# Patient Record
Sex: Male | Born: 1935 | Race: Black or African American | Hispanic: No | Smoking: Former smoker
Health system: Southern US, Community
[De-identification: ages and names within clinical notes are randomized; demographics above are authoritative.]

## PROBLEM LIST (undated history)

## (undated) DIAGNOSIS — D649 Anemia, unspecified: Secondary | ICD-10-CM

## (undated) DIAGNOSIS — M4800 Spinal stenosis, site unspecified: Secondary | ICD-10-CM

## (undated) DIAGNOSIS — M21372 Foot drop, left foot: Secondary | ICD-10-CM

## (undated) DIAGNOSIS — N184 Chronic kidney disease, stage 4 (severe): Secondary | ICD-10-CM

## (undated) DIAGNOSIS — R7989 Other specified abnormal findings of blood chemistry: Secondary | ICD-10-CM

## (undated) DIAGNOSIS — R339 Retention of urine, unspecified: Secondary | ICD-10-CM

## (undated) DIAGNOSIS — M1990 Unspecified osteoarthritis, unspecified site: Secondary | ICD-10-CM

## (undated) DIAGNOSIS — E119 Type 2 diabetes mellitus without complications: Secondary | ICD-10-CM

## (undated) DIAGNOSIS — I10 Essential (primary) hypertension: Secondary | ICD-10-CM

## (undated) DIAGNOSIS — E871 Hypo-osmolality and hyponatremia: Secondary | ICD-10-CM

## (undated) DIAGNOSIS — I495 Sick sinus syndrome: Secondary | ICD-10-CM

## (undated) DIAGNOSIS — Z7901 Long term (current) use of anticoagulants: Secondary | ICD-10-CM

## (undated) DIAGNOSIS — I779 Disorder of arteries and arterioles, unspecified: Secondary | ICD-10-CM

## (undated) DIAGNOSIS — E1142 Type 2 diabetes mellitus with diabetic polyneuropathy: Secondary | ICD-10-CM

## (undated) DIAGNOSIS — J189 Pneumonia, unspecified organism: Secondary | ICD-10-CM

## (undated) DIAGNOSIS — I480 Paroxysmal atrial fibrillation: Secondary | ICD-10-CM

## (undated) DIAGNOSIS — I5032 Chronic diastolic (congestive) heart failure: Secondary | ICD-10-CM

## (undated) DIAGNOSIS — I739 Peripheral vascular disease, unspecified: Secondary | ICD-10-CM

## (undated) DIAGNOSIS — M722 Plantar fascial fibromatosis: Secondary | ICD-10-CM

## (undated) DIAGNOSIS — R269 Unspecified abnormalities of gait and mobility: Secondary | ICD-10-CM

## (undated) DIAGNOSIS — I639 Cerebral infarction, unspecified: Secondary | ICD-10-CM

## (undated) DIAGNOSIS — I119 Hypertensive heart disease without heart failure: Secondary | ICD-10-CM

## (undated) DIAGNOSIS — I272 Pulmonary hypertension, unspecified: Secondary | ICD-10-CM

## (undated) DIAGNOSIS — E785 Hyperlipidemia, unspecified: Secondary | ICD-10-CM

## (undated) DIAGNOSIS — R278 Other lack of coordination: Secondary | ICD-10-CM

## (undated) DIAGNOSIS — Z950 Presence of cardiac pacemaker: Secondary | ICD-10-CM

## (undated) HISTORY — PX: CAROTID ENDARTERECTOMY: SUR193

## (undated) HISTORY — DX: Chronic diastolic (congestive) heart failure: I50.32

## (undated) HISTORY — DX: Hypertensive heart disease without heart failure: I11.9

## (undated) HISTORY — PX: PACEMAKER INSERTION: SHX728

## (undated) HISTORY — DX: Long term (current) use of anticoagulants: Z79.01

## (undated) HISTORY — PX: TOTAL KNEE ARTHROPLASTY: SHX125

## (undated) HISTORY — PX: TRANSURETHRAL RESECTION OF PROSTATE: SHX73

## (undated) HISTORY — DX: Paroxysmal atrial fibrillation: I48.0

## (undated) HISTORY — DX: Unspecified abnormalities of gait and mobility: R26.9

## (undated) HISTORY — DX: Sick sinus syndrome: I49.5

## (undated) HISTORY — DX: Disorder of arteries and arterioles, unspecified: I77.9

## (undated) HISTORY — PX: INSERT / REPLACE / REMOVE PACEMAKER: SUR710

## (undated) HISTORY — DX: Foot drop, left foot: M21.372

## (undated) HISTORY — PX: BACK SURGERY: SHX140

## (undated) HISTORY — DX: Other lack of coordination: R27.8

## (undated) HISTORY — DX: Cerebral infarction, unspecified: I63.9

## (undated) HISTORY — PX: CATARACT EXTRACTION: SUR2

## (undated) HISTORY — DX: Hypo-osmolality and hyponatremia: E87.1

## (undated) HISTORY — DX: Type 2 diabetes mellitus with diabetic polyneuropathy: E11.42

## (undated) HISTORY — PX: TONSILLECTOMY: SUR1361

## (undated) HISTORY — DX: Peripheral vascular disease, unspecified: I73.9

## (undated) HISTORY — PX: QUADRICEPS TENDON REPAIR: SHX756

## (undated) SURGERY — MINOR CAPSULOTOMY
Anesthesia: Choice | Laterality: Right

---

## 1993-05-14 HISTORY — PX: LUMBAR LAMINECTOMY: SHX95

## 1997-11-17 ENCOUNTER — Ambulatory Visit (HOSPITAL_COMMUNITY): Admission: RE | Admit: 1997-11-17 | Discharge: 1997-11-17 | Payer: Self-pay | Admitting: Urology

## 1999-11-02 ENCOUNTER — Ambulatory Visit (HOSPITAL_COMMUNITY): Admission: RE | Admit: 1999-11-02 | Discharge: 1999-11-02 | Payer: Self-pay | Admitting: *Deleted

## 1999-11-02 ENCOUNTER — Encounter: Payer: Self-pay | Admitting: *Deleted

## 1999-11-17 ENCOUNTER — Ambulatory Visit (HOSPITAL_COMMUNITY): Admission: RE | Admit: 1999-11-17 | Discharge: 1999-11-17 | Payer: Self-pay | Admitting: *Deleted

## 1999-11-17 ENCOUNTER — Encounter: Payer: Self-pay | Admitting: *Deleted

## 1999-12-04 ENCOUNTER — Ambulatory Visit (HOSPITAL_COMMUNITY): Admission: RE | Admit: 1999-12-04 | Discharge: 1999-12-04 | Payer: Self-pay | Admitting: *Deleted

## 1999-12-04 ENCOUNTER — Encounter: Payer: Self-pay | Admitting: *Deleted

## 2000-04-15 ENCOUNTER — Encounter: Payer: Self-pay | Admitting: Cardiology

## 2000-04-15 ENCOUNTER — Encounter: Admission: RE | Admit: 2000-04-15 | Discharge: 2000-04-15 | Payer: Self-pay | Admitting: Cardiology

## 2000-11-04 ENCOUNTER — Encounter: Admission: RE | Admit: 2000-11-04 | Discharge: 2001-01-11 | Payer: Self-pay | Admitting: Anesthesiology

## 2001-06-04 ENCOUNTER — Encounter: Payer: Self-pay | Admitting: Urology

## 2001-06-04 ENCOUNTER — Encounter: Admission: RE | Admit: 2001-06-04 | Discharge: 2001-06-04 | Payer: Self-pay | Admitting: Urology

## 2001-12-03 ENCOUNTER — Encounter: Admission: RE | Admit: 2001-12-03 | Discharge: 2002-01-13 | Payer: Self-pay | Admitting: Orthopedic Surgery

## 2002-09-07 ENCOUNTER — Encounter: Payer: Self-pay | Admitting: Neurology

## 2002-09-07 ENCOUNTER — Encounter: Payer: Self-pay | Admitting: Radiology

## 2002-09-07 ENCOUNTER — Encounter: Admission: RE | Admit: 2002-09-07 | Discharge: 2002-09-07 | Payer: Self-pay | Admitting: Neurology

## 2002-09-29 ENCOUNTER — Encounter: Admission: RE | Admit: 2002-09-29 | Discharge: 2002-09-29 | Payer: Self-pay | Admitting: Neurology

## 2002-09-29 ENCOUNTER — Encounter: Payer: Self-pay | Admitting: Neurology

## 2002-10-16 ENCOUNTER — Encounter: Admission: RE | Admit: 2002-10-16 | Discharge: 2002-10-16 | Payer: Self-pay | Admitting: Neurology

## 2002-10-16 ENCOUNTER — Encounter: Payer: Self-pay | Admitting: Neurology

## 2002-10-29 ENCOUNTER — Ambulatory Visit (HOSPITAL_COMMUNITY): Admission: RE | Admit: 2002-10-29 | Discharge: 2002-10-30 | Payer: Self-pay | Admitting: Cardiology

## 2002-10-29 ENCOUNTER — Encounter: Payer: Self-pay | Admitting: Cardiovascular Disease

## 2002-11-01 ENCOUNTER — Emergency Department (HOSPITAL_COMMUNITY): Admission: EM | Admit: 2002-11-01 | Discharge: 2002-11-01 | Payer: Self-pay | Admitting: Emergency Medicine

## 2003-06-11 ENCOUNTER — Encounter: Admission: RE | Admit: 2003-06-11 | Discharge: 2003-06-11 | Payer: Self-pay | Admitting: Neurology

## 2003-06-25 ENCOUNTER — Encounter: Admission: RE | Admit: 2003-06-25 | Discharge: 2003-06-25 | Payer: Self-pay | Admitting: Neurology

## 2003-07-09 ENCOUNTER — Encounter: Admission: RE | Admit: 2003-07-09 | Discharge: 2003-07-09 | Payer: Self-pay | Admitting: Neurology

## 2003-10-28 ENCOUNTER — Encounter: Admission: RE | Admit: 2003-10-28 | Discharge: 2003-10-28 | Payer: Self-pay | Admitting: Neurology

## 2003-10-30 ENCOUNTER — Emergency Department (HOSPITAL_COMMUNITY): Admission: EM | Admit: 2003-10-30 | Discharge: 2003-10-30 | Payer: Self-pay | Admitting: Emergency Medicine

## 2003-11-19 ENCOUNTER — Encounter: Admission: RE | Admit: 2003-11-19 | Discharge: 2003-11-19 | Payer: Self-pay | Admitting: Neurology

## 2003-12-13 ENCOUNTER — Inpatient Hospital Stay (HOSPITAL_COMMUNITY): Admission: RE | Admit: 2003-12-13 | Discharge: 2003-12-20 | Payer: Self-pay | Admitting: Orthopedic Surgery

## 2003-12-20 ENCOUNTER — Inpatient Hospital Stay (HOSPITAL_COMMUNITY)
Admission: RE | Admit: 2003-12-20 | Discharge: 2003-12-28 | Payer: Self-pay | Admitting: Physical Medicine & Rehabilitation

## 2004-01-18 ENCOUNTER — Inpatient Hospital Stay (HOSPITAL_COMMUNITY): Admission: RE | Admit: 2004-01-18 | Discharge: 2004-01-20 | Payer: Self-pay | Admitting: Urology

## 2004-01-18 ENCOUNTER — Encounter (INDEPENDENT_AMBULATORY_CARE_PROVIDER_SITE_OTHER): Payer: Self-pay | Admitting: *Deleted

## 2004-02-11 ENCOUNTER — Encounter: Admission: RE | Admit: 2004-02-11 | Discharge: 2004-02-11 | Payer: Self-pay | Admitting: Neurology

## 2004-04-21 ENCOUNTER — Encounter: Admission: RE | Admit: 2004-04-21 | Discharge: 2004-04-21 | Payer: Self-pay | Admitting: Neurology

## 2004-05-14 HISTORY — PX: CAROTID ENDARTERECTOMY: SUR193

## 2004-05-31 ENCOUNTER — Inpatient Hospital Stay (HOSPITAL_COMMUNITY): Admission: EM | Admit: 2004-05-31 | Discharge: 2004-06-03 | Payer: Self-pay | Admitting: Emergency Medicine

## 2004-09-26 ENCOUNTER — Encounter: Admission: RE | Admit: 2004-09-26 | Discharge: 2004-09-26 | Payer: Self-pay | Admitting: Neurology

## 2004-09-27 ENCOUNTER — Encounter: Admission: RE | Admit: 2004-09-27 | Discharge: 2004-12-26 | Payer: Self-pay | Admitting: Neurology

## 2004-10-13 ENCOUNTER — Encounter: Admission: RE | Admit: 2004-10-13 | Discharge: 2004-10-13 | Payer: Self-pay | Admitting: Neurology

## 2004-12-01 ENCOUNTER — Encounter: Admission: RE | Admit: 2004-12-01 | Discharge: 2004-12-01 | Payer: Self-pay | Admitting: Neurology

## 2005-01-09 ENCOUNTER — Encounter: Admission: RE | Admit: 2005-01-09 | Discharge: 2005-02-06 | Payer: Self-pay | Admitting: Neurology

## 2005-02-13 ENCOUNTER — Ambulatory Visit: Payer: Self-pay | Admitting: Oncology

## 2005-02-13 ENCOUNTER — Encounter: Admission: RE | Admit: 2005-02-13 | Discharge: 2005-05-14 | Payer: Self-pay | Admitting: Neurology

## 2005-03-16 ENCOUNTER — Encounter: Admission: RE | Admit: 2005-03-16 | Discharge: 2005-03-16 | Payer: Self-pay | Admitting: Neurology

## 2005-03-30 ENCOUNTER — Encounter: Admission: RE | Admit: 2005-03-30 | Discharge: 2005-03-30 | Payer: Self-pay | Admitting: Neurology

## 2005-04-16 ENCOUNTER — Ambulatory Visit: Payer: Self-pay | Admitting: Oncology

## 2005-05-22 ENCOUNTER — Ambulatory Visit (HOSPITAL_COMMUNITY): Admission: RE | Admit: 2005-05-22 | Discharge: 2005-05-22 | Payer: Self-pay | Admitting: Ophthalmology

## 2005-06-06 ENCOUNTER — Ambulatory Visit: Payer: Self-pay | Admitting: Oncology

## 2005-07-19 ENCOUNTER — Encounter: Admission: RE | Admit: 2005-07-19 | Discharge: 2005-07-19 | Payer: Self-pay | Admitting: Cardiology

## 2005-08-09 ENCOUNTER — Ambulatory Visit: Payer: Self-pay | Admitting: Oncology

## 2005-09-06 LAB — CBC WITH DIFFERENTIAL/PLATELET
BASO%: 0.5 % (ref 0.0–2.0)
EOS%: 3 % (ref 0.0–7.0)
LYMPH%: 28.9 % (ref 14.0–48.0)
MCH: 29.9 pg (ref 28.0–33.4)
MCHC: 33.1 g/dL (ref 32.0–35.9)
MCV: 90.5 fL (ref 81.6–98.0)
MONO%: 12.3 % (ref 0.0–13.0)
NEUT#: 2.8 10*3/uL (ref 1.5–6.5)
RBC: 4.38 10*6/uL (ref 4.20–5.71)
RDW: 15.7 % — ABNORMAL HIGH (ref 11.2–14.6)

## 2005-09-14 ENCOUNTER — Encounter (INDEPENDENT_AMBULATORY_CARE_PROVIDER_SITE_OTHER): Payer: Self-pay | Admitting: Specialist

## 2005-09-14 ENCOUNTER — Inpatient Hospital Stay (HOSPITAL_COMMUNITY): Admission: RE | Admit: 2005-09-14 | Discharge: 2005-09-17 | Payer: Self-pay | Admitting: *Deleted

## 2005-10-02 ENCOUNTER — Ambulatory Visit: Payer: Self-pay | Admitting: Oncology

## 2005-10-04 LAB — CBC WITH DIFFERENTIAL/PLATELET
BASO%: 0.6 % (ref 0.0–2.0)
HCT: 32.5 % — ABNORMAL LOW (ref 38.7–49.9)
HGB: 10.9 g/dL — ABNORMAL LOW (ref 13.0–17.1)
MCHC: 33.7 g/dL (ref 32.0–35.9)
MONO#: 0.5 10*3/uL (ref 0.1–0.9)
NEUT%: 60.6 % (ref 40.0–75.0)
RDW: 13.8 % (ref 11.2–14.6)
WBC: 5 10*3/uL (ref 4.0–10.0)
lymph#: 1.3 10*3/uL (ref 0.9–3.3)

## 2005-10-04 LAB — IRON AND TIBC
Iron: 70 ug/dL (ref 42–165)
TIBC: 256 ug/dL (ref 215–435)
UIBC: 186 ug/dL

## 2005-10-18 ENCOUNTER — Encounter: Admission: RE | Admit: 2005-10-18 | Discharge: 2005-10-18 | Payer: Self-pay | Admitting: *Deleted

## 2005-10-26 ENCOUNTER — Ambulatory Visit: Admission: RE | Admit: 2005-10-26 | Discharge: 2005-10-26 | Payer: Self-pay | Admitting: *Deleted

## 2005-10-29 ENCOUNTER — Encounter: Admission: RE | Admit: 2005-10-29 | Discharge: 2005-10-29 | Payer: Self-pay | Admitting: Neurology

## 2005-11-20 ENCOUNTER — Emergency Department (HOSPITAL_COMMUNITY): Admission: EM | Admit: 2005-11-20 | Discharge: 2005-11-20 | Payer: Self-pay | Admitting: Emergency Medicine

## 2005-11-26 ENCOUNTER — Ambulatory Visit: Payer: Self-pay | Admitting: Oncology

## 2005-11-27 ENCOUNTER — Emergency Department (HOSPITAL_COMMUNITY): Admission: EM | Admit: 2005-11-27 | Discharge: 2005-11-27 | Payer: Self-pay | Admitting: Emergency Medicine

## 2005-12-14 ENCOUNTER — Encounter: Admission: RE | Admit: 2005-12-14 | Discharge: 2005-12-14 | Payer: Self-pay | Admitting: Neurology

## 2006-01-02 ENCOUNTER — Ambulatory Visit (HOSPITAL_COMMUNITY): Admission: RE | Admit: 2006-01-02 | Discharge: 2006-01-02 | Payer: Self-pay | Admitting: Neurosurgery

## 2006-01-18 ENCOUNTER — Ambulatory Visit (HOSPITAL_COMMUNITY): Admission: RE | Admit: 2006-01-18 | Discharge: 2006-01-18 | Payer: Self-pay | Admitting: Neurosurgery

## 2006-01-22 ENCOUNTER — Ambulatory Visit: Payer: Self-pay | Admitting: Oncology

## 2006-02-08 ENCOUNTER — Emergency Department (HOSPITAL_COMMUNITY): Admission: EM | Admit: 2006-02-08 | Discharge: 2006-02-08 | Payer: Self-pay | Admitting: Emergency Medicine

## 2006-02-25 ENCOUNTER — Encounter: Admission: RE | Admit: 2006-02-25 | Discharge: 2006-02-25 | Payer: Self-pay | Admitting: Neurosurgery

## 2007-04-17 ENCOUNTER — Ambulatory Visit: Payer: Self-pay | Admitting: *Deleted

## 2007-04-18 ENCOUNTER — Emergency Department (HOSPITAL_COMMUNITY): Admission: EM | Admit: 2007-04-18 | Discharge: 2007-04-18 | Payer: Self-pay | Admitting: Emergency Medicine

## 2008-02-02 ENCOUNTER — Encounter: Admission: RE | Admit: 2008-02-02 | Discharge: 2008-02-02 | Payer: Self-pay | Admitting: Neurology

## 2008-05-20 ENCOUNTER — Ambulatory Visit: Payer: Self-pay | Admitting: *Deleted

## 2009-06-10 ENCOUNTER — Ambulatory Visit: Payer: Self-pay | Admitting: Vascular Surgery

## 2009-07-22 ENCOUNTER — Encounter: Admission: RE | Admit: 2009-07-22 | Discharge: 2009-07-22 | Payer: Self-pay | Admitting: Neurology

## 2009-07-29 ENCOUNTER — Ambulatory Visit (HOSPITAL_COMMUNITY)
Admission: RE | Admit: 2009-07-29 | Discharge: 2009-07-29 | Payer: Self-pay | Source: Home / Self Care | Admitting: Internal Medicine

## 2009-12-09 ENCOUNTER — Observation Stay (HOSPITAL_COMMUNITY): Admission: RE | Admit: 2009-12-09 | Discharge: 2009-12-10 | Payer: Self-pay | Admitting: Cardiology

## 2009-12-09 HISTORY — PX: PACEMAKER GENERATOR CHANGE: SHX5998

## 2010-01-05 ENCOUNTER — Encounter: Admission: RE | Admit: 2010-01-05 | Discharge: 2010-01-05 | Payer: Self-pay | Admitting: Neurology

## 2010-03-11 ENCOUNTER — Emergency Department (HOSPITAL_COMMUNITY): Admission: EM | Admit: 2010-03-11 | Discharge: 2010-03-11 | Payer: Self-pay | Admitting: Emergency Medicine

## 2010-03-17 ENCOUNTER — Emergency Department (HOSPITAL_COMMUNITY): Admission: EM | Admit: 2010-03-17 | Discharge: 2010-03-17 | Payer: Self-pay | Admitting: Emergency Medicine

## 2010-03-17 ENCOUNTER — Encounter (INDEPENDENT_AMBULATORY_CARE_PROVIDER_SITE_OTHER): Payer: Self-pay | Admitting: Emergency Medicine

## 2010-03-17 ENCOUNTER — Ambulatory Visit: Payer: Self-pay | Admitting: Vascular Surgery

## 2010-03-22 ENCOUNTER — Emergency Department (HOSPITAL_COMMUNITY): Admission: EM | Admit: 2010-03-22 | Discharge: 2010-03-22 | Payer: Self-pay | Admitting: Emergency Medicine

## 2010-04-24 ENCOUNTER — Inpatient Hospital Stay (HOSPITAL_COMMUNITY)
Admission: EM | Admit: 2010-04-24 | Discharge: 2010-04-27 | Payer: Self-pay | Source: Home / Self Care | Attending: Interventional Cardiology | Admitting: Interventional Cardiology

## 2010-04-25 ENCOUNTER — Encounter (INDEPENDENT_AMBULATORY_CARE_PROVIDER_SITE_OTHER): Payer: Self-pay | Admitting: Interventional Cardiology

## 2010-05-24 ENCOUNTER — Inpatient Hospital Stay: Admission: RE | Admit: 2010-05-24 | Payer: Self-pay | Source: Home / Self Care | Admitting: Neurosurgery

## 2010-06-04 ENCOUNTER — Encounter: Payer: Self-pay | Admitting: Neurosurgery

## 2010-06-04 ENCOUNTER — Encounter: Payer: Self-pay | Admitting: Neurology

## 2010-07-01 ENCOUNTER — Inpatient Hospital Stay (HOSPITAL_COMMUNITY)
Admission: EM | Admit: 2010-07-01 | Discharge: 2010-07-02 | DRG: 641 | Disposition: A | Discharge status: Home / Self Care | Payer: Medicare Other | Attending: Internal Medicine | Admitting: Internal Medicine

## 2010-07-01 ENCOUNTER — Inpatient Hospital Stay (HOSPITAL_COMMUNITY): Payer: Medicare Other

## 2010-07-01 DIAGNOSIS — N401 Enlarged prostate with lower urinary tract symptoms: Secondary | ICD-10-CM | POA: Diagnosis present

## 2010-07-01 DIAGNOSIS — Z7982 Long term (current) use of aspirin: Secondary | ICD-10-CM

## 2010-07-01 DIAGNOSIS — Z794 Long term (current) use of insulin: Secondary | ICD-10-CM

## 2010-07-01 DIAGNOSIS — T502X5A Adverse effect of carbonic-anhydrase inhibitors, benzothiadiazides and other diuretics, initial encounter: Secondary | ICD-10-CM | POA: Diagnosis present

## 2010-07-01 DIAGNOSIS — Z79899 Other long term (current) drug therapy: Secondary | ICD-10-CM

## 2010-07-01 DIAGNOSIS — I5032 Chronic diastolic (congestive) heart failure: Secondary | ICD-10-CM | POA: Diagnosis present

## 2010-07-01 DIAGNOSIS — I10 Essential (primary) hypertension: Secondary | ICD-10-CM | POA: Diagnosis present

## 2010-07-01 DIAGNOSIS — I509 Heart failure, unspecified: Secondary | ICD-10-CM | POA: Diagnosis present

## 2010-07-01 DIAGNOSIS — N138 Other obstructive and reflux uropathy: Secondary | ICD-10-CM | POA: Diagnosis present

## 2010-07-01 DIAGNOSIS — E785 Hyperlipidemia, unspecified: Secondary | ICD-10-CM | POA: Diagnosis present

## 2010-07-01 DIAGNOSIS — I6529 Occlusion and stenosis of unspecified carotid artery: Secondary | ICD-10-CM | POA: Diagnosis present

## 2010-07-01 DIAGNOSIS — E871 Hypo-osmolality and hyponatremia: Principal | ICD-10-CM | POA: Diagnosis present

## 2010-07-01 DIAGNOSIS — Z950 Presence of cardiac pacemaker: Secondary | ICD-10-CM

## 2010-07-01 DIAGNOSIS — N320 Bladder-neck obstruction: Secondary | ICD-10-CM | POA: Diagnosis present

## 2010-07-01 DIAGNOSIS — E269 Hyperaldosteronism, unspecified: Secondary | ICD-10-CM | POA: Diagnosis present

## 2010-07-01 DIAGNOSIS — E119 Type 2 diabetes mellitus without complications: Secondary | ICD-10-CM | POA: Diagnosis present

## 2010-07-01 LAB — POCT CARDIAC MARKERS
Myoglobin, poc: 128 ng/mL (ref 12–200)
Troponin i, poc: <0.05 ng/mL (ref 0.00–0.09)

## 2010-07-01 LAB — DIFFERENTIAL
Eosinophils Relative: 4 % (ref 0–5)
Lymphocytes Relative: 38 % (ref 12–46)
Lymphs Abs: 1.8 10*3/uL (ref 0.7–4.0)
Monocytes Absolute: 0.7 10*3/uL (ref 0.1–1.0)
Monocytes Relative: 14 % — ABNORMAL HIGH (ref 3–12)
Neutro Abs: 2.1 10*3/uL (ref 1.7–7.7)

## 2010-07-01 LAB — BASIC METABOLIC PANEL
BUN: 13 mg/dL (ref 6–23)
Chloride: 91 mEq/L — ABNORMAL LOW (ref 96–112)
Creatinine, Ser: 1.41 mg/dL (ref 0.4–1.5)
GFR calc Af Amer: 57 mL/min — ABNORMAL LOW (ref >60)
GFR calc non Af Amer: 47 mL/min — ABNORMAL LOW (ref >60)

## 2010-07-01 LAB — CBC
HCT: 29.6 % — ABNORMAL LOW (ref 39.0–52.0)
Hemoglobin: 10.4 g/dL — ABNORMAL LOW (ref 13.0–17.0)
MCHC: 35.1 g/dL (ref 30.0–36.0)
MCV: 87.1 fL (ref 78.0–100.0)
RDW: 12.9 % (ref 11.5–15.5)

## 2010-07-01 LAB — URINALYSIS, ROUTINE W REFLEX MICROSCOPIC
Bilirubin Urine: NEGATIVE
Hgb urine dipstick: NEGATIVE
Protein, ur: NEGATIVE mg/dL
Urine Glucose, Fasting: 100 mg/dL — AB
Urobilinogen, UA: 0.2 mg/dL (ref 0.0–1.0)

## 2010-07-01 LAB — POCT I-STAT, CHEM 8
Calcium, Ion: 1.12 mmol/L (ref 1.12–1.32)
Chloride: 92 mEq/L — ABNORMAL LOW (ref 96–112)
Glucose, Bld: 225 mg/dL — ABNORMAL HIGH (ref 70–99)
HCT: 32 % — ABNORMAL LOW (ref 39.0–52.0)
Potassium: 4.8 mEq/L (ref 3.5–5.1)

## 2010-07-01 LAB — OSMOLALITY, URINE: Osmolality, Ur: 297 mOsm/kg — ABNORMAL LOW (ref 390–1090)

## 2010-07-01 LAB — OSMOLALITY: Osmolality: 259 mOsm/kg — ABNORMAL LOW (ref 275–300)

## 2010-07-01 LAB — GLUCOSE, CAPILLARY
Glucose-Capillary: 154 mg/dL — ABNORMAL HIGH (ref 70–99)
Glucose-Capillary: 271 mg/dL — ABNORMAL HIGH (ref 70–99)
Glucose-Capillary: 281 mg/dL — ABNORMAL HIGH (ref 70–99)

## 2010-07-02 LAB — GLUCOSE, CAPILLARY: Glucose-Capillary: 123 mg/dL — ABNORMAL HIGH (ref 70–99)

## 2010-07-02 LAB — COMPREHENSIVE METABOLIC PANEL
ALT: 17 U/L (ref 0–53)
BUN: 16 mg/dL (ref 6–23)
CO2: 28 mEq/L (ref 19–32)
Calcium: 9.4 mg/dL (ref 8.4–10.5)
Creatinine, Ser: 1.48 mg/dL (ref 0.4–1.5)
GFR calc non Af Amer: 45 mL/min — ABNORMAL LOW (ref >60)
Glucose, Bld: 132 mg/dL — ABNORMAL HIGH (ref 70–99)
Sodium: 129 mEq/L — ABNORMAL LOW (ref 135–145)
Total Protein: 6.3 g/dL (ref 6.0–8.3)

## 2010-07-02 LAB — CBC
HCT: 29.5 % — ABNORMAL LOW (ref 39.0–52.0)
Hemoglobin: 10.1 g/dL — ABNORMAL LOW (ref 13.0–17.0)
MCV: 86.5 fL (ref 78.0–100.0)
RBC: 3.41 MIL/uL — ABNORMAL LOW (ref 4.22–5.81)
RDW: 13.1 % (ref 11.5–15.5)
WBC: 5.3 10*3/uL (ref 4.0–10.5)

## 2010-07-03 LAB — GLUCOSE, CAPILLARY: Glucose-Capillary: 227 mg/dL — ABNORMAL HIGH (ref 70–99)

## 2010-07-03 NOTE — Discharge Summary (Signed)
[name], SANGALANG NO.:  [id number]  MEDICAL RECORD NO.:  [id number]           PATIENT TYPE:  I  LOCATION:  4734                         FACILITY:  [facility]  PHYSICIAN:  Isidor Holts, M.D.  DATE OF BIRTH:  01-03-1919  DATE OF ADMISSION:  07/01/2010 DATE OF DISCHARGE:  07/02/2010                              DISCHARGE SUMMARY   PRIMARY CARE PHYSICIAN:  Margaretmary Bayley, MD  PRIMARY CARDIOLOGIST:  Lyn Records, MD  PRIMARY UROLOGIST:  Lindaann Slough, MD  DISCHARGE DIAGNOSES: 1. Symptomatic hyponatremia, likely due to secondary hyperaldosteronism and     diuretic therapy. 2. History of diastolic congestive heart failure. 3. Hypertension. 4. History of complete heart block status post permanent pacer in     1993. 5. History of carotid stenosis status post left carotid     endarterectomy. 6. History of benign prostatic hypertrophy status post transurethral     resection of prostate. 7. Type 2 diabetes mellitus, insulin-requiring. 8. Dyslipidemia. 9. History of spinal stenosis status post laminectomy.  DISCHARGE MEDICATIONS: 1. Furosemide 40 mg p.o. q.a.m. (was on 80 mg alternating with 40 mg     p.o. q.a.m.) 2. Amlodipine 10 mg p.o. q.a.m. 3. Aspirin enteric-coated 81 mg p.o. b.i.d. 4. Avodart 0.5 mg p.o. daily. 5. Calcium citrate 500 mg p.o. q.a.m. 6. Clonidine 0.1 mg p.o. b.i.d. 7. Diovan 160 mg p.o. b.i.d. 8. Fish oil 1 g p.o. daily. 9. Humalog insulin 4 units subcutaneously after breakfast and lunch     and 6 units subcutaneously after supper. 10.Isosorbide mononitrate XR 30 mg p.o. daily. 11.Lantus 18 units subcutaneously at bedtime. 12.Lorazepam 0.25 mg p.o. b.i.d. 13.MiraLax 17 g p.o. daily. 14.Multivitamin for diabetics OTC 1 tablet p.o. q.a.m. 15.Potassium chloride 10 mEq p.o. daily. 16.Systane eye drops OTC 1 drop each eye daily. 17.Timolol ophthalmic solution 0.5% one drop each eye daily. 18.Vicodin ES (7.5/750) 1 tablet p.o. p.r.n.  q.6 h. for pain. 19.Vitamin D3 1000 units p.o. daily. 20.Rapaflo 8 mg p.o. daily.  PROCEDURES: 1. Chest x-ray, July 01, 2010.  This showed mild pulmonary     vascular congestion without frank edema, minimal bibasilar     atelectases.  CONSULTATIONS:  None.  ADMISSION HISTORY:  As in H and P notes of July 01, 2010 dictated by Dr. Fran Lowes.  However, in brief, this is an 77 year old male with known history of insulin-requiring type 2 diabetes mellitus, hypertension, dyslipidemia, diastolic congestive heart failure, history of complete heart block status post permanent pacer in 1993, carotid stenosis status post left carotid endarterectomy, BPH status post TURP history of spinal stenosis status post laminectomy, known history of chronic mild hyponatremia, presenting with progressive nausea and weakness as well as leg cramping.  On initial evaluation, he was found to have a sodium level of 124, glucose was 225, BUN 14, creatinine 1.3. He was admitted for further evaluation, investigation, and management for symptomatic hyponatremia.  CLINICAL COURSE: 1. Hyponatremia.  Although the patient was initially commenced on     gentle intravenous fluid hydration with normal saline, on suspicion     of overdiuresis secondary to Lasix which had recently  been 80 mg     per day, then subsequently modified to 80 mg and 40 mg on     alternating days, further review of the patient's renal indices     indicated a BUN of 14, creatinine 1.3 which did not appear     consistent with dehydration and the patient also clinically     appeared euvolemic.  It is felt that the patient's hyponatremia is     likely secondary to secondary hyperaldosteronism due to his     congestive heart failure and that the acute diminution may have     been secondary to his diuretic therapy.  Review of the patient's     electronic medical record showed that he has chronically been plagued with     hyponatremia.  Blood  sodium levels havehistorically been of the order of between 129     to 133.  Intravenous saline was discontinued in a.m. of July 01, 2010.  Chest x-ray at that time showed mild pulmonary vascular     congestion but no overt congestive heart failure.  The patient did     receive a single intravenous dose of Lasix at that time and Lasix     was therefore reduced to 40 mg p.o. daily.  He was placed on fluid     restriction of 1200 mL per day.  Clinical response was     satisfactory.  By July 02, 2010, his sodium level was 129.  The     patient was asymptomatic. Of note serum osmolality was 259, with     urine osmolality of 297, ruling out SIADH.  2. Chronic diastolic congestive heart failure.  This is a known     condition in this patient.  His 2-D echocardiogram of April 25, 2010 showed an ejection fraction of 50% to 55% with also severe     concentric hypertrophy.  Apart from above-mentioned mild pulmonary     congestion that is on chest x-ray of February, 18, 2012, the     patient showed no overt clinical signs of congestive heart failure     during the course of this hospitalization.  His BNP which was only minimally elevated at 145 on July 01, 2010 and was entirely normal     at 31 on July 02, 2010. Furthermore, the patient had no symptoms of     shortness of breath or chest pain.  3. Hypertension.  The patient remained normotensive on preadmission     antihypertensive medications.  4. Benign prostatic hypertrophy.  The patient was continued on     preadmission urologic medications and he had no symptoms of     prostatism during this hospitalization.  5. Insulin-requiring type 2 diabetes mellitus.  This was managed with     a combination of scheduled Lantus insulin, sliding scale insulin     coverage, and appropriate diet.  As of a.m. of July 02, 2010,     the patient's CBG was 123.  6. Dyslipidemia.  The patient continued on fish oil on  preadmission     doses.  DISPOSITION:  The patient on July 02, 2009 was asymptomatic, felt back to his usual self, was very keen to be discharged.  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  He was therefore discharged accordingly on  July 02, 2010.  ACTIVITY:  As tolerated.  Recommended to increase activity slowly.  DIET:  Heart-healthy/carbohydrate modified.  FOLLOWUP INSTRUCTIONS:  The patient is to follow up with his primary MD, Dr. Margaretmary Bayley within 1-2 weeks.  He has been instructed to call for an appointment.  In addition, he is to follow up with his primary cardiologist Dr. Verdis Prime this week.  The patient has assured me that he will request an appointment.  SPECIAL INSTRUCTIONS:  The patient has been recommended fluid restriction of 1200 mL per day.  He is also recommended to have his serum electrolytes checked in 2-3 days by either his primary MD or his primary cardiologist.  The patient is agreeable to this plan and has verbalized understanding.     Isidor Holts, M.D.     CO/MEDQ  D:  07/02/2010  T:  07/02/2010  Job:  [id number]  cc:   Margaretmary Bayley, M.D. Lyn Records, M.D. Lindaann Slough, M.D.  Electronically Signed by Isidor Holts M.D. on 07/03/2010 08:09:30 AM

## 2010-07-05 ENCOUNTER — Emergency Department (HOSPITAL_COMMUNITY): Payer: Medicare Other

## 2010-07-05 ENCOUNTER — Inpatient Hospital Stay (HOSPITAL_COMMUNITY)
Admission: EM | Admit: 2010-07-05 | Discharge: 2010-07-08 | DRG: 194 | Disposition: A | Discharge status: Home / Self Care | Payer: Medicare Other | Attending: Internal Medicine | Admitting: Internal Medicine

## 2010-07-05 DIAGNOSIS — Z950 Presence of cardiac pacemaker: Secondary | ICD-10-CM

## 2010-07-05 DIAGNOSIS — E236 Other disorders of pituitary gland: Secondary | ICD-10-CM | POA: Diagnosis present

## 2010-07-05 DIAGNOSIS — J189 Pneumonia, unspecified organism: Principal | ICD-10-CM | POA: Diagnosis present

## 2010-07-05 DIAGNOSIS — I10 Essential (primary) hypertension: Secondary | ICD-10-CM | POA: Diagnosis present

## 2010-07-05 DIAGNOSIS — D509 Iron deficiency anemia, unspecified: Secondary | ICD-10-CM | POA: Diagnosis present

## 2010-07-05 DIAGNOSIS — E119 Type 2 diabetes mellitus without complications: Secondary | ICD-10-CM | POA: Diagnosis present

## 2010-07-05 LAB — CBC
HCT: 31.2 % — ABNORMAL LOW (ref 39.0–52.0)
HCT: 29.2 % — ABNORMAL LOW (ref 39.0–52.0)
Hemoglobin: 11 g/dL — ABNORMAL LOW (ref 13.0–17.0)
Hemoglobin: 10 g/dL — ABNORMAL LOW (ref 13.0–17.0)
MCH: 31.2 pg (ref 26.0–34.0)
MCH: 30.2 pg (ref 26.0–34.0)
MCHC: 35.3 g/dL (ref 30.0–36.0)
MCHC: 34.2 g/dL (ref 30.0–36.0)
MCV: 88.4 fL (ref 78.0–100.0)
MCV: 88.2 fL (ref 78.0–100.0)
Platelets: 241 K/uL (ref 150–400)
Platelets: 234 K/uL (ref 150–400)
RBC: 3.53 MIL/uL — ABNORMAL LOW (ref 4.22–5.81)
RBC: 3.31 MIL/uL — ABNORMAL LOW (ref 4.22–5.81)
RDW: 12.8 % (ref 11.5–15.5)
RDW: 12.9 % (ref 11.5–15.5)
WBC: 8.9 K/uL (ref 4.0–10.5)
WBC: 6.8 K/uL (ref 4.0–10.5)

## 2010-07-05 LAB — SODIUM, URINE, RANDOM: Sodium, Ur: 29 mEq/L

## 2010-07-05 LAB — BASIC METABOLIC PANEL WITH GFR
BUN: 18 mg/dL (ref 6–23)
BUN: 16 mg/dL (ref 6–23)
CO2: 25 meq/L (ref 19–32)
CO2: 26 meq/L (ref 19–32)
Calcium: 9.2 mg/dL (ref 8.4–10.5)
Calcium: 8.9 mg/dL (ref 8.4–10.5)
Chloride: 86 meq/L — ABNORMAL LOW (ref 96–112)
Chloride: 88 meq/L — ABNORMAL LOW (ref 96–112)
Creatinine, Ser: 1.22 mg/dL (ref 0.4–1.5)
Creatinine, Ser: 1.1 mg/dL (ref 0.4–1.5)
GFR calc non Af Amer: 56 mL/min — ABNORMAL LOW
GFR calc non Af Amer: >60 mL/min
Glucose, Bld: 259 mg/dL — ABNORMAL HIGH (ref 70–99)
Glucose, Bld: 217 mg/dL — ABNORMAL HIGH (ref 70–99)
Potassium: 4.9 meq/L (ref 3.5–5.1)
Potassium: 4.5 meq/L (ref 3.5–5.1)
Sodium: 119 meq/L — CL (ref 135–145)
Sodium: 120 meq/L — ABNORMAL LOW (ref 135–145)

## 2010-07-05 LAB — URINALYSIS, ROUTINE W REFLEX MICROSCOPIC
Hgb urine dipstick: NEGATIVE
Leukocytes, UA: NEGATIVE
Protein, ur: 30 mg/dL — AB
Specific Gravity, Urine: 1.018 (ref 1.005–1.030)
Urine Glucose, Fasting: NEGATIVE mg/dL
pH: 6.5 (ref 5.0–8.0)

## 2010-07-05 LAB — GLUCOSE, CAPILLARY
Glucose-Capillary: 212 mg/dL — ABNORMAL HIGH (ref 70–99)
Glucose-Capillary: 158 mg/dL — ABNORMAL HIGH (ref 70–99)
Glucose-Capillary: 182 mg/dL — ABNORMAL HIGH (ref 70–99)

## 2010-07-05 LAB — DIFFERENTIAL
Basophils Absolute: 0 K/uL (ref 0.0–0.1)
Basophils Relative: 0 % (ref 0–1)
Eosinophils Absolute: 0.4 K/uL (ref 0.0–0.7)
Eosinophils Relative: 4 % (ref 0–5)
Lymphocytes Relative: 12 % (ref 12–46)
Lymphs Abs: 1.1 K/uL (ref 0.7–4.0)
Monocytes Absolute: 1.2 K/uL — ABNORMAL HIGH (ref 0.1–1.0)
Monocytes Relative: 14 % — ABNORMAL HIGH (ref 3–12)
Neutro Abs: 6.2 K/uL (ref 1.7–7.7)
Neutrophils Relative %: 70 % (ref 43–77)

## 2010-07-05 LAB — IRON AND TIBC
Iron: 17 ug/dL — ABNORMAL LOW (ref 42–135)
Saturation Ratios: 9 % — ABNORMAL LOW (ref 20–55)
TIBC: 199 ug/dL — ABNORMAL LOW (ref 215–435)
UIBC: 182 ug/dL

## 2010-07-05 LAB — CHLORIDE, URINE, RANDOM: Chloride Urine: 47 mEq/L

## 2010-07-05 LAB — OSMOLALITY, URINE: Osmolality, Ur: 453 mOsm/kg (ref 390–1090)

## 2010-07-05 LAB — MAGNESIUM: Magnesium: 1.9 mg/dL (ref 1.5–2.5)

## 2010-07-05 LAB — BRAIN NATRIURETIC PEPTIDE: Pro B Natriuretic peptide (BNP): 364 pg/mL — ABNORMAL HIGH (ref 0.0–100.0)

## 2010-07-05 LAB — URINE MICROSCOPIC-ADD ON

## 2010-07-05 LAB — LACTIC ACID, PLASMA: Lactic Acid, Venous: 1.6 mmol/L (ref 0.5–2.2)

## 2010-07-06 LAB — BASIC METABOLIC PANEL
CO2: 28 mEq/L (ref 19–32)
Calcium: 8.8 mg/dL (ref 8.4–10.5)
Chloride: 91 mEq/L — ABNORMAL LOW (ref 96–112)
Creatinine, Ser: 1.11 mg/dL (ref 0.4–1.5)
Glucose, Bld: 175 mg/dL — ABNORMAL HIGH (ref 70–99)

## 2010-07-06 LAB — URINE CULTURE: Special Requests: NEGATIVE

## 2010-07-06 LAB — GLUCOSE, CAPILLARY: Glucose-Capillary: 264 mg/dL — ABNORMAL HIGH (ref 70–99)

## 2010-07-07 LAB — BASIC METABOLIC PANEL
BUN: 12 mg/dL (ref 6–23)
Chloride: 92 mEq/L — ABNORMAL LOW (ref 96–112)
Glucose, Bld: 218 mg/dL — ABNORMAL HIGH (ref 70–99)
Potassium: 3.9 mEq/L (ref 3.5–5.1)

## 2010-07-07 LAB — GLUCOSE, CAPILLARY
Glucose-Capillary: 135 mg/dL — ABNORMAL HIGH (ref 70–99)
Glucose-Capillary: 166 mg/dL — ABNORMAL HIGH (ref 70–99)

## 2010-07-08 LAB — RENAL FUNCTION PANEL
BUN: 11 mg/dL (ref 6–23)
Calcium: 8.9 mg/dL (ref 8.4–10.5)
Creatinine, Ser: 0.97 mg/dL (ref 0.4–1.5)
Glucose, Bld: 174 mg/dL — ABNORMAL HIGH (ref 70–99)
Phosphorus: 3.2 mg/dL (ref 2.3–4.6)

## 2010-07-10 NOTE — Consult Note (Signed)
[name], Darrell NO.:  [name]  MEDICAL RECORD NO.:  [name]           PATIENT TYPE:  I  LOCATION:  1430                         FACILITY:  [facility]  PHYSICIAN:  Darrell Bathe, MD      DATE OF BIRTH:  Sep 15, 1918  DATE OF CONSULTATION:  07/05/2010 DATE OF DISCHARGE:                                CONSULTATION   CARDIOLOGIST:  Darrell Records, MD  NEPHROLOGIST:  Darrell Slicker. Lowell Guitar, MD.  UROLOGISTLindaann [name], M.D.  PRIMARY PHYSICIAN:  Darrell Bayley, MD  REASON FOR CONSULTATION:  Chronic diastolic heart failure, pacemaker, severe hyponatremia in the setting currently of fever/pneumonia.  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  Fever was noted to be 103 at home, and he himself stated that "I have pneumonia."  This history was given by his daughter in the room.  He currently is resting in bed, but arousable and able to answer a few questions then falls back asleep.  He currently denies any significant shortness of breath, chest pain.  Family reports no recent syncopal episodes, dizziness, nausea, vomiting, diarrhea, rashes, or neurologic symptoms.  Recently, he was in the hospital overnight from July 01, 2010 to July 02, 2010, on the hospitalist service and discharged with symptomatic hyponatremia.  Initially, sodium level was 124 and has historically been between 129 and 133.  His serum osmolarity at that time was 259 with a serum osmolarity of 297.  Clinically, Dr. Katrinka [name] had him on a rotating basis of Lasix 40 mg 1 day and 80 mg the next.  Dr. Casimiro [name], his nephrologist per report from daughter had tried to increase the Lasix to 120 mg, but his  daughter states that he did not tolerate this secondary to feeling lethargic or tired.  As of note, he had a recent hospitalization due to acute diastolic heart failure requiring diuresis in mid December.  PAST MEDICAL HISTORY: 1. Complete heart block, pacemaker atrial pacing currently. 2. Diastolic heart failure, chronic; ejection fraction of 55%. 3. Diabetes. 4. History of spinal stenosis. 5. History of carotid endarterectomy. 6. Benign prostatic hypertrophy with bladder outlet obstruction. 7. Hyperlipidemia.  ALLERGIES:  He has had STATIN intolerances in the past.  SOCIAL HISTORY:  Never smoked.  No alcohol consumption.  Retired Development worker, [employer].  FAMILY HISTORY:  Children, grandchildren.  Wife recently died.  REVIEW OF SYSTEMS:  Unless specified above all other 12 review of systems negative.  MEDICATIONS:  Most recent change was: 1. Lasix to 40 mg once a day. 2. Amlodipine 10 mg a day. 3. Aspirin 81 mg a day. 4. Avodart 0.5 mg a day. 5. Clonidine 0.1 mg twice a day. 6. Diovan 160 mg twice a day. 7. Humulin insulin. 8. Imdur 30 mg a day.9. Lantus 18 units at bedtime. 10.Lorazepam 0.25 mg twice a day. 11.MiraLax. 12.Multivitamin. 13.Potassium 10 mEq a day. 14.Timolol eyedrops. 15.Systane eyedrops.  16.Vicodin p.r.n. pain. 17.A rapid flow 18.Rapaflo.  PHYSICAL EXAMINATION:  VITAL SIGNS:  Previous temperature was 100.4 on arrival to hospital, at home temperature was 103 by nursing aide report, blood pressure currently 134/67, respirations 22, satting 99% on room air with a pulse of 69 atrially paced. GENERAL:  Somewhat lethargic, sleepy, but easily arousable then falls back to sleep, resting comfortably at a 30-degree angle in bed in no acute distress. EYES:  Well-perfused conjunctivae.  EOMI.  No scleral icterus. CARDIOVASCULAR:  Regular rate and rhythm with soft systolic murmur, left lower sternal border.  Difficult to palpate PMI.  LUNGS:  Clear without any  rhonchi heard, no wheezes heard, appears to have normal respiratory effort currently.  No increased work of breathing. ABDOMEN:  Soft, nontender, normoactive bowel sounds.  Mildly protuberant. CNS:  Sleepy, but follows commands as is noted in prior history and physical. EXTREMITIES:  No edema.  SCDs are in place.  Palpable distal pulses. GU:  Deferred. RECTAL:  Deferred. NEUROLOGIC:  As described above.  DATA:  Sodium currently 120 up from 119, potassium 4.5, BUN 16, creatinine 1.1, glucose 217.  BNP is 364, which is up from 145 from July 01, 2010.  White count 6.8 (even despite febrile illness), hemoglobin 10, hematocrit 29.2, platelets 234.  Venous lactic acid level 1.6.  Chest x-ray shows increase in asymmetric infiltrates or edema, left greater than right.  This image was personally reviewed.  Pacemaker in place.  Prior V/Q scan in mid December was low probability. Telemetry as noted above shows atrial pacing.  ASSESSMENT AND PLAN:  An 77 year old male admitted with febrile illness, increased lethargy, severe hyponatremia, chronic diastolic heart failure, pneumonia. 1. Chronic diastolic heart failure - Dr. Katrinka [name] had him on Lasix 80 mg     1 a day alternating with 40 mg the next.  Due to his recent illness     over the weekend that Lasix was decreased to 40 mg once a day due     to his hyponatremia with a thought that diuretics will be leading     to his hyponatremia.  Despite this decrease, his sodium was still     lower than discharge over the weekend as noted above.  His BNP is     slightly elevated when compared to prior over the weekend, and his     chest x-ray does show some mild increase in edema.  I will go ahead     and resume his Lasix at 40 mg once a day p.o.  He does not appear     to be in any clinical heart failure at this time.  However, his     situation can rapidly change.  He currently has no IV fluids, and I     do agree with this given the possibility of  fluid overload.     Continue with fluid restriction given his hyponatremia. 2. Severe hyponatremia - I have discussed this case with Dr. Donnalee [name] of [facility], and she will go ahead and consult Dr.     Casimiro [name] his nephrologist for further suggestions and     management and evaluation.  According to the daughter, Dr. Lowell [name]     at one point had placed him on 120 mg of Lasix, but "he did not     tolerate this."  Surely, his pulmonary process of pneumonia can be     contributing to his hyponatremia in the form of syndrome  of     inappropriate antidiuretic hormone; however, I would defer this to     the evaluation of Nephrology.  Continue with fluid restriction. 3. Mental status changes - his lethargy may be secondary to both     pneumonia as well as hyponatremia as he has been symptomatic with     this in the past. 4. Pneumonia - currently on Avelox for community-acquired pneumonia     coverage. 5. Hypertension - currently fairly well controlled on current     medications, which include amlodipine, clonidine, isosorbide.  I do     not currently see his previously prescribed ACE inhibitor of     lisinopril 10 mg on his medical record.  In review of his prior     discharge summary over the weekend, he actually had Diovan 160 mgtwice a day on this summary.  In Dr. Michaelle Copas last note, he had both     lisinopril 10 mg as well as Diovan 160 mg twice a day on his     medical record.  Currently, Benicar is being used as a substitution     at 20 mg a day for Diovan.  We will follow along with you.  I     appreciate you letting participate in his care.  I will relay to     Dr. Katrinka [name] the findings.     Darrell Bathe, MD     MCS/MEDQ  D:  07/05/2010  T:  07/06/2010  Job:  [id number]  Electronically Signed by Donato Schultz MD on 07/10/2010 08:02:39 AM

## 2010-07-10 NOTE — H&P (Signed)
[name], [name] NO.:  [id number]  MEDICAL RECORD NO.:  [id number]           PATIENT TYPE:  E  LOCATION:  WLED                         FACILITY:  [facility]  PHYSICIAN:  Eduard Clos, MDDATE OF BIRTH:  Sep 03, 1918  DATE OF ADMISSION:  07/05/2010 DATE OF DISCHARGE:                             HISTORY & PHYSICAL   PRIMARY CARE PHYSICIAN:  Margaretmary Bayley, M.D.  PRIMARY CARDIOLOGIST:  Lyn Records, M.D.  PRIMARY UROLOGIST:  Lindaann Slough, M.D.  CHIEF COMPLAINT:  Cough, shortness of breath, and fever.  HISTORY OF PRESENT ILLNESS:  An 77 year old male with known history of CHF; last 2-D echo was done in December 2011 which showed EF of 50-55%, history of complete heart block, status post permanent pacemaker; hypertension; recently admitted for hyponatremia; history of BPH; diabetes mellitus type 2; dyslipidemia; history of spinal stenosis, status post laminectomy presents with complains of having shortness of breath with cough and productive sputum and fever and chills.  As per the patient's health aide at home, she states that the patient had a fever of at least 103.  In the ER, the patient had a recorded temperature of 100, and chest x-ray was compatible with possible pneumonia.  The patient has been admitted for pneumonia.  The patient denies any chest pain.  Denies any dizziness, loss of conscious, any abdominal pain, dysuria, discharge, diarrhea.  No focal deficit, headache, or visual symptoms.  PAST MEDICAL HISTORY: 1. History of complete heart block, status post pacemaker placement;     history of CHF, last 2-D echo done December 2011 with EF of 50-55%. 2. History of spinal stenosis. 3. History of diabetes mellitus type 2. 4. History of carotid atherectomy. 5. History of BPH with bladder outlet obstruction. 6. Hyperlipidemia. 7. Carotid disease.  PAST SURGICAL HISTORY: 1. TURP. 2. Complete heart block with pacemaker placement. 3. Lumbar  spinal surgery.  MEDICATIONS:  The patient was just discharged with 3 days ago include: 1. Furosemide 40 mg p.o. daily. 2. Amlodipine 10 mg daily. 3. Aspirin 18 mg p.o. daily. 4. Avodart 0.5 mg p.o. daily. 5. Clonidine 0.1 mg p.o. b.i.d. 6. Diovan 160 mg p.o. b.i.d. 7. Humulin insulin. 8. Imdur 30 mg daily. 9. Lantus 18 units subcutaneous at bedtime. 10.Lorazepam 0.25 mg p.o. b.i.d. 11.MiraLax. 12.Multivitamin. 13.Potassium 10 mEq p.o. daily. 14.Systane eyedrops. 15.Timolol eye drops. 16.Vicodin. 17.Vitamin. 18.Rapaflo.  ALLERGIES:  STATIN.  SOCIAL HISTORY:  Denies smoking cigarette, drinking alcohol, or use of illegal drugs.  FAMILY HISTORY:  Noncontributory.  REVIEW OF SYSTEMS:  As per history of present illness, nothing else significant.  PHYSICAL EXAMINATION:  GENERAL:  The patient examined at bedside, not in acute distress. VITAL SIGNS:  Blood pressure 164/68, pulse 80 per minute, temperature 100.4, respiration 18 per minute, O2 sat 98%. HEENT:  Anicteric.  No pallor.  No discharge from ears, eyes normal. CHEST:  Bilateral air entry.  No rhonchi.  No crepitation. ABDOMEN:  Soft, nontender.  Bowel sounds heard. CNS:  The patient is sleeping at this time, easily arousable, he knows where he is and  follows commands.  Able to move the upper  and lower extremities. EXTREMITIES:  Peripheral pulses felt.  No edema.  LABORATORY DATA:  Chest x-ray shows some increase in asymmetric infiltrates or edema, left greater than right.  CBC; WBC 8.9, hemoglobin is 11, hematocrit is 31.2, and platelets 241,000.  Basic metabolic panel; sodium 119, potassium 4.9, chloride 86, carbon dioxide 25, glucose 259, BUN 18, creatinine 1.2, calcium 9.2, and lactic acid 1.6.  ASSESSMENT: 1. Pneumonia. 2. Severe hyponatremia. 3. History of diabetes mellitus type 2. 4. History of hyperlipidemia. 5. History of complete heart block, status post pacemaker placement. 6. Anemia. 7. History of  hypertension. 8. History of carotid endarterectomy. 9. Chronic low-back pain.  PLAN: 1. At this time, we will admit the patient to telemetry. 2. For his pneumonia, we will treat it as healthcare associated.  The     patient has been started on Avelox which we will continue. 3. Fever, possibly from pneumonia or multiple, going to check UA and     urine cultures. 4. Severe hyponatremia.  The patient was just recently admitted for     the same.  Sodium was improved 129 at that time.  I am going to     check urine sodium stat along with urine osmolality and plasma     osmolality.  We will also check TSH and cortisol level.  At this     time, I am holding his Lasix until we get urine studies.  Based on     the urine studies, we will further plan if he needs to be on IV     fluids or restart Lasix.  We will check BMET q.4h. at least for     next 4-5 times. 5. Hypertension, diabetes, and hyperlipidemia.  We will continue     present medications. 6. The patient is a full code.     Eduard Clos, MD     ANK/MEDQ  D:  07/05/2010  T:  07/05/2010  Job:  [id number]  cc:   Lindaann Slough, M.D. Fax: [phone]  Lyn Records, M.D. Fax: [phone]  Margaretmary Bayley, M.D. Fax: [phone]  Electronically Signed by Midge Minium MD on 07/10/2010 05:07:40 PM

## 2010-07-11 LAB — CULTURE, BLOOD (ROUTINE X 2): Culture: NO GROWTH

## 2010-07-18 ENCOUNTER — Other Ambulatory Visit: Payer: Self-pay | Admitting: Internal Medicine

## 2010-07-18 ENCOUNTER — Ambulatory Visit
Admission: RE | Admit: 2010-07-18 | Discharge: 2010-07-18 | Disposition: A | Discharge status: Home / Self Care | Payer: Medicare Other | Source: Ambulatory Visit | Attending: Internal Medicine | Admitting: Internal Medicine

## 2010-07-18 DIAGNOSIS — J189 Pneumonia, unspecified organism: Secondary | ICD-10-CM

## 2010-07-20 NOTE — Discharge Summary (Signed)
  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  The patient is to follow up for a CT scan of chest, abdomen and pelvis as well as CT scan of the head.  CT outpatient (the patient had a CT scan of his head done.  DICTATION ENDED AT THIS POINT.     Kela Millin, M.D.     ACV/MEDQ  D:  07/08/2010  T:  07/08/2010  Job:  [id number]  Electronically Signed by Donnalee Curry M.D. on 07/18/2010 07:36:58 PM

## 2010-07-20 NOTE — Discharge Summary (Signed)
[name], [name]                ACCOUNT NO.:  [id number]  MEDICAL RECORD NO.:  [id number]           PATIENT TYPE:  I  LOCATION:  1430                         FACILITY:  [facility]  PHYSICIAN:  Kela Millin, M.D.DATE OF BIRTH:  02-01-1919  DATE OF ADMISSION:  07/05/2010 DATE OF DISCHARGE:  07/08/2010                        DISCHARGE SUMMARY - REFERRING   DISCHARGE DIAGNOSES: 1. 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 2. Iron-deficiency anemia - The patient to continue iron     supplementation, follow up with  GI outpatient for further     evaluation. 3. Pneumonia, left greater than right. 4. Chronic diastolic heart failure. 5. Hypertension. 6. Diabetes mellitus. 7. History of complete heart block and status post pacemaker placement     in the past. 8. History of spinal stenosis. 9. History of carotid endarterectomy. 10.History of benign prostatic hypertrophy with bladder outlet     obstruction of the past. 11.History of hyperlipidemia.  PROCEDURES AND STUDIES:  Chest x-ray on February 22 - some increase in asymmetric infiltrates or edema, left greater than right.  CONSULTATIONS: 1. Cardiology - Jake Bathe, MD/Henry Malissa Hippo, M.D. 2. Nephrology - Duke Salvia. Eliott Nine, M.D.  BRIEF HISTORY:  The patient is an 77 year old black male with the above- listed medical problems who presented with complaints of cough, shortness of breath and fever.  It was reported that he had fevers up to 103 and in the ED his temperature was 100, chest x-ray was consistent with pneumonia, he was noted to be hyponatremic with sodium of 119 as well.  He was admitted for further evaluation and management.  HOSPITAL  COURSE: 1. Bilateral pneumonia - Upon admission blood cultures were obtained,     and the patient was placed on empiric antibiotics with Avelox.  His     symptoms improved, he defervesced and his blood cultures have     remained with no growth.  PT/OT was consulted and evaluated the     patient while in the hospital and indicated that he was at his     baseline.  He will be discharged on oral antibiotics at this time     and is to follow up outpatient. 2. Acute on chronic hyponatremia - As discussed above, the patient's     sodium on admission was 119, and it was noted that he does have a     history of hyponatremia.  Nephrology was consulted to see the     patient as he had been followed by Dr. Renae Fickle outpatient.  Dr. Eliott Nine     saw him and noted that he had hyponatremia present to some degree     of and on since 2007 and had at least a set of urine studies in the     month prior to this  admission that was consistent with SIADH - His     urine osmolality was inappropriately high for plasma on February 3.     Workup included TSH and a serum cortisol level which came back     negative.  His chest x-rays have demonstrated no evidence of lung     cancer.  Dr. Elza Rafter impression was that the patient's acute     hyponatremia was most likely precipitated by the pulmonary     process/pneumonia above, but the concern was why the patient seemed     to have more of a propensity in the past several months for the     development of fairly substantial hyponatremia and the underlying     cause had not yet been identified.  The patient had been started on     Lasix 40 mg following his admission, and she increased it to 80 mg     daily with fluid restriction of 1500 cc per day.  With this, his     sodium has gradually but steadily been increasing, up to 127 today.     Dr. Eliott Nine also recommended workup for tumors - CT scan of chest     particularly as well as abdomen, pelvis and brain and recommended      that they be done outpatient.  I discussed this with the patient     and his family and they will follow up with this outpatient MDs.     His sodium today prior to discharge is 127, he is to follow up for     a sodium/BMET to be done on Monday, February 27, and further     management per Renal outpatient. 3. Anemia - The patient reported that he does have a history of anemia     and had been on iron supplements outpatient.  An anemia panel was     done and his iron level was 17.  His hemoglobins have remained     stable, and he has had no evidence of gross bleeding during this     hospital stay.  The patient states that his last colonoscopy was     done by Dr. Arlyce Dice, and he thinks that was about 5 years ago.  I     have recommended that he follow up with Dr. Elwin Sleight GI     outpatient for further evaluation and management as appropriate. 4. Hypertension - He was maintained on his outpatient medications     during this hospital stay and is to continue them upon discharge. 5. Diabetes mellitus - He was maintained on his insulin during this     hospital stay and is to continue it upon discharge. 6. Chronic diastolic heart failure - Cardiology was consulted and saw     the patient during this hospital stay.  He was maintained on Lasix     and, as discussed above, his dose was changed to 80 mg daily, and     he is to continue it upon discharge.  He is asymptomatic at this     time with no shortness of breath or peripheral edema today. 7. His other chronic medical conditions remained stable during this     hospital stay , and he is to continue his outpatient medications     except as indicated above.  DISCHARGE MEDICATIONS: 1. Iron sulfate 325 mg p.o. b.i.d. 2. Avelox 400 mg p.o. daily for 6 more days. 3. Lasix 80 mg p.o. daily. 4. KCl 20  mEq p.o. daily. 5. Norvasc 10 mg p.o. q.a.m. 6. Aspirin 81 mg as previously. 7. Avodart 0.5 mg p.o. daily. 8. Calcium citrate 500 mg 1 p.o.  daily. 9. Clonidine 0.1 mg p.o. b.i.d. 10.Diovan 160 mg p.o. b.i.d. 11.Fish oil 1 g p.o. daily. 12.Humalog 4-6 units subcu t.i.d. 13.Isosorbide mononitrate XR 30 mg p.o. daily. 14.Lantus 18 units subcu q.h.s. 15.Lorazepam 0.5 mg half tablet p.o. b.i.d. 16.MiraLax 17 g p.o. daily. 17.Rapaflo 8 mg 1 p.o. daily. 18.Systane eye drops 1 to both eyes q.3 h. p.r.n. 19.Timolol ophthalmic 0.5% b.i.d. 20.Vicodin ES 1 tablet q.6 h. p.r.n. 21.Vitamin D 3000 units 1 capsule daily.  FOLLOWUP CARE: 1. The patient is to have a BMET done on July 10, 2010, and Dr.     Eliott Nine has set the patient up for this outpatient. 2. Dr. Margaretmary Bayley in 1 week, the patient to call for appointment. 3. Dr. Lowell Guitar on July 24, 2010 at 9:15. 4. Dr. Arlyce Dice, the patient to call for appointment upon discharge.  DISCHARGE CONDITION:  Improved/stable.     Kela Millin, M.D.     ACV/MEDQ  D:  07/08/2010  T:  07/08/2010  Job:  [id number]  cc:   Margaretmary Bayley, M.D. Fax: [phone]  Mindi Slicker. Lowell Guitar, M.D. Fax: [phone]  Barbette Hair. Arlyce Dice, MD,FACG [address] [place] [address]  Lyn Records, M.D. Fax: [phone]  Electronically Signed by Donnalee Curry M.D. on 07/18/2010 07:36:58 PM

## 2010-07-24 LAB — CBC
HCT: 31.4 % — ABNORMAL LOW (ref 39.0–52.0)
HCT: 33.3 % — ABNORMAL LOW (ref 39.0–52.0)
Hemoglobin: 11.1 g/dL — ABNORMAL LOW (ref 13.0–17.0)
MCH: 31.4 pg (ref 26.0–34.0)
MCHC: 33.3 g/dL (ref 30.0–36.0)
MCV: 93.5 fL (ref 78.0–100.0)
MCV: 94.1 fL (ref 78.0–100.0)
Platelets: ADEQUATE 10*3/uL (ref 150–400)
RDW: 12.6 % (ref 11.5–15.5)
RDW: 12.6 % (ref 11.5–15.5)
RDW: 12.7 % (ref 11.5–15.5)
WBC: 7.3 10*3/uL (ref 4.0–10.5)
WBC: 9.8 10*3/uL (ref 4.0–10.5)

## 2010-07-24 LAB — COMPREHENSIVE METABOLIC PANEL
BUN: 18 mg/dL (ref 6–23)
CO2: 32 mEq/L (ref 19–32)
Calcium: 9.2 mg/dL (ref 8.4–10.5)
Creatinine, Ser: 1.18 mg/dL (ref 0.4–1.5)
GFR calc non Af Amer: 58 mL/min — ABNORMAL LOW (ref >60)
Glucose, Bld: 200 mg/dL — ABNORMAL HIGH (ref 70–99)
Total Bilirubin: 0.7 mg/dL (ref 0.3–1.2)

## 2010-07-24 LAB — D-DIMER, QUANTITATIVE: D-Dimer, Quant: 0.59 ug/mL-FEU — ABNORMAL HIGH (ref 0.00–0.48)

## 2010-07-24 LAB — GLUCOSE, CAPILLARY
Glucose-Capillary: 170 mg/dL — ABNORMAL HIGH (ref 70–99)
Glucose-Capillary: 175 mg/dL — ABNORMAL HIGH (ref 70–99)
Glucose-Capillary: 217 mg/dL — ABNORMAL HIGH (ref 70–99)
Glucose-Capillary: 217 mg/dL — ABNORMAL HIGH (ref 70–99)
Glucose-Capillary: 254 mg/dL — ABNORMAL HIGH (ref 70–99)
Glucose-Capillary: 256 mg/dL — ABNORMAL HIGH (ref 70–99)
Glucose-Capillary: 273 mg/dL — ABNORMAL HIGH (ref 70–99)

## 2010-07-24 LAB — BASIC METABOLIC PANEL
BUN: 15 mg/dL (ref 6–23)
BUN: 16 mg/dL (ref 6–23)
BUN: 19 mg/dL (ref 6–23)
CO2: 32 mEq/L (ref 19–32)
Calcium: 9.3 mg/dL (ref 8.4–10.5)
Creatinine, Ser: 1.21 mg/dL (ref 0.4–1.5)
Creatinine, Ser: 1.27 mg/dL (ref 0.4–1.5)
GFR calc Af Amer: >60 mL/min (ref >60)
GFR calc non Af Amer: 47 mL/min — ABNORMAL LOW (ref >60)
GFR calc non Af Amer: 53 mL/min — ABNORMAL LOW (ref >60)
GFR calc non Af Amer: 55 mL/min — ABNORMAL LOW (ref >60)
GFR calc non Af Amer: 56 mL/min — ABNORMAL LOW (ref >60)
Glucose, Bld: 290 mg/dL — ABNORMAL HIGH (ref 70–99)
Glucose, Bld: 231 mg/dL — ABNORMAL HIGH (ref 70–99)
Potassium: 3.7 mEq/L (ref 3.5–5.1)
Potassium: 3.7 mEq/L (ref 3.5–5.1)
Potassium: 3.9 mEq/L (ref 3.5–5.1)
Sodium: 134 mEq/L — ABNORMAL LOW (ref 135–145)
Sodium: 132 mEq/L — ABNORMAL LOW (ref 135–145)

## 2010-07-24 LAB — DIFFERENTIAL
Basophils Absolute: 0 10*3/uL (ref 0.0–0.1)
Lymphocytes Relative: 13 % (ref 12–46)
Monocytes Absolute: 1.4 10*3/uL — ABNORMAL HIGH (ref 0.1–1.0)
Neutro Abs: 7.2 10*3/uL (ref 1.7–7.7)
Neutrophils Relative %: 73 % (ref 43–77)

## 2010-07-24 LAB — CARDIAC PANEL(CRET KIN+CKTOT+MB+TROPI)
CK, MB: 2.9 ng/mL (ref 0.3–4.0)
Relative Index: 0.6 (ref 0.0–2.5)
Total CK: 254 U/L — ABNORMAL HIGH (ref 7–232)
Total CK: 309 U/L — ABNORMAL HIGH (ref 7–232)
Troponin I: 0.16 ng/mL — ABNORMAL HIGH (ref 0.00–0.06)
Troponin I: 0.16 ng/mL — ABNORMAL HIGH (ref 0.00–0.06)
Troponin I: 0.2 ng/mL — ABNORMAL HIGH (ref 0.00–0.06)

## 2010-07-24 LAB — BRAIN NATRIURETIC PEPTIDE
Pro B Natriuretic peptide (BNP): 445 pg/mL — ABNORMAL HIGH (ref 0.0–100.0)
Pro B Natriuretic peptide (BNP): 446 pg/mL — ABNORMAL HIGH (ref 0.0–100.0)

## 2010-07-24 LAB — HEMOGLOBIN A1C: Mean Plasma Glucose: 212 mg/dL — ABNORMAL HIGH (ref <117)

## 2010-07-24 LAB — PROTIME-INR: Prothrombin Time: 14.5 seconds (ref 11.6–15.2)

## 2010-07-24 LAB — HEPARIN LEVEL (UNFRACTIONATED): Heparin Unfractionated: 0.33 IU/mL (ref 0.30–0.70)

## 2010-07-24 LAB — TROPONIN I: Troponin I: 0.15 ng/mL — ABNORMAL HIGH (ref 0.00–0.06)

## 2010-07-25 LAB — URINALYSIS, ROUTINE W REFLEX MICROSCOPIC
Glucose, UA: NEGATIVE mg/dL
Ketones, ur: NEGATIVE mg/dL
pH: 6.5 (ref 5.0–8.0)

## 2010-07-25 LAB — DIFFERENTIAL
Eosinophils Absolute: 0.3 10*3/uL (ref 0.0–0.7)
Lymphocytes Relative: 26 % (ref 12–46)
Lymphs Abs: 2 10*3/uL (ref 0.7–4.0)
Neutro Abs: 4.7 10*3/uL (ref 1.7–7.7)
Neutrophils Relative %: 61 % (ref 43–77)

## 2010-07-25 LAB — POCT I-STAT, CHEM 8
BUN: 23 mg/dL (ref 6–23)
Chloride: 93 mEq/L — ABNORMAL LOW (ref 96–112)
Creatinine, Ser: 1.2 mg/dL (ref 0.4–1.5)
Creatinine, Ser: 1.4 mg/dL (ref 0.4–1.5)
HCT: 38 % — ABNORMAL LOW (ref 39.0–52.0)
Hemoglobin: 12.9 g/dL — ABNORMAL LOW (ref 13.0–17.0)
Hemoglobin: 12.9 g/dL — ABNORMAL LOW (ref 13.0–17.0)
Potassium: 4.1 mEq/L (ref 3.5–5.1)
Potassium: 4.4 mEq/L (ref 3.5–5.1)
Sodium: 129 mEq/L — ABNORMAL LOW (ref 135–145)
Sodium: 129 mEq/L — ABNORMAL LOW (ref 135–145)
TCO2: 33 mmol/L (ref 0–100)

## 2010-07-25 LAB — CBC
Hemoglobin: 11.8 g/dL — ABNORMAL LOW (ref 13.0–17.0)
Platelets: 271 10*3/uL (ref 150–400)
RBC: 3.8 MIL/uL — ABNORMAL LOW (ref 4.22–5.81)
WBC: 7.7 10*3/uL (ref 4.0–10.5)

## 2010-07-25 LAB — POCT CARDIAC MARKERS
CKMB, poc: 3 ng/mL (ref 1.0–8.0)
Myoglobin, poc: 179 ng/mL (ref 12–200)
Troponin i, poc: <0.05 ng/mL (ref 0.00–0.09)

## 2010-07-25 LAB — CK: Total CK: 236 U/L — ABNORMAL HIGH (ref 7–232)

## 2010-07-26 NOTE — Consult Note (Signed)
[name], [name]                ACCOUNT NO.:  [id number]  MEDICAL RECORD NO.:  [id number]           PATIENT TYPE:  I  LOCATION:  1430                         FACILITY[facility]  [facility]  PHYSICIAN:  Annisten Manchester B. Eliott [name], Darrell AllenDATE OF BIRTH:  11/21/1918  DATE OF CONSULTATION: DATE OF DISCHARGE:                                CONSULTATION   HISTORY OF PRESENT ILLNESS:  We are asked to see [name] because of hyponatremia.  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  We are asked to see him because of hyponatremia.  On review of records from E chart as far back as 2007, he has had borderline or low sodium levels (specifically July 2007 sodium 129; September 2007, sodium 135; November 2011, sodium 129).  He was hospitalized from April 25, 2010 through April 27, 2010 with acute diastolic heart failure that required diuresis.  His serum sodium ranged between 130 to 134 during that admission.  He had a post hospital visit with [name] and was found to have a serum sodium of 124 to 125.  He was referred to [name] for evaluation.  Of note, on June 16, 2010, at the time when his serum sodium was 128 with an estimated plasma osmolality of 272, his urine osmolality was 359 inappropriately high for the serum and his TSH and cortisol were normal on that same date.  [name] saw him on June 20, 2010.  At that time, his assessment was that [name] had hypervolemic hyponatremia and advised increasing his Lasix to 120 mg per day - 80 in the morning and 40 in the afternoon and felt that it might need to be titrated upwards.  Sodium was 127 on that particular occasion  The patient followed up with [name] couple of days later because he could not tolerate these higher dose  of Lasix and the dose was reduced, although I am not sure what the specific reduction was at that time.  He was hospitalized again on July 01, 2010  with nausea and leg cramps which were attributed to a serum sodium of 121 at the time of admission.  Although he was said to be "over diuresed", he was only taking 80 mg of Lasix one day and 40 the next, so this represented a reduced dose relative to his discharge dose from December when he had heart failure,  and his chest x-ray showed vascular congestion and his BNP was slightly elevated.  He did, however, receive isotonic sodium chloride.  His urine osmolality was 297 with a serum of 259, so inappropriately high relative to the serum.  He was discharged on Lasix 40 mg once a day.  On the evening of discharge, he developed a cough and over the next couple of days, the cough worsened.  He had fevers as high as 103 and because he felt he might have pneumonia, he was brought back to the hospital where a chest x-ray showed  asymmetric perihilar and basilar interstitial process read out as edema versus infiltrate which was increased since his x-ray of July 01, 2010 and his sodium was down to 119 with a recheck of 120.  His cortisol and TSH were again normal. Urine sodium was 29.  Urine was not dilute at least based on specific gravity of 1.018 and urine osmolality is still pending.  He reports that in addition to developing these URI-type symptoms, he had a period of disorientation prior to being brought to the emergency room.  He was trying to limit his fluids to 1500 cc per day and the caregiver who has been looking after him for over 30 years was giving him a diet that was almost devoid of sodium by her history.  The only other thing he has noticed in his very active life (which still includes working and attending meetings and reading a literature on the weekend) is that he has had some gait instability over the past few  months.  PAST MEDICAL HISTORY: 1. Diabetes. 2. Hypertension. 3. Dyslipidemia. 4. History of spinal stenosis. 5. History of TURP. 6. Right carotid endarterectomy. 7. History of pacemaker. 8. History of diastolic congestive heart failure in December of 2011.  CURRENT MEDICATIONS:  His current medicines are: 1. Furosemide 40 mg a day. 2. Amlodipine 10 mg a day. 3. Aspirin 81 mg a day. 4. Avodart 0.5 mg a day. 5. Clonidine 0.1 mg b.i.d. ([name] had instructed him to decrease     this to once a day at bedtime because it made him sleepy). 6. Diovan 160 mg b.i.d. 7. Lantus insulin 30 units a day. 8. Ativan 0.25 mg b.i.d. 9. Potassium 10 mEq a day. 10.MiraLax. 11.Systane eyedrops. 12.Vicodin. 13.Rapaflo.  ALLERGIES:  He is intolerant of STATINS.  FAMILY HISTORY:  Positive for hypertension, cardiac disease and father died of amyloidosis.  SOCIAL HISTORY:  He is a widower, has 6 children who live outside the [place] area.  Has a remote history of cigarettes, occasionally smokes a pipe and does not drink.  He has a caregiver who originally worked with him in his office and now takes care of him at least 5 days a week and more recently 7 days a week 24 hours a day for the past 30 years.  REVIEW OF SYSTEMS:  Positive for some gait instability for 2 to 3 months, cough for 2 to 3 days, green sputum, decreased energy.  He has had no abdominal pain or chest pain.  Denies shortness of breath but says he has not really been doing anything for the past 2 or 3 days.  He has had no nausea, vomiting or diarrhea.  He had a previous period of disorientation prior to admission to the hospital and has had some swelling of his legs.  PHYSICAL EXAMINATION:  GENERAL:  On physical exam, he is pleasant, articulate, oriented and certainly looks and behaves younger than his stated age of 77.  O2 sat is 92% on room air. VITAL SIGNS:  Temperature 98.6 with a reported T-max of 103 prior  to admission.  He is wearing a hearing aid. NECK:  He has approximately 6 cm JVP.  There are crackles on the left third the way up and at the right base. CARDIAC:  Rhythm regular.  S1, S2.  No S3.  Pacer is in the left chest. ABDOMEN:  Soft, nontender. EXTREMITIES:  Showed 1+ left lower extremity and trace right lower extremity edema.  LABORATORY DATA:  Most recent sodium  120, potassium 4.5, chloride 88, CO2 of 26, BUN 16, creatinine 1.1, glucose 217, calcium 8.9, WBC 6800, hemoglobin 10 with a transferrin saturation of 9%, platelets 234,000. Uric acid 4.1.  Urinalysis, specific gravity 1.018, pH of 6.5, 30 mg% protein, 0 to 2 white cells.  Cortisol 6.9.  TSH 0.572.  Urine sodium 29.  Urine and plasma osmolalities are pending.  Chest x-ray shows some cardiomegaly, pacer in the left anterior chest with basilar and perihilar interstitial infiltrates, left greater than right, read as edema versus infiltrate.  IMPRESSION:  An 77 year old with past history of diabetes, hypertension and some degree of chronic hyponatremia who presents with an acute element of hyponatremia. 1. Chronic hyponatremia - this has been present to some degree off and     on since 2007 with at least one set of urine studies within the     past month that suggest inappropriate secretion of ADH with urine     osmolality that was inappropriately high for plasma on February 3,     at the time when he was not felt to be volume depleted.  The     etiology for the chronic hyponatremia is not clear.  He has had     normal TSH and cortisol.  His chest x-rays have demonstrated no     evidence for lung tumor.  He has not had a CT of the brain or other     workup for malignancy that might be a possible cause, although this     has been very chronic 2. Acute on chronic component to the hyponatremia - this is most     likely secondary to a pulmonary process with his upper respiratory     infection (probable/possible pneumonia)  which can certainly     stimulate ADH release and worsen hyponatremia in this patient.  The     curious part to me here is why he seems to have more of a     propensity within the past several months for the development of a     fairly substantial hyponatremia and is there an underlying cause     that has not yet been identified. 3. Possible pneumonia, left greater than right. 4. History of diastolic congestive heart failure with slight elevation     in BNP at this time. 5. Diabetes. 6. Hypertension. 7. History of TUR.  RECOMMENDATIONS:  At this point I would increase his Lasix to 80 mg per day, limit his fluids to 1500 cc per day, treat his pneumonia as you are doing, and we will follow up on the results of his urine and serum osmolalities.  It may be that he deserves a more extensive workup for underlying causes of inappropriate ADH other than recent osmolality state, age or idiopathic and these would include tumors (particularly in the chest), brain (would require a CT of the brain).  As best I can tell, offending drugs have been discontinued (he was on lisinopril in the past which can cause inappropriate ADH but this was discontinued back in early February).  In addition, he has an anemia with iron deficiency that I believe merits workup since iron deficiency would be extremely uncommon in a male with a normal diet and his anemia is disproportionate to his renal disease and probably more related to iron deficiency.  Minimally, he should have stools checked with further workup as appropriate.  Thanks for asking [place] to see him and we will follow closely with you.  [name], M.D.     CBD/MEDQ  D:  07/06/2010  T:  07/06/2010  Job:  [id number]  Electronically Signed by Camille Bal M.D. on 07/26/2010 01:55:16 PM

## 2010-07-29 LAB — GLUCOSE, CAPILLARY
Glucose-Capillary: 172 mg/dL — ABNORMAL HIGH (ref 70–99)
Glucose-Capillary: 175 mg/dL — ABNORMAL HIGH (ref 70–99)
Glucose-Capillary: 214 mg/dL — ABNORMAL HIGH (ref 70–99)

## 2010-09-26 NOTE — Procedures (Signed)
CAROTID DUPLEX EXAM   INDICATION:  Follow up carotid artery disease.   HISTORY:  Diabetes:  Yes.  Cardiac:  Pacemaker, yes.  Hypertension:  Yes.  Smoking:  No.  Previous Surgery:  Left CEA in 09/2005.  CV History:  Asymptomatic.  Amaurosis Fugax No, Paresthesias No, Hemiparesis No.                                       RIGHT             LEFT  Brachial systolic pressure:         165               170  Brachial Doppler waveforms:         WNL               WNL  Vertebral direction of flow:        No flow visualized                  Antegrade  DUPLEX VELOCITIES (cm/sec)  CCA peak systolic                   68                85  ECA peak systolic                   46                55  ICA peak systolic                   85                75  ICA end diastolic                   26                27  PLAQUE MORPHOLOGY:  PLAQUE AMOUNT:                      None              None  PLAQUE LOCATION:   IMPRESSION:  1. Normal carotid duplex bilaterally, status post left carotid      endarterectomy.  2. Left vertebral artery flow appears antegrade, right vertebral      artery flow not detected.  3. No significant changes from previous study.   ___________________________________________  Larina Earthly, M.D.   AS/MEDQ  D:  06/10/2009  T:  06/10/2009  Job:  [id number]

## 2010-09-26 NOTE — Procedures (Signed)
CAROTID DUPLEX EXAM   INDICATION:  Followup left carotid endarterectomy.   HISTORY:  Diabetes:  Yes.  Cardiac:  Pacemaker.  Hypertension:  Yes.  Smoking:  No.  Previous Surgery:  Left carotid endarterectomy in 09/2005.  CV History:  No.  Amaurosis Fugax No, Paresthesias No, Hemiparesis No                                       RIGHT             LEFT  Brachial systolic pressure:         140               140  Brachial Doppler waveforms:         Biphasic          Biphasic  Vertebral direction of flow:        Not well visualized                 Antegrade  DUPLEX VELOCITIES (cm/sec)  CCA peak systolic                   59                96  ECA peak systolic                   87                57  ICA peak systolic                   37                45  ICA end diastolic                   10                19  PLAQUE MORPHOLOGY:                  None              None  PLAQUE AMOUNT:                      None              None  PLAQUE LOCATION:                    None              None   IMPRESSION:  1. Normal carotid duplex noted bilaterally.  2. Status post left carotid endarterectomy.  3. Antegrade left vertebral arteries.   ___________________________________________  P. Liliane Bade, M.D.   MG/MEDQ  D:  05/20/2008  T:  05/20/2008  Job:  [id number]

## 2010-09-26 NOTE — Procedures (Signed)
CAROTID DUPLEX EXAM   INDICATION:  Follow up carotid artery disease.   HISTORY:  Diabetes:  Yes  Cardiac:  Pacemaker  Hypertension:  Yes  Smoking:  No  Previous Surgery:  Left carotid endarterectomy with DPA in 09/2005 by  Dr. [place] S[place] Bing.  CV History:  Amaurosis Fugax No, Paresthesias No, Hemiparesis No                                       RIGHT             LEFT  Brachial systolic pressure:         160               160  Brachial Doppler waveforms:         Triphasic         Triphasic  Vertebral direction of flow:        Not detected      Antegrade  DUPLEX VELOCITIES (cm/sec)  CCA peak systolic                   56                82  ECA peak systolic                   44                87  ICA peak systolic                   24                26  ICA end diastolic                   8                 10  PLAQUE MORPHOLOGY:                  None              None  PLAQUE AMOUNT:                      None              None  PLAQUE LOCATION:                    None              None   IMPRESSION:  No ICA stenosis bilaterally.  Status post left carotid  endarterectomy.  Right vertebral artery not detected.  Occlusion versus technical  difficulty.   ___________________________________________  P. Liliane Bade, M.D.   DP/MEDQ  D:  04/17/2007  T:  04/17/2007  Job:  [id number]

## 2010-09-29 NOTE — Op Note (Signed)
NAME:  [name], FAKHOURI                          ACCOUNT NO.:  [id number]   MEDICAL RECORD NO.:  [id number]                   PATIENT TYPE:  OIB   LOCATION:  2855                                 FACILITY:  [facility]   PHYSICIAN:  Richard A. [name], M.D.          DATE OF BIRTH:  November 30, 1918   DATE OF PROCEDURE:  10/29/2002  DATE OF DISCHARGE:                                 OPERATIVE REPORT   PROCEDURE:  Explantation of end of life DDD non rate responsive Paragon-2  pacesetter #[id number]; SN:  [id number] pulse generator. Electro testing and  retrograde conduction studies, implantation of new A-V universal pacesetter  Identity ADX XLDR model [id number]; SN:  [id number] pulse generator.   IMPLANTING PHYSICIAN:  Richard A. [name], M.D.   COMPLICATIONS:  None.   ESTIMATED BLOOD LOSS:  Approximately 20 cc.   ANESTHESIA:  5 mg Valium p.o. premedication, 1% local Xylocaine, Nubain 4 mg  IV, Versed 2 mg IV in divided doses.   PREOPERATIVE DIAGNOSIS:  1. Symptomatic bradycardia with presyncope. Documented sinus arrest with     bradycardia status post PQVP 01/29/92. DDD non rate responsive pulse     generator.  2. End of life generator with recurrent symptomatic bradycardia, presyncope,     and weakness.  3. Systemic hypertension on medical therapy.  4. Insulin-dependent diabetes mellitus on split dose insulin.  5. Venous insufficiency.  6. Peripheral vascular disease, noninvasive testing on chronic Pletal     therapy with improvement.  7. Exogenous obesity.  8. Negative Cardiolite for ischemia 12/01 with no angina.  9. Normal systolic function on remote 2-D echo.   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 He is a  nonsmoker and has diagnoses as outlined above. He was found to have end of  life pacemaker on office follow-up and was referred by Dr. Elsie Lincoln and his  assistant for generator change and  troubleshooting because of lack of output  to assure chronic lead integrity. In addition to the above history the  patient has a history of a chronic thyroid nodule that is followed by Dr.  Margaretmary Bayley. He has had normal creatinine. BUN and creatinine was 22/1.3,  H&H 11.7/34.8 preoperative. Potassium 3.7 on supplement.   DESCRIPTION OF PROCEDURE:  Informed consent was obtained from the patient  and his family to proceed with pacemaker surgery as outlined above.  Coagulation studies were normal.   The patient was premedicated with 5 mg of valium p.o. He was given 1 g of  Ancef on call to the laboratory and he was brought to the second floor CP  lab in a post absorptive state. Then 1% Xylocaine was used for local  anesthesia. He was given intermittent Nubain, valium, and Versed for  sedation.   The fluoroscopy showed good position of the previously placed atrial and  ventricular electrodes and generator in  the left chest. A left transverse  incision was performed below the previous incision. This was brought down to  the fibrous capsule using blunt dissection and electrocautery to control  hemostasis. The fibrous capsule was incised and opened carefully and the  generator was freed up using blunt dissection and delivered from the pocket.  Note the fibrous capsule was relatively small and quite adherent. It was  debrided and removed from the pocket with good subcutaneous tissue present.  The electrodes were removed from the generator and identified. The atrial  electrode was an Oscor #[id number]; [id number].   The ventricular passive fixation.   The ventricular electrode was a Pacesetter [id number]; SN: [id number]. This was from  the records and we are able to get the numbers off of the ventricular  electrode, however, the atrial electrode serial numbers were no long  visible. The generator was a 2016L Pacesetter Paragon 2 SN: [id number].   The atrial and ventricular electrodes were cleaned and  unipolar and bipolar  thresholds with excellent results. Retrograde V-A conduction study was done,  with ventricular pacing and atrial recording through the implanted  electrodes showing retrograde V-A conduction of 230-240 milliseconds  increasing up to 250-260 at a rate of 125 and retrograde Wenckebach at a  rate of 130 per minute.   The pocket was irrigated with 500 mg of kanamycin solution. Hemostasis was  secured using electrocautery. The new generator, which was a large batter A-  V universal rate responsive Pacesetter multi programmable generator was  inserted and it was loosely secured to the posterior fibrous capsule with a  1-0 silk suture to prevent immigration. The atrial and ventricular  electrodes were identified. VS1 connectors were inserted into the proper  atrial and ventricular headers and a single hex nut tightened for each  electrode. The generator fit quite well in the pocket. The subcutaneous  tissue was closed with two separate running layers of 2-0 Dexon suture and  the skin was closed with 5-0 subcuticular Dexon suture and Steri-Strips were  applied. Fluoroscopy showed good position of the atrial and ventricular  electrodes. Adequate AV pacing was visualized with magnet testing in the  lab. The patient was not pacer dependent. Had underlying sinus bradycardia  at approximately 40-45 per minute with APCs. He was transferred to the  holding area for postoperative programming in stable condition. Magnet rate  at BOL is 100 and at EOL magnet rate will drop to 86.3.   Thresholds:  Bipolar atrial:  P=7.8 mv, resistance 480 Ohms, minimal  threshold 1.0 v at 0.5 milliseconds.   Unipolar atrial:  P=4.1 mv, resistance 395 Ohms, minimal threshold 1.8 v at  0.5 milliseconds.   Ventricular bipolar:  R=7.7 mv, resistance 632 Ohms, minimal threshold 1.2 v  at 0.5 milliseconds.  Unipolar ventricular:  R=7.8 mv, resistance 440 Ohms, minimal threshold 0.8  v at 0.5  milliseconds.                                               Richard A. [name], M.D.    RAW/MEDQ  D:  10/29/2002  T:  10/29/2002  Job:  [id number]   cc:   Margaretmary Bayley, M.D.  [address], [address]  [place]  [place] [address]  Fax: [phone]   Madaline Savage, M.D.  [phone] [address], [address]  [place]  [place] [address]  Fax: [phone]   Theresia Majors. Tanda Rockers, M.D.  [address]  [place]  [place] [address]  Fax: [phone]   Eino Farber., M.D.  [address] [place]  [place] [address]  Fax: [phone]

## 2010-09-29 NOTE — Discharge Summary (Signed)
NAMEJOYCE, LECKEY                ACCOUNT NO.:  [id number]   MEDICAL RECORD NO.:  [id number]          PATIENT TYPE:  INP   LOCATION:  2023                         FACILITY:  [facility]   PHYSICIAN:  Balinda Quails, M.D.    DATE OF BIRTH:  June 22, 1918   DATE OF ADMISSION:  09/14/2005  DATE OF DISCHARGE:  09/17/2005                                 DISCHARGE SUMMARY   PRIMARY DIAGNOSIS:  Left internal carotid artery stenosis, asymptomatic.   SECONDARY DIAGNOSES:  1.  Hypertension.  2.  Hyperlipidemia.  3.  Diabetes mellitus.  4.  Peripheral vascular disease.  5.  History of sick sinus syndrome status post permanent pacemaker      placement.  6.  Status post left knee replacement.  7.  Status post transurethral resection of the prostate.  8.  Status post cataract extractions.  9.  Status post laminectomy.   ALLERGIES:  The patient has intolerance to CRESTOR and VYTORIN secondary to  myalgias.   IN-HOSPITAL OPERATIONS AND PROCEDURES:  Left carotid endarterectomy with  Dacron patch angioplasty.   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  The patient recently underwent  carotid duplex ultrasound showing left ICA stenosis.  Dr. Jacinto Halim then  performed a CT angiogram of the neck which showed high-grade stenosis of the  proximal left ICA along with occluded left vertebral artery.  The patient  was asymptomatic from this.  The patient was seen and evaluated by Dr.  Madilyn Fireman.  Dr. Madilyn Fireman discussed with the patient undergoing left carotid  endarterectomy.  He discussed risks and benefits of this decision with the  patient.  The patient acknowledged understanding and agreed to proceed.   The patient was taken to the operating room on Sep 14, 2005, where he  underwent left carotid endarterectomy with Dacron patch angioplasty.  The  patient tolerated this  procedure well and was transferred up to the  intensive care unit in stable condition.  Following the patient's surgery he  was seen to be hemodynamically stable.  Postoperatively, urology had to be  consulted due to retention post TURP.  A 16-French Foley was placed and 800  mL of urine was returned.  Plan was to discontinue the Foley when  ambulating.  Postoperative day #1 the patient was without complaints.  He  did get out of bed and ambulate.  He did complain of some dizziness at that  time.  Following ambulation Foley was discontinued.  The patient was able to  void without difficulty following.  The patient continued to have dizziness  with ambulation.  It was felt to hold discharge at that time.  His vital  signs were monitored and seen to be stable.  He was able to be weaned off  oxygen, saturating greater than 90%.  The patient was seen to be  hemodynamically stable postoperatively.  Hemoglobin and hematocrit were 10.6  and 31.4.  The patient was transferred up to 2000 postoperative day #1.  The  next morning  the patient still with complaints of dizziness with ambulation.  This did improve with rest.  It was felt that the patient will require  another hospital day.  The patient remained in normal sinus rhythm  postoperatively.  Neuro intact.   The patient is tentatively ready for discharge home postoperative day #3,  Sep 17, 2005.  Follow-up appointment was scheduled with Dr. Madilyn Fireman for  __________, 2007 at 9:40 a.m.  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  He was told to ambulate three to  four times per day, progress as tolerated, and to continue his breathing  exercises.  He was told he is allowed to shower, wash his incisions using  soap and water.  He is to contact the office if he develops any drainage or  opening from any of his incision sites.  The patient was educated on diet to  be low-fat, low-salt.  He acknowledged  understanding.   DISCHARGE MEDICATIONS:  1.  Oxycodone 5 mg one to two tablets q.4-6h. p.r.n. pain.  2.  Lisinopril 40 mg daily.  3.  Nexium 40 mg daily.  4.  Norvasc 10 mg daily.  5.  Lasix 80 mg b.i.d.  6.  Plavix 75 mg daily.  7.  Aspirin 81 mg daily.  8.  Iron 325 mg b.i.d.  9.  Potassium chloride 20 mEq daily.  10. Cosopt one drop b.i.d.  11. Fentanyl patch 50 mcg q.72h.  12. Humalog 75/25 20 units b.i.d.  13. Aranesp daily.      Stephanie Acre Dominick, PA      P. Liliane Bade, M.D.  Electronically Signed    KMD/MEDQ  D:  09/16/2005  T:  09/17/2005  Job:  [id number]

## 2010-09-29 NOTE — Discharge Summary (Signed)
NAME:  [name], [name] NO.:  [id number]   MEDICAL RECORD NO.:  [id number]                   PATIENT TYPE:  INP   LOCATION:  5024                                 FACILITY:  [facility]   PHYSICIAN:  Loreta Ave, M.D.              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   DISCHARGE DIAGNOSES:  1.  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  2.  Urinary retention.  3.  Hyposmolarity.  4.  Cardiac pacemaker.  5.  Hypertension.  6.  Diabetes, non-insulin-dependent.  7.  Anemia.  8.  Hypokalemia.   PROCEDURE:  Left total knee replacement.   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  He is  now having to use a rolling walker for ambulation.  He has pain with every  step and is unable to do activities of daily living secondary to his pain,  radiographic end-stage DJD, left knee, for left total knee replacement.   HOSPITAL COURSE:  [age]-year-old black male admitted December 13, 2003.  After appropriate laboratory studies were obtained, as well as 1 g Ancef IV  on call to the operating room, he was taken to the operating room where he  underwent a left total knee replacement.  He tolerated the procedure well.  He was continued postoperatively on Ancef 1 g IV q.8 h. x3 doses.  A low-  dose Dilaudid PCA pump was used for pain management, heparin 5000 units  subcu q.12 h. until his Coumadin became therapeutic.  A Foley was placed  intraoperatively.  Consultations with PT, OT and rehab were made.  Ambulate  weightbearing as tolerated on the left.  CPM 0-30 degrees for 8-12 hours per  day was used also.  He was allowed out of bed to a chair the following day.  He had  his drains pulled on postop day 2.  His medical care was provided by  Dr. Chestine Spore.  He did have hyponatremia and normal saline was started at 125  mL/hr for 1 L and then 75 mL/hr thereafter.  Lantus insulin 30 units subcu  nightly was also ordered.  Urine for UA and culture was ordered.  He was  switched to Demerol 50 mg and Phenergan 12.5 mg IV every 4-6 hours as needed  for pain.  Percocet was discontinued.  Two blood cultures were ordered also  because of temperature elevation.  Hypokalemia was treated with 20 mEq of  Kay Ciel p.o. b.i.d.  His IV was discontinued on the 5th of August.  His  Lantus was also increased to 35 units subcu.  On the 5th of August, he was  also begun on Reglan 5 mg p.o. a.c.  Urine osmolality and sodium and  creatinine with serum osmolality, CMP, and vasopressor level with TSH and  free T4 were also obtained.  Fluids were restricted to less than 1000 mL per  24 hours.  Lasix was held on the 6th.  The Humalog insulin was discontinued  and Lantus 25 units subcu q.12 h. was begun.  New sliding-scale orders were  written on the 6th.  Darvocet-N 100 one to two q.4 h. p.r.n. pain was then  begun for oral medications.  Foley was discontinued on the 7th.  He was then  transferred to rehab on the 8th and in improved condition.  He will continue  with his physical and occupational therapy at that time.  Radiographic  studies reveal findings suggesting satisfactory total knee replacement on  the left.   LABORATORY STUDIES:  Admitted with a hemoglobin of 11.8, hematocrit 34.9%,  white count 7000, platelets 263,000.  Discharge hemoglobin 8.6, hematocrit  25.3%, white count 8200, platelets 348,000.  Occult blood per stools was  negative.  Reticulocyte count on December 18, 2003 was 2.4 with an absolute  reticulocyte of 64.3.  Discharge pro time was 21.4 with an INR of 2.4.  Preop electrolytes:  Sodium 134, potassium 3.8, chloride 99, CO2 30, glucose  254; BUN 19, creatinine 1.5,  calcium 8.8, total protein 6.7, albumin 3.7,  AST 34, ALT 26, ALP 67, total bilirubin 0.6.  Discharge sodium 130,  potassium 4.0, chloride 98, CO2 28, glucose 69, BUN 10, creatinine 1.1,  calcium 8.4; total protein 5.7, albumin 2.3, AST 51, ALT 37, ALP 54, total  bilirubin 0.5.  Serum osmolality was 267.  Urine osmolality of December 19, 2003 was 443.  Glycosylated hemoglobin was 9.2 on the 5th of August.  TSH on  the 7th of August was 1.144 with T4 of 1.45.  Iron of December 18, 2003 was 20,  TIBC 163, saturation of 12, B12 611, folate 712 and ferritin was 248.  Arginine vasopressin hormone on December 19, 2003 was less than 0.5.  Urine  creatinine of December 19, 2003 was 77.0 with a urine sodium of 111.  Preop  urinalysis was benign for a voided urine.  Urine of December 16, 2003 revealed  40 ketones, 30 protein with 0-2 whites and 0-2 reds.  Blood type was A-  positive, antibody screen negative.  Blood cultures x2 showed no growth.  Urine culture of  December 16, 2003 revealed no growth.  Culture of December 19, 2003, cathed specimen, revealed Enterococcus species sensitive to  amoxicillin, nitrofurantoin and vancomycin.   DISCHARGE INSTRUCTIONS:  He was transferred to rehab where he will continue  with his physical and occupational therapy.  He was discharged in improved  condition.      Oris Drone Petrarca, P.A.-C.                Loreta Ave, M.D.    BDP/MEDQ  D:  01/14/2004  T:  01/15/2004  Job:  [id number]

## 2010-09-29 NOTE — H&P (Signed)
NAME:  [name], [name] NO.:  [id number]   MEDICAL RECORD NO.:  [id number]                   PATIENT TYPE:  INP   LOCATION:  NA                                   [facility]ACILITY[facility]  [facility]   PHYSICIAN:  Lindaann Slough, M.D.               DATE OF BIRTH:  1918/09/09   DATE OF ADMISSION:  01/18/2004  DATE OF DISCHARGE:                                HISTORY & PHYSICAL   REASON FOR ADMISSION:  Urinary retention.   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  Since his surgery he has been unable to urinate on his  own.  He had been on Flomax, and he failed the voiding trials since then.  Cystoscopy showed trilobar prostatic hypertrophy.  He is admitted today for  TURP.   PAST MEDICAL HISTORY:  He has a history of diabetes, hypertension, glaucoma,  and heart disease.   PAST SURGICAL HISTORY:  He had a lumbar laminectomy in 2002.  He had a  pacemaker implanted in 1993, and that was upgraded in 2004.  He had a left  total knee replacement in August 2005.   MEDICATIONS:  1.  Protonix 40 mg daily.  2.  K-Dur 20 mEq daily.  3.  Lisinopril 40 mg daily.  4.  Lasix 40 mg daily.  5.  Lantus insulin 25 units in the morning, 18 units in the evening.  6.  Flomax 0.8 mg h.s.  7.  Norvasc 10 mg daily.  8.  Trinsicon one capsule twice a day.   ALLERGIES:  He has no known drug allergies.   SOCIAL HISTORY:  He is married.  Has smoked for several years and does not  drink.   FAMILY HISTORY:  His father and mother are deceased.   REVIEW OF SYSTEMS:  He has no cough, no shortness of breath, no hemoptysis.  CARDIOVASCULAR:  He has no palpitations of chest pain.  He has no nausea and  no vomiting, no diarrhea or constipation.  GENITOURINARY:  He had frequency  and urgency prior to surgery on his knee in August 2005, and since then he  has been unable to urinate on his own.   PHYSICAL EXAMINATION:  GENERAL:  This is a  well-built 77 year old male in no  acute distress.  VITAL SIGNS:  Blood pressure is 130/70, pulse 70, respirations 18,  temperature 97.  HEENT:  His head is normal.  He has a lens implant in the left eye.  He has  dentures.  NECK:  He has no cervical adenopathy.  He has no thyromegaly.  CHEST:  Lungs are clear to percussion and auscultation.  CARDIAC:  Regular rhythm.  ABDOMEN:  Protuberant.  He has no hepatomegaly, no splenomegaly.  The  bladder is not distended.  He has no inguinal hernia.  Bowel sounds are  normal.  GENITOURINARY:  Penis is uncircumcised.  He has a small ulceration at the  meatus secondary to Foley catheter.  Scrotum is normal in appearance.  He  has no hydrocele.  Both testicles and epididymis are within normal limits.  RECTAL:  Sphincter tone is normal.  Prostate is enlarged to 40-50% g.  No  nodules.  Seminal vesicles not palpable.  EXTREMITIES:  Within normal limits.  He has a healed scar of the left knee.   IMPRESSION:  1.  Urinary retention.  2.  Benign prostatic hypertrophy.  3.  Diabetes.  4.  Hypertension.                                               Lindaann Slough, M.D.    MN/MEDQ  D:  01/18/2004  T:  01/18/2004  Job:  [id number]

## 2010-09-29 NOTE — Consult Note (Signed)
NAME:  [name], [name] NO.:  [id number]   MEDICAL RECORD NO.:  [id number]                   PATIENT TYPE:  IPS   LOCATION:  4034                                 FACILITY:  [facility]   PHYSICIAN:  Lindaann Slough, M.D.               DATE OF BIRTH:  [birth year]   DATE OF CONSULTATION:  12/27/2003  DATE OF DISCHARGE:                                   CONSULTATION   REASON FOR CONSULTATION:  Difficulty voiding.   HISTORY OF PRESENT ILLNESS:  The patient is an 77 year old male well known  to me who had a left total knee replacement.  He has been having difficulty  voiding postoperatively.  He has been in and out catheterization for about  300 cc of urine each time.  He lasted voided this morning and after  urination he was catheterized for 300 cc.  He states he has not voided  since.  He has been on Flomax and he has been on 0.8 mg q.d. h.s.   PAST MEDICAL HISTORY:  He has a past medical history of diabetes and  hypertension.   PAST SURGICAL HISTORY:  He had a lumbar laminectomy and he is status post  left total knee replacement.   MEDICATIONS:  1. Warfarin.  2. Protonix 40 mg.  3. Potassium chloride 20 mEq.  4. Trinsicon 1 capsule b.i.d.  5. Lisinopril 40 mg q.d.  6. Reglan 5 mg t.i.d.  7. Lasix 20 mg q.d.  8. Novolog insulin.   ALLERGIES:  He has no known drug allergies.   PHYSICAL EXAMINATION:  ABDOMEN:  His abdomen is soft, nontender, and  nondistended.  No CVA tenderness.  Kidneys are not palpable.  His bladder is  not distended.  GENITOURINARY:  Penis and meatus are normal.  Scrotum is unremarkable.  He  has no hydrocele.  Testicles, cords, and epididymis are within normal  limits.  RECTAL:  Deferred.   LABORATORY DATA:  His BUN is 9, creatinine 1.1.  His blood pressure today is  152/75, pulse 69, respirations 20, temperature 98.1.  A catheterized for a  125 cc of urine.   DISCHARGE INSTRUCTIONS:  Because he has been catheterized several  times, he  wanted to have the Foley catheter left indwelling and we may be able to  remove the Foley in the morning and let him go home without a Foley catheter  and I will follow him as an outpatient.  He will continue to take Flomax two  capsules at night.  If he ever he has difficulty voiding, we will leave the  Foley catheter indwelling.                                              Lindaann Slough, M.D.   MN/MEDQ  D:  12/27/2003  T:  12/27/2003  Job:  [id number]

## 2010-09-29 NOTE — Procedures (Signed)
[facility]  Patient:    [name], [name]                       MRN: [id number] Proc. Date: 11/19/00 Adm. Date:  [id number] Attending:  Thyra Breed CC:         Genene Churn. Love, M.D.   Procedure Report  PROCEDURE:  Repeat lumbar epidural steroid injection.  DIAGNOSIS:  Lumbar spondylosis with history of spinal stenosis.  INTERVAL HISTORY:  The patient has noted overall improvement, and he rates his pain at 1/10 today.  He has out been playing golf in [place] since his last visit.  PHYSICAL EXAMINATION:  VITAL SIGNS:  Blood pressure 140/64, heart rate 70, respiratory rate 16, and O2 saturation 98%.  Pain level 1/10.  Temperature 97 degrees.  BACK:  He shows good healing from his previous injection site.  DESCRIPTION OF PROCEDURE:  After informed consent was obtained, the patient was placed in the sitting position and monitored.  His back was prepped with Betadine x 3.  A skin wheal was raised at the L3/L4 interspace with 1% lidocaine.  A 20-gauge Tuohy needle was introduced through the lumbar epidural space to loss of resistance to preservative-free normal saline.  There was no CSF or blood.  The depth was 7 cm.  40 mg of Medrol and 8 mL of preservative-free normal saline was gently injected.  The needle was flushed with preservative-free normal saline and removed intact.  POST-PROCEDURE CONDITION:  Stable.  DISCHARGE INSTRUCTIONS: 1. Resume previous diet. 2. Limitations in activities per instruction sheet. 3. Continue on current medications. 4. Followup with me in one to two weeks for repeat injection. DD:  11/19/00 TD:  11/19/00 Job: [id number]

## 2010-09-29 NOTE — Discharge Summary (Signed)
[name], [name]                          ACCOUNT NO.:  [id number]   MEDICAL RECORD NO.:  [id number]                   PATIENT TYPE:  INP   LOCATION:  0380                                 FACILITY:  [facility]   PHYSICIAN:  Lindaann Slough, M.D.               DATE OF BIRTH:  03/03/1919   DATE OF ADMISSION:  01/18/2004  DATE OF DISCHARGE:  01/20/2004                                 DISCHARGE SUMMARY   DISCHARGE DIAGNOSES:  1.  Urinary retention.  2.  Hypertension.  3.  Diabetes.  4.  Benign prostatic hypertrophy.   PROCEDURE DONE:  Cystoscopy and TURP on January 18, 2004.   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 He failed voiding trials. Cystoscopy showed  trilobar prostatic hypertrophy. He had a TURP on January 18, 2004.   On physical examination blood pressure is 130/70, pulse 70, respirations 18,  temperature 97. Lungs are clear to auscultation and percussion. Heart had a  regular rhythm. Abdomen was soft, nondistended, and nontender. No  hepatomegaly, no hepatosplenomegaly. Bowel sounds normal. Genitalia revealed  penis uncircumcised. He has a small ulceration at the meatus secondary to  Foley catheter. Scrotum is normal in appearance. Testicles and epididymis  are within normal limits.   His hemoglobin on admission was 9.8, hematocrit 29.9, and WBC 7.4. Sodium  132, potassium 4.6, glucose 160, BUN 18, creatinine 1.3. Urinalysis showed  few bacteria and urine culture showed 15,000 colonies of staph.   Chest x-ray showed no evidence of active disease and he has a pacemaker.   The patient had a TURP on January 18, 2004. The postoperative course was  uneventful. He was started on a liquid diet on the evening of the surgery  and he tolerated it well. The diet was gradually advanced.  He was treated  with vancomycin for staph UTI. His hemoglobin postoperatively was 8.2,  hematocrit 24.7. The patient remained  stable and therefore he was not given  any blood transfusion. He was started on iron tablets. On January 20, 2004,  the Foley catheter was removed. After removing the Foley he was voiding on  his own. He had urgency, but he was emptying his bladder. He was then  discharged home on Protonix 40 mg daily, K-Dur 20 mEq daily, lisinopril 40  mg daily, Lasix 40 mg daily, Norvasc 10 mg daily, Trinsicon one capsule  b.i.d., Lantus insulin 25 units in the morning and 18 units in the evening,  Macrobid 100 mg b.i.d., Pyridium Plus one tablet t.i.d.   The patient is instructed not to do any lifting, straining, or driving. He  will be followed in the office in about three weeks.   CONDITION ON DISCHARGE:  Improved.   DISCHARGE DIET:  Diabetic diet.  Lindaann Slough, M.D.    MN/MEDQ  D:  01/20/2004  T:  01/20/2004  Job:  [id number]   cc:   Madaline Savage, M.D.  [address], [address]  [place]  [place] [address]  Fax: [phone]   Margaretmary Bayley, M.D.  [address], [address]  [place]  [place] [address]  Fax: [phone]   Loreta Ave, M.D.  [address]  [place] [address]  Fax: [phone]

## 2010-09-29 NOTE — Discharge Summary (Signed)
NAME:  [name], HOLEMAN NO.:  [id number]   MEDICAL RECORD NO.:  [id number]                   PATIENT TYPE:  IPS   LOCATION:  4034                                 FACILITY:  [facility]   PHYSICIAN:  Erick Colace, M.D.           DATE OF BIRTH:  13-Jun-1918   DATE OF ADMISSION:  12/20/2003  DATE OF DISCHARGE:  12/28/2003                                 DISCHARGE SUMMARY   DISCHARGE DIAGNOSES:  1. Left total knee replacement secondary to degenerative joint disease     December 13, 2003.  2. Pain management.  3. Coumadin for deep venous thrombosis prophylaxis.  4. Anemia, resolving.  5. Hypertension.  6. Insulin-dependent diabetes mellitus.  7. Glaucoma.  8. Benign prostatic hypertrophy with urinary retention.  9. Hyponatremia, resolved.   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  Underwent a left total knee replacement August 1 per Dr.  Eulah Pont.  Placed on Coumadin for deep venous thrombosis prophylaxis and  weightbearing as tolerated.  Postoperative pain management, anemia,  hyponatremia of 130 with work-up of urine osmolality pending, history of  insulin-dependent diabetes mellitus.  His Lantus insulin was adjusted.  He  was still having some difficulty voiding after surgery.  He remained on  Flomax as prior to hospital admission.  He is admitted for a comprehensive  rehabilitation program.   PAST MEDICAL HISTORY:  See discharge diagnoses.   SOCIAL HISTORY:  Lives with his wife and son in [place].  One-level home,  two steps to entry.  Wife can assist on discharge.  Son works day shift.  He  was independent prior to admission with a walker and a cane.   MEDICATIONS PRIOR TO ADMISSION:  (Per old records)  1. Prevacid.  2. Lisinopril.  3. Hydrochlorothiazide.  4. Lasix.  5. Aspirin.  6. Flomax.  7. K-Dur.  8. Plendil.   Doses were not available.   HOSPITAL COURSE:  The patient with progressive gains while in rehabilitation  services with therapies initiated on a b.i.d. basis.  The following issues  were followed during patient's rehabilitation course.  Pertaining to Mr.  [name]'s left total knee replacement, surgical site healing nicely.  Staples  had been removed.  He was weightbearing as tolerated.  Supervision for his  ambulation.  Home health therapies had been arranged.  He would follow up  with Dr. Eulah Pont on discharge.  He remained on Coumadin for deep venous  thrombosis prophylaxis with no bleeding episodes.  Latest INR of 2.9.  He  would complete Coumadin protocol followed by [facility].  Postoperative anemia stable on iron supplement.  Latest hemoglobin 9.4,  hematocrit 27.5.  This showed a generous improvement from 8.7.  Blood  pressure still had some variables.  He had received follow-up per cardiology  services.  His Lasix had been increased to 40 mg daily.  He remained on his  lisinopril 40 mg daily.  Norvasc had been admitted to his regimen and  titrated to 10 mg.  Blood sugars had some range as his mobility and appetite  improved he would remain on his Lantus insulin with follow-up per Dr.  Margaretmary Bayley.  Dr. Brunilda Payor of urology services followed up for his urinary  retention with small voids of 200, I&O catheters of 400 mL.  His Flomax had  been increased to 0.8 mg.  It was advised a Foley catheter tube be placed on  December 27, 2003 with follow-up at the outpatient with Dr. Brunilda Payor and ongoing  voiding trials.  Overall, for his functional status as he was ambulating  supervision with a rolling walker he was still needing minimal assistance  for lower body dressing.  His wife would be able to provide the necessary  assistance at home.  Discharge was for December 28, 2003 with home health  therapies arranged.   Latest laboratories showed a sodium 132, potassium 5.4.  This was a   hemolyzed specimen.  BUN 9, creatinine 1.1.  Hemoglobin 9.4, hematocrit  27.5.   DISCHARGE MEDICATIONS:  1. Coumadin, latest dose of 5 mg to be completed January 13, 2004.  2. Trinsicon one capsule b.i.d.  3. Protonix 40 mg daily.  4. K-Dur 20 mEq daily.  5. Lisinopril 40 mg daily.  6. Lasix 40 mg daily.  7. Cosopt eye drops as prior to hospital admission.  8. Lantus insulin 25 units in the a.m., 18 units in the p.m.  9. Flomax 0.8 mg at bedtime.  10.      Norvasc 10 mg daily.  11.      Vicodin as needed pain.   ACTIVITY:  As tolerated.   DIET:  Diabetic diet.   WOUND CARE:  Cleanse incision daily, warm soap and water.   FOLLOWUP:  Home health therapy to follow per [facility] to complete Coumadin  protocol.  He would follow up with Dr. Richardson Landry, orthopedic service, call  for appointment.  Dr. Elsie Lincoln, cardiology services, for ongoing maintenance  of his blood pressure.  Dr. Margaretmary Bayley, medical management.  Dr. Brunilda Payor  concerning voiding trials.      Mariam Dollar, P.A.                     Erick Colace, M.D.    DA/MEDQ  D:  12/28/2003  T:  12/28/2003  Job:  [id number]   cc:   Loreta Ave, M.D.  [address]  [place] [address]  Fax: [phone]   Margaretmary Bayley, M.D.  [address]  [place]  [place] [address]  Fax: [phone]   Madaline Savage, M.D.  [phone] [address], [address]  [place]  [place] [address]  Fax: [phone]   Lindaann Slough, M.D.  [address]  [place]  [place] [address]  Fax: [phone]

## 2010-09-29 NOTE — Op Note (Signed)
NAME:  ISAMI, MEHRA NO.:  [id number]   MEDICAL RECORD NO.:  [id number]                   PATIENT TYPE:  INP   LOCATION:  0005                                 FACILITY:  [facility]   PHYSICIAN:  Lindaann Slough, M.D.               DATE OF BIRTH:  [birth year]   DATE OF PROCEDURE:  01/18/2004  DATE OF DISCHARGE:                                 OPERATIVE REPORT   PREOPERATIVE DIAGNOSES:  Urinary retention, BPH.   POSTOPERATIVE DIAGNOSES:  Urinary retention, benign prostatic hypertrophy.   PROCEDURE:  Cystoscopy, transurethral resection of prostate.   SURGEON:  Lindaann Slough, M.D.   ANESTHESIA:  General.   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  He had been on Flomax  prior to surgery, however, he had been unable to void and he failed 2  voiding trials.  Cystoscopy showed trilobar prostatic hypertrophy.  The  patient was admitted now for TURP.   DESCRIPTION OF PROCEDURE:  Under general anesthesia, the patient was prepped  and draped, placed in the dorsal lithotomy position.  A #22 Wappler  cystoscope was inserted into the bladder.  He has trilobar prostatic  hypertrophy, and the bladder is heavily trabeculated. There is no stone or  tumor in the bladder. The ureteral orifices are in the normal position and  shape with clear efflux.  The cystoscope was removed.   The urethra was dilated with #30 [place] Anuran sounds. Then a #28 continuous  flow resectoscope was inserted in the bladder.  Resection of the median lobe  was done first, and then resection of the prostate was done between the 2  and the 5 o'clock positions and between the 7 and 10 o'clock positions using  the bladder neck and the verumontanum as landmarks. Then the resection was  done between the 10 and the 2 o'clock positions, and the resection of the  median lobe was then completed between the 5 and  the 7 o'clock positions  using the same landmarks  Hemostasis was secured with electrocautery.  There  was minimal bleeding at the end of the procedure. The prostatic chips were  then irrigated out of the bladder, then the resectoscope was removed.  A #24  Foley catheter was then inserted in the bladder.  The patient tolerated the  procedure well, left the OR in satisfactory condition to the postanesthesia  care unit.                                               Lindaann Slough, M.D.    MN/MEDQ  D:  01/18/2004  T:  01/18/2004  Job:  [id number]   cc:   Margaretmary Bayley, M.D.  [address]  [place]  [place] [address]  Fax: [phone]   Loreta Ave, M.D.  [address]  [place] [address]  Fax: [phone]   Madaline Savage, M.D.  [phone] [address]  [place]  [place] [address]  Fax: [phone]

## 2010-09-29 NOTE — Op Note (Signed)
[name], CWIKLA NO.:  [id number]   MEDICAL RECORD NO.:  [id number]          PATIENT TYPE:  INP   LOCATION:  2023                         FACILITY:  [facility]   PHYSICIAN:  Balinda Quails, M.D.    DATE OF BIRTH:  Jun 20, 1918   DATE OF PROCEDURE:  09/14/2005  DATE OF DISCHARGE:                                 OPERATIVE REPORT   SURGEON:  Denman George, MD   ASSISTANT:  Charlesetta Garibaldi, PA   ANESTHETIC:  General endotracheal.   ANESTHESIOLOGISTS:  1.  Laverle Hobby, MD  2.  Jairo Ben, MD   DIAGNOSIS:  Severe left internal carotid artery stenosis.   POSTOPERATIVE DIAGNOSIS:  Severe left internal carotid artery stenosis.   PROCEDURE:  Left carotid endarterectomy and Dacron patch angioplasty.   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  This was verified by CT angiography.  He was seen in consultation  and felt to be a candidate for left carotid endarterectomy.  He is brought  to the operating room at this time for elective surgery.  He understands the  risks that are potential with this procedure with major morbidity and  mortality of 1% to 2%.   PROCEDURE NOTE:  The patient was brought to the operating room in stable  hemodynamic condition.  Arterial line in place.  Foley catheter could not be  easily passed.   He was placed under general endotracheal anesthesia in the supine position.  The left neck was prepped and draped in a sterile fashion.   Curvilinear skin incision was made along the anterior border of the left  sternomastoid muscle.  Dissection was carried down through subcutaneous  tissue with electrocautery.  Deep dissection was carried through the  platysma.  Bridging veins were ligated with 3-0 silk.  A large facial vein  was ligated with 2-0 silk and divided.  The common carotid artery was  mobilized to the level  of the omohyoid muscle and encircled with a  Vesseloop.  The common carotid artery was then followed distally up to the  carotid bifurcation.  The bifurcation of the common carotid artery was quite  high in the neck.  The posterior belly of the digastric muscle had to be  retracted superiorly.  The ansa hypoglossi was ligated and divided.  The  hypoglossal nerve was mobilized.  The origin of the superior thyroid and  external carotid were encircled with Vesseloops.  The carotid vessels were  noted be noted be large in caliber.  The internal carotid artery was then  followed distally.  The plaque could be easily felt and the internal carotid  artery mobilized beyond the plaque and encircled with a Vesseloop.   The patient was administered 7000 units of heparin intravenously.  Adequate  circulation time permitted.  The carotid vessels were controlled with  clamps.  A longitudinal arteriotomy was made in the distal common carotid  artery.  The arteriotomy extended across carotid bulb and up into the  internal  carotid artery.  It was a large space-occupying plaque in the  internal carotid artery with a high-grade stenosis.  The arteriotomy  extended beyond the plaque.  The shunt was then inserted.  An endarterectomy  elevator was used to remove the plaque.  The endarterectomy was carried down  into the common carotid artery, where the plaque was divided transversely  with Potts scissors.  The plaque raised up into the bulb, where the superior  thyroid and external carotid were endarterectomized using an eversion  technique.  The plaque then raised up into the internal carotid artery,  where it feathered out distally.  A good endpoint was created.  Fragments of  plaque were removed with fine forceps.  The site was irrigated with heparin  and saline solution.   The endarterectomy site was then patched with a finesse Dacron patch using  running 6-0 Prolene suture.  At completion of the patch  angioplasty, the  shunt was removed.  All vessels were well-flushed.  Clamps were removed,  directing the initial antegrade flow up the external carotid artery;  following this, the internal carotid was released.  Excellent pulse and  Doppler signal in the distal internal carotid artery.  The patient was  administered 50 mg of protamine intravenously.   Adequate hemostasis was obtained.  Sponge and instrument counts were  correct.  The sternomastoid fascia was closed with a running 2-0 Vicryl  suture.  The platysma was closed with a running 3-0 Vicryl suture.  The skin  was closed with 4-0 Monocryl.  Steri-Strips were applied.   The patient tolerated the procedure well.  Transferred to recovery room in  stable condition.      Balinda Quails, M.D.  Electronically Signed     PGH/MEDQ  D:  09/14/2005  T:  09/17/2005  Job:  [id number]   cc:   Madaline Savage, M.D.  Fax: [phone]   Margaretmary Bayley, M.D.  Fax: [phone]   Mina Marble, M.D.  Fax: [phone]

## 2010-09-29 NOTE — Discharge Summary (Signed)
NAME:  [name], DYAR NO.:  [id number]   MEDICAL RECORD NO.:  [id number]                   PATIENT TYPE:  OIB   LOCATION:  2027                                 FACILITY:  [facility]   PHYSICIAN:  Richard A. [name], M.D.          DATE OF BIRTH:  [birth year]   DATE OF ADMISSION:  10/29/2002  DATE OF DISCHARGE:  10/30/2002                                 DISCHARGE SUMMARY   DISCHARGE DIAGNOSES:  1. History of permanent pacemaker implanted in 1993, by Dr. Tanda Rockers.     Upgrade this admission for end-of-life.  2. Insulin-dependent diabetes.  3. Treated hypertension.  4. Peripheral vascular disease, on Pletal.   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  Apparently, the pacemaker was implanted after  some episodes of dizziness and bradycardia.  He was recently found to be at  end-of-life.  He was seen by Dr. [name] on October 28, 2002, and set up for  a generator change on October 29, 2002.  Please see Dr. Kandis Cocking office note  for complete details.  The patient had a St. Jude Identity generator  implanted without complications.  We feel he can be discharged on October 30, 2002.   DISCHARGE MEDICATIONS:  1. Zestoretic 20/12.5 mg one daily.  2. Hytrin 10 mg daily.  3. Lasix 40 mg daily.  4. Aspirin 81 mg daily.  5. Humalog 75/25 as taken at home.  6. Pletal 100 mg b.i.d.   LABORATORY DATA:  Sodium 138, potassium 3.7, BUN 22, creatinine 1.3, INR  0.9.  Hematology shows a white blood cell count of 5.9, hemoglobin 11.7,  platelets 197,000.  TSH 0.72.  T4 6.4, T3 83.6.  EKG shows atrial pacing,  ventricular tracking.   DISPOSITION:  The patient is discharged in stable condition, and will come  to the office on November 05, 2002, for a wound check.  A chest x-ray done on  October 29, 2002 shows no acute cardiopulmonary findings  and stable cardiac  enlargement.     Abelino Derrick, P.A.                      Richard A. [name], M.D.    Lenard Lance  D:  10/30/2002  T:  11/01/2002  Job:  [id number]   cc:   Margaretmary Bayley, M.D.  [address]  [place]  [place] [address]  Fax: [phone]   Eino Farber., M.D.  [address] [place]  [place] [address]  Fax: [phone]   Madaline Savage, M.D.  [phone] [address]  [place]  [place] [address]  Fax: [phone]    cc:   Margaretmary Bayley, M.D.  [address]  [place]  [place] [address]  Fax: [phone]   Eino Farber., M.D.  [address] [place]  [place] [address]  Fax: [phone]   Madaline Savage, M.D.  [phone] [address]  [place]  [place] [address]  Fax: [phone]

## 2010-09-29 NOTE — H&P (Signed)
NAMEFRANKEY, [name] NO.:  [id number]   MEDICAL RECORD NO.:  [id number]          PATIENT TYPE:  INP   LOCATION:  NA                           FACILITY:  [facility]   PHYSICIAN:  Theda Belfast, PA DATE OF BIRTH:  Mar 04, 1919   DATE OF ADMISSION:  DATE OF DISCHARGE:                                HISTORY & PHYSICAL   CHIEF COMPLAINT:  Nonsymptomatic left ICA stenosis.   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  The patient was recently hospitalized with rhabdomyolysis  secondary to statins.  During this hospitalization, carotid duplex  ultrasound was done which showed left ICA stenosis.  The patient was taken  by Dr. Jacinto Halim who performed a CT angiogram of the neck which showed high  grade stenosis of the proximal left ICA along with occluded left vertebral  artery.  The patient presents today for history and physical.  He currently  denies any muscle weakness, amaurosis fugax, slurred speech, difficulty  walking, headache, numbness, tingling, memory loss or confusion.  The  patient was seen and evaluated by Dr. Jacinto Halim on August 23, 2005.  He agreed  with the patient undergoing left carotid endarterectomy and cleared the  patient for surgery.  He did start the patient on Coreg at 12.5 mg twice a  day due to his uncontrolled hypertension, diabetes and hyperdynamic and a  very minimal abnormal Cardiolite stress test.   PAST MEDICAL HISTORY:  1.  Hypertension.  2.  Hyperlipidemia.  3.  Diabetes mellitus.  4.  Peripheral vascular disease.  5.  History of sick sinus syndrome status post permanent pacemaker      placement.   PAST SURGICAL HISTORY:  1.  Status post left knee replacement.  2.  Status post TURP.  3.  Status post cataract extractions.  4.  Status post permanent pacemaker.  5.  Status post laminectomy.   ALLERGIES:  The patient has  intolerance to CRESTOR and VYTORIN secondary to  myalgias.   MEDICATIONS:  1.  Lisinopril 40 mg daily.  2.  Nexium 40 mg daily.  3.  Norvasc 10 mg daily.  4.  Lasix 80 mg b.i.d.  5.  Plavix 75 mg daily.  6.  Aspirin 81 mg daily.  7.  Iron 325 mg twice a day.  8.  Potassium chloride 20 mEq daily.  9.  Cosopt one drop b.i.d.  10. Fentanyl patch 50 mcg q.72h.  11. Vicodin 7.5/500 one tablet 1 to 3 times per day.  12. Humalog 75/25 20 units b.i.d.  13. Aranesp.   SOCIAL HISTORY:  The patient is currently married with seven children and  lives at home with his wife.  The patient denies any tobacco or alcohol use.  He is a retired Development worker, international aid.   FAMILY HISTORY:  Mother positive for myocardial infarction, deceased at age  74.  Father positive for multiple myeloma, deceased at 82.   REVIEW OF SYSTEMS:  See HPI for pertinent positives and negatives.  The  patient  denies any recent fevers, night sweats, chills.  He denies any  recent changes in vision or hearing or difficulty swallowing.  Denies any  shortness of breath, cough, hemoptysis or wheezing.  Denies any chest pain,  palpitations, lightheadedness, dizziness, orthopnea or paroxysmal nocturnal  dyspnea.  Denies any nausea, vomiting, abdominal pain, changes in bowel  movements, diarrhea, constipation, melena, hemoptysis or hematochezia. He  denies any urgency, frequency, dysuria, hematuria.  The patient does have  history of peripheral vascular disease.  The patient states bilateral iliacs  do have blockages in them and has discussed with Dr. Madilyn Fireman undergoing stent  placement possibly in the future.   PHYSICAL EXAMINATION:  VITAL SIGNS:  Blood pressure 132/70, pulse is 75,  respirations 18.  GENERAL:  The patient is an 77 year old African-American male in no acute  distress.  HEENT:  Normocephalic, atraumatic.  Pupils equal, round, reactive to light  and accommodation.  Extraocular movements intact.  Oral mucosa is pink  and  moist.  NECK:  Is supple.  No carotid bruits noted.  CARDIAC:  Is regular rate and rhythm.  No murmurs, gallops or rubs noted.  There is a permanent pacemaker in the patient's left chest.  ABDOMEN:  Bowel sounds x4.  Soft, nontender on palpitation.  No  hepatosplenomegaly noted.  GENITALIA:  Deferred.  RECTAL:  Deferred.  EXTREMITIES:  The patient has 2 to 3+ pitting edema noted bilateral lower  extremities.  Extremities are warm to touch.  He has 2+ bilateral radial  pulses.  He has 1+ bilateral femoral pulses.  No palpable pulses are noted  in bilateral DP/PT pulses.  Noted that bilateral feet are warm to touch.  No  cyanosis or mottling noted.  NEUROLOGIC:  Cranial nerves II-XII intact.  Patient is alert and oriented  x4.  Gait is steady.  He uses a cane.  Muscle strength 5/5 noted in upper  and lower extremities bilaterally.   IMPRESSION AND PLAN:  The patient was seen with high-grade left ICA  stenosis.  The patient was seen and evaluated by Dr. Madilyn Fireman.  Dr. Madilyn Fireman  discussed with the patient undergoing left carotid endarterectomy versus  stent placement.  After further discussion and consideration by [name],  the patient felt to undergo left carotid endarterectomy.  Dr. Madilyn Fireman  discussed risks and benefits with the patient.  The patient acknowledged  understanding and agreed to proceed.  Surgery scheduled for Sep 14, 2005 by  Dr. Madilyn Fireman.  The patient is told if he develops any TIA or CVA symptoms to  contact the office or to present to the emergency room immediately.  The  patient acknowledges understanding.      Theda Belfast, PA     KMD/MEDQ  D:  09/12/2005  T:  09/12/2005  Job:  [id number]

## 2010-09-29 NOTE — Consult Note (Signed)
[name], [name]                ACCOUNT NO.:  [id number]   MEDICAL RECORD NO.:  [id number]          PATIENT TYPE:  INP   LOCATION:  1823                         FACILITY:  [facility]   PHYSICIAN:  Pramod P. Pearlean Brownie, MD    DATE OF BIRTH:  Feb 15, 1919   DATE OF CONSULTATION:  05/31/2004  DATE OF DISCHARGE:                                   CONSULTATION   REFERRING PHYSICIAN:  Margaretmary Bayley, M.D.   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  The patient described this as a sensation of room spinning and off balance  which occurs when he tries to sit up or get up.  This morning, he almost  fell, and when he tried to get back to the bed he slipped and fell on the  floor.  He was unable to pull himself up.  He complained of headache, but  denies significant nausea, vomiting, blurred vision, double vision, or focal  extremity weakness or numbness.  His imbalance has persisted since then, and  it worsens mainly when he tries to sit up or walk, and feels better when he  lies down.  He denies any prior history of stroke, TIA, but he does have  neurological problems.  He has chronic low back pain and lower extremity  paresthesias, significant degenerative lumbar spine disease, as well as  diabetic sensory polyneuropathy, for which he sees Dr. Sandria Manly.  He has  undergone back surgery in 1997 as well as has persistent pain and has  undergone multiple epidural steroid injections over the years, the last one  being in June 2005.  He does walk with a walker at baseline over the last  two to three months, with worsening of his gait, as well as back pain and  leg paresthesias.  Dr. Sandria Manly started him on Neurontin 100 mg b.i.d. a few  weeks ago.   PAST MEDICAL HISTORY:  1.  Diabetes.  2.  Hypertension.  3.  Glaucoma.  4.  Pacemaker.   PAST SURGICAL HISTORY:  1.  Lumbar laminectomy.  2.  Pacemaker  insertion.   MEDICATION ALLERGIES:  None.   MEDICATIONS:  1.  Hydrochlorothiazide.  2.  Lisinopril.  3.  Norvasc.  4.  Lasix.  5.  Neurontin.  6.  Insulin.  7.  Ambien.  8.  Protonix.  9.  Eye drops for the glaucoma.   REVIEW OF SYSTEMS:  Positive for increasing gait difficulties, back pain,  neck pain, tingling, numbness, and dizziness.   SOCIAL HISTORY:  The patient is retired.  Does not smoke or drink.  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   PHYSICAL EXAMINATION:  GENERAL:  Reveals a pleasant elderly African-American  gentleman, not in distress.  TEMPERATURE:  Afebrile.  PULSE RATE:  78 per minute, regular.  BLOOD PRESSURE:  In sitting position is 148/72; and when he stands, 178/94.  The patient complains of subjective dizziness and imbalance when he does so.  CARDIAC:  No murmur or gallop.  LUNGS:  Clear to auscultation.  ABDOMEN:  Soft, nontender.  NECK:  Reveals soft carotid bruits bilaterally, left greater than right.  NEUROLOGIC:  The patient is pleasant, awake, alert, cooperative.  There is  no aphasia, apraxia, or dysarthria.  Pupils are irregular but reactive.  His  __________.  Face is symmetric.  Palatal movements are normal.  Tongue is  midline.  Motor system exam reveals no upper extremity drift.  Symmetric  strength, tone.  Reflexes at both ankles and knee jerks are absent.  Plantars are downgoing.  He has some subjective diminished __________  pinprick sensation in both feet, ankle down.  Position sense __________.  He  has mild truncal ataxia when he tries to sit up, more pronounced when he  stands up and begins to walk.  He stands on a wide base and is unsteady.   DATA REVIEWED:  Noncontrast CT scan of the head done today reveals no acute  abnormalities.  Mild residual age-appropriate microangiopathic changes are  seen.   LABORATORIES:  CMET and CBC are normal.  Blood glucose is 159.   IMPRESSION:  An 77 year old male with sudden onset of dizziness,  gait and  balance difficulties, possibly from a small posterior circulation stroke not  visualized on CT scan.  Contributing factors to his imbalance would also  include his chronic back problems and diabetic polyneuropathy.   PLAN:  The patient will be admitted to Dr. [name] service for  further evaluation.  I would recommend repeating a CT scan of the head in  the morning, as we cannot do an MRI due to his pacemaker.  Check carotid and  transcranial Doppler studies.  Consult physical therapy for gait training.  We will follow the patient and make further recommendations as necessary.       PPS/MEDQ  D:  05/31/2004  T:  05/31/2004  Job:  [id number]

## 2010-09-29 NOTE — Op Note (Signed)
NAME:  [name], [name] NO.:  [id number]   MEDICAL RECORD NO.:  [id number]                   PATIENT TYPE:  INP   LOCATION:  5024                                 FACILITY:  [facility]   PHYSICIAN:  Loreta Ave, M.D.              DATE OF BIRTH:  11/07/1918   DATE OF PROCEDURE:  12/13/2003  DATE OF DISCHARGE:                                 OPERATIVE REPORT   PREOPERATIVE DIAGNOSIS:  End stage degenerative arthritis, left knee with  varus alignment and flexion contracture.   POSTOPERATIVE DIAGNOSIS:  End stage degenerative arthritis, left knee with  varus alignment and flexion contracture.   OPERATION PERFORMED:  Left total knee replacement with soft tissue balancing  including medial capsular release.  Cemented #9 posterior stabilized  Osteonics femoral component.  Cemented #9 tibial tray.  12 mm posterior  stabilized Flex polyethylene insert.  Cemented recessed 30 mm patellar  component.  Also grafting to cystic changes in femur and tibia medial  compartment with autologous cancellous graft from cuttings.   SURGEON:  Loreta Ave, M.D.   ASSISTANT:  Arlys John D. Petrarca, P.A.-C.   ANESTHESIA:  General.   ESTIMATED BLOOD LOSS:  Minimal.   TOURNIQUET TIME:  One hour and 30 minutes.   SPECIMENS:  Excised bone and soft tissue.   CULTURES:  None.   COMPLICATIONS:  None.   DRESSING:  Soft compressive with knee immobilizer.   DESCRIPTION OF PROCEDURE:  The patient was brought to the operating room and  placed on the operating table in supine position.  After adequate anesthesia  had been obtained, the left knee examined.  10 degrees varus only  correctable a couple of degrees.  More than a 7 degree flexion contracture,  further flexion 90 degrees.  Stable ligaments.  Tourniquet applied.  Leg  prepped and draped in the usual sterile fashion.  Exsanguinated with  elevation, Esmarch.  Tourniquet inflated to 350 mmHg.  Straight incision  above the  patella down to the tibial tubercle.  Medial parapatellar  arthrotomy.  Grade 4 changes throughout.  Periarticular spurs, loose bodies,  remnants of menisci, cruciate ligaments excised.  Cystic changes,  weightbearing distal end of the femur and tibia medial aspect, not too  extensive but definitely present.  Distal femur exposed.  Intramedullary  guide placed. Distal cut resecting 10 mm set at 5 degrees of valgus.  Curettage of the cystic areas.  Remaining jigs put in place after being  sized for #9 component.  Cuts made.  Cancellous bone was taken from cuttings  and packed into the curetted out defect and the weightbearing area of the  condyle.  Attention was turned to the tibia.  Tibial spine was removed with  a saw.  Intramedullary guide placed.  Proximal cut perpendicular to shaft,  resecting 5 to 6 mm off the medial side.  5 degree posterior slope cut.  Patella sized reamed  and drilled for a 30 mm component.  Knee copiously  irrigated.  The two areas on the femur and one large area on the tibia with  subchondral cystic change, all of these about 1 cm in diameter had been  thoroughly curetted and packed with cancellous graft.  Trials put in place.  There had been a generous medial capsular release to  be able to get a  balanced knee.  A #9 component on the tibia, #9 on the femur.  With a 12 mm  Flex insert, I had full extension, full flexion, good stability and no lift  off in flexion.  Good patellofemoral tracking with a 30 mm patellar  component.  Tibial was marked for appropriate rotation.  All trials removed.  The tibia was then hand reamed after appropriate rotation mark.  Once again  copious irrigation protecting the grafting on the tibia and femur.  Cement  prepared and placed on all components which were firmly seated after all  recesses had been examined to be sure all loose bodies removed.  Polyethylene attached to the tibia and the knee reduced.  Once the cement  hardened,  the knee was re-examined.  Again full extension, full flexion set  at 5 degrees of valgus with good patellofemoral tracking and good stability.  Wound irrigated.  Hemovacs placed and brought out through separate stab  wound.  Arthrotomy closed with #1 Vicryl.  Skin and subcutaneous tissue with  Vicryl and staples.  Margins of the wound and knee injected with Marcaine  and Hemovac clamped.  Sterile compressive dressing applied.  Tourniquet  deflated and removed.  Knee immobilizer applied.  Anesthesia reversed.  Brought to recovery room.  Tolerated surgery well.  No complications.                                               Loreta Ave, M.D.    DFM/MEDQ  D:  12/13/2003  T:  12/13/2003  Job:  [id number]

## 2010-09-29 NOTE — Op Note (Signed)
NAMERAYMONT, [name]                ACCOUNT NO.:  [id number]   MEDICAL RECORD NO.:  [id number]          PATIENT TYPE:  AMB   LOCATION:  DFTL                         FACILITY:  [facility]   PHYSICIAN:  Balinda Quails, M.D.    DATE OF BIRTH:  21-Apr-1919   DATE OF PROCEDURE:  10/26/2005  DATE OF DISCHARGE:  10/26/2005                                 OPERATIVE REPORT   PHYSICIAN:  Balinda Quails, M.D.   DIAGNOSIS:  Bilateral claudication.   PROCEDURE:  1. Abdominal aortogram with bilateral lower extremity runoff      arteriography.  2. Selective left lower extremity arteriogram.   ACCESS:  1. Right common femoral artery 5-French sheath.  2. Left common femoral vein 5-French sheath.   CONTRAST:  160 mL Visipaque.   COMPLICATIONS:  None apparent.   CLINICAL NOTE:  [name] is an 77 year old male with history of  bilateral claudication.  He does have also a history of spinal stenosis.  Doppler evaluation revealed evidence of possible iliac occlusive disease  bilaterally.  He agreed to undergo diagnostic arteriography and possible  intervention if an appropriate lesion was identified.   PROCEDURE NOTE:  The patient was brought to the cath lab in stable  condition.  He was placed in the supine position.  Due to a lack of  intravenous access, a left common femoral sheath was placed.  The skin and  subcutaneous tissues were instilled with 1% Xylocaine.  A needle was easily  introduced into the left common femoral vein.  A 0.035 J-wire was passed  through the needle.  The needle was removed and a 5-French venous sheath  advanced over the guidewire.  The sheath was flushed with heparin saline  solution and connected to intravenous for sedation.   The right common femoral artery was then accessed.  The skin and  subcutaneous tissues were instilled with 1% Xylocaine.  A needle easily  introduced in the right common femoral artery.  A 0.035 J-wire was passed  through the needle into the mid  abdominal aorta.  The needle was removed and  a 5-French sheath advanced over the guidewire.  The dilator was removed and  the sheath flushed with heparin saline solution.   A standard pigtail catheter was advanced over the guidewire to the mid  abdominal aorta.  A standard AP mid abdominal aortogram was obtained.  This  revealed widely patent single bilateral renal arteries.  The infrarenal  aorta was widely patent with mild atherosclerotic disease.   Pelvic arteriography was obtained.  This revealed mild areas of plaquing in  the common and external iliac arteries bilaterally.  There were no areas of  dominant stenosis identified in the iliac vessels bilaterally.   With the pigtail catheter at the bifurcation, lower extremity runoff  arteriography was obtained.  The right lower extremity revealed an intact  common femoral artery.  There was a stenosis at the origin of the profunda  femoris artery.  The superficial femoral artery revealed diffuse moderate  disease in its proximal and mid portions.  There was a subtotal occlusion of  the superficial femoral artery at the adductor canal.  There was moderate  disease of the popliteal artery which was patent.  The tibioperoneal trunk  and anterior tibial artery were moderately diseased.  Dominant runoff to the  lower extremity was via the right peroneal artery.   The left lower extremity revealed a patent common femoral artery.  The  profunda femoris artery was intact with extensive left thigh collaterals.  The left superficial femoral artery revealed a subtotal occlusion at the mid  thigh level.  This extended down into the adductor canal.  There was  reconstitution of the popliteal artery in the above-knee position.  The  patient had undergone a previous left total knee replacement.  The runoff in  the left popliteal artery appeared intact.  Runoff in the left lower  extremity was via the posterior tibial and peroneal vessels.   This  completed the arteriogram procedure.  There were no apparent  complications.  The right femoral sheath was removed.  The left femoral  venous sheath was left intact until the patient received adequate hydration.   FINAL IMPRESSION:  1. Single widely patent bilateral renal arteries.  2. Mild aortoiliac occlusive disease with no evidence of dominant      stenosis.  3. Bilateral superficial femoral occlusive disease with areas of subtotal      occlusion of the superficial femoral artery bilaterally.  4. Moderate bilateral tibial vessel occlusive disease.   DISPOSITION:  These results have been discussed with the patient.  At  present, it was recommended he not undergo any interventions at this time.      Balinda Quails, M.D.  Electronically Signed     PGH/MEDQ  D:  12/26/2005  T:  12/26/2005  Job:  [id number]   cc:   Madaline Savage, M.D.  Margaretmary Bayley, M.D.  Mina Marble, M.D.

## 2010-09-29 NOTE — Procedures (Signed)
[facility]  Patient:    [name], [name]                       MRN: [id number] Proc. Date: 11/05/00 Adm. Date:  [id number] Attending:  Thyra Breed CC:         Genene Churn. Love, M.D.  Lindell Spar. Chestine Spore, M.D.   Procedure Report  PROCEDURE:  Lumbar epidural steroid injection.  DIAGNOSIS:  Lumbar spondylosis with underlying history of spinal stenosis.  ANESTHESIOLOGIST:  Thyra Breed, M.D.  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 Rosalie Doctor and since then has required periodic epidural steroid injections.  He was last injected by the radiology group approximately a year ago via the caudal route.  He notes that this results in about three to four months worth of improvement.  He complains of a back discomfort which will radiate to the posterior aspects of his upper thighs.  It is brought on by walking approximately a block and improved by sitting down.  His ambulation is limited more by pain than by weakness.  He denied any bowel or bladder incontinence. He is actively followed by Dr. Sandria Manly.  His other medical problems include diabetes x 40 years for which he is followed by Dr. Margaretmary Bayley and is currently on insulin therapy, hypertension for about 20 years followed by Dr. Chestine Spore, osteoarthritis, history of pacemaker followed by Dr. Elsie Lincoln, and BPH followed by Dr. Brunilda Payor.  CURRENT MEDICATIONS:  Aspirin; insulin 70/30, 20 units in the morning and evening; Zestril; Hytrin; Lasix; Cardura; and Centrum Silver.  ALLERGIES:  No known drug allergies.  FAMILY HISTORY:  Positive for hypertension, osteoarthritis.  PAST SURGICAL HISTORY:  Significant for pacemaker placement in 1993 and decompressive surgery for lumbar spinal stenosis back in 1997.  ACTIVE MEDICAL PROBLEMS:  See HPI.  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, part time.  REVIEW OF SYSTEMS:  General: Negative.  Head: Negative.  Eyes: Significant for glaucoma.  Nose, Mouth, Throat: Negative.  Ears: Negative.  Lungs: Negative. Cardiovascular: Significant for high blood pressure x 20 years.   GI: Essentially negative except for some mild dyspepsia.  GU: Significant for BPH. Musculoskeletal and Neurologic: See HPI.  No history of seizure or stroke. Hematologic: The patient has an anemia with hemoglobin of 11 which he says is normocytic.  Cutaneous: Negative.  Endocrine: Positive for longstanding diabetes and a thyroid adenoma.   Psychiatric, Allergy and Immunologic: Negative.  PHYSICAL EXAMINATION:  VITAL SIGNS:  Blood pressure 142/52, heart rate 70, respiratory rate 16, O2 saturation 98%, pain level 4/10.  HEENT:  Head was normocephalic, atraumatic.  Eyes: Extraocular movements intact with conjunctivae and sclerae clear.  Nose: Patent nares.  Oropharynx demonstrated upper dental plate with mucosa intact.  NECK:  Supple without lymphadenopathy with a thyroid nodule measuring about 1 to 2 cm.  HEART:  Regular rate and rhythm with no carotid bruits.  LUNGS:  Clear.  ABDOMEN/GENITALIA/RECTAL:  Exams not performed.  BACK:  Exam revealed a well-healed surgical scar.  EXTREMITIES:  The patient demonstrated radial pulses and dorsalis pedis pulses 1 to 2+ and symmetric.  He had mild edema of the lower extremities with dry skin.  NEUROLOGIC:  The patient was oriented to person, place, time, and reason for visit.  Cranial nerves II-XII were significant for decreased hearing acuity but otherwise intact.   Deep tendon reflexes were symmetric in the upper and lower extremities with downgoing toes.  Initial extremity exam revealed that he may have evidence of a ruptured biceps tendon of the left upper extremity, and he has flexion contractures of the PIPs and DIPs of his  fingers suggestive of diabetic cheiroarthropathy.  IMPRESSION: 1. Low back pain on the basis of lumbar spondylosis with history of    decompressive laminectomy. 2. Other medical problems per Dr. Fraser Din which include insulin-dependent    diabetes, hypertension, benign prostatic hypertrophy, status post    pacemaker placement, and glaucoma.  DISPOSITION:  I discussed with the patient the potential risks, benefits, and limitations of an epidural steroid injection with the patient as well as reviewed the side effects of corticosteroid use.  He is interested in proceeding.  PROCEDURE:  After informed consent was obtained, the patient was placed in the sitting position and monitored.  His back was prepped with Betadine x 3.  A skin wheal was raised at the L3-4 interspace with 1% lidocaine.  A 20-gauge Tuohy needle was introduced in the lumbar epidural space to loss of resistance to preservative free normal saline.  The depth was 7 cm.  There was no CSF nor blood.  Medrol 40 mg and 8 ml preservative free normal saline was gently injected.  The needle was flushed with preservative free normal saline and removed intact.  POSTPROCEDURE CONDITION:  Stable.  DISCHARGE INSTRUCTIONS: 1. Resume previous diet. 2. Limitation of activities per instruction sheet. 3. Continue on current medications. 4. Follow up with me in one to two weeks for repeat injection. DD:  11/05/00 TD:  11/05/00 Job: [id number]

## 2010-09-29 NOTE — Discharge Summary (Signed)
NAMESILVIO, SAUSEDO NO.:  [id number]   MEDICAL RECORD NO.:  [id number]          PATIENT TYPE:  INP   LOCATION:  3019                         FACILITY:  [facility]   PHYSICIAN:  Margaretmary Bayley, M.D.    DATE OF BIRTH:  Apr 20, 1919   DATE OF ADMISSION:  05/31/2004  DATE OF DISCHARGE:  06/03/2004                                 DISCHARGE SUMMARY   DISCHARGE DIAGNOSES:  1.  Lumbar spinal stenosis.  2.  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  3.  Atherosclerotic heart disease, status post pacemaker.  4.  Degenerative joint disease.   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  The patient's history is relative in that he does have well documented  lumbar spinal stenosis and has been seen in the past by Dr. Sandria Manly.  Recently,  he has been found to be hypercholesterolemic and was started on a statin  agent, presumably by Dr. Chanda Busing who is his cardiologist but  according to the patient he has had no other complaints or problems.  His  walking has been compromised over the past couple of years because of his  lumbar spinal stenosis and related claudication along with a diagnosis of a  diabetic peripheral neuropathy.  He has had multiple epidural steroid  injections under the direction of Dr. Sandria Manly.  The patient states that with  these injections he has had some improvement in his pain but no complete  resolution and apparently has been using the fentanyl patch prescribed by  Dr. Sandria Manly.   PHYSICAL EXAMINATION:  GENERAL:  On physical exam, his gait is quite  difficult but he is in no obvious distress.  VITAL SIGNS:  His blood pressure is 138/74 with a pulse rate of 84,  respiratory rate 20 and unlabored, his temperature is 98.8.  HEENT:  There is no scleral icterus,  no conjunctival pallor, EOMs are full.  He has minimal background diabetic retinal changes and definitely no  proliferative changes.  His pharynx is without any exudates, no coating of  the tongue.  CHEST:  Blunting tenderness, no focal muscle tenderness.  CARDIAC:  He has a pacemaker in place, a regular rhythm, no gallops or rubs.  ABDOMEN:  Obese, soft, nontender.  GENITALIA/RECTAL:  Normal male, slightly atrophic testes, diminished  sphincter tone, stool is negative for occult blood.  EXTREMITIES:  No clubbing, cyanosis or edema.  NEUROLOGIC:  He is alert, oriented and cooperative.  He has decreased  strength in both lower extremities.  Decrease in vibration and pinprick in  the distal feet and ankles in a gloved stocking distribution.   PERTINENT LABS:  Admission white count is 6500, hematocrit 33.  Sed rate 34.  Glucose 159, BUN 25, sodium, potassium and chloride are normal as well as  his transaminases, magnesium.  He has a glycosylated hemoglobin of 9.8.  Admission CK is 468 with an MB of 6.1, relative index is  1.3, normal and  troponin 0.02.  His thyroid function studies are normal x2.  He also had a  normal B12 and folate level, cortisol AZTH and his PSA is 0.45.   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  It  is not felt that the patient had a syncopal episode and his neurologic  workup for a CVA is completely negative.  The patient was seen in  consultation by Dr. Sandria Manly and Dr. Pearlean Brownie of the neuro service.  Apparently the  diagnosis of a TIA was entertained and the patient subsequently underwent a  carotid artery Doppler which showed no significant ICA on the right.  He had  a 60-80% ICA stenosis on the left but this was not felt to be clinically  significant.  His EKG revealed a pacemaker-induced  rhythm without any acute  repolarization.  His CT of the lumbar spine was performed in place of the ER  MRI because of the patient's pacemaker.  He had moderate to severe central  and lateral recess stenosis on a multifactorial basis at L4-L5 and a  moderate degree at the level of L4.  His alignment, however, appeared to be  quite good.  With discontinuation of his Lipitor, the patient did quite  well, his pain was managed using his fentanyl patch and p.r.n. Dilaudid.  He  was scheduled to be seen by physical therapy as an outpatient and then  subsequently discharged on a condition that was much improved.  The patient  is to see Dr. Elsie Lincoln regarding the use of a different agent for control of  his cholesterol and he is to see Dr. Sandria Manly in followup in 2 weeks and he will  see me in followup in 3 weeks.   CONDITION ON DISCHARGE:  Improved.   PROGNOSIS:  Fair.   DIET:  1800 calorie restricted modified carbohydrate diet.      PC/MEDQ  D:  08/03/2004  T:  08/03/2004  Job:  [id number]

## 2010-11-14 ENCOUNTER — Other Ambulatory Visit (HOSPITAL_COMMUNITY): Payer: Self-pay | Admitting: Neurosurgery

## 2010-11-14 DIAGNOSIS — M48061 Spinal stenosis, lumbar region without neurogenic claudication: Secondary | ICD-10-CM

## 2010-11-30 ENCOUNTER — Ambulatory Visit (HOSPITAL_COMMUNITY)
Admission: RE | Admit: 2010-11-30 | Discharge: 2010-11-30 | Disposition: A | Discharge status: Home / Self Care | Payer: Medicare Other | Source: Ambulatory Visit | Attending: Neurosurgery | Admitting: Neurosurgery

## 2010-11-30 DIAGNOSIS — Z794 Long term (current) use of insulin: Secondary | ICD-10-CM | POA: Insufficient documentation

## 2010-11-30 DIAGNOSIS — M47817 Spondylosis without myelopathy or radiculopathy, lumbosacral region: Secondary | ICD-10-CM | POA: Insufficient documentation

## 2010-11-30 DIAGNOSIS — Z79899 Other long term (current) drug therapy: Secondary | ICD-10-CM | POA: Insufficient documentation

## 2010-11-30 DIAGNOSIS — M48061 Spinal stenosis, lumbar region without neurogenic claudication: Secondary | ICD-10-CM

## 2010-11-30 DIAGNOSIS — Z7982 Long term (current) use of aspirin: Secondary | ICD-10-CM | POA: Insufficient documentation

## 2010-11-30 MED ORDER — IOHEXOL 180 MG/ML  SOLN
20.0000 mL | Freq: Once | INTRAMUSCULAR | Status: AC | PRN
  Administered 2010-11-30: 20 mL via INTRATHECAL

## 2010-12-18 ENCOUNTER — Encounter (HOSPITAL_COMMUNITY)
Admission: RE | Admit: 2010-12-18 | Discharge: 2010-12-18 | Disposition: A | Discharge status: Home / Self Care | Payer: Medicare Other | Source: Ambulatory Visit | Attending: Neurosurgery | Admitting: Neurosurgery

## 2010-12-18 LAB — BASIC METABOLIC PANEL
BUN: 20 mg/dL (ref 6–23)
CO2: 30 mEq/L (ref 19–32)
Calcium: 9.7 mg/dL (ref 8.4–10.5)
Chloride: 97 mEq/L (ref 96–112)
Creatinine, Ser: 1.06 mg/dL (ref 0.50–1.35)
GFR calc Af Amer: >60 mL/min (ref >60)

## 2010-12-18 LAB — CBC
HCT: 35.8 % — ABNORMAL LOW (ref 39.0–52.0)
MCH: 30 pg (ref 26.0–34.0)
MCV: 91.1 fL (ref 78.0–100.0)
Platelets: 243 10*3/uL (ref 150–400)
RDW: 12.8 % (ref 11.5–15.5)
WBC: 7 10*3/uL (ref 4.0–10.5)

## 2010-12-18 LAB — SURGICAL PCR SCREEN: MRSA, PCR: NEGATIVE

## 2010-12-20 ENCOUNTER — Ambulatory Visit (HOSPITAL_COMMUNITY): Payer: Medicare Other

## 2010-12-20 ENCOUNTER — Ambulatory Visit (HOSPITAL_COMMUNITY)
Admission: RE | Admit: 2010-12-20 | Discharge: 2010-12-22 | Disposition: A | Discharge status: Home / Self Care | Payer: Medicare Other | Source: Ambulatory Visit | Attending: Neurosurgery | Admitting: Neurosurgery

## 2010-12-20 DIAGNOSIS — I2510 Atherosclerotic heart disease of native coronary artery without angina pectoris: Secondary | ICD-10-CM | POA: Insufficient documentation

## 2010-12-20 DIAGNOSIS — E119 Type 2 diabetes mellitus without complications: Secondary | ICD-10-CM | POA: Insufficient documentation

## 2010-12-20 DIAGNOSIS — M48062 Spinal stenosis, lumbar region with neurogenic claudication: Secondary | ICD-10-CM | POA: Insufficient documentation

## 2010-12-20 DIAGNOSIS — Z01812 Encounter for preprocedural laboratory examination: Secondary | ICD-10-CM | POA: Insufficient documentation

## 2010-12-20 DIAGNOSIS — M47817 Spondylosis without myelopathy or radiculopathy, lumbosacral region: Secondary | ICD-10-CM | POA: Insufficient documentation

## 2010-12-20 DIAGNOSIS — I10 Essential (primary) hypertension: Secondary | ICD-10-CM | POA: Insufficient documentation

## 2010-12-20 LAB — GLUCOSE, CAPILLARY
Glucose-Capillary: 176 mg/dL — ABNORMAL HIGH (ref 70–99)
Glucose-Capillary: 238 mg/dL — ABNORMAL HIGH (ref 70–99)

## 2010-12-21 LAB — GLUCOSE, CAPILLARY: Glucose-Capillary: 221 mg/dL — ABNORMAL HIGH (ref 70–99)

## 2010-12-22 LAB — GLUCOSE, CAPILLARY: Glucose-Capillary: 91 mg/dL (ref 70–99)

## 2011-01-09 ENCOUNTER — Emergency Department (HOSPITAL_COMMUNITY)
Admission: EM | Admit: 2011-01-09 | Discharge: 2011-01-10 | Disposition: A | Discharge status: Home / Self Care | Payer: Medicare Other | Attending: Emergency Medicine | Admitting: Emergency Medicine

## 2011-01-09 DIAGNOSIS — E119 Type 2 diabetes mellitus without complications: Secondary | ICD-10-CM | POA: Insufficient documentation

## 2011-01-09 DIAGNOSIS — Z794 Long term (current) use of insulin: Secondary | ICD-10-CM | POA: Insufficient documentation

## 2011-01-09 DIAGNOSIS — I10 Essential (primary) hypertension: Secondary | ICD-10-CM | POA: Insufficient documentation

## 2011-01-09 DIAGNOSIS — T383X1A Poisoning by insulin and oral hypoglycemic [antidiabetic] drugs, accidental (unintentional), initial encounter: Secondary | ICD-10-CM | POA: Insufficient documentation

## 2011-01-09 DIAGNOSIS — T38801A Poisoning by unspecified hormones and synthetic substitutes, accidental (unintentional), initial encounter: Secondary | ICD-10-CM | POA: Insufficient documentation

## 2011-01-09 LAB — GLUCOSE, CAPILLARY: Glucose-Capillary: 278 mg/dL — ABNORMAL HIGH (ref 70–99)

## 2011-01-10 NOTE — Discharge Summary (Signed)
  [name], COBBINS NO.:  [id number]  MEDICAL RECORD NO.:  [id number]  LOCATION:  3535                         FACILITY:  [facility]  PHYSICIAN:  Coletta Memos, M.D.     DATE OF BIRTH:  [birth year]  DATE OF ADMISSION:  12/20/2010 DATE OF DISCHARGE:  12/22/2010                              DISCHARGE SUMMARY   ADMITTING DIAGNOSES: 1. Lumbar stenosis at L2-3. 2. Neurogenic claudication. 3. Spondylosis of lumbar spine without myelopathy.  DISCHARGE DIAGNOSES: 1. Lumbar stenosis at L2-3. 2. Neurogenic claudication. 3. Spondylosis of lumbar spine without myelopathy.  PROCEDURE:  Lumbar laminectomy for decompression at L2-L3.  COMPLICATIONS:  None.  DISCHARGE STATUS:  Alive and well.  MEDICATIONS:  Pain medications will be hydrocodone 7.5/500 and Flexeril 10 mg p.o. t.i.d. p.r.n. spasms.  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  Recent myelogram revealed near cutoff of the dye at the L2-3 level.  He had a previous toe fusion at this level.  I therefore took him to the operating room and did an uncomplicated procedure.  Hemilaminectomy of L2 and of L3.  He had good decompression for this.  He had a previous left footdrop secondary to postoperative problems from a knee replacement on the left side.  He otherwise is doing well.  He is voiding, ambulating, tolerating a regular diet.  I will see him in 3-4 weeks.  Wound is clean, dry without signs of infection at discharge.  He will be discharged to home.          ______________________________ Coletta Memos, M.D.     KC/MEDQ  D:  12/22/2010  T:  12/22/2010  Job:  [id number]  Electronically Signed by Coletta Memos M.D. on 01/10/2011 04:17:45 PM

## 2011-01-10 NOTE — Op Note (Signed)
  [name], [name] NO.:  [id number]  MEDICAL RECORD NO.:  [id number]  LOCATION:  3535                         FACILITY:  [facility]  PHYSICIAN:  Coletta Memos, M.D.     DATE OF BIRTH:  Mar 13, 1919  DATE OF PROCEDURE:  12/20/2010 DATE OF DISCHARGE:  12/20/2010                              OPERATIVE REPORT   PREOPERATIVE DIAGNOSIS:  Lumbar stenosis L2-3.  POSTOPERATIVE DIAGNOSIS:  Lumbar stenosis L2-3.  PROCEDURE:  L2-L3 hemilaminectomies for lumbar decompression.  COMPLICATIONS:  None.  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  He is markedly stenotic at the L2-3 level.  As a result of an autofusion anteriorly, I felt that at this time it would be safe to proceed with a lumbar decompression there.  He is admitted for said operation.  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  His back was prepped and he was draped in a sterile fashion.  I infiltrated 8 mL of 0.5% lidocaine with 1:20,000 epinephrine.  I made a skin incision, opened up and exposed lamina of L2.  After confirming that with x-ray, I then proceeded with a hemilaminectomy of L2, removed some more bone from L3 which had regrown. I was able to fully decompress the thecal sac and L2 and L3 nerve roots bilaterally with Dr. Fredrich Birks assistance.  We irrigated the wound.  I then closed the wound in layered fashion using Vicryl sutures.  I also infiltrated long-acting Marcaine into the paraspinous musculature. Tolerated the procedure well.          ______________________________ Coletta Memos, M.D.     KC/MEDQ  D:  12/20/2010  T:  12/21/2010  Job:  [id number]  Electronically Signed by Coletta Memos M.D. on 01/10/2011 04:17:49 PM

## 2011-01-28 ENCOUNTER — Emergency Department (HOSPITAL_COMMUNITY): Payer: Medicare Other

## 2011-01-28 ENCOUNTER — Emergency Department (HOSPITAL_COMMUNITY)
Admission: EM | Admit: 2011-01-28 | Discharge: 2011-01-29 | Disposition: A | Discharge status: Home / Self Care | Payer: Medicare Other | Source: Home / Self Care | Attending: Emergency Medicine | Admitting: Emergency Medicine

## 2011-01-28 DIAGNOSIS — M25569 Pain in unspecified knee: Secondary | ICD-10-CM | POA: Insufficient documentation

## 2011-01-28 DIAGNOSIS — Y92009 Unspecified place in unspecified non-institutional (private) residence as the place of occurrence of the external cause: Secondary | ICD-10-CM | POA: Insufficient documentation

## 2011-01-28 DIAGNOSIS — W19XXXA Unspecified fall, initial encounter: Secondary | ICD-10-CM | POA: Insufficient documentation

## 2011-01-28 DIAGNOSIS — I10 Essential (primary) hypertension: Secondary | ICD-10-CM | POA: Insufficient documentation

## 2011-01-28 DIAGNOSIS — Z794 Long term (current) use of insulin: Secondary | ICD-10-CM | POA: Insufficient documentation

## 2011-01-28 DIAGNOSIS — M949 Disorder of cartilage, unspecified: Secondary | ICD-10-CM | POA: Insufficient documentation

## 2011-01-28 DIAGNOSIS — E119 Type 2 diabetes mellitus without complications: Secondary | ICD-10-CM | POA: Insufficient documentation

## 2011-01-28 DIAGNOSIS — M899 Disorder of bone, unspecified: Secondary | ICD-10-CM | POA: Insufficient documentation

## 2011-01-28 DIAGNOSIS — IMO0002 Reserved for concepts with insufficient information to code with codable children: Secondary | ICD-10-CM | POA: Insufficient documentation

## 2011-01-30 ENCOUNTER — Inpatient Hospital Stay (HOSPITAL_COMMUNITY)
Admission: RE | Admit: 2011-01-30 | Discharge: 2011-02-05 | DRG: 500 | Disposition: A | Discharge status: Rehabilitation Facility | Payer: Medicare Other | Source: Ambulatory Visit | Attending: Orthopaedic Surgery | Admitting: Orthopaedic Surgery

## 2011-01-30 DIAGNOSIS — K5900 Constipation, unspecified: Secondary | ICD-10-CM | POA: Diagnosis present

## 2011-01-30 DIAGNOSIS — D509 Iron deficiency anemia, unspecified: Secondary | ICD-10-CM | POA: Diagnosis present

## 2011-01-30 DIAGNOSIS — I509 Heart failure, unspecified: Secondary | ICD-10-CM | POA: Diagnosis present

## 2011-01-30 DIAGNOSIS — S838X9A Sprain of other specified parts of unspecified knee, initial encounter: Principal | ICD-10-CM | POA: Diagnosis present

## 2011-01-30 DIAGNOSIS — Z7982 Long term (current) use of aspirin: Secondary | ICD-10-CM

## 2011-01-30 DIAGNOSIS — E785 Hyperlipidemia, unspecified: Secondary | ICD-10-CM | POA: Diagnosis present

## 2011-01-30 DIAGNOSIS — Z7902 Long term (current) use of antithrombotics/antiplatelets: Secondary | ICD-10-CM

## 2011-01-30 DIAGNOSIS — X500XXA Overexertion from strenuous movement or load, initial encounter: Secondary | ICD-10-CM | POA: Diagnosis present

## 2011-01-30 DIAGNOSIS — Z950 Presence of cardiac pacemaker: Secondary | ICD-10-CM

## 2011-01-30 DIAGNOSIS — IMO0001 Reserved for inherently not codable concepts without codable children: Secondary | ICD-10-CM | POA: Diagnosis present

## 2011-01-30 DIAGNOSIS — Z794 Long term (current) use of insulin: Secondary | ICD-10-CM

## 2011-01-30 DIAGNOSIS — N400 Enlarged prostate without lower urinary tract symptoms: Secondary | ICD-10-CM | POA: Diagnosis present

## 2011-01-30 DIAGNOSIS — D72829 Elevated white blood cell count, unspecified: Secondary | ICD-10-CM | POA: Diagnosis present

## 2011-01-30 DIAGNOSIS — I10 Essential (primary) hypertension: Secondary | ICD-10-CM | POA: Diagnosis present

## 2011-01-30 DIAGNOSIS — E236 Other disorders of pituitary gland: Secondary | ICD-10-CM | POA: Diagnosis present

## 2011-01-30 DIAGNOSIS — I5033 Acute on chronic diastolic (congestive) heart failure: Secondary | ICD-10-CM | POA: Diagnosis not present

## 2011-01-30 LAB — PROTIME-INR
INR: 1.05 (ref 0.00–1.49)
Prothrombin Time: 13.9 seconds (ref 11.6–15.2)

## 2011-01-30 LAB — CBC
Hemoglobin: 10.8 g/dL — ABNORMAL LOW (ref 13.0–17.0)
MCH: 31.2 pg (ref 26.0–34.0)
MCV: 88.7 fL (ref 78.0–100.0)
RBC: 3.46 MIL/uL — ABNORMAL LOW (ref 4.22–5.81)
WBC: 7.1 10*3/uL (ref 4.0–10.5)

## 2011-01-30 LAB — COMPREHENSIVE METABOLIC PANEL
AST: 34 U/L (ref 0–37)
Albumin: 3.2 g/dL — ABNORMAL LOW (ref 3.5–5.2)
Alkaline Phosphatase: 58 U/L (ref 39–117)
BUN: 20 mg/dL (ref 6–23)
CO2: 25 mEq/L (ref 19–32)
Chloride: 91 mEq/L — ABNORMAL LOW (ref 96–112)
Creatinine, Ser: 0.97 mg/dL (ref 0.50–1.35)
GFR calc non Af Amer: >60 mL/min (ref >60)
Potassium: 5.1 mEq/L (ref 3.5–5.1)
Total Bilirubin: 0.7 mg/dL (ref 0.3–1.2)

## 2011-01-30 LAB — GLUCOSE, CAPILLARY
Glucose-Capillary: 195 mg/dL — ABNORMAL HIGH (ref 70–99)
Glucose-Capillary: 192 mg/dL — ABNORMAL HIGH (ref 70–99)

## 2011-01-31 DIAGNOSIS — M66259 Spontaneous rupture of extensor tendons, unspecified thigh: Secondary | ICD-10-CM

## 2011-01-31 LAB — GLUCOSE, CAPILLARY
Glucose-Capillary: 201 mg/dL — ABNORMAL HIGH (ref 70–99)
Glucose-Capillary: 206 mg/dL — ABNORMAL HIGH (ref 70–99)
Glucose-Capillary: 198 mg/dL — ABNORMAL HIGH (ref 70–99)

## 2011-02-01 ENCOUNTER — Inpatient Hospital Stay (HOSPITAL_COMMUNITY): Payer: Medicare Other

## 2011-02-01 DIAGNOSIS — J9600 Acute respiratory failure, unspecified whether with hypoxia or hypercapnia: Secondary | ICD-10-CM

## 2011-02-01 DIAGNOSIS — J810 Acute pulmonary edema: Secondary | ICD-10-CM

## 2011-02-01 DIAGNOSIS — I10 Essential (primary) hypertension: Secondary | ICD-10-CM

## 2011-02-01 LAB — BLOOD GAS, ARTERIAL
Bicarbonate: 27.7 mEq/L — ABNORMAL HIGH (ref 20.0–24.0)
Delivery systems: POSITIVE
Patient temperature: 98.6
TCO2: 29.1 mmol/L (ref 0–100)
pH, Arterial: 7.378 (ref 7.350–7.450)

## 2011-02-01 LAB — BASIC METABOLIC PANEL
Calcium: 8.8 mg/dL (ref 8.4–10.5)
GFR calc Af Amer: >60 mL/min (ref >60)
GFR calc non Af Amer: 60 mL/min — ABNORMAL LOW (ref >60)
Glucose, Bld: 196 mg/dL — ABNORMAL HIGH (ref 70–99)
Sodium: 129 mEq/L — ABNORMAL LOW (ref 135–145)

## 2011-02-01 LAB — DIFFERENTIAL
Basophils Absolute: 0 10*3/uL (ref 0.0–0.1)
Eosinophils Absolute: 0 10*3/uL (ref 0.0–0.7)
Lymphs Abs: 1.9 10*3/uL (ref 0.7–4.0)
Monocytes Absolute: 2 10*3/uL — ABNORMAL HIGH (ref 0.1–1.0)
Neutro Abs: 8.8 10*3/uL — ABNORMAL HIGH (ref 1.7–7.7)

## 2011-02-01 LAB — CBC
Hemoglobin: 10.1 g/dL — ABNORMAL LOW (ref 13.0–17.0)
MCH: 30.8 pg (ref 26.0–34.0)
MCHC: 34.5 g/dL (ref 30.0–36.0)
Platelets: 223 10*3/uL (ref 150–400)
RDW: 12.9 % (ref 11.5–15.5)

## 2011-02-01 LAB — CARDIAC PANEL(CRET KIN+CKTOT+MB+TROPI)
CK, MB: 3.9 ng/mL (ref 0.3–4.0)
Relative Index: 1.8 (ref 0.0–2.5)
Troponin I: <0.3 ng/mL (ref <0.30)
Troponin I: 0.35 ng/mL (ref <0.30)

## 2011-02-01 LAB — GLUCOSE, CAPILLARY: Glucose-Capillary: 186 mg/dL — ABNORMAL HIGH (ref 70–99)

## 2011-02-01 LAB — HEMOGLOBIN A1C: Hgb A1c MFr Bld: 8.5 % — ABNORMAL HIGH (ref <5.7)

## 2011-02-01 LAB — LACTIC ACID, PLASMA: Lactic Acid, Venous: 1.9 mmol/L (ref 0.5–2.2)

## 2011-02-02 ENCOUNTER — Inpatient Hospital Stay (HOSPITAL_COMMUNITY): Payer: Medicare Other

## 2011-02-02 DIAGNOSIS — M79609 Pain in unspecified limb: Secondary | ICD-10-CM

## 2011-02-02 DIAGNOSIS — R0602 Shortness of breath: Secondary | ICD-10-CM

## 2011-02-02 LAB — COMPREHENSIVE METABOLIC PANEL
ALT: 10 U/L (ref 0–53)
Calcium: 9.4 mg/dL (ref 8.4–10.5)
GFR calc Af Amer: >60 mL/min (ref >60)
Glucose, Bld: 126 mg/dL — ABNORMAL HIGH (ref 70–99)
Sodium: 132 mEq/L — ABNORMAL LOW (ref 135–145)
Total Protein: 6.8 g/dL (ref 6.0–8.3)

## 2011-02-02 LAB — CARDIAC PANEL(CRET KIN+CKTOT+MB+TROPI)
CK, MB: 3.1 ng/mL (ref 0.3–4.0)
Relative Index: 2.2 (ref 0.0–2.5)
Total CK: 142 U/L (ref 7–232)

## 2011-02-02 LAB — CBC
HCT: 31.5 % — ABNORMAL LOW (ref 39.0–52.0)
Hemoglobin: 10.9 g/dL — ABNORMAL LOW (ref 13.0–17.0)
RDW: 13.1 % (ref 11.5–15.5)
WBC: 13.2 10*3/uL — ABNORMAL HIGH (ref 4.0–10.5)

## 2011-02-02 LAB — BASIC METABOLIC PANEL
Calcium: 8.9 mg/dL (ref 8.4–10.5)
Chloride: 94 mEq/L — ABNORMAL LOW (ref 96–112)
Creatinine, Ser: 1 mg/dL (ref 0.50–1.35)
GFR calc Af Amer: >60 mL/min (ref >60)
GFR calc non Af Amer: >60 mL/min (ref >60)

## 2011-02-02 LAB — DIFFERENTIAL
Basophils Absolute: 0 10*3/uL (ref 0.0–0.1)
Lymphocytes Relative: 11 % — ABNORMAL LOW (ref 12–46)
Neutro Abs: 9.8 10*3/uL — ABNORMAL HIGH (ref 1.7–7.7)

## 2011-02-02 LAB — GLUCOSE, CAPILLARY: Glucose-Capillary: 126 mg/dL — ABNORMAL HIGH (ref 70–99)

## 2011-02-02 LAB — MAGNESIUM: Magnesium: 2.1 mg/dL (ref 1.5–2.5)

## 2011-02-02 NOTE — Op Note (Signed)
NAMECHRYSTIAN, CUPPLES NO.:  [id number]  MEDICAL RECORD NO.:  [id number]  LOCATION:  5020                         FACILITY:  [facility]  PHYSICIAN:  Lubertha Basque. Fleurette Woolbright, M.D.DATE OF BIRTH:  1918-08-08  DATE OF PROCEDURE:  01/30/2011 DATE OF DISCHARGE:                              OPERATIVE REPORT   PREOPERATIVE DIAGNOSIS:  Right quadriceps rupture.  POSTOPERATIVE DIAGNOSIS:  Right quadriceps rupture.  PROCEDURE:  Right quadriceps repair.  ANESTHESIA:  General.  ATTENDING SURGEON:  Lubertha Basque. 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  He was unable to actively extend and has large effusion.  He has an ultrasound study consistent with a complete quadriceps rupture.  He is offered repair in hopes of allowing his leg to actively straighten again. Informed operative consent was obtained after discussion of possible complications including reaction to anesthesia, infection, failure of repair, DVT, and death.  SUMMARY OF FINDINGS AND PROCEDURE:  Under general anesthesia through a longitudinal incision, a complete rupture of the quadriceps attachment at the superior pole of the patella was exposed.  The hemarthrosis was evacuated, followed by repair of the quadriceps through drill holes in the patella using #2 FiberWire.  Bryna Colander assisted throughout and was invaluable to the completion of the case in that he helped position and retract while I performed the procedure.  He also helped pass sutures making this possible.  He also helped to close less thereby minimizing our OR and tourniquet times.  DESCRIPTION OF PROCEDURE:  The patient was taken to the operating suite where general anesthetic was applied without difficulty.  He was positioned supine and prepped and draped in normal sterile fashion. After administration of IV  Kefzol, the right leg was elevated, exsanguinated, and tourniquet placed about the thigh.  A longitudinal incision was made with dissection down to quadriceps attachment at the superior pole of the patella.  This was completely avulsed along with portion of the retinaculum lateral.  The medial retinaculum was well maintained.  The tissue appeared to be quite healthy.  It was avulsed directly off the superior pole.  I freshened up the superior pole with a rongeur to a nice bleeding bone.  We then passed #2 FiberWire in [place] fashion in the quadriceps tendon with four limbs exiting distally.  I placed three longitudinal drill holes in the patella and pulled the sutures through these holes.  I tied the various limbs of the suture appropriately at the distal pole of the patella and seemed to reapproximate the quadriceps to the patella very well.  I flexed the knee and this repair was very stable.  I then placed a #1 Vicryl in the retinacular injury lateral with several figure-of-eight sutures placed. The tourniquet was deflated and a small amount of bleeding was easily controlled with Bovie cautery and some pressure.  The wound was irrigated, followed by reapproximation of subcutaneous tissues with 2-0 undyed Vicryl and skin closure with nylon.  Marcaine was injected about the incision site, followed by Adaptic, dry gauze, Ace wrap, and a  knee immobilizer.  Estimated blood loss and intraoperative fluids can be obtained from anesthesia records as can accurate tourniquet time.  DISPOSITION:  The patient was extubated in the operating room and taken to recovery room in stable addition.  He was to be admitted to the Orthopedic Surgery Service for appropriate postop care to include perioperative antibiotics and immediate mobilization and some mechanical treatments for DVT prophylaxis.     Lubertha Basque Jerl Santos, M.D.     PGD/MEDQ  D:  01/30/2011  T:  01/31/2011  Job:   [id number]  Electronically Signed by Marcene Corning M.D. on 02/02/2011 12:33:13 PM

## 2011-02-03 LAB — PHOSPHORUS: Phosphorus: 3 mg/dL (ref 2.3–4.6)

## 2011-02-03 LAB — GLUCOSE, CAPILLARY
Glucose-Capillary: 188 mg/dL — ABNORMAL HIGH (ref 70–99)
Glucose-Capillary: 259 mg/dL — ABNORMAL HIGH (ref 70–99)
Glucose-Capillary: 230 mg/dL — ABNORMAL HIGH (ref 70–99)
Glucose-Capillary: 120 mg/dL — ABNORMAL HIGH (ref 70–99)

## 2011-02-03 LAB — CARDIAC PANEL(CRET KIN+CKTOT+MB+TROPI)
Relative Index: 0.6 (ref 0.0–2.5)
Total CK: 520 U/L — ABNORMAL HIGH (ref 7–232)
Troponin I: <0.3 ng/mL (ref <0.30)

## 2011-02-03 LAB — BASIC METABOLIC PANEL
CO2: 30 mEq/L (ref 19–32)
Glucose, Bld: 177 mg/dL — ABNORMAL HIGH (ref 70–99)
Potassium: 4 mEq/L (ref 3.5–5.1)
Sodium: 131 mEq/L — ABNORMAL LOW (ref 135–145)

## 2011-02-03 LAB — MAGNESIUM: Magnesium: 2.1 mg/dL (ref 1.5–2.5)

## 2011-02-04 LAB — BASIC METABOLIC PANEL
BUN: 21 mg/dL (ref 6–23)
CO2: 33 mEq/L — ABNORMAL HIGH (ref 19–32)
Calcium: 9.3 mg/dL (ref 8.4–10.5)
Chloride: 90 mEq/L — ABNORMAL LOW (ref 96–112)
Creatinine, Ser: 1.1 mg/dL (ref 0.50–1.35)

## 2011-02-04 LAB — GLUCOSE, CAPILLARY
Glucose-Capillary: 189 mg/dL — ABNORMAL HIGH (ref 70–99)
Glucose-Capillary: 199 mg/dL — ABNORMAL HIGH (ref 70–99)
Glucose-Capillary: 218 mg/dL — ABNORMAL HIGH (ref 70–99)
Glucose-Capillary: 206 mg/dL — ABNORMAL HIGH (ref 70–99)

## 2011-02-04 LAB — CBC
HCT: 30.3 % — ABNORMAL LOW (ref 39.0–52.0)
MCH: 30.3 pg (ref 26.0–34.0)
MCV: 89.1 fL (ref 78.0–100.0)
Platelets: 277 10*3/uL (ref 150–400)
RBC: 3.4 MIL/uL — ABNORMAL LOW (ref 4.22–5.81)

## 2011-02-05 ENCOUNTER — Inpatient Hospital Stay (HOSPITAL_COMMUNITY): Payer: Medicare Other

## 2011-02-05 ENCOUNTER — Inpatient Hospital Stay (HOSPITAL_COMMUNITY)
Admission: RE | Admit: 2011-02-05 | Discharge: 2011-02-21 | DRG: 945 | Disposition: A | Discharge status: Home Health | Payer: Medicare Other | Source: Other Acute Inpatient Hospital | Attending: Physical Medicine & Rehabilitation | Admitting: Physical Medicine & Rehabilitation

## 2011-02-05 DIAGNOSIS — Z794 Long term (current) use of insulin: Secondary | ICD-10-CM

## 2011-02-05 DIAGNOSIS — I2510 Atherosclerotic heart disease of native coronary artery without angina pectoris: Secondary | ICD-10-CM

## 2011-02-05 DIAGNOSIS — Z5189 Encounter for other specified aftercare: Principal | ICD-10-CM

## 2011-02-05 DIAGNOSIS — Z950 Presence of cardiac pacemaker: Secondary | ICD-10-CM

## 2011-02-05 DIAGNOSIS — F99 Mental disorder, not otherwise specified: Secondary | ICD-10-CM

## 2011-02-05 DIAGNOSIS — D649 Anemia, unspecified: Secondary | ICD-10-CM

## 2011-02-05 DIAGNOSIS — S838X9A Sprain of other specified parts of unspecified knee, initial encounter: Secondary | ICD-10-CM

## 2011-02-05 DIAGNOSIS — E1149 Type 2 diabetes mellitus with other diabetic neurological complication: Secondary | ICD-10-CM

## 2011-02-05 DIAGNOSIS — I509 Heart failure, unspecified: Secondary | ICD-10-CM

## 2011-02-05 DIAGNOSIS — Z7902 Long term (current) use of antithrombotics/antiplatelets: Secondary | ICD-10-CM

## 2011-02-05 DIAGNOSIS — Z96659 Presence of unspecified artificial knee joint: Secondary | ICD-10-CM

## 2011-02-05 DIAGNOSIS — M66259 Spontaneous rupture of extensor tendons, unspecified thigh: Secondary | ICD-10-CM

## 2011-02-05 DIAGNOSIS — X500XXA Overexertion from strenuous movement or load, initial encounter: Secondary | ICD-10-CM

## 2011-02-05 DIAGNOSIS — E785 Hyperlipidemia, unspecified: Secondary | ICD-10-CM

## 2011-02-05 DIAGNOSIS — K5900 Constipation, unspecified: Secondary | ICD-10-CM

## 2011-02-05 DIAGNOSIS — M11239 Other chondrocalcinosis, unspecified wrist: Secondary | ICD-10-CM

## 2011-02-05 DIAGNOSIS — S86819A Strain of other muscle(s) and tendon(s) at lower leg level, unspecified leg, initial encounter: Secondary | ICD-10-CM

## 2011-02-05 DIAGNOSIS — N138 Other obstructive and reflux uropathy: Secondary | ICD-10-CM

## 2011-02-05 DIAGNOSIS — E1142 Type 2 diabetes mellitus with diabetic polyneuropathy: Secondary | ICD-10-CM

## 2011-02-05 DIAGNOSIS — Z7982 Long term (current) use of aspirin: Secondary | ICD-10-CM

## 2011-02-05 DIAGNOSIS — Z87891 Personal history of nicotine dependence: Secondary | ICD-10-CM

## 2011-02-05 DIAGNOSIS — E236 Other disorders of pituitary gland: Secondary | ICD-10-CM

## 2011-02-05 DIAGNOSIS — N401 Enlarged prostate with lower urinary tract symptoms: Secondary | ICD-10-CM

## 2011-02-05 DIAGNOSIS — I10 Essential (primary) hypertension: Secondary | ICD-10-CM

## 2011-02-05 DIAGNOSIS — Z79899 Other long term (current) drug therapy: Secondary | ICD-10-CM

## 2011-02-05 DIAGNOSIS — Z9889 Other specified postprocedural states: Secondary | ICD-10-CM

## 2011-02-05 DIAGNOSIS — M216X9 Other acquired deformities of unspecified foot: Secondary | ICD-10-CM

## 2011-02-05 DIAGNOSIS — R339 Retention of urine, unspecified: Secondary | ICD-10-CM

## 2011-02-05 DIAGNOSIS — Z23 Encounter for immunization: Secondary | ICD-10-CM

## 2011-02-05 DIAGNOSIS — M25539 Pain in unspecified wrist: Secondary | ICD-10-CM

## 2011-02-05 DIAGNOSIS — F411 Generalized anxiety disorder: Secondary | ICD-10-CM

## 2011-02-05 LAB — BASIC METABOLIC PANEL
Calcium: 9.1 mg/dL (ref 8.4–10.5)
GFR calc Af Amer: >60 mL/min (ref >60)
GFR calc non Af Amer: >60 mL/min (ref >60)
Glucose, Bld: 299 mg/dL — ABNORMAL HIGH (ref 70–99)
Potassium: 4.1 mEq/L (ref 3.5–5.1)
Sodium: 130 mEq/L — ABNORMAL LOW (ref 135–145)

## 2011-02-05 LAB — CBC
Hemoglobin: 11.4 g/dL — ABNORMAL LOW (ref 13.0–17.0)
MCH: 31.2 pg (ref 26.0–34.0)
MCHC: 35.5 g/dL (ref 30.0–36.0)
Platelets: 236 10*3/uL (ref 150–400)
RDW: 12.8 % (ref 11.5–15.5)

## 2011-02-05 LAB — URINALYSIS, MICROSCOPIC ONLY
Bilirubin Urine: NEGATIVE
Glucose, UA: 500 mg/dL — AB
Ketones, ur: NEGATIVE mg/dL
pH: 7.5 (ref 5.0–8.0)

## 2011-02-05 LAB — GLUCOSE, CAPILLARY
Glucose-Capillary: 200 mg/dL — ABNORMAL HIGH (ref 70–99)
Glucose-Capillary: 318 mg/dL — ABNORMAL HIGH (ref 70–99)
Glucose-Capillary: 262 mg/dL — ABNORMAL HIGH (ref 70–99)

## 2011-02-06 DIAGNOSIS — I509 Heart failure, unspecified: Secondary | ICD-10-CM

## 2011-02-06 DIAGNOSIS — M66259 Spontaneous rupture of extensor tendons, unspecified thigh: Secondary | ICD-10-CM

## 2011-02-06 DIAGNOSIS — I10 Essential (primary) hypertension: Secondary | ICD-10-CM

## 2011-02-06 LAB — GLUCOSE, CAPILLARY: Glucose-Capillary: 423 mg/dL — ABNORMAL HIGH (ref 70–99)

## 2011-02-06 LAB — CBC
Hemoglobin: 11.3 g/dL — ABNORMAL LOW (ref 13.0–17.0)
MCHC: 34.5 g/dL (ref 30.0–36.0)
RBC: 3.7 MIL/uL — ABNORMAL LOW (ref 4.22–5.81)

## 2011-02-06 LAB — DIFFERENTIAL
Basophils Absolute: 0 10*3/uL (ref 0.0–0.1)
Basophils Relative: 0 % (ref 0–1)
Neutro Abs: 6 10*3/uL (ref 1.7–7.7)
Neutrophils Relative %: 69 % (ref 43–77)

## 2011-02-06 LAB — URINE CULTURE: Culture: NO GROWTH

## 2011-02-06 LAB — COMPREHENSIVE METABOLIC PANEL
ALT: 22 U/L (ref 0–53)
Alkaline Phosphatase: 70 U/L (ref 39–117)
GFR calc Af Amer: >60 mL/min (ref >60)
Glucose, Bld: 274 mg/dL — ABNORMAL HIGH (ref 70–99)
Potassium: 3.9 mEq/L (ref 3.5–5.1)
Sodium: 129 mEq/L — ABNORMAL LOW (ref 135–145)
Total Protein: 6.9 g/dL (ref 6.0–8.3)

## 2011-02-06 NOTE — Consult Note (Signed)
[name], [name] NO.:  [id number]  MEDICAL RECORD NO.:  [id number]  LOCATION:  2550                         FACILITY:  [facility]  PHYSICIAN:  Erick Blinks, MD     DATE OF BIRTH:  August 23, 1918  DATE OF CONSULTATION:  01/30/2011 DATE OF DISCHARGE:                                CONSULTATION   REQUESTING PHYSICIAN:  Lubertha Basque. Jerl Santos, MD, Orthopedics.  REASON FOR CONSULTATION:  Medical management of diabetes, hypertension, and other medical problems.  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  The patient reports that after having a mechanical fall, he suffered a right quadriceps tendon tear and was subsequently admitted for further surgical management of this.  He denies any recent chest pain or shortness of breath.  He has had some nausea and reports that he has not had a bowel movement approximately 3 days.  He does report having problems with constipation in the past as well and denies any dysuria, no abdominal pain, no shortness of breath, cough, or fever.  Otherwise, no other complaints.  He feels that he generally is doing pretty well. The patient underwent operative repair for his quadriceps tendon injury. He was seen in the PACU for further medical management.  He reports that he has not taken his regular medication today including his blood pressure medication and his main complaint at this time is constipation.  PAST MEDICAL HISTORY: 1. Chronic diastolic congestive heart failure. 2. Hypertension. 3. Insulin-dependent diabetes. 4. Spinal stenosis with recent laminectomy on L2-L3 approximately 6     weeks ago by Dr. Franky Macho. 5. Benign prostatic hypertrophy. 6. Hyperlipidemia. 7. Complete heart block status post pacemaker. 8. Iron-deficiency anemia. 9. Chronic hyponatremia, previously worked up and felt to be syndrome     of inappropriate ADH  secretion.  MEDICATIONS PRIOR TO ADMISSION: 1. MiraLax. 2. Plavix 75 mg daily. 3. Avodart 0.5 mg daily. 4. Timolol ophthalmic solution every morning. 5. Systane eye drops as needed. 6. Potassium chloride daily. 7. Lortab as needed. 8. Lorazepam 0.5 mg 1/2 tablet twice daily. 9. Lantus 15 units daily at bedtime. 10.Isosorbide mononitrate XR 30 mg 1 tablet daily. 11.Humalog 4 units with breakfast and 16 units with dinner. 12.Lasix 80 mg daily. 13.Iron 1 tablet daily. 14.Diovan 160 mg twice daily. 15.Enteric-coated aspirin 81 mg 2 tablets twice daily. 16.Amlodipine 10 mg 1 tablet daily.  SOCIAL HISTORY:  The patient denies smoking cigarettes, drinking alcohol, or using any illegal drugs.  FAMILY HISTORY:  Reviewed and noncontributory.  REVIEW OF SYSTEMS:  All systems are reviewed and pertinent positives in the HPI, otherwise negative.  PHYSICAL EXAMINATION:  VITAL SIGNS:  The patient is afebrile, heart rate is in the 70s, blood pressure elevated in the 180s range, pulse ox 97% on 2 L of oxygen. GENERAL:  The patient is in no acute distress, lying comfortably in bed. Denies any significant pain at this time. HEENT:  Normocephalic and atraumatic.  Pupils are equal, round, and react to light. NECK:  Supple. CHEST:  Some bibasilar crackles. CARDIAC:  S1 and S2 with regular rate and rhythm. ABDOMEN:  Soft and nontender.  Bowel sounds are active. EXTREMITIES:  Trace edema bilaterally.  LABORATORY DATA:  Sodium 127, potassium 5.1, chloride 91, bicarb 25, glucose 202, BUN 20, and creatinine 0.97.  Liver function tests are within normal limits.  INR 1.05.  WBC 7.1, hemoglobin 10.8, and platelet count 236.  ASSESSMENT AND PLAN: 1. Quadriceps tendon tear status post repair. 2. Uncontrolled hypertension. 3. Insulin-dependent type 2 diabetes. 4. Hypernatremia. 5. Constipation. 6. Benign prostatic hypertrophy. 7. Chronic diastolic congestive heart failure. 8. Iron-deficiency  anemia.. 9. History of syndrome of inappropriate antidiuretic hormone     secretion.  RECOMMENDATIONS:  At this time, we would recommend to continue the patient's regular blood pressure medications as he reports that he is generally well controlled on this.  His pressure will likely be elevated due to elements of pain but at this time he reports that he is relatively comfortable.  We will lock his IV and not give him any further foods as this may exacerbate his hyponatremia.  We will start him back on his Lasix and place him on a 800 mL fluid restriction diet. Regarding his diabetes, we will continue his home regimen, place him sliding-scale insulin and CBGs every meals and at bedtime.  PT/OT will be ordered for him and regarding his constipation, we will give him Fleet's enema at this time and continue his regular MiraLax.  Thank you for this consultation.  We will continue to follow with you.     Erick Blinks, MD     JM/MEDQ  D:  01/30/2011  T:  01/30/2011  Job:  [id number]  Electronically Signed by Durward Mallard Saylee Sherrill  on 02/06/2011 09:12:13 PM

## 2011-02-07 LAB — GLUCOSE, CAPILLARY
Glucose-Capillary: 353 mg/dL — ABNORMAL HIGH (ref 70–99)
Glucose-Capillary: 298 mg/dL — ABNORMAL HIGH (ref 70–99)
Glucose-Capillary: 261 mg/dL — ABNORMAL HIGH (ref 70–99)

## 2011-02-08 ENCOUNTER — Inpatient Hospital Stay (HOSPITAL_COMMUNITY): Payer: Medicare Other

## 2011-02-08 LAB — GLUCOSE, CAPILLARY: Glucose-Capillary: 230 mg/dL — ABNORMAL HIGH (ref 70–99)

## 2011-02-09 DIAGNOSIS — I10 Essential (primary) hypertension: Secondary | ICD-10-CM

## 2011-02-09 DIAGNOSIS — M66259 Spontaneous rupture of extensor tendons, unspecified thigh: Secondary | ICD-10-CM

## 2011-02-09 DIAGNOSIS — E1165 Type 2 diabetes mellitus with hyperglycemia: Secondary | ICD-10-CM

## 2011-02-09 DIAGNOSIS — I509 Heart failure, unspecified: Secondary | ICD-10-CM

## 2011-02-09 LAB — BASIC METABOLIC PANEL
BUN: 15 mg/dL (ref 6–23)
CO2: 29 mEq/L (ref 19–32)
Calcium: 9.8 mg/dL (ref 8.4–10.5)
GFR calc non Af Amer: >60 mL/min (ref >60)
Glucose, Bld: 179 mg/dL — ABNORMAL HIGH (ref 70–99)
Sodium: 130 mEq/L — ABNORMAL LOW (ref 135–145)

## 2011-02-09 LAB — GLUCOSE, CAPILLARY
Glucose-Capillary: 167 mg/dL — ABNORMAL HIGH (ref 70–99)
Glucose-Capillary: 235 mg/dL — ABNORMAL HIGH (ref 70–99)

## 2011-02-09 LAB — CBC
Platelets: 371 10*3/uL (ref 150–400)
RBC: 3.37 MIL/uL — ABNORMAL LOW (ref 4.22–5.81)
WBC: 6.4 10*3/uL (ref 4.0–10.5)

## 2011-02-09 LAB — PRO B NATRIURETIC PEPTIDE: Pro B Natriuretic peptide (BNP): 4917 pg/mL — ABNORMAL HIGH (ref 0–450)

## 2011-02-10 LAB — GLUCOSE, CAPILLARY
Glucose-Capillary: 174 mg/dL — ABNORMAL HIGH (ref 70–99)
Glucose-Capillary: 270 mg/dL — ABNORMAL HIGH (ref 70–99)
Glucose-Capillary: 97 mg/dL (ref 70–99)

## 2011-02-11 LAB — GLUCOSE, CAPILLARY
Glucose-Capillary: 161 mg/dL — ABNORMAL HIGH (ref 70–99)
Glucose-Capillary: 107 mg/dL — ABNORMAL HIGH (ref 70–99)

## 2011-02-12 DIAGNOSIS — M66259 Spontaneous rupture of extensor tendons, unspecified thigh: Secondary | ICD-10-CM

## 2011-02-12 DIAGNOSIS — I10 Essential (primary) hypertension: Secondary | ICD-10-CM

## 2011-02-12 DIAGNOSIS — I509 Heart failure, unspecified: Secondary | ICD-10-CM

## 2011-02-12 LAB — GLUCOSE, CAPILLARY
Glucose-Capillary: 128 mg/dL — ABNORMAL HIGH (ref 70–99)
Glucose-Capillary: 231 mg/dL — ABNORMAL HIGH (ref 70–99)

## 2011-02-13 DIAGNOSIS — E1165 Type 2 diabetes mellitus with hyperglycemia: Secondary | ICD-10-CM

## 2011-02-13 DIAGNOSIS — M66259 Spontaneous rupture of extensor tendons, unspecified thigh: Secondary | ICD-10-CM

## 2011-02-13 DIAGNOSIS — I10 Essential (primary) hypertension: Secondary | ICD-10-CM

## 2011-02-13 DIAGNOSIS — I509 Heart failure, unspecified: Secondary | ICD-10-CM

## 2011-02-13 LAB — GLUCOSE, CAPILLARY: Glucose-Capillary: 222 mg/dL — ABNORMAL HIGH (ref 70–99)

## 2011-02-14 DIAGNOSIS — I10 Essential (primary) hypertension: Secondary | ICD-10-CM

## 2011-02-14 DIAGNOSIS — I509 Heart failure, unspecified: Secondary | ICD-10-CM

## 2011-02-14 DIAGNOSIS — M66259 Spontaneous rupture of extensor tendons, unspecified thigh: Secondary | ICD-10-CM

## 2011-02-14 LAB — BASIC METABOLIC PANEL
CO2: 23 mEq/L (ref 19–32)
Calcium: 9.5 mg/dL (ref 8.4–10.5)
Chloride: 97 mEq/L (ref 96–112)
Potassium: 4.1 mEq/L (ref 3.5–5.1)
Sodium: 132 mEq/L — ABNORMAL LOW (ref 135–145)

## 2011-02-14 LAB — CBC
Hemoglobin: 10.2 g/dL — ABNORMAL LOW (ref 13.0–17.0)
Platelets: 403 10*3/uL — ABNORMAL HIGH (ref 150–400)
RBC: 3.34 MIL/uL — ABNORMAL LOW (ref 4.22–5.81)
WBC: 7.5 10*3/uL (ref 4.0–10.5)

## 2011-02-14 LAB — GLUCOSE, CAPILLARY
Glucose-Capillary: 124 mg/dL — ABNORMAL HIGH (ref 70–99)
Glucose-Capillary: 184 mg/dL — ABNORMAL HIGH (ref 70–99)
Glucose-Capillary: 145 mg/dL — ABNORMAL HIGH (ref 70–99)
Glucose-Capillary: 117 mg/dL — ABNORMAL HIGH (ref 70–99)

## 2011-02-15 LAB — GLUCOSE, CAPILLARY: Glucose-Capillary: 199 mg/dL — ABNORMAL HIGH (ref 70–99)

## 2011-02-16 DIAGNOSIS — M66259 Spontaneous rupture of extensor tendons, unspecified thigh: Secondary | ICD-10-CM

## 2011-02-16 DIAGNOSIS — I509 Heart failure, unspecified: Secondary | ICD-10-CM

## 2011-02-16 DIAGNOSIS — I10 Essential (primary) hypertension: Secondary | ICD-10-CM

## 2011-02-16 DIAGNOSIS — E1165 Type 2 diabetes mellitus with hyperglycemia: Secondary | ICD-10-CM

## 2011-02-16 LAB — GLUCOSE, CAPILLARY
Glucose-Capillary: 107 mg/dL — ABNORMAL HIGH (ref 70–99)
Glucose-Capillary: 126 mg/dL — ABNORMAL HIGH (ref 70–99)

## 2011-02-17 LAB — GLUCOSE, CAPILLARY
Glucose-Capillary: 85 mg/dL (ref 70–99)
Glucose-Capillary: 98 mg/dL (ref 70–99)
Glucose-Capillary: 206 mg/dL — ABNORMAL HIGH (ref 70–99)

## 2011-02-18 LAB — GLUCOSE, CAPILLARY: Glucose-Capillary: 219 mg/dL — ABNORMAL HIGH (ref 70–99)

## 2011-02-19 DIAGNOSIS — M66259 Spontaneous rupture of extensor tendons, unspecified thigh: Secondary | ICD-10-CM

## 2011-02-19 DIAGNOSIS — I10 Essential (primary) hypertension: Secondary | ICD-10-CM

## 2011-02-19 DIAGNOSIS — I509 Heart failure, unspecified: Secondary | ICD-10-CM

## 2011-02-19 DIAGNOSIS — E1165 Type 2 diabetes mellitus with hyperglycemia: Secondary | ICD-10-CM

## 2011-02-19 LAB — GLUCOSE, CAPILLARY
Glucose-Capillary: 101 mg/dL — ABNORMAL HIGH (ref 70–99)
Glucose-Capillary: 194 mg/dL — ABNORMAL HIGH (ref 70–99)
Glucose-Capillary: 166 mg/dL — ABNORMAL HIGH (ref 70–99)

## 2011-02-19 LAB — BASIC METABOLIC PANEL
CO2: 29 mEq/L (ref 19–32)
Chloride: 97 mEq/L (ref 96–112)
GFR calc Af Amer: 68 mL/min — ABNORMAL LOW (ref >90)
Sodium: 137 mEq/L (ref 135–145)

## 2011-02-19 LAB — CBC
Platelets: 369 10*3/uL (ref 150–400)
RBC: 3.7 MIL/uL — ABNORMAL LOW (ref 4.22–5.81)
WBC: 5.9 10*3/uL (ref 4.0–10.5)

## 2011-02-20 LAB — GLUCOSE, CAPILLARY: Glucose-Capillary: 210 mg/dL — ABNORMAL HIGH (ref 70–99)

## 2011-02-21 ENCOUNTER — Inpatient Hospital Stay (HOSPITAL_COMMUNITY): Payer: Medicare Other

## 2011-02-21 DIAGNOSIS — M66259 Spontaneous rupture of extensor tendons, unspecified thigh: Secondary | ICD-10-CM

## 2011-02-21 DIAGNOSIS — I509 Heart failure, unspecified: Secondary | ICD-10-CM

## 2011-02-21 DIAGNOSIS — E1165 Type 2 diabetes mellitus with hyperglycemia: Secondary | ICD-10-CM

## 2011-02-21 DIAGNOSIS — I10 Essential (primary) hypertension: Secondary | ICD-10-CM

## 2011-02-21 LAB — GLUCOSE, CAPILLARY

## 2011-02-22 LAB — WOUND CULTURE: Gram Stain: NONE SEEN

## 2011-02-28 ENCOUNTER — Encounter (HOSPITAL_BASED_OUTPATIENT_CLINIC_OR_DEPARTMENT_OTHER): Payer: Medicare Other | Attending: Plastic Surgery

## 2011-02-28 DIAGNOSIS — Z7902 Long term (current) use of antithrombotics/antiplatelets: Secondary | ICD-10-CM | POA: Insufficient documentation

## 2011-02-28 DIAGNOSIS — Y838 Other surgical procedures as the cause of abnormal reaction of the patient, or of later complication, without mention of misadventure at the time of the procedure: Secondary | ICD-10-CM | POA: Insufficient documentation

## 2011-02-28 DIAGNOSIS — Z950 Presence of cardiac pacemaker: Secondary | ICD-10-CM | POA: Insufficient documentation

## 2011-02-28 DIAGNOSIS — T8189XA Other complications of procedures, not elsewhere classified, initial encounter: Secondary | ICD-10-CM | POA: Insufficient documentation

## 2011-02-28 DIAGNOSIS — E119 Type 2 diabetes mellitus without complications: Secondary | ICD-10-CM | POA: Insufficient documentation

## 2011-02-28 DIAGNOSIS — Z79899 Other long term (current) drug therapy: Secondary | ICD-10-CM | POA: Insufficient documentation

## 2011-02-28 DIAGNOSIS — I10 Essential (primary) hypertension: Secondary | ICD-10-CM | POA: Insufficient documentation

## 2011-02-28 DIAGNOSIS — Z96659 Presence of unspecified artificial knee joint: Secondary | ICD-10-CM | POA: Insufficient documentation

## 2011-02-28 DIAGNOSIS — Z7982 Long term (current) use of aspirin: Secondary | ICD-10-CM | POA: Insufficient documentation

## 2011-03-05 DIAGNOSIS — Z0271 Encounter for disability determination: Secondary | ICD-10-CM

## 2011-03-08 ENCOUNTER — Other Ambulatory Visit: Payer: Self-pay | Admitting: Pulmonary Disease

## 2011-03-08 ENCOUNTER — Ambulatory Visit
Admission: RE | Admit: 2011-03-08 | Discharge: 2011-03-08 | Disposition: A | Discharge status: Home / Self Care | Payer: Medicare Other | Source: Ambulatory Visit | Attending: Pulmonary Disease | Admitting: Pulmonary Disease

## 2011-03-08 DIAGNOSIS — R05 Cough: Secondary | ICD-10-CM

## 2011-03-08 NOTE — Discharge Summary (Signed)
[name], [name] NO.:  [id number]  MEDICAL RECORD NO.:  [id number]  LOCATION:                               FACILITY:  [facility]  PHYSICIAN:  Lubertha Basque. Bela Bonaparte, M.D.DATE OF BIRTH:  1918/11/17  DATE OF ADMISSION:  02/05/2011 DATE OF DISCHARGE:                              DISCHARGE SUMMARY   ADMITTING DIAGNOSES: 1. Right quadriceps tendon tear, acute. 2. History of hypertension. 3. Insulin-dependent diabetes. 4. Hyperlipidemia. 5. Complete heart block, status post pacemaker. 6. History of iron deficiency anemia. 7. Chronic hyponatremia. 8. Benign prostatic hypertrophy. 9. Chronic diastolic congestive heart failure.  DISCHARGE DIAGNOSES: 1. Right quadriceps tendon tear, acute. 2. History of hypertension. 3. Insulin-dependent diabetes. 4. Hyperlipidemia. 5. Complete heart block, status post pacemaker. 6. History of iron deficiency anemia. 7. Chronic hyponatremia. 8. Benign prostatic hypertrophy. 9. Chronic diastolic congestive heart failure with acute episode of     congestive heart failure.  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  PERTINENT LABORATORY AND X-RAY FINDINGS:  Last hemoglobin 10.3, hematocrit 30.3, WBCs 11.3, platelets at 277.  Blood chemistries were monitored regularly as he was in the hospital.  Last sodium was 131, which is elevated from previous, potassium 3.8, BUN 21, creatinine 1.10, glucose 196, and this was monitored on a regular basis.  Albumin 2.7, total bilirubin 0.4, alkaline phosphatase 69, ALT 10, AST 22.  Cardiac enzymes were also drawn  and were essentially within normal limits.  Chest x-ray last one done on February 02, 2011, grossly unchanged findings with pulmonary edema and small bilateral effusions and grossly unchanged perihilar and bibasilar opacities possibly atelectasis.  COURSE IN THE HOSPITAL:  He was admitted postoperatively and put on a number of IV medications including pain medicine, IV perioperative antibiotics, IV fluids at 80 mL an hour, and then KVO after p.o. is adequate.  The first day postop, the vital signs were stable, blood pressure 130/66, heart rate of 71, respirations 16, temperature 98.  The dressing and wound were within normal limits.  On the right lower extremity, he was held in a knee brace.  Physical therapy was ordered for out of bed with the brace on, basically brace on at all times, particularly he had a bed with the brace on, touchdown partial weightbearing 20-30% maximum.  The second day postop, he started to develop some difficulty breathing, shortness of breath, had some chest pain.  Cardiac enzymes were on.  EKG was done and it was thought that he was at the beginnings of congestive heart failure, so we had called a consult that was initially called postoperatively on day #1 as well to recheck this patient and it was confirmed that [name] was more than likely of an acute episode of congestive heart failure.  He was moved  to intensive care unit and monitored very closely and diuresed.  Later in the ensuing day to two days, he was put onto a step-down unit with continued progress on appropriate medications, continued to show significant improvement and then later was put on the 2000 unit floor for monitoring and restarting physical therapy.  We had also obtained an inpatient rehab consult and he was felt to be a good candidate for that and would be taken to that floor hopefully on February 05, 2011, for more intensive physical therapy.  During his hospital stay, his  dressing was changed numerous times, the wound was noted to be benign.  No sign of infection, irritation, minimal peripheral edema noted.  Condition on discharge to the rehab unit is improved.  FOLLOWUP:  He will remain on medicines as outlined in his med management sheet of a medical record.  He will continue to be partial weightbearing 20-30% on the right leg knee brace on at most times, may change the dressing on a daily basis.  Continue ice and elevation as well for pain. His medicines once again are outlined in his med management discharge summary.  Any sign of infection, which would be increasing pain, drainage, or erythema to call us, we would be happy to come and see him. The staples will remain in for 2 weeks total and then can be removed, but he will continue to be in a knee immobilizer.  Diet would be diabetic diet and advancing as tolerated.  Continue monitor blood sugars before meals and at bedtime.     [name], P.A.   ______________________________ Lubertha Basque. Jerl Santos, M.D.    MC/MEDQ  D:  02/05/2011  T:  02/06/2011  Job:  [id number]  Electronically Signed by [name] P.A. on 02/20/2011 05:13:00 PM Electronically Signed by Marcene Corning M.D. on 03/08/2011 01:26:07 PM

## 2011-03-13 NOTE — H&P (Signed)
[name], AYER NO.:  [id number]  MEDICAL RECORD NO.:  [id number]  LOCATION:  4007                         FACILITY:  [facility]  PHYSICIAN:  Ranelle Oyster, M.D.DATE OF BIRTH:  06-22-1918  DATE OF ADMISSION:  02/05/2011 DATE OF DISCHARGE:                             HISTORY & PHYSICAL   CHIEF COMPLAINTS:  Right leg, right knee pain.  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  He was admitted on January 30, 2011, after a fall without loss of consciousness.  He sustained a right quadriceps rupture and underwent quad repair on January 30, 2011, by Dr. Jerl Santos.  He is partial weightbearing with a knee immobilizer in place.  He uses a toe up brace for the left footdrop.  He has a known history of SIADH with chronic hyponatremia.  He developed mild postoperative confusion which has been monitored.  He has responded to decreased narcotics.  On postoperative day #2, he developed decreased oxygen saturations and chest x-ray showed fluid overload and he was transferred to telemetry for monitoring and he was diuresed.  The echocardiogram showed an ejection fraction of 55% and grade 1 diastolic dysfunction.  He is maintained on Lasix as prior to his admission with follow chest x-rays showing small bilateral pleural effusions plus or minus pulmonary edema.  O2 sats have improve to 96%.  He remains on aspirin and Plavix as prior to arrival.  He is on subcu heparin for DVT prophylaxis.  Rehab was consulted and has been following along with this patient and felt he could benefit from an inpatient rehab stay.  REVIEW OF SYSTEMS:  Notable for left wrist pain now over the last few days, joint swelling, anxiety, and palpitations.  Some fatigue.  He has been emptying his bladder since Foley catheter  was removed this morning. Full review is in the written H and P.  PAST MEDICAL HISTORY:  Positive for: 1. Diabetes mellitus with neuropathy. 2. Hypertension. 3. Hyperlipidemia. 4. CAD. 5. Permanent pacer. 6. BPH. 7. TURP. 8. CHF. 9. Anxiety. 10.L2-L3 laminectomy in September 2012. 11.Left total knee replacement with postoperative footdrop in 2005. 12.Left rotator cuff tear. 13.Remote tobacco use and negative alcohol use.  FAMILY HISTORY:  Positive for CAD and diabetes.  SOCIAL HISTORY:  The patient is widowed, still works part time as a Development worker, community.  He has a caregiver who can help as needed.  He has a two- level house with bedroom and 2  steps to enter.  ALLERGIES:  STATINS.  HOME MEDICATIONS:  Avodart, Diovan, lorazepam, Timolol, Norvasc, Systane ophthalmic drops, Humalog insulin, Lantus insulin, iron, Lasix, Imdur, aspirin, Plavix, and potassium.  LABORATORY DATA:  Hemoglobin 10.3, white count 11,000, platelets 277. Sodium 131, potassium 3.8, BUN 21, creatinine 1.1.  PHYSICAL EXAMINATION:  VITAL SIGNS:  Blood pressures 144/78, pulse 73, respiratory rate 18, temperature 99. GENERAL:  The patient is pleasant, alert, oriented x3.  He did fatigue a bit during the exam. HEENT:  Pupils are equally round and reactive to  light.  Ear, nose, and throat exam is notable for missing teeth, but otherwise intact dentition. NECK:  Supple without JVD or lymphadenopathy. CHEST:  Notable for some crackles and decreased sounds at the bases, but otherwise fair air movement more superiorly. HEART:  Regular rate and rhythm without murmurs, rubs, or gallops. ABDOMEN:  Soft, nontender.  Bowel sounds are positive. SKIN:  Notable for right knee incision which is clean and intact with staples.  The area was appropriately tender with minimal swelling there noted. EXTREMITIES:  He did have some mild tenderness over the right Achilles tendon with range of motion and Achilles tendon was a bit tight  with passive movement.  The left wrist was examined and was painful along the ulnar aspect and particularly with varus stress placed on the wrist joint.  He did have some pain with passive extension of the wrist as well and at ulnar plane. NEUROLOGIC:  Cranial nerves II through XII grossly intact.  Reflexes 1+. Sensation generally intact in all 4 limbs, pinprick and light touch. Judgment, orientation, memory, and mood were all age appropriate. Strength was 5/5 right upper extremity.  Left upper extremity was 3-4/5 with inhibition mostly at the wrist today due to pain.  Left lower extremity is 3-4/5 proximally to 4/5 distally except for his footdrop where he had trace to 1/5 ankle dorsiflexion.  Right lower extremity is 3-4/5 at the ankle.  I did not test the knee and hip.  He was wearing his knee immobilizer.  Judgment, orientation, memory, and mood seemed to be all appropriate when he took extra time to answer questions.  POSTADMISSION PHYSICIAN EVALUATION: 1. Functional deficits secondary to right quad tendon rupture status     post quad tendon repair on January 30, 2011. 2. The patient is admitted to receive collaborative interdisciplinary     care between the physiatrist, rehab nursing staff, and therapy     team. 3. The patient's level of medical complexity and substantial therapy     needs in context of that medical necessity cannot be provided at a     lesser intensity of care. 4. The patient has experienced substantial functional loss from his     baseline.  Upon functional assessment, currently, the patient is     total assist bed mobility 25%, total assist transfers 25%.  He     stood 10-15 seconds with max assist.  He is max assist upper body     care, total assist lower body, ADLs.  The patient was walking with     a cane and walker prior to the arrival.  Judging by the patient's     diagnosis, physical exam, and functional history, he has potential     for functional  progress which will result in measurable gains while     in inpatient rehab.  These gains will be of substantial and     practical use upon discharge to home in facilitating mobility and     self-care. 5. The physiatrist will provide 24-hour management of the medical     needs as well as oversight of the therapy plans/treatment and     provide guidance as appropriate regarding interaction of the two.     Medical problem list and plan are below. 6. The 24-hour rehab nursing team will assist in the management of the     patient's skin care needs as well as bowel and bladder function,     safety awareness, integration of therapy concepts and  techniques. 7. PT will assess and treat for lower extremity strength, range of     motion, functional mobility, gait, adherence to weightbearing, and     knee range of motion precautions.  The patient family education     with goals supervision-to-min assist. 8. OT will assess and treat for upper extremity use, ADLs, adaptive     equipment, functional mobility, safety, family and patient     education with goals supervision-to-min/mod assist. 9. Case management and social worker will assess and treat for     psychosocial issues and discharge planning. 10.Team conferences will be held weekly to assess progress towards     goals and to determine barriers at discharge. 11.The patient demonstrated sufficient medical stability, exercise     capacity to tolerate at least 3 hours of therapy per day at least 5     days per week. 12.Estimated length of stay is 2-3 weeks.  Prognosis is good.  The     patient seems fairly motivated and family is involved.  MEDICAL PROBLEM LIST AND PLAN: 1. Deep venous thrombosis prophylaxis with subcu heparin.  Need to     look at perhaps advancing him to subcu Lovenox as I am not sure     this is adequate prophylaxis given his diagnoses. 2. Pain management with p.r.n. Vicodin.  His pain is ranging over 2-     8/10 depending  on activity levels.  Need to assess the need for     scheduled medication.  Consider schedule Tylenol before therapies     each morning. 3. Mood.  Team will provide emotional support.  Ativan on board for     anxiety maintenance. 4. Congestive heart failure:  Strict I's and O's.  Adjust Lasix as     needed.  Check regular weights and adjust regimen accordingly. 5. Postoperative anemia:  Iron supplementation.  Check CBC on admit     labs. 6. Blood pressure and heart rate control:  Norvasc, atenolol, Imdur,     Benicar.  Blood pressure normotensive today.  Heart rates well     controlled. 7. History of coronary artery disease:  Aspirin daily.  Follow up with     Cardiology as needed.  Observe for tolerance of increased activity     with therapy given his needs for increased energy expense or for     transfers, etc. 8. Insulin-requiring diabetes:  Hemoglobin A1c is elevated.  He is on     Lantus insulin at night.  Cover with sliding scale insulin during     the day and at night and adjust baseline regimen as needed given     intake and activity. 9. History of left footdrop. 10.History of recent L2 laminectomy. 11.Benign prostatic hypertrophy and transurethral resection of the     prostate:  Foley was discontinued today.  He is on Avodart.  He has     not voided since Foley catheter was removed.  Check UA as well as     volumes. 12.Constipation:  Add MiraLax p.r.n. as the patient used prior to     arrival, increase his Senokot-S to maintain regular movements.  He     probably will need an aggressive initial regimen as he has not had     substantial movements until he has been in the hospital except for     a few results after an enema. 13.History of syndrome of inappropriate antidiuretic hormone     secretion.  Follow sodium with baseline  in the 127-130 range. 14.Left wrist pain:  We will check x-rays to rule out occult fracture.     Add p.r.n. ice and Voltaren gel.     Ranelle Oyster, M.D.     ZTS/MEDQ  D:  02/05/2011  T:  02/05/2011  Job:  [id number]  cc:   Margaretmary Bayley, M.D.  Electronically Signed by Faith Rogue M.D. on 03/13/2011 09:21:27 AM

## 2011-03-13 NOTE — Discharge Summary (Signed)
[name], [name]  MEDICAL RECORD NO.:  [id number]  LOCATION:  4007                         FACILITY:  [facility]  PHYSICIAN:  Ranelle Oyster, M.D.DATE OF BIRTH:  10-13-1918  DATE OF ADMISSION:  02/05/2011 DATE OF DISCHARGE:  02/21/2011                              DISCHARGE SUMMARY   DISCHARGE DIAGNOSES: 1. Right quadriceps tendon rupture, status post quadriceps tendon     repair on January 30, 2011. 2. Subcutaneous heparin for deep vein thrombosis prophylaxis. 3. Pain management. 4. Anxiety. 5. Congestive heart failure. 6. Postoperative anemia. 7. Hypertension. 8. Coronary artery disease, permanent pacemaker. 9. Insulin-dependent diabetes mellitus. 10.Left total knee replacement in 2005 with a chronic footdrop. 11.Lumbar L2-3 laminectomy in September 2012. 12.Benign prostatic hypertrophy. 13.Constipation-resolved. 14.Syndrome of inappropriate antidiuretic hormone. 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  Sustained a right quadriceps rupture.  Underwent quadriceps repair on January 30, 2011 per Dr. Jerl Santos.  Partial weightbearing with a knee immobilizer placed. Uses a toe-up brace for chronic left footdrop since knee replacement in 2005.  Noted history of SIADH with chronic hyponatremia, 127-129, and monitored.  The patient with some mild postoperative confusion with narcotics limited.  On postoperative day 2, decrease in oxygen saturations.  Rapid Response Team was contacted.  Chest x-ray with suspect congestive heart failure exacerbation.  He was transferred to telemetry.  Cardiac enzymes negative.  Echocardiogram with ejection fraction of 55% with a grade 1 diastolic dysfunction and no change from prior study.  He remained on Lasix therapy.  He was on subcutaneous heparin  for deep vein thrombosis prophylaxis.  He was total assist for mobility.  He was admitted for comprehensive rehab program.  PAST MEDICAL HISTORY:  See discharge diagnoses.  Remote smoker.  No alcohol.  ALLERGIES:  STATINS.  SOCIAL HISTORY:  Widowed.  He still works part-time as a Development worker, community.  He has a caregiver as needed.  Two-level home with bedroom downstairs, 2 steps to entry.  FUNCTIONAL HISTORY PRIOR TO ADMISSION: He used a cane and a walker at times.  FUNCTIONAL STATUS UPON ADMISSION TO REHAB SERVICES:  He was total assist bed mobility, total assist transfers.  He stood 10-15 seconds with a rolling walker and maximum assistance, max assist upper body, total assist lower body activities of daily living.  MEDICATIONS PRIOR TO ADMISSION: 1. Avodart 0.5 mg daily. 2. Diovan 160 mg twice daily 3. Lorazepam 0.5 mg twice daily. 4. Eye drops. 5. Norvasc 10 mg daily. 6. Humalog 4-6 units 3 times daily. 7. Lantus insulin 15 units at bedtime. 8. Iron 325 mg daily. 9. Lasix 80 mg daily. 10.Imdur 30 mg daily. 11.Aspirin 81 mg 2 tablets twice daily. 12.Plavix 75 mg daily. 13.Potassium 20 mEq daily.  PHYSICAL EXAMINATION:  VITAL SIGNS:  Blood pressure 144/78, pulse 73, temperature 99, respirations 18. GENERAL:  This was an alert male, oriented x3.  Deep tendon reflexes 2+. Calves remained cool without any erythema.  Noted left footdrop.  Knee immobilizer in place to right lower extremity.  Surgical site  healing nicely. LUNGS:  Clear to auscultation. CARDIAC:  Regular rate and rhythm. ABDOMEN:  Soft, nontender.  Good bowel sounds.  REHABILITATION HOSPITAL COURSE:  The patient was admitted to Inpatient Rehab Services with therapies initiated on a 3-hour daily basis consisting of physical therapy, occupational therapy, and rehabilitation nursing.  The following issues were addressed during the patient's rehabilitation stay.  Pertaining to [name]'s right quadriceps  tendon rupture, he had undergone repair on January 30, 2011 per Dr. Jerl Santos. He was partial weightbearing with a knee immobilizer.  Neurovascular sensation intact.  He did develop a right patella wound irritation secondary to knee immobilizer.  Dr. Jerl Santos did follow up.  Knee immobilizer was discontinued by Orthopedic Services on February 19, 2011. It was advised to use a foam silicone dressing to the right knee.  He was placed on empiric Keflex on February 19, 2011.  Wound cultures were obtained with no organisms seen.  He remained afebrile.  He was using subcutaneous heparin for deep vein thrombosis prophylaxis throughout his rehab course, Vicodin for pain management and monitored closely for any altered mental status.  He did have a history of anxiety, using Ativan as advised.  Postoperative anemia, stable.  Maintained on iron supplement with latest hemoglobin of 11.1, hematocrit 33.7.  He had some variables in his blood sugars, but overall controlled.  He continued on his Lantus insulin as advised.  He did have a hemoglobin A1c of 8.5 with full diabetic teaching completed.  He had a long history of coronary artery disease with permanent pacemaker.  He received followup as needed per Cardiology Service, Dr. Verdis Prime.  He remained on Norvasc, atenolol, Lasix, Imdur.  He exhibited no signs of fluid overload.  A chest x-ray was obtained on February 21, 2011 due to some mild cough, showed no signs of congestive heart failure.  Noted improved aeration with only minimal residual bibasilar opacities.  Oxygen saturations remained greater than 90% on room air.  He did have a history of left total knee replacement in 2005 with a chronic footdrop in which he did wear a brace for this.  Recent lumbar L2-3 laminectomy, he was wearing a back corset as needed for comfort.  He did have a history of benign prostatic hypertrophy.  He continued on Avodart.  He had some initial difficulty with urinary  retention.  This improved with his overall mobility.  A urine study showed no growth.  He did receive followup as needed from Dr. Brunilda Payor of Neurology Services.  Noted documentation of history of syndrome of inappropriate antidiuretic hormone with sodium levels 127-129 and monitored latest sodium level 137 on February 19, 2011. The patient received weekly collaborative interdisciplinary team conferences to discuss estimated length of stay, family teaching, and any barriers to discharge.  He was minimal assist for ambulation as well as stairs, modified independent wheelchair mobility, minimal assist overall for activities of daily living, continent of bowel and bladder. He received full family teaching with his caregiver.  Plan is to be discharged to home with home health therapies as well as a home health nurse.  LABORATORY DATA:  Latest labs showed a sodium 137, potassium 3.7, BUN 22, creatinine 1.07.  Hemoglobin 11.1, hematocrit 33, platelets 369,000. Latest urine culture, no growth.  Latest wound culture showed no organisms seen.  DISCHARGE MEDICATIONS AT THE TIME OF DICTATION: 1. Norvasc 10 mg daily. 2. Aspirin 162 mg daily. 3. Tenormin 25 mg daily. 4. Keflex 500 mg 3 times daily x10-day course. 5. Plavix  75 mg daily. 6. Avodart 0.5 mg daily. 7. Lasix 80 mg daily. 8. Lantus insulin 24 units daily. 9. Imdur 30 mg daily. 10.Ativan 0.25 mg twice daily. 11.Multivitamin daily. 12.MiraLax 17 g twice daily with 8 ounce of water, hold for loose     stool. 13.Potassium chloride 20 mEq daily. 14.Timoptic ophthalmic solution 1 or 2 drops both eyes daily. 15.Diovan 160 mg daily. 16.Vicodin 5/325 one or two tablets every 4 hours as needed for pain,     dispense of 90 tablets.  DIET:  Diabetic diet.  SPECIAL INSTRUCTIONS:  Partial weightbearing to the right lower extremity.  Knee immobilizer since discontinued.  The patient should follow up with Dr. Jerl Santos on Monday, February 26, 2011.   A home health nurse had been arranged for wound care of right knee with a foam silicone dressing change every 5 days as needed.  The patient would follow up with Dr. Margaretmary Bayley, Medical Management; Dr. Verdis Prime, Cardiology Service as needed; Dr. Faith Rogue, the Outpatient Rehab Service office as needed.  Home health therapies had been arranged.     Mariam Dollar, P.A.   ______________________________ Ranelle Oyster, M.D.    DA/MEDQ  D:  02/21/2011  T:  02/21/2011  Job:  [id number]  cc:   Ranelle Oyster, M.D. Margaretmary Bayley, M.D. Lyn Records, M.D. Lubertha Basque Jerl Santos, M.D. Lindaann Slough, M.D.  Electronically Signed by Mariam Dollar P.A. on 02/21/2011 03:18:55 PM Electronically Signed by Faith Rogue M.D. on 03/13/2011 09:21:02 AM

## 2011-03-16 ENCOUNTER — Encounter (HOSPITAL_BASED_OUTPATIENT_CLINIC_OR_DEPARTMENT_OTHER): Payer: Medicare Other | Attending: Plastic Surgery

## 2011-03-16 DIAGNOSIS — E119 Type 2 diabetes mellitus without complications: Secondary | ICD-10-CM | POA: Insufficient documentation

## 2011-03-16 DIAGNOSIS — Z96659 Presence of unspecified artificial knee joint: Secondary | ICD-10-CM | POA: Insufficient documentation

## 2011-03-16 DIAGNOSIS — Z79899 Other long term (current) drug therapy: Secondary | ICD-10-CM | POA: Insufficient documentation

## 2011-03-16 DIAGNOSIS — T8189XA Other complications of procedures, not elsewhere classified, initial encounter: Secondary | ICD-10-CM | POA: Insufficient documentation

## 2011-03-16 DIAGNOSIS — Y838 Other surgical procedures as the cause of abnormal reaction of the patient, or of later complication, without mention of misadventure at the time of the procedure: Secondary | ICD-10-CM | POA: Insufficient documentation

## 2011-03-16 DIAGNOSIS — Z7982 Long term (current) use of aspirin: Secondary | ICD-10-CM | POA: Insufficient documentation

## 2011-03-16 DIAGNOSIS — Z950 Presence of cardiac pacemaker: Secondary | ICD-10-CM | POA: Insufficient documentation

## 2011-03-16 DIAGNOSIS — I10 Essential (primary) hypertension: Secondary | ICD-10-CM | POA: Insufficient documentation

## 2011-03-16 DIAGNOSIS — Z7902 Long term (current) use of antithrombotics/antiplatelets: Secondary | ICD-10-CM | POA: Insufficient documentation

## 2011-04-02 NOTE — H&P (Signed)
  NAMENOA, CONSTANTE NO.:  [id number]  MEDICAL RECORD NO.:  [id number]  LOCATION:  FOOT                         FACILITY:  [facility]  PHYSICIAN:  Ardath Sax, M.D.     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  He is 77 years of age and unfortunately, he had a quadriceps tendon on the right side rupture and then he had it surgically repaired.  He has also had a left knee replacement and has a pacemaker and has had a TUR.  He has had cataracts.  He is a diabetic.  He is on Plavix and K-Dur and timolol ophthalmic drops.  He has mild hypertension and his medicines include atenolol.  He is on Keflex for the right knee.  He also is on aspirin, Avodart, Diovan, ferrous sulfate, Lasix, isosorbide, and MiraLax.  He is sent here by his orthopedic physician for evaluation and because he is diabetic and because he really has quite a wound from his operative procedure over his patella, I had to debride an area about 3 cm in diameter of necrotic skin with sharp dissection, and it went down to the subcutaneous tissue.  Hopefully, his patellar tendon repair is still intact.  He is able to walk with a walker.  Because he is diabetic and because this is at least a Wagner 3 ulceration, we are going to apply for HBO treatments.  He is already on Keflex and we gave him a prescription for Santyl to use every day and we will see him back here next week, so he is on antibiotics for the wound.  He is on Santyl for the wound and were applying for treatment with hyperbaric oxygen.     Ardath Sax, M.D.     PP/MEDQ  D:  02/28/2011  T:  02/28/2011  Job:  [id number]  Electronically Signed by Ardath Sax  on 04/02/2011 04:54:09 PM

## 2011-04-02 NOTE — H&P (Signed)
  NAMEAIVAN, [name] NO.:  [id number]  MEDICAL RECORD NO.:  [id number]  LOCATION:  FOOT                         FACILITY:  [facility]  PHYSICIAN:  Ardath Sax, M.D.     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  He had an accident some time ago and tore his right patellar tendon and had to have it surgically repaired. Unfortunately, the wound broke down and he has an open wound about 3 x 3 cm that goes right down to the surgical repair of the patellar tendon. We have been debriding it here in the [facility] and treating him with Santyl dressings daily.  He is a diabetic and he is on several medicines including Diovan, Lasix, and isosorbide.  He is a candidate we feel for HBO to help this wound heal as we feel that right now he has a gold an opportunity to keep debriding this wound and treating it and not have a tendon repair breakdown and we feel that the HBO will help.  We also feel that a wound VAC would be of great benefit, so we are going to apply for wound VAC and for HBO because it is a surgical wound that is open and has some ischemic tissue and the fact that he is a diabetic, I think both of these would be of a great benefit to this gentleman.     Ardath Sax, M.D.     PP/MEDQ  D:  03/09/2011  T:  03/09/2011  Job:  [id number]  Electronically Signed by Ardath Sax  on 04/02/2011 03:23:36 PM

## 2011-04-20 ENCOUNTER — Encounter (HOSPITAL_BASED_OUTPATIENT_CLINIC_OR_DEPARTMENT_OTHER): Payer: Medicare Other | Attending: General Surgery

## 2011-04-20 DIAGNOSIS — T8189XA Other complications of procedures, not elsewhere classified, initial encounter: Secondary | ICD-10-CM | POA: Insufficient documentation

## 2011-04-20 DIAGNOSIS — Y838 Other surgical procedures as the cause of abnormal reaction of the patient, or of later complication, without mention of misadventure at the time of the procedure: Secondary | ICD-10-CM | POA: Insufficient documentation

## 2011-05-18 ENCOUNTER — Encounter (HOSPITAL_BASED_OUTPATIENT_CLINIC_OR_DEPARTMENT_OTHER): Payer: Medicare Other | Attending: General Surgery

## 2011-05-18 DIAGNOSIS — T8189XA Other complications of procedures, not elsewhere classified, initial encounter: Secondary | ICD-10-CM | POA: Insufficient documentation

## 2011-05-18 DIAGNOSIS — Y838 Other surgical procedures as the cause of abnormal reaction of the patient, or of later complication, without mention of misadventure at the time of the procedure: Secondary | ICD-10-CM | POA: Insufficient documentation

## 2011-05-21 ENCOUNTER — Ambulatory Visit: Payer: Medicare Other | Attending: Neurology | Admitting: Physical Therapy

## 2011-05-21 DIAGNOSIS — H8110 Benign paroxysmal vertigo, unspecified ear: Secondary | ICD-10-CM | POA: Insufficient documentation

## 2011-05-21 DIAGNOSIS — R269 Unspecified abnormalities of gait and mobility: Secondary | ICD-10-CM | POA: Insufficient documentation

## 2011-05-26 ENCOUNTER — Inpatient Hospital Stay (HOSPITAL_COMMUNITY)
Admission: EM | Admit: 2011-05-26 | Discharge: 2011-05-28 | DRG: 152 | Disposition: A | Discharge status: Home / Self Care | Payer: Medicare Other | Attending: Internal Medicine | Admitting: Internal Medicine

## 2011-05-26 ENCOUNTER — Encounter (HOSPITAL_COMMUNITY): Payer: Self-pay | Admitting: *Deleted

## 2011-05-26 ENCOUNTER — Emergency Department (HOSPITAL_COMMUNITY): Payer: Medicare Other

## 2011-05-26 DIAGNOSIS — Z950 Presence of cardiac pacemaker: Secondary | ICD-10-CM

## 2011-05-26 DIAGNOSIS — E860 Dehydration: Secondary | ICD-10-CM | POA: Diagnosis present

## 2011-05-26 DIAGNOSIS — E119 Type 2 diabetes mellitus without complications: Secondary | ICD-10-CM | POA: Diagnosis present

## 2011-05-26 DIAGNOSIS — E785 Hyperlipidemia, unspecified: Secondary | ICD-10-CM | POA: Diagnosis present

## 2011-05-26 DIAGNOSIS — I5020 Unspecified systolic (congestive) heart failure: Secondary | ICD-10-CM | POA: Diagnosis present

## 2011-05-26 DIAGNOSIS — J069 Acute upper respiratory infection, unspecified: Principal | ICD-10-CM | POA: Diagnosis present

## 2011-05-26 DIAGNOSIS — J189 Pneumonia, unspecified organism: Secondary | ICD-10-CM | POA: Diagnosis present

## 2011-05-26 DIAGNOSIS — I509 Heart failure, unspecified: Secondary | ICD-10-CM | POA: Diagnosis present

## 2011-05-26 DIAGNOSIS — I10 Essential (primary) hypertension: Secondary | ICD-10-CM | POA: Diagnosis present

## 2011-05-26 HISTORY — DX: Essential (primary) hypertension: I10

## 2011-05-26 HISTORY — DX: Hyperlipidemia, unspecified: E78.5

## 2011-05-26 LAB — GLUCOSE, CAPILLARY
Glucose-Capillary: 202 mg/dL — ABNORMAL HIGH (ref 70–99)
Glucose-Capillary: 236 mg/dL — ABNORMAL HIGH (ref 70–99)
Glucose-Capillary: 275 mg/dL — ABNORMAL HIGH (ref 70–99)

## 2011-05-26 LAB — CBC
HCT: 34.2 % — ABNORMAL LOW (ref 39.0–52.0)
Hemoglobin: 11.3 g/dL — ABNORMAL LOW (ref 13.0–17.0)
Hemoglobin: 9.4 g/dL — ABNORMAL LOW (ref 13.0–17.0)
MCH: 31.3 pg (ref 26.0–34.0)
MCHC: 33 g/dL (ref 30.0–36.0)
MCV: 94.2 fL (ref 78.0–100.0)
RDW: 13.6 % (ref 11.5–15.5)
WBC: 10.1 10*3/uL (ref 4.0–10.5)

## 2011-05-26 LAB — CARDIAC PANEL(CRET KIN+CKTOT+MB+TROPI)
Relative Index: 1.4 (ref 0.0–2.5)
Relative Index: 1 (ref 0.0–2.5)
Total CK: 179 U/L (ref 7–232)
Troponin I: <0.3 ng/mL (ref <0.30)

## 2011-05-26 LAB — CULTURE, BLOOD (ROUTINE X 2)
Culture  Setup Time: 201301121151
Culture  Setup Time: 201301121342
Culture: NO GROWTH

## 2011-05-26 LAB — POCT I-STAT, CHEM 8
HCT: 36 % — ABNORMAL LOW (ref 39.0–52.0)
Hemoglobin: 12.2 g/dL — ABNORMAL LOW (ref 13.0–17.0)
Potassium: 4.2 mEq/L (ref 3.5–5.1)
Sodium: 139 mEq/L (ref 135–145)

## 2011-05-26 LAB — DIFFERENTIAL
Eosinophils Relative: 1 % (ref 0–5)
Lymphocytes Relative: 14 % (ref 12–46)
Monocytes Absolute: 0.9 10*3/uL (ref 0.1–1.0)
Monocytes Relative: 9 % (ref 3–12)
Neutro Abs: 7.7 10*3/uL (ref 1.7–7.7)

## 2011-05-26 LAB — INFLUENZA PANEL BY PCR (TYPE A & B)
Influenza A By PCR: NEGATIVE
Influenza B By PCR: NEGATIVE

## 2011-05-26 LAB — CREATININE, SERUM: Creatinine, Ser: 1.14 mg/dL (ref 0.50–1.35)

## 2011-05-26 MED ORDER — OSELTAMIVIR PHOSPHATE 75 MG PO CAPS
75.0000 mg | ORAL_CAPSULE | Freq: Two times a day (BID) | ORAL | Status: DC
  Administered 2011-05-26 – 2011-05-28 (×3): 75 mg via ORAL
  Filled 2011-05-26 (×6): qty 1

## 2011-05-26 MED ORDER — HYDROCODONE-ACETAMINOPHEN 5-325 MG PO TABS
1.0000 | ORAL_TABLET | ORAL | Status: DC | PRN
  Administered 2011-05-26 – 2011-05-27 (×2): 1 via ORAL
  Filled 2011-05-26 (×2): qty 1

## 2011-05-26 MED ORDER — IPRATROPIUM BROMIDE 0.02 % IN SOLN
0.5000 mg | RESPIRATORY_TRACT | Status: DC
  Administered 2011-05-26 (×3): 0.5 mg via RESPIRATORY_TRACT
  Filled 2011-05-26 (×7): qty 2.5

## 2011-05-26 MED ORDER — FERROUS SULFATE 325 (65 FE) MG PO TABS
325.0000 mg | ORAL_TABLET | Freq: Every day | ORAL | Status: DC
  Administered 2011-05-26 – 2011-05-28 (×3): 325 mg via ORAL
  Filled 2011-05-26 (×3): qty 1

## 2011-05-26 MED ORDER — POTASSIUM CHLORIDE CRYS ER 20 MEQ PO TBCR
20.0000 meq | EXTENDED_RELEASE_TABLET | Freq: Every day | ORAL | Status: DC
  Administered 2011-05-26 – 2011-05-28 (×3): 20 meq via ORAL
  Filled 2011-05-26 (×3): qty 1

## 2011-05-26 MED ORDER — INSULIN ASPART 100 UNIT/ML ~~LOC~~ SOLN
0.0000 [IU] | Freq: Three times a day (TID) | SUBCUTANEOUS | Status: DC
  Administered 2011-05-26 (×2): 5 [IU] via SUBCUTANEOUS
  Administered 2011-05-27: 3 [IU] via SUBCUTANEOUS
  Administered 2011-05-27: 8 [IU] via SUBCUTANEOUS
  Administered 2011-05-27: 5 [IU] via SUBCUTANEOUS
  Administered 2011-05-28: 11 [IU] via SUBCUTANEOUS
  Administered 2011-05-28: 5 [IU] via SUBCUTANEOUS
  Filled 2011-05-26: qty 0.15

## 2011-05-26 MED ORDER — PANTOPRAZOLE SODIUM 40 MG IV SOLR
40.0000 mg | INTRAVENOUS | Status: DC
  Administered 2011-05-26 – 2011-05-28 (×3): 40 mg via INTRAVENOUS
  Filled 2011-05-26 (×3): qty 40

## 2011-05-26 MED ORDER — IBUPROFEN 800 MG PO TABS
400.0000 mg | ORAL_TABLET | ORAL | Status: DC | PRN
  Administered 2011-05-26: 400 mg via ORAL
  Filled 2011-05-26: qty 2
  Filled 2011-05-26: qty 1

## 2011-05-26 MED ORDER — SODIUM CHLORIDE 0.9 % IV SOLN
INTRAVENOUS | Status: DC
  Administered 2011-05-26 – 2011-05-27 (×4): via INTRAVENOUS

## 2011-05-26 MED ORDER — DEXTROSE 5 % IV SOLN
500.0000 mg | INTRAVENOUS | Status: DC
  Administered 2011-05-26 – 2011-05-28 (×3): 500 mg via INTRAVENOUS
  Filled 2011-05-26 (×3): qty 500

## 2011-05-26 MED ORDER — ALBUTEROL SULFATE (5 MG/ML) 0.5% IN NEBU
2.5000 mg | INHALATION_SOLUTION | Freq: Once | RESPIRATORY_TRACT | Status: AC
  Administered 2011-05-26: 2.5 mg via RESPIRATORY_TRACT
  Filled 2011-05-26: qty 0.5

## 2011-05-26 MED ORDER — DEXTROSE 5 % IV SOLN
1.0000 g | Freq: Once | INTRAVENOUS | Status: AC
  Administered 2011-05-26: 1 g via INTRAVENOUS
  Filled 2011-05-26: qty 10

## 2011-05-26 MED ORDER — LORAZEPAM 0.5 MG PO TABS
0.5000 mg | ORAL_TABLET | Freq: Three times a day (TID) | ORAL | Status: DC
  Administered 2011-05-26 – 2011-05-27 (×2): 0.5 mg via ORAL
  Filled 2011-05-26 (×3): qty 1

## 2011-05-26 MED ORDER — FUROSEMIDE 10 MG/ML IJ SOLN
40.0000 mg | Freq: Every day | INTRAMUSCULAR | Status: DC
  Administered 2011-05-26 – 2011-05-27 (×2): 40 mg via INTRAVENOUS
  Filled 2011-05-26 (×2): qty 4

## 2011-05-26 MED ORDER — DEXTROSE 5 % IV SOLN
1.0000 g | INTRAVENOUS | Status: DC
  Administered 2011-05-27: 09:00:00 via INTRAVENOUS
  Administered 2011-05-28: 1 g via INTRAVENOUS
  Filled 2011-05-26 (×4): qty 10

## 2011-05-26 MED ORDER — ALBUTEROL SULFATE (5 MG/ML) 0.5% IN NEBU
2.5000 mg | INHALATION_SOLUTION | RESPIRATORY_TRACT | Status: DC
  Administered 2011-05-26 (×3): 2.5 mg via RESPIRATORY_TRACT
  Filled 2011-05-26 (×7): qty 0.5

## 2011-05-26 MED ORDER — ENOXAPARIN SODIUM 40 MG/0.4ML ~~LOC~~ SOLN
40.0000 mg | SUBCUTANEOUS | Status: DC
  Administered 2011-05-26 – 2011-05-28 (×3): 40 mg via SUBCUTANEOUS
  Filled 2011-05-26 (×3): qty 0.4

## 2011-05-26 MED ORDER — IPRATROPIUM BROMIDE 0.02 % IN SOLN
0.5000 mg | Freq: Once | RESPIRATORY_TRACT | Status: AC
  Administered 2011-05-26: 0.5 mg via RESPIRATORY_TRACT
  Filled 2011-05-26: qty 2.5

## 2011-05-26 MED ORDER — ENOXAPARIN SODIUM 40 MG/0.4ML ~~LOC~~ SOLN: 40.0000 mg | SUBCUTANEOUS | Status: DC

## 2011-05-26 MED ORDER — ASPIRIN EC 81 MG PO TBEC
81.0000 mg | DELAYED_RELEASE_TABLET | Freq: Every day | ORAL | Status: DC
  Administered 2011-05-26 – 2011-05-28 (×3): 81 mg via ORAL
  Filled 2011-05-26 (×3): qty 1

## 2011-05-26 MED ORDER — OLMESARTAN MEDOXOMIL 5 MG PO TABS
10.0000 mg | ORAL_TABLET | Freq: Every day | ORAL | Status: DC
  Administered 2011-05-26 – 2011-05-28 (×3): 10 mg via ORAL
  Filled 2011-05-26 (×3): qty 1
  Filled 2011-05-26: qty 2

## 2011-05-26 MED ORDER — AMLODIPINE BESYLATE 10 MG PO TABS
10.0000 mg | ORAL_TABLET | Freq: Every day | ORAL | Status: DC
  Administered 2011-05-26 – 2011-05-28 (×3): 10 mg via ORAL
  Filled 2011-05-26 (×3): qty 1

## 2011-05-26 MED ORDER — DEXTROSE 5 % IV SOLN
500.0000 mg | Freq: Once | INTRAVENOUS | Status: DC
  Filled 2011-05-26 (×3): qty 500

## 2011-05-26 MED ORDER — ISOSORBIDE MONONITRATE ER 30 MG PO TB24
30.0000 mg | ORAL_TABLET | Freq: Every day | ORAL | Status: DC
  Administered 2011-05-26 – 2011-05-28 (×3): 30 mg via ORAL
  Filled 2011-05-26 (×3): qty 1

## 2011-05-26 MED ORDER — IPRATROPIUM BROMIDE 0.02 % IN SOLN
RESPIRATORY_TRACT | Status: AC
  Filled 2011-05-26: qty 2.5

## 2011-05-26 NOTE — ED Notes (Signed)
DR called to alert dr that pt has diabetes and ask about treatment plan. Dr Beverly Gust gave verbal order for moderate sliding scale coverage.

## 2011-05-26 NOTE — ED Notes (Signed)
md at bedside going over plan of treatment with pt

## 2011-05-26 NOTE — ED Notes (Signed)
Pt c/o cough and shortness of breath x 1 day.

## 2011-05-26 NOTE — ED Notes (Signed)
Report called to Marylene Land, RN on 4w.  Pt aware of transfer to room 1431 after lunch and receiving insulin. Pharmacy paged for insulin pen ASAP.

## 2011-05-26 NOTE — H&P (Signed)
PCP:  [name], MD, MD Chief Complaint:  Cough of 1 days duration Fever started today.  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 He denied any associated chest pain. He denied any shortness of breath. He denied any palpitation. He denied any nausea vomiting. However, this a.m. patient developed a fever. No history of chills or Rigors. Patients cough was said to have persisted and subsequently brought to the emergency room to be evaluated. In the ED, the patient was seen by me, he said he was mildly short of breath, still coughing, and febrile. After evaluation, patient was advised to be admitted for further workup and stabilization.    Review of Systems:  The patient denies anorexia, fever+, weight loss,, vision loss, decreased hearing, hoarseness, chest pain, syncope, dyspnea on exertion, peripheral edema+, balance deficits,cough+ but no hemoptysis, abdominal pain, melena, hematochezia, severe indigestion/heartburn, hematuria, incontinence, genital sores, muscle weakness, suspicious skin lesions, transient blindness, difficulty walking, depression, unusual weight change, abnormal bleeding, enlarged lymph nodes, angioedema, and breast masses.  Past Medical History:  Past Medical History  Diagnosis Date  . CHF (congestive heart failure)   . Diabetes mellitus   . Hypertension   . Hyperlipidemia Congenital bradycardia.      Past Surgical History  Procedure Date  . Lumbar laminectomy Congenita bradycardia status post pacemaker placement.  Left knee replacement.  Right leg tendon rupture status post repair.      Medications:  Prior to Admission medications   Medication Sig Start Date End  Date Taking? Authorizing Provider  amLODipine (NORVASC) 10 MG tablet Take 10 mg by mouth daily.   Yes Historical Provider, MD  aspirin 81 MG tablet Take 81 mg by mouth daily.    Yes Historical Provider, MD  ferrous sulfate 325 (65 FE) MG tablet Take 325 mg by mouth daily with breakfast.   Yes Historical Provider, MD  furosemide (LASIX) 80 MG tablet Take 80 mg by mouth daily.    Yes Historical Provider, MD  HYDROcodone-acetaminophen (LORTAB) 7.5-500 MG per tablet Take 1 tablet by mouth every 6 (six) hours as needed.   Yes Historical Provider, MD  isosorbide mononitrate (IMDUR) 30 MG 24 hr tablet Take 30 mg by mouth daily.   Yes Historical Provider, MD  LORazepam (ATIVAN) 0.5 MG tablet Take 0.5 mg by mouth every 8 (eight) hours.   Yes Historical Provider, MD  potassium chloride SA (K-DUR,KLOR-CON) 20 MEQ tablet Take 20 mEq by mouth daily.   Yes Historical Provider, MD  valsartan (DIOVAN) 160 MG tablet Take 160 mg by mouth 2 (two) times daily.    Yes Historical Provider, MD    Allergies:  No Known Allergies  Social History:   does not have a smoking history on file. He does not have any smokeless tobacco history on file. His alcohol and drug histories not on file.  Family History:  History reviewed. No pertinent family history.  Physical Exam:  Filed Vitals:   05/26/11 0401 05/26/11 0731 05/26/11 0741  BP: 146/55  143/60  Pulse: 82  78  Temp: 100.2 F (37.9 C)  100.7 F (38.2 C)  TempSrc: Oral  Oral  Resp: 16  18  Height: 5\' 7"  (1.702 m)    Weight: 89.721 kg (197 lb 12.8 oz)    SpO2:  92% 94% 94%      General: Alert and oriented times three, not in acute distress, suboptimal hydration.  Eyes: PERRLA,Mild Pallor, Anicteric  ENT: Dry oral mucosa, neck supple, no thyromegaly  Lungs: Rales mid and lower zones of the lung fields, with minimally scattered rhonchi,.  Cardiovascular: regular rate and rhythm, no regurgitation, no gallops, no murmurs. No carotid bruits, no JVD,  pacemaker in situ left upper thorax  Abdomen: soft, positive BS, non-tender, non-distended, no organomegaly, not an acute abdomen  GU: not examined  Neuro: Non focal  Musculoskeletal: Arthritic changes in the knees and feet.  Extremity-+1 pedal edema  Skin: Decreased turgor  Neuro Psych: appropriate patient  ?  Labs on Admission:   [facility] 05/26/11 0608  NA 139  K 4.2  CL 99  CO2 --  GLUCOSE 173*  BUN 20  CREATININE 1.40*  CALCIUM --  MG --  PHOS --    No results found for this basename: AST:2,ALT:2,ALKPHOS:2,BILITOT:2,PROT:2,ALBUMIN:2 in the last 72 hours  No results found for this basename: LIPASE:2,AMYLASE:2 in the last 72 hours   Basename 05/26/11 0608 05/26/11 0536  WBC -- 10.1  NEUTROABS -- 7.7  HGB 12.2* 11.3*  HCT 36.0* 34.2*  MCV -- 94.2  PLT -- 212    No results found for this basename: CKTOTAL:3,CKMB:3,CKMBINDEX:3,TROPONINI:3 in the last 72 hours  No results found for this basename: TSH,T4TOTAL,FREET3,T3FREE,THYROIDAB in the last 72 hours  No results found for this basename: VITAMINB12:2,FOLATE:2,FERRITIN:2,TIBC:2,IRON:2,RETICCTPCT:2 in the last 72 hours  Radiological Exams on Admission:  Dg Chest 2 View  05/26/2011  *RADIOLOGY REPORT*  Clinical Data: Nonproductive cough and shortness of breath. History of diabetes.  CHEST - 2 VIEW  Comparison: Chest radiograph performed 03/08/2011  Findings: The lungs are well-aerated.  Patchy left lower lobe airspace opacification raises concern for pneumonia.  There is no evidence of pleural effusion or pneumothorax.  The heart is borderline normal in size; the mediastinal contour is within normal limits. A pacemaker is noted at the left chest wall, with leads ending at the right atrium and right ventricle.  No acute osseous abnormalities are seen.  IMPRESSION: Patchy left lower lobe pneumonia noted.  Original Report Authenticated By: Tonia Ghent, M.D.    Assessment/Plan  Problems: #1 cough #2 fever #3  dehydration  Impression: #1 acute febrile illness-questionable Flu, pneumonia. #2 history of congestive heart failure-not otherwise specified. #3 history of congenita bradycardia status post pacemaker. #4 hypertension #5 diabetes mellitus #6 hyperlipidemia #7 renal insufficiency.  Plan: #1 admit to telemetry. #2 give oxygen via nasal canulla #3 start IV Rocephin as well as Zithromax #4 treat for flu with Tamiflu #5 gentle IV hydration #6 given breathing treatment #7 restart home meds #8 Lasix; cardiac enzyme every 8x3, flu PCR, blood culture x2, CBC, CMP and magnesium repeated in a.m. Imaging-2-D echo Patient will be evaluated on daily basis.  Talmage Nap   [phone]

## 2011-05-26 NOTE — ED Notes (Signed)
Pt transferring to room 1431 at this time. Stable condition, no c/o.

## 2011-05-26 NOTE — ED Notes (Signed)
Paged RT for breathing treatment

## 2011-05-26 NOTE — ED Notes (Signed)
Unable to obtain 2nd culture ok to cancel per Dr. Nicanor Alcon, MD.  1st set of bld culture and rainbow sent to lab

## 2011-05-26 NOTE — ED Notes (Signed)
Pt alert and oriented x4. Respirations even and unlabored, bilateral symmetrical rise and fall of chest. Skin warm and dry. In no acute distress. Denies needs. Caretaker at bedside. Pt resting in bed.

## 2011-05-26 NOTE — ED Notes (Signed)
RT notified of breathing treatment due

## 2011-05-26 NOTE — ED Notes (Signed)
Rn notified by lab about hgb level dropping. rn paged dr to alert him of change.

## 2011-05-26 NOTE — ED Notes (Signed)
Report given to gretchen, rn in TCU. Pt moved to [employer]

## 2011-05-26 NOTE — ED Notes (Signed)
Blood culture collected by night shift nurse. Day shift RN clicked off so blood cultures could be processed.

## 2011-05-26 NOTE — ED Notes (Signed)
Pt began to have a worsening cough x 1 day ago around 1200.  Pt describes the cough as coming in "spells" and he can't stop coughing once he starts. The cough is stated to be non productive.

## 2011-05-26 NOTE — Progress Notes (Signed)
  Echocardiogram 2D Echocardiogram has been performed.  Rayetta Humphrey 05/26/2011, 11:49 AM

## 2011-05-26 NOTE — ED Notes (Addendum)
Pt st's after he left the wound clinic yesterday he started coughing, that progressed into a worse, non-productive cough.  St's the chest pain is related to the cough.  Pt not SOB right now, st's he becomes SOB only when he has coughing spells.  Breath sounds clear and equal bilaterally, slightly diminished in the lower lobes.  St's he hasn't felt sick lately.  First reported fever was tonight in triage.  Pt reports no new onset of confusion.

## 2011-05-26 NOTE — ED Notes (Signed)
Echo being done at bedside.

## 2011-05-26 NOTE — ED Provider Notes (Signed)
History     CSN: [id number]  Arrival date & time 05/26/11  0348   First MD Initiated Contact with Patient 05/26/11 0414      Chief Complaint  Patient presents with  . Cough  . Shortness of Breath    (Consider location/radiation/quality/duration/timing/severity/associated sxs/prior treatment) Patient is a 78 y.o. male presenting with cough and shortness of breath. The history is provided by the patient. No language interpreter was used.  Cough This is a new problem. The current episode started 6 to 12 hours ago. The problem occurs every few minutes. The problem has been gradually worsening. The cough is non-productive. The maximum temperature recorded prior to his arrival was 100 to 100.9 F. The fever has been present for less than 1 day. Associated symptoms include sweats and shortness of breath. Pertinent negatives include no chest pain, no weight loss, no headaches, no rhinorrhea, no sore throat and no myalgias. He has tried nothing for the symptoms. The treatment provided no relief. He is not a smoker. His past medical history does not include asthma.  Shortness of Breath  The current episode started yesterday. The onset was sudden. The problem occurs continuously. The problem has been gradually worsening. The problem is severe. The symptoms are relieved by nothing. The symptoms are aggravated by nothing. Associated symptoms include a fever, cough and shortness of breath. Pertinent negatives include no chest pain, no rhinorrhea and no sore throat. There was no intake of a foreign body. His past medical history does not include asthma. He has been behaving normally. Recently, medical care has been given by a specialist.    Past Medical History  Diagnosis Date  . CHF (congestive heart failure)   . Diabetes mellitus   . Hypertension   . Hyperlipidemia     Past Surgical History  Procedure Date  . Lumbar laminectomy     History reviewed. No pertinent family history.  History    Substance Use Topics  . Smoking status: Not on file  . Smokeless tobacco: Not on file  . Alcohol Use:       Review of Systems  Constitutional: Positive for fever and fatigue. Negative for weight loss.  HENT: Negative for sore throat and rhinorrhea.   Eyes: Negative.   Respiratory: Positive for cough and shortness of breath.   Cardiovascular: Negative for chest pain.  Gastrointestinal: Negative.   Genitourinary: Negative.   Musculoskeletal: Negative for myalgias.  Neurological: Negative for headaches.  Hematological: Negative.   Psychiatric/Behavioral: Negative.     Allergies  Review of patient's allergies indicates no known allergies.  Home Medications   Current Outpatient Rx  Name Route Sig Dispense Refill  . AMLODIPINE BESYLATE 10 MG PO TABS Oral Take 10 mg by mouth daily.    . ASPIRIN 81 MG PO TABS Oral Take 81 mg by mouth daily.     [name] FERROUS SULFATE 325 (65 FE) MG PO TABS Oral Take 325 mg by mouth daily with breakfast.    . FUROSEMIDE 80 MG PO TABS Oral Take 80 mg by mouth daily.     [name] HYDROCODONE-ACETAMINOPHEN 7.5-500 MG PO TABS Oral Take 1 tablet by mouth every 6 (six) hours as needed.    . ISOSORBIDE MONONITRATE ER 30 MG PO TB24 Oral Take 30 mg by mouth daily.    [name] LORAZEPAM 0.5 MG PO TABS Oral Take 0.5 mg by mouth every 8 (eight) hours.    [name] POTASSIUM CHLORIDE CRYS ER 20 MEQ PO TBCR Oral Take 20 mEq by  mouth daily.    [name] VALSARTAN 160 MG PO TABS Oral Take 160 mg by mouth 2 (two) times daily.       BP 146/55  Pulse 82  Temp(Src) 100.2 F (37.9 C) (Oral)  Resp 16  Ht 5\' 7"  (1.702 m)  Wt 197 lb 12.8 oz (89.721 kg)  BMI 30.98 kg/m2  SpO2 92%  Physical Exam  Constitutional: He is oriented to person, place, and time. He appears well-developed and well-nourished. No distress.  HENT:  Head: Normocephalic and atraumatic.  Mouth/Throat: Oropharynx is clear and moist. No oropharyngeal exudate.  Eyes: Conjunctivae are normal. Pupils are equal, round, and reactive  to light.  Neck: Normal range of motion. Neck supple.  Cardiovascular: Normal rate and regular rhythm.   Pulmonary/Chest: He has decreased breath sounds. He has wheezes. He has rhonchi in the left lower field. He has rales in the left lower field.  Abdominal: Soft. Bowel sounds are normal. There is no tenderness.  Musculoskeletal: Normal range of motion.  Neurological: He is alert and oriented to person, place, and time.  Skin: Skin is warm and dry.  Psychiatric: Thought content normal.    ED Course  Procedures (including critical care time)   Labs Reviewed  CBC  DIFFERENTIAL  I-STAT, CHEM 8  CULTURE, BLOOD (ROUTINE X 2)  CULTURE, BLOOD (ROUTINE X 2)   No results found.   No diagnosis found.    MDM  Feeling improved post neb treatment.  Will start rocephin and azithro and admit to medicine.          Jasmine Awe, MD 05/26/11 [phone]

## 2011-05-26 NOTE — ED Notes (Signed)
Unable to obtain blood from IV line.  Phlebotomy notified.

## 2011-05-26 NOTE — ED Notes (Signed)
Dr called RN, dr alerted of hgb level. Verbal order from dr to just watch hgb level for now.

## 2011-05-27 LAB — CARDIAC PANEL(CRET KIN+CKTOT+MB+TROPI)
Relative Index: 0.7 (ref 0.0–2.5)
Total CK: 452 U/L — ABNORMAL HIGH (ref 7–232)
Troponin I: <0.3 ng/mL (ref <0.30)

## 2011-05-27 LAB — CBC
Hemoglobin: 11.1 g/dL — ABNORMAL LOW (ref 13.0–17.0)
MCHC: 32.6 g/dL (ref 30.0–36.0)
RDW: 13.7 % (ref 11.5–15.5)

## 2011-05-27 LAB — DIFFERENTIAL
Basophils Absolute: 0 10*3/uL (ref 0.0–0.1)
Basophils Relative: 0 % (ref 0–1)
Neutro Abs: 10 10*3/uL — ABNORMAL HIGH (ref 1.7–7.7)
Neutrophils Relative %: 79 % — ABNORMAL HIGH (ref 43–77)

## 2011-05-27 LAB — COMPREHENSIVE METABOLIC PANEL
AST: 20 U/L (ref 0–37)
Albumin: 2.9 g/dL — ABNORMAL LOW (ref 3.5–5.2)
Alkaline Phosphatase: 68 U/L (ref 39–117)
Chloride: 98 mEq/L (ref 96–112)
Potassium: 4.1 mEq/L (ref 3.5–5.1)
Total Bilirubin: 0.5 mg/dL (ref 0.3–1.2)

## 2011-05-27 LAB — GLUCOSE, CAPILLARY
Glucose-Capillary: 284 mg/dL — ABNORMAL HIGH (ref 70–99)
Glucose-Capillary: 253 mg/dL — ABNORMAL HIGH (ref 70–99)

## 2011-05-27 MED ORDER — IPRATROPIUM BROMIDE 0.02 % IN SOLN
0.5000 mg | Freq: Three times a day (TID) | RESPIRATORY_TRACT | Status: DC
  Administered 2011-05-28: 0.5 mg via RESPIRATORY_TRACT
  Filled 2011-05-27 (×2): qty 2.5

## 2011-05-27 MED ORDER — IPRATROPIUM BROMIDE 0.02 % IN SOLN
0.5000 mg | Freq: Four times a day (QID) | RESPIRATORY_TRACT | Status: DC
  Administered 2011-05-27 (×4): 0.5 mg via RESPIRATORY_TRACT
  Filled 2011-05-27 (×4): qty 2.5

## 2011-05-27 MED ORDER — ALBUTEROL SULFATE (5 MG/ML) 0.5% IN NEBU
2.5000 mg | INHALATION_SOLUTION | Freq: Four times a day (QID) | RESPIRATORY_TRACT | Status: DC
  Administered 2011-05-27 (×4): 2.5 mg via RESPIRATORY_TRACT
  Filled 2011-05-27 (×4): qty 0.5

## 2011-05-27 MED ORDER — ALBUTEROL SULFATE (5 MG/ML) 0.5% IN NEBU
2.5000 mg | INHALATION_SOLUTION | Freq: Three times a day (TID) | RESPIRATORY_TRACT | Status: DC
  Administered 2011-05-28: 2.5 mg via RESPIRATORY_TRACT
  Filled 2011-05-27 (×2): qty 0.5

## 2011-05-27 MED ORDER — GUAIFENESIN ER 600 MG PO TB12
600.0000 mg | ORAL_TABLET | Freq: Two times a day (BID) | ORAL | Status: DC
  Administered 2011-05-27 – 2011-05-28 (×3): 600 mg via ORAL
  Filled 2011-05-27 (×4): qty 1

## 2011-05-27 NOTE — Progress Notes (Addendum)
Cm spoke with Dr.Jabbour, retired Development worker, international aid, concerning d/;c planning. Patient states having live in care taker, Johnny Bridge, 24 hours a day.Pt states never being left alone, when martha is not there someone else is there to supervise pt.Patient states no other HH services or DME needed at this time. Awaiting PT eval. Pt states having 7 adult children whom rotate weekends with pt. Pt given choice list in case plan of care changes.  Leonie Green [phone]

## 2011-05-27 NOTE — Progress Notes (Signed)
Patient ID: [name], male   DOB: [birth year], 78 y.o.   MRN: [id number] Subjective: Patient seen. Feels better. Complain about difficulty to expectorate  Objective: Weight change: -0.452 kg (-15.9 oz)  Intake/Output Summary (Last 24 hours) at 05/27/11 1042 Last data filed at 05/27/11 1030  Gross per 24 hour  Intake    780 ml  Output   2350 ml  Net  -1570 ml   BP 154/70  Pulse 82  Temp(Src) 98.7 F (37.1 C) (Oral)  Resp 18  Ht 5\' 7"  (1.702 m)  Wt 86.3 kg (190 lb 4.1 oz)  BMI 29.80 kg/m2  SpO2 92% Physical Exam: General appearance: alert, cooperative and no distress,.very pleasant,improved hydration Head: Normocephalic, without obvious abnormality, atraumatic Neck: no adenopathy, no carotid bruit, no JVD, supple, symmetrical, trachea midline and thyroid not enlarged, symmetric, no tenderness/mass/nodules Lungs: clear to auscultation bilaterally Heart: regular rate and rhythm, S1, S2 normal, no murmur, click, rub or gallop Abdomen: soft, non-tender; bowel sounds normal; no masses,  no organomegaly Extremities: extremities normal, atraumatic, no cyanosis or edema Skin: improve turgor  Lab Results: Results for orders placed during the hospital encounter of 05/26/11 (from the past 48 hour(s))  CBC     Status: Abnormal   Collection Time   05/26/11  5:36 AM      Component Value Range Comment   WBC 10.1  4.0 - 10.5 (K/uL)    RBC 3.63 (*) 4.22 - 5.81 (MIL/uL)    Hemoglobin 11.3 (*) 13.0 - 17.0 (g/dL)    HCT 40.9 (*) 81.1 - 52.0 (%)    MCV 94.2  78.0 - 100.0 (fL)    MCH 31.1  26.0 - 34.0 (pg)    MCHC 33.0  30.0 - 36.0 (g/dL)    RDW 91.4  78.2 - 95.6 (%)    Platelets 212  150 - 400 (K/uL)   DIFFERENTIAL     Status: Normal   Collection Time   05/26/11  5:36 AM      Component Value Range Comment   Neutrophils Relative 77  43 - 77 (%)    Neutro Abs 7.7  1.7 - 7.7 (K/uL)    Lymphocytes Relative 14  12 - 46 (%)    Lymphs Abs 1.4  0.7 - 4.0 (K/uL)    Monocytes Relative 9  3 -  12 (%)    Monocytes Absolute 0.9  0.1 - 1.0 (K/uL)    Eosinophils Relative 1  0 - 5 (%)    Eosinophils Absolute 0.1  0.0 - 0.7 (K/uL)    Basophils Relative 0  0 - 1 (%)    Basophils Absolute 0.0  0.0 - 0.1 (K/uL)   CULTURE, BLOOD (ROUTINE X 2)     Status: Normal (Preliminary result)   Collection Time   05/26/11  5:36 AM      Component Value Range Comment   Specimen Description BLOOD RIGHT ANTECUBITAL      Special Requests BOTTLES DRAWN AEROBIC AND ANAEROBIC 4CC      Setup Time [id number]      Culture        Value:        BLOOD CULTURE RECEIVED NO GROWTH TO DATE CULTURE WILL BE HELD FOR 5 DAYS BEFORE ISSUING A FINAL NEGATIVE REPORT   Report Status PENDING     POCT I-STAT, CHEM 8     Status: Abnormal   Collection Time   05/26/11  6:08 AM      Component Value Range Comment  Sodium 139  135 - 145 (mEq/L)    Potassium 4.2  3.5 - 5.1 (mEq/L)    Chloride 99  96 - 112 (mEq/L)    BUN 20  6 - 23 (mg/dL)    Creatinine, Ser 0.96 (*) 0.50 - 1.35 (mg/dL)    Glucose, Bld 045 (*) 70 - 99 (mg/dL)    Calcium, Ion 4.09  1.12 - 1.32 (mmol/L)    TCO2 30  0 - 100 (mmol/L)    Hemoglobin 12.2 (*) 13.0 - 17.0 (g/dL)    HCT 81.1 (*) 91.4 - 52.0 (%)   PRO B NATRIURETIC PEPTIDE     Status: Abnormal   Collection Time   05/26/11  8:40 AM      Component Value Range Comment   Pro B Natriuretic peptide (BNP) 2522.0 (*) 0 - 450 (pg/mL)   CULTURE, BLOOD (ROUTINE X 2)     Status: Normal (Preliminary result)   Collection Time   05/26/11  8:40 AM      Component Value Range Comment   Specimen Description BLOOD LEFT ARM  5 ML IN [facility] BOTTLE      Special Requests NONE      Setup Time [id number]      Culture        Value:        BLOOD CULTURE RECEIVED NO GROWTH TO DATE CULTURE WILL BE HELD FOR 5 DAYS BEFORE ISSUING A FINAL NEGATIVE REPORT   Report Status PENDING     CARDIAC PANEL(CRET KIN+CKTOT+MB+TROPI)     Status: Normal   Collection Time   05/26/11  8:40 AM      Component Value Range Comment   Total CK 179   7 - 232 (U/L)    CK, MB 2.5  0.3 - 4.0 (ng/mL)    Troponin I <0.30  <0.30 (ng/mL)    Relative Index 1.4  0.0 - 2.5    CBC     Status: Abnormal   Collection Time   05/26/11  8:40 AM      Component Value Range Comment   WBC 10.6 (*) 4.0 - 10.5 (K/uL)    RBC 3.00 (*) 4.22 - 5.81 (MIL/uL)    Hemoglobin 9.4 (*) 13.0 - 17.0 (g/dL)    HCT 57.8 (*) 46.9 - 52.0 (%)    MCV 95.0  78.0 - 100.0 (fL)    MCH 31.3  26.0 - 34.0 (pg)    MCHC 33.0  30.0 - 36.0 (g/dL)    RDW 62.9  52.8 - 41.3 (%)    Platelets 189  150 - 400 (K/uL)   CREATININE, SERUM     Status: Abnormal   Collection Time   05/26/11  8:40 AM      Component Value Range Comment   Creatinine, Ser 1.14  0.50 - 1.35 (mg/dL)    GFR calc non Af Amer 55 (*) >90 (mL/min)    GFR calc Af Amer 63 (*) >90 (mL/min)   GLUCOSE, CAPILLARY     Status: Abnormal   Collection Time   05/26/11  9:31 AM      Component Value Range Comment   Glucose-Capillary 223 (*) 70 - 99 (mg/dL)    Comment 1 Notify RN     INFLUENZA PANEL BY PCR     Status: Normal   Collection Time   05/26/11 11:49 AM      Component Value Range Comment   Influenza A By PCR NEGATIVE  NEGATIVE     Influenza B By PCR  NEGATIVE  NEGATIVE     H1N1 flu by pcr NOT DETECTED  NOT DETECTED    GLUCOSE, CAPILLARY     Status: Abnormal   Collection Time   05/26/11 11:58 AM      Component Value Range Comment   Glucose-Capillary 202 (*) 70 - 99 (mg/dL)    Comment 1 Documented in Chart      Comment 2 Notify RN     GLUCOSE, CAPILLARY     Status: Abnormal   Collection Time   05/26/11  4:50 PM      Component Value Range Comment   Glucose-Capillary 236 (*) 70 - 99 (mg/dL)   CARDIAC PANEL(CRET KIN+CKTOT+MB+TROPI)     Status: Abnormal   Collection Time   05/26/11  4:53 PM      Component Value Range Comment   Total CK 304 (*) 7 - 232 (U/L)    CK, MB 3.0  0.3 - 4.0 (ng/mL)    Troponin I <0.30  <0.30 (ng/mL)    Relative Index 1.0  0.0 - 2.5    GLUCOSE, CAPILLARY     Status: Abnormal   Collection Time    05/26/11  8:22 PM      Component Value Range Comment   Glucose-Capillary 275 (*) 70 - 99 (mg/dL)   CARDIAC PANEL(CRET KIN+CKTOT+MB+TROPI)     Status: Abnormal   Collection Time   05/26/11 11:56 PM      Component Value Range Comment   Total CK 452 (*) 7 - 232 (U/L)    CK, MB 3.3  0.3 - 4.0 (ng/mL)    Troponin I <0.30  <0.30 (ng/mL)    Relative Index 0.7  0.0 - 2.5    CBC     Status: Abnormal   Collection Time   05/27/11  5:21 AM      Component Value Range Comment   WBC 12.7 (*) 4.0 - 10.5 (K/uL)    RBC 3.62 (*) 4.22 - 5.81 (MIL/uL)    Hemoglobin 11.1 (*) 13.0 - 17.0 (g/dL)    HCT 40.9 (*) 81.1 - 52.0 (%)    MCV 94.2  78.0 - 100.0 (fL)    MCH 30.7  26.0 - 34.0 (pg)    MCHC 32.6  30.0 - 36.0 (g/dL)    RDW 91.4  78.2 - 95.6 (%)    Platelets 204  150 - 400 (K/uL)   DIFFERENTIAL     Status: Abnormal   Collection Time   05/27/11  5:21 AM      Component Value Range Comment   Neutrophils Relative 79 (*) 43 - 77 (%)    Neutro Abs 10.0 (*) 1.7 - 7.7 (K/uL)    Lymphocytes Relative 9 (*) 12 - 46 (%)    Lymphs Abs 1.2  0.7 - 4.0 (K/uL)    Monocytes Relative 10  3 - 12 (%)    Monocytes Absolute 1.2 (*) 0.1 - 1.0 (K/uL)    Eosinophils Relative 2  0 - 5 (%)    Eosinophils Absolute 0.3  0.0 - 0.7 (K/uL)    Basophils Relative 0  0 - 1 (%)    Basophils Absolute 0.0  0.0 - 0.1 (K/uL)   COMPREHENSIVE METABOLIC PANEL     Status: Abnormal   Collection Time   05/27/11  5:21 AM      Component Value Range Comment   Sodium 135  135 - 145 (mEq/L)    Potassium 4.1  3.5 - 5.1 (mEq/L)    Chloride 98  96 - 112 (mEq/L)    CO2 30  19 - 32 (mEq/L)    Glucose, Bld 215 (*) 70 - 99 (mg/dL)    BUN 17  6 - 23 (mg/dL)    Creatinine, Ser 5.62  0.50 - 1.35 (mg/dL)    Calcium 9.0  8.4 - 10.5 (mg/dL)    Total Protein 6.9  6.0 - 8.3 (g/dL)    Albumin 2.9 (*) 3.5 - 5.2 (g/dL)    AST 20  0 - 37 (U/L)    ALT 10  0 - 53 (U/L)    Alkaline Phosphatase 68  39 - 117 (U/L)    Total Bilirubin 0.5  0.3 - 1.2 (mg/dL)     GFR calc non Af Amer 53 (*) >90 (mL/min)    GFR calc Af Amer 61 (*) >90 (mL/min)   MAGNESIUM     Status: Normal   Collection Time   05/27/11  5:21 AM      Component Value Range Comment   Magnesium 1.9  1.5 - 2.5 (mg/dL)   GLUCOSE, CAPILLARY     Status: Abnormal   Collection Time   05/27/11  7:44 AM      Component Value Range Comment   Glucose-Capillary 243 (*) 70 - 99 (mg/dL)     Micro Results: Recent Results (from the past 240 hour(s))  CULTURE, BLOOD (ROUTINE X 2)     Status: Normal (Preliminary result)   Collection Time   05/26/11  5:36 AM      Component Value Range Status Comment   Specimen Description BLOOD RIGHT ANTECUBITAL   Final    Special Requests BOTTLES DRAWN AEROBIC AND ANAEROBIC 4CC   Final    Setup Time [id number]   Final    Culture     Final    Value:        BLOOD CULTURE RECEIVED NO GROWTH TO DATE CULTURE WILL BE HELD FOR 5 DAYS BEFORE ISSUING A FINAL NEGATIVE REPORT   Report Status PENDING   Incomplete   CULTURE, BLOOD (ROUTINE X 2)     Status: Normal (Preliminary result)   Collection Time   05/26/11  8:40 AM      Component Value Range Status Comment   Specimen Description BLOOD LEFT ARM  5 ML IN [facility] BOTTLE   Final    Special Requests NONE   Final    Setup Time [id number]   Final    Culture     Final    Value:        BLOOD CULTURE RECEIVED NO GROWTH TO DATE CULTURE WILL BE HELD FOR 5 DAYS BEFORE ISSUING A FINAL NEGATIVE REPORT   Report Status PENDING   Incomplete     Studies/Results: Dg Chest 2 View  05/26/2011  *RADIOLOGY REPORT*  Clinical Data: Nonproductive cough and shortness of breath. History of diabetes.  CHEST - 2 VIEW  Comparison: Chest radiograph performed 03/08/2011  Findings: The lungs are well-aerated.  Patchy left lower lobe airspace opacification raises concern for pneumonia.  There is no evidence of pleural effusion or pneumothorax.  The heart is borderline normal in size; the mediastinal contour is within normal limits. A pacemaker is noted  at the left chest wall, with leads ending at the right atrium and right ventricle.  No acute osseous abnormalities are seen.  IMPRESSION: Patchy left lower lobe pneumonia noted.  Original Report Authenticated By: Tonia Ghent, M.D.   Medications: Scheduled Meds:   . albuterol  2.5 mg Nebulization Q6H  .  amLODipine  10 mg Oral Daily  . aspirin EC  81 mg Oral Daily  . azithromycin  500 mg Intravenous Q24H  . cefTRIAXone (ROCEPHIN)  IV  1 g Intravenous Q24H  . enoxaparin  40 mg Subcutaneous Q24H  . ferrous sulfate  325 mg Oral Q breakfast  . furosemide  40 mg Intravenous Daily  . insulin aspart  0-15 Units Subcutaneous TID WC  . ipratropium  0.5 mg Nebulization Q6H  . isosorbide mononitrate  30 mg Oral Daily  . LORazepam  0.5 mg Oral Q8H  . olmesartan  10 mg Oral Daily  . oseltamivir  75 mg Oral BID  . pantoprazole (PROTONIX) IV  40 mg Intravenous Q24H  . potassium chloride SA  20 mEq Oral Daily  . DISCONTD: albuterol  2.5 mg Nebulization Q4H  . DISCONTD: ipratropium  0.5 mg Nebulization Q4H   Continuous Infusions:   . sodium chloride 50 mL/hr at 05/27/11 0125   PRN Meds:.HYDROcodone-acetaminophen, ibuprofen  Assessment/Plan: #1 upper respiratory tract infection questionable pneumonia-will  continue Tamiflu, Zithromax and Rocephin. #2 dehydration-we'll continue gentle IV hydration #3 diabetes mellitus-continue insulin regimen #4 hypertension Will continue Benicar. 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

## 2011-05-28 ENCOUNTER — Encounter: Payer: Medicare Other | Admitting: Physical Therapy

## 2011-05-28 LAB — DIFFERENTIAL
Basophils Absolute: 0 10*3/uL (ref 0.0–0.1)
Basophils Relative: 0 % (ref 0–1)
Eosinophils Relative: 2 % (ref 0–5)
Monocytes Absolute: 1.3 10*3/uL — ABNORMAL HIGH (ref 0.1–1.0)

## 2011-05-28 LAB — CBC
HCT: 32 % — ABNORMAL LOW (ref 39.0–52.0)
MCHC: 33.4 g/dL (ref 30.0–36.0)
MCV: 93.3 fL (ref 78.0–100.0)
RDW: 13.4 % (ref 11.5–15.5)

## 2011-05-28 LAB — GLUCOSE, CAPILLARY: Glucose-Capillary: 232 mg/dL — ABNORMAL HIGH (ref 70–99)

## 2011-05-28 LAB — COMPREHENSIVE METABOLIC PANEL
AST: 24 U/L (ref 0–37)
Albumin: 2.9 g/dL — ABNORMAL LOW (ref 3.5–5.2)
Calcium: 9 mg/dL (ref 8.4–10.5)
Creatinine, Ser: 1.15 mg/dL (ref 0.50–1.35)
Total Protein: 7.1 g/dL (ref 6.0–8.3)

## 2011-05-28 MED ORDER — IPRATROPIUM BROMIDE HFA 17 MCG/ACT IN AERS: 2.0000 | INHALATION_SPRAY | Freq: Four times a day (QID) | RESPIRATORY_TRACT | Status: DC

## 2011-05-28 MED ORDER — GUAIFENESIN ER 600 MG PO TB12: 600.0000 mg | ORAL_TABLET | Freq: Two times a day (BID) | ORAL | Status: DC

## 2011-05-28 MED ORDER — LEVOFLOXACIN 500 MG PO TABS: 500.0000 mg | ORAL_TABLET | Freq: Every day | ORAL | Status: AC

## 2011-05-28 NOTE — Progress Notes (Addendum)
Inpatient Diabetes Program Recommendations  AACE/ADA: New Consensus Statement on Inpatient Glycemic Control (2009)  Target Ranges:  Prepandial:   less than 140 mg/dL      Peak postprandial:   less than 180 mg/dL (1-2 hours)      Critically ill patients:  140 - 180 mg/dL   Reason for Visit:Results for VERBON, GIANGREGORIO (MRN [id number]) as of 05/28/2011 12:30  Ref. Range 05/27/2011 07:44 05/27/2011 12:04 05/27/2011 17:06 05/27/2011 22:14 05/28/2011 07:38 05/28/2011 11:46  Glucose-Capillary Latest Range: 70-99 mg/dL 409 (H) 811 (H) 914 (H) 253 (H) 232 (H) 306 (H)   Note that patients home dose of Lantus was 12 units daily. CBG's greater than goal.  Inpatient Diabetes Program Recommendations Insulin - Basal: Please restart home dose of Lantus 12 units daily. Insulin - Meal Coverage: Consider adding Novolog 3 units tid with meals.  Note: Will follow.

## 2011-05-28 NOTE — Progress Notes (Addendum)
Physical Therapy Evaluation Patient Details Name: [name] MRN: [id number] DOB: 1919/04/19 Today's Date: 05/28/2011 Time: 409-811 Charge: EVII  Problem List: There is no problem list on file for this patient.   Past Medical History:  Past Medical History  Diagnosis Date  . CHF (congestive heart failure)   . Diabetes mellitus   . Hypertension   . Hyperlipidemia    Past Surgical History:  Past Surgical History  Procedure Date  . Lumbar laminectomy     PT Assessment/Plan/Recommendation PT Assessment Clinical Impression Statement: Pt would benefit from acute PT services in order to improve independence with ambulation and stairs and decrease SOB with activity.  Pt given verbal cues to breath in through nose with [place] 2* SOB with ambulation without oxygen (SaO2 87% upon return to room).  Pt will likely progress well, but will recommend HHPT at this point.  Pt reports he will think about HHPT sevices. PT Recommendation/Assessment: Patient will need skilled PT in the acute care venue PT Problem List: Decreased strength;Decreased mobility;Cardiopulmonary status limiting activity PT Therapy Diagnosis : Generalized weakness;Difficulty walking PT Plan PT Frequency: Min 3X/week PT Treatment/Interventions: DME instruction;Gait training;Stair training;Functional mobility training;Therapeutic activities;Therapeutic exercise;Patient/family education;Balance training PT Recommendation Follow Up Recommendations: Home health PT Equipment Recommended: None recommended by PT PT Goals  Acute Rehab PT Goals PT Goal Formulation: With patient Time For Goal Achievement: 7 days Pt will go Supine/Side to Sit: with modified independence PT Goal: Supine/Side to Sit - Progress: Not met Pt will go Sit to Stand: with modified independence PT Goal: Sit to Stand - Progress: Not met Pt will go Stand to Sit: with modified independence PT Goal: Stand to Sit - Progress: Not met Pt will Ambulate: >150  feet;with modified independence;with least restrictive assistive device PT Goal: Ambulate - Progress: Not met Pt will Go Up / Down Stairs: 3-5 stairs;with supervision;with rail(s) PT Goal: Up/Down Stairs - Progress: Not met Pt will Perform Home Exercise Program: with supervision, verbal cues required/provided PT Goal: Perform Home Exercise Program - Progress: Not met  PT Evaluation Precautions/Restrictions  Precautions Required Braces or Orthoses: Yes Spinal Brace: Lumbar corset Other Brace/Splint: Pt reports wearing lumbar corset. Also has L AFO for foot drop but AFO not currently in room Restrictions Weight Bearing Restrictions: No Prior Functioning  Home Living Lives With: Alone Receives Help From:  (per chart, 24/7 caretaker and children frequently check in) Type of Home: House Home Layout: One level Home Access: Stairs to enter Entrance Stairs-Rails: Can reach both Entrance Stairs-Number of Steps: 3 Home Adaptive Equipment: Walker - rolling Prior Function Level of Independence: Independent with basic ADLs;Independent with transfers;Independent with gait Cognition Cognition Arousal/Alertness: Awake/alert Overall Cognitive Status: Appears within functional limits for tasks assessed Orientation Level: Oriented X4 Sensation/Coordination Sensation Additional Comments: Pt with bandaging over R knee and reports he has been going to wound center since quadriceps tendon repair surgery in September. Extremity Assessment RLE Strength RLE Overall Strength: Within Functional Limits for tasks assessed LLE Assessment LLE Assessment: Exceptions to [facility] LLE Strength LLE Overall Strength: Deficits;Due to premorbid status;Other (Comment) (L foot drop) Mobility (including Balance) Bed Mobility Bed Mobility: No Transfers Transfers: Yes Sit to Stand: 4: Min assist;With armrests;From chair/3-in-1 Sit to Stand Details (indicate cue type and reason): min/guard for unsteadiness with initial  stand but able to self correct Stand to Sit: 4: Min assist;With armrests;To chair/3-in-1 Stand to Sit Details: min/guard, verbal cue for use of armrests Ambulation/Gait Ambulation/Gait: Yes Ambulation/Gait Assistance: 5: Supervision Ambulation/Gait Assistance Details (indicate  cue type and reason): supervision 2* pt not having L AFO, pt reports SOB with ambulation, SaO2 87% room air upon return to recliner so placed 2L [place] back on pt, (pt had O2 off to shave upon entering room) Ambulation Distance (Feet): 120 Feet Assistive device: Rolling walker Gait Pattern: Decreased dorsiflexion - left;Trunk flexed (increased hip/knee flexion on L to compensate for foot drop)    Exercise    End of Session PT - End of Session Activity Tolerance: Patient tolerated treatment well Patient left: in chair;with call bell in reach;with family/visitor present General Behavior During Session: [facility] for tasks performed Cognition: [facility] for tasks performed  Franchelle Foskett,KATHrine E 05/28/2011, 9:45 AM Pager: [phone]

## 2011-05-28 NOTE — Discharge Summary (Signed)
DISCHARGE SUMMARY  [name]  MR#: [id number]  DOB:15-Feb-1919  Date of Admission: 05/26/2011 Date of Discharge: 05/28/2011  Attending Physician:Lena Gores  Patient's QIO:NGEXB,MWUXLKG S, MD, MD  Consults:   Discharge Diagnoses: #1 upper respiratory tract infection. #2 questionable pneumonia (left lower lobe) #3 dehydration #4 history of congenital bradycardia status post pacemaker #5 hypertension #6 hyperlipidemia #7 diabetes mellitus #8 benign sufficiency #9 history of congestive heart failure questionable systolic dysfunction.    Current Discharge Medication List    START taking these medications   Details  guaiFENesin (MUCINEX) 600 MG 12 hr tablet Take 1 tablet (600 mg total) by mouth 2 (two) times daily. Qty: 30 tablet, Refills: 0    ipratropium (ATROVENT HFA) 17 MCG/ACT inhaler Inhale 2 puffs into the lungs every 6 (six) hours. Qty: 1 Inhaler, Refills: 12    levofloxacin (LEVAQUIN) 500 MG tablet Take 1 tablet (500 mg total) by mouth daily. Qty: 7 tablet, Refills: 0      CONTINUE these medications which have NOT CHANGED   Details  amLODipine (NORVASC) 10 MG tablet Take 10 mg by mouth daily.    aspirin 81 MG tablet Take 81 mg by mouth daily.     ferrous sulfate 325 (65 FE) MG tablet Take 325 mg by mouth daily with breakfast.    furosemide (LASIX) 80 MG tablet Take 80 mg by mouth daily.     HYDROcodone-acetaminophen (LORTAB) 7.5-500 MG per tablet Take 1 tablet by mouth every 6 (six) hours as needed.    insulin glargine (LANTUS) 100 UNIT/ML injection Inject 12 Units into the skin at bedtime.    !! insulin lispro (HUMALOG) 100 UNIT/ML injection Inject 6 Units into the skin 2 (two) times daily with breakfast and lunch.    !! insulin lispro (HUMALOG) 100 UNIT/ML injection Inject 8 Units into the skin daily after supper.    isosorbide mononitrate (IMDUR) 30 MG 24 hr tablet Take 30 mg by mouth daily.    LORazepam (ATIVAN) 0.5 MG tablet Take 0.5 mg  by mouth every 8 (eight) hours.    polyethylene glycol (MIRALAX / GLYCOLAX) packet Take 17 g by mouth daily.    potassium chloride SA (K-DUR,KLOR-CON) 20 MEQ tablet Take 20 mEq by mouth daily.    valsartan (DIOVAN) 160 MG tablet Take 160 mg by mouth 2 (two) times daily.      !! - Potential duplicate medications found. Please discuss with provider.        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 Cough was nonproductive of sputum. No associated chest pain or shortness of breath. In the ED, patient was found to be dehydrated and lung exam showed scattered rhonchi with rales mid and lower zones of the lungs bilaterally. Hematologic indices showed leukocytosis and chest x-ray showed infiltrate left lung.Flu PCR was-negative      Patient was admitted to telemetry.He was gently rehydrated with IV normal saline after treated for flu as well as pneumonia with Tamiflu Rocephin and Zithromax respectively. He was also given albuterol treatment as well as Atrovent. Fever was controlled with ibuprofen. Since patient is diabetic he was started insulin sliding scale. Other medication given to patient include imdur,benicar and amlodipine. Patient was also given aspirin as well as ferrous sulfate. GI prophylaxis was done with Protonix and DVT prophylaxis with Lovenox. Patient was also given KCl for low potassium. Patient was also given Mucinex as anti-tussive  Patient made remarkable progress. Hydration improved markedly. He denied any fever, chills or Rigors. Patient was seen by me this morning, feels better, denies any complaint, vital signs stable. Plan is for patient to be discharged home today.    Day of Discharge BP 160/70  Pulse 82  Temp(Src) 99.8 F (37.7 C) (Oral)  Resp 20  Ht 5\' 7"  (1.702 m)  Wt 85.9 kg (189 lb 6 oz)  BMI 29.66 kg/m2  SpO2 87%  Physical Exam: Vitals as  above. HEENT-pallor Neck-no JVD Chest- clear to auscultation CVS-S1 and S2 Abdomen-soft, nontender, no organomegaly, bowel sounds positive Extremities-trace edema MSS-Arthritic changes in the knees and feet.. Neuro- non-focal. Neuropsych-appropriate affect.  Results for orders placed during the hospital encounter of 05/26/11 (from the past 24 hour(s))  GLUCOSE, CAPILLARY     Status: Abnormal   Collection Time   05/27/11  5:06 PM      Component Value Range   Glucose-Capillary 156 (*) 70 - 99 (mg/dL)  GLUCOSE, CAPILLARY     Status: Abnormal   Collection Time   05/27/11 10:14 PM      Component Value Range   Glucose-Capillary 253 (*) 70 - 99 (mg/dL)  CBC     Status: Abnormal   Collection Time   05/28/11  5:20 AM      Component Value Range   WBC 12.4 (*) 4.0 - 10.5 (K/uL)   RBC 3.43 (*) 4.22 - 5.81 (MIL/uL)   Hemoglobin 10.7 (*) 13.0 - 17.0 (g/dL)   HCT 16.1 (*) 09.6 - 52.0 (%)   MCV 93.3  78.0 - 100.0 (fL)   MCH 31.2  26.0 - 34.0 (pg)   MCHC 33.4  30.0 - 36.0 (g/dL)   RDW 04.5  40.9 - 81.1 (%)   Platelets 215  150 - 400 (K/uL)  DIFFERENTIAL     Status: Abnormal   Collection Time   05/28/11  5:20 AM      Component Value Range   Neutrophils Relative 77  43 - 77 (%)   Neutro Abs 9.6 (*) 1.7 - 7.7 (K/uL)   Lymphocytes Relative 10 (*) 12 - 46 (%)   Lymphs Abs 1.3  0.7 - 4.0 (K/uL)   Monocytes Relative 11  3 - 12 (%)   Monocytes Absolute 1.3 (*) 0.1 - 1.0 (K/uL)   Eosinophils Relative 2  0 - 5 (%)   Eosinophils Absolute 0.2  0.0 - 0.7 (K/uL)   Basophils Relative 0  0 - 1 (%)   Basophils Absolute 0.0  0.0 - 0.1 (K/uL)  COMPREHENSIVE METABOLIC PANEL     Status: Abnormal   Collection Time   05/28/11  5:20 AM      Component Value Range   Sodium 135  135 - 145 (mEq/L)   Potassium 4.2  3.5 - 5.1 (mEq/L)   Chloride 97  96 - 112 (mEq/L)   CO2 26  19 - 32 (mEq/L)   Glucose, Bld 214 (*) 70 - 99 (mg/dL)   BUN 18  6 - 23 (mg/dL)   Creatinine, Ser 9.14  0.50 - 1.35 (mg/dL)   Calcium  9.0  8.4 - 10.5 (mg/dL)   Total Protein 7.1  6.0 - 8.3 (g/dL)   Albumin 2.9 (*) 3.5 - 5.2 (g/dL)   AST 24  0 - 37 (U/L)   ALT 11  0 - 53 (U/L)   Alkaline Phosphatase 70  39 - 117 (U/L)   Total Bilirubin 0.4  0.3 - 1.2 (mg/dL)   GFR  calc non Af Amer 54 (*) >90 (mL/min)   GFR calc Af Amer 63 (*) >90 (mL/min)  GLUCOSE, CAPILLARY     Status: Abnormal   Collection Time   05/28/11  7:38 AM      Component Value Range   Glucose-Capillary 232 (*) 70 - 99 (mg/dL)  GLUCOSE, CAPILLARY     Status: Abnormal   Collection Time   05/28/11 11:46 AM      Component Value Range   Glucose-Capillary 306 (*) 70 - 99 (mg/dL)    Disposition: stable   Follow-up Appts: Discharge Orders    Future Orders Please Complete By Expires   Diet - low sodium heart healthy      Increase activity slowly      Discharge instructions      Comments:   Follow up with pcp 1-2weeks        Signed: Janiyla Long 05/28/2011, 12:51 PM

## 2011-06-15 ENCOUNTER — Ambulatory Visit: Payer: Medicare Other | Admitting: Physical Therapy

## 2011-06-18 ENCOUNTER — Encounter: Payer: Medicare Other | Admitting: Physical Therapy

## 2011-07-02 ENCOUNTER — Ambulatory Visit: Payer: Medicare Other | Attending: Neurology | Admitting: Physical Therapy

## 2011-07-02 DIAGNOSIS — R269 Unspecified abnormalities of gait and mobility: Secondary | ICD-10-CM | POA: Insufficient documentation

## 2011-07-02 DIAGNOSIS — H8110 Benign paroxysmal vertigo, unspecified ear: Secondary | ICD-10-CM | POA: Insufficient documentation

## 2011-07-27 ENCOUNTER — Ambulatory Visit: Payer: Medicare Other | Attending: Neurology | Admitting: Physical Therapy

## 2011-07-27 DIAGNOSIS — R269 Unspecified abnormalities of gait and mobility: Secondary | ICD-10-CM | POA: Insufficient documentation

## 2011-07-27 DIAGNOSIS — H8110 Benign paroxysmal vertigo, unspecified ear: Secondary | ICD-10-CM | POA: Insufficient documentation

## 2011-08-06 ENCOUNTER — Ambulatory Visit: Payer: Medicare Other | Admitting: Physical Therapy

## 2011-08-14 ENCOUNTER — Ambulatory Visit: Payer: Medicare Other | Attending: Neurology | Admitting: Physical Therapy

## 2011-08-14 DIAGNOSIS — R269 Unspecified abnormalities of gait and mobility: Secondary | ICD-10-CM | POA: Insufficient documentation

## 2011-08-14 DIAGNOSIS — H8110 Benign paroxysmal vertigo, unspecified ear: Secondary | ICD-10-CM | POA: Insufficient documentation

## 2011-12-05 ENCOUNTER — Encounter: Payer: Self-pay | Admitting: Vascular Surgery

## 2012-02-09 ENCOUNTER — Inpatient Hospital Stay (HOSPITAL_COMMUNITY)
Admission: EM | Admit: 2012-02-09 | Discharge: 2012-02-11 | DRG: 291 | Disposition: A | Discharge status: Home / Self Care | Payer: Medicare Other | Attending: Internal Medicine | Admitting: Internal Medicine

## 2012-02-09 ENCOUNTER — Encounter (HOSPITAL_COMMUNITY): Payer: Self-pay | Admitting: *Deleted

## 2012-02-09 ENCOUNTER — Emergency Department (HOSPITAL_COMMUNITY): Payer: Medicare Other

## 2012-02-09 DIAGNOSIS — Z794 Long term (current) use of insulin: Secondary | ICD-10-CM

## 2012-02-09 DIAGNOSIS — Z96659 Presence of unspecified artificial knee joint: Secondary | ICD-10-CM

## 2012-02-09 DIAGNOSIS — I5033 Acute on chronic diastolic (congestive) heart failure: Principal | ICD-10-CM | POA: Diagnosis present

## 2012-02-09 DIAGNOSIS — Z79899 Other long term (current) drug therapy: Secondary | ICD-10-CM

## 2012-02-09 DIAGNOSIS — J189 Pneumonia, unspecified organism: Secondary | ICD-10-CM | POA: Diagnosis present

## 2012-02-09 DIAGNOSIS — I10 Essential (primary) hypertension: Secondary | ICD-10-CM | POA: Diagnosis present

## 2012-02-09 DIAGNOSIS — I2510 Atherosclerotic heart disease of native coronary artery without angina pectoris: Secondary | ICD-10-CM | POA: Diagnosis present

## 2012-02-09 DIAGNOSIS — E119 Type 2 diabetes mellitus without complications: Secondary | ICD-10-CM | POA: Diagnosis present

## 2012-02-09 DIAGNOSIS — J9600 Acute respiratory failure, unspecified whether with hypoxia or hypercapnia: Secondary | ICD-10-CM | POA: Diagnosis present

## 2012-02-09 DIAGNOSIS — E785 Hyperlipidemia, unspecified: Secondary | ICD-10-CM | POA: Diagnosis present

## 2012-02-09 DIAGNOSIS — R0902 Hypoxemia: Secondary | ICD-10-CM

## 2012-02-09 DIAGNOSIS — I509 Heart failure, unspecified: Secondary | ICD-10-CM | POA: Diagnosis present

## 2012-02-09 LAB — CBC WITH DIFFERENTIAL/PLATELET
Basophils Absolute: 0 10*3/uL (ref 0.0–0.1)
Basophils Relative: 0 % (ref 0–1)
Eosinophils Absolute: 0.3 10*3/uL (ref 0.0–0.7)
Lymphs Abs: 1.5 10*3/uL (ref 0.7–4.0)
MCH: 31.5 pg (ref 26.0–34.0)
Neutrophils Relative %: 65 % (ref 43–77)
Platelets: 227 10*3/uL (ref 150–400)
RBC: 4.06 MIL/uL — ABNORMAL LOW (ref 4.22–5.81)
WBC: 6.9 10*3/uL (ref 4.0–10.5)

## 2012-02-09 LAB — GLUCOSE, CAPILLARY
Glucose-Capillary: 164 mg/dL — ABNORMAL HIGH (ref 70–99)
Glucose-Capillary: 168 mg/dL — ABNORMAL HIGH (ref 70–99)
Glucose-Capillary: 174 mg/dL — ABNORMAL HIGH (ref 70–99)

## 2012-02-09 LAB — CBC
HCT: 38.1 % — ABNORMAL LOW (ref 39.0–52.0)
MCH: 31.1 pg (ref 26.0–34.0)
MCV: 92.7 fL (ref 78.0–100.0)
RBC: 4.11 MIL/uL — ABNORMAL LOW (ref 4.22–5.81)
RDW: 13.4 % (ref 11.5–15.5)
WBC: 6.3 10*3/uL (ref 4.0–10.5)

## 2012-02-09 LAB — TROPONIN I: Troponin I: <0.3 ng/mL (ref <0.30)

## 2012-02-09 LAB — TSH: TSH: 0.881 u[IU]/mL (ref 0.350–4.500)

## 2012-02-09 LAB — BASIC METABOLIC PANEL
BUN: 21 mg/dL (ref 6–23)
GFR calc Af Amer: 54 mL/min — ABNORMAL LOW (ref >90)
GFR calc non Af Amer: 47 mL/min — ABNORMAL LOW (ref >90)
Potassium: 3.9 mEq/L (ref 3.5–5.1)
Sodium: 139 mEq/L (ref 135–145)

## 2012-02-09 MED ORDER — ASPIRIN 81 MG PO CHEW
CHEWABLE_TABLET | ORAL | Status: AC
  Administered 2012-02-10: 162 mg
  Filled 2012-02-09: qty 2

## 2012-02-09 MED ORDER — SODIUM CHLORIDE 0.9 % IV SOLN
INTRAVENOUS | Status: AC
  Administered 2012-02-09: 10:00:00 via INTRAVENOUS

## 2012-02-09 MED ORDER — SODIUM CHLORIDE 0.9 % IJ SOLN
3.0000 mL | Freq: Two times a day (BID) | INTRAMUSCULAR | Status: DC
  Administered 2012-02-09 – 2012-02-10 (×4): 3 mL via INTRAVENOUS

## 2012-02-09 MED ORDER — POLYETHYL GLYCOL-PROPYL GLYCOL 0.4-0.3 % OP SOLN: 1.0000 [drp] | Freq: Every morning | OPHTHALMIC | Status: DC

## 2012-02-09 MED ORDER — FUROSEMIDE 80 MG PO TABS
80.0000 mg | ORAL_TABLET | ORAL | Status: DC
  Administered 2012-02-10: 80 mg via ORAL
  Filled 2012-02-09: qty 1

## 2012-02-09 MED ORDER — ENOXAPARIN SODIUM 30 MG/0.3ML ~~LOC~~ SOLN
30.0000 mg | SUBCUTANEOUS | Status: DC
  Administered 2012-02-09: 30 mg via SUBCUTANEOUS
  Filled 2012-02-09 (×2): qty 0.3

## 2012-02-09 MED ORDER — ASPIRIN 81 MG PO TABS
162.0000 mg | ORAL_TABLET | Freq: Two times a day (BID) | ORAL | Status: DC
  Administered 2012-02-09 – 2012-02-11 (×5): 162 mg via ORAL
  Filled 2012-02-09 (×6): qty 2

## 2012-02-09 MED ORDER — FUROSEMIDE 80 MG PO TABS
120.0000 mg | ORAL_TABLET | ORAL | Status: DC
  Administered 2012-02-11: 120 mg via ORAL
  Filled 2012-02-09: qty 1

## 2012-02-09 MED ORDER — FERROUS SULFATE 325 (65 FE) MG PO TABS
325.0000 mg | ORAL_TABLET | Freq: Every day | ORAL | Status: DC
  Administered 2012-02-10 – 2012-02-11 (×2): 325 mg via ORAL
  Filled 2012-02-09 (×3): qty 1

## 2012-02-09 MED ORDER — DEXTROSE 5 % IV SOLN
1.0000 g | Freq: Three times a day (TID) | INTRAVENOUS | Status: DC
  Filled 2012-02-09 (×2): qty 1

## 2012-02-09 MED ORDER — POLYVINYL ALCOHOL 1.4 % OP SOLN
1.0000 [drp] | Freq: Every day | OPHTHALMIC | Status: DC
  Administered 2012-02-09 – 2012-02-11 (×3): 1 [drp] via OPHTHALMIC
  Filled 2012-02-09: qty 15

## 2012-02-09 MED ORDER — SODIUM CHLORIDE 0.9 % IV SOLN: 250.0000 mL | INTRAVENOUS | Status: DC | PRN

## 2012-02-09 MED ORDER — VANCOMYCIN HCL IN DEXTROSE 1-5 GM/200ML-% IV SOLN
1000.0000 mg | Freq: Once | INTRAVENOUS | Status: AC
  Administered 2012-02-09: 1000 mg via INTRAVENOUS
  Filled 2012-02-09: qty 200

## 2012-02-09 MED ORDER — DEXTROSE 5 % IV SOLN
2.0000 g | INTRAVENOUS | Status: DC
  Administered 2012-02-09: 2 g via INTRAVENOUS
  Filled 2012-02-09 (×2): qty 2

## 2012-02-09 MED ORDER — ONDANSETRON HCL 4 MG/2ML IJ SOLN: 4.0000 mg | Freq: Four times a day (QID) | INTRAMUSCULAR | Status: DC | PRN

## 2012-02-09 MED ORDER — VANCOMYCIN HCL 1000 MG IV SOLR
1250.0000 mg | INTRAVENOUS | Status: DC
  Administered 2012-02-10: 1250 mg via INTRAVENOUS
  Filled 2012-02-09: qty 1250

## 2012-02-09 MED ORDER — ONDANSETRON HCL 4 MG PO TABS: 4.0000 mg | ORAL_TABLET | Freq: Four times a day (QID) | ORAL | Status: DC | PRN

## 2012-02-09 MED ORDER — PIPERACILLIN-TAZOBACTAM 3.375 G IVPB
3.3750 g | Freq: Once | INTRAVENOUS | Status: AC
  Administered 2012-02-09: 3.375 g via INTRAVENOUS
  Filled 2012-02-09: qty 50

## 2012-02-09 MED ORDER — IRBESARTAN 75 MG PO TABS
75.0000 mg | ORAL_TABLET | Freq: Every day | ORAL | Status: DC
  Administered 2012-02-09 – 2012-02-11 (×3): 75 mg via ORAL
  Filled 2012-02-09 (×3): qty 1

## 2012-02-09 MED ORDER — SODIUM CHLORIDE 0.9 % IJ SOLN: 3.0000 mL | INTRAMUSCULAR | Status: DC | PRN

## 2012-02-09 MED ORDER — LEVOFLOXACIN IN D5W 750 MG/150ML IV SOLN
750.0000 mg | INTRAVENOUS | Status: DC
  Administered 2012-02-09: 750 mg via INTRAVENOUS
  Filled 2012-02-09: qty 150

## 2012-02-09 MED ORDER — INSULIN GLARGINE 100 UNIT/ML ~~LOC~~ SOLN
12.0000 [IU] | Freq: Every day | SUBCUTANEOUS | Status: DC
  Administered 2012-02-09 – 2012-02-10 (×2): 12 [IU] via SUBCUTANEOUS

## 2012-02-09 MED ORDER — AMLODIPINE BESYLATE 10 MG PO TABS
10.0000 mg | ORAL_TABLET | Freq: Every day | ORAL | Status: DC
  Administered 2012-02-09 – 2012-02-11 (×3): 10 mg via ORAL
  Filled 2012-02-09 (×3): qty 1

## 2012-02-09 MED ORDER — PANTOPRAZOLE SODIUM 40 MG PO TBEC
40.0000 mg | DELAYED_RELEASE_TABLET | Freq: Every day | ORAL | Status: DC
  Administered 2012-02-09 – 2012-02-10 (×2): 40 mg via ORAL
  Filled 2012-02-09 (×3): qty 1

## 2012-02-09 MED ORDER — OMEPRAZOLE MAGNESIUM 20 MG PO TBEC: 20.0000 mg | DELAYED_RELEASE_TABLET | Freq: Every day | ORAL | Status: DC

## 2012-02-09 MED ORDER — INSULIN ASPART 100 UNIT/ML ~~LOC~~ SOLN
6.0000 [IU] | Freq: Two times a day (BID) | SUBCUTANEOUS | Status: DC
  Administered 2012-02-10 – 2012-02-11 (×3): 6 [IU] via SUBCUTANEOUS

## 2012-02-09 MED ORDER — SODIUM CHLORIDE 0.9 % IJ SOLN
3.0000 mL | Freq: Two times a day (BID) | INTRAMUSCULAR | Status: DC
  Administered 2012-02-09 – 2012-02-10 (×3): 3 mL via INTRAVENOUS

## 2012-02-09 MED ORDER — INSULIN ASPART 100 UNIT/ML ~~LOC~~ SOLN
8.0000 [IU] | Freq: Every day | SUBCUTANEOUS | Status: DC
  Administered 2012-02-09: 100 [IU] via SUBCUTANEOUS
  Administered 2012-02-10: 8 [IU] via SUBCUTANEOUS

## 2012-02-09 MED ORDER — POLYETHYLENE GLYCOL 3350 17 G PO PACK
17.0000 g | PACK | Freq: Every day | ORAL | Status: DC | PRN
  Filled 2012-02-09: qty 1

## 2012-02-09 MED ORDER — POTASSIUM CHLORIDE CRYS ER 20 MEQ PO TBCR
20.0000 meq | EXTENDED_RELEASE_TABLET | Freq: Every day | ORAL | Status: DC
  Administered 2012-02-09 – 2012-02-11 (×3): 20 meq via ORAL
  Filled 2012-02-09 (×3): qty 1

## 2012-02-09 MED ORDER — FUROSEMIDE 80 MG PO TABS
80.0000 mg | ORAL_TABLET | Freq: Every day | ORAL | Status: DC
  Filled 2012-02-09: qty 2

## 2012-02-09 MED ORDER — LEVOFLOXACIN IN D5W 750 MG/150ML IV SOLN
750.0000 mg | INTRAVENOUS | Status: DC
  Filled 2012-02-09: qty 150

## 2012-02-09 MED ORDER — DORZOLAMIDE HCL-TIMOLOL MAL 2-0.5 % OP SOLN
1.0000 [drp] | Freq: Every day | OPHTHALMIC | Status: DC
  Administered 2012-02-09 – 2012-02-11 (×3): 1 [drp] via OPHTHALMIC
  Filled 2012-02-09: qty 10

## 2012-02-09 MED ORDER — ISOSORBIDE MONONITRATE ER 30 MG PO TB24
30.0000 mg | ORAL_TABLET | Freq: Every day | ORAL | Status: DC
  Administered 2012-02-09 – 2012-02-11 (×3): 30 mg via ORAL
  Filled 2012-02-09 (×3): qty 1

## 2012-02-09 MED ORDER — HYDROCODONE-ACETAMINOPHEN 5-325 MG PO TABS: 1.0000 | ORAL_TABLET | ORAL | Status: DC | PRN

## 2012-02-09 NOTE — ED Notes (Signed)
Report has been called to [id number],  Patient and family aware.  No s/sx of distress at this time

## 2012-02-09 NOTE — ED Provider Notes (Addendum)
History     CSN: [id number]  Arrival date & time 02/09/12  0818   First MD Initiated Contact with Patient 02/09/12 [phone]      No chief complaint on file.   (Consider location/radiation/quality/duration/timing/severity/associated sxs/prior treatment) The history is provided by the patient.   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  Since around 2:00.  This morning.  He woke him from sleep.  He denies pain anywhere.  He has not had fevers, chills, sweating.  He has chronic left leg swelling.  Following a knee replacement on the left side.  His wife called EMS.  They gave him both Lasix, and sublingual nitroglycerin, and his symptoms have improved.  He denies change in his medications or diet recently.  He does not use oxygen at home.  Past Medical History  Diagnosis Date  . CHF (congestive heart failure)   . Diabetes mellitus   . Hypertension   . Hyperlipidemia     Past Surgical History  Procedure Date  . Lumbar laminectomy     No family history on file.  History  Substance Use Topics  . Smoking status: Never Smoker   . Smokeless tobacco: Not on file  . Alcohol Use:       Review of Systems  Constitutional: Negative for fever, chills and diaphoresis.  Respiratory: Positive for cough and shortness of breath. Negative for chest tightness.   Cardiovascular: Negative for chest pain and palpitations.  Gastrointestinal: Negative for nausea and vomiting.  All other systems reviewed and are negative.    Allergies  Review of patient's allergies indicates no known allergies.  Home Medications   Current Outpatient Rx  Name Route Sig Dispense Refill  . AMLODIPINE BESYLATE 10 MG PO TABS Oral Take 10 mg by mouth daily.    . ASPIRIN 81 MG PO TABS Oral Take 81 mg by mouth daily.     [name] FERROUS SULFATE 325 (65 FE) MG PO TABS Oral Take 325 mg by mouth  daily with breakfast.    . FUROSEMIDE 80 MG PO TABS Oral Take 80 mg by mouth daily.     . GUAIFENESIN ER 600 MG PO TB12 Oral Take 1 tablet (600 mg total) by mouth 2 (two) times daily. 30 tablet 0  . HYDROCODONE-ACETAMINOPHEN 7.5-500 MG PO TABS Oral Take 1 tablet by mouth every 6 (six) hours as needed.    . INSULIN GLARGINE 100 UNIT/ML [place] SOLN Subcutaneous Inject 12 Units into the skin at bedtime.    . INSULIN LISPRO (HUMAN) 100 UNIT/ML [place] SOLN Subcutaneous Inject 6 Units into the skin 2 (two) times daily with breakfast and lunch.    . INSULIN LISPRO (HUMAN) 100 UNIT/ML [place] SOLN Subcutaneous Inject 8 Units into the skin daily after supper.    . IPRATROPIUM BROMIDE HFA 17 MCG/ACT IN AERS Inhalation Inhale 2 puffs into the lungs every 6 (six) hours. 1 Inhaler 12  . ISOSORBIDE MONONITRATE ER 30 MG PO TB24 Oral Take 30 mg by mouth daily.    [name] LORAZEPAM 0.5 MG PO TABS Oral Take 0.5 mg by mouth every 8 (eight) hours.    [name] POLYETHYLENE GLYCOL 3350 PO PACK Oral Take 17 g by mouth daily.    [name] POTASSIUM CHLORIDE CRYS ER 20 MEQ PO TBCR Oral Take 20 mEq by mouth daily.    [name] VALSARTAN 160 MG PO TABS Oral Take 160 mg by mouth  2 (two) times daily.       BP 125/70  Pulse 78  Temp 97.9 F (36.6 C) (Oral)  Resp 22  SpO2 95%  Physical Exam  Nursing note and vitals reviewed. Constitutional: He is oriented to person, place, and time. He appears well-developed and well-nourished. No distress.       Speaking in full sentences without dyspnea  HENT:  Head: Normocephalic and atraumatic.  Eyes: EOM are normal.  Neck: Normal range of motion. Neck supple.  Cardiovascular: Normal rate, regular rhythm and intact distal pulses.   No murmur heard. Pulmonary/Chest: Effort normal. No respiratory distress. He has no wheezes. He has rales.       Rails and posterior bases, bilaterally  Abdominal: Soft. He exhibits no distension.  Musculoskeletal: Normal range of motion. He exhibits edema.       Left leg, swelling  without tenderness.  He states that this is chronic  Neurological: He is alert and oriented to person, place, and time. No cranial nerve deficit.  Skin: Skin is warm and dry.  Psychiatric: He has a normal mood and affect. Judgment and thought content normal.    ED Course  Procedures (including critical care time) 78 year old, male, with a history of congestive heart failure, presents with cough, and shortness of breath.  Since this morning.  After treatment with Lasix and nitroglycerin.  By the EMS crew.  He states that he feels better.  He has not had fevers, chills, or sweating.  We'll perform a chest x-ray, and laboratory testing, to try to distinguish between pneumonia or congestive heart failure.  He denied chest pain.  I doubt he is having an ACS.   Labs Reviewed  BASIC METABOLIC PANEL  PRO B NATRIURETIC PEPTIDE  CBC WITH DIFFERENTIAL   No results found.   No diagnosis found.  ECG Normal sinus rhythm at 78 beats per minute. Normal axis. First degree AV block. Normal.  ST and T waves. Impression sinus  with first degree AV block  9:32 AM [name] says he was recently txd with amox for a dental infx.    He asks if he can be treated as an outpt.   Since he had an initial low sat and recent tx with abxs, I explained that I think he should be admitted.  He understands and agrees.  9:55 AM  Spoke with Dr. Chestine Spore.  He informed me that [facility] does his admissions. I spoke with the [facility] md. He will admit.  ABXS started in ed.  MDM  Cough, with dyspnea HCAP- recent abx use  mild ra hypoxia.   no resp distress  no chf        Cheri Guppy, MD 02/09/12 0840  Cheri Guppy, MD 02/09/12 1610  Cheri Guppy, MD 02/09/12 9604  Cheri Guppy, MD 02/09/12 [phone]

## 2012-02-09 NOTE — ED Notes (Signed)
Pt's out put was 400cc.10:35am JG.

## 2012-02-09 NOTE — H&P (Signed)
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 He is unsure if he has had any fevers, but reports chills, productive cough of yellowish sputum. He denies any chest pain, no specific abdominal or urinary concerns, no recent similar events, no known sick contacts or exposures. He lives at home by himself. He reports recent knee replacement surgery.   Assessment and Plan:  Acute hypoxic respiratory failure - secondary to most likely PNA and given the fact he was recently hospitalized for knee replacement will initially cover as for HCAP - will monitor pt on telemetry floor and will provide supportive care - oxygen as needed - antitussives as needed - monitor oxygen saturations per floor protocol  CHF - currently on Lasix and will continue home medication regimen - will continue to monitor daily weight and strict I's and O's - BMP in AM  Diabetes type II - will continue home medication regimen - monitor CBG's per protocol and readjust the regimen as indicated   Code Status: Full Family Communication: Pt at bedside Disposition Plan: PT evaluation    Review of Systems:  Constitutional: Negative for fever, chills and malaise/fatigue. Negative for diaphoresis.  HENT: Negative for hearing loss, ear pain, nosebleeds, congestion, sore throat, neck pain, tinnitus and ear discharge.   Eyes: Negative for blurred vision, double vision, photophobia, pain, discharge and redness.  Respiratory: Positive for cough, sputum production, shortness of breath, negative for wheezing and stridor.   Cardiovascular: Negative for chest pain, palpitations, orthopnea,  claudication and leg swelling.  Gastrointestinal: Negative for nausea, vomiting and abdominal pain. Negative for heartburn, constipation, blood in stool and melena.  Genitourinary: Negative for dysuria, urgency, frequency, hematuria and flank pain.  Musculoskeletal: Negative for myalgias, back pain, joint pain and falls.  Skin: Negative for itching and rash.  Neurological: Negative for dizziness and weakness. Negative for tingling, tremors, sensory change, speech change, focal weakness, loss of consciousness and headaches.  Endo/Heme/Allergies: Negative for environmental allergies and polydipsia. Does not bruise/bleed easily.  Psychiatric/Behavioral: Negative for suicidal ideas. The patient is not nervous/anxious.      Past Medical History  Diagnosis Date  . CHF (congestive heart failure)   . Diabetes mellitus   . Hypertension   . Hyperlipidemia   . Coronary artery disease     Past Surgical History  Procedure Date  . Lumbar laminectomy   . Back surgery   . Total knee arthroplasty   . Quadriceps tendon repair   . Cataract extraction     Social History:  reports that he has never smoked. He does not have any smokeless tobacco history on file. He reports that he does not drink alcohol or use illicit drugs.  No Known Allergies  No family history of heart disease  Prior to Admission medications   Medication Sig Start Date End Date Taking? Authorizing Provider  amLODipine (NORVASC) 10 MG tablet Take 10 mg by mouth daily.   Yes Historical Provider, MD  aspirin 81 MG tablet Take 162 mg by mouth 2 (two) times daily.    Yes Historical Provider, MD  dorzolamide-timolol (COSOPT) 22.3-6.8 MG/ML ophthalmic solution Place 1 drop into both eyes daily.   Yes Historical Provider, MD  ferrous sulfate 325 (65 FE) MG tablet Take 325 mg by mouth daily with breakfast.   Yes Historical Provider, MD  furosemide (LASIX) 80 MG tablet Take 80-120 mg by mouth daily. 120 mg on Monday, Wednesday, and Friday  and 80 mg all other days   Yes Historical Provider, MD  HYDROcodone-acetaminophen (LORTAB) 7.5-500 MG per tablet Take 1 tablet by mouth 4 (four) times daily as needed. For pain   Yes Historical Provider, MD  insulin glargine (LANTUS) 100 UNIT/ML injection Inject 12 Units into the skin at bedtime.   Yes Historical Provider, MD  insulin lispro (HUMALOG) 100 UNIT/ML injection Inject 6 Units into the skin 2 (two) times daily with breakfast and lunch.   Yes Historical Provider, MD  insulin lispro (HUMALOG) 100 UNIT/ML injection Inject 8 Units into the skin daily after supper.   Yes Historical Provider, MD  isosorbide mononitrate (IMDUR) 30 MG 24 hr tablet Take 30 mg by mouth daily.   Yes Historical Provider, MD  omeprazole (PRILOSEC OTC) 20 MG tablet Take 20 mg by mouth daily.   Yes Historical Provider, MD  Polyethyl Glycol-Propyl Glycol (SYSTANE OP) Apply 1 drop to eye daily.   Yes Historical Provider, MD  polyethylene glycol (MIRALAX / GLYCOLAX) packet Take 17 g by mouth daily as needed. For constipation   Yes Historical Provider, MD  potassium chloride SA (K-DUR,KLOR-CON) 20 MEQ tablet Take 20 mEq by mouth daily.   Yes Historical Provider, MD  valsartan (DIOVAN) 160 MG tablet Take 160 mg by mouth 2 (two) times daily.    Yes Historical Provider, MD    Physical Exam: Filed Vitals:   02/09/12 0930 02/09/12 0945 02/09/12 1000 02/09/12 1035  BP: 152/77 142/81 133/63   Pulse: 72 80 76   Temp:    98.2 F (36.8 C)  TempSrc:    Oral  Resp: 16 18 13    Height:      Weight:      SpO2: 99% 99% 99%     Physical Exam  Constitutional: Appears well-developed and well-nourished. No distress.  HENT: Normocephalic. External right and left ear normal. Oropharynx is clear and moist.  Eyes: Conjunctivae and EOM are normal. PERRLA, no scleral icterus.  Neck: Normal ROM. Neck supple. No JVD. No tracheal deviation. No thyromegaly.  CVS: RRR, S1/S2 +, no murmurs, no gallops, no carotid bruit.  Pulmonary: Effort  and breath sounds normal, rales bilaterally Abdominal: Soft. BS +,  no distension, tenderness, rebound or guarding.  Musculoskeletal: Normal range of motion. No edema and no tenderness.  Lymphadenopathy: No lymphadenopathy noted, cervical, inguinal. Neuro: Alert. Normal reflexes, muscle tone coordination. No cranial nerve deficit. Skin: Skin is warm and dry. No rash noted. Not diaphoretic. No erythema. No pallor.  Psychiatric: Normal mood and affect. Behavior, judgment, thought content normal.   Labs on Admission:  Basic Metabolic Panel:  Lab 02/09/12 1191  NA 139  K 3.9  CL 100  CO2 30  GLUCOSE 197*  BUN 21  CREATININE 1.29  CALCIUM 9.5  MG --  PHOS --   Liver Function Tests: No results found for this basename: AST:5,ALT:5,ALKPHOS:5,BILITOT:5,PROT:5,ALBUMIN:5 in the last 168 hours No results found for this basename: LIPASE:5,AMYLASE:5 in the last 168 hours No results found for this basename: AMMONIA:5 in the last 168 hours CBC:  Lab 02/09/12 0842  WBC 6.9  NEUTROABS 4.5  HGB 12.8*  HCT 37.9*  MCV 93.3  PLT 227   Cardiac Enzymes: No results found for this basename: CKTOTAL:5,CKMB:5,CKMBINDEX:5,TROPONINI:5 in the last  168 hours BNP: No components found with this basename: POCBNP:5 CBG:  Lab 02/09/12 0923  GLUCAP 164*    Radiological Exams on Admission: Dg Chest Port 1 View  02/09/2012  *RADIOLOGY REPORT*  Clinical Data: CHF versus pneumonia  PORTABLE CHEST - 1 VIEW  Comparison: 05/26/2011  Findings: Patchy opacity in the medial right lower lung, suspicious for pneumonia.  Additional left basilar opacity, possibly atelectasis.  No frank interstitial edema.  No pleural effusion or pneumothorax.  Stable mild cardiomegaly.  Left subclavian pacemaker.  IMPRESSION: Patchy opacity in the medial right lower lung, suspicious for pneumonia.   Original Report Authenticated By: Charline Bills, M.D.     EKG: Normal sinus rhythm, no ST/T wave changes  Debbora Presto,  MD  [facility] Pager [phone]  If 7PM-7AM, please contact night-coverage [email or web address] Password [email or web address] 02/09/2012, 11:40 AM

## 2012-02-09 NOTE — ED Notes (Signed)
Patient reports he felt the need to clear his throat and then had sudden onset of sob,  Patient took his lasix 80mg .  EMs put patient on cpap and gave nitro x 2.  Patient arrives with decreased resp distress.  Patient is alert and oriented.  Denies pain.  He has decreased breath sounds,  Exp wheezing and crackles noted in the right lower lobe.  Patient has occassional cough, denies any production. RT and MD at bedside upon arrival

## 2012-02-09 NOTE — Progress Notes (Signed)
ANTIBIOTIC CONSULT NOTE - INITIAL  Pharmacy Consult for vancomycin (x 8 days), cefepime (x 8 days), and levaquin (x 3 days) Indication: rule out pneumonia  No Known Allergies  Patient Measurements: Height: 5\' 6"  (167.6 cm) Weight: 198 lb 13.7 oz (90.2 kg) IBW/kg (Calculated) : 63.8  Adjusted Body Weight: 90.2 kg  Vital Signs: Temp: 97.5 F (36.4 C) (09/28 1200) Temp src: Oral (09/28 1200) BP: 180/85 mmHg (09/28 1200) Pulse Rate: 73  (09/28 1200) Intake/Output from previous day:   Intake/Output from this shift:    Labs:  [facility] 02/09/12 0842  WBC 6.9  HGB 12.8*  PLT 227  LABCREA --  CREATININE 1.29   Estimated Creatinine Clearance: 39.3 ml/min (by C-G formula based on Cr of 1.29). No results found for this basename: VANCOTROUGH:2,VANCOPEAK:2,VANCORANDOM:2,GENTTROUGH:2,GENTPEAK:2,GENTRANDOM:2,TOBRATROUGH:2,TOBRAPEAK:2,TOBRARND:2,AMIKACINPEAK:2,AMIKACINTROU:2,AMIKACIN:2, in the last 72 hours   Microbiology: No results found for this or any previous visit (from the past 720 hour(s)).  Medical History: Past Medical History  Diagnosis Date  . CHF (congestive heart failure)   . Diabetes mellitus   . Hypertension   . Hyperlipidemia   . Coronary artery disease     Medications:  Scheduled:    . sodium chloride   Intravenous STAT  . amLODipine  10 mg Oral Daily  . aspirin  162 mg Oral BID  . ceFEPime (MAXIPIME) IV  1 g Intravenous Q8H  . dorzolamide-timolol  1 drop Both Eyes Daily  . enoxaparin (LOVENOX) injection  30 mg Subcutaneous Q24H  . ferrous sulfate  325 mg Oral Q breakfast  . insulin aspart  6 Units Subcutaneous BID WC  . insulin aspart  8 Units Subcutaneous QPC supper  . insulin glargine  12 Units Subcutaneous QHS  . irbesartan  75 mg Oral Daily  . isosorbide mononitrate  30 mg Oral Daily  . levofloxacin (LEVAQUIN) IV  750 mg Intravenous Q24H  . pantoprazole  40 mg Oral Q1200  . piperacillin-tazobactam (ZOSYN)  IV  3.375 g Intravenous Once  .  polyvinyl alcohol  1 drop Both Eyes Daily  . potassium chloride SA  20 mEq Oral Daily  . sodium chloride  3 mL Intravenous Q12H  . sodium chloride  3 mL Intravenous Q12H  . vancomycin  1,000 mg Intravenous Once  . DISCONTD: omeprazole  20 mg Oral Daily  . DISCONTD: Polyethyl Glycol-Propyl Glycol  1 drop Both Eyes q morning - 10a  . DISCONTD: Polyethyl Glycol-Propyl Glycol  1 drop Both Eyes q morning - 10a   Infusions:   Assessment: 78 yo [name] with suspected PNA will be started on vancomycin (x 8 days), cefepime (x 8 days), and levaquin (x 3 days).  Patient received vancomycin 1g iv x1 at 1109 and zosyn 3.375g at 1020 on 09/28. CrCl 39.3    Goal of Therapy:  Vancomycin trough level 15-20 mcg/ml  Plan:  1) Vancomycin 1250mg  iv q24h, 1st dose at 0800 on 02/10/12 x 7 days 2) Cefepime 2g iv q24h x 8 days and levaquin 750mg  iv every 48 hours x 2 doses  Darrell [name], Darrell [name] 02/09/2012,12:14 PM

## 2012-02-09 NOTE — ED Notes (Signed)
Gave old and new ECG to Dr. Nino Parsley after I completed.8:53 am JG.

## 2012-02-10 LAB — GLUCOSE, CAPILLARY
Glucose-Capillary: 256 mg/dL — ABNORMAL HIGH (ref 70–99)
Glucose-Capillary: 145 mg/dL — ABNORMAL HIGH (ref 70–99)

## 2012-02-10 LAB — TROPONIN I: Troponin I: <0.3 ng/mL (ref <0.30)

## 2012-02-10 LAB — BASIC METABOLIC PANEL
CO2: 31 mEq/L (ref 19–32)
Calcium: 9.3 mg/dL (ref 8.4–10.5)
Calcium: 9.5 mg/dL (ref 8.4–10.5)
Chloride: 100 mEq/L (ref 96–112)
GFR calc Af Amer: 49 mL/min — ABNORMAL LOW (ref >90)
GFR calc non Af Amer: 42 mL/min — ABNORMAL LOW (ref >90)
Glucose, Bld: 171 mg/dL — ABNORMAL HIGH (ref 70–99)
Glucose, Bld: 212 mg/dL — ABNORMAL HIGH (ref 70–99)
Sodium: 136 mEq/L (ref 135–145)
Sodium: 139 mEq/L (ref 135–145)

## 2012-02-10 LAB — CBC
HCT: 34.8 % — ABNORMAL LOW (ref 39.0–52.0)
MCH: 30.9 pg (ref 26.0–34.0)
MCHC: 33.6 g/dL (ref 30.0–36.0)
MCV: 93.3 fL (ref 78.0–100.0)
Platelets: 200 10*3/uL (ref 150–400)
Platelets: 206 10*3/uL (ref 150–400)
RBC: 3.62 MIL/uL — ABNORMAL LOW (ref 4.22–5.81)

## 2012-02-10 LAB — HEMOGLOBIN A1C
Hgb A1c MFr Bld: 8 % — ABNORMAL HIGH (ref <5.7)
Mean Plasma Glucose: 183 mg/dL — ABNORMAL HIGH (ref <117)

## 2012-02-10 MED ORDER — ENOXAPARIN SODIUM 40 MG/0.4ML ~~LOC~~ SOLN
40.0000 mg | SUBCUTANEOUS | Status: DC
  Administered 2012-02-10: 40 mg via SUBCUTANEOUS
  Filled 2012-02-10 (×3): qty 0.4

## 2012-02-10 MED ORDER — LEVOFLOXACIN 750 MG PO TABS
750.0000 mg | ORAL_TABLET | Freq: Every day | ORAL | Status: DC
  Administered 2012-02-10 – 2012-02-11 (×2): 750 mg via ORAL
  Filled 2012-02-10 (×3): qty 1

## 2012-02-10 NOTE — Progress Notes (Signed)
Patient ID: [name], male   DOB: 07-Jan-1919, 78 y.o.   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 He is unsure if he has had any fevers, but reports chills, productive cough of yellowish sputum. He denies any chest pain, no specific abdominal or urinary concerns, no recent similar events, no known sick contacts or exposures. He lives at home by himself. He reports recent knee replacement surgery.   Assessment and Plan:  Acute hypoxic respiratory failure  - secondary to most likely PNA and given the fact he was recently hospitalized for knee replacement will initially cover as for HCAP  - will monitor pt on telemetry floor and will provide supportive care  - oxygen as needed  - antitussives as needed  - monitor oxygen saturations per floor protocol  - [name] is clinically improving and currently maintaining oxygen saturations at goal - narrow down the antibiotic coverage  CHF  - currently on Lasix and will continue home medication regimen  - will continue to monitor daily weight and strict I's and O's  - BMP in AM   Diabetes type II  - will continue home medication regimen  - monitor CBG's per protocol and readjust the regimen as indicated   Consultants:  None  Procedures/Studies: Dg Chest Port 1 View  02/09/2012  *RADIOLOGY REPORT*  Clinical Data: CHF versus pneumonia  PORTABLE CHEST - 1 VIEW  Comparison: 05/26/2011  Findings: Patchy opacity in the medial right lower lung, suspicious for pneumonia.  Additional left basilar opacity, possibly atelectasis.  No frank interstitial edema.  No pleural effusion or pneumothorax.  Stable mild cardiomegaly.  Left subclavian pacemaker.   IMPRESSION: Patchy opacity in the medial right lower lung, suspicious for pneumonia.   Original Report Authenticated By: Charline Bills, M.D.     Antibiotics:  Vancomycin 09/28 -->  Maxipime 09/28 -->  Levaquin 09/28 -->  Code Status: Full Family Communication: Pt at bedside Disposition Plan: Home when medically stable  HPI/Subjective: No events overnight.   Objective: Filed Vitals:   02/09/12 1430 02/09/12 2033 02/10/12 0500 02/10/12 1011  BP: 155/68 139/66 131/70 140/58  Pulse: 74 74 73   Temp: 97.7 F (36.5 C) 97.9 F (36.6 C) 98.2 F (36.8 C)   TempSrc: Oral Oral Oral   Resp: 18 18 18    Height:      Weight:   92.08 kg (203 lb)   SpO2: 100% 98% 100%     Intake/Output Summary (Last 24 hours) at 02/10/12 1216 Last data filed at 02/10/12 0911  Gross per 24 hour  Intake   1400 ml  Output   1400 ml  Net      0 ml    Exam:   General:  Pt is alert, follows commands appropriately, not in acute distress  Cardiovascular: Regular rate and rhythm, S1/S2, no murmurs, no rubs, no gallops  Respiratory: Clear to auscultation bilaterally, no wheezing, no crackles, no rhonchi  Abdomen: Soft, non tender, non distended, bowel sounds present, no guarding  Extremities: Trace bilateral pitting edema, pulses DP and PT palpable bilaterally  Neuro: Grossly nonfocal  Data Reviewed: Basic Metabolic Panel:  Lab 02/10/12 9562 02/09/12 2346 02/09/12 1405 02/09/12 0842  NA 139 136 --  139  K 4.1 4.7 -- 3.9  CL 100 100 -- 100  CO2 31 29 -- 30  GLUCOSE 212* 171* -- 197*  BUN 20 22 -- 21  CREATININE 1.35 1.41* -- 1.29  CALCIUM 9.5 9.3 -- 9.5  MG -- -- 2.0 --  PHOS -- -- 2.9 --   Liver Function Tests: No results found for this basename: AST:5,ALT:5,ALKPHOS:5,BILITOT:5,PROT:5,ALBUMIN:5 in the last 168 hours No results found for this basename: LIPASE:5,AMYLASE:5 in the last 168 hours No results found for this basename: AMMONIA:5 in the last 168 hours CBC:  Lab 02/10/12  0858 02/09/12 2346 02/09/12 1405 02/09/12 0842  WBC 6.3 5.2 6.3 6.9  NEUTROABS -- -- -- 4.5  HGB 11.7* 11.2* 12.8* 12.8*  HCT 34.8* 33.3* 38.1* 37.9*  MCV 93.3 92.0 92.7 93.3  PLT 206 200 244 227   Cardiac Enzymes:  Lab 02/09/12 2346 02/09/12 1719 02/09/12 1405  CKTOTAL -- -- --  CKMB -- -- --  CKMBINDEX -- -- --  TROPONINI <0.30 <0.30 <0.30   BNP: No components found with this basename: POCBNP:5 CBG:  Lab 02/10/12 1050 02/10/12 0636 02/09/12 2137 02/09/12 1147 02/09/12 0923  GLUCAP 256* 136* 174* 168* 164*    Recent Results (from the past 240 hour(s))  CULTURE, BLOOD (ROUTINE X 2)     Status: Normal (Preliminary result)   Collection Time   02/09/12  1:56 PM      Component Value Range Status Comment   Specimen Description BLOOD RIGHT FOREARM   Final    Special Requests BOTTLES DRAWN AEROBIC ONLY 8CC   Final    Culture  Setup Time 02/09/2012 17:28   Final    Culture     Final    Value:        BLOOD CULTURE RECEIVED NO GROWTH TO DATE CULTURE WILL BE HELD FOR 5 DAYS BEFORE ISSUING A FINAL NEGATIVE REPORT   Report Status PENDING   Incomplete   CULTURE, BLOOD (ROUTINE X 2)     Status: Normal (Preliminary result)   Collection Time   02/09/12  1:59 PM      Component Value Range Status Comment   Specimen Description BLOOD LEFT WRIST   Final    Special Requests BOTTLES DRAWN AEROBIC ONLY 8CC   Final    Culture  Setup Time 02/09/2012 17:28   Final    Culture     Final    Value:        BLOOD CULTURE RECEIVED NO GROWTH TO DATE CULTURE WILL BE HELD FOR 5 DAYS BEFORE ISSUING A FINAL NEGATIVE REPORT   Report Status PENDING   Incomplete      Scheduled Meds:   . sodium chloride   Intravenous STAT  . amLODipine  10 mg Oral Daily  . aspirin      . aspirin  162 mg Oral BID  . ceFEPime (MAXIPIME) IV  2 g Intravenous Q24H  . dorzolamide-timolol  1 drop Both Eyes Daily  . enoxaparin (LOVENOX) injection  40 mg Subcutaneous Q24H  . ferrous sulfate  325 mg Oral Q breakfast  . furosemide   120 mg Oral Q M,W,F  . furosemide  80 mg Oral Q T,Th,S,Su  . insulin aspart  6 Units Subcutaneous BID WC  . insulin aspart  8 Units Subcutaneous QPC supper  . insulin glargine  12 Units Subcutaneous QHS  . irbesartan  75 mg Oral Daily  . isosorbide mononitrate  30 mg Oral Daily  . levofloxacin (LEVAQUIN) IV  750 mg Intravenous Q48H  . pantoprazole  40 mg Oral Q1200  . piperacillin-tazobactam (ZOSYN)  IV  3.375 g Intravenous Once  . polyvinyl alcohol  1 drop Both Eyes Daily  . potassium chloride SA  20 mEq Oral Daily  . sodium chloride  3 mL Intravenous Q12H  . sodium chloride  3 mL Intravenous Q12H  . vancomycin  1,250 mg Intravenous Q24H  . DISCONTD: ceFEPime (MAXIPIME) IV  1 g Intravenous Q8H  . DISCONTD: enoxaparin (LOVENOX) injection  30 mg Subcutaneous Q24H  . DISCONTD: furosemide  80-120 mg Oral Daily  . DISCONTD: levofloxacin (LEVAQUIN) IV  750 mg Intravenous Q24H   Continuous Infusions:    Debbora Presto, MD  [facility] Pager [phone]  If 7PM-7AM, please contact night-coverage [email or web address] Password [email or web address] 02/10/2012, 12:16 PM   LOS: 1 day

## 2012-02-10 NOTE — Evaluation (Signed)
Physical Therapy Evaluation Patient Details Name: [name] MRN: [id number] DOB: 1918/08/20 Today's Date: 02/10/2012 Time: 4098-1191 PT Time Calculation (min): 26 min  PT Assessment / Plan / Recommendation Clinical Impression  Patient is a 78 yo male admitted with HCAP.  Patient is at baseline mod I level with mobility/gait.  No acute PT needs identified - PT will sign off.  Patient in agreement.  Encouraged ambulation in hallway with nursing.    PT Assessment  Patent does not need any further PT services    Follow Up Recommendations  No PT follow up;Supervision - Intermittent    Barriers to Discharge        Equipment Recommendations  None recommended by PT    Recommendations for Other Services     Frequency      Precautions / Restrictions Precautions Precautions: None Restrictions Weight Bearing Restrictions: No         Mobility  Bed Mobility Bed Mobility: Supine to Sit;Sitting - Scoot to Edge of Bed Supine to Sit: 7: Independent;HOB flat Sitting - Scoot to Edge of Bed: 7: Independent Details for Bed Mobility Assistance: No cues or assist needed Transfers Transfers: Sit to Stand;Stand to Sit Sit to Stand: 6: Modified independent (Device/Increase time);With upper extremity assist;From bed;From toilet Stand to Sit: 6: Modified independent (Device/Increase time);With upper extremity assist;To toilet;To bed Details for Transfer Assistance: Cues for hand placement.  No physical assist needed Ambulation/Gait Ambulation/Gait Assistance: 6: Modified independent (Device/Increase time) Ambulation Distance (Feet): 220 Feet Assistive device: Rolling walker Ambulation/Gait Assistance Details: Patient uses RW safely.  Cues to try to stand up tall.  Good balance and good gait speed. Gait Pattern: Within Functional Limits Gait velocity: WFL     PT Goals  N/A  Visit Information  Last PT Received On: 02/10/12 Assistance Needed: +1    Subjective Data  Subjective: "I try  to stay active" Patient Stated Goal: To return home soon   Prior Functioning  Home Living Lives With: Alone Available Help at Discharge: Family;Personal care attendant;Available 24 hours/day (Aide will stay at night if pt is requiring increased assist) Type of Home: House Home Access: Stairs to enter [employer] of Steps: 3 Entrance Stairs-Rails: Right;Left Home Layout: Multi-level;Able to live on main level with bedroom/bathroom Bathroom Shower/Tub: Engineer, manufacturing systems: Standard Bathroom Accessibility: Yes How Accessible: Accessible via walker Home Adaptive Equipment: Bedside commode/3-in-1;Shower chair with back;Straight cane;Walker - rolling Prior Function Level of Independence: Independent with assistive device(s);Needs assistance Needs Assistance: Bathing;Meal Prep;Light Housekeeping Bath: Minimal Meal Prep: Maximal Light Housekeeping: Total Able to Take Stairs?: Yes (with assistance) Driving: Yes 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) Communication Communication: HOH (Wears hearing aids)    Cognition  Overall Cognitive Status: Appears within functional limits for tasks assessed/performed Arousal/Alertness: Awake/alert Orientation Level: Oriented X4 / Intact Behavior During Session: WFL for tasks performed    Extremity/Trunk Assessment Right Upper Extremity Assessment RUE ROM/Strength/Tone: WFL for tasks assessed RUE Sensation: WFL - Light Touch Left Upper Extremity Assessment LUE ROM/Strength/Tone: WFL for tasks assessed LUE Sensation: WFL - Light Touch Right Lower Extremity Assessment RLE ROM/Strength/Tone: Deficits RLE ROM/Strength/Tone Deficits: Strength 4/5 RLE Sensation: History of peripheral neuropathy Left Lower Extremity Assessment LLE ROM/Strength/Tone: Deficits LLE ROM/Strength/Tone Deficits: Strength 4/5 LLE Sensation: History of peripheral neuropathy   Balance    End of Session PT - End of Session Equipment  Utilized During Treatment: Gait belt Activity Tolerance: Patient tolerated treatment well Patient left: in bed;with call bell/phone within reach;with family/visitor present (  sitting on EOB) Nurse Communication: Mobility status (Encouraged ambulation in hallway with nursing)  GP     Vena Austria 02/10/2012, 6:03 PM Durenda Hurt. Renaldo Fiddler, [facility] Acute Rehab Services Pager [phone]

## 2012-02-11 DIAGNOSIS — R0902 Hypoxemia: Secondary | ICD-10-CM

## 2012-02-11 DIAGNOSIS — J189 Pneumonia, unspecified organism: Secondary | ICD-10-CM

## 2012-02-11 LAB — CBC
HCT: 33.6 % — ABNORMAL LOW (ref 39.0–52.0)
Hemoglobin: 11.6 g/dL — ABNORMAL LOW (ref 13.0–17.0)
RBC: 3.58 MIL/uL — ABNORMAL LOW (ref 4.22–5.81)
WBC: 4.9 10*3/uL (ref 4.0–10.5)

## 2012-02-11 LAB — BASIC METABOLIC PANEL
BUN: 21 mg/dL (ref 6–23)
CO2: 29 mEq/L (ref 19–32)
Chloride: 101 mEq/L (ref 96–112)
GFR calc non Af Amer: 43 mL/min — ABNORMAL LOW (ref >90)
Glucose, Bld: 93 mg/dL (ref 70–99)
Potassium: 3.5 mEq/L (ref 3.5–5.1)
Sodium: 138 mEq/L (ref 135–145)

## 2012-02-11 LAB — GLUCOSE, CAPILLARY: Glucose-Capillary: 92 mg/dL (ref 70–99)

## 2012-02-11 MED ORDER — HYDROCODONE-ACETAMINOPHEN 7.5-500 MG PO TABS: 1.0000 | ORAL_TABLET | Freq: Four times a day (QID) | ORAL | Status: DC | PRN

## 2012-02-11 MED ORDER — LEVOFLOXACIN 750 MG PO TABS: 750.0000 mg | ORAL_TABLET | Freq: Every day | ORAL | Status: DC

## 2012-02-11 NOTE — Progress Notes (Signed)
Patient's IV and telemetry has been discontinued; patient and caretaker verbalizes understanding of discharge instructions.  Lorretta Harp RN

## 2012-02-11 NOTE — Discharge Summary (Signed)
Physician Discharge Summary  [name] ION:[id number] DOB: 1918-06-10 DOA: 02/09/2012  PCP: [name], MD  Admit date: 02/09/2012 Discharge date: 02/11/2012  Recommendations for Outpatient Follow-up:  1. Pt will need to follow up with PCP in 2-3 weeks post discharge 2. Please obtain BMP to evaluate electrolytes and kidney function 3. Please also check CBC to evaluate Hg and Hct levels 4. Please note that Dr. 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 He is unsure if he has had any fevers, but reports chills, productive cough of yellowish sputum. He denies any chest pain, no specific abdominal or urinary concerns, no recent similar events, no known sick contacts or exposures. He lives at home by himself. He reports recent knee replacement surgery.   Assessment and Plan:  Acute hypoxic respiratory failure  - secondary to most likely PNA and given the fact he was recently hospitalized for knee replacement will initially cover as for HCAP in addition to CHF exacerbation - will monitor pt on telemetry floor and will provide supportive care  - oxygen as needed  - antitussives as needed  - monitored oxygen saturations per floor protocol  - Dr. [name] is clinically improving and currently maintaining oxygen saturations at goal   CHF, diastolic  - currently on Lasix and will continue home medication regimen  - continued to monitor daily weight and strict I's and O's  - please note that weight on admission was 210 and upon discharge was 202 lbs - diuresis was  tolerated well by Dr. [name]  Diabetes type II  - will continue home medication regimen  - this has remained stable during the hospital stay - please note that A1C was ~8 and was checked during this hospital stay  Consultants:  None  Procedures/Studies:   Dg Chest Port 1 View 02/09/2012  MPRESSION:  Patchy opacity in the medial right lower lung, suspicious for pneumonia.   Antibiotics:  Vancomycin 09/28 --> 09/30 Maxipime 09/28 --> 09/30 Levaquin 09/28 -->   Discharge Exam Filed Vitals:   02/11/12 0958  BP: 171/75  Pulse: 82  Temp: 97.5 F (36.4 C)  Resp: 18   Filed Vitals:   02/10/12 1426 02/10/12 2147 02/11/12 0449 02/11/12 0958  BP: 124/63 137/65 151/72 171/75  Pulse: 70 70 74 82  Temp: 97.7 F (36.5 C) 97.7 F (36.5 C) 97.5 F (36.4 C) 97.5 F (36.4 C)  TempSrc: Oral Oral Oral Oral  Resp: 19 20 19 18   Height:      Weight:   91.989 kg (202 lb 12.8 oz)   SpO2: 100% 99% 98% 94%    General: Pt is alert, follows commands appropriately, not in acute distress Cardiovascular: Regular rate and rhythm, S1/S2 +, no murmurs, no rubs, no gallops Respiratory: Clear to auscultation bilaterally, no wheezing, no crackles, no rhonchi Abdominal: Soft, non tender, non distended, bowel sounds +, no guarding Extremities: no edema, no cyanosis, pulses palpable bilaterally DP and PT Neuro: Grossly nonfocal  Discharge Instructions  Discharge Orders    Future Orders Please Complete By Expires   Diet - low sodium heart healthy  Increase activity slowly          Medication List     As of 02/11/2012 10:01 AM    TAKE these medications         amLODipine 10 MG tablet   Commonly known as: NORVASC   Take 10 mg by mouth daily.      aspirin 81 MG tablet   Take 162 mg by mouth 2 (two) times daily.      dorzolamide-timolol 22.3-6.8 MG/ML ophthalmic solution   Commonly known as: COSOPT   Place 1 drop into both eyes daily.      ferrous sulfate 325 (65 FE) MG tablet    Take 325 mg by mouth daily with breakfast.      furosemide 80 MG tablet   Commonly known as: LASIX   Take 80-120 mg by mouth daily. 120 mg on Monday, Wednesday, and Friday and 80 mg all other days      HYDROcodone-acetaminophen 7.5-500 MG per tablet   Commonly known as: LORTAB   Take 1 tablet by mouth 4 (four) times daily as needed. For pain      insulin glargine 100 UNIT/ML injection   Commonly known as: LANTUS   Inject 12 Units into the skin at bedtime.      insulin lispro 100 UNIT/ML injection   Commonly known as: HUMALOG   Inject 6 Units into the skin 2 (two) times daily with breakfast and lunch.      insulin lispro 100 UNIT/ML injection   Commonly known as: HUMALOG   Inject 8 Units into the skin daily after supper.      isosorbide mononitrate 30 MG 24 hr tablet   Commonly known as: IMDUR   Take 30 mg by mouth daily.      levofloxacin 750 MG tablet   Commonly known as: LEVAQUIN   Take 1 tablet (750 mg total) by mouth daily.      omeprazole 20 MG tablet   Commonly known as: PRILOSEC OTC   Take 20 mg by mouth daily.      polyethylene glycol packet   Commonly known as: MIRALAX / GLYCOLAX   Take 17 g by mouth daily as needed. For constipation      potassium chloride SA 20 MEQ tablet   Commonly known as: K-DUR,KLOR-CON   Take 20 mEq by mouth daily.      SYSTANE OP   Apply 1 drop to eye daily.      valsartan 160 MG tablet   Commonly known as: DIOVAN   Take 160 mg by mouth 2 (two) times daily.           Follow-up Information    Follow up with [name], MD. In 2 weeks.   Contact information:   [address], [id number] [place] [address] [phone]       Follow up with Lesleigh Noe, MD. In 2 weeks.   Contact information:   [address] [place] [address] [phone]           The results of significant diagnostics from this hospitalization (including imaging, microbiology,  ancillary and laboratory) are listed below for reference.     Microbiology: Recent Results (from the past 240 hour(s))  CULTURE, BLOOD (ROUTINE X 2)     Status: Normal (Preliminary result)   Collection Time   02/09/12  1:56 PM      Component Value Range Status Comment   Specimen Description BLOOD  RIGHT FOREARM   Final    Special Requests BOTTLES DRAWN AEROBIC ONLY 8CC   Final    Culture  Setup Time 02/09/2012 17:28   Final    Culture     Final    Value:        BLOOD CULTURE RECEIVED NO GROWTH TO DATE CULTURE WILL BE HELD FOR 5 DAYS BEFORE ISSUING A FINAL NEGATIVE REPORT   Report Status PENDING   Incomplete   CULTURE, BLOOD (ROUTINE X 2)     Status: Normal (Preliminary result)   Collection Time   02/09/12  1:59 PM      Component Value Range Status Comment   Specimen Description BLOOD LEFT WRIST   Final    Special Requests BOTTLES DRAWN AEROBIC ONLY 8CC   Final    Culture  Setup Time 02/09/2012 17:28   Final    Culture     Final    Value:        BLOOD CULTURE RECEIVED NO GROWTH TO DATE CULTURE WILL BE HELD FOR 5 DAYS BEFORE ISSUING A FINAL NEGATIVE REPORT   Report Status PENDING   Incomplete      Labs: Basic Metabolic Panel:  Lab 02/11/12 1610 02/10/12 0858 02/09/12 2346 02/09/12 1405 02/09/12 0842  NA 138 139 136 -- 139  K 3.5 4.1 4.7 -- 3.9  CL 101 100 100 -- 100  CO2 29 31 29  -- 30  GLUCOSE 93 212* 171* -- 197*  BUN 21 20 22  -- 21  CREATININE 1.38* 1.35 1.41* -- 1.29  CALCIUM 9.3 9.5 9.3 -- 9.5  MG -- -- -- 2.0 --  PHOS -- -- -- 2.9 --   CBC:  Lab 02/11/12 0635 02/10/12 0858 02/09/12 2346 02/09/12 1405 02/09/12 0842  WBC 4.9 6.3 5.2 6.3 6.9  NEUTROABS -- -- -- -- 4.5  HGB 11.6* 11.7* 11.2* 12.8* 12.8*  HCT 33.6* 34.8* 33.3* 38.1* 37.9*  MCV 93.9 93.3 92.0 92.7 93.3  PLT 175 206 200 244 227   Cardiac Enzymes:  Lab 02/09/12 2346 02/09/12 1719 02/09/12 1405  CKTOTAL -- -- --  CKMB -- -- --  CKMBINDEX -- -- --  TROPONINI <0.30 <0.30 <0.30   BNP: BNP (last 3  results)  Basename 02/09/12 1405 02/09/12 0843 05/26/11 0840  PROBNP 2155.0* 1547.0* 2522.0*   CBG:  Lab 02/11/12 0614 02/10/12 2142 02/10/12 1608 02/10/12 1050 02/10/12 0636  GLUCAP 92 136* 145* 256* 136*     SIGNED: Time coordinating discharge: Over 30 minutes  Debbora Presto, MD  [facility] 02/11/2012, 10:01 AM Pager [phone]  If 7PM-7AM, please contact night-coverage [email or web address] Password [email or web address]

## 2012-02-11 NOTE — Progress Notes (Signed)
Utilization review completed.  

## 2012-02-15 LAB — CULTURE, BLOOD (ROUTINE X 2): Culture: NO GROWTH

## 2012-02-29 ENCOUNTER — Emergency Department (HOSPITAL_COMMUNITY): Payer: Medicare Other

## 2012-02-29 ENCOUNTER — Encounter (HOSPITAL_COMMUNITY): Payer: Self-pay | Admitting: *Deleted

## 2012-02-29 ENCOUNTER — Inpatient Hospital Stay (HOSPITAL_COMMUNITY)
Admission: EM | Admit: 2012-02-29 | Discharge: 2012-03-01 | DRG: 291 | Disposition: A | Discharge status: Home / Self Care | Payer: Medicare Other | Attending: Internal Medicine | Admitting: Internal Medicine

## 2012-02-29 DIAGNOSIS — Z8701 Personal history of pneumonia (recurrent): Secondary | ICD-10-CM

## 2012-02-29 DIAGNOSIS — E785 Hyperlipidemia, unspecified: Secondary | ICD-10-CM | POA: Diagnosis present

## 2012-02-29 DIAGNOSIS — Z794 Long term (current) use of insulin: Secondary | ICD-10-CM

## 2012-02-29 DIAGNOSIS — Z950 Presence of cardiac pacemaker: Secondary | ICD-10-CM

## 2012-02-29 DIAGNOSIS — J9600 Acute respiratory failure, unspecified whether with hypoxia or hypercapnia: Secondary | ICD-10-CM

## 2012-02-29 DIAGNOSIS — I5032 Chronic diastolic (congestive) heart failure: Secondary | ICD-10-CM

## 2012-02-29 DIAGNOSIS — J811 Chronic pulmonary edema: Secondary | ICD-10-CM

## 2012-02-29 DIAGNOSIS — I10 Essential (primary) hypertension: Secondary | ICD-10-CM

## 2012-02-29 DIAGNOSIS — Z79899 Other long term (current) drug therapy: Secondary | ICD-10-CM

## 2012-02-29 DIAGNOSIS — E1142 Type 2 diabetes mellitus with diabetic polyneuropathy: Secondary | ICD-10-CM | POA: Diagnosis present

## 2012-02-29 DIAGNOSIS — I5033 Acute on chronic diastolic (congestive) heart failure: Principal | ICD-10-CM | POA: Diagnosis present

## 2012-02-29 DIAGNOSIS — J189 Pneumonia, unspecified organism: Secondary | ICD-10-CM

## 2012-02-29 DIAGNOSIS — R0602 Shortness of breath: Secondary | ICD-10-CM

## 2012-02-29 DIAGNOSIS — Z7982 Long term (current) use of aspirin: Secondary | ICD-10-CM

## 2012-02-29 DIAGNOSIS — Z96659 Presence of unspecified artificial knee joint: Secondary | ICD-10-CM

## 2012-02-29 DIAGNOSIS — IMO0001 Reserved for inherently not codable concepts without codable children: Secondary | ICD-10-CM | POA: Diagnosis present

## 2012-02-29 DIAGNOSIS — N179 Acute kidney failure, unspecified: Secondary | ICD-10-CM

## 2012-02-29 DIAGNOSIS — R0603 Acute respiratory distress: Secondary | ICD-10-CM

## 2012-02-29 DIAGNOSIS — E119 Type 2 diabetes mellitus without complications: Secondary | ICD-10-CM

## 2012-02-29 DIAGNOSIS — Z888 Allergy status to other drugs, medicaments and biological substances status: Secondary | ICD-10-CM

## 2012-02-29 DIAGNOSIS — I509 Heart failure, unspecified: Secondary | ICD-10-CM | POA: Diagnosis present

## 2012-02-29 DIAGNOSIS — I2510 Atherosclerotic heart disease of native coronary artery without angina pectoris: Secondary | ICD-10-CM | POA: Diagnosis present

## 2012-02-29 DIAGNOSIS — I119 Hypertensive heart disease without heart failure: Secondary | ICD-10-CM | POA: Diagnosis present

## 2012-02-29 HISTORY — DX: Type 2 diabetes mellitus without complications: E11.9

## 2012-02-29 LAB — BASIC METABOLIC PANEL
BUN: 24 mg/dL — ABNORMAL HIGH (ref 6–23)
CO2: 25 mEq/L (ref 19–32)
Calcium: 9.2 mg/dL (ref 8.4–10.5)
Chloride: 101 mEq/L (ref 96–112)
Creatinine, Ser: 1.36 mg/dL — ABNORMAL HIGH (ref 0.50–1.35)

## 2012-02-29 LAB — URINALYSIS, ROUTINE W REFLEX MICROSCOPIC
Glucose, UA: NEGATIVE mg/dL
Hgb urine dipstick: NEGATIVE
Ketones, ur: NEGATIVE mg/dL
Protein, ur: NEGATIVE mg/dL
Urobilinogen, UA: 0.2 mg/dL (ref 0.0–1.0)

## 2012-02-29 LAB — CBC
HCT: 36.7 % — ABNORMAL LOW (ref 39.0–52.0)
MCH: 30.9 pg (ref 26.0–34.0)
MCV: 93.6 fL (ref 78.0–100.0)
RBC: 3.92 MIL/uL — ABNORMAL LOW (ref 4.22–5.81)
RDW: 14 % (ref 11.5–15.5)
WBC: 5.9 10*3/uL (ref 4.0–10.5)

## 2012-02-29 LAB — POCT I-STAT 3, VENOUS BLOOD GAS (G3P V)
Bicarbonate: 29.3 mEq/L — ABNORMAL HIGH (ref 20.0–24.0)
TCO2: 31 mmol/L (ref 0–100)
pH, Ven: 7.339 — ABNORMAL HIGH (ref 7.250–7.300)
pO2, Ven: 40 mmHg (ref 30.0–45.0)

## 2012-02-29 LAB — PROTIME-INR: INR: 1 (ref 0.00–1.49)

## 2012-02-29 LAB — POCT I-STAT TROPONIN I

## 2012-02-29 LAB — GLUCOSE, CAPILLARY
Glucose-Capillary: 172 mg/dL — ABNORMAL HIGH (ref 70–99)
Glucose-Capillary: 123 mg/dL — ABNORMAL HIGH (ref 70–99)

## 2012-02-29 MED ORDER — INSULIN ASPART 100 UNIT/ML ~~LOC~~ SOLN: 0.0000 [IU] | Freq: Three times a day (TID) | SUBCUTANEOUS | Status: DC

## 2012-02-29 MED ORDER — ASPIRIN 81 MG PO TABS: 162.0000 mg | ORAL_TABLET | Freq: Two times a day (BID) | ORAL | Status: DC

## 2012-02-29 MED ORDER — INSULIN GLARGINE 100 UNIT/ML ~~LOC~~ SOLN
12.0000 [IU] | Freq: Every day | SUBCUTANEOUS | Status: DC
  Administered 2012-02-29: 12 [IU] via SUBCUTANEOUS

## 2012-02-29 MED ORDER — ALBUTEROL SULFATE (5 MG/ML) 0.5% IN NEBU: 2.5000 mg | INHALATION_SOLUTION | RESPIRATORY_TRACT | Status: DC | PRN

## 2012-02-29 MED ORDER — ACETAMINOPHEN 325 MG PO TABS: 650.0000 mg | ORAL_TABLET | Freq: Four times a day (QID) | ORAL | Status: DC | PRN

## 2012-02-29 MED ORDER — POLYETHYLENE GLYCOL 3350 17 G PO PACK
17.0000 g | PACK | Freq: Every day | ORAL | Status: DC | PRN
  Filled 2012-02-29: qty 1

## 2012-02-29 MED ORDER — SODIUM CHLORIDE 0.9 % IV SOLN
INTRAVENOUS | Status: DC
  Administered 2012-02-29: 20 mL/h via INTRAVENOUS

## 2012-02-29 MED ORDER — OMEPRAZOLE MAGNESIUM 20 MG PO TBEC: 20.0000 mg | DELAYED_RELEASE_TABLET | Freq: Every day | ORAL | Status: DC

## 2012-02-29 MED ORDER — PANTOPRAZOLE SODIUM 40 MG PO TBEC
40.0000 mg | DELAYED_RELEASE_TABLET | Freq: Every day | ORAL | Status: DC
  Administered 2012-02-29 – 2012-03-01 (×2): 40 mg via ORAL
  Filled 2012-02-29: qty 1

## 2012-02-29 MED ORDER — ONDANSETRON HCL 4 MG/2ML IJ SOLN: 4.0000 mg | Freq: Four times a day (QID) | INTRAMUSCULAR | Status: DC | PRN

## 2012-02-29 MED ORDER — ONDANSETRON HCL 4 MG PO TABS: 4.0000 mg | ORAL_TABLET | Freq: Four times a day (QID) | ORAL | Status: DC | PRN

## 2012-02-29 MED ORDER — ASPIRIN EC 81 MG PO TBEC
162.0000 mg | DELAYED_RELEASE_TABLET | Freq: Two times a day (BID) | ORAL | Status: DC
  Administered 2012-02-29 – 2012-03-01 (×3): 162 mg via ORAL
  Filled 2012-02-29 (×4): qty 2

## 2012-02-29 MED ORDER — FERROUS SULFATE 325 (65 FE) MG PO TABS
325.0000 mg | ORAL_TABLET | Freq: Every day | ORAL | Status: DC
  Administered 2012-02-29 – 2012-03-01 (×2): 325 mg via ORAL
  Filled 2012-02-29 (×3): qty 1

## 2012-02-29 MED ORDER — PIPERACILLIN-TAZOBACTAM IN DEX 2-0.25 GM/50ML IV SOLN
2.2500 g | Freq: Once | INTRAVENOUS | Status: AC
  Administered 2012-02-29: 2.25 g via INTRAVENOUS
  Filled 2012-02-29 (×2): qty 50

## 2012-02-29 MED ORDER — HEPARIN SODIUM (PORCINE) 5000 UNIT/ML IJ SOLN
5000.0000 [IU] | Freq: Three times a day (TID) | INTRAMUSCULAR | Status: DC
  Administered 2012-02-29 – 2012-03-01 (×3): 5000 [IU] via SUBCUTANEOUS
  Filled 2012-02-29 (×6): qty 1

## 2012-02-29 MED ORDER — ASPIRIN 325 MG PO TABS
325.0000 mg | ORAL_TABLET | ORAL | Status: AC
  Administered 2012-02-29: 325 mg via ORAL
  Filled 2012-02-29: qty 1

## 2012-02-29 MED ORDER — VANCOMYCIN HCL IN DEXTROSE 1-5 GM/200ML-% IV SOLN
1000.0000 mg | Freq: Once | INTRAVENOUS | Status: AC
  Administered 2012-02-29: 1000 mg via INTRAVENOUS
  Filled 2012-02-29: qty 200

## 2012-02-29 MED ORDER — HYDROCODONE-ACETAMINOPHEN 5-325 MG PO TABS: 1.0000 | ORAL_TABLET | Freq: Four times a day (QID) | ORAL | Status: DC | PRN

## 2012-02-29 MED ORDER — SODIUM CHLORIDE 0.9 % IJ SOLN
3.0000 mL | Freq: Two times a day (BID) | INTRAMUSCULAR | Status: DC
  Administered 2012-02-29 – 2012-03-01 (×2): 3 mL via INTRAVENOUS

## 2012-02-29 MED ORDER — DORZOLAMIDE HCL-TIMOLOL MAL 2-0.5 % OP SOLN
1.0000 [drp] | Freq: Every day | OPHTHALMIC | Status: DC
  Filled 2012-02-29: qty 10

## 2012-02-29 MED ORDER — GUAIFENESIN ER 600 MG PO TB12
1200.0000 mg | ORAL_TABLET | Freq: Two times a day (BID) | ORAL | Status: DC
  Administered 2012-02-29 – 2012-03-01 (×3): 1200 mg via ORAL
  Filled 2012-02-29 (×4): qty 2

## 2012-02-29 MED ORDER — FUROSEMIDE 10 MG/ML IJ SOLN
80.0000 mg | Freq: Once | INTRAMUSCULAR | Status: AC
  Administered 2012-02-29: 80 mg via INTRAVENOUS
  Filled 2012-02-29: qty 4

## 2012-02-29 MED ORDER — GUAIFENESIN-DM 100-10 MG/5ML PO SYRP: 5.0000 mL | ORAL_SOLUTION | ORAL | Status: DC | PRN

## 2012-02-29 MED ORDER — INSULIN ASPART 100 UNIT/ML ~~LOC~~ SOLN
0.0000 [IU] | Freq: Three times a day (TID) | SUBCUTANEOUS | Status: DC
  Administered 2012-02-29: 11 [IU] via SUBCUTANEOUS
  Administered 2012-03-01: 5 [IU] via SUBCUTANEOUS

## 2012-02-29 MED ORDER — ACETAMINOPHEN 650 MG RE SUPP: 650.0000 mg | Freq: Four times a day (QID) | RECTAL | Status: DC | PRN

## 2012-02-29 MED ORDER — ISOSORBIDE MONONITRATE ER 30 MG PO TB24
30.0000 mg | ORAL_TABLET | Freq: Every day | ORAL | Status: DC
  Administered 2012-02-29 – 2012-03-01 (×2): 30 mg via ORAL
  Filled 2012-02-29 (×2): qty 1

## 2012-02-29 MED ORDER — AMLODIPINE BESYLATE 10 MG PO TABS
10.0000 mg | ORAL_TABLET | Freq: Every day | ORAL | Status: DC
  Administered 2012-02-29 – 2012-03-01 (×2): 10 mg via ORAL
  Filled 2012-02-29 (×2): qty 1

## 2012-02-29 MED ORDER — NITROGLYCERIN 0.4 MG SL SUBL: 0.4000 mg | SUBLINGUAL_TABLET | SUBLINGUAL | Status: DC | PRN

## 2012-02-29 NOTE — H&P (Addendum)
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 He does have history of insulin-dependent diabetes mellitus, hypertension and diastolic CHF. He was recently treated for pneumonia. He came in to the hospital because of shortness of breath. [name] said he was doing fine until this morning when he woke up about 4 AM and when he was walking to the bathroom he felt short of breath, he took extra 80 mg of Lasix and got back to bed. He was still feeling short of breath so he called 911. He said he sleeps in 2 pillows for long time, he noticed increased swelling in his legs, denies fever, chills, palpitations or chest pain. He'll be admitted to the hospital for further evaluation.   Review of Systems:  Constitutional: negative for anorexia, fevers and sweats Eyes: negative for irritation, redness and visual disturbance Ears, nose, mouth, throat, and face: negative for earaches, epistaxis, nasal congestion and sore throat Respiratory: SOB per history of present illness Cardiovascular: negative for chest pain, dyspnea,  orthopnea, palpitations and syncope, has lower extremity edema Gastrointestinal: negative for abdominal pain, constipation, diarrhea, melena, nausea and vomiting Genitourinary:negative for dysuria, frequency and hematuria Hematologic/lymphatic: negative for bleeding, easy bruising and lymphadenopathy Musculoskeletal:negative for arthralgias, muscle weakness and stiff joints Neurological: negative for coordination problems, gait problems, headaches and weakness Endocrine: negative for diabetic symptoms including polydipsia, polyuria and weight loss Allergic/Immunologic: negative for anaphylaxis, hay fever and  urticaria   Past Medical History  Diagnosis Date  . CHF (congestive heart failure)   . Diabetes mellitus   . Hypertension   . Hyperlipidemia   . Coronary artery disease    Past Surgical History  Procedure Date  . Lumbar laminectomy   . Back surgery   . Total knee arthroplasty   . Quadriceps tendon repair   . Cataract extraction   . Insert / replace / remove pacemaker    Social History:  reports that he has never smoked. He has never used smokeless tobacco. He reports that he does not drink alcohol or use illicit drugs.   Allergies  Allergen Reactions  . Statins Other (See Comments)    unknown    Family History  Problem Relation Age of Onset  . Hypertension Father      Prior to Admission medications   Medication Sig Start Date End Date Taking? Authorizing Provider  amLODipine (NORVASC) 10 MG tablet Take 10 mg by mouth daily.   Yes Historical Provider, MD  aspirin 81 MG tablet Take 162 mg by mouth 2 (two) times daily.    Yes Historical Provider, MD  dorzolamide-timolol (COSOPT) 22.3-6.8 MG/ML ophthalmic solution Place 1 drop into both eyes daily.   Yes Historical Provider, MD  ferrous sulfate 325 (65 FE) MG tablet Take 325 mg by mouth daily with breakfast.   Yes Historical Provider, MD  furosemide (LASIX) 80 MG tablet Take 80-120 mg by mouth daily. 120 mg on Monday, Wednesday, and Friday and 80 mg all other days   Yes Historical Provider, MD  HYDROcodone-acetaminophen (LORTAB) 7.5-500 MG per tablet Take 1 tablet by mouth 4 (four) times daily as needed. For pain 02/11/12  Yes Dorothea Ogle, MD  insulin glargine (LANTUS) 100 UNIT/ML injection Inject 12 Units into the skin at bedtime.  Yes Historical Provider, MD  insulin lispro (HUMALOG) 100 UNIT/ML injection Inject 6 Units into the skin 2 (two) times daily with breakfast and lunch.   Yes Historical Provider, MD  insulin lispro (HUMALOG) 100 UNIT/ML injection Inject 8 Units into the skin daily after supper.   Yes Historical  Provider, MD  isosorbide mononitrate (IMDUR) 30 MG 24 hr tablet Take 30 mg by mouth daily.   Yes Historical Provider, MD  levofloxacin (LEVAQUIN) 750 MG tablet Take 1 tablet (750 mg total) by mouth daily. 02/11/12  Yes Dorothea Ogle, MD  omeprazole (PRILOSEC OTC) 20 MG tablet Take 20 mg by mouth daily.   Yes Historical Provider, MD  Polyethyl Glycol-Propyl Glycol (SYSTANE OP) Apply 1 drop to eye daily.   Yes Historical Provider, MD  polyethylene glycol (MIRALAX / GLYCOLAX) packet Take 17 g by mouth daily as needed. For constipation   Yes Historical Provider, MD  potassium chloride SA (K-DUR,KLOR-CON) 20 MEQ tablet Take 20 mEq by mouth daily.   Yes Historical Provider, MD  valsartan (DIOVAN) 160 MG tablet Take 160 mg by mouth 2 (two) times daily.    Yes Historical Provider, MD   Physical Exam: Filed Vitals:   02/29/12 0715 02/29/12 0730 02/29/12 0745 02/29/12 0800  BP: 129/67 126/83 151/78 128/57  Pulse: 73 75 70 71  Resp: 16 20 21 17   SpO2: 96% 97% 92% 97%   General appearance: alert, cooperative and no distress  Head: Normocephalic, without obvious abnormality, atraumatic  Eyes: conjunctivae/corneas clear. PERRL, EOM's intact. Fundi benign.  Nose: Nares normal. Septum midline. Mucosa normal. No drainage or sinus tenderness.  Throat: lips, mucosa, and tongue normal; teeth and gums normal  Neck: Supple, no masses, no cervical lymphadenopathy, no JVD appreciated, no meningeal signs Resp: Fine crackles bilaterally Chest wall: no tenderness  Cardio: regular rate and rhythm, S1, S2 normal, no murmur, click, rub or gallop  GI: soft, non-tender; bowel sounds normal; no masses, no organomegaly  Extremities: extremities normal, atraumatic, no cyanosis. There is +1 edema in the RLE., +2 edema in the LLE. Skin: Skin color, texture, turgor normal. No rashes or lesions  Neurologic: Alert and oriented X 3, normal strength and tone. Normal symmetric reflexes. Normal coordination and gait  Labs on  Admission:  Basic Metabolic Panel:  Lab 02/29/12 1610  NA 136  K 3.9  CL 101  CO2 25  GLUCOSE 168*  BUN 24*  CREATININE 1.36*  CALCIUM 9.2  MG --  PHOS --   Liver Function Tests: No results found for this basename: AST:5,ALT:5,ALKPHOS:5,BILITOT:5,PROT:5,ALBUMIN:5 in the last 168 hours No results found for this basename: LIPASE:5,AMYLASE:5 in the last 168 hours No results found for this basename: AMMONIA:5 in the last 168 hours CBC:  Lab 02/29/12 0537  WBC 5.9  NEUTROABS --  HGB 12.1*  HCT 36.7*  MCV 93.6  PLT 198   Cardiac Enzymes:  Lab 02/29/12 0538  CKTOTAL --  CKMB --  CKMBINDEX --  TROPONINI <0.30    BNP (last 3 results)  Basename 02/29/12 0538 02/09/12 1405 02/09/12 0843  PROBNP 1277.0* 2155.0* 1547.0*   CBG: No results found for this basename: GLUCAP:5 in the last 168 hours  Radiological Exams on Admission: Dg Chest Port 1 View  02/29/2012  *RADIOLOGY REPORT*  Clinical Data: Shortness of breath and respiratory distress.  PORTABLE CHEST - 1 VIEW  Comparison: 02/09/2012  Findings: Shallow inspiration.  Mild cardiac enlargement and pulmonary vascular congestion.  Perihilar infiltrates are increasing since previous study may represent  edema or pneumonia. Obscuration of the left hemidiaphragm suggest small left pleural effusion.  No pneumothorax.  Calcification of the aorta.  IMPRESSION: Cardiac enlargement with mild pulmonary vascular congestion. Bilateral perihilar infiltrates are increasing and may represent pneumonia or edema.  Small left pleural effusion.   Original Report Authenticated By: Marlon Pel, M.D.     EKG: Independently reviewed.   Assessment/Plan Active Problems:  Chronic diastolic CHF (congestive heart failure)  SOB (shortness of breath)  Hypertension  Diabetes mellitus  Acute renal failure  Acute respiratory failure -With hypoxia and oxygen saturation in the 70s on room air when patient presented. -Patient was placed on BiPAP  for brief period of time, he is on 2 L of oxygen now sats above 97%. -This is likely secondary to the acute diastolic CHF.  Acute diastolic CHF -He presented with shortness of breath, slight elevation of BNP and lower extremity edema. -Chest x-ray showed vascular congestion, his oxygen saturation went down to the 80s on room air. -He will probably need diuresis, even with his increasing creatinine. Continue Imdur. -Meanwhile heart healthy diet with sodium restriction and 1800 mg/day fluid restriction. -I will call his cardiologist Dr. Garnette Scheuermann for help. I will follow his recommendation regarding diuresis.  Acute renal failure -His BUN/creatinine are 24/1.3 with ratio of less than 20, this is could be secondary to nephrotoxic medications. -Are well controlled his Diovan, Lasix. Cardiology to advise. -Check BMP in the morning. Creatinine baseline was 1.2 in September of 2013, this is slight elevation.  Shortness of breath -This is likely secondary to the acute diastolic CHF, and was concern about unresolved pneumonia. -Patient denies any fever/chills, no leukocytosis. I will hold on antibiotics for now. -The x-ray findings likely from the recently treated pneumonia. -If he spikes fever with the current x-ray I will consider that pneumonia.  Diabetes mellitus type 2 -Uncontrolled diabetes mellitus type 2 with hemoglobin A1c of 8.0. -Patient is on Lantus I will continue, insulin sliding scale also will be utilized. -Carbohydrate modified diet.  Hypertension -Continue Norvasc, Imdur. Hold Diovan because of increasing creatinine.  Code Status: Full Family Communication: Discussed with the patient Disposition Plan: Admit to inpatient telemetry  Time spent: 70 minutes  [facility] Pager [phone]  If 7PM-7AM, please contact night-coverage [email or web address] Password [email or web address] 02/29/2012, 8:19 AM

## 2012-02-29 NOTE — ED Provider Notes (Signed)
History     CSN: [id number]  Arrival date & time 02/29/12  4098   First MD Initiated Contact with Patient 02/29/12 0535      Chief Complaint  Patient presents with  . 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  He presents today via EMS with complaints of shortness of breath. He felt well when he went to sleep last night. However, he did notice that he had some lower extremity edema which is unusual for him. He awoke at 4 AM with an urge to urinate. He ambulated to the restroom and felt short of breath aching short trip from his bed to the bathroom. He took an extra 80 mg of Lasix by mouth. However, when he got up to use the bathroom again at 5 AM, his shortness of breath with even more pronounced. Thus, he called 911.  The patient says he feels like he is experiencing an exacerbation of his heart failure. He reports compliance with all medications. He denies any recent dose changes to any medications. He recently completed a course of Levaquin 750 mg for treatment of community-acquired pneumonia. He was discharged from this hospital on September 30 after a three-day admission for respiratory distress/failure.  The patient denies experiencing chest pain. He has had a lingering but mild productive cough. This is improving. He denies chills and fever. He says his by mouth intake has been normal.  Paramedics report that the patient had oxygen sats of 60% on room air at the scene. The patient started on CPAP prior to arrival in the emergency department. With the patient's caregiver who notes that the patient was able to speak complete sentences when he left the house.  The patient says that he is feeling better now. He was examined  by me while on BiPAP. The patient's caregiver says that he looks much better.  Past Medical History  Diagnosis Date  . CHF (congestive heart failure)   . Diabetes mellitus   . Hypertension   . Hyperlipidemia   . Coronary artery disease     Past Surgical History  Procedure Date  . Lumbar laminectomy   . Back surgery   . Total knee arthroplasty   . Quadriceps tendon repair   . Cataract extraction   . Insert / replace / remove pacemaker     No family history on file.  History  Substance Use Topics  . Smoking status: Never Smoker   . Smokeless tobacco: Never Used  . Alcohol Use: No      Review of Systems  Gen: no weight loss, fevers, chills, night sweats Eyes: no discharge or drainage, no occular pain or visual changes Nose: no epistaxis or rhinorrhea Mouth: no dental pain, no sore throat Neck: no neck pain Lungs: As per history of present illness, otherwise negative CV: As per history of present illness, otherwise negative Abd: no abdominal pain, nausea, vomiting GU: no dysuria or gross hematuria MSK: no myalgias or arthralgias Neuro: no headache, no focal neurologic deficits Skin: no rash Psyche: negative.  Allergies  Review of patient's allergies indicates no known allergies.  Home Medications   Current Outpatient Rx  Name Route Sig Dispense Refill  . AMLODIPINE BESYLATE 10 MG PO TABS Oral Take 10 mg by mouth daily.    . ASPIRIN 81 MG PO TABS Oral Take 162 mg  by mouth 2 (two) times daily.     . DORZOLAMIDE HCL-TIMOLOL MAL 22.3-6.8 MG/ML OP SOLN Both Eyes Place 1 drop into both eyes daily.    [name] FERROUS SULFATE 325 (65 FE) MG PO TABS Oral Take 325 mg by mouth daily with breakfast.    . FUROSEMIDE 80 MG PO TABS Oral Take 80-120 mg by mouth daily. 120 mg on Monday, Wednesday, and Friday and 80 mg all other days    . HYDROCODONE-ACETAMINOPHEN 7.5-500 MG PO TABS Oral Take 1 tablet by mouth 4 (four) times daily as needed. For pain 65 tablet 0  . INSULIN GLARGINE  100 UNIT/ML [place] SOLN Subcutaneous Inject 12 Units into the skin at bedtime.    . INSULIN LISPRO (HUMAN) 100 UNIT/ML [place] SOLN Subcutaneous Inject 6 Units into the skin 2 (two) times daily with breakfast and lunch.    . INSULIN LISPRO (HUMAN) 100 UNIT/ML [place] SOLN Subcutaneous Inject 8 Units into the skin daily after supper.    . ISOSORBIDE MONONITRATE ER 30 MG PO TB24 Oral Take 30 mg by mouth daily.    [name] LEVOFLOXACIN 750 MG PO TABS Oral Take 1 tablet (750 mg total) by mouth daily. 7 tablet 0  . OMEPRAZOLE MAGNESIUM 20 MG PO TBEC Oral Take 20 mg by mouth daily.    Frazier Butt OP Ophthalmic Apply 1 drop to eye daily.    [name] POLYETHYLENE GLYCOL 3350 PO PACK Oral Take 17 g by mouth daily as needed. For constipation    . POTASSIUM CHLORIDE CRYS ER 20 MEQ PO TBCR Oral Take 20 mEq by mouth daily.    [name] VALSARTAN 160 MG PO TABS Oral Take 160 mg by mouth 2 (two) times daily.       BP 128/70  Pulse 70  Resp 16  SpO2 100%  Physical Exam  Gen: well developed and well nourished appearing, very pleasant, tachypneic.  Head: NCAT Eyes: PERL, EOMI Nose: no epistaixis or rhinorrhea Mouth/throat: mucosa is moist and pink Neck: supple, no stridor, no jvd appreciated.  Lungs: RR 28 to 32/min, sats 100% on Fi02 60% with BiPAP of 12/8. Mild bibasilar rales, no wheezing or rhonchi appreciated, bilateral lower leg pitting edema (left > right) which is chronic, per patient.  Abd: soft, obese, notender, nondistended Back: no ttp, no cva ttp Skin: no rashese, wnl Neuro: CN ii-xii grossly intact, no focal deficits Psyche; normal affect,  calm and cooperative.   ED Course  Procedures (including critical care time)  Results for orders placed during the hospital encounter of 02/29/12 (from the past 24 hour(s))  CBC     Status: Abnormal   Collection Time   02/29/12  5:37 AM      Component Value Range   WBC 5.9  4.0 - 10.5 K/uL   RBC 3.92 (*) 4.22 - 5.81 MIL/uL   Hemoglobin 12.1 (*) 13.0 - 17.0 g/dL   HCT 46.9 (*)  62.9 - 52.0 %   MCV 93.6  78.0 - 100.0 fL   MCH 30.9  26.0 - 34.0 pg   MCHC 33.0  30.0 - 36.0 g/dL   RDW 52.8  41.3 - 24.4 %   Platelets 198  150 - 400 K/uL  PROTIME-INR     Status: Normal   Collection Time   02/29/12  5:37 AM      Component Value Range   Prothrombin Time 13.1  11.6 - 15.2 seconds   INR 1.00  0.00 - 1.49  POCT I-STAT TROPONIN I  Status: Normal   Collection Time   02/29/12  6:06 AM      Component Value Range   Troponin i, poc 0.06  0.00 - 0.08 ng/mL   Comment 3           POCT I-STAT 3, BLOOD GAS (G3P V)     Status: Abnormal   Collection Time   02/29/12  6:09 AM      Component Value Range   pH, Ven 7.339 (*) 7.250 - 7.300   pCO2, Ven 54.5 (*) 45.0 - 50.0 mmHg   pO2, Ven 40.0  30.0 - 45.0 mmHg   Bicarbonate 29.3 (*) 20.0 - 24.0 mEq/L   TCO2 31  0 - 100 mmol/L   O2 Saturation 70.0     Acid-Base Excess 2.0  0.0 - 2.0 mmol/L   Sample type VENOUS     Comment NOTIFIED PHYSICIAN      EKG: nsr, no acute ischemic changes, normal intervals, normal axis, normal qrs complex  Chest x-ray: bibasilar infiltrates (right > left), cardiomegaly. Pulmonary infiltrates v. Edema.   DDX: respiratory distress:  HCAP, pulmonary edema, ACS, PTX, pleural or pericardial effusion. PE less likely as patient reports that his sx are identical to previous excacerbations of heart failure with pulmonary edema.   The patient treated himself with an extra 80mg  po Lasix PTA.  He had some good UOP between 4a and 5a but, none here. He appears to be in some degree of pulmonary edema based on CXR. We will tx with another 80mg  IV as patient's normal AM dose is 120mg  Lasix.  Cardiac markers pending. No signs of acute ischemia PTA. We will tx with ASA. We will cover with Zosyn and Vancomycin for suspected HCAP based on assymetric infiltrates on CXR. Blood cx will be drawn first.  We are attempting to wean from BiPAP.   Hospitalist paged to request step down admission.   CRITICAL CARE Performed by:  Brandt Loosen   Total critical care time: 40  Critical care time was exclusive of separately billable procedures and treating other patients.  Critical care was necessary to treat or prevent imminent or life-threatening deterioration.  Critical care was time spent personally by me on the following activities: development of treatment plan with patient and/or surrogate as well as nursing, discussions with consultants, evaluation of patient's response to treatment, examination of patient, obtaining history from patient or surrogate, ordering and performing treatments and interventions, ordering and review of laboratory studies, ordering and review of radiographic studies, pulse oximetry and re-evaluation of patient's condition.   PROCEDURE: 18 G Angiocath placed in left EJ by me without complications. Indication - need for peripheral access.     MDM  Please see above note.        Brandt Loosen, MD 02/29/12 [id number]

## 2012-02-29 NOTE — ED Notes (Signed)
RA sats 60% on EMS arrival.  Cold, diaphoretic, tachypneic.  Rales in all fields, NO c/o CP.  TX with NRB then CPAP.  GIven NTG.4 time 1   Cpap O2 sats    91 %.  AFib for EMS 80-160.  IV attempt unsudccessful by EMS.  BLE edematous

## 2012-02-29 NOTE — ED Notes (Signed)
EKG completed and given to Dr. Manly along with OLD ekg. 

## 2012-02-29 NOTE — ED Notes (Signed)
Remains on Bipap at 40%, comfortable.  Denies CP ,  Monitor atrial paced 77.

## 2012-02-29 NOTE — Consult Note (Signed)
Admit date: 02/29/2012 Referring Physician  Dr. Arthor Captain  Primary Cardiologist  Dr. Verdis Prime III Reason for Consultation  Shortness of breath  HPI: 78 y/o who has had diastolic dysfunction in the past.  Most recent echo showed LVH with EF 50-55%.  He presented with [facility] starting this morning. Over the past few days, he has had some LE edema.  He denies using excess salt and takes his Lasix regularly.  He was hospitalized a few weeks ago with pneumonia.  He received some Lasix in the ER and now feels much better.  He does not report any chest pain.          PMH:   Past Medical History  Diagnosis Date  . CHF (congestive heart failure)   . Diabetes mellitus   . Hypertension   . Hyperlipidemia   . Coronary artery disease      PSH:   Past Surgical History  Procedure Date  . Lumbar laminectomy   . Back surgery   . Total knee arthroplasty   . Quadriceps tendon repair   . Cataract extraction   . Insert / replace / remove pacemaker     Allergies:  Statins Prior to Admit Meds:   Prescriptions prior to admission  Medication Sig Dispense Refill  . amLODipine (NORVASC) 10 MG tablet Take 10 mg by mouth daily.      [name] aspirin 81 MG tablet Take 162 mg by mouth 2 (two) times daily.       . dorzolamide-timolol (COSOPT) 22.3-6.8 MG/ML ophthalmic solution Place 1 drop into both eyes daily.      . ferrous sulfate 325 (65 FE) MG tablet Take 325 mg by mouth daily with breakfast.      . furosemide (LASIX) 80 MG tablet Take 80-120 mg by mouth daily. 120 mg on Monday, Wednesday, and Friday and 80 mg all other days      . HYDROcodone-acetaminophen (LORTAB) 7.5-500 MG per tablet Take 1 tablet by mouth 4 (four) times daily as needed. For pain  65 tablet  0  . insulin glargine (LANTUS) 100 UNIT/ML injection Inject 12 Units into the skin at bedtime.      . insulin lispro (HUMALOG) 100 UNIT/ML injection Inject 6 Units into the skin 2 (two) times daily with breakfast and lunch.      . insulin lispro  (HUMALOG) 100 UNIT/ML injection Inject 8 Units into the skin daily after supper.      . isosorbide mononitrate (IMDUR) 30 MG 24 hr tablet Take 30 mg by mouth daily.      [name] levofloxacin (LEVAQUIN) 750 MG tablet Take 1 tablet (750 mg total) by mouth daily.  7 tablet  0  . omeprazole (PRILOSEC OTC) 20 MG tablet Take 20 mg by mouth daily.      Bertram Gala Glycol-Propyl Glycol (SYSTANE OP) Apply 1 drop to eye daily.      . polyethylene glycol (MIRALAX / GLYCOLAX) packet Take 17 g by mouth daily as needed. For constipation      . potassium chloride SA (K-DUR,KLOR-CON) 20 MEQ tablet Take 20 mEq by mouth daily.      . valsartan (DIOVAN) 160 MG tablet Take 160 mg by mouth 2 (two) times daily.        Fam HX:    Family History  Problem Relation Age of Onset  . Hypertension Father    Social HX:    History   Social History  . Marital Status: Widowed    Spouse Name:  N/A    Number of Children: N/A  . Years of Education: N/A   Occupational History  . Not on file.   Social History Main Topics  . Smoking status: Never Smoker   . Smokeless tobacco: Never Used  . Alcohol Use: No  . Drug Use: No  . Sexually Active: Not Currently   Other Topics Concern  . Not on file   Social History Narrative  . No narrative on file     ROS:  All 11 ROS were addressed and are negative except what is stated in the HPI  Physical Exam: Blood pressure 160/82, pulse 78, resp. rate 14, height 5\' 7"  (1.702 m), weight 93.6 kg (206 lb 5.6 oz), SpO2 97.00%.    General: Well developed, well nourished, in no acute distress Head:   Normal cephalic and atramatic  Lungs:  Bibasilar crackles Heart:   HRRR S1 S2  Abdomen: Obese, soft, nontender Msk:  Back normal,  Normal strength and tone for age. Extremities:  1+ pitting bilateral lower extremity edema.   Neuro: Alert and oriented X 3. Psych:  Normal affect, responds appropriately    Labs:   Lab Results  Component Value Date   WBC 5.9 02/29/2012   HGB 12.1*  02/29/2012   HCT 36.7* 02/29/2012   MCV 93.6 02/29/2012   PLT 198 02/29/2012    Lab 02/29/12 0537  NA 136  K 3.9  CL 101  CO2 25  BUN 24*  CREATININE 1.36*  CALCIUM 9.2  PROT --  BILITOT --  ALKPHOS --  ALT --  AST --  GLUCOSE 168*   No results found for this basename: PTT   Lab Results  Component Value Date   INR 1.00 02/29/2012   INR 1.05 01/30/2011   INR 1.11 04/24/2010   Lab Results  Component Value Date   CKTOTAL 452* 05/26/2011   CKMB 3.3 05/26/2011   TROPONINI <0.30 02/29/2012     No results found for this basename: CHOL   No results found for this basename: HDL   No results found for this basename: LDLCALC   No results found for this basename: TRIG   No results found for this basename: CHOLHDL   No results found for this basename: LDLDIRECT      Radiology:  Dg Chest Port 1 View  02/29/2012  *RADIOLOGY REPORT*  Clinical Data: Shortness of breath and respiratory distress.  PORTABLE CHEST - 1 VIEW  Comparison: 02/09/2012  Findings: Shallow inspiration.  Mild cardiac enlargement and pulmonary vascular congestion.  Perihilar infiltrates are increasing since previous study may represent edema or pneumonia. Obscuration of the left hemidiaphragm suggest small left pleural effusion.  No pneumothorax.  Calcification of the aorta.  IMPRESSION: Cardiac enlargement with mild pulmonary vascular congestion. Bilateral perihilar infiltrates are increasing and may represent pneumonia or edema.  Small left pleural effusion.   Original Report Authenticated By: Marlon Pel, M.D.     EKG:  Atrial paced rhythm, no ST segment changes  ASSESSMENT: Acute on chronic diastolic heart failure  PLAN:  I think source of breath was related to fluid overload.  He feels much better after receiving Lasix and having significant urine output.  He had lower extremity edema as well as vascular congestion on chest x-ray.  We spoke about the importance of limiting salt in his diet.  He  should also weigh himself daily.  We'll have to give him some flexibility with his Lasix dosing at home based on his weight and  whether or not he is developing edema.  I don't think we need to repeat an echocardiogram at this time.  Hypertension.  His blood pressure is somewhat elevated at this time.  This could also exacerbate diastolic heart failure.  Continue amlodipine.  He is not on ACE inhibitor, likely due to his renal insufficiency.  Close monitoring of the low pressure as an outpatient may also be helpful to try and prevent these episodes of fluid overload.  Corky Crafts., MD  02/29/2012  2:40 PM

## 2012-02-29 NOTE — ED Notes (Signed)
Multi attempts to establish IV unsuccessful.  IV team called for IV start

## 2012-02-29 NOTE — ED Notes (Signed)
Pt alert, able to talk in full sentences on BIPAP.  Denies CP, chills, fever.  Cardiac monitor a-paced, rate 76.

## 2012-03-01 ENCOUNTER — Inpatient Hospital Stay (HOSPITAL_COMMUNITY): Payer: Medicare Other

## 2012-03-01 DIAGNOSIS — J9600 Acute respiratory failure, unspecified whether with hypoxia or hypercapnia: Secondary | ICD-10-CM

## 2012-03-01 DIAGNOSIS — E119 Type 2 diabetes mellitus without complications: Secondary | ICD-10-CM

## 2012-03-01 DIAGNOSIS — I10 Essential (primary) hypertension: Secondary | ICD-10-CM

## 2012-03-01 DIAGNOSIS — R0602 Shortness of breath: Secondary | ICD-10-CM

## 2012-03-01 LAB — CBC
HCT: 33.5 % — ABNORMAL LOW (ref 39.0–52.0)
Hemoglobin: 11 g/dL — ABNORMAL LOW (ref 13.0–17.0)
MCH: 30.6 pg (ref 26.0–34.0)
MCHC: 32.8 g/dL (ref 30.0–36.0)
MCV: 93.3 fL (ref 78.0–100.0)
RDW: 14 % (ref 11.5–15.5)

## 2012-03-01 LAB — BASIC METABOLIC PANEL
BUN: 23 mg/dL (ref 6–23)
Chloride: 104 mEq/L (ref 96–112)
Creatinine, Ser: 1.37 mg/dL — ABNORMAL HIGH (ref 0.50–1.35)
GFR calc Af Amer: 50 mL/min — ABNORMAL LOW (ref >90)
Glucose, Bld: 111 mg/dL — ABNORMAL HIGH (ref 70–99)
Potassium: 3.6 mEq/L (ref 3.5–5.1)

## 2012-03-01 LAB — HEMOGLOBIN A1C: Hgb A1c MFr Bld: 8.4 % — ABNORMAL HIGH (ref <5.7)

## 2012-03-01 LAB — GLUCOSE, CAPILLARY

## 2012-03-01 LAB — TSH: TSH: 0.705 u[IU]/mL (ref 0.350–4.500)

## 2012-03-01 MED ORDER — FUROSEMIDE 40 MG PO TABS: 40.0000 mg | ORAL_TABLET | ORAL | Status: DC | PRN

## 2012-03-01 MED ORDER — FUROSEMIDE 80 MG PO TABS: 80.0000 mg | ORAL_TABLET | Freq: Every day | ORAL | Status: DC

## 2012-03-01 NOTE — Progress Notes (Signed)
SUBJECTIVE:  Feels better.  Breathing back to baseline but he is still on oxygen.  OBJECTIVE:   Vitals:   Filed Vitals:   02/29/12 2009 03/01/12 0205 03/01/12 0419 03/01/12 0848  BP: 120/56 145/73 153/76   Pulse: 73 80 75   Temp: 98.1 F (36.7 C)  98 F (36.7 C)   TempSrc: Oral  Oral   Resp: 18 18 18    Height:      Weight:   93.668 kg (206 lb 8 oz) 91.8 kg (202 lb 6.1 oz)  SpO2: 98% 97% 96%    I&O's:   Intake/Output Summary (Last 24 hours) at 03/01/12 1040 Last data filed at 03/01/12 0545  Gross per 24 hour  Intake    380 ml  Output   1626 ml  Net  -1246 ml   TELEMETRY: Reviewed telemetry pt in atrial paced rhythm:     PHYSICAL EXAM General: Well developed, well nourished, in no acute distress Head: [name]   Normal cephalic and atramatic  Lungs:   Clear bilaterally to auscultation and percussion. Heart:  HRRR S1 S2 Abdomen: , abdomen soft and non-tender Msk:  . Normal strength and tone for age. Extremities:  Trace pretibial edema.   Neuro: Alert and oriented X 3. Psych:  Normal affect, responds appropriately   LABS: Basic Metabolic Panel:  Basename 03/01/12 0545 02/29/12 0537  NA 143 136  K 3.6 3.9  CL 104 101  CO2 31 25  GLUCOSE 111* 168*  BUN 23 24*  CREATININE 1.37* 1.36*  CALCIUM 9.0 9.2  MG -- --  PHOS -- --   Liver Function Tests: No results found for this basename: AST:2,ALT:2,ALKPHOS:2,BILITOT:2,PROT:2,ALBUMIN:2 in the last 72 hours No results found for this basename: LIPASE:2,AMYLASE:2 in the last 72 hours CBC:  Basename 03/01/12 0545 02/29/12 0537  WBC 5.9 5.9  NEUTROABS -- --  HGB 11.0* 12.1*  HCT 33.5* 36.7*  MCV 93.3 93.6  PLT 182 198   Cardiac Enzymes:  Basename 02/29/12 0538  CKTOTAL --  CKMB --  CKMBINDEX --  TROPONINI <0.30   BNP: No components found with this basename: POCBNP:3 D-Dimer: No results found for this basename: DDIMER:2 in the last 72 hours Hemoglobin A1C:  Basename 02/29/12 1830  HGBA1C 8.4*   Fasting  Lipid Panel: No results found for this basename: CHOL,HDL,LDLCALC,TRIG,CHOLHDL,LDLDIRECT in the last 72 hours Thyroid Function Tests:  Basename 02/29/12 1830  TSH 0.705  T4TOTAL --  T3FREE --  THYROIDAB --   Anemia Panel: No results found for this basename: VITAMINB12,FOLATE,FERRITIN,TIBC,IRON,RETICCTPCT in the last 72 hours Coag Panel:   Lab Results  Component Value Date   INR 1.00 02/29/2012   INR 1.05 01/30/2011   INR 1.11 04/24/2010    RADIOLOGY: X-ray Chest Pa And Lateral   03/01/2012  *RADIOLOGY REPORT*  Clinical Data: Shortness of breath.  Hypertension.  Coronary artery disease.  Diabetes.  CHEST - 2 VIEW  Comparison: 02/29/2012  Findings: Dual lead pacer remains in place.  Cardiomegaly noted with mild interstitial accentuation and blunting of the posterior costophrenic angles.  Thoracic spondylosis is present.  Aeration has improved in the lung bases.  IMPRESSION:  1.  Cardiomegaly with interstitial edema and trace bilateral pleural effusions.  Improved airspace opacities in the lung bases.   Original Report Authenticated By: Dellia Cloud, M.D.    Dg Chest Port 1 View  02/29/2012  *RADIOLOGY REPORT*  Clinical Data: Shortness of breath and respiratory distress.  PORTABLE CHEST - 1 VIEW  Comparison: 02/09/2012  Findings: Shallow inspiration.  Mild cardiac enlargement and pulmonary vascular congestion.  Perihilar infiltrates are increasing since previous study may represent edema or pneumonia. Obscuration of the left hemidiaphragm suggest small left pleural effusion.  No pneumothorax.  Calcification of the aorta.  IMPRESSION: Cardiac enlargement with mild pulmonary vascular congestion. Bilateral perihilar infiltrates are increasing and may represent pneumonia or edema.  Small left pleural effusion.   Original Report Authenticated By: Marlon Pel, M.D.    Dg Chest Port 1 View  02/09/2012  *RADIOLOGY REPORT*  Clinical Data: CHF versus pneumonia  PORTABLE CHEST - 1 VIEW   Comparison: 05/26/2011  Findings: Patchy opacity in the medial right lower lung, suspicious for pneumonia.  Additional left basilar opacity, possibly atelectasis.  No frank interstitial edema.  No pleural effusion or pneumothorax.  Stable mild cardiomegaly.  Left subclavian pacemaker.  IMPRESSION: Patchy opacity in the medial right lower lung, suspicious for pneumonia.   Original Report Authenticated By: Charline Bills, M.D.       ASSESSMENT: Acute diastolic heart failure, hypertension  PLAN:  Volume status is much improved.  His oxygen saturations are quite good now.  Would like to try to wean oxygen.  Would continue current dose of Lasix at home.  I would give him the option of taking an extra 40 mg of Lasix daily as needed if he notes that his weight has increased or if he has worsening lower extremity edema.  He states that his blood pressures are typically well-controlled at home.  He is currently not on ACE inhibitor, likely due to renal insufficiency.  Continue current blood pressure medicines.  Corky Crafts., MD  03/01/2012  10:40 AM

## 2012-03-01 NOTE — Discharge Summary (Signed)
Physician Discharge Summary  [name] ZOX:[id number] DOB: August 15, 1918 DOA: 02/29/2012  PCP: [name], MD  Admit date: 02/29/2012 Discharge date: 03/01/2012  Recommendations for Outpatient Follow-up:  1. Daily weights 2. Lasix extra dose PRN 3. 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 He does have history of insulin-dependent diabetes mellitus, hypertension and diastolic CHF. He was recently treated for pneumonia. He came in to the hospital because of shortness of breath. [name] said he was doing fine until this morning when he woke up about 4 AM and when he was walking to the bathroom he felt short of breath, he took extra 80 mg of Lasix and got back to bed. He was still feeling short of breath so he called 911. He said he sleeps in 2 pillows for long time, he noticed increased swelling in his legs, denies fever, chills, palpitations or chest pain. He'll be admitted to the hospital for further evaluation.   Hospital Course:  Volume status is much improved. His oxygen saturations are quite good now- Off O2.  Patient ambulated well.  Discussed with cardiology, would continue current dose of Lasix at home and give him the option of taking an extra 40 mg of Lasix daily as needed if he notes that his weight has increased or if he has worsening lower extremity edema.   Check BMP as outpatient to follow Cr.  And wheather or not ARB will need to be  d/c'd.  Cr. 1.3 during this hospitalization, 1.2 during last hospitalization   Consultations:  cardiology  Discharge Exam: Filed Vitals:   03/01/12 0848 03/01/12 1015 03/01/12 1048 03/01/12 1130  BP:  144/69    Pulse:  74    Temp:  98.4 F (36.9 C)    TempSrc:  Oral    Resp:  18    Height:      Weight: 91.8 kg (202 lb 6.1 oz)     SpO2:  100% 97% 98%    General: A+Ox3, pleasant/cooperative Cardiovascular: rrr Respiratory: few crackles lower lobes  Discharge Instructions      Discharge Orders    Future Orders Please Complete By Expires   Diet - low sodium heart healthy      Diet Carb Modified      Increase activity slowly      (HEART FAILURE PATIENTS) Call MD:  Anytime you have any of the following symptoms: 1) 3 pound weight gain in 24 hours or 5 pounds in 1 week 2) shortness of breath, with or without a dry hacking cough 3) swelling in the hands, feet or stomach 4) if you have to sleep on extra pillows at night in order to breathe.      Discharge instructions      Comments:   BMP on Tuesday (re Cr. And K+) Can take an extra 40 mg lasix if weight goes up or leg swelling worsens Needs to weigh self daily (dry weight about  91 kg) Needs to follow fluid restriction and low NA diet (1.5L)       Medication List     As of 03/01/2012  1:43 PM    STOP taking these medications         levofloxacin 750 MG tablet   Commonly known as: LEVAQUIN      TAKE these medications         amLODipine 10 MG tablet   Commonly known as: NORVASC   Take 10 mg by mouth daily.      aspirin 81 MG tablet   Take 162 mg by mouth 2 (two) times daily.      dorzolamide-timolol 22.3-6.8 MG/ML ophthalmic solution   Commonly known as: COSOPT   Place 1 drop into both eyes daily.      ferrous sulfate 325 (65 FE) MG tablet   Take 325 mg by mouth daily with breakfast.      furosemide 80 MG tablet   Commonly known as: LASIX   Take 80-120 mg by mouth daily. 120 mg on Monday, Wednesday, and  Friday and 80 mg all other days      furosemide 40 MG tablet   Commonly known as: LASIX   Take 1 tablet (40 mg total) by mouth as needed (weight gain, less swelling, SOB).      HYDROcodone-acetaminophen 7.5-500 MG per tablet   Commonly known as: LORTAB   Take 1 tablet by mouth 4 (four) times daily as needed. For pain      insulin glargine 100 UNIT/ML injection   Commonly known as: LANTUS   Inject 12 Units into the skin at bedtime.      insulin lispro 100 UNIT/ML injection   Commonly known as: HUMALOG   Inject 6 Units into the skin 2 (two) times daily with breakfast and lunch.      insulin lispro 100 UNIT/ML injection   Commonly known as: HUMALOG   Inject 8 Units into the skin daily after supper.      isosorbide mononitrate 30 MG 24 hr tablet   Commonly known as: IMDUR   Take 30 mg by mouth daily.      omeprazole 20 MG tablet   Commonly known as: PRILOSEC OTC   Take 20 mg by mouth daily.      polyethylene glycol packet   Commonly known as: MIRALAX / GLYCOLAX   Take 17 g by mouth daily as needed. For constipation      potassium chloride SA 20 MEQ tablet   Commonly known as: K-DUR,KLOR-CON   Take 20 mEq by mouth daily.      SYSTANE OP   Apply 1 drop to eye daily.      valsartan 160 MG tablet   Commonly known as: DIOVAN   Take 160 mg by mouth 2 (two) times daily.        Follow-up Information    Follow up with [name], MD. In 1 week.   Contact information:   [address] [place] [address] [phone]       Follow up with Lesleigh Noe, MD. In 1 month.   Contact information:   [address] [place] [address] [phone]           The results of significant diagnostics from this hospitalization (including imaging, microbiology, ancillary and laboratory) are listed below for reference.    Significant Diagnostic Studies: X-ray Chest Pa And Lateral   03/01/2012   *  RADIOLOGY REPORT*  Clinical Data: Shortness of breath.  Hypertension.  Coronary artery disease.  Diabetes.  CHEST - 2 VIEW  Comparison: 02/29/2012  Findings: Dual lead pacer remains in place.  Cardiomegaly noted with mild interstitial accentuation and blunting of the posterior costophrenic angles.  Thoracic spondylosis is present.  Aeration has improved in the lung bases.  IMPRESSION:  1.  Cardiomegaly with interstitial edema and trace bilateral pleural effusions.  Improved airspace opacities in the lung bases.   Original Report Authenticated By: Dellia Cloud, M.D.    Dg Chest Port 1 View  02/29/2012  *RADIOLOGY REPORT*  Clinical Data: Shortness of breath and respiratory distress.  PORTABLE CHEST - 1 VIEW  Comparison: 02/09/2012  Findings: Shallow inspiration.  Mild cardiac enlargement and pulmonary vascular congestion.  Perihilar infiltrates are increasing since previous study may represent edema or pneumonia. Obscuration of the left hemidiaphragm suggest small left pleural effusion.  No pneumothorax.  Calcification of the aorta.  IMPRESSION: Cardiac enlargement with mild pulmonary vascular congestion. Bilateral perihilar infiltrates are increasing and may represent pneumonia or edema.  Small left pleural effusion.   Original Report Authenticated By: Marlon Pel, M.D.    Dg Chest Port 1 View  02/09/2012  *RADIOLOGY REPORT*  Clinical Data: CHF versus pneumonia  PORTABLE CHEST - 1 VIEW  Comparison: 05/26/2011  Findings: Patchy opacity in the medial right lower lung, suspicious for pneumonia.  Additional left basilar opacity, possibly atelectasis.  No frank interstitial edema.  No pleural effusion or pneumothorax.  Stable mild cardiomegaly.  Left subclavian pacemaker.  IMPRESSION: Patchy opacity in the medial right lower lung, suspicious for pneumonia.   Original Report Authenticated By: Charline Bills, M.D.     Microbiology: Recent Results (from the past 240 hour(s))  CULTURE, BLOOD  (ROUTINE X 2)     Status: Normal (Preliminary result)   Collection Time   02/29/12  7:05 AM      Component Value Range Status Comment   Specimen Description BLOOD HAND LEFT   Final    Special Requests     Final    Value: BOTTLES DRAWN AEROBIC AND ANAEROBIC BLUE 10CC RED 8CC   Culture  Setup Time 02/29/2012 13:52   Final    Culture     Final    Value:        BLOOD CULTURE RECEIVED NO GROWTH TO DATE CULTURE WILL BE HELD FOR 5 DAYS BEFORE ISSUING A FINAL NEGATIVE REPORT   Report Status PENDING   Incomplete   CULTURE, BLOOD (ROUTINE X 2)     Status: Normal (Preliminary result)   Collection Time   02/29/12  7:07 AM      Component Value Range Status Comment   Specimen Description BLOOD ARM RIGHT   Final    Special Requests BOTTLES DRAWN AEROBIC ONLY 2CC   Final    Culture  Setup Time 02/29/2012 13:52   Final    Culture     Final    Value:        BLOOD CULTURE RECEIVED NO GROWTH TO DATE CULTURE WILL BE HELD FOR 5 DAYS BEFORE ISSUING A FINAL NEGATIVE REPORT   Report Status PENDING   Incomplete      Labs: Basic Metabolic Panel:  Lab 03/01/12 4098 02/29/12 0537  NA 143 136  K 3.6 3.9  CL 104 101  CO2 31 25  GLUCOSE 111* 168*  BUN 23 24*  CREATININE 1.37* 1.36*  CALCIUM 9.0 9.2  MG -- --  PHOS -- --  Liver Function Tests: No results found for this basename: AST:5,ALT:5,ALKPHOS:5,BILITOT:5,PROT:5,ALBUMIN:5 in the last 168 hours No results found for this basename: LIPASE:5,AMYLASE:5 in the last 168 hours No results found for this basename: AMMONIA:5 in the last 168 hours CBC:  Lab 03/01/12 0545 02/29/12 0537  WBC 5.9 5.9  NEUTROABS -- --  HGB 11.0* 12.1*  HCT 33.5* 36.7*  MCV 93.3 93.6  PLT 182 198   Cardiac Enzymes:  Lab 02/29/12 0538  CKTOTAL --  CKMB --  CKMBINDEX --  TROPONINI <0.30   BNP: BNP (last 3 results)  Basename 02/29/12 0538 02/09/12 1405 02/09/12 0843  PROBNP 1277.0* 2155.0* 1547.0*   CBG:  Lab 03/01/12 1125 03/01/12 0618 02/29/12 2057 02/29/12  1627 02/29/12 1122  GLUCAP 216* 112* 123* 310* 172*    Time coordinating discharge: 35 minutes  Signed:  Benjamine Mola, Chrislynn Mosely  [facility] 03/01/2012, 1:43 PM

## 2012-03-01 NOTE — Progress Notes (Signed)
Pt ambulated in hallway tolerated well wo oxygen. Spo2 96-98 % using a walker. Returned to chair.

## 2012-03-06 LAB — CULTURE, BLOOD (ROUTINE X 2): Culture: NO GROWTH

## 2012-03-09 ENCOUNTER — Emergency Department (HOSPITAL_COMMUNITY)
Admission: EM | Admit: 2012-03-09 | Discharge: 2012-03-09 | Disposition: A | Discharge status: Home / Self Care | Payer: Medicare Other | Attending: Emergency Medicine | Admitting: Emergency Medicine

## 2012-03-09 ENCOUNTER — Emergency Department (HOSPITAL_COMMUNITY): Payer: Medicare Other

## 2012-03-09 DIAGNOSIS — R0602 Shortness of breath: Secondary | ICD-10-CM

## 2012-03-09 DIAGNOSIS — I5033 Acute on chronic diastolic (congestive) heart failure: Secondary | ICD-10-CM

## 2012-03-09 DIAGNOSIS — E119 Type 2 diabetes mellitus without complications: Secondary | ICD-10-CM | POA: Insufficient documentation

## 2012-03-09 DIAGNOSIS — I509 Heart failure, unspecified: Secondary | ICD-10-CM | POA: Insufficient documentation

## 2012-03-09 DIAGNOSIS — Z79899 Other long term (current) drug therapy: Secondary | ICD-10-CM | POA: Insufficient documentation

## 2012-03-09 DIAGNOSIS — I10 Essential (primary) hypertension: Secondary | ICD-10-CM | POA: Insufficient documentation

## 2012-03-09 DIAGNOSIS — E785 Hyperlipidemia, unspecified: Secondary | ICD-10-CM | POA: Insufficient documentation

## 2012-03-09 DIAGNOSIS — I2510 Atherosclerotic heart disease of native coronary artery without angina pectoris: Secondary | ICD-10-CM | POA: Insufficient documentation

## 2012-03-09 DIAGNOSIS — Z7982 Long term (current) use of aspirin: Secondary | ICD-10-CM | POA: Insufficient documentation

## 2012-03-09 DIAGNOSIS — Z794 Long term (current) use of insulin: Secondary | ICD-10-CM | POA: Insufficient documentation

## 2012-03-09 LAB — CBC WITH DIFFERENTIAL/PLATELET
Eosinophils Relative: 6 % — ABNORMAL HIGH (ref 0–5)
Hemoglobin: 12.5 g/dL — ABNORMAL LOW (ref 13.0–17.0)
Lymphocytes Relative: 29 % (ref 12–46)
Lymphs Abs: 1.6 10*3/uL (ref 0.7–4.0)
MCH: 31.2 pg (ref 26.0–34.0)
MCV: 92.8 fL (ref 78.0–100.0)
Monocytes Relative: 11 % (ref 3–12)
Neutrophils Relative %: 54 % (ref 43–77)
Platelets: 227 10*3/uL (ref 150–400)
RBC: 4.01 MIL/uL — ABNORMAL LOW (ref 4.22–5.81)
WBC: 5.5 10*3/uL (ref 4.0–10.5)

## 2012-03-09 LAB — URINALYSIS, ROUTINE W REFLEX MICROSCOPIC
Nitrite: NEGATIVE
Specific Gravity, Urine: 1.013 (ref 1.005–1.030)
Urobilinogen, UA: 0.2 mg/dL (ref 0.0–1.0)
pH: 7 (ref 5.0–8.0)

## 2012-03-09 LAB — POCT I-STAT TROPONIN I: Troponin i, poc: 0.08 ng/mL (ref 0.00–0.08)

## 2012-03-09 LAB — BASIC METABOLIC PANEL
CO2: 29 mEq/L (ref 19–32)
Calcium: 9.4 mg/dL (ref 8.4–10.5)
Glucose, Bld: 118 mg/dL — ABNORMAL HIGH (ref 70–99)
Potassium: 3.7 mEq/L (ref 3.5–5.1)
Sodium: 135 mEq/L (ref 135–145)

## 2012-03-09 LAB — GLUCOSE, CAPILLARY: Glucose-Capillary: 103 mg/dL — ABNORMAL HIGH (ref 70–99)

## 2012-03-09 MED ORDER — FUROSEMIDE 10 MG/ML IJ SOLN
80.0000 mg | Freq: Once | INTRAMUSCULAR | Status: AC
  Administered 2012-03-09: 80 mg via INTRAVENOUS
  Filled 2012-03-09: qty 8

## 2012-03-09 NOTE — ED Notes (Signed)
Pt awoke with resp distress this am  ems report initial sats at 88%

## 2012-03-09 NOTE — Consult Note (Signed)
  Reason for Consult:sob   Referring Physician: [name] is an 78 y.o. male.   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 The patient was discharged but yesterday began to experience increasing dyspnea and cough. He presents today with increased sob. His initial cxr demonstrates mild increased pulmonary vascular markings c/w mild CHF. He denies syncope or dietary or medical non-compliance.  PMH: Past Medical History  Diagnosis Date  . CHF (congestive heart failure)   . Diabetes mellitus   . Hypertension   . Hyperlipidemia   . Coronary artery disease     PSHX: Past Surgical History  Procedure Date  . Lumbar laminectomy   . Back surgery   . Total knee arthroplasty   . Quadriceps tendon repair   . Cataract extraction   . Insert / replace / remove pacemaker     FAMHX: Family History  Problem Relation Age of Onset  . Hypertension Father     Social History:  reports that he has never smoked. He has never used smokeless tobacco. He reports that he does not drink alcohol or use illicit drugs.  Allergies:  Allergies  Allergen Reactions  . Statins Other (See Comments)    rhabdomyolisis    Medications: reviewed  Dg Chest Portable 1 View  03/09/2012  *RADIOLOGY REPORT*  Clinical Data: Respiratory distress  PORTABLE CHEST - 1 VIEW  Comparison: 03/01/2012  Findings: Left subclavian pacemaker stable.  Heart size upper limits normal.  Mild perihilar and bibasilar interstitial edema or infiltrates, little definite change since previous exam.  No definite effusion.  Regional bones unremarkable.  IMPRESSION:  1.  Mild bilateral edema or infiltrates.   Original Report Authenticated By: [name], M.D.     ROS  As stated in the HPI and negative for all other systems.  Physical Exam  Vitals:Blood pressure 164/92, pulse 75,  temperature 97.4 F (36.3 C), temperature source Oral, resp. rate 17, SpO2 99.00%.  elderly appearing man, NAD HEENT: Unremarkable Neck:  7 cm JVD, no thyromegally Lungs:  Clear with no wheezes. Mild rales in the bases about 1/3 up bilaterally. HEART:  Regular rate rhythm, no murmurs, no rubs, no clicks Abd:  Flat, positive bowel sounds, no organomegally, no rebound, no guarding Ext:  2 plus pulses, no edema, no cyanosis, no clubbing Skin:  No rashes no nodules Neuro:  CN II through XII intact, motor grossly intact  ECG - NSR Assessment/Plan: 1. Acute/chronic diastolic CHF - will attempt to diurese today and not admit, with early followup in the next day or two. If he cannot be adequately diuresed in the ED will admit for observation. He may require more lasix or switch to demadex.   Darrell Bunting MD 03/09/2012, 12:01 PM

## 2012-03-09 NOTE — ED Notes (Signed)
Has had dry cough since yesterday

## 2012-03-09 NOTE — ED Provider Notes (Signed)
History     CSN: [id number]  Arrival date & time 03/09/12  4098   First MD Initiated Contact with Patient 03/09/12 0801      Chief Complaint  Patient presents with  . Respiratory Distress    (Consider location/radiation/quality/duration/timing/severity/associated sxs/prior treatment) HPI Complains of shortness of breath onset approximately 2 hours ago preceded by slight cough symptoms feel like congestive heart failure he states in the past EMS treated patient with BiPAP and one sublingual nitroglycerin tablet with partial relief he denies chest pain denies fever denies other associated symptoms. EMS reports that he had intermittent PSVT while en route Past Medical History  Diagnosis Date  . CHF (congestive heart failure)   . Diabetes mellitus   . Hypertension   . Hyperlipidemia   . Coronary artery disease     Past Surgical History  Procedure Date  . Lumbar laminectomy   . Back surgery   . Total knee arthroplasty   . Quadriceps tendon repair   . Cataract extraction   . Insert / replace / remove pacemaker    pacemaker insertion  Family History  Problem Relation Age of Onset  . Hypertension Father     History  Substance Use Topics  . Smoking status: Never Smoker   . Smokeless tobacco: Never Used  . Alcohol Use: No      Review of Systems  Respiratory: Positive for cough and shortness of breath.     Allergies  Statins  Home Medications   Current Outpatient Rx  Name Route Sig Dispense Refill  . AMLODIPINE BESYLATE 10 MG PO TABS Oral Take 10 mg by mouth daily.    . ASPIRIN 81 MG PO TABS Oral Take 162 mg by mouth 2 (two) times daily.     . DORZOLAMIDE HCL-TIMOLOL MAL 22.3-6.8 MG/ML OP SOLN Both Eyes Place 1 drop into both eyes daily.    [name] FERROUS SULFATE 325 (65 FE) MG PO TABS Oral Take 325 mg by mouth daily with breakfast.    . FUROSEMIDE 40 MG PO TABS Oral Take 1 tablet (40 mg total) by mouth as needed (weight gain, less swelling, SOB). 30 tablet 0  .  FUROSEMIDE 80 MG PO TABS Oral Take 1-1.5 tablets (80-120 mg total) by mouth daily. 120 mg on Monday, Wednesday, and Friday and 80 mg all other days 45 tablet 1  . HYDROCODONE-ACETAMINOPHEN 7.5-500 MG PO TABS Oral Take 1 tablet by mouth 4 (four) times daily as needed. For pain 65 tablet 0  . INSULIN GLARGINE 100 UNIT/ML [place] SOLN Subcutaneous Inject 12 Units into the skin at bedtime.    . INSULIN LISPRO (HUMAN) 100 UNIT/ML [place] SOLN Subcutaneous Inject 6 Units into the skin 2 (two) times daily with breakfast and lunch.    . INSULIN LISPRO (HUMAN) 100 UNIT/ML [place] SOLN Subcutaneous Inject 8 Units into the skin daily after supper.    . ISOSORBIDE MONONITRATE ER 30 MG PO TB24 Oral Take 30 mg by mouth daily.    [name] OMEPRAZOLE MAGNESIUM 20 MG PO TBEC Oral Take 20 mg by mouth daily.    Frazier Butt OP Ophthalmic Apply 1 drop to eye daily.    [name] POLYETHYLENE GLYCOL 3350 PO PACK Oral Take 17 g by mouth daily as needed. For constipation    . POTASSIUM CHLORIDE CRYS ER 20 MEQ PO TBCR Oral Take 20 mEq by mouth daily.    [name] VALSARTAN 160 MG PO TABS Oral Take 160 mg by mouth 2 (two) times daily.  There were no vitals taken for this visit.  Physical Exam  Nursing note and vitals reviewed. Constitutional: He appears well-developed and well-nourished.  HENT:  Head: Normocephalic and atraumatic.  Eyes: Conjunctivae normal are normal. Pupils are equal, round, and reactive to light.  Neck: Neck supple. No tracheal deviation present. No thyromegaly present.  Cardiovascular: Normal rate.   No murmur heard.      Irregular  Pulmonary/Chest: Breath sounds normal.       Rales one third way up bilaterally  Abdominal: Soft. Bowel sounds are normal. He exhibits no distension. There is no tenderness.  Musculoskeletal: Normal range of motion. He exhibits no edema and no tenderness.  Neurological: He is alert. Coordination normal.  Skin: Skin is warm and dry. No rash noted.  Psychiatric: He has a normal mood and affect.     ED Course  Procedures (including critical care time)  Date: 03/09/2012  Rate: 70  Rhythm: normal sinus rhythm  QRS Axis: normal  Intervals: normal  ST/T Wave abnormalities: nonspecific T wave changes  Conduction Disutrbances:nonspecific intraventricular conduction delay  Narrative Interpretation:   Old EKG Reviewed: Tracing from 02/29/2012 showed a paced cardiac rhythm interpreted by me   Labs Reviewed  BASIC METABOLIC PANEL  CBC WITH DIFFERENTIAL   No results found. Results for orders placed during the hospital encounter of 03/09/12  BASIC METABOLIC PANEL      Component Value Range   Sodium 135  135 - 145 mEq/L   Potassium 3.7  3.5 - 5.1 mEq/L   Chloride 98  96 - 112 mEq/L   CO2 29  19 - 32 mEq/L   Glucose, Bld 118 (*) 70 - 99 mg/dL   BUN 23  6 - 23 mg/dL   Creatinine, Ser 6.21 (*) 0.50 - 1.35 mg/dL   Calcium 9.4  8.4 - 30.8 mg/dL   GFR calc non Af Amer 42 (*) >90 mL/min   GFR calc Af Amer 49 (*) >90 mL/min  CBC WITH DIFFERENTIAL      Component Value Range   WBC 5.5  4.0 - 10.5 K/uL   RBC 4.01 (*) 4.22 - 5.81 MIL/uL   Hemoglobin 12.5 (*) 13.0 - 17.0 g/dL   HCT 65.7 (*) 84.6 - 96.2 %   MCV 92.8  78.0 - 100.0 fL   MCH 31.2  26.0 - 34.0 pg   MCHC 33.6  30.0 - 36.0 g/dL   RDW 95.2  84.1 - 32.4 %   Platelets 227  150 - 400 K/uL   Neutrophils Relative 54  43 - 77 %   Neutro Abs 3.0  1.7 - 7.7 K/uL   Lymphocytes Relative 29  12 - 46 %   Lymphs Abs 1.6  0.7 - 4.0 K/uL   Monocytes Relative 11  3 - 12 %   Monocytes Absolute 0.6  0.1 - 1.0 K/uL   Eosinophils Relative 6 (*) 0 - 5 %   Eosinophils Absolute 0.3  0.0 - 0.7 K/uL   Basophils Relative 0  0 - 1 %   Basophils Absolute 0.0  0.0 - 0.1 K/uL   point-of-care troponin I 0.08, normal Chest x-ray reviewed by me X-ray Chest Pa And Lateral   03/01/2012  *RADIOLOGY REPORT*  Clinical Data: Shortness of breath.  Hypertension.  Coronary artery disease.  Diabetes.  CHEST - 2 VIEW  Comparison: 02/29/2012  Findings: Dual  lead pacer remains in place.  Cardiomegaly noted with mild interstitial accentuation and blunting of the posterior costophrenic angles.  Thoracic spondylosis is present.  Aeration has improved in the lung bases.  IMPRESSION:  1.  Cardiomegaly with interstitial edema and trace bilateral pleural effusions.  Improved airspace opacities in the lung bases.   Original Report Authenticated By: Dellia Cloud, M.D.    Dg Chest Portable 1 View  03/09/2012  *RADIOLOGY REPORT*  Clinical Data: Respiratory distress  PORTABLE CHEST - 1 VIEW  Comparison: 03/01/2012  Findings: Left subclavian pacemaker stable.  Heart size upper limits normal.  Mild perihilar and bibasilar interstitial edema or infiltrates, little definite change since previous exam.  No definite effusion.  Regional bones unremarkable.  IMPRESSION:  1.  Mild bilateral edema or infiltrates.   Original Report Authenticated By: Osa Craver, M.D.    Dg Chest Port 1 View  02/29/2012  *RADIOLOGY REPORT*  Clinical Data: Shortness of breath and respiratory distress.  PORTABLE CHEST - 1 VIEW  Comparison: 02/09/2012  Findings: Shallow inspiration.  Mild cardiac enlargement and pulmonary vascular congestion.  Perihilar infiltrates are increasing since previous study may represent edema or pneumonia. Obscuration of the left hemidiaphragm suggest small left pleural effusion.  No pneumothorax.  Calcification of the aorta.  IMPRESSION: Cardiac enlargement with mild pulmonary vascular congestion. Bilateral perihilar infiltrates are increasing and may represent pneumonia or edema.  Small left pleural effusion.   Original Report Authenticated By: Marlon Pel, M.D.    Dg Chest Port 1 View  02/09/2012  *RADIOLOGY REPORT*  Clinical Data: CHF versus pneumonia  PORTABLE CHEST - 1 VIEW  Comparison: 05/26/2011  Findings: Patchy opacity in the medial right lower lung, suspicious for pneumonia.  Additional left basilar opacity, possibly atelectasis.  No frank  interstitial edema.  No pleural effusion or pneumothorax.  Stable mild cardiomegaly.  Left subclavian pacemaker.  IMPRESSION: Patchy opacity in the medial right lower lung, suspicious for pneumonia.   Original Report Authenticated By: Charline Bills, M.D.      No diagnosis found.  Lasix ordered. Spoke with Dr. Rosette Reveal from cardiology who will evaluate patient in the ED. 1:45 AM patient resting comfortably Results for orders placed during the hospital encounter of 03/09/12  BASIC METABOLIC PANEL      Component Value Range   Sodium 135  135 - 145 mEq/L   Potassium 3.7  3.5 - 5.1 mEq/L   Chloride 98  96 - 112 mEq/L   CO2 29  19 - 32 mEq/L   Glucose, Bld 118 (*) 70 - 99 mg/dL   BUN 23  6 - 23 mg/dL   Creatinine, Ser 1.61 (*) 0.50 - 1.35 mg/dL   Calcium 9.4  8.4 - 09.6 mg/dL   GFR calc non Af Amer 42 (*) >90 mL/min   GFR calc Af Amer 49 (*) >90 mL/min  CBC WITH DIFFERENTIAL      Component Value Range   WBC 5.5  4.0 - 10.5 K/uL   RBC 4.01 (*) 4.22 - 5.81 MIL/uL   Hemoglobin 12.5 (*) 13.0 - 17.0 g/dL   HCT 04.5 (*) 40.9 - 81.1 %   MCV 92.8  78.0 - 100.0 fL   MCH 31.2  26.0 - 34.0 pg   MCHC 33.6  30.0 - 36.0 g/dL   RDW 91.4  78.2 - 95.6 %   Platelets 227  150 - 400 K/uL   Neutrophils Relative 54  43 - 77 %   Neutro Abs 3.0  1.7 - 7.7 K/uL   Lymphocytes Relative 29  12 - 46 %   Lymphs Abs  1.6  0.7 - 4.0 K/uL   Monocytes Relative 11  3 - 12 %   Monocytes Absolute 0.6  0.1 - 1.0 K/uL   Eosinophils Relative 6 (*) 0 - 5 %   Eosinophils Absolute 0.3  0.0 - 0.7 K/uL   Basophils Relative 0  0 - 1 %   Basophils Absolute 0.0  0.0 - 0.1 K/uL  POCT I-STAT TROPONIN I      Component Value Range   Troponin i, poc 0.08  0.00 - 0.08 ng/mL   Comment 3           URINALYSIS, ROUTINE W REFLEX MICROSCOPIC      Component Value Range   Color, Urine YELLOW  YELLOW   APPearance CLEAR  CLEAR   Specific Gravity, Urine 1.013  1.005 - 1.030   pH 7.0  5.0 - 8.0   Glucose, UA NEGATIVE  NEGATIVE mg/dL    Hgb urine dipstick NEGATIVE  NEGATIVE   Bilirubin Urine NEGATIVE  NEGATIVE   Ketones, ur NEGATIVE  NEGATIVE mg/dL   Protein, ur NEGATIVE  NEGATIVE mg/dL   Urobilinogen, UA 0.2  0.0 - 1.0 mg/dL   Nitrite NEGATIVE  NEGATIVE   Leukocytes, UA NEGATIVE  NEGATIVE   X-ray Chest Pa And Lateral   03/01/2012  *RADIOLOGY REPORT*  Clinical Data: Shortness of breath.  Hypertension.  Coronary artery disease.  Diabetes.  CHEST - 2 VIEW  Comparison: 02/29/2012  Findings: Dual lead pacer remains in place.  Cardiomegaly noted with mild interstitial accentuation and blunting of the posterior costophrenic angles.  Thoracic spondylosis is present.  Aeration has improved in the lung bases.  IMPRESSION:  1.  Cardiomegaly with interstitial edema and trace bilateral pleural effusions.  Improved airspace opacities in the lung bases.   Original Report Authenticated By: Dellia Cloud, M.D.    Dg Chest Portable 1 View  03/09/2012  *RADIOLOGY REPORT*  Clinical Data: Respiratory distress  PORTABLE CHEST - 1 VIEW  Comparison: 03/01/2012  Findings: Left subclavian pacemaker stable.  Heart size upper limits normal.  Mild perihilar and bibasilar interstitial edema or infiltrates, little definite change since previous exam.  No definite effusion.  Regional bones unremarkable.  IMPRESSION:  1.  Mild bilateral edema or infiltrates.   Original Report Authenticated By: Osa Craver, M.D.    Dg Chest Port 1 View  02/29/2012  *RADIOLOGY REPORT*  Clinical Data: Shortness of breath and respiratory distress.  PORTABLE CHEST - 1 VIEW  Comparison: 02/09/2012  Findings: Shallow inspiration.  Mild cardiac enlargement and pulmonary vascular congestion.  Perihilar infiltrates are increasing since previous study may represent edema or pneumonia. Obscuration of the left hemidiaphragm suggest small left pleural effusion.  No pneumothorax.  Calcification of the aorta.  IMPRESSION: Cardiac enlargement with mild pulmonary vascular  congestion. Bilateral perihilar infiltrates are increasing and may represent pneumonia or edema.  Small left pleural effusion.   Original Report Authenticated By: Marlon Pel, M.D.    Dg Chest Port 1 View  02/09/2012  *RADIOLOGY REPORT*  Clinical Data: CHF versus pneumonia  PORTABLE CHEST - 1 VIEW  Comparison: 05/26/2011  Findings: Patchy opacity in the medial right lower lung, suspicious for pneumonia.  Additional left basilar opacity, possibly atelectasis.  No frank interstitial edema.  No pleural effusion or pneumothorax.  Stable mild cardiomegaly.  Left subclavian pacemaker.  IMPRESSION: Patchy opacity in the medial right lower lung, suspicious for pneumonia.   Original Report Authenticated By: Charline Bills, M.D.   Patient diuresed in  the emergency department after treatment with Lasix. A 1.5 p.m. he feels much improved is lying flat no respiratory chest is ready to go home . Per discussion with Dr. Ladona Ridgel discharge patient home. He is to follow up with Dr. Katrinka Blazing tomorrow  MDM  Clinically patient is in mild congestive heart failure Renal insufficiency mildly worsening since last visit   Diagnosis #1 CHF #2 renal insufficiency    Doug Sou, MD 03/09/12 1333

## 2012-03-09 NOTE — ED Notes (Signed)
rns x 2 have atttempted 2nd IV x 5 without success

## 2012-03-09 NOTE — ED Notes (Signed)
Pt alert and oriented on arrival to ED. States that that he feels much better since EMS took care of him

## 2012-03-09 NOTE — ED Notes (Signed)
Pt on BSC. Wife remians at bedside

## 2012-03-09 NOTE — Progress Notes (Signed)
Chaplain observed patient's wife outside [facility] looking like she needed help.  Greeted her and offered to help.  She stated patient needed to use a bedside commode and had been waiting almost 5 minutes..he needed to go now!  Chaplain assisted by speaking to a several staff members until patient received the commode as requested.  Chaplain Rutherford Nail

## 2012-03-09 NOTE — ED Notes (Signed)
Uses walker at home for ambulation

## 2012-03-10 ENCOUNTER — Ambulatory Visit
Admission: RE | Admit: 2012-03-10 | Discharge: 2012-03-10 | Disposition: A | Discharge status: Home / Self Care | Payer: Medicare Other | Source: Ambulatory Visit | Attending: Neurology | Admitting: Neurology

## 2012-03-10 ENCOUNTER — Other Ambulatory Visit: Payer: Self-pay | Admitting: Neurology

## 2012-03-10 DIAGNOSIS — G8190 Hemiplegia, unspecified affecting unspecified side: Secondary | ICD-10-CM

## 2012-03-26 ENCOUNTER — Ambulatory Visit: Payer: Medicare Other | Attending: Neurology | Admitting: Occupational Therapy

## 2012-03-26 DIAGNOSIS — IMO0001 Reserved for inherently not codable concepts without codable children: Secondary | ICD-10-CM | POA: Insufficient documentation

## 2012-03-26 DIAGNOSIS — R279 Unspecified lack of coordination: Secondary | ICD-10-CM | POA: Insufficient documentation

## 2012-03-26 DIAGNOSIS — M6281 Muscle weakness (generalized): Secondary | ICD-10-CM | POA: Insufficient documentation

## 2012-03-31 ENCOUNTER — Ambulatory Visit: Payer: Medicare Other | Admitting: *Deleted

## 2012-04-01 ENCOUNTER — Ambulatory Visit: Payer: Medicare Other | Admitting: Occupational Therapy

## 2012-04-02 ENCOUNTER — Ambulatory Visit: Payer: Medicare Other | Admitting: Occupational Therapy

## 2012-04-08 ENCOUNTER — Encounter: Payer: Medicare Other | Admitting: Occupational Therapy

## 2012-04-14 ENCOUNTER — Ambulatory Visit: Payer: Medicare Other | Attending: Neurology | Admitting: Occupational Therapy

## 2012-04-14 DIAGNOSIS — R279 Unspecified lack of coordination: Secondary | ICD-10-CM | POA: Insufficient documentation

## 2012-04-14 DIAGNOSIS — M6281 Muscle weakness (generalized): Secondary | ICD-10-CM | POA: Insufficient documentation

## 2012-04-14 DIAGNOSIS — IMO0001 Reserved for inherently not codable concepts without codable children: Secondary | ICD-10-CM | POA: Insufficient documentation

## 2012-04-16 ENCOUNTER — Ambulatory Visit: Payer: Medicare Other | Admitting: Occupational Therapy

## 2012-04-18 ENCOUNTER — Ambulatory Visit: Payer: Medicare Other | Admitting: Physical Therapy

## 2012-04-21 ENCOUNTER — Encounter: Payer: Medicare Other | Admitting: Occupational Therapy

## 2012-04-23 ENCOUNTER — Encounter: Payer: Medicare Other | Admitting: Occupational Therapy

## 2012-04-24 ENCOUNTER — Ambulatory Visit: Payer: Medicare Other | Admitting: Physical Therapy

## 2012-04-25 ENCOUNTER — Ambulatory Visit: Payer: Medicare Other | Admitting: Physical Therapy

## 2012-04-29 ENCOUNTER — Ambulatory Visit: Payer: Medicare Other | Admitting: Physical Therapy

## 2012-04-30 ENCOUNTER — Encounter: Payer: Medicare Other | Admitting: Occupational Therapy

## 2012-05-01 ENCOUNTER — Ambulatory Visit: Payer: Medicare Other | Admitting: Physical Therapy

## 2012-05-12 ENCOUNTER — Ambulatory Visit: Payer: Medicare Other | Admitting: Occupational Therapy

## 2012-05-20 ENCOUNTER — Encounter: Payer: Medicare Other | Admitting: Occupational Therapy

## 2012-05-22 ENCOUNTER — Ambulatory Visit: Payer: Medicare Other | Attending: Neurology | Admitting: Occupational Therapy

## 2012-05-22 DIAGNOSIS — R279 Unspecified lack of coordination: Secondary | ICD-10-CM | POA: Insufficient documentation

## 2012-05-22 DIAGNOSIS — IMO0001 Reserved for inherently not codable concepts without codable children: Secondary | ICD-10-CM | POA: Insufficient documentation

## 2012-05-22 DIAGNOSIS — M6281 Muscle weakness (generalized): Secondary | ICD-10-CM | POA: Insufficient documentation

## 2012-06-04 ENCOUNTER — Encounter (HOSPITAL_COMMUNITY): Payer: Self-pay | Admitting: Vascular Surgery

## 2012-06-04 ENCOUNTER — Inpatient Hospital Stay (HOSPITAL_COMMUNITY)
Admission: EM | Admit: 2012-06-04 | Discharge: 2012-06-07 | DRG: 291 | Disposition: A | Discharge status: Home / Self Care | Payer: Medicare Other | Attending: Internal Medicine | Admitting: Internal Medicine

## 2012-06-04 ENCOUNTER — Emergency Department (HOSPITAL_COMMUNITY): Payer: Medicare Other

## 2012-06-04 DIAGNOSIS — Z794 Long term (current) use of insulin: Secondary | ICD-10-CM

## 2012-06-04 DIAGNOSIS — J310 Chronic rhinitis: Secondary | ICD-10-CM | POA: Diagnosis present

## 2012-06-04 DIAGNOSIS — R0600 Dyspnea, unspecified: Secondary | ICD-10-CM

## 2012-06-04 DIAGNOSIS — I2510 Atherosclerotic heart disease of native coronary artery without angina pectoris: Secondary | ICD-10-CM | POA: Diagnosis present

## 2012-06-04 DIAGNOSIS — E785 Hyperlipidemia, unspecified: Secondary | ICD-10-CM | POA: Diagnosis present

## 2012-06-04 DIAGNOSIS — I5032 Chronic diastolic (congestive) heart failure: Secondary | ICD-10-CM

## 2012-06-04 DIAGNOSIS — Z950 Presence of cardiac pacemaker: Secondary | ICD-10-CM

## 2012-06-04 DIAGNOSIS — N189 Chronic kidney disease, unspecified: Secondary | ICD-10-CM | POA: Diagnosis present

## 2012-06-04 DIAGNOSIS — M4800 Spinal stenosis, site unspecified: Secondary | ICD-10-CM | POA: Diagnosis present

## 2012-06-04 DIAGNOSIS — Z7982 Long term (current) use of aspirin: Secondary | ICD-10-CM

## 2012-06-04 DIAGNOSIS — Z79899 Other long term (current) drug therapy: Secondary | ICD-10-CM

## 2012-06-04 DIAGNOSIS — I129 Hypertensive chronic kidney disease with stage 1 through stage 4 chronic kidney disease, or unspecified chronic kidney disease: Secondary | ICD-10-CM | POA: Diagnosis present

## 2012-06-04 DIAGNOSIS — E119 Type 2 diabetes mellitus without complications: Secondary | ICD-10-CM | POA: Diagnosis present

## 2012-06-04 DIAGNOSIS — I10 Essential (primary) hypertension: Secondary | ICD-10-CM

## 2012-06-04 DIAGNOSIS — I119 Hypertensive heart disease without heart failure: Secondary | ICD-10-CM | POA: Diagnosis present

## 2012-06-04 DIAGNOSIS — H409 Unspecified glaucoma: Secondary | ICD-10-CM | POA: Diagnosis present

## 2012-06-04 DIAGNOSIS — I509 Heart failure, unspecified: Secondary | ICD-10-CM | POA: Diagnosis present

## 2012-06-04 DIAGNOSIS — R0602 Shortness of breath: Secondary | ICD-10-CM | POA: Diagnosis present

## 2012-06-04 DIAGNOSIS — E1142 Type 2 diabetes mellitus with diabetic polyneuropathy: Secondary | ICD-10-CM | POA: Diagnosis present

## 2012-06-04 DIAGNOSIS — N179 Acute kidney failure, unspecified: Secondary | ICD-10-CM | POA: Diagnosis present

## 2012-06-04 DIAGNOSIS — I5033 Acute on chronic diastolic (congestive) heart failure: Principal | ICD-10-CM | POA: Diagnosis present

## 2012-06-04 DIAGNOSIS — J189 Pneumonia, unspecified organism: Secondary | ICD-10-CM | POA: Diagnosis present

## 2012-06-04 HISTORY — DX: Unspecified osteoarthritis, unspecified site: M19.90

## 2012-06-04 HISTORY — DX: Pneumonia, unspecified organism: J18.9

## 2012-06-04 HISTORY — DX: Anemia, unspecified: D64.9

## 2012-06-04 HISTORY — DX: Spinal stenosis, site unspecified: M48.00

## 2012-06-04 LAB — BASIC METABOLIC PANEL
BUN: 23 mg/dL (ref 6–23)
CO2: 29 mEq/L (ref 19–32)
Calcium: 9.5 mg/dL (ref 8.4–10.5)
GFR calc non Af Amer: 38 mL/min — ABNORMAL LOW (ref >90)
Glucose, Bld: 160 mg/dL — ABNORMAL HIGH (ref 70–99)
Potassium: 4 mEq/L (ref 3.5–5.1)

## 2012-06-04 LAB — CBC WITH DIFFERENTIAL/PLATELET
Eosinophils Absolute: 0.3 10*3/uL (ref 0.0–0.7)
Eosinophils Relative: 4 % (ref 0–5)
Hemoglobin: 12.6 g/dL — ABNORMAL LOW (ref 13.0–17.0)
Lymphs Abs: 1.4 10*3/uL (ref 0.7–4.0)
MCH: 31.9 pg (ref 26.0–34.0)
MCV: 93.7 fL (ref 78.0–100.0)
Monocytes Relative: 8 % (ref 3–12)
RBC: 3.95 MIL/uL — ABNORMAL LOW (ref 4.22–5.81)

## 2012-06-04 LAB — POCT I-STAT TROPONIN I

## 2012-06-04 MED ORDER — LEVOFLOXACIN IN D5W 750 MG/150ML IV SOLN
750.0000 mg | Freq: Once | INTRAVENOUS | Status: AC
  Administered 2012-06-05: 750 mg via INTRAVENOUS
  Filled 2012-06-04: qty 150

## 2012-06-04 NOTE — ED Provider Notes (Signed)
Medical screening examination/treatment/procedure(s) were conducted as a shared visit with non-physician practitioner(s) and myself.  I personally evaluated the patient during the encounter   79 yo M, c/o SOB for approx 1 hour after waking up from a nap PTA.  Denies CP/palpitations. EMS noted Sats 90% on R/A on their arrival to scene.  Pt took an extra dose of lasix tonight; has significant hx CHF.  Pt also c/o persistent cough and low grade fevers for the past several days.  Has been taking levaquin with partial relief of symptoms.  Sats low 90's% on R/A on arrival, afebrile with stable HR/BP, A&O, lungs coarse, RRR, abd soft/NT.  BNP elevated, but only slightly more than previous; pt has already taken lasix at home PTA.  EKG with NSR, NS STTW changes, no acute ST elevations. CXR with multifocal pneumonia; will start IV abx for CAP.  Will admit.    Laray Anger, DO 06/05/12 1612

## 2012-06-04 NOTE — ED Provider Notes (Signed)
History     CSN: [id number]  Arrival date & time 06/04/12  2125   First MD Initiated Contact with Patient 06/04/12 2140      Chief Complaint  Patient presents with  . Shortness of Breath    (Consider location/radiation/quality/duration/timing/severity/associated sxs/prior treatment) HPI Comments: Patient is a 79 year old male with a past medical history of CHF, diabetes, renal failure, and hypertension who presents with sudden onset of SOB that occurred while he was sitting in a chair this evening. Patient reports the SOB became progressively worse and spontaneously resolved after about an hour without any intervention. Patient reports being concerned due to his history of CHF. Patient reports associated non productive cough. No aggravating/alleviating factors.    Past Medical History  Diagnosis Date  . CHF (congestive heart failure)   . Diabetes mellitus   . Hypertension   . Hyperlipidemia   . Coronary artery disease   . Spinal stenosis     Past Surgical History  Procedure Date  . Lumbar laminectomy   . Back surgery   . Total knee arthroplasty   . Quadriceps tendon repair   . Cataract extraction   . Insert / replace / remove pacemaker   . Carotid endarterectomy     Family History  Problem Relation Age of Onset  . Hypertension Father     History  Substance Use Topics  . Smoking status: Never Smoker   . Smokeless tobacco: Never Used  . Alcohol Use: No      Review of Systems  Respiratory: Positive for shortness of breath.   All other systems reviewed and are negative.    Allergies  Statins  Home Medications   Current Outpatient Rx  Name  Route  Sig  Dispense  Refill  . AMLODIPINE BESYLATE 10 MG PO TABS   Oral   Take 10 mg by mouth daily.         . ASPIRIN 81 MG PO TABS   Oral   Take 162 mg by mouth 2 (two) times daily.          . DORZOLAMIDE HCL-TIMOLOL MAL 22.3-6.8 MG/ML [place] SOLN   Both Eyes   Place 1 drop into both eyes daily.           [name] FERROUS SULFATE 325 (65 FE) MG PO TABS   Oral   Take 325 mg by mouth daily with breakfast.         . FUROSEMIDE 40 MG PO TABS   Oral   Take 40 mg by mouth daily as needed. Take with 80 mg tablet on Monday, Wednesday and Friday for 120 mg dose and then as needed for weight gain and swelling         . FUROSEMIDE 80 MG PO TABS   Oral   Take 80 mg by mouth daily. Add 40 mg on Monday, Wednesday and Friday for 120 mg dose, 80 mg on all other days         . HYDROCODONE-ACETAMINOPHEN 7.5-500 MG PO TABS   Oral   Take 1 tablet by mouth 4 (four) times daily as needed. For pain   65 tablet   0   . INSULIN GLARGINE 100 UNIT/ML [place] SOLN   Subcutaneous   Inject 12-13 Units into the skin at bedtime.          . INSULIN LISPRO (HUMAN) 100 UNIT/ML [place] SOLN   Subcutaneous   Inject 6-8 Units into the skin 3 (three) times daily before meals.  6 units with breakfast and lunch, 8 units after supper         . ISOSORBIDE MONONITRATE ER 30 MG PO TB24   Oral   Take 30 mg by mouth daily.         . ADULT MULTIVITAMIN W/MINERALS CH   Oral   Take 1 tablet by mouth daily. Diabetic pak by Ashby Dawes from [employer]         . OMEPRAZOLE MAGNESIUM 20 MG PO TBEC   Oral   Take 20 mg by mouth daily.         Frazier Butt OP   Both Eyes   Place 1 drop into both eyes as needed.          [name] POLYETHYLENE GLYCOL 3350 PO PACK   Oral   Take 17 g by mouth daily as needed. For constipation         . POTASSIUM CHLORIDE CRYS ER 20 MEQ PO TBCR   Oral   Take 20 mEq by mouth daily.         [name] VALSARTAN 160 MG PO TABS   Oral   Take 160 mg by mouth 2 (two) times daily.            SpO2 93%  Physical Exam  Nursing note and vitals reviewed. Constitutional: He is oriented to person, place, and time. He appears well-developed and well-nourished. No distress.  HENT:  Head: Normocephalic and atraumatic.  Mouth/Throat: Oropharynx is clear and moist. No oropharyngeal exudate.  Eyes: Conjunctivae normal  and EOM are normal.  Neck: Normal range of motion. Neck supple.  Cardiovascular: Normal rate and regular rhythm.  Exam reveals no gallop and no friction rub.   No murmur heard.      1+ pitting edema of lower extremities  Pulmonary/Chest: Effort normal and breath sounds normal. He has no wheezes. He has no rales. He exhibits no tenderness.       Breath sounds distant.   Abdominal: Soft. He exhibits no distension. There is no tenderness. There is no rebound and no guarding.  Musculoskeletal: Normal range of motion.  Neurological: He is alert and oriented to person, place, and time. Coordination normal.       Speech is goal-oriented. Moves limbs without ataxia.   Skin: Skin is warm and dry. He is not diaphoretic.  Psychiatric: He has a normal mood and affect. His behavior is normal.    ED Course  Procedures (including critical care time)   Date: 06/04/2012  Rate: 90  Rhythm: normal sinus rhythm  QRS Axis: normal  Intervals: QT prolonged  ST/T Wave abnormalities: nonspecific T wave changes  Conduction Disutrbances:first-degree A-V block   Narrative Interpretation:   Old EKG Reviewed: unchanged    Labs Reviewed  CBC WITH DIFFERENTIAL - Abnormal; Notable for the following:    RBC 3.95 (*)     Hemoglobin 12.6 (*)     HCT 37.0 (*)     All other components within normal limits  BASIC METABOLIC PANEL - Abnormal; Notable for the following:    Glucose, Bld 160 (*)     Creatinine, Ser 1.53 (*)     GFR calc non Af Amer 38 (*)     GFR calc Af Amer 44 (*)     All other components within normal limits  PRO B NATRIURETIC PEPTIDE - Abnormal; Notable for the following:    Pro B Natriuretic peptide (BNP) 1507.0 (*)     All other components within normal limits  POCT I-STAT TROPONIN I   Dg Chest 2 View  06/04/2012  *RADIOLOGY REPORT*  Clinical Data: Shortness of breath and cough; history of diabetes and CHF.  CHEST - 2 VIEW  Comparison: Chest radiograph performed 03/09/2012  Findings: The  lungs are well expanded.  Patchy bilateral airspace opacities are noted, particularly at the right midlung zone and left lung base, concerning for multifocal pneumonia.  Mild edema could have a similar appearance, but is considered less likely.  No definite pleural effusion or pneumothorax is seen.  The heart mediastinal silhouette is mildly enlarged.  A pacemaker is seen at the left chest wall, with leads ending at the right atrium and right ventricle.  No acute osseous abnormalities are identified.  IMPRESSION:  1.  Patchy bilateral airspace opacities, particularly at the right midlung zone and left lung base, concerning for multifocal pneumonia.  Mild interstitial edema could have a similar appearance, but is considered less likely. 2.  Mild cardiomegaly noted.   Original Report Authenticated By: Tonia Ghent, M.D.      1. Dyspnea   2. CAP (community acquired pneumonia)   3. CHF, chronic   4. CHF (congestive heart failure)   5. Diabetes mellitus   6. Acute renal failure   7. Chronic diastolic CHF (congestive heart failure)   8. Hypertension       MDM  Patient admitted to Hospitalist service for dyspnea.        Emilia Beck, [place] 06/06/12 [phone]

## 2012-06-04 NOTE — ED Notes (Addendum)
Pt arrives via [facility] from home for difficulty breathing upon awakening from a nap. Pt is wearing Holter monitor following a TIA a month ago. Pt goes in and out of A. Fib. O2 sat on room air was 90 per EMS and 96% on NRB at 15 L. Pt has significant hx including CHF. Pt took an extra dose of his Lasix as prescribed. Pt also reports persistent non-productive cough x several days.

## 2012-06-05 ENCOUNTER — Encounter (HOSPITAL_COMMUNITY): Payer: Self-pay | Admitting: Internal Medicine

## 2012-06-05 DIAGNOSIS — R0989 Other specified symptoms and signs involving the circulatory and respiratory systems: Secondary | ICD-10-CM

## 2012-06-05 DIAGNOSIS — I509 Heart failure, unspecified: Secondary | ICD-10-CM | POA: Diagnosis present

## 2012-06-05 DIAGNOSIS — E119 Type 2 diabetes mellitus without complications: Secondary | ICD-10-CM

## 2012-06-05 DIAGNOSIS — J189 Pneumonia, unspecified organism: Secondary | ICD-10-CM

## 2012-06-05 LAB — CBC
MCHC: 33.7 g/dL (ref 30.0–36.0)
Platelets: 185 10*3/uL (ref 150–400)
RDW: 13.4 % (ref 11.5–15.5)
WBC: 5.5 10*3/uL (ref 4.0–10.5)

## 2012-06-05 LAB — CREATININE, SERUM
GFR calc Af Amer: 46 mL/min — ABNORMAL LOW (ref >90)
GFR calc non Af Amer: 40 mL/min — ABNORMAL LOW (ref >90)

## 2012-06-05 LAB — TROPONIN I: Troponin I: <0.3 ng/mL (ref <0.30)

## 2012-06-05 LAB — GLUCOSE, CAPILLARY: Glucose-Capillary: 54 mg/dL — ABNORMAL LOW (ref 70–99)

## 2012-06-05 MED ORDER — IRBESARTAN 150 MG PO TABS
150.0000 mg | ORAL_TABLET | Freq: Every day | ORAL | Status: DC
  Administered 2012-06-05 – 2012-06-07 (×3): 150 mg via ORAL
  Filled 2012-06-05 (×3): qty 1

## 2012-06-05 MED ORDER — POLYETHYL GLYCOL-PROPYL GLYCOL 0.4-0.3 % OP SOLN: 1.0000 [drp] | Freq: Two times a day (BID) | OPHTHALMIC | Status: DC

## 2012-06-05 MED ORDER — INSULIN ASPART 100 UNIT/ML ~~LOC~~ SOLN
0.0000 [IU] | Freq: Three times a day (TID) | SUBCUTANEOUS | Status: DC
  Administered 2012-06-05: 2 [IU] via SUBCUTANEOUS
  Administered 2012-06-05 – 2012-06-06 (×2): 3 [IU] via SUBCUTANEOUS
  Administered 2012-06-06: 5 [IU] via SUBCUTANEOUS
  Administered 2012-06-06: 2 [IU] via SUBCUTANEOUS
  Administered 2012-06-07: 3 [IU] via SUBCUTANEOUS
  Administered 2012-06-07: 2 [IU] via SUBCUTANEOUS

## 2012-06-05 MED ORDER — ONDANSETRON HCL 4 MG/2ML IJ SOLN: 4.0000 mg | Freq: Four times a day (QID) | INTRAMUSCULAR | Status: DC | PRN

## 2012-06-05 MED ORDER — HEPARIN SODIUM (PORCINE) 5000 UNIT/ML IJ SOLN
5000.0000 [IU] | Freq: Three times a day (TID) | INTRAMUSCULAR | Status: DC
  Administered 2012-06-05 – 2012-06-07 (×7): 5000 [IU] via SUBCUTANEOUS
  Filled 2012-06-05 (×10): qty 1

## 2012-06-05 MED ORDER — FUROSEMIDE 10 MG/ML IJ SOLN
40.0000 mg | Freq: Two times a day (BID) | INTRAMUSCULAR | Status: DC
  Administered 2012-06-05: 40 mg via INTRAVENOUS
  Filled 2012-06-05 (×3): qty 4

## 2012-06-05 MED ORDER — SODIUM CHLORIDE 0.9 % IV SOLN: 250.0000 mL | INTRAVENOUS | Status: DC | PRN

## 2012-06-05 MED ORDER — POLYVINYL ALCOHOL 1.4 % OP SOLN
1.0000 [drp] | Freq: Two times a day (BID) | OPHTHALMIC | Status: DC
  Administered 2012-06-05 – 2012-06-06 (×4): 1 [drp] via OPHTHALMIC
  Filled 2012-06-05 (×2): qty 15

## 2012-06-05 MED ORDER — SODIUM CHLORIDE 0.9 % IJ SOLN
3.0000 mL | INTRAMUSCULAR | Status: DC | PRN
  Administered 2012-06-07: 3 mL via INTRAVENOUS

## 2012-06-05 MED ORDER — FUROSEMIDE 10 MG/ML IJ SOLN
80.0000 mg | Freq: Two times a day (BID) | INTRAMUSCULAR | Status: AC
  Administered 2012-06-05: 80 mg via INTRAVENOUS

## 2012-06-05 MED ORDER — SODIUM CHLORIDE 0.9 % IJ SOLN
3.0000 mL | Freq: Two times a day (BID) | INTRAMUSCULAR | Status: DC
  Administered 2012-06-05 – 2012-06-06 (×3): 3 mL via INTRAVENOUS

## 2012-06-05 MED ORDER — LEVOFLOXACIN IN D5W 750 MG/150ML IV SOLN
750.0000 mg | INTRAVENOUS | Status: DC
  Filled 2012-06-05: qty 150

## 2012-06-05 MED ORDER — ADULT MULTIVITAMIN W/MINERALS CH
1.0000 | ORAL_TABLET | Freq: Every day | ORAL | Status: DC
  Administered 2012-06-05 – 2012-06-07 (×3): 1 via ORAL
  Filled 2012-06-05 (×3): qty 1

## 2012-06-05 MED ORDER — FUROSEMIDE 80 MG PO TABS
80.0000 mg | ORAL_TABLET | Freq: Two times a day (BID) | ORAL | Status: DC
  Administered 2012-06-06 – 2012-06-07 (×3): 80 mg via ORAL
  Filled 2012-06-05 (×5): qty 1

## 2012-06-05 MED ORDER — ISOSORBIDE MONONITRATE ER 30 MG PO TB24
30.0000 mg | ORAL_TABLET | Freq: Every day | ORAL | Status: DC
  Administered 2012-06-05 – 2012-06-07 (×3): 30 mg via ORAL
  Filled 2012-06-05 (×3): qty 1

## 2012-06-05 MED ORDER — ASPIRIN 81 MG PO CHEW
162.0000 mg | CHEWABLE_TABLET | Freq: Two times a day (BID) | ORAL | Status: DC
  Administered 2012-06-05 – 2012-06-07 (×5): 162 mg via ORAL
  Filled 2012-06-05 (×5): qty 2

## 2012-06-05 MED ORDER — NITROGLYCERIN 2 % TD OINT
1.0000 [in_us] | TOPICAL_OINTMENT | Freq: Once | TRANSDERMAL | Status: AC
  Administered 2012-06-05: 1 [in_us] via TOPICAL
  Filled 2012-06-05: qty 1

## 2012-06-05 MED ORDER — INSULIN ASPART 100 UNIT/ML ~~LOC~~ SOLN: 0.0000 [IU] | Freq: Every day | SUBCUTANEOUS | Status: DC

## 2012-06-05 MED ORDER — HYDROCODONE-ACETAMINOPHEN 5-325 MG PO TABS: 1.0000 | ORAL_TABLET | Freq: Four times a day (QID) | ORAL | Status: DC | PRN

## 2012-06-05 MED ORDER — LEVOFLOXACIN IN D5W 750 MG/150ML IV SOLN: 750.0000 mg | INTRAVENOUS | Status: DC

## 2012-06-05 MED ORDER — POLYETHYLENE GLYCOL 3350 17 G PO PACK: 17.0000 g | PACK | Freq: Every day | ORAL | Status: DC | PRN

## 2012-06-05 MED ORDER — MORPHINE SULFATE 2 MG/ML IJ SOLN: 2.0000 mg | INTRAMUSCULAR | Status: DC | PRN

## 2012-06-05 MED ORDER — POTASSIUM CHLORIDE CRYS ER 20 MEQ PO TBCR
20.0000 meq | EXTENDED_RELEASE_TABLET | Freq: Every day | ORAL | Status: DC
  Administered 2012-06-05 – 2012-06-07 (×3): 20 meq via ORAL
  Filled 2012-06-05 (×3): qty 1

## 2012-06-05 MED ORDER — INSULIN ASPART 100 UNIT/ML ~~LOC~~ SOLN
6.0000 [IU] | Freq: Three times a day (TID) | SUBCUTANEOUS | Status: DC
  Administered 2012-06-05 (×2): 6 [IU] via SUBCUTANEOUS

## 2012-06-05 MED ORDER — DOCUSATE SODIUM 100 MG PO CAPS
100.0000 mg | ORAL_CAPSULE | Freq: Two times a day (BID) | ORAL | Status: DC
  Administered 2012-06-05 – 2012-06-07 (×5): 100 mg via ORAL
  Filled 2012-06-05 (×6): qty 1

## 2012-06-05 MED ORDER — ONDANSETRON HCL 4 MG PO TABS: 4.0000 mg | ORAL_TABLET | Freq: Four times a day (QID) | ORAL | Status: DC | PRN

## 2012-06-05 MED ORDER — AMLODIPINE BESYLATE 10 MG PO TABS
10.0000 mg | ORAL_TABLET | Freq: Every day | ORAL | Status: DC
  Administered 2012-06-05 – 2012-06-07 (×3): 10 mg via ORAL
  Filled 2012-06-05 (×3): qty 1

## 2012-06-05 MED ORDER — ASPIRIN 81 MG PO TABS: 162.0000 mg | ORAL_TABLET | Freq: Two times a day (BID) | ORAL | Status: DC

## 2012-06-05 MED ORDER — SODIUM CHLORIDE 0.9 % IJ SOLN
3.0000 mL | Freq: Two times a day (BID) | INTRAMUSCULAR | Status: DC
  Administered 2012-06-05: 3 mL via INTRAVENOUS

## 2012-06-05 MED ORDER — LATANOPROST 0.005 % OP SOLN
1.0000 [drp] | Freq: Every day | OPHTHALMIC | Status: DC
  Administered 2012-06-05 – 2012-06-06 (×2): 1 [drp] via OPHTHALMIC
  Filled 2012-06-05: qty 2.5

## 2012-06-05 MED ORDER — TAFLUPROST 0.0015 % OP SOLN: 1.0000 [drp] | Freq: Every day | OPHTHALMIC | Status: DC

## 2012-06-05 MED ORDER — FERROUS SULFATE 325 (65 FE) MG PO TABS
325.0000 mg | ORAL_TABLET | Freq: Every day | ORAL | Status: DC
  Administered 2012-06-05 – 2012-06-07 (×3): 325 mg via ORAL
  Filled 2012-06-05 (×4): qty 1

## 2012-06-05 MED ORDER — INSULIN GLARGINE 100 UNIT/ML ~~LOC~~ SOLN
13.0000 [IU] | Freq: Every day | SUBCUTANEOUS | Status: DC
  Administered 2012-06-05: 13 [IU] via SUBCUTANEOUS

## 2012-06-05 NOTE — Progress Notes (Signed)
Admitted pt to rm [id number] from ED via stretcher, oriented to room, call bell placed within reach, admission assessment done, orders carried out. Will continue to monitor.

## 2012-06-05 NOTE — H&P (Signed)
[facility] History and Physical  [name] RUE:[id number] DOB: July 11, 1918    PCP:   [name], MD   Chief Complaint: Acute SOB.  HPI: [name] is an 79 y.o. male, a retired Careers adviser, with hx of CHF, DM, HTN, ?diastolic dysFx with EF 50%, Hyperlipidemia, s/p coratid endarterectomy, last admitted in 10/13 with CHF felt at that time to be fluid overload, and PNA, presents again to the ER with acute onset of SOB lasting about one hour.  He did not have orthopnea or PND this time around.  He denied any CP, fever or chills.  He did have intermittent productive cough.  His legs have been swelling as well, though he has been on Norvasc also.  He took Levoquin and extra oral lasix, and presented to the ER.  Evaluation in the ER included a CXR with vascular congestion and mulifocal infiltates, BNP of 1507, with Cr of 1.3 and normal WBC and no fever.  His EKG showed no ischemic changes and his initial troponin was negative.  Hospitalist was asked to admit him for CHF and CAP.  Rewiew of Systems:  Constitutional: Negative for malaise, fever and chills. No significant weight loss or weight gain Eyes: Negative for eye pain, redness and discharge, diplopia, visual changes, or flashes of light. ENMT: Negative for ear pain, hoarseness, nasal congestion, sinus pressure and sore throat. No headaches; tinnitus, drooling, or problem swallowing. Cardiovascular: Negative for palpitations, diaphoresis, . ; No orthopnea, PND Respiratory: Negative for  hemoptysis, wheezing and stridor. No pleuritic chestpain. Gastrointestinal: Negative for nausea, vomiting, diarrhea, constipation, abdominal pain, melena, blood in stool, hematemesis, jaundice and rectal bleeding.    Genitourinary: Negative for frequency, dysuria, incontinence,flank pain and hematuria; Musculoskeletal: Negative for back pain and neck pain. Negative for swelling and trauma.;  Skin: . Negative for pruritus, rash, abrasions, bruising  and skin lesion.; ulcerations Neuro: Negative for headache, lightheadedness and neck stiffness. Negative for weakness, altered level of consciousness , altered mental status, extremity weakness, burning feet, involuntary movement, seizure and syncope.  Psych: negative for anxiety, depression, insomnia, tearfulness, panic attacks, hallucinations, paranoia, suicidal or homicidal ideation    Past Medical History  Diagnosis Date  . CHF (congestive heart failure)   . Diabetes mellitus   . Hypertension   . Hyperlipidemia   . Coronary artery disease   . Spinal stenosis     Past Surgical History  Procedure Date  . Lumbar laminectomy   . Back surgery   . Total knee arthroplasty   . Quadriceps tendon repair   . Cataract extraction   . Insert / replace / remove pacemaker   . Carotid endarterectomy     Medications:  HOME MEDS: Prior to Admission medications   Medication Sig Start Date End Date Taking? Authorizing Provider  amLODipine (NORVASC) 10 MG tablet Take 10 mg by mouth daily.   Yes Historical Provider, MD  aspirin 81 MG tablet Take 162 mg by mouth 2 (two) times daily.    Yes Historical Provider, MD  ferrous sulfate 325 (65 FE) MG tablet Take 325 mg by mouth daily with breakfast.   Yes Historical Provider, MD  furosemide (LASIX) 40 MG tablet Take 40 mg by mouth daily as needed. Take with 80 mg tablet on Monday, Wednesday and Friday for 120 mg dose and then as needed for weight gain and swelling   Yes Historical Provider, MD  furosemide (LASIX) 80 MG tablet Take 80 mg by mouth daily. Add 40 mg on Monday, Wednesday  and Friday for 120 mg dose, 80 mg on all other days   Yes Historical Provider, MD  HYDROcodone-acetaminophen (LORTAB) 7.5-500 MG per tablet Take 1 tablet by mouth 4 (four) times daily as needed. For pain 02/11/12  Yes Dorothea Ogle, MD  insulin glargine (LANTUS) 100 UNIT/ML injection Inject 13 Units into the skin at bedtime.    Yes Historical Provider, MD  insulin lispro  (HUMALOG) 100 UNIT/ML injection Inject 7-9 Units into the skin 3 (three) times daily before meals. 7 units with breakfast and lunch, 9 units after supper   Yes Historical Provider, MD  isosorbide mononitrate (IMDUR) 30 MG 24 hr tablet Take 30 mg by mouth daily.   Yes Historical Provider, MD  Multiple Vitamin (MULTIVITAMIN WITH MINERALS) TABS Take 1 tablet by mouth daily. Diabetic pak by Ashby Dawes from [employer]   Yes Historical Provider, MD  Polyethyl Glycol-Propyl Glycol (SYSTANE OP) Place 1 drop into both eyes 2 (two) times daily as needed. For dry eyes   Yes Historical Provider, MD  polyethylene glycol (MIRALAX / GLYCOLAX) packet Take 17 g by mouth daily as needed. For constipation   Yes Historical Provider, MD  potassium chloride SA (K-DUR,KLOR-CON) 20 MEQ tablet Take 20 mEq by mouth daily.   Yes Historical Provider, MD  Tafluprost (ZIOPTAN) 0.0015 % SOLN Place 1 drop into both eyes at bedtime. ONLY PRESERVATIVE FREE   Yes Historical Provider, MD  valsartan (DIOVAN) 160 MG tablet Take 160 mg by mouth every morning.    Yes Historical Provider, MD     Allergies:  Allergies  Allergen Reactions  . Statins Other (See Comments)    rhabdomyolisis    Social History:   reports that he has never smoked. He has never used smokeless tobacco. He reports that he does not drink alcohol or use illicit drugs.  Family History: Family History  Problem Relation Age of Onset  . Hypertension Father      Physical Exam: Filed Vitals:   06/04/12 2238 06/04/12 2245 06/04/12 2300 06/05/12 0053  BP:  149/73 164/73 151/57  Pulse: 79 83 74 70  Temp:    101.1 F (38.4 C)  TempSrc:    Rectal  Resp: 21 23 21 20   SpO2: 91% 94% 92% 97%   Blood pressure 151/57, pulse 70, temperature 101.1 F (38.4 C), temperature source Rectal, resp. rate 20, SpO2 97.00%.  GEN:  Pleasant  patient lying in the stretcher in no acute distress; cooperative with exam. PSYCH:  alert and oriented x4; does not appear anxious or  depressed; affect is appropriate. HEENT: Mucous membranes pink and anicteric; PERRLA; EOM intact; no cervical lymphadenopathy nor thyromegaly or carotid bruit; no JVD; There were no stridor. Neck is very supple. Breasts:: Not examined CHEST WALL: No tenderness CHEST: Normal respiration, bibasilar rales, with mild wheezing. HEART: Regular rate and rhythm.  There are no murmur, rub, or gallops.   BACK: No kyphosis or scoliosis; no CVA tenderness ABDOMEN: soft and non-tender; no masses, no organomegaly, normal abdominal bowel sounds; no pannus; no intertriginous candida. There is no rebound and no distention. Rectal Exam: Not done EXTREMITIES: No bone or joint deformity; age-appropriate arthropathy of the hands and knees; 2+ edema; no ulcerations.  There is no calf tenderness. Genitalia: not examined PULSES: 2+ and symmetric SKIN: Normal hydration no rash or ulceration CNS: Cranial nerves 2-12 grossly intact no focal lateralizing neurologic deficit.  Speech is fluent; uvula elevated with phonation, facial symmetry and tongue midline. DTR are normal bilaterally, cerebella exam is  intact, barbinski is negative and strengths are equaled bilaterally.  No sensory loss.   Labs on Admission:  Basic Metabolic Panel:  Lab 06/04/12 1610  NA 135  K 4.0  CL 96  CO2 29  GLUCOSE 160*  BUN 23  CREATININE 1.53*  CALCIUM 9.5  MG --  PHOS --   Liver Function Tests: No results found for this basename: AST:5,ALT:5,ALKPHOS:5,BILITOT:5,PROT:5,ALBUMIN:5 in the last 168 hours No results found for this basename: LIPASE:5,AMYLASE:5 in the last 168 hours No results found for this basename: AMMONIA:5 in the last 168 hours CBC:  Lab 06/04/12 2149  WBC 6.4  NEUTROABS 4.2  HGB 12.6*  HCT 37.0*  MCV 93.7  PLT 171   Cardiac Enzymes: No results found for this basename: CKTOTAL:5,CKMB:5,CKMBINDEX:5,TROPONINI:5 in the last 168 hours  CBG: No results found for this basename: GLUCAP:5 in the last 168  hours   Radiological Exams on Admission: Dg Chest 2 View  06/04/2012  *RADIOLOGY REPORT*  Clinical Data: Shortness of breath and cough; history of diabetes and CHF.  CHEST - 2 VIEW  Comparison: Chest radiograph performed 03/09/2012  Findings: The lungs are well expanded.  Patchy bilateral airspace opacities are noted, particularly at the right midlung zone and left lung base, concerning for multifocal pneumonia.  Mild edema could have a similar appearance, but is considered less likely.  No definite pleural effusion or pneumothorax is seen.  The heart mediastinal silhouette is mildly enlarged.  A pacemaker is seen at the left chest wall, with leads ending at the right atrium and right ventricle.  No acute osseous abnormalities are identified.  IMPRESSION:  1.  Patchy bilateral airspace opacities, particularly at the right midlung zone and left lung base, concerning for multifocal pneumonia.  Mild interstitial edema could have a similar appearance, but is considered less likely. 2.  Mild cardiomegaly noted.   Original Report Authenticated By: Tonia Ghent, M.D.     EKG: Independently reviewed. No acute ischemic changes.   Assessment/Plan Present on Admission:  . SOB (shortness of breath) . Diabetes mellitus . Hypertension . CHF (congestive heart failure) . CAP (community acquired pneumonia)   PLAN:  Will admit Dr [name] for at least acute on chronic CHF.  I wonder if he has ischemic CMP as well.  He did have increase risks with age, HTN and hyperlipidemia and DM, along with known coratid disease, so it is likely he has obtructive CAD as well.  Will defer any ischemic work up to his cardiologist.  He saw Dr Delaine Lame so please consult him for further recommendation.   In the interim, I will continue to diurese him with IV Lasix, and give NTP to his chest wall.  Will continue his home meds.  For his CAP, he was started on Levaquin, and will continue it IV.  For his DM, will continue his meds, place  on carb modified diet, and use extra ISS to cover.  He is stable, full code, and will be admitted to telemetry under [facility] service.  Thank you for allowing me to partake in the care of your nice patient.  Other plans as per orders.  Code Status: FULL Unk Lightning, MD. [facility] Pager [phone] 7pm to 7am.  06/05/2012, 1:49 AM

## 2012-06-05 NOTE — Progress Notes (Signed)
79 y/o male with PMH significant for HTN, HLD, DM, CHF (diastolic, EF 50-55%; Jan/2013); came to the hospital with non productive cough, leg swelling and increase SOB. Patient admitted after midnight, please refer to Dr. Irwin Brakeman note from admission for further info/details regarding acute illness and plan of care.  Plan: -continue iv lasix -continue antibiotics -repeat CXR -start IS -continue SSI, ASA and statins -Will follow cardiology recommendations.  Madeleine Fenn [phone]

## 2012-06-05 NOTE — Progress Notes (Signed)
CRITICAL VALUE ALERT  Critical value received: CBG=61  Date of notification: 06/05/12  Time of notification: 1600  Critical value read back:yes  Nurse who received alert:  Junie Panning RN  MD notified (1st page): [place]  Time of first page:  1700  MD notified (2nd page):  Time of second page:  Responding MD: Gwenlyn Perking  Time MD responded:  1730   Patient CBG was 61. Re-check was 54. The re-check after that was 90.

## 2012-06-05 NOTE — Progress Notes (Addendum)
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 On January 16 he called our office because of a sudden 3 pound weight gain. We increased his usual furosemide dose from 80 mg each a.m. with 40 mg on Monday, Wednesday, and Friday p.m. to 80 mg twice daily. He was to continue this patent until his weight returned to less than 200 pounds. We had not heard back from him but he assures me that his weight came back down after several doses There was no chest pain. No palpitations. The only other issue that we have been struggling with this whether he is having episodes of paroxysmal atrial fibrillation. His most recent pacemaker telemetry suggested the possibility of atrial high rates on rare occasion that could represent atrial fibrillation.  TELEMETRY:  AV sequential pacing: Filed Vitals:   06/05/12 0053 06/05/12 0227 06/05/12 0601 06/05/12 1057  BP: 151/57 123/104 119/55 137/65  Pulse: 70 70 70 70  Temp: 101.1 F (38.4 C) 99.7 F (37.6 C) 98.5 F (36.9 C)   TempSrc: Rectal Oral Oral   Resp: 20 18 18    Height:  5\' 7"  (1.702 m)    Weight:  92.307 kg (203 lb 8 oz)    SpO2: 97% 99% 99%     Intake/Output Summary (Last 24 hours) at 06/05/12 1306 Last data filed at 06/05/12 1058  Gross per 24 hour  Intake    363 ml  Output    200 ml  Net    163 ml   No significant negativity in the patient's overall input and output chart   LABS: Basic Metabolic Panel:  Basename 06/05/12 0231 06/04/12 2149  NA -- 135  K -- 4.0  CL -- 96  CO2 -- 29  GLUCOSE -- 160*  BUN -- 23  CREATININE 1.48* 1.53*  CALCIUM -- 9.5  MG -- --  PHOS -- --   CBC:  Basename 06/05/12 0231 06/04/12 2149  WBC 5.5 6.4  NEUTROABS -- 4.2  HGB 11.8* 12.6*  HCT 35.0* 37.0*  MCV 93.3 93.7  PLT 185 171   Cardiac Enzymes:  Basename 06/05/12 0911 06/05/12 0224  CKTOTAL -- --  CKMB -- --  CKMBINDEX -- --  TROPONINI <0.30  <0.30   ECHOCARDIOGRAM:  LV EF: 50% - 55%  ------------------------------------------------------------ Indications: CHF - left ventricular 428.1.  ------------------------------------------------------------ History: PMH: Probablepneumonia on chest x-ray. History of congestive heart failure with hypokinesis of the basal inferior wall. Non productive cough. Fever. Risk factors: Hypertension. Diabetes mellitus. Dyslipidemia.  ------------------------------------------------------------ Study Conclusions  - Left ventricle: The cavity size was normal. There was severe focal basal and moderate concentric hypertrophy. Systolic function was normal. The estimated ejection fraction was in the range of 50% to 55%. Features are consistent with a pseudonormal left ventricular filling pattern, with concomitant abnormal relaxation and increased filling pressure (grade 2 diastolic dysfunction). - Aortic valve: Severe diffuse thickening and calcification. There was mild stenosis. Valve area: 1.48cm^2(VTI). Valve area: 1.53cm^2 (Vmax). - Left atrium: The atrium was mildly dilated.    Radiology/Studies:  Chest x-ray demonstrates bibasilar patchy infiltrates, most likely related to CHF  Physical Exam: Blood pressure 137/65, pulse 70, temperature 98.5 F (36.9 C), temperature source Oral, resp. rate 18, height 5\' 7"  (1.702 m), weight 92.307 kg (203 lb 8 oz), SpO2 99.00%. Weight change:    Basilar crackles. No wheezes.  Cardiac exam reveals a one of 6 systolic murmur. No  diastolic murmur.  There is no peripheral edema.  ASSESSMENT:  1. Acute on chronic diastolic heart failure. Recent LVEF 50-55%  2. Chronic kidney disease with baseline creatinine in the 1.35-1.4 range.  3. There is no history of coronary artery disease and there is no evidence of ischemia on this presentation.  4. Possible prior episodes of atrial fibrillation although never documented. 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  Plan:  1. No ischemic evaluation is indicated  2. We will need a slightly more aggressive diuretic regimen at discharge. Perhaps a discharge Lasix dose should be 80 mg twice daily or we should consider adding Aldactone to his prior furosemide dose.  3. He needs to have input and output as well as weights recorded daily  Signed, Lesleigh Noe 06/05/2012, 1:06 PM

## 2012-06-05 NOTE — Progress Notes (Signed)
Utilization Review Completed.   Kell Ferris, RN, BSN Nurse Case Manager  [phone]  

## 2012-06-06 ENCOUNTER — Inpatient Hospital Stay (HOSPITAL_COMMUNITY): Payer: Medicare Other

## 2012-06-06 DIAGNOSIS — I10 Essential (primary) hypertension: Secondary | ICD-10-CM

## 2012-06-06 DIAGNOSIS — I5032 Chronic diastolic (congestive) heart failure: Secondary | ICD-10-CM

## 2012-06-06 DIAGNOSIS — R0602 Shortness of breath: Secondary | ICD-10-CM

## 2012-06-06 LAB — BASIC METABOLIC PANEL
BUN: 22 mg/dL (ref 6–23)
CO2: 32 mEq/L (ref 19–32)
Chloride: 97 mEq/L (ref 96–112)
Creatinine, Ser: 1.4 mg/dL — ABNORMAL HIGH (ref 0.50–1.35)
GFR calc Af Amer: 49 mL/min — ABNORMAL LOW (ref >90)
Glucose, Bld: 151 mg/dL — ABNORMAL HIGH (ref 70–99)
Potassium: 3.5 mEq/L (ref 3.5–5.1)

## 2012-06-06 LAB — GLUCOSE, CAPILLARY: Glucose-Capillary: 184 mg/dL — ABNORMAL HIGH (ref 70–99)

## 2012-06-06 MED ORDER — LEVOFLOXACIN 500 MG PO TABS
500.0000 mg | ORAL_TABLET | Freq: Every day | ORAL | Status: DC
  Administered 2012-06-06 – 2012-06-07 (×2): 500 mg via ORAL
  Filled 2012-06-06 (×2): qty 1

## 2012-06-06 MED ORDER — INSULIN GLARGINE 100 UNIT/ML ~~LOC~~ SOLN
15.0000 [IU] | Freq: Every day | SUBCUTANEOUS | Status: DC
  Administered 2012-06-06: 12 [IU] via SUBCUTANEOUS

## 2012-06-06 NOTE — Progress Notes (Addendum)
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 He denies dyspnea. He had a 1.2 L diuresis with 80 mg of IV Lasix last p.m.  TELEMETRY:  AV pacing. No atrial fibrillation: Filed Vitals:   06/05/12 1500 06/05/12 2023 06/06/12 0218 06/06/12 0847  BP: 134/68 136/88 143/81 154/74  Pulse: 72 74 76 73  Temp: 97.8 F (36.6 C) 98.1 F (36.7 C) 98 F (36.7 C)   TempSrc: Oral Oral Oral   Resp: 17 18 18    Height:      Weight:   89.9 kg (198 lb 3.1 oz)   SpO2: 98% 98% 96%     Intake/Output Summary (Last 24 hours) at 06/06/12 1024 Last data filed at 06/06/12 0846  Gross per 24 hour  Intake    723 ml  Output   1201 ml  Net   -478 ml    LABS: Basic Metabolic Panel:  Basename 06/06/12 0530 06/05/12 0231 06/04/12 2149  NA 137 -- 135  K 3.5 -- 4.0  CL 97 -- 96  CO2 32 -- 29  GLUCOSE 151* -- 160*  BUN 22 -- 23  CREATININE 1.40* 1.48* --  CALCIUM 9.2 -- 9.5  MG -- -- --  PHOS -- -- --   CBC:  Basename 06/05/12 0231 06/04/12 2149  WBC 5.5 6.4  NEUTROABS -- 4.2  HGB 11.8* 12.6*  HCT 35.0* 37.0*  MCV 93.3 93.7  PLT 185 171   Cardiac Enzymes:  Basename 06/05/12 1555 06/05/12 0911 06/05/12 0224  CKTOTAL -- -- --  CKMB -- -- --  CKMBINDEX -- -- --  TROPONINI <0.30 <0.30 <0.30   BNP    Component Value Date/Time   PROBNP 1507.0* 06/04/2012 2150   Radiology/Studies:  No new data  Physical Exam: Blood pressure 154/74, pulse 73, temperature 98 F (36.7 C), temperature source Oral, resp. rate 18, height 5\' 7"  (1.702 m), weight 89.9 kg (198 lb 3.1 oz), SpO2 96.00%. Weight change: -2.407 kg (-5 lb 4.9 oz)   No gallop or rub.  Clear lung fields.  No edema .  ASSESSMENT:  1. Acute on chronic diastolic heart failure, improved  2. Possible pulmonary infection  3. Elderly and frail  4. Still trying to determine if the patient has paroxysmal atrial fibrillation.   Plan:  1. He is a candidate for discharge today  or in a.m. We need to make sure we have the diuretic regimen stable. He has been taking 80 mg of furosemide each morning and 40 mg of furosemide, in the evenings, 3 times per week.  I would recommend 80 mg each morning and 40 mg each evening at discharge. May need 80 mg BID but we can decide at OV eraly next week depending on course.  2. We will see him early next week in our clinic. Please see the discharge followup instructions .  Selinda Eon 06/06/2012, 10:24 AM

## 2012-06-06 NOTE — Progress Notes (Signed)
[facility] PROGRESS NOTE  [name]:[id number] DOB: February 09, 1919 DOA: 06/04/2012 PCP: [name], MD  Assessment/Plan: 1-shortness of breath: Secondary to acute on chronic diastolic heart failure and bronchitis/early pneumonia. -Continue antibiotics (Levaquin), will transition to by mouth. -Continue Lasix (does adjusted and transition to by mouth by cardiology) -Continue daily weight and also close intake and output. -If patient remains stable continue breathing comfortable with transition medications to peals by mouth we'll discharge home in the morning.  2-hypertension: Continue current medication. Blood pressure stable.  3-hyperlipidemia: Continue statins.  4-Diabetes: Continue sliding scale insulin and also Lantus at bedtime  5-glaucoma: Continue latanoprost.  DVT: Heparin  Code Status:  full code Family Communication: No family at bedside Disposition Plan: Home tomorrow. Diabetic dose adjusted and changed to by mouth. If patient continue with good diuresis and no shortness of breath will discharge home   Consultants:  Cardiology (Dr. Katrinka Blazing, [facility] cardiology)  Procedures:  See below for x-ray reports  Antibiotics:  Levaquin  HPI/Subjective: Afebrile, no chest pain, no shortness of breath. Breathing better overall. No complaints  Objective: Filed Vitals:   06/05/12 2023 06/06/12 0218 06/06/12 0847 06/06/12 1337  BP: 136/88 143/81 154/74 116/73  Pulse: 74 76 73 74  Temp: 98.1 F (36.7 C) 98 F (36.7 C)  98 F (36.7 C)  TempSrc: Oral Oral  Oral  Resp: 18 18  20   Height:      Weight:  89.9 kg (198 lb 3.1 oz)    SpO2: 98% 96%  96%    Intake/Output Summary (Last 24 hours) at 06/06/12 1643 Last data filed at 06/06/12 1307  Gross per 24 hour  Intake    720 ml  Output   1201 ml  Net   -481 ml   Filed Weights   06/05/12 0227 06/06/12 0218  Weight: 92.307 kg (203 lb 8 oz) 89.9 kg (198 lb 3.1 oz)    Exam:   General:  No acute  distress, afebrile, cooperative to examination, able to speak in full sentences  Cardiovascular: No chest pain, no murmurs, no gallops, no rubs, S1 and S2 appreciate on exam  Respiratory: Good air movement bilaterally, clear to auscultation  Abdomen: Soft, nontender, nondistended, positive bowel sounds.  Extremities: Trace edema especially affecting his ankles bilaterally  Neuro: Nonfocal deficit.  Data Reviewed: Basic Metabolic Panel:  Lab 06/06/12 8119 06/05/12 0231 06/04/12 2149  NA 137 -- 135  K 3.5 -- 4.0  CL 97 -- 96  CO2 32 -- 29  GLUCOSE 151* -- 160*  BUN 22 -- 23  CREATININE 1.40* 1.48* 1.53*  CALCIUM 9.2 -- 9.5  MG -- -- --  PHOS -- -- --   CBC:  Lab 06/05/12 0231 06/04/12 2149  WBC 5.5 6.4  NEUTROABS -- 4.2  HGB 11.8* 12.6*  HCT 35.0* 37.0*  MCV 93.3 93.7  PLT 185 171   Cardiac Enzymes:  Lab 06/05/12 1555 06/05/12 0911 06/05/12 0224  CKTOTAL -- -- --  CKMB -- -- --  CKMBINDEX -- -- --  TROPONINI <0.30 <0.30 <0.30   BNP (last 3 results)  Basename 06/04/12 2150 02/29/12 0538 02/09/12 1405  PROBNP 1507.0* 1277.0* 2155.0*   CBG:  Lab 06/06/12 1617 06/06/12 1107 06/06/12 0615 06/05/12 2105 06/05/12 1654  GLUCAP 207* 184* 148* 207* 90    Studies: Dg Chest 2 View  06/06/2012  *RADIOLOGY REPORT*  Clinical Data: Weakness, shortness of breath, vascular congestion, follow up  CHEST - 2 VIEW  Comparison: 06/04/2012  Findings: Left  subclavian sequential transvenous pacemaker leads project at right atrium and right ventricle. Minimal enlargement of cardiac silhouette. Mediastinal contours and pulmonary vascularity normal. Slightly improved pulmonary infiltrates. Probable small left pleural effusion. No pneumothorax or acute osseous findings.  IMPRESSION: Slightly improved pulmonary infiltrates. Minimal enlargement of cardiac silhouette and probable small left pleural effusion.   Original Report Authenticated By: Ulyses Southward, M.D.    Dg Chest 2 View  06/04/2012   *RADIOLOGY REPORT*  Clinical Data: Shortness of breath and cough; history of diabetes and CHF.  CHEST - 2 VIEW  Comparison: Chest radiograph performed 03/09/2012  Findings: The lungs are well expanded.  Patchy bilateral airspace opacities are noted, particularly at the right midlung zone and left lung base, concerning for multifocal pneumonia.  Mild edema could have a similar appearance, but is considered less likely.  No definite pleural effusion or pneumothorax is seen.  The heart mediastinal silhouette is mildly enlarged.  A pacemaker is seen at the left chest wall, with leads ending at the right atrium and right ventricle.  No acute osseous abnormalities are identified.  IMPRESSION:  1.  Patchy bilateral airspace opacities, particularly at the right midlung zone and left lung base, concerning for multifocal pneumonia.  Mild interstitial edema could have a similar appearance, but is considered less likely. 2.  Mild cardiomegaly noted.   Original Report Authenticated By: Tonia Ghent, M.D.     Scheduled Meds:   . amLODipine  10 mg Oral Daily  . aspirin  162 mg Oral BID  . docusate sodium  100 mg Oral BID  . ferrous sulfate  325 mg Oral Q breakfast  . furosemide  80 mg Oral BID  . heparin  5,000 Units Subcutaneous Q8H  . insulin aspart  0-15 Units Subcutaneous TID WC  . insulin glargine  13 Units Subcutaneous QHS  . irbesartan  150 mg Oral Daily  . isosorbide mononitrate  30 mg Oral Daily  . latanoprost  1 drop Both Eyes QHS  . levofloxacin  500 mg Oral Daily  . multivitamin with minerals  1 tablet Oral Daily  . polyvinyl alcohol  1 drop Both Eyes BID  . potassium chloride SA  20 mEq Oral Daily  . sodium chloride  3 mL Intravenous Q12H  . sodium chloride  3 mL Intravenous Q12H   Continuous Infusions:   Active Problems:  SOB (shortness of breath)  Hypertension  Diabetes mellitus  CHF (congestive heart failure)  CAP (community acquired pneumonia)    Time spent: >30  minutes    [name]  [facility] Pager [phone]. If 8PM-8AM, please contact night-coverage at [email or web address], password [facility] 06/06/2012, 4:43 PM  LOS: 2 days

## 2012-06-06 NOTE — ED Provider Notes (Signed)
Medical screening examination/treatment/procedure(s) were conducted as a shared visit with non-physician practitioner(s) and myself.  I personally evaluated the patient during the encounter See my previous note.  Laray Anger, DO 06/06/12 1558

## 2012-06-07 DIAGNOSIS — N179 Acute kidney failure, unspecified: Secondary | ICD-10-CM

## 2012-06-07 LAB — BASIC METABOLIC PANEL
Calcium: 9.5 mg/dL (ref 8.4–10.5)
GFR calc Af Amer: 46 mL/min — ABNORMAL LOW (ref >90)
GFR calc non Af Amer: 40 mL/min — ABNORMAL LOW (ref >90)
Glucose, Bld: 136 mg/dL — ABNORMAL HIGH (ref 70–99)
Potassium: 3.7 mEq/L (ref 3.5–5.1)
Sodium: 139 mEq/L (ref 135–145)

## 2012-06-07 LAB — GLUCOSE, CAPILLARY: Glucose-Capillary: 122 mg/dL — ABNORMAL HIGH (ref 70–99)

## 2012-06-07 MED ORDER — LEVOFLOXACIN 500 MG PO TABS: 500.0000 mg | ORAL_TABLET | Freq: Every day | ORAL | Status: DC

## 2012-06-07 MED ORDER — LORATADINE 10 MG PO TABS: 10.0000 mg | ORAL_TABLET | Freq: Every day | ORAL | Status: DC

## 2012-06-07 MED ORDER — GUAIFENESIN ER 600 MG PO TB12: 600.0000 mg | ORAL_TABLET | Freq: Two times a day (BID) | ORAL | Status: DC

## 2012-06-07 MED ORDER — FUROSEMIDE 80 MG PO TABS: 80.0000 mg | ORAL_TABLET | Freq: Every day | ORAL | Status: DC

## 2012-06-07 MED ORDER — FUROSEMIDE 40 MG PO TABS: 40.0000 mg | ORAL_TABLET | Freq: Every day | ORAL | Status: DC

## 2012-06-07 MED ORDER — LEVOFLOXACIN 750 MG PO TABS: 750.0000 mg | ORAL_TABLET | ORAL | Status: DC

## 2012-06-07 NOTE — Progress Notes (Signed)
Subjective:  Ready to go home, feels better. Family present.   Objective:  Vital Signs in the last 24 hours: Temp:  [98 F (36.7 C)-98.2 F (36.8 C)] 98 F (36.7 C) (01/25 0621) Pulse Rate:  [70-78] 78  (01/25 0621) Resp:  [18-20] 18  (01/25 0621) BP: (116-149)/(53-74) 141/62 mmHg (01/25 0936) SpO2:  [96 %-98 %] 98 % (01/25 0621) Weight:  [88.724 kg (195 lb 9.6 oz)] 88.724 kg (195 lb 9.6 oz) (01/25 0621)  Intake/Output from previous day: 01/24 0701 - 01/25 0700 In: 963 [P.O.:960; I.V.:3] Out: 350 [Urine:350]   Physical Exam: General: Well developed, well nourished, in no acute distress. Head:  Normocephalic and atraumatic. Lungs:Minimal crackles at bases. Heart: Normal S1 and S2.  No murmur, rubs or gallops.  Abdomen: soft, non-tender, positive bowel sounds. Extremities: No clubbing or cyanosis. No edema. Neurologic: Alert and oriented x 3.    Lab Results:  Basename 06/05/12 0231 06/04/12 2149  WBC 5.5 6.4  HGB 11.8* 12.6*  PLT 185 171    Basename 06/07/12 0544 06/06/12 0530  NA 139 137  K 3.7 3.5  CL 98 97  CO2 31 32  GLUCOSE 136* 151*  BUN 23 22  CREATININE 1.47* 1.40*    Basename 06/05/12 1555 06/05/12 0911  TROPONINI <0.30 <0.30   Hepatic Function Panel No results found for this basename: PROT,ALBUMIN,AST,ALT,ALKPHOS,BILITOT,BILIDIR,IBILI in the last 72 hours No results found for this basename: CHOL in the last 72 hours No results found for this basename: PROTIME in the last 72 hours  Imaging: Dg Chest 2 View  06/06/2012  *RADIOLOGY REPORT*  Clinical Data: Weakness, shortness of breath, vascular congestion, follow up  CHEST - 2 VIEW  Comparison: 06/04/2012  Findings: Left subclavian sequential transvenous pacemaker leads project at right atrium and right ventricle. Minimal enlargement of cardiac silhouette. Mediastinal contours and pulmonary vascularity normal. Slightly improved pulmonary infiltrates. Probable small left pleural effusion. No pneumothorax  or acute osseous findings.  IMPRESSION: Slightly improved pulmonary infiltrates. Minimal enlargement of cardiac silhouette and probable small left pleural effusion.   Original Report Authenticated By: Ulyses Southward, M.D.    Personally viewed.   Telemetry: A paced Personally viewed.   Assessment/Plan:  Active Problems:  SOB (shortness of breath)  Hypertension  Diabetes mellitus  CHF (congestive heart failure)  CAP (community acquired pneumonia)   1. Acute diastolic HF - much improved. Will need lasix 80 PO BID on DC. He has close follow up with Dr. Katrinka Blazing. Trop normal. OK with DC.   2. PACER - functioning well.   3. CKD - stable. 1.4 range.   [name], [name] 06/07/2012, 12:17 PM

## 2012-06-07 NOTE — Progress Notes (Signed)
Patient discharged home. Discharge instructions read. Patient and Caregiver verbalize understanding.

## 2012-06-07 NOTE — Progress Notes (Signed)
ANTIBIOTIC CONSULT NOTE - FOLLOW UP  Pharmacy Consult for Levaquin Indication: pneumonia  Allergies  Allergen Reactions  . Statins Other (See Comments)    rhabdomyolisis    Patient Measurements: Height: 5\' 7"  (170.2 cm) Weight: 195 lb 9.6 oz (88.724 kg) (scale c) IBW/kg (Calculated) : 66.1    Vital Signs: Temp: 98 F (36.7 C) (01/25 0621) Temp src: Oral (01/25 0621) BP: 141/62 mmHg (01/25 0936) Pulse Rate: 78  (01/25 0621) Intake/Output from previous day: 01/24 0701 - 01/25 0700 In: 963 [P.O.:960; I.V.:3] Out: 350 [Urine:350] Intake/Output from this shift:    Labs:  Basename 06/07/12 0544 06/06/12 0530 06/05/12 0231 06/04/12 2149  WBC -- -- 5.5 6.4  HGB -- -- 11.8* 12.6*  PLT -- -- 185 171  LABCREA -- -- -- --  CREATININE 1.47* 1.40* 1.48* --   Estimated Creatinine Clearance: 34.8 ml/min (by C-G formula based on Cr of 1.47). No results found for this basename: VANCOTROUGH:2,VANCOPEAK:2,VANCORANDOM:2,GENTTROUGH:2,GENTPEAK:2,GENTRANDOM:2,TOBRATROUGH:2,TOBRAPEAK:2,TOBRARND:2,AMIKACINPEAK:2,AMIKACINTROU:2,AMIKACIN:2, in the last 72 hours   Microbiology: No results found for this or any previous visit (from the past 720 hour(s)).  Anti-infectives     Start     Dose/Rate Route Frequency Ordered Stop   06/08/12 1500   levofloxacin (LEVAQUIN) tablet 750 mg        750 mg Oral Every 48 hours 06/07/12 1347 06/14/12 1459   06/07/12 0000   levofloxacin (LEVAQUIN) 500 MG tablet        500 mg Oral Daily 06/07/12 1132     06/06/12 2200   levofloxacin (LEVAQUIN) IVPB 750 mg  Status:  Discontinued        750 mg 100 mL/hr over 90 Minutes Intravenous Every 48 hours 06/05/12 0238 06/06/12 1643   06/06/12 1645   levofloxacin (LEVAQUIN) tablet 500 mg  Status:  Discontinued        500 mg Oral Daily 06/06/12 1643 06/07/12 1347   06/05/12 0230   levofloxacin (LEVAQUIN) IVPB 750 mg  Status:  Discontinued        750 mg 100 mL/hr over 90 Minutes Intravenous Every 24 hours 06/05/12  0226 06/05/12 0237   06/04/12 2345   levofloxacin (LEVAQUIN) IVPB 750 mg        750 mg 100 mL/hr over 90 Minutes Intravenous  Once 06/04/12 2343 06/05/12 0222          Assessment: 89 y/oM admitted for CAP.  Has been on Levaquin IV. Switched to po for discharge. Currently afebrile; WBC wnl.    Goal of Therapy:  eradication of infection  Plan:  Due to impaired renal function, CrCl ~85ml/min, Levaquin dose adjusted to 750 mg po q48h.     Doris Cheadle, PharmD Clinical Pharmacist Pager: [phone] Phone: [phone] 06/07/2012 1:55 PM

## 2012-06-07 NOTE — Discharge Summary (Signed)
Physician Discharge Summary  [name] OZH:[id number] DOB: 1918/12/30 DOA: 06/04/2012  PCP: [name], MD  Admit date: 06/04/2012 Discharge date: 06/07/2012  Time spent: >30 minutes  Recommendations for Outpatient Follow-up:  1. BMET to follow electrolytes and kidney function 2. Reevaluate BP and adjust medication as needed  Discharge Diagnoses:  Active Problems:  SOB (shortness of breath)  Hypertension  Diabetes mellitus  CHF (congestive heart failure)  CAP (community acquired pneumonia)   Discharge Condition: stable and improved. Will be discharge home; follow up with PCP in 2 weeks and with cardiology on 06/10/12 for further medication adjustments and labwork.  Diet recommendation: heart healthy and low carbohydrates  Filed Weights   06/05/12 0227 06/06/12 0218 06/07/12 6295  Weight: 92.307 kg (203 lb 8 oz) 89.9 kg (198 lb 3.1 oz) 88.724 kg (195 lb 9.6 oz)    History of present illness:  79 y.o. male, a retired Careers adviser, with hx of CHF, DM, HTN, ?diastolic dysFx with EF 50%, Hyperlipidemia, s/p coratid endarterectomy, last admitted in 10/13 with CHF felt at that time to be fluid overload, and PNA, presents again to the ER with acute onset of SOB lasting about one hour. He did not have orthopnea or PND this time around. He denied any CP, fever or chills. He did have intermittent productive cough. His legs have been swelling as well, though he has been on Norvasc also. He took Levoquin and extra oral lasix, and presented to the ER. Evaluation in the ER included a CXR with vascular congestion and mulifocal infiltates, BNP of 1507, with Cr of 1.3 and normal WBC and no fever. His EKG showed no ischemic changes and his initial troponin was negative.   Hospital Course:  1-shortness of breath: Secondary to acute on chronic diastolic heart failure and bronchitis/early pneumonia.  -Continue Levaquin PO X 4 more days to finish tx. -will discharge on mucinex -Continue Lasix dose  adjusted by cardiology; 80mg  in am and 40mg  PM  -Continue daily weight and also close intake and output.  -advise to follow heart healthy diet  2-hypertension: Continue current medication. Blood pressure stable.   3-hyperlipidemia: Continue statins.   4-Diabetes: Continue home regimen and follow up with PCP for further insulin adjustments.   5-glaucoma: Continue latanoprost.  6-Rhinitis: will start claritin daily.  Rest of medical problems remains stable and the plan is to continue current medication regimen and follow up with PCP for further medication adjustments as needed.   Procedures: none  Consultations: Cardiology [facility] cardiology)  Discharge Exam: Filed Vitals:   06/06/12 1738 06/06/12 2115 06/07/12 0621 06/07/12 0936  BP: 149/74 140/68 140/53 141/62  Pulse: 72 70 78   Temp:  98.2 F (36.8 C) 98 F (36.7 C)   TempSrc:  Oral Oral   Resp:  18 18   Height:      Weight:   88.724 kg (195 lb 9.6 oz)   SpO2:  96% 98%     General: afebrile, NAD Cardiovascular: S1 and S2, no rubs, no gallops Respiratory: unlabored, no wheezing; scattered rhonchi Abd: soft, NT, ND, Positive BS Extremities: no edema Neuro: non focal  Discharge Instructions  Discharge Orders    Future Orders Please Complete By Expires   Discharge instructions      Comments:   -Take medications as prescribed -follow a heart diet (less 2.5 grams of sodium) -Follow up with PCP in 2 weeks -Follow with Dr. Katrinka Blazing as instructed       Medication List  As of 06/07/2012 11:33 AM    TAKE these medications         amLODipine 10 MG tablet   Commonly known as: NORVASC   Take 10 mg by mouth daily.      aspirin 81 MG tablet   Take 162 mg by mouth 2 (two) times daily.      ferrous sulfate 325 (65 FE) MG tablet   Take 325 mg by mouth daily with breakfast.      furosemide 40 MG tablet   Commonly known as: LASIX   Take 1 tablet (40 mg total) by mouth daily. In the evening      furosemide 80 MG  tablet   Commonly known as: LASIX   Take 1 tablet (80 mg total) by mouth daily. In the morning      guaiFENesin 600 MG 12 hr tablet   Commonly known as: MUCINEX   Take 1 tablet (600 mg total) by mouth 2 (two) times daily.      HYDROcodone-acetaminophen 7.5-500 MG per tablet   Commonly known as: LORTAB   Take 1 tablet by mouth 4 (four) times daily as needed. For pain      insulin glargine 100 UNIT/ML injection   Commonly known as: LANTUS   Inject 13 Units into the skin at bedtime.      insulin lispro 100 UNIT/ML injection   Commonly known as: HUMALOG   Inject 7-9 Units into the skin 3 (three) times daily before meals. 7 units with breakfast and lunch, 9 units after supper      isosorbide mononitrate 30 MG 24 hr tablet   Commonly known as: IMDUR   Take 30 mg by mouth daily.      levofloxacin 500 MG tablet   Commonly known as: LEVAQUIN   Take 1 tablet (500 mg total) by mouth daily.      loratadine 10 MG tablet   Commonly known as: CLARITIN   Take 1 tablet (10 mg total) by mouth daily.      multivitamin with minerals Tabs   Take 1 tablet by mouth daily. Diabetic pak by Ashby Dawes from [employer]      polyethylene glycol packet   Commonly known as: MIRALAX / GLYCOLAX   Take 17 g by mouth daily as needed. For constipation      potassium chloride SA 20 MEQ tablet   Commonly known as: K-DUR,KLOR-CON   Take 20 mEq by mouth daily.      SYSTANE OP   Place 1 drop into both eyes 2 (two) times daily as needed. For dry eyes      valsartan 160 MG tablet   Commonly known as: DIOVAN   Take 160 mg by mouth every morning.      ZIOPTAN 0.0015 % Soln   Generic drug: Tafluprost   Place 1 drop into both eyes at bedtime. ONLY PRESERVATIVE FREE           Follow-up Information    Follow up with Lesleigh Noe, MD. On 06/10/2012. (12:00 Noon with blood work)    Solicitor information:   [address] [place] [address] [phone]       Follow up with [name], MD. Schedule an appointment as soon as possible for a visit in 2 weeks.   Contact information:   [address] [place] [address] [phone]           The results of significant diagnostics  from this hospitalization (including imaging, microbiology, ancillary and laboratory) are listed below for reference.    Significant Diagnostic Studies: Dg Chest 2 View  06/06/2012  *RADIOLOGY REPORT*  Clinical Data: Weakness, shortness of breath, vascular congestion, follow up  CHEST - 2 VIEW  Comparison: 06/04/2012  Findings: Left subclavian sequential transvenous pacemaker leads project at right atrium and right ventricle. Minimal enlargement of cardiac silhouette. Mediastinal contours and pulmonary vascularity normal. Slightly improved pulmonary infiltrates. Probable small left pleural effusion. No pneumothorax or acute osseous findings.  IMPRESSION: Slightly improved pulmonary infiltrates. Minimal enlargement of cardiac silhouette and probable small left pleural effusion.   Original Report Authenticated By: Ulyses Southward, M.D.    Dg Chest 2 View  06/04/2012  *RADIOLOGY REPORT*  Clinical Data: Shortness of breath and cough; history of diabetes and CHF.  CHEST - 2 VIEW  Comparison: Chest radiograph performed 03/09/2012  Findings: The lungs are well expanded.  Patchy bilateral airspace opacities are noted, particularly at the right midlung zone and left lung base, concerning for multifocal pneumonia.  Mild edema could have a similar appearance, but is considered less likely.  No definite pleural effusion or pneumothorax is seen.  The heart mediastinal silhouette is mildly enlarged.  A pacemaker is seen at the left chest wall, with leads ending at the right atrium and right ventricle.  No acute osseous abnormalities are identified.  IMPRESSION:  1.  Patchy bilateral airspace opacities, particularly at the right midlung zone and left lung base, concerning for  multifocal pneumonia.  Mild interstitial edema could have a similar appearance, but is considered less likely. 2.  Mild cardiomegaly noted.   Original Report Authenticated By: Tonia Ghent, M.D.     Labs: Basic Metabolic Panel:  Lab 06/07/12 1610 06/06/12 0530 06/05/12 0231 06/04/12 2149  NA 139 137 -- 135  K 3.7 3.5 -- 4.0  CL 98 97 -- 96  CO2 31 32 -- 29  GLUCOSE 136* 151* -- 160*  BUN 23 22 -- 23  CREATININE 1.47* 1.40* 1.48* 1.53*  CALCIUM 9.5 9.2 -- 9.5  MG -- -- -- --  PHOS -- -- -- --   CBC:  Lab 06/05/12 0231 06/04/12 2149  WBC 5.5 6.4  NEUTROABS -- 4.2  HGB 11.8* 12.6*  HCT 35.0* 37.0*  MCV 93.3 93.7  PLT 185 171   Cardiac Enzymes:  Lab 06/05/12 1555 06/05/12 0911 06/05/12 0224  CKTOTAL -- -- --  CKMB -- -- --  CKMBINDEX -- -- --  TROPONINI <0.30 <0.30 <0.30   BNP: BNP (last 3 results)  Basename 06/04/12 2150 02/29/12 0538 02/09/12 1405  PROBNP 1507.0* 1277.0* 2155.0*   CBG:  Lab 06/07/12 1059 06/07/12 0637 06/06/12 2120 06/06/12 1617 06/06/12 1107  GLUCAP 197* 122* 131* 207* 184*    Signed:  Christeen Lai  [facility] 06/07/2012, 11:33 AM

## 2012-07-06 ENCOUNTER — Encounter (HOSPITAL_COMMUNITY): Payer: Self-pay | Admitting: Emergency Medicine

## 2012-07-06 ENCOUNTER — Emergency Department (HOSPITAL_COMMUNITY)
Admission: EM | Admit: 2012-07-06 | Discharge: 2012-07-06 | Disposition: A | Discharge status: Home / Self Care | Payer: Medicare Other | Attending: Emergency Medicine | Admitting: Emergency Medicine

## 2012-07-06 ENCOUNTER — Emergency Department (HOSPITAL_COMMUNITY): Payer: Medicare Other

## 2012-07-06 DIAGNOSIS — Z79899 Other long term (current) drug therapy: Secondary | ICD-10-CM | POA: Insufficient documentation

## 2012-07-06 DIAGNOSIS — E785 Hyperlipidemia, unspecified: Secondary | ICD-10-CM | POA: Insufficient documentation

## 2012-07-06 DIAGNOSIS — D649 Anemia, unspecified: Secondary | ICD-10-CM | POA: Insufficient documentation

## 2012-07-06 DIAGNOSIS — Z8709 Personal history of other diseases of the respiratory system: Secondary | ICD-10-CM | POA: Insufficient documentation

## 2012-07-06 DIAGNOSIS — Z8679 Personal history of other diseases of the circulatory system: Secondary | ICD-10-CM | POA: Insufficient documentation

## 2012-07-06 DIAGNOSIS — Z8739 Personal history of other diseases of the musculoskeletal system and connective tissue: Secondary | ICD-10-CM | POA: Insufficient documentation

## 2012-07-06 DIAGNOSIS — Z794 Long term (current) use of insulin: Secondary | ICD-10-CM | POA: Insufficient documentation

## 2012-07-06 DIAGNOSIS — I10 Essential (primary) hypertension: Secondary | ICD-10-CM | POA: Insufficient documentation

## 2012-07-06 DIAGNOSIS — E119 Type 2 diabetes mellitus without complications: Secondary | ICD-10-CM | POA: Insufficient documentation

## 2012-07-06 DIAGNOSIS — I509 Heart failure, unspecified: Secondary | ICD-10-CM | POA: Insufficient documentation

## 2012-07-06 DIAGNOSIS — I2510 Atherosclerotic heart disease of native coronary artery without angina pectoris: Secondary | ICD-10-CM | POA: Insufficient documentation

## 2012-07-06 DIAGNOSIS — M129 Arthropathy, unspecified: Secondary | ICD-10-CM | POA: Insufficient documentation

## 2012-07-06 DIAGNOSIS — Z7982 Long term (current) use of aspirin: Secondary | ICD-10-CM | POA: Insufficient documentation

## 2012-07-06 DIAGNOSIS — Z8701 Personal history of pneumonia (recurrent): Secondary | ICD-10-CM | POA: Insufficient documentation

## 2012-07-06 DIAGNOSIS — Z950 Presence of cardiac pacemaker: Secondary | ICD-10-CM | POA: Insufficient documentation

## 2012-07-06 LAB — BASIC METABOLIC PANEL
Chloride: 97 mEq/L (ref 96–112)
Creatinine, Ser: 1.53 mg/dL — ABNORMAL HIGH (ref 0.50–1.35)
GFR calc Af Amer: 44 mL/min — ABNORMAL LOW (ref >90)
GFR calc non Af Amer: 38 mL/min — ABNORMAL LOW (ref >90)
Potassium: 4.2 mEq/L (ref 3.5–5.1)

## 2012-07-06 LAB — CBC WITH DIFFERENTIAL/PLATELET
Basophils Absolute: 0 10*3/uL (ref 0.0–0.1)
Basophils Relative: 0 % (ref 0–1)
Eosinophils Absolute: 0.2 10*3/uL (ref 0.0–0.7)
MCHC: 34.3 g/dL (ref 30.0–36.0)
Monocytes Absolute: 0.4 10*3/uL (ref 0.1–1.0)
Neutro Abs: 2.6 10*3/uL (ref 1.7–7.7)
Neutrophils Relative %: 56 % (ref 43–77)
RDW: 13.7 % (ref 11.5–15.5)

## 2012-07-06 LAB — PRO B NATRIURETIC PEPTIDE: Pro B Natriuretic peptide (BNP): 2295 pg/mL — ABNORMAL HIGH (ref 0–450)

## 2012-07-06 MED ORDER — FUROSEMIDE 10 MG/ML IJ SOLN
40.0000 mg | Freq: Once | INTRAMUSCULAR | Status: AC
  Administered 2012-07-06: 40 mg via INTRAVENOUS
  Filled 2012-07-06: qty 4

## 2012-07-06 NOTE — ED Provider Notes (Signed)
History     CSN: [id number]  Arrival date & time 07/06/12  4098   First MD Initiated Contact with Patient 07/06/12 [phone]      Chief Complaint  Patient presents with  . Shortness of Breath    (Consider location/radiation/quality/duration/timing/severity/associated sxs/prior treatment) HPI Comments: Patient comes to the ER with concern over possible congestive heart failure exacerbation. Patient reports that he gained 2.7 pounds yesterday and then awoke this morning slightly short of breath with some tightness in the chest. Patient indicates that he has had multiple congestive heart failure exacerbations in his Dr. has advised him to come to the ER whenever he has any weight gain.  Patient is a 79 y.o. male presenting with shortness of breath.  Shortness of Breath Associated symptoms: no fever     Past Medical History  Diagnosis Date  . CHF (congestive heart failure)   . Diabetes mellitus   . Hypertension   . Hyperlipidemia   . Coronary artery disease   . Spinal stenosis   . Dysrhythmia   . Pneumonia   . Pacemaker   . Arthritis   . Anemia     Past Surgical History  Procedure Laterality Date  . Lumbar laminectomy    . Back surgery    . Total knee arthroplasty    . Quadriceps tendon repair    . Cataract extraction    . Insert / replace / remove pacemaker    . Carotid endarterectomy      Family History  Problem Relation Age of Onset  . Hypertension Father     History  Substance Use Topics  . Smoking status: Never Smoker   . Smokeless tobacco: Never Used  . Alcohol Use: No      Review of Systems  Constitutional: Negative for fever.  Respiratory: Positive for chest tightness and shortness of breath.   All other systems reviewed and are negative.    Allergies  Statins  Home Medications   Current Outpatient Rx  Name  Route  Sig  Dispense  Refill  . amLODipine (NORVASC) 10 MG tablet   Oral   Take 10 mg by mouth daily.         [name] aspirin 81 MG  tablet   Oral   Take 162 mg by mouth 2 (two) times daily.          . ferrous sulfate 325 (65 FE) MG tablet   Oral   Take 325 mg by mouth daily with breakfast.         . furosemide (LASIX) 40 MG tablet   Oral   Take 1 tablet (40 mg total) by mouth daily. In the evening   30 tablet   0   . furosemide (LASIX) 80 MG tablet   Oral   Take 1 tablet (80 mg total) by mouth daily. In the morning         . guaiFENesin (MUCINEX) 600 MG 12 hr tablet   Oral   Take 1 tablet (600 mg total) by mouth 2 (two) times daily.   30 tablet   0   . HYDROcodone-acetaminophen (LORTAB) 7.5-500 MG per tablet   Oral   Take 1 tablet by mouth 4 (four) times daily as needed. For pain   65 tablet   0   . insulin glargine (LANTUS) 100 UNIT/ML injection   Subcutaneous   Inject 13 Units into the skin at bedtime.          . insulin lispro (HUMALOG) 100  UNIT/ML injection   Subcutaneous   Inject 7-9 Units into the skin 3 (three) times daily before meals. 7 units with breakfast and lunch, 9 units after supper         . isosorbide mononitrate (IMDUR) 30 MG 24 hr tablet   Oral   Take 30 mg by mouth daily.         [name] levofloxacin (LEVAQUIN) 500 MG tablet   Oral   Take 1 tablet (500 mg total) by mouth daily.   4 tablet   0   . loratadine (CLARITIN) 10 MG tablet   Oral   Take 1 tablet (10 mg total) by mouth daily.   30 tablet   1   . Multiple Vitamin (MULTIVITAMIN WITH MINERALS) TABS   Oral   Take 1 tablet by mouth daily. Diabetic pak by Ashby Dawes from [employer]         . Polyethyl Glycol-Propyl Glycol (SYSTANE OP)   Both Eyes   Place 1 drop into both eyes 2 (two) times daily as needed. For dry eyes         . polyethylene glycol (MIRALAX / GLYCOLAX) packet   Oral   Take 17 g by mouth daily as needed. For constipation         . potassium chloride SA (K-DUR,KLOR-CON) 20 MEQ tablet   Oral   Take 20 mEq by mouth daily.         . Tafluprost (ZIOPTAN) 0.0015 % SOLN   Both Eyes   Place  1 drop into both eyes at bedtime. ONLY PRESERVATIVE FREE         . valsartan (DIOVAN) 160 MG tablet   Oral   Take 160 mg by mouth every morning.            BP 121/64  Temp(Src) 97.8 F (36.6 C) (Oral)  Resp 17  Wt 201 lb (91.173 kg)  BMI 31.47 kg/m2  SpO2 95%  Physical Exam  Constitutional: He is oriented to person, place, and time. He appears well-developed and well-nourished. No distress.  HENT:  Head: Normocephalic and atraumatic.  Right Ear: Hearing normal.  Nose: Nose normal.  Mouth/Throat: Oropharynx is clear and moist and mucous membranes are normal.  Eyes: Conjunctivae and EOM are normal. Pupils are equal, round, and reactive to light.  Neck: Normal range of motion. Neck supple.  Cardiovascular: Normal rate, regular rhythm, S1 normal and S2 normal.  Exam reveals no gallop and no friction rub.   No murmur heard. Pulmonary/Chest: Effort normal and breath sounds normal. No respiratory distress. He exhibits no tenderness.  Abdominal: Soft. Normal appearance and bowel sounds are normal. There is no hepatosplenomegaly. There is no tenderness. There is no rebound, no guarding, no tenderness at McBurney's point and negative Murphy's sign. No hernia.  Musculoskeletal: Normal range of motion.  1+ pitting edema bilateral lower extremities  Neurological: He is alert and oriented to person, place, and time. He has normal strength. No cranial nerve deficit or sensory deficit. Coordination normal. GCS eye subscore is 4. GCS verbal subscore is 5. GCS motor subscore is 6.  Skin: Skin is warm, dry and intact. No rash noted. No cyanosis.  Psychiatric: He has a normal mood and affect. His speech is normal and behavior is normal. Thought content normal.    ED Course  Procedures (including critical care time)   Date: 07/06/2012  Rate: 70  Rhythm: atrial paced  QRS Axis: normal  Intervals: normal QT  ST/T Wave abnormalities: nonspecific ST/T  changes  Conduction  Disutrbances:borderline intraventricular conduction delay  Narrative Interpretation:   Old EKG Reviewed: unchanged   Labs Reviewed  CBC WITH DIFFERENTIAL - Abnormal; Notable for the following:    RBC 3.83 (*)    Hemoglobin 12.1 (*)    HCT 35.3 (*)    All other components within normal limits  BASIC METABOLIC PANEL - Abnormal; Notable for the following:    Glucose, Bld 198 (*)    BUN 27 (*)    Creatinine, Ser 1.53 (*)    GFR calc non Af Amer 38 (*)    GFR calc Af Amer 44 (*)    All other components within normal limits  PRO B NATRIURETIC PEPTIDE - Abnormal; Notable for the following:    Pro B Natriuretic peptide (BNP) 2295.0 (*)    All other components within normal limits  TROPONIN I   Dg Chest 2 View  07/06/2012  *RADIOLOGY REPORT*  Clinical Data: Shortness of breath and fluid retention.  CHEST - 2 VIEW  Comparison: 06/06/2012  Findings: There is evidence of mild to moderate CHF.  No pleural effusions identified.  Heart size is stable.  There is a stable appearance to a dual chamber pacemaker.  IMPRESSION: Mild to moderate CHF.   Original Report Authenticated By: Irish Lack, M.D.      Diagnosis: Congestive heart failure    MDM  Patient presents to the ER for evaluation of small weight gain and slight shortness of breath. Patient has a history of congestive heart failure. He was in no distress at arrival. Room air oxygen saturations were within normal limits. Blood pressure is also within normal limits. Chest x-ray did show mild to moderate evidence of congestive heart failure. His natruretic peptide was around his normal baseline. Patient was administered IV Lasix. History taken his normal Lasix 80 mg by mouth before coming to the ER. He was given additional 40 mg IV and had significant diuresis here in the ER. Patient's breathing is now comfortable at his baseline he is improved. Patient will be discharged, continue to follow his weights and follow up with primary Dr. in the  office this week. Return to the ER if he has any increased weight gain or shortness of breath.        Gilda Crease, MD 07/06/12 [phone]

## 2012-07-06 NOTE — ED Notes (Signed)
Patient discharged using teach back method. Patient verbalizes an understanding. Discharged with family with instructions given. Voices no C/O at the time of discharge.

## 2012-07-06 NOTE — ED Notes (Signed)
Voided 300cc of clear yellow urine

## 2012-07-06 NOTE — ED Notes (Signed)
Patient voided 250cc of clear yellow urine.

## 2012-07-06 NOTE — ED Notes (Signed)
Patient advises he has slight SOB this am. History of CHF per his advice. Patient states he has gained 2.9lbs since yesterday. NAD noted at this time

## 2012-07-06 NOTE — ED Notes (Signed)
IV without success IV team notified

## 2012-07-06 NOTE — ED Notes (Signed)
Voided 175 cc of clear yellow urine

## 2012-07-06 NOTE — ED Notes (Signed)
Patient transported to X-ray 

## 2012-07-06 NOTE — ED Notes (Signed)
Two RN's assess for IV site, one RN attempted without success.

## 2012-07-06 NOTE — ED Notes (Signed)
Patient voided 350 cc.of clear yellow urine.

## 2012-07-06 NOTE — ED Notes (Signed)
Back from xray

## 2012-08-15 ENCOUNTER — Telehealth: Payer: Self-pay

## 2012-08-15 NOTE — Telephone Encounter (Signed)
Pharmacy calling asking that a new rx be prescribed for Hydrocodone.  Previous rx for 7.5/500mg  is no longer available.  They would like a new rx called in for 7.5/325mg .  Former [facility] patient.  Forwarding request to Dr Eulah Citizen this afternoon.

## 2012-08-18 ENCOUNTER — Other Ambulatory Visit: Payer: Self-pay

## 2012-08-18 ENCOUNTER — Inpatient Hospital Stay (HOSPITAL_COMMUNITY): Payer: Medicare Other

## 2012-08-18 ENCOUNTER — Encounter (INDEPENDENT_AMBULATORY_CARE_PROVIDER_SITE_OTHER): Payer: Self-pay

## 2012-08-18 ENCOUNTER — Encounter (HOSPITAL_COMMUNITY): Payer: Self-pay | Admitting: Internal Medicine

## 2012-08-18 ENCOUNTER — Emergency Department (HOSPITAL_COMMUNITY): Payer: Medicare Other

## 2012-08-18 ENCOUNTER — Inpatient Hospital Stay (HOSPITAL_COMMUNITY)
Admission: EM | Admit: 2012-08-18 | Discharge: 2012-08-19 | DRG: 291 | Disposition: A | Discharge status: Home / Self Care | Payer: Medicare Other | Attending: Internal Medicine | Admitting: Internal Medicine

## 2012-08-18 DIAGNOSIS — J9600 Acute respiratory failure, unspecified whether with hypoxia or hypercapnia: Secondary | ICD-10-CM | POA: Diagnosis present

## 2012-08-18 DIAGNOSIS — D509 Iron deficiency anemia, unspecified: Secondary | ICD-10-CM | POA: Diagnosis present

## 2012-08-18 DIAGNOSIS — R0902 Hypoxemia: Secondary | ICD-10-CM

## 2012-08-18 DIAGNOSIS — R0603 Acute respiratory distress: Secondary | ICD-10-CM

## 2012-08-18 DIAGNOSIS — I509 Heart failure, unspecified: Secondary | ICD-10-CM

## 2012-08-18 DIAGNOSIS — N179 Acute kidney failure, unspecified: Secondary | ICD-10-CM

## 2012-08-18 DIAGNOSIS — I5032 Chronic diastolic (congestive) heart failure: Secondary | ICD-10-CM

## 2012-08-18 DIAGNOSIS — M4800 Spinal stenosis, site unspecified: Secondary | ICD-10-CM | POA: Diagnosis present

## 2012-08-18 DIAGNOSIS — I119 Hypertensive heart disease without heart failure: Secondary | ICD-10-CM | POA: Diagnosis present

## 2012-08-18 DIAGNOSIS — R0609 Other forms of dyspnea: Secondary | ICD-10-CM

## 2012-08-18 DIAGNOSIS — I129 Hypertensive chronic kidney disease with stage 1 through stage 4 chronic kidney disease, or unspecified chronic kidney disease: Secondary | ICD-10-CM | POA: Diagnosis present

## 2012-08-18 DIAGNOSIS — R0600 Dyspnea, unspecified: Secondary | ICD-10-CM

## 2012-08-18 DIAGNOSIS — Z794 Long term (current) use of insulin: Secondary | ICD-10-CM

## 2012-08-18 DIAGNOSIS — N183 Chronic kidney disease, stage 3 unspecified: Secondary | ICD-10-CM | POA: Diagnosis present

## 2012-08-18 DIAGNOSIS — Z79899 Other long term (current) drug therapy: Secondary | ICD-10-CM

## 2012-08-18 DIAGNOSIS — I10 Essential (primary) hypertension: Secondary | ICD-10-CM

## 2012-08-18 DIAGNOSIS — E118 Type 2 diabetes mellitus with unspecified complications: Secondary | ICD-10-CM | POA: Diagnosis present

## 2012-08-18 DIAGNOSIS — K5900 Constipation, unspecified: Secondary | ICD-10-CM | POA: Diagnosis present

## 2012-08-18 DIAGNOSIS — E785 Hyperlipidemia, unspecified: Secondary | ICD-10-CM | POA: Diagnosis present

## 2012-08-18 DIAGNOSIS — E1142 Type 2 diabetes mellitus with diabetic polyneuropathy: Secondary | ICD-10-CM | POA: Diagnosis present

## 2012-08-18 DIAGNOSIS — IMO0002 Reserved for concepts with insufficient information to code with codable children: Secondary | ICD-10-CM | POA: Diagnosis present

## 2012-08-18 DIAGNOSIS — I5033 Acute on chronic diastolic (congestive) heart failure: Principal | ICD-10-CM

## 2012-08-18 DIAGNOSIS — J189 Pneumonia, unspecified organism: Secondary | ICD-10-CM | POA: Diagnosis present

## 2012-08-18 DIAGNOSIS — R0602 Shortness of breath: Secondary | ICD-10-CM | POA: Diagnosis present

## 2012-08-18 DIAGNOSIS — E119 Type 2 diabetes mellitus without complications: Secondary | ICD-10-CM

## 2012-08-18 DIAGNOSIS — Z96659 Presence of unspecified artificial knee joint: Secondary | ICD-10-CM

## 2012-08-18 DIAGNOSIS — I2510 Atherosclerotic heart disease of native coronary artery without angina pectoris: Secondary | ICD-10-CM | POA: Diagnosis present

## 2012-08-18 DIAGNOSIS — Z950 Presence of cardiac pacemaker: Secondary | ICD-10-CM

## 2012-08-18 LAB — GLUCOSE, CAPILLARY
Glucose-Capillary: 161 mg/dL — ABNORMAL HIGH (ref 70–99)
Glucose-Capillary: 77 mg/dL (ref 70–99)

## 2012-08-18 LAB — CBC WITH DIFFERENTIAL/PLATELET
Basophils Absolute: 0 10*3/uL (ref 0.0–0.1)
Basophils Relative: 1 % (ref 0–1)
Eosinophils Absolute: 0.3 10*3/uL (ref 0.0–0.7)
Eosinophils Relative: 5 % (ref 0–5)
Lymphocytes Relative: 28 % (ref 12–46)
MCH: 31.3 pg (ref 26.0–34.0)
MCHC: 34.3 g/dL (ref 30.0–36.0)
MCV: 91.5 fL (ref 78.0–100.0)
Platelets: 222 10*3/uL (ref 150–400)
RDW: 13.8 % (ref 11.5–15.5)
WBC: 6.6 10*3/uL (ref 4.0–10.5)

## 2012-08-18 LAB — HEPARIN LEVEL (UNFRACTIONATED): Heparin Unfractionated: 0.67 IU/mL (ref 0.30–0.70)

## 2012-08-18 LAB — POCT I-STAT, CHEM 8
Calcium, Ion: 1.17 mmol/L (ref 1.13–1.30)
Glucose, Bld: 111 mg/dL — ABNORMAL HIGH (ref 70–99)
HCT: 37 % — ABNORMAL LOW (ref 39.0–52.0)
Hemoglobin: 12.6 g/dL — ABNORMAL LOW (ref 13.0–17.0)
TCO2: 33 mmol/L (ref 0–100)

## 2012-08-18 LAB — TROPONIN I: Troponin I: <0.3 ng/mL (ref <0.30)

## 2012-08-18 LAB — POCT I-STAT TROPONIN I: Troponin i, poc: 0.12 ng/mL (ref 0.00–0.08)

## 2012-08-18 LAB — D-DIMER, QUANTITATIVE: D-Dimer, Quant: 1.47 ug/mL-FEU — ABNORMAL HIGH (ref 0.00–0.48)

## 2012-08-18 MED ORDER — POTASSIUM CHLORIDE CRYS ER 20 MEQ PO TBCR
20.0000 meq | EXTENDED_RELEASE_TABLET | Freq: Every day | ORAL | Status: DC
  Administered 2012-08-18 – 2012-08-19 (×2): 20 meq via ORAL
  Filled 2012-08-18 (×2): qty 1

## 2012-08-18 MED ORDER — FUROSEMIDE 80 MG PO TABS
120.0000 mg | ORAL_TABLET | ORAL | Status: DC
  Administered 2012-08-19: 120 mg via ORAL
  Filled 2012-08-18 (×3): qty 1

## 2012-08-18 MED ORDER — DUTASTERIDE 0.5 MG PO CAPS
0.5000 mg | ORAL_CAPSULE | Freq: Every day | ORAL | Status: DC
  Administered 2012-08-18 – 2012-08-19 (×2): 0.5 mg via ORAL
  Filled 2012-08-18 (×2): qty 1

## 2012-08-18 MED ORDER — POLYETHYLENE GLYCOL 3350 17 G PO PACK
17.0000 g | PACK | Freq: Every day | ORAL | Status: DC | PRN
  Filled 2012-08-18: qty 1

## 2012-08-18 MED ORDER — NITROGLYCERIN 0.4 MG SL SUBL
SUBLINGUAL_TABLET | SUBLINGUAL | Status: AC
  Filled 2012-08-18: qty 25

## 2012-08-18 MED ORDER — ENOXAPARIN SODIUM 40 MG/0.4ML ~~LOC~~ SOLN
40.0000 mg | SUBCUTANEOUS | Status: DC
  Administered 2012-08-18: 40 mg via SUBCUTANEOUS
  Filled 2012-08-18 (×2): qty 0.4

## 2012-08-18 MED ORDER — HEPARIN BOLUS VIA INFUSION
4000.0000 [IU] | Freq: Once | INTRAVENOUS | Status: AC
  Administered 2012-08-18: 4000 [IU] via INTRAVENOUS

## 2012-08-18 MED ORDER — AZITHROMYCIN 500 MG PO TABS
500.0000 mg | ORAL_TABLET | ORAL | Status: DC
  Filled 2012-08-18: qty 1

## 2012-08-18 MED ORDER — LATANOPROST 0.005 % OP SOLN
1.0000 [drp] | OPHTHALMIC | Status: DC
  Filled 2012-08-18: qty 2.5

## 2012-08-18 MED ORDER — ASPIRIN 81 MG PO CHEW
324.0000 mg | CHEWABLE_TABLET | Freq: Once | ORAL | Status: AC
  Administered 2012-08-18: 324 mg via ORAL
  Filled 2012-08-18: qty 4

## 2012-08-18 MED ORDER — INSULIN ASPART 100 UNIT/ML ~~LOC~~ SOLN
9.0000 [IU] | Freq: Every day | SUBCUTANEOUS | Status: DC
  Administered 2012-08-18: 9 [IU] via SUBCUTANEOUS

## 2012-08-18 MED ORDER — TECHNETIUM TO 99M ALBUMIN AGGREGATED: 6.0000 | Freq: Once | INTRAVENOUS | Status: AC | PRN

## 2012-08-18 MED ORDER — AMLODIPINE BESYLATE 10 MG PO TABS
10.0000 mg | ORAL_TABLET | Freq: Every day | ORAL | Status: DC
  Administered 2012-08-18 – 2012-08-19 (×2): 10 mg via ORAL
  Filled 2012-08-18 (×2): qty 1

## 2012-08-18 MED ORDER — IRBESARTAN 150 MG PO TABS
150.0000 mg | ORAL_TABLET | Freq: Every day | ORAL | Status: DC
  Administered 2012-08-18 – 2012-08-19 (×2): 150 mg via ORAL
  Filled 2012-08-18 (×2): qty 1

## 2012-08-18 MED ORDER — ACETAMINOPHEN 325 MG PO TABS: 650.0000 mg | ORAL_TABLET | Freq: Four times a day (QID) | ORAL | Status: DC | PRN

## 2012-08-18 MED ORDER — FUROSEMIDE 80 MG PO TABS: 80.0000 mg | ORAL_TABLET | Freq: Two times a day (BID) | ORAL | Status: DC

## 2012-08-18 MED ORDER — ONDANSETRON HCL 4 MG/2ML IJ SOLN: 4.0000 mg | Freq: Four times a day (QID) | INTRAMUSCULAR | Status: DC | PRN

## 2012-08-18 MED ORDER — INSULIN GLARGINE 100 UNIT/ML ~~LOC~~ SOLN
13.0000 [IU] | Freq: Every day | SUBCUTANEOUS | Status: DC
  Administered 2012-08-18: 13 [IU] via SUBCUTANEOUS
  Filled 2012-08-18 (×3): qty 0.13

## 2012-08-18 MED ORDER — ACETAMINOPHEN 650 MG RE SUPP: 650.0000 mg | Freq: Four times a day (QID) | RECTAL | Status: DC | PRN

## 2012-08-18 MED ORDER — HEPARIN (PORCINE) IN NACL 100-0.45 UNIT/ML-% IJ SOLN
1400.0000 [IU]/h | INTRAMUSCULAR | Status: DC
  Administered 2012-08-18: 1400 [IU]/h via INTRAVENOUS
  Filled 2012-08-18 (×2): qty 250

## 2012-08-18 MED ORDER — DEXTROSE 5 % IV SOLN
500.0000 mg | Freq: Once | INTRAVENOUS | Status: AC
  Administered 2012-08-18: 500 mg via INTRAVENOUS
  Filled 2012-08-18: qty 500

## 2012-08-18 MED ORDER — CEFTRIAXONE SODIUM 1 G IJ SOLR
1.0000 g | INTRAMUSCULAR | Status: DC
  Administered 2012-08-18 – 2012-08-19 (×2): 1 g via INTRAVENOUS
  Filled 2012-08-18 (×2): qty 10

## 2012-08-18 MED ORDER — ASPIRIN EC 81 MG PO TBEC
162.0000 mg | DELAYED_RELEASE_TABLET | Freq: Two times a day (BID) | ORAL | Status: DC
  Administered 2012-08-18 – 2012-08-19 (×3): 162 mg via ORAL
  Filled 2012-08-18 (×4): qty 2

## 2012-08-18 MED ORDER — TAFLUPROST 0.0015 % OP SOLN: 1.0000 [drp] | OPHTHALMIC | Status: DC

## 2012-08-18 MED ORDER — ISOSORBIDE MONONITRATE ER 30 MG PO TB24
30.0000 mg | ORAL_TABLET | Freq: Every day | ORAL | Status: DC
  Administered 2012-08-18 – 2012-08-19 (×2): 30 mg via ORAL
  Filled 2012-08-18 (×2): qty 1

## 2012-08-18 MED ORDER — FUROSEMIDE 80 MG PO TABS
80.0000 mg | ORAL_TABLET | Freq: Every evening | ORAL | Status: DC
  Administered 2012-08-18: 80 mg via ORAL
  Filled 2012-08-18 (×2): qty 1

## 2012-08-18 MED ORDER — DEXTROSE 5 % IV SOLN: 1.0000 g | Freq: Once | INTRAVENOUS | Status: DC

## 2012-08-18 MED ORDER — INSULIN ASPART 100 UNIT/ML ~~LOC~~ SOLN
7.0000 [IU] | Freq: Two times a day (BID) | SUBCUTANEOUS | Status: DC
  Administered 2012-08-18 – 2012-08-19 (×3): 7 [IU] via SUBCUTANEOUS

## 2012-08-18 MED ORDER — INSULIN ASPART 100 UNIT/ML ~~LOC~~ SOLN: 0.0000 [IU] | Freq: Every day | SUBCUTANEOUS | Status: DC

## 2012-08-18 MED ORDER — RIVAROXABAN 15 MG PO TABS
15.0000 mg | ORAL_TABLET | Freq: Once | ORAL | Status: DC
  Administered 2012-08-18: 15 mg via ORAL
  Filled 2012-08-18: qty 1

## 2012-08-18 MED ORDER — INSULIN LISPRO 100 UNIT/ML ~~LOC~~ SOLN
7.0000 [IU] | Freq: Two times a day (BID) | SUBCUTANEOUS | Status: DC
  Filled 2012-08-18: qty 3

## 2012-08-18 MED ORDER — HYDROCODONE-ACETAMINOPHEN 7.5-325 MG PO TABS: 1.0000 | ORAL_TABLET | Freq: Four times a day (QID) | ORAL | Status: DC | PRN

## 2012-08-18 MED ORDER — DEXTROSE 5 % IV SOLN: 100.0000 mg/kg | Freq: Once | INTRAVENOUS | Status: DC

## 2012-08-18 MED ORDER — AZITHROMYCIN 500 MG PO TABS
500.0000 mg | ORAL_TABLET | ORAL | Status: DC
  Administered 2012-08-19: 500 mg via ORAL
  Filled 2012-08-18 (×2): qty 1

## 2012-08-18 MED ORDER — FERROUS SULFATE 325 (65 FE) MG PO TABS
325.0000 mg | ORAL_TABLET | Freq: Every day | ORAL | Status: DC
  Administered 2012-08-18 – 2012-08-19 (×2): 325 mg via ORAL
  Filled 2012-08-18 (×3): qty 1

## 2012-08-18 MED ORDER — DEXTROSE 5 % IV SOLN
500.0000 mg | Freq: Once | INTRAVENOUS | Status: DC
  Filled 2012-08-18: qty 500

## 2012-08-18 MED ORDER — ONDANSETRON HCL 4 MG PO TABS: 4.0000 mg | ORAL_TABLET | Freq: Four times a day (QID) | ORAL | Status: DC | PRN

## 2012-08-18 MED ORDER — INSULIN ASPART 100 UNIT/ML ~~LOC~~ SOLN
0.0000 [IU] | Freq: Three times a day (TID) | SUBCUTANEOUS | Status: DC
  Administered 2012-08-18 (×2): 1 [IU] via SUBCUTANEOUS
  Administered 2012-08-18 – 2012-08-19 (×2): 2 [IU] via SUBCUTANEOUS

## 2012-08-18 MED ORDER — FUROSEMIDE 10 MG/ML IJ SOLN
100.0000 mg | Freq: Once | INTRAVENOUS | Status: AC
  Administered 2012-08-18: 100 mg via INTRAVENOUS
  Filled 2012-08-18: qty 10

## 2012-08-18 MED ORDER — ADULT MULTIVITAMIN W/MINERALS CH
1.0000 | ORAL_TABLET | Freq: Every day | ORAL | Status: DC
  Administered 2012-08-18 – 2012-08-19 (×2): 1 via ORAL
  Filled 2012-08-18 (×2): qty 1

## 2012-08-18 MED ORDER — FUROSEMIDE 10 MG/ML IJ SOLN
INTRAMUSCULAR | Status: AC
  Filled 2012-08-18: qty 4

## 2012-08-18 NOTE — Progress Notes (Signed)
ANTICOAGULATION CONSULT NOTE - Follow Up Consult  Pharmacy Consult for Heparin Indication: CP, rule out PE  Allergies  Allergen Reactions  . Statins Other (See Comments)    rhabdomyolisis   Labs:  Recent Labs  08/18/12 0244 08/18/12 0309 08/18/12 0414 08/18/12 0431 08/18/12 1006 08/18/12 1356  HGB 12.1* 12.6*  --   --   --   --   HCT 35.3* 37.0*  --   --   --   --   PLT 222  --   --   --   --   --   LABPROT  --   --   --  12.9  --   --   INR  --   --   --  0.98  --   --   HEPARINUNFRC  --   --   --   --   --  0.67  CREATININE  --  1.60*  --   --   --   --   TROPONINI  --   --  <0.30  --  <0.30  --     Estimated Creatinine Clearance: 31.7 ml/min (by C-G formula based on Cr of 1.6).   Assessment: 79 year old male continues on heparin for rule out PE.  VQ scan negative for PE, cardiac enzymes negative to date  Heparin level therapeutic  Goal of Therapy:  Heparin level 0.3-0.7 units/ml Monitor platelets by anticoagulation protocol: Yes   Plan:  1) Continue heparin at 1400 units / hr 2) Follow up for heparin dc   Thank you. Okey Regal, PharmD [phone]  08/18/2012,2:53 PM

## 2012-08-18 NOTE — ED Provider Notes (Signed)
History     CSN: [id number]  Arrival date & time 08/18/12  0221   First MD Initiated Contact with Patient 08/18/12 [phone]      Chief Complaint  Patient presents with  . Shortness of Breath    (Consider location/radiation/quality/duration/timing/severity/associated sxs/prior treatment) HPI Comments: [name]. is a 79 y.o. Male who was going to the bathroom tonight when he began coughing. He then felt short of breath and had a tightness in his chest. A family member noticed that he was diaphoretic, at the time.  An ambulance was called and found him to be hypoxic at 88% on room air. He is transferred here with oxygen by nasal cannula with improvement of his saturation to 100%. He has had some weight gain recently, and his cardiologist, recommended an increased dose of Lasix. This helped, and he is back at his baseline weight of 196 pounds. He denies recent fever, chills, nausea, vomiting, weakness, or dizziness. There no other modifying factors.  Patient is a 79 y.o. male presenting with shortness of breath. The history is provided by the patient.  Shortness of Breath   Past Medical History  Diagnosis Date  . CHF (congestive heart failure)   . Diabetes mellitus   . Hypertension   . Hyperlipidemia   . Coronary artery disease   . Spinal stenosis   . Dysrhythmia   . Pneumonia   . Pacemaker   . Arthritis   . Anemia     Past Surgical History  Procedure Laterality Date  . Lumbar laminectomy    . Back surgery    . Total knee arthroplasty    . Quadriceps tendon repair    . Cataract extraction    . Insert / replace / remove pacemaker    . Carotid endarterectomy      Family History  Problem Relation Age of Onset  . Multiple myeloma Father   . Hypertension Mother     History  Substance Use Topics  . Smoking status: Never Smoker   . Smokeless tobacco: Never Used  . Alcohol Use: Yes     Comment: Cocktails occasionally      Review of Systems  Respiratory:  Positive for shortness of breath.   All other systems reviewed and are negative.    Allergies  Statins  Home Medications   Current Outpatient Rx  Name  Route  Sig  Dispense  Refill  . amLODipine (NORVASC) 10 MG tablet   Oral   Take 10 mg by mouth daily.         [name] aspirin 81 MG tablet   Oral   Take 162 mg by mouth 2 (two) times daily.          [name] dutasteride (AVODART) 0.5 MG capsule   Oral   Take 0.5 mg by mouth daily.         . ferrous sulfate 325 (65 FE) MG tablet   Oral   Take 325 mg by mouth daily with breakfast.         . furosemide (LASIX) 80 MG tablet   Oral   Take 80-120 mg by mouth 2 (two) times daily.          [name] HYDROcodone-acetaminophen (LORTAB) 7.5-500 MG per tablet   Oral   Take 1 tablet by mouth 4 (four) times daily as needed for pain. For pain         . insulin glargine (LANTUS) 100 UNIT/ML injection   Subcutaneous   Inject 13  Units into the skin at bedtime.          . insulin lispro (HUMALOG) 100 UNIT/ML injection   Subcutaneous   Inject 7-9 Units into the skin 3 (three) times daily before meals. 7 units with breakfast and lunch, 9 units after supper         . isosorbide mononitrate (IMDUR) 30 MG 24 hr tablet   Oral   Take 30 mg by mouth daily.         . Multiple Vitamin (MULTIVITAMIN WITH MINERALS) TABS   Oral   Take 1 tablet by mouth daily. Diabetic pak by Ashby Dawes from [employer]         . Polyethyl Glycol-Propyl Glycol (SYSTANE OP)   Both Eyes   Place 1 drop into both eyes 2 (two) times daily as needed. For dry eyes         . polyethylene glycol (MIRALAX / GLYCOLAX) packet   Oral   Take 17 g by mouth daily as needed. For constipation         . potassium chloride SA (K-DUR,KLOR-CON) 20 MEQ tablet   Oral   Take 20 mEq by mouth daily.         . Tafluprost (ZIOPTAN) 0.0015 % SOLN   Both Eyes   Place 1 drop into both eyes every other day. ONLY PRESERVATIVE FREE         . valsartan (DIOVAN) 160 MG tablet   Oral    Take 160 mg by mouth every morning.            BP 150/63  Pulse 70  Temp(Src) 98 F (36.7 C) (Oral)  Ht 5' 6.93" (1.7 m)  Wt 201 lb 1 oz (91.2 kg)  BMI 31.56 kg/m2  SpO2 99%  Physical Exam  Nursing note and vitals reviewed. Constitutional: He is oriented to person, place, and time. He appears well-developed.  Elderly, but appears younger than his stated age.  HENT:  Head: Normocephalic and atraumatic.  Right Ear: External ear normal.  Left Ear: External ear normal.  Eyes: Conjunctivae and EOM are normal. Pupils are equal, round, and reactive to light.  Neck: Normal range of motion and phonation normal. Neck supple.  Cardiovascular: Normal rate, regular rhythm, normal heart sounds and intact distal pulses.   Pulmonary/Chest: Effort normal and breath sounds normal. No respiratory distress. He has no rales. He exhibits no tenderness and no bony tenderness.  Abdominal: Soft. Normal appearance. He exhibits no mass. There is no tenderness. There is no guarding.  Musculoskeletal: Normal range of motion. He exhibits edema (left leg edema 1-2+; which he states is his baseline. No edema of the right leg.).  Neurological: He is alert and oriented to person, place, and time. He has normal strength. No cranial nerve deficit or sensory deficit. He exhibits normal muscle tone. Coordination normal.  Skin: Skin is warm, dry and intact.  Psychiatric: He has a normal mood and affect. His behavior is normal. Judgment and thought content normal.    ED Course  Procedures (including critical care time)  Medications  azithromycin (ZITHROMAX) 500 mg in dextrose 5 % 250 mL IVPB (not administered)  cefTRIAXone (ROCEPHIN) 1 g in dextrose 5 % 50 mL IVPB (not administered)  heparin bolus via infusion 4,000 Units (not administered)  heparin ADULT infusion 100 units/mL (25000 units/250 mL) (not administered)  cefTRIAXone (ROCEPHIN) 1 g in dextrose 5 % 50 mL IVPB (not administered)  azithromycin  (ZITHROMAX) tablet 500 mg (not administered)  aspirin  chewable tablet 324 mg (324 mg Oral Given 08/18/12 0342)   04:45- case discussed with the admitting hospitalist, Dr. Betti Cruz. He will see the patient in the emergency department and write admitting orders.   Date: 02/29/2012  Rate: 77  Rhythm: Atrial pacing  QRS Axis: normal  PR and QT Intervals: QTC is prolonged  ST/T Wave abnormalities: normal  PR and QRS Conduction Disutrbances:QTC is prolonged  Narrative Interpretation:   Old EKG Reviewed: changes noted- QTc is longer   Labs Reviewed  PRO B NATRIURETIC PEPTIDE - Abnormal; Notable for the following:    Pro B Natriuretic peptide (BNP) 1651.0 (*)    All other components within normal limits  CBC WITH DIFFERENTIAL - Abnormal; Notable for the following:    RBC 3.86 (*)    Hemoglobin 12.1 (*)    HCT 35.3 (*)    All other components within normal limits  D-DIMER, QUANTITATIVE - Abnormal; Notable for the following:    D-Dimer, Quant 1.47 (*)    All other components within normal limits  POCT I-STAT, CHEM 8 - Abnormal; Notable for the following:    Creatinine, Ser 1.60 (*)    Glucose, Bld 111 (*)    Hemoglobin 12.6 (*)    HCT 37.0 (*)    All other components within normal limits  POCT I-STAT TROPONIN I - Abnormal; Notable for the following:    Troponin i, poc 0.12 (*)    All other components within normal limits  POCT I-STAT TROPONIN I - Abnormal; Notable for the following:    Troponin i, poc 0.16 (*)    All other components within normal limits  CULTURE, EXPECTORATED SPUTUM-ASSESSMENT  GRAM STAIN  TROPONIN I  PROTIME-INR  TROPONIN I  HEPARIN LEVEL (UNFRACTIONATED)  LEGIONELLA ANTIGEN, URINE  STREP PNEUMONIAE URINARY ANTIGEN   Dg Chest Port 1 View  08/18/2012  *RADIOLOGY REPORT*  Clinical Data: Shortness of breath and cough.  PORTABLE CHEST - 1 VIEW  Comparison: Chest radiograph performed 07/06/2012  Findings: The lungs are well-aerated.  Patchy bibasilar airspace  opacification, worse on the right, concerning for multifocal pneumonia.  No definite pleural effusion or pneumothorax is seen, though the right costophrenic angle is incompletely imaged on this study.  The cardiomediastinal silhouette is normal in size.  A pacemaker is noted overlying the left chest wall, with partially imaged leads ending overlying the right atrium and right ventricle.  No acute osseous abnormalities are seen.  IMPRESSION: Patchy bibasilar airspace opacification, worse on the right, concerning for multifocal pneumonia.   Original Report Authenticated By: Tonia Ghent, M.D.    Nursing Notes Reviewed/ Care Coordinated, and agree without changes. Applicable Imaging Reviewed.  Interpretation of Laboratory Data incorporated into ED treatment  1. Dyspnea   2. Hypoxia   3. Community acquired pneumonia       MDM  Non-specific cough with chest tightness, and hypoxia. D-dimer is elevated, with PE as a possibility. Creatinine is elevated, and a baseline; so, CT angiogram could not be done. Patient is to be admitted for further evaluation, and monitoring.       Flint Melter, MD 08/18/12 [phone]

## 2012-08-18 NOTE — ED Notes (Signed)
Unable to get IV. IV team paged.

## 2012-08-18 NOTE — Progress Notes (Signed)
Patient voided 300 cc in urinal.

## 2012-08-18 NOTE — H&P (Signed)
Patient's PCP: [name], MD Patient's cardiologist: Dr. 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 is a 79 y.o. African American male with history of chronic diastolic congestive heart failure with ejection fraction of 50-55 % based on a 2-D echocardiogram on 05/26/2011, hypertension, hyperlipidemia, coronary artery disease, anemia, status post pacemaker, and pneumonia who presents with the above complaints.  He reports that he has chronic cough at baseline.  Recently he has had his weight increased to 200 pounds as a result of contact his primary care physician's office who had instructed him to take an extra dose of Lasix and subsequently since then his weight has dropped back down to his baseline of 196 pounds.  During the night when he woke up to go to the bathroom he had a coughing episode and felt short of breath.  With this coughing episode he had diaphoresis as a result he presented to the ER for further evaluation.  He has not had any fevers or chills at home.  Denies any chest pain.  Denies any abdominal pain.  Does report being constipated.  Denies any headaches or vision changes.  Lab showed his d-dimer was elevated and imaging showed patchy bibasilar airspace opacification worse in the right.  As a result the hospitalist service was asked to admit the patient for further care and management.  Review of Systems: All systems reviewed with the patient and positive as per history of present illness, otherwise all other systems are negative.  Past Medical History  Diagnosis Date  . CHF (congestive heart failure)   . Diabetes mellitus   . Hypertension   . Hyperlipidemia   . Coronary artery disease   . Spinal stenosis   . Dysrhythmia   . Pneumonia   . Pacemaker   . Arthritis   . Anemia    Past Surgical History  Procedure Laterality Date  . Lumbar laminectomy    . Back surgery    . Total knee arthroplasty    . Quadriceps  tendon repair    . Cataract extraction    . Insert / replace / remove pacemaker    . Carotid endarterectomy     Family History  Problem Relation Age of Onset  . Multiple myeloma Father   . Hypertension Mother    History   Social History  . Marital Status: Widowed    Spouse Name: N/A    Number of Children: N/A  . Years of Education: N/A   Occupational History  . Not on file.   Social History Main Topics  . Smoking status: Never Smoker   . Smokeless tobacco: Never Used  . Alcohol Use: Yes     Comment: Cocktails occasionally  . Drug Use: No  . Sexually Active: Not Currently   Other Topics Concern  . Not on file   Social History Narrative  . No narrative on file   Allergies: Statins  Home Meds: Prior to Admission medications   Medication Sig Start Date End Date Taking? Authorizing Provider  amLODipine (NORVASC) 10 MG tablet Take 10 mg by mouth daily.   Yes Historical Provider, MD  aspirin 81 MG tablet Take 162 mg by mouth 2 (two) times daily.    Yes Historical Provider, MD  dutasteride (AVODART) 0.5 MG capsule Take 0.5 mg by mouth daily.   Yes Historical Provider, MD  ferrous sulfate 325 (65 FE) MG tablet Take 325 mg by mouth daily with breakfast.  Yes Historical Provider, MD  furosemide (LASIX) 80 MG tablet Take 80-120 mg by mouth 2 (two) times daily.    Yes Historical Provider, MD  HYDROcodone-acetaminophen (LORTAB) 7.5-500 MG per tablet Take 1 tablet by mouth 4 (four) times daily as needed for pain. For pain 02/11/12  Yes Dorothea Ogle, MD  insulin glargine (LANTUS) 100 UNIT/ML injection Inject 13 Units into the skin at bedtime.    Yes Historical Provider, MD  insulin lispro (HUMALOG) 100 UNIT/ML injection Inject 7-9 Units into the skin 3 (three) times daily before meals. 7 units with breakfast and lunch, 9 units after supper   Yes Historical Provider, MD  isosorbide mononitrate (IMDUR) 30 MG 24 hr tablet Take 30 mg by mouth daily.   Yes Historical Provider, MD   Multiple Vitamin (MULTIVITAMIN WITH MINERALS) TABS Take 1 tablet by mouth daily. Diabetic pak by Ashby Dawes from [employer]   Yes Historical Provider, MD  Polyethyl Glycol-Propyl Glycol (SYSTANE OP) Place 1 drop into both eyes 2 (two) times daily as needed. For dry eyes   Yes Historical Provider, MD  polyethylene glycol (MIRALAX / GLYCOLAX) packet Take 17 g by mouth daily as needed. For constipation   Yes Historical Provider, MD  potassium chloride SA (K-DUR,KLOR-CON) 20 MEQ tablet Take 20 mEq by mouth daily.   Yes Historical Provider, MD  Tafluprost (ZIOPTAN) 0.0015 % SOLN Place 1 drop into both eyes every other day. ONLY PRESERVATIVE FREE   Yes Historical Provider, MD  valsartan (DIOVAN) 160 MG tablet Take 160 mg by mouth every morning.    Yes Historical Provider, MD    Physical Exam: Blood pressure 150/63, pulse 70, temperature 98 F (36.7 C), temperature source Oral, height 5' 6.93" (1.7 m), weight 91.2 kg (201 lb 1 oz), SpO2 99.00%. General: Awake, Oriented x3, No acute distress. HEENT: EOMI, Moist mucous membranes Neck: Supple CV: S1 and S2 Lungs:  Scattered crackles at bases bilaterally, good respiratory effort with good air movement.   Abdomen: Soft, Nontender, Nondistended, +bowel sounds. Ext: Good pulses.   1-2+ lower extremity edema on the left (which is a at baseline for the patient given chronic wound that he has on the left leg).  Trace edema. No clubbing or cyanosis noted. Neuro: Cranial Nerves II-XII grossly intact. Has 5/5 motor strength in upper and lower extremities.  Lab results:  Recent Labs  08/18/12 0309  NA 139  K 3.8  CL 98  GLUCOSE 111*  BUN 23  CREATININE 1.60*   No results found for this basename: AST, ALT, ALKPHOS, BILITOT, PROT, ALBUMIN,  in the last 72 hours No results found for this basename: LIPASE, AMYLASE,  in the last 72 hours  Recent Labs  08/18/12 0244 08/18/12 0309  WBC 6.6  --   NEUTROABS 4.0  --   HGB 12.1* 12.6*  HCT 35.3* 37.0*  MCV 91.5   --   PLT 222  --     Recent Labs  08/18/12 0414  TROPONINI <0.30   No components found with this basename: POCBNP,   Recent Labs  08/18/12 0244  DDIMER 1.47*   No results found for this basename: HGBA1C,  in the last 72 hours No results found for this basename: CHOL, HDL, LDLCALC, TRIG, CHOLHDL, LDLDIRECT,  in the last 72 hours No results found for this basename: TSH, T4TOTAL, FREET3, T3FREE, THYROIDAB,  in the last 72 hours No results found for this basename: VITAMINB12, FOLATE, FERRITIN, TIBC, IRON, RETICCTPCT,  in the last 72 hours Imaging results:  Dg Chest Port 1 View  08/18/2012  *RADIOLOGY REPORT*  Clinical Data: Shortness of breath and cough.  PORTABLE CHEST - 1 VIEW  Comparison: Chest radiograph performed 07/06/2012  Findings: The lungs are well-aerated.  Patchy bibasilar airspace opacification, worse on the right, concerning for multifocal pneumonia.  No definite pleural effusion or pneumothorax is seen, though the right costophrenic angle is incompletely imaged on this study.  The cardiomediastinal silhouette is normal in size.  A pacemaker is noted overlying the left chest wall, with partially imaged leads ending overlying the right atrium and right ventricle.  No acute osseous abnormalities are seen.  IMPRESSION: Patchy bibasilar airspace opacification, worse on the right, concerning for multifocal pneumonia.   Original Report Authenticated By: Tonia Ghent, M.D.    Other results: EKG:  Paced rhythm with heart rate of 77.  Assessment & Plan by Problem: Shortness of breath/acute respiratory failure Etiology unclear.  Imaging suggestive of multifocal pneumonia with patchy bibasilar airspace opacification.  Start patient on ceftriaxone and azithromycin for community-acquired pneumonia.  Previous hospitalization was in January of 2014 which is greater than 90 days.  Patient's d-dimer was elevated at 1.47, start IV heparin until CT scan is performed to assess probability of  pulmonary embolism.  Patient's point-of-care troponin is elevated however lab troponin is negative.  Admit the patient to telemetry and cycle cardiac enzymes.  Chronic diastolic congestive heart failure Patient appears compensated at this time.  Continue home furosemide dose.  ProBNP lower than lab value in February.  Chronic kidney disease stage III Renal function slightly elevated from baseline creatinine of 1.4-1.5.  Continue to monitor.  Hypertension Continue home antihypertensive medications.  Diabetes type 2 uncontrolled with complications Blood sugar stable so far.  Sliding scale insulin.  Diabetic diet.  Continue home insulin regimen.  Coronary artery disease Continue aspirin.  Anemia Likely due to iron deficiency anemia.  Continue home care sulfate.  Constipation Chronic problem.  Continue bowel regimen.  Prophylaxis Patient anticoagulated on therapeutic IV heparin.  CODE STATUS Full code.  Discussed with the patient and family at the time of admission.  Disposition Admit the patient to telemetry as mentation.  Time spent on admission, talking to the patient, and coordinating care was: 60 mins.  [name] A, MD 08/18/2012, 5:03 AM

## 2012-08-18 NOTE — ED Notes (Signed)
Per EMS: Pt reports exertional SOB when ambulating to restroom this AM. 88% RA. Placed on 4L to 100%. Pt reports taking Lasix yesterday and decreased urine output. Denies pain. Hx: CHF. 160/88. 82 bpm. VSS. AO  X4.

## 2012-08-18 NOTE — ED Notes (Signed)
Pt reports that when using the restroom this AM he had sudden SOB, with a rattling cough. Pt reports it came in episodes and resolved on its own in appx 20 mins. Denies any pain. Denies SOB at this time. Lung sounds clear. Family at bedside. Per daughter pt became diaphoretic while coughing.

## 2012-08-18 NOTE — Significant Event (Signed)
[facility] Event Note  Overview: called for respiratory distress Time Called: 0757 Arrival Time: 0800 Event Type: Respiratory  Initial Focused Assessment:  Upon arrival to patients room RN at bedside.  Patient sitting on edge of bed  Sitting in tripod position.  Patient diaphoretic, SOB, increased WOB Breath sounds coarse with crackles, sats 92% on 5 lpm nasal cannula   Interventions:  Spoke to Dr. Suanne Marker, patient placed on bipap, 100mg  IV lasix started.  Dr. Suanne Marker at bedside.  Patient breathing much easier, sats 95%   Event Summary: Name of Physician Notified: Dr. Suanne Marker at 0820    at    Outcome: Stayed in room and stabalized  Event End Time: 1610  [place]

## 2012-08-18 NOTE — Progress Notes (Signed)
Hypoglycemic Event  CBG: 54  Treatment: 15 GM carbohydrate snack  Symptoms: Sweaty  Follow-up CBG: Time:2215 CBG Result: 77  Possible Reasons for Event: Unknown  Comments/MD notified:Kirby, NP notified 2215    Donnamae Jude  Remember to initiate Hypoglycemia Order Set & complete

## 2012-08-18 NOTE — Progress Notes (Signed)
Orders given for 100mg  IV Lasix and Bipap temporarily.  Patient's O2 sats are 97-100%, Lasix has started and patient is breathing better; will continue to monitor patient. Lorretta Harp RN

## 2012-08-18 NOTE — Progress Notes (Signed)
Patient states he feels better after IV lasix and can breath better.  IV heparin and antibiotics has been restarted; will continue to monitor patient.  Lorretta Harp RN

## 2012-08-18 NOTE — Progress Notes (Signed)
RT called to 4700 by Angelique Blonder from [facility] to place pt on BIPAP. Upon arrival, pt with increased WOB, and 92% on 5L [place]. Pt placed on BIPAP with current settings and seems to be tolerating well. RT will continue to monitor.

## 2012-08-18 NOTE — Progress Notes (Signed)
Utilization Review Completed Erva Koke J. Tattianna Schnarr, RN, BSN, NCM [phone]  

## 2012-08-18 NOTE — Progress Notes (Signed)
[facility] PROGRESS NOTE  [name]. ZOX:[id number] DOB: 12/09/1918 DOA: 08/18/2012 PCP: [name], MD  Assessment/Plan: Resp distress/acute respiratory failure  Etiology unclear. Imaging suggestive of multifocal pneumonia with patchy bibasilar airspace opacification.  -BIPAP, IV lasix 100mg  (he is on 120mg  qm, and 80 qpm of lasix outpt) -cardiac enzymes so far neg -more likely secondary to severe acute on chronic resp failure, But will continue empiric antibiotics for possible PNA now pending repeat CXR in am- he has no cough, leukocytosis nor fever -VQ scan done to eval for PE neg, will d/c heparin   Chronic diastolic congestive heart failure  -as discussed above, I have consulted cards- pt followed by Dr Katrinka Blazing Chronic kidney disease stage III  Cr 1.6 today,from baseline creatinine of 1.4-1.5. Continue to monitor.  Hypertension  Continue home antihypertensive medications.  Diabetes type 2 uncontrolled with complications  Blood sugar stable so far. Sliding scale insulin. Diabetic diet. Continue home insulin regimen.  Coronary artery disease  Continue aspirin.  Anemia  Likely due to iron deficiency anemia. Continue home care sulfate.  Constipation  Chronic problem. Continue bowel regimen.  Prophylaxis  Change to lovenox for DVT PROPHYLAXIS   Code Status: full Family Communication: directly with pt at bedside. Disposition Plan: to home when medically stable   Consultants:  cardiology  Procedures:  none  Antibiotics:  Rocephin and zithromax  HPI/Subjective: Notified per nsg this am that pt in resp distresss. Pt denies CP, no cough.  Objective: Filed Vitals:   08/18/12 0233 08/18/12 0300 08/18/12 0532 08/18/12 0815  BP:   156/83   Pulse: 70  76 72  Temp: 98 F (36.7 C)  97.5 F (36.4 C)   TempSrc: Oral  Oral   Resp:   18 30  Height:  5' 6.93" (1.7 m) 5\' 7"  (1.702 m)   Weight:  91.2 kg (201 lb 1 oz)    SpO2: 99%  97% 100%     Intake/Output Summary (Last 24 hours) at 08/18/12 0858 Last data filed at 08/18/12 8119  Gross per 24 hour  Intake      0 ml  Output      0 ml  Net      0 ml   Filed Weights   08/18/12 0300  Weight: 91.2 kg (201 lb 1 oz)    Exam:   General:  Elderly male with BIPAP on, sitting up in bed. Alert and oriented x3  Cardiovascular: RRR, nl S1S2  Respiratory: Bil lower lung field crackles  Abdomen: soft, +BS, NT/ND  Musculoskeletal: L(chronic)>R LE edema, no cyanosis  Data Reviewed: Basic Metabolic Panel:  Recent Labs Lab 08/18/12 0309  NA 139  K 3.8  CL 98  GLUCOSE 111*  BUN 23  CREATININE 1.60*   Liver Function Tests: No results found for this basename: AST, ALT, ALKPHOS, BILITOT, PROT, ALBUMIN,  in the last 168 hours No results found for this basename: LIPASE, AMYLASE,  in the last 168 hours No results found for this basename: AMMONIA,  in the last 168 hours CBC:  Recent Labs Lab 08/18/12 0244 08/18/12 0309  WBC 6.6  --   NEUTROABS 4.0  --   HGB 12.1* 12.6*  HCT 35.3* 37.0*  MCV 91.5  --   PLT 222  --    Cardiac Enzymes:  Recent Labs Lab 08/18/12 0414  TROPONINI <0.30   BNP (last 3 results)  Recent Labs  06/04/12 2150 07/06/12 0729 08/18/12 0244  PROBNP 1507.0* 2295.0* 1651.0*  CBG:  Recent Labs Lab 08/18/12 0543  GLUCAP 123*    No results found for this or any previous visit (from the past 240 hour(s)).   Studies: Dg Chest Port 1 View  08/18/2012  *RADIOLOGY REPORT*  Clinical Data: Shortness of breath and cough.  PORTABLE CHEST - 1 VIEW  Comparison: Chest radiograph performed 07/06/2012  Findings: The lungs are well-aerated.  Patchy bibasilar airspace opacification, worse on the right, concerning for multifocal pneumonia.  No definite pleural effusion or pneumothorax is seen, though the right costophrenic angle is incompletely imaged on this study.  The cardiomediastinal silhouette is normal in size.  A pacemaker is noted overlying  the left chest wall, with partially imaged leads ending overlying the right atrium and right ventricle.  No acute osseous abnormalities are seen.  IMPRESSION: Patchy bibasilar airspace opacification, worse on the right, concerning for multifocal pneumonia.   Original Report Authenticated By: Tonia Ghent, M.D.     Scheduled Meds: . amLODipine  10 mg Oral Daily  . aspirin EC  162 mg Oral BID  . azithromycin (ZITHROMAX) 500 MG IVPB  500 mg Intravenous Once  . [START ON 08/19/2012] azithromycin  500 mg Oral Q24H  . cefTRIAXone (ROCEPHIN)  IV  1 g Intravenous Q24H  . dutasteride  0.5 mg Oral Daily  . ferrous sulfate  325 mg Oral Q breakfast  . furosemide      . furosemide  100 mg Intravenous Once  . furosemide  120 mg Oral Q24H   And  . furosemide  80 mg Oral QPM  . insulin aspart  0-5 Units Subcutaneous QHS  . insulin aspart  0-9 Units Subcutaneous TID WC  . insulin aspart  7 Units Subcutaneous BID WC  . insulin aspart  9 Units Subcutaneous QAC supper  . insulin glargine  13 Units Subcutaneous QHS  . irbesartan  150 mg Oral Daily  . isosorbide mononitrate  30 mg Oral Daily  . [START ON 08/19/2012] latanoprost  1 drop Both Eyes Q48H  . multivitamin with minerals  1 tablet Oral Daily  . potassium chloride SA  20 mEq Oral Daily   Continuous Infusions: . heparin 1,400 Units/hr (08/18/12 0538)    Principal Problem:   SOB (shortness of breath) Active Problems:   Chronic diastolic CHF (congestive heart failure)   Hypertension   Diabetes mellitus   Acute respiratory failure   CAP (community acquired pneumonia)   CKD (chronic kidney disease), stage III    Critical care time>    Anzlee Hinesley C  [facility] Pager [phone]. If 7PM-7AM, please contact night-coverage at [email or web address], password [facility] 08/18/2012, 8:58 AM  LOS: 0 days

## 2012-08-18 NOTE — Progress Notes (Signed)
ANTICOAGULATION CONSULT NOTE - Initial Consult  Pharmacy Consult for heparin Indication: R/o pulmonary embolism   Allergies  Allergen Reactions  . Statins Other (See Comments)    rhabdomyolisis    Patient Measurements: Height: 5' 6.93" (170 cm) Weight: 201 lb 1 oz (91.2 kg) (Per 07/06/12 documentation) IBW/kg (Calculated) : 65.94 Heparin Dosing Weight: 85 kg   Vital Signs: Temp: 98 F (36.7 C) (04/07 0233) Temp src: Oral (04/07 0233) Pulse Rate: 70 (04/07 0233)  Labs:  Recent Labs  08/18/12 0244 08/18/12 0309  HGB 12.1* 12.6*  HCT 35.3* 37.0*  PLT 222  --   CREATININE  --  1.60*    Estimated Creatinine Clearance: 31.7 ml/min (by C-G formula based on Cr of 1.6).   Medical History: Past Medical History  Diagnosis Date  . CHF (congestive heart failure)   . Diabetes mellitus   . Hypertension   . Hyperlipidemia   . Coronary artery disease   . Spinal stenosis   . Dysrhythmia   . Pneumonia   . Pacemaker   . Arthritis   . Anemia     Medications:  Scheduled:  . [COMPLETED] aspirin  324 mg Oral Once  . [COMPLETED] rivaroxaban  15 mg Oral Once    Assessment: 79 yo male presented with shortness of breath. Noted D-dimer of 1.47 and SrCr 1.6 mg/dL. Pharmacy to manage IV heparin for r/o pulmonary embolism. Baseline Hgb 12.1 and platelet 222.   Goal of Therapy:  Heparin level 0.3-0.7 units/ml Monitor platelets by anticoagulation protocol: Yes   Plan:  1. Heparin 4000 unit IV bolus x 1, then IV infusion of 1400 units/hr.  2. Heparin level in 8 hours.  3. Daily CBC, heparin level   Emeline Gins 08/18/2012,4:27 AM

## 2012-08-18 NOTE — Progress Notes (Signed)
Patient is now resting and state he feels fine.  Patient's breathing is better and O2 sats are 100% on 3L [place]; will continue to monitor patient. Lorretta Harp RN

## 2012-08-18 NOTE — Consult Note (Signed)
Admit date: 08/18/2012 Referring Physician  Dr. 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 Earlier this morning, he was admitted by Dr. Betti Cruz of hospitalist team after feeling increasing shortness of breath. He is quite vigilant about his weights and he would increase to 200 pounds and recently took an extra Lasix dose and was down to his baseline of 196. Despite this, through the night he began to have a coughing episode in the bathroom and felt significant shortness of breath. He became diaphoretic and went to the emergency room for further evaluation. No recent fevers, chills, chest pain at home. No abnormal discomfort although has issues with chronic constipation. No neurologic symptoms.  His BNP was elevated at 1651 however this is decreased from previous 2000. His point-of-care troponin was also mildly elevated however serum troponins have both been in normal range. Earlier this morning at approximately 8 AM he felt significant shortness of breath, anxiety, was diaphoretic, sitting in try pot position satting 92% with 5 L. BiPAP was moved in the patient's room, 100 mg of IV Lasix was administered and shortly thereafter he became more comfortable. Dr. Michaelle Copas prior clinic encounter note reviewed. EKG shows atrial pacing with left bundle branch block.    PMH:   Past Medical History  Diagnosis Date  . CHF (congestive heart failure)   . Diabetes mellitus   . Hypertension   . Hyperlipidemia   . Coronary artery disease   . Spinal stenosis   . Dysrhythmia   . Pneumonia   . Pacemaker   . Arthritis   . Anemia     PSH:   Past Surgical History  Procedure Laterality Date  . Lumbar laminectomy    . Back surgery    . Total knee  arthroplasty    . Quadriceps tendon repair    . Cataract extraction    . Insert / replace / remove pacemaker    . Carotid endarterectomy     Allergies:  Statins Prior to Admit Meds:   Prescriptions prior to admission  Medication Sig Dispense Refill  . amLODipine (NORVASC) 10 MG tablet Take 10 mg by mouth daily.      [name] aspirin 81 MG tablet Take 162 mg by mouth 2 (two) times daily.       [name] dutasteride (AVODART) 0.5 MG capsule Take 0.5 mg by mouth daily.      . ferrous sulfate 325 (65 FE) MG tablet Take 325 mg by mouth daily with breakfast.      . furosemide (LASIX) 80 MG tablet Take 80-120 mg by mouth 2 (two) times daily.       [name] HYDROcodone-acetaminophen (LORTAB) 7.5-500 MG per tablet Take 1 tablet by mouth 4 (four) times daily as needed for pain. For pain      . insulin glargine (LANTUS) 100 UNIT/ML injection Inject 13 Units into the skin at bedtime.       . insulin lispro (HUMALOG) 100 UNIT/ML injection Inject 7-9 Units into the skin 3 (three) times daily before meals. 7 units with breakfast and lunch, 9 units after supper      . isosorbide mononitrate (IMDUR) 30 MG 24 hr tablet Take 30 mg by mouth daily.      . Multiple Vitamin (MULTIVITAMIN WITH MINERALS) TABS Take 1 tablet by mouth daily.  Diabetic pak by Ashby Dawes from [employer]      . Polyethyl Glycol-Propyl Glycol (SYSTANE OP) Place 1 drop into both eyes 2 (two) times daily as needed. For dry eyes      . polyethylene glycol (MIRALAX / GLYCOLAX) packet Take 17 g by mouth daily as needed. For constipation      . potassium chloride SA (K-DUR,KLOR-CON) 20 MEQ tablet Take 20 mEq by mouth daily.      . Tafluprost (ZIOPTAN) 0.0015 % SOLN Place 1 drop into both eyes every other day. ONLY PRESERVATIVE FREE      . valsartan (DIOVAN) 160 MG tablet Take 160 mg by mouth every morning.        Fam HX:    Family History  Problem Relation Age of Onset  . Multiple myeloma Father   . Hypertension Mother    Social HX:    History   Social History  .  Marital Status: Widowed    Spouse Name: N/A    Number of Children: N/A  . Years of Education: N/A   Occupational History  . Not on file.   Social History Main Topics  . Smoking status: Never Smoker   . Smokeless tobacco: Never Used  . Alcohol Use: Yes     Comment: Cocktails occasionally  . Drug Use: No  . Sexually Active: Not Currently   Other Topics Concern  . Not on file   Social History Narrative  . No narrative on file     ROS:  All 11 ROS were addressed and are negative except what is stated in the HPI  Physical Exam: Blood pressure 118/57, pulse 72, temperature 97.5 F (36.4 C), temperature source Oral, resp. rate 30, height 5\' 7"  (1.702 m), weight 91.2 kg (201 lb 1 oz), SpO2 100.00%.    General: Elderly but well developed, well nourished, in no acute distress Head: Eyes PERRLA, No xanthomas.   Normal cephalic and atramatic  Lungs:  Bibasilar crackles, no wheezes, minimally increased respiratory effort currently Heart:   HRRR S1 S2, +S4. Pulses are 2+ & equal. 1/6 SEM.           No carotid bruit. No JVD.  No abdominal bruits.  Abdomen: Bowel sounds are positive, abdomen soft and non-tender without masses. No hepatosplenomegaly. Msk:  Back normal. Normal strength and tone for age. Extremities:  No clubbing, cyanosis or edema.  DP +1 Neuro: Alert and oriented X 3, non-focal, MAE x 4 GU: Deferred Rectal: Deferred Psych:  Good affect, responds appropriately    Labs:   Lab Results  Component Value Date   WBC 6.6 08/18/2012   HGB 12.6* 08/18/2012   HCT 37.0* 08/18/2012   MCV 91.5 08/18/2012   PLT 222 08/18/2012    Recent Labs Lab 08/18/12 0309  NA 139  K 3.8  CL 98  BUN 23  CREATININE 1.60*  GLUCOSE 111*   Lab Results  Component Value Date   CKTOTAL 452* 05/26/2011   CKMB 3.3 05/26/2011   TROPONINI <0.30 08/18/2012      Radiology:  Dg Chest Port 1 View  08/18/2012  *RADIOLOGY REPORT*  Clinical Data: Shortness of breath and cough.  PORTABLE CHEST - 1 VIEW   Comparison: Chest radiograph performed 07/06/2012  Findings: The lungs are well-aerated.  Patchy bibasilar airspace opacification, worse on the right, concerning for multifocal pneumonia.  No definite pleural effusion or pneumothorax is seen, though the right costophrenic angle is incompletely imaged on this study.  The cardiomediastinal silhouette is  normal in size.  A pacemaker is noted overlying the left chest wall, with partially imaged leads ending overlying the right atrium and right ventricle.  No acute osseous abnormalities are seen.  IMPRESSION: Patchy bibasilar airspace opacification, worse on the right, concerning for multifocal pneumonia.   Original Report Authenticated By: Tonia Ghent, M.D.    Personally viewed.  EKG:  Atrial pacing, left bundle branch block. Personally viewed.   ASSESSMENT/PLAN:   79 year old male with acute on chronic diastolic heart failure, elevated d-dimer, no leukocytosis, no fever with patchy infiltrate, multifocal on chest x-ray.  1. Acute on chronic diastolic heart failure-agree with current plan to administer IV Lasix. He has received 100 mg earlier this morning which significantly helped his symptoms. I would give him another dose of Lasix 100mg  IV this after noon. He was also quite anxious surrounding the episode and this seems to have calmed down significantly. VQ scan is currently pending. D-dimer was elevated. IV heparin is being utilized currently. It VQ scan low probability, I would discontinue full dose heparin. Serum troponin is also reassuring, normal albeit abnormal point-of-care troponin. Blood pressure is currently under good control. With the possibility of multilobar pneumonia as indicated by radiology, currently on IV antibiotic treatment.  Change back to PO lasix in am. Possible DC in am if feels well.   2. CAD-continue aspirin, isosorbide. serum troponin reassuring.   3. Diabetes-per primary team  4. Possible pneumonia-IV antibiotics as  above. Likely edema pattern on CXR when looking at the whole situation.   5. Elevated d-dimer-excluding pulmonary embolism with VQ scan.  6. Permanent pacemaker-functioning well.  Discussed with Dr. Donna Bernard.    Donato Schultz, MD  08/18/2012  11:21 AM

## 2012-08-18 NOTE — Progress Notes (Signed)
Patient is in a panic and states he can not breath and is having chest pain, patient is very anxious and refused any fluids including his heparin drip; patient states he needs IV lasix now, rapid response called and MD paged; will continue to monitor patient.  Lorretta Harp RN

## 2012-08-18 NOTE — Progress Notes (Signed)
Pt had been placed on Bipap early this am for fluid overload. Pt is tolerating [place] well at this time with no distress. Bipap is not needed at this time. RN aware.

## 2012-08-19 ENCOUNTER — Other Ambulatory Visit: Payer: Self-pay | Admitting: Neurology

## 2012-08-19 ENCOUNTER — Inpatient Hospital Stay (HOSPITAL_COMMUNITY): Payer: Medicare Other

## 2012-08-19 DIAGNOSIS — J189 Pneumonia, unspecified organism: Secondary | ICD-10-CM

## 2012-08-19 LAB — CBC
HCT: 34.1 % — ABNORMAL LOW (ref 39.0–52.0)
Hemoglobin: 11.6 g/dL — ABNORMAL LOW (ref 13.0–17.0)
MCH: 31.2 pg (ref 26.0–34.0)
MCHC: 34 g/dL (ref 30.0–36.0)

## 2012-08-19 LAB — GLUCOSE, CAPILLARY
Glucose-Capillary: 158 mg/dL — ABNORMAL HIGH (ref 70–99)
Glucose-Capillary: 103 mg/dL — ABNORMAL HIGH (ref 70–99)

## 2012-08-19 LAB — BASIC METABOLIC PANEL
BUN: 25 mg/dL — ABNORMAL HIGH (ref 6–23)
CO2: 31 mEq/L (ref 19–32)
Calcium: 9.1 mg/dL (ref 8.4–10.5)
GFR calc non Af Amer: 37 mL/min — ABNORMAL LOW (ref >90)
Glucose, Bld: 137 mg/dL — ABNORMAL HIGH (ref 70–99)

## 2012-08-19 LAB — STREP PNEUMONIAE URINARY ANTIGEN: Strep Pneumo Urinary Antigen: NEGATIVE

## 2012-08-19 MED ORDER — FUROSEMIDE 80 MG PO TABS: 120.0000 mg | ORAL_TABLET | Freq: Two times a day (BID) | ORAL | Status: DC

## 2012-08-19 MED ORDER — MOXIFLOXACIN HCL 400 MG PO TABS: 400.0000 mg | ORAL_TABLET | Freq: Every day | ORAL | Status: DC

## 2012-08-19 MED ORDER — FUROSEMIDE 80 MG PO TABS: 120.0000 mg | ORAL_TABLET | Freq: Every day | ORAL | Status: DC

## 2012-08-19 MED ORDER — HYDROCODONE-ACETAMINOPHEN 7.5-325 MG PO TABS: 1.0000 | ORAL_TABLET | Freq: Four times a day (QID) | ORAL | Status: DC | PRN

## 2012-08-19 MED ORDER — FUROSEMIDE 80 MG PO TABS
120.0000 mg | ORAL_TABLET | Freq: Every day | ORAL | Status: DC
  Filled 2012-08-19: qty 1

## 2012-08-19 MED ORDER — BENZONATATE 100 MG PO CAPS: 100.0000 mg | ORAL_CAPSULE | Freq: Three times a day (TID) | ORAL | Status: DC | PRN

## 2012-08-19 NOTE — Progress Notes (Addendum)
Patient Name: [name]. Date of Encounter: 08/19/2012    SUBJECTIVE: Feels better. Lept well.  TELEMETRY:  A pacing.: Filed Vitals:   08/18/12 1420 08/18/12 2148 08/19/12 0553 08/19/12 0730  BP: 115/66 156/68 147/65 146/67  Pulse: 70 73 70 74  Temp:  97.9 F (36.6 C) 97.8 F (36.6 C) 98.4 F (36.9 C)  TempSrc:  Oral Oral Oral  Resp: 18 18 19 18   Height:      Weight:   90.5 kg (199 lb 8.3 oz)   SpO2: 100% 100% 93% 94%    Intake/Output Summary (Last 24 hours) at 08/19/12 0757 Last data filed at 08/19/12 0615  Gross per 24 hour  Intake 1223.27 ml  Output   1378 ml  Net -154.73 ml   Wt from 91.2  Kg on admission to 90.5 kg this AM  LABS: Basic Metabolic Panel:  Recent Labs  40/98/11 0309 08/19/12 0620  NA 139 136  K 3.8 4.0  CL 98 96  CO2  --  31  GLUCOSE 111* 137*  BUN 23 25*  CREATININE 1.60* 1.56*  CALCIUM  --  9.1   CBC:  Recent Labs  08/18/12 0244 08/18/12 0309 08/19/12 0620  WBC 6.6  --  7.2  NEUTROABS 4.0  --   --   HGB 12.1* 12.6* 11.6*  HCT 35.3* 37.0* 34.1*  MCV 91.5  --  91.7  PLT 222  --  219  No results found for this basename: CHOL, HDL, LDLCALC, TRIG, CHOLHDL, LDLDIRECT,  in the last 72 hours  Radiology/Studies:  Patchy airspace disease  Physical Exam: Blood pressure 146/67, pulse 74, temperature 98.4 F (36.9 C), temperature source Oral, resp. rate 18, height 5\' 7"  (1.702 m), weight 90.5 kg (199 lb 8.3 oz), SpO2 94.00%. Weight change: -0.7 kg (-1 lb 8.7 oz)   Decreased basilar breath sounds  No murmur or gallop.  ASSESSMENT:  1. Acute on chronic diastolic heart failure, becoming increasingly difficult to treat. Despite increasing oral lasix doses and weight monitoring, these episodes continue to occur.  2. CKD, stage 2  Plan:  Increase he baseline diuretic regimen to Furosemide 120 mg BID I will see him in the office on Friday morning at 9A. Selinda Eon 08/19/2012, 7:57 AM

## 2012-08-19 NOTE — Progress Notes (Signed)
Patient's IV and telemetry has been discontinued, patient verbalizes understanding of discharge instructions and will be discharged home with son.  Lorretta Harp RN

## 2012-08-19 NOTE — Discharge Summary (Signed)
Physician Discharge Summary  [name]. ZOX:[id number] DOB: Apr 20, 1919 DOA: 08/18/2012  PCP: [name], MD  Admit date: 08/18/2012 Discharge date: 08/19/2012  Time spent:>30 minutes  Recommendations for Outpatient Follow-up:      Follow-up Information   Follow up with Lesleigh Noe, MD On 08/22/2012. (9:00A)    Contact information:   [address] [place] [address] [phone]     PCP in 1-2weeks, call for appointment upon discharge   Discharge Diagnoses:  Principal Problem:   SOB (shortness of breath) Active Problems:   Chronic diastolic CHF (congestive heart failure)   Hypertension   Diabetes mellitus   Acute respiratory failure   CAP (community acquired pneumonia)   CKD (chronic kidney disease), stage III   Discharge Condition: improved/stable  Diet recommendation: 2g Na Heart healthy  Filed Weights   08/18/12 0300 08/19/12 0553  Weight: 91.2 kg (201 lb 1 oz) 90.5 kg (199 lb 8.3 oz)    History of present illness:  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 African American male with history of chronic diastolic congestive heart failure with ejection fraction of 50-55 % based on a 2-D echocardiogram on 05/26/2011, hypertension, hyperlipidemia, coronary artery disease, anemia, status post pacemaker, and pneumonia who presents with the above complaints. He reports that he has chronic cough at baseline. Recently he has had his weight increased to 200 pounds as a result of contact his primary care physician's office who had instructed him to take an extra dose of Lasix and subsequently since then his weight has dropped back down to his baseline of 196 pounds. During the night when he woke up to go to the bathroom he had a coughing episode and felt short of breath. With this coughing episode he had diaphoresis as a result he presented to the ER for further evaluation. He has not had any fevers or chills at home. Denies any chest pain. Denies any  abdominal pain. Does report being constipated. Denies any headaches or vision changes. Lab showed his d-dimer was elevated and imaging showed patchy bibasilar airspace opacification worse in the right. As a result the hospitalist service was asked to admit the patient for further care and management.   Hospital Course:  Resp distress/acute respiratory failure  DiastolicCHF +/- multifocal pneumonia with patchy bibasilar airspace opacification.  -Following admission patient developed respiratory distress and was placed on BIPAP briefly, and given IV lasix 100mg  (he is on 120mg  qm, and 80 qpm of lasix outpt). His shortness of breath improved with this treatment and the BiPAP was DC'd.  -Cardiac enzymes were obtained and the initial point-of-care troponin was mildly elevated and 0.16, follow the troponins and x2 came back negative. The patient remained chest pain-free during his hospital stay. -Cardiology was consulted and [facility] cardiology saw patient, on followup today Dr. Katrinka Blazing states are okay for discharge from his standpoint and his Lasix had been increased 120 by mouth twice a day and his to follow up on 4/11 as directed. -The patient had a VQ scan because his d-dimer was elevated and it came back negative -more likely secondary to severe acute on chronic diastolic failure, But will continue empiric antibiotics for possible PNA as his repeat chest x-ray this a.m. following diuresis arm just shows minimal improvement in aeration with persistent right greater than left interstitial airspace opacities. -He has remained afebrile and hemodynamically stable with no leukocytosis he will be discharged on oral antibiotics and is to follow up outpatient.  Chronic diastolic congestive heart failure  As above,  pt to follow up by Dr Katrinka Blazing  Chronic kidney disease stage III  Cr 1.56 today,about  baseline creatinine of 1.4-1.5.  Hypertension  Continue home antihypertensive medications.  Diabetes type 2  uncontrolled with complications  Blood sugar stable so far. Diabetic diet. Continue home insulin regimen.  Coronary artery disease  Continue aspirin.  Anemia  Likely due to iron deficiency anemia. Continue home care sulfate.  Constipation  Chronic problem. Continue bowel regimen.    Procedures: none Consultations:  cardiology  Discharge Exam: Filed Vitals:   08/18/12 2148 08/19/12 0553 08/19/12 0730 08/19/12 1006  BP: 156/68 147/65 146/67 137/67  Pulse: 73 70 74 74  Temp: 97.9 F (36.6 C) 97.8 F (36.6 C) 98.4 F (36.9 C)   TempSrc: Oral Oral Oral   Resp: 18 19 18    Height:      Weight:  90.5 kg (199 lb 8.3 oz)    SpO2: 100% 93% 94%   Exam:  General: Elderly male with BIPAP on, sitting up in bed. Alert and oriented x3  Cardiovascular: RRR, nl S1S2  Respiratory: Decreased breath sounds at bases, no crackles or wheezes. Abdomen: soft, +BS, NT/ND  Musculoskeletal: Decreased L(chronic)>R LE edema, no cyanosis   Discharge Instructions  Discharge Orders   Future Orders Complete By Expires     Diet - low sodium heart healthy  As directed     Increase activity slowly  As directed         Medication List    TAKE these medications       amLODipine 10 MG tablet  Commonly known as:  NORVASC  Take 10 mg by mouth daily.     aspirin 81 MG tablet  Take 162 mg by mouth 2 (two) times daily.     benzonatate 100 MG capsule  Commonly known as:  TESSALON PERLES  Take 1 capsule (100 mg total) by mouth 3 (three) times daily as needed for cough.     dutasteride 0.5 MG capsule  Commonly known as:  AVODART  Take 0.5 mg by mouth daily.     ferrous sulfate 325 (65 FE) MG tablet  Take 325 mg by mouth daily with breakfast.     furosemide 80 MG tablet  Commonly known as:  LASIX  Take 1.5 tablets (120 mg total) by mouth 2 (two) times daily.     HYDROcodone-acetaminophen 7.5-500 MG per tablet  Commonly known as:  LORTAB  Take 1 tablet by mouth 4 (four) times daily as needed  for pain. For pain     insulin glargine 100 UNIT/ML injection  Commonly known as:  LANTUS  Inject 13 Units into the skin at bedtime.     insulin lispro 100 UNIT/ML injection  Commonly known as:  HUMALOG  Inject 7-9 Units into the skin 3 (three) times daily before meals. 7 units with breakfast and lunch, 9 units after supper     isosorbide mononitrate 30 MG 24 hr tablet  Commonly known as:  IMDUR  Take 30 mg by mouth daily.     moxifloxacin 400 MG tablet  Commonly known as:  AVELOX  Take 1 tablet (400 mg total) by mouth daily.     multivitamin with minerals Tabs  Take 1 tablet by mouth daily. Diabetic pak by Ashby Dawes from [employer]     polyethylene glycol packet  Commonly known as:  MIRALAX / GLYCOLAX  Take 17 g by mouth daily as needed. For constipation  potassium chloride SA 20 MEQ tablet  Commonly known as:  K-DUR,KLOR-CON  Take 20 mEq by mouth daily.     SYSTANE OP  Place 1 drop into both eyes 2 (two) times daily as needed. For dry eyes     valsartan 160 MG tablet  Commonly known as:  DIOVAN  Take 160 mg by mouth every morning.     ZIOPTAN 0.0015 % Soln  Generic drug:  Tafluprost  Place 1 drop into both eyes every other day. ONLY PRESERVATIVE FREE           Follow-up Information   Follow up with Lesleigh Noe, MD On 08/22/2012. (9:00A)    Contact information:   [address] [place] [address] [phone]        The results of significant diagnostics from this hospitalization (including imaging, microbiology, ancillary and laboratory) are listed below for reference.    Significant Diagnostic Studies: Nm Pulmonary Perf And Vent  08/18/2012  *RADIOLOGY REPORT*  Clinical Data:  Shortness of breath.  Elevated D-dimer levels. Question pulmonary embolism.  NUCLEAR MEDICINE VENTILATION - PERFUSION LUNG SCAN  Technique:  Ventilation images were obtained in multiple projections using inhaled aerosol technetium 99 M DTPA.  Perfusion images  were obtained in multiple projections after intravenous injection of Tc-58m MAA.  Radiopharmaceuticals:  Tc-47m DTPA aerosol and 6.0 mCi Tc-33m MAA.  Comparison: Chest radiographs 08/18/2012 and 07/06/2012.  Findings:  Ventilation:  Ventilation images were acquired in only the anterior and posterior projections. Although limited, no focal ventilation abnormality is identified.  Perfusion:   No wedge shaped peripheral perfusion defects to suggest acute pulmonary embolism.  There is no evidence of ventilation / perfusion mismatch.  IMPRESSION: Very low probability for acute pulmonary embolism.   Original Report Authenticated By: Carey Bullocks, M.D.    Dg Chest Port 1 View  08/19/2012  *RADIOLOGY REPORT*  Clinical Data: Follow up of "infiltrates."  PORTABLE CHEST - 1 VIEW  Comparison: 08/18/2012  Findings: Pacer with leads at right atrium and right ventricle.  No lead discontinuity.  Midline trachea.  Normal heart size.  Mild right hemidiaphragm elevation.  Left costophrenic angle minimally excluded. No pleural effusion or pneumothorax.  Lower lobe predominant interstitial and airspace opacities, minimally improved.  IMPRESSION: Minimal improvement in aeration.  Persistent right greater left interstitial and airspace opacities, which could represent infection or less likely pulmonary edema.   Original Report Authenticated By: Jeronimo Greaves, M.D.    Dg Chest Port 1 View  08/18/2012  *RADIOLOGY REPORT*  Clinical Data: Shortness of breath and cough.  PORTABLE CHEST - 1 VIEW  Comparison: Chest radiograph performed 07/06/2012  Findings: The lungs are well-aerated.  Patchy bibasilar airspace opacification, worse on the right, concerning for multifocal pneumonia.  No definite pleural effusion or pneumothorax is seen, though the right costophrenic angle is incompletely imaged on this study.  The cardiomediastinal silhouette is normal in size.  A pacemaker is noted overlying the left chest wall, with partially imaged  leads ending overlying the right atrium and right ventricle.  No acute osseous abnormalities are seen.  IMPRESSION: Patchy bibasilar airspace opacification, worse on the right, concerning for multifocal pneumonia.   Original Report Authenticated By: Tonia Ghent, M.D.     Microbiology: No results found for this or any previous visit (from the past 240 hour(s)).   Labs: Basic Metabolic Panel:  Recent Labs Lab 08/18/12 0309 08/19/12 0620  NA 139 136  K 3.8 4.0  CL 98 96  CO2  --  31  GLUCOSE 111* 137*  BUN 23 25*  CREATININE 1.60* 1.56*  CALCIUM  --  9.1   Liver Function Tests: No results found for this basename: AST, ALT, ALKPHOS, BILITOT, PROT, ALBUMIN,  in the last 168 hours No results found for this basename: LIPASE, AMYLASE,  in the last 168 hours No results found for this basename: AMMONIA,  in the last 168 hours CBC:  Recent Labs Lab 08/18/12 0244 08/18/12 0309 08/19/12 0620  WBC 6.6  --  7.2  NEUTROABS 4.0  --   --   HGB 12.1* 12.6* 11.6*  HCT 35.3* 37.0* 34.1*  MCV 91.5  --  91.7  PLT 222  --  219   Cardiac Enzymes:  Recent Labs Lab 08/18/12 0414 08/18/12 1006  TROPONINI <0.30 <0.30   BNP: BNP (last 3 results)  Recent Labs  06/04/12 2150 07/06/12 0729 08/18/12 0244  PROBNP 1507.0* 2295.0* 1651.0*   CBG:  Recent Labs Lab 08/18/12 1631 08/18/12 2154 08/18/12 2212 08/19/12 0550 08/19/12 0754  GLUCAP 131* 54* 77 158* 103*       Signed:  Alvaro Aungst C  [facility] 08/19/2012, 10:35 AM

## 2012-08-20 LAB — LEGIONELLA ANTIGEN, URINE: Legionella Antigen, Urine: NEGATIVE

## 2012-08-21 ENCOUNTER — Emergency Department (HOSPITAL_COMMUNITY): Payer: Medicare Other

## 2012-08-21 ENCOUNTER — Encounter (HOSPITAL_COMMUNITY): Payer: Self-pay

## 2012-08-21 ENCOUNTER — Inpatient Hospital Stay (HOSPITAL_COMMUNITY)
Admission: EM | Admit: 2012-08-21 | Discharge: 2012-08-24 | DRG: 293 | Disposition: A | Discharge status: Home / Self Care | Payer: Medicare Other | Attending: Internal Medicine | Admitting: Internal Medicine

## 2012-08-21 DIAGNOSIS — Z96659 Presence of unspecified artificial knee joint: Secondary | ICD-10-CM

## 2012-08-21 DIAGNOSIS — I509 Heart failure, unspecified: Secondary | ICD-10-CM | POA: Diagnosis present

## 2012-08-21 DIAGNOSIS — Z7901 Long term (current) use of anticoagulants: Secondary | ICD-10-CM

## 2012-08-21 DIAGNOSIS — I495 Sick sinus syndrome: Secondary | ICD-10-CM | POA: Diagnosis present

## 2012-08-21 DIAGNOSIS — I10 Essential (primary) hypertension: Secondary | ICD-10-CM

## 2012-08-21 DIAGNOSIS — I440 Atrioventricular block, first degree: Secondary | ICD-10-CM | POA: Diagnosis present

## 2012-08-21 DIAGNOSIS — I480 Paroxysmal atrial fibrillation: Secondary | ICD-10-CM

## 2012-08-21 DIAGNOSIS — Z794 Long term (current) use of insulin: Secondary | ICD-10-CM

## 2012-08-21 DIAGNOSIS — Z79899 Other long term (current) drug therapy: Secondary | ICD-10-CM

## 2012-08-21 DIAGNOSIS — I5032 Chronic diastolic (congestive) heart failure: Secondary | ICD-10-CM

## 2012-08-21 DIAGNOSIS — N183 Chronic kidney disease, stage 3 unspecified: Secondary | ICD-10-CM | POA: Diagnosis present

## 2012-08-21 DIAGNOSIS — I2510 Atherosclerotic heart disease of native coronary artery without angina pectoris: Secondary | ICD-10-CM | POA: Diagnosis present

## 2012-08-21 DIAGNOSIS — N289 Disorder of kidney and ureter, unspecified: Secondary | ICD-10-CM | POA: Diagnosis present

## 2012-08-21 DIAGNOSIS — I129 Hypertensive chronic kidney disease with stage 1 through stage 4 chronic kidney disease, or unspecified chronic kidney disease: Secondary | ICD-10-CM | POA: Diagnosis present

## 2012-08-21 DIAGNOSIS — R0602 Shortness of breath: Secondary | ICD-10-CM

## 2012-08-21 DIAGNOSIS — J9600 Acute respiratory failure, unspecified whether with hypoxia or hypercapnia: Secondary | ICD-10-CM

## 2012-08-21 DIAGNOSIS — Z8249 Family history of ischemic heart disease and other diseases of the circulatory system: Secondary | ICD-10-CM

## 2012-08-21 DIAGNOSIS — E876 Hypokalemia: Secondary | ICD-10-CM | POA: Diagnosis present

## 2012-08-21 DIAGNOSIS — E785 Hyperlipidemia, unspecified: Secondary | ICD-10-CM | POA: Diagnosis present

## 2012-08-21 DIAGNOSIS — I70209 Unspecified atherosclerosis of native arteries of extremities, unspecified extremity: Secondary | ICD-10-CM | POA: Diagnosis present

## 2012-08-21 DIAGNOSIS — R079 Chest pain, unspecified: Secondary | ICD-10-CM

## 2012-08-21 DIAGNOSIS — N179 Acute kidney failure, unspecified: Secondary | ICD-10-CM

## 2012-08-21 DIAGNOSIS — E119 Type 2 diabetes mellitus without complications: Secondary | ICD-10-CM | POA: Diagnosis present

## 2012-08-21 DIAGNOSIS — Z888 Allergy status to other drugs, medicaments and biological substances status: Secondary | ICD-10-CM

## 2012-08-21 DIAGNOSIS — R001 Bradycardia, unspecified: Secondary | ICD-10-CM

## 2012-08-21 DIAGNOSIS — E1142 Type 2 diabetes mellitus with diabetic polyneuropathy: Secondary | ICD-10-CM | POA: Diagnosis present

## 2012-08-21 DIAGNOSIS — I5033 Acute on chronic diastolic (congestive) heart failure: Principal | ICD-10-CM | POA: Diagnosis present

## 2012-08-21 DIAGNOSIS — J189 Pneumonia, unspecified organism: Secondary | ICD-10-CM

## 2012-08-21 DIAGNOSIS — I498 Other specified cardiac arrhythmias: Secondary | ICD-10-CM

## 2012-08-21 DIAGNOSIS — Z950 Presence of cardiac pacemaker: Secondary | ICD-10-CM

## 2012-08-21 DIAGNOSIS — Z9849 Cataract extraction status, unspecified eye: Secondary | ICD-10-CM

## 2012-08-21 DIAGNOSIS — I119 Hypertensive heart disease without heart failure: Secondary | ICD-10-CM | POA: Diagnosis present

## 2012-08-21 DIAGNOSIS — I4891 Unspecified atrial fibrillation: Secondary | ICD-10-CM | POA: Diagnosis present

## 2012-08-21 LAB — CBC WITH DIFFERENTIAL/PLATELET
Basophils Absolute: 0 K/uL (ref 0.0–0.1)
Basophils Relative: 0 % (ref 0–1)
Eosinophils Absolute: 0.3 K/uL (ref 0.0–0.7)
Eosinophils Relative: 3 % (ref 0–5)
HCT: 33 % — ABNORMAL LOW (ref 39.0–52.0)
Hemoglobin: 11.4 g/dL — ABNORMAL LOW (ref 13.0–17.0)
Lymphocytes Relative: 15 % (ref 12–46)
Lymphs Abs: 1.2 K/uL (ref 0.7–4.0)
MCH: 31.5 pg (ref 26.0–34.0)
MCHC: 34.5 g/dL (ref 30.0–36.0)
MCV: 91.2 fL (ref 78.0–100.0)
Monocytes Absolute: 0.6 K/uL (ref 0.1–1.0)
Monocytes Relative: 7 % (ref 3–12)
Neutro Abs: 6.1 K/uL (ref 1.7–7.7)
Neutrophils Relative %: 74 % (ref 43–77)
Platelets: 197 K/uL (ref 150–400)
RBC: 3.62 MIL/uL — ABNORMAL LOW (ref 4.22–5.81)
RDW: 13.7 % (ref 11.5–15.5)
WBC: 8.2 K/uL (ref 4.0–10.5)

## 2012-08-21 LAB — PROTIME-INR
INR: 1.01 (ref 0.00–1.49)
Prothrombin Time: 13.2 s (ref 11.6–15.2)

## 2012-08-21 LAB — TROPONIN I
Troponin I: <0.3 ng/mL (ref <0.30)
Troponin I: <0.3 ng/mL (ref <0.30)

## 2012-08-21 LAB — POCT I-STAT, CHEM 8
BUN: 29 mg/dL — ABNORMAL HIGH (ref 6–23)
Calcium, Ion: 1.13 mmol/L (ref 1.13–1.30)
Chloride: 99 mEq/L (ref 96–112)
Creatinine, Ser: 1.6 mg/dL — ABNORMAL HIGH (ref 0.50–1.35)
Glucose, Bld: 228 mg/dL — ABNORMAL HIGH (ref 70–99)
Potassium: 4 mEq/L (ref 3.5–5.1)

## 2012-08-21 LAB — PRO B NATRIURETIC PEPTIDE: Pro B Natriuretic peptide (BNP): 2437 pg/mL — ABNORMAL HIGH (ref 0–450)

## 2012-08-21 LAB — CBC
HCT: 34.3 % — ABNORMAL LOW (ref 39.0–52.0)
Platelets: 194 10*3/uL (ref 150–400)
RDW: 13.8 % (ref 11.5–15.5)
WBC: 8.2 10*3/uL (ref 4.0–10.5)

## 2012-08-21 LAB — CREATININE, SERUM
Creatinine, Ser: 1.51 mg/dL — ABNORMAL HIGH (ref 0.50–1.35)
GFR calc Af Amer: 44 mL/min — ABNORMAL LOW (ref >90)
GFR calc non Af Amer: 38 mL/min — ABNORMAL LOW (ref >90)

## 2012-08-21 LAB — GLUCOSE, CAPILLARY: Glucose-Capillary: 213 mg/dL — ABNORMAL HIGH (ref 70–99)

## 2012-08-21 LAB — POCT I-STAT TROPONIN I

## 2012-08-21 MED ORDER — NITROGLYCERIN 0.4 MG SL SUBL: 0.4000 mg | SUBLINGUAL_TABLET | SUBLINGUAL | Status: DC | PRN

## 2012-08-21 MED ORDER — ISOSORBIDE MONONITRATE ER 30 MG PO TB24
30.0000 mg | ORAL_TABLET | Freq: Every day | ORAL | Status: DC
  Administered 2012-08-21: 30 mg via ORAL
  Filled 2012-08-21: qty 1

## 2012-08-21 MED ORDER — ALBUTEROL SULFATE (5 MG/ML) 0.5% IN NEBU: 2.5000 mg | INHALATION_SOLUTION | RESPIRATORY_TRACT | Status: DC | PRN

## 2012-08-21 MED ORDER — PIPERACILLIN-TAZOBACTAM 3.375 G IVPB
3.3750 g | Freq: Once | INTRAVENOUS | Status: DC
  Administered 2012-08-21: 3.375 g via INTRAVENOUS
  Filled 2012-08-21: qty 50

## 2012-08-21 MED ORDER — IRBESARTAN 150 MG PO TABS
150.0000 mg | ORAL_TABLET | Freq: Every day | ORAL | Status: DC
  Administered 2012-08-21: 150 mg via ORAL
  Filled 2012-08-21: qty 1

## 2012-08-21 MED ORDER — SODIUM CHLORIDE 0.9 % IJ SOLN
3.0000 mL | Freq: Two times a day (BID) | INTRAMUSCULAR | Status: DC
  Administered 2012-08-21 – 2012-08-23 (×6): 3 mL via INTRAVENOUS

## 2012-08-21 MED ORDER — TAFLUPROST 0.0015 % OP SOLN: 1.0000 [drp] | OPHTHALMIC | Status: DC

## 2012-08-21 MED ORDER — METOLAZONE 2.5 MG PO TABS
2.5000 mg | ORAL_TABLET | Freq: Once | ORAL | Status: AC
  Administered 2012-08-21: 2.5 mg via ORAL
  Filled 2012-08-21: qty 1

## 2012-08-21 MED ORDER — FERROUS SULFATE 325 (65 FE) MG PO TABS
325.0000 mg | ORAL_TABLET | Freq: Every day | ORAL | Status: DC
  Administered 2012-08-22 – 2012-08-24 (×3): 325 mg via ORAL
  Filled 2012-08-21 (×4): qty 1

## 2012-08-21 MED ORDER — DUTASTERIDE 0.5 MG PO CAPS
0.5000 mg | ORAL_CAPSULE | Freq: Every day | ORAL | Status: DC
  Administered 2012-08-21 – 2012-08-24 (×4): 0.5 mg via ORAL
  Filled 2012-08-21 (×4): qty 1

## 2012-08-21 MED ORDER — ENOXAPARIN SODIUM 40 MG/0.4ML ~~LOC~~ SOLN
40.0000 mg | SUBCUTANEOUS | Status: DC
  Administered 2012-08-21: 40 mg via SUBCUTANEOUS
  Filled 2012-08-21 (×2): qty 0.4

## 2012-08-21 MED ORDER — ISOSORBIDE MONONITRATE ER 60 MG PO TB24
60.0000 mg | ORAL_TABLET | Freq: Every day | ORAL | Status: DC
  Administered 2012-08-22 – 2012-08-24 (×3): 60 mg via ORAL
  Filled 2012-08-21 (×3): qty 1

## 2012-08-21 MED ORDER — ADULT MULTIVITAMIN W/MINERALS CH
1.0000 | ORAL_TABLET | Freq: Every day | ORAL | Status: DC
  Administered 2012-08-21 – 2012-08-24 (×4): 1 via ORAL
  Filled 2012-08-21 (×4): qty 1

## 2012-08-21 MED ORDER — INSULIN GLARGINE 100 UNIT/ML ~~LOC~~ SOLN
13.0000 [IU] | Freq: Every day | SUBCUTANEOUS | Status: DC
  Administered 2012-08-21 – 2012-08-23 (×3): 13 [IU] via SUBCUTANEOUS
  Filled 2012-08-21 (×6): qty 0.13

## 2012-08-21 MED ORDER — LEVOFLOXACIN IN D5W 750 MG/150ML IV SOLN
750.0000 mg | INTRAVENOUS | Status: DC
  Administered 2012-08-21: 750 mg via INTRAVENOUS
  Filled 2012-08-21: qty 150

## 2012-08-21 MED ORDER — FUROSEMIDE 10 MG/ML IJ SOLN
80.0000 mg | Freq: Three times a day (TID) | INTRAMUSCULAR | Status: DC
  Administered 2012-08-21 – 2012-08-22 (×2): 80 mg via INTRAVENOUS
  Filled 2012-08-21 (×5): qty 8

## 2012-08-21 MED ORDER — VANCOMYCIN HCL IN DEXTROSE 1-5 GM/200ML-% IV SOLN
1000.0000 mg | Freq: Once | INTRAVENOUS | Status: AC
  Administered 2012-08-21: 1000 mg via INTRAVENOUS
  Filled 2012-08-21: qty 200

## 2012-08-21 MED ORDER — SODIUM CHLORIDE 0.9 % IJ SOLN: 3.0000 mL | INTRAMUSCULAR | Status: DC | PRN

## 2012-08-21 MED ORDER — POTASSIUM CHLORIDE CRYS ER 20 MEQ PO TBCR
20.0000 meq | EXTENDED_RELEASE_TABLET | Freq: Every day | ORAL | Status: DC
  Administered 2012-08-21 – 2012-08-24 (×4): 20 meq via ORAL
  Filled 2012-08-21 (×5): qty 1

## 2012-08-21 MED ORDER — ONDANSETRON HCL 4 MG/2ML IJ SOLN
4.0000 mg | Freq: Four times a day (QID) | INTRAMUSCULAR | Status: DC | PRN
  Administered 2012-08-22: 4 mg via INTRAVENOUS
  Filled 2012-08-21: qty 2

## 2012-08-21 MED ORDER — METOPROLOL TARTRATE 12.5 MG HALF TABLET
12.5000 mg | ORAL_TABLET | Freq: Two times a day (BID) | ORAL | Status: DC
  Administered 2012-08-21 – 2012-08-22 (×3): 12.5 mg via ORAL
  Filled 2012-08-21 (×4): qty 1

## 2012-08-21 MED ORDER — FUROSEMIDE 10 MG/ML IJ SOLN: 60.0000 mg | Freq: Two times a day (BID) | INTRAMUSCULAR | Status: DC

## 2012-08-21 MED ORDER — ASPIRIN 81 MG PO CHEW
162.0000 mg | CHEWABLE_TABLET | Freq: Two times a day (BID) | ORAL | Status: DC
  Administered 2012-08-21 – 2012-08-22 (×3): 162 mg via ORAL
  Filled 2012-08-21 (×2): qty 1
  Filled 2012-08-21 (×2): qty 2

## 2012-08-21 MED ORDER — POLYETHYLENE GLYCOL 3350 17 G PO PACK
17.0000 g | PACK | Freq: Every day | ORAL | Status: DC | PRN
  Filled 2012-08-21: qty 1

## 2012-08-21 MED ORDER — HYDROCODONE-ACETAMINOPHEN 7.5-325 MG PO TABS: 1.0000 | ORAL_TABLET | Freq: Four times a day (QID) | ORAL | Status: DC | PRN

## 2012-08-21 MED ORDER — SODIUM CHLORIDE 0.9 % IV SOLN: 250.0000 mL | INTRAVENOUS | Status: DC | PRN

## 2012-08-21 MED ORDER — ASPIRIN 81 MG PO TABS: 162.0000 mg | ORAL_TABLET | Freq: Two times a day (BID) | ORAL | Status: DC

## 2012-08-21 MED ORDER — ACETAMINOPHEN 325 MG PO TABS: 650.0000 mg | ORAL_TABLET | ORAL | Status: DC | PRN

## 2012-08-21 MED ORDER — BENZONATATE 100 MG PO CAPS
100.0000 mg | ORAL_CAPSULE | Freq: Three times a day (TID) | ORAL | Status: DC | PRN
  Filled 2012-08-21: qty 1

## 2012-08-21 MED ORDER — AMLODIPINE BESYLATE 10 MG PO TABS
10.0000 mg | ORAL_TABLET | Freq: Every day | ORAL | Status: DC
  Administered 2012-08-21: 10 mg via ORAL
  Filled 2012-08-21: qty 1

## 2012-08-21 MED ORDER — FUROSEMIDE 10 MG/ML IJ SOLN
40.0000 mg | Freq: Once | INTRAMUSCULAR | Status: AC
  Administered 2012-08-21: 40 mg via INTRAVENOUS
  Filled 2012-08-21: qty 4

## 2012-08-21 MED ORDER — INSULIN ASPART 100 UNIT/ML ~~LOC~~ SOLN
0.0000 [IU] | Freq: Three times a day (TID) | SUBCUTANEOUS | Status: DC
  Administered 2012-08-21: 3 [IU] via SUBCUTANEOUS
  Administered 2012-08-21: 5 [IU] via SUBCUTANEOUS
  Administered 2012-08-22 (×2): 2 [IU] via SUBCUTANEOUS
  Administered 2012-08-23: 5 [IU] via SUBCUTANEOUS
  Administered 2012-08-23: 7 [IU] via SUBCUTANEOUS
  Administered 2012-08-23 – 2012-08-24 (×2): 2 [IU] via SUBCUTANEOUS

## 2012-08-21 NOTE — Consult Note (Addendum)
Admit date: 08/21/2012 Referring Physician  [facility] Primary Physician  Audrie Lia, MD Primary Cardiologist  [facility] Leia Alf, MD Reason for Consultation  Readmit for CHF  ASSESSMENT: 1. Acute on chronic diastolic heart failure with severe LVH. R/O arrhythmia as precipitant; R/O ischemia (doubt); R/O RAS; r/o OSA 2. High grade AV block requiring PPM remote 3. DM 4. Acute renal impairment , from diuresis and intrinsic disease 5. Hypertension 6. Peripheral arterial disease, below the knee bilat. by angio 2007. Widely patent renal arteries at the time 7. Left carotid endarterectomy 2006  PLAN: 1. Aggressive diuresis 2. RA doppler to r/o RAS 3.?look for evidence MI/ischemia but at his age not a good candidate for cath/revasc. 4. Monitor nocturnal O2 stas while in hospital to r/o Sleep apnea/nocturnal hypoxia 5. Will speak with EP about ?re-synch therapy (not likely to help with normal EF). I will add beta blocker and decrease pacing rate to increase the filling time, which may help treating the Diastolic heart failure. 6. Repeat echo to verify LVEF. Was 55% in Jan 2014 (see below).   HPI:  After getting home on Tuesday, he noted that he was not getting a good response to the oral furosemide despite the fact his dose was increased by 50% per dose. He states shortness of breath develops when he goes to the bathroom for urination. He denies pain in chest but states he has tightness when he is dyspneic. He feels fine now. He denies chest discomfort. He feels everything got better after IV lasix.   PMH:   Past Medical History  Diagnosis Date  . CHF (congestive heart failure)   . Diabetes mellitus   . Hypertension   . Hyperlipidemia   . Coronary artery disease   . Spinal stenosis   . Dysrhythmia   . Pneumonia   . Pacemaker   . Arthritis   . Anemia      PSH:   Past Surgical History  Procedure Laterality Date  . Lumbar laminectomy    . Back surgery    . Total knee arthroplasty    .  Quadriceps tendon repair    . Cataract extraction    . Insert / replace / remove pacemaker    . Carotid endarterectomy      Allergies:  Statins Prior to Admit Meds:   Prescriptions prior to admission  Medication Sig Dispense Refill  . amLODipine (NORVASC) 10 MG tablet Take 10 mg by mouth daily.      [name] aspirin 81 MG tablet Take 162 mg by mouth 2 (two) times daily.       . benzonatate (TESSALON PERLES) 100 MG capsule Take 1 capsule (100 mg total) by mouth 3 (three) times daily as needed for cough.  30 capsule  0  . dutasteride (AVODART) 0.5 MG capsule Take 0.5 mg by mouth daily.      . ferrous sulfate 325 (65 FE) MG tablet Take 325 mg by mouth daily with breakfast.      . furosemide (LASIX) 80 MG tablet Take 1.5 tablets (120 mg total) by mouth 2 (two) times daily.  90 tablet  0  . HYDROcodone-acetaminophen (NORCO) 7.5-325 MG per tablet Take 1 tablet by mouth every 6 (six) hours as needed for pain.  100 tablet  5  . insulin glargine (LANTUS) 100 UNIT/ML injection Inject 13 Units into the skin at bedtime.       . insulin lispro (HUMALOG) 100 UNIT/ML injection Inject 7-9 Units into the skin 3 (three) times  daily before meals. 7 units with breakfast and lunch, 9 units after supper      . isosorbide mononitrate (IMDUR) 30 MG 24 hr tablet Take 30 mg by mouth daily.      [name] moxifloxacin (AVELOX) 400 MG tablet Take 1 tablet (400 mg total) by mouth daily.  6 tablet  0  . Multiple Vitamin (MULTIVITAMIN WITH MINERALS) TABS Take 1 tablet by mouth daily. Diabetic pak by Ashby Dawes from [employer]      . Polyethyl Glycol-Propyl Glycol (SYSTANE OP) Place 1 drop into both eyes 2 (two) times daily as needed. For dry eyes      . polyethylene glycol (MIRALAX / GLYCOLAX) packet Take 17 g by mouth daily as needed. For constipation      . potassium chloride SA (K-DUR,KLOR-CON) 20 MEQ tablet Take 20 mEq by mouth daily.      . Tafluprost (ZIOPTAN) 0.0015 % SOLN Place 1 drop into both eyes every other day. ONLY PRESERVATIVE  FREE      . valsartan (DIOVAN) 160 MG tablet Take 160 mg by mouth every morning.       . nitroGLYCERIN (NITROSTAT) 0.4 MG SL tablet Place 0.4 mg under the tongue every 5 (five) minutes as needed for chest pain.       Fam HX:    Family History  Problem Relation Age of Onset  . Multiple myeloma Father   . Hypertension Mother    Social HX:    History   Social History  . Marital Status: Widowed    Spouse Name: N/A    Number of Children: N/A  . Years of Education: N/A   Occupational History  . Not on file.   Social History Main Topics  . Smoking status: Never Smoker   . Smokeless tobacco: Never Used  . Alcohol Use: Yes     Comment: Cocktails occasionally  . Drug Use: No  . Sexually Active: Not Currently   Other Topics Concern  . Not on file   Social History Narrative  . No narrative on file     Review of Systems: No new complaints. Appetite is normal. No palpitations.  Physical Exam: Blood pressure 145/59, pulse 70, temperature 97.4 F (36.3 C), temperature source Oral, resp. rate 18, height 5\' 7"  (1.702 m), weight 90.6 kg (199 lb 11.8 oz), SpO2 100.00%. Weight change:   Moderate JVD with patient lying at 45 degrees. Chest with decreased breath sounds bilat and mis lung rales. Cardiac exam is notable for a gallop. No significant murmur. No peripheral edema Neuro is unremarkable.  Labs:   Lab Results  Component Value Date   WBC 8.2 08/21/2012   HGB 11.9* 08/21/2012   HCT 35.0* 08/21/2012   MCV 91.2 08/21/2012   PLT 197 08/21/2012    Recent Labs Lab 08/19/12 0620 08/21/12 0701  NA 136 137  K 4.0 4.0  CL 96 99  CO2 31  --   BUN 25* 29*  CREATININE 1.56* 1.60*  CALCIUM 9.1  --   GLUCOSE 137* 228*   No results found for this basename: PTT   Lab Results  Component Value Date   INR 1.01 08/21/2012   INR 0.98 08/18/2012   INR 1.00 02/29/2012   Lab Results  Component Value Date   CKTOTAL 452* 05/26/2011   CKMB 3.3 05/26/2011   TROPONINI <0.30 08/18/2012         Radiology:  Dg Chest Portable 1 View  08/21/2012  *RADIOLOGY REPORT*  Clinical Data: Shortness of  breath; ST elevation myocardial infarction.  PORTABLE CHEST - 1 VIEW  Comparison: Chest radiograph performed 08/19/2012  Findings: The lungs are well-aerated.  Bilateral airspace opacification is noted, with sparing of the lung apices.  This is more prominent on the right side.  This may reflect mildly asymmetric pulmonary edema or possibly pneumonia.  There is no evidence of pleural effusion or pneumothorax.  The cardiomediastinal silhouette is borderline normal in size.  A pacemaker is noted overlying the left chest wall, with leads ending overlying the right atrium and right ventricle.  No acute osseous abnormalities are seen.  IMPRESSION: Bilateral airspace opacification, more prominent on the right. This may reflect mildly asymmetric pulmonary edema or possibly pneumonia.   Original Report Authenticated By: Tonia Ghent, M.D.    EKG:  A pacing intermittent with LBBB  ECHO:  05/2012 at [facility] Cardiology Conclusions: 1. There is moderate concentric left ventricle hypertrophy. 2. Left ventricular ejection fraction estimated by 2D at 55-60 percent. 3. Moderate left atrial enlargement. 4. Mild mitral annular calcification. 5. Trace mitral valve regurgitation. 6. Analysis of mitral valve inflow, pulmonary vein Doppler and tissue Doppler suggests grade Ia diastolic dysfunction with elevated left atrial pressure. 7. The aortic valve is sclerotic with reduced excursion. 8. Trivial tricuspid regurgitation.  Lesleigh Noe 08/21/2012 10:12 AM

## 2012-08-21 NOTE — ED Notes (Signed)
Pt arrived by ems for STEMI, pt had onset of chest tightness at 1250 this morning and then subsided so refused treatment, then pt had a second episode along with diaphoresis, chest tightness, sob, and nausea so ems activated stemi, pt recently discharged from hospital. Pt with a bbb and pacemaker. Pt has coarse breath sounds through out from recent pna he is being treated with avelox.

## 2012-08-21 NOTE — Progress Notes (Signed)
Patient has been previously referred to [facility] services.  Patient formally declines services at this time as he feels he can self manage independently with paid staff at home.  Of note, [facility] Care Management services does not replace or interfere with any services that are arranged by inpatient case management or social work.  For additional questions or referrals please contact Anibal Henderson BSN RN [facility] Liaison at [phone].

## 2012-08-21 NOTE — ED Provider Notes (Signed)
History  This chart was scribed for Darrell Testerman Smitty Cords, MD by Shari Heritage, ED Scribe. The patient was seen in room [facility]. Patient's care was started at 0540.   CSN: [id number]  Arrival date & time 08/21/12  7829   First MD Initiated Contact with Patient 08/21/12 0540      Chief Complaint  Patient presents with  . Code STEMI    Patient is a 79 y.o. male presenting with chest pain. The history is provided by the patient. No language interpreter was used.  Chest Pain Pain location:  Substernal area Pain quality: tightness   Pain radiates to:  Does not radiate Pain radiates to the back: no   Pain severity:  Moderate Onset quality:  Sudden Duration:  30 minutes Timing:  Constant Progression:  Resolved Chronicity:  New Associated symptoms: diaphoresis, nausea and shortness of breath   Associated symptoms: no abdominal pain, no back pain, no dysphagia, no fever, no headache, no numbness, not vomiting and no weakness      HPI Comments: [name]. is a 80 y.o. male with congestive heart failure, pacemaker, BBB, diabetes, hypertension, coronary artery disease who presents to the Emergency Department via EMS under code STEMI protocol. Per EMS personnel, EMS was called to patient's home at 7 pm last night after patient reported having substernal, moderate, non-radiating chest tightness. Upon arrival of EMS, patient refused treatment. They returned to the patient's home about 30 minutes ago after he made a second call complaining of the same chest tightness with associated diaphoresis, shortness of breath and nausea. Patient was given 4 aspirin and 1 nitroglycerin en route. Patient says that chest tightness is now resolved, but he is still having shortness of breath and nausea. Patient was discharged from the hospital on Tuesday after being admitted to treat pneumonia, hypoxia and dyspnea.   Cardiologist - [facility]  Past Medical History  Diagnosis Date  . CHF  (congestive heart failure)   . Diabetes mellitus   . Hypertension   . Hyperlipidemia   . Coronary artery disease   . Spinal stenosis   . Dysrhythmia   . Pneumonia   . Pacemaker   . Arthritis   . Anemia     Past Surgical History  Procedure Laterality Date  . Lumbar laminectomy    . Back surgery    . Total knee arthroplasty    . Quadriceps tendon repair    . Cataract extraction    . Insert / replace / remove pacemaker    . Carotid endarterectomy      Family History  Problem Relation Age of Onset  . Multiple myeloma Father   . Hypertension Mother     History  Substance Use Topics  . Smoking status: Never Smoker   . Smokeless tobacco: Never Used  . Alcohol Use: Yes     Comment: Cocktails occasionally      Review of Systems  Constitutional: Positive for diaphoresis. Negative for fever and chills.  HENT: Negative for congestion, sore throat, rhinorrhea and trouble swallowing.   Eyes: Negative for visual disturbance.  Respiratory: Positive for shortness of breath.   Cardiovascular: Positive for chest pain.  Gastrointestinal: Positive for nausea. Negative for vomiting, abdominal pain and diarrhea.  Genitourinary: Negative for dysuria and frequency.  Musculoskeletal: Negative for back pain.  Skin: Negative for rash.  Neurological: Negative for syncope, weakness, numbness and headaches.  All other systems reviewed and are negative.    Allergies  Statins  Home Medications  Current Outpatient Rx  Name  Route  Sig  Dispense  Refill  . amLODipine (NORVASC) 10 MG tablet   Oral   Take 10 mg by mouth daily.         [name] aspirin 81 MG tablet   Oral   Take 162 mg by mouth 2 (two) times daily.          . benzonatate (TESSALON PERLES) 100 MG capsule   Oral   Take 1 capsule (100 mg total) by mouth 3 (three) times daily as needed for cough.   30 capsule   0   . dutasteride (AVODART) 0.5 MG capsule   Oral   Take 0.5 mg by mouth daily.         . ferrous  sulfate 325 (65 FE) MG tablet   Oral   Take 325 mg by mouth daily with breakfast.         . furosemide (LASIX) 80 MG tablet   Oral   Take 1.5 tablets (120 mg total) by mouth 2 (two) times daily.   90 tablet   0   . HYDROcodone-acetaminophen (NORCO) 7.5-325 MG per tablet   Oral   Take 1 tablet by mouth every 6 (six) hours as needed for pain.   100 tablet   5   . insulin glargine (LANTUS) 100 UNIT/ML injection   Subcutaneous   Inject 13 Units into the skin at bedtime.          . insulin lispro (HUMALOG) 100 UNIT/ML injection   Subcutaneous   Inject 7-9 Units into the skin 3 (three) times daily before meals. 7 units with breakfast and lunch, 9 units after supper         . isosorbide mononitrate (IMDUR) 30 MG 24 hr tablet   Oral   Take 30 mg by mouth daily.         [name] moxifloxacin (AVELOX) 400 MG tablet   Oral   Take 1 tablet (400 mg total) by mouth daily.   6 tablet   0   . Multiple Vitamin (MULTIVITAMIN WITH MINERALS) TABS   Oral   Take 1 tablet by mouth daily. Diabetic pak by Ashby Dawes from [employer]         . Polyethyl Glycol-Propyl Glycol (SYSTANE OP)   Both Eyes   Place 1 drop into both eyes 2 (two) times daily as needed. For dry eyes         . polyethylene glycol (MIRALAX / GLYCOLAX) packet   Oral   Take 17 g by mouth daily as needed. For constipation         . potassium chloride SA (K-DUR,KLOR-CON) 20 MEQ tablet   Oral   Take 20 mEq by mouth daily.         . Tafluprost (ZIOPTAN) 0.0015 % SOLN   Both Eyes   Place 1 drop into both eyes every other day. ONLY PRESERVATIVE FREE         . valsartan (DIOVAN) 160 MG tablet   Oral   Take 160 mg by mouth every morning.            Triage Vitals: BP 132/81  Pulse 77  Temp(Src) 97.5 F (36.4 C) (Oral)  Resp 27  SpO2 96%  Physical Exam  Constitutional: He is oriented to person, place, and time. He appears well-developed and well-nourished.  HENT:  Head: Normocephalic and atraumatic.   Mouth/Throat: Oropharynx is clear and moist and mucous membranes are normal. Mucous membranes are not  dry. No oropharyngeal exudate.  Eyes: Conjunctivae and EOM are normal. Pupils are equal, round, and reactive to light.  Neck: Normal range of motion. Neck supple. No tracheal deviation present.  Cardiovascular: Normal rate, regular rhythm and normal heart sounds.   No murmur heard. Pulmonary/Chest: Effort normal. No stridor. No respiratory distress. He has no wheezes. He has no rales.  Forced breath sounds.  Abdominal: Soft. Bowel sounds are normal. He exhibits no distension. There is no tenderness. There is no rebound and no guarding.  Musculoskeletal: Normal range of motion.  No peripheral edema.  Neurological: He is alert and oriented to person, place, and time.  Skin: He is diaphoretic.    ED Course  Procedures (including critical care time) DIAGNOSTIC STUDIES: Oxygen Saturation is 96% on room air, adequate by my interpretation.    COORDINATION OF CARE: 5:41 AM- Patient informed of current plan for treatment and evaluation and agrees with plan at this time.    Labs Reviewed  CBC WITH DIFFERENTIAL  PROTIME-INR  PRO B NATRIURETIC PEPTIDE    Dg Chest Portable 1 View  08/21/2012  *RADIOLOGY REPORT*  Clinical Data: Shortness of breath; ST elevation myocardial infarction.  PORTABLE CHEST - 1 VIEW  Comparison: Chest radiograph performed 08/19/2012  Findings: The lungs are well-aerated.  Bilateral airspace opacification is noted, with sparing of the lung apices.  This is more prominent on the right side.  This may reflect mildly asymmetric pulmonary edema or possibly pneumonia.  There is no evidence of pleural effusion or pneumothorax.  The cardiomediastinal silhouette is borderline normal in size.  A pacemaker is noted overlying the left chest wall, with leads ending overlying the right atrium and right ventricle.  No acute osseous abnormalities are seen.  IMPRESSION: Bilateral airspace  opacification, more prominent on the right. This may reflect mildly asymmetric pulmonary edema or possibly pneumonia.   Original Report Authenticated By: Tonia Ghent, M.D.      No diagnosis found.    MDM   Date: 08/21/2012  Rate: 127  Rhythm: sinus tachycardia  QRS Axis: normal  Intervals: QT prolonged  ST/T Wave abnormalities: ST depressions laterally  Conduction Disutrbances:left bundle branch block  Narrative Interpretation:   Old EKG Reviewed: changes noted    MDM Reviewed: previous chart, nursing note and vitals Reviewed previous: labs and x-ray Interpretation: labs, ECG and x-ray Total time providing critical care: 30-74 minutes. This excludes time spent performing separately reportable procedures and services. Consults: admitting MD and cardiology    Medications  furosemide (LASIX) injection 40 mg (not administered)  vancomycin (VANCOCIN) IVPB 1000 mg/200 mL premix (not administered)  piperacillin-tazobactam (ZOSYN) IVPB 3.375 g (not administered)   CRITICAL CARE Performed by: Jasmine Awe   Total critical care time: 60 minutes  Critical care time was exclusive of separately billable procedures and treating other patients.  Critical care was necessary to treat or prevent imminent or life-threatening deterioration.  Critical care was time spent personally by me on the following activities: development of treatment plan with patient and/or surrogate as well as nursing, discussions with consultants, evaluation of patient's response to treatment, examination of patient, obtaining history from patient or surrogate, ordering and performing treatments and interventions, ordering and review of laboratory studies, ordering and review of radiographic studies, pulse oximetry and re-evaluation of patient's condition.   I personally performed the services described in this documentation, which was scribed in my presence. The recorded information has been reviewed  and is accurate.     [name] K Astryd Pearcy-Rasch,  MD 08/21/12 [id number]

## 2012-08-21 NOTE — H&P (Addendum)
PATIENT DETAILS Name: [name]. Age: 79 y.o. Sex: male Date of Birth: 1918/05/30 Admit Date: 08/21/2012 WUJ:[id number],YNWGNFA S, MD   CHIEF COMPLAINT:  Shortness of breath  HPI: 79 year old African American male with a past medical history of diastolic heart failure, diabetes, hypertension, coronary artery disease was just recently discharged from the hospital presented today morning to the emergency room after having 2 bouts of shortness of breath early this morning. Apparently around 3:00 in the morning, patient woke up short of breath, subsequently EMS was called and patient refused to come to the emergency room, unfortunately this reoccurred a little while later. This was associated with some vague chest pain and diaphoresis. Patient then was brought to the emergency room, point-of-care troponins were elevated, chest x-ray was suggestive of decompensated acute diastolic heart failure, I was subsequently asked to admit this patient for further evaluation and treatment. Please note that the patient was just discharged a few days ago, he is dosing of diuretics was increased and he was also discharged on Avelox therapy. He denies any cough or fever to me. During my evaluation, had already been given Lasix intravenously and had put out approximately 300 cc of urine and was feeling much more better.   ALLERGIES:   Allergies  Allergen Reactions  . Statins Other (See Comments)    rhabdomyolisis    PAST MEDICAL HISTORY: Past Medical History  Diagnosis Date  . CHF (congestive heart failure)   . Diabetes mellitus   . Hypertension   . Hyperlipidemia   . Coronary artery disease   . Spinal stenosis   . Dysrhythmia   . Pneumonia   . Pacemaker   . Arthritis   . Anemia     PAST SURGICAL HISTORY: Past Surgical History  Procedure Laterality Date  . Lumbar laminectomy    . Back surgery    . Total knee arthroplasty    . Quadriceps tendon repair    . Cataract extraction    . Insert  / replace / remove pacemaker    . Carotid endarterectomy      MEDICATIONS AT HOME: Prior to Admission medications   Medication Sig Start Date End Date Taking? Authorizing Provider  amLODipine (NORVASC) 10 MG tablet Take 10 mg by mouth daily.   Yes Historical Provider, MD  aspirin 81 MG tablet Take 162 mg by mouth 2 (two) times daily.    Yes Historical Provider, MD  benzonatate (TESSALON PERLES) 100 MG capsule Take 1 capsule (100 mg total) by mouth 3 (three) times daily as needed for cough. 08/19/12  Yes Adeline Joselyn Glassman, MD  dutasteride (AVODART) 0.5 MG capsule Take 0.5 mg by mouth daily.   Yes Historical Provider, MD  ferrous sulfate 325 (65 FE) MG tablet Take 325 mg by mouth daily with breakfast.   Yes Historical Provider, MD  furosemide (LASIX) 80 MG tablet Take 1.5 tablets (120 mg total) by mouth 2 (two) times daily. 08/19/12  Yes Adeline Joselyn Glassman, MD  HYDROcodone-acetaminophen (NORCO) 7.5-325 MG per tablet Take 1 tablet by mouth every 6 (six) hours as needed for pain. 08/19/12  Yes Levert Feinstein, MD  insulin glargine (LANTUS) 100 UNIT/ML injection Inject 13 Units into the skin at bedtime.    Yes Historical Provider, MD  insulin lispro (HUMALOG) 100 UNIT/ML injection Inject 7-9 Units into the skin 3 (three) times daily before meals. 7 units with breakfast and lunch, 9 units after supper   Yes Historical Provider, MD  isosorbide mononitrate (IMDUR) 30 MG 24  hr tablet Take 30 mg by mouth daily.   Yes Historical Provider, MD  moxifloxacin (AVELOX) 400 MG tablet Take 1 tablet (400 mg total) by mouth daily. 08/19/12  Yes Adeline Joselyn Glassman, MD  Multiple Vitamin (MULTIVITAMIN WITH MINERALS) TABS Take 1 tablet by mouth daily. Diabetic pak by Ashby Dawes from [employer]   Yes Historical Provider, MD  Polyethyl Glycol-Propyl Glycol (SYSTANE OP) Place 1 drop into both eyes 2 (two) times daily as needed. For dry eyes   Yes Historical Provider, MD  polyethylene glycol (MIRALAX / GLYCOLAX) packet Take 17 g by mouth daily as  needed. For constipation   Yes Historical Provider, MD  potassium chloride SA (K-DUR,KLOR-CON) 20 MEQ tablet Take 20 mEq by mouth daily.   Yes Historical Provider, MD  Tafluprost (ZIOPTAN) 0.0015 % SOLN Place 1 drop into both eyes every other day. ONLY PRESERVATIVE FREE   Yes Historical Provider, MD  valsartan (DIOVAN) 160 MG tablet Take 160 mg by mouth every morning.    Yes Historical Provider, MD  nitroGLYCERIN (NITROSTAT) 0.4 MG SL tablet Place 0.4 mg under the tongue every 5 (five) minutes as needed for chest pain.    Historical Provider, MD    FAMILY HISTORY: Family History  Problem Relation Age of Onset  . Multiple myeloma Father   . Hypertension Mother     SOCIAL HISTORY:  reports that he has never smoked. He has never used smokeless tobacco. He reports that  drinks alcohol. He reports that he does not use illicit drugs.  REVIEW OF SYSTEMS:  Constitutional:   No  weight loss, night sweats,  Fevers, chills, fatigue.  HEENT:    No headaches, Difficulty swallowing,Tooth/dental problems,Sore throat,  No sneezing, itching, ear ache, nasal congestion, post nasal drip,   Cardio-vascular: No chest pain,  Orthopnea, PND, swelling in lower extremities, anasarca,         dizziness, palpitations  GI:  No heartburn, indigestion, abdominal pain, nausea, vomiting, diarrhea, change in       bowel habits, loss of appetite  Resp: No shortness of breath with exertion or at rest.  No excess mucus, no productive cough, No non-productive cough,  No coughing up of blood.No change in color of mucus.No wheezing.No chest wall deformity  Skin:  no rash or lesions.  GU:  no dysuria, change in color of urine, no urgency or frequency.  No flank pain.  Musculoskeletal: No joint pain or swelling.  No decreased range of motion.  No back pain.  Psych: No change in mood or affect. No depression or anxiety.  No memory loss.   PHYSICAL EXAM: Blood pressure 119/70, pulse 76, temperature 98.1 F  (36.7 C), temperature source Oral, resp. rate 11, SpO2 96.00%.  General appearance :Awake, alert, not in any distress. Speech Clear. Not toxic Looking HEENT: Atraumatic and Normocephalic, pupils equally reactive to light and accomodation Neck: supple, no JVD. No cervical lymphadenopathy.  Chest:Good air entry bilaterally, by basilar rales CVS: S1 S2 regular, no murmurs.  Abdomen: Bowel sounds present, Non tender and not distended with no gaurding, rigidity or rebound. Extremities: B/L Lower Ext shows trace edema, both legs are warm to touch Neurology: Awake alert, and oriented X 3, CN II-XII intact, Non focal Skin:No Rash Wounds:N/A  LABS ON ADMISSION:   Recent Labs  08/19/12 0620 08/21/12 0701  NA 136 137  K 4.0 4.0  CL 96 99  CO2 31  --   GLUCOSE 137* 228*  BUN 25* 29*  CREATININE 1.56* 1.60*  CALCIUM 9.1  --    No results found for this basename: AST, ALT, ALKPHOS, BILITOT, PROT, ALBUMIN,  in the last 72 hours No results found for this basename: LIPASE, AMYLASE,  in the last 72 hours  Recent Labs  08/19/12 0620 08/21/12 0649 08/21/12 0701  WBC 7.2 8.2  --   NEUTROABS  --  6.1  --   HGB 11.6* 11.4* 11.9*  HCT 34.1* 33.0* 35.0*  MCV 91.7 91.2  --   PLT 219 197  --     Recent Labs  08/18/12 1006  TROPONINI <0.30   No results found for this basename: DDIMER,  in the last 72 hours No components found with this basename: POCBNP,    RADIOLOGIC STUDIES ON ADMISSION: Dg Chest Portable 1 View  08/21/2012  *RADIOLOGY REPORT*  Clinical Data: Shortness of breath; ST elevation myocardial infarction.  PORTABLE CHEST - 1 VIEW  Comparison: Chest radiograph performed 08/19/2012  Findings: The lungs are well-aerated.  Bilateral airspace opacification is noted, with sparing of the lung apices.  This is more prominent on the right side.  This may reflect mildly asymmetric pulmonary edema or possibly pneumonia.  There is no evidence of pleural effusion or pneumothorax.  The  cardiomediastinal silhouette is borderline normal in size.  A pacemaker is noted overlying the left chest wall, with leads ending overlying the right atrium and right ventricle.  No acute osseous abnormalities are seen.  IMPRESSION: Bilateral airspace opacification, more prominent on the right. This may reflect mildly asymmetric pulmonary edema or possibly pneumonia.   Original Report Authenticated By: Tonia Ghent, M.D.     ASSESSMENT AND PLAN: Present on Admission:  . Acute CHF (congestive heart failure) - Patient's symptoms and signs consistent with acute decompensation of underlying chronic diastolic heart failure. Apparently  recent echocardiogram done at [facility] cardiology showed an EF of 55-60%.  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 We will need to admit this patient to telemetry to make sure he is not having arrhythmias that can cause decompensation, will cycle cardiac enzymes to make sure ischemia is not a precipitating factor either-unfortunately I don't think he will be a candidate for revascularization given his advanced age. We'll also consult his primary cardiology group for further input as well.  - Was started on intravenous Lasix 60 mg twice a day, check daily weights, strict I&O's and monitor clinical course.   . CKD (chronic kidney disease), stage III - Seems to be at baseline, with ongoing diuretic therapy creatinine will be closely monitored.   . Diabetes mellitus - Place on SSI, continue with 13 units of Lantus.   . Hypertension - Will continue Imdur, ACE inhibitor- will monitor BP trend closely and adjust medications as necessary.  Further plan will depend as patient's clinical course evolves and further radiologic and laboratory data become available. Patient will be  monitored closely.  DVT Prophylaxis: - Lovenox at prophylactic dose  Code Status: - Full code  Total time spent for admission equals 45 minutes.  [facility] Pager [phone]  If 7PM-7AM, please contact night-coverage [email or web address] Password [email or web address] 08/21/2012, 8:22 AM

## 2012-08-21 NOTE — ED Notes (Signed)
Family at bedside. 

## 2012-08-21 NOTE — Progress Notes (Signed)
Utilization Review Completed Murl Zogg J. Derenda Giddings, RN, BSN, NCM [phone]  

## 2012-08-21 NOTE — Consult Note (Signed)
ELECTROPHYSIOLOGY CONSULT NOTE    Patient ID: [name]. MRN: [id number], DOB/AGE: 15-Feb-1919 79 y.o.  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  He originally received a pacemaker about 20 years ago for symptomatic bradycardia.  He underwent generator change by Dr [name] in 2004 and then again by Dr [name] in 2011.  His current device is a STJ 2210 RF pacemaker.  He has had problems over the last 18 months with increasing shortness of breath on exertion.  His Lasix dose has been increased but has not helped.  Other past medical history is significant for diabetes, acute renal impairment (diuresis and intrinsic disease), hypertension, and PAD.    He denies chest pain, palpitations, dizziness, or syncope.    He presented to the ER for evaluation and was given IV Lasix.  He feels as if his symptoms improved after that.    EP has been asked to evaluate for treatment options related to device programming.   ELECTROPHYSIOLOGY DEVICE INTERROGATION    Device Manufacturer: SJM Model Number: 2210 DOI: 11-09-2009 Implanting physician: [name] Device following physician: Katrinka Blazing  Battery Voltage: 2.95   Estimated Longevity: 4.7 years     RA Lead (Oscor) RV Lead (1226)   Amplitude 1.2 >12  Impedence 310 540  Threshold 1.25V@0 .8 1.0V@0 .6   Episodes:  High Atrial rates: 46 mode switch episodes representing 4.6% of total time.  Most episodes have atrial rates between 180-220 bpm.  There were 3 episodes with atrial rates >300bpm.  Ventricular rates during mode switches are relatively well controlled.  Most episodes (42) were less than 15 minutes.  Longest episode was 05-24-2012 (2 days 21 hours with a  Peak A  rate of 640bpm).     High Ventricular rates: 13 VHR episodes, all are likely during atrial arrhytmias with 1:1 conduction  Programmed parameters:  Huston Foley parameters:  Mode: DDDR Lower Rate: 70 Upper rate: 110  PAV: 250   SAV: 225  Presenting Rhythm: AP/VS Intrinsic Rhythm: AS/VS  Histograms:  Flat, but consistent with sensor indicated rate  Patient also with PMT noted after atrial threshold testing.  VA time .    Past Medical History  Diagnosis Date  . CHF (congestive heart failure)   . Diabetes mellitus   . Hypertension   . Hyperlipidemia   . Coronary artery disease   . Spinal stenosis   . Dysrhythmia   . Pneumonia   . Pacemaker   . Arthritis   . Anemia      Surgical History:  Past Surgical History  Procedure Laterality Date  . Lumbar laminectomy    . Back surgery    . Total knee arthroplasty    . Quadriceps tendon repair    . Cataract extraction    . Insert / replace / remove pacemaker    . Carotid endarterectomy       Prescriptions prior to admission  Medication Sig Dispense Refill  . amLODipine (NORVASC) 10 MG tablet Take 10 mg by mouth daily.      [name] aspirin 81 MG tablet Take 162 mg by mouth 2 (two) times daily.       . benzonatate (TESSALON PERLES) 100 MG capsule Take 1 capsule (100 mg total) by mouth  3 (three) times daily as needed for cough.  30 capsule  0  . dutasteride (AVODART) 0.5 MG capsule Take 0.5 mg by mouth daily.      . ferrous sulfate 325 (65 FE) MG tablet Take 325 mg by mouth daily with breakfast.      . furosemide (LASIX) 80 MG tablet Take 1.5 tablets (120 mg total) by mouth 2 (two) times daily.  90 tablet  0  . HYDROcodone-acetaminophen (NORCO) 7.5-325 MG per tablet Take 1 tablet by mouth every 6 (six) hours as needed for pain.  100 tablet  5  . insulin glargine (LANTUS) 100 UNIT/ML injection Inject 13 Units into the skin at bedtime.       . insulin lispro (HUMALOG) 100 UNIT/ML injection Inject 7-9 Units into the skin 3 (three) times  daily before meals. 7 units with breakfast and lunch, 9 units after supper      . isosorbide mononitrate (IMDUR) 30 MG 24 hr tablet Take 30 mg by mouth daily.      [name] moxifloxacin (AVELOX) 400 MG tablet Take 1 tablet (400 mg total) by mouth daily.  6 tablet  0  . Multiple Vitamin (MULTIVITAMIN WITH MINERALS) TABS Take 1 tablet by mouth daily. Diabetic pak by Ashby Dawes from [employer]      . Polyethyl Glycol-Propyl Glycol (SYSTANE OP) Place 1 drop into both eyes 2 (two) times daily as needed. For dry eyes      . polyethylene glycol (MIRALAX / GLYCOLAX) packet Take 17 g by mouth daily as needed. For constipation      . potassium chloride SA (K-DUR,KLOR-CON) 20 MEQ tablet Take 20 mEq by mouth daily.      . Tafluprost (ZIOPTAN) 0.0015 % SOLN Place 1 drop into both eyes every other day. ONLY PRESERVATIVE FREE      . valsartan (DIOVAN) 160 MG tablet Take 160 mg by mouth every morning.       . nitroGLYCERIN (NITROSTAT) 0.4 MG SL tablet Place 0.4 mg under the tongue every 5 (five) minutes as needed for chest pain.        Inpatient Medications:  . aspirin  162 mg Oral BID  . dutasteride  0.5 mg Oral Daily  . enoxaparin (LOVENOX) injection  40 mg Subcutaneous Q24H  . [START ON 08/22/2012] ferrous sulfate  325 mg Oral Q breakfast  . furosemide  80 mg Intravenous Q8H  . insulin aspart  0-9 Units Subcutaneous TID WC  . insulin glargine  13 Units Subcutaneous QHS  . [START ON 08/22/2012] isosorbide mononitrate  60 mg Oral Daily  . levofloxacin (LEVAQUIN) IV  750 mg Intravenous Q48H  . metoprolol tartrate  12.5 mg Oral BID  . multivitamin with minerals  1 tablet Oral Daily  . potassium chloride SA  20 mEq Oral Daily  . sodium chloride  3 mL Intravenous Q12H  . [START ON 08/22/2012] Tafluprost  1 drop Both Eyes QODAY    Allergies:  Allergies  Allergen Reactions  . Statins Other (See Comments)    rhabdomyolisis    History   Social History  . Marital Status: Widowed    Spouse Name: N/A    Number of  Children: N/A  . Years of Education: N/A   Occupational History  . Not on file.   Social History Main Topics  . Smoking status: Never Smoker   . Smokeless tobacco: Never Used  . Alcohol Use: Yes     Comment: Cocktails occasionally  . Drug Use: No  .  Sexually Active: Not Currently   Other Topics Concern  . Not on file   Social History Narrative  . No narrative on file     Family History  Problem Relation Age of Onset  . Multiple myeloma Father   . Hypertension Mother       Labs:   Lab Results  Component Value Date   WBC 8.2 08/21/2012   HGB 11.7* 08/21/2012   HCT 34.3* 08/21/2012   MCV 91.5 08/21/2012   PLT 194 08/21/2012    Recent Labs Lab 08/19/12 0620 08/21/12 0701 08/21/12 1040  NA 136 137  --   K 4.0 4.0  --   CL 96 99  --   CO2 31  --   --   BUN 25* 29*  --   CREATININE 1.56* 1.60* 1.51*  CALCIUM 9.1  --   --   GLUCOSE 137* 228*  --    Lab Results  Component Value Date   CKTOTAL 452* 05/26/2011   CKMB 3.3 05/26/2011   TROPONINI <0.30 08/21/2012    Lab Results  Component Value Date   DDIMER 1.47* 08/18/2012     Radiology/Studies: Nm Pulmonary Perf And Vent 08/18/2012  *RADIOLOGY REPORT*  Clinical Data:  Shortness of breath.  Elevated D-dimer levels. Question pulmonary embolism.  NUCLEAR MEDICINE VENTILATION - PERFUSION LUNG SCAN  Technique:  Ventilation images were obtained in multiple projections using inhaled aerosol technetium 99 M DTPA.  Perfusion images were obtained in multiple projections after intravenous injection of Tc-74m MAA.  Radiopharmaceuticals:  Tc-73m DTPA aerosol and 6.0 mCi Tc-84m MAA.  Comparison: Chest radiographs 08/18/2012 and 07/06/2012.  Findings:  Ventilation:  Ventilation images were acquired in only the anterior and posterior projections. Although limited, no focal ventilation abnormality is identified.  Perfusion:   No wedge shaped peripheral perfusion defects to suggest acute pulmonary embolism.  There is no evidence of  ventilation / perfusion mismatch.  IMPRESSION: Very low probability for acute pulmonary embolism.   Original Report Authenticated By: Carey Bullocks, M.D.    Dg Chest Portable 1 View 08/21/2012  *RADIOLOGY REPORT*  Clinical Data: Shortness of breath; ST elevation myocardial infarction.  PORTABLE CHEST - 1 VIEW  Comparison: Chest radiograph performed 08/19/2012  Findings: The lungs are well-aerated.  Bilateral airspace opacification is noted, with sparing of the lung apices.  This is more prominent on the right side.  This may reflect mildly asymmetric pulmonary edema or possibly pneumonia.  There is no evidence of pleural effusion or pneumothorax.  The cardiomediastinal silhouette is borderline normal in size.  A pacemaker is noted overlying the left chest wall, with leads ending overlying the right atrium and right ventricle.  No acute osseous abnormalities are seen.  IMPRESSION: Bilateral airspace opacification, more prominent on the right. This may reflect mildly asymmetric pulmonary edema or possibly pneumonia.   Original Report Authenticated By: Tonia Ghent, M.D.    ZOX:WRUEAV pacing with intrinsic ventricular conduction ( ), LBBB  TELEMETRY: atrial pacing with intrinsic ventricular conduction, periodic episodes of PMT  DEVICE HISTORY:   -Dual chamber pacemaker implanted in 1993 by general surgeons  -PPM gen change in 2004 by Dr [name] (previously implanted leads used)  -PPM gen change in 2011 by Dr [name] [facility]) previously implanted leads used.   Physical Exam: Filed Vitals:   08/21/12 0815 08/21/12 0854 08/21/12 0910 08/21/12 1453  BP: 119/65 145/59  119/53  Pulse: 77 70  75  Temp:  97.4 F (36.3 C)  97.7 F (36.5 C)  TempSrc:  Oral  Oral  Resp: 16 18  20   Height:  5\' 7"  (1.702 m)    Weight:  199 lb 11.8 oz (90.6 kg) 199 lb 11.8 oz (90.6 kg)   SpO2: 92% 96% 100% 99%    GEN- The patient is elderly but well appearing, alert and oriented x 3 today.   Head- normocephalic,  atraumatic Eyes-  Sclera clear, conjunctiva pink Ears- hearing intact Oropharynx- clear Neck- supple,  Lungs- Clear to ausculation bilaterally, normal work of breathing Heart- Regular rate and rhythm  GI- soft, NT, ND, + BS Extremities- no clubbing, cyanosis, or edema MS- agre appropriate atrophy Skin- no rash or lesion Psych- euthymic mood, full affect Neuro- strength and sensation are intact  Assessment and Plan:  1. Sick sinus syndrome with first degree AV block Normal pacemaker function Episodic PMT noted I have decreased lower pacing rate to 60 bpm and shorted AV (from ) to as this may improve symptoms of CHF. Agree with beta blocker increase as per Dr Katrinka Blazing  2. Afib He has had afib, though last episode was in February.  Longest episode 48 hours. Given increased stroke risks, it is reasonable to consider anticoagulation long term. I would recommend eliquis 2.5mg  BID and stop ASA going forward. I will defer this decision ultimately to the patient and Dr Katrinka Blazing.  I will see as needed while here. Please call with questions.  Hillis Range, MD

## 2012-08-22 DIAGNOSIS — I480 Paroxysmal atrial fibrillation: Secondary | ICD-10-CM | POA: Diagnosis not present

## 2012-08-22 DIAGNOSIS — J811 Chronic pulmonary edema: Secondary | ICD-10-CM

## 2012-08-22 LAB — BASIC METABOLIC PANEL
BUN: 25 mg/dL — ABNORMAL HIGH (ref 6–23)
Calcium: 9.5 mg/dL (ref 8.4–10.5)
Creatinine, Ser: 1.61 mg/dL — ABNORMAL HIGH (ref 0.50–1.35)
GFR calc non Af Amer: 35 mL/min — ABNORMAL LOW (ref >90)
Glucose, Bld: 126 mg/dL — ABNORMAL HIGH (ref 70–99)

## 2012-08-22 LAB — GLUCOSE, CAPILLARY
Glucose-Capillary: 123 mg/dL — ABNORMAL HIGH (ref 70–99)
Glucose-Capillary: 183 mg/dL — ABNORMAL HIGH (ref 70–99)
Glucose-Capillary: 160 mg/dL — ABNORMAL HIGH (ref 70–99)

## 2012-08-22 MED ORDER — METOPROLOL TARTRATE 25 MG PO TABS
25.0000 mg | ORAL_TABLET | Freq: Two times a day (BID) | ORAL | Status: DC
  Administered 2012-08-22 – 2012-08-24 (×4): 25 mg via ORAL
  Filled 2012-08-22 (×5): qty 1

## 2012-08-22 MED ORDER — APIXABAN 2.5 MG PO TABS
2.5000 mg | ORAL_TABLET | Freq: Two times a day (BID) | ORAL | Status: DC
  Administered 2012-08-22 – 2012-08-24 (×5): 2.5 mg via ORAL
  Filled 2012-08-22 (×6): qty 1

## 2012-08-22 MED ORDER — FUROSEMIDE 80 MG PO TABS
120.0000 mg | ORAL_TABLET | Freq: Two times a day (BID) | ORAL | Status: DC
  Administered 2012-08-22: 120 mg via ORAL
  Filled 2012-08-22 (×2): qty 1

## 2012-08-22 MED ORDER — TORSEMIDE 20 MG PO TABS
60.0000 mg | ORAL_TABLET | Freq: Two times a day (BID) | ORAL | Status: DC
  Administered 2012-08-23 – 2012-08-24 (×3): 60 mg via ORAL
  Filled 2012-08-22 (×4): qty 3

## 2012-08-22 NOTE — Progress Notes (Addendum)
[facility]             Progress Note   Subjective: Feels less SOB today. Denies CP.  Objective: Vital signs in last 24 hours: Temp:  [97.5 F (36.4 C)-98.4 F (36.9 C)] 98.4 F (36.9 C) (04/11 1352) Pulse Rate:  [50-61] 57 (04/11 1352) Resp:  [20] 20 (04/11 1352) BP: (113-130)/(52-64) 113/52 mmHg (04/11 1352) SpO2:  [93 %-97 %] 94 % (04/11 1352) Weight:  [88.95 kg (196 lb 1.6 oz)] 88.95 kg (196 lb 1.6 oz) (04/11 0646) Weight change:  Last BM Date: 08/21/12  Intake/Output from previous day: 04/10 0701 - 04/11 0700 In: 720 [P.O.:720] Out: 3300 [Urine:3300] Total I/O In: 243 [P.O.:240; I.V.:3] Out: 1800 [Urine:1800]   Physical Exam: General: Alert, awake, oriented x3, in no acute distress. HEENT: No bruits, no goiter. Heart: Regular rate and rhythm, without murmurs, rubs, gallops. Lungs: Clear to auscultation bilaterally. Abdomen: Soft, nontender, nondistended, positive bowel sounds. Extremities: No clubbing cyanosis or edema with positive pedal pulses. Neuro: Grossly intact, nonfocal.    Lab Results: Basic Metabolic Panel:  Recent Labs  96/29/52 0701 08/21/12 1040 08/22/12 0450  NA 137  --  136  K 4.0  --  3.3*  CL 99  --  96  CO2  --   --  31  GLUCOSE 228*  --  126*  BUN 29*  --  25*  CREATININE 1.60* 1.51* 1.61*  CALCIUM  --   --  9.5   CBC:  Recent Labs  08/21/12 0649 08/21/12 0701 08/21/12 1040  WBC 8.2  --  8.2  NEUTROABS 6.1  --   --   HGB 11.4* 11.9* 11.7*  HCT 33.0* 35.0* 34.3*  MCV 91.2  --  91.5  PLT 197  --  194   Cardiac Enzymes:  Recent Labs  08/21/12 1040 08/21/12 1611 08/21/12 2119  TROPONINI <0.30 <0.30 <0.30   BNP:  Recent Labs  08/21/12 1611 08/22/12 0450  PROBNP 2437.0* 2690.0*   CBG:  Recent Labs  08/21/12 1122 08/21/12 1608 08/21/12 2102 08/22/12 0658 08/22/12 1120 08/22/12 1607  GLUCAP 213* 272* 150* 123* 183* 186*   Coagulation:  Recent Labs  08/21/12 0649  LABPROT 13.2  INR  1.01    Recent Results (from the past 240 hour(s))  CULTURE, BLOOD (ROUTINE X 2)     Status: None   Collection Time    08/21/12  6:40 AM      Result Value Range Status   Specimen Description BLOOD LEFT BRACHIAL ARTERY   Final   Special Requests BOTTLES DRAWN AEROBIC ONLY 10CC   Final   Culture  Setup Time 08/21/2012 11:16   Final   Culture     Final   Value:        BLOOD CULTURE RECEIVED NO GROWTH TO DATE CULTURE WILL BE HELD FOR 5 DAYS BEFORE ISSUING A FINAL NEGATIVE REPORT   Report Status PENDING   Incomplete  CULTURE, BLOOD (ROUTINE X 2)     Status: None   Collection Time    08/21/12  6:45 AM      Result Value Range Status   Specimen Description BLOOD RIGHT WRIST   Final   Special Requests BOTTLES DRAWN AEROBIC ONLY 8CC   Final   Culture  Setup Time 08/21/2012 11:16   Final   Culture     Final   Value:        BLOOD CULTURE RECEIVED NO GROWTH TO DATE CULTURE WILL BE HELD FOR  5 DAYS BEFORE ISSUING A FINAL NEGATIVE REPORT   Report Status PENDING   Incomplete    Studies/Results: Dg Chest Portable 1 View  08/21/2012  *RADIOLOGY REPORT*  Clinical Data: Shortness of breath; ST elevation myocardial infarction.  PORTABLE CHEST - 1 VIEW  Comparison: Chest radiograph performed 08/19/2012  Findings: The lungs are well-aerated.  Bilateral airspace opacification is noted, with sparing of the lung apices.  This is more prominent on the right side.  This may reflect mildly asymmetric pulmonary edema or possibly pneumonia.  There is no evidence of pleural effusion or pneumothorax.  The cardiomediastinal silhouette is borderline normal in size.  A pacemaker is noted overlying the left chest wall, with leads ending overlying the right atrium and right ventricle.  No acute osseous abnormalities are seen.  IMPRESSION: Bilateral airspace opacification, more prominent on the right. This may reflect mildly asymmetric pulmonary edema or possibly pneumonia.   Original Report Authenticated By: Tonia Ghent,  M.D.     Medications: Scheduled Meds: . apixaban  2.5 mg Oral BID  . dutasteride  0.5 mg Oral Daily  . ferrous sulfate  325 mg Oral Q breakfast  . furosemide  120 mg Oral BID  . insulin aspart  0-9 Units Subcutaneous TID WC  . insulin glargine  13 Units Subcutaneous QHS  . isosorbide mononitrate  60 mg Oral Daily  . levofloxacin (LEVAQUIN) IV  750 mg Intravenous Q48H  . metoprolol tartrate  25 mg Oral BID  . multivitamin with minerals  1 tablet Oral Daily  . potassium chloride SA  20 mEq Oral Daily  . sodium chloride  3 mL Intravenous Q12H  . Tafluprost  1 drop Both Eyes QODAY   Continuous Infusions:  PRN Meds:.sodium chloride, acetaminophen, albuterol, benzonatate, HYDROcodone-acetaminophen, nitroGLYCERIN, ondansetron (ZOFRAN) IV, polyethylene glycol, sodium chloride  Assessment/Plan:  Principal Problem:   Acute CHF (congestive heart failure) Active Problems:   Hypertension   Diabetes mellitus   CKD (chronic kidney disease), stage III   Paroxysmal atrial fibrillation   Acute on Chronic Diastolic CHF -4.1L negative since admission! -Symptomatically improved. -Pacemaker has been reprogrammed to improve diastolic function. -BB has been increased to prolong diastolic filling time. -Has been converted to PO lasix 120 BID. -Do not believe PNA is playing a role in his symptoms given lack of subjective symptoms and equivocal CXR. -Will opt to DC antibiotics at this point.  A Fib -Found on device interrogation -Has been started on Eliquis 2.5 mg BID as stroke prophylaxis.  CKD Stage III -Cr at baseline of 1.5-1.6 despite diuresis.  Disposition -Plan for DC home in 24-48 hours.  Time spent coordinating care: 35 minutes   LOS: 1 day   [facility] Pager: [phone] 08/22/2012, 5:06 PM

## 2012-08-22 NOTE — Progress Notes (Signed)
*  PRELIMINARY RESULTS* Vascular Ultrasound Renal Artery Duplex has been completed.   There is no obvious evidence of hemodynamically significant renal artery stenosis bilaterally. There is evidence of elevated intrarenal resistive indices bilaterally.   08/22/2012 10:21 AM Gertie Fey, RDMS, RDCS

## 2012-08-22 NOTE — Progress Notes (Addendum)
Patient Name: [name]. Date of Encounter: 08/22/2012    SUBJECTIVE: He had brisk urine output yesterday. He denies chest pain and dizziness. He denies syncope.  TELEMETRY:  AV pacing: Filed Vitals:   08/21/12 1453 08/21/12 2100 08/22/12 0646 08/22/12 1044  BP: 119/53 115/56 130/60 114/64  Pulse: 75 50 60 61  Temp: 97.7 F (36.5 C) 98.1 F (36.7 C) 97.5 F (36.4 C) 98.4 F (36.9 C)  TempSrc: Oral Oral Oral Oral  Resp: 20 20 20 20   Height:      Weight:   88.95 kg (196 lb 1.6 oz)   SpO2: 99% 96% 93% 97%    Intake/Output Summary (Last 24 hours) at 08/22/12 1246 Last data filed at 08/22/12 1047  Gross per 24 hour  Intake    483 ml  Output   3550 ml  Net  -3067 ml   Cumulative I&O since admission: -3500 cc  LABS: Basic Metabolic Panel:  Recent Labs  16/10/96 0701 08/21/12 1040 08/22/12 0450  NA 137  --  136  K 4.0  --  3.3*  CL 99  --  96  CO2  --   --  31  GLUCOSE 228*  --  126*  BUN 29*  --  25*  CREATININE 1.60* 1.51* 1.61*  CALCIUM  --   --  9.5   CBC:  Recent Labs  08/21/12 0649 08/21/12 0701 08/21/12 1040  WBC 8.2  --  8.2  NEUTROABS 6.1  --   --   HGB 11.4* 11.9* 11.7*  HCT 33.0* 35.0* 34.3*  MCV 91.2  --  91.5  PLT 197  --  194   Cardiac Enzymes:  Recent Labs  08/21/12 1040 08/21/12 1611 08/21/12 2119  TROPONINI <0.30 <0.30 <0.30   Renal Doppler: no evidence renal artery stenosis  Radiology/Studies:  No new data  Physical Exam: Blood pressure 114/64, pulse 61, temperature 98.4 F (36.9 C), temperature source Oral, resp. rate 20, height 5\' 7"  (1.702 m), weight 88.95 kg (196 lb 1.6 oz), SpO2 97.00%. Weight change:    No JVD  Lungs are clear posteriorly  S4 gallop  No edema  ASSESSMENT:  1. Nice diuresis without impairment of kidney function. Creatinine today remains at 1.6  2. Acute on chronic diastolic heart failure, clinically improved  3. Paroxysmal atrial fibrillation identified on pacemaker  telemetry  Plan:  1. Pacemaker was reprogrammed yesterday to hopefully optimize diastolic function and management. Further increase beta blocker dose to prolong diastolic filling time.  2. We'll start anticoagulation therapy and stop aspirin. We'll use Eliquis b.i.d. 2.5 mg  3. Convert diuretic therapy to oral after this a.m. IV dose  4. Consider home or weekend if remains stable and heart failure cleared  Selinda Eon 08/22/2012, 12:46 PM

## 2012-08-22 NOTE — Care Management Note (Signed)
    Page 1 of 1   08/22/2012     10:57:30 AM   CARE MANAGEMENT NOTE 08/22/2012  Patient:  [name], [name]   Account Number:  [id number]  Date Initiated:  08/22/2012  Documentation initiated by:  Oletta Cohn  Subjective/Objective Assessment:   79 year old African American male with a past medical history of diastolic heart failure, diabetes, hypertension, coronary artery disease/ SOB     Action/Plan:   home with spouse and caregiver/ home with home health   Anticipated DC Date:  08/24/2012   Anticipated DC Plan:  HOME W HOME HEALTH SERVICES      DC Planning Services  CM consult      [facility] Choice  NA   Choice offered to / List presented to:  C-1 Patient           Status of service:  Completed, signed off Medicare Important Message given?   (If response is "NO", the following Medicare IM given date fields will be blank) Date Medicare IM given:   Date Additional Medicare IM given:    Discharge Disposition:    Per UR Regulation:  Reviewed for med. necessity/level of care/duration of stay  If discussed at Long Length of Stay Meetings, dates discussed:    Comments:  08/22/12 @ 1000.Marland KitchenMarland KitchenOletta Cohn, RN, BSN, [employer] [phone] Spoke with pt and caregiver regarding discharge planning and [facility] services.  Pt and cargiver agree that they have plenty of nurses "checking in on them" and he is on the go too much, so at this time, they will decline services.  CM will follow up prior to d/c home.

## 2012-08-23 DIAGNOSIS — Z950 Presence of cardiac pacemaker: Secondary | ICD-10-CM

## 2012-08-23 LAB — BASIC METABOLIC PANEL
Calcium: 9.7 mg/dL (ref 8.4–10.5)
Creatinine, Ser: 1.82 mg/dL — ABNORMAL HIGH (ref 0.50–1.35)
GFR calc non Af Amer: 31 mL/min — ABNORMAL LOW (ref >90)
Glucose, Bld: 177 mg/dL — ABNORMAL HIGH (ref 70–99)
Sodium: 136 mEq/L (ref 135–145)

## 2012-08-23 LAB — GLUCOSE, CAPILLARY: Glucose-Capillary: 286 mg/dL — ABNORMAL HIGH (ref 70–99)

## 2012-08-23 MED ORDER — POLYETHYLENE GLYCOL 3350 17 G PO PACK
17.0000 g | PACK | Freq: Every day | ORAL | Status: DC
  Administered 2012-08-23: 17 g via ORAL
  Filled 2012-08-23 (×2): qty 1

## 2012-08-23 NOTE — Progress Notes (Signed)
[facility]             Progress Note   Subjective: Feels less SOB today. Denies CP.  Objective: Vital signs in last 24 hours: Temp:  [97.4 F (36.3 C)-98.4 F (36.9 C)] 97.6 F (36.4 C) (04/12 0541) Pulse Rate:  [57-65] 65 (04/12 0541) Resp:  [20] 20 (04/12 0541) BP: (113-122)/(52-76) 121/72 mmHg (04/12 0958) SpO2:  [94 %-97 %] 97 % (04/12 0541) FiO2 (%):  [2 %] 2 % (04/12 0541) Weight:  [88.5 kg (195 lb 1.7 oz)] 88.5 kg (195 lb 1.7 oz) (04/12 0541) Weight change: -2.1 kg (-4 lb 10.1 oz) Last BM Date: 08/22/12  Intake/Output from previous day: 04/11 0701 - 04/12 0700 In: 606 [P.O.:600; I.V.:6] Out: 2350 [Urine:2350] Total I/O In: 360 [P.O.:360] Out: 375 [Urine:375]   Physical Exam: General: Alert, awake, oriented x3, in no acute distress. HEENT: No bruits, no goiter. Heart: Regular rate and rhythm, without murmurs, rubs, gallops. Lungs: Clear to auscultation bilaterally. Abdomen: Soft, nontender, nondistended, positive bowel sounds. Extremities: No clubbing cyanosis or edema with positive pedal pulses. Neuro: Grossly intact, nonfocal.    Lab Results: Basic Metabolic Panel:  Recent Labs  16/10/96 0450 08/23/12 0635  NA 136 136  K 3.3* 3.4*  CL 96 94*  CO2 31 35*  GLUCOSE 126* 177*  BUN 25* 31*  CREATININE 1.61* 1.82*  CALCIUM 9.5 9.7   CBC:  Recent Labs  08/21/12 0649 08/21/12 0701 08/21/12 1040  WBC 8.2  --  8.2  NEUTROABS 6.1  --   --   HGB 11.4* 11.9* 11.7*  HCT 33.0* 35.0* 34.3*  MCV 91.2  --  91.5  PLT 197  --  194   Cardiac Enzymes:  Recent Labs  08/21/12 1040 08/21/12 1611 08/21/12 2119  TROPONINI <0.30 <0.30 <0.30   BNP:  Recent Labs  08/21/12 1611 08/22/12 0450  PROBNP 2437.0* 2690.0*   CBG:  Recent Labs  08/21/12 2102 08/22/12 0658 08/22/12 1120 08/22/12 1607 08/22/12 2103 08/23/12 0611  GLUCAP 150* 123* 183* 186* 160* 163*   Coagulation:  Recent Labs  08/21/12 0649  LABPROT 13.2  INR  1.01    Recent Results (from the past 240 hour(s))  CULTURE, BLOOD (ROUTINE X 2)     Status: None   Collection Time    08/21/12  6:40 AM      Result Value Range Status   Specimen Description BLOOD LEFT BRACHIAL ARTERY   Final   Special Requests BOTTLES DRAWN AEROBIC ONLY 10CC   Final   Culture  Setup Time 08/21/2012 11:16   Final   Culture     Final   Value:        BLOOD CULTURE RECEIVED NO GROWTH TO DATE CULTURE WILL BE HELD FOR 5 DAYS BEFORE ISSUING A FINAL NEGATIVE REPORT   Report Status PENDING   Incomplete  CULTURE, BLOOD (ROUTINE X 2)     Status: None   Collection Time    08/21/12  6:45 AM      Result Value Range Status   Specimen Description BLOOD RIGHT WRIST   Final   Special Requests BOTTLES DRAWN AEROBIC ONLY 8CC   Final   Culture  Setup Time 08/21/2012 11:16   Final   Culture     Final   Value:        BLOOD CULTURE RECEIVED NO GROWTH TO DATE CULTURE WILL BE HELD FOR 5 DAYS BEFORE ISSUING A FINAL NEGATIVE REPORT   Report Status PENDING  Incomplete    Studies/Results: No results found.  Medications: Scheduled Meds: . apixaban  2.5 mg Oral BID  . dutasteride  0.5 mg Oral Daily  . ferrous sulfate  325 mg Oral Q breakfast  . insulin aspart  0-9 Units Subcutaneous TID WC  . insulin glargine  13 Units Subcutaneous QHS  . isosorbide mononitrate  60 mg Oral Daily  . metoprolol tartrate  25 mg Oral BID  . multivitamin with minerals  1 tablet Oral Daily  . polyethylene glycol  17 g Oral Daily  . potassium chloride SA  20 mEq Oral Daily  . sodium chloride  3 mL Intravenous Q12H  . Tafluprost  1 drop Both Eyes QODAY  . torsemide  60 mg Oral BID   Continuous Infusions:  PRN Meds:.sodium chloride, acetaminophen, albuterol, benzonatate, HYDROcodone-acetaminophen, nitroGLYCERIN, ondansetron (ZOFRAN) IV, polyethylene glycol, sodium chloride  Assessment/Plan:  Principal Problem:   Acute CHF (congestive heart failure) Active Problems:   Hypertension   Diabetes mellitus    CKD (chronic kidney disease), stage III   Paroxysmal atrial fibrillation   Acute on Chronic Diastolic CHF -4.3L negative since admission. -Symptomatically improved. -Pacemaker has been reprogrammed to improve diastolic function. -BB has been increased to prolong diastolic filling time. -Has been converted to PO torsemide 60 mg twice a day.  A Fib -Found on device interrogation -Has been started on Eliquis 2.5 mg BID as stroke prophylaxis.  CKD Stage III -Creatinine slightly increased today to 1.8 from his baseline of 1.5-1.6. -Monitor carefully with diuresis.  Disposition -Plan for DC home in 24-48 hours.  Time spent coordinating care: 35 minutes   LOS: 2 days   [facility] Pager: [phone] 08/23/2012, 12:43 PM

## 2012-08-23 NOTE — Progress Notes (Signed)
Subjective:  No complaints, feels better. The short of breath.  Objective:  Vital Signs in the last 24 hours: Temp:  [97.4 F (36.3 C)-98.4 F (36.9 C)] 97.6 F (36.4 C) (04/12 0541) Pulse Rate:  [57-65] 65 (04/12 0541) Resp:  [20] 20 (04/12 0541) BP: (113-122)/(52-76) 121/72 mmHg (04/12 0958) SpO2:  [94 %-97 %] 97 % (04/12 0541) FiO2 (%):  [2 %] 2 % (04/12 0541) Weight:  [88.5 kg (195 lb 1.7 oz)] 88.5 kg (195 lb 1.7 oz) (04/12 0541)  Intake/Output from previous day: 04/11 0701 - 04/12 0700 In: 606 [P.O.:600; I.V.:6] Out: 2350 [Urine:2350]   Physical Exam: General: Well developed, well nourished, in no acute distress. Head:  Normocephalic and atraumatic. Lungs: Mild inspiratory crackles heard bilaterally at bases. Heart: Normal S1 and S2. Positive S4  No murmur Abdomen: soft, non-tender, positive bowel sounds. Extremities: No clubbing or cyanosis. No edema. Neurologic: Alert and oriented x 3.    Lab Results:  Recent Labs  08/21/12 0649 08/21/12 0701 08/21/12 1040  WBC 8.2  --  8.2  HGB 11.4* 11.9* 11.7*  PLT 197  --  194    Recent Labs  08/22/12 0450 08/23/12 0635  NA 136 136  K 3.3* 3.4*  CL 96 94*  CO2 31 35*  GLUCOSE 126* 177*  BUN 25* 31*  CREATININE 1.61* 1.82*    Recent Labs  08/21/12 1611 08/21/12 2119  TROPONINI <0.30 <0.30    Telemetry: A-V pacing Personally viewed.   Assessment/Plan:  Principal Problem:   Acute CHF (congestive heart failure) Active Problems:   Hypertension   Diabetes mellitus   CKD (chronic kidney disease), stage III   Paroxysmal atrial fibrillation   79 year old with recent readmission for acute on chronic diastolic heart failure.  1. Acute on chronic diastolic heart failure-excellent diuresis. I spoke with Dr. Katrinka Blazing on the phone this morning. He had changed him over to torsemide. We will keep him today to see how he does on this medication. This may help with overall absorption of medication. Creatinine is  slightly elevated today. We will monitor tomorrow morning. Net output -4.3 L.  2. Chronic kidney disease-creatinine now 1.8 from 1.6. Monitoring. Likely a result of diuresis. Continue with torsemide 60 mg twice a day.  3. Hypertension-well controlled.  4. Hypokalemia-3.4-replete.  5. Atrial fibrillation-anticoagulation with apixiban 2.5mg  BID  6. Pacemaker-functioning well.   [name] 08/23/2012, 12:04 PM

## 2012-08-24 DIAGNOSIS — J9600 Acute respiratory failure, unspecified whether with hypoxia or hypercapnia: Secondary | ICD-10-CM

## 2012-08-24 LAB — BASIC METABOLIC PANEL
GFR calc non Af Amer: 37 mL/min — ABNORMAL LOW (ref >90)
Glucose, Bld: 148 mg/dL — ABNORMAL HIGH (ref 70–99)
Potassium: 4.5 mEq/L (ref 3.5–5.1)
Sodium: 131 mEq/L — ABNORMAL LOW (ref 135–145)

## 2012-08-24 LAB — GLUCOSE, CAPILLARY: Glucose-Capillary: 154 mg/dL — ABNORMAL HIGH (ref 70–99)

## 2012-08-24 MED ORDER — APIXABAN 2.5 MG PO TABS: 2.5000 mg | ORAL_TABLET | Freq: Two times a day (BID) | ORAL | Status: DC

## 2012-08-24 MED ORDER — METOPROLOL TARTRATE 25 MG PO TABS: 25.0000 mg | ORAL_TABLET | Freq: Two times a day (BID) | ORAL | Status: DC

## 2012-08-24 MED ORDER — TORSEMIDE 20 MG PO TABS: 60.0000 mg | ORAL_TABLET | Freq: Two times a day (BID) | ORAL | Status: DC

## 2012-08-24 MED ORDER — ISOSORBIDE MONONITRATE ER 60 MG PO TB24: 60.0000 mg | ORAL_TABLET | Freq: Every day | ORAL | Status: DC

## 2012-08-24 NOTE — Progress Notes (Signed)
Subjective:  About to go home. Feels great.  Objective:  Vital Signs in the last 24 hours: Temp:  [98.2 F (36.8 C)-98.5 F (36.9 C)] 98.5 F (36.9 C) (04/13 0501) Pulse Rate:  [69-70] 70 (04/13 0501) Resp:  [18] 18 (04/13 0501) BP: (112-132)/(55-64) 112/55 mmHg (04/13 0501) SpO2:  [94 %-100 %] 94 % (04/13 0501) Weight:  [84.959 kg (187 lb 4.8 oz)] 84.959 kg (187 lb 4.8 oz) (04/13 0501)  Intake/Output from previous day: 04/12 0701 - 04/13 0700 In: 983 [P.O.:980; I.V.:3] Out: 1975 [Urine:1975]   Physical Exam: General: Well developed, well nourished, in no acute distress. Head:  Normocephalic and atraumatic. Lungs: Clear to auscultation and percussion. Heart: Normal S1 and S2.  No murmur, rubs or gallops.  Abdomen: soft, non-tender, positive bowel sounds. Extremities: No clubbing or cyanosis. No edema. Neurologic: Alert and oriented x 3.    Lab Results:   Recent Labs  08/23/12 0635 08/24/12 0519  NA 136 131*  K 3.4* 4.5  CL 94* 93*  CO2 35* 23  GLUCOSE 177* 148*  BUN 31* 32*  CREATININE 1.82* 1.54*    Recent Labs  08/21/12 1611 08/21/12 2119  TROPONINI <0.30 <0.30  Assessment/Plan:    79 year old with recent readmission for acute on chronic diastolic heart failure.   1. Acute on chronic diastolic heart failure-excellent diuresis. I spoke with Dr. Katrinka Blazing on the phone yesterday morning. He had changed him over to torsemide. Doing well. This may help with overall absorption of medication. Creatinine 1.5 improved today.   Net output -5.3 L.  WEIGHT 84.95 from 91.2 Kg  2. Chronic kidney disease-creatinine now 1.5 stable.Continue with torsemide 60 mg twice a day.   3. Hypertension-well controlled.   4. Hypokalemia--replete.   5. Atrial fibrillation-anticoagulation with apixiban 2.5mg  BID   6. Pacemaker-functioning well.    Dr. Katrinka Blazing to see next week in clinic.    Mae Denunzio 08/24/2012, 11:17 AM

## 2012-08-24 NOTE — Discharge Summary (Signed)
Physician Discharge Summary  Patient ID: [name]. MRN: [id number] DOB/AGE: October 14, 1918 79 y.o.  Admit date: 08/21/2012 Discharge date: 08/24/2012  Primary Care Physician:  [name], MD   Discharge Diagnoses:    Principal Problem:   Acute CHF (congestive heart failure) Active Problems:   Hypertension   Diabetes mellitus   CKD (chronic kidney disease), stage III   Paroxysmal atrial fibrillation      Medication List    STOP taking these medications       aspirin 81 MG tablet     furosemide 80 MG tablet  Commonly known as:  LASIX     moxifloxacin 400 MG tablet  Commonly known as:  AVELOX      TAKE these medications       amLODipine 10 MG tablet  Commonly known as:  NORVASC  Take 10 mg by mouth daily.     apixaban 2.5 MG Tabs tablet  Commonly known as:  ELIQUIS  Take 1 tablet (2.5 mg total) by mouth 2 (two) times daily.     benzonatate 100 MG capsule  Commonly known as:  TESSALON PERLES  Take 1 capsule (100 mg total) by mouth 3 (three) times daily as needed for cough.     dutasteride 0.5 MG capsule  Commonly known as:  AVODART  Take 0.5 mg by mouth daily.     ferrous sulfate 325 (65 FE) MG tablet  Take 325 mg by mouth daily with breakfast.     HYDROcodone-acetaminophen 7.5-325 MG per tablet  Commonly known as:  NORCO  Take 1 tablet by mouth every 6 (six) hours as needed for pain.     insulin glargine 100 UNIT/ML injection  Commonly known as:  LANTUS  Inject 13 Units into the skin at bedtime.     insulin lispro 100 UNIT/ML injection  Commonly known as:  HUMALOG  Inject 7-9 Units into the skin 3 (three) times daily before meals. 7 units with breakfast and lunch, 9 units after supper     isosorbide mononitrate 60 MG 24 hr tablet  Commonly known as:  IMDUR  Take 1 tablet (60 mg total) by mouth daily.     metoprolol tartrate 25 MG tablet  Commonly known as:  LOPRESSOR  Take 1 tablet (25 mg total) by mouth 2 (two) times daily.     multivitamin with minerals Tabs  Take 1 tablet by mouth daily. Diabetic pak by Ashby Dawes from [facility]     nitroGLYCERIN 0.4 MG SL tablet  Commonly known as:  NITROSTAT  Place 0.4 mg under the tongue every 5 (five) minutes as needed for chest pain.     polyethylene glycol packet  Commonly known as:  MIRALAX / GLYCOLAX  Take 17 g by mouth daily as needed. For constipation     potassium chloride SA 20 MEQ tablet  Commonly known as:  K-DUR,KLOR-CON  Take 20 mEq by mouth daily.     SYSTANE OP  Place 1 drop into both eyes 2 (two) times daily as needed. For dry eyes     torsemide 20 MG tablet  Commonly known as:  DEMADEX  Take 3 tablets (60 mg total) by mouth 2 (two) times daily.     valsartan 160 MG tablet  Commonly known as:  DIOVAN  Take 160 mg by mouth every morning.     ZIOPTAN 0.0015 % Soln  Generic drug:  Tafluprost  Place 1 drop into both eyes every other day. ONLY PRESERVATIVE FREE  Disposition and Follow-up:  Patient will be discharged home today in stable and improved condition. Has been advised secure a followup with his cardiologist Dr. Katrinka Blazing in about one week.  Consults:  Cardiology, Dr. Katrinka Blazing.   Significant Diagnostic Studies:  No results found.  Brief H and P: For complete details please refer to admission H and P, but in brief patient is a 79 year old African American male with a past medical history of diastolic heart failure, diabetes, hypertension, coronary artery disease was just recently discharged from the hospital presented today morning to the emergency room after having 2 bouts of shortness of breath early this morning. Apparently around 3:00 in the morning, patient woke up short of breath, subsequently EMS was called and patient refused to come to the emergency room, unfortunately this reoccurred a little while later. This was associated with some vague chest pain and diaphoresis. Patient then was brought to the emergency room, point-of-care  troponins were elevated, chest x-ray was suggestive of decompensated acute diastolic heart failure, I was subsequently asked to admit this patient for further evaluation and treatment.     Hospital Course:  Principal Problem:   Acute CHF (congestive heart failure) Active Problems:   Hypertension   Diabetes mellitus   CKD (chronic kidney disease), stage III   Paroxysmal atrial fibrillation    Acute on Chronic Diastolic CHF  -5.4L negative since admission.  -Symptomatically improved.  -Pacemaker has been reprogrammed to improve diastolic function.  -BB has been increased to prolong diastolic filling time.  -Has been converted to PO torsemide 60 mg twice a day.   A Fib  -Found on device interrogation  -Has been started on Eliquis 2.5 mg BID for stroke prophylaxis.   CKD Stage III  -Creatinine is at baseline of between 1.5 and 1.6. -Monitor carefully with diuresis.   Time spent on Discharge: Greater than 30 minutes.  SignedChaya Jan [facility] Pager: [phone] 08/24/2012, 11:13 AM

## 2012-08-26 ENCOUNTER — Emergency Department (HOSPITAL_COMMUNITY)
Admission: EM | Admit: 2012-08-26 | Discharge: 2012-08-26 | Disposition: A | Discharge status: Home / Self Care | Payer: Medicare Other | Attending: Emergency Medicine | Admitting: Emergency Medicine

## 2012-08-26 ENCOUNTER — Encounter (HOSPITAL_COMMUNITY): Payer: Self-pay | Admitting: *Deleted

## 2012-08-26 ENCOUNTER — Ambulatory Visit (HOSPITAL_COMMUNITY): Admission: RE | Admit: 2012-08-26 | Payer: Medicare Other | Source: Ambulatory Visit

## 2012-08-26 DIAGNOSIS — R339 Retention of urine, unspecified: Secondary | ICD-10-CM

## 2012-08-26 DIAGNOSIS — Z8739 Personal history of other diseases of the musculoskeletal system and connective tissue: Secondary | ICD-10-CM | POA: Insufficient documentation

## 2012-08-26 DIAGNOSIS — M79609 Pain in unspecified limb: Secondary | ICD-10-CM | POA: Insufficient documentation

## 2012-08-26 DIAGNOSIS — Z8701 Personal history of pneumonia (recurrent): Secondary | ICD-10-CM | POA: Insufficient documentation

## 2012-08-26 DIAGNOSIS — Z950 Presence of cardiac pacemaker: Secondary | ICD-10-CM | POA: Insufficient documentation

## 2012-08-26 DIAGNOSIS — E119 Type 2 diabetes mellitus without complications: Secondary | ICD-10-CM | POA: Insufficient documentation

## 2012-08-26 DIAGNOSIS — I10 Essential (primary) hypertension: Secondary | ICD-10-CM | POA: Insufficient documentation

## 2012-08-26 DIAGNOSIS — Z862 Personal history of diseases of the blood and blood-forming organs and certain disorders involving the immune mechanism: Secondary | ICD-10-CM | POA: Insufficient documentation

## 2012-08-26 DIAGNOSIS — Z8639 Personal history of other endocrine, nutritional and metabolic disease: Secondary | ICD-10-CM | POA: Insufficient documentation

## 2012-08-26 DIAGNOSIS — N289 Disorder of kidney and ureter, unspecified: Secondary | ICD-10-CM | POA: Insufficient documentation

## 2012-08-26 DIAGNOSIS — M79605 Pain in left leg: Secondary | ICD-10-CM

## 2012-08-26 DIAGNOSIS — I2510 Atherosclerotic heart disease of native coronary artery without angina pectoris: Secondary | ICD-10-CM | POA: Insufficient documentation

## 2012-08-26 DIAGNOSIS — Z794 Long term (current) use of insulin: Secondary | ICD-10-CM | POA: Insufficient documentation

## 2012-08-26 DIAGNOSIS — Z8679 Personal history of other diseases of the circulatory system: Secondary | ICD-10-CM | POA: Insufficient documentation

## 2012-08-26 DIAGNOSIS — D649 Anemia, unspecified: Secondary | ICD-10-CM | POA: Insufficient documentation

## 2012-08-26 DIAGNOSIS — M129 Arthropathy, unspecified: Secondary | ICD-10-CM | POA: Insufficient documentation

## 2012-08-26 DIAGNOSIS — I509 Heart failure, unspecified: Secondary | ICD-10-CM | POA: Insufficient documentation

## 2012-08-26 DIAGNOSIS — Z79899 Other long term (current) drug therapy: Secondary | ICD-10-CM | POA: Insufficient documentation

## 2012-08-26 LAB — COMPREHENSIVE METABOLIC PANEL
ALT: 27 U/L (ref 0–53)
AST: 33 U/L (ref 0–37)
Alkaline Phosphatase: 71 U/L (ref 39–117)
CO2: 36 mEq/L — ABNORMAL HIGH (ref 19–32)
Chloride: 87 mEq/L — ABNORMAL LOW (ref 96–112)
GFR calc non Af Amer: 20 mL/min — ABNORMAL LOW (ref >90)
Potassium: 3.6 mEq/L (ref 3.5–5.1)
Sodium: 132 mEq/L — ABNORMAL LOW (ref 135–145)
Total Bilirubin: 0.3 mg/dL (ref 0.3–1.2)

## 2012-08-26 LAB — URINALYSIS, ROUTINE W REFLEX MICROSCOPIC
Bilirubin Urine: NEGATIVE
Glucose, UA: NEGATIVE mg/dL
Ketones, ur: NEGATIVE mg/dL
Leukocytes, UA: NEGATIVE
Specific Gravity, Urine: 1.014 (ref 1.005–1.030)
pH: 5 (ref 5.0–8.0)

## 2012-08-26 LAB — CBC WITH DIFFERENTIAL/PLATELET
Basophils Absolute: 0 10*3/uL (ref 0.0–0.1)
HCT: 35.7 % — ABNORMAL LOW (ref 39.0–52.0)
Lymphocytes Relative: 31 % (ref 12–46)
Monocytes Absolute: 0.8 10*3/uL (ref 0.1–1.0)
Neutro Abs: 5.2 10*3/uL (ref 1.7–7.7)
Neutrophils Relative %: 57 % (ref 43–77)
Platelets: 262 10*3/uL (ref 150–400)
RDW: 13.6 % (ref 11.5–15.5)
WBC: 9.1 10*3/uL (ref 4.0–10.5)

## 2012-08-26 MED ORDER — DOXYCYCLINE HYCLATE 100 MG PO CAPS: 100.0000 mg | ORAL_CAPSULE | Freq: Two times a day (BID) | ORAL | Status: DC

## 2012-08-26 NOTE — ED Notes (Signed)
Pt states that he has a hx of CHF. Pt states that he feels like his electorlytes are out of balance and he has been having leg cramps. Pt states that he has also been having difficulty urinating and he knows that if he lets his fluid build up then he gets SOB with his CHF. Pt wants to be seen prior to him being too fluid overloaded and unable to breath. Pt is not in any respiratory distress at this time. Pt denies pain other than ocassional cramping.

## 2012-08-26 NOTE — ED Notes (Signed)
Foley switched to leg bag, tolerated it well.

## 2012-08-26 NOTE — ED Provider Notes (Signed)
History     CSN: [id number]  Arrival date & time 08/26/12  0022   First MD Initiated Contact with Patient 08/26/12 0234      Chief Complaint  Patient presents with  . Leg Pain    cramping  . Dysuria    (Consider location/radiation/quality/duration/timing/severity/associated sxs/prior treatment) Patient is a 79 y.o. male presenting with leg pain. The history is provided by the patient.  Leg Pain Location:  Leg Injury: no   Leg location:  L leg Pain details:    Quality:  Cramping   Radiates to:  Does not radiate   Severity:  Moderate   Onset quality:  Gradual   Timing:  Intermittent   Progression:  Unchanged Chronicity:  Recurrent Dislocation: no   Foreign body present:  No foreign bodies Relieved by:  Nothing Worsened by:  Nothing tried Ineffective treatments:  None tried Associated symptoms: no back pain   Also with 16 hours of urinary retention  Past Medical History  Diagnosis Date  . CHF (congestive heart failure)   . Diabetes mellitus   . Hypertension   . Hyperlipidemia   . Coronary artery disease   . Spinal stenosis   . Dysrhythmia   . Pneumonia   . Pacemaker   . Arthritis   . Anemia     Past Surgical History  Procedure Laterality Date  . Lumbar laminectomy    . Back surgery    . Total knee arthroplasty    . Quadriceps tendon repair    . Cataract extraction    . Insert / replace / remove pacemaker    . Carotid endarterectomy      Family History  Problem Relation Age of Onset  . Multiple myeloma Father   . Hypertension Mother     History  Substance Use Topics  . Smoking status: Never Smoker   . Smokeless tobacco: Never Used  . Alcohol Use: Yes     Comment: Cocktails occasionally      Review of Systems  Genitourinary: Positive for difficulty urinating.  Musculoskeletal: Negative for back pain.  All other systems reviewed and are negative.    Allergies  Statins  Home Medications   Current Outpatient Rx  Name  Route  Sig   Dispense  Refill  . amLODipine (NORVASC) 10 MG tablet   Oral   Take 10 mg by mouth daily.         [name] apixaban (ELIQUIS) 2.5 MG TABS tablet   Oral   Take 1 tablet (2.5 mg total) by mouth 2 (two) times daily.   60 tablet   1   . benzonatate (TESSALON PERLES) 100 MG capsule   Oral   Take 1 capsule (100 mg total) by mouth 3 (three) times daily as needed for cough.   30 capsule   0   . dutasteride (AVODART) 0.5 MG capsule   Oral   Take 0.5 mg by mouth daily.         . ferrous sulfate 325 (65 FE) MG tablet   Oral   Take 325 mg by mouth daily with breakfast.         . HYDROcodone-acetaminophen (NORCO) 7.5-325 MG per tablet   Oral   Take 1 tablet by mouth every 6 (six) hours as needed for pain.   100 tablet   5   . insulin glargine (LANTUS) 100 UNIT/ML injection   Subcutaneous   Inject 13 Units into the skin at bedtime.          [name]  insulin lispro (HUMALOG) 100 UNIT/ML injection   Subcutaneous   Inject 7-9 Units into the skin 3 (three) times daily before meals. 7 units with breakfast and lunch, 9 units after supper         . isosorbide mononitrate (IMDUR) 60 MG 24 hr tablet   Oral   Take 1 tablet (60 mg total) by mouth daily.   30 tablet   1   . metoprolol tartrate (LOPRESSOR) 25 MG tablet   Oral   Take 1 tablet (25 mg total) by mouth 2 (two) times daily.   60 tablet   1   . Multiple Vitamin (MULTIVITAMIN WITH MINERALS) TABS   Oral   Take 1 tablet by mouth daily. Diabetic pak by Ashby Dawes from [employer]         . nitroGLYCERIN (NITROSTAT) 0.4 MG SL tablet   Sublingual   Place 0.4 mg under the tongue every 5 (five) minutes as needed for chest pain.         Bertram Gala Glycol-Propyl Glycol (SYSTANE OP)   Both Eyes   Place 1 drop into both eyes 2 (two) times daily as needed. For dry eyes         . polyethylene glycol (MIRALAX / GLYCOLAX) packet   Oral   Take 17 g by mouth daily as needed. For constipation         . potassium chloride SA  (K-DUR,KLOR-CON) 20 MEQ tablet   Oral   Take 20 mEq by mouth daily.         . Tafluprost (ZIOPTAN) 0.0015 % SOLN   Both Eyes   Place 1 drop into both eyes every other day. ONLY PRESERVATIVE FREE         . torsemide (DEMADEX) 20 MG tablet   Oral   Take 3 tablets (60 mg total) by mouth 2 (two) times daily.   180 tablet   1     BP 137/67  Pulse 67  Temp(Src) 97.1 F (36.2 C) (Oral)  Resp 16  SpO2 95%  Physical Exam  Constitutional: He is oriented to person, place, and time. He appears well-developed and well-nourished. No distress.  HENT:  Head: Normocephalic and atraumatic.  Mouth/Throat: Oropharynx is clear and moist.  Eyes: Conjunctivae are normal. Pupils are equal, round, and reactive to light.  Neck: Normal range of motion. Neck supple.  Cardiovascular: Normal rate and regular rhythm.   Pulmonary/Chest: Effort normal and breath sounds normal. He has no wheezes. He has no rales.  Abdominal: Soft. Bowel sounds are normal. There is no tenderness. There is no rebound and no guarding.  Musculoskeletal: Normal range of motion. He exhibits no edema and no tenderness.  Neurological: He is alert and oriented to person, place, and time.  Skin: Skin is warm and dry.  Psychiatric: He has a normal mood and affect.    ED Course  Procedures (including critical care time)  Labs Reviewed  CBC WITH DIFFERENTIAL - Abnormal; Notable for the following:    RBC 3.96 (*)    Hemoglobin 12.6 (*)    HCT 35.7 (*)    All other components within normal limits  COMPREHENSIVE METABOLIC PANEL - Abnormal; Notable for the following:    Sodium 132 (*)    Chloride 87 (*)    CO2 36 (*)    Glucose, Bld 213 (*)    BUN 49 (*)    Creatinine, Ser 2.61 (*)    GFR calc non Af Amer 20 (*)  GFR calc Af Amer 23 (*)    All other components within normal limits  PRO B NATRIURETIC PEPTIDE - Abnormal; Notable for the following:    Pro B Natriuretic peptide (BNP) 1816.0 (*)    All other components  within normal limits  GLUCOSE, CAPILLARY - Abnormal; Notable for the following:    Glucose-Capillary 214 (*)    All other components within normal limits  URINALYSIS, ROUTINE W REFLEX MICROSCOPIC  D-DIMER, QUANTITATIVE   No results found.   No diagnosis found.    MDM  Urinary catheter inserts and 400 cc drained immediately  No CP no SOB, no swelling   Will leave catheter in place and will cover for bacteria with doxycycline.  Follow up with Dr. Brunilda Payor in 7 days for foley removal.  Will set of LLE doppler for leg pain.  Low suspicious for DVT.  Follow up with Dr. Chestine Spore for creatinine recheck and Dr. Katrinka Blazing regarding demedex dose.  Patient and family  Verbalize understanding and agree to follow up   Patrena Santalucia Smitty Cords, MD 08/26/12 [phone]

## 2012-08-26 NOTE — Progress Notes (Signed)
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 We found that the creatinine had jumped up to 2.61. He left the hospital and resumed Diovan. I have asked him to stop Diovan, amlodipine, potassium, L. a question, and Demadex. I will see him in our office at 10 AM on Wednesday morning we will repeat a potassium and creatinine level. I've asked him to take by mouth fluids liberally today.

## 2012-08-27 LAB — CULTURE, BLOOD (ROUTINE X 2)
Culture: NO GROWTH
Culture: NO GROWTH

## 2012-09-14 ENCOUNTER — Emergency Department (HOSPITAL_COMMUNITY)
Admission: EM | Admit: 2012-09-14 | Discharge: 2012-09-14 | Disposition: A | Discharge status: Home / Self Care | Payer: Medicare Other | Attending: Emergency Medicine | Admitting: Emergency Medicine

## 2012-09-14 ENCOUNTER — Encounter (HOSPITAL_COMMUNITY): Payer: Self-pay | Admitting: *Deleted

## 2012-09-14 ENCOUNTER — Emergency Department (HOSPITAL_COMMUNITY): Payer: Medicare Other

## 2012-09-14 DIAGNOSIS — Z8679 Personal history of other diseases of the circulatory system: Secondary | ICD-10-CM | POA: Insufficient documentation

## 2012-09-14 DIAGNOSIS — R339 Retention of urine, unspecified: Secondary | ICD-10-CM | POA: Insufficient documentation

## 2012-09-14 DIAGNOSIS — I509 Heart failure, unspecified: Secondary | ICD-10-CM | POA: Insufficient documentation

## 2012-09-14 DIAGNOSIS — Z8701 Personal history of pneumonia (recurrent): Secondary | ICD-10-CM | POA: Insufficient documentation

## 2012-09-14 DIAGNOSIS — I2510 Atherosclerotic heart disease of native coronary artery without angina pectoris: Secondary | ICD-10-CM | POA: Insufficient documentation

## 2012-09-14 DIAGNOSIS — I129 Hypertensive chronic kidney disease with stage 1 through stage 4 chronic kidney disease, or unspecified chronic kidney disease: Secondary | ICD-10-CM | POA: Insufficient documentation

## 2012-09-14 DIAGNOSIS — Z794 Long term (current) use of insulin: Secondary | ICD-10-CM | POA: Insufficient documentation

## 2012-09-14 DIAGNOSIS — E785 Hyperlipidemia, unspecified: Secondary | ICD-10-CM | POA: Insufficient documentation

## 2012-09-14 DIAGNOSIS — D649 Anemia, unspecified: Secondary | ICD-10-CM | POA: Insufficient documentation

## 2012-09-14 DIAGNOSIS — Z79899 Other long term (current) drug therapy: Secondary | ICD-10-CM | POA: Insufficient documentation

## 2012-09-14 DIAGNOSIS — Z950 Presence of cardiac pacemaker: Secondary | ICD-10-CM | POA: Insufficient documentation

## 2012-09-14 DIAGNOSIS — R0602 Shortness of breath: Secondary | ICD-10-CM | POA: Insufficient documentation

## 2012-09-14 DIAGNOSIS — E119 Type 2 diabetes mellitus without complications: Secondary | ICD-10-CM | POA: Insufficient documentation

## 2012-09-14 DIAGNOSIS — R252 Cramp and spasm: Secondary | ICD-10-CM | POA: Insufficient documentation

## 2012-09-14 DIAGNOSIS — N189 Chronic kidney disease, unspecified: Secondary | ICD-10-CM | POA: Insufficient documentation

## 2012-09-14 DIAGNOSIS — Z8739 Personal history of other diseases of the musculoskeletal system and connective tissue: Secondary | ICD-10-CM | POA: Insufficient documentation

## 2012-09-14 LAB — CBC WITH DIFFERENTIAL/PLATELET
Eosinophils Absolute: 0.2 10*3/uL (ref 0.0–0.7)
Hemoglobin: 11.7 g/dL — ABNORMAL LOW (ref 13.0–17.0)
Lymphocytes Relative: 32 % (ref 12–46)
Lymphs Abs: 1.8 10*3/uL (ref 0.7–4.0)
MCH: 31.4 pg (ref 26.0–34.0)
MCV: 91.2 fL (ref 78.0–100.0)
Monocytes Relative: 9 % (ref 3–12)
Neutrophils Relative %: 54 % (ref 43–77)
RBC: 3.73 MIL/uL — ABNORMAL LOW (ref 4.22–5.81)
WBC: 5.5 10*3/uL (ref 4.0–10.5)

## 2012-09-14 LAB — BASIC METABOLIC PANEL
BUN: 32 mg/dL — ABNORMAL HIGH (ref 6–23)
CO2: 33 mEq/L — ABNORMAL HIGH (ref 19–32)
GFR calc non Af Amer: 35 mL/min — ABNORMAL LOW (ref >90)
Glucose, Bld: 91 mg/dL (ref 70–99)
Potassium: 3.8 mEq/L (ref 3.5–5.1)
Sodium: 139 mEq/L (ref 135–145)

## 2012-09-14 LAB — URINALYSIS, ROUTINE W REFLEX MICROSCOPIC
Bilirubin Urine: NEGATIVE
Glucose, UA: NEGATIVE mg/dL
Hgb urine dipstick: NEGATIVE
Specific Gravity, Urine: 1.01 (ref 1.005–1.030)
Urobilinogen, UA: 0.2 mg/dL (ref 0.0–1.0)
pH: 6.5 (ref 5.0–8.0)

## 2012-09-14 NOTE — ED Notes (Signed)
Pt states he has been having difficulty with urination for the past cple of days. Seen in ED for same a cple wks ago and had foley placed for a few days. Pt states he may 'pass 100 cc'. No SOB or CP noted.

## 2012-09-14 NOTE — ED Provider Notes (Signed)
History     CSN: [id number]  Arrival date & time 09/14/12  0701   First MD Initiated Contact with Patient 09/14/12 [phone]      No chief complaint on file.   (Consider location/radiation/quality/duration/timing/severity/associated sxs/prior treatment) HPI Pt with history of CHF and CKD also seen recently for urinary retention and had indwelling foley for 2-3 days. Reports he was in his usual state of health yesterday but during the night noticed difficulty urinating. He had a bowel movement around 4am but only a small dribble of urine came out. He reports some occasional leg cramping, but denies any CP or SOB. No orthopnea although he has had 3lb weight gain since yesterday.   Past Medical History  Diagnosis Date  . CHF (congestive heart failure)   . Diabetes mellitus   . Hypertension   . Hyperlipidemia   . Coronary artery disease   . Spinal stenosis   . Dysrhythmia   . Pneumonia   . Pacemaker   . Arthritis   . Anemia     Past Surgical History  Procedure Laterality Date  . Lumbar laminectomy    . Back surgery    . Total knee arthroplasty    . Quadriceps tendon repair    . Cataract extraction    . Insert / replace / remove pacemaker    . Carotid endarterectomy      Family History  Problem Relation Age of Onset  . Multiple myeloma Father   . Hypertension Mother     History  Substance Use Topics  . Smoking status: Never Smoker   . Smokeless tobacco: Never Used  . Alcohol Use: Yes     Comment: Cocktails occasionally      Review of Systems All other systems reviewed and are negative except as noted in HPI.   Allergies  Statins  Home Medications   Current Outpatient Rx  Name  Route  Sig  Dispense  Refill  . amLODipine (NORVASC) 10 MG tablet   Oral   Take 10 mg by mouth daily.         [name] apixaban (ELIQUIS) 2.5 MG TABS tablet   Oral   Take 1 tablet (2.5 mg total) by mouth 2 (two) times daily.   60 tablet   1   . dutasteride (AVODART) 0.5 MG capsule   Oral   Take 0.5 mg by mouth daily.         . ferrous sulfate 325 (65 FE) MG tablet   Oral   Take 325 mg by mouth daily with breakfast.         . HYDROcodone-acetaminophen (NORCO) 7.5-325 MG per tablet   Oral   Take 1 tablet by mouth every 6 (six) hours as needed for pain.   100 tablet   5   . insulin glargine (LANTUS) 100 UNIT/ML injection   Subcutaneous   Inject 13 Units into the skin at bedtime.          . insulin lispro (HUMALOG) 100 UNIT/ML injection   Subcutaneous   Inject 7-9 Units into the skin 3 (three) times daily before meals. 7 units with breakfast and lunch, 9 units after supper         . isosorbide mononitrate (IMDUR) 60 MG 24 hr tablet   Oral   Take 1 tablet (60 mg total) by mouth daily.   30 tablet   1   . metoprolol tartrate (LOPRESSOR) 25 MG tablet   Oral   Take 1 tablet (25 mg  total) by mouth 2 (two) times daily.   60 tablet   1   . Multiple Vitamin (MULTIVITAMIN WITH MINERALS) TABS   Oral   Take 1 tablet by mouth daily. Diabetic pak by Ashby Dawes from [employer]         . nitroGLYCERIN (NITROSTAT) 0.4 MG SL tablet   Sublingual   Place 0.4 mg under the tongue every 5 (five) minutes as needed for chest pain.         Bertram Gala Glycol-Propyl Glycol (SYSTANE OP)   Both Eyes   Place 1 drop into both eyes 2 (two) times daily as needed. For dry eyes         . polyethylene glycol (MIRALAX / GLYCOLAX) packet   Oral   Take 17 g by mouth daily as needed. For constipation         . potassium chloride SA (K-DUR,KLOR-CON) 20 MEQ tablet   Oral   Take 20 mEq by mouth daily.         . Tafluprost (ZIOPTAN) 0.0015 % SOLN   Both Eyes   Place 1 drop into both eyes every other day. ONLY PRESERVATIVE FREE         . torsemide (DEMADEX) 20 MG tablet   Oral   Take 3 tablets (60 mg total) by mouth 2 (two) times daily.   180 tablet   1     BP 153/57  Temp(Src) 98.1 F (36.7 C) (Oral)  Resp 16  SpO2 100%  Physical Exam  Nursing note and  vitals reviewed. Constitutional: He is oriented to person, place, and time. He appears well-developed and well-nourished.  HENT:  Head: Normocephalic and atraumatic.  Eyes: EOM are normal. Pupils are equal, round, and reactive to light.  Neck: Normal range of motion. Neck supple.  Cardiovascular: Normal rate, normal heart sounds and intact distal pulses.   Pulmonary/Chest: Effort normal and breath sounds normal.  Abdominal: Bowel sounds are normal. He exhibits no distension. There is no tenderness.  Moderately enlarged bladder on bedside US  Musculoskeletal: Normal range of motion. He exhibits edema (trace edema bilateral LE). He exhibits no tenderness.  Neurological: He is alert and oriented to person, place, and time. He has normal strength. No cranial nerve deficit or sensory deficit.  Skin: Skin is warm and dry. No rash noted.  Psychiatric: He has a normal mood and affect.    ED Course  Procedures (including critical care time)  Labs Reviewed  CBC WITH DIFFERENTIAL - Abnormal; Notable for the following:    RBC 3.73 (*)    Hemoglobin 11.7 (*)    HCT 34.0 (*)    All other components within normal limits  BASIC METABOLIC PANEL - Abnormal; Notable for the following:    CO2 33 (*)    BUN 32 (*)    Creatinine, Ser 1.61 (*)    GFR calc non Af Amer 35 (*)    GFR calc Af Amer 41 (*)    All other components within normal limits  URINE CULTURE  URINALYSIS, ROUTINE W REFLEX MICROSCOPIC   Dg Chest 2 View  09/14/2012  *RADIOLOGY REPORT*  Clinical Data: CHF and weight gain.  CHEST - 2 VIEW  Comparison: 08/21/2012  Findings: Two views of the chest demonstrate a dual lead cardiac pacemaker.  There is improved aeration in the lungs compared to the prior examination.  Mild interstitial prominence without frank pulmonary edema.  Heart size is within normal limits.  No evidence for pleural effusions.  Mild  degenerative changes in the thoracic spine.  IMPRESSION: Mild interstitial prominence without  frank pulmonary edema.  No focal chest disease.   Original Report Authenticated By: Richarda Overlie, M.D.      1. Urinary retention       MDM   Date: 09/14/2012  Rate: 63  Rhythm: normal sinus rhythm  QRS Axis: left  Intervals: normal  ST/T Wave abnormalities: normal  Conduction Disutrbances:nonspecific intraventricular conduction delay  Narrative Interpretation:   Old EKG Reviewed: changes noted, previously paced   9:26 AM Pt feeling better with bladder decompression. Labs and imaging reviewed. No significant pulmonary edema, CKD is at baseline. Will d/c home with leg bag and Urology followup.        Charles B. Bernette Mayers, MD 09/14/12 [phone]

## 2012-09-15 LAB — URINE CULTURE
Colony Count: NO GROWTH
Culture: NO GROWTH

## 2012-10-16 ENCOUNTER — Encounter (HOSPITAL_COMMUNITY): Payer: Self-pay | Admitting: Pharmacy Technician

## 2012-11-07 ENCOUNTER — Encounter (HOSPITAL_COMMUNITY)
Admission: RE | Disposition: A | Discharge status: Home / Self Care | Payer: Self-pay | Source: Ambulatory Visit | Attending: Ophthalmology

## 2012-11-07 ENCOUNTER — Encounter: Payer: Self-pay | Admitting: Ophthalmology

## 2012-11-07 ENCOUNTER — Ambulatory Visit (HOSPITAL_COMMUNITY)
Admission: RE | Admit: 2012-11-07 | Discharge: 2012-11-07 | Disposition: A | Discharge status: Home / Self Care | Payer: Medicare Other | Source: Ambulatory Visit | Attending: Ophthalmology | Admitting: Ophthalmology

## 2012-11-07 ENCOUNTER — Other Ambulatory Visit: Payer: Self-pay | Admitting: Ophthalmology

## 2012-11-07 DIAGNOSIS — H409 Unspecified glaucoma: Secondary | ICD-10-CM | POA: Insufficient documentation

## 2012-11-07 DIAGNOSIS — H26499 Other secondary cataract, unspecified eye: Secondary | ICD-10-CM | POA: Insufficient documentation

## 2012-11-07 DIAGNOSIS — H4011X0 Primary open-angle glaucoma, stage unspecified: Secondary | ICD-10-CM | POA: Insufficient documentation

## 2012-11-07 DIAGNOSIS — Z79899 Other long term (current) drug therapy: Secondary | ICD-10-CM | POA: Insufficient documentation

## 2012-11-07 DIAGNOSIS — I519 Heart disease, unspecified: Secondary | ICD-10-CM | POA: Insufficient documentation

## 2012-11-07 DIAGNOSIS — Z7982 Long term (current) use of aspirin: Secondary | ICD-10-CM | POA: Insufficient documentation

## 2012-11-07 DIAGNOSIS — M3500 Sicca syndrome, unspecified: Secondary | ICD-10-CM | POA: Insufficient documentation

## 2012-11-07 DIAGNOSIS — I10 Essential (primary) hypertension: Secondary | ICD-10-CM | POA: Insufficient documentation

## 2012-11-07 HISTORY — PX: YAG LASER APPLICATION: SHX6189

## 2012-11-07 SURGERY — TREATMENT, USING YAG LASER
Anesthesia: LOCAL | Site: Eye | Laterality: Right

## 2012-11-07 MED ORDER — APRACLONIDINE HCL 1 % OP SOLN
1.0000 [drp] | Freq: Once | OPHTHALMIC | Status: AC
  Administered 2012-11-07: 1 [drp] via OPHTHALMIC

## 2012-11-07 MED ORDER — CYCLOPENTOLATE-PHENYLEPHRINE 0.2-1 % OP SOLN
OPHTHALMIC | Status: AC
  Administered 2012-11-07: 1 [drp] via OPHTHALMIC
  Filled 2012-11-07: qty 2

## 2012-11-07 MED ORDER — APRACLONIDINE HCL 0.5 % OP SOLN
OPHTHALMIC | Status: AC
  Administered 2012-11-07: 1 [drp] via OPHTHALMIC
  Filled 2012-11-07: qty 5

## 2012-11-07 MED ORDER — APRACLONIDINE HCL 1 % OP SOLN: 1.0000 [drp] | Freq: Once | OPHTHALMIC | Status: DC

## 2012-11-07 MED ORDER — CYCLOPENTOLATE-PHENYLEPHRINE 0.2-1 % OP SOLN
1.0000 [drp] | OPHTHALMIC | Status: AC
  Administered 2012-11-07: 1 [drp] via OPHTHALMIC

## 2012-11-07 MED ORDER — TETRACAINE HCL 0.5 % OP SOLN
OPHTHALMIC | Status: AC
  Filled 2012-11-07: qty 2

## 2012-11-07 NOTE — H&P (Signed)
  79 yo male co blurred vision right eye.  He has had cataract surgery both eyes.  He now has a cloudy posterior capsule right eye.  He is admitted for yag laser capsulotomy right eye. H & P submitted to medical records to be scanned into Epic.

## 2012-11-07 NOTE — Brief Op Note (Signed)
11/07/2012  7:22 AM  PATIENT:  [name].  79 y.o. male  PRE-OPERATIVE DIAGNOSIS:  OPACQUE POSTERIOR CAPSUL RIGHT EYE  POST-OPERATIVE DIAGNOSIS:  * No post-op diagnosis entered *  PROCEDURE:  Procedure(s): YAG LASER CAPSULOTOMY OF RIGHT EYE (Right)  SURGEON:  Surgeon(s) and Role:    Vita Erm., MD - Primary  PHYSICIAN ASSISTANT:   ASSISTANTS: none   ANESTHESIA:   none  EBL:     BLOOD ADMINISTERED:none  DRAINS: none   LOCAL MEDICATIONS USED:  NONE  SPECIMEN:  No Specimen  DISPOSITION OF SPECIMEN:  N/A  COUNTS:  YES  TOURNIQUET:  * No tourniquets in log *  DICTATION: .Other Dictation: Dictation Number [id number]  PLAN OF CARE: Discharge to home after PACU  PATIENT DISPOSITION:  Short Stay   Delay start of Pharmacological VTE agent (>24hrs) due to surgical blood loss or risk of bleeding: not applicable

## 2012-11-10 ENCOUNTER — Encounter (HOSPITAL_COMMUNITY): Payer: Self-pay | Admitting: Ophthalmology

## 2012-11-10 NOTE — Op Note (Signed)
[name], VALE NO.:  [id number]  MEDICAL RECORD NO.:  [id number]  LOCATION:  [facility]                         FACILITY:  [facility]  PHYSICIAN:  Salley Scarlet., M.D.DATE OF BIRTH:  04-Nov-1918  DATE OF PROCEDURE: DATE OF DISCHARGE:  11/07/2012                              OPERATIVE REPORT   PREOPERATIVE DIAGNOSIS:  Opaque posterior capsule, right eye.  POSTOPERATIVE DIAGNOSIS:  Opaque posterior capsule, right eye.  OPERATION:  YAG laser capsulotomy, right eye.  JUSTIFICATION FOR PROCEDURE:  This is a 79 year old gentleman, who underwent a cataract extraction, right eye several years ago.  He has recently been complaining of blurring of vision.  He was evaluated and found to have a cloudy posterior capsule.  YAG laser was recommended and he is admitted at this time for that purpose.  OPERATIVE PROCEDURE:  The patient was brought to the laser room and positioned appropriately behind the YAG laser.  Approximately 40 applications of laser energy applied to the posterior capsule, obtaining a satisfactory opening.  The patient tolerated the procedure well, was discharged to the post anesthesia recovery room in satisfactory condition with instructions to see me this afternoon for evaluation and intraocular pressure.  DISCHARGE DIAGNOSIS:  Opaque posterior capsule, right eye.     Salley Scarlet., M.D.     TB/MEDQ  D:  11/10/2012  T:  11/10/2012  Job:  [id number]

## 2012-11-21 ENCOUNTER — Encounter (HOSPITAL_COMMUNITY): Payer: Self-pay | Admitting: Adult Health

## 2012-11-21 ENCOUNTER — Emergency Department (HOSPITAL_COMMUNITY)
Admission: EM | Admit: 2012-11-21 | Discharge: 2012-11-22 | Disposition: A | Discharge status: Home / Self Care | Payer: Medicare Other | Attending: Emergency Medicine | Admitting: Emergency Medicine

## 2012-11-21 ENCOUNTER — Emergency Department (HOSPITAL_COMMUNITY): Payer: Medicare Other

## 2012-11-21 DIAGNOSIS — I509 Heart failure, unspecified: Secondary | ICD-10-CM | POA: Insufficient documentation

## 2012-11-21 DIAGNOSIS — D649 Anemia, unspecified: Secondary | ICD-10-CM | POA: Insufficient documentation

## 2012-11-21 DIAGNOSIS — I2510 Atherosclerotic heart disease of native coronary artery without angina pectoris: Secondary | ICD-10-CM | POA: Insufficient documentation

## 2012-11-21 DIAGNOSIS — M129 Arthropathy, unspecified: Secondary | ICD-10-CM | POA: Insufficient documentation

## 2012-11-21 DIAGNOSIS — R739 Hyperglycemia, unspecified: Secondary | ICD-10-CM

## 2012-11-21 DIAGNOSIS — Z79899 Other long term (current) drug therapy: Secondary | ICD-10-CM | POA: Insufficient documentation

## 2012-11-21 DIAGNOSIS — Z8679 Personal history of other diseases of the circulatory system: Secondary | ICD-10-CM | POA: Insufficient documentation

## 2012-11-21 DIAGNOSIS — E1169 Type 2 diabetes mellitus with other specified complication: Secondary | ICD-10-CM | POA: Insufficient documentation

## 2012-11-21 DIAGNOSIS — Z8701 Personal history of pneumonia (recurrent): Secondary | ICD-10-CM | POA: Insufficient documentation

## 2012-11-21 DIAGNOSIS — K5900 Constipation, unspecified: Secondary | ICD-10-CM | POA: Insufficient documentation

## 2012-11-21 DIAGNOSIS — Z950 Presence of cardiac pacemaker: Secondary | ICD-10-CM | POA: Insufficient documentation

## 2012-11-21 DIAGNOSIS — I10 Essential (primary) hypertension: Secondary | ICD-10-CM | POA: Insufficient documentation

## 2012-11-21 DIAGNOSIS — Z8739 Personal history of other diseases of the musculoskeletal system and connective tissue: Secondary | ICD-10-CM | POA: Insufficient documentation

## 2012-11-21 DIAGNOSIS — Z794 Long term (current) use of insulin: Secondary | ICD-10-CM | POA: Insufficient documentation

## 2012-11-21 LAB — BASIC METABOLIC PANEL
CO2: 34 mEq/L — ABNORMAL HIGH (ref 19–32)
Chloride: 93 mEq/L — ABNORMAL LOW (ref 96–112)
Glucose, Bld: 262 mg/dL — ABNORMAL HIGH (ref 70–99)
Potassium: 4.5 mEq/L (ref 3.5–5.1)
Sodium: 135 mEq/L (ref 135–145)

## 2012-11-21 LAB — CBC
Hemoglobin: 11.9 g/dL — ABNORMAL LOW (ref 13.0–17.0)
RBC: 3.71 MIL/uL — ABNORMAL LOW (ref 4.22–5.81)

## 2012-11-21 LAB — POCT I-STAT TROPONIN I: Troponin i, poc: 0.15 ng/mL (ref 0.00–0.08)

## 2012-11-21 LAB — PRO B NATRIURETIC PEPTIDE: Pro B Natriuretic peptide (BNP): 1134 pg/mL — ABNORMAL HIGH (ref 0–450)

## 2012-11-21 MED ORDER — ENOXAPARIN SODIUM 150 MG/ML ~~LOC~~ SOLN: 1.0000 mg/kg | Freq: Once | SUBCUTANEOUS | Status: DC

## 2012-11-21 MED ORDER — SODIUM CHLORIDE 0.9 % IV BOLUS (SEPSIS): 500.0000 mL | Freq: Once | INTRAVENOUS | Status: DC

## 2012-11-21 MED ORDER — ASPIRIN 325 MG PO TABS
325.0000 mg | ORAL_TABLET | Freq: Once | ORAL | Status: AC
  Administered 2012-11-21: 325 mg via ORAL
  Filled 2012-11-21: qty 1

## 2012-11-21 NOTE — ED Notes (Signed)
Presents with high blood sugar and inability to get blood sugar down today. CBG 210. Pt reports SOB and dizziness at times, but denies now. He is alert and oriented.  "for the last 2 weeks I have just felt dehydrated"

## 2012-11-21 NOTE — ED Notes (Signed)
Patient states that he came to the ED because "my blood pressure is higher than normal and my blood sugar did not go down after I took my lantus." Patient states "I just wanted to come make sure everything is okay." Pt denies pain, denies any shortness of breath.

## 2012-11-21 NOTE — ED Provider Notes (Signed)
History    CSN: [id number] Arrival date & time 11/21/12  2029  First MD Initiated Contact with Patient 11/21/12 2131     Chief Complaint  Patient presents with  . Hyperglycemia   (Consider location/radiation/quality/duration/timing/severity/associated sxs/prior Treatment) HPI Comments: Pt w/ multiple co morbidities significant for DM now w/ poorly controlled hyperglycemia x 2 days. Has been compliant w/ lantus and humalog and has increased dose based on elevated glucose however glucose persistently runs in upper 200s.  Denies poor diet. No polyuria/polydypsia.  Denies fever, abd pain, dyspnea or cough, no chest pain or DOE, no HA, blurred vision or diplopia. No change in baseline urinary hesitancy. Admits to intermitted constipation controlled w/ laxative. Denies dysuria/polyuria/hematuria.   Patient is a 79 y.o. male presenting with general illness. The history is provided by the patient. No language interpreter was used.  Illness Location:  Endo Quality:  Hyperglycemia Severity:  Moderate Onset quality:  Gradual Duration:  2 days Timing:  Constant Progression:  Worsening Chronicity:  Recurrent Associated symptoms: no abdominal pain, no chest pain, no congestion, no cough, no diarrhea, no fever, no headaches, no nausea, no rash, no shortness of breath, no sore throat and no vomiting    Past Medical History  Diagnosis Date  . CHF (congestive heart failure)   . Diabetes mellitus   . Hypertension   . Hyperlipidemia   . Coronary artery disease   . Spinal stenosis   . Dysrhythmia   . Pneumonia   . Pacemaker   . Arthritis   . Anemia    Past Surgical History  Procedure Laterality Date  . Lumbar laminectomy    . Back surgery    . Total knee arthroplasty    . Quadriceps tendon repair    . Cataract extraction    . Insert / replace / remove pacemaker    . Carotid endarterectomy    . Yag laser application Right 11/07/2012    Procedure: YAG LASER CAPSULOTOMY OF RIGHT EYE;   Surgeon: Vita Erm., MD;  Location: [facility] OR;  Service: Ophthalmology;  Laterality: Right;   Family History  Problem Relation Age of Onset  . Multiple myeloma Father   . Hypertension Mother    History  Substance Use Topics  . Smoking status: Never Smoker   . Smokeless tobacco: Never Used  . Alcohol Use: Yes     Comment: Cocktails occasionally    Review of Systems  Constitutional: Negative for fever and chills.  HENT: Negative for congestion and sore throat.   Respiratory: Negative for cough and shortness of breath.   Cardiovascular: Negative for chest pain and leg swelling.  Gastrointestinal: Negative for nausea, vomiting, abdominal pain, diarrhea and constipation.  Genitourinary: Negative for dysuria and frequency.  Skin: Negative for color change and rash.  Neurological: Negative for dizziness and headaches.  Psychiatric/Behavioral: Negative for confusion and agitation.  All other systems reviewed and are negative.    Allergies  Statins  Home Medications   Current Outpatient Rx  Name  Route  Sig  Dispense  Refill  . apixaban (ELIQUIS) 2.5 MG TABS tablet   Oral   Take 1 tablet (2.5 mg total) by mouth 2 (two) times daily.   60 tablet   1   . dutasteride (AVODART) 0.5 MG capsule   Oral   Take 0.5 mg by mouth daily.         . ferrous sulfate 325 (65 FE) MG tablet   Oral   Take 325 mg by  mouth daily with breakfast.         . HYDROcodone-acetaminophen (NORCO) 7.5-325 MG per tablet   Oral   Take 1 tablet by mouth every 6 (six) hours as needed for pain.   100 tablet   5   . insulin glargine (LANTUS) 100 UNIT/ML injection   Subcutaneous   Inject 14 Units into the skin at bedtime.          . insulin lispro (HUMALOG) 100 UNIT/ML injection   Subcutaneous   Inject 7-9 Units into the skin 3 (three) times daily before meals. 7 units with breakfast and lunch, 9 units after supper         . isosorbide mononitrate (IMDUR) 60 MG 24 hr tablet   Oral    Take 1 tablet (60 mg total) by mouth daily.   30 tablet   1   . losartan (COZAAR) 100 MG tablet   Oral   Take 100 mg by mouth daily.         . metoprolol tartrate (LOPRESSOR) 25 MG tablet   Oral   Take 1 tablet (25 mg total) by mouth 2 (two) times daily.   60 tablet   1   . Multiple Vitamin (MULTIVITAMIN WITH MINERALS) TABS   Oral   Take 1 tablet by mouth daily. Diabetic pak by Ashby Dawes from [employer]         . nitroGLYCERIN (NITROSTAT) 0.4 MG SL tablet   Sublingual   Place 0.4 mg under the tongue every 5 (five) minutes as needed for chest pain.         Bertram Gala Glycol-Propyl Glycol (SYSTANE OP)   Both Eyes   Place 1 drop into both eyes 2 (two) times daily as needed. For dry eyes         . polyethylene glycol (MIRALAX / GLYCOLAX) packet   Oral   Take 17 g by mouth daily as needed. For constipation         . potassium chloride SA (K-DUR,KLOR-CON) 20 MEQ tablet   Oral   Take 20 mEq by mouth daily.         . silodosin (RAPAFLO) 8 MG CAPS capsule   Oral   Take 8 mg by mouth daily with breakfast.         . Tafluprost (ZIOPTAN) 0.0015 % SOLN   Both Eyes   Place 1 drop into both eyes every other day. ONLY PRESERVATIVE FREE         . torsemide (DEMADEX) 20 MG tablet   Oral   Take 3 tablets (60 mg total) by mouth 2 (two) times daily.   180 tablet   1    BP 160/80  Pulse 63  Temp(Src) 97.8 F (36.6 C) (Oral)  Resp 16  SpO2 96% Physical Exam  Constitutional: He is oriented to person, place, and time. He appears well-developed and well-nourished. No distress.  HENT:  Head: Normocephalic and atraumatic.  Eyes: EOM are normal. Pupils are equal, round, and reactive to light.  Neck: Normal range of motion. Neck supple.  Cardiovascular: Normal rate and regular rhythm.   Pulmonary/Chest: Effort normal and breath sounds normal. No respiratory distress.  Abdominal: Soft. He exhibits no distension. There is no tenderness.  Musculoskeletal: Normal range of  motion. He exhibits no edema.  Neurological: He is alert and oriented to person, place, and time.  Skin: Skin is warm and dry.  Psychiatric: He has a normal mood and affect. His behavior is normal.  ED Course  Procedures (including critical care time) Results for orders placed during the hospital encounter of 11/21/12  CBC      Result Value Range   WBC 6.7  4.0 - 10.5 K/uL   RBC 3.71 (*) 4.22 - 5.81 MIL/uL   Hemoglobin 11.9 (*) 13.0 - 17.0 g/dL   HCT 16.1 (*) 09.6 - 04.5 %   MCV 94.6  78.0 - 100.0 fL   MCH 32.1  26.0 - 34.0 pg   MCHC 33.9  30.0 - 36.0 g/dL   RDW 40.9  81.1 - 91.4 %   Platelets 229  150 - 400 K/uL  BASIC METABOLIC PANEL      Result Value Range   Sodium 135  135 - 145 mEq/L   Potassium 4.5  3.5 - 5.1 mEq/L   Chloride 93 (*) 96 - 112 mEq/L   CO2 34 (*) 19 - 32 mEq/L   Glucose, Bld 262 (*) 70 - 99 mg/dL   BUN 35 (*) 6 - 23 mg/dL   Creatinine, Ser 7.82 (*) 0.50 - 1.35 mg/dL   Calcium 9.6  8.4 - 95.6 mg/dL   GFR calc non Af Amer 35 (*) >90 mL/min   GFR calc Af Amer 41 (*) >90 mL/min  GLUCOSE, CAPILLARY      Result Value Range   Glucose-Capillary 210 (*) 70 - 99 mg/dL  URINALYSIS, ROUTINE W REFLEX MICROSCOPIC      Result Value Range   Color, Urine YELLOW  YELLOW   APPearance CLOUDY (*) CLEAR   Specific Gravity, Urine 1.013  1.005 - 1.030   pH 5.5  5.0 - 8.0   Glucose, UA NEGATIVE  NEGATIVE mg/dL   Hgb urine dipstick NEGATIVE  NEGATIVE   Bilirubin Urine NEGATIVE  NEGATIVE   Ketones, ur NEGATIVE  NEGATIVE mg/dL   Protein, ur NEGATIVE  NEGATIVE mg/dL   Urobilinogen, UA 0.2  0.0 - 1.0 mg/dL   Nitrite NEGATIVE  NEGATIVE   Leukocytes, UA NEGATIVE  NEGATIVE  PRO B NATRIURETIC PEPTIDE      Result Value Range   Pro B Natriuretic peptide (BNP) 1134.0 (*) 0 - 450 pg/mL  GLUCOSE, CAPILLARY      Result Value Range   Glucose-Capillary 210 (*) 70 - 99 mg/dL   Comment 1 Notify RN    POCT I-STAT TROPONIN I      Result Value Range   Troponin i, poc 0.15 (*) 0.00 -  0.08 ng/mL   Comment NOTIFIED PHYSICIAN     Comment 3           POCT I-STAT TROPONIN I      Result Value Range   Troponin i, poc 0.11 (*) 0.00 - 0.08 ng/mL   Comment NOTIFIED PHYSICIAN     Comment 3            DG Chest 2 View (Final result)  Result time: 11/21/12 22:57:10    Final result by Rad Results In Interface (11/21/12 22:57:10)    Narrative:   *RADIOLOGY REPORT*  Clinical Data: Weakness and dizziness.  CHEST - 2 VIEW  Comparison: 09/14/2012.  Findings: The cardiac silhouette remains borderline enlarged. Small amount of patchy density at the medial right lung base and linear density at the medial left lung base. Diffuse peribronchial thickening. Stable left subclavian pacemaker leads and mild scoliosis.  IMPRESSION:  1. Small amount of patchy atelectasis or pneumonia at the right lung base. 2. Small amount of linear atelectasis in the left lower lobe with  improvement. 3. Progressive bronchitic changes.   Original Report Authenticated By: Beckie Salts, M.D.         Date: 11/22/2012  Rate: 71  Rhythm: normal sinus rhythm  QRS Axis: normal  Intervals: normal  ST/T Wave abnormalities: TWI inferior/lateral leads  Conduction Disutrbances:first-degree A-V block  and nonspecific intraventricular conduction delay  Narrative Interpretation:   Old EKG Reviewed: unchanged   No results found. No diagnosis found.  MDM  Exam as above, NAD, vital unremarkable, lungs clear, abd soft and benign. CXR - NACPF - no infiltrate - doubt PNA, glucose 262 - HCO3 34 - doubt DKA, u/a neg for infection or ketones. BUN/Cr at baseline - no AKI, BNP 1134 - below baseline. ECG w/ TWI in inferior and lateral leads, troponin 0.15 - cardiology consulted - d/w Dr Gala Romney - old ECG reviewed - no new acute ischemia - pt has LBBB w/ prior noted TWI in inferior/lateral leads. Repeat 3hr troponin 0.11. At this time doubt acute ischemia or ACS. Repeat glucose 210. No source of pts hyperglycemia  - no acute infection identified. Doubt meningitis based on exam. Doubt ACS or DKA. Stable for d/c home. Recommend close fup w/ pcp on Monday. Encourage PO hydration and healthy diet. Given return precautions. D/c in good and stable condition.   I have personally reviewed labs and imaging and considered in my MDM. Case d/w Dr Patria Mane  1. Hyperglycemia    [name], MD [address] [place] [address] [phone]  Schedule an appointment as soon as possible for a visit on 11/24/2012      Audelia Hives, MD 11/22/12 [phone]

## 2012-11-21 NOTE — ED Notes (Signed)
Per family pt was given Novolog 13 units at 1812 and Lantus 16 units at 1900

## 2012-11-22 LAB — URINALYSIS, ROUTINE W REFLEX MICROSCOPIC
Leukocytes, UA: NEGATIVE
Protein, ur: NEGATIVE mg/dL
Urobilinogen, UA: 0.2 mg/dL (ref 0.0–1.0)

## 2012-11-22 LAB — POCT I-STAT TROPONIN I: Troponin i, poc: 0.11 ng/mL (ref 0.00–0.08)

## 2012-11-22 MED ORDER — CEFTRIAXONE SODIUM 1 G IJ SOLR: 1.0000 g | Freq: Once | INTRAMUSCULAR | Status: DC

## 2012-11-22 NOTE — ED Provider Notes (Signed)
I saw and evaluated the patient, reviewed the resident's note and I agree with the findings and plan. I personally evaluated the ECG and agree with the interpretation of the resident  Overall well-appearing.  Hyperglycemia without DKA.  We discussed the case with cardiology regarding his abnormal EKG.  He's had inferior lateral T wave inversions before in the past.  Serial troponins and not changing.  He has a history of chronically elevated troponin.  This is not a presentation of ACS or heart failure.  Overall very well appearing.  Discharge home in good condition.  Lyanne Co, MD 11/22/12 Ernestina Columbia

## 2012-11-25 ENCOUNTER — Telehealth: Payer: Self-pay | Admitting: *Deleted

## 2012-11-25 NOTE — Telephone Encounter (Signed)
Spoke with Dr. Chestine Spore and scheduled patient on 12/03/12 at 9:30 AM.

## 2012-12-01 ENCOUNTER — Emergency Department (HOSPITAL_COMMUNITY)
Admission: EM | Admit: 2012-12-01 | Discharge: 2012-12-01 | Disposition: A | Discharge status: Home / Self Care | Payer: Medicare Other | Attending: Emergency Medicine | Admitting: Emergency Medicine

## 2012-12-01 ENCOUNTER — Encounter (HOSPITAL_COMMUNITY): Payer: Self-pay | Admitting: Emergency Medicine

## 2012-12-01 ENCOUNTER — Emergency Department (HOSPITAL_COMMUNITY): Payer: Medicare Other

## 2012-12-01 DIAGNOSIS — Z950 Presence of cardiac pacemaker: Secondary | ICD-10-CM | POA: Insufficient documentation

## 2012-12-01 DIAGNOSIS — D649 Anemia, unspecified: Secondary | ICD-10-CM | POA: Insufficient documentation

## 2012-12-01 DIAGNOSIS — M129 Arthropathy, unspecified: Secondary | ICD-10-CM | POA: Insufficient documentation

## 2012-12-01 DIAGNOSIS — R0981 Nasal congestion: Secondary | ICD-10-CM

## 2012-12-01 DIAGNOSIS — J3489 Other specified disorders of nose and nasal sinuses: Secondary | ICD-10-CM | POA: Insufficient documentation

## 2012-12-01 DIAGNOSIS — I5022 Chronic systolic (congestive) heart failure: Secondary | ICD-10-CM

## 2012-12-01 DIAGNOSIS — Z79899 Other long term (current) drug therapy: Secondary | ICD-10-CM | POA: Insufficient documentation

## 2012-12-01 DIAGNOSIS — I499 Cardiac arrhythmia, unspecified: Secondary | ICD-10-CM | POA: Insufficient documentation

## 2012-12-01 DIAGNOSIS — E785 Hyperlipidemia, unspecified: Secondary | ICD-10-CM | POA: Insufficient documentation

## 2012-12-01 DIAGNOSIS — I2510 Atherosclerotic heart disease of native coronary artery without angina pectoris: Secondary | ICD-10-CM | POA: Insufficient documentation

## 2012-12-01 DIAGNOSIS — E119 Type 2 diabetes mellitus without complications: Secondary | ICD-10-CM | POA: Insufficient documentation

## 2012-12-01 DIAGNOSIS — I129 Hypertensive chronic kidney disease with stage 1 through stage 4 chronic kidney disease, or unspecified chronic kidney disease: Secondary | ICD-10-CM | POA: Insufficient documentation

## 2012-12-01 DIAGNOSIS — Z794 Long term (current) use of insulin: Secondary | ICD-10-CM | POA: Insufficient documentation

## 2012-12-01 DIAGNOSIS — Z8701 Personal history of pneumonia (recurrent): Secondary | ICD-10-CM | POA: Insufficient documentation

## 2012-12-01 DIAGNOSIS — R609 Edema, unspecified: Secondary | ICD-10-CM | POA: Insufficient documentation

## 2012-12-01 DIAGNOSIS — N289 Disorder of kidney and ureter, unspecified: Secondary | ICD-10-CM | POA: Insufficient documentation

## 2012-12-01 DIAGNOSIS — M4800 Spinal stenosis, site unspecified: Secondary | ICD-10-CM | POA: Insufficient documentation

## 2012-12-01 DIAGNOSIS — Z888 Allergy status to other drugs, medicaments and biological substances status: Secondary | ICD-10-CM | POA: Insufficient documentation

## 2012-12-01 LAB — BASIC METABOLIC PANEL
BUN: 27 mg/dL — ABNORMAL HIGH (ref 6–23)
CO2: 32 mEq/L (ref 19–32)
Calcium: 9.7 mg/dL (ref 8.4–10.5)
Creatinine, Ser: 1.66 mg/dL — ABNORMAL HIGH (ref 0.50–1.35)
Glucose, Bld: 177 mg/dL — ABNORMAL HIGH (ref 70–99)

## 2012-12-01 LAB — CBC
HCT: 35.3 % — ABNORMAL LOW (ref 39.0–52.0)
Hemoglobin: 12.2 g/dL — ABNORMAL LOW (ref 13.0–17.0)
MCH: 32.3 pg (ref 26.0–34.0)
MCV: 93.4 fL (ref 78.0–100.0)
RBC: 3.78 MIL/uL — ABNORMAL LOW (ref 4.22–5.81)

## 2012-12-01 LAB — POCT I-STAT TROPONIN I: Troponin i, poc: 0.25 ng/mL (ref 0.00–0.08)

## 2012-12-01 LAB — TROPONIN I: Troponin I: <0.3 ng/mL (ref <0.30)

## 2012-12-01 MED ORDER — ASPIRIN 81 MG PO CHEW
324.0000 mg | CHEWABLE_TABLET | Freq: Once | ORAL | Status: AC
  Administered 2012-12-01: 324 mg via ORAL
  Filled 2012-12-01: qty 4

## 2012-12-01 NOTE — ED Provider Notes (Signed)
History    CSN: [id number] Arrival date & time 12/01/12  0317  First MD Initiated Contact with Patient 12/01/12 [phone]     Chief Complaint  Patient presents with  . Chest Pain   (Consider location/radiation/quality/duration/timing/severity/associated sxs/prior Treatment) Patient is a 79 y.o. male presenting with chest pain. The history is provided by the patient.  Chest Pain He had onset this evening of nasal congestion. When he cleared his nose and tried to swallow, he had a vague discomfort in his chest. He does note that he feels worse when he is supine and better when he is sitting. There is no actual chest pain, heaviness, tightness, pressure. He denies any dyspnea. He has noted some increase in his baseline pedal edema. He denies nausea, vomiting, diaphoresis. Past Medical History  Diagnosis Date  . CHF (congestive heart failure)   . Diabetes mellitus   . Hypertension   . Hyperlipidemia   . Coronary artery disease   . Spinal stenosis   . Dysrhythmia   . Pneumonia   . Pacemaker   . Arthritis   . Anemia    Past Surgical History  Procedure Laterality Date  . Lumbar laminectomy    . Back surgery    . Total knee arthroplasty    . Quadriceps tendon repair    . Cataract extraction    . Insert / replace / remove pacemaker    . Carotid endarterectomy    . Yag laser application Right 11/07/2012    Procedure: YAG LASER CAPSULOTOMY OF RIGHT EYE;  Surgeon: Vita Erm., MD;  Location: [facility] OR;  Service: Ophthalmology;  Laterality: Right;   Family History  Problem Relation Age of Onset  . Multiple myeloma Father   . Hypertension Mother    History  Substance Use Topics  . Smoking status: Never Smoker   . Smokeless tobacco: Never Used  . Alcohol Use: Yes     Comment: Cocktails occasionally    Review of Systems  Cardiovascular: Positive for chest pain.  Gastrointestinal:       Constipation  All other systems reviewed and are negative.    Allergies   Statins  Home Medications   Current Outpatient Rx  Name  Route  Sig  Dispense  Refill  . allopurinol (ZYLOPRIM) 100 MG tablet   Oral   Take 100 mg by mouth at bedtime.         [name] apixaban (ELIQUIS) 2.5 MG TABS tablet   Oral   Take 1 tablet (2.5 mg total) by mouth 2 (two) times daily.   60 tablet   1   . dutasteride (AVODART) 0.5 MG capsule   Oral   Take 0.5 mg by mouth every morning.          . ferrous sulfate 325 (65 FE) MG tablet   Oral   Take 325 mg by mouth daily with breakfast.         . insulin glargine (LANTUS) 100 UNIT/ML injection   Subcutaneous   Inject 14-16 Units into the skin at bedtime.          . insulin lispro (HUMALOG) 100 UNIT/ML injection   Subcutaneous   Inject 9-10 Units into the skin 3 (three) times daily before meals. 9 units with breakfast and lunch & 10 units after supper         . isosorbide mononitrate (IMDUR) 60 MG 24 hr tablet   Oral   Take 1 tablet (60 mg total) by mouth daily.  30 tablet   1   . losartan (COZAAR) 100 MG tablet   Oral   Take 100 mg by mouth daily.         . metoprolol tartrate (LOPRESSOR) 25 MG tablet   Oral   Take 25 mg by mouth daily.         . Multiple Vitamin (MULTIVITAMIN WITH MINERALS) TABS   Oral   Take 1 tablet by mouth daily. Diabetic pak by Ashby Dawes from [employer]         . nitroGLYCERIN (NITROSTAT) 0.4 MG SL tablet   Sublingual   Place 0.4 mg under the tongue every 5 (five) minutes as needed for chest pain.         . polyethylene glycol (MIRALAX / GLYCOLAX) packet   Oral   Take 17 g by mouth daily as needed. For constipation         . Polyvinyl Alcohol-Povidone (REFRESH OP)   Both Eyes   Place 1 drop into both eyes 2 (two) times daily. Refresh Gel Opth         . potassium chloride SA (K-DUR,KLOR-CON) 20 MEQ tablet   Oral   Take 20 mEq by mouth daily.         . silodosin (RAPAFLO) 8 MG CAPS capsule   Oral   Take 8 mg by mouth daily with breakfast.         . Tafluprost  (ZIOPTAN) 0.0015 % SOLN   Both Eyes   Place 1 drop into both eyes every other day. Use at night         . torsemide (DEMADEX) 20 MG tablet   Oral   Take 3 tablets (60 mg total) by mouth 2 (two) times daily.   180 tablet   1    BP 150/73  Pulse 64  Temp(Src) 98 F (36.7 C) (Oral)  Resp 18  SpO2 97% Physical Exam  Nursing note and vitals reviewed.  79 year old male, resting comfortably and in no acute distress. Vital signs are significant for mild hypertension with blood pressure 150/73. Oxygen saturation is 97%, which is normal. Head is normocephalic and atraumatic. PERRLA, EOMI. Oropharynx is clear. Neck is nontender and supple without adenopathy or JVD. Back is nontender and there is no CVA tenderness. Lungs have scattered bibasilar rales without wheezes or rhonchi. Chest is nontender. Heart has regular rate and rhythm without murmur. Abdomen is soft, flat, nontender without masses or hepatosplenomegaly and peristalsis is normoactive. Extremities have trace edema, full range of motion is present. Skin is warm and dry without rash. Neurologic: Mental status is normal, cranial nerves are intact, there are no motor or sensory deficits.  ED Course  Procedures (including critical care time) Results for orders placed during the hospital encounter of 12/01/12  CBC      Result Value Range   WBC 6.1  4.0 - 10.5 K/uL   RBC 3.78 (*) 4.22 - 5.81 MIL/uL   Hemoglobin 12.2 (*) 13.0 - 17.0 g/dL   HCT 78.4 (*) 69.6 - 29.5 %   MCV 93.4  78.0 - 100.0 fL   MCH 32.3  26.0 - 34.0 pg   MCHC 34.6  30.0 - 36.0 g/dL   RDW 28.4  13.2 - 44.0 %   Platelets 181  150 - 400 K/uL  BASIC METABOLIC PANEL      Result Value Range   Sodium 138  135 - 145 mEq/L   Potassium 3.9  3.5 - 5.1 mEq/L  Chloride 97  96 - 112 mEq/L   CO2 32  19 - 32 mEq/L   Glucose, Bld 177 (*) 70 - 99 mg/dL   BUN 27 (*) 6 - 23 mg/dL   Creatinine, Ser 1.61 (*) 0.50 - 1.35 mg/dL   Calcium 9.7  8.4 - 09.6 mg/dL   GFR calc  non Af Amer 34 (*) >90 mL/min   GFR calc Af Amer 40 (*) >90 mL/min  PRO B NATRIURETIC PEPTIDE      Result Value Range   Pro B Natriuretic peptide (BNP) 1354.0 (*) 0 - 450 pg/mL  TROPONIN I      Result Value Range   Troponin I <0.30  <0.30 ng/mL  POCT I-STAT TROPONIN I      Result Value Range   Troponin i, poc 0.25 (*) 0.00 - 0.08 ng/mL   Comment NOTIFIED PHYSICIAN     Comment 3            Dg Chest 2 View  11/21/2012   *RADIOLOGY REPORT*  Clinical Data: Weakness and dizziness.  CHEST - 2 VIEW  Comparison: 09/14/2012.  Findings: The cardiac silhouette remains borderline enlarged. Small amount of patchy density at the medial right lung base and linear density at the medial left lung base.  Diffuse peribronchial thickening.  Stable left subclavian pacemaker leads and mild scoliosis.  IMPRESSION:  1.  Small amount of patchy atelectasis or pneumonia at the right lung base. 2.  Small amount of linear atelectasis in the left lower lobe with improvement. 3.  Progressive bronchitic changes.   Original Report Authenticated By: Beckie Salts, M.D.   Dg Chest Port 1 View  12/01/2012   *RADIOLOGY REPORT*  Clinical Data: Shortness of breath  PORTABLE CHEST - 1 VIEW  Comparison: Prior radiograph from 11/22/1938  Findings: Left-sided dual lead transvenous / AICD with electrodes overlying the right atrium and right ventricle are unchanged. Cardiomegaly is stable.  Mediastinal contours are within normal limits.  The lungs are well inflated.  Patchy and linear opacities within both lung bases likely reflect atelectasis, and are similar to slightly improved as compared to the prior exam.  There is no pulmonary edema. Peribronchial thickening is improved as compared to the prior study.  No pleural effusion.  No pneumothorax.  Osseous structures and soft tissues are unchanged. Degenerative changes are noted about both shoulders bilaterally.  IMPRESSION:  Bibasilar atelectasis without focal infiltrate or pulmonary edema.    Original Report Authenticated By: Rise Mu, M.D.      Date: 12/01/2012  Rate: 65  Rhythm: AV sequential pacemaker  QRS Axis: left  Intervals: normal  ST/T Wave abnormalities: normal  Conduction Disutrbances:none  Narrative Interpretation: AV sequential pacemaker. When compared with ECG of 09/14/2012, no significant changes are seen.  Old EKG Reviewed: unchanged   1. Nasal congestion   2. Renal insufficiency   3. CHF (congestive heart failure), chronic, systolic     MDM  Date respiratory and chest symptoms of uncertain cause. Old records reviewed and he has been admitted for CHF in the past. Screening labs and chest x-ray have been ordered. Is given a dose of aspirin.  Workup is unremarkable. Point-of-care troponin was mildly elevated but main lab troponin is normal. BNP is at his baseline. He states that he was feeling much better and has appointments with his cardiologist later this week. He is discharged with instructions to followup with his cardiologist.  Dione Booze, MD 12/01/12 [phone]

## 2012-12-01 NOTE — ED Notes (Signed)
Family states pt is here for sob.  Pt reports that he woke up just pta with difficulty swallowing and discomfort in the center of chest.  Pt denies, sob, nausea, or vomiting.

## 2012-12-01 NOTE — ED Notes (Signed)
Family at bedside. 

## 2012-12-03 ENCOUNTER — Encounter: Payer: Self-pay | Admitting: Gastroenterology

## 2012-12-03 ENCOUNTER — Ambulatory Visit (INDEPENDENT_AMBULATORY_CARE_PROVIDER_SITE_OTHER): Payer: Medicare Other | Admitting: Gastroenterology

## 2012-12-03 VITALS — BP 122/68 | HR 72 | Ht 67.0 in | Wt 198.8 lb

## 2012-12-03 DIAGNOSIS — K5900 Constipation, unspecified: Secondary | ICD-10-CM

## 2012-12-03 NOTE — Patient Instructions (Addendum)
Take Linzess 290 samples Call the office and speak with Dr Arlyce Dice if this does not work for you

## 2012-12-03 NOTE — Assessment & Plan Note (Addendum)
Patient has chronic idiopathic constipation.  Lower dose Linzess was ineffective. Amitiza 24 mcg daily causes loose watery stools.  Recommendations #1 trial of Linzess 290 mg daily; if that is ineffective will try adding lactulose

## 2012-12-03 NOTE — Progress Notes (Signed)
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 This is been a problem for over 30 years. He has taken various stimulants that eventually lose their effectiveness. If he does not have a daily bowel movement he has trouble urinating and retains water. Low-dose Linzess  was ineffective. Hematochezia 16 mcg twice a day was ineffective. At 24 mcg daily he had a daily bowel movement that was followed by several loose watery stools. He denies abdominal or rectal pain. There is no history of hematochezia.    Past Medical History  Diagnosis Date  . CHF (congestive heart failure)   . Diabetes mellitus   . Hypertension   . Hyperlipidemia   . Coronary artery disease   . Spinal stenosis   . Dysrhythmia   . Pneumonia   . Pacemaker   . Arthritis   . Anemia    Past Surgical History  Procedure Laterality Date  . Lumbar laminectomy    . Back surgery    . Total knee arthroplasty    . Quadriceps tendon repair    . Cataract extraction    . Insert / replace / remove pacemaker    . Carotid endarterectomy    . Yag laser application Right 11/07/2012    Procedure: YAG LASER CAPSULOTOMY OF RIGHT EYE;  Surgeon: Vita Erm., MD;  Location: [facility] OR;  Service: Ophthalmology;  Laterality: Right;  . Tonsillectomy     family history includes Hypertension in his mother and Multiple myeloma in his father. Current Outpatient Prescriptions  Medication Sig Dispense Refill  . allopurinol (ZYLOPRIM) 100 MG tablet Take 100 mg by mouth at bedtime.      [name] apixaban (ELIQUIS) 2.5 MG TABS tablet Take 1 tablet (2.5 mg total) by mouth 2 (two) times daily.  60 tablet  1  . dutasteride (AVODART) 0.5 MG capsule Take 0.5 mg by mouth every morning.       . ferrous sulfate 325 (65 FE) MG tablet Take 325 mg by mouth daily with breakfast.      . insulin glargine (LANTUS) 100 UNIT/ML injection Inject 15 Units into the skin at bedtime.       . insulin lispro (HUMALOG) 100  UNIT/ML injection Inject 9-10 Units into the skin 3 (three) times daily before meals. 9 units with breakfast and lunch & 10 units after supper      . isosorbide mononitrate (IMDUR) 60 MG 24 hr tablet Take 1 tablet (60 mg total) by mouth daily.  30 tablet  1  . losartan (COZAAR) 100 MG tablet Take 100 mg by mouth daily.      . metoprolol tartrate (LOPRESSOR) 25 MG tablet Take 25 mg by mouth daily.      . Multiple Vitamin (MULTIVITAMIN WITH MINERALS) TABS Take 1 tablet by mouth daily. Diabetic pak by Ashby Dawes from [employer]      . polyethylene glycol (MIRALAX / GLYCOLAX) packet Take 17 g by mouth daily as needed. For constipation      . Polyvinyl Alcohol-Povidone (REFRESH OP) Place 1 drop into both eyes 2 (two) times daily. Refresh Gel Opth      . potassium chloride SA (K-DUR,KLOR-CON) 20 MEQ tablet Take 20 mEq by mouth daily.      . silodosin (RAPAFLO) 8 MG CAPS capsule Take 8 mg by mouth daily with breakfast.      . Tafluprost (ZIOPTAN) 0.0015 % SOLN Place 1 drop into both eyes every other day. Use at night      .  torsemide (DEMADEX) 20 MG tablet Take 3 tablets (60 mg total) by mouth 2 (two) times daily.  180 tablet  1   No current facility-administered medications for this visit.   Allergies as of 12/03/2012 - Review Complete 12/03/2012  Allergen Reaction Noted  . Statins Other (See Comments) 02/29/2012    reports that he has never smoked. He has never used smokeless tobacco. He reports that he does not drink alcohol or use illicit drugs.     Review of Systems: he has low back pain and weakness of his lower extremities he complains of urinary hesitancy with constipationPertinent positive and negative review of systems were noted in the above HPI section. All other review of systems were otherwise negative.  Vital signs were reviewed in today's medical record Physical Exam: General: Well developed , well nourished, no acute distress Skin: anicteric Head: Normocephalic and atraumatic Eyes:   sclerae anicteric, EOMI Ears: Normal auditory acuity Mouth: No deformity or lesions Neck: Supple, no masses or thyromegaly Lungs: Clear throughout to auscultation Heart: Regular rate and rhythm; no murmurs, rubs or bruits Abdomen: Soft, non tender and non distended. No masses, hepatosplenomegaly or hernias noted. Normal Bowel sounds Rectal:deferred Musculoskeletal: Symmetrical with no gross deformities  Skin: No lesions on visible extremities Pulses:  Normal pulses noted Extremities: No clubbing, cyanosis, edema or deformities noted Neurological: Alert oriented x 4, grossly nonfocal Cervical Nodes:  No significant cervical adenopathy Inguinal Nodes: No significant inguinal adenopathy Psychological:  Alert and cooperative. Normal mood and affect

## 2012-12-11 ENCOUNTER — Telehealth: Payer: Self-pay | Admitting: Gastroenterology

## 2012-12-11 MED ORDER — LACTULOSE SOLN: Status: DC

## 2012-12-11 NOTE — Telephone Encounter (Signed)
Spoke with Johnny Bridge and she is aware. Script sent to the pharmacy.

## 2012-12-11 NOTE — Telephone Encounter (Signed)
Pt was given linzess for constipation. Per caregiver it is not working. OV note mentions adding Lactulose if linzess not working. Please advise.

## 2012-12-11 NOTE — Telephone Encounter (Signed)
D/c Linzess. Begin lactulose 15cc bid.  If no effect in 3 dayscan increase to 30cc bid.  If at any point he develops diarrhea he should reduce the dose immediately. C/b in 1 week.

## 2013-01-06 ENCOUNTER — Telehealth: Payer: Self-pay | Admitting: Gastroenterology

## 2013-01-06 NOTE — Telephone Encounter (Signed)
Miralax qid until adequate BM and then Miralax bid. Further advice from Dr. Arlyce Dice, returning tomorrow.

## 2013-01-06 NOTE — Telephone Encounter (Signed)
Spoke with pt and he is aware. 

## 2013-01-06 NOTE — Telephone Encounter (Signed)
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 He was started on Linzess and it did not work. Pt was then started on Lactulose. Pt reports he is taking Lactulose 2TBSP BID. States the Lactulose is not working now, pt reports no bowel movement since Sunday. Dr. Russella Dar as doc of the day please advise.

## 2013-02-18 ENCOUNTER — Other Ambulatory Visit: Payer: Self-pay | Admitting: Interventional Cardiology

## 2013-03-17 ENCOUNTER — Encounter: Payer: Self-pay | Admitting: Internal Medicine

## 2013-04-01 ENCOUNTER — Encounter: Payer: Self-pay | Admitting: Interventional Cardiology

## 2013-04-03 ENCOUNTER — Encounter (INDEPENDENT_AMBULATORY_CARE_PROVIDER_SITE_OTHER): Payer: Self-pay

## 2013-04-03 ENCOUNTER — Encounter: Payer: Self-pay | Admitting: Interventional Cardiology

## 2013-04-03 ENCOUNTER — Ambulatory Visit (INDEPENDENT_AMBULATORY_CARE_PROVIDER_SITE_OTHER): Payer: Medicare Other | Admitting: Interventional Cardiology

## 2013-04-03 VITALS — BP 134/73 | HR 60 | Ht 67.0 in | Wt 192.8 lb

## 2013-04-03 DIAGNOSIS — I4891 Unspecified atrial fibrillation: Secondary | ICD-10-CM

## 2013-04-03 DIAGNOSIS — I480 Paroxysmal atrial fibrillation: Secondary | ICD-10-CM

## 2013-04-03 DIAGNOSIS — I5032 Chronic diastolic (congestive) heart failure: Secondary | ICD-10-CM

## 2013-04-03 DIAGNOSIS — Z950 Presence of cardiac pacemaker: Secondary | ICD-10-CM

## 2013-04-03 DIAGNOSIS — I509 Heart failure, unspecified: Secondary | ICD-10-CM

## 2013-04-03 DIAGNOSIS — Z7901 Long term (current) use of anticoagulants: Secondary | ICD-10-CM | POA: Insufficient documentation

## 2013-04-03 DIAGNOSIS — N183 Chronic kidney disease, stage 3 unspecified: Secondary | ICD-10-CM

## 2013-04-03 DIAGNOSIS — Z0000 Encounter for general adult medical examination without abnormal findings: Secondary | ICD-10-CM

## 2013-04-03 DIAGNOSIS — I10 Essential (primary) hypertension: Secondary | ICD-10-CM

## 2013-04-03 NOTE — Patient Instructions (Signed)
Your physician recommends that you continue on your current medications as directed. Please refer to the Current Medication list given to you today.  You have an appt with Dr.James Allred (EP) July 08, 2012. You remote device service is being restored with [employer]   You have an appt with Dr.Smith 07/17/13 @ 9am  You have received your flu shot today

## 2013-04-03 NOTE — Progress Notes (Signed)
Patient ID: [name], male   DOB: 1918-09-11, 79 y.o.   MRN: [id number]    [address] [place], [place]  [address] Phone: [phone] Fax:  [phone]  Date:  04/03/2013   ID:  [name], DOB [birth year], MRN [id number]  PCP:  [name], MD   ASSESSMENT:  1. Chronic diastolic heart failure, stable. 2. Paroxysmal atrial fibrillation, asymptomatic 3. Chronic anticoagulation therapy do to problem #2 4. DDD pacemaker 5. Hypertension 6. Prior embolic CVA  PLAN:  1. Continue Eliquis 2. No change in heart failure regimen 3. Clinical followup in 2-3 months 4. Dr. Audrie Lia will monitor the patient's metabolic status and perform blood work as necessary 5. Pacemaker telemetry and followup was reestablished today. He had been mistakenly terminated from his transtelephonic provider. 6. 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 He has had a prior small embolic CVA. He has had no bleeding on Coumadin. He denies episodes of fluid retention, dyspnea, and chest pain. No episodes of syncope or falls. Overall he is doing quite well and has not been hospitalized in greater than a year.   Wt Readings from Last 3 Encounters:  04/03/13 192 lb 12.8 oz (87.454 kg)  12/03/12 198 lb 12.8 oz (90.175 kg)  08/24/12 187 lb 4.8 oz (84.959 kg)     Past Medical History  Diagnosis Date  . CHF (congestive heart failure)   . Diabetes mellitus   . Hypertension   . Hyperlipidemia   . Coronary artery disease   . Spinal stenosis   . Dysrhythmia   . Pneumonia   . Pacemaker   . Arthritis   . Anemia     Current Outpatient Prescriptions  Medication Sig Dispense Refill  . allopurinol (ZYLOPRIM) 100 MG tablet Take 100 mg by mouth at bedtime.      [name] apixaban (ELIQUIS) 2.5 MG TABS tablet  Take 1 tablet (2.5 mg total) by mouth 2 (two) times daily.  60 tablet  1  . dutasteride (AVODART) 0.5 MG capsule Take 0.5 mg by mouth every morning.       . ferrous sulfate 325 (65 FE) MG tablet Take 325 mg by mouth daily with breakfast.      . insulin glargine (LANTUS) 100 UNIT/ML injection Inject 15 Units into the skin at bedtime.       . insulin lispro (HUMALOG) 100 UNIT/ML injection Inject 9-10 Units into the skin 3 (three) times daily before meals. 9 units with breakfast and lunch & 10 units after supper      . isosorbide mononitrate (IMDUR) 60 MG 24 hr tablet Take 1 tablet (60 mg total) by mouth daily.  30 tablet  1  . Lactulose SOLN Take 15cc by mouth two times daily for constipation. If no effect in 3 days: Increase to 30cc by mouth two times daily. If diarrhea develops decrease the  Dose immediately.  500 mL  2  . losartan (COZAAR) 100 MG tablet Take 100 mg by mouth daily.      . metoprolol tartrate (LOPRESSOR) 25 MG tablet Take 25 mg by mouth daily.      . Multiple Vitamin (MULTIVITAMIN WITH MINERALS) TABS Take 1 tablet by mouth daily. Diabetic pak by Ashby Dawes from [employer]      . NOVOLOG FLEXPEN 100 UNIT/ML SOPN FlexPen  As directed      . polyethylene glycol (MIRALAX / GLYCOLAX) packet Take 17 g by mouth daily as needed. For constipation      . Polyvinyl Alcohol-Povidone (REFRESH OP) Place 1 drop into both eyes 2 (two) times daily. Refresh Gel Opth      . potassium chloride SA (K-DUR,KLOR-CON) 20 MEQ tablet Take 20 mEq by mouth daily.      . silodosin (RAPAFLO) 8 MG CAPS capsule Take 8 mg by mouth daily with breakfast.      . Tafluprost (ZIOPTAN) 0.0015 % SOLN Place 1 drop into both eyes every other day. Use at night      . torsemide (DEMADEX) 20 MG tablet take 3 tablet by mouth twice a day  180 tablet  3   No current facility-administered medications for this visit.    Allergies:    Allergies  Allergen Reactions  . Statins Other (See Comments)    rhabdomyolisis    Social  History:  The patient  reports that he has never smoked. He has never used smokeless tobacco. He reports that he does not drink alcohol or use illicit drugs.   ROS:  Please see the history of present illness.   Denies melena, hematuria, transient neurological symptoms, medication side effects,   All other systems reviewed and negative.   OBJECTIVE: VS:  BP 134/73  Pulse 60  Ht 5\' 7"  (1.702 m)  Wt 192 lb 12.8 oz (87.454 kg)  BMI 30.19 kg/m2 Well nourished, well developed, in no acute distress, younger than stated age HEENT: normal Neck: JVD flat while sitting and also at 30. Carotid bruit no bruits  Cardiac:  normal S1, S2; RRR; no murmur Lungs:  clear to auscultation bilaterally, no wheezing, rhonchi or rales Abd: soft, nontender, no hepatomegaly Ext: Edema trace bilateral. Pulses 2+ Skin: warm and dry Neuro:  CNs 2-12 intact, no focal abnormalities noted  EKG:  Not repeated       Signed, Darci Needle III, MD 04/03/2013 10:38 AM

## 2013-04-03 NOTE — Progress Notes (Signed)
Patient ID: [name], male   DOB: 1918-10-11, 79 y.o.   MRN: [id number]    [address] [place], [place]  [address] Phone: [phone] Fax:  [phone]  Date:  04/03/2013   ID:  [name], DOB 12-08-1918, MRN [id number]  PCP:  [name], MD   ASSESSMENT:  1. Chronic diastolic heart failure, stable 2. Chronic anticoagulation, Eliquis, with no complications to this point 3. Paroxysmal atrial fibrillation, asymptomatic 4. DDD pacemaker for AV block 5. Hypertension, controlled  PLAN:  1. Continue current medical regimen 2. 2 month clinical followup 3. Call if weight gain, dyspnea, prolonged palpitations, or chest pain 4. Call if falls especially if head trauma involved 5. Pacemaker clinic followup and transtelephonic telemetry as arranged today    SUBJECTIVE: [name] is a 79 y.o. 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 He has history of chronic diastolic heart failure, paroxysmal atrial fibrillation, small embolic CVA, and history of AV block requiring DDD pacemaker. No hospitalization in the past 12 months. He denies chest pain, orthopnea, PND, and edema. No medication side effects. No falls. No blood in urine or stool. Overall stable and doing quite well per   Wt Readings from Last 3 Encounters:  04/03/13 192 lb 12.8 oz (87.454 kg)  12/03/12 198 lb 12.8 oz (90.175 kg)  08/24/12 187 lb 4.8 oz (84.959 kg)     Past Medical History  Diagnosis Date  . CHF (congestive heart failure)   . Diabetes mellitus   . Hypertension   . Hyperlipidemia   . Coronary artery disease   . Spinal stenosis   . Dysrhythmia   . Pneumonia   . Pacemaker   . Arthritis   . Anemia     Current Outpatient Prescriptions  Medication Sig Dispense Refill  . allopurinol (ZYLOPRIM) 100 MG tablet Take 100 mg by mouth at bedtime.      [name] apixaban (ELIQUIS) 2.5 MG TABS tablet Take 1 tablet (2.5 mg total) by mouth 2 (two) times daily.  60 tablet  1  .  dutasteride (AVODART) 0.5 MG capsule Take 0.5 mg by mouth every morning.       . ferrous sulfate 325 (65 FE) MG tablet Take 325 mg by mouth daily with breakfast.      . insulin glargine (LANTUS) 100 UNIT/ML injection Inject 15 Units into the skin at bedtime.       . insulin lispro (HUMALOG) 100 UNIT/ML injection Inject 9-10 Units into the skin 3 (three) times daily before meals. 9 units with breakfast and lunch & 10 units after supper      . isosorbide mononitrate (IMDUR) 60 MG 24 hr tablet Take 1 tablet (60 mg total) by mouth daily.  30 tablet  1  . Lactulose SOLN Take 15cc by mouth two times daily for constipation. If no effect in 3 days: Increase to 30cc by mouth two times daily. If diarrhea develops decrease the  Dose immediately.  500 mL  2  . losartan (COZAAR) 100 MG tablet Take 100 mg by mouth daily.      . metoprolol tartrate (LOPRESSOR) 25 MG tablet Take 25 mg by mouth daily.      . Multiple Vitamin (MULTIVITAMIN WITH MINERALS) TABS Take 1 tablet by mouth daily. Diabetic pak by Ashby Dawes from [employer]      . NOVOLOG FLEXPEN 100 UNIT/ML SOPN FlexPen As directed      . polyethylene glycol (MIRALAX / GLYCOLAX) packet Take  17 g by mouth daily as needed. For constipation      . Polyvinyl Alcohol-Povidone (REFRESH OP) Place 1 drop into both eyes 2 (two) times daily. Refresh Gel Opth      . potassium chloride SA (K-DUR,KLOR-CON) 20 MEQ tablet Take 20 mEq by mouth daily.      . silodosin (RAPAFLO) 8 MG CAPS capsule Take 8 mg by mouth daily with breakfast.      . Tafluprost (ZIOPTAN) 0.0015 % SOLN Place 1 drop into both eyes every other day. Use at night      . torsemide (DEMADEX) 20 MG tablet take 3 tablet by mouth twice a day  180 tablet  3   No current facility-administered medications for this visit.    Allergies:    Allergies  Allergen Reactions  . Statins Other (See Comments)    rhabdomyolisis    Social History:  The patient  reports that he has never smoked. He has never used smokeless  tobacco. He reports that he does not drink alcohol or use illicit drugs.   ROS:  Please see the history of present illness.   Weight bear use but has been pretty tight within 5 pounds. Some anxiety about his age and medical problems. Denies chest pain.   All other systems reviewed and negative.   OBJECTIVE: VS:  BP 134/73  Pulse 60  Ht 5\' 7"  (1.702 m)  Wt 192 lb 12.8 oz (87.454 kg)  BMI 30.19 kg/m2 Well nourished, well developed, in no acute distress, obese HEENT: normal Neck: JVD flat. Carotid bruit 2+  Cardiac:  normal S1, S2; RRR; no murmur Lungs:  clear to auscultation bilaterally, no wheezing, rhonchi or rales Abd: soft, nontender, no hepatomegaly Ext: Edema trace bilateral. Pulses 2+ Skin: warm and dry Neuro:  CNs 2-12 intact, no focal abnormalities noted  EKG:  Not       Signed, Darci Needle III, MD 04/03/2013 10:55 AM

## 2013-04-30 ENCOUNTER — Other Ambulatory Visit: Payer: Self-pay | Admitting: Neurosurgery

## 2013-04-30 ENCOUNTER — Ambulatory Visit
Admission: RE | Admit: 2013-04-30 | Discharge: 2013-04-30 | Disposition: A | Discharge status: Home / Self Care | Payer: Medicare Other | Source: Ambulatory Visit | Attending: Neurosurgery | Admitting: Neurosurgery

## 2013-04-30 DIAGNOSIS — M48061 Spinal stenosis, lumbar region without neurogenic claudication: Secondary | ICD-10-CM

## 2013-05-05 ENCOUNTER — Encounter (HOSPITAL_COMMUNITY): Payer: Self-pay | Admitting: Emergency Medicine

## 2013-05-05 ENCOUNTER — Inpatient Hospital Stay (HOSPITAL_COMMUNITY)
Admission: EM | Admit: 2013-05-05 | Discharge: 2013-05-09 | DRG: 193 | Disposition: A | Discharge status: Home / Self Care | Payer: Medicare Other | Attending: Internal Medicine | Admitting: Internal Medicine

## 2013-05-05 ENCOUNTER — Emergency Department (HOSPITAL_COMMUNITY): Payer: Medicare Other

## 2013-05-05 DIAGNOSIS — J09X1 Influenza due to identified novel influenza A virus with pneumonia: Principal | ICD-10-CM | POA: Diagnosis present

## 2013-05-05 DIAGNOSIS — E785 Hyperlipidemia, unspecified: Secondary | ICD-10-CM | POA: Diagnosis present

## 2013-05-05 DIAGNOSIS — N183 Chronic kidney disease, stage 3 unspecified: Secondary | ICD-10-CM | POA: Diagnosis present

## 2013-05-05 DIAGNOSIS — M129 Arthropathy, unspecified: Secondary | ICD-10-CM | POA: Diagnosis present

## 2013-05-05 DIAGNOSIS — R6889 Other general symptoms and signs: Secondary | ICD-10-CM

## 2013-05-05 DIAGNOSIS — I5032 Chronic diastolic (congestive) heart failure: Secondary | ICD-10-CM

## 2013-05-05 DIAGNOSIS — E1142 Type 2 diabetes mellitus with diabetic polyneuropathy: Secondary | ICD-10-CM | POA: Diagnosis present

## 2013-05-05 DIAGNOSIS — R339 Retention of urine, unspecified: Secondary | ICD-10-CM | POA: Diagnosis present

## 2013-05-05 DIAGNOSIS — I119 Hypertensive heart disease without heart failure: Secondary | ICD-10-CM | POA: Diagnosis present

## 2013-05-05 DIAGNOSIS — Z7901 Long term (current) use of anticoagulants: Secondary | ICD-10-CM

## 2013-05-05 DIAGNOSIS — I10 Essential (primary) hypertension: Secondary | ICD-10-CM

## 2013-05-05 DIAGNOSIS — I129 Hypertensive chronic kidney disease with stage 1 through stage 4 chronic kidney disease, or unspecified chronic kidney disease: Secondary | ICD-10-CM | POA: Diagnosis present

## 2013-05-05 DIAGNOSIS — Z950 Presence of cardiac pacemaker: Secondary | ICD-10-CM | POA: Diagnosis present

## 2013-05-05 DIAGNOSIS — I2510 Atherosclerotic heart disease of native coronary artery without angina pectoris: Secondary | ICD-10-CM | POA: Diagnosis present

## 2013-05-05 DIAGNOSIS — J9600 Acute respiratory failure, unspecified whether with hypoxia or hypercapnia: Secondary | ICD-10-CM

## 2013-05-05 DIAGNOSIS — I480 Paroxysmal atrial fibrillation: Secondary | ICD-10-CM | POA: Diagnosis present

## 2013-05-05 DIAGNOSIS — I509 Heart failure, unspecified: Secondary | ICD-10-CM

## 2013-05-05 DIAGNOSIS — Z79899 Other long term (current) drug therapy: Secondary | ICD-10-CM

## 2013-05-05 DIAGNOSIS — Z96659 Presence of unspecified artificial knee joint: Secondary | ICD-10-CM

## 2013-05-05 DIAGNOSIS — Z794 Long term (current) use of insulin: Secondary | ICD-10-CM

## 2013-05-05 DIAGNOSIS — I4891 Unspecified atrial fibrillation: Secondary | ICD-10-CM | POA: Diagnosis present

## 2013-05-05 DIAGNOSIS — I5033 Acute on chronic diastolic (congestive) heart failure: Secondary | ICD-10-CM | POA: Diagnosis present

## 2013-05-05 DIAGNOSIS — I248 Other forms of acute ischemic heart disease: Secondary | ICD-10-CM | POA: Diagnosis present

## 2013-05-05 DIAGNOSIS — D61818 Other pancytopenia: Secondary | ICD-10-CM | POA: Diagnosis present

## 2013-05-05 DIAGNOSIS — J189 Pneumonia, unspecified organism: Secondary | ICD-10-CM | POA: Diagnosis present

## 2013-05-05 DIAGNOSIS — J101 Influenza due to other identified influenza virus with other respiratory manifestations: Secondary | ICD-10-CM | POA: Diagnosis present

## 2013-05-05 DIAGNOSIS — E119 Type 2 diabetes mellitus without complications: Secondary | ICD-10-CM | POA: Diagnosis present

## 2013-05-05 DIAGNOSIS — N179 Acute kidney failure, unspecified: Secondary | ICD-10-CM | POA: Diagnosis present

## 2013-05-05 DIAGNOSIS — I2489 Other forms of acute ischemic heart disease: Secondary | ICD-10-CM | POA: Diagnosis present

## 2013-05-05 LAB — CG4 I-STAT (LACTIC ACID): Lactic Acid, Venous: 3.16 mmol/L — ABNORMAL HIGH (ref 0.5–2.2)

## 2013-05-05 LAB — CBC WITH DIFFERENTIAL/PLATELET
Basophils Absolute: 0 10*3/uL (ref 0.0–0.1)
Basophils Relative: 0 % (ref 0–1)
HCT: 36 % — ABNORMAL LOW (ref 39.0–52.0)
Lymphocytes Relative: 14 % (ref 12–46)
MCHC: 33.9 g/dL (ref 30.0–36.0)
Monocytes Absolute: 0.7 10*3/uL (ref 0.1–1.0)
Neutro Abs: 5.3 10*3/uL (ref 1.7–7.7)
Neutrophils Relative %: 75 % (ref 43–77)
Platelets: 129 10*3/uL — ABNORMAL LOW (ref 150–400)
RDW: 14.1 % (ref 11.5–15.5)
WBC: 7.1 10*3/uL (ref 4.0–10.5)

## 2013-05-05 LAB — BASIC METABOLIC PANEL
BUN: 33 mg/dL — ABNORMAL HIGH (ref 6–23)
Chloride: 97 mEq/L (ref 96–112)
Creatinine, Ser: 1.72 mg/dL — ABNORMAL HIGH (ref 0.50–1.35)
GFR calc Af Amer: 38 mL/min — ABNORMAL LOW (ref >90)
Potassium: 4.2 mEq/L (ref 3.5–5.1)
Sodium: 138 mEq/L (ref 135–145)

## 2013-05-05 LAB — TROPONIN I: Troponin I: <0.3 ng/mL (ref <0.30)

## 2013-05-05 LAB — GLUCOSE, CAPILLARY: Glucose-Capillary: 214 mg/dL — ABNORMAL HIGH (ref 70–99)

## 2013-05-05 MED ORDER — DEXTROSE 5 % IV SOLN
500.0000 mg | INTRAVENOUS | Status: DC
  Administered 2013-05-05: 500 mg via INTRAVENOUS
  Filled 2013-05-05 (×2): qty 500

## 2013-05-05 MED ORDER — DEXTROSE 5 % IV SOLN: 500.0000 mg | Freq: Once | INTRAVENOUS | Status: DC

## 2013-05-05 MED ORDER — LATANOPROST 0.005 % OP SOLN
1.0000 [drp] | OPHTHALMIC | Status: DC
  Administered 2013-05-05 – 2013-05-09 (×3): 1 [drp] via OPHTHALMIC
  Filled 2013-05-05: qty 2.5

## 2013-05-05 MED ORDER — DEXTROSE 5 % IV SOLN
1.0000 g | INTRAVENOUS | Status: DC
  Filled 2013-05-05: qty 10

## 2013-05-05 MED ORDER — SODIUM CHLORIDE 0.9 % IV BOLUS (SEPSIS)
500.0000 mL | Freq: Once | INTRAVENOUS | Status: AC
  Administered 2013-05-05: 500 mL via INTRAVENOUS

## 2013-05-05 MED ORDER — METOPROLOL TARTRATE 25 MG PO TABS
25.0000 mg | ORAL_TABLET | Freq: Every day | ORAL | Status: DC
  Administered 2013-05-06 – 2013-05-09 (×4): 25 mg via ORAL
  Filled 2013-05-05 (×5): qty 1

## 2013-05-05 MED ORDER — ONDANSETRON HCL 4 MG PO TABS: 4.0000 mg | ORAL_TABLET | Freq: Four times a day (QID) | ORAL | Status: DC | PRN

## 2013-05-05 MED ORDER — SODIUM CHLORIDE 0.9 % IJ SOLN
3.0000 mL | Freq: Two times a day (BID) | INTRAMUSCULAR | Status: DC
  Administered 2013-05-05 – 2013-05-06 (×2): 3 mL via INTRAVENOUS

## 2013-05-05 MED ORDER — DEXTROMETHORPHAN POLISTIREX 30 MG/5ML PO LQCR
15.0000 mg | Freq: Once | ORAL | Status: AC
  Administered 2013-05-05: 15 mg via ORAL
  Filled 2013-05-05: qty 5

## 2013-05-05 MED ORDER — LEVALBUTEROL HCL 0.63 MG/3ML IN NEBU: 0.6300 mg | INHALATION_SOLUTION | Freq: Four times a day (QID) | RESPIRATORY_TRACT | Status: DC

## 2013-05-05 MED ORDER — GUAIFENESIN-DM 100-10 MG/5ML PO SYRP
5.0000 mL | ORAL_SOLUTION | ORAL | Status: DC | PRN
  Administered 2013-05-05 – 2013-05-06 (×2): 5 mL via ORAL
  Filled 2013-05-05 (×2): qty 5

## 2013-05-05 MED ORDER — SODIUM CHLORIDE 0.9 % IJ SOLN: 3.0000 mL | INTRAMUSCULAR | Status: DC | PRN

## 2013-05-05 MED ORDER — ISOSORBIDE MONONITRATE ER 30 MG PO TB24
30.0000 mg | ORAL_TABLET | Freq: Every day | ORAL | Status: DC
  Administered 2013-05-06 – 2013-05-09 (×4): 30 mg via ORAL
  Filled 2013-05-05 (×5): qty 1

## 2013-05-05 MED ORDER — TAMSULOSIN HCL 0.4 MG PO CAPS
0.4000 mg | ORAL_CAPSULE | Freq: Every day | ORAL | Status: DC
  Administered 2013-05-05 – 2013-05-09 (×5): 0.4 mg via ORAL
  Filled 2013-05-05 (×5): qty 1

## 2013-05-05 MED ORDER — HYDROCOD POLST-CHLORPHEN POLST 10-8 MG/5ML PO LQCR
5.0000 mL | Freq: Two times a day (BID) | ORAL | Status: DC | PRN
  Administered 2013-05-05 – 2013-05-08 (×2): 5 mL via ORAL
  Filled 2013-05-05 (×2): qty 5

## 2013-05-05 MED ORDER — LOSARTAN POTASSIUM 50 MG PO TABS
100.0000 mg | ORAL_TABLET | Freq: Every day | ORAL | Status: DC
  Administered 2013-05-05 – 2013-05-09 (×5): 100 mg via ORAL
  Filled 2013-05-05 (×5): qty 2

## 2013-05-05 MED ORDER — POLYETHYL GLYCOL-PROPYL GLYCOL 0.4-0.3 % OP SOLN: 1.0000 [drp] | Freq: Every day | OPHTHALMIC | Status: DC

## 2013-05-05 MED ORDER — INSULIN ASPART 100 UNIT/ML ~~LOC~~ SOLN
0.0000 [IU] | Freq: Three times a day (TID) | SUBCUTANEOUS | Status: DC
  Administered 2013-05-06 – 2013-05-07 (×4): 2 [IU] via SUBCUTANEOUS
  Administered 2013-05-07: 3 [IU] via SUBCUTANEOUS
  Administered 2013-05-07: 13:00:00 5 [IU] via SUBCUTANEOUS
  Administered 2013-05-08 (×3): 2 [IU] via SUBCUTANEOUS
  Administered 2013-05-09: 3 [IU] via SUBCUTANEOUS
  Administered 2013-05-09: 07:00:00 1 [IU] via SUBCUTANEOUS

## 2013-05-05 MED ORDER — ACETAMINOPHEN 325 MG PO TABS
650.0000 mg | ORAL_TABLET | Freq: Four times a day (QID) | ORAL | Status: DC | PRN
  Administered 2013-05-06 (×2): 650 mg via ORAL
  Filled 2013-05-05 (×2): qty 2

## 2013-05-05 MED ORDER — APIXABAN 2.5 MG PO TABS
2.5000 mg | ORAL_TABLET | Freq: Two times a day (BID) | ORAL | Status: DC
  Administered 2013-05-05 – 2013-05-09 (×8): 2.5 mg via ORAL
  Filled 2013-05-05 (×9): qty 1

## 2013-05-05 MED ORDER — OSELTAMIVIR PHOSPHATE 75 MG PO CAPS
75.0000 mg | ORAL_CAPSULE | Freq: Once | ORAL | Status: AC
  Administered 2013-05-05: 75 mg via ORAL
  Filled 2013-05-05: qty 1

## 2013-05-05 MED ORDER — FERROUS SULFATE 325 (65 FE) MG PO TABS
325.0000 mg | ORAL_TABLET | ORAL | Status: DC
  Administered 2013-05-07 – 2013-05-09 (×2): 325 mg via ORAL
  Filled 2013-05-05 (×3): qty 1

## 2013-05-05 MED ORDER — IPRATROPIUM BROMIDE 0.02 % IN SOLN: 0.5000 mg | Freq: Four times a day (QID) | RESPIRATORY_TRACT | Status: DC

## 2013-05-05 MED ORDER — SODIUM CHLORIDE 0.9 % IJ SOLN
3.0000 mL | Freq: Two times a day (BID) | INTRAMUSCULAR | Status: DC
  Administered 2013-05-05 – 2013-05-09 (×7): 3 mL via INTRAVENOUS

## 2013-05-05 MED ORDER — ONDANSETRON HCL 4 MG/2ML IJ SOLN: 4.0000 mg | Freq: Four times a day (QID) | INTRAMUSCULAR | Status: DC | PRN

## 2013-05-05 MED ORDER — CARBOXYMETHYLCELLULOSE SODIUM 1 % OP SOLN
1.0000 [drp] | Freq: Two times a day (BID) | OPHTHALMIC | Status: DC
  Filled 2013-05-05 (×17): qty 15

## 2013-05-05 MED ORDER — ALLOPURINOL 100 MG PO TABS
100.0000 mg | ORAL_TABLET | Freq: Every day | ORAL | Status: DC
  Administered 2013-05-05 – 2013-05-08 (×4): 100 mg via ORAL
  Filled 2013-05-05 (×5): qty 1

## 2013-05-05 MED ORDER — TORSEMIDE 20 MG PO TABS
20.0000 mg | ORAL_TABLET | Freq: Three times a day (TID) | ORAL | Status: DC
  Administered 2013-05-05: 21:00:00 20 mg via ORAL
  Filled 2013-05-05 (×4): qty 1

## 2013-05-05 MED ORDER — CARBOXYMETHYLCELLULOSE SODIUM 1 % OP SOLN
1.0000 [drp] | Freq: Two times a day (BID) | OPHTHALMIC | Status: DC
  Filled 2013-05-05: qty 15

## 2013-05-05 MED ORDER — ACETAMINOPHEN 325 MG PO TABS
650.0000 mg | ORAL_TABLET | Freq: Once | ORAL | Status: AC
  Administered 2013-05-05: 650 mg via ORAL
  Filled 2013-05-05: qty 2

## 2013-05-05 MED ORDER — ADULT MULTIVITAMIN W/MINERALS CH
1.0000 | ORAL_TABLET | Freq: Every day | ORAL | Status: DC
  Administered 2013-05-06 – 2013-05-09 (×4): 1 via ORAL
  Filled 2013-05-05 (×5): qty 1

## 2013-05-05 MED ORDER — ALBUTEROL SULFATE (5 MG/ML) 0.5% IN NEBU: 2.5000 mg | INHALATION_SOLUTION | RESPIRATORY_TRACT | Status: DC | PRN

## 2013-05-05 MED ORDER — INSULIN GLARGINE 100 UNIT/ML ~~LOC~~ SOLN
12.0000 [IU] | Freq: Every day | SUBCUTANEOUS | Status: DC
  Administered 2013-05-06 – 2013-05-08 (×3): 12 [IU] via SUBCUTANEOUS
  Filled 2013-05-05 (×5): qty 0.12

## 2013-05-05 MED ORDER — POLYETHYLENE GLYCOL 3350 17 G PO PACK
17.0000 g | PACK | Freq: Every day | ORAL | Status: DC
  Administered 2013-05-05 – 2013-05-09 (×5): 17 g via ORAL
  Filled 2013-05-05 (×5): qty 1

## 2013-05-05 MED ORDER — DUTASTERIDE 0.5 MG PO CAPS
0.5000 mg | ORAL_CAPSULE | Freq: Every morning | ORAL | Status: DC
  Administered 2013-05-06 – 2013-05-09 (×4): 0.5 mg via ORAL
  Filled 2013-05-05 (×4): qty 1

## 2013-05-05 MED ORDER — DEXTROSE 5 % IV SOLN
1.0000 g | Freq: Once | INTRAVENOUS | Status: AC
  Administered 2013-05-05: 1 g via INTRAVENOUS
  Filled 2013-05-05: qty 10

## 2013-05-05 MED ORDER — SODIUM CHLORIDE 0.9 % IV SOLN: 250.0000 mL | INTRAVENOUS | Status: DC | PRN

## 2013-05-05 MED ORDER — OSELTAMIVIR PHOSPHATE 75 MG PO CAPS
75.0000 mg | ORAL_CAPSULE | Freq: Two times a day (BID) | ORAL | Status: DC
  Administered 2013-05-05 – 2013-05-06 (×2): 75 mg via ORAL
  Filled 2013-05-05 (×3): qty 1

## 2013-05-05 MED ORDER — POLYVINYL ALCOHOL 1.4 % OP SOLN
1.0000 [drp] | OPHTHALMIC | Status: DC | PRN
  Filled 2013-05-05: qty 15

## 2013-05-05 MED ORDER — POTASSIUM CHLORIDE CRYS ER 20 MEQ PO TBCR
20.0000 meq | EXTENDED_RELEASE_TABLET | Freq: Every day | ORAL | Status: DC
  Administered 2013-05-06: 10:00:00 20 meq via ORAL
  Filled 2013-05-05 (×2): qty 1

## 2013-05-05 MED ORDER — ACETAMINOPHEN 650 MG RE SUPP: 650.0000 mg | Freq: Four times a day (QID) | RECTAL | Status: DC | PRN

## 2013-05-05 MED ORDER — METOPROLOL TARTRATE 1 MG/ML IV SOLN
5.0000 mg | Freq: Four times a day (QID) | INTRAVENOUS | Status: DC | PRN
  Administered 2013-05-05: 5 mg via INTRAVENOUS
  Filled 2013-05-05: qty 5

## 2013-05-05 NOTE — ED Notes (Signed)
Pt c/o SOB and cough x 2 days; pt noted to have fever at present; pt sts hx of PNA and CHF in past

## 2013-05-05 NOTE — ED Notes (Signed)
NOTIFIED DR. GOLDSTON IN PERSON OF PATIENTS LAB RESULTS OF CG4 LACTIC ACID = 3.48mmoI/L , 05/05/2013.

## 2013-05-05 NOTE — ED Notes (Signed)
Pt presents with c/o that he cant stop coughing since 0100 this am.  Productive with clear sputum, states he is prone pneumonia.

## 2013-05-05 NOTE — H&P (Signed)
PATIENT DETAILS Name: [name] Age: 79 y.o. Sex: male Date of Birth: [birth year] Admit Date: 05/05/2013 BMW:[id number],[id number] S, MD   CHIEF COMPLAINT:  Persistent cough  HPI: [name] is a 79 y.o. male with a Past Medical History of chronic diastolic heart failure, atrial fibrillation on Eliquis therapy, diabetes and hypertension who presents today with the above noted complaint. Patient started having cough around 1:00 this morning, cough has been persistent and occurs every few minutes. Cough is mostly dry. He denies any shortness of breath. He did have subjective fever at home, was febrile with a fever of 102F here in the emergency room. Patient had a chest pain, headaches, nausea, vomiting or diarrhea. He denies any abdominal pain. He denies any generalized myalgias, chest congestion or runny nose. Patient was brought to the ED for further evaluation of his persistent cough, he was noted to be febrile, and I was asked to admit this patient for further evaluation and treatment. Per patient and family at bedside, he weighs himself everyday, his weight has been within his usual baseline of 192 pounds. He is now being admitted to the hospital for further evaluation and treatment.   ALLERGIES:   Allergies  Allergen Reactions  . Statins Other (See Comments)    rhabdomyolisis    PAST MEDICAL HISTORY: Past Medical History  Diagnosis Date  . CHF (congestive heart failure)   . Diabetes mellitus   . Hypertension   . Hyperlipidemia   . Coronary artery disease   . Spinal stenosis   . Dysrhythmia   . Pneumonia   . Pacemaker   . Arthritis   . Anemia     PAST SURGICAL HISTORY: Past Surgical History  Procedure Laterality Date  . Lumbar laminectomy    . Back surgery    . Total knee arthroplasty    . Quadriceps tendon repair    . Cataract extraction    . Insert / replace / remove pacemaker    . Carotid endarterectomy    . Yag laser application Right 11/07/2012     Procedure: YAG LASER CAPSULOTOMY OF RIGHT EYE;  Surgeon: Vita Erm., MD;  Location: [facility] OR;  Service: Ophthalmology;  Laterality: Right;  . Tonsillectomy      MEDICATIONS AT HOME: Prior to Admission medications   Medication Sig Start Date End Date Taking? Authorizing Provider  allopurinol (ZYLOPRIM) 100 MG tablet Take 100 mg by mouth at bedtime.   Yes Historical Provider, MD  apixaban (ELIQUIS) 2.5 MG TABS tablet Take 1 tablet (2.5 mg total) by mouth 2 (two) times daily. 08/24/12  Yes Henderson Cloud, MD  dutasteride (AVODART) 0.5 MG capsule Take 0.5 mg by mouth every morning.    Yes Historical Provider, MD  ferrous sulfate 325 (65 FE) MG tablet Take 325 mg by mouth every other day.    Yes Historical Provider, MD  insulin glargine (LANTUS) 100 UNIT/ML injection Inject 12 Units into the skin at bedtime.    Yes Historical Provider, MD  insulin lispro (HUMALOG) 100 UNIT/ML injection Inject 9-10 Units into the skin 3 (three) times daily before meals. 9 units with breakfast and lunch & 9 units after supper   Yes Historical Provider, MD  isosorbide mononitrate (IMDUR) 30 MG 24 hr tablet Take 30 mg by mouth daily.   Yes Historical Provider, MD  Lactulose SOLN Take 15cc by mouth two times daily for constipation. If no effect in 3 days: Increase to 30cc by mouth two times daily. If  diarrhea develops decrease the  Dose immediately. 12/11/12  Yes Louis Meckel, MD  losartan (COZAAR) 100 MG tablet Take 100 mg by mouth daily.   Yes Historical Provider, MD  metoprolol tartrate (LOPRESSOR) 25 MG tablet Take 25 mg by mouth daily. 08/24/12  Yes Estela Isaiah Blakes, MD  Multiple Vitamin (MULTIVITAMIN WITH MINERALS) TABS Take 1 tablet by mouth daily. Diabetic pak by Ashby Dawes from [facility]   Yes Historical Provider, MD  Polyethyl Glycol-Propyl Glycol (SYSTANE) 0.4-0.3 % SOLN Apply 1 drop to eye daily. Into affected eyes   Yes Historical Provider, MD  polyethylene glycol (MIRALAX / GLYCOLAX)  packet Take 17 g by mouth daily as needed. For constipation   Yes Historical Provider, MD  Polyvinyl Alcohol-Povidone (REFRESH OP) Place 1 drop into both eyes 2 (two) times daily. Refresh Gel Opth   Yes Historical Provider, MD  potassium chloride SA (K-DUR,KLOR-CON) 20 MEQ tablet Take 20 mEq by mouth daily.   Yes Historical Provider, MD  silodosin (RAPAFLO) 8 MG CAPS capsule Take 8 mg by mouth daily with breakfast.   Yes Historical Provider, MD  Tafluprost (ZIOPTAN) 0.0015 % SOLN Place 1 drop into both eyes every other day. Use at night   Yes Historical Provider, MD  torsemide (DEMADEX) 20 MG tablet take 3 tablet by mouth twice a day 02/18/13  Yes Lesleigh Noe, MD    FAMILY HISTORY: Family History  Problem Relation Age of Onset  . Multiple myeloma Father   . Hypertension Mother     SOCIAL HISTORY:  reports that he has never smoked. He has never used smokeless tobacco. He reports that he does not drink alcohol or use illicit drugs.  REVIEW OF SYSTEMS:  Constitutional:   No  weight loss, night sweats, chills.  HEENT:    No headaches, Difficulty swallowing,Tooth/dental problems,Sore throat,  No sneezing, itching, ear ache, nasal congestion, post nasal drip,   Cardio-vascular: No chest pain,  Orthopnea, PND, swelling in lower extremities, anasarca,         dizziness, palpitations  GI:  No heartburn, indigestion, abdominal pain, nausea, vomiting, diarrhea, change in       bowel habits, loss of appetite  Resp: No shortness of breath with exertion or at rest.   No coughing up of blood.No change in color of mucus.No wheezing.No chest wall deformity  Skin:  no rash or lesions.  GU:  no dysuria, change in color of urine, no urgency or frequency.  No flank pain.  Musculoskeletal: No joint pain or swelling.  No decreased range of motion.  No back pain.  Psych: No change in mood or affect. No depression or anxiety.  No memory loss.   PHYSICAL EXAM: Blood pressure 145/59, pulse  65, temperature 102 F (38.9 C), temperature source Rectal, resp. rate 15, SpO2 97.00%.  General appearance :Awake, alert, not in any distress. Speech Clear. Not toxic Looking. Coughing every few minutes. HEENT: Atraumatic and Normocephalic, pupils equally reactive to light and accomodation Neck: supple, no JVD. No cervical lymphadenopathy.  Chest:Good air entry bilaterally, few scattered rhonchi in a few by basilar rales but otherwise clear.  CVS: S1 S2 regular, no murmurs.  Abdomen: Bowel sounds present, Non tender and not distended with no gaurding, rigidity or rebound. Extremities: B/L Lower Ext shows trace edema, both legs are warm to touch Neurology: Awake alert, and oriented X 3, CN II-XII intact, Non focal Skin:No Rash Wounds:N/A  LABS ON ADMISSION:   Recent Labs  05/05/13 1351  NA  138  K 4.2  CL 97  CO2 30  GLUCOSE 138*  BUN 33*  CREATININE 1.72*  CALCIUM 9.3   No results found for this basename: AST, ALT, ALKPHOS, BILITOT, PROT, ALBUMIN,  in the last 72 hours No results found for this basename: LIPASE, AMYLASE,  in the last 72 hours  Recent Labs  05/05/13 1351  WBC 7.1  NEUTROABS 5.3  HGB 12.2*  HCT 36.0*  MCV 97.3  PLT 129*   No results found for this basename: CKTOTAL, CKMB, CKMBINDEX, TROPONINI,  in the last 72 hours No results found for this basename: DDIMER,  in the last 72 hours No components found with this basename: POCBNP,    RADIOLOGIC STUDIES ON ADMISSION: Dg Chest 2 View  05/05/2013   CLINICAL DATA:  Cough and shortness of breath.  EXAM: CHEST  2 VIEW  COMPARISON:  12/01/2012.  FINDINGS: Stable enlarged cardiac silhouette. Stable left subclavian pacemaker leads. The pulmonary vasculature and interstitial markings remain mildly prominent. Thoracic spine degenerative changes.  IMPRESSION: No acute abnormality. Stable cardiomegaly, mild pulmonary vascular congestion and mild chronic interstitial lung disease.   Electronically Signed   By: Gordan Payment M.D.   On: 05/05/2013 15:25     EKG: Independently reviewed. Based rhythm  ASSESSMENT AND PLAN: Present on Admission:  . Suspected pneumonia/influenza  - Empirically treat with Rocephin and Zithromax, empirically start Tamiflu.  - Repeat chest x-ray in a.m. to see if infiltrates have developed as patient still early in his presentation  - If influenza PCR negative, stop Tamiflu.  - Scheduled bronchodilators, incentive spirometry. - Follow cultures and urinary antigen studies - prn antitussives - Follow clinical course   . Chronic diastolic CHF (congestive heart failure) - Looks euvolemic to me, creatinine slightly higher than baseline. Per family and patient, weight at usual baseline.  - For now cautiously continue with diuretics, reassess volume status in a.m. If he continues to have persistent fever, and poor appetite, he may need gentle hydration.  - Will need to notify patient's primary cardiologist-Dr. Katrinka Blazing of patient's admission.  . CKD (chronic kidney disease), stage III - Creatinine very close to her baseline, recheck electrolytes in a.m. Continue with diuretics   . Diabetes mellitus - Continue with Lantus, place on SSI on inpatient   . Hypertension - Continue with preadmission antihypertensives.   . Pacemaker in place/ Paroxysmal atrial fibrillation - Continue metoprolol and Eliquis. Monitor in telemetry  Further plan will depend as patient's clinical course evolves and further radiologic and laboratory data become available. Patient will be monitored closely.  Above noted plan was discussed with patient/family, they were in agreement.   DVT Prophylaxis: Not needed as on Eliquis   Code Status: Full Code  Total time spent for admission equals 45 minutes.  [facility] Pager [phone]  If 7PM-7AM, please contact night-coverage [email or web address] Password [facility] 05/05/2013, 4:55 PM

## 2013-05-05 NOTE — Progress Notes (Signed)
Unit CM UR Completed by [facility] ED CM  W. Krrish Freund RN  

## 2013-05-05 NOTE — ED Provider Notes (Signed)
CSN: [id number]     Arrival date & time 05/05/13  1313 History   First MD Initiated Contact with Patient 05/05/13 1328     Chief Complaint  Patient presents with  . Cough  . Shortness of Breath   (Consider location/radiation/quality/duration/timing/severity/associated sxs/prior Treatment) HPI Comments: 79 year old male with 12 hours of cough. States the cough came on acutely and is very frequent. The patient is short of breath while coughing but otherwise not any shortness of breath or chest pain. The patient has not any fevers both on a low-grade temp of 100.4 orally in triage. Is not any other symptoms such as headache or sore throat. He has had some rhinorrhea. No muscle aches. The patient states she's had CHF and pneumonia in the past. He states that he is coughing up just a little bit of sputum which is clear. He states his not quite as bad has had pneumonia.   Past Medical History  Diagnosis Date  . CHF (congestive heart failure)   . Diabetes mellitus   . Hypertension   . Hyperlipidemia   . Coronary artery disease   . Spinal stenosis   . Dysrhythmia   . Pneumonia   . Pacemaker   . Arthritis   . Anemia    Past Surgical History  Procedure Laterality Date  . Lumbar laminectomy    . Back surgery    . Total knee arthroplasty    . Quadriceps tendon repair    . Cataract extraction    . Insert / replace / remove pacemaker    . Carotid endarterectomy    . Yag laser application Right 11/07/2012    Procedure: YAG LASER CAPSULOTOMY OF RIGHT EYE;  Surgeon: Vita Erm., MD;  Location: [facility] OR;  Service: Ophthalmology;  Laterality: Right;  . Tonsillectomy     Family History  Problem Relation Age of Onset  . Multiple myeloma Father   . Hypertension Mother    History  Substance Use Topics  . Smoking status: Never Smoker   . Smokeless tobacco: Never Used  . Alcohol Use: No     Comment: Cocktails occasionally    Review of Systems  Constitutional: Positive for fever.   HENT: Positive for rhinorrhea.   Respiratory: Positive for cough and shortness of breath.   Cardiovascular: Negative for chest pain and leg swelling.  Gastrointestinal: Negative for vomiting and abdominal pain.  Neurological: Negative for weakness.  All other systems reviewed and are negative.    Allergies  Statins  Home Medications   Current Outpatient Rx  Name  Route  Sig  Dispense  Refill  . allopurinol (ZYLOPRIM) 100 MG tablet   Oral   Take 100 mg by mouth at bedtime.         [name] apixaban (ELIQUIS) 2.5 MG TABS tablet   Oral   Take 1 tablet (2.5 mg total) by mouth 2 (two) times daily.   60 tablet   1   . dutasteride (AVODART) 0.5 MG capsule   Oral   Take 0.5 mg by mouth every morning.          . ferrous sulfate 325 (65 FE) MG tablet   Oral   Take 325 mg by mouth every other day.          . insulin glargine (LANTUS) 100 UNIT/ML injection   Subcutaneous   Inject 12 Units into the skin at bedtime.          . insulin lispro (HUMALOG) 100 UNIT/ML injection  Subcutaneous   Inject 9-10 Units into the skin 3 (three) times daily before meals. 9 units with breakfast and lunch & 9 units after supper         . isosorbide mononitrate (IMDUR) 30 MG 24 hr tablet   Oral   Take 30 mg by mouth daily.         . Lactulose SOLN      Take 15cc by mouth two times daily for constipation. If no effect in 3 days: Increase to 30cc by mouth two times daily. If diarrhea develops decrease the  Dose immediately.   500 mL   2   . losartan (COZAAR) 100 MG tablet   Oral   Take 100 mg by mouth daily.         . metoprolol tartrate (LOPRESSOR) 25 MG tablet   Oral   Take 25 mg by mouth daily.         . Multiple Vitamin (MULTIVITAMIN WITH MINERALS) TABS   Oral   Take 1 tablet by mouth daily. Diabetic pak by Ashby Dawes from [employer]         . Polyethyl Glycol-Propyl Glycol (SYSTANE) 0.4-0.3 % SOLN   Ophthalmic   Apply 1 drop to eye daily. Into affected eyes         .  polyethylene glycol (MIRALAX / GLYCOLAX) packet   Oral   Take 17 g by mouth daily as needed. For constipation         . Polyvinyl Alcohol-Povidone (REFRESH OP)   Both Eyes   Place 1 drop into both eyes 2 (two) times daily. Refresh Gel Opth         . potassium chloride SA (K-DUR,KLOR-CON) 20 MEQ tablet   Oral   Take 20 mEq by mouth daily.         . silodosin (RAPAFLO) 8 MG CAPS capsule   Oral   Take 8 mg by mouth daily with breakfast.         . Tafluprost (ZIOPTAN) 0.0015 % SOLN   Both Eyes   Place 1 drop into both eyes every other day. Use at night         . torsemide (DEMADEX) 20 MG tablet      take 3 tablet by mouth twice a day   180 tablet   3    BP 117/82  Pulse 65  Temp(Src) 102 F (38.9 C) (Rectal)  Resp 18  SpO2 97% Physical Exam  Nursing note and vitals reviewed. Constitutional: He is oriented to person, place, and time. He appears well-developed and well-nourished.  HENT:  Head: Normocephalic and atraumatic.  Right Ear: External ear normal.  Left Ear: External ear normal.  Nose: Nose normal.  Eyes: Right eye exhibits no discharge. Left eye exhibits no discharge.  Neck: Neck supple.  Cardiovascular: Normal rate, regular rhythm, normal heart sounds and intact distal pulses.   Pulmonary/Chest: Effort normal. He has decreased breath sounds in the left lower field.  Abdominal: Soft. There is no tenderness.  Musculoskeletal: He exhibits no edema.  Neurological: He is alert and oriented to person, place, and time.  Skin: Skin is warm and dry.    ED Course  Procedures (including critical care time) Labs Review Labs Reviewed  CBC WITH DIFFERENTIAL - Abnormal; Notable for the following:    RBC 3.70 (*)    Hemoglobin 12.2 (*)    HCT 36.0 (*)    Platelets 129 (*)    All other components within normal limits  CG4 I-STAT (LACTIC ACID) - Abnormal; Notable for the following:    Lactic Acid, Venous 3.16 (*)    All other components within normal limits   CULTURE, BLOOD (ROUTINE X 2)  CULTURE, BLOOD (ROUTINE X 2)  BASIC METABOLIC PANEL  INFLUENZA PANEL BY PCR   Imaging Review Dg Chest 2 View  05/05/2013   CLINICAL DATA:  Cough and shortness of breath.  EXAM: CHEST  2 VIEW  COMPARISON:  12/01/2012.  FINDINGS: Stable enlarged cardiac silhouette. Stable left subclavian pacemaker leads. The pulmonary vasculature and interstitial markings remain mildly prominent. Thoracic spine degenerative changes.  IMPRESSION: No acute abnormality. Stable cardiomegaly, mild pulmonary vascular congestion and mild chronic interstitial lung disease.   Electronically Signed   By: Gordan Payment M.D.   On: 05/05/2013 15:25    EKG Interpretation   None       MDM   1. Flu-like symptoms    By exam there is concern for pneumonia. His symptoms are very early MS a negative x-ray could still be pneumonia. He would be community acquired. He does have multiple risk factors such as diabetes and heart failure. He has a mildly elevated lactate is 3. He feels weak and is low thickening. The subcutaneous the would benefit from an inpatient admission. We'll treat with antibiotics as well as water fluid treat prophylactically with Tamiflu.    Audree Camel, MD 05/05/13 [phone]

## 2013-05-06 ENCOUNTER — Inpatient Hospital Stay (HOSPITAL_COMMUNITY): Payer: Medicare Other

## 2013-05-06 DIAGNOSIS — I5033 Acute on chronic diastolic (congestive) heart failure: Secondary | ICD-10-CM

## 2013-05-06 DIAGNOSIS — N183 Chronic kidney disease, stage 3 unspecified: Secondary | ICD-10-CM

## 2013-05-06 DIAGNOSIS — E119 Type 2 diabetes mellitus without complications: Secondary | ICD-10-CM

## 2013-05-06 DIAGNOSIS — J111 Influenza due to unidentified influenza virus with other respiratory manifestations: Secondary | ICD-10-CM

## 2013-05-06 DIAGNOSIS — J101 Influenza due to other identified influenza virus with other respiratory manifestations: Secondary | ICD-10-CM | POA: Diagnosis present

## 2013-05-06 LAB — GLUCOSE, CAPILLARY: Glucose-Capillary: 175 mg/dL — ABNORMAL HIGH (ref 70–99)

## 2013-05-06 LAB — COMPREHENSIVE METABOLIC PANEL
BUN: 36 mg/dL — ABNORMAL HIGH (ref 6–23)
CO2: 29 mEq/L (ref 19–32)
Calcium: 8.5 mg/dL (ref 8.4–10.5)
Creatinine, Ser: 1.92 mg/dL — ABNORMAL HIGH (ref 0.50–1.35)
GFR calc Af Amer: 33 mL/min — ABNORMAL LOW (ref >90)
GFR calc non Af Amer: 29 mL/min — ABNORMAL LOW (ref >90)
Glucose, Bld: 186 mg/dL — ABNORMAL HIGH (ref 70–99)
Potassium: 4 mEq/L (ref 3.5–5.1)
Total Bilirubin: 0.3 mg/dL (ref 0.3–1.2)

## 2013-05-06 LAB — PRO B NATRIURETIC PEPTIDE: Pro B Natriuretic peptide (BNP): 5209 pg/mL — ABNORMAL HIGH (ref 0–450)

## 2013-05-06 LAB — CBC
MCHC: 33.7 g/dL (ref 30.0–36.0)
MCV: 96.7 fL (ref 78.0–100.0)
Platelets: 148 10*3/uL — ABNORMAL LOW (ref 150–400)
RBC: 3.37 MIL/uL — ABNORMAL LOW (ref 4.22–5.81)
WBC: 8.8 10*3/uL (ref 4.0–10.5)

## 2013-05-06 LAB — INFLUENZA PANEL BY PCR (TYPE A & B)
Influenza A By PCR: POSITIVE — AB
Influenza B By PCR: NEGATIVE

## 2013-05-06 MED ORDER — FUROSEMIDE 10 MG/ML IJ SOLN
80.0000 mg | Freq: Two times a day (BID) | INTRAMUSCULAR | Status: AC
  Administered 2013-05-06 (×2): 80 mg via INTRAVENOUS
  Filled 2013-05-06: qty 8

## 2013-05-06 MED ORDER — OSELTAMIVIR PHOSPHATE 30 MG PO CAPS
30.0000 mg | ORAL_CAPSULE | Freq: Every day | ORAL | Status: DC
  Administered 2013-05-06 – 2013-05-08 (×3): 30 mg via ORAL
  Filled 2013-05-06 (×3): qty 1

## 2013-05-06 NOTE — Progress Notes (Signed)
05/05/13 1900-0700 shift. Around 2100 was notified by nurse secretary that pt.was having c/o SOB and that the charge nurse was in the pt's room. Went to room to assess patient. Pt.was on 2 L [place] of oxygen and oxygen saturation was 98-100%. Lung sounds were diminished no crackles or wheezes auscultated. Pt.had dry non-productive cough. And appeared anxious. Was also notified around this time by central telemetry that pt.HR had increased to the 170-180's nonsustained and that it appeared as if he wasn't paced on the monitor. VS were taken and EKG was done. HR shown on EKG was 95 bpm. MD on call for [facility] , Dr.Myers was paged and notified. New orders written for cardiac enzymes and prn metoprolol 5mg  IV. Was told to give pt.1 dose of metoprolol 5mg  IV. After metoprolol given  patient's HR ranged around 60's- 90's. Pt.also c/o some discomfort in his pelvic area. Pt.had voided with urinal twice before with some output. A bladder scan was done and showed 317 mL of urine.Told patient that we would continue to monitor and do a repeat bladder scan if needed. Repeated bladder scan and it showed 757 ml of urine. Paged MD on call, Dr.Myers for I&O cath. I&O cath done with an output of 750 ml of urine. Also notified MD on call , Dr.Myers this am of pt.'s troponin of 0.43. Pt.remains A/Ox3 and is ambulatory with 1 person assist and a walker, his personal caregiver remained at the bedside during the shift.

## 2013-05-06 NOTE — Progress Notes (Signed)
Pt bladder scanned for 492 cc at 1448, pt straight cathed for 700 cc, pt on IV lasix and flowmax, will continue to monitor

## 2013-05-06 NOTE — Progress Notes (Signed)
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 He denied chest pain or dyspnea. He was admitted for evaluation and management of suspected pneumonia/influenza.  Assessment/Plan:  Influenza A - Patient was admitted to telemetry and started empirically on IV Rocephin, azithromycin for presumed pneumonia and Tamiflu adjusted to renal functions. - Repeat chest x-ray suggests slight increase in degree of vascular congestion and right-sided interstitial edema. - Follow blood cultures - Influenza panel PCR positive for influenza A. DC antibiotics. Continue Tamiflu. Clinically less likely to be pneumonia in the absence of productive cough, leukocytosis and negative radiology. - Clinically better.  Acute on Chronic diastolic CHF - Chest x-ray suggests slightly increased volume overload. He apparently has propensity to decompensate easily. - Cardiology consultation appreciated and have given him a dose of Lasix. Monitor - Mildly elevated troponin, most likely secondary to demand ischemia from febrile illness. Management per cardiology   Chronic kidney disease stage III  - creatinine close to baseline. Monitor BMP daily.  Type II DM - Continue Lantus and SSI -Mildly uncontrolled   Hypertension  -Controlled. Continue home medications  Anemia and thrombocytopenia   - follow CBC in a.m. May be due to acute febrile illness from influenza  Paroxysmal A. fib/PPM  - AV paced on monitor. Continue metoprolol and anticoagulation.    Urinary retention - In and out catheterization when necessary   Code Status:  full  Family Communication:  none at bedside  Disposition Plan:  home in medically stable    Consultants:   cardiology    Procedures:   in and out cath when necessary   Antibiotics:   IV Rocephin and azithromycin 12/23-12/24    Subjective:  feels better. Denies dyspnea. Nonproductive cough is also better. No chest pain.   Objective: Filed Vitals:   05/05/13 2125 05/06/13 0630 05/06/13 0942 05/06/13 1602  BP:  125/45 128/48 135/51  Pulse:  64 117 60  Temp:  100.6 F (38.1 C)  100 F (37.8 C)  TempSrc:  Oral  Oral  Resp:  21  19  Height:      Weight:  91.9 kg (202 lb 9.6 oz)    SpO2: 100% 99% 100% 92%    Intake/Output Summary (Last 24 hours) at 05/06/13 1629 Last data filed at 05/06/13 0715  Gross per 24 hour  Intake    493 ml  Output   1100 ml  Net   -607 ml   Filed Weights   05/05/13 1750 05/06/13 0630  Weight: 90.1 kg (198 lb 10.2 oz) 91.9 kg (202 lb 9.6 oz)     Exam:  General exam:  elderly pleasant male lying comfortably supine in bed  Respiratory system:  occasional basal crackles but otherwise clear to auscultation . No increased work of breathing. Cardiovascular system: S1 & S2 heard, RRR. No JVD, murmurs, gallops, clicks or pedal edema. telemetry: AV paced rhythm.  Gastrointestinal system: Abdomen is nondistended, soft and nontender. Normal bowel sounds heard. Central nervous system: Alert and oriented. No focal neurological deficits. Extremities: Symmetric 5 x 5 power.   Data Reviewed: Basic Metabolic Panel:  Recent Labs Lab 05/05/13 1351 05/06/13 0347  NA 138 136  K 4.2 4.0  CL 97 97  CO2 30 29  GLUCOSE 138* 186*  BUN 33* 36*  CREATININE 1.72* 1.92*  CALCIUM 9.3 8.5   Liver Function Tests:  Recent Labs Lab 05/06/13 0347  AST 39*  ALT 13  ALKPHOS 52  BILITOT 0.3  PROT 6.8  ALBUMIN 3.1*   No results found for this basename: LIPASE, AMYLASE,  in the last 168 hours No results found for this basename: AMMONIA,  in the last 168 hours CBC:  Recent Labs Lab 05/05/13 1351 05/06/13 0347  WBC 7.1 8.8  NEUTROABS 5.3  --   HGB 12.2* 11.0*  HCT 36.0*  32.6*  MCV 97.3 96.7  PLT 129* 148*   Cardiac Enzymes:  Recent Labs Lab 05/05/13 2230 05/06/13 0348 05/06/13 0940  TROPONINI <0.30 0.43* 0.58*   BNP (last 3 results)  Recent Labs  11/21/12 2045 12/01/12 0410 05/06/13 0348  PROBNP 1134.0* 1354.0* 5209.0*   CBG:  Recent Labs Lab 05/05/13 1848 05/05/13 2112 05/05/13 2312 05/06/13 0629 05/06/13 1141  GLUCAP 115* 165* 214* 175* 196*    Recent Results (from the past 240 hour(s))  CULTURE, BLOOD (ROUTINE X 2)     Status: None   Collection Time    05/05/13  3:22 PM      Result Value Range Status   Specimen Description BLOOD HAND LEFT   Final   Special Requests BOTTLES DRAWN AEROBIC ONLY 10CC   Final   Culture  Setup Time     Final   Value: 05/05/2013 22:51     Performed at [facility]   Culture     Final   Value:        BLOOD CULTURE RECEIVED NO GROWTH TO DATE CULTURE WILL BE HELD FOR 5 DAYS BEFORE ISSUING A FINAL NEGATIVE REPORT     Performed at [facility]   Report Status PENDING   Incomplete  CULTURE, BLOOD (ROUTINE X 2)     Status: None   Collection Time    05/05/13  5:05 PM      Result Value Range Status   Specimen Description BLOOD ARM RIGHT   Final   Special Requests BOTTLES DRAWN AEROBIC ONLY 5CC   Final   Culture  Setup Time     Final   Value: 05/05/2013 22:51     Performed at [facility]   Culture     Final   Value:        BLOOD CULTURE RECEIVED NO GROWTH TO DATE CULTURE WILL BE HELD FOR 5 DAYS BEFORE ISSUING A FINAL NEGATIVE REPORT     Performed at [facility]   Report Status PENDING   Incomplete      Additional labs: 1. None     Studies: Dg Chest 2 View  05/05/2013   CLINICAL DATA:  Cough and shortness of breath.  EXAM: CHEST  2 VIEW  COMPARISON:  12/01/2012.  FINDINGS: Stable enlarged cardiac silhouette. Stable left subclavian pacemaker leads. The pulmonary vasculature and interstitial markings remain mildly prominent. Thoracic spine degenerative  changes.  IMPRESSION: No acute abnormality. Stable cardiomegaly, mild pulmonary vascular congestion and mild chronic interstitial lung disease.   Electronically Signed   By: Gordan Payment M.D.   On: 05/05/2013 15:25   Dg Chest Port 1 View  05/06/2013   CLINICAL DATA:  Shortness of breath and fever  EXAM: PORTABLE CHEST - 1 VIEW  COMPARISON:  05/05/2013  FINDINGS: Cardiac shadow is stable. A pacing device is again seen. The lungs are well aerated with evidence of vascular congestion and mild interstitial edema on the right.  This is slightly increased from the prior exam.  IMPRESSION: Slight increase in the degree of vascular congestion and right-sided interstitial edema.   Electronically Signed   By: Alcide Clever M.D.   On: 05/06/2013 07:56        Scheduled Meds: . allopurinol  100 mg Oral QHS  . apixaban  2.5 mg Oral BID  . azithromycin  500 mg Intravenous Once  . azithromycin  500 mg Intravenous Q24H  . cefTRIAXone (ROCEPHIN)  IV  1 g Intravenous Q24H  . dutasteride  0.5 mg Oral q morning - 10a  . ferrous sulfate  325 mg Oral QODAY  . furosemide  80 mg Intravenous BID  . insulin aspart  0-9 Units Subcutaneous TID WC  . insulin glargine  12 Units Subcutaneous QHS  . isosorbide mononitrate  30 mg Oral Daily  . latanoprost  1 drop Both Eyes QODAY  . losartan  100 mg Oral Daily  . metoprolol tartrate  25 mg Oral Daily  . multivitamin with minerals  1 tablet Oral Daily  . oseltamivir  30 mg Oral Daily  . polyethylene glycol  17 g Oral Daily  . potassium chloride SA  20 mEq Oral Daily  . sodium chloride  3 mL Intravenous Q12H  . sodium chloride  3 mL Intravenous Q12H  . tamsulosin  0.4 mg Oral Daily   Continuous Infusions:   Active Problems:   Chronic diastolic CHF (congestive heart failure)   Hypertension   Diabetes mellitus   CKD (chronic kidney disease), stage III   Paroxysmal atrial fibrillation   Chronic anticoagulation   Pacemaker   Pneumonia    Time spent:  45 minutes      Tahesha Skeet, MD, FACP, FHM. [facility] Pager [phone]  If 7PM-7AM, please contact night-coverage [email or web address] Password [email or web address] 05/06/2013, 4:29 PM    LOS: 1 day

## 2013-05-06 NOTE — Progress Notes (Addendum)
       Patient Name: [name] Date of Encounter: 05/06/2013    SUBJECTIVE:He is somewhat groggy. Does not feel well but denies SOB. No chest pain.  TELEMETRY:  AV pacing Filed Vitals:   05/05/13 2115 05/05/13 2125 05/06/13 0630 05/06/13 0942  BP: 154/73  125/45 128/48  Pulse: 100  64 117  Temp: 98.2 F (36.8 C)  100.6 F (38.1 C)   TempSrc: Oral  Oral   Resp: 22  21   Height:      Weight:   202 lb 9.6 oz (91.9 kg)   SpO2: 100% 100% 99% 100%    Intake/Output Summary (Last 24 hours) at 05/06/13 1156 Last data filed at 05/06/13 0715  Gross per 24 hour  Intake    493 ml  Output   1100 ml  Net   -607 ml    LABS: Basic Metabolic Panel:  Recent Labs  16/10/96 1351 05/06/13 0347  NA 138 136  K 4.2 4.0  CL 97 97  CO2 30 29  GLUCOSE 138* 186*  BUN 33* 36*  CREATININE 1.72* 1.92*  CALCIUM 9.3 8.5   CBC:  Recent Labs  05/05/13 1351 05/06/13 0347  WBC 7.1 8.8  NEUTROABS 5.3  --   HGB 12.2* 11.0*  HCT 36.0* 32.6*  MCV 97.3 96.7  PLT 129* 148*   Cardiac Enzymes:  Recent Labs  05/05/13 2230 05/06/13 0348 05/06/13 0940  TROPONINI <0.30 0.43* 0.58*   BNP    Component Value Date/Time   PROBNP 5209.0* 05/06/2013 0348   Radiology/Studies:  Portable CXR 12/24 IMPRESSION:  Slight increase in the degree of vascular congestion and right-sided  interstitial edema.    Physical Exam: Blood pressure 128/48, pulse 117, temperature 100.6 F (38.1 C), temperature source Oral, resp. rate 21, height 5\' 7"  (1.702 m), weight 202 lb 9.6 oz (91.9 kg), SpO2 100.00%. Weight change:    Rales both lung fields  ASSESSMENT:  1. Acute febrile illness 2. Acute on chronic DHF 3. PAF, resolved 4. Elevated troponin with no complaint of chest pain. Likely demand ischemia in setting of fever and A fib with RVR 5. Acute on chronic kidney failure. Plan:  D/C IV flluid IV diuretic Control fever with antipyretic therapy  Signed, Lesleigh Noe 05/06/2013,  11:56 AM

## 2013-05-07 ENCOUNTER — Encounter (HOSPITAL_COMMUNITY): Payer: Self-pay | Admitting: *Deleted

## 2013-05-07 LAB — CBC
HCT: 36.2 % — ABNORMAL LOW (ref 39.0–52.0)
Hemoglobin: 12.3 g/dL — ABNORMAL LOW (ref 13.0–17.0)
MCH: 32.6 pg (ref 26.0–34.0)
MCHC: 34 g/dL (ref 30.0–36.0)
MCV: 96 fL (ref 78.0–100.0)
Platelets: 145 10*3/uL — ABNORMAL LOW (ref 150–400)
RBC: 3.77 MIL/uL — ABNORMAL LOW (ref 4.22–5.81)
WBC: 6.7 10*3/uL (ref 4.0–10.5)

## 2013-05-07 LAB — BASIC METABOLIC PANEL
BUN: 40 mg/dL — ABNORMAL HIGH (ref 6–23)
CO2: 25 mEq/L (ref 19–32)
Calcium: 8.7 mg/dL (ref 8.4–10.5)
Chloride: 94 mEq/L — ABNORMAL LOW (ref 96–112)
Creatinine, Ser: 1.9 mg/dL — ABNORMAL HIGH (ref 0.50–1.35)
GFR calc non Af Amer: 29 mL/min — ABNORMAL LOW (ref >90)
Glucose, Bld: 268 mg/dL — ABNORMAL HIGH (ref 70–99)

## 2013-05-07 LAB — GLUCOSE, CAPILLARY
Glucose-Capillary: 191 mg/dL — ABNORMAL HIGH (ref 70–99)
Glucose-Capillary: 252 mg/dL — ABNORMAL HIGH (ref 70–99)
Glucose-Capillary: 208 mg/dL — ABNORMAL HIGH (ref 70–99)
Glucose-Capillary: 192 mg/dL — ABNORMAL HIGH (ref 70–99)

## 2013-05-07 LAB — LEGIONELLA ANTIGEN, URINE: Legionella Antigen, Urine: NEGATIVE

## 2013-05-07 MED ORDER — SENNOSIDES-DOCUSATE SODIUM 8.6-50 MG PO TABS
2.0000 | ORAL_TABLET | Freq: Every evening | ORAL | Status: DC | PRN
  Filled 2013-05-07: qty 2

## 2013-05-07 NOTE — Progress Notes (Signed)
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 He denied chest pain or dyspnea. He was admitted for evaluation and management of suspected pneumonia/influenza.  Assessment/Plan:  Influenza A - Patient was admitted to telemetry and started empirically on IV Rocephin, azithromycin for presumed pneumonia and Tamiflu adjusted to renal functions. - Repeat chest x-ray suggests slight increase in degree of vascular congestion and right-sided interstitial edema. - Blood cultures x2: Negative to date - Influenza panel PCR positive for influenza A. DC antibiotics. Continue Tamiflu. Clinically less likely to be pneumonia in the absence of productive cough, leukocytosis and negative radiology. - Clinically improving. Defervescing  Acute on Chronic diastolic CHF - Chest x-ray suggests slightly increased volume overload. He apparently has propensity to decompensate easily. - Cardiology consultation appreciated and gave him IV Lasix on 12/24. Diuretics being held today by cardiology secondary to bump in his creatinine-however creatinine has been stable over the last 24 hours. - Mildly elevated troponin, most likely secondary to demand ischemia from febrile illness. Management per cardiology  -  strict input output charting.   Chronic kidney disease stage III  - creatinine close to baseline. Monitor BMP daily.  Type II DM - Continue Lantus and SSI -Mildly uncontrolled   Hypertension  -Controlled. Continue home medications  Anemia and thrombocytopenia   - follow CBC in a.m. May be due to acute febrile illness from influenza - Stable    Paroxysmal A. fib/PPM  - AV paced on monitor. Continue metoprolol and anticoagulation.    Urinary  retention -  urinary catheter placed last night. Discuss with Dr. Brunilda Payor on 12/26    Code Status:  full  Family Communication:  discussed with patient's caregiver Ms. Johnny Bridge at bedside.  Disposition Plan:  home in medically stable    Consultants:   Cardiology   Procedures:    Foley catheter    Antibiotics:   IV Rocephin and azithromycin 12/23-12/24    Subjective:  states that he's feeling somewhat better than yesterday. Had cough and some dyspnea when he woke up but has since improved. Cough had mild pink frothy sputum. Denies chest pain.    Objective: Filed Vitals:   05/06/13 1602 05/06/13 2109 05/07/13 0706 05/07/13 1018  BP: 135/51 120/40 172/82 133/68  Pulse: 60 75 94 75  Temp: 100 F (37.8 C) 101.1 F (38.4 C) 98.7 F (37.1 C) 97.8 F (36.6 C)  TempSrc: Oral Oral Oral Oral  Resp: 19 18 18    Height:      Weight:   89.54 kg (197 lb 6.4 oz)   SpO2: 92% 94% 93% 99%    Intake/Output Summary (Last 24 hours) at 05/07/13 1310 Last data filed at 05/07/13 0859  Gross per 24 hour  Intake    360 ml  Output   1525 ml  Net  -1165 ml   Filed Weights   05/05/13 1750 05/06/13 0630 05/07/13 0706  Weight: 90.1 kg (198 lb 10.2 oz) 91.9 kg (202 lb 9.6 oz) 89.54 kg (197 lb 6.4 oz)     Exam:  General exam:  elderly pleasant male lying comfortably supine in bed  Respiratory system:  occasional basal crackles but otherwise clear to auscultation . No increased work of breathing. Cardiovascular system: S1 & S2 heard, RRR. No JVD, murmurs, gallops, clicks  or pedal edema. telemetry:  sinus rhythm/AV paced rhythm.  Gastrointestinal system: Abdomen is nondistended, soft and nontender. Normal bowel sounds heard. Central nervous system: Alert and oriented. No focal neurological deficits. Extremities: Symmetric 5 x 5 power.   Data Reviewed: Basic Metabolic Panel:  Recent Labs Lab 05/05/13 1351 05/06/13 0347 05/07/13 0930  NA 138 136 134*  K 4.2 4.0 4.2  CL 97 97 94*  CO2  30 29 25   GLUCOSE 138* 186* 268*  BUN 33* 36* 40*  CREATININE 1.72* 1.92* 1.90*  CALCIUM 9.3 8.5 8.7   Liver Function Tests:  Recent Labs Lab 05/06/13 0347  AST 39*  ALT 13  ALKPHOS 52  BILITOT 0.3  PROT 6.8  ALBUMIN 3.1*   No results found for this basename: LIPASE, AMYLASE,  in the last 168 hours No results found for this basename: AMMONIA,  in the last 168 hours CBC:  Recent Labs Lab 05/05/13 1351 05/06/13 0347 05/07/13 0930  WBC 7.1 8.8 6.7  NEUTROABS 5.3  --   --   HGB 12.2* 11.0* 12.3*  HCT 36.0* 32.6* 36.2*  MCV 97.3 96.7 96.0  PLT 129* 148* 145*   Cardiac Enzymes:  Recent Labs Lab 05/05/13 2230 05/06/13 0348 05/06/13 0940  TROPONINI <0.30 0.43* 0.58*   BNP (last 3 results)  Recent Labs  11/21/12 2045 12/01/12 0410 05/06/13 0348  PROBNP 1134.0* 1354.0* 5209.0*   CBG:  Recent Labs Lab 05/06/13 1141 05/06/13 1605 05/06/13 2105 05/07/13 0540 05/07/13 1111  GLUCAP 196* 166* 243* 191* 252*    Recent Results (from the past 240 hour(s))  CULTURE, BLOOD (ROUTINE X 2)     Status: None   Collection Time    05/05/13  3:22 PM      Result Value Range Status   Specimen Description BLOOD HAND LEFT   Final   Special Requests BOTTLES DRAWN AEROBIC ONLY 10CC   Final   Culture  Setup Time     Final   Value: 05/05/2013 22:51     Performed at [facility]   Culture     Final   Value:        BLOOD CULTURE RECEIVED NO GROWTH TO DATE CULTURE WILL BE HELD FOR 5 DAYS BEFORE ISSUING A FINAL NEGATIVE REPORT     Performed at [facility]   Report Status PENDING   Incomplete  CULTURE, BLOOD (ROUTINE X 2)     Status: None   Collection Time    05/05/13  5:05 PM      Result Value Range Status   Specimen Description BLOOD ARM RIGHT   Final   Special Requests BOTTLES DRAWN AEROBIC ONLY 5CC   Final   Culture  Setup Time     Final   Value: 05/05/2013 22:51     Performed at [facility]   Culture     Final   Value:        BLOOD CULTURE  RECEIVED NO GROWTH TO DATE CULTURE WILL BE HELD FOR 5 DAYS BEFORE ISSUING A FINAL NEGATIVE REPORT     Performed at [facility]   Report Status PENDING   Incomplete      Additional labs: 1. None     Studies: Dg Chest 2 View  05/05/2013   CLINICAL DATA:  Cough and shortness of breath.  EXAM: CHEST  2 VIEW  COMPARISON:  12/01/2012.  FINDINGS: Stable enlarged cardiac silhouette. Stable left subclavian pacemaker leads. The pulmonary vasculature and interstitial markings remain  mildly prominent. Thoracic spine degenerative changes.  IMPRESSION: No acute abnormality. Stable cardiomegaly, mild pulmonary vascular congestion and mild chronic interstitial lung disease.   Electronically Signed   By: Gordan Payment M.D.   On: 05/05/2013 15:25   Dg Chest Port 1 View  05/06/2013   CLINICAL DATA:  Shortness of breath and fever  EXAM: PORTABLE CHEST - 1 VIEW  COMPARISON:  05/05/2013  FINDINGS: Cardiac shadow is stable. A pacing device is again seen. The lungs are well aerated with evidence of vascular congestion and mild interstitial edema on the right. This is slightly increased from the prior exam.  IMPRESSION: Slight increase in the degree of vascular congestion and right-sided interstitial edema.   Electronically Signed   By: Alcide Clever M.D.   On: 05/06/2013 07:56        Scheduled Meds: . allopurinol  100 mg Oral QHS  . apixaban  2.5 mg Oral BID  . azithromycin  500 mg Intravenous Once  . dutasteride  0.5 mg Oral q morning - 10a  . ferrous sulfate  325 mg Oral QODAY  . insulin aspart  0-9 Units Subcutaneous TID WC  . insulin glargine  12 Units Subcutaneous QHS  . isosorbide mononitrate  30 mg Oral Daily  . latanoprost  1 drop Both Eyes QODAY  . losartan  100 mg Oral Daily  . metoprolol tartrate  25 mg Oral Daily  . multivitamin with minerals  1 tablet Oral Daily  . oseltamivir  30 mg Oral Daily  . polyethylene glycol  17 g Oral Daily  . sodium chloride  3 mL Intravenous Q12H  .  tamsulosin  0.4 mg Oral Daily   Continuous Infusions:   Principal Problem:   Influenza A with respiratory manifestations Active Problems:   Chronic diastolic CHF (congestive heart failure)   Hypertension   Diabetes mellitus   CKD (chronic kidney disease), stage III   Paroxysmal atrial fibrillation   Chronic anticoagulation   Pacemaker   Pneumonia    Time spent:  35 minutes     Lani Mendiola, MD, FACP, FHM. [facility] Pager [phone]  If 7PM-7AM, please contact night-coverage [email or web address] Password [email or web address] 05/07/2013, 1:10 PM    LOS: 2 days

## 2013-05-07 NOTE — Progress Notes (Signed)
Pt a/o, pt has been oob to chair x 3, pt hasn't had any c/o pain, vss, pt stable, will continue to monitor

## 2013-05-07 NOTE — Progress Notes (Addendum)
       Patient Name: [name] Date of Encounter: 05/07/2013    SUBJECTIVE:: Feels poorly. Aching and chest congestion. Has cough.  TELEMETRY:  NSR and intermittent pacing Filed Vitals:   05/06/13 0942 05/06/13 1602 05/06/13 2109 05/07/13 0706  BP: 128/48 135/51 120/40 172/82  Pulse: 117 60 75 94  Temp:  100 F (37.8 C) 101.1 F (38.4 C) 98.7 F (37.1 C)  TempSrc:  Oral Oral Oral  Resp:  19 18 18   Height:      Weight:    197 lb 6.4 oz (89.54 kg)  SpO2: 100% 92% 94% 93%    Intake/Output Summary (Last 24 hours) at 05/07/13 0839 Last data filed at 05/07/13 0749  Gross per 24 hour  Intake    710 ml  Output   1525 ml  Net   -815 ml    LABS: Basic Metabolic Panel:  Recent Labs  19/14/78 1351 05/06/13 0347  NA 138 136  K 4.2 4.0  CL 97 97  CO2 30 29  GLUCOSE 138* 186*  BUN 33* 36*  CREATININE 1.72* 1.92*  CALCIUM 9.3 8.5   CBC:  Recent Labs  05/05/13 1351 05/06/13 0347  WBC 7.1 8.8  NEUTROABS 5.3  --   HGB 12.2* 11.0*  HCT 36.0* 32.6*  MCV 97.3 96.7  PLT 129* 148*   Cardiac Enzymes:  Recent Labs  05/05/13 2230 05/06/13 0348 05/06/13 0940  TROPONINI <0.30 0.43* 0.58*     Radiology/Studies:  No new data  Physical Exam: Blood pressure 172/82, pulse 94, temperature 98.7 F (37.1 C), temperature source Oral, resp. rate 18, height 5\' 7"  (1.702 m), weight 197 lb 6.4 oz (89.54 kg), SpO2 93.00%. Weight change:    Chest is clear Cardiac exam is unremarkable No edema  ASSESSMENT: 1. Influenza A 2. Chronic diastolic HF, with creat bump yesterday. He is very volume sensitive both ways 3. Elderly and frail. 4. SVT has not recurred  Plan:  1. Hold diuretic today. Also hold Potassium 2, Strict I/O 3. Resume oral diuretic regimen based on I/O. Should try to keep I=O  Signed, Lesleigh Noe 05/07/2013, 8:39 AM

## 2013-05-07 NOTE — Progress Notes (Signed)
Pt continued with urinary retention for 3rd time in 24 hours. Bladder scan revealed 777 mL urine. MD paged. New orders received for Foley. Foley placed. Will continue to monitor.

## 2013-05-08 DIAGNOSIS — N179 Acute kidney failure, unspecified: Secondary | ICD-10-CM

## 2013-05-08 DIAGNOSIS — I10 Essential (primary) hypertension: Secondary | ICD-10-CM

## 2013-05-08 LAB — GLUCOSE, CAPILLARY
Glucose-Capillary: 198 mg/dL — ABNORMAL HIGH (ref 70–99)
Glucose-Capillary: 198 mg/dL — ABNORMAL HIGH (ref 70–99)

## 2013-05-08 LAB — BASIC METABOLIC PANEL
Calcium: 8.7 mg/dL (ref 8.4–10.5)
GFR calc non Af Amer: 36 mL/min — ABNORMAL LOW (ref >90)
Sodium: 138 mEq/L (ref 135–145)

## 2013-05-08 MED ORDER — OSELTAMIVIR PHOSPHATE 30 MG PO CAPS
30.0000 mg | ORAL_CAPSULE | Freq: Two times a day (BID) | ORAL | Status: DC
  Administered 2013-05-08 – 2013-05-09 (×2): 30 mg via ORAL
  Filled 2013-05-08 (×4): qty 1

## 2013-05-08 NOTE — Progress Notes (Signed)
SUBJECTIVE:  Still has productive cough  OBJECTIVE:   Vitals:   Filed Vitals:   05/07/13 1018 05/07/13 2013 05/08/13 0517 05/08/13 1047  BP: 133/68 149/59 129/56 146/64  Pulse: 75 72 77 65  Temp: 97.8 F (36.6 C) 98.5 F (36.9 C) 99.2 F (37.3 C)   TempSrc: Oral Oral Oral   Resp:  18 18   Height:      Weight:   199 lb (90.266 kg)   SpO2: 99% 96% 98%    I&O's:   Intake/Output Summary (Last 24 hours) at 05/08/13 1102 Last data filed at 05/08/13 1610  Gross per 24 hour  Intake    820 ml  Output   1350 ml  Net   -530 ml   TELEMETRY: Reviewed telemetry pt in NSR:     PHYSICAL EXAM General: Well developed, well nourished, in no acute distress Head: Eyes PERRLA, No xanthomas.   Normal cephalic and atramatic  Lungs:   Coarse BS bilaterally at bases with coughing Heart:   HRRR S1 S2 Pulses are 2+ & equal. Abdomen: Bowel sounds are positive, abdomen soft and non-tender without masses  Extremities:   No clubbing, cyanosis or edema.  DP +1 Neuro: Alert and oriented X 3. Psych:  Good affect, responds appropriately   LABS: Basic Metabolic Panel:  Recent Labs  96/04/54 0930 05/08/13 0545  NA 134* 138  K 4.2 3.8  CL 94* 99  CO2 25 30  GLUCOSE 268* 186*  BUN 40* 36*  CREATININE 1.90* 1.60*  CALCIUM 8.7 8.7   Liver Function Tests:  Recent Labs  05/06/13 0347  AST 39*  ALT 13  ALKPHOS 52  BILITOT 0.3  PROT 6.8  ALBUMIN 3.1*   No results found for this basename: LIPASE, AMYLASE,  in the last 72 hours CBC:  Recent Labs  05/05/13 1351 05/06/13 0347 05/07/13 0930  WBC 7.1 8.8 6.7  NEUTROABS 5.3  --   --   HGB 12.2* 11.0* 12.3*  HCT 36.0* 32.6* 36.2*  MCV 97.3 96.7 96.0  PLT 129* 148* 145*   Cardiac Enzymes:  Recent Labs  05/05/13 2230 05/06/13 0348 05/06/13 0940  TROPONINI <0.30 0.43* 0.58*   BNP: No components found with this basename: POCBNP,  D-Dimer: No results found for this basename: DDIMER,  in the last 72 hours Hemoglobin A1C: No  results found for this basename: HGBA1C,  in the last 72 hours Fasting Lipid Panel: No results found for this basename: CHOL, HDL, LDLCALC, TRIG, CHOLHDL, LDLDIRECT,  in the last 72 hours Thyroid Function Tests: No results found for this basename: TSH, T4TOTAL, FREET3, T3FREE, THYROIDAB,  in the last 72 hours Anemia Panel: No results found for this basename: VITAMINB12, FOLATE, FERRITIN, TIBC, IRON, RETICCTPCT,  in the last 72 hours Coag Panel:   Lab Results  Component Value Date   INR 1.01 08/21/2012   INR 0.98 08/18/2012   INR 1.00 02/29/2012    RADIOLOGY: Dg Chest 2 View  05/05/2013   CLINICAL DATA:  Cough and shortness of breath.  EXAM: CHEST  2 VIEW  COMPARISON:  12/01/2012.  FINDINGS: Stable enlarged cardiac silhouette. Stable left subclavian pacemaker leads. The pulmonary vasculature and interstitial markings remain mildly prominent. Thoracic spine degenerative changes.  IMPRESSION: No acute abnormality. Stable cardiomegaly, mild pulmonary vascular congestion and mild chronic interstitial lung disease.   Electronically Signed   By: Gordan Payment M.D.   On: 05/05/2013 15:25   Ct Lumbar Spine Wo Contrast  05/01/2013   CLINICAL  DATA:  Spinal stenosis without neurogenic claudication. Back surgery 20 years ago  EXAM: CT LUMBAR SPINE WITHOUT CONTRAST  TECHNIQUE: Multidetector CT imaging of the lumbar spine was performed without intravenous contrast administration. Multiplanar CT image reconstructions were also generated.  COMPARISON:  Lumbar myelogram with CT 11/30/2010  FINDINGS: Solid interbody fusion L2-3. Grade 1 anterior slip L4-5 is similar to the prior study. Posterior laminectomy and decompression L2 through L5 as noted previously. Negative for fracture or mass. Atherosclerotic aorta without aneurysm. No retroperitoneal mass identified.  T11-12:  Disc and facet degeneration with mild spinal stenosis  T12-L1:  Disc bulging.  Bilateral facet hypertrophy without stenosis  L1-2: Diffuse disc  bulging and spondylosis. Moderate facet hypertrophy and mild spinal stenosis.  L2-3: Solid interbody fusion. Spondylosis. Posterior decompression in the midline with bilateral facet hypertrophy. Moderate foraminal encroachment bilaterally due to spurring. No change from the prior study.  L3-4: Disc degeneration and spondylosis. Moderate facet hypertrophy. Small laminectomy in the midline. Bilateral facet hypertrophy and spondylosis contribute to mild spinal stenosis. There is lateral recess and foraminal stenosis bilaterally. Foraminal stenosis most severe on the left and without interval change.  L4-5: Grade 1 anterior slip. Bilateral facet hypertrophy. Decompressive laminectomy in the midline. Mild spinal stenosis. Moderate foraminal stenosis bilaterally due to spurring  L5-S1: Disc degeneration and spondylosis. Moderate facet hypertrophy bilaterally. Marked foraminal encroachment bilaterally due to bony overgrowth, similar to the prior study.  IMPRESSION: Solid interbody fusion L2-3.  Posterior decompression L2 through L5.  Multilevel spondylosis and facet hypertrophy throughout the lumbar spine causing foraminal encroachment at multiple levels. Overall no change from the myelogram of 11/30/2010   Electronically Signed   By: Marlan Palau M.D.   On: 05/01/2013 07:39   Dg Chest Port 1 View  05/06/2013   CLINICAL DATA:  Shortness of breath and fever  EXAM: PORTABLE CHEST - 1 VIEW  COMPARISON:  05/05/2013  FINDINGS: Cardiac shadow is stable. A pacing device is again seen. The lungs are well aerated with evidence of vascular congestion and mild interstitial edema on the right. This is slightly increased from the prior exam.  IMPRESSION: Slight increase in the degree of vascular congestion and right-sided interstitial edema.   Electronically Signed   By: Alcide Clever M.D.   On: 05/06/2013 07:56    ASSESSMENT:  1. Influenza A  2. Chronic diastolic HF, with creat bump yesterday. He is very volume sensitive  both ways  3. Elderly and frail.  4. SVT has not recurred   Plan:  1. Hold diuretic today. Also hold Potassium today 2, Strict I/O  3. Resume oral diuretic regimen based on I/O. Should try to keep I=O        Quintella Reichert, MD  05/08/2013  11:02 AM

## 2013-05-08 NOTE — Progress Notes (Signed)
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 He denied chest pain or dyspnea. He was admitted for evaluation and management of suspected pneumonia/influenza.  Assessment/Plan:  Influenza A - Patient was admitted to telemetry and started empirically on IV Rocephin, azithromycin for presumed pneumonia and Tamiflu adjusted to renal functions. - Repeat chest x-ray suggests slight increase in degree of vascular congestion and right-sided interstitial edema. - Blood cultures x2: Negative to date - Influenza panel PCR positive for influenza A. DC'ed antibiotics. Continue Tamiflu. Clinically less likely to be pneumonia in the absence of productive cough, leukocytosis and negative radiology. - Clinically improving.   Acute on Chronic diastolic CHF - Chest x-ray suggested slightly increased volume overload. He apparently has propensity to decompensate easily. - Cardiology consultation appreciated and gave him IV Lasix on 12/24. Diuretics management per cardiology- being held by cardiology secondary to bump in his creatinine-however creatinine has been stable over the last 48 hours. - Mildly elevated troponin, most likely secondary to demand ischemia from febrile illness. Management per cardiology  -  strict input output charting.   Chronic kidney disease stage III  - creatinine close to baseline. Monitor BMP daily.  Type II DM - Continue Lantus and SSI -Mildly uncontrolled   Hypertension  -Controlled. Continue home medications  Anemia and thrombocytopenia   - follow CBC in a.m. May be due to acute febrile illness from influenza - Stable    Paroxysmal A. fib/PPM  - AV paced on monitor. Continue metoprolol and anticoagulation.     Urinary retention -  urinary catheter placed last night. Discussed with Dr. Brunilda Payor 12/26 were recommended DC Foley catheter and voiding trial and if he fails same, reinsert Foley catheter and discharge with outpatient followup with him.   Code Status:  full  Family Communication:  None at bedside Disposition Plan:  home when medically stable    Consultants:   Cardiology   Procedures:    Foley catheter    Antibiotics:   IV Rocephin and azithromycin 12/23-12/24    Subjective: States that he continues to feel better. Denies dyspnea. Intermittent mild but nonproductive cough. Wishes to know when his diuretics will be started.  Objective: Filed Vitals:   05/07/13 2013 05/08/13 0517 05/08/13 1047 05/08/13 1440  BP: 149/59 129/56 146/64 135/83  Pulse: 72 77 65 71  Temp: 98.5 F (36.9 C) 99.2 F (37.3 C)  98.4 F (36.9 C)  TempSrc: Oral Oral  Oral  Resp: 18 18  20   Height:      Weight:  90.266 kg (199 lb)    SpO2: 96% 98%  99%    Intake/Output Summary (Last 24 hours) at 05/08/13 1455 Last data filed at 05/08/13 1250  Gross per 24 hour  Intake    580 ml  Output   1200 ml  Net   -620 ml   Filed Weights   05/06/13 0630 05/07/13 0706 05/08/13 0517  Weight: 91.9 kg (202 lb 9.6 oz) 89.54 kg (197 lb 6.4 oz) 90.266 kg (199 lb)     Exam:  General exam:  elderly pleasant male lying comfortably supine in bed  Respiratory system:  occasional basal crackles but otherwise clear to auscultation . No increased work of breathing. Cardiovascular system: S1 & S2 heard, RRR. No JVD, murmurs, gallops,  clicks or pedal edema. telemetry:  sinus rhythm/AV paced rhythm.  Gastrointestinal system: Abdomen is nondistended, soft and nontender. Normal bowel sounds heard. Central nervous system: Alert and oriented. No focal neurological deficits. Extremities: Symmetric 5 x 5 power.   Data Reviewed: Basic Metabolic Panel:  Recent Labs Lab 05/05/13 1351 05/06/13 0347 05/07/13 0930  05/08/13 0545  NA 138 136 134* 138  K 4.2 4.0 4.2 3.8  CL 97 97 94* 99  CO2 30 29 25 30   GLUCOSE 138* 186* 268* 186*  BUN 33* 36* 40* 36*  CREATININE 1.72* 1.92* 1.90* 1.60*  CALCIUM 9.3 8.5 8.7 8.7   Liver Function Tests:  Recent Labs Lab 05/06/13 0347  AST 39*  ALT 13  ALKPHOS 52  BILITOT 0.3  PROT 6.8  ALBUMIN 3.1*   No results found for this basename: LIPASE, AMYLASE,  in the last 168 hours No results found for this basename: AMMONIA,  in the last 168 hours CBC:  Recent Labs Lab 05/05/13 1351 05/06/13 0347 05/07/13 0930  WBC 7.1 8.8 6.7  NEUTROABS 5.3  --   --   HGB 12.2* 11.0* 12.3*  HCT 36.0* 32.6* 36.2*  MCV 97.3 96.7 96.0  PLT 129* 148* 145*   Cardiac Enzymes:  Recent Labs Lab 05/05/13 2230 05/06/13 0348 05/06/13 0940  TROPONINI <0.30 0.43* 0.58*   BNP (last 3 results)  Recent Labs  11/21/12 2045 12/01/12 0410 05/06/13 0348  PROBNP 1134.0* 1354.0* 5209.0*   CBG:  Recent Labs Lab 05/07/13 1111 05/07/13 1602 05/07/13 2059 05/08/13 0646 05/08/13 1027  GLUCAP 252* 208* 192* 184* 198*    Recent Results (from the past 240 hour(s))  CULTURE, BLOOD (ROUTINE X 2)     Status: None   Collection Time    05/05/13  3:22 PM      Result Value Range Status   Specimen Description BLOOD HAND LEFT   Final   Special Requests BOTTLES DRAWN AEROBIC ONLY 10CC   Final   Culture  Setup Time     Final   Value: 05/05/2013 22:51     Performed at [facility]   Culture     Final   Value:        BLOOD CULTURE RECEIVED NO GROWTH TO DATE CULTURE WILL BE HELD FOR 5 DAYS BEFORE ISSUING A FINAL NEGATIVE REPORT     Performed at [facility]   Report Status PENDING   Incomplete  CULTURE, BLOOD (ROUTINE X 2)     Status: None   Collection Time    05/05/13  5:05 PM      Result Value Range Status   Specimen Description BLOOD ARM RIGHT   Final   Special Requests BOTTLES DRAWN AEROBIC ONLY 5CC   Final   Culture  Setup Time     Final   Value:  05/05/2013 22:51     Performed at [facility]   Culture     Final   Value:        BLOOD CULTURE RECEIVED NO GROWTH TO DATE CULTURE WILL BE HELD FOR 5 DAYS BEFORE ISSUING A FINAL NEGATIVE REPORT     Performed at [facility]   Report Status PENDING   Incomplete      Additional labs: 1. None     Studies: No results found.      Scheduled Meds: . allopurinol  100 mg Oral QHS  . apixaban  2.5 mg Oral BID  . dutasteride  0.5 mg Oral q  morning - 10a  . ferrous sulfate  325 mg Oral QODAY  . insulin aspart  0-9 Units Subcutaneous TID WC  . insulin glargine  12 Units Subcutaneous QHS  . isosorbide mononitrate  30 mg Oral Daily  . latanoprost  1 drop Both Eyes QODAY  . losartan  100 mg Oral Daily  . metoprolol tartrate  25 mg Oral Daily  . multivitamin with minerals  1 tablet Oral Daily  . oseltamivir  30 mg Oral BID  . polyethylene glycol  17 g Oral Daily  . sodium chloride  3 mL Intravenous Q12H  . tamsulosin  0.4 mg Oral Daily   Continuous Infusions:   Principal Problem:   Influenza A with respiratory manifestations Active Problems:   Chronic diastolic CHF (congestive heart failure)   Hypertension   Diabetes mellitus   CKD (chronic kidney disease), stage III   Paroxysmal atrial fibrillation   Chronic anticoagulation   Pacemaker   Pneumonia    Time spent:  35 minutes     Reace Breshears, MD, FACP, FHM. [facility] Pager [phone]  If 7PM-7AM, please contact night-coverage [email or web address] Password [email or web address] 05/08/2013, 2:55 PM    LOS: 3 days

## 2013-05-09 DIAGNOSIS — Z950 Presence of cardiac pacemaker: Secondary | ICD-10-CM

## 2013-05-09 DIAGNOSIS — I4891 Unspecified atrial fibrillation: Secondary | ICD-10-CM

## 2013-05-09 LAB — GLUCOSE, CAPILLARY
Glucose-Capillary: 130 mg/dL — ABNORMAL HIGH (ref 70–99)
Glucose-Capillary: 237 mg/dL — ABNORMAL HIGH (ref 70–99)

## 2013-05-09 LAB — CBC
Hemoglobin: 10.5 g/dL — ABNORMAL LOW (ref 13.0–17.0)
MCHC: 34.4 g/dL (ref 30.0–36.0)
Platelets: 131 10*3/uL — ABNORMAL LOW (ref 150–400)
RBC: 3.19 MIL/uL — ABNORMAL LOW (ref 4.22–5.81)

## 2013-05-09 LAB — BASIC METABOLIC PANEL
GFR calc Af Amer: 48 mL/min — ABNORMAL LOW (ref >90)
GFR calc non Af Amer: 42 mL/min — ABNORMAL LOW (ref >90)
Glucose, Bld: 159 mg/dL — ABNORMAL HIGH (ref 70–99)
Potassium: 3.7 mEq/L (ref 3.5–5.1)
Sodium: 135 mEq/L (ref 135–145)

## 2013-05-09 MED ORDER — TORSEMIDE 20 MG PO TABS
20.0000 mg | ORAL_TABLET | Freq: Three times a day (TID) | ORAL | Status: DC
  Filled 2013-05-09 (×2): qty 1

## 2013-05-09 MED ORDER — GUAIFENESIN-DM 100-10 MG/5ML PO SYRP: 5.0000 mL | ORAL_SOLUTION | ORAL | Status: DC | PRN

## 2013-05-09 MED ORDER — OSELTAMIVIR PHOSPHATE 30 MG PO CAPS: 30.0000 mg | ORAL_CAPSULE | Freq: Two times a day (BID) | ORAL | Status: DC

## 2013-05-09 MED ORDER — TORSEMIDE 20 MG PO TABS
60.0000 mg | ORAL_TABLET | Freq: Two times a day (BID) | ORAL | Status: DC
  Filled 2013-05-09 (×2): qty 3

## 2013-05-09 NOTE — Progress Notes (Signed)
D/C Tele, D/C IV, D/C instructions reviewed with pt. And his family member present, pt. Denied any signs and symptoms of distress and verbalized understanding of D/C instructions. D/C paperwork given to pt. Pt. Left Unit Via Wheelchair, Pt. Transported home via family member.

## 2013-05-09 NOTE — Progress Notes (Addendum)
    Subjective:  Mild cough. Walked hallway. Spoke to Dr. Waymon Amato.  Objective:  Vital Signs in the last 24 hours: Temp:  [98 F (36.7 C)-98.4 F (36.9 C)] 98 F (36.7 C) (12/27 0453) Pulse Rate:  [65-71] 71 (12/27 0453) Resp:  [20-22] 20 (12/27 0453) BP: (126-146)/(59-83) 144/59 mmHg (12/27 0453) SpO2:  [99 %-100 %] 99 % (12/27 0453) Weight:  [199 lb 4.7 oz (90.4 kg)] 199 lb 4.7 oz (90.4 kg) (12/27 0700)  Intake/Output from previous day: 12/26 0701 - 12/27 0700 In: 720 [P.O.:480; I.V.:240] Out: 750 [Urine:750]   Physical Exam: General: Well developed, well nourished, in no acute distress. Head:  Normocephalic and atraumatic. Lungs: Minimal rhonchi. Heart: Normal S1 and S2.  No murmur, rubs or gallops.  Abdomen: soft, non-tender, positive bowel sounds. Extremities: No clubbing or cyanosis. No edema. Neurologic: Alert and oriented x 3.    Lab Results:  Recent Labs  05/07/13 0930 05/09/13 0415  WBC 6.7 3.7*  HGB 12.3* 10.5*  PLT 145* 131*    Recent Labs  05/08/13 0545 05/09/13 0415  NA 138 135  K 3.8 3.7  CL 99 97  CO2 30 30  GLUCOSE 186* 159*  BUN 36* 31*  CREATININE 1.60* 1.41*   Telemetry: SR occasional PACED. Personally viewed.    Assessment/Plan:  Principal Problem:   Influenza A with respiratory manifestations Active Problems:   Chronic diastolic CHF (congestive heart failure)   Hypertension   Diabetes mellitus   CKD (chronic kidney disease), stage III   Paroxysmal atrial fibrillation   Chronic anticoagulation   Pacemaker   Pneumonia  1) FLU - improved. Cough. Per primary team  2) Chronic diastolic HF - Dr. Waymon Amato is ready to send home. He walked hallway OK. Appears OK to go home. Resume home torsemide dose 60mg  BID. Daily weights. I will try to set up appt with Dr. Katrinka Blazing in next 7-14 days. Check BMET at that time. PO intake has improved.   3) Pacer - functions well  4) AFIB - PAF  5) Chronic anticoag. Eliquis.  [name],  [name] 05/09/2013, 10:13 AM

## 2013-05-09 NOTE — Discharge Summary (Signed)
Physician Discharge Summary  [name] VHQ:[id number] DOB: 09-17-1918 DOA: 05/05/2013  PCP: [name], MD  Admit date: 05/05/2013 Discharge date: 05/09/2013  Time spent: Greater than 30 minutes  Recommendations for Outpatient Follow-up:  1. Dr. Verdis Prime, Cardiology in one week with repeat labs (CBC & BMP) 2. Dr. Margaretmary Bayley, PCP in 2 weeks 3. Dr. Ezzie Dural, Urology  4. Followup final blood culture results that were drawn in the hospital.  Discharge Diagnoses:  Principal Problem:   Influenza A with respiratory manifestations Active Problems:   Chronic diastolic CHF (congestive heart failure)   Hypertension   Diabetes mellitus   CKD (chronic kidney disease), stage III   Paroxysmal atrial fibrillation   Chronic anticoagulation   Pacemaker   Pneumonia   Discharge Condition: Improved & Stable  Diet recommendation: Heart healthy and diabetic diet.  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 He denied chest pain or dyspnea. He was admitted for evaluation and management of suspected pneumonia/influenza.  Hospital Course:   Influenza A  - Patient was admitted to telemetry and started empirically on IV Rocephin, azithromycin for presumed pneumonia and Tamiflu adjusted to renal functions.  - Repeat chest x-ray suggested slight increase in degree of vascular congestion and right-sided interstitial edema.  - Blood cultures x2: Negative to date  - Influenza panel PCR positive for influenza A. DC'ed antibiotics. Continue Tamiflu. Clinically less likely to be pneumonia in the absence of productive cough, leukocytosis and negative radiology.  - Clinically improving.   Acute on  Chronic diastolic CHF  - Chest x-ray suggested slightly increased volume overload. He apparently has propensity to decompensate easily.  - Cardiology consultation appreciated and gave him IV Lasix on 12/24. Diuretics management per cardiology- subsequently diuretics were held for 2 days due to elevated creatinine. - Mildly elevated troponin, most likely secondary to demand ischemia from febrile illness. Management per cardiology  - Cardiology has seen him today and recommended discharging him on his prior home dose of torsemide 60 mg twice a day with close outpatient followup with BMP. Creatinine has improved to 1.41. Baseline Creatinine probably 1.6. Some of his elevated creatinine could have been due to her urinary retention.  Chronic kidney disease stage III  - creatinine close to baseline.   Type II DM  - Continue Lantus and SSI  - Fluctuating CBGs. Outpatient management by his PCP/endocrinologist.  Hypertension  -Controlled. Continue home medications   Anemia/pancytopenia  - May be due to acute febrile illness from influenza  - Stable. Follow CBCs closely as outpatient.   Paroxysmal A. fib/PPM  - AV paced on monitor. Continue metoprolol and anticoagulation.   Urinary retention  - Patient developed urinary retention 2 days back and initially was intermittently catheterized and eventually placed on Foley catheter for approximately 24 hours. Discussed with his primary urologist and discontinued Foley catheter on 12/26. Since then patient has been able to void without difficulty. Outpatient followup with urology.   Consultations:  Cardiology  Procedures:  Foley catheter    Discharge Exam:  Complaints:  Feels much better. Denies dyspnea. No difficulty voiding urine. Has had BMs. Minimal cough, mostly in the mornings with pale yellowish sputum. Denies any other  complaints.  Filed Vitals:   05/08/13 2031 05/09/13 0453 05/09/13 0700 05/09/13 1122  BP: 126/60 144/59  138/56   Pulse: 65 71  71  Temp: 98 F (36.7 C) 98 F (36.7 C)  98.3 F (36.8 C)  TempSrc: Oral Oral  Oral  Resp: 22 20    Height:      Weight:   90.4 kg (199 lb 4.7 oz)   SpO2: 100% 99%  95%    General exam: elderly pleasant male lying comfortably supine in bed  Respiratory system: occasional basal crackles but otherwise clear to auscultation . No increased work of breathing.  Cardiovascular system: S1 & S2 heard, RRR. No JVD, murmurs, gallops, clicks or pedal edema. telemetry: sinus rhythm/ paced rhythm.  Gastrointestinal system: Abdomen is nondistended, soft and nontender. Normal bowel sounds heard.  Central nervous system: Alert and oriented. No focal neurological deficits.  Extremities: Symmetric 5 x 5 power.   Discharge Instructions      Discharge Orders   Future Appointments Provider Department Dept Phone   07/08/2013 11:00 AM Hillis Range, MD [facility] [place] Office [phone]   07/17/2013 9:00 AM Lesleigh Noe, MD [facility] [phone]   Future Orders Complete By Expires   (HEART FAILURE PATIENTS) Call MD:  Anytime you have any of the following symptoms: 1) 3 pound weight gain in 24 hours or 5 pounds in 1 week 2) shortness of breath, with or without a dry hacking cough 3) swelling in the hands, feet or stomach 4) if you have to sleep on extra pillows at night in order to breathe.  As directed    Call MD for:  difficulty breathing, headache or visual disturbances  As directed    Call MD for:  extreme fatigue  As directed    Call MD for:  persistant dizziness or light-headedness  As directed    Call MD for:  severe uncontrolled pain  As directed    Call MD for:  temperature >100.4  As directed    Diet - low sodium heart healthy  As directed    Diet Carb Modified  As directed    Increase activity slowly  As directed        Medication List         allopurinol 100 MG tablet  Commonly known as:  ZYLOPRIM  Take 100 mg by mouth at bedtime.      apixaban 2.5 MG Tabs tablet  Commonly known as:  ELIQUIS  Take 1 tablet (2.5 mg total) by mouth 2 (two) times daily.     dutasteride 0.5 MG capsule  Commonly known as:  AVODART  Take 0.5 mg by mouth every morning.     ferrous sulfate 325 (65 FE) MG tablet  Take 325 mg by mouth every other day.     guaiFENesin-dextromethorphan 100-10 MG/5ML syrup  Commonly known as:  ROBITUSSIN DM  Take 5 mLs by mouth every 4 (four) hours as needed for cough.     insulin glargine 100 UNIT/ML injection  Commonly known as:  LANTUS  Inject 12 Units into the skin at bedtime.     insulin lispro 100 UNIT/ML injection  Commonly known as:  HUMALOG  Inject 9-10 Units into the skin 3 (three) times daily before meals. 9 units with breakfast and lunch & 9 units after supper     isosorbide mononitrate 30 MG 24 hr tablet  Commonly known as:  IMDUR  Take 30 mg by mouth daily.  Lactulose Soln  - Take 15cc by mouth two times daily for constipation. If no effect in 3 days:  - Increase to 30cc by mouth two times daily. If diarrhea develops decrease the   - Dose immediately.     losartan 100 MG tablet  Commonly known as:  COZAAR  Take 100 mg by mouth daily.     metoprolol tartrate 25 MG tablet  Commonly known as:  LOPRESSOR  Take 25 mg by mouth daily.     multivitamin with minerals Tabs tablet  Take 1 tablet by mouth daily. Diabetic pak by Ashby Dawes from [facility]     oseltamivir 30 MG capsule  Commonly known as:  TAMIFLU  Take 1 capsule (30 mg total) by mouth 2 (two) times daily.     polyethylene glycol packet  Commonly known as:  MIRALAX / GLYCOLAX  Take 17 g by mouth daily as needed. For constipation     potassium chloride SA 20 MEQ tablet  Commonly known as:  K-DUR,KLOR-CON  Take 20 mEq by mouth daily.     REFRESH OP  Place 1 drop into both eyes 2 (two) times daily. Refresh Gel Opth     silodosin 8 MG Caps capsule  Commonly known as:  RAPAFLO  Take 8 mg by mouth daily with breakfast.      SYSTANE 0.4-0.3 % Soln  Generic drug:  Polyethyl Glycol-Propyl Glycol  Apply 1 drop to eye daily. Into affected eyes     torsemide 20 MG tablet  Commonly known as:  DEMADEX  Take 60 mg by mouth 2 (two) times daily.     ZIOPTAN 0.0015 % Soln  Generic drug:  Tafluprost  Place 1 drop into both eyes every other day. Use at night       Follow-up Information   Follow up with [name], MD. Schedule an appointment as soon as possible for a visit in 2 weeks.   Specialty:  Internal Medicine   Contact information:   [address] [place] [address] [phone]       Follow up with Lesleigh Noe, MD. Schedule an appointment as soon as possible for a visit in 1 week. (To be seen with repeat labs (CBC & BMP))    Specialty:  Cardiology   Contact information:   [address] [place] [address] [phone]       Schedule an appointment as soon as possible for a visit with NESI,MARC-HENRY, MD.   Specialty:  Urology   Contact information:   [address] [place] [address] [phone]        The results of significant diagnostics from this hospitalization (including imaging, microbiology, ancillary and laboratory) are listed below for reference.    Significant Diagnostic Studies: Dg Chest 2 View  05/05/2013   CLINICAL DATA:  Cough and shortness of breath.  EXAM: CHEST  2 VIEW  COMPARISON:  12/01/2012.  FINDINGS: Stable enlarged cardiac silhouette. Stable left subclavian pacemaker leads. The pulmonary vasculature and interstitial markings remain mildly prominent. Thoracic spine degenerative changes.  IMPRESSION: No acute abnormality. Stable cardiomegaly, mild pulmonary vascular congestion and mild chronic interstitial lung disease.   Electronically Signed   By: Gordan Payment M.D.   On: 05/05/2013 15:25   Dg Chest Port 1 View  05/06/2013   CLINICAL DATA:  Shortness of breath and fever   EXAM: PORTABLE CHEST - 1 VIEW  COMPARISON:  05/05/2013  FINDINGS: Cardiac shadow is stable. A pacing device is again seen. The lungs are well aerated with evidence of vascular congestion and mild interstitial edema on the right. This is slightly increased from the prior exam.  IMPRESSION: Slight increase in the degree of vascular congestion and right-sided interstitial edema.   Electronically Signed   By: Alcide Clever M.D.   On: 05/06/2013 07:56    Microbiology: Recent Results (from the past 240 hour(s))  CULTURE, BLOOD (ROUTINE X 2)     Status: None   Collection Time    05/05/13  3:22 PM      Result Value Range Status   Specimen Description BLOOD HAND LEFT   Final   Special Requests BOTTLES DRAWN AEROBIC ONLY 10CC   Final   Culture  Setup Time     Final   Value: 05/05/2013 22:51     Performed at [facility]   Culture     Final   Value:        BLOOD CULTURE RECEIVED NO GROWTH TO DATE CULTURE WILL BE HELD FOR 5 DAYS BEFORE ISSUING A FINAL NEGATIVE REPORT     Performed at [facility]   Report Status PENDING   Incomplete  CULTURE, BLOOD (ROUTINE X 2)     Status: None   Collection Time    05/05/13  5:05 PM      Result Value Range Status   Specimen Description BLOOD ARM RIGHT   Final   Special Requests BOTTLES DRAWN AEROBIC ONLY 5CC   Final   Culture  Setup Time     Final   Value: 05/05/2013 22:51     Performed at [facility]   Culture     Final   Value:        BLOOD CULTURE RECEIVED NO GROWTH TO DATE CULTURE WILL BE HELD FOR 5 DAYS BEFORE ISSUING A FINAL NEGATIVE REPORT     Performed at [facility]   Report Status PENDING   Incomplete     Labs: Basic Metabolic Panel:  Recent Labs Lab 05/05/13 1351 05/06/13 0347 05/07/13 0930 05/08/13 0545 05/09/13 0415  NA 138 136 134* 138 135  K 4.2 4.0 4.2 3.8 3.7  CL 97 97 94* 99 97  CO2 30 29 25 30 30   GLUCOSE 138* 186* 268* 186* 159*  BUN 33* 36* 40* 36* 31*  CREATININE 1.72* 1.92* 1.90* 1.60*  1.41*  CALCIUM 9.3 8.5 8.7 8.7 8.6   Liver Function Tests:  Recent Labs Lab 05/06/13 0347  AST 39*  ALT 13  ALKPHOS 52  BILITOT 0.3  PROT 6.8  ALBUMIN 3.1*   No results found for this basename: LIPASE, AMYLASE,  in the last 168 hours No results found for this basename: AMMONIA,  in the last 168 hours CBC:  Recent Labs Lab 05/05/13 1351 05/06/13 0347 05/07/13 0930 05/09/13 0415  WBC 7.1 8.8 6.7 3.7*  NEUTROABS 5.3  --   --   --   HGB 12.2* 11.0* 12.3* 10.5*  HCT 36.0* 32.6* 36.2* 30.5*  MCV 97.3 96.7 96.0 95.6  PLT 129* 148* 145* 131*   Cardiac Enzymes:  Recent Labs Lab 05/05/13 2230 05/06/13 0348 05/06/13 0940  TROPONINI <0.30 0.43* 0.58*   BNP: BNP (last 3 results)  Recent Labs  11/21/12 2045 12/01/12 0410 05/06/13 0348  PROBNP 1134.0* 1354.0* 5209.0*   CBG:  Recent Labs Lab 05/08/13 1027 05/08/13 1625 05/08/13 2125 05/09/13 0622 05/09/13 1050  GLUCAP 198* 198* 227* 130* 237*    Additional labs: 1. Influenza panel PCR: Positive for influenza A. Negative for influenza B and H1 N1 2. Urine Legionella antigen, negative   Discussed with patient's caregiver at bedside.   Signed:  Marcellus Scott, MD, FACP, FHM. [facility] Pager [phone]  If 7PM-7AM, please contact night-coverage [email or web address] Password [email or web address] 05/09/2013, 1:15 PM

## 2013-05-09 NOTE — Progress Notes (Addendum)
SATURATION QUALIFICATIONS: (This note is used to comply with regulatory documentation for home oxygen)  Patient Saturations on Room Air at Rest =90%  Patient Saturations on Room Air while Ambulating = 100%  Patient Saturations on 3 Liters of oxygen while Ambulating = 99%  Please briefly explain why patient needs home oxygen:

## 2013-05-09 NOTE — Progress Notes (Signed)
I advised MD who was at [employer] of Patient's small amount of pink tinged sputum. MD stated he was aware and wasn't concerned unless it was large amounts and Pt. Could still go home today. Pt. Denied any reports of discomfort of distress.

## 2013-05-11 LAB — CULTURE, BLOOD (ROUTINE X 2)

## 2013-05-17 ENCOUNTER — Other Ambulatory Visit: Payer: Self-pay | Admitting: Interventional Cardiology

## 2013-05-19 ENCOUNTER — Encounter: Payer: Self-pay | Admitting: Interventional Cardiology

## 2013-05-19 ENCOUNTER — Ambulatory Visit (INDEPENDENT_AMBULATORY_CARE_PROVIDER_SITE_OTHER): Payer: Medicare Other | Admitting: Interventional Cardiology

## 2013-05-19 VITALS — BP 132/62 | HR 62 | Ht 67.0 in | Wt 190.9 lb

## 2013-05-19 DIAGNOSIS — I5032 Chronic diastolic (congestive) heart failure: Secondary | ICD-10-CM

## 2013-05-19 DIAGNOSIS — I10 Essential (primary) hypertension: Secondary | ICD-10-CM

## 2013-05-19 DIAGNOSIS — I480 Paroxysmal atrial fibrillation: Secondary | ICD-10-CM

## 2013-05-19 DIAGNOSIS — Z950 Presence of cardiac pacemaker: Secondary | ICD-10-CM

## 2013-05-19 DIAGNOSIS — I509 Heart failure, unspecified: Secondary | ICD-10-CM

## 2013-05-19 DIAGNOSIS — I4891 Unspecified atrial fibrillation: Secondary | ICD-10-CM

## 2013-05-19 LAB — CBC
HCT: 33.9 % — ABNORMAL LOW (ref 39.0–52.0)
Hemoglobin: 11.1 g/dL — ABNORMAL LOW (ref 13.0–17.0)
MCHC: 32.7 g/dL (ref 30.0–36.0)
MCV: 96.9 fl (ref 78.0–100.0)
Platelets: 269 10*3/uL (ref 150.0–400.0)
RBC: 3.5 Mil/uL — ABNORMAL LOW (ref 4.22–5.81)
RDW: 14.9 % — AB (ref 11.5–14.6)
WBC: 7.1 10*3/uL (ref 4.5–10.5)

## 2013-05-19 LAB — BASIC METABOLIC PANEL
BUN: 28 mg/dL — AB (ref 6–23)
CALCIUM: 9.5 mg/dL (ref 8.4–10.5)
CO2: 33 meq/L — AB (ref 19–32)
Chloride: 98 mEq/L (ref 96–112)
Creatinine, Ser: 1.9 mg/dL — ABNORMAL HIGH (ref 0.4–1.5)
GFR: 43.56 mL/min — ABNORMAL LOW (ref >60.00)
GLUCOSE: 145 mg/dL — AB (ref 70–99)
Potassium: 4 mEq/L (ref 3.5–5.1)
Sodium: 138 mEq/L (ref 135–145)

## 2013-05-19 NOTE — Patient Instructions (Signed)
Your physician recommends that you return for lab work today for BMET and CBC.  Your physician recommends that you follow up as scheduled.

## 2013-05-19 NOTE — Progress Notes (Signed)
Patient ID: Darrell [name], male   DOB: 1918-08-25, 80 y.o.   MRN: [id number]    [place], [place] [name], Darrell [name]  [id number] Phone: [phone] Fax:  [phone]  Date:  05/19/2013   ID:  Darrell [name], DOB 05/10/1919, MRN [id number]  PCP:  Darrell Spurling, MD      History of Present Illness: Darrell [name] is a 80 y.o. male who has had diastolic heart failure.  He was hospitalized in Dec 2014 for flu sx.  He had issues with fluid overload and renal dysfunction.  He was on less diuretic after leaving the hospital but felt fluid retention.  He is back to the Torsemide 60 mg BID.  His weight is down to baseline.  [name] shortness of breath.  [name] orthopnea.  He is walking several times a day.     His weight got to 189.5 lbs.  He was concerned that he was dehydrated as he was having cramps at night.     Wt Readings from Last 3 Encounters:  05/19/13 190 lb 14.4 oz (86.592 kg)  05/09/13 199 lb 4.7 oz (90.4 kg)  04/03/13 192 lb 12.8 oz (87.454 kg)     Past Medical History  Diagnosis Date  . CHF (congestive heart failure)   . Diabetes mellitus   . Hypertension   . Hyperlipidemia   . Coronary artery disease   . Spinal stenosis   . Dysrhythmia   . Pneumonia   . Pacemaker   . Arthritis   . Anemia     Current Outpatient Prescriptions  Medication Sig Dispense Refill  . allopurinol (ZYLOPRIM) 100 MG tablet Take 100 mg by mouth at bedtime.      [name] apixaban (ELIQUIS) 2.5 MG TABS tablet Take 1 tablet (2.5 mg total) by mouth 2 (two) times daily.  60 tablet  1  . dutasteride (AVODART) 0.5 MG capsule Take 0.5 mg by mouth every morning.       . ferrous sulfate 325 (65 FE) MG tablet Take 325 mg by mouth every other day.       . insulin glargine (LANTUS) 100 UNIT/ML injection Inject 12 Units into the skin at bedtime.       . insulin lispro (HUMALOG) 100 UNIT/ML injection Inject 9-10 Units into the skin 3 (three) times daily before meals. 9 units with breakfast and lunch & 9 units after  supper      . isosorbide mononitrate (IMDUR) 30 MG 24 hr tablet Take 30 mg by mouth daily.      [name] losartan (COZAAR) 100 MG tablet Take 100 mg by mouth daily.      . metoprolol tartrate (LOPRESSOR) 25 MG tablet take 1 tablet by mouth once daily  30 tablet  1  . Multiple Vitamin (MULTIVITAMIN WITH MINERALS) TABS Take 1 tablet by mouth daily. Diabetic pak by Petra Kuba from [employer]      . Polyethyl Glycol-Propyl Glycol (SYSTANE) 0.4-0.3 % SOLN Apply 1 drop to eye daily. Into affected eyes      . polyethylene glycol (MIRALAX / GLYCOLAX) packet Take 17 g by mouth daily as needed. For constipation      . Polyvinyl Alcohol-Povidone (REFRESH OP) Place 1 drop into both eyes 2 (two) times daily. Refresh Gel Opth      . potassium chloride SA (K-DUR,KLOR-CON) 20 MEQ tablet Take 20 mEq by mouth daily.      . silodosin (RAPAFLO) 8 MG CAPS capsule Take 8 mg by mouth daily  with breakfast.      . Tafluprost (ZIOPTAN) 0.0015 % SOLN Place 1 drop into both eyes every other day. Use at night      . torsemide (DEMADEX) 20 MG tablet Take 60 mg by mouth 2 (two) times daily. Takes 3 in the am  3 in the pm       [name] current facility-administered medications for this visit.    Allergies:    Allergies  Allergen Reactions  . Statins Other (See Comments)    rhabdomyolisis    Social History:  The patient  reports that he has never smoked. He has never used smokeless tobacco. He reports that he does not drink alcohol or use illicit drugs.   Family History:  The patient's family history includes Hypertension in his mother; Multiple myeloma in his father.   ROS:  Please see the history of present illness.  [name] nausea, vomiting.  [name] fevers, chills.  [name] focal weakness.  [name] dysuria.  All other systems reviewed and negative.   PHYSICAL EXAM: VS:  BP 132/62  Pulse 62  Ht '5\' 7"'  (1.702 m)  Wt 190 lb 14.4 oz (86.592 kg)  BMI 29.89 kg/m2 Well nourished, well developed, in [name] acute distress HEENT: normal Neck: [name] JVD, [name] carotid  bruits Cardiac:  normal S1, S2; RRR;  Lungs:  clear to auscultation bilaterally, [name] wheezing, rhonchi or rales Abd: soft, nontender, [name] hepatomegaly Ext: [name] edema Skin: warm and dry Neuro:   [name] focal abnormalities noted       ASSESSMENT AND PLAN:  1. Chronic Diastolic heart failure:  Appears euvolemic.  COntinue diuretics at the current dose.  Check BMet.  Cramps may be related to low potassium.  Reviewed hospital records.  Treated for flu as well.   2. AFib: Appears to be NSR.  Eliquis for stroke prevention. 3. HTN: BP well controlled. Continue currrent meds.  WIll have to adjust based on renal function. 4. Pacer : will arrange phone checks after he visits Dr. Rayann Heman.   Signed, Mina Marble, MD, [facility] 05/19/2013 3:05 PM

## 2013-05-20 ENCOUNTER — Telehealth: Payer: Self-pay | Admitting: Interventional Cardiology

## 2013-05-20 NOTE — Telephone Encounter (Signed)
New message       Saw dr Irish Lack yesterday, [name] wants a copy of his lab work faxed to him @ [phone]

## 2013-05-20 NOTE — Telephone Encounter (Signed)
Spoke with [name] and faxed labs to Dr. Macky Lower office.

## 2013-05-22 ENCOUNTER — Telehealth: Payer: Self-pay | Admitting: Interventional Cardiology

## 2013-05-22 NOTE — Telephone Encounter (Signed)
New message     [name] says his heart rate is 115---pacemaker was adjusted to 60 when he was in the hosp.  Want to talk to someone in the device clinic to see if it needs to be adjusted again

## 2013-05-22 NOTE — Telephone Encounter (Signed)
Heart rate 95 per patient today.  Denies any SOB.  Appointment rescheduled 06/15/13 with Dr. Rayann Heman to establish with EP.  Patient has hx of A-fib and is on eliquis.  Patient instructed if heart rates > 100bpm and is symptomatic to call the office and schedule earlier appointment.

## 2013-05-25 ENCOUNTER — Other Ambulatory Visit: Payer: Self-pay | Admitting: General Surgery

## 2013-05-25 DIAGNOSIS — Z79899 Other long term (current) drug therapy: Secondary | ICD-10-CM

## 2013-05-27 ENCOUNTER — Other Ambulatory Visit (INDEPENDENT_AMBULATORY_CARE_PROVIDER_SITE_OTHER): Payer: Medicare Other

## 2013-05-27 DIAGNOSIS — Z79899 Other long term (current) drug therapy: Secondary | ICD-10-CM

## 2013-05-27 DIAGNOSIS — I10 Essential (primary) hypertension: Secondary | ICD-10-CM

## 2013-05-27 LAB — BASIC METABOLIC PANEL
BUN: 32 mg/dL — ABNORMAL HIGH (ref 6–23)
CO2: 29 mEq/L (ref 19–32)
Calcium: 9 mg/dL (ref 8.4–10.5)
Chloride: 98 mEq/L (ref 96–112)
Creatinine, Ser: 1.9 mg/dL — ABNORMAL HIGH (ref 0.4–1.5)
GFR: 42.25 mL/min — AB (ref >60.00)
Glucose, Bld: 253 mg/dL — ABNORMAL HIGH (ref 70–99)
POTASSIUM: 4 meq/L (ref 3.5–5.1)
SODIUM: 136 meq/L (ref 135–145)

## 2013-05-29 ENCOUNTER — Telehealth: Payer: Self-pay

## 2013-05-29 NOTE — Telephone Encounter (Signed)
Dr.Medlin given lab results.Labs stable.pt verbalized undestanding.

## 2013-05-29 NOTE — Telephone Encounter (Signed)
Message copied by Lamar Laundry on Fri May 29, 2013  2:23 PM ------      Message from: Daneen Schick      Created: Thu May 28, 2013  6:43 PM       Labs stable ------

## 2013-06-01 ENCOUNTER — Telehealth: Payer: Self-pay | Admitting: Interventional Cardiology

## 2013-06-01 NOTE — Telephone Encounter (Signed)
Pt states his pacer is set at 60 but over last several days his rate is irregular and anywhere from 80s-90s up to 120s.  Feels like its racing at times and feels dizzy "on and off"  Reports weight is 194.8 today-up about a pound a day for last several days.  Denies SOB.  BP today 97/50. Scheduled him with [name] tomorrow 8am.  (pt scheduled 06/15/13 for Dr. Rayann Heman and device check)

## 2013-06-01 NOTE — Telephone Encounter (Signed)
New message  Patient is feeling that his pulse is running away. And he is dizzy as well. Please call and advise.

## 2013-06-02 ENCOUNTER — Ambulatory Visit (INDEPENDENT_AMBULATORY_CARE_PROVIDER_SITE_OTHER): Payer: Medicare Other | Admitting: Interventional Cardiology

## 2013-06-02 ENCOUNTER — Encounter: Payer: Self-pay | Admitting: Interventional Cardiology

## 2013-06-02 VITALS — BP 132/66 | HR 83 | Ht 67.0 in | Wt 193.0 lb

## 2013-06-02 DIAGNOSIS — I10 Essential (primary) hypertension: Secondary | ICD-10-CM

## 2013-06-02 DIAGNOSIS — I480 Paroxysmal atrial fibrillation: Secondary | ICD-10-CM

## 2013-06-02 DIAGNOSIS — I509 Heart failure, unspecified: Secondary | ICD-10-CM

## 2013-06-02 DIAGNOSIS — Z950 Presence of cardiac pacemaker: Secondary | ICD-10-CM

## 2013-06-02 DIAGNOSIS — Z7901 Long term (current) use of anticoagulants: Secondary | ICD-10-CM

## 2013-06-02 DIAGNOSIS — I4891 Unspecified atrial fibrillation: Secondary | ICD-10-CM

## 2013-06-02 DIAGNOSIS — I5032 Chronic diastolic (congestive) heart failure: Secondary | ICD-10-CM

## 2013-06-02 MED ORDER — AMIODARONE HCL 200 MG PO TABS: 200.0000 mg | ORAL_TABLET | Freq: Two times a day (BID) | ORAL | Status: DC

## 2013-06-02 NOTE — Progress Notes (Signed)
Patient ID: [name], male   DOB: 1918-11-12, 80 y.o.   MRN: [id number]    [address], [address], [place]  [address] Phone: [phone] Fax:  [phone]  Date:  06/02/2013   ID:  [name], DOB [birth year], MRN [id number]  PCP:  [name], MD   ASSESSMENT:  1. Atrial fibrillation with rapid ventricular response, persistent, now present for approximately 7-10 days. 2. Acute on chronic diastolic heart failure, aggravated by atrial fibrillation 3. Hypertension 4. Permanent pacemaker 5. Chronic oral anticoagulation therapy with Eliquis  PLAN:  1. Start amiodarone 200 mg by mouth twice a day for rate control and hopeful conversion 2. Continue other therapies as listed 3. Clinical followup in 10-14 days   SUBJECTIVE: [name] is a 80 y.o. male who recently had the flu. She'll after being discharged from the hospital he began noticing that his heart rate was faster than usual. He saw Dr. Irish Lack last weekend the diuretic regimen was decreased because of relatively low blood pressure. He denies dyspnea. He does admit to fatigue. He has not had syncope or dizziness. His weight is been stable.   Wt Readings from Last 3 Encounters:  06/02/13 193 lb (87.544 kg)  05/19/13 190 lb 14.4 oz (86.592 kg)  05/09/13 199 lb 4.7 oz (90.4 kg)     Past Medical History  Diagnosis Date  . CHF (congestive heart failure)   . Diabetes mellitus   . Hypertension   . Hyperlipidemia   . Coronary artery disease   . Spinal stenosis   . Dysrhythmia   . Pneumonia   . Pacemaker   . Arthritis   . Anemia     Current Outpatient Prescriptions  Medication Sig Dispense Refill  . allopurinol (ZYLOPRIM) 100 MG tablet Take 100 mg by mouth at bedtime.      [name] apixaban (ELIQUIS) 2.5 MG TABS tablet Take 1 tablet (2.5 mg total) by mouth 2 (two) times daily.  60 tablet  1  . dutasteride (AVODART) 0.5 MG capsule Take 0.5 mg by mouth every morning.       . ferrous sulfate 325 (65  FE) MG tablet Take 325 mg by mouth every other day.       . insulin glargine (LANTUS) 100 UNIT/ML injection Inject 12 Units into the skin at bedtime.       . insulin lispro (HUMALOG) 100 UNIT/ML injection Inject 9-10 Units into the skin 3 (three) times daily before meals. 9 units with breakfast and lunch & 9 units after supper      . isosorbide mononitrate (IMDUR) 30 MG 24 hr tablet Take 30 mg by mouth daily.      [name] losartan (COZAAR) 100 MG tablet Take 100 mg by mouth daily.      . metoprolol tartrate (LOPRESSOR) 25 MG tablet take 1 tablet by mouth once daily  30 tablet  1  . Multiple Vitamin (MULTIVITAMIN WITH MINERALS) TABS Take 1 tablet by mouth daily. Diabetic pak by Petra Kuba from [employer]      . Polyethyl Glycol-Propyl Glycol (SYSTANE) 0.4-0.3 % SOLN Apply 1 drop to eye daily. Into affected eyes      . polyethylene glycol (MIRALAX / GLYCOLAX) packet Take 17 g by mouth daily as needed. For constipation      . Polyvinyl Alcohol-Povidone (REFRESH OP) Place 1 drop into both eyes 2 (two) times daily. Refresh Gel Opth      . potassium chloride SA (K-DUR,KLOR-CON) 20 MEQ tablet  Take 20 mEq by mouth daily.      . silodosin (RAPAFLO) 8 MG CAPS capsule Take 8 mg by mouth daily with breakfast.      . Tafluprost (ZIOPTAN) 0.0015 % SOLN Place 1 drop into both eyes every other day. Use at night      . torsemide (DEMADEX) 20 MG tablet Takes 3 in the am 2 in the pm       No current facility-administered medications for this visit.    Allergies:    Allergies  Allergen Reactions  . Statins Other (See Comments)    rhabdomyolisis    Social History:  The patient  reports that he has never smoked. He has never used smokeless tobacco. He reports that he does not drink alcohol or use illicit drugs.   ROS:  Please see the history of present illness.   Appetite is good. Denies chills or fever. No neurological complaints. No blood in urine or stool.   All other systems reviewed and negative.   OBJECTIVE: VS:  BP  132/66  Pulse 83  Ht 5\' 7"  (1.702 m)  Wt 193 lb (87.544 kg)  BMI 30.22 kg/m2  SpO2 94% Well nourished, well developed, in no acute distress, comfortable HEENT: normal Neck: JVD flat. Carotid bruit absent  Cardiac:  normal S1, S2; RRR; no murmur Lungs:  clear to auscultation bilaterally, no wheezing, rhonchi or rales Abd: soft, nontender, no hepatomegaly Ext: Edema absent. Pulses 2+ Skin: warm and dry Neuro:  CNs 2-12 intact, no focal abnormalities noted  EKG:  Atrial fibrillation with rapid ventricular response at rate of 100 beats per minute       Signed, Illene Labrador III, MD 06/02/2013 8:47 AM

## 2013-06-02 NOTE — Patient Instructions (Addendum)
Your physician has recommended you make the following change in your medication:   1)Start Amiodarone 200mg  twice daily. An Rx has been sent to yor pharmacy  2)Take all other medications as prescribed  You have an appt scheduled for 06/11/13 @ 9:30am  Call the office if you heart rate goes above 110 bpm

## 2013-06-04 NOTE — Telephone Encounter (Signed)
Spoke with [name] at [employer] office and also with pt. Pt saw [name] on 1/20 here in office.  [name] reports she checked pt's blood pressure yesterday because pt said he was feeling funny. Readings were 96/63-94                                                                                                          91/60-80                                                                                                           79/59-60. Today at 9:00 readings were 93/71-98. Rechecked again and was 118/70.  Did have episode where heart rate went up to 135.  I spoke with pt who reports he is feeling fine today. He states there are times when his heart rate goes up briefly. Highest was 135. He reports it is difficult to get accurate blood pressure readings with changes in heart rate.  Is wondering if needs to hold any medications due to low blood pressure.

## 2013-06-04 NOTE — Telephone Encounter (Signed)
I asked the patient to stop Imdur. Please remove from med list.

## 2013-06-04 NOTE — Telephone Encounter (Signed)
New Prob    States pt was recently put on a new medication and his BP has gotten very low. Would like to speak to nurse. Please call.

## 2013-06-05 ENCOUNTER — Telehealth: Payer: Self-pay | Admitting: Interventional Cardiology

## 2013-06-05 NOTE — Telephone Encounter (Signed)
New message  Patient is taking a new BP med ( pacerone) he is unable to get a BP today. All other vitals are good, please call and advise.

## 2013-06-08 ENCOUNTER — Telehealth: Payer: Self-pay

## 2013-06-08 NOTE — Telephone Encounter (Signed)
pt called in.pt sts that hisheart rate has been in the 120 bpm.pt c/p is dizzy spells that last 5 min.pt bp last night was 118/70.pt denies cp or sob.pt adv that Dr.Smith is not in the office today, and is at the hospital.I will fwd a message to Tatum for his recommendation and call him back.pt sts that he called the on call srvice and no one called hi back.adv pt that I would fwd that info to management.

## 2013-06-08 NOTE — Telephone Encounter (Signed)
called pt.pt given Dr.Smith instructions to increase metoprolol back to 25 bid.continu e to monitor bp and heartrate at home.pt verbalized undersatnding.pt will call back with and update of how he is doing during the week.

## 2013-06-11 ENCOUNTER — Encounter: Payer: Self-pay | Admitting: Interventional Cardiology

## 2013-06-11 ENCOUNTER — Ambulatory Visit (INDEPENDENT_AMBULATORY_CARE_PROVIDER_SITE_OTHER): Payer: Medicare Other | Admitting: Interventional Cardiology

## 2013-06-11 VITALS — BP 108/70 | HR 98 | Ht 67.0 in | Wt 195.4 lb

## 2013-06-11 DIAGNOSIS — I5032 Chronic diastolic (congestive) heart failure: Secondary | ICD-10-CM

## 2013-06-11 DIAGNOSIS — Z950 Presence of cardiac pacemaker: Secondary | ICD-10-CM

## 2013-06-11 DIAGNOSIS — I480 Paroxysmal atrial fibrillation: Secondary | ICD-10-CM

## 2013-06-11 DIAGNOSIS — I10 Essential (primary) hypertension: Secondary | ICD-10-CM

## 2013-06-11 DIAGNOSIS — I509 Heart failure, unspecified: Secondary | ICD-10-CM

## 2013-06-11 DIAGNOSIS — Z7901 Long term (current) use of anticoagulants: Secondary | ICD-10-CM

## 2013-06-11 DIAGNOSIS — I4891 Unspecified atrial fibrillation: Secondary | ICD-10-CM

## 2013-06-11 MED ORDER — TORSEMIDE 20 MG PO TABS: 60.0000 mg | ORAL_TABLET | Freq: Two times a day (BID) | ORAL | Status: DC

## 2013-06-11 MED ORDER — LOSARTAN POTASSIUM 100 MG PO TABS: 50.0000 mg | ORAL_TABLET | Freq: Every day | ORAL | Status: DC

## 2013-06-11 NOTE — Patient Instructions (Addendum)
Your physician has recommended you make the following change in your medication:  1) Decrease Losartan to 50mg  daily 2) Increase Demadex to 60mg  twice daily (60mg  in the am and 60mg  in the pm)  If systolic Blood Pressure is less than 100 hold Metoprolol for that dose  Please take Amiodarone 200mg  twice daily  Your physician recommends that you return for lab work in: 1 week (Bmet)  You have a follow up appt scheduled for 06/18/13 @8 :45 am

## 2013-06-11 NOTE — Progress Notes (Signed)
Patient ID: [name], male   DOB: 05/29/1918, 80 y.o.   MRN: [id number]    [address] [place], [place]  [address] Phone: [phone] Fax:  [phone]  Date:  06/11/2013   ID:  RICH PAPROCKI, DOB 27-Sep-1918, MRN [id number]  PCP:  [name], MD   ASSESSMENT:  1. Atrial fibrillation with better controlled ventricular response 2. Relatively low blood pressure 3. Weight gain and in setting of diastolic heart failure, mild decompensation is suspected 4. Anticoagulation therapy without complications  PLAN:  1. Increase amiodarone to 200 mg twice a day 2. Decrease losartan to 50 mg per day. 3. Increase Demadex to 60 mg twice a day 4. Continue to stay off isosorbide. 5. One week followup, basic metabolic panel will be done at that time 6. Continue metoprolol 25 mg twice a day assuming systolic blood pressure greater than 100 mm mercury   SUBJECTIVE: RALPHAEL SOUTHGATE is a 80 y.o. male who unfortunately has developed persistent atrial fibrillation. This is cause relatively low blood pressures. His weight is increasing. He denies shortness of breath. His been confusion about the medication regimen. Because his rate initially was quite fast his blood pressure recordings were sporadically undetectable. This is prompted multiple phone calls. I have not always been present on the office. Multiple different people have spoken to the patient and this causes confusion. 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 Not sure who this was.  Overall, he seems to be doing relatively well today. His weight is up 2 pounds compared to the last office visit per and 5 pounds compared to 3 weeks ago.   Wt Readings from Last 3 Encounters:  06/11/13 195 lb 6.4 oz (88.633 kg)  06/02/13 193 lb (87.544 kg)  05/19/13 190 lb 14.4 oz (86.592 kg)     Past Medical History    Diagnosis Date  . CHF (congestive heart failure)   . Diabetes mellitus   . Hypertension   . Hyperlipidemia   . Coronary artery disease   . Spinal stenosis   . Dysrhythmia   . Pneumonia   . Pacemaker   . Arthritis   . Anemia     Current Outpatient Prescriptions  Medication Sig Dispense Refill  . allopurinol (ZYLOPRIM) 100 MG tablet Take 100 mg by mouth at bedtime.      [name] amiodarone (PACERONE) 200 MG tablet Take 1 tablet (200 mg total) by mouth 2 (two) times daily.  60 tablet  11  . apixaban (ELIQUIS) 2.5 MG TABS tablet Take 1 tablet (2.5 mg total) by mouth 2 (two) times daily.  60 tablet  1  . dutasteride (AVODART) 0.5 MG capsule Take 0.5 mg by mouth every morning.       . ferrous sulfate 325 (65 FE) MG tablet Take 325 mg by mouth every other day.       . insulin glargine (LANTUS) 100 UNIT/ML injection Inject 12 Units into the skin at bedtime.       . insulin lispro (HUMALOG) 100 UNIT/ML injection Inject 9-10 Units into the skin 3 (three) times daily before meals. 9 units with breakfast and lunch & 9 units after supper      . losartan (COZAAR) 100 MG tablet Take 100 mg by mouth daily.      . metoprolol tartrate (LOPRESSOR) 25 MG tablet take  1 tablet by mouth once daily  30 tablet  1  . Multiple Vitamin (MULTIVITAMIN WITH MINERALS) TABS Take 1 tablet by mouth daily. Diabetic pak by Petra Kuba from [employer]      . Polyethyl Glycol-Propyl Glycol (SYSTANE) 0.4-0.3 % SOLN Apply 1 drop to eye daily. Into affected eyes      . polyethylene glycol (MIRALAX / GLYCOLAX) packet Take 17 g by mouth daily as needed. For constipation      . Polyvinyl Alcohol-Povidone (REFRESH OP) Place 1 drop into both eyes 2 (two) times daily. Refresh Gel Opth      . potassium chloride SA (K-DUR,KLOR-CON) 20 MEQ tablet Take 20 mEq by mouth daily.      . silodosin (RAPAFLO) 8 MG CAPS capsule Take 8 mg by mouth daily with breakfast.      . Tafluprost (ZIOPTAN) 0.0015 % SOLN Place 1 drop into both eyes every other day. Use  at night      . torsemide (DEMADEX) 20 MG tablet Takes 3 in the am 2 in the pm       No current facility-administered medications for this visit.    Allergies:    Allergies  Allergen Reactions  . Statins Other (See Comments)    rhabdomyolisis    Social History:  The patient  reports that he has never smoked. He has never used smokeless tobacco. He reports that he does not drink alcohol or use illicit drugs.   ROS:  Please see the history of present illness.    All other systems reviewed and negative.   OBJECTIVE: VS:  BP 108/70  Pulse 98  Ht 5\' 7"  (1.702 m)  Wt 195 lb 6.4 oz (88.633 kg)  BMI 30.60 kg/m2 Well nourished, well developed, in no acute distress, appearing younger than stated age 46: normal Neck: JVD moderate elevation. Carotid bruit absent  Cardiac:  normal S1, S2; IIRR; no murmur Lungs:  clear to auscultation bilaterally, no wheezing, rhonchi or rales Abd: soft, nontender, no hepatomegaly Ext: Edema left greater than right 1-2+ pitting. Pulses 2+ and symmetric Skin: warm and dry Neuro:  CNs 2-12 intact, no focal abnormalities noted  EKG:  Not repeated but heart rate is less than 100 beats per minute but greater than 85.       Signed, Illene Labrador III, MD 06/11/2013 9:48 AM

## 2013-06-13 ENCOUNTER — Encounter (HOSPITAL_COMMUNITY): Payer: Self-pay | Admitting: Emergency Medicine

## 2013-06-13 ENCOUNTER — Inpatient Hospital Stay (HOSPITAL_COMMUNITY)
Admission: EM | Admit: 2013-06-13 | Discharge: 2013-06-17 | DRG: 291 | Disposition: A | Discharge status: Home / Self Care | Payer: Medicare Other | Attending: Internal Medicine | Admitting: Internal Medicine

## 2013-06-13 ENCOUNTER — Emergency Department (HOSPITAL_COMMUNITY): Payer: Medicare Other

## 2013-06-13 DIAGNOSIS — E1169 Type 2 diabetes mellitus with other specified complication: Secondary | ICD-10-CM | POA: Diagnosis present

## 2013-06-13 DIAGNOSIS — R339 Retention of urine, unspecified: Secondary | ICD-10-CM | POA: Diagnosis not present

## 2013-06-13 DIAGNOSIS — Z950 Presence of cardiac pacemaker: Secondary | ICD-10-CM

## 2013-06-13 DIAGNOSIS — I2510 Atherosclerotic heart disease of native coronary artery without angina pectoris: Secondary | ICD-10-CM | POA: Diagnosis present

## 2013-06-13 DIAGNOSIS — Z7901 Long term (current) use of anticoagulants: Secondary | ICD-10-CM

## 2013-06-13 DIAGNOSIS — E785 Hyperlipidemia, unspecified: Secondary | ICD-10-CM | POA: Diagnosis present

## 2013-06-13 DIAGNOSIS — I10 Essential (primary) hypertension: Secondary | ICD-10-CM

## 2013-06-13 DIAGNOSIS — D649 Anemia, unspecified: Secondary | ICD-10-CM | POA: Diagnosis present

## 2013-06-13 DIAGNOSIS — E1142 Type 2 diabetes mellitus with diabetic polyneuropathy: Secondary | ICD-10-CM | POA: Diagnosis present

## 2013-06-13 DIAGNOSIS — N400 Enlarged prostate without lower urinary tract symptoms: Secondary | ICD-10-CM | POA: Diagnosis present

## 2013-06-13 DIAGNOSIS — I119 Hypertensive heart disease without heart failure: Secondary | ICD-10-CM | POA: Diagnosis present

## 2013-06-13 DIAGNOSIS — N183 Chronic kidney disease, stage 3 unspecified: Secondary | ICD-10-CM

## 2013-06-13 DIAGNOSIS — Z8673 Personal history of transient ischemic attack (TIA), and cerebral infarction without residual deficits: Secondary | ICD-10-CM

## 2013-06-13 DIAGNOSIS — J9600 Acute respiratory failure, unspecified whether with hypoxia or hypercapnia: Secondary | ICD-10-CM

## 2013-06-13 DIAGNOSIS — M129 Arthropathy, unspecified: Secondary | ICD-10-CM | POA: Diagnosis present

## 2013-06-13 DIAGNOSIS — I509 Heart failure, unspecified: Secondary | ICD-10-CM

## 2013-06-13 DIAGNOSIS — J189 Pneumonia, unspecified organism: Secondary | ICD-10-CM

## 2013-06-13 DIAGNOSIS — N289 Disorder of kidney and ureter, unspecified: Secondary | ICD-10-CM

## 2013-06-13 DIAGNOSIS — N179 Acute kidney failure, unspecified: Secondary | ICD-10-CM | POA: Diagnosis present

## 2013-06-13 DIAGNOSIS — I059 Rheumatic mitral valve disease, unspecified: Secondary | ICD-10-CM | POA: Diagnosis present

## 2013-06-13 DIAGNOSIS — I480 Paroxysmal atrial fibrillation: Secondary | ICD-10-CM

## 2013-06-13 DIAGNOSIS — I5032 Chronic diastolic (congestive) heart failure: Secondary | ICD-10-CM

## 2013-06-13 DIAGNOSIS — E119 Type 2 diabetes mellitus without complications: Secondary | ICD-10-CM

## 2013-06-13 DIAGNOSIS — I5033 Acute on chronic diastolic (congestive) heart failure: Principal | ICD-10-CM

## 2013-06-13 DIAGNOSIS — J101 Influenza due to other identified influenza virus with other respiratory manifestations: Secondary | ICD-10-CM

## 2013-06-13 DIAGNOSIS — Z96659 Presence of unspecified artificial knee joint: Secondary | ICD-10-CM

## 2013-06-13 DIAGNOSIS — I129 Hypertensive chronic kidney disease with stage 1 through stage 4 chronic kidney disease, or unspecified chronic kidney disease: Secondary | ICD-10-CM | POA: Diagnosis present

## 2013-06-13 DIAGNOSIS — I4891 Unspecified atrial fibrillation: Secondary | ICD-10-CM

## 2013-06-13 DIAGNOSIS — I739 Peripheral vascular disease, unspecified: Secondary | ICD-10-CM | POA: Diagnosis present

## 2013-06-13 NOTE — ED Provider Notes (Signed)
CSN: [id number]     Arrival date & time 06/13/13  2308 History   First MD Initiated Contact with Patient 06/13/13 2337     Chief Complaint  Patient presents with  . Shortness of Breath   (Consider location/radiation/quality/duration/timing/severity/associated sxs/prior Treatment) Patient is a 80 y.o. male presenting with shortness of breath. The history is provided by the patient.  Shortness of Breath He has a history of congestive heart failure and atrial fibrillation. He was doing well at night until about 10 PM when he noted severe dyspnea. EMS was called and noted initial oxygen saturation of 70% and he was placed on CPAP. EMS noted rales wheezes and gave him albuterol. He denies any chest pain, heaviness, tightness, pressure. He denies any nausea or cough. He is not aware of any increase in leg swelling.  Past Medical History  Diagnosis Date  . CHF (congestive heart failure)   . Diabetes mellitus   . Hyperlipidemia   . Coronary artery disease   . Spinal stenosis   . Pneumonia   . Pacemaker   . Arthritis   . Anemia   . Carotid artery disease   . PVD (peripheral vascular disease)   . Chronic diastolic congestive heart failure     ECHO 05/20/12 LVEF estimated by 2D at 55-60%  . Hyponatremia     Chronic  . CVA (cerebral infarction)   . CKD (chronic kidney disease) stage 3, GFR 30-59 ml/min   . Long term (current) use of anticoagulants     No bleeding on Eliquis  . Dysrhythmia   . PSVT (paroxysmal supraventricular tachycardia)   . PAF (paroxysmal atrial fibrillation)     Asymptomatic. On Eliquis.  . AV block, 3rd degree     With DDD St[employer] Jude PM, initially placed in 1993 by Dr. Nils Pyle. Device upgrade 10/2002 to DDD, by Dr. Rollene Fare complicated by bleeding.  . Essential hypertension, benign    Past Surgical History  Procedure Laterality Date  . Lumbar laminectomy  1995  . Back surgery    . Total knee arthroplasty Left   . Quadriceps tendon repair    . Cataract extraction     . Carotid endarterectomy    . Yag laser application Right 11/26/9676    Procedure: YAG LASER CAPSULOTOMY OF RIGHT EYE;  Surgeon: [name], MD;  Location: [facility];  Service: Ophthalmology;  Laterality: Right;  . Tonsillectomy    . Insert / replace / remove pacemaker      DDD St. Jude PM, initially placed in 1993 by Dr. Nils Pyle. Device upgrade 10/2002 to DDD, by Dr. Rollene Fare complicated by bleeding.  . Carotid endarterectomy Right 2006  . Transurethral resection of prostate     Family History  Problem Relation Age of Onset  . Multiple myeloma Father   . Hypertension Mother    History  Substance Use Topics  . Smoking status: Never Smoker   . Smokeless tobacco: Never Used  . Alcohol Use: No     Comment: Cocktails occasionally    Review of Systems  Respiratory: Positive for shortness of breath.   All other systems reviewed and are negative.    Allergies  Aggrenox; Carvedilol; Claritin; Mucinex; Plavix; Rapaflo; Statins; and Travatan z  Home Medications   Current Outpatient Rx  Name  Route  Sig  Dispense  Refill  . allopurinol (ZYLOPRIM) 100 MG tablet   Oral   Take 100 mg by mouth at bedtime.         [name]  amiodarone (PACERONE) 200 MG tablet   Oral   Take 1 tablet (200 mg total) by mouth 2 (two) times daily.   60 tablet   11   . apixaban (ELIQUIS) 2.5 MG TABS tablet   Oral   Take 1 tablet (2.5 mg total) by mouth 2 (two) times daily.   60 tablet   1   . dutasteride (AVODART) 0.5 MG capsule   Oral   Take 0.5 mg by mouth every morning.          . ferrous sulfate 325 (65 FE) MG tablet   Oral   Take 325 mg by mouth every other day.          . insulin glargine (LANTUS) 100 UNIT/ML injection   Subcutaneous   Inject 12 Units into the skin at bedtime.          . insulin lispro (HUMALOG) 100 UNIT/ML injection   Subcutaneous   Inject 9-10 Units into the skin 3 (three) times daily before meals. 9 units with breakfast and lunch & 9 units after supper          . losartan (COZAAR) 100 MG tablet   Oral   Take 0.5 tablets (50 mg total) by mouth daily.         . metoprolol tartrate (LOPRESSOR) 25 MG tablet      take 1 tablet by mouth once daily   30 tablet   1   . Multiple Vitamin (MULTIVITAMIN WITH MINERALS) TABS   Oral   Take 1 tablet by mouth daily. Diabetic pak by Petra Kuba from [employer]         . Polyethyl Glycol-Propyl Glycol (SYSTANE) 0.4-0.3 % SOLN   Ophthalmic   Apply 1 drop to eye daily. Into affected eyes         . polyethylene glycol (MIRALAX / GLYCOLAX) packet   Oral   Take 17 g by mouth daily as needed. For constipation         . Polyvinyl Alcohol-Povidone (REFRESH OP)   Both Eyes   Place 1 drop into both eyes 2 (two) times daily. Refresh Gel Opth         . potassium chloride SA (K-DUR,KLOR-CON) 20 MEQ tablet   Oral   Take 20 mEq by mouth daily.         . silodosin (RAPAFLO) 8 MG CAPS capsule   Oral   Take 8 mg by mouth daily with breakfast.         . Tafluprost (ZIOPTAN) 0.0015 % SOLN   Both Eyes   Place 1 drop into both eyes every other day. Use at night         . torsemide (DEMADEX) 20 MG tablet   Oral   Take 3 tablets (60 mg total) by mouth 2 (two) times daily. Takes 3 in the am 3 in the pm          There were no vitals taken for this visit. Physical Exam  Nursing note and vitals reviewed.  80 year old male, resting comfortably and in no acute distress. He is on BiPAP. Vital signs are 35. Oxygen saturation is 100%, which is normal. Head is normocephalic and atraumatic. PERRLA, EOMI. Oropharynx is clear. Neck is nontender and supple without adenopathy or JVD. Back is nontender and there is no CVA tenderness. Lungs have bibasilar rales. Chest is nontender. Heart has regular rate and rhythm without murmur. Abdomen is soft, flat, nontender without masses or hepatosplenomegaly and peristalsis is  normoactive. Extremities have 2+ edema. Skin is warm and dry without rash. Neurologic:  Mental status is normal, cranial nerves are intact, there is a left foot drop, but no other motor or sensory deficits.  ED Course  Procedures (including critical care time) Labs Review Results for orders placed during the hospital encounter of 06/13/13  CBC WITH DIFFERENTIAL      Result Value Range   WBC 7.1  4.0 - 10.5 K/uL   RBC 3.66 (*) 4.22 - 5.81 MIL/uL   Hemoglobin 12.0 (*) 13.0 - 17.0 g/dL   HCT 35.6 (*) 39.0 - 52.0 %   MCV 97.3  78.0 - 100.0 fL   MCH 32.8  26.0 - 34.0 pg   MCHC 33.7  30.0 - 36.0 g/dL   RDW 15.3  11.5 - 15.5 %   Platelets 211  150 - 400 K/uL   Neutrophils Relative % 72  43 - 77 %   Neutro Abs 5.1  1.7 - 7.7 K/uL   Lymphocytes Relative 19  12 - 46 %   Lymphs Abs 1.3  0.7 - 4.0 K/uL   Monocytes Relative 7  3 - 12 %   Monocytes Absolute 0.5  0.1 - 1.0 K/uL   Eosinophils Relative 2  0 - 5 %   Eosinophils Absolute 0.1  0.0 - 0.7 K/uL   Basophils Relative 0  0 - 1 %   Basophils Absolute 0.0  0.0 - 0.1 K/uL  BASIC METABOLIC PANEL      Result Value Range   Sodium 140  137 - 147 mEq/L   Potassium 4.4  3.7 - 5.3 mEq/L   Chloride 95 (*) 96 - 112 mEq/L   CO2 31  19 - 32 mEq/L   Glucose, Bld 113 (*) 70 - 99 mg/dL   BUN 33 (*) 6 - 23 mg/dL   Creatinine, Ser 2.08 (*) 0.50 - 1.35 mg/dL   Calcium 9.3  8.4 - 10.5 mg/dL   GFR calc non Af Amer 26 (*) >90 mL/min   GFR calc Af Amer 30 (*) >90 mL/min  PRO B NATRIURETIC PEPTIDE      Result Value Range   Pro B Natriuretic peptide (BNP) 4453.0 (*) 0 - 450 pg/mL  TROPONIN I      Result Value Range   Troponin I <0.30  <0.30 ng/mL   Imaging Review Dg Chest Port 1 View  06/14/2013   CLINICAL DATA:  dyspnea  EXAM: PORTABLE CHEST - 1 VIEW  COMPARISON:  DG CHEST 1V PORT dated 05/06/2013  FINDINGS: Exam is lordotic. Cardiac silhouette is mildly enlarged. There is central venous pulmonary congestion. No overt pulmonary edema. No pneumothorax.  IMPRESSION: Cardiomegaly and central venous congestion.   Electronically Signed   By:  Suzy Bouchard M.D.   On: 06/14/2013 00:48    EKG Interpretation    Date/Time:  Saturday June 13 2013 23:59:45 EST Ventricular Rate:  95 PR Interval:    QRS Duration: 95 QT Interval:  383 QTC Calculation: 481 R Axis:   75 Text Interpretation:  Atrial fibrillation Paired ventricular premature complexes Borderline T abnormalities, diffuse leads Borderline prolonged QT interval When compared with ECG of 05/05/2013, Premature ventricular complexes are now Present Confirmed by [facility]  MD, Kathryn Linarez ([id number]) on 06/14/2013 12:10:50 AM           CRITICAL CARE Performed by: RUEAV,[email or web address] Total critical care time: 35 minutes Critical care time was exclusive of separately billable procedures and treating other patients. Critical care was necessary  to treat or prevent imminent or life-threatening deterioration. Critical care was time spent personally by me on the following activities: development of treatment plan with patient and/or surrogate as well as nursing, discussions with consultants, evaluation of patient's response to treatment, examination of patient, obtaining history from patient or surrogate, ordering and performing treatments and interventions, ordering and review of laboratory studies, ordering and review of radiographic studies, pulse oximetry and re-evaluation of patient's condition.  MDM   1. CHF exacerbation   2. Renal insufficiency   3. Atrial fibrillation    Acute dyspnea which seems most likely to be exacerbation of CHF. Chest x-ray and BNP will be obtained. He seems comfortable on BiPAP maintaining oxygen saturations of 100%, so he will be taken off of BiPAP to see how he tolerates it.  Off BiPAP, oxygen saturation is covering at 94-95% which is adequate. Workup is consistent with CHF exacerbation with elevated BNP and evidence of pulmonary venous congestion on chest x-ray. He was given a dose of furosemide and case is discussed with Dr. Marvis Repress of [facility] who  agrees to admit the patient.  Delora Fuel, MD 92/90/90 [id number]

## 2013-06-13 NOTE — ED Notes (Signed)
Per EMS, pt sudden onset SOB. Upon fire arrival pt. O2 78%, pt placed on bipap by EMS. 5mg  albuterol. EMS reports wheezing and rales.

## 2013-06-13 NOTE — ED Notes (Signed)
Pt. Taken off bipap per Dr. Roxanne Mins.

## 2013-06-14 ENCOUNTER — Encounter (HOSPITAL_COMMUNITY): Payer: Self-pay | Admitting: Internal Medicine

## 2013-06-14 DIAGNOSIS — I4891 Unspecified atrial fibrillation: Secondary | ICD-10-CM

## 2013-06-14 DIAGNOSIS — J9600 Acute respiratory failure, unspecified whether with hypoxia or hypercapnia: Secondary | ICD-10-CM

## 2013-06-14 DIAGNOSIS — N183 Chronic kidney disease, stage 3 unspecified: Secondary | ICD-10-CM

## 2013-06-14 DIAGNOSIS — I5033 Acute on chronic diastolic (congestive) heart failure: Principal | ICD-10-CM

## 2013-06-14 DIAGNOSIS — I509 Heart failure, unspecified: Secondary | ICD-10-CM

## 2013-06-14 LAB — GLUCOSE, CAPILLARY
GLUCOSE-CAPILLARY: 191 mg/dL — AB (ref 70–99)
Glucose-Capillary: 137 mg/dL — ABNORMAL HIGH (ref 70–99)
Glucose-Capillary: 171 mg/dL — ABNORMAL HIGH (ref 70–99)
Glucose-Capillary: 143 mg/dL — ABNORMAL HIGH (ref 70–99)

## 2013-06-14 LAB — CBC WITH DIFFERENTIAL/PLATELET
BASOS ABS: 0 10*3/uL (ref 0.0–0.1)
Basophils Relative: 0 % (ref 0–1)
Eosinophils Absolute: 0.1 10*3/uL (ref 0.0–0.7)
Eosinophils Relative: 2 % (ref 0–5)
HEMATOCRIT: 35.6 % — AB (ref 39.0–52.0)
HEMOGLOBIN: 12 g/dL — AB (ref 13.0–17.0)
LYMPHS ABS: 1.3 10*3/uL (ref 0.7–4.0)
LYMPHS PCT: 19 % (ref 12–46)
MCH: 32.8 pg (ref 26.0–34.0)
MCHC: 33.7 g/dL (ref 30.0–36.0)
MCV: 97.3 fL (ref 78.0–100.0)
MONO ABS: 0.5 10*3/uL (ref 0.1–1.0)
Monocytes Relative: 7 % (ref 3–12)
NEUTROS ABS: 5.1 10*3/uL (ref 1.7–7.7)
Neutrophils Relative %: 72 % (ref 43–77)
Platelets: 211 10*3/uL (ref 150–400)
RBC: 3.66 MIL/uL — AB (ref 4.22–5.81)
RDW: 15.3 % (ref 11.5–15.5)
WBC: 7.1 10*3/uL (ref 4.0–10.5)

## 2013-06-14 LAB — BASIC METABOLIC PANEL
BUN: 33 mg/dL — ABNORMAL HIGH (ref 6–23)
CHLORIDE: 95 meq/L — AB (ref 96–112)
CO2: 31 meq/L (ref 19–32)
Calcium: 9.3 mg/dL (ref 8.4–10.5)
Creatinine, Ser: 2.08 mg/dL — ABNORMAL HIGH (ref 0.50–1.35)
GFR calc Af Amer: 30 mL/min — ABNORMAL LOW (ref >90)
GFR calc non Af Amer: 26 mL/min — ABNORMAL LOW (ref >90)
Glucose, Bld: 113 mg/dL — ABNORMAL HIGH (ref 70–99)
Potassium: 4.4 mEq/L (ref 3.7–5.3)
Sodium: 140 mEq/L (ref 137–147)

## 2013-06-14 LAB — TROPONIN I
Troponin I: <0.3 ng/mL (ref <0.30)
Troponin I: <0.3 ng/mL (ref <0.30)

## 2013-06-14 LAB — TSH: TSH: 1.294 u[IU]/mL (ref 0.350–4.500)

## 2013-06-14 LAB — PRO B NATRIURETIC PEPTIDE: PRO B NATRI PEPTIDE: 4453 pg/mL — AB (ref 0–450)

## 2013-06-14 MED ORDER — AMIODARONE HCL 200 MG PO TABS
200.0000 mg | ORAL_TABLET | Freq: Two times a day (BID) | ORAL | Status: DC
  Administered 2013-06-14: 11:00:00 200 mg via ORAL
  Filled 2013-06-14 (×2): qty 1

## 2013-06-14 MED ORDER — FUROSEMIDE 10 MG/ML IJ SOLN
INTRAMUSCULAR | Status: AC
  Filled 2013-06-14: qty 8

## 2013-06-14 MED ORDER — ACETAMINOPHEN 325 MG PO TABS: 650.0000 mg | ORAL_TABLET | ORAL | Status: DC | PRN

## 2013-06-14 MED ORDER — LATANOPROST 0.005 % OP SOLN
1.0000 [drp] | Freq: Every day | OPHTHALMIC | Status: DC
Start: 2013-06-14 — End: 2013-06-17
  Filled 2013-06-14: qty 2.5

## 2013-06-14 MED ORDER — AMIODARONE HCL 200 MG PO TABS
200.0000 mg | ORAL_TABLET | Freq: Every day | ORAL | Status: DC
Start: 2013-06-15 — End: 2013-06-16
  Administered 2013-06-15 – 2013-06-16 (×2): 200 mg via ORAL
  Filled 2013-06-14 (×2): qty 1

## 2013-06-14 MED ORDER — SODIUM CHLORIDE 0.9 % IJ SOLN: 3.0000 mL | INTRAMUSCULAR | Status: DC | PRN

## 2013-06-14 MED ORDER — ALLOPURINOL 100 MG PO TABS
100.0000 mg | ORAL_TABLET | Freq: Every day | ORAL | Status: DC
  Administered 2013-06-14 – 2013-06-16 (×3): 100 mg via ORAL
  Filled 2013-06-14 (×5): qty 1

## 2013-06-14 MED ORDER — INSULIN ASPART 100 UNIT/ML ~~LOC~~ SOLN
0.0000 [IU] | Freq: Every day | SUBCUTANEOUS | Status: DC
  Administered 2013-06-15: 2 [IU] via SUBCUTANEOUS

## 2013-06-14 MED ORDER — SODIUM CHLORIDE 0.9 % IV SOLN
250.0000 mL | INTRAVENOUS | Status: DC | PRN
Start: 2013-06-14 — End: 2013-06-16
  Administered 2013-06-16: 10:00:00 via INTRAVENOUS

## 2013-06-14 MED ORDER — FERROUS SULFATE 325 (65 FE) MG PO TABS
325.0000 mg | ORAL_TABLET | ORAL | Status: DC
  Administered 2013-06-14: 325 mg via ORAL
  Filled 2013-06-14 (×2): qty 1

## 2013-06-14 MED ORDER — FUROSEMIDE 10 MG/ML IJ SOLN
80.0000 mg | Freq: Two times a day (BID) | INTRAMUSCULAR | Status: DC
  Administered 2013-06-14 – 2013-06-16 (×5): 80 mg via INTRAVENOUS
  Filled 2013-06-14 (×9): qty 8

## 2013-06-14 MED ORDER — METOPROLOL TARTRATE 25 MG PO TABS
25.0000 mg | ORAL_TABLET | Freq: Two times a day (BID) | ORAL | Status: DC
  Administered 2013-06-14 – 2013-06-17 (×6): 25 mg via ORAL
  Filled 2013-06-14 (×8): qty 1

## 2013-06-14 MED ORDER — SODIUM CHLORIDE 0.9 % IJ SOLN
3.0000 mL | Freq: Two times a day (BID) | INTRAMUSCULAR | Status: DC
  Administered 2013-06-14 – 2013-06-16 (×4): 3 mL via INTRAVENOUS

## 2013-06-14 MED ORDER — APIXABAN 2.5 MG PO TABS
2.5000 mg | ORAL_TABLET | Freq: Two times a day (BID) | ORAL | Status: DC
  Administered 2013-06-14 – 2013-06-17 (×7): 2.5 mg via ORAL
  Filled 2013-06-14 (×9): qty 1

## 2013-06-14 MED ORDER — ONDANSETRON HCL 4 MG/2ML IJ SOLN: 4.0000 mg | Freq: Four times a day (QID) | INTRAMUSCULAR | Status: DC | PRN

## 2013-06-14 MED ORDER — DUTASTERIDE 0.5 MG PO CAPS
0.5000 mg | ORAL_CAPSULE | Freq: Every morning | ORAL | Status: DC
Start: 2013-06-14 — End: 2013-06-17
  Administered 2013-06-14 – 2013-06-17 (×4): 0.5 mg via ORAL
  Filled 2013-06-14 (×4): qty 1

## 2013-06-14 MED ORDER — INSULIN GLARGINE 100 UNIT/ML ~~LOC~~ SOLN
12.0000 [IU] | Freq: Every day | SUBCUTANEOUS | Status: DC
  Administered 2013-06-14 – 2013-06-16 (×3): 12 [IU] via SUBCUTANEOUS
  Filled 2013-06-14 (×4): qty 0.12

## 2013-06-14 MED ORDER — INSULIN ASPART 100 UNIT/ML ~~LOC~~ SOLN
0.0000 [IU] | Freq: Three times a day (TID) | SUBCUTANEOUS | Status: DC
  Administered 2013-06-14 (×2): 3 [IU] via SUBCUTANEOUS
  Administered 2013-06-14: 2 [IU] via SUBCUTANEOUS
  Administered 2013-06-15: 3 [IU] via SUBCUTANEOUS
  Administered 2013-06-15: 5 [IU] via SUBCUTANEOUS
  Administered 2013-06-15: 2 [IU] via SUBCUTANEOUS
  Administered 2013-06-16 – 2013-06-17 (×3): 3 [IU] via SUBCUTANEOUS
  Administered 2013-06-17: 5 [IU] via SUBCUTANEOUS

## 2013-06-14 MED ORDER — METOPROLOL TARTRATE 25 MG PO TABS
25.0000 mg | ORAL_TABLET | Freq: Every day | ORAL | Status: DC
  Administered 2013-06-14: 25 mg via ORAL
  Filled 2013-06-14: qty 1

## 2013-06-14 MED ORDER — FUROSEMIDE 10 MG/ML IJ SOLN
80.0000 mg | Freq: Once | INTRAMUSCULAR | Status: AC
  Administered 2013-06-14: 80 mg via INTRAVENOUS

## 2013-06-14 MED ORDER — TAMSULOSIN HCL 0.4 MG PO CAPS
0.4000 mg | ORAL_CAPSULE | Freq: Every day | ORAL | Status: DC
  Administered 2013-06-14 – 2013-06-16 (×3): 0.4 mg via ORAL
  Filled 2013-06-14 (×4): qty 1

## 2013-06-14 NOTE — Consult Note (Addendum)
Name: [name] is a 80 y.o. male  Admit date: 06/13/2013 Referring Physician:  Alferd [name], M.D. Primary Physician:  [name], M.D. Primary Cardiologist:  [name], M.D.  Reason for Consultation:  CHF  ASSESSMENT: 1. Acute on chronic diastolic heart failure secondary to recent onset atrial fibrillation 2. Recent onset persistent atrial fibrillation aggravating control of diastolic heart failure 3. Tachy-Brady Syndrome with permanent pacemaker 4. Hypertension 5. Diabetes mellitus with poor control 6. Acute on chronic kidney disease stage III   PLAN:  1. Continue amiodarone, it is okay to decrease the dose to 200 mg daily at discharge 2. Continue metoprolol 25 mg twice a day 3. Agree with discontinuation of Losartan 4. Continue IV diuretic therapy 5. Have pacemaker interrogated and reprogrammed if necessary in a.m. 6. Will discuss with EP to see if we have options other than rate control with beta blocker and potentially digoxin (increased risk of toxicity in setting of kidney injury and age).   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 He has a known history of diastolic heart failure. As an outpatient we became aware of continuous/persistent atrial fibrillation in early January. Since that time we have been adjusting his medications to control rate, hypotension, and exacerbation of diastolic heart failure. Most recently isosorbide, amlodipine, and losartan have been discontinued or decreased. Last evening at the [facility] where he was being honored, he developed shortness of breath after giving his speech. Upon arrival of the emergency medical service, he was hypoxic and briefly required BiPAP in the emergency room. He quickly turned around and has had a proximally 700 cc diuresis and is now lying flat he feels fine. There was no chest  pain.  Over the past 3 weeks his weight has gradually increased from our outpatient goal of 189 to around 196 pounds. On the last office visit we increased Demadex to 60 mg twice a day without significant diuresis,.   PMH:   Past Medical History  Diagnosis Date  . Diabetes mellitus   . Hyperlipidemia   . Coronary artery disease   . Spinal stenosis   . Pneumonia   . Pacemaker   . Anemia   . Carotid artery disease   . PVD (peripheral vascular disease)   . Hyponatremia     Chronic  . CVA (cerebral infarction)   . CKD (chronic kidney disease) stage 3, GFR 30-59 ml/min   . Long term (current) use of anticoagulants     No bleeding on Eliquis  . Dysrhythmia   . PSVT (paroxysmal supraventricular tachycardia)   . PAF (paroxysmal atrial fibrillation)     Asymptomatic. On Eliquis.  . AV block, 3rd degree     With DDD St. Jude PM, initially placed in 1993 by Dr. Tanda [name]. Device upgrade 10/2002 to DDD, by Dr. Alanda [name] complicated by bleeding.  . Essential hypertension, benign   . CHF (congestive heart failure)   . Chronic diastolic congestive heart failure     ECHO 05/20/12 LVEF estimated by 2D at 55-60%  . Arthritis     PSH:   Past Surgical History  Procedure Laterality Date  . Lumbar laminectomy  1995  . Back surgery    . Total knee arthroplasty Left   . Quadriceps tendon repair    . Cataract extraction    . Carotid endarterectomy    . Yag laser application Right 11/07/2012  Procedure: YAG LASER CAPSULOTOMY OF RIGHT EYE;  Surgeon: [name]., MD;  Location: [place];  Service: Ophthalmology;  Laterality: Right;  . Tonsillectomy    . Insert / replace / remove pacemaker      DDD St. Jude PM, initially placed in 1993 by Dr. Nils [name]. Device upgrade 10/2002 to DDD, by Dr. Rollene [name] complicated by bleeding.  . Carotid endarterectomy Right 2006  . Transurethral resection of prostate     Allergies:  Aggrenox; Carvedilol; Claritin; Mucinex; Plavix; Rapaflo; Statins; and  Travatan z Prior to Admit Meds:   Prescriptions prior to admission  Medication Sig Dispense Refill  . allopurinol (ZYLOPRIM) 100 MG tablet Take 100 mg by mouth at bedtime.      [name] amiodarone (PACERONE) 200 MG tablet Take 1 tablet (200 mg total) by mouth 2 (two) times daily.  60 tablet  11  . apixaban (ELIQUIS) 2.5 MG TABS tablet Take 1 tablet (2.5 mg total) by mouth 2 (two) times daily.  60 tablet  1  . dutasteride (AVODART) 0.5 MG capsule Take 0.5 mg by mouth every morning.       . ferrous sulfate 325 (65 FE) MG tablet Take 325 mg by mouth every other day.       . insulin glargine (LANTUS) 100 UNIT/ML injection Inject 12 Units into the skin at bedtime.       . insulin lispro (HUMALOG) 100 UNIT/ML injection Inject 9-10 Units into the skin 3 (three) times daily before meals. 9 units with breakfast and lunch & 9 units after supper      . losartan (COZAAR) 100 MG tablet Take 0.5 tablets (50 mg total) by mouth daily.      . metoprolol tartrate (LOPRESSOR) 25 MG tablet Take 25 mg by mouth daily.      . Multiple Vitamin (MULTIVITAMIN WITH MINERALS) TABS Take 1 tablet by mouth daily. Diabetic pak by Petra Kuba from [employer]      . Polyethyl Glycol-Propyl Glycol (SYSTANE) 0.4-0.3 % SOLN Apply 1 drop to eye daily. Into affected eyes      . polyethylene glycol (MIRALAX / GLYCOLAX) packet Take 17 g by mouth daily as needed. For constipation      . Polyvinyl Alcohol-Povidone (REFRESH OP) Place 1 drop into both eyes 2 (two) times daily. Refresh Gel Opth      . potassium chloride SA (K-DUR,KLOR-CON) 20 MEQ tablet Take 20 mEq by mouth daily.      . silodosin (RAPAFLO) 8 MG CAPS capsule Take 8 mg by mouth daily with breakfast.      . Tafluprost (ZIOPTAN) 0.0015 % SOLN Place 1 drop into both eyes every other day. Use at night      . torsemide (DEMADEX) 20 MG tablet Take 60 mg by mouth 2 (two) times daily. Takes 3 in the am 3 in the pm       Fam HX:    Family History  Problem Relation Age of Onset  . Multiple  myeloma Father   . Hypertension Mother    Social HX:    History   Social History  . Marital Status: Widowed    Spouse Name: N/A    Number of Children: 7  . Years of Education: N/A   Occupational History  . MD    Social History Main Topics  . Smoking status: Never Smoker   . Smokeless tobacco: Never Used  . Alcohol Use: No     Comment: Cocktails occasionally  . Drug Use: No  .  Sexual Activity: Not Currently   Other Topics Concern  . Not on file   Social History Narrative  . No narrative on file     Review of Systems: Appetite is adequate. He denies chest pain. Lower extremity swelling has resolved since admission. He denies transient neurological symptoms. He feels sad dizziness has been present since starting amiodarone. During the same timeframe his blood pressure separated between 80 and 110 beats per minute. I feel the dizziness may be blood pressure related. This is now improved off of antihypertensive therapy. He does complain of some nausea which certainly could be from amiodarone.  Physical Exam: Blood pressure 138/73, pulse 87, temperature 97.8 F (36.6 C), temperature source Oral, resp. rate 20, height 5\' 7"  (1.702 m), weight 205 lb 4 oz (93.1 kg), SpO2 98.00%. Weight change:    Lying flat. He is not uncomfortable. Respiratory rate is 14. HEENT exam is unremarkable. Chest is clear to auscultation with the exception of basilar crackles Cardiac exam reveals an irregular rhythm and heart rate 88 beats per minute. No murmurs heard. Abdomen is distended and non-tender Extremities reveal no edema.  Labs: Lab Results  Component Value Date   WBC 7.1 06/13/2013   HGB 12.0* 06/13/2013   HCT 35.6* 06/13/2013   MCV 97.3 06/13/2013   PLT 211 06/13/2013    Recent Labs Lab 06/13/13 2340  NA 140  K 4.4  CL 95*  CO2 31  BUN 33*  CREATININE 2.08*  CALCIUM 9.3  GLUCOSE 113*   No results found for this basename: PTT   Lab Results  Component Value Date   INR 1.01  08/21/2012   INR 0.98 08/18/2012   INR 1.00 02/29/2012   Lab Results  Component Value Date   CKTOTAL 452* 05/26/2011   CKMB 3.3 05/26/2011   TROPONINI <0.30 06/14/2013     BNP    Component Value Date/Time   PROBNP 4453.0* 06/13/2013 2340      Radiology:  Dg Chest Port 1 View  06/14/2013   CLINICAL DATA:  dyspnea  EXAM: PORTABLE CHEST - 1 VIEW  COMPARISON:  DG CHEST 1V PORT dated 05/06/2013  FINDINGS: Exam is lordotic. Cardiac silhouette is mildly enlarged. There is central venous pulmonary congestion. No overt pulmonary edema. No pneumothorax.  IMPRESSION: Cardiomegaly and central venous congestion.   Electronically Signed   By: Suzy Bouchard M.D.   On: 06/14/2013 00:48   EKG:  Intermittent pacing, background atrial fibrillation, average heart rate 88 beats per minute  ECHOCARDIOGRAM: 05/2012 Conclusions: 1. There is moderate concentric left ventricle hypertrophy. 2. Left ventricular ejection fraction estimated by 2D at 55-60 percent. 3. Moderate left atrial enlargement. 4. Mild mitral annular calcification. 5. Trace mitral valve regurgitation. 6. Analysis of mitral valve inflow, pulmonary vein Doppler and tissue Doppler suggests grade Ia diastolic dysfunction with elevated left atrial pressure. 7. The aortic valve is sclerotic with reduced excursion. 8. Trivial tricuspid regurgitation [name] 06/14/2013 1:37 PM

## 2013-06-14 NOTE — Progress Notes (Signed)
Patient seen and examined. Admitted after midnight secondary to SOB. Appears to be secondary to acute on chronic exacerbation of diastolic heart failure. Please referred to Dr. Rueben Bash H&P for further details/info on admission. Patient denies CP and currently breathing better and w/o palpitations.  Plan: -cardiology following and helping adjusting medications; will follow rec's -EP to see patient in am and interrogate device and provide further recommendations -continue IV diuretics, daily weight, low sodium diet -daily BMET to follow renal function and electrolytes  [name] [phone]

## 2013-06-14 NOTE — Progress Notes (Signed)
Patient arrived to 3E09 from emergency room.  Pt. Alert and oriented and oriented to room.  VSS.  Will continue to monitor.

## 2013-06-14 NOTE — H&P (Signed)
PCP:  [name], MD   Cardiology [facility]   Chief Complaint:  Shortness of breath  HPI: [name] is a 80 y.o. male   has a past medical history of CHF (congestive heart failure); Diabetes mellitus; Hyperlipidemia; Coronary artery disease; Spinal stenosis; Pneumonia; Pacemaker; Arthritis; Anemia; Carotid artery disease; PVD (peripheral vascular disease); Chronic diastolic congestive heart failure; Hyponatremia; CVA (cerebral infarction); CKD (chronic kidney disease) stage 3, GFR 30-59 ml/min; Long term (current) use of anticoagulants; Dysrhythmia; PSVT (paroxysmal supraventricular tachycardia); PAF (paroxysmal atrial fibrillation); AV block, 3rd degree; and Essential hypertension, benign.   Presented with  Dr. Kennon Holter [place] [employer] [place].  After his speech he developed shortness of breath. EMS arrived and noted that patient was hypoxic with O2 sat in 70's. His friend states his legs have been swollen for few days. He was seen by [name] on Thursday and had increased his dose of Demadex to 60 mg twice a day. This his Demadex was decreased to 60 mg in the morning 40 at night secondary to muscle cramps. His weight have been increasing lately in the past one to 2 days he gained at least 3 pounds. Patient recently was started on amiodarone and feels that it has been contributing to generalized weakness.  On arrival to ER he had increased work of breathing. And was transiently put on BiPAP with good results. Patient was give lasix 80 mg IV and diuresed now improved. Currently patient is minimally symptomatic resting comfortably laying down flat. She did not endorse any chest pain. Shortness of breath has resolved. Chest x-ray worrisome for some pulmonary congestion. Hospitalist was called for an admission  Review of Systems:    Pertinent positives include: shortness of breath at rest. Bilateral lower extremity swelling   Constitutional:  No weight loss, night  sweats, Fevers, chills, fatigue, weight loss  HEENT:  No headaches, Difficulty swallowing,Tooth/dental problems,Sore throat,  No sneezing, itching, ear ache, nasal congestion, post nasal drip,  Cardio-vascular:  No chest pain, Orthopnea, PND, anasarca, dizziness, palpitations.no  GI:  No heartburn, indigestion, abdominal pain, nausea, vomiting, diarrhea, change in bowel habits, loss of appetite, melena, blood in stool, hematemesis Resp:  no No dyspnea on exertion, No excess mucus, no productive cough, No non-productive cough, No coughing up of blood.No change in color of mucus.No wheezing. Skin:  no rash or lesions. No jaundice GU:  no dysuria, change in color of urine, no urgency or frequency. No straining to urinate.  No flank pain.  Musculoskeletal:  No joint pain or no joint swelling. No decreased range of motion. No back pain.  Psych:  No change in mood or affect. No depression or anxiety. No memory loss.  Neuro: no localizing neurological complaints, no tingling, no weakness, no double vision, no gait abnormality, no slurred speech, no confusion  Otherwise ROS are negative except for above, 10 systems were reviewed  Past Medical History: Past Medical History  Diagnosis Date  . CHF (congestive heart failure)   . Diabetes mellitus   . Hyperlipidemia   . Coronary artery disease   . Spinal stenosis   . Pneumonia   . Pacemaker   . Arthritis   . Anemia   . Carotid artery disease   . PVD (peripheral vascular disease)   . Chronic diastolic congestive heart failure     ECHO 05/20/12 LVEF estimated by 2D at 55-60%  . Hyponatremia     Chronic  . CVA (cerebral infarction)   .  CKD (chronic kidney disease) stage 3, GFR 30-59 ml/min   . Long term (current) use of anticoagulants     No bleeding on Eliquis  . Dysrhythmia   . PSVT (paroxysmal supraventricular tachycardia)   . PAF (paroxysmal atrial fibrillation)     Asymptomatic. On Eliquis.  . AV block, 3rd degree     With DDD  St. Jude PM, initially placed in 1993 by Dr. Nils Pyle. Device upgrade 10/2002 to DDD, by Dr. Rollene Fare complicated by bleeding.  . Essential hypertension, benign    Past Surgical History  Procedure Laterality Date  . Lumbar laminectomy  1995  . Back surgery    . Total knee arthroplasty Left   . Quadriceps tendon repair    . Cataract extraction    . Carotid endarterectomy    . Yag laser application Right 4/66/5993    Procedure: YAG LASER CAPSULOTOMY OF RIGHT EYE;  Surgeon: [name], MD;  Location: [place];  Service: Ophthalmology;  Laterality: Right;  . Tonsillectomy    . Insert / replace / remove pacemaker      DDD St. Jude PM, initially placed in 1993 by Dr. Nils Pyle. Device upgrade 10/2002 to DDD, by Dr. Rollene Fare complicated by bleeding.  . Carotid endarterectomy Right 2006  . Transurethral resection of prostate       Medications: Prior to Admission medications   Medication Sig Start Date End Date Taking? Authorizing Provider  allopurinol (ZYLOPRIM) 100 MG tablet Take 100 mg by mouth at bedtime.   Yes Historical Provider, MD  amiodarone (PACERONE) 200 MG tablet Take 1 tablet (200 mg total) by mouth 2 (two) times daily. 06/02/13  Yes Belva Crome III, MD  apixaban (ELIQUIS) 2.5 MG TABS tablet Take 1 tablet (2.5 mg total) by mouth 2 (two) times daily. 08/24/12  Yes Erline Hau, MD  dutasteride (AVODART) 0.5 MG capsule Take 0.5 mg by mouth every morning.    Yes Historical Provider, MD  ferrous sulfate 325 (65 FE) MG tablet Take 325 mg by mouth every other day.    Yes Historical Provider, MD  insulin glargine (LANTUS) 100 UNIT/ML injection Inject 12 Units into the skin at bedtime.    Yes Historical Provider, MD  insulin lispro (HUMALOG) 100 UNIT/ML injection Inject 9-10 Units into the skin 3 (three) times daily before meals. 9 units with breakfast and lunch & 9 units after supper   Yes Historical Provider, MD  losartan (COZAAR) 100 MG tablet Take 0.5 tablets (50 mg  total) by mouth daily. 06/11/13  Yes Belva Crome III, MD  metoprolol tartrate (LOPRESSOR) 25 MG tablet Take 25 mg by mouth daily.   Yes Historical Provider, MD  Multiple Vitamin (MULTIVITAMIN WITH MINERALS) TABS Take 1 tablet by mouth daily. Diabetic pak by Petra Kuba from [facility]   Yes Historical Provider, MD  Polyethyl Glycol-Propyl Glycol (SYSTANE) 0.4-0.3 % SOLN Apply 1 drop to eye daily. Into affected eyes   Yes Historical Provider, MD  polyethylene glycol (MIRALAX / GLYCOLAX) packet Take 17 g by mouth daily as needed. For constipation   Yes Historical Provider, MD  Polyvinyl Alcohol-Povidone (REFRESH OP) Place 1 drop into both eyes 2 (two) times daily. Refresh Gel Opth   Yes Historical Provider, MD  potassium chloride SA (K-DUR,KLOR-CON) 20 MEQ tablet Take 20 mEq by mouth daily.   Yes Historical Provider, MD  silodosin (RAPAFLO) 8 MG CAPS capsule Take 8 mg by mouth daily with breakfast.   Yes Historical Provider, MD  Tafluprost (ZIOPTAN)  0.0015 % SOLN Place 1 drop into both eyes every other day. Use at night   Yes Historical Provider, MD  torsemide (DEMADEX) 20 MG tablet Take 60 mg by mouth 2 (two) times daily. Takes 3 in the am 3 in the pm 06/11/13  Yes [name], MD    Allergies:   Allergies  Allergen Reactions  . Aggrenox [Aspirin-Dipyridamole Er] Other (See Comments)    Dizziness  . Carvedilol Other (See Comments)    Dizziness  . Claritin [Loratadine] Other (See Comments)    Dizziness & drowsiness   . Mucinex [Guaifenesin Er] Other (See Comments)    Dizziness, drowsiness  . Plavix [Clopidogrel Bisulfate] Other (See Comments)    Dizziness  . Rapaflo [Silodosin] Other (See Comments)    Dizziness  . Statins Other (See Comments)    rhabdomyolisis  . Travatan Z [Travoprost] Other (See Comments)    Eye irritation    Social History:  Ambulatory with walker  Lives at  Home with care giver   reports that he has never smoked. He has never used smokeless tobacco. He reports  that he does not drink alcohol or use illicit drugs.   Family History: family history includes Hypertension in his mother; Multiple myeloma in his father.    Physical Exam: Patient Vitals for the past 24 hrs:  BP Temp Temp src Pulse Resp SpO2  06/14/13 0002 121/66 mmHg 98.8 F (37.1 C) Oral 93 25 95 %    1. General:  in No Acute distress 2. Psychological: Alert and  Oriented 3. Head/ENT:   Moist   Mucous Membranes                          Head Non traumatic, neck supple                          Normal  Dentition 4. SKIN: normal   Skin turgor,  Skin clean Dry and intact no rash 5. Heart: Regular rate and rhythm no Murmur, Rub or gallop 6. Lungs: Clear to auscultation bilaterally, no wheezes or crackles   7. Abdomen: Soft, non-tender, Non distended slightly obese 8. Lower extremities: no clubbing, cyanosis, 2+ pitting edema of the feet 9. Neurologically Grossly intact, moving all 4 extremities equally 10. MSK: Normal range of motion  body mass index is unknown because there is no weight on file.   Labs on Admission:   Recent Labs  06/13/13 2340  NA 140  K 4.4  CL 95*  CO2 31  GLUCOSE 113*  BUN 33*  CREATININE 2.08*  CALCIUM 9.3   No results found for this basename: AST, ALT, ALKPHOS, BILITOT, PROT, ALBUMIN,  in the last 72 hours No results found for this basename: LIPASE, AMYLASE,  in the last 72 hours  Recent Labs  06/13/13 2340  WBC 7.1  NEUTROABS 5.1  HGB 12.0*  HCT 35.6*  MCV 97.3  PLT 211    Recent Labs  06/13/13 2340  TROPONINI <0.30   No results found for this basename: TSH, T4TOTAL, FREET3, T3FREE, THYROIDAB,  in the last 72 hours No results found for this basename: VITAMINB12, FOLATE, FERRITIN, TIBC, IRON, RETICCTPCT,  in the last 72 hours Lab Results  Component Value Date   HGBA1C 8.4* 02/29/2012    The CrCl is unknown because both a height and weight (above a minimum accepted value) are required for this calculation. ABG  Component  Value Date/Time   PHART 7.378 02/01/2011 0955   HCO3 29.3* 02/29/2012 0609   TCO2 31 08/21/2012 0701   O2SAT 70.0 02/29/2012 0609     Lab Results  Component Value Date   DDIMER 0.72* 08/26/2012     Other results:  I have pearsonaly reviewed this: ECG REPORT  Rate: 95  Rhythm: a.fib intermittently paced    Cultures:    Component Value Date/Time   SDES URINE, CATHETERIZED 05/06/2013 0700   SPECREQUEST NONE 05/06/2013 0700   CULT  Value: NO GROWTH 5 DAYS Performed at [facility] 05/05/2013 1705   REPTSTATUS 05/07/2013 FINAL 05/06/2013 0700       Radiological Exams on Admission: Dg Chest Port 1 View  06/14/2013   CLINICAL DATA:  dyspnea  EXAM: PORTABLE CHEST - 1 VIEW  COMPARISON:  DG CHEST 1V PORT dated 05/06/2013  FINDINGS: Exam is lordotic. Cardiac silhouette is mildly enlarged. There is central venous pulmonary congestion. No overt pulmonary edema. No pneumothorax.  IMPRESSION: Cardiomegaly and central venous congestion.   Electronically Signed   By: Suzy Bouchard M.D.   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 CHF exacerbation - admit to telemetry, Lasix IV, obtain echo gram, cycle cardiac enzymes, cardiology consult in a.m.  . Acute respiratory failure - most likely secondary to CHF exacerbation currently improved continue oxygen  . Diabetes mellitus - continue Lantus and sliding scale  . Paroxysmal atrial fibrillation - continue anticoagulation and rate control patient is status post pacemaker, for now we'll continue with amiodarone  . Hypertension - have discontinued ARB given worsening renal function  . CKD (chronic kidney disease), stage III - Hoelscher has been steadily increasing we'll continue to monitor creatinine     Prophylaxis: Eliques, Protonix  CODE STATUS: Patient wishes to BE full  code  Other plan as per orders.  I have spent a total of 55 min on this admission  DOUTOVA,ANASTASSIA 06/14/2013, 1:57 AM

## 2013-06-15 ENCOUNTER — Encounter: Payer: Self-pay | Admitting: Internal Medicine

## 2013-06-15 ENCOUNTER — Encounter: Payer: Medicare Other | Admitting: Internal Medicine

## 2013-06-15 DIAGNOSIS — E119 Type 2 diabetes mellitus without complications: Secondary | ICD-10-CM

## 2013-06-15 DIAGNOSIS — I059 Rheumatic mitral valve disease, unspecified: Secondary | ICD-10-CM

## 2013-06-15 DIAGNOSIS — I10 Essential (primary) hypertension: Secondary | ICD-10-CM

## 2013-06-15 DIAGNOSIS — Z7901 Long term (current) use of anticoagulants: Secondary | ICD-10-CM

## 2013-06-15 LAB — GLUCOSE, CAPILLARY
GLUCOSE-CAPILLARY: 225 mg/dL — AB (ref 70–99)
GLUCOSE-CAPILLARY: 143 mg/dL — AB (ref 70–99)
Glucose-Capillary: 178 mg/dL — ABNORMAL HIGH (ref 70–99)
Glucose-Capillary: 212 mg/dL — ABNORMAL HIGH (ref 70–99)

## 2013-06-15 LAB — BASIC METABOLIC PANEL
BUN: 32 mg/dL — ABNORMAL HIGH (ref 6–23)
CHLORIDE: 96 meq/L (ref 96–112)
CO2: 31 meq/L (ref 19–32)
CREATININE: 2.05 mg/dL — AB (ref 0.50–1.35)
Calcium: 8.8 mg/dL (ref 8.4–10.5)
GFR calc Af Amer: 31 mL/min — ABNORMAL LOW (ref >90)
GFR calc non Af Amer: 26 mL/min — ABNORMAL LOW (ref >90)
Glucose, Bld: 181 mg/dL — ABNORMAL HIGH (ref 70–99)
POTASSIUM: 3.6 meq/L — AB (ref 3.7–5.3)
Sodium: 139 mEq/L (ref 137–147)

## 2013-06-15 NOTE — Consult Note (Addendum)
ELECTROPHYSIOLOGY CONSULT NOTE   Patient ID: [name] MRN: [id number], DOB/AGE: [birth year]   Admit date: 06/13/2013 Date of Consult: 06/15/2013  Primary Physician: [name], MD Primary Cardiologist: Daneen Schick, MD Reason for Consultation: Atrial fibrillation  History of Present Illness Dr. 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 At his recent office visit with [name] he was found to have persistent atrial fibrillation since 05/21/2013. On 06/02/2013, amiodarone was added. Since that time [name] has been adjusting his medications to control rate, hypotension and exacerbation of diastolic heart failure. Most recently isosorbide, amlodipine, and losartan have been discontinued or decreased. Amiodarone was increased to 200 mg twice daily on 06/11/2013. On Sat evening at the [facility] where he was being honored, he developed shortness of breath after giving his speech. Upon arrival of EMS, he was hypoxic and briefly required BiPAP in the emergency room. He has now been diuresed and his SOB resolved. EP has been asked to see for recommendations regarding AAD options for management of AF. Dr. Kennon Holter denies CP, palpitations or syncope. He reports compliance with his medications.   Device history - He originally received a pacemaker about 20 years ago for symptomatic bradycardia. He underwent generator change by Dr Rollene Fare in 2004 and then again by Dr Ilda Foil in 2011. His current device is a STJ 2210 RF pacemaker.  Past Medical History Past Medical History  Diagnosis Date  . Diabetes mellitus   . Hyperlipidemia   . Coronary artery disease   . Spinal stenosis   . Pneumonia   . Pacemaker   . Anemia   . Carotid artery disease   . PVD (peripheral vascular disease)   . Hyponatremia      Chronic  . CVA (cerebral infarction)   . CKD (chronic kidney disease) stage 3, GFR 30-59 ml/min   . Long term (current) use of anticoagulants     No bleeding on Eliquis  . Dysrhythmia   . PSVT (paroxysmal supraventricular tachycardia)   . PAF (paroxysmal atrial fibrillation)     Asymptomatic. On Eliquis.  . AV block, 3rd degree     With DDD St. Jude PM, initially placed in 1993 by Dr. Nils Pyle. Device upgrade 10/2002 to DDD, by Dr. Rollene Fare complicated by bleeding.  . Essential hypertension, benign   . CHF (congestive heart failure)   . Chronic diastolic congestive heart failure     ECHO 05/20/12 LVEF estimated by 2D at 55-60%  . Arthritis     Past Surgical History Past Surgical History  Procedure Laterality Date  . Lumbar laminectomy  1995  . Back surgery    . Total knee arthroplasty Left   . Quadriceps tendon repair    . Cataract extraction    . Carotid endarterectomy    . Yag laser application Right 3/90/3009    Procedure: YAG LASER CAPSULOTOMY OF RIGHT EYE;  Surgeon: [name], MD;  Location: [facility];  Service: Ophthalmology;  Laterality: Right;  . Tonsillectomy    . Insert / replace / remove pacemaker      DDD St. Jude PM, initially placed in 1993 by Dr. Nils Pyle. Device upgrade 10/2002 to DDD, by Dr. Rollene Fare complicated by bleeding.  . Carotid endarterectomy Right 2006  . Transurethral resection of prostate      Allergies/Intolerances Allergies  Allergen Reactions  . Aggrenox [Aspirin-Dipyridamole Er] Other (See Comments)    Dizziness  . Carvedilol Other (See Comments)    Dizziness  . Claritin [Loratadine] Other (See Comments)    Dizziness & drowsiness   . Mucinex [Guaifenesin Er] Other (See Comments)    Dizziness, drowsiness  . Plavix [Clopidogrel Bisulfate] Other (See Comments)    Dizziness  . Rapaflo [Silodosin] Other (See Comments)    Dizziness  . Statins Other (See Comments)    rhabdomyolisis  . Travatan Z [Travoprost] Other (See Comments)      Eye irritation    Inpatient Medications . allopurinol  100 mg Oral QHS  . amiodarone  200 mg Oral Daily  . apixaban  2.5 mg Oral BID  . dutasteride  0.5 mg Oral q morning - 10a  . ferrous sulfate  325 mg Oral QODAY  . furosemide  80 mg Intravenous BID  . insulin aspart  0-15 Units Subcutaneous TID WC  . insulin aspart  0-5 Units Subcutaneous QHS  . insulin glargine  12 Units Subcutaneous QHS  . latanoprost  1 drop Both Eyes QHS  . metoprolol tartrate  25 mg Oral BID  . sodium chloride  3 mL Intravenous Q12H  . tamsulosin  0.4 mg Oral Daily    Family History Family History  Problem Relation Age of Onset  . Multiple myeloma Father   . Hypertension Mother      Social History History   Social History  . Marital Status: Widowed    Spouse Name: N/A    Number of Children: 7  . Years of Education: N/A   Occupational History  . MD    Social History Main Topics  . Smoking status: Never Smoker   . Smokeless tobacco: Never Used  . Alcohol Use: No     Comment: Cocktails occasionally  . Drug Use: No  . Sexual Activity: Not Currently   Other Topics Concern  . Not on file   Social History Narrative  . No narrative on file     Review of Systems General: No chills, fever, night sweats or weight changes  Cardiovascular:  No chest pain, dyspnea on exertion, edema, orthopnea, palpitations, paroxysmal nocturnal dyspnea Dermatological: No rash, lesions or masses Respiratory: No cough, dyspnea Urologic: No hematuria, dysuria Abdominal: No nausea, vomiting, diarrhea, bright red blood per rectum, melena, or hematemesis Neurologic: No visual changes, weakness, changes in mental status All other systems reviewed and are otherwise negative except as noted above.  Physical Exam Vitals: Blood pressure 121/96, pulse 92, temperature 98 F (36.7 C), temperature source Oral, resp. rate 20, height '5\' 7"'  (1.702 m), weight 203 lb 11.3 oz (92.4 kg), SpO2 95.00%.  General: Well  developed, well appearing 80 y.o. male in no acute distress. HEENT: Normocephalic, atraumatic. EOMs intact. Sclera nonicteric. Oropharynx clear.  Neck: Supple without bruits. No JVD. Lungs: Respirations regular and unlabored, CTA bilaterally. No wheezes, rales or rhonchi. Heart: Irregular. S1, S2 present. No murmurs, rub, S3 or S4. Abdomen: Soft, non-tender, non-distended. BS present x 4 quadrants. No hepatosplenomegaly.  Extremities: No clubbing, cyanosis or edema. DP/PT/Radials 2+ and equal bilaterally. Psych: Normal affect. Neuro: Alert and oriented X 3. Moves all extremities spontaneously. Musculoskeletal: No kyphosis. Skin: Intact. Warm and dry. No rashes or petechiae in exposed areas.   Labs  Recent Labs  06/13/13 2340 06/14/13 0745 06/14/13 1050 06/14/13 1705  TROPONINI <0.30 <0.30 <0.30 <0.30   Lab Results  Component Value Date   WBC 7.1 06/13/2013  HGB 12.0* 06/13/2013   HCT 35.6* 06/13/2013   MCV 97.3 06/13/2013   PLT 211 06/13/2013    Recent Labs Lab 06/15/13 0556  NA 139  K 3.6*  CL 96  CO2 31  BUN 32*  CREATININE 2.05*  CALCIUM 8.8  GLUCOSE 181*    Recent Labs  06/14/13 0745  TSH 1.294    Radiology/Studies Dg Chest Port 1 View  06/14/2013   CLINICAL DATA:  dyspnea  EXAM: PORTABLE CHEST - 1 VIEW  COMPARISON:  DG CHEST 1V PORT dated 05/06/2013  FINDINGS: Exam is lordotic. Cardiac silhouette is mildly enlarged. There is central venous pulmonary congestion. No overt pulmonary edema. No pneumothorax.  IMPRESSION: Cardiomegaly and central venous congestion.   Electronically Signed   By: Suzy Bouchard M.D.   On: 06/14/2013 00:48    Echocardiogram - this admission pending From Jan 2013 Study Conclusions - Left ventricle: The cavity size was normal. There was severe focal basal and moderate concentric hypertrophy. Systolic function was normal. The estimated ejection fraction was in the range of 50% to 55%. Features are consistent with a pseudonormal left  ventricular filling pattern, with concomitant abnormal relaxation and increased filling pressure (grade 2 diastolic dysfunction). - Aortic valve: Severe diffuse thickening and calcification. There was mild stenosis. Valve area: 1.48cm^2(VTI). Valve area: 1.53cm^2 (Vmax). - Left atrium: The atrium was mildly dilated.  12-lead ECG on admission - atrial fibrillation at 95 bpm Telemetry reviewed - persistent atrial fibrillation, V rate controlled currently  Device interrogation performed by industry today - Normal pacemaker function. Battery longevity 3.8 - 4.5 years. AP 74%, VP 81%. 20 mode switch episodes, longest episode in progress since 05/21/2013. V rate during mode switch <110 bpm ~80% of time.  Assessment and Plan 1. Persistent atrial fibrillation - amiodarone is best option for AAD which was added 06/02/2013 but this has been down-titrated due to side effects - echo pending but will review once available - consider DCCV to restore SR    2. Acute on chronic diastolic HF - improved with diuresis 3. Tachy-brady syndrome s/p PPM implant ([employer]) - normal device function by interrogation today 4. CKD 5. HTN with severe focal basal and moderate concentric LVH by echo Jan 2013  SignedIleene Hutchinson, PA-C 06/15/2013, 11:11 AM   EP Attending  Patient seen and examined. Agree with the history, physical exam, and assessment. He has recurrent atrial fibrillation and has been on low dose amio for several weeks. He has had some nausea and dizziness with his amiodarone. He needs restoration of sinus rhythm and I would suggest DCCV and continue amiodarone. I hope he will maintain NSR. He may not be able to take both a beta blocker and amio.  Mikle Bosworth.D.

## 2013-06-15 NOTE — Care Management Note (Addendum)
  Page 1 of 1   06/17/2013     5:09:51 PM   CARE MANAGEMENT NOTE 06/17/2013  Patient:  IDAN, PRIME   Account Number:  [id number]  Date Initiated:  06/15/2013  Documentation initiated by:  Bryer Gottsch  Subjective/Objective Assessment:   Admitted with shortness of breath and Afib     Action/Plan:   CM will follow for disposition needs.   Anticipated DC Date:  06/18/2013   Anticipated DC Plan:  HOME/SELF CARE         Choice offered to / List presented to:             Status of service:  Completed, signed off Medicare Important Message given?   (If response is "NO", the following Medicare IM given date fields will be blank) Date Medicare IM given:   Date Additional Medicare IM given:    Discharge Disposition:  HOME/SELF CARE  Per UR Regulation:  Reviewed for med. 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

## 2013-06-15 NOTE — Progress Notes (Signed)
UR completed Dravon Nott K. Meklit Cotta, RN, BSN, MSHL, CCM  06/15/2013 4:21 PM

## 2013-06-15 NOTE — Progress Notes (Signed)
[facility] PROGRESS NOTE Interim History: 80 y/o male with pmh of atrial fibrillation (s/p pacemaker and on apixaban), diastolic CHF, CKD stage 3, and diabetes (on insulin); came to the hospital due to acute on chronic diastolic exacerbation due to uncontrolled A. Fib. Cardiology and EP on board. Patient with excellent response to diuresis.   Filed Weights   06/14/13 0358 06/15/13 0538  Weight: 93.1 kg (205 lb 4 oz) 92.4 kg (203 lb 11.3 oz)        Intake/Output Summary (Last 24 hours) at 06/15/13 2032 Last data filed at 06/15/13 8828  Gross per 24 hour  Intake    723 ml  Output   1427 ml  Net   -704 ml     Assessment/Plan: 1-Acute on chronic diastolic CHF exacerbation: due to uncontrolled atrial fibrillation. -continue diuresis -pacemaker to be interrogated and EP consulted -will follow rec's -continue daily weight and strict intake and output -low sodium diet  2-Chronic anticoagulation: continue apixaban  3-Paroxysmal atrial fibrillation: continue amiodarone and metoprolol for now. -cardiology + EP on board -will follow rec's -might need cardioversion (ideally complete ablation with pacemaker dependency)   4-CKD (chronic kidney disease), stage III -stable and at baseline -close monitoring with diuresis  5-Acute respiratory failure with hypoxia: due to #1  6-Diabetes mellitus: continue SSI and lantus  7-Hypertension: soft, but stable. No orthostatic changes -will monitor.  8-BPH: continue flomax and dutasteride     Code Status: Full Family Communication: no family at bedside Disposition Plan: home when medically stable   Consultants:  Cardiology + EP  Procedures: ECHO: pending  Antibiotics:  None   HPI/Subjective: Feeling better and breathing comfortable. No orthostatic changes this morning, no CP. No edema on his LE  Objective: Filed Vitals:   06/15/13 0731 06/15/13 0732 06/15/13 0946 06/15/13 1325  BP: 125/76 150/89 121/96 96/63   Pulse: 85 104 92 83  Temp:    98.6 F (37 C)  TempSrc:    Oral  Resp:    20  Height:      Weight:      SpO2:    96%     Exam:  General: Alert, awake, oriented x3, in no acute distress.  HEENT: No bruits, no goiter. No JVD Heart: irregular, no rubs, gallops or murmurs Lungs: Good air movement, mild decrease BS at bases, no frank crackles Abdomen: Soft, nontender, nondistended, positive bowel sounds.  Neuro: Grossly intact, nonfocal.   Data Reviewed: Basic Metabolic Panel:  Recent Labs Lab 06/13/13 2340 06/15/13 0556  NA 140 139  K 4.4 3.6*  CL 95* 96  CO2 31 31  GLUCOSE 113* 181*  BUN 33* 32*  CREATININE 2.08* 2.05*  CALCIUM 9.3 8.8   CBC:  Recent Labs Lab 06/13/13 2340  WBC 7.1  NEUTROABS 5.1  HGB 12.0*  HCT 35.6*  MCV 97.3  PLT 211   Cardiac Enzymes:  Recent Labs Lab 06/13/13 2340 06/14/13 0745 06/14/13 1050 06/14/13 1705  TROPONINI <0.30 <0.30 <0.30 <0.30   BNP (last 3 results)  Recent Labs  12/01/12 0410 05/06/13 0348 06/13/13 2340  PROBNP 1354.0* 5209.0* 4453.0*   CBG:  Recent Labs Lab 06/14/13 1551 06/14/13 2153 06/15/13 0616 06/15/13 1116 06/15/13 1649  GLUCAP 171* 143* 178* 225* 143*    Studies: Dg Chest Port 1 View  06/14/2013   CLINICAL DATA:  dyspnea  EXAM: PORTABLE CHEST - 1 VIEW  COMPARISON:  DG CHEST 1V PORT dated 05/06/2013  FINDINGS: Exam is lordotic. Cardiac silhouette is mildly  enlarged. There is central venous pulmonary congestion. No overt pulmonary edema. No pneumothorax.  IMPRESSION: Cardiomegaly and central venous congestion.   Electronically Signed   By: Suzy Bouchard M.D.   On: 06/14/2013 00:48    Scheduled Meds: . allopurinol  100 mg Oral QHS  . amiodarone  200 mg Oral Daily  . apixaban  2.5 mg Oral BID  . dutasteride  0.5 mg Oral q morning - 10a  . ferrous sulfate  325 mg Oral QODAY  . furosemide  80 mg Intravenous BID  . insulin aspart  0-15 Units Subcutaneous TID WC  . insulin aspart  0-5  Units Subcutaneous QHS  . insulin glargine  12 Units Subcutaneous QHS  . latanoprost  1 drop Both Eyes QHS  . metoprolol tartrate  25 mg Oral BID  . sodium chloride  3 mL Intravenous Q12H  . tamsulosin  0.4 mg Oral Daily   Continuous Infusions:   Time > 30 minutes  Naavya Postma  [facility] Pager [phone]. If 8PM-8AM, please contact night-coverage at [email or web address], password [facility] 06/15/2013, 8:32 PM  LOS: 2 days

## 2013-06-15 NOTE — Progress Notes (Signed)
       Patient Name: [name] Date of Encounter: 06/15/2013    SUBJECTIVE: Feels better. Hoping to go home soon.  TELEMETRY:  Atrial fib with intermittent pacing. Heart rate is around 85 beats per minute at rest but increase above 110 with upright position.: Filed Vitals:   06/15/13 0538 06/15/13 0730 06/15/13 0731 06/15/13 0732  BP: 110/68 130/69 125/76 150/89  Pulse: 85 86 85 104  Temp: 98 F (36.7 C)     TempSrc: Oral     Resp: 20     Height:      Weight: 203 lb 11.3 oz (92.4 kg)     SpO2: 95%       Intake/Output Summary (Last 24 hours) at 06/15/13 0757 Last data filed at 06/14/13 2234  Gross per 24 hour  Intake    483 ml  Output    801 ml  Net   -318 ml    LABS: Basic Metabolic Panel:  Recent Labs  06/13/13 2340 06/15/13 0556  NA 140 139  K 4.4 3.6*  CL 95* 96  CO2 31 31  GLUCOSE 113* 181*  BUN 33* 32*  CREATININE 2.08* 2.05*  CALCIUM 9.3 8.8   CBC:  Recent Labs  06/13/13 2340  WBC 7.1  NEUTROABS 5.1  HGB 12.0*  HCT 35.6*  MCV 97.3  PLT 211   Cardiac Enzymes:  Recent Labs  06/14/13 0745 06/14/13 1050 06/14/13 1705  TROPONINI <0.30 <0.30 <0.30   BNP: No components found with this basename: POCBNP,  Hemoglobin A1C: No results found for this basename: HGBA1C,  in the last 72 hours Fasting Lipid Panel: No results found for this basename: CHOL, HDL, LDLCALC, TRIG, CHOLHDL, LDLDIRECT,  in the last 72 hours  Radiology/Studies:  No new data  Physical Exam: Blood pressure 150/89, pulse 104, temperature 98 F (36.7 C), temperature source Oral, resp. rate 20, height 5\' 7"  (1.702 m), weight 203 lb 11.3 oz (92.4 kg), SpO2 95.00%. Weight change: -1 lb 8.7 oz (-0.7 kg)   Basal rales Mild JVD No murmur or gallop Edema resolved  ASSESSMENT:  1. Acute on chronic diastolic heart failure aggravated by persistent atrial fibrillation 2. Persistent atrial fibrillation, on amiodarone now for approximately 3 weeks without conversion. 3.  Acute on chronic kidney injury secondary to low cardiac output and diuresis stable today compared with admission  Plan:  1. Continue diuresis 2. Pacemaker interrogation  3. EP consult for advice about management of A. fib. My current tendency would be a rate control of atrial fibrillation with management of heart failure secondarily but cardioversion will also be a consideration   Signed, [name] 06/15/2013, 7:57 AM

## 2013-06-15 NOTE — Progress Notes (Signed)
  Echocardiogram 2D Echocardiogram has been performed.  [place], [place] 06/15/2013, 11:26 AM

## 2013-06-16 ENCOUNTER — Inpatient Hospital Stay (HOSPITAL_COMMUNITY): Payer: Medicare Other | Admitting: Certified Registered"

## 2013-06-16 ENCOUNTER — Encounter (HOSPITAL_COMMUNITY): Payer: Self-pay | Admitting: Certified Registered Nurse Anesthetist

## 2013-06-16 ENCOUNTER — Encounter (HOSPITAL_COMMUNITY): Payer: Medicare Other | Admitting: Certified Registered"

## 2013-06-16 ENCOUNTER — Encounter (HOSPITAL_COMMUNITY)
Admission: EM | Disposition: A | Discharge status: Home / Self Care | Payer: Self-pay | Source: Home / Self Care | Attending: Internal Medicine

## 2013-06-16 HISTORY — PX: CARDIOVERSION: SHX1299

## 2013-06-16 LAB — GLUCOSE, CAPILLARY
GLUCOSE-CAPILLARY: 156 mg/dL — AB (ref 70–99)
GLUCOSE-CAPILLARY: 152 mg/dL — AB (ref 70–99)
GLUCOSE-CAPILLARY: 195 mg/dL — AB (ref 70–99)
Glucose-Capillary: 191 mg/dL — ABNORMAL HIGH (ref 70–99)

## 2013-06-16 LAB — BASIC METABOLIC PANEL
BUN: 31 mg/dL — AB (ref 6–23)
CALCIUM: 8.6 mg/dL (ref 8.4–10.5)
CO2: 29 mEq/L (ref 19–32)
Chloride: 98 mEq/L (ref 96–112)
Creatinine, Ser: 1.99 mg/dL — ABNORMAL HIGH (ref 0.50–1.35)
GFR calc non Af Amer: 27 mL/min — ABNORMAL LOW (ref >90)
GFR, EST AFRICAN AMERICAN: 32 mL/min — AB (ref >90)
Glucose, Bld: 188 mg/dL — ABNORMAL HIGH (ref 70–99)
Potassium: 3.5 mEq/L — ABNORMAL LOW (ref 3.7–5.3)
Sodium: 141 mEq/L (ref 137–147)

## 2013-06-16 SURGERY — CARDIOVERSION
Anesthesia: General

## 2013-06-16 MED ORDER — TORSEMIDE 20 MG PO TABS
60.0000 mg | ORAL_TABLET | Freq: Two times a day (BID) | ORAL | Status: DC
  Filled 2013-06-16: qty 3

## 2013-06-16 MED ORDER — AMIODARONE HCL 200 MG PO TABS
200.0000 mg | ORAL_TABLET | Freq: Two times a day (BID) | ORAL | Status: DC
  Administered 2013-06-16 – 2013-06-17 (×2): 200 mg via ORAL
  Filled 2013-06-16 (×3): qty 1

## 2013-06-16 MED ORDER — FUROSEMIDE 10 MG/ML IJ SOLN
80.0000 mg | Freq: Once | INTRAMUSCULAR | Status: AC
  Administered 2013-06-16: 80 mg via INTRAVENOUS

## 2013-06-16 MED ORDER — PROPOFOL 10 MG/ML IV BOLUS
INTRAVENOUS | Status: DC | PRN
  Administered 2013-06-16: 50 mg via INTRAVENOUS

## 2013-06-16 MED ORDER — TORSEMIDE 20 MG PO TABS
60.0000 mg | ORAL_TABLET | Freq: Two times a day (BID) | ORAL | Status: DC
  Administered 2013-06-17: 60 mg via ORAL
  Filled 2013-06-16 (×2): qty 3

## 2013-06-16 MED ORDER — PROPOFOL 10 MG/ML IV BOLUS
INTRAVENOUS | Status: AC
  Filled 2013-06-16: qty 20

## 2013-06-16 MED FILL — Medication: Qty: 1 | Status: AC

## 2013-06-16 NOTE — Progress Notes (Signed)
[facility] PROGRESS NOTE Interim History: 80 y/o male with pmh of atrial fibrillation (s/p pacemaker and on apixaban), diastolic CHF, CKD stage 3, and diabetes (on insulin); came to the hospital due to acute on chronic diastolic exacerbation due to uncontrolled A. Fib. Cardiology and EP on board. Patient with excellent response to diuresis. S/p DCCV on 06/16/13   Filed Weights   06/14/13 0358 06/15/13 0538 06/16/13 0624  Weight: 93.1 kg (205 lb 4 oz) 92.4 kg (203 lb 11.3 oz) 91.218 kg (201 lb 1.6 oz)        Intake/Output Summary (Last 24 hours) at 06/16/13 1133 Last data filed at 06/16/13 0900  Gross per 24 hour  Intake    720 ml  Output   1202 ml  Net   -482 ml     Assessment/Plan: 1-Acute on chronic diastolic CHF exacerbation: due to uncontrolled atrial fibrillation. -no crackles, no JVD, no Le swelling and no SOB -will transition back to demadex 60mg  BID -s/p DCCV by cardiology -will follow further recommendations -continue daily weight and strict intake and output -low sodium diet -hopefully home in am  2-Chronic anticoagulation: continue apixaban  3-Paroxysmal atrial fibrillation: continue amiodarone and metoprolol for now. -s/p cardioversion -will follow rec's from cardiology  4-CKD (chronic kidney disease), stage III -stable and at baseline -close monitoring with diuresis  5-Acute respiratory failure with hypoxia: due to #1  6-Diabetes mellitus: continue SSI and lantus  7-Hypertension: soft, but stable. No orthostatic changes -will monitor.  8-BPH: continue flomax and dutasteride     Code Status: Full Family Communication: no family at bedside Disposition Plan: home when medically stable   Consultants:  Cardiology + EP  Procedures: ECHO: pending  Antibiotics:  None   HPI/Subjective: Feeling better and breathing comfortable. No CP, no SOB. No edema on his LE  Objective: Filed Vitals:   06/15/13 1325 06/15/13 2153 06/16/13 0624  06/16/13 0936  BP: 96/63 116/53 120/61 124/79  Pulse: 83 80 80 80  Temp: 98.6 F (37 C) 97.8 F (36.6 C) 97.2 F (36.2 C)   TempSrc: Oral Oral Oral   Resp: 20 18 18    Height:      Weight:   91.218 kg (201 lb 1.6 oz)   SpO2: 96% 98% 95%      Exam:  General: Alert, awake, oriented x3, in no acute distress.  HEENT: No bruits, no goiter. No JVD Heart: irregular, no rubs, gallops or murmurs. No Le edema Lungs: Good air movement, no crackles Abdomen: Soft, nontender, nondistended, positive bowel sounds.  Neuro: Grossly intact, nonfocal.   Data Reviewed: Basic Metabolic Panel:  Recent Labs Lab 06/13/13 2340 06/15/13 0556 06/16/13 0520  NA 140 139 141  K 4.4 3.6* 3.5*  CL 95* 96 98  CO2 31 31 29   GLUCOSE 113* 181* 188*  BUN 33* 32* 31*  CREATININE 2.08* 2.05* 1.99*  CALCIUM 9.3 8.8 8.6   CBC:  Recent Labs Lab 06/13/13 2340  WBC 7.1  NEUTROABS 5.1  HGB 12.0*  HCT 35.6*  MCV 97.3  PLT 211   Cardiac Enzymes:  Recent Labs Lab 06/13/13 2340 06/14/13 0745 06/14/13 1050 06/14/13 1705  TROPONINI <0.30 <0.30 <0.30 <0.30   BNP (last 3 results)  Recent Labs  12/01/12 0410 05/06/13 0348 06/13/13 2340  PROBNP 1354.0* 5209.0* 4453.0*   CBG:  Recent Labs Lab 06/15/13 1116 06/15/13 1649 06/15/13 2149 06/16/13 0648 06/16/13 1106  GLUCAP 225* 143* 212* 156* 152*    Studies: No results found.  Scheduled  Meds: . allopurinol  100 mg Oral QHS  . amiodarone  200 mg Oral BID  . apixaban  2.5 mg Oral BID  . dutasteride  0.5 mg Oral q morning - 10a  . ferrous sulfate  325 mg Oral QODAY  . furosemide  80 mg Intravenous BID  . insulin aspart  0-15 Units Subcutaneous TID WC  . insulin aspart  0-5 Units Subcutaneous QHS  . insulin glargine  12 Units Subcutaneous QHS  . latanoprost  1 drop Both Eyes QHS  . metoprolol tartrate  25 mg Oral BID  . sodium chloride  3 mL Intravenous Q12H  . tamsulosin  0.4 mg Oral Daily   Continuous Infusions:   Time > 30  minutes  Emmajo Bennette  [facility] Pager [phone]. If 8PM-8AM, please contact night-coverage at [email or web address], password [facility] 06/16/2013, 11:33 AM  LOS: 3 days

## 2013-06-16 NOTE — Anesthesia Preprocedure Evaluation (Addendum)
Anesthesia Evaluation  Patient identified by MRN, date of birth, ID band Patient awake    Reviewed: Allergy & Precautions, H&P , NPO status , Patient's Chart, lab work & pertinent test results, reviewed documented beta blocker date and time   Airway Mallampati: II TM Distance: >3 FB Neck ROM: Full    Dental  (+) Upper Dentures and Dental Advisory Given   Pulmonary pneumonia -, resolved,  breath sounds clear to auscultation        Cardiovascular hypertension, Pt. on medications + CAD, + Peripheral Vascular Disease and +CHF + dysrhythmias Atrial Fibrillation + pacemaker Rhythm:Regular Rate:Abnormal     Neuro/Psych    GI/Hepatic   Endo/Other  diabetes  Renal/GU Renal disease     Musculoskeletal   Abdominal   Peds  Hematology  (+) anemia ,   Anesthesia Other Findings   Reproductive/Obstetrics                          Anesthesia Physical Anesthesia Plan  ASA: III  Anesthesia Plan: General   Post-op Pain Management:    Induction: Intravenous  Airway Management Planned: Mask and Natural Airway  Additional Equipment:   Intra-op Plan:   Post-operative Plan:   Informed Consent: I have reviewed the patients History and Physical, chart, labs and discussed the procedure including the risks, benefits and alternatives for the proposed anesthesia with the patient or authorized representative who has indicated his/her understanding and acceptance.   Dental advisory given  Plan Discussed with: Anesthesiologist and Surgeon  Anesthesia Plan Comments:         Anesthesia Quick Evaluation

## 2013-06-16 NOTE — Transfer of Care (Signed)
Immediate Anesthesia Transfer of Care Note  Patient: [name]  Procedure(s) Performed: Procedure(s): CARDIOVERSION BEDSIDE (N/A)  Patient Location: Nursing Unit  Anesthesia Type:General  Level of Consciousness: awake, alert , oriented, patient cooperative and responds to stimulation  Airway & Oxygen Therapy: Patient Spontanous Breathing and Patient connected to nasal cannula oxygen  Post-op Assessment: Report given to PACU RN, Post -op Vital signs reviewed and stable and Patient moving all extremities X 4  Post vital signs: Reviewed and stable  Complications: No apparent anesthesia complications

## 2013-06-16 NOTE — Progress Notes (Signed)
       Patient Name: [name] Date of Encounter: 06/16/2013    SUBJECTIVE: He feels better today. He is up and sitting on the bedside commode this morning. He denies chest pain. Breathing is adequate.  TELEMETRY:  Atrial fibrillation with moderate rate control and occasional pacing Filed Vitals:   06/15/13 0946 06/15/13 1325 06/15/13 2153 06/16/13 0624  BP: 121/96 96/63 116/53 120/61  Pulse: 92 83 80 80  Temp:  98.6 F (37 C) 97.8 F (36.6 C) 97.2 F (36.2 C)  TempSrc:  Oral Oral Oral  Resp:  20 18 18   Height:      Weight:    201 lb 1.6 oz (91.218 kg)  SpO2:  96% 98% 95%    Intake/Output Summary (Last 24 hours) at 06/16/13 0834 Last data filed at 06/16/13 0646  Gross per 24 hour  Intake    960 ml  Output   1527 ml  Net   -567 ml   I./O. : -1485 cc net since admission LABS: Basic Metabolic Panel:  Recent Labs  06/15/13 0556 06/16/13 0520  NA 139 141  K 3.6* 3.5*  CL 96 98  CO2 31 29  GLUCOSE 181* 188*  BUN 32* 31*  CREATININE 2.05* 1.99*  CALCIUM 8.8 8.6   CBC:  Recent Labs  06/13/13 2340  WBC 7.1  NEUTROABS 5.1  HGB 12.0*  HCT 35.6*  MCV 97.3  PLT 211   Cardiac Enzymes:  Recent Labs  06/14/13 0745 06/14/13 1050 06/14/13 1705  TROPONINI <0.30 <0.30 <0.30   Radiology/Studies:  No new data  Physical Exam: Blood pressure 120/61, pulse 80, temperature 97.2 F (36.2 C), temperature source Oral, resp. rate 18, height 5\' 7"  (1.702 m), weight 201 lb 1.6 oz (91.218 kg), SpO2 95.00%. Weight change: -2 lb 9.7 oz (-1.182 kg)   Decreased breath sounds at the bases. No pericardial rub or murmur is heard Edema has resolved  Weight: 201 pounds this a.m., down from 205 on admission. Outpatient weight of 195 noted on 06/11/13  ASSESSMENT:  1. Acute on chronic diastolic heart failure decompensated due to the presence of persistent atrial fibrillation 2. Persistent atrial fibrillation 3. Acute on chronic kidney injury/disease, slight improvement  since admission. 4. Labile blood pressures secondary to loss of atrial kick. We have had to remove several of his antihypertensive agents.  Plan:  1. I sincerely appreciate the input from the electrophysiology service, Dr. Lovena Le. I have spoken with the patient and we will proceed with electrical cardioversion this morning. The procedure including the risks related to anesthesia, mechanical injury from the electrical shock, stroke, death, were discussed with the patient and accepted. The indication for electrical cardioversion is to get [place] back into rhythm and make the management of diastolic heart failure more easily controlled. Assuming a successful procedure, discharge can occur within 24-48 hours after medication readjustment.  The patient has been on chronic long-term anticoagulation with eloquence. Amiodarone was started 2 weeks ago. He has had complaints of dizziness and nausea requiring a reduction in the dose from 400 mg 200 mg daily over the past 48 hours.  [name] 06/16/2013, 8:34 AM

## 2013-06-16 NOTE — Interval H&P Note (Signed)
History and Physical Interval Note:  06/16/2013 9:54 AM  [name]  has presented today for surgery, with the diagnosis of a fib  The various methods of treatment have been discussed with the patient and family. After consideration of risks, benefits and other options for treatment, the patient has consented to  Procedure(s): CARDIOVERSION BEDSIDE (N/A) as a surgical intervention .  The patient's history has been reviewed, patient examined, no change in status, stable for surgery.  I have reviewed the patient's chart and labs.  Questions were answered to the patient's satisfaction.    Procedure and risks of stroke, death, MI, CHF, skin burn, mechanical injury discussed)  [name]

## 2013-06-16 NOTE — Progress Notes (Signed)
The patient is still in A. Fib this morning.  He has been NPO since midnight.  He did not have any complaints or acute changes overnight.

## 2013-06-16 NOTE — H&P (View-Only) (Signed)
       Patient Name: [name] Date of Encounter: 06/16/2013    SUBJECTIVE: He feels better today. He is up and sitting on the bedside commode this morning. He denies chest pain. Breathing is adequate.  TELEMETRY:  Atrial fibrillation with moderate rate control and occasional pacing Filed Vitals:   06/15/13 0946 06/15/13 1325 06/15/13 2153 06/16/13 0624  BP: 121/96 96/63 116/53 120/61  Pulse: 92 83 80 80  Temp:  98.6 F (37 C) 97.8 F (36.6 C) 97.2 F (36.2 C)  TempSrc:  Oral Oral Oral  Resp:  20 18 18  Height:      Weight:    201 lb 1.6 oz (91.218 kg)  SpO2:  96% 98% 95%    Intake/Output Summary (Last 24 hours) at 06/16/13 0834 Last data filed at 06/16/13 0646  Gross per 24 hour  Intake    960 ml  Output   1527 ml  Net   -567 ml   I./O. : -1485 cc net since admission LABS: Basic Metabolic Panel:  Recent Labs  06/15/13 0556 06/16/13 0520  NA 139 141  K 3.6* 3.5*  CL 96 98  CO2 31 29  GLUCOSE 181* 188*  BUN 32* 31*  CREATININE 2.05* 1.99*  CALCIUM 8.8 8.6   CBC:  Recent Labs  06/13/13 2340  WBC 7.1  NEUTROABS 5.1  HGB 12.0*  HCT 35.6*  MCV 97.3  PLT 211   Cardiac Enzymes:  Recent Labs  06/14/13 0745 06/14/13 1050 06/14/13 1705  TROPONINI <0.30 <0.30 <0.30   Radiology/Studies:  No new data  Physical Exam: Blood pressure 120/61, pulse 80, temperature 97.2 F (36.2 C), temperature source Oral, resp. rate 18, height 5' 7" (1.702 m), weight 201 lb 1.6 oz (91.218 kg), SpO2 95.00%. Weight change: -2 lb 9.7 oz (-1.182 kg)   Decreased breath sounds at the bases. No pericardial rub or murmur is heard Edema has resolved  Weight: 201 pounds this a.m., down from 205 on admission. Outpatient weight of 195 noted on 06/11/13  ASSESSMENT:  1. Acute on chronic diastolic heart failure decompensated due to the presence of persistent atrial fibrillation 2. Persistent atrial fibrillation 3. Acute on chronic kidney injury/disease, slight improvement  since admission. 4. Labile blood pressures secondary to loss of atrial kick. We have had to remove several of his antihypertensive agents.  Plan:  1. I sincerely appreciate the input from the electrophysiology service, Dr. Taylor. I have spoken with the patient and we will proceed with electrical cardioversion this morning. The procedure including the risks related to anesthesia, mechanical injury from the electrical shock, stroke, death, were discussed with the patient and accepted. The indication for electrical cardioversion is to get us back into rhythm and make the management of diastolic heart failure more easily controlled. Assuming a successful procedure, discharge can occur within 24-48 hours after medication readjustment.  The patient has been on chronic long-term anticoagulation with eloquence. Amiodarone was started 2 weeks ago. He has had complaints of dizziness and nausea requiring a reduction in the dose from 400 mg 200 mg daily over the past 48 hours.  Signed, SMITH III,HENRY W 06/16/2013, 8:34 AM 

## 2013-06-16 NOTE — CV Procedure (Signed)
Electrical Cardioversion Procedure Note KIRIL HIPPE [id number] September 04, 1918  Procedure: Electrical Cardioversion Indications:  Atrial Fibrillation  Time Out: Verified patient identification, verified procedure,medications/allergies/relevent history reviewed, required imaging and test results available.  Performed  Procedure Details  The patient was NPO after midnight. Anesthesia was administered at the beside  by Dr.Edwards with 50mg  of propofol.  Cardioversion was done with synchronized biphasic defibrillation with AP pads with 200watts.  The patient converted to normal sinus rhythm. The patient tolerated the procedure well   IMPRESSION:  Successful cardioversion of atrial fibrillation to AV sequential pacing.    [name] 06/16/2013, 10:40 AM

## 2013-06-16 NOTE — Anesthesia Postprocedure Evaluation (Signed)
  Anesthesia Post-op Note  Patient: [name]  Procedure(s) Performed: Procedure(s): CARDIOVERSION BEDSIDE (N/A)  Patient Location: PACU  Anesthesia Type:MAC  Level of Consciousness: awake  Airway and Oxygen Therapy: Patient Spontanous Breathing  Post-op Pain: mild  Post-op Assessment: Post-op Vital signs reviewed  Post-op Vital Signs: Reviewed  Complications: No apparent anesthesia complications

## 2013-06-16 NOTE — Preoperative (Signed)
Beta Blockers   Reason not to administer Beta Blockers:Not Applicable 

## 2013-06-17 ENCOUNTER — Encounter: Payer: Self-pay | Admitting: Internal Medicine

## 2013-06-17 LAB — BASIC METABOLIC PANEL
BUN: 34 mg/dL — AB (ref 6–23)
CHLORIDE: 96 meq/L (ref 96–112)
CO2: 29 mEq/L (ref 19–32)
CREATININE: 2.06 mg/dL — AB (ref 0.50–1.35)
Calcium: 9.3 mg/dL (ref 8.4–10.5)
GFR calc non Af Amer: 26 mL/min — ABNORMAL LOW (ref >90)
GFR, EST AFRICAN AMERICAN: 31 mL/min — AB (ref >90)
GLUCOSE: 150 mg/dL — AB (ref 70–99)
Potassium: 3.7 mEq/L (ref 3.7–5.3)
Sodium: 140 mEq/L (ref 137–147)

## 2013-06-17 LAB — GLUCOSE, CAPILLARY
GLUCOSE-CAPILLARY: 233 mg/dL — AB (ref 70–99)
Glucose-Capillary: 169 mg/dL — ABNORMAL HIGH (ref 70–99)

## 2013-06-17 NOTE — Discharge Instructions (Signed)
Information on my medicine - ELIQUIS (apixaban)  This medication education was reviewed with me or my healthcare representative as part of my discharge preparation.    Why was Eliquis prescribed for you? Eliquis was prescribed for you to reduce the risk of a blood clot forming that can cause a stroke if you have a medical condition called atrial fibrillation (a type of irregular heartbeat).  What do You need to know about Eliquis ? Take your Eliquis TWICE DAILY - one tablet in the morning and one tablet in the evening with or without food. If you have difficulty swallowing the tablet whole please discuss with your pharmacist how to take the medication safely.  Take Eliquis exactly as prescribed by your doctor and DO NOT stop taking Eliquis without talking to the doctor who prescribed the medication.  Stopping may increase your risk of developing a stroke.  Refill your prescription before you run out.  After discharge, you should have regular check-up appointments with your healthcare provider that is prescribing your Eliquis.  In the future your dose may need to be changed if your kidney function or weight changes by a significant amount or as you get older.  What do you do if you miss a dose? If you miss a dose, take it as soon as you remember on the same day and resume taking twice daily.  Do not take more than one dose of ELIQUIS at the same time to make up a missed dose.  Important Safety Information A possible side effect of Eliquis is bleeding. You should call your healthcare provider right away if you experience any of the following:   Bleeding from an injury or your nose that does not stop.   Unusual colored urine (red or dark brown) or unusual colored stools (red or black).   Unusual bruising for unknown reasons.   A serious fall or if you hit your head (even if there is no bleeding).  Some medicines may interact with Eliquis and might increase your risk of bleeding or  clotting while on Eliquis. To help avoid this, consult your healthcare provider or pharmacist prior to using any new prescription or non-prescription medications, including herbals, vitamins, non-steroidal anti-inflammatory drugs (NSAIDs) and supplements.  This website has more information on Eliquis (apixaban): [email or web address].no.

## 2013-06-17 NOTE — Plan of Care (Signed)
Problem: Discharge Progression Outcomes Goal: Able to perform self care activities Outcome: Completed/Met Date Met:  06/17/13 With aid assist. As needed.

## 2013-06-17 NOTE — Progress Notes (Signed)
       Patient Name: [name] Date of Encounter: 06/17/2013    SUBJECTIVE: Had a terrible night due to urinary retention. Probably a result of anesthesia on top of his known prostate and bladder disease.   TELEMETRY:  AV sequential pacing: Filed Vitals:   06/16/13 2125 06/17/13 0023 06/17/13 0622 06/17/13 0757  BP: 113/60 128/65 143/64   Pulse: 65 62 66   Temp: 97.8 F (36.6 C) 97.9 F (36.6 C) 97.8 F (36.6 C)   TempSrc: Oral Oral Oral   Resp: 18 16 18    Height:      Weight:   196 lb 4.8 oz (89.041 kg)   SpO2: 99% 97% 100% 98%    Intake/Output Summary (Last 24 hours) at 06/17/13 0828 Last data filed at 06/17/13 0700  Gross per 24 hour  Intake    720 ml  Output   1841 ml  Net  -1121 ml   Net I/O: -2606 liters since admission  LABS: Basic Metabolic Panel:  Recent Labs  06/16/13 0520 06/17/13 0355  NA 141 140  K 3.5* 3.7  CL 98 96  CO2 29 29  GLUCOSE 188* 150*  BUN 31* 34*  CREATININE 1.99* 2.06*  CALCIUM 8.6 9.3   CBC: No results found for this basename: WBC, NEUTROABS, HGB, HCT, MCV, PLT,  in the last 72 hours Cardiac Enzymes:  Recent Labs  06/14/13 1050 06/14/13 1705  TROPONINI <0.30 <0.30     Radiology/Studies:  No new  Physical Exam: Blood pressure 143/64, pulse 66, temperature 97.8 F (36.6 C), temperature source Oral, resp. rate 18, height 5\' 7"  (1.702 m), weight 196 lb 4.8 oz (89.041 kg), SpO2 98.00%. Weight change: -4 lb 12.8 oz (-2.177 kg)   Left basilar rales  ASSESSMENT:  1. Maintaining sinus rhythm after cardioversion 2. Diastolic heart failure is better, but still overloaded 3. Blood pressure is improved. 4. Urinary retention  Plan:  1. Okay for discharge id urinary retention resolved. 2. Demadex 60 mg BID 3. OV with me in 3-5 days with BMET   Signed, [name] 06/17/2013, 8:28 AM

## 2013-06-17 NOTE — Progress Notes (Signed)
Bladder scan done after pt voided only 100cc. [name] Pt c/o slight bladder discomfort. Bladder scan done 380 ml noted. K.Schorr made aware with order to do ion and out cath. When this RN went to the room, pt was sleeping. Continued to monitor. Will recheck bladder status when pt awakes.

## 2013-06-17 NOTE — Discharge Summary (Signed)
Physician Discharge Summary  [name] HBZ:[id number] DOB: [birth year] DOA: 06/13/2013  PCP: [name], MD  Admit date: 06/13/2013 Discharge date: 06/17/2013  Time spent: 40 minutes  Recommendations for Outpatient Follow-up:  1. Follow up with [name] in 1 week. 2. Follow up PCP in 2- 4 week.  Discharge Diagnoses:  Active Problems:   Hypertension   Diabetes mellitus   Acute respiratory failure   CKD (chronic kidney disease), stage III   Paroxysmal atrial fibrillation   Chronic anticoagulation   CHF exacerbation  Discharge Condition: stable  Diet recommendation: low sodium diet  Filed Weights   06/15/13 0538 06/16/13 0624 06/17/13 0622  Weight: 92.4 kg (203 lb 11.3 oz) 91.218 kg (201 lb 1.6 oz) 89.041 kg (196 lb 4.8 oz)    History of present illness:  80 y.o. male has a past medical history of CHF (congestive heart failure); Diabetes mellitus; Hyperlipidemia; Coronary artery disease; Spinal stenosis; Pneumonia; Pacemaker; Arthritis; Anemia; Carotid artery disease; PVD (peripheral vascular disease); Chronic diastolic congestive heart failure; Hyponatremia; CVA (cerebral infarction); CKD (chronic kidney disease) stage 3, GFR 30-59 ml/min; Long term (current) use of anticoagulants; Dysrhythmia; PSVT (paroxysmal supraventricular tachycardia); PAF (paroxysmal atrial fibrillation); AV block, 3rd degree; and Essential hypertension, benign. Dr. Kennon Holter was [place] a speech [place] [employer] last [place]. After his speech he developed shortness of breath. EMS arrived and noted that patient was hypoxic with O2 sat in 70's. His friend states his legs have been swollen for few days. He was seen by [name] on Thursday and had increased his dose of Demadex to 60 mg twice a day. This his Demadex was decreased to 60 mg in the morning 40 at night secondary to muscle cramps. His weight have been increasing lately in the past one to 2 days he gained at least 3 pounds. Patient recently was started on  amiodarone and feels that it has been contributing to generalized weakness.  Hospital Course:  Acute respiratory failure with hypoxia/Acute on chronic diastolic CHF exacerbation:  - Due to uncontrolled atrial fibrillation.  - S/p DCCV by cardiology  - Diurese about 2.5 L - will transition back to demadex 60mg  BID. - Estimated dry weight is around 88-89kg. - Low sodium diet fluid restricted.  Chronic anticoagulation:  - continue apixaban   Paroxysmal atrial fibrillation:  - continue amiodarone and metoprolol for now.  - s/p cardioversion. - rate controlled.  CKD (chronic kidney disease), stage III  - Stable and at baseline. - Close monitoring with diuresis.   Diabetes mellitus: - cont lantus and SSI at home.  Hypertension:  - soft, but stable. No orthostatic changes  - will monitor.    Procedures:  CXR 1.31.2014  ECHO:2.2.2014: The estimated ejection fraction was in the range of 45% to 50%. - Mitral valve: Mild regurgitation. - Left atrium: The atrium was mildly dilated. - Pulmonary arteries: PA peak pressure: 54mm Hg    Consultations:  Cardiology  Discharge Exam: Filed Vitals:   06/17/13 1028  BP: 132/70  Pulse:   Temp:   Resp:     General: A&O x3 Cardiovascular: RRR Respiratory: good air movement CTA B/L  Discharge Instructions      Discharge Orders   Future Appointments Provider Department Dept Phone   07/17/2013 9:00 AM [name], MD [facility] [phone]   Future Orders Complete By Expires   Diet - low sodium heart healthy  As directed    Increase activity slowly  As directed  Medication List         allopurinol 100 MG tablet  Commonly known as:  ZYLOPRIM  Take 100 mg by mouth at bedtime.     amiodarone 200 MG tablet  Commonly known as:  PACERONE  Take 1 tablet (200 mg total) by mouth 2 (two) times daily.     apixaban 2.5 MG Tabs tablet  Commonly known as:  ELIQUIS  Take 1 tablet (2.5 mg total) by  mouth 2 (two) times daily.     dutasteride 0.5 MG capsule  Commonly known as:  AVODART  Take 0.5 mg by mouth every morning.     ferrous sulfate 325 (65 FE) MG tablet  Take 325 mg by mouth every other day.     insulin glargine 100 UNIT/ML injection  Commonly known as:  LANTUS  Inject 12 Units into the skin at bedtime.     insulin lispro 100 UNIT/ML injection  Commonly known as:  HUMALOG  Inject 9-10 Units into the skin 3 (three) times daily before meals. 9 units with breakfast and lunch & 9 units after supper     losartan 100 MG tablet  Commonly known as:  COZAAR  Take 0.5 tablets (50 mg total) by mouth daily.     metoprolol tartrate 25 MG tablet  Commonly known as:  LOPRESSOR  Take 25 mg by mouth daily.     multivitamin with minerals Tabs tablet  Take 1 tablet by mouth daily. Diabetic pak by Petra Kuba from [employer]     polyethylene glycol packet  Commonly known as:  MIRALAX / GLYCOLAX  Take 17 g by mouth daily as needed. For constipation     potassium chloride SA 20 MEQ tablet  Commonly known as:  K-DUR,KLOR-CON  Take 20 mEq by mouth daily.     REFRESH OP  Place 1 drop into both eyes 2 (two) times daily. Refresh Gel Opth     silodosin 8 MG Caps capsule  Commonly known as:  RAPAFLO  Take 8 mg by mouth daily with breakfast.     SYSTANE 0.4-0.3 % Soln  Generic drug:  Polyethyl Glycol-Propyl Glycol  Apply 1 drop to eye daily. Into affected eyes     torsemide 20 MG tablet  Commonly known as:  DEMADEX  Take 60 mg by mouth 2 (two) times daily. Takes 3 in the am 3 in the pm     ZIOPTAN 0.0015 % Soln  Generic drug:  Tafluprost  Place 1 drop into both eyes every other day. Use at night       Allergies  Allergen Reactions  . Aggrenox [Aspirin-Dipyridamole Er] Other (See Comments)    Dizziness  . Carvedilol Other (See Comments)    Dizziness  . Claritin [Loratadine] Other (See Comments)    Dizziness & drowsiness   . Mucinex [Guaifenesin Er] Other (See Comments)     Dizziness, drowsiness  . Plavix [Clopidogrel Bisulfate] Other (See Comments)    Dizziness  . Rapaflo [Silodosin] Other (See Comments)    Dizziness  . Statins Other (See Comments)    rhabdomyolisis  . Travatan Z [Travoprost] Other (See Comments)    Eye irritation   Follow-up Information   Follow up with [name], MD In 1 week. (hospital follow up)    Specialty:  Internal Medicine   Contact information:   [address] Hartmann [place] [address] [phone]       Follow up with [name], MD In 1 week. (hospital follow up in  1 week.)    Specialty:  Cardiology   Contact information:   [id number] [place] [address] [phone]        The results of significant diagnostics from this hospitalization (including imaging, microbiology, ancillary and laboratory) are listed below for reference.    Significant Diagnostic Studies: Dg Chest Port 1 View  06/14/2013   CLINICAL DATA:  dyspnea  EXAM: PORTABLE CHEST - 1 VIEW  COMPARISON:  DG CHEST 1V PORT dated 05/06/2013  FINDINGS: Exam is lordotic. Cardiac silhouette is mildly enlarged. There is central venous pulmonary congestion. No overt pulmonary edema. No pneumothorax.  IMPRESSION: Cardiomegaly and central venous congestion.   Electronically Signed   By: Suzy Bouchard M.D.   On: 06/14/2013 00:48    Microbiology: No results found for this or any previous visit (from the past 240 hour(s)).   Labs: Basic Metabolic Panel:  Recent Labs Lab 06/13/13 2340 06/15/13 0556 06/16/13 0520 06/17/13 0355  NA 140 139 141 140  K 4.4 3.6* 3.5* 3.7  CL 95* 96 98 96  CO2 31 31 29 29   GLUCOSE 113* 181* 188* 150*  BUN 33* 32* 31* 34*  CREATININE 2.08* 2.05* 1.99* 2.06*  CALCIUM 9.3 8.8 8.6 9.3   Liver Function Tests: No results found for this basename: AST, ALT, ALKPHOS, BILITOT, PROT, ALBUMIN,  in the last 168 hours No results found for this basename: LIPASE, AMYLASE,  in the last 168  hours No results found for this basename: AMMONIA,  in the last 168 hours CBC:  Recent Labs Lab 06/13/13 2340  WBC 7.1  NEUTROABS 5.1  HGB 12.0*  HCT 35.6*  MCV 97.3  PLT 211   Cardiac Enzymes:  Recent Labs Lab 06/13/13 2340 06/14/13 0745 06/14/13 1050 06/14/13 1705  TROPONINI <0.30 <0.30 <0.30 <0.30   BNP: BNP (last 3 results)  Recent Labs  12/01/12 0410 05/06/13 0348 06/13/13 2340  PROBNP 1354.0* 5209.0* 4453.0*   CBG:  Recent Labs Lab 06/16/13 0648 06/16/13 1106 06/16/13 1624 06/16/13 2121 06/17/13 0656  GLUCAP 156* 152* 191* 195* 169*       Signed:  Charlynne Cousins  [facility] 06/17/2013, 11:03 AM

## 2013-06-17 NOTE — Progress Notes (Signed)
All d/c instructions explained and given to pt and verbalized understanding.  Left floor via w/c to awaiting transport.  Karie Kirks, Therapist, sports.

## 2013-06-17 NOTE — Progress Notes (Signed)
All d/c instructions explained and given to pt, verbalized understanding.  Escorted off floor to home by son and care taker.  Karie Kirks, Therapist, sports.

## 2013-06-17 NOTE — Progress Notes (Addendum)
Pt woke up feels bladder discomfort. Attempted to void unsuccessfully. Bladder scan done 569ml noted . In and out cath done as ordered. Obtained 500 cc amber colored clear urine. Tolerated procedure well.

## 2013-06-18 ENCOUNTER — Telehealth: Payer: Self-pay | Admitting: Interventional Cardiology

## 2013-06-18 ENCOUNTER — Emergency Department (HOSPITAL_COMMUNITY)
Admission: EM | Admit: 2013-06-18 | Discharge: 2013-06-18 | Disposition: A | Discharge status: Home / Self Care | Payer: Medicare Other | Source: Home / Self Care | Attending: Emergency Medicine | Admitting: Emergency Medicine

## 2013-06-18 ENCOUNTER — Encounter (HOSPITAL_COMMUNITY): Payer: Self-pay | Admitting: Cardiology

## 2013-06-18 ENCOUNTER — Encounter (HOSPITAL_COMMUNITY): Payer: Self-pay | Admitting: Emergency Medicine

## 2013-06-18 ENCOUNTER — Telehealth: Payer: Self-pay

## 2013-06-18 ENCOUNTER — Ambulatory Visit: Payer: Medicare Other | Admitting: Interventional Cardiology

## 2013-06-18 ENCOUNTER — Inpatient Hospital Stay (HOSPITAL_COMMUNITY)
Admission: EM | Admit: 2013-06-18 | Discharge: 2013-06-22 | DRG: 291 | Disposition: A | Discharge status: Home / Self Care | Payer: Medicare Other | Attending: Cardiology | Admitting: Cardiology

## 2013-06-18 ENCOUNTER — Emergency Department (HOSPITAL_COMMUNITY): Payer: Medicare Other

## 2013-06-18 DIAGNOSIS — Z8701 Personal history of pneumonia (recurrent): Secondary | ICD-10-CM | POA: Insufficient documentation

## 2013-06-18 DIAGNOSIS — Z7901 Long term (current) use of anticoagulants: Secondary | ICD-10-CM

## 2013-06-18 DIAGNOSIS — I2510 Atherosclerotic heart disease of native coronary artery without angina pectoris: Secondary | ICD-10-CM | POA: Insufficient documentation

## 2013-06-18 DIAGNOSIS — M129 Arthropathy, unspecified: Secondary | ICD-10-CM

## 2013-06-18 DIAGNOSIS — N183 Chronic kidney disease, stage 3 unspecified: Secondary | ICD-10-CM | POA: Insufficient documentation

## 2013-06-18 DIAGNOSIS — Z79899 Other long term (current) drug therapy: Secondary | ICD-10-CM

## 2013-06-18 DIAGNOSIS — Z794 Long term (current) use of insulin: Secondary | ICD-10-CM | POA: Insufficient documentation

## 2013-06-18 DIAGNOSIS — Z950 Presence of cardiac pacemaker: Secondary | ICD-10-CM

## 2013-06-18 DIAGNOSIS — I471 Supraventricular tachycardia, unspecified: Secondary | ICD-10-CM | POA: Diagnosis present

## 2013-06-18 DIAGNOSIS — R339 Retention of urine, unspecified: Secondary | ICD-10-CM | POA: Diagnosis present

## 2013-06-18 DIAGNOSIS — R141 Gas pain: Secondary | ICD-10-CM

## 2013-06-18 DIAGNOSIS — Z8673 Personal history of transient ischemic attack (TIA), and cerebral infarction without residual deficits: Secondary | ICD-10-CM | POA: Insufficient documentation

## 2013-06-18 DIAGNOSIS — E1142 Type 2 diabetes mellitus with diabetic polyneuropathy: Secondary | ICD-10-CM | POA: Diagnosis present

## 2013-06-18 DIAGNOSIS — N401 Enlarged prostate with lower urinary tract symptoms: Secondary | ICD-10-CM | POA: Insufficient documentation

## 2013-06-18 DIAGNOSIS — I739 Peripheral vascular disease, unspecified: Secondary | ICD-10-CM | POA: Diagnosis present

## 2013-06-18 DIAGNOSIS — I5031 Acute diastolic (congestive) heart failure: Secondary | ICD-10-CM

## 2013-06-18 DIAGNOSIS — I5032 Chronic diastolic (congestive) heart failure: Secondary | ICD-10-CM

## 2013-06-18 DIAGNOSIS — I4891 Unspecified atrial fibrillation: Secondary | ICD-10-CM | POA: Insufficient documentation

## 2013-06-18 DIAGNOSIS — Z96659 Presence of unspecified artificial knee joint: Secondary | ICD-10-CM

## 2013-06-18 DIAGNOSIS — I509 Heart failure, unspecified: Secondary | ICD-10-CM

## 2013-06-18 DIAGNOSIS — E119 Type 2 diabetes mellitus without complications: Secondary | ICD-10-CM | POA: Diagnosis present

## 2013-06-18 DIAGNOSIS — R142 Eructation: Secondary | ICD-10-CM

## 2013-06-18 DIAGNOSIS — I5033 Acute on chronic diastolic (congestive) heart failure: Principal | ICD-10-CM | POA: Diagnosis present

## 2013-06-18 DIAGNOSIS — I10 Essential (primary) hypertension: Secondary | ICD-10-CM | POA: Insufficient documentation

## 2013-06-18 DIAGNOSIS — R338 Other retention of urine: Secondary | ICD-10-CM

## 2013-06-18 DIAGNOSIS — E785 Hyperlipidemia, unspecified: Secondary | ICD-10-CM | POA: Diagnosis present

## 2013-06-18 DIAGNOSIS — N189 Chronic kidney disease, unspecified: Secondary | ICD-10-CM

## 2013-06-18 DIAGNOSIS — I491 Atrial premature depolarization: Secondary | ICD-10-CM | POA: Insufficient documentation

## 2013-06-18 DIAGNOSIS — I480 Paroxysmal atrial fibrillation: Secondary | ICD-10-CM | POA: Diagnosis present

## 2013-06-18 DIAGNOSIS — Z7982 Long term (current) use of aspirin: Secondary | ICD-10-CM

## 2013-06-18 DIAGNOSIS — I129 Hypertensive chronic kidney disease with stage 1 through stage 4 chronic kidney disease, or unspecified chronic kidney disease: Secondary | ICD-10-CM

## 2013-06-18 DIAGNOSIS — Z7902 Long term (current) use of antithrombotics/antiplatelets: Secondary | ICD-10-CM

## 2013-06-18 DIAGNOSIS — N138 Other obstructive and reflux uropathy: Secondary | ICD-10-CM | POA: Insufficient documentation

## 2013-06-18 DIAGNOSIS — D649 Anemia, unspecified: Secondary | ICD-10-CM | POA: Diagnosis present

## 2013-06-18 DIAGNOSIS — J9600 Acute respiratory failure, unspecified whether with hypoxia or hypercapnia: Secondary | ICD-10-CM | POA: Diagnosis present

## 2013-06-18 DIAGNOSIS — R143 Flatulence: Secondary | ICD-10-CM

## 2013-06-18 LAB — URINALYSIS, ROUTINE W REFLEX MICROSCOPIC
BILIRUBIN URINE: NEGATIVE
GLUCOSE, UA: NEGATIVE mg/dL
Hgb urine dipstick: NEGATIVE
KETONES UR: NEGATIVE mg/dL
LEUKOCYTES UA: NEGATIVE
NITRITE: NEGATIVE
PH: 5 (ref 5.0–8.0)
PROTEIN: NEGATIVE mg/dL
Specific Gravity, Urine: 1.015 (ref 1.005–1.030)
Urobilinogen, UA: 0.2 mg/dL (ref 0.0–1.0)

## 2013-06-18 LAB — CBC WITH DIFFERENTIAL/PLATELET
BASOS ABS: 0 10*3/uL (ref 0.0–0.1)
BASOS PCT: 0 % (ref 0–1)
Eosinophils Absolute: 0.1 10*3/uL (ref 0.0–0.7)
Eosinophils Relative: 1 % (ref 0–5)
HEMATOCRIT: 32.9 % — AB (ref 39.0–52.0)
Hemoglobin: 11 g/dL — ABNORMAL LOW (ref 13.0–17.0)
Lymphocytes Relative: 20 % (ref 12–46)
Lymphs Abs: 1.5 10*3/uL (ref 0.7–4.0)
MCH: 32.4 pg (ref 26.0–34.0)
MCHC: 33.4 g/dL (ref 30.0–36.0)
MCV: 96.8 fL (ref 78.0–100.0)
Monocytes Absolute: 0.9 10*3/uL (ref 0.1–1.0)
Monocytes Relative: 12 % (ref 3–12)
NEUTROS ABS: 5 10*3/uL (ref 1.7–7.7)
Neutrophils Relative %: 68 % (ref 43–77)
Platelets: 213 10*3/uL (ref 150–400)
RBC: 3.4 MIL/uL — ABNORMAL LOW (ref 4.22–5.81)
RDW: 14.9 % (ref 11.5–15.5)
WBC: 7.4 10*3/uL (ref 4.0–10.5)

## 2013-06-18 LAB — POCT I-STAT 3, ART BLOOD GAS (G3+)
Acid-Base Excess: 7 mmol/L — ABNORMAL HIGH (ref 0.0–2.0)
Bicarbonate: 32.5 mEq/L — ABNORMAL HIGH (ref 20.0–24.0)
O2 Saturation: 97 %
PH ART: 7.439 (ref 7.350–7.450)
PO2 ART: 85 mmHg (ref 80.0–100.0)
TCO2: 34 mmol/L (ref 0–100)
pCO2 arterial: 47.9 mmHg — ABNORMAL HIGH (ref 35.0–45.0)

## 2013-06-18 LAB — COMPREHENSIVE METABOLIC PANEL
ALBUMIN: 3.3 g/dL — AB (ref 3.5–5.2)
ALT: 27 U/L (ref 0–53)
AST: 33 U/L (ref 0–37)
Alkaline Phosphatase: 57 U/L (ref 39–117)
BUN: 36 mg/dL — ABNORMAL HIGH (ref 6–23)
CALCIUM: 9 mg/dL (ref 8.4–10.5)
CO2: 28 mEq/L (ref 19–32)
Chloride: 98 mEq/L (ref 96–112)
Creatinine, Ser: 2.2 mg/dL — ABNORMAL HIGH (ref 0.50–1.35)
GFR calc Af Amer: 28 mL/min — ABNORMAL LOW (ref >90)
GFR calc non Af Amer: 24 mL/min — ABNORMAL LOW (ref >90)
Glucose, Bld: 111 mg/dL — ABNORMAL HIGH (ref 70–99)
Potassium: 4.1 mEq/L (ref 3.7–5.3)
Sodium: 141 mEq/L (ref 137–147)
Total Bilirubin: 0.4 mg/dL (ref 0.3–1.2)
Total Protein: 7.2 g/dL (ref 6.0–8.3)

## 2013-06-18 LAB — TROPONIN I

## 2013-06-18 LAB — MAGNESIUM: Magnesium: 2.4 mg/dL (ref 1.5–2.5)

## 2013-06-18 LAB — GLUCOSE, CAPILLARY: Glucose-Capillary: 174 mg/dL — ABNORMAL HIGH (ref 70–99)

## 2013-06-18 LAB — PRO B NATRIURETIC PEPTIDE: Pro B Natriuretic peptide (BNP): 3943 pg/mL — ABNORMAL HIGH (ref 0–450)

## 2013-06-18 LAB — POCT I-STAT TROPONIN I: TROPONIN I, POC: 0.18 ng/mL — AB (ref 0.00–0.08)

## 2013-06-18 LAB — CG4 I-STAT (LACTIC ACID): LACTIC ACID, VENOUS: 0.87 mmol/L (ref 0.5–2.2)

## 2013-06-18 MED ORDER — TORSEMIDE 20 MG PO TABS
60.0000 mg | ORAL_TABLET | Freq: Two times a day (BID) | ORAL | Status: DC
  Administered 2013-06-19: 60 mg via ORAL
  Filled 2013-06-18 (×3): qty 3

## 2013-06-18 MED ORDER — POLYVINYL ALCOHOL 1.4 % OP SOLN
1.0000 [drp] | Freq: Every day | OPHTHALMIC | Status: DC
  Filled 2013-06-18: qty 15

## 2013-06-18 MED ORDER — ADULT MULTIVITAMIN W/MINERALS CH
1.0000 | ORAL_TABLET | Freq: Every day | ORAL | Status: DC
  Administered 2013-06-19 – 2013-06-22 (×4): 1 via ORAL
  Filled 2013-06-18 (×4): qty 1

## 2013-06-18 MED ORDER — METOPROLOL TARTRATE 25 MG PO TABS
25.0000 mg | ORAL_TABLET | Freq: Every day | ORAL | Status: DC
  Administered 2013-06-19 – 2013-06-22 (×4): 25 mg via ORAL
  Filled 2013-06-18 (×4): qty 1

## 2013-06-18 MED ORDER — NITROGLYCERIN 0.4 MG SL SUBL: 0.4000 mg | SUBLINGUAL_TABLET | SUBLINGUAL | Status: DC | PRN

## 2013-06-18 MED ORDER — FUROSEMIDE 10 MG/ML IJ SOLN
60.0000 mg | Freq: Once | INTRAMUSCULAR | Status: AC
  Administered 2013-06-18: 60 mg via INTRAVENOUS
  Filled 2013-06-18 (×2): qty 6

## 2013-06-18 MED ORDER — ASPIRIN 325 MG PO TABS
325.0000 mg | ORAL_TABLET | Freq: Once | ORAL | Status: AC
  Administered 2013-06-18: 325 mg via ORAL
  Filled 2013-06-18: qty 1

## 2013-06-18 MED ORDER — POLYETHYL GLYCOL-PROPYL GLYCOL 0.4-0.3 % OP SOLN: 1.0000 [drp] | Freq: Every day | OPHTHALMIC | Status: DC

## 2013-06-18 MED ORDER — INSULIN GLARGINE 100 UNIT/ML ~~LOC~~ SOLN
9.0000 [IU] | Freq: Every day | SUBCUTANEOUS | Status: DC
  Administered 2013-06-18 – 2013-06-21 (×4): 9 [IU] via SUBCUTANEOUS
  Filled 2013-06-18 (×5): qty 0.09

## 2013-06-18 MED ORDER — SODIUM CHLORIDE 0.9 % IJ SOLN: 3.0000 mL | INTRAMUSCULAR | Status: DC | PRN

## 2013-06-18 MED ORDER — SODIUM CHLORIDE 0.9 % IV SOLN: 250.0000 mL | INTRAVENOUS | Status: DC | PRN

## 2013-06-18 MED ORDER — ONDANSETRON HCL 4 MG/2ML IJ SOLN: 4.0000 mg | Freq: Four times a day (QID) | INTRAMUSCULAR | Status: DC | PRN

## 2013-06-18 MED ORDER — ACETAMINOPHEN 325 MG PO TABS: 650.0000 mg | ORAL_TABLET | ORAL | Status: DC | PRN

## 2013-06-18 MED ORDER — LATANOPROST 0.005 % OP SOLN
1.0000 [drp] | Freq: Every day | OPHTHALMIC | Status: DC
  Administered 2013-06-19 – 2013-06-20 (×2): 1 [drp] via OPHTHALMIC
  Filled 2013-06-18: qty 2.5

## 2013-06-18 MED ORDER — TAMSULOSIN HCL 0.4 MG PO CAPS
0.4000 mg | ORAL_CAPSULE | Freq: Every day | ORAL | Status: DC
  Filled 2013-06-18: qty 1

## 2013-06-18 MED ORDER — FERROUS SULFATE 325 (65 FE) MG PO TABS
325.0000 mg | ORAL_TABLET | ORAL | Status: DC
  Administered 2013-06-19 – 2013-06-21 (×2): 325 mg via ORAL
  Filled 2013-06-18 (×2): qty 1

## 2013-06-18 MED ORDER — POTASSIUM CHLORIDE CRYS ER 20 MEQ PO TBCR
20.0000 meq | EXTENDED_RELEASE_TABLET | Freq: Every day | ORAL | Status: DC
  Administered 2013-06-19 – 2013-06-22 (×4): 20 meq via ORAL
  Filled 2013-06-18 (×4): qty 1

## 2013-06-18 MED ORDER — LOSARTAN POTASSIUM 50 MG PO TABS
50.0000 mg | ORAL_TABLET | Freq: Every day | ORAL | Status: DC
  Administered 2013-06-18 – 2013-06-19 (×2): 50 mg via ORAL
  Filled 2013-06-18 (×2): qty 1

## 2013-06-18 MED ORDER — POLYETHYLENE GLYCOL 3350 17 G PO PACK
17.0000 g | PACK | Freq: Every day | ORAL | Status: DC | PRN
  Administered 2013-06-21: 17 g via ORAL
  Filled 2013-06-18: qty 1

## 2013-06-18 MED ORDER — SILODOSIN 8 MG PO CAPS
8.0000 mg | ORAL_CAPSULE | Freq: Every day | ORAL | Status: DC
  Filled 2013-06-18 (×3): qty 1

## 2013-06-18 MED ORDER — IPRATROPIUM-ALBUTEROL 0.5-2.5 (3) MG/3ML IN SOLN
3.0000 mL | Freq: Once | RESPIRATORY_TRACT | Status: AC
  Administered 2013-06-18: 3 mL via RESPIRATORY_TRACT
  Filled 2013-06-18: qty 3

## 2013-06-18 MED ORDER — DUTASTERIDE 0.5 MG PO CAPS
0.5000 mg | ORAL_CAPSULE | Freq: Every morning | ORAL | Status: DC
  Administered 2013-06-19 – 2013-06-22 (×4): 0.5 mg via ORAL
  Filled 2013-06-18 (×4): qty 1

## 2013-06-18 MED ORDER — FUROSEMIDE 10 MG/ML IJ SOLN
60.0000 mg | Freq: Once | INTRAMUSCULAR | Status: AC
  Administered 2013-06-18: 60 mg via INTRAVENOUS
  Filled 2013-06-18: qty 6

## 2013-06-18 MED ORDER — APIXABAN 2.5 MG PO TABS
2.5000 mg | ORAL_TABLET | Freq: Two times a day (BID) | ORAL | Status: DC
  Administered 2013-06-18 – 2013-06-22 (×8): 2.5 mg via ORAL
  Filled 2013-06-18 (×9): qty 1

## 2013-06-18 MED ORDER — ALLOPURINOL 100 MG PO TABS
100.0000 mg | ORAL_TABLET | Freq: Every day | ORAL | Status: DC
  Administered 2013-06-18 – 2013-06-21 (×4): 100 mg via ORAL
  Filled 2013-06-18 (×5): qty 1

## 2013-06-18 MED ORDER — SODIUM CHLORIDE 0.9 % IJ SOLN
3.0000 mL | Freq: Two times a day (BID) | INTRAMUSCULAR | Status: DC
  Administered 2013-06-18 – 2013-06-22 (×7): 3 mL via INTRAVENOUS

## 2013-06-18 NOTE — ED Notes (Signed)
Leg bag applied to pt right leg.  Pt did not need instructions as he has had one several times in the past.

## 2013-06-18 NOTE — Telephone Encounter (Signed)
New message  Patient's caregiver called in that patient was experiencing chest pain and SOB. Call was sent to Horton Chin in Triage.

## 2013-06-18 NOTE — Telephone Encounter (Signed)
[name], caregiver for Dr. Kennon Holter, calls b/c pt started becoming very short of breath upon ambulating from the chair to the bathroom & back. States this resolves after he sits down. She is quite concerned about this as pt was in the ED this am for urinary retention & has an indwelling catheter at this time.  Weight is 189 at home. Pt has taken his morning Demadex EMS has come out to evaluate pt & she states he is okay at this time.  [name] has reviewed & if pt shortness of breath does not improve pt may need to go back to the hospital [name] is aware  Horton Chin RN

## 2013-06-18 NOTE — ED Provider Notes (Signed)
CSN: [id number]     Arrival date & time 06/18/13  0417 History   First MD Initiated Contact with Patient 06/18/13 0440     Chief Complaint  Patient presents with  . Urinary Retention   (Consider location/radiation/quality/duration/timing/severity/associated sxs/prior Treatment) HPI 80 year old male presents to emergency room with complaint of urinary retention.  Patient recently discharged from the hospital yesterday after CHF exacerbation.  Since discharge home, he has not been able to urinate more than a few cc.  Patient has history of enlarged prostate, reports that he was on Flomax in the hospital, but normally takes Rapaflo.  He reports he has had problems in the past with Flomax.  He denies any fever or chills.  No worsening dyspnea aside from his chronic shortness of breath.  He describes a lower abdominal fullness and distention. Past Medical History  Diagnosis Date  . Diabetes mellitus   . Hyperlipidemia   . Coronary artery disease   . Spinal stenosis   . Pneumonia   . Pacemaker   . Anemia   . Carotid artery disease   . PVD (peripheral vascular disease)   . Hyponatremia     Chronic  . CVA (cerebral infarction)   . CKD (chronic kidney disease) stage 3, GFR 30-59 ml/min   . Long term (current) use of anticoagulants     No bleeding on Eliquis  . Dysrhythmia   . PSVT (paroxysmal supraventricular tachycardia)   . PAF (paroxysmal atrial fibrillation)     Asymptomatic. On Eliquis.  . AV block, 3rd degree     With DDD St. Jude PM, initially placed in 1993 by Dr. Nils Pyle. Device upgrade 10/2002 to DDD, by Dr. Rollene Fare complicated by bleeding.  . Essential hypertension, benign   . CHF (congestive heart failure)   . Chronic diastolic congestive heart failure     ECHO 05/20/12 LVEF estimated by 2D at 55-60%  . Arthritis    Past Surgical History  Procedure Laterality Date  . Lumbar laminectomy  1995  . Back surgery    . Total knee arthroplasty Left   . Quadriceps tendon repair     . Cataract extraction    . Carotid endarterectomy    . Yag laser application Right 4/80/1655    Procedure: YAG LASER CAPSULOTOMY OF RIGHT EYE;  Surgeon: [name], MD;  Location: [facility];  Service: Ophthalmology;  Laterality: Right;  . Tonsillectomy    . Insert / replace / remove pacemaker      DDD St. Jude PM, initially placed in 1993 by Dr. Nils Pyle. Device upgrade 10/2002 to DDD, by Dr. Rollene Fare complicated by bleeding.  . Carotid endarterectomy Right 2006  . Transurethral resection of prostate     Family History  Problem Relation Age of Onset  . Multiple myeloma Father   . Hypertension Mother    History  Substance Use Topics  . Smoking status: Never Smoker   . Smokeless tobacco: Never Used  . Alcohol Use: No     Comment: Cocktails occasionally    Review of Systems  See History of Present Illness; otherwise all other systems are reviewed and negative Allergies  Aggrenox; Carvedilol; Claritin; Mucinex; Plavix; Rapaflo; Statins; and Travatan z  Home Medications   Current Outpatient Rx  Name  Route  Sig  Dispense  Refill  . allopurinol (ZYLOPRIM) 100 MG tablet   Oral   Take 100 mg by mouth at bedtime.         [name] amiodarone (PACERONE) 200 MG tablet  Oral   Take 1 tablet (200 mg total) by mouth 2 (two) times daily.   60 tablet   11   . apixaban (ELIQUIS) 2.5 MG TABS tablet   Oral   Take 1 tablet (2.5 mg total) by mouth 2 (two) times daily.   60 tablet   1   . dutasteride (AVODART) 0.5 MG capsule   Oral   Take 0.5 mg by mouth every morning.          . ferrous sulfate 325 (65 FE) MG tablet   Oral   Take 325 mg by mouth every other day.          . insulin glargine (LANTUS) 100 UNIT/ML injection   Subcutaneous   Inject 12 Units into the skin at bedtime.          . insulin lispro (HUMALOG) 100 UNIT/ML injection   Subcutaneous   Inject 9-10 Units into the skin 3 (three) times daily before meals. 9 units with breakfast and lunch & 9 units  after supper         . losartan (COZAAR) 100 MG tablet   Oral   Take 0.5 tablets (50 mg total) by mouth daily.         . metoprolol tartrate (LOPRESSOR) 25 MG tablet   Oral   Take 25 mg by mouth daily.         . Multiple Vitamin (MULTIVITAMIN WITH MINERALS) TABS   Oral   Take 1 tablet by mouth daily. Diabetic pak by Petra Kuba from [employer]         . Polyethyl Glycol-Propyl Glycol (SYSTANE) 0.4-0.3 % SOLN   Ophthalmic   Apply 1 drop to eye daily. Into affected eyes         . polyethylene glycol (MIRALAX / GLYCOLAX) packet   Oral   Take 17 g by mouth daily as needed. For constipation         . Polyvinyl Alcohol-Povidone (REFRESH OP)   Both Eyes   Place 1 drop into both eyes 2 (two) times daily. Refresh Gel Opth         . potassium chloride SA (K-DUR,KLOR-CON) 20 MEQ tablet   Oral   Take 20 mEq by mouth daily.         . silodosin (RAPAFLO) 8 MG CAPS capsule   Oral   Take 8 mg by mouth daily with breakfast.         . Tafluprost (ZIOPTAN) 0.0015 % SOLN   Both Eyes   Place 1 drop into both eyes every other day. Use at night         . torsemide (DEMADEX) 20 MG tablet   Oral   Take 60 mg by mouth 2 (two) times daily. Takes 3 in the am 3 in the pm          BP 154/73  Temp(Src) 98.6 F (37 C) (Oral)  Resp 22  Ht '5\' 7"'  (1.702 m)  Wt 191 lb (86.637 kg)  BMI 29.91 kg/m2  SpO2 92% Physical Exam  Nursing note and vitals reviewed. Constitutional: He is oriented to person, place, and time. He appears well-developed and well-nourished. No distress.  HENT:  Head: Normocephalic and atraumatic.  Nose: Nose normal.  Mouth/Throat: Oropharynx is clear and moist.  Eyes: Conjunctivae and EOM are normal. Pupils are equal, round, and reactive to light.  Neck: Normal range of motion. Neck supple. No JVD present. No tracheal deviation present. No thyromegaly present.  Cardiovascular: Normal rate,  regular rhythm, normal heart sounds and intact distal pulses.  Exam  reveals no gallop and no friction rub.   No murmur heard. Pulmonary/Chest: Effort normal. No stridor. No respiratory distress. He has wheezes (mild). He has no rales. He exhibits no tenderness.  Abdominal: Soft. Bowel sounds are normal. He exhibits distension (mild suprapubic distention). He exhibits no mass. There is tenderness (mild suprapubic tenderness). There is no rebound and no guarding.  Musculoskeletal: Normal range of motion. He exhibits edema. He exhibits no tenderness.  Lymphadenopathy:    He has no cervical adenopathy.  Neurological: He is alert and oriented to person, place, and time. He exhibits normal muscle tone. Coordination normal.  Skin: Skin is warm and dry. No rash noted. No erythema. No pallor.  Psychiatric: He has a normal mood and affect. His behavior is normal. Judgment and thought content normal.    ED Course  Procedures (including critical care time) Labs Review Labs Reviewed  URINALYSIS, ROUTINE W REFLEX MICROSCOPIC   Imaging Review No results found.  EKG Interpretation   None       MDM   1. Acute urinary retention   2. BPH (benign prostatic hypertrophy) with urinary retention    80 year old male with urinary retention.  After placement of the catheter, he has had about 400 cc of urine out.  He is feeling much better.  Plan is to discharge him home to follow up with his urologist in the office in the next week    Kalman Drape, MD 06/18/13 [phone]

## 2013-06-18 NOTE — ED Notes (Signed)
Had cardioversion completed yesterday, unable to void more than "a cc or two" since 1530.

## 2013-06-18 NOTE — ED Provider Notes (Signed)
CSN: [id number]     Arrival date & time 06/18/13  1231 History   First MD Initiated Contact with Patient 06/18/13 1242     Chief Complaint  Patient presents with  . Respiratory Distress   (Consider location/radiation/quality/duration/timing/severity/associated sxs/prior Treatment) The history is provided by the patient.  DOMANICK CUCCIA is a 80 y.o. male hx of DM, HL, CAD, CVA here with SOB. Was recently admitted for CHF exacerbation and was discharged 2 days ago. Came in yesterday for urinary retention and Foley was placed. He was sent home this morning from the ER. Afterwards had worse short of breath. Denies any cough or fever.    Past Medical History  Diagnosis Date  . Diabetes mellitus   . Hyperlipidemia   . Coronary artery disease   . Spinal stenosis   . Pneumonia   . Pacemaker   . Anemia   . Carotid artery disease   . PVD (peripheral vascular disease)   . Hyponatremia     Chronic  . CVA (cerebral infarction)   . CKD (chronic kidney disease) stage 3, GFR 30-59 ml/min   . Long term (current) use of anticoagulants     No bleeding on Eliquis  . Dysrhythmia   . PSVT (paroxysmal supraventricular tachycardia)   . PAF (paroxysmal atrial fibrillation)     Asymptomatic. On Eliquis.  . AV block, 3rd degree     With DDD St. Jude PM, initially placed in 1993 by Dr. Nils Pyle. Device upgrade 10/2002 to DDD, by Dr. Rollene Fare complicated by bleeding.  . Essential hypertension, benign   . CHF (congestive heart failure)   . Chronic diastolic congestive heart failure     ECHO 05/20/12 LVEF estimated by 2D at 55-60%  . Arthritis    Past Surgical History  Procedure Laterality Date  . Lumbar laminectomy  1995  . Back surgery    . Total knee arthroplasty Left   . Quadriceps tendon repair    . Cataract extraction    . Carotid endarterectomy    . Yag laser application Right 05/23/3157    Procedure: YAG LASER CAPSULOTOMY OF RIGHT EYE;  Surgeon: [name], MD;  Location: [facility];   Service: Ophthalmology;  Laterality: Right;  . Tonsillectomy    . Insert / replace / remove pacemaker      DDD St. Jude PM, initially placed in 1993 by Dr. Nils Pyle. Device upgrade 10/2002 to DDD, by Dr. Rollene Fare complicated by bleeding.  . Carotid endarterectomy Right 2006  . Transurethral resection of prostate     Family History  Problem Relation Age of Onset  . Multiple myeloma Father   . Hypertension Mother    History  Substance Use Topics  . Smoking status: Never Smoker   . Smokeless tobacco: Never Used  . Alcohol Use: No     Comment: Cocktails occasionally    Review of Systems  Respiratory: Positive for shortness of breath.   All other systems reviewed and are negative.    Allergies  Aggrenox; Carvedilol; Claritin; Mucinex; Plavix; Rapaflo; Statins; and Travatan z  Home Medications   Current Outpatient Rx  Name  Route  Sig  Dispense  Refill  . allopurinol (ZYLOPRIM) 100 MG tablet   Oral   Take 100 mg by mouth at bedtime.         [name] amiodarone (PACERONE) 200 MG tablet   Oral   Take 1 tablet (200 mg total) by mouth 2 (two) times daily.   60 tablet  11   . apixaban (ELIQUIS) 2.5 MG TABS tablet   Oral   Take 1 tablet (2.5 mg total) by mouth 2 (two) times daily.   60 tablet   1   . dutasteride (AVODART) 0.5 MG capsule   Oral   Take 0.5 mg by mouth every morning.          . ferrous sulfate 325 (65 FE) MG tablet   Oral   Take 325 mg by mouth every other day.          . insulin glargine (LANTUS) 100 UNIT/ML injection   Subcutaneous   Inject 12 Units into the skin at bedtime.          . insulin lispro (HUMALOG) 100 UNIT/ML injection   Subcutaneous   Inject 9-10 Units into the skin 3 (three) times daily before meals. 9 units with breakfast and lunch & 9 units after supper         . losartan (COZAAR) 100 MG tablet   Oral   Take 0.5 tablets (50 mg total) by mouth daily.         . metoprolol tartrate (LOPRESSOR) 25 MG tablet   Oral   Take 25  mg by mouth daily.         . Multiple Vitamin (MULTIVITAMIN WITH MINERALS) TABS   Oral   Take 1 tablet by mouth daily. Diabetic pak by Petra Kuba from [employer]         . Polyethyl Glycol-Propyl Glycol (SYSTANE) 0.4-0.3 % SOLN   Ophthalmic   Apply 1 drop to eye daily. Into affected eyes         . polyethylene glycol (MIRALAX / GLYCOLAX) packet   Oral   Take 17 g by mouth daily as needed. For constipation         . Polyvinyl Alcohol-Povidone (REFRESH OP)   Both Eyes   Place 1 drop into both eyes 2 (two) times daily. Refresh Gel Opth         . potassium chloride SA (K-DUR,KLOR-CON) 20 MEQ tablet   Oral   Take 20 mEq by mouth daily.         . silodosin (RAPAFLO) 8 MG CAPS capsule   Oral   Take 8 mg by mouth daily with breakfast.         . Tafluprost (ZIOPTAN) 0.0015 % SOLN   Both Eyes   Place 1 drop into both eyes every other day. Use at night         . torsemide (DEMADEX) 20 MG tablet   Oral   Take 60 mg by mouth 2 (two) times daily. Takes 3 in the am 3 in the pm          BP 133/67  Pulse 66  Resp 27  SpO2 100% Physical Exam  Nursing note and vitals reviewed. Constitutional: He is oriented to person, place, and time.  Uncomfortable, tachypneic, talking in full sentences   HENT:  Head: Normocephalic.  Mouth/Throat: Oropharynx is clear and moist.  Eyes: Conjunctivae are normal. Pupils are equal, round, and reactive to light.  Neck: Normal range of motion. Neck supple.  Cardiovascular: Normal rate, regular rhythm and normal heart sounds.   Pulmonary/Chest:  Tachypneic, + crackles bilateral bases. No obvious wheezing. Talking in full sentences   Abdominal: Soft. Bowel sounds are normal. He exhibits no distension. There is no tenderness. There is no rebound.  Musculoskeletal: Normal range of motion.  1+ edema bilaterally   Neurological: He is alert  and oriented to person, place, and time. No cranial nerve deficit. Coordination normal.  Skin: Skin is warm and  dry.  Psychiatric: He has a normal mood and affect. His behavior is normal. Judgment and thought content normal.    ED Course  Procedures (including critical care time) Labs Review Labs Reviewed  CBC WITH DIFFERENTIAL - Abnormal; Notable for the following:    RBC 3.40 (*)    Hemoglobin 11.0 (*)    HCT 32.9 (*)    All other components within normal limits  COMPREHENSIVE METABOLIC PANEL - Abnormal; Notable for the following:    Glucose, Bld 111 (*)    BUN 36 (*)    Creatinine, Ser 2.20 (*)    Albumin 3.3 (*)    GFR calc non Af Amer 24 (*)    GFR calc Af Amer 28 (*)    All other components within normal limits  POCT I-STAT 3, BLOOD GAS (G3+) - Abnormal; Notable for the following:    pCO2 arterial 47.9 (*)    Bicarbonate 32.5 (*)    Acid-Base Excess 7.0 (*)    All other components within normal limits  POCT I-STAT TROPONIN I - Abnormal; Notable for the following:    Troponin i, poc 0.18 (*)    All other components within normal limits  PRO B NATRIURETIC PEPTIDE  CG4 I-STAT (LACTIC ACID)   Imaging Review Dg Chest Portable 1 View  06/18/2013   CLINICAL DATA:  80 year old male with respiratory distress. Initial encounter.  EXAM: PORTABLE CHEST - 1 VIEW  COMPARISON:  06/14/2013 and earlier.  FINDINGS: Portable AP upright view at 1256 hrs. Diffuse increased reticulonodular opacity. Stable cardiac size and mediastinal contours. Stable left chest cardiac pacemaker. No pneumothorax or definite effusion.  IMPRESSION: Diffuse increased reticulonodular pulmonary opacity. Top differential considerations include acute pulmonary interstitial edema and viral/atypical pneumonia.   Electronically Signed   By: Lars Pinks M.D.   On: 06/18/2013 13:18    EKG Interpretation   None       MDM  No diagnosis found. YOON BARCA is a 80 y.o. male here with SOB. Recently admitted for CHF and was diuresed. I think he likely has pulmonary edema. Will check BNP, labs, trop, cxr. Will likely admit.   2  PM I called cardiology for eval for CHF. Dr. Dina Rich will f/u consult.   3:30 PM Trop 0.18, similar to baseline. Will give asa and hold heparin for now. CXR showed pulm edema. Will give lasix. Cardiology consult pending.     Wandra Arthurs, MD 06/18/13 [phone]

## 2013-06-18 NOTE — Progress Notes (Signed)
RN asked to change patient's foley bag to the regular drainage bag for more comfort and adequate measurements. Patient asked that we do not do that and to the leave the one he has on now. Will continue to monitor

## 2013-06-18 NOTE — Telephone Encounter (Signed)
per Dr.Smith pt appt mad for 05/22/13 @9am  with a bmet

## 2013-06-18 NOTE — ED Notes (Signed)
Troponin results given to Dr. Yao 

## 2013-06-18 NOTE — ED Notes (Signed)
Family at bedside. 

## 2013-06-18 NOTE — Progress Notes (Signed)
Pt admitted to the unit with son and caregiver. Pt is stable, alert and oriented per baseline. Oriented to room, staff, and call bell. Educated to call for any assistance. Bed in lowest position, call bell within reach- will continue to monitor.

## 2013-06-18 NOTE — Discharge Instructions (Signed)
Please contact your urology office later today to schedule appointment in the next week.  Return to emergency department for worsening condition or new concerning symptoms.   Acute Urinary Retention, Male Acute urinary retention is the temporary inability to urinate. This is a common problem in older men. As men age their prostates become larger and block the flow of urine from the bladder. This is usually a problem that has come on gradually.  HOME CARE INSTRUCTIONS If you are sent home with a Foley catheter and a drainage system, you will need to discuss the best course of action with your health care provider. While the catheter is in, maintain a good intake of fluids. Keep the drainage bag emptied and lower than your catheter. This is so that contaminated urine will not flow back into your bladder, which could lead to a urinary tract infection. There are two main types of drainage bags. One is a large bag that usually is used at night. It has a good capacity that will allow you to sleep through the night without having to empty it. The second type is called a leg bag. It has a smaller capacity, so it needs to be emptied more frequently. However, the main advantage is that it can be attached by a leg strap and can go underneath your clothing, allowing you the freedom to move about or leave your home. Only take over-the-counter or prescription medicines for pain, discomfort, or fever as directed by your health care provider.  SEEK MEDICAL CARE IF:  You develop a low-grade fever.  You experience spasms or leakage of urine with the spasms. SEEK IMMEDIATE MEDICAL CARE IF:   You develop chills or fever.  Your catheter stops draining urine.  Your catheter falls out.  You start to develop increased bleeding that does not respond to rest and increased fluid intake. MAKE SURE YOU:  Understand these instructions.  Will watch your condition.  Will get help right away if you are not doing well  or get worse. Document Released: 08/06/2000 Document Revised: 12/31/2012 Document Reviewed: 10/09/2012 [facility] Patient Information 2014 [place], [place].

## 2013-06-18 NOTE — H&P (Signed)
[name] is an 80 y.o. male.    Primary Cardiologist: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 At his recent office visit with [name] he was found to have persistent atrial fibrillation since 05/21/2013. On 06/02/2013, amiodarone was added. Since that time [name] has been adjusting his medications to control rate, hypotension and exacerbation of diastolic heart failure. Most recently isosorbide, amlodipine, and losartan have been discontinued or decreased. Amiodarone was increased to 200 mg twice daily on 06/11/2013. On Sat evening at the [facility] where he was being honored, he developed shortness of breath after giving his speech. Upon arrival of EMS, he was hypoxic and briefly required BiPAP in the emergency room. He was diuresed and his SOB resolved. EP has been asked to see for recommendations regarding AAD options for management of AF.   He underwent DCCV by [name] 06/16/13, successfully to SR AV pacing.  He had issues with urinary retention during hospitalization and prior to discharge he continued with urinary retention requiring I&O caths.    He came in during the early AM with urinary retention and foley cath was placed with leg bag.  He is to follow up with Dr. Kellie Simmering.  He then went home and after his amiodarone he developed significant SOB.  He denies chest pain.  Symptoms did not improve and EMS called.  Pt was on non rebreather on arrival but symptoms improved after nebulizer.  He also rec'd 60 mg IV lasix and an ASA.   Currently without complaints.  His caregiver feels the amiodarone is causing these episodes.  His troponin is mildly elevated at 0.18.  Previously he has had mildly  elevated troponins.   Device history - He originally received a pacemaker about 20 years ago for symptomatic bradycardia. He underwent generator change by Dr Rollene Fare in 2004 and then again by Dr Ilda Foil in 2011. His current device is a STJ 2210 RF pacemaker.    Past Medical History  Diagnosis Date  . Diabetes mellitus   . Hyperlipidemia   . Coronary artery disease   . Spinal stenosis   . Pneumonia   . Pacemaker   . Anemia   . Carotid artery disease   . PVD (peripheral vascular disease)   . Hyponatremia     Chronic  . CVA (cerebral infarction)   . CKD (chronic kidney disease) stage 3, GFR 30-59 ml/min   . Long term (current) use of anticoagulants     No bleeding on Eliquis  . Dysrhythmia   . PSVT (paroxysmal supraventricular tachycardia)   . PAF (paroxysmal atrial fibrillation)     Asymptomatic. On Eliquis.  . AV block, 3rd degree     With DDD St. Jude PM, initially placed in 1993 by Dr. Nils Pyle. Device upgrade 10/2002 to DDD, by Dr. Rollene Fare complicated by bleeding.  . Essential hypertension, benign   . CHF (congestive heart failure)   . Chronic diastolic congestive heart failure     ECHO 05/20/12 LVEF estimated by 2D at 55-60%  . Arthritis     Past Surgical History  Procedure Laterality Date  . Lumbar laminectomy  1995  . Back surgery    . Total  knee arthroplasty Left   . Quadriceps tendon repair    . Cataract extraction    . Carotid endarterectomy    . Yag laser application Right 9/51/8841    Procedure: YAG LASER CAPSULOTOMY OF RIGHT EYE;  Surgeon: [name], MD;  Location: [facility];  Service: Ophthalmology;  Laterality: Right;  . Tonsillectomy    . Insert / replace / remove pacemaker      DDD St. Jude PM, initially placed in 1993 by Dr. Nils Pyle. Device upgrade 10/2002 to DDD, by Dr. Rollene Fare complicated by bleeding.  . Carotid endarterectomy Right 2006  . Transurethral resection of prostate      Family History  Problem Relation Age of Onset  .  Multiple myeloma Father   . Hypertension Mother    Social History:  reports that he has never smoked. He has never used smokeless tobacco. He reports that he does not drink alcohol or use illicit drugs.  Continues to work in his [employer].    Allergies:  Allergies  Allergen Reactions  . Aggrenox [Aspirin-Dipyridamole Er] Other (See Comments)    Dizziness  . Carvedilol Other (See Comments)    Dizziness  . Claritin [Loratadine] Other (See Comments)    Dizziness & drowsiness   . Mucinex [Guaifenesin Er] Other (See Comments)    Dizziness, drowsiness  . Plavix [Clopidogrel Bisulfate] Other (See Comments)    Dizziness  . Rapaflo [Silodosin] Other (See Comments)    Dizziness  . Statins Other (See Comments)    rhabdomyolisis  . Travatan Z [Travoprost] Other (See Comments)    Eye irritation    OUTPATIENT MEDICATIONS: Allopurinol 100 mg at HS Amiodarone 200 mg BID Eliquis 2.5 mg BID avodart 0.5 mg daily ferrous sulfate 325 mg every other day Insulin lispro 9-10 units TID before meals 9 units with BK and lunch and 9 units      Results for orders placed during the hospital encounter of 06/18/13 (from the past 48 hour(s))  CBC WITH DIFFERENTIAL     Status: Abnormal   Collection Time    06/18/13 12:44 PM      Result Value Range   WBC 7.4  4.0 - 10.5 K/uL   RBC 3.40 (*) 4.22 - 5.81 MIL/uL   Hemoglobin 11.0 (*) 13.0 - 17.0 g/dL   HCT 32.9 (*) 39.0 - 52.0 %   MCV 96.8  78.0 - 100.0 fL   MCH 32.4  26.0 - 34.0 pg   MCHC 33.4  30.0 - 36.0 g/dL   RDW 14.9  11.5 - 15.5 %   Platelets 213  150 - 400 K/uL   Neutrophils Relative % 68  43 - 77 %   Neutro Abs 5.0  1.7 - 7.7 K/uL   Lymphocytes Relative 20  12 - 46 %   Lymphs Abs 1.5  0.7 - 4.0 K/uL   Monocytes Relative 12  3 - 12 %   Monocytes Absolute 0.9  0.1 - 1.0 K/uL   Eosinophils Relative 1  0 - 5 %   Eosinophils Absolute 0.1  0.0 - 0.7 K/uL   Basophils Relative 0  0 - 1 %   Basophils Absolute 0.0  0.0 - 0.1 K/uL  COMPREHENSIVE  METABOLIC PANEL     Status: Abnormal   Collection Time    06/18/13 12:44 PM      Result Value Range   Sodium 141  137 - 147 mEq/L   Potassium 4.1  3.7 - 5.3 mEq/L   Chloride 98  96 - 112 mEq/L   CO2 28  19 - 32 mEq/L   Glucose, Bld 111 (*) 70 - 99 mg/dL   BUN 36 (*) 6 - 23 mg/dL   Creatinine, Ser 2.20 (*) 0.50 - 1.35 mg/dL   Calcium 9.0  8.4 - 10.5 mg/dL   Total Protein 7.2  6.0 - 8.3 g/dL   Albumin 3.3 (*) 3.5 - 5.2 g/dL   AST 33  0 - 37 U/L   ALT 27  0 - 53 U/L   Alkaline Phosphatase 57  39 - 117 U/L   Total Bilirubin 0.4  0.3 - 1.2 mg/dL   GFR calc non Af Amer 24 (*) >90 mL/min   GFR calc Af Amer 28 (*) >90 mL/min   Comment: (NOTE)     The eGFR has been calculated using the CKD EPI equation.     This calculation has not been validated in all clinical situations.     eGFR's persistently <90 mL/min signify possible Chronic Kidney     Disease.  PRO B NATRIURETIC PEPTIDE     Status: Abnormal   Collection Time    06/18/13 12:44 PM      Result Value Range   Pro B Natriuretic peptide (BNP) 3943.0 (*) 0 - 450 pg/mL  POCT I-STAT 3, BLOOD GAS (G3+)     Status: Abnormal   Collection Time    06/18/13  2:58 PM      Result Value Range   pH, Arterial 7.439  7.350 - 7.450   pCO2 arterial 47.9 (*) 35.0 - 45.0 mmHg   pO2, Arterial 85.0  80.0 - 100.0 mmHg   Bicarbonate 32.5 (*) 20.0 - 24.0 mEq/L   TCO2 34  0 - 100 mmol/L   O2 Saturation 97.0     Acid-Base Excess 7.0 (*) 0.0 - 2.0 mmol/L   Collection site RADIAL, [name]'S TEST ACCEPTABLE     Drawn by RT     Sample type ARTERIAL    POCT I-STAT TROPONIN I     Status: Abnormal   Collection Time    06/18/13  3:04 PM      Result Value Range   Troponin i, poc 0.18 (*) 0.00 - 0.08 ng/mL   Comment NOTIFIED PHYSICIAN     Comment 3            Comment: Due to the release kinetics of cTnI,     a negative result within the first hours     of the onset of symptoms does not rule out     myocardial infarction with certainty.     If myocardial  infarction is still suspected,     repeat the test at appropriate intervals.  CG4 I-STAT (LACTIC ACID)     Status: None   Collection Time    06/18/13  3:06 PM      Result Value Range   Lactic Acid, Venous 0.87  0.5 - 2.2 mmol/L   Dg Chest Portable 1 View  06/18/2013   CLINICAL DATA:  80 year old male with respiratory distress. Initial encounter.  EXAM: PORTABLE CHEST - 1 VIEW  COMPARISON:  06/14/2013 and earlier.  FINDINGS: Portable AP upright view at 1256 hrs. Diffuse increased reticulonodular opacity. Stable cardiac size and mediastinal contours. Stable left chest cardiac pacemaker. No pneumothorax or definite effusion.  IMPRESSION: Diffuse increased reticulonodular pulmonary opacity. Top differential considerations include acute pulmonary interstitial edema and viral/atypical pneumonia.   Electronically Signed   By: Lars Pinks M.D.   On:  06/18/2013 13:18    ROS: General:no colds or fevers, no weight changes Skin:no rashes or ulcers HEENT:no blurred vision, no congestion CV:see HPI PUL:see HPI GI:no diarrhea constipation or melena, no indigestion GU:no hematuria, no dysuria MS:no joint pain, no claudication Neuro:no syncope, no lightheadedness Endo:+ diabetes, no thyroid disease   Blood pressure 133/67, pulse 66, resp. rate 27, SpO2 100.00%. PE: General:Pleasant affect, NAD Skin:Warm and dry, brisk capillary refill HEENT:normocephalic, sclera clear, mucus membranes moist Neck:supple, mild JVD, no bruits  Heart:S1S2 RRR without murmur, gallup, rub or click Lungs:diminished, without rales, rhonchi, or wheezes ZOX:WRUE, non tender, + BS, do not palpate liver spleen or masses Ext:no lower ext edema, 2+ pedal pulses, 2+ radial pulses Neuro:alert and oriented, MAE, follows commands, + facial symmetry    Assessment/Plan Principal Problem:   Acute respiratory failure, possible related to amiodarone Active Problems:   Chronic diastolic CHF (congestive heart failure)   Diabetes  mellitus   CKD (chronic kidney disease), stage III   Paroxysmal atrial fibrillation, DCCV 06/16/13   Chronic anticoagulation   Pacemaker   Urinary retention, now with foley cath and leg bag.  PLAN:  Admit, stop amiodarone, PFTs, serial troponin.  Monitor.  [place] Nurse Practitioner Certified [place] Pager [phone] or after 5pm or weekends call [phone] 06/18/2013, 4:32 PM Patient was seen in the emergency room with Cecilie Kicks NP-C. I agree with her assessment and plan as amended.  Chest x-ray was personally reviewed.  He does show significant diffuse reticular nodular opacity which may be interstitial edema or may be a primary pulmonary process.  We will attempt to increase his diuresis while keeping a close eye on his renal function.  Some of his recent renal function deterioration may have been secondary to urinary retention which is now relieved with a indwelling Foley catheter.  The patient has had a cough since Christmas day which is nonproductive.  He is not bringing up any purulent sputum and he is not febrile.  His caregiver  is quite adamant that his dyspnea worsens acutely shortly after he takes his amiodarone each day.  He may be having an acute pulmonary reaction to the amiodarone to account for the symptoms.  We will stop his amiodarone.  He is presently in an AV paced rhythm.  He is on apixaban for anticoagulation for his paroxysmal atrial fibrillation.  We will cycle enzymes and we will plan to get pulmonary function studies on him.  He may also benefit from a pulmonary consultation during this admission.  His physical findings revealed that he is not orthopneic.  He does have bibasilar rales.  There is no significant pedal edema.

## 2013-06-18 NOTE — ED Notes (Signed)
Bladder scan 464 ml 

## 2013-06-18 NOTE — Progress Notes (Signed)
RT called to patient's bedside to assess for BiPAP. Patient was on NRB when I arrived, SAT 100%. Able to speak in complete sentences. Weaned down to 4 LNC. MD at bedside and does not feel as if patient needs BiPAP. RT will continue to monitor.

## 2013-06-18 NOTE — ED Notes (Signed)
According to EMS the patient was here for some cardiac rhythm got cardioverted and sent home yesterday morning.  He then returned last night for urinary retention and he had a urinary catheter placed and was sent home.  The patient called today because he was having SOB.  According to EMS he was on O2 from [employer] and he was satting 100%.  They placed him on a non-re breather and transported here.

## 2013-06-19 ENCOUNTER — Other Ambulatory Visit (HOSPITAL_COMMUNITY): Payer: Self-pay | Admitting: Cardiology

## 2013-06-19 ENCOUNTER — Inpatient Hospital Stay (HOSPITAL_COMMUNITY): Payer: Medicare Other

## 2013-06-19 DIAGNOSIS — I4891 Unspecified atrial fibrillation: Secondary | ICD-10-CM

## 2013-06-19 DIAGNOSIS — I5033 Acute on chronic diastolic (congestive) heart failure: Secondary | ICD-10-CM

## 2013-06-19 DIAGNOSIS — R0602 Shortness of breath: Secondary | ICD-10-CM

## 2013-06-19 LAB — PULMONARY FUNCTION TEST
FEF 25-75 POST: 1.88 L/s
FEF 25-75 Pre: 1.12 L/sec
FEF2575-%Change-Post: 67 %
FEF2575-%PRED-PRE: 90 %
FEF2575-%Pred-Post: 151 %
FEV1-%Change-Post: 18 %
FEV1-%PRED-PRE: 45 %
FEV1-%Pred-Post: 53 %
FEV1-Post: 1.09 L
FEV1-Pre: 0.92 L
FEV1FVC-%Change-Post: -8 %
FEV1FVC-%Pred-Pre: 126 %
FEV6-%CHANGE-POST: 29 %
FEV6-%PRED-POST: 47 %
FEV6-%PRED-PRE: 36 %
FEV6-PRE: 1.01 L
FEV6-Post: 1.3 L
FEV6FVC-%PRED-POST: 108 %
FEV6FVC-%Pred-Pre: 108 %
FVC-%CHANGE-POST: 29 %
FVC-%PRED-POST: 43 %
FVC-%Pred-Pre: 33 %
FVC-POST: 1.3 L
FVC-Pre: 1.01 L
POST FEV1/FVC RATIO: 83 %
POST FEV6/FVC RATIO: 100 %
Pre FEV1/FVC ratio: 91 %
Pre FEV6/FVC Ratio: 100 %
RV % pred: 109 %
RV: 3.01 L
TLC % pred: 82 %
TLC: 5.36 L

## 2013-06-19 LAB — HEPATIC FUNCTION PANEL
ALT: 24 U/L (ref 0–53)
AST: 30 U/L (ref 0–37)
Albumin: 3.3 g/dL — ABNORMAL LOW (ref 3.5–5.2)
Alkaline Phosphatase: 60 U/L (ref 39–117)
BILIRUBIN TOTAL: 0.5 mg/dL (ref 0.3–1.2)
Bilirubin, Direct: <0.2 mg/dL (ref 0.0–0.3)
TOTAL PROTEIN: 7.3 g/dL (ref 6.0–8.3)

## 2013-06-19 LAB — BASIC METABOLIC PANEL
BUN: 33 mg/dL — AB (ref 6–23)
CO2: 29 mEq/L (ref 19–32)
Calcium: 8.9 mg/dL (ref 8.4–10.5)
Chloride: 98 mEq/L (ref 96–112)
Creatinine, Ser: 2.03 mg/dL — ABNORMAL HIGH (ref 0.50–1.35)
GFR calc Af Amer: 31 mL/min — ABNORMAL LOW (ref >90)
GFR, EST NON AFRICAN AMERICAN: 27 mL/min — AB (ref >90)
Glucose, Bld: 124 mg/dL — ABNORMAL HIGH (ref 70–99)
POTASSIUM: 3.6 meq/L — AB (ref 3.7–5.3)
SODIUM: 142 meq/L (ref 137–147)

## 2013-06-19 LAB — GLUCOSE, CAPILLARY
Glucose-Capillary: 121 mg/dL — ABNORMAL HIGH (ref 70–99)
Glucose-Capillary: 197 mg/dL — ABNORMAL HIGH (ref 70–99)
Glucose-Capillary: 198 mg/dL — ABNORMAL HIGH (ref 70–99)
Glucose-Capillary: 228 mg/dL — ABNORMAL HIGH (ref 70–99)

## 2013-06-19 LAB — TROPONIN I: Troponin I: <0.3 ng/mL (ref <0.30)

## 2013-06-19 MED ORDER — ALBUTEROL SULFATE (2.5 MG/3ML) 0.083% IN NEBU
2.5000 mg | INHALATION_SOLUTION | Freq: Once | RESPIRATORY_TRACT | Status: AC
  Administered 2013-06-19: 2.5 mg via RESPIRATORY_TRACT

## 2013-06-19 MED ORDER — SILODOSIN 8 MG PO CAPS
8.0000 mg | ORAL_CAPSULE | Freq: Every day | ORAL | Status: DC
  Administered 2013-06-19 – 2013-06-21 (×3): 8 mg via ORAL
  Filled 2013-06-19 (×4): qty 1

## 2013-06-19 MED ORDER — FUROSEMIDE 10 MG/ML IJ SOLN
60.0000 mg | Freq: Two times a day (BID) | INTRAMUSCULAR | Status: AC
Start: 2013-06-19 — End: 2013-06-20
  Administered 2013-06-19 – 2013-06-20 (×2): 60 mg via INTRAVENOUS
  Filled 2013-06-19: qty 6

## 2013-06-19 NOTE — Progress Notes (Addendum)
Patient Name: [name] Date of Encounter: 06/19/2013    SUBJECTIVE: Recently discharged after admission for A/C DHF precipitated by persistent AFib. Had cardioversion, diuresis and was stable at discharge. Developed urinary retention following anesthesia on the evening of his cardioversion. Had I/O cath and was doing well the next day and discharged. He came back to ER early the next morning and was again c/o urinary retention. An indwelling catheter was placed and he was sent home. Readmitted later that day with severe dyspnea which improved with diuresis. His aid feels he gets sick after each dose of amiodarone.  TELEMETRY:  AV seq pacing: Filed Vitals:   06/18/13 2145 06/19/13 0137 06/19/13 0634 06/19/13 1016  BP: 136/74 127/65 137/78 130/66  Pulse: 64 68 67 67  Temp:  97.2 F (36.2 C) 97 F (36.1 C)   TempSrc:  Oral Oral   Resp:  20 20   Height:      Weight:   191 lb 12.8 oz (87 kg)   SpO2:  97% 98%     Intake/Output Summary (Last 24 hours) at 06/19/13 1151 Last data filed at 06/19/13 0946  Gross per 24 hour  Intake    120 ml  Output   1625 ml  Net  -1505 ml   I/O = - 1500 cc since admission  LABS: Basic Metabolic Panel:  Recent Labs  06/18/13 1244 06/18/13 2046 06/19/13 0300  NA 141  --  142  K 4.1  --  3.6*  CL 98  --  98  CO2 28  --  29  GLUCOSE 111*  --  124*  BUN 36*  --  33*  CREATININE 2.20*  --  2.03*  CALCIUM 9.0  --  8.9  MG  --  2.4  --    CBC:  Recent Labs  06/18/13 1244  WBC 7.4  NEUTROABS 5.0  HGB 11.0*  HCT 32.9*  MCV 96.8  PLT 213   Cardiac Enzymes:  Recent Labs  06/18/13 2216 06/19/13 0300 06/19/13 0910  TROPONINI <0.30 <0.30 <0.30   BNP (last 3 results)  Recent Labs  05/06/13 0348 06/13/13 2340 06/18/13 1244  PROBNP 5209.0* 4453.0* 3943.0*    Radiology/Studies:  CXR with reticulonodular pattern c/w CHF  Physical Exam: Blood pressure 130/66, pulse 67, temperature 97 F (36.1 C), temperature source  Oral, resp. rate 20, height 5\' 7"  (1.702 m), weight 191 lb 12.8 oz (87 kg), SpO2 98.00%. Weight change:    Chest is clear anteriorly Cardiac rhythm is regular. No edema.  ASSESSMENT:  1. Acute on chronic diastolic heart failure precipitated by stress of recurrent urinary retention. Dramatic improvement with diuresis. 2. Persistent atrial fibrillation has not recurred but at risk now that amio has been stopped. 3. Reticulonodular pattern on CXR, ? CHF vs. Amiodarone toxicity. Has had dramatic improvement with diuresis 4. Recurrent urinary retention, now with indwelling catheter.  Plan:  1. Aggressive diuresis as tolerated by renal funx 2. Hold amiodarone and see if CXR clears with diuresis 3. Monitor for recurrent AFIB 4. Inpatient staus until stable for 36 to 48 hours on oral regimen. 5. If CXR clears, sed rate is low and DLCO is okay, I would favor resuming maintenance amio. To prevent recurrent AF. 6. Will give at least 2 more doses of furosemide IV before resuming oral Demadex. Watch renal function closely 7. Discontinued Losartan while we diurese him to avoid renal injury 8. Leave foley in. 9. PA and Lat  CXR Sunday AM  Signed, Olen Pel W 06/19/2013, 11:51 AM

## 2013-06-19 NOTE — Progress Notes (Signed)
Utilization review completed. Ellie Bryand, RN, BSN. 

## 2013-06-20 ENCOUNTER — Inpatient Hospital Stay (HOSPITAL_COMMUNITY): Payer: Medicare Other

## 2013-06-20 DIAGNOSIS — N183 Chronic kidney disease, stage 3 unspecified: Secondary | ICD-10-CM

## 2013-06-20 DIAGNOSIS — I5031 Acute diastolic (congestive) heart failure: Secondary | ICD-10-CM

## 2013-06-20 DIAGNOSIS — E119 Type 2 diabetes mellitus without complications: Secondary | ICD-10-CM

## 2013-06-20 DIAGNOSIS — Z950 Presence of cardiac pacemaker: Secondary | ICD-10-CM

## 2013-06-20 DIAGNOSIS — R339 Retention of urine, unspecified: Secondary | ICD-10-CM

## 2013-06-20 LAB — SEDIMENTATION RATE: Sed Rate: 30 mm/hr — ABNORMAL HIGH (ref 0–16)

## 2013-06-20 LAB — GLUCOSE, CAPILLARY
Glucose-Capillary: 357 mg/dL — ABNORMAL HIGH (ref 70–99)
Glucose-Capillary: 272 mg/dL — ABNORMAL HIGH (ref 70–99)
Glucose-Capillary: 352 mg/dL — ABNORMAL HIGH (ref 70–99)

## 2013-06-20 LAB — BASIC METABOLIC PANEL
BUN: 38 mg/dL — AB (ref 6–23)
CO2: 32 mEq/L (ref 19–32)
CREATININE: 2.02 mg/dL — AB (ref 0.50–1.35)
Calcium: 8.7 mg/dL (ref 8.4–10.5)
Chloride: 97 mEq/L (ref 96–112)
GFR calc Af Amer: 31 mL/min — ABNORMAL LOW (ref >90)
GFR, EST NON AFRICAN AMERICAN: 27 mL/min — AB (ref >90)
Glucose, Bld: 253 mg/dL — ABNORMAL HIGH (ref 70–99)
Potassium: 4 mEq/L (ref 3.7–5.3)
Sodium: 140 mEq/L (ref 137–147)

## 2013-06-20 MED ORDER — TORSEMIDE 20 MG PO TABS
80.0000 mg | ORAL_TABLET | Freq: Two times a day (BID) | ORAL | Status: DC
  Administered 2013-06-20 – 2013-06-21 (×4): 80 mg via ORAL
  Filled 2013-06-20 (×7): qty 4

## 2013-06-20 MED ORDER — INSULIN ASPART 100 UNIT/ML ~~LOC~~ SOLN
3.0000 [IU] | Freq: Once | SUBCUTANEOUS | Status: AC
Start: 2013-06-20 — End: 2013-06-20
  Administered 2013-06-20: 3 [IU] via SUBCUTANEOUS

## 2013-06-20 MED ORDER — AMIODARONE HCL 100 MG PO TABS
100.0000 mg | ORAL_TABLET | Freq: Every day | ORAL | Status: DC
  Administered 2013-06-20 – 2013-06-21 (×2): 100 mg via ORAL
  Filled 2013-06-20 (×2): qty 1

## 2013-06-20 NOTE — Progress Notes (Signed)
Patient Name: [name] Date of Encounter: 06/20/2013  Principal Problem:   Acute respiratory failure, possible related to amiodarone Active Problems:   Chronic diastolic CHF (congestive heart failure)   Diabetes mellitus   CKD (chronic kidney disease), stage III   Paroxysmal atrial fibrillation, DCCV 06/16/13   Chronic anticoagulation   Pacemaker   Urinary retention, now with foley cath and leg bag.   Acute diastolic CHF (congestive heart failure)   Length of Stay: 2  SUBJECTIVE  Breathing much better, no orthopnea.  CURRENT MEDS . allopurinol  100 mg Oral QHS  . amiodarone  100 mg Oral Daily  . apixaban  2.5 mg Oral BID  . dutasteride  0.5 mg Oral q morning - 10a  . ferrous sulfate  325 mg Oral QODAY  . insulin glargine  9 Units Subcutaneous QHS  . latanoprost  1 drop Both Eyes QHS  . metoprolol tartrate  25 mg Oral Daily  . multivitamin with minerals  1 tablet Oral Daily  . polyvinyl alcohol  1 drop Both Eyes Daily  . potassium chloride SA  20 mEq Oral Daily  . silodosin  8 mg Oral QHS  . sodium chloride  3 mL Intravenous Q12H  . torsemide  80 mg Oral BID    OBJECTIVE   Intake/Output Summary (Last 24 hours) at 06/20/13 1038 Last data filed at 06/20/13 0902  Gross per 24 hour  Intake    600 ml  Output   1051 ml  Net   -451 ml   Filed Weights   06/19/13 0634 06/20/13 0444 06/20/13 0800  Weight: 87 kg (191 lb 12.8 oz) 89 kg (196 lb 3.4 oz) 86.6 kg (190 lb 14.7 oz)    PHYSICAL EXAM Filed Vitals:   06/19/13 1523 06/19/13 2200 06/20/13 0444 06/20/13 0800  BP: 114/55 113/54 110/44   Pulse: 62 72 63   Temp: 98.5 F (36.9 C) 98.1 F (36.7 C) 97.7 F (36.5 C)   TempSrc: Oral Oral Oral   Resp: '18 18 18   ' Height:      Weight:   89 kg (196 lb 3.4 oz) 86.6 kg (190 lb 14.7 oz)  SpO2: 98% 98% 98%    General: Alert, oriented x3, no distress Head: no evidence of trauma, PERRL, EOMI, no exophtalmos or lid lag, no myxedema, no xanthelasma; normal ears, nose and  oropharynx Neck: normal jugular venous pulsations and no hepatojugular reflux; brisk carotid pulses without delay and no carotid bruits Chest: clear to auscultation, no signs of consolidation by percussion or palpation, normal fremitus, symmetrical and full respiratory excursions Cardiovascular: normal position and quality of the apical impulse, regular rhythm, normal first and paradoxically split second heart sounds, no rubs or gallops, 1/6 systolic  murmur Abdomen: no tenderness or distention, no masses by palpation, no abnormal pulsatility or arterial bruits, normal bowel sounds, no hepatosplenomegaly Extremities: no clubbing, cyanosis or edema; 2+ radial, ulnar and brachial pulses bilaterally; 2+ right femoral, posterior tibial and dorsalis pedis pulses; 2+ left femoral, posterior tibial and dorsalis pedis pulses; no subclavian or femoral bruits Neurological: grossly nonfocal  LABS  CBC  Recent Labs  06/18/13 1244  WBC 7.4  NEUTROABS 5.0  HGB 11.0*  HCT 32.9*  MCV 96.8  PLT 110   Basic Metabolic Panel  Recent Labs  06/18/13 2046 06/19/13 0300 06/20/13 0400  NA  --  142 140  K  --  3.6* 4.0  CL  --  98 97  CO2  --  29 32  GLUCOSE  --  124* 253*  BUN  --  33* 38*  CREATININE  --  2.03* 2.02*  CALCIUM  --  8.9 8.7  MG 2.4  --   --    Liver Function Tests  Recent Labs  06/18/13 1244 06/19/13 0300  AST 33 30  ALT 27 24  ALKPHOS 57 60  BILITOT 0.4 0.5  PROT 7.2 7.3  ALBUMIN 3.3* 3.3*    Radiology Studies Imaging results have been reviewed and Dg Chest Portable 1 View  06/18/2013   CLINICAL DATA:  [name] with respiratory distress. Initial encounter.  EXAM: PORTABLE CHEST - 1 VIEW  COMPARISON:  06/14/2013 and earlier.  FINDINGS: Portable AP upright view at 1256 hrs. Diffuse increased reticulonodular opacity. Stable cardiac size and mediastinal contours. Stable left chest cardiac pacemaker. No pneumothorax or definite effusion.  IMPRESSION: Diffuse increased  reticulonodular pulmonary opacity. Top differential considerations include acute pulmonary interstitial edema and viral/atypical pneumonia.   Electronically Signed   By: Lars Pinks M.D.   On: 06/18/2013 13:18    TELE A paced V paced  ECG A paced V paced  ASSESSMENT AND PLAN Will switch to PO diuretics and monitor for at least 24 hours to make sure he does not retain fluid and decompensate again. Try very low dose amiodarone. Unfortunately, DLCO not reported with PFTs. ESR fairly low. Doubt amiodarone lung with his speed of improvement. Recheck CXR.   Sanda Klein, MD, [facility] [phone] office [phone] pager 06/20/2013 10:38 AM

## 2013-06-21 LAB — GLUCOSE, CAPILLARY
GLUCOSE-CAPILLARY: 250 mg/dL — AB (ref 70–99)
GLUCOSE-CAPILLARY: 188 mg/dL — AB (ref 70–99)
GLUCOSE-CAPILLARY: 336 mg/dL — AB (ref 70–99)
GLUCOSE-CAPILLARY: 316 mg/dL — AB (ref 70–99)
GLUCOSE-CAPILLARY: 329 mg/dL — AB (ref 70–99)
Glucose-Capillary: 62 mg/dL — ABNORMAL LOW (ref 70–99)
Glucose-Capillary: 158 mg/dL — ABNORMAL HIGH (ref 70–99)

## 2013-06-21 LAB — BASIC METABOLIC PANEL
BUN: 32 mg/dL — ABNORMAL HIGH (ref 6–23)
CHLORIDE: 96 meq/L (ref 96–112)
CO2: 28 mEq/L (ref 19–32)
Calcium: 8.8 mg/dL (ref 8.4–10.5)
Creatinine, Ser: 1.72 mg/dL — ABNORMAL HIGH (ref 0.50–1.35)
GFR calc Af Amer: 38 mL/min — ABNORMAL LOW (ref >90)
GFR calc non Af Amer: 33 mL/min — ABNORMAL LOW (ref >90)
GLUCOSE: 329 mg/dL — AB (ref 70–99)
POTASSIUM: 4.2 meq/L (ref 3.7–5.3)
SODIUM: 137 meq/L (ref 137–147)

## 2013-06-21 MED ORDER — INSULIN ASPART 100 UNIT/ML ~~LOC~~ SOLN: 0.0000 [IU] | Freq: Three times a day (TID) | SUBCUTANEOUS | Status: DC

## 2013-06-21 MED ORDER — AMIODARONE HCL 200 MG PO TABS
200.0000 mg | ORAL_TABLET | Freq: Every day | ORAL | Status: DC
  Administered 2013-06-22: 200 mg via ORAL
  Filled 2013-06-21: qty 1

## 2013-06-21 MED ORDER — INSULIN ASPART 100 UNIT/ML ~~LOC~~ SOLN
0.0000 [IU] | Freq: Three times a day (TID) | SUBCUTANEOUS | Status: DC
  Administered 2013-06-21 (×2): 11 [IU] via SUBCUTANEOUS
  Administered 2013-06-22: 8 [IU] via SUBCUTANEOUS
  Administered 2013-06-22: 5 [IU] via SUBCUTANEOUS

## 2013-06-21 MED ORDER — INSULIN ASPART 100 UNIT/ML ~~LOC~~ SOLN: 0.0000 [IU] | Freq: Every day | SUBCUTANEOUS | Status: DC

## 2013-06-21 NOTE — Progress Notes (Signed)
Lab result not on chart, contacted Gwen and instructed it was hemolized and to be recollect and someone will be up to do it.  Karie Kirks, Therapist, sports.

## 2013-06-21 NOTE — Progress Notes (Signed)
Patient Name: [name] Date of Encounter: 06/21/2013  Principal Problem:   Acute respiratory failure, possible related to amiodarone Active Problems:   Chronic diastolic CHF (congestive heart failure)   Diabetes mellitus   CKD (chronic kidney disease), stage III   Paroxysmal atrial fibrillation, DCCV 06/16/13   Chronic anticoagulation   Pacemaker   Urinary retention, now with foley cath and leg bag.   Acute diastolic CHF (congestive heart failure)   Length of Stay: 3  SUBJECTIVE Feels a little weak today No immediate side effects when he took amiodarone yesterday Exceptional diuresis despite switching to PO meds  Labs not back (have to be redrawn)  CURRENT MEDS . allopurinol  100 mg Oral QHS  . amiodarone  100 mg Oral Daily  . apixaban  2.5 mg Oral BID  . dutasteride  0.5 mg Oral q morning - 10a  . ferrous sulfate  325 mg Oral QODAY  . insulin glargine  9 Units Subcutaneous QHS  . latanoprost  1 drop Both Eyes QHS  . metoprolol tartrate  25 mg Oral Daily  . multivitamin with minerals  1 tablet Oral Daily  . polyvinyl alcohol  1 drop Both Eyes Daily  . potassium chloride SA  20 mEq Oral Daily  . silodosin  8 mg Oral QHS  . sodium chloride  3 mL Intravenous Q12H  . torsemide  80 mg Oral BID    OBJECTIVE   Intake/Output Summary (Last 24 hours) at 06/21/13 0922 Last data filed at 06/21/13 0843  Gross per 24 hour  Intake    660 ml  Output   2670 ml  Net  -2010 ml   Filed Weights   06/20/13 0444 06/20/13 0800 06/21/13 0605  Weight: 89 kg (196 lb 3.4 oz) 86.6 kg (190 lb 14.7 oz) 84.913 kg (187 lb 3.2 oz)    PHYSICAL EXAM Filed Vitals:   06/20/13 0800 06/20/13 1402 06/20/13 2056 06/21/13 0605  BP:  132/58 117/49 123/47  Pulse:  64 62 58  Temp:  97.9 F (36.6 C) 97.7 F (36.5 C) 97.8 F (36.6 C)  TempSrc:  Oral Oral Oral  Resp:  20 18 18   Height:      Weight: 86.6 kg (190 lb 14.7 oz)   84.913 kg (187 lb 3.2 oz)  SpO2:  100% 100% 99%  General: Alert,  oriented x3, no distress  Head: no evidence of trauma, PERRL, EOMI, no exophtalmos or lid lag, no myxedema, no xanthelasma; normal ears, nose and oropharynx  Neck: normal jugular venous pulsations and no hepatojugular reflux; brisk carotid pulses without delay and no carotid bruits  Chest: clear to auscultation, no signs of consolidation by percussion or palpation, normal fremitus, symmetrical and full respiratory excursions  Cardiovascular: normal position and quality of the apical impulse, regular rhythm, normal first and paradoxically split second heart sounds, no rubs or gallops, 1/6 systolic murmur  Abdomen: no tenderness or distention, no masses by palpation, no abnormal pulsatility or arterial bruits, normal bowel sounds, no hepatosplenomegaly  Extremities: no clubbing, cyanosis or edema; 2+ radial, ulnar and brachial pulses bilaterally; 2+ right femoral, posterior tibial and dorsalis pedis pulses; 2+ left femoral, posterior tibial and dorsalis pedis pulses; no subclavian or femoral bruits  Neurological: grossly nonfocal   LABS  CBC  Recent Labs  06/18/13 1244  WBC 7.4  NEUTROABS 5.0  HGB 11.0*  HCT 32.9*  MCV 96.8  PLT 016   Basic Metabolic Panel  Recent Labs  06/18/13 2046 06/19/13 0300  06/20/13 0400  NA  --  142 140  K  --  3.6* 4.0  CL  --  98 97  CO2  --  29 32  GLUCOSE  --  124* 253*  BUN  --  33* 38*  CREATININE  --  2.03* 2.02*  CALCIUM  --  8.9 8.7  MG 2.4  --   --    Liver Function Tests  Recent Labs  06/18/13 1244 06/19/13 0300  AST 33 30  ALT 27 24  ALKPHOS 57 60  BILITOT 0.4 0.5  PROT 7.2 7.3  ALBUMIN 3.3* 3.3*   No results found for this basename: LIPASE, AMYLASE,  in the last 72 hours Cardiac Enzymes  Recent Labs  06/18/13 2216 06/19/13 0300 06/19/13 0910  TROPONINI <0.30 <0.30 <0.30    Radiology Studies Imaging results have been reviewed and Dg Chest 2 View  06/20/2013   CLINICAL DATA:  Chest congestion and shortness of breath   EXAM: CHEST  2 VIEW  COMPARISON:  06/18/2013  FINDINGS: Cardiac shadow is stable. A pacing device is again seen. The previously seen reticular nodular infiltrative pattern has nearly completely resolved. Mild residual density is noted in the bases bilaterally no other focal abnormality is noted.  IMPRESSION: Significant improved aeration bilaterally. Small bibasilar changes remain.   Electronically Signed   By: Inez Catalina M.D.   On: 06/20/2013 14:46    TELE Ap Vp   ASSESSMENT AND PLAN Appears euvolemic. Renal function stable, at his baseline (labs from yesterday) Rapid and substantial improvement in CXR findings makes CHF the likely cause of the abnormality. Doubt amiodarone lung. (Unfortunately DLCO not reported with PFTs, and may have been misleading anyway if in CHF at the time) Continue Amiodarone 100 mg daily. May need to reduce the diuretic dose - waiting for labs today.   Sanda Klein, MD, [facility] [phone] office [phone] pager 06/21/2013 9:22 AM

## 2013-06-22 ENCOUNTER — Ambulatory Visit: Payer: Medicare Other | Admitting: Interventional Cardiology

## 2013-06-22 ENCOUNTER — Telehealth: Payer: Self-pay

## 2013-06-22 ENCOUNTER — Encounter (HOSPITAL_COMMUNITY): Payer: Self-pay | Admitting: Interventional Cardiology

## 2013-06-22 DIAGNOSIS — I5032 Chronic diastolic (congestive) heart failure: Secondary | ICD-10-CM

## 2013-06-22 DIAGNOSIS — I10 Essential (primary) hypertension: Secondary | ICD-10-CM

## 2013-06-22 LAB — BASIC METABOLIC PANEL
BUN: 30 mg/dL — AB (ref 6–23)
CALCIUM: 9.2 mg/dL (ref 8.4–10.5)
CHLORIDE: 98 meq/L (ref 96–112)
CO2: 30 mEq/L (ref 19–32)
CREATININE: 1.81 mg/dL — AB (ref 0.50–1.35)
GFR calc non Af Amer: 31 mL/min — ABNORMAL LOW (ref >90)
GFR, EST AFRICAN AMERICAN: 36 mL/min — AB (ref >90)
Glucose, Bld: 249 mg/dL — ABNORMAL HIGH (ref 70–99)
Potassium: 4 mEq/L (ref 3.7–5.3)
Sodium: 139 mEq/L (ref 137–147)

## 2013-06-22 LAB — GLUCOSE, CAPILLARY
Glucose-Capillary: 234 mg/dL — ABNORMAL HIGH (ref 70–99)
Glucose-Capillary: 265 mg/dL — ABNORMAL HIGH (ref 70–99)

## 2013-06-22 MED ORDER — AMIODARONE HCL 200 MG PO TABS: 200.0000 mg | ORAL_TABLET | Freq: Every day | ORAL | Status: DC

## 2013-06-22 MED ORDER — TORSEMIDE 20 MG PO TABS
60.0000 mg | ORAL_TABLET | Freq: Two times a day (BID) | ORAL | Status: DC
  Administered 2013-06-22: 60 mg via ORAL
  Filled 2013-06-22 (×3): qty 3

## 2013-06-22 NOTE — Progress Notes (Signed)
Pt declined waiting for lunch.  Instructed to give him insulin and will eat as soon as he get home.  Offered a pk of graham crackers and peanut butter to eat on way home.  Pt accepted.  Care take made aware and instructed pt will eat as soon as he get home.  All d/c instructions explained and given.  Pt verbalized understanding.   D/c to awaiting transport by NT via w/c.

## 2013-06-22 NOTE — Progress Notes (Addendum)
UR completed Nazair Fortenberry K. Quintasha Gren, RN, BSN, [place], CCM  06/22/2013 5:16 PM

## 2013-06-22 NOTE — Progress Notes (Signed)
Subjective: Feeling much better. Denies SOB, orthopnea, PND, chest pain and palpitations.   Objective: Vital signs in last 24 hours: Temp:  [97.5 F (36.4 C)-98.1 F (36.7 C)] 98.1 F (36.7 C) (02/09 0600) Pulse Rate:  [61-69] 61 (02/09 0600) Resp:  [17-20] 17 (02/09 0600) BP: (123-136)/(53-70) 125/53 mmHg (02/09 0600) SpO2:  [98 %-100 %] 99 % (02/09 0600) Weight:  [190 lb 14.7 oz (86.6 kg)] 190 lb 14.7 oz (86.6 kg) (02/09 0600) Last BM Date: 06/21/13  Intake/Output from previous day: 02/08 0701 - 02/09 0700 In: 600 [P.O.:600] Out: 1711 [Urine:1710; Stool:1] Intake/Output this shift: Total I/O In: 240 [P.O.:240] Out: 500 [Urine:500]  Medications Current Facility-Administered Medications  Medication Dose Route Frequency Provider Last Rate Last Dose  . 0.9 %  sodium chloride infusion  250 mL Intravenous PRN Cecilie Kicks, NP      . acetaminophen (TYLENOL) tablet 650 mg  650 mg Oral Q4H PRN Cecilie Kicks, NP      . allopurinol (ZYLOPRIM) tablet 100 mg  100 mg Oral QHS Cecilie Kicks, NP   100 mg at 06/21/13 2213  . amiodarone (PACERONE) tablet 200 mg  200 mg Oral Daily Belva Crome III, MD      . apixaban Arne [facility]) tablet 2.5 mg  2.5 mg Oral BID Cecilie Kicks, NP   2.5 mg at 06/21/13 2213  . dutasteride (AVODART) capsule 0.5 mg  0.5 mg Oral q morning - 10a Cecilie Kicks, NP   0.5 mg at 06/21/13 5409  . ferrous sulfate tablet 325 mg  325 mg Oral QODAY Cecilie Kicks, NP   325 mg at 06/21/13 8119  . insulin aspart (novoLOG) injection 0-15 Units  0-15 Units Subcutaneous TID WC Erlene Quan, PA-C   11 Units at 06/21/13 1655  . insulin aspart (novoLOG) injection 0-5 Units  0-5 Units Subcutaneous QHS Luke K Kilroy, PA-C      . insulin glargine (LANTUS) injection 9 Units  9 Units Subcutaneous QHS Cecilie Kicks, NP   9 Units at 06/21/13 2214  . latanoprost (XALATAN) 0.005 % ophthalmic solution 1 drop  1 drop Both Eyes QHS Cecilie Kicks, NP   1 drop at 06/20/13 2203  . metoprolol tartrate  (LOPRESSOR) tablet 25 mg  25 mg Oral Daily Cecilie Kicks, NP   25 mg at 06/21/13 0942  . multivitamin with minerals tablet 1 tablet  1 tablet Oral Daily Cecilie Kicks, NP   1 tablet at 06/21/13 [phone]  . nitroGLYCERIN (NITROSTAT) SL tablet 0.4 mg  0.4 mg Sublingual Q5 min PRN Wandra Arthurs, MD      . ondansetron [facility]) injection 4 mg  4 mg Intravenous Q6H PRN Cecilie Kicks, NP      . polyethylene glycol (MIRALAX / GLYCOLAX) packet 17 g  17 g Oral Daily PRN Cecilie Kicks, NP   17 g at 06/21/13 1109  . polyvinyl alcohol (LIQUIFILM TEARS) 1.4 % ophthalmic solution 1 drop  1 drop Both Eyes Daily Jaquita Folds, RPH      . potassium chloride SA (K-DUR,KLOR-CON) CR tablet 20 mEq  20 mEq Oral Daily Cecilie Kicks, NP   20 mEq at 06/21/13 0942  . silodosin (RAPAFLO) capsule 8 mg  8 mg Oral QHS Darlin Coco, MD   8 mg at 06/21/13 2213  . sodium chloride 0.9 % injection 3 mL  3 mL Intravenous Q12H Cecilie Kicks, NP   3 mL at 06/21/13 2213  . sodium chloride 0.9 % injection 3 mL  3  mL Intravenous PRN Cecilie Kicks, NP      . torsemide [facility]) tablet 80 mg  80 mg Oral BID Sanda Klein, MD   80 mg at 06/21/13 1724    PE: General appearance: alert, cooperative and no distress Neck: no JVD Lungs: clear to auscultation bilaterally Heart: regular rate and rhythm Extremities: no LEE Pulses: 2+ and symmetric Skin: warm and dry Neurologic: Grossly normal  Lab Results:  No results found for this basename: WBC, HGB, HCT, PLT,  in the last 72 hours BMET  Recent Labs  06/20/13 0400 06/21/13 1035  NA 140 137  K 4.0 4.2  CL 97 96  CO2 32 28  GLUCOSE 253* 329*  BUN 38* 32*  CREATININE 2.02* 1.72*  CALCIUM 8.7 8.8   BNP (last 3 results)  Recent Labs  05/06/13 0348 06/13/13 2340 06/18/13 1244  PROBNP 5209.0* 4453.0* 3943.0*     Assessment/Plan  Principal Problem:   Acute respiratory failure, possible related to amiodarone Active Problems:   Chronic diastolic CHF (congestive heart failure)    Diabetes mellitus   CKD (chronic kidney disease), stage III   Paroxysmal atrial fibrillation, DCCV 06/16/13   Chronic anticoagulation   Pacemaker   Urinary retention, now with foley cath and leg bag.   Acute diastolic CHF (congestive heart failure)  Plan: Pt reports significant improvement in symptoms. Denies SOB. When I entered his room, he was sleeping in supine position and appeared comfortable. O2 sats 99% on RA. No JVD. Lungs fields clear bilaterally. No LEE. He diuresed an additional 1.1L in past 24 hrs and 5.1 L total since admission. On PO torsemide 80 mg BID. Scr continues to improved, down to 1.72 from 2.02. BP stable. Electrolytes are WNL. Paced rhythm on telemetry. HR in the 60s. Continue low dose amiodarone for chronic atrial fibrillation. Resume Xarelto for anticoagulation. Can likely discharge home today. MD to follow.     LOS: 4 days    Brittainy M. Rosita Fire, PA-C 06/22/2013 6:18 AM

## 2013-06-22 NOTE — Care Management Note (Addendum)
  Page 1 of 1   06/22/2013     5:20:08 PM   CARE MANAGEMENT NOTE 06/22/2013  Patient:  [name], [name]   Account Number:  [id number]  Date Initiated:  06/22/2013  Documentation initiated by:  Kaitlynd Phillips  Subjective/Objective Assessment:   admitted with resp distress following 911 call / CHF     Action/Plan:   Follow for dispostion needs.   Anticipated DC Date:  06/22/2013   Anticipated DC Plan:  HOME/SELF CARE         Choice offered to / List presented to:             Status of service:  Completed, signed off Medicare Important Message given?   (If response is "NO", the following Medicare IM given date fields will be blank) Date Medicare IM given:   Date Additional Medicare IM given:    Discharge Disposition:  HOME/SELF CARE  Per UR Regulation:  Reviewed for med. necessity/level of care/duration of stay  If discussed at [place] of Stay Meetings, dates discussed:    Comments:  06/22/2013 Dispositon:  Home with caregivers. ADD:  today Mariann Laster RN, BSN, MSHL, CCM 06/22/2013

## 2013-06-22 NOTE — Progress Notes (Signed)
The patient stated that he felt a little "funny" at 2030 and requested that his blood sugar be checked.  VS were taken and his CBG was found to be 62.  He was given 2 cups of orange juice.  His blood sugar upon recheck was 158.

## 2013-06-22 NOTE — Telephone Encounter (Signed)
lmom for pt to call the office.pt needs an appt with Dr.Smith for 06/25/13 with bmet.

## 2013-06-22 NOTE — Discharge Summary (Signed)
Physician Discharge Summary  Patient ID: [name] MRN: [id number] DOB/AGE: Jan 16, 1919 80 y.o.  Admit date: 06/18/2013 Discharge date: 06/22/2013  Admission Diagnoses: Acute Respiratory Failure   Discharge Diagnoses:  Principal Problem:   Acute respiratory failure, possible related to amiodarone Active Problems:   Chronic diastolic CHF (congestive heart failure)   Diabetes mellitus   CKD (chronic kidney disease), stage III   Paroxysmal atrial fibrillation, DCCV 06/16/13   Chronic anticoagulation   Pacemaker   Urinary retention, now with foley cath and leg bag.   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 He is on chronic anticoagulation with Eliquis.  He [facility] to [facility] on [facility]18[facility]13 with a complaint of SOB. He was recently discharged, one day prior, on 06/17/13 by Internal Medicine, after being hospitalized for CHF, felt to be precipitated by Afib w/ RVR. During his initial hospitalization, he was diuresed and also underwent a DCCV by [name]. He was successfully converted back to NSR. His condition improved and he was discharged home. He was instructed to continue his Amiodarone at a dose of 200 mg BID. He was also discharged home on Demadex, 60 mg BID. His discharge weight was 89 kg (dry weight is 88-89 kg).   When he presented back on 2/5 with SOB, a CXR demonstrated significant diffuse reticular nodular opacity, which was felt to be either interstitial edema or a possible primary pulmonary process. His caretaker also seemed to believe that his dyspnea was related to amiodarone, as his symptoms started to develop from the time it was first initiated. It was decided to increase his diuresis and temporarily hold his amiodarone.  He had  significant improvement in symptoms. He had good diuresis though he had to have a foley catheter placed for urinary retention. He underwent PFTs, but unfortunately DLCO was not reported. ESR was fairly low.  He underwent a repeat CXR that demonstrated significant improvement. Given the rapid and substantial improvement in his CXR findings, it was felt that Amiodarone toxicity was less likely and that his primary cause for his dyspnea was likely CHF. It was decided to resume his Amiodarone at 200 mg daily. He had no further issues. His dyspnea resolved. His O2 sats were 99% on RA. He was last seen and examined by [name], who determined he was stable for discharge home.    Consults: None  Significant Diagnostic Studies:  CXR 06/18/13 IMPRESSION: Diffuse increased reticulonodular pulmonary opacity. Top differential considerations include acute pulmonary interstitial edema and viral/atypical pneumonia.   F/U CXR 06/20/13 FINDINGS: Cardiac shadow is stable. A pacing device is again seen. The previously seen reticular nodular infiltrative pattern has nearly completely resolved. Mild residual density is noted in the bases bilaterally no other focal abnormality is noted.  IMPRESSION: Significant improved aeration bilaterally. Small bibasilar changes Remain.   Pulmonary Functions Testing Results:  TLC  Date Value Range Status  06/19/2013 5.36   Preliminary  See results section for list of complete values   Treatments: See Hospital Course  Discharge Exam: Blood pressure 125/53, pulse 61, temperature 98.1 F (36.7 C), temperature source Oral, resp. rate 17, height '5\' 7"'  (1.702 m), weight 190 lb 14.7 oz (86.6 kg), SpO2 99.00%.  Disposition: 01-Home or Self Care      Discharge Orders  Future Appointments Provider Department Dept Phone   06/30/2013 10:30 AM Burtis Junes, NP [place] Office [phone]   07/17/2013 9:00 AM [name], MD [facility] [phone]   Future Orders Complete By Expires   Diet - low sodium heart healthy  As directed    Increase activity slowly  As directed        Medication List    STOP taking these medications       losartan 100 MG tablet  Commonly known as:  COZAAR      TAKE these medications       allopurinol 100 MG tablet  Commonly known as:  ZYLOPRIM  Take 100 mg by mouth at bedtime.     amiodarone 200 MG tablet  Commonly known as:  PACERONE  Take 1 tablet (200 mg total) by mouth daily.     apixaban 2.5 MG Tabs tablet  Commonly known as:  ELIQUIS  Take 1 tablet (2.5 mg total) by mouth 2 (two) times daily.     dutasteride 0.5 MG capsule  Commonly known as:  AVODART  Take 0.5 mg by mouth every morning.     ferrous sulfate 325 (65 FE) MG tablet  Take 325 mg by mouth every other day.     insulin glargine 100 UNIT/ML injection  Commonly known as:  LANTUS  Inject 12 Units into the skin at bedtime.     insulin lispro 100 UNIT/ML injection  Commonly known as:  HUMALOG  Inject 9-10 Units into the skin 3 (three) times daily before meals. 9 units with breakfast and lunch & 9 units after supper     metoprolol tartrate 25 MG tablet  Commonly known as:  LOPRESSOR  Take 25 mg by mouth daily.     multivitamin with minerals Tabs tablet  Take 1 tablet by mouth daily. Diabetic pak by Petra Kuba from [employer]     polyethylene glycol packet  Commonly known as:  MIRALAX / GLYCOLAX  Take 17 g by mouth daily as needed. For constipation     potassium chloride SA 20 MEQ tablet  Commonly known as:  K-DUR,KLOR-CON  Take 20 mEq by mouth daily.     REFRESH OP  Place 1 drop into both eyes 2 (two) times daily. Refresh Gel Opth     silodosin 8 MG Caps capsule  Commonly known as:  RAPAFLO  Take 8 mg by mouth daily with breakfast.     SYSTANE 0.4-0.3 % Soln  Generic drug:  Polyethyl Glycol-Propyl Glycol  Apply 1 drop to eye daily. Into affected eyes     torsemide 20 MG tablet  Commonly known  as:  DEMADEX  Take 60 mg by mouth 2 (two) times daily. Takes 3 in the am 3 in the pm     ZIOPTAN 0.0015 % Soln  Generic drug:  Tafluprost  Place 1 drop into both eyes every other day. Use at night       TIME SPENT ON DISCHARGE, INCLUDING PHYSICIAN TIME: >30 MINUTES  Signed: SIMMONS, BRITTAINY 06/22/2013, 9:07 AM

## 2013-06-23 NOTE — Discharge Summary (Signed)
We planned the discharge and medication adjustment. I will see him in the office on 06/25/13.

## 2013-06-23 NOTE — Telephone Encounter (Signed)
Follow Up  Pt returned the call//SR

## 2013-06-23 NOTE — Telephone Encounter (Signed)
returned call to pt caregiver [name].pt has an appt to see Dr.Smith on 06/25/13 @ 10am with a bmet. [name] verbalized understanding.

## 2013-06-25 ENCOUNTER — Encounter: Payer: Self-pay | Admitting: Interventional Cardiology

## 2013-06-25 ENCOUNTER — Ambulatory Visit (INDEPENDENT_AMBULATORY_CARE_PROVIDER_SITE_OTHER): Payer: Medicare Other | Admitting: Interventional Cardiology

## 2013-06-25 VITALS — BP 142/64 | HR 58 | Ht 67.0 in | Wt 188.0 lb

## 2013-06-25 DIAGNOSIS — Z950 Presence of cardiac pacemaker: Secondary | ICD-10-CM

## 2013-06-25 DIAGNOSIS — E119 Type 2 diabetes mellitus without complications: Secondary | ICD-10-CM

## 2013-06-25 DIAGNOSIS — I509 Heart failure, unspecified: Secondary | ICD-10-CM

## 2013-06-25 DIAGNOSIS — Z7901 Long term (current) use of anticoagulants: Secondary | ICD-10-CM

## 2013-06-25 DIAGNOSIS — I4891 Unspecified atrial fibrillation: Secondary | ICD-10-CM

## 2013-06-25 DIAGNOSIS — I5032 Chronic diastolic (congestive) heart failure: Secondary | ICD-10-CM

## 2013-06-25 DIAGNOSIS — Z79899 Other long term (current) drug therapy: Secondary | ICD-10-CM

## 2013-06-25 DIAGNOSIS — I10 Essential (primary) hypertension: Secondary | ICD-10-CM

## 2013-06-25 DIAGNOSIS — I480 Paroxysmal atrial fibrillation: Secondary | ICD-10-CM

## 2013-06-25 LAB — BASIC METABOLIC PANEL
BUN: 29 mg/dL — AB (ref 6–23)
CALCIUM: 9.5 mg/dL (ref 8.4–10.5)
CO2: 26 meq/L (ref 19–32)
CREATININE: 2.1 mg/dL — AB (ref 0.4–1.5)
Chloride: 99 mEq/L (ref 96–112)
GFR: 37.68 mL/min — ABNORMAL LOW (ref >60.00)
GLUCOSE: 235 mg/dL — AB (ref 70–99)
Potassium: 4.7 mEq/L (ref 3.5–5.1)
Sodium: 138 mEq/L (ref 135–145)

## 2013-06-25 NOTE — Progress Notes (Signed)
Patient ID: [name], male   DOB: 02-12-1919, 80 y.o.   MRN: [id number]    [address], [address] [place], [place]  [address] Phone: [phone] Fax:  [phone]  Date:  06/25/2013   ID:  [name], DOB 1918-10-30, MRN [id number]  PCP:  [name], MD   ASSESSMENT:  1. Diastolic heart failure, stable 2. Paroxysmal atrial fibrillation, now sinus rhythm/paced rhythm status post cardioversion 3. Amiodarone therapy, tolerated currently 4. Acute on chronic kidney injury, current status unknown 5. Anticoagulation without bleeding  PLAN:  1. Basic metabolic panel today 2. Office visit in one week 3. Call if event 3 pound weight gain from day to day   SUBJECTIVE: [name] is a 80 y.o. male who has had a recent hospitalizations for heart failure. Initial hospitalization was acute on chronic diastolic heart failure precipitated by a several week bile of atrial fibrillation. He had cardioversion, had additional diuresis after cardioversion. He went home from the hospital. Within 36 hours he bounced back with acute on chronic diastolic heart failure. More aggressive diuresis was then undertaken over a 3 to four-day timeframe. Since discharge from the hospital his weight is remaining stable and today it is 188 pounds. He denies orthopnea, PND, and edema. No chest pain. No palpitations. Episodes of dizziness have resolved since being back in normal sinus rhythm to   Wt Readings from Last 3 Encounters:  06/25/13 188 lb (85.276 kg)  06/22/13 190 lb 14.7 oz (86.6 kg)  06/18/13 191 lb (86.637 kg)     Past Medical History  Diagnosis Date  . Diabetes mellitus   . Hyperlipidemia   . Coronary artery disease   . Spinal stenosis   . Pneumonia   . Pacemaker   . Anemia   . Carotid artery disease   . PVD (peripheral vascular disease)   . Hyponatremia     Chronic  . CVA (cerebral infarction)   . CKD (chronic kidney disease) stage 3, GFR 30-59 ml/min   . Long term  (current) use of anticoagulants     No bleeding on Eliquis  . Dysrhythmia   . PSVT (paroxysmal supraventricular tachycardia)   . PAF (paroxysmal atrial fibrillation)     Asymptomatic. On Eliquis.  . AV block, 3rd degree     With DDD [employer] Jude PM, initially placed in 1993 by Dr. Nils Pyle. Device upgrade 10/2002 to DDD, by Dr. Rollene Fare complicated by bleeding.  . Essential hypertension, benign   . CHF (congestive heart failure)   . Chronic diastolic congestive heart failure     ECHO 05/20/12 LVEF estimated by 2D at 55-60%  . Arthritis     Current Outpatient Prescriptions  Medication Sig Dispense Refill  . allopurinol (ZYLOPRIM) 100 MG tablet Take 100 mg by mouth at bedtime.      [name] amiodarone (PACERONE) 200 MG tablet Take 1 tablet (200 mg total) by mouth daily.      [name] apixaban (ELIQUIS) 2.5 MG TABS tablet Take 1 tablet (2.5 mg total) by mouth 2 (two) times daily.  60 tablet  1  . dutasteride (AVODART) 0.5 MG capsule Take 0.5 mg by mouth every morning.       . ferrous sulfate 325 (65 FE) MG tablet Take 325 mg by mouth every other day.       . insulin glargine (LANTUS) 100 UNIT/ML injection Inject 12 Units into the skin at bedtime.       . insulin lispro (HUMALOG) 100 UNIT/ML  injection Inject 9-10 Units into the skin 3 (three) times daily before meals. 9 units with breakfast and lunch & 9 units after supper      . metoprolol tartrate (LOPRESSOR) 25 MG tablet Take 25 mg by mouth daily.      . Multiple Vitamin (MULTIVITAMIN WITH MINERALS) TABS Take 1 tablet by mouth daily. Diabetic pak by Petra Kuba from [employer]      . Polyethyl Glycol-Propyl Glycol (SYSTANE) 0.4-0.3 % SOLN Apply 1 drop to eye daily. Into affected eyes      . polyethylene glycol (MIRALAX / GLYCOLAX) packet Take 17 g by mouth daily as needed. For constipation      . Polyvinyl Alcohol-Povidone (REFRESH OP) Place 1 drop into both eyes 2 (two) times daily. Refresh Gel Opth      . potassium chloride SA (K-DUR,KLOR-CON) 20 MEQ tablet Take 20  mEq by mouth daily.      . silodosin (RAPAFLO) 8 MG CAPS capsule Take 8 mg by mouth daily with breakfast.      . Tafluprost (ZIOPTAN) 0.0015 % SOLN Place 1 drop into both eyes every other day. Use at night      . torsemide (DEMADEX) 20 MG tablet Take 60 mg by mouth 4 (four) times daily. Takes 4 tabs twice a day       No current facility-administered medications for this visit.    Allergies:    Allergies  Allergen Reactions  . Aggrenox [Aspirin-Dipyridamole Er] Other (See Comments)    Dizziness  . Carvedilol Other (See Comments)    Dizziness  . Claritin [Loratadine] Other (See Comments)    Dizziness & drowsiness   . Mucinex [Guaifenesin Er] Other (See Comments)    Dizziness, drowsiness  . Plavix [Clopidogrel Bisulfate] Other (See Comments)    Dizziness  . Rapaflo [Silodosin] Other (See Comments)    Dizziness  . Statins Other (See Comments)    rhabdomyolisis  . Travatan Z [Travoprost] Other (See Comments)    Eye irritation    Social History:  The patient  reports that he has never smoked. He has never used smokeless tobacco. He reports that he does not drink alcohol or use illicit drugs.   ROS:  Please see the history of present illness.   None   All other systems reviewed and negative.   OBJECTIVE: VS:  BP 142/64  Pulse 58  Ht 5\' 7"  (1.702 m)  Wt 188 lb (85.276 kg)  BMI 29.44 kg/m2 Well nourished, well developed, in no acute distress, elderly and frail HEENT: normal Neck: JVD flat. Carotid bruit absent  Cardiac:  normal S1, S2; RRR; no murmur Lungs:  clear to auscultation bilaterally, no wheezing, rhonchi or rales Abd: soft, nontender, no hepatomegaly Ext: Edema trace lower extremity. Pulses 2+ Skin: warm and dry Neuro:  CNs 2-12 intact, no focal abnormalities noted  EKG:  Not repeated       Signed, Illene Labrador III, MD 06/25/2013 10:20 AM

## 2013-06-25 NOTE — Patient Instructions (Addendum)
Your physician recommends that you have lab work today: BMET  Your physician recommends that you schedule a follow-up appointment in: 1 week with [name]   Your physician recommends that you continue on your current medications as directed. Please refer to the Current Medication list given to you today.

## 2013-06-30 ENCOUNTER — Encounter: Payer: Medicare Other | Admitting: Nurse Practitioner

## 2013-07-01 ENCOUNTER — Other Ambulatory Visit: Payer: Self-pay | Admitting: Interventional Cardiology

## 2013-07-02 ENCOUNTER — Ambulatory Visit (INDEPENDENT_AMBULATORY_CARE_PROVIDER_SITE_OTHER): Payer: Medicare Other | Admitting: Interventional Cardiology

## 2013-07-02 ENCOUNTER — Encounter: Payer: Self-pay | Admitting: Interventional Cardiology

## 2013-07-02 VITALS — BP 124/58 | HR 61 | Ht 67.0 in | Wt 190.2 lb

## 2013-07-02 DIAGNOSIS — N183 Chronic kidney disease, stage 3 unspecified: Secondary | ICD-10-CM

## 2013-07-02 DIAGNOSIS — I10 Essential (primary) hypertension: Secondary | ICD-10-CM

## 2013-07-02 DIAGNOSIS — I5032 Chronic diastolic (congestive) heart failure: Secondary | ICD-10-CM

## 2013-07-02 DIAGNOSIS — I4891 Unspecified atrial fibrillation: Secondary | ICD-10-CM

## 2013-07-02 DIAGNOSIS — I480 Paroxysmal atrial fibrillation: Secondary | ICD-10-CM

## 2013-07-02 DIAGNOSIS — Z7901 Long term (current) use of anticoagulants: Secondary | ICD-10-CM

## 2013-07-02 MED ORDER — TORSEMIDE 20 MG PO TABS: ORAL_TABLET | ORAL | Status: DC

## 2013-07-02 NOTE — Progress Notes (Signed)
Patient ID: [name], male   DOB: 09/27/1918, 80 y.o.   MRN: [id number]    [address] [place], [place]  [address] Phone: [phone] Fax:  [phone]  Date:  07/02/2013   ID:  [name], DOB 10/03/1918, MRN [id number]  PCP:  [name], MD   ASSESSMENT:  1. Chronic diastolic heart failure, stable although weight has not significantly decreased over the past 10 days. 2. Blood pressure under control 3. Chronic kidney disease  PLAN:  1. Basic metabolic panel today 2. Change torsemide to 80 mg in the a.m. and 60 mg in the p.m. 3. Clinical followup in 2 weeks   SUBJECTIVE: [name] is a 80 y.o. male who has no complaint of dyspnea or swelling. He feels he may be mildly dehydrated. He has not had lightheadedness or dizziness. No palpitations or syncope. He denies orthopnea   Wt Readings from Last 3 Encounters:  07/02/13 190 lb 3.2 oz (86.274 kg)  06/25/13 188 lb (85.276 kg)  06/22/13 190 lb 14.7 oz (86.6 kg)     Past Medical History  Diagnosis Date  . Diabetes mellitus   . Hyperlipidemia   . Coronary artery disease   . Spinal stenosis   . Pneumonia   . Pacemaker   . Anemia   . Carotid artery disease   . PVD (peripheral vascular disease)   . Hyponatremia     Chronic  . CVA (cerebral infarction)   . CKD (chronic kidney disease) stage 3, GFR 30-59 ml/min   . Long term (current) use of anticoagulants     No bleeding on Eliquis  . Dysrhythmia   . PSVT (paroxysmal supraventricular tachycardia)   . PAF (paroxysmal atrial fibrillation)     Asymptomatic. On Eliquis.  . AV block, 3rd degree     With DDD St. Jude PM, initially placed in 1993 by Dr. Nils Pyle. Device upgrade 10/2002 to DDD, by Dr. Rollene Fare complicated by bleeding.  . Essential hypertension, benign   . CHF (congestive heart failure)   . Chronic diastolic congestive heart failure     ECHO 05/20/12 LVEF estimated by 2D at 55-60%  . Arthritis     Current Outpatient  Prescriptions  Medication Sig Dispense Refill  . allopurinol (ZYLOPRIM) 100 MG tablet Take 100 mg by mouth at bedtime.      [name] amiodarone (PACERONE) 200 MG tablet Take 1 tablet (200 mg total) by mouth daily.      [name] dutasteride (AVODART) 0.5 MG capsule Take 0.5 mg by mouth every morning.       [name] ELIQUIS 2.5 MG TABS tablet take 1 tablet by mouth twice a day  60 tablet  5  . ferrous sulfate 325 (65 FE) MG tablet Take 325 mg by mouth every other day.       . insulin glargine (LANTUS) 100 UNIT/ML injection Inject 12 Units into the skin at bedtime.       . insulin lispro (HUMALOG) 100 UNIT/ML injection Inject 9-10 Units into the skin 3 (three) times daily before meals. 9 units with breakfast and lunch & 9 units after supper      . metoprolol tartrate (LOPRESSOR) 25 MG tablet Take 25 mg by mouth daily.      . Multiple Vitamin (MULTIVITAMIN WITH MINERALS) TABS Take 1 tablet by mouth daily. Diabetic pak by Petra Kuba from [employer]      . Polyethyl Glycol-Propyl Glycol (SYSTANE) 0.4-0.3 % SOLN Apply 1 drop to eye daily.  Into affected eyes      . polyethylene glycol (MIRALAX / GLYCOLAX) packet Take 17 g by mouth daily as needed. For constipation      . Polyvinyl Alcohol-Povidone (REFRESH OP) Place 1 drop into both eyes 2 (two) times daily. Refresh Gel Opth      . potassium chloride SA (K-DUR,KLOR-CON) 20 MEQ tablet Take 20 mEq by mouth daily.      . silodosin (RAPAFLO) 8 MG CAPS capsule Take 8 mg by mouth daily with breakfast.      . Tafluprost (ZIOPTAN) 0.0015 % SOLN Place 1 drop into both eyes every other day. Use at night      . torsemide (DEMADEX) 20 MG tablet Take 60 mg by mouth 4 (four) times daily. Takes 4 tabs twice a day       No current facility-administered medications for this visit.    Allergies:    Allergies  Allergen Reactions  . Aggrenox [Aspirin-Dipyridamole Er] Other (See Comments)    Dizziness  . Carvedilol Other (See Comments)    Dizziness  . Claritin [Loratadine] Other (See Comments)      Dizziness & drowsiness   . Mucinex [Guaifenesin Er] Other (See Comments)    Dizziness, drowsiness  . Plavix [Clopidogrel Bisulfate] Other (See Comments)    Dizziness  . Rapaflo [Silodosin] Other (See Comments)    Dizziness  . Statins Other (See Comments)    rhabdomyolisis  . Travatan Z [Travoprost] Other (See Comments)    Eye irritation    Social History:  The patient  reports that he has never smoked. He has never used smokeless tobacco. He reports that he does not drink alcohol or use illicit drugs.   ROS:  Please see the history of present illness.      All other systems reviewed and negative.   OBJECTIVE: VS:  BP 124/58  Pulse 61  Ht 5\' 7"  (1.702 m)  Wt 190 lb 3.2 oz (86.274 kg)  BMI 29.78 kg/m2  SpO2 99% Well nourished, well developed, in no acute distress, appears stated age 78: normal Neck: JVD flat neck veins . Carotid bruit absent  Cardiac:  normal S1, S2; RRR; no murmur Lungs:  clear to auscultation bilaterally, no wheezing, rhonchi or rales Abd: soft, nontender, no hepatomegaly Ext: Edema absent. Pulses 2+ Skin: warm and dry Neuro:  CNs 2-12 intact, no focal abnormalities noted  EKG:  Not repeated       Signed, Illene Labrador III, MD 07/02/2013 10:48 AM

## 2013-07-02 NOTE — Patient Instructions (Addendum)
Your physician has recommended you make the following change in your medication:  1)Take 80mg  of Torsemide in the am, and 60mg  in the pm  Take all other medication as prescribed  Your physician recommends that you return for lab work in: 07/03/13 any time between 7:30am-5:30pm   You have a follow up appt with Dr.Smith on 07/17/13 @9 :30am  Your physician recommends that you schedule a follow-up appointment in: 3 months with Dr.Allred in the device clinic

## 2013-07-03 ENCOUNTER — Other Ambulatory Visit (INDEPENDENT_AMBULATORY_CARE_PROVIDER_SITE_OTHER): Payer: Medicare Other

## 2013-07-03 ENCOUNTER — Other Ambulatory Visit: Payer: Self-pay

## 2013-07-03 DIAGNOSIS — I5032 Chronic diastolic (congestive) heart failure: Secondary | ICD-10-CM

## 2013-07-03 LAB — BASIC METABOLIC PANEL
BUN: 31 mg/dL — AB (ref 6–23)
CO2: 34 mEq/L — ABNORMAL HIGH (ref 19–32)
CREATININE: 2.1 mg/dL — AB (ref 0.4–1.5)
Calcium: 9.4 mg/dL (ref 8.4–10.5)
Chloride: 95 mEq/L — ABNORMAL LOW (ref 96–112)
GFR: 38.73 mL/min — ABNORMAL LOW (ref >60.00)
Glucose, Bld: 186 mg/dL — ABNORMAL HIGH (ref 70–99)
Potassium: 4 mEq/L (ref 3.5–5.1)
Sodium: 137 mEq/L (ref 135–145)

## 2013-07-03 MED ORDER — TORSEMIDE 20 MG PO TABS: ORAL_TABLET | ORAL | Status: DC

## 2013-07-08 ENCOUNTER — Telehealth: Payer: Self-pay | Admitting: Interventional Cardiology

## 2013-07-08 ENCOUNTER — Encounter: Payer: Medicare Other | Admitting: Internal Medicine

## 2013-07-08 NOTE — Telephone Encounter (Signed)
returned pt call. lmom for pt to return call. 

## 2013-07-08 NOTE — Telephone Encounter (Signed)
New message ° ° ° ° °Pt want lab results °

## 2013-07-08 NOTE — Telephone Encounter (Signed)
pt given prelim lab results.pt adv that Dr.Smith is aware of lab results.he did inot indicate any changes at this time.pt verbalized understanding.

## 2013-07-08 NOTE — Telephone Encounter (Signed)
Follow up  ° ° °Patient calling back to speak with nurse  °

## 2013-07-08 NOTE — Telephone Encounter (Signed)
2nd attempt.returned pt call.lmom on pt cell and home # listed

## 2013-07-14 ENCOUNTER — Telehealth: Payer: Self-pay | Admitting: Interventional Cardiology

## 2013-07-14 NOTE — Telephone Encounter (Signed)
Feels weak, but not short of breath and HR and weight are stable. Please have him come in on tomorrow for BMET, Dx [phone]

## 2013-07-15 ENCOUNTER — Telehealth: Payer: Self-pay

## 2013-07-15 ENCOUNTER — Other Ambulatory Visit (INDEPENDENT_AMBULATORY_CARE_PROVIDER_SITE_OTHER): Payer: Medicare Other

## 2013-07-15 DIAGNOSIS — I5032 Chronic diastolic (congestive) heart failure: Secondary | ICD-10-CM

## 2013-07-15 NOTE — Telephone Encounter (Signed)
pt rqst "CK" be added to his lab draw today.ok with Dr.Smith

## 2013-07-15 NOTE — Telephone Encounter (Signed)
pt rqst "free K" potassium be added to his lab draw today

## 2013-07-15 NOTE — Telephone Encounter (Signed)
called and spoke with  pt caregiver [name].per Dr.Smith pt is to come in this morning for a lab appt bmet.pt agreeable with plan and verbalized understanding.

## 2013-07-16 ENCOUNTER — Other Ambulatory Visit: Payer: Medicare Other

## 2013-07-16 LAB — BASIC METABOLIC PANEL
BUN: 33 mg/dL — ABNORMAL HIGH (ref 6–23)
CALCIUM: 9.4 mg/dL (ref 8.4–10.5)
CO2: 31 mEq/L (ref 19–32)
Chloride: 95 mEq/L — ABNORMAL LOW (ref 96–112)
Creatinine, Ser: 2 mg/dL — ABNORMAL HIGH (ref 0.4–1.5)
GFR: 39.61 mL/min — AB (ref >60.00)
GLUCOSE: 245 mg/dL — AB (ref 70–99)
Potassium: 4.1 mEq/L (ref 3.5–5.1)
SODIUM: 136 meq/L (ref 135–145)

## 2013-07-16 LAB — CK: Total CK: 202 U/L (ref 7–232)

## 2013-07-17 ENCOUNTER — Ambulatory Visit: Payer: Medicare Other | Admitting: Interventional Cardiology

## 2013-07-23 ENCOUNTER — Telehealth: Payer: Self-pay

## 2013-07-23 NOTE — Telephone Encounter (Signed)
Message copied by Lamar Laundry on Thu Jul 23, 2013  1:47 PM ------      Message from: Daneen Schick      Created: Sat Jul 18, 2013  9:56 AM       Labs are stable and kidney func/BUN/potassium unchanged. CK is normal. ------

## 2013-07-23 NOTE — Telephone Encounter (Signed)
pt caretaker [name] aware pt appt made for 08/10/13 @ 9am

## 2013-07-23 NOTE — Telephone Encounter (Signed)
pt already aware of labs.Labs are stable and kidney func/BUN/potassium unchanged. CK is normal.

## 2013-07-29 NOTE — Progress Notes (Signed)
See discharge summary

## 2013-08-03 ENCOUNTER — Emergency Department (HOSPITAL_COMMUNITY): Payer: Medicare Other

## 2013-08-03 ENCOUNTER — Inpatient Hospital Stay (HOSPITAL_COMMUNITY)
Admission: EM | Admit: 2013-08-03 | Discharge: 2013-08-14 | DRG: 291 | Disposition: A | Discharge status: Home Health | Payer: Medicare Other | Attending: Interventional Cardiology | Admitting: Interventional Cardiology

## 2013-08-03 ENCOUNTER — Encounter (HOSPITAL_COMMUNITY): Payer: Self-pay | Admitting: Emergency Medicine

## 2013-08-03 DIAGNOSIS — D649 Anemia, unspecified: Secondary | ICD-10-CM | POA: Diagnosis present

## 2013-08-03 DIAGNOSIS — R05 Cough: Secondary | ICD-10-CM

## 2013-08-03 DIAGNOSIS — I739 Peripheral vascular disease, unspecified: Secondary | ICD-10-CM | POA: Diagnosis present

## 2013-08-03 DIAGNOSIS — N179 Acute kidney failure, unspecified: Secondary | ICD-10-CM | POA: Diagnosis present

## 2013-08-03 DIAGNOSIS — N189 Chronic kidney disease, unspecified: Secondary | ICD-10-CM | POA: Diagnosis not present

## 2013-08-03 DIAGNOSIS — J329 Chronic sinusitis, unspecified: Secondary | ICD-10-CM

## 2013-08-03 DIAGNOSIS — J3489 Other specified disorders of nose and nasal sinuses: Secondary | ICD-10-CM | POA: Diagnosis present

## 2013-08-03 DIAGNOSIS — R222 Localized swelling, mass and lump, trunk: Secondary | ICD-10-CM | POA: Diagnosis present

## 2013-08-03 DIAGNOSIS — R599 Enlarged lymph nodes, unspecified: Secondary | ICD-10-CM | POA: Diagnosis present

## 2013-08-03 DIAGNOSIS — N184 Chronic kidney disease, stage 4 (severe): Secondary | ICD-10-CM | POA: Diagnosis present

## 2013-08-03 DIAGNOSIS — I129 Hypertensive chronic kidney disease with stage 1 through stage 4 chronic kidney disease, or unspecified chronic kidney disease: Secondary | ICD-10-CM | POA: Diagnosis present

## 2013-08-03 DIAGNOSIS — R059 Cough, unspecified: Secondary | ICD-10-CM

## 2013-08-03 DIAGNOSIS — E871 Hypo-osmolality and hyponatremia: Secondary | ICD-10-CM | POA: Diagnosis present

## 2013-08-03 DIAGNOSIS — J189 Pneumonia, unspecified organism: Secondary | ICD-10-CM

## 2013-08-03 DIAGNOSIS — Z794 Long term (current) use of insulin: Secondary | ICD-10-CM

## 2013-08-03 DIAGNOSIS — Z807 Family history of other malignant neoplasms of lymphoid, hematopoietic and related tissues: Secondary | ICD-10-CM

## 2013-08-03 DIAGNOSIS — J810 Acute pulmonary edema: Secondary | ICD-10-CM

## 2013-08-03 DIAGNOSIS — D696 Thrombocytopenia, unspecified: Secondary | ICD-10-CM | POA: Diagnosis not present

## 2013-08-03 DIAGNOSIS — I5033 Acute on chronic diastolic (congestive) heart failure: Principal | ICD-10-CM

## 2013-08-03 DIAGNOSIS — M4800 Spinal stenosis, site unspecified: Secondary | ICD-10-CM | POA: Diagnosis present

## 2013-08-03 DIAGNOSIS — Z8249 Family history of ischemic heart disease and other diseases of the circulatory system: Secondary | ICD-10-CM

## 2013-08-03 DIAGNOSIS — E119 Type 2 diabetes mellitus without complications: Secondary | ICD-10-CM | POA: Diagnosis present

## 2013-08-03 DIAGNOSIS — M129 Arthropathy, unspecified: Secondary | ICD-10-CM | POA: Diagnosis present

## 2013-08-03 DIAGNOSIS — I5031 Acute diastolic (congestive) heart failure: Secondary | ICD-10-CM

## 2013-08-03 DIAGNOSIS — Z8673 Personal history of transient ischemic attack (TIA), and cerebral infarction without residual deficits: Secondary | ICD-10-CM

## 2013-08-03 DIAGNOSIS — I4891 Unspecified atrial fibrillation: Secondary | ICD-10-CM | POA: Diagnosis not present

## 2013-08-03 DIAGNOSIS — I2510 Atherosclerotic heart disease of native coronary artery without angina pectoris: Secondary | ICD-10-CM | POA: Diagnosis present

## 2013-08-03 DIAGNOSIS — Z96659 Presence of unspecified artificial knee joint: Secondary | ICD-10-CM

## 2013-08-03 DIAGNOSIS — R590 Localized enlarged lymph nodes: Secondary | ICD-10-CM

## 2013-08-03 DIAGNOSIS — I509 Heart failure, unspecified: Secondary | ICD-10-CM

## 2013-08-03 DIAGNOSIS — I10 Essential (primary) hypertension: Secondary | ICD-10-CM

## 2013-08-03 DIAGNOSIS — J309 Allergic rhinitis, unspecified: Secondary | ICD-10-CM | POA: Diagnosis present

## 2013-08-03 DIAGNOSIS — Z7901 Long term (current) use of anticoagulants: Secondary | ICD-10-CM

## 2013-08-03 DIAGNOSIS — I480 Paroxysmal atrial fibrillation: Secondary | ICD-10-CM | POA: Diagnosis present

## 2013-08-03 DIAGNOSIS — R911 Solitary pulmonary nodule: Secondary | ICD-10-CM | POA: Diagnosis present

## 2013-08-03 DIAGNOSIS — Z950 Presence of cardiac pacemaker: Secondary | ICD-10-CM

## 2013-08-03 DIAGNOSIS — J9600 Acute respiratory failure, unspecified whether with hypoxia or hypercapnia: Secondary | ICD-10-CM

## 2013-08-03 DIAGNOSIS — N183 Chronic kidney disease, stage 3 unspecified: Secondary | ICD-10-CM | POA: Diagnosis present

## 2013-08-03 DIAGNOSIS — E785 Hyperlipidemia, unspecified: Secondary | ICD-10-CM | POA: Diagnosis present

## 2013-08-03 DIAGNOSIS — Z888 Allergy status to other drugs, medicaments and biological substances status: Secondary | ICD-10-CM

## 2013-08-03 DIAGNOSIS — K5900 Constipation, unspecified: Secondary | ICD-10-CM | POA: Diagnosis not present

## 2013-08-03 DIAGNOSIS — R339 Retention of urine, unspecified: Secondary | ICD-10-CM | POA: Diagnosis present

## 2013-08-03 DIAGNOSIS — E876 Hypokalemia: Secondary | ICD-10-CM | POA: Diagnosis not present

## 2013-08-03 HISTORY — DX: Chronic kidney disease, stage 4 (severe): N18.4

## 2013-08-03 LAB — GLUCOSE, CAPILLARY
GLUCOSE-CAPILLARY: 187 mg/dL — AB (ref 70–99)
Glucose-Capillary: 183 mg/dL — ABNORMAL HIGH (ref 70–99)

## 2013-08-03 LAB — CBC
HCT: 36.6 % — ABNORMAL LOW (ref 39.0–52.0)
Hemoglobin: 12.2 g/dL — ABNORMAL LOW (ref 13.0–17.0)
MCH: 32.7 pg (ref 26.0–34.0)
MCHC: 33.3 g/dL (ref 30.0–36.0)
MCV: 98.1 fL (ref 78.0–100.0)
PLATELETS: 190 10*3/uL (ref 150–400)
RBC: 3.73 MIL/uL — ABNORMAL LOW (ref 4.22–5.81)
RDW: 14.8 % (ref 11.5–15.5)
WBC: 8.1 10*3/uL (ref 4.0–10.5)

## 2013-08-03 LAB — BASIC METABOLIC PANEL
BUN: 40 mg/dL — ABNORMAL HIGH (ref 6–23)
CO2: 27 mEq/L (ref 19–32)
Calcium: 9.5 mg/dL (ref 8.4–10.5)
Chloride: 94 mEq/L — ABNORMAL LOW (ref 96–112)
Creatinine, Ser: 2.16 mg/dL — ABNORMAL HIGH (ref 0.50–1.35)
GFR calc Af Amer: 29 mL/min — ABNORMAL LOW (ref >90)
GFR calc non Af Amer: 25 mL/min — ABNORMAL LOW (ref >90)
Glucose, Bld: 203 mg/dL — ABNORMAL HIGH (ref 70–99)
Potassium: 3.8 mEq/L (ref 3.7–5.3)
Sodium: 138 mEq/L (ref 137–147)

## 2013-08-03 LAB — TROPONIN I: Troponin I: <0.3 ng/mL (ref <0.30)

## 2013-08-03 LAB — PRO B NATRIURETIC PEPTIDE: Pro B Natriuretic peptide (BNP): 2968 pg/mL — ABNORMAL HIGH (ref 0–450)

## 2013-08-03 LAB — CBG MONITORING, ED: GLUCOSE-CAPILLARY: 265 mg/dL — AB (ref 70–99)

## 2013-08-03 LAB — STREP PNEUMONIAE URINARY ANTIGEN: Strep Pneumo Urinary Antigen: NEGATIVE

## 2013-08-03 MED ORDER — FUROSEMIDE 10 MG/ML IJ SOLN
40.0000 mg | Freq: Two times a day (BID) | INTRAMUSCULAR | Status: DC
  Administered 2013-08-03 – 2013-08-04 (×2): 40 mg via INTRAVENOUS
  Filled 2013-08-03 (×5): qty 4

## 2013-08-03 MED ORDER — DEXTROSE 5 % IV SOLN
1.0000 g | INTRAVENOUS | Status: AC
  Administered 2013-08-03 – 2013-08-10 (×8): 1 g via INTRAVENOUS
  Filled 2013-08-03 (×9): qty 10

## 2013-08-03 MED ORDER — TAMSULOSIN HCL 0.4 MG PO CAPS
0.4000 mg | ORAL_CAPSULE | Freq: Every day | ORAL | Status: DC
  Administered 2013-08-04 – 2013-08-13 (×10): 0.4 mg via ORAL
  Filled 2013-08-03 (×12): qty 1

## 2013-08-03 MED ORDER — NITROGLYCERIN 0.4 MG SL SUBL: 0.4000 mg | SUBLINGUAL_TABLET | SUBLINGUAL | Status: DC | PRN

## 2013-08-03 MED ORDER — NITROGLYCERIN IN D5W 200-5 MCG/ML-% IV SOLN: 2.0000 ug/min | INTRAVENOUS | Status: DC

## 2013-08-03 MED ORDER — NITROGLYCERIN IN D5W 200-5 MCG/ML-% IV SOLN
2.0000 ug/min | INTRAVENOUS | Status: DC
  Administered 2013-08-03 (×2): 5 ug/min via INTRAVENOUS
  Filled 2013-08-03: qty 250

## 2013-08-03 MED ORDER — APIXABAN 2.5 MG PO TABS
2.5000 mg | ORAL_TABLET | Freq: Two times a day (BID) | ORAL | Status: DC
  Administered 2013-08-03 – 2013-08-13 (×21): 2.5 mg via ORAL
  Filled 2013-08-03 (×23): qty 1

## 2013-08-03 MED ORDER — METOPROLOL TARTRATE 25 MG PO TABS
25.0000 mg | ORAL_TABLET | Freq: Every day | ORAL | Status: DC
  Administered 2013-08-04 – 2013-08-13 (×10): 25 mg via ORAL
  Filled 2013-08-03 (×11): qty 1

## 2013-08-03 MED ORDER — DUTASTERIDE 0.5 MG PO CAPS
0.5000 mg | ORAL_CAPSULE | Freq: Every day | ORAL | Status: DC
  Administered 2013-08-04 – 2013-08-13 (×10): 0.5 mg via ORAL
  Filled 2013-08-03 (×11): qty 1

## 2013-08-03 MED ORDER — SODIUM CHLORIDE 0.9 % IV SOLN
250.0000 mL | INTRAVENOUS | Status: DC | PRN
  Administered 2013-08-03: 250 mL via INTRAVENOUS

## 2013-08-03 MED ORDER — POLYETHYLENE GLYCOL 3350 17 G PO PACK
17.0000 g | PACK | Freq: Every day | ORAL | Status: DC | PRN
  Administered 2013-08-05 – 2013-08-14 (×2): 17 g via ORAL
  Filled 2013-08-03 (×4): qty 1

## 2013-08-03 MED ORDER — INSULIN ASPART 100 UNIT/ML ~~LOC~~ SOLN
0.0000 [IU] | Freq: Three times a day (TID) | SUBCUTANEOUS | Status: DC
  Administered 2013-08-03: 8 [IU] via SUBCUTANEOUS
  Administered 2013-08-04 (×2): 3 [IU] via SUBCUTANEOUS
  Administered 2013-08-04 – 2013-08-05 (×2): 8 [IU] via SUBCUTANEOUS
  Administered 2013-08-05: 2 [IU] via SUBCUTANEOUS
  Administered 2013-08-05: 3 [IU] via SUBCUTANEOUS
  Administered 2013-08-06: 2 [IU] via SUBCUTANEOUS
  Administered 2013-08-06 – 2013-08-07 (×2): 5 [IU] via SUBCUTANEOUS
  Administered 2013-08-07 – 2013-08-08 (×4): 3 [IU] via SUBCUTANEOUS
  Administered 2013-08-08 – 2013-08-09 (×2): 8 [IU] via SUBCUTANEOUS
  Administered 2013-08-09: 3 [IU] via SUBCUTANEOUS
  Administered 2013-08-10 (×2): 5 [IU] via SUBCUTANEOUS
  Administered 2013-08-10: 2 [IU] via SUBCUTANEOUS
  Administered 2013-08-11: 8 [IU] via SUBCUTANEOUS
  Administered 2013-08-11: 5 [IU] via SUBCUTANEOUS
  Administered 2013-08-12: 8 [IU] via SUBCUTANEOUS
  Administered 2013-08-12: 2 [IU] via SUBCUTANEOUS
  Administered 2013-08-12: 3 [IU] via SUBCUTANEOUS
  Administered 2013-08-13: 5 [IU] via SUBCUTANEOUS
  Administered 2013-08-13: 3 [IU] via SUBCUTANEOUS
  Administered 2013-08-13: 8 [IU] via SUBCUTANEOUS
  Administered 2013-08-14: 2 [IU] via SUBCUTANEOUS
  Filled 2013-08-03: qty 1

## 2013-08-03 MED ORDER — LATANOPROST 0.005 % OP SOLN
1.0000 [drp] | Freq: Every day | OPHTHALMIC | Status: DC
  Administered 2013-08-03 – 2013-08-13 (×11): 1 [drp] via OPHTHALMIC
  Filled 2013-08-03 (×2): qty 2.5

## 2013-08-03 MED ORDER — AMIODARONE HCL 200 MG PO TABS
200.0000 mg | ORAL_TABLET | Freq: Every day | ORAL | Status: DC
  Administered 2013-08-04 – 2013-08-13 (×10): 200 mg via ORAL
  Filled 2013-08-03 (×11): qty 1

## 2013-08-03 MED ORDER — SODIUM CHLORIDE 0.9 % IJ SOLN: 3.0000 mL | INTRAMUSCULAR | Status: DC | PRN

## 2013-08-03 MED ORDER — DEXTROSE 5 % IV SOLN
500.0000 mg | INTRAVENOUS | Status: AC
  Administered 2013-08-03 – 2013-08-06 (×4): 500 mg via INTRAVENOUS
  Filled 2013-08-03 (×5): qty 500

## 2013-08-03 MED ORDER — POTASSIUM CHLORIDE CRYS ER 20 MEQ PO TBCR
20.0000 meq | EXTENDED_RELEASE_TABLET | Freq: Every day | ORAL | Status: DC
  Administered 2013-08-04: 20 meq via ORAL
  Filled 2013-08-03 (×2): qty 1

## 2013-08-03 MED ORDER — CARBOXYMETHYLCELLULOSE SODIUM 1 % OP SOLN: 1.0000 [drp] | Freq: Two times a day (BID) | OPHTHALMIC | Status: DC

## 2013-08-03 MED ORDER — FUROSEMIDE 10 MG/ML IJ SOLN
40.0000 mg | Freq: Once | INTRAMUSCULAR | Status: AC
  Administered 2013-08-03: 40 mg via INTRAVENOUS
  Filled 2013-08-03: qty 4

## 2013-08-03 MED ORDER — ADULT MULTIVITAMIN W/MINERALS CH
1.0000 | ORAL_TABLET | Freq: Every day | ORAL | Status: DC
  Administered 2013-08-04 – 2013-08-13 (×10): 1 via ORAL
  Filled 2013-08-03 (×12): qty 1

## 2013-08-03 MED ORDER — POLYVINYL ALCOHOL 1.4 % OP SOLN
1.0000 [drp] | Freq: Every day | OPHTHALMIC | Status: DC
  Administered 2013-08-04 – 2013-08-13 (×9): 1 [drp] via OPHTHALMIC
  Filled 2013-08-03: qty 15

## 2013-08-03 MED ORDER — ONDANSETRON HCL 4 MG/2ML IJ SOLN
4.0000 mg | Freq: Four times a day (QID) | INTRAMUSCULAR | Status: DC | PRN
  Administered 2013-08-06 – 2013-08-09 (×2): 4 mg via INTRAVENOUS
  Filled 2013-08-03 (×2): qty 2

## 2013-08-03 MED ORDER — FUROSEMIDE 10 MG/ML IJ SOLN
40.0000 mg | Freq: Once | INTRAMUSCULAR | Status: AC
  Administered 2013-08-03: 40 mg via INTRAVENOUS

## 2013-08-03 MED ORDER — FERROUS SULFATE 325 (65 FE) MG PO TABS
325.0000 mg | ORAL_TABLET | ORAL | Status: DC
  Administered 2013-08-05 – 2013-08-13 (×5): 325 mg via ORAL
  Filled 2013-08-03 (×6): qty 1

## 2013-08-03 MED ORDER — INSULIN GLARGINE 100 UNIT/ML ~~LOC~~ SOLN
11.0000 [IU] | Freq: Every day | SUBCUTANEOUS | Status: DC
  Administered 2013-08-03 – 2013-08-13 (×11): 11 [IU] via SUBCUTANEOUS
  Filled 2013-08-03 (×13): qty 0.11

## 2013-08-03 MED ORDER — ALLOPURINOL 100 MG PO TABS
100.0000 mg | ORAL_TABLET | Freq: Every day | ORAL | Status: DC
Start: 2013-08-03 — End: 2013-08-14
  Administered 2013-08-03 – 2013-08-13 (×11): 100 mg via ORAL
  Filled 2013-08-03 (×12): qty 1

## 2013-08-03 MED ORDER — SODIUM CHLORIDE 0.9 % IJ SOLN
3.0000 mL | Freq: Two times a day (BID) | INTRAMUSCULAR | Status: DC
  Administered 2013-08-03 – 2013-08-13 (×19): 3 mL via INTRAVENOUS

## 2013-08-03 MED ORDER — DEXTROMETHORPHAN POLISTIREX 30 MG/5ML PO LQCR
30.0000 mg | Freq: Two times a day (BID) | ORAL | Status: DC | PRN
  Administered 2013-08-04 – 2013-08-05 (×3): 30 mg via ORAL
  Filled 2013-08-03 (×5): qty 5

## 2013-08-03 NOTE — Consult Note (Signed)
Name: [name] MRN: [id number] DOB: 1918-10-28    ADMISSION DATE:  08/03/2013 CONSULTATION DATE:  08/03/13  REFERRING MD :  [name] PRIMARY SERVICE:  Cardiology   CHIEF COMPLAINT:  Respiratory Distress   BRIEF PATIENT DESCRIPTION: 80 y/o M admitted with worsening cough, chest tightness, shortness of breath.  Work up noted bilateral pleural effusions.  Pt decompensated evening of admission with hypertensive episode which responded to lasix, NTG & bipap.  PCCM consulted for pulmonary evaluation.   SIGNIFICANT EVENTS / STUDIES:  3/23 - admit with worsening cough, chest tightness, shortness of breath. Decompensated w hypertensive episode pm of admit, responded to lasix, NTG & bipap  LINES / TUBES:   CULTURES: BCx2 3/23>>>  ANTIBIOTICS: Rocephin 3/23 (empiric CAP)>>> Azithro 3/23 (empiric CAP)>>>  HISTORY OF PRESENT ILLNESS:  80 y/o M with PMH of DM, HLD, HTN, CAD / PVD, Afib on anticoagulation, PAF / hx of 3rd degree AV Block s/p Pacemaker, chronic diastolic CHF who presented to [facility] ER on 3/3 with approx 24 hours of worsening cough, chest tightness & shortness of breath.  Patient reported some sputum production.  Work up noted bilateral pleural effusions, pt to be afebrile, WBC 8.1, neg troponin, proBNP 2968 and sr cr of 2.16 (baseline 1.8-2.0).  Pt admitted to Cardiology service, treated with lasix & ntg gtt.  PM of admission, patient had hypertensive episode   decompensated evening of admission with hypertensive episode (BP 201/87), sudden increase in SOB which responded to lasix, NTG & bipap.  PCCM consulted for pulmonary evaluation.   PAST MEDICAL HISTORY :  Past Medical History  Diagnosis Date  . Diabetes mellitus   . Hyperlipidemia   . Coronary artery disease   . Spinal stenosis   . Pneumonia   . Pacemaker   . Anemia   . Carotid artery disease   . PVD (peripheral vascular disease)   . Hyponatremia     Chronic  . CVA (cerebral infarction)   . CKD (chronic kidney  disease) stage 3, GFR 30-59 ml/min   . Long term (current) use of anticoagulants     No bleeding on Eliquis  . Dysrhythmia   . PSVT (paroxysmal supraventricular tachycardia)   . PAF (paroxysmal atrial fibrillation)     Asymptomatic. On Eliquis.  . AV block, 3rd degree     With DDD St. Jude PM, initially placed in 1993 by Dr. Nils Pyle. Device upgrade 10/2002 to DDD, by Dr. Rollene Fare complicated by bleeding.  . Essential hypertension, benign   . CHF (congestive heart failure)   . Chronic diastolic congestive heart failure     ECHO 05/20/12 LVEF estimated by 2D at 55-60%  . Arthritis    Past Surgical History  Procedure Laterality Date  . Lumbar laminectomy  1995  . Back surgery    . Total knee arthroplasty Left   . Quadriceps tendon repair    . Cataract extraction    . Carotid endarterectomy    . Yag laser application Right 2/95/1884    Procedure: YAG LASER CAPSULOTOMY OF RIGHT EYE;  Surgeon: [name], MD;  Location: [place];  Service: Ophthalmology;  Laterality: Right;  . Tonsillectomy    . Insert / replace / remove pacemaker      DDD St. Jude PM, initially placed in 1993 by Dr. Nils Pyle. Device upgrade 10/2002 to DDD, by Dr. Rollene Fare complicated by bleeding.  . Carotid endarterectomy Right 2006  . Transurethral resection of prostate    . Cardioversion N/A 06/16/2013  Procedure: CARDIOVERSION BEDSIDE;  Surgeon: [name], MD;  Location: [facility];  Service: Cardiovascular;  Laterality: N/A;   Prior to Admission medications   Medication Sig Start Date End Date Taking? Authorizing Provider  allopurinol (ZYLOPRIM) 100 MG tablet Take 100 mg by mouth at bedtime.   Yes Historical Provider, MD  amiodarone (PACERONE) 200 MG tablet Take 200 mg by mouth daily. 06/22/13  Yes Belva Crome III, MD  apixaban (ELIQUIS) 2.5 MG TABS tablet Take 2.5 mg by mouth 2 (two) times daily.   Yes Historical Provider, MD  apixaban (ELIQUIS) 2.5 MG TABS tablet take 1 tablet by mouth twice a day  07/01/13 08/03/13 Yes Belva Crome III, MD  dutasteride (AVODART) 0.5 MG capsule Take 0.5 mg by mouth daily.    Yes Historical Provider, MD  ferrous sulfate 325 (65 FE) MG tablet Take 325 mg by mouth every other day.    Yes Historical Provider, MD  insulin glargine (LANTUS) 100 UNIT/ML injection Inject 11-12 Units into the skin at bedtime.    Yes Historical Provider, MD  insulin lispro (HUMALOG) 100 UNIT/ML injection Inject 2-9 Units into the skin 3 (three) times daily before meals. 9 units with breakfast, 2-3 units at lunch & 9 units after supper   Yes Historical Provider, MD  metoprolol tartrate (LOPRESSOR) 25 MG tablet Take 25 mg by mouth daily.   Yes Historical Provider, MD  Multiple Vitamin (MULTIVITAMIN WITH MINERALS) TABS Take 1 tablet by mouth daily. Diabetic pak by Petra Kuba from [facility]   Yes Historical Provider, MD  Polyethyl Glycol-Propyl Glycol (SYSTANE) 0.4-0.3 % SOLN Apply 1 drop to eye daily. Into affected eyes   Yes Historical Provider, MD  polyethylene glycol (MIRALAX / GLYCOLAX) packet Take 17 g by mouth daily as needed for mild constipation. For constipation   Yes Historical Provider, MD  Polyvinyl Alcohol-Povidone (REFRESH OP) Place 1 drop into both eyes 2 (two) times daily. Refresh Gel Opth   Yes Historical Provider, MD  potassium chloride SA (K-DUR,KLOR-CON) 20 MEQ tablet Take 20 mEq by mouth daily.   Yes Historical Provider, MD  silodosin (RAPAFLO) 8 MG CAPS capsule Take 8 mg by mouth daily after supper.    Yes Historical Provider, MD  Tafluprost (ZIOPTAN) 0.0015 % SOLN Place 1 drop into both eyes at bedtime. Use at night   Yes Historical Provider, MD  torsemide (DEMADEX) 20 MG tablet Take 60-80 mg by mouth 2 (two) times daily. Take 55m in the morning and 631min the evening 07/03/13  Yes HeSinclair GroomsMD   Allergies  Allergen Reactions  . Aggrenox [Aspirin-Dipyridamole Er] Other (See Comments)    Dizziness  . Carvedilol Other (See Comments)    Dizziness  . Claritin  [Loratadine] Other (See Comments)    Dizziness & drowsiness   . Mucinex [Guaifenesin Er] Other (See Comments)    Dizziness, drowsiness  . Plavix [Clopidogrel Bisulfate] Other (See Comments)    Dizziness  . Rapaflo [Silodosin] Other (See Comments)    Dizziness for about 30- 45  minutes  . Statins Other (See Comments)    rhabdomyolisis  . Travatan Z [Travoprost] Other (See Comments)    Eye irritation    FAMILY HISTORY:  Family History  Problem Relation Age of Onset  . Multiple myeloma Father   . Hypertension Mother    SOCIAL HISTORY:  reports that he has never smoked. He has never used smokeless tobacco. He reports that he does not drink alcohol or use illicit  drugs.  REVIEW OF SYSTEMS:  Unable to complete as pt is requiring bipap.  SUBJECTIVE: Pt reports improvement in SOB with bipap  VITAL SIGNS: Temp:  [98 F (36.7 C)-100.5 F (38.1 C)] 100.5 F (38.1 C) (03/23 2000) Pulse Rate:  [59-91] 60 (03/23 2000) Resp:  [16-32] 21 (03/23 2000) BP: (104-209)/(49-93) 142/50 mmHg (03/23 2000) SpO2:  [86 %-100 %] 100 % (03/23 2000) FiO2 (%):  [0 %] 0 % (03/23 1345) Weight:  [192 lb 3.9 oz (87.2 kg)-197 lb 5 oz (89.5 kg)] 192 lb 3.9 oz (87.2 kg) (03/23 1800)  PHYSICAL EXAMINATION: General:  wdwn elderly male in NAD Neuro:  AAOx4, communicates appropriately, brief answers as is on bipap HEENT:  Mm pink/moist Cardiovascular:  s1s2 regular, paced rhythm, no gallop or rub Lungs:  resp's even/non-labored on bipap, lungs bilaterally with few wheezes, crackles lower  Abdomen:  Obese, soft, bsx4 active  Musculoskeletal:  No acute deformities Skin:  Warm/dry, trace edema   Recent Labs Lab 08/03/13 0629  NA 138  K 3.8  CL 94*  CO2 27  BUN 40*  CREATININE 2.16*  GLUCOSE 203*    Recent Labs Lab 08/03/13 0629  HGB 12.2*  HCT 36.6*  WBC 8.1  PLT 190   Dg Chest Portable 1 View  08/03/2013   CLINICAL DATA:  Productive cough.  Shortness of breath.  EXAM: PORTABLE CHEST - 1  VIEW  COMPARISON:  Chest x-ray 06/20/2013.  FINDINGS: Extensive interstitial prominence and patchy bibasilar airspace disease, concerning for bronchitis and probable multilobar bronchopneumonia. Small bilateral pleural effusions. No definite cephalization of the pulmonary vasculature. Heart size is within normal limits. Upper mediastinal contours are unremarkable. Left-sided pacemaker device in position with lead tips projecting over the expected location of the right atrium and right ventricular apex.  IMPRESSION: 1. The appearance of the chest is concerning for bronchitis and multilobar bronchopneumonia, most pronounced throughout the lung bases bilaterally. 2. Small bilateral pleural effusions.   Electronically Signed   By: Vinnie Langton M.D.   On: 08/03/2013 06:41    ASSESSMENT / PLAN:  Acute Respiratory Failure - multifactorial in setting of decompensated CHF, acute hypertensive episode, +/- CAP.  Significant improvement in clinical status with bipap, nitro & lasix.  Reported never smoker.  Hypoxemia - secondary to above  Plan: -bipap as needed for increased WOB -titrate oxygen for sats > 93% -empiric abx as above -diuresis as renal fxn / BP permit -NTG per Cardiology   -f/u cxr in am  -monitor fever curve / leukocytosis -pulmonary hygiene   Noe Gens, NP-C [facility] Pgr: [phone] or [phone]    08/03/2013, 10:11 PM  Attending:  I have seen and examined the patient with nurse practitioner/resident and agree with the note above.   Overall picture seems more consistent with pulmonary edema than with pneumonia as he has done well with IV nitroglycerin, lasix, and BIPAP. Hopefully off BIPAP by AM 3/24  CC time 40 minutes.  Jillyn Hidden PCCM Pager: [phone] Cell: [phone] If no response, call [phone]

## 2013-08-03 NOTE — Progress Notes (Signed)
1630 Pt complained of bladder fulness . Bladder no tenderness on palpation . Soft to touch . Referred to [place] . To bladder scan pt , if >250 cc to cath pt . Also made aware that pt's wishes to start antibiotics . [place] , [place] said that antibiotics is not  Indicated this time as discussed with Dr. Irish Lack earlier . Made pt and family aware. 1640 Bladder scan done . Residual is 0. Call to [place] done. Dr [name] came in this time

## 2013-08-03 NOTE — Progress Notes (Signed)
1645  pt complained of chest congestion . With audible rales and gurgling.. On sat 90's 2l /m [place] HR 70's pt anxious  Skin cool to touch . DR [name] in . With orders lasix ivp given .Foley cath F 17 inserted aseptically . RRT called and seen the pt . Respiratory therapist and placed on Bipap as ordered.

## 2013-08-03 NOTE — ED Notes (Signed)
Called flow and left message RE pt placement.

## 2013-08-03 NOTE — Progress Notes (Addendum)
       Patient Name: [name] Date of Encounter: 08/03/2013     SUBJECTIVE:Had sudden worsening of dyspnea and chest tightness with O2 sats decreasing to 88% despite supplemental oxygen at 2.5 L/min. No cough, chills, or fever.  TELEMETRY:  NSR at 80 bpm Filed Vitals:   08/03/13 1223 08/03/13 1230 08/03/13 1255 08/03/13 1345  BP: 144/68 158/78 143/62   Pulse:  64 63   Temp:   98 F (36.7 C)   TempSrc:   Oral   Resp: 18 18 18    Height:   5\' 7"  (1.702 m)   Weight:   197 lb 5 oz (89.5 kg)   SpO2: 98% 95% 99% 99%    Intake/Output Summary (Last 24 hours) at 08/03/13 1720 Last data filed at 08/03/13 1611  Gross per 24 hour  Intake    240 ml  Output    282 ml  Net    -42 ml    LABS: Basic Metabolic Panel:  Recent Labs  08/03/13 0629  NA 138  K 3.8  CL 94*  CO2 27  GLUCOSE 203*  BUN 40*  CREATININE 2.16*  CALCIUM 9.5   CBC:  Recent Labs  08/03/13 0629  WBC 8.1  HGB 12.2*  HCT 36.6*  MCV 98.1  PLT 190   Cardiac Enzymes:  Recent Labs  08/03/13 0629  TROPONINI <0.30   BNP    Component Value Date/Time   PROBNP 2968.0* 08/03/2013 0629    Radiology/Studies:  CHF  Physical Exam: Blood pressure 143/62, pulse 63, temperature 98 F (36.7 C), temperature source Oral, resp. rate 18, height 5\' 7"  (1.702 m), weight 197 lb 5 oz (89.5 kg), SpO2 99.00%. Weight change:   RR 28/min and in distress Rattling rhonchi and wheezes throughout both lung fields. No gallop or rub No significant edema.  ASSESSMENT:  1. Acute on chronic DHF with pulmonary edema 2. Radiology raises question of PNA, but there is no fever, chill, or leukocytosis 3. Severe blood pressure elevation  Plan:  1. BiPAP 2. IV Lasix 3. Transfer to Unit 4. Get CCM help for respiratory issues.  Demetrios Isaacs 08/03/2013, 5:20 PM

## 2013-08-03 NOTE — Progress Notes (Signed)
eLink Physician-Brief Progress Note Patient Name: [name] DOB: 1919-04-08 MRN: [id number]  Date of Service  08/03/2013   HPI/Events of Note   Asked by Cards to help out .  I am concerned Re: PNA in this situation.  See orders  eICU Interventions  ABX ordered.  PCCM will see as a consult   Intervention Category Intermediate Interventions: Infection - evaluation and management  Asencion Noble 08/03/2013, 7:51 PM

## 2013-08-03 NOTE — ED Provider Notes (Signed)
CSN: [id number]     Arrival date & time 08/03/13  0547 History   First MD Initiated Contact with Patient 08/03/13 [phone]     Chief Complaint  Patient presents with  . Congestive Heart Failure     (Consider location/radiation/quality/duration/timing/severity/associated sxs/prior Treatment) HPI Patient presents to the emergency department with an exacerbation of his CHF.  The patient, states around 10:00 last night he started getting short of breath and felt tightness in his chest.  Patient, states, that he has been taking his normal medications without difficulty the patient, states, that he has not had any weakness, dizziness, headache, blurred vision, back pain, nausea, vomiting, diarrhea, blurred vision, neck pain, increased urination, increased thirst, fever, or syncope.  The patient, states, that nothing seems to make his condition, better.  Exertion seemed to make his condition worse  Past Medical History  Diagnosis Date  . Diabetes mellitus   . Hyperlipidemia   . Coronary artery disease   . Spinal stenosis   . Pneumonia   . Pacemaker   . Anemia   . Carotid artery disease   . PVD (peripheral vascular disease)   . Hyponatremia     Chronic  . CVA (cerebral infarction)   . CKD (chronic kidney disease) stage 3, GFR 30-59 ml/min   . Long term (current) use of anticoagulants     No bleeding on Eliquis  . Dysrhythmia   . PSVT (paroxysmal supraventricular tachycardia)   . PAF (paroxysmal atrial fibrillation)     Asymptomatic. On Eliquis.  . AV block, 3rd degree     With DDD St. Jude PM, initially placed in 1993 by Dr. Nils Pyle. Device upgrade 10/2002 to DDD, by Dr. Rollene Fare complicated by bleeding.  . Essential hypertension, benign   . CHF (congestive heart failure)   . Chronic diastolic congestive heart failure     ECHO 05/20/12 LVEF estimated by 2D at 55-60%  . Arthritis    Past Surgical History  Procedure Laterality Date  . Lumbar laminectomy  1995  . Back surgery    . Total  knee arthroplasty Left   . Quadriceps tendon repair    . Cataract extraction    . Carotid endarterectomy    . Yag laser application Right 1/74/0814    Procedure: YAG LASER CAPSULOTOMY OF RIGHT EYE;  Surgeon: [name], MD;  Location: [facility];  Service: Ophthalmology;  Laterality: Right;  . Tonsillectomy    . Insert / replace / remove pacemaker      DDD St. Jude PM, initially placed in 1993 by Dr. Nils Pyle. Device upgrade 10/2002 to DDD, by Dr. Rollene Fare complicated by bleeding.  . Carotid endarterectomy Right 2006  . Transurethral resection of prostate    . Cardioversion N/A 06/16/2013    Procedure: CARDIOVERSION BEDSIDE;  Surgeon: [name], MD;  Location: [facility] OR;  Service: Cardiovascular;  Laterality: N/A;   Family History  Problem Relation Age of Onset  . Multiple myeloma Father   . Hypertension Mother    History  Substance Use Topics  . Smoking status: Never Smoker   . Smokeless tobacco: Never Used  . Alcohol Use: No     Comment: Cocktails occasionally    Review of Systems  All other systems negative except as documented in the HPI. All pertinent positives and negatives as reviewed in the HPI.  Allergies  Aggrenox; Carvedilol; Claritin; Mucinex; Plavix; Rapaflo; Statins; and Travatan z  Home Medications   Current Outpatient Rx  Name  Route  Sig  Dispense  Refill  . allopurinol (ZYLOPRIM) 100 MG tablet   Oral   Take 100 mg by mouth at bedtime.         [name] amiodarone (PACERONE) 200 MG tablet   Oral   Take 1 tablet (200 mg total) by mouth daily.         [name] dutasteride (AVODART) 0.5 MG capsule   Oral   Take 0.5 mg by mouth every morning.          . ferrous sulfate 325 (65 FE) MG tablet   Oral   Take 325 mg by mouth every other day.          . insulin glargine (LANTUS) 100 UNIT/ML injection   Subcutaneous   Inject 11-12 Units into the skin at bedtime.          . insulin lispro (HUMALOG) 100 UNIT/ML injection   Subcutaneous   Inject 2-9  Units into the skin 3 (three) times daily before meals. 9 units with breakfast, 2-3 units at lunch & 9 units after supper         . metoprolol tartrate (LOPRESSOR) 25 MG tablet   Oral   Take 25 mg by mouth daily.         . Multiple Vitamin (MULTIVITAMIN WITH MINERALS) TABS   Oral   Take 1 tablet by mouth daily. Diabetic pak by Petra Kuba from [employer]         . Polyethyl Glycol-Propyl Glycol (SYSTANE) 0.4-0.3 % SOLN   Ophthalmic   Apply 1 drop to eye daily. Into affected eyes         . polyethylene glycol (MIRALAX / GLYCOLAX) packet   Oral   Take 17 g by mouth daily as needed. For constipation         . Polyvinyl Alcohol-Povidone (REFRESH OP)   Both Eyes   Place 1 drop into both eyes 2 (two) times daily. Refresh Gel Opth         . potassium chloride SA (K-DUR,KLOR-CON) 20 MEQ tablet   Oral   Take 20 mEq by mouth daily.         . silodosin (RAPAFLO) 8 MG CAPS capsule   Oral   Take 8 mg by mouth daily after supper.          . Tafluprost (ZIOPTAN) 0.0015 % SOLN   Both Eyes   Place 1 drop into both eyes daily. Use at night         . torsemide (DEMADEX) 20 MG tablet      Take 61m in the am and 638min the pm   360 tablet   6    There were no vitals taken for this visit. Physical Exam  Nursing note and vitals reviewed. Constitutional: He is oriented to person, place, and time. He appears well-developed and well-nourished. No distress.  HENT:  Head: Normocephalic and atraumatic.  Mouth/Throat: Oropharynx is clear and moist.  Eyes: Pupils are equal, round, and reactive to light.  Neck: Normal range of motion. Neck supple.  Cardiovascular: Normal rate, regular rhythm and normal heart sounds.   Pulmonary/Chest: He is in respiratory distress. He has rales.  Musculoskeletal: He exhibits edema.  Neurological: He is alert and oriented to person, place, and time. He exhibits normal muscle tone. Coordination normal.  Skin: Skin is warm and dry. No rash noted. No  erythema.    ED Course  Procedures (including critical care time) Labs Review Labs Reviewed  CBC  PRO  B NATRIURETIC PEPTIDE  TROPONIN I  BASIC METABOLIC PANEL   Imaging Review Dg Chest Portable 1 View  08/03/2013   CLINICAL DATA:  Productive cough.  Shortness of breath.  EXAM: PORTABLE CHEST - 1 VIEW  COMPARISON:  Chest x-ray 06/20/2013.  FINDINGS: Extensive interstitial prominence and patchy bibasilar airspace disease, concerning for bronchitis and probable multilobar bronchopneumonia. Small bilateral pleural effusions. No definite cephalization of the pulmonary vasculature. Heart size is within normal limits. Upper mediastinal contours are unremarkable. Left-sided pacemaker device in position with lead tips projecting over the expected location of the right atrium and right ventricular apex.  IMPRESSION: 1. The appearance of the chest is concerning for bronchitis and multilobar bronchopneumonia, most pronounced throughout the lung bases bilaterally. 2. Small bilateral pleural effusions.   Electronically Signed   By: Vinnie Langton M.D.   On: 08/03/2013 06:41    Patient be admitted to the hospital by cardiology.  Patient is feeling better after the nitroglycerin and Lasix.  Patient, still short of breath, and tachypneic.  All questions were answered.  The patient Voices an understanding      Brent General, [place] 08/03/13 1539

## 2013-08-03 NOTE — Significant Event (Signed)
Rapid Response Event Note  Overview:  Called to assist with patient with sudden onset SOB and increased WOB  Time Called: 5701 Arrival Time: 1708 Event Type: Respiratory  Initial Focused Assessment:  On arrival [name] at the bedside - patient in resp distress with audible wet souding resps. Bil BS present with RH and wheezing also.   RT at bedside with BiPap machine - RR 42 - HR 65 pacing - BP 201/ 87.  O2 sats 91% on 2. 5 liter nasal cannula.    Patient alert but unable to speak.  Foley being placed by RN staff on my arrival.  [name] states IV Lasix has been given.    Interventions: placed on Bipap by RT - NTG increased to 10 mcg from 45mcg per order [name].  Patient reassured and updated on all treatments.  Son and daughter at bedside.  Update and support.  Patient WOB decreasing with Biapap - moderate amount clear yellow urine thru foley.  [name] in to reasses.  BP 194/78 HR 65 RR 28 with Bipap.  Additional 40mg  Lasix IV given per order [name].  1730:  Patient resting on Bipap - marked decrease in WOB - BP 150/67 O2 sats 100% - patient more alert -able to speak in sentences - no distress.  Continue to diurese.  Transferred to 2H14 via bed with monitor BiPap and NTG drip.  Handoff to RN staff.  Questions answered.     Event Summary: Name of Physician Notified: [name] at 1700    at    Outcome: Transferred (Comment) [phone])  Event End Time: [place]

## 2013-08-03 NOTE — Progress Notes (Signed)
1730 report given to [place] ,ccu Rn. To ccu by bed. Pt breathing easier on Bipap . Nitro drip at 10 mcq/min as ordered.Foley cath with  About 200 cc urine after lasix 80 mgs ivp as .rdered

## 2013-08-03 NOTE — H&P (Addendum)
Admit date: 08/03/2013 Referring Physician [facility] Primary Cardiologist Dr. Cheral Almas [name] III Chief complaint/reason for admission:  cough  HPI: 80 year old man with a known history of diastolic heart failure. He has had a cold over the past couple of days. Early this morning, he noted a persistent cough when he would try to lie flat. Sitting up did make this better. He initially thought he had sinus congestion. He did not feel like his prior heart failure. However, since he was unable to lie flat, he came to the emergency room. He has been having some sputum production. He denies any fevers or chills.  Upon evaluation in the emergency room, his chest x-ray was found to be abnormal compared to prior. Bilateral pleural effusions. There is a pattern worrisome for bronchitis or pneumonia. He never became hypoxemic. He received IV Lasix. He has not noticed a significant difference in his cough.    PMH:    Past Medical History  Diagnosis Date  . Diabetes mellitus   . Hyperlipidemia   . Coronary artery disease   . Spinal stenosis   . Pneumonia   . Pacemaker   . Anemia   . Carotid artery disease   . PVD (peripheral vascular disease)   . Hyponatremia     Chronic  . CVA (cerebral infarction)   . CKD (chronic kidney disease) stage 3, GFR 30-59 ml/min   . Long term (current) use of anticoagulants     No bleeding on Eliquis  . Dysrhythmia   . PSVT (paroxysmal supraventricular tachycardia)   . PAF (paroxysmal atrial fibrillation)     Asymptomatic. On Eliquis.  . AV block, 3rd degree     With DDD St. Jude PM, initially placed in 1993 by Dr. Nils Pyle. Device upgrade 10/2002 to DDD, by Dr. Rollene Fare complicated by bleeding.  . Essential hypertension, benign   . CHF (congestive heart failure)   . Chronic diastolic congestive heart failure     ECHO 05/20/12 LVEF estimated by 2D at 55-60%  . Arthritis     PSH:    Past Surgical History  Procedure Laterality Date  . Lumbar laminectomy   1995  . Back surgery    . Total knee arthroplasty Left   . Quadriceps tendon repair    . Cataract extraction    . Carotid endarterectomy    . Yag laser application Right 1/32/4401    Procedure: YAG LASER CAPSULOTOMY OF RIGHT EYE;  Surgeon: [name], MD;  Location: [facility];  Service: Ophthalmology;  Laterality: Right;  . Tonsillectomy    . Insert / replace / remove pacemaker      DDD St. Jude PM, initially placed in 1993 by Dr. Nils Pyle. Device upgrade 10/2002 to DDD, by Dr. Rollene Fare complicated by bleeding.  . Carotid endarterectomy Right 2006  . Transurethral resection of prostate    . Cardioversion N/A 06/16/2013    Procedure: CARDIOVERSION BEDSIDE;  Surgeon: [name], MD;  Location: [facility] OR;  Service: Cardiovascular;  Laterality: N/A;    ALLERGIES:   Aggrenox; Carvedilol; Claritin; Mucinex; Plavix; Rapaflo; Statins; and Travatan z  Prior to Admit Meds:   (Not in a hospital admission) Family HX:    Family History  Problem Relation Age of Onset  . Multiple myeloma Father   . Hypertension Mother    Social HX:    History   Social History  . Marital Status: Widowed    Spouse Name: N/A    Number of Children:  7  . Years of Education: N/A   Occupational History  . MD    Social History Main Topics  . Smoking status: Never Smoker   . Smokeless tobacco: Never Used  . Alcohol Use: No     Comment: Cocktails occasionally  . Drug Use: No  . Sexual Activity: Not Currently   Other Topics Concern  . Not on file   Social History Narrative  . No narrative on file     ROS:  All 11 ROS were addressed and are negative except what is stated in the HPI  PHYSICAL EXAM Filed Vitals:   08/03/13 0745  BP: 122/65  Pulse: 60  Temp:   Resp: 16   General: Well developed, well nourished, in no acute distress Head:   Normal cephalic and atramatic  Lungs:  Crackles at both bases Heart:   HRRR S1 S2  No JVD.   Abdomen: Mildly obese, nontender Msk:   Normal strength  and tone for age. Extremities:  No edema.   Neuro: Alert  Psych:  Normal affect, responds appropriately   Labs:   Lab Results  Component Value Date   WBC 8.1 08/03/2013   HGB 12.2* 08/03/2013   HCT 36.6* 08/03/2013   MCV 98.1 08/03/2013   PLT 190 08/03/2013    Recent Labs Lab 08/03/13 0629  NA 138  K 3.8  CL 94*  CO2 27  BUN 40*  CREATININE 2.16*  CALCIUM 9.5  GLUCOSE 203*   Lab Results  Component Value Date   CKTOTAL 202 07/15/2013   CKMB 3.3 05/26/2011   TROPONINI <0.30 08/03/2013   No results found for this basename: PTT   Lab Results  Component Value Date   INR 1.01 08/21/2012   INR 0.98 08/18/2012   INR 1.00 02/29/2012     No results found for this basename: CHOL   No results found for this basename: HDL   No results found for this basename: LDLCALC   No results found for this basename: TRIG   No results found for this basename: CHOLHDL   No results found for this basename: LDLDIRECT      Radiology:  '@[id number]' @  EKG:  Normal sinus rhythm, prolonged PR, poor R wave progression, IVCD  ASSESSMENT: Acute on chronic diastolic heart failure, upper respiratory infection  PLAN:  1) diuresed with IV Lasix.  His BNP is Lower than it was in March but higher than it was last July. His chest x-ray now shows bilateral pleural effusions. He is unable to lie flat without coughing. It's unclear how much of this is heart failure versus upper respiratory infection.  Diuresis limited by renal insufficiency.  2) possibility of pneumonia raised by the x-ray. He has not had any fevers. His white count is normal. I would hold off on antibiotics at this time. He may improve just with diuresis.  Prior echocardiogram reveals ejection fraction of 45-50% in February 2015.  IV nitroglycerin for blood pressure control. He denies any symptoms of ischemia.  Diabetes: Sliding scale insulin.  Eliquis for anticoagulation, PAFib.  Jettie Booze., MD  08/03/2013  8:37 AM

## 2013-08-03 NOTE — Progress Notes (Signed)
Placed pt on BIPAP per MD order.  Pt being transported to unit.  Tolerated well.

## 2013-08-03 NOTE — Progress Notes (Signed)
1230 report from [place] from ED to 3E 23 . Pt fully awake alert and oriented and appropriate no cp , no sob with occasional coughing with whitish phlegm . With nitro drip at 1.5 mcg /min as ordered

## 2013-08-03 NOTE — Progress Notes (Signed)
1600 referred pt for coughing and nausea .[name] called back with orders.

## 2013-08-03 NOTE — ED Notes (Addendum)
Pt reports increased cough that started this morning around 2am. Pt reports he has CHF. Denies CP. Airway clear, pt speaking in clear complete sentences,

## 2013-08-04 ENCOUNTER — Observation Stay (HOSPITAL_COMMUNITY): Payer: Medicare Other

## 2013-08-04 DIAGNOSIS — J810 Acute pulmonary edema: Secondary | ICD-10-CM

## 2013-08-04 DIAGNOSIS — I509 Heart failure, unspecified: Secondary | ICD-10-CM

## 2013-08-04 LAB — BASIC METABOLIC PANEL
BUN: 38 mg/dL — ABNORMAL HIGH (ref 6–23)
CO2: 31 mEq/L (ref 19–32)
Calcium: 8.8 mg/dL (ref 8.4–10.5)
Chloride: 96 mEq/L (ref 96–112)
Creatinine, Ser: 2.04 mg/dL — ABNORMAL HIGH (ref 0.50–1.35)
GFR calc Af Amer: 31 mL/min — ABNORMAL LOW (ref >90)
GFR calc non Af Amer: 26 mL/min — ABNORMAL LOW (ref >90)
Glucose, Bld: 196 mg/dL — ABNORMAL HIGH (ref 70–99)
Potassium: 4.1 mEq/L (ref 3.7–5.3)
Sodium: 139 mEq/L (ref 137–147)

## 2013-08-04 LAB — LEGIONELLA ANTIGEN, URINE: LEGIONELLA ANTIGEN, URINE: NEGATIVE

## 2013-08-04 LAB — GLUCOSE, CAPILLARY
GLUCOSE-CAPILLARY: 269 mg/dL — AB (ref 70–99)
Glucose-Capillary: 166 mg/dL — ABNORMAL HIGH (ref 70–99)
Glucose-Capillary: 180 mg/dL — ABNORMAL HIGH (ref 70–99)
Glucose-Capillary: 132 mg/dL — ABNORMAL HIGH (ref 70–99)

## 2013-08-04 MED ORDER — FUROSEMIDE 10 MG/ML IJ SOLN
80.0000 mg | Freq: Once | INTRAMUSCULAR | Status: AC
  Administered 2013-08-04: 80 mg via INTRAVENOUS

## 2013-08-04 MED ORDER — ACETAMINOPHEN 160 MG/5ML PO SOLN: 650.0000 mg | Freq: Four times a day (QID) | ORAL | Status: DC | PRN

## 2013-08-04 MED ORDER — HYDRALAZINE HCL 20 MG/ML IJ SOLN
20.0000 mg | INTRAMUSCULAR | Status: DC | PRN
  Filled 2013-08-04 (×2): qty 1

## 2013-08-04 MED ORDER — FUROSEMIDE 10 MG/ML IJ SOLN
INTRAMUSCULAR | Status: AC
  Filled 2013-08-04: qty 8

## 2013-08-04 MED ORDER — METOLAZONE 5 MG PO TABS
5.0000 mg | ORAL_TABLET | Freq: Every day | ORAL | Status: AC
  Administered 2013-08-04: 5 mg via ORAL
  Filled 2013-08-04 (×3): qty 1

## 2013-08-04 MED ORDER — MORPHINE SULFATE 2 MG/ML IJ SOLN
INTRAMUSCULAR | Status: AC
  Filled 2013-08-04: qty 1

## 2013-08-04 MED ORDER — ACETAMINOPHEN 325 MG PO TABS
650.0000 mg | ORAL_TABLET | Freq: Four times a day (QID) | ORAL | Status: DC | PRN
  Administered 2013-08-04 – 2013-08-05 (×2): 650 mg via ORAL
  Filled 2013-08-04 (×2): qty 2

## 2013-08-04 MED ORDER — ALBUTEROL SULFATE (2.5 MG/3ML) 0.083% IN NEBU
INHALATION_SOLUTION | RESPIRATORY_TRACT | Status: AC
  Filled 2013-08-04: qty 3

## 2013-08-04 MED ORDER — FUROSEMIDE 10 MG/ML IJ SOLN
5.0000 mg/h | INTRAVENOUS | Status: DC
  Administered 2013-08-04: 5 mg/h via INTRAVENOUS
  Filled 2013-08-04: qty 25

## 2013-08-04 MED ORDER — ALBUTEROL SULFATE (2.5 MG/3ML) 0.083% IN NEBU
2.5000 mg | INHALATION_SOLUTION | RESPIRATORY_TRACT | Status: DC | PRN
  Administered 2013-08-04 – 2013-08-08 (×4): 2.5 mg via RESPIRATORY_TRACT
  Filled 2013-08-04 (×3): qty 3

## 2013-08-04 MED ORDER — MORPHINE SULFATE 2 MG/ML IJ SOLN
2.0000 mg | INTRAMUSCULAR | Status: DC | PRN
  Administered 2013-08-04 – 2013-08-05 (×3): 2 mg via INTRAVENOUS
  Filled 2013-08-04 (×2): qty 1

## 2013-08-04 NOTE — Progress Notes (Signed)
Rehab Admissions Coordinator Note:  Patient was screened by Cleatrice Burke for appropriateness for an Inpatient Acute Rehab Consult per PT recommendation.  At this time, we are recommending Inpatient Rehab consult. Please order if you feel appropriate.  Cleatrice Burke 08/04/2013, 4:13 PM  I can be reached at [phone].

## 2013-08-04 NOTE — Procedures (Signed)
Central Venous Catheter Insertion Procedure Note [name] [id number] 08-14-1918  Procedure: Insertion of Central Venous Catheter Indications: Assessment of intravascular volume and Drug and/or fluid administration  Procedure Details Consent: Risks of procedure as well as the alternatives and risks of each were explained to the (patient/caregiver).  Consent for procedure obtained. Time Out: Verified patient identification, verified procedure, site/side was marked, verified correct patient position, special equipment/implants available, medications/allergies/relevent history reviewed, required imaging and test results available.  Performed  Maximum sterile technique was used including antiseptics, cap, gloves, gown, hand hygiene, mask and sheet. Skin prep: Chlorhexidine; local anesthetic administered A antimicrobial bonded/coated triple lumen catheter was placed in the left internal jugular vein using the Seldinger technique.  Evaluation Blood flow good Complications: No apparent complications Patient did tolerate procedure well. Chest X-ray ordered to verify placement.  CXR: pending.  U/S used in placement.  Shaka Zech 08/04/2013, 12:58 PM

## 2013-08-04 NOTE — Evaluation (Signed)
Physical Therapy Evaluation Patient Details Name: [name] MRN: [id number] DOB: Jun 08, 1918 Today's Date: 08/04/2013   History of Present Illness  Pt admit with CHF.    Clinical Impression  Pt admitted with above. Pt currently with functional limitations due to the deficits listed below (see PT Problem List). May need increased rehab prior to d/c home.   Pt will benefit from skilled PT to increase their independence and safety with mobility to allow discharge to the venue listed below.     Follow Up Recommendations CIR;Supervision/Assistance - 24 hour    Equipment Recommendations  Other (comment) (TBA)    Recommendations for Other Services       Precautions / Restrictions Precautions Precautions: Fall Restrictions Weight Bearing Restrictions: No      Mobility  Bed Mobility Overal bed mobility: Needs Assistance;+2 for physical assistance Bed Mobility: Supine to Sit     Supine to sit: Mod assist;+2 for physical assistance     General bed mobility comments: Needed assist for elevation of trunk.  Transfers Overall transfer level: Needs assistance Equipment used: 2 person hand held assist Transfers: Sit to/from [facility] Sit to Stand: Min assist;+2 physical assistance Stand pivot transfers: Min assist;+2 physical assistance       General transfer comment: Pt took pivotal steps to recliner with slightly unsteady gait.  Needs steadying assist for safety. Did not push pt as pt with breathing issues earlier and on 6L O2.    Ambulation/Gait                Stairs            Wheelchair Mobility    Modified Rankin (Stroke Patients Only)       Balance Overall balance assessment: Needs assistance;History of Falls Sitting-balance support: No upper extremity supported;Feet supported Sitting balance-Leahy Scale: Fair   Postural control: Posterior lean Standing balance support: Bilateral upper extremity supported;During functional  activity Standing balance-Leahy Scale: Poor Standing balance comment: Pt with slight posterior lean and knee instability noted.                       Pertinent Vitals/Pain VSS with pt on 6LO2, no pain      Home Living Family/patient expects to be discharged to:: Private residence Living Arrangements: Children;Non-relatives/Friends Available Help at Discharge: Family;Friend(s);Available 24 hours/day Type of Home: House Home Access: Stairs to enter Entrance Stairs-Rails: Can reach both;Left;Right Entrance Stairs-Number of Steps: 3 Home Layout: One level Home Equipment: Walker - 4 wheels;Shower seat      Prior Function Level of Independence: Independent with assistive device(s)               Hand Dominance        Extremity/Trunk Assessment   Upper Extremity Assessment: Defer to OT evaluation           Lower Extremity Assessment: Generalized weakness      Cervical / Trunk Assessment: Kyphotic  Communication   Communication: HOH  Cognition Arousal/Alertness: Awake/alert Behavior During Therapy: WFL for tasks assessed/performed Overall Cognitive Status: Within Functional Limits for tasks assessed                      General Comments      Exercises General Exercises - Lower Extremity Ankle Circles/Pumps: AROM;Both;10 reps;Seated Long Arc Quad: AROM;Both;10 reps;Seated Hip Flexion/Marching: AROM;Both;15 reps;Seated      Assessment/Plan    PT Assessment Patient needs continued PT services  PT Diagnosis Generalized weakness  PT Problem List Decreased activity tolerance;Decreased balance;Decreased mobility;Decreased knowledge of use of DME;Decreased safety awareness;Decreased knowledge of precautions;Cardiopulmonary status limiting activity  PT Treatment Interventions DME instruction;Gait training;Functional mobility training;Therapeutic activities;Therapeutic exercise;Balance training;Patient/family education   PT Goals (Current goals  can be found in the Care Plan section) Acute Rehab PT Goals Patient Stated Goal: to go home PT Goal Formulation: With patient Time For Goal Achievement: 08/11/13 Potential to Achieve Goals: Good    Frequency Min 3X/week   Barriers to discharge        End of Session Equipment Utilized During Treatment: Gait belt;Oxygen Activity Tolerance: Patient limited by fatigue Patient left: in chair;with call bell/phone within reach         Time: 1412-1432 PT Time Calculation (min): 20 min   Charges:   PT Evaluation $Initial PT Evaluation Tier I: 1 Procedure PT Treatments $Therapeutic Activity: 8-22 mins   PT G Codes:          INGOLD,[name] Aug 31, 2013, 4:04 PM [facility] Acute Rehabilitation [phone] (pager)

## 2013-08-04 NOTE — Progress Notes (Signed)
Pt called out saying he could not breath on 4L [place]. 02 was put up to 6 L and pt was pulled up and sat up in bed. Pt called out again saying he could not breath. Lungs were wheezy. Respiratory was called to place pt back on BiPAP. Pt called out after being placed on BiPAP saying he could not breath and was starting to become anxious and trying to get out of bed to sit up. MD was paged. Pt stated "You are trying to kill me while you wait for the doctor." MD at bedside and placed new orders. Pt is calm and comfortable now. Will continue to monitor.

## 2013-08-04 NOTE — ED Provider Notes (Signed)
Medical screening examination/treatment/procedure(s) were conducted as a shared visit with non-physician practitioner(s) and myself.  I personally evaluated the patient during the encounter.  Presents with difficulty breathing, recent cough and history of CHF. On exam has bilateral rales, cough and tachypnea. Afebrile.    Chest x-ray with pulmonary edema also possible pneumonia. Treated for CHF with IV Lasix. Also given IV antibiotics. Plan admit.    EKG Interpretation   Date/Time:  Monday August 03 2013 06:00:19 EDT Ventricular Rate:  88 PR Interval:  61 QRS Duration: 129 QT Interval:  414 QTC Calculation: 501 R Axis:   -79 Text Interpretation:  Sinus rhythm Short PR interval Nonspecific IVCD with  LAD Anterior infarct, old Baseline wander in lead(s) I II III aVL aVF No  significant change since last tracing Confirmed by Kila Godina  MD, Haylin Camilli  [phone]) on 08/03/2013 6:32:00 AM       Teressa Lower, MD 08/04/13 1505

## 2013-08-04 NOTE — Progress Notes (Signed)
Name: [name] MRN: [id number] DOB: 07-Jan-1919    ADMISSION DATE:  08/03/2013 CONSULTATION DATE:  08/03/13  REFERRING MD :  [name] PRIMARY SERVICE:  Cardiology   CHIEF COMPLAINT:  Respiratory Distress   BRIEF PATIENT DESCRIPTION: 80 y/o M admitted with worsening cough, chest tightness, shortness of breath.  Work up noted bilateral pleural effusions.  Pt decompensated evening of admission with hypertensive episode which responded to lasix, NTG & bipap.  PCCM consulted for pulmonary evaluation.   SIGNIFICANT EVENTS / STUDIES:  3/23 - admit with worsening cough, chest tightness, shortness of breath. Decompensated w hypertensive episode pm of admit, responded to lasix, NTG & bipap  LINES / TUBES:   CULTURES: BCx2 3/23>>>  ANTIBIOTICS: Rocephin 3/23 (empiric CAP)>>> Azithro 3/23 (empiric CAP)>>>  SUBJECTIVE: Flash pulmonary edema overnight requiring BiPAP.  VITAL SIGNS: Temp:  [98 F (36.7 C)-101.2 F (38.4 C)] 99 F (37.2 C) (03/24 1155) Pulse Rate:  [59-101] 59 (03/24 1200) Resp:  [15-39] 20 (03/24 1200) BP: (123-209)/(37-100) 128/61 mmHg (03/24 1200) SpO2:  [90 %-100 %] 99 % (03/24 1200) FiO2 (%):  [0 %-40 %] 40 % (03/24 0400) Weight:  [192 lb 3.9 oz (87.2 kg)-197 lb 5 oz (89.5 kg)] 193 lb 5.5 oz (87.7 kg) (03/24 0423)  PHYSICAL EXAMINATION: General:  wdwn elderly male in respiratory distress Neuro:  AAOx4, communicates appropriately, brief answers as is on bipap HEENT:  Mm pink/moist Cardiovascular:  s1s2 regular, paced rhythm, no gallop or rub Lungs:  resp's even/non-labored on bipap, lungs bilaterally with few wheezes, crackles lower  Abdomen:  Obese, soft, bsx4 active  Musculoskeletal:  No acute deformities Skin:  Warm/dry, trace edema   Recent Labs Lab 08/03/13 0629 08/04/13 0320  NA 138 139  K 3.8 4.1  CL 94* 96  CO2 27 31  BUN 40* 38*  CREATININE 2.16* 2.04*  GLUCOSE 203* 196*    Recent Labs Lab 08/03/13 0629  HGB 12.2*  HCT 36.6*   WBC 8.1  PLT 190   Dg Chest Portable 1 View  08/03/2013   CLINICAL DATA:  Productive cough.  Shortness of breath.  EXAM: PORTABLE CHEST - 1 VIEW  COMPARISON:  Chest x-ray 06/20/2013.  FINDINGS: Extensive interstitial prominence and patchy bibasilar airspace disease, concerning for bronchitis and probable multilobar bronchopneumonia. Small bilateral pleural effusions. No definite cephalization of the pulmonary vasculature. Heart size is within normal limits. Upper mediastinal contours are unremarkable. Left-sided pacemaker device in position with lead tips projecting over the expected location of the right atrium and right ventricular apex.  IMPRESSION: 1. The appearance of the chest is concerning for bronchitis and multilobar bronchopneumonia, most pronounced throughout the lung bases bilaterally. 2. Small bilateral pleural effusions.   Electronically Signed   By: Vinnie Langton M.D.   On: 08/03/2013 06:41    ASSESSMENT / PLAN:  Acute Respiratory Failure - multifactorial in setting of decompensated CHF, acute hypertensive episode, +/- CAP.  Significant improvement in clinical status with bipap, nitro & lasix.  Reported never smoker.  Hypoxemia - secondary to above  Plan: - Continue BiPAP as needed for increase WOB. - Place central line to follow CVP. - Lasix drip as ordered. - Titrate oxygen for sats > 93% - Empiric abx as above - NTG per Cardiology   - F/u cxr and BMET in AM. - Monitor fever curve / leukocytosis - Pulmonary hygiene   CC time 40 minutes.  Rush Farmer, M.D. [facility] Pulmonary/Critical Care Medicine. Pager: [phone]. After hours pager: [phone].

## 2013-08-04 NOTE — Progress Notes (Signed)
Patient Name: [name] Date of Encounter: 08/04/2013  Active Problems:   Acute diastolic heart failure   Length of Stay: 1  SUBJECTIVE  Appears to be in extreme respiratory distress. Appears critically ill. RR> 40 despite BiPAP Rales up to clavicles bilaterally. No meaningful diuresis overnight. BP 199/100 on IV NTG at 10 mcg/min  CURRENT MEDS . allopurinol  100 mg Oral QHS  . amiodarone  200 mg Oral Daily  . apixaban  2.5 mg Oral BID  . azithromycin  500 mg Intravenous Q24H  . cefTRIAXone (ROCEPHIN)  IV  1 g Intravenous Q24H  . dutasteride  0.5 mg Oral Daily  . ferrous sulfate  325 mg Oral QODAY  . furosemide  40 mg Intravenous BID  . furosemide  80 mg Intravenous Once  . insulin aspart  0-15 Units Subcutaneous TID WC  . insulin glargine  11 Units Subcutaneous QHS  . latanoprost  1 drop Both Eyes QHS  . metoprolol tartrate  25 mg Oral Daily  . morphine      . multivitamin with minerals  1 tablet Oral Daily  . polyvinyl alcohol  1 drop Both Eyes Daily  . potassium chloride SA  20 mEq Oral Daily  . sodium chloride  3 mL Intravenous Q12H  . tamsulosin  0.4 mg Oral Daily    OBJECTIVE   Intake/Output Summary (Last 24 hours) at 08/04/13 0908 Last data filed at 08/04/13 0800  Gross per 24 hour  Intake    759 ml  Output    857 ml  Net    -98 ml   Filed Weights   08/03/13 1255 08/03/13 1800 08/04/13 0423  Weight: 89.5 kg (197 lb 5 oz) 87.2 kg (192 lb 3.9 oz) 87.7 kg (193 lb 5.5 oz)    PHYSICAL EXAM Filed Vitals:   08/04/13 0700 08/04/13 0755 08/04/13 0800 08/04/13 0830  BP: 139/51  161/72   Pulse: 63  80 101  Temp:  99.6 F (37.6 C)    TempSrc:  Oral    Resp: 25  23 34  Height:      Weight:      SpO2: 97%  100% 96%   General: Alert, oriented x3, severe distress, but coherent  Head: no evidence of trauma, PERRL, EOMI, no exophtalmos or lid lag, no myxedema, no xanthelasma; normal ears, nose and oropharynx Neck: normal jugular venous pulsations and no  hepatojugular reflux; brisk carotid pulses without delay and no carotid bruits Chest: bilateral moist rales all the way up Cardiovascular: hard to define the apical impulse, regular rhythm with ectopy, normal first and paradoxically split second heart sounds, hard to hear due to rales Abdomen: no tenderness or distention, no masses by palpation, no abnormal pulsatility or arterial bruits, normal bowel sounds, no hepatosplenomegaly Extremities: no clubbing, cyanosis or edema; 2+ radial, ulnar and brachial pulses bilaterally; 2+ right femoral, posterior tibial and dorsalis pedis pulses; 2+ left femoral, posterior tibial and dorsalis pedis pulses; no subclavian or femoral bruits Neurological: grossly nonfocal  LABS  CBC  Recent Labs  08/03/13 0629  WBC 8.1  HGB 12.2*  HCT 36.6*  MCV 98.1  PLT 010   Basic Metabolic Panel  Recent Labs  08/03/13 0629 08/04/13 0320  NA 138 139  K 3.8 4.1  CL 94* 96  CO2 27 31  GLUCOSE 203* 196*  BUN 40* 38*  CREATININE 2.16* 2.04*  CALCIUM 9.5 8.8   Cardiac Enzymes  Recent Labs  08/03/13 0629  TROPONINI <0.30  Radiology Studies Imaging results have been reviewed and Dg Chest Portable 1 View  08/03/2013   CLINICAL DATA:  Productive cough.  Shortness of breath.  EXAM: PORTABLE CHEST - 1 VIEW  COMPARISON:  Chest x-ray 06/20/2013.  FINDINGS: Extensive interstitial prominence and patchy bibasilar airspace disease, concerning for bronchitis and probable multilobar bronchopneumonia. Small bilateral pleural effusions. No definite cephalization of the pulmonary vasculature. Heart size is within normal limits. Upper mediastinal contours are unremarkable. Left-sided pacemaker device in position with lead tips projecting over the expected location of the right atrium and right ventricular apex.  IMPRESSION: 1. The appearance of the chest is concerning for bronchitis and multilobar bronchopneumonia, most pronounced throughout the lung bases bilaterally. 2.  Small bilateral pleural effusions.   Electronically Signed   By: Vinnie Langton M.D.   On: 08/03/2013 06:41    TELE 100% V paced. Appears to be SR with PACs, CHB, V paced  ECG Appears to be SR with PACs, CHB, V paced  ASSESSMENT AND PLAN Acute pulmonary edema with impending respiratory failure. By most recent assessment, predominantly diastolic dysfunction (EF 33%) Increase diuretic dose. Aggressively lower BP with vasodilators. Morphine low dose. Interrogated device to see if atrial fibrillation is playing any role in his decompensation. No AF since February.  He wants endotracheal intubation if necessary. He is "full code". Will try to avoid this if possible.   Sanda Klein, MD, [facility] [phone] office [phone] pager 08/04/2013 9:08 AM

## 2013-08-04 NOTE — Progress Notes (Signed)
eLink Physician-Brief Progress Note Patient Name: [name] DOB: May 24, 1918 MRN: [id number]  Date of Service  08/04/2013   HPI/Events of Note   hungry  eICU Interventions  Give food   Intervention Category Minor Interventions: Other:  [name] 08/04/2013, 5:40 PM

## 2013-08-04 NOTE — Progress Notes (Signed)
UR completed. Patient changed to inpatient- requiring IV lasix gtt and IV NTG gtt.

## 2013-08-05 ENCOUNTER — Inpatient Hospital Stay (HOSPITAL_COMMUNITY): Payer: Medicare Other

## 2013-08-05 DIAGNOSIS — I4891 Unspecified atrial fibrillation: Secondary | ICD-10-CM

## 2013-08-05 DIAGNOSIS — N179 Acute kidney failure, unspecified: Secondary | ICD-10-CM | POA: Diagnosis not present

## 2013-08-05 DIAGNOSIS — Z950 Presence of cardiac pacemaker: Secondary | ICD-10-CM

## 2013-08-05 DIAGNOSIS — N189 Chronic kidney disease, unspecified: Secondary | ICD-10-CM

## 2013-08-05 LAB — BASIC METABOLIC PANEL
BUN: 44 mg/dL — ABNORMAL HIGH (ref 6–23)
CHLORIDE: 94 meq/L — AB (ref 96–112)
CO2: 33 mEq/L — ABNORMAL HIGH (ref 19–32)
Calcium: 8.7 mg/dL (ref 8.4–10.5)
Creatinine, Ser: 2.34 mg/dL — ABNORMAL HIGH (ref 0.50–1.35)
GFR calc Af Amer: 26 mL/min — ABNORMAL LOW (ref >90)
GFR calc non Af Amer: 22 mL/min — ABNORMAL LOW (ref >90)
Glucose, Bld: 147 mg/dL — ABNORMAL HIGH (ref 70–99)
Potassium: 3.4 mEq/L — ABNORMAL LOW (ref 3.7–5.3)
Sodium: 138 mEq/L (ref 137–147)

## 2013-08-05 LAB — CBC
HEMATOCRIT: 28.4 % — AB (ref 39.0–52.0)
Hemoglobin: 9.6 g/dL — ABNORMAL LOW (ref 13.0–17.0)
MCH: 33.3 pg (ref 26.0–34.0)
MCHC: 33.8 g/dL (ref 30.0–36.0)
MCV: 98.6 fL (ref 78.0–100.0)
Platelets: 132 10*3/uL — ABNORMAL LOW (ref 150–400)
RBC: 2.88 MIL/uL — ABNORMAL LOW (ref 4.22–5.81)
RDW: 14.9 % (ref 11.5–15.5)
WBC: 4.6 10*3/uL (ref 4.0–10.5)

## 2013-08-05 LAB — GLUCOSE, CAPILLARY
GLUCOSE-CAPILLARY: 289 mg/dL — AB (ref 70–99)
GLUCOSE-CAPILLARY: 152 mg/dL — AB (ref 70–99)
Glucose-Capillary: 139 mg/dL — ABNORMAL HIGH (ref 70–99)
Glucose-Capillary: 166 mg/dL — ABNORMAL HIGH (ref 70–99)

## 2013-08-05 LAB — PHOSPHORUS: Phosphorus: 3.9 mg/dL (ref 2.3–4.6)

## 2013-08-05 LAB — MAGNESIUM: Magnesium: 2.3 mg/dL (ref 1.5–2.5)

## 2013-08-05 MED ORDER — POTASSIUM CHLORIDE CRYS ER 20 MEQ PO TBCR
40.0000 meq | EXTENDED_RELEASE_TABLET | Freq: Once | ORAL | Status: AC
  Administered 2013-08-05: 40 meq via ORAL

## 2013-08-05 MED ORDER — HYDRALAZINE HCL 25 MG PO TABS
25.0000 mg | ORAL_TABLET | Freq: Three times a day (TID) | ORAL | Status: DC
  Administered 2013-08-05 – 2013-08-14 (×26): 25 mg via ORAL
  Filled 2013-08-05 (×31): qty 1

## 2013-08-05 MED ORDER — NITROGLYCERIN IN D5W 200-5 MCG/ML-% IV SOLN: 2.0000 ug/min | INTRAVENOUS | Status: DC

## 2013-08-05 MED ORDER — POLYETHYLENE GLYCOL 3350 17 G PO PACK
17.0000 g | PACK | Freq: Every day | ORAL | Status: DC
  Administered 2013-08-05 – 2013-08-13 (×9): 17 g via ORAL
  Filled 2013-08-05 (×9): qty 1

## 2013-08-05 MED ORDER — ISOSORBIDE DINITRATE 20 MG PO TABS
20.0000 mg | ORAL_TABLET | Freq: Two times a day (BID) | ORAL | Status: DC
  Administered 2013-08-05 – 2013-08-13 (×18): 20 mg via ORAL
  Filled 2013-08-05 (×20): qty 1

## 2013-08-05 MED ORDER — FUROSEMIDE 10 MG/ML IJ SOLN
8.0000 mg/h | INTRAVENOUS | Status: AC
  Administered 2013-08-05: 8 mg/h via INTRAVENOUS
  Filled 2013-08-05 (×2): qty 25

## 2013-08-05 MED ORDER — METOLAZONE 5 MG PO TABS
5.0000 mg | ORAL_TABLET | Freq: Every day | ORAL | Status: AC
  Administered 2013-08-05: 5 mg via ORAL
  Filled 2013-08-05: qty 1

## 2013-08-05 MED ORDER — DEXTROMETHORPHAN POLISTIREX 30 MG/5ML PO LQCR
30.0000 mg | Freq: Four times a day (QID) | ORAL | Status: DC | PRN
  Administered 2013-08-05 – 2013-08-07 (×3): 30 mg via ORAL
  Filled 2013-08-05 (×3): qty 5

## 2013-08-05 NOTE — Progress Notes (Signed)
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 Agree with all treatment measures. He is on amiodarone and I wonder if that could be playing a role. He needs to maintain sinus rhythm and therapy has done a good job over the past 2 months that he has been on it. Still not putting out much urine.

## 2013-08-05 NOTE — Progress Notes (Signed)
Name: [name] MRN: [id number] DOB: Jul 10, 1918    ADMISSION DATE:  08/03/2013 CONSULTATION DATE:  08/03/13  REFERRING MD :  [name] PRIMARY SERVICE:  Cardiology   CHIEF COMPLAINT:  Respiratory Distress   BRIEF PATIENT DESCRIPTION: 80 y/o M admitted with worsening cough, chest tightness, shortness of breath.  Work up noted bilateral pleural effusions.  Pt decompensated evening of admission with hypertensive episode which responded to lasix, NTG & bipap.  PCCM consulted for pulmonary evaluation.   SIGNIFICANT EVENTS / STUDIES:  3/23 - admit with worsening cough, chest tightness, shortness of breath. Decompensated w hypertensive episode pm of admit, responded to lasix, NTG & bipap  LINES / TUBES:   CULTURES: BCx2 3/23>>>  ANTIBIOTICS: Rocephin 3/23 (empiric CAP)>>> Azithro 3/23 (empiric CAP)>>>  SUBJECTIVE: Flash pulmonary edema overnight requiring BiPAP.  VITAL SIGNS: Temp:  [98.1 F (36.7 C)-100 F (37.8 C)] 98.1 F (36.7 C) (03/25 0744) Pulse Rate:  [59-70] 64 (03/25 0900) Resp:  [12-25] 18 (03/25 0900) BP: (104-145)/(40-61) 120/50 mmHg (03/25 0900) SpO2:  [91 %-100 %] 95 % (03/25 0900) Weight:  [197 lb 1.5 oz (89.4 kg)] 197 lb 1.5 oz (89.4 kg) (03/25 0500)  PHYSICAL EXAMINATION: General:  wdwn elderly male in no respiratory distress Neuro:  AAOx4, communicates appropriately HEENT:  Mm pink/moist Cardiovascular:  s1s2 regular, paced rhythm, no gallop or rub Lungs: Diffuse crackles Abdomen:  Obese, soft, bsx4 active  Musculoskeletal:  No acute deformities Skin:  Warm/dry, trace edema  Recent Labs Lab 08/03/13 0629 08/04/13 0320 08/05/13 0500  NA 138 139 138  K 3.8 4.1 3.4*  CL 94* 96 94*  CO2 27 31 33*  BUN 40* 38* 44*  CREATININE 2.16* 2.04* 2.34*  GLUCOSE 203* 196* 147*   Recent Labs Lab 08/03/13 0629 08/05/13 0500  HGB 12.2* 9.6*  HCT 36.6* 28.4*  WBC 8.1 4.6  PLT 190 132*   Dg Chest Port 1 View  08/05/2013   CLINICAL DATA:  Shortness  of breath, assess endotracheal tube placement  EXAM: PORTABLE CHEST - 1 VIEW  COMPARISON:  DG CHEST 1V PORT dated 08/04/2013  FINDINGS: An endotracheal tube if present is not visible on this study. The lungs are adequately inflated. The interstitial markings are more prominent bilaterally. Increased density at the left lung base with obscuration of the hemidiaphragm persists. The cardiopericardial silhouette is top-normal in size. The pulmonary vascularity is mildly prominent centrally though stable. There is no pneumothorax or pneumomediastinum. The right internal jugular venous catheter tip lies in the region of the midportion of the SVC and is unchanged. The permanent pacemaker appears unchanged in position.  IMPRESSION: 1. An endotracheal tube, if present, is not visible on this study. 2. The lungs remain well expanded but the pulmonary interstitial markings have increased further bilaterally. This likely reflects interstitial edema which may be of cardiac or noncardiac cause.   Electronically Signed   By: David  Martinique   On: 08/05/2013 08:41   Dg Chest Port 1 View  08/04/2013   CLINICAL DATA:  Status post central line placement  EXAM: PORTABLE CHEST - 1 VIEW  COMPARISON:  DG CHEST 1V PORT dated 08/03/2013  FINDINGS: The patient has undergone placement of a right internal jugular venous catheter. The tip lies in the region of the junction of proximal and midportions of the SVC. There is no evidence for postprocedure pneumothorax or pneumothorax.  The lungs are adequately inflated. The interstitial markings are prominent at both lung bases but overall have improved  since yesterday's study. The cardiac silhouette is normal in size. The pulmonary vascularity is less engorged today. The permanent pacemaker is unchanged in appearance.  IMPRESSION: 1. There is no evidence of a postprocedure complication following placement of a right internal jugular venous catheter. 2. There has been mild interval improvement in the  appearance of the pulmonary interstitium since yesterday's study. Bibasilar atelectasis persists.   Electronically Signed   By: David  Martinique   On: 08/04/2013 13:25   ASSESSMENT / PLAN:  Acute Respiratory Failure - multifactorial in setting of decompensated CHF, acute hypertensive episode, +/- CAP.  Significant improvement in clinical status with bipap, nitro & lasix.  Reported never smoker.  Hypoxemia - secondary to above  Plan: - Discontinue BiPAP. - Follow CVP 10 cmH2O today. - Increase lasix drip to 8 mg/hr. - Titrate oxygen for sats > 93% - Empiric abx as above, doubt pneumonia but will finish course. - NTG per Cardiology. - F/u cxr and BMET in AM. - Monitor fever curve / leukocytosis - Pulmonary hygiene   Rush Farmer, M.D. [facility] Pulmonary/Critical Care Medicine. Pager: [phone]. After hours pager: [phone].

## 2013-08-05 NOTE — Progress Notes (Signed)
Physical Therapy Treatment Patient Details Name: [name] MRN: [id number] DOB: 11-Aug-1918 Today's Date: 08/05/2013    History of Present Illness Pt admit with CHF.      PT Comments    Pt admitted with above. Pt currently with functional limitations due to balance and endurance deficits.  Pt will benefit from skilled PT to increase their independence and safety with mobility to allow discharge to the venue listed below.   Follow Up Recommendations  CIR;Supervision/Assistance - 24 hour     Equipment Recommendations  Other (comment) (TBA)    Recommendations for Other Services       Precautions / Restrictions Precautions Precautions: Fall Restrictions Weight Bearing Restrictions: No    Mobility  Bed Mobility                  Transfers Overall transfer level: Needs assistance Equipment used: Rolling walker (2 wheeled) Transfers: Sit to/from [facility] Sit to Stand: Min assist;+2 physical assistance Stand pivot transfers: Min assist;+2 physical assistance       General transfer comment: Pt took pivotal steps to 3N1 with slightly unsteady gait and need for incr time.  Needs steadying assist for safety. Pt still needing 3LO2.      Ambulation/Gait Ambulation/Gait assistance: Mod assist;Min assist;+2 physical assistance Ambulation Distance (Feet): 95 Feet Assistive device: Rolling walker (2 wheeled) Gait Pattern/deviations: Step-to pattern;Decreased stride length;Wide base of support;Trunk flexed   Gait velocity interpretation: Below normal speed for age/gender General Gait Details: Pt relies on RW.  As pt fatigues, he flexes trunk more and more.  Pt needed cues for upright posture as well as for sequencing steps and rW.  Also needed cues to regulate pursed lip breathing and cannot ambulate on RA at present as he dropped to 74% on RA but with 3LO2, sats 93 % and >.     Stairs            Wheelchair Mobility    Modified Rankin (Stroke  Patients Only)       Balance Overall balance assessment: Needs assistance;History of Falls Sitting-balance support: No upper extremity supported;Feet supported Sitting balance-Leahy Scale: Fair   Postural control: Posterior lean Standing balance support: Bilateral upper extremity supported;During functional activity Standing balance-Leahy Scale: Poor Standing balance comment: Pt with posterior lean and bil knee instability.                     Cognition Arousal/Alertness: Awake/alert Behavior During Therapy: WFL for tasks assessed/performed Overall Cognitive Status: Within Functional Limits for tasks assessed                      Exercises General Exercises - Lower Extremity Ankle Circles/Pumps: AROM;Both;10 reps;Seated Long Arc Quad: AROM;Both;10 reps;Seated Hip Flexion/Marching: AROM;Both;15 reps;Seated    General Comments        Pertinent Vitals/Pain Desat on RA to 74% with ambulation.  Replaced 3LO2 with sat >93%.  Other VSS, no pain.    Home Living                      Prior Function            PT Goals (current goals can now be found in the care plan section) Progress towards PT goals: Progressing toward goals    Frequency  Min 3X/week    PT Plan Current plan remains appropriate    End of Session Equipment Utilized During Treatment: Gait belt;Oxygen Activity Tolerance: Patient limited  by fatigue Patient left: in chair;with call bell/phone within reach     Time: 1319-1345 PT Time Calculation (min): 26 min  Charges:  $Gait Training: 8-22 mins $Therapeutic Exercise: 8-22 mins                    G Codes:      INGOLD,Bernis Stecher Aug 15, 2013, 3:29 PM [facility] [phone] (pager)

## 2013-08-05 NOTE — Progress Notes (Signed)
Patient Name: [name] Date of Encounter: 08/05/2013  Active Problems:   Acute diastolic heart failure   Length of Stay: 2  SUBJECTIVE  Substantial improvement in signs and symptoms of acute heart failure, primarily via BP reduction - net diuresis has been mediocre. Slight worsening of creatinine, not far from baseline. Productive "wet" cough  CURRENT MEDS . allopurinol  100 mg Oral QHS  . amiodarone  200 mg Oral Daily  . apixaban  2.5 mg Oral BID  . azithromycin  500 mg Intravenous Q24H  . cefTRIAXone (ROCEPHIN)  IV  1 g Intravenous Q24H  . dutasteride  0.5 mg Oral Daily  . ferrous sulfate  325 mg Oral QODAY  . hydrALAZINE  25 mg Oral 3 times per day  . insulin aspart  0-15 Units Subcutaneous TID WC  . insulin glargine  11 Units Subcutaneous QHS  . isosorbide dinitrate  20 mg Oral BID  . latanoprost  1 drop Both Eyes QHS  . metolazone  5 mg Oral Daily  . metoprolol tartrate  25 mg Oral Daily  . multivitamin with minerals  1 tablet Oral Daily  . polyethylene glycol  17 g Oral Daily  . polyvinyl alcohol  1 drop Both Eyes Daily  . sodium chloride  3 mL Intravenous Q12H  . tamsulosin  0.4 mg Oral Daily    OBJECTIVE   Intake/Output Summary (Last 24 hours) at 08/05/13 1118 Last data filed at 08/05/13 0900  Gross per 24 hour  Intake 1267.5 ml  Output   2075 ml  Net -807.5 ml   Filed Weights   08/03/13 1800 08/04/13 0423 08/05/13 0500  Weight: 87.2 kg (192 lb 3.9 oz) 87.7 kg (193 lb 5.5 oz) 89.4 kg (197 lb 1.5 oz)    PHYSICAL EXAM Filed Vitals:   08/05/13 0700 08/05/13 0744 08/05/13 0800 08/05/13 0900  BP: 143/60  145/58 120/50  Pulse:  64 61 64  Temp:  98.1 F (36.7 C)    TempSrc:  Oral    Resp:  15 19 18   Height:      Weight:      SpO2:  100% 97% 95%   General: Alert, oriented x3, no distress Head: no evidence of trauma, PERRL, EOMI, no exophtalmos or lid lag, no myxedema, no xanthelasma; normal ears, nose and oropharynx Neck: normal jugular venous  pulsations and no hepatojugular reflux; brisk carotid pulses without delay and no carotid bruits Chest: rales in right base, no signs of consolidation by percussion or palpation, normal fremitus, symmetrical and full respiratory excursions Cardiovascular: normal position and quality of the apical impulse, regular rhythm, normal first and paradoxically split second heart sounds, no rubs or gallops, no murmur Abdomen: no tenderness or distention, no masses by palpation, no abnormal pulsatility or arterial bruits, normal bowel sounds, no hepatosplenomegaly Extremities: no clubbing, cyanosis or edema; 2+ radial, ulnar and brachial pulses bilaterally; 2+ right femoral, posterior tibial and dorsalis pedis pulses; 2+ left femoral, posterior tibial and dorsalis pedis pulses; no subclavian or femoral bruits Neurological: grossly nonfocal  LABS  CBC  Recent Labs  08/03/13 0629 08/05/13 0500  WBC 8.1 4.6  HGB 12.2* 9.6*  HCT 36.6* 28.4*  MCV 98.1 98.6  PLT 190 631*   Basic Metabolic Panel  Recent Labs  08/04/13 0320 08/05/13 0500  NA 139 138  K 4.1 3.4*  CL 96 94*  CO2 31 33*  GLUCOSE 196* 147*  BUN 38* 44*  CREATININE 2.04* 2.34*  CALCIUM 8.8 8.7  MG  --  2.3  PHOS  --  3.9   Cardiac Enzymes  Recent Labs  08/03/13 0629  TROPONINI <0.30    Radiology Studies Imaging results have been reviewed and Dg Chest [facility] 1 View  08/05/2013   CLINICAL DATA:  Shortness of breath, assess endotracheal tube placement  EXAM: PORTABLE CHEST - 1 VIEW  COMPARISON:  DG CHEST 1V PORT dated 08/04/2013  FINDINGS: An endotracheal tube if present is not visible on this study. The lungs are adequately inflated. The interstitial markings are more prominent bilaterally. Increased density at the left lung base with obscuration of the hemidiaphragm persists. The cardiopericardial silhouette is top-normal in size. The pulmonary vascularity is mildly prominent centrally though stable. There is no pneumothorax or  pneumomediastinum. The right internal jugular venous catheter tip lies in the region of the midportion of the SVC and is unchanged. The permanent pacemaker appears unchanged in position.  IMPRESSION: 1. An endotracheal tube, if present, is not visible on this study. 2. The lungs remain well expanded but the pulmonary interstitial markings have increased further bilaterally. This likely reflects interstitial edema which may be of cardiac or noncardiac cause.   Electronically Signed   By: David  Martin[place]   On: 08/05/2013 08:41   Dg Chest Port 1 View  08/04/2013   CLINICAL DATA:  Status post central line placement  EXAM: PORTABLE CHEST - 1 VIEW  COMPARISON:  DG CHEST 1V PORT dated 08/03/2013  FINDINGS: The patient has undergone placement of a right internal jugular venous catheter. The tip lies in the region of the junction of proximal and midportions of the SVC. There is no evidence for postprocedure pneumothorax or pneumothorax.  The lungs are adequately inflated. The interstitial markings are prominent at both lung bases but overall have improved since yesterday's study. The cardiac silhouette is normal in size. The pulmonary vascularity is less engorged today. The permanent pacemaker is unchanged in appearance.  IMPRESSION: 1. There is no evidence of a postprocedure complication following placement of a right internal jugular venous catheter. 2. There has been mild interval improvement in the appearance of the pulmonary interstitium since yesterday's study. Bibasilar atelectasis persists.   Electronically Signed   By: David  Martin[place]   On: 08/04/2013 13:25    TELE ApVp - no AF   ASSESSMENT AND PLAN   Acute pulmonary edema -markedly improved, almost resolved. Transition from IV to PO vasodilators. ACEi/ARB contraindicated due to acute on chronic renal failure. Cautious monitoring of renal function - most of the problem may have been excessive afterload, not only hypervolemia. Unexplained drop in  hemoglobin without overt bleeding. May need to stop apixaban if trend continues.   Sanda Klein, MD, [facility] [phone] office [phone] pager 08/05/2013 11:18 AM

## 2013-08-06 DIAGNOSIS — N179 Acute kidney failure, unspecified: Secondary | ICD-10-CM

## 2013-08-06 DIAGNOSIS — N189 Chronic kidney disease, unspecified: Secondary | ICD-10-CM

## 2013-08-06 LAB — BASIC METABOLIC PANEL
BUN: 44 mg/dL — ABNORMAL HIGH (ref 6–23)
CALCIUM: 8.8 mg/dL (ref 8.4–10.5)
CO2: 35 mEq/L — ABNORMAL HIGH (ref 19–32)
Chloride: 91 mEq/L — ABNORMAL LOW (ref 96–112)
Creatinine, Ser: 2.19 mg/dL — ABNORMAL HIGH (ref 0.50–1.35)
GFR, EST AFRICAN AMERICAN: 28 mL/min — AB (ref >90)
GFR, EST NON AFRICAN AMERICAN: 24 mL/min — AB (ref >90)
Glucose, Bld: 151 mg/dL — ABNORMAL HIGH (ref 70–99)
POTASSIUM: 3.4 meq/L — AB (ref 3.7–5.3)
SODIUM: 137 meq/L (ref 137–147)

## 2013-08-06 LAB — CBC
HCT: 29.6 % — ABNORMAL LOW (ref 39.0–52.0)
HEMOGLOBIN: 10 g/dL — AB (ref 13.0–17.0)
MCH: 32.8 pg (ref 26.0–34.0)
MCHC: 33.8 g/dL (ref 30.0–36.0)
MCV: 97 fL (ref 78.0–100.0)
Platelets: 154 10*3/uL (ref 150–400)
RBC: 3.05 MIL/uL — AB (ref 4.22–5.81)
RDW: 14.5 % (ref 11.5–15.5)
WBC: 5.1 10*3/uL (ref 4.0–10.5)

## 2013-08-06 LAB — PHOSPHORUS: PHOSPHORUS: 3.5 mg/dL (ref 2.3–4.6)

## 2013-08-06 LAB — GLUCOSE, CAPILLARY
GLUCOSE-CAPILLARY: 150 mg/dL — AB (ref 70–99)
GLUCOSE-CAPILLARY: 274 mg/dL — AB (ref 70–99)
Glucose-Capillary: 226 mg/dL — ABNORMAL HIGH (ref 70–99)
Glucose-Capillary: 112 mg/dL — ABNORMAL HIGH (ref 70–99)

## 2013-08-06 LAB — MAGNESIUM: Magnesium: 2.2 mg/dL (ref 1.5–2.5)

## 2013-08-06 MED ORDER — FUROSEMIDE 10 MG/ML IJ SOLN
8.0000 mg/h | INTRAVENOUS | Status: DC
  Filled 2013-08-06: qty 25

## 2013-08-06 MED ORDER — MAGNESIUM HYDROXIDE 400 MG/5ML PO SUSP
30.0000 mL | Freq: Once | ORAL | Status: AC
  Administered 2013-08-06: 30 mL via ORAL
  Filled 2013-08-06: qty 30

## 2013-08-06 MED ORDER — GI COCKTAIL ~~LOC~~
30.0000 mL | Freq: Two times a day (BID) | ORAL | Status: DC | PRN
  Administered 2013-08-06: 30 mL via ORAL
  Filled 2013-08-06 (×3): qty 30

## 2013-08-06 MED ORDER — FLEET ENEMA 7-19 GM/118ML RE ENEM
1.0000 | ENEMA | Freq: Once | RECTAL | Status: AC
  Administered 2013-08-06: 1 via RECTAL
  Filled 2013-08-06: qty 1

## 2013-08-06 MED ORDER — POTASSIUM CHLORIDE CRYS ER 20 MEQ PO TBCR
40.0000 meq | EXTENDED_RELEASE_TABLET | Freq: Three times a day (TID) | ORAL | Status: AC
  Administered 2013-08-06 (×2): 40 meq via ORAL
  Filled 2013-08-06 (×2): qty 2

## 2013-08-06 MED ORDER — METOLAZONE 5 MG PO TABS
5.0000 mg | ORAL_TABLET | Freq: Every day | ORAL | Status: AC
  Administered 2013-08-06: 5 mg via ORAL
  Filled 2013-08-06: qty 1

## 2013-08-06 NOTE — Progress Notes (Signed)
Patient Name: [name] Date of Encounter: 08/06/2013  Active Problems:   Acute diastolic heart failure   Acute on chronic renal failure   Length of Stay: 3  SUBJECTIVE  Much better Productive cough - thick sputum persists, but less frequent No dyspnea lying flat on room air No atrial fibrillation, 100% paced  CURRENT MEDS . allopurinol  100 mg Oral QHS  . amiodarone  200 mg Oral Daily  . apixaban  2.5 mg Oral BID  . azithromycin  500 mg Intravenous Q24H  . cefTRIAXone (ROCEPHIN)  IV  1 g Intravenous Q24H  . dutasteride  0.5 mg Oral Daily  . ferrous sulfate  325 mg Oral QODAY  . hydrALAZINE  25 mg Oral 3 times per day  . insulin aspart  0-15 Units Subcutaneous TID WC  . insulin glargine  11 Units Subcutaneous QHS  . isosorbide dinitrate  20 mg Oral BID  . latanoprost  1 drop Both Eyes QHS  . metolazone  5 mg Oral Daily  . metoprolol tartrate  25 mg Oral Daily  . multivitamin with minerals  1 tablet Oral Daily  . polyethylene glycol  17 g Oral Daily  . polyvinyl alcohol  1 drop Both Eyes Daily  . potassium chloride  40 mEq Oral TID  . sodium chloride  3 mL Intravenous Q12H  . sodium phosphate  1 enema Rectal Once  . tamsulosin  0.4 mg Oral Daily    OBJECTIVE   Intake/Output Summary (Last 24 hours) at 08/06/13 1025 Last data filed at 08/06/13 0900  Gross per 24 hour  Intake 1303.5 ml  Output   3500 ml  Net -2196.5 ml   Filed Weights   08/04/13 0423 08/05/13 0500 08/06/13 0448  Weight: 87.7 kg (193 lb 5.5 oz) 89.4 kg (197 lb 1.5 oz) 87.5 kg (192 lb 14.4 oz)    PHYSICAL EXAM Filed Vitals:   08/06/13 0600 08/06/13 0700 08/06/13 0800 08/06/13 0900  BP: 121/45  133/50   Pulse: 68 66 62 64  Temp:   98.4 F (36.9 C)   TempSrc:   Oral   Resp: 13 16 19 23   Height:      Weight:      SpO2: 100% 100% 100% 95%   General: Alert, oriented x3, no distress Head: no evidence of trauma, PERRL, EOMI, no exophtalmos or lid lag, no myxedema, no xanthelasma; normal  ears, nose and oropharynx Neck: normal jugular venous pulsations and no hepatojugular reflux; brisk carotid pulses without delay and no carotid bruits Chest: clear to auscultation, no signs of consolidation by percussion or palpation, normal fremitus, symmetrical and full respiratory excursions Cardiovascular: normal position and quality of the apical impulse, regular rhythm, normal first and second heart sounds, no rubs or gallops, no murmur Abdomen: no tenderness or distention, no masses by palpation, no abnormal pulsatility or arterial bruits, normal bowel sounds, no hepatosplenomegaly Extremities: no clubbing, cyanosis or edema; 2+ radial, ulnar and brachial pulses bilaterally; 2+ right femoral, posterior tibial and dorsalis pedis pulses; 2+ left femoral, posterior tibial and dorsalis pedis pulses; no subclavian or femoral bruits Neurological: grossly nonfocal  LABS  CBC  Recent Labs  08/05/13 0500 08/06/13 0450  WBC 4.6 5.1  HGB 9.6* 10.0*  HCT 28.4* 29.6*  MCV 98.6 97.0  PLT 132* 409   Basic Metabolic Panel  Recent Labs  08/05/13 0500 08/06/13 0450  NA 138 137  K 3.4* 3.4*  CL 94* 91*  CO2 33* 35*  GLUCOSE 147*  151*  BUN 44* 44*  CREATININE 2.34* 2.19*  CALCIUM 8.7 8.8  MG 2.3 2.2  PHOS 3.9 3.5   Liver Function Tests No results found for this basename: AST, ALT, ALKPHOS, BILITOT, PROT, ALBUMIN,  in the last 72 hours No results found for this basename: LIPASE, AMYLASE,  in the last 72 hours Cardiac Enzymes No results found for this basename: CKTOTAL, CKMB, CKMBINDEX, TROPONINI,  in the last 72 hours  Radiology Studies Imaging results have been reviewed and Dg Chest Port 1 View  08/05/2013   CLINICAL DATA:  Shortness of breath, assess endotracheal tube placement  EXAM: PORTABLE CHEST - 1 VIEW  COMPARISON:  DG CHEST 1V PORT dated 08/04/2013  FINDINGS: An endotracheal tube if present is not visible on this study. The lungs are adequately inflated. The interstitial  markings are more prominent bilaterally. Increased density at the left lung base with obscuration of the hemidiaphragm persists. The cardiopericardial silhouette is top-normal in size. The pulmonary vascularity is mildly prominent centrally though stable. There is no pneumothorax or pneumomediastinum. The right internal jugular venous catheter tip lies in the region of the midportion of the SVC and is unchanged. The permanent pacemaker appears unchanged in position.  IMPRESSION: 1. An endotracheal tube, if present, is not visible on this study. 2. The lungs remain well expanded but the pulmonary interstitial markings have increased further bilaterally. This likely reflects interstitial edema which may be of cardiac or noncardiac cause.   Electronically Signed   By: David  Martinique   On: 08/05/2013 08:41   Dg Chest Port 1 View  08/04/2013   CLINICAL DATA:  Status post central line placement  EXAM: PORTABLE CHEST - 1 VIEW  COMPARISON:  DG CHEST 1V PORT dated 08/03/2013  FINDINGS: The patient has undergone placement of a right internal jugular venous catheter. The tip lies in the region of the junction of proximal and midportions of the SVC. There is no evidence for postprocedure pneumothorax or pneumothorax.  The lungs are adequately inflated. The interstitial markings are prominent at both lung bases but overall have improved since yesterday's study. The cardiac silhouette is normal in size. The pulmonary vascularity is less engorged today. The permanent pacemaker is unchanged in appearance.  IMPRESSION: 1. There is no evidence of a postprocedure complication following placement of a right internal jugular venous catheter. 2. There has been mild interval improvement in the appearance of the pulmonary interstitium since yesterday's study. Bibasilar atelectasis persists.   Electronically Signed   By: David  Martinique   On: 08/04/2013 13:25    TELE Apaced Vpaced   ASSESSMENT AND PLAN   I think he is ready to  transfer to the floor. Hemodynamically stable on hydralazine-nitrates, off O2 Change to po diuretics. Probably stop metolazone after today. Finish antibiotic course per PCCM rec.   Sanda Klein, MD, [facility] [phone] office [phone] pager 08/06/2013 10:25 AM

## 2013-08-06 NOTE — Progress Notes (Signed)
Name: [name] MRN: [id number] DOB: [birth year]    ADMISSION DATE:  08/03/2013 CONSULTATION DATE:  08/03/13  REFERRING MD :  [name] PRIMARY SERVICE:  Cardiology   CHIEF COMPLAINT:  Respiratory Distress   BRIEF PATIENT DESCRIPTION: 80 y/o M admitted with worsening cough, chest tightness, shortness of breath.  Work up noted bilateral pleural effusions.  Pt decompensated evening of admission with hypertensive episode which responded to lasix, NTG & bipap.  PCCM consulted for pulmonary evaluation.   SIGNIFICANT EVENTS / STUDIES:  3/23 - admit with worsening cough, chest tightness, shortness of breath. Decompensated w hypertensive episode pm of admit, responded to lasix, NTG & bipap  LINES / TUBES:   CULTURES: BCx2 3/23>>>  ANTIBIOTICS: Rocephin 3/23 (empiric CAP)>>>3/30 Azithro 3/23 (empiric CAP)>>>3/27  SUBJECTIVE: Flash pulmonary edema overnight requiring BiPAP.  VITAL SIGNS: Temp:  [97.8 F (36.6 C)-98.6 F (37 C)] 98.5 F (36.9 C) (03/26 0400) Pulse Rate:  [63-71] 66 (03/26 0700) Resp:  [10-23] 16 (03/26 0700) BP: (101-155)/(33-65) 121/45 mmHg (03/26 0600) SpO2:  [95 %-100 %] 100 % (03/26 0700) Weight:  [192 lb 14.4 oz (87.5 kg)] 192 lb 14.4 oz (87.5 kg) (03/26 0448)  PHYSICAL EXAMINATION: General:  wdwn elderly male in no respiratory distress Neuro:  AAOx4, communicates appropriately HEENT:  Mm pink/moist Cardiovascular:  s1s2 regular, paced rhythm, no gallop or rub Lungs: Diffuse crackles Abdomen:  Obese, soft, bsx4 active  Musculoskeletal:  No acute deformities Skin:  Warm/dry, trace edema  Recent Labs Lab 08/04/13 0320 08/05/13 0500 08/06/13 0450  NA 139 138 137  K 4.1 3.4* 3.4*  CL 96 94* 91*  CO2 31 33* 35*  BUN 38* 44* 44*  CREATININE 2.04* 2.34* 2.19*  GLUCOSE 196* 147* 151*    Recent Labs Lab 08/03/13 0629 08/05/13 0500 08/06/13 0450  HGB 12.2* 9.6* 10.0*  HCT 36.6* 28.4* 29.6*  WBC 8.1 4.6 5.1  PLT 190 132* 154   Dg Chest  Port 1 View  08/05/2013   CLINICAL DATA:  Shortness of breath, assess endotracheal tube placement  EXAM: PORTABLE CHEST - 1 VIEW  COMPARISON:  DG CHEST 1V PORT dated 08/04/2013  FINDINGS: An endotracheal tube if present is not visible on this study. The lungs are adequately inflated. The interstitial markings are more prominent bilaterally. Increased density at the left lung base with obscuration of the hemidiaphragm persists. The cardiopericardial silhouette is top-normal in size. The pulmonary vascularity is mildly prominent centrally though stable. There is no pneumothorax or pneumomediastinum. The right internal jugular venous catheter tip lies in the region of the midportion of the SVC and is unchanged. The permanent pacemaker appears unchanged in position.  IMPRESSION: 1. An endotracheal tube, if present, is not visible on this study. 2. The lungs remain well expanded but the pulmonary interstitial markings have increased further bilaterally. This likely reflects interstitial edema which may be of cardiac or noncardiac cause.   Electronically Signed   By: David  Martinique   On: 08/05/2013 08:41   Dg Chest Port 1 View  08/04/2013   CLINICAL DATA:  Status post central line placement  EXAM: PORTABLE CHEST - 1 VIEW  COMPARISON:  DG CHEST 1V PORT dated 08/03/2013  FINDINGS: The patient has undergone placement of a right internal jugular venous catheter. The tip lies in the region of the junction of proximal and midportions of the SVC. There is no evidence for postprocedure pneumothorax or pneumothorax.  The lungs are adequately inflated. The interstitial markings are prominent at  both lung bases but overall have improved since yesterday's study. The cardiac silhouette is normal in size. The pulmonary vascularity is less engorged today. The permanent pacemaker is unchanged in appearance.  IMPRESSION: 1. There is no evidence of a postprocedure complication following placement of a right internal jugular venous  catheter. 2. There has been mild interval improvement in the appearance of the pulmonary interstitium since yesterday's study. Bibasilar atelectasis persists.   Electronically Signed   By: David  Martinique   On: 08/04/2013 13:25   ASSESSMENT / PLAN:  Acute Respiratory Failure - multifactorial in setting of decompensated CHF, acute hypertensive episode, +/- CAP.  Significant improvement in clinical status with bipap, nitro & lasix.  Reported never smoker.  Hypoxemia - secondary to above CXR this AM remains consistent with pulmonary edema. Plan: - Discontinued BiPAP and has done well overnight. - Follow CVP. - Increase lasix drip to 8 mg/hr. - Titrate oxygen for sats > 93% - Empiric abx as above, doubt pneumonia but will finish course, zithromax to be stopped after 3/27 dose and rocephin to be stopped after 3/30 dose. - NTG per Cardiology. - F/u cxr and BMET in AM. - Monitor fever curve / leukocytosis - Pulmonary hygiene  - Constipation - add MOM and if unsuccessful then will order fleet enema.  Rush Farmer, M.D. [facility] Pulmonary/Critical Care Medicine. Pager: [phone]. After hours pager: [phone].

## 2013-08-07 ENCOUNTER — Inpatient Hospital Stay (HOSPITAL_COMMUNITY): Payer: Medicare Other

## 2013-08-07 DIAGNOSIS — Z7901 Long term (current) use of anticoagulants: Secondary | ICD-10-CM

## 2013-08-07 DIAGNOSIS — I10 Essential (primary) hypertension: Secondary | ICD-10-CM

## 2013-08-07 DIAGNOSIS — N183 Chronic kidney disease, stage 3 unspecified: Secondary | ICD-10-CM

## 2013-08-07 LAB — GLUCOSE, CAPILLARY
GLUCOSE-CAPILLARY: 330 mg/dL — AB (ref 70–99)
GLUCOSE-CAPILLARY: 240 mg/dL — AB (ref 70–99)
GLUCOSE-CAPILLARY: 200 mg/dL — AB (ref 70–99)
Glucose-Capillary: 158 mg/dL — ABNORMAL HIGH (ref 70–99)
Glucose-Capillary: 148 mg/dL — ABNORMAL HIGH (ref 70–99)

## 2013-08-07 LAB — BASIC METABOLIC PANEL
BUN: 42 mg/dL — ABNORMAL HIGH (ref 6–23)
CO2: 36 meq/L — AB (ref 19–32)
CREATININE: 2.1 mg/dL — AB (ref 0.50–1.35)
Calcium: 8.8 mg/dL (ref 8.4–10.5)
Chloride: 91 mEq/L — ABNORMAL LOW (ref 96–112)
GFR calc Af Amer: 30 mL/min — ABNORMAL LOW (ref >90)
GFR, EST NON AFRICAN AMERICAN: 26 mL/min — AB (ref >90)
GLUCOSE: 175 mg/dL — AB (ref 70–99)
Potassium: 4 mEq/L (ref 3.7–5.3)
SODIUM: 135 meq/L — AB (ref 137–147)

## 2013-08-07 LAB — CBC
HCT: 29.7 % — ABNORMAL LOW (ref 39.0–52.0)
HEMOGLOBIN: 10.2 g/dL — AB (ref 13.0–17.0)
MCH: 33.6 pg (ref 26.0–34.0)
MCHC: 34.3 g/dL (ref 30.0–36.0)
MCV: 97.7 fL (ref 78.0–100.0)
PLATELETS: 148 10*3/uL — AB (ref 150–400)
RBC: 3.04 MIL/uL — AB (ref 4.22–5.81)
RDW: 14.7 % (ref 11.5–15.5)
WBC: 3.6 10*3/uL — ABNORMAL LOW (ref 4.0–10.5)

## 2013-08-07 LAB — MAGNESIUM: Magnesium: 2.4 mg/dL (ref 1.5–2.5)

## 2013-08-07 LAB — PHOSPHORUS: Phosphorus: 2.9 mg/dL (ref 2.3–4.6)

## 2013-08-07 MED ORDER — TORSEMIDE 20 MG PO TABS
60.0000 mg | ORAL_TABLET | Freq: Every day | ORAL | Status: DC
  Administered 2013-08-07 – 2013-08-09 (×3): 60 mg via ORAL
  Filled 2013-08-07 (×4): qty 3

## 2013-08-07 MED ORDER — TORSEMIDE 20 MG PO TABS: 60.0000 mg | ORAL_TABLET | Freq: Every day | ORAL | Status: DC

## 2013-08-07 MED ORDER — TORSEMIDE 20 MG PO TABS
80.0000 mg | ORAL_TABLET | Freq: Two times a day (BID) | ORAL | Status: DC
  Administered 2013-08-07: 80 mg via ORAL
  Filled 2013-08-07 (×3): qty 4

## 2013-08-07 MED ORDER — TORSEMIDE 20 MG PO TABS
80.0000 mg | ORAL_TABLET | Freq: Every morning | ORAL | Status: DC
Start: 2013-08-07 — End: 2013-08-10
  Administered 2013-08-08 – 2013-08-09 (×2): 80 mg via ORAL
  Filled 2013-08-07 (×3): qty 4

## 2013-08-07 NOTE — Progress Notes (Signed)
Inpatient Diabetes Program Recommendations  AACE/ADA: New Consensus Statement on Inpatient Glycemic Control (2013)  Target Ranges:  Prepandial:   less than 140 mg/dL      Peak postprandial:   less than 180 mg/dL (1-2 hours)      Critically ill patients:  140 - 180 mg/dL   Results for [name], [name] (MRN [id number]) as of 08/07/2013 08:57  Ref. Range 08/06/2013 08:27 08/06/2013 12:27 08/06/2013 17:15 08/06/2013 21:15 08/07/2013 07:54  Glucose-Capillary Latest Range: 70-99 mg/dL 150 (H) 226 (H) 112 (H) 274 (H) 158 (H)   Diabetes history: DM2 Outpatient Diabetes medications: Lantus 11-12 units QHS, Humalog 2-9 units TID with meals Current orders for Inpatient glycemic control: Lantus 11 units QHS, Novolog 0-15 units AC  Inpatient Diabetes Program Recommendations Insulin - Meal Coverage: Please consider ordering Novolog 3 units TID with meals for meal coverage as long as patient eats at least 50% of meal.  Note: According to the chart patient at breakfast and supper. Noted postprandial glucose elevated over 225 mg/dl after both meals eaten yesterday. Please consider ordering Novolog 3 units TID with meals for meal coverage.  Thanks, Barnie Alderman, RN, MSN, CCRN Diabetes Coordinator Inpatient Diabetes Program [phone] (Team Pager) [phone] (AP office) [phone] [facility] [place] office)

## 2013-08-07 NOTE — Progress Notes (Signed)
Subjective:  Only complaint is persistent cough - not positional, productive of thick sputum. No dyspnea. Walked around the unit without desaturation. Apaced-Vpaced, no AF  Objective:  Temp:  [98.1 F (36.7 C)-98.7 F (37.1 C)] 98.4 F (36.9 C) (03/27 0755) Pulse Rate:  [59-78] 64 (03/27 1000) Resp:  [12-23] 16 (03/27 1000) BP: (106-148)/(42-65) 123/42 mmHg (03/27 1000) SpO2:  [93 %-100 %] 93 % (03/27 1000) Weight:  [86.8 kg (191 lb 5.8 oz)] 86.8 kg (191 lb 5.8 oz) (03/27 0500) Weight change: -0.7 kg (-1 lb 8.7 oz)  Intake/Output from previous day: 03/26 0701 - 03/27 0700 In: 358 [I.V.:58; IV Piggyback:300] Out: 2500 [Urine:2500]  Intake/Output from this shift: Total I/O In: 200 [P.O.:200] Out: 50 [Urine:50]  Medications: Current Facility-Administered Medications  Medication Dose Route Frequency Provider Last Rate Last Dose  . 0.9 %  sodium chloride infusion  250 mL Intravenous PRN Casandra Doffing, MD 10 mL/hr at 08/06/13 1200 250 mL at 08/06/13 1200  . acetaminophen (TYLENOL) tablet 650 mg  650 mg Oral Q6H PRN Colbert Coyer, MD   650 mg at 08/05/13 0011  . albuterol (PROVENTIL) (2.5 MG/3ML) 0.083% nebulizer solution 2.5 mg  2.5 mg Nebulization Q4H PRN Sanda Klein, MD   2.5 mg at 08/05/13 2215  . allopurinol (ZYLOPRIM) tablet 100 mg  100 mg Oral QHS Casandra Doffing, MD   100 mg at 08/06/13 2113  . amiodarone (PACERONE) tablet 200 mg  200 mg Oral Daily Casandra Doffing, MD   200 mg at 08/07/13 0915  . apixaban (ELIQUIS) tablet 2.5 mg  2.5 mg Oral BID Casandra Doffing, MD   2.5 mg at 08/07/13 0915  . azithromycin (ZITHROMAX) 500 mg in dextrose 5 % 250 mL IVPB  500 mg Intravenous Q24H Belva Crome III, MD   500 mg at 08/06/13 2038  . cefTRIAXone (ROCEPHIN) 1 g in dextrose 5 % 50 mL IVPB  1 g Intravenous Q24H Belva Crome III, MD   1 g at 08/06/13 2153  . dextromethorphan (DELSYM) 30 MG/5ML liquid 30 mg  30 mg Oral Q6H PRN Elsie Stain, MD   30 mg at 08/07/13 0058  .  dutasteride (AVODART) capsule 0.5 mg  0.5 mg Oral Daily Casandra Doffing, MD   0.5 mg at 08/07/13 0916  . ferrous sulfate tablet 325 mg  325 mg Oral QODAY Casandra Doffing, MD   325 mg at 08/07/13 0916  . gi cocktail (Maalox,Lidocaine,Donnatal)  30 mL Oral BID PRN Rush Farmer, MD   30 mL at 08/06/13 1631  . hydrALAZINE (APRESOLINE) injection 20 mg  20 mg Intravenous Q4H PRN Jourden Gilson, MD      . hydrALAZINE (APRESOLINE) tablet 25 mg  25 mg Oral 3 times per day Sanda Klein, MD   25 mg at 08/07/13 2876  . insulin aspart (novoLOG) injection 0-15 Units  0-15 Units Subcutaneous TID WC Casandra Doffing, MD   3 Units at 08/07/13 [phone]  . insulin glargine (LANTUS) injection 11 Units  11 Units Subcutaneous QHS Casandra Doffing, MD   11 Units at 08/06/13 2117  . isosorbide dinitrate (ISORDIL) tablet 20 mg  20 mg Oral BID Sanda Klein, MD   20 mg at 08/07/13 0916  . latanoprost (XALATAN) 0.005 % ophthalmic solution 1 drop  1 drop Both Eyes QHS Casandra Doffing, MD   1 drop at 08/06/13 2119  . metoprolol tartrate (LOPRESSOR) tablet 25 mg  25 mg Oral Daily Casandra Doffing, MD   25 mg  at 08/07/13 0916  . morphine 2 MG/ML injection 2 mg  2 mg Intravenous Q1H PRN Sanda Klein, MD   2 mg at 08/05/13 2259  . multivitamin with minerals tablet 1 tablet  1 tablet Oral Daily Casandra Doffing, MD   1 tablet at 08/07/13 0916  . nitroGLYCERIN 0.2 mg/mL in dextrose 5 % infusion  2-200 mcg/min Intravenous Titrated Swetha Rayle, MD      . ondansetron (ZOFRAN) injection 4 mg  4 mg Intravenous Q6H PRN Tarri Fuller, PA-C   4 mg at 08/06/13 2206  . polyethylene glycol (MIRALAX / GLYCOLAX) packet 17 g  17 g Oral Daily PRN Casandra Doffing, MD   17 g at 08/05/13 2106  . polyethylene glycol (MIRALAX / GLYCOLAX) packet 17 g  17 g Oral Daily Rush Farmer, MD   17 g at 08/07/13 0920  . polyvinyl alcohol (LIQUIFILM TEARS) 1.4 % ophthalmic solution 1 drop  1 drop Both Eyes Daily Casandra Doffing, MD   1 drop at 08/07/13 1000  . sodium chloride 0.9 %  injection 3 mL  3 mL Intravenous Q12H Casandra Doffing, MD   3 mL at 08/07/13 1000  . sodium chloride 0.9 % injection 3 mL  3 mL Intravenous PRN Casandra Doffing, MD      . tamsulosin [facility]) capsule 0.4 mg  0.4 mg Oral Daily Casandra Doffing, MD   0.4 mg at 08/07/13 0915  . torsemide (DEMADEX) tablet 80 mg  80 mg Oral BID [name], MD   80 mg at 08/07/13 0913    Physical Exam: General: Alert, oriented x3, no distress  Head: no evidence of trauma, PERRL, EOMI, no exophtalmos or lid lag, no myxedema, no xanthelasma; normal ears, nose and oropharynx  Neck: normal jugular venous pulsations and no hepatojugular reflux; brisk carotid pulses without delay and no carotid bruits  Chest: rales in right base, no signs of consolidation by percussion or palpation, normal fremitus, symmetrical and full respiratory excursions  Cardiovascular: normal position and quality of the apical impulse, regular rhythm, normal first and paradoxically split second heart sounds, no rubs or gallops, no murmur  Abdomen: no tenderness or distention, no masses by palpation, no abnormal pulsatility or arterial bruits, normal bowel sounds, no hepatosplenomegaly  Extremities: no clubbing, cyanosis or edema; 2+ radial, ulnar and brachial pulses bilaterally; 2+ right femoral, posterior tibial and dorsalis pedis pulses; 2+ left femoral, posterior tibial and dorsalis pedis pulses; no subclavian or femoral bruits  Neurological: grossly nonfocal   Lab Results: Results for orders placed during the hospital encounter of 08/03/13 (from the past 48 hour(s))  GLUCOSE, CAPILLARY     Status: Abnormal   Collection Time    08/05/13 11:27 AM      Result Value Ref Range   Glucose-Capillary 289 (*) 70 - 99 mg/dL  GLUCOSE, CAPILLARY     Status: Abnormal   Collection Time    08/05/13  4:33 PM      Result Value Ref Range   Glucose-Capillary 166 (*) 70 - 99 mg/dL  GLUCOSE, CAPILLARY     Status: Abnormal   Collection Time    08/05/13  9:04 PM        Result Value Ref Range   Glucose-Capillary 152 (*) 70 - 99 mg/dL  BASIC METABOLIC PANEL     Status: Abnormal   Collection Time    08/06/13  4:50 AM      Result Value Ref Range   Sodium 137  137 - 147  mEq/L   Potassium 3.4 (*) 3.7 - 5.3 mEq/L   Chloride 91 (*) 96 - 112 mEq/L   CO2 35 (*) 19 - 32 mEq/L   Glucose, Bld 151 (*) 70 - 99 mg/dL   BUN 44 (*) 6 - 23 mg/dL   Creatinine, Ser 2.19 (*) 0.50 - 1.35 mg/dL   Calcium 8.8  8.4 - 10.5 mg/dL   GFR calc non Af Amer 24 (*) >90 mL/min   GFR calc Af Amer 28 (*) >90 mL/min   Comment: (NOTE)     The eGFR has been calculated using the CKD EPI equation.     This calculation has not been validated in all clinical situations.     eGFR's persistently <90 mL/min signify possible Chronic Kidney     Disease.  CBC     Status: Abnormal   Collection Time    08/06/13  4:50 AM      Result Value Ref Range   WBC 5.1  4.0 - 10.5 K/uL   RBC 3.05 (*) 4.22 - 5.81 MIL/uL   Hemoglobin 10.0 (*) 13.0 - 17.0 g/dL   HCT 29.6 (*) 39.0 - 52.0 %   MCV 97.0  78.0 - 100.0 fL   MCH 32.8  26.0 - 34.0 pg   MCHC 33.8  30.0 - 36.0 g/dL   RDW 14.5  11.5 - 15.5 %   Platelets 154  150 - 400 K/uL  MAGNESIUM     Status: None   Collection Time    08/06/13  4:50 AM      Result Value Ref Range   Magnesium 2.2  1.5 - 2.5 mg/dL  PHOSPHORUS     Status: None   Collection Time    08/06/13  4:50 AM      Result Value Ref Range   Phosphorus 3.5  2.3 - 4.6 mg/dL  GLUCOSE, CAPILLARY     Status: Abnormal   Collection Time    08/06/13  8:27 AM      Result Value Ref Range   Glucose-Capillary 150 (*) 70 - 99 mg/dL  GLUCOSE, CAPILLARY     Status: Abnormal   Collection Time    08/06/13 12:27 PM      Result Value Ref Range   Glucose-Capillary 226 (*) 70 - 99 mg/dL  GLUCOSE, CAPILLARY     Status: Abnormal   Collection Time    08/06/13  5:15 PM      Result Value Ref Range   Glucose-Capillary 112 (*) 70 - 99 mg/dL  GLUCOSE, CAPILLARY     Status: Abnormal   Collection Time     08/06/13  9:15 PM      Result Value Ref Range   Glucose-Capillary 274 (*) 70 - 99 mg/dL  CBC     Status: Abnormal   Collection Time    08/07/13  5:30 AM      Result Value Ref Range   WBC 3.6 (*) 4.0 - 10.5 K/uL   RBC 3.04 (*) 4.22 - 5.81 MIL/uL   Hemoglobin 10.2 (*) 13.0 - 17.0 g/dL   HCT 29.7 (*) 39.0 - 52.0 %   MCV 97.7  78.0 - 100.0 fL   MCH 33.6  26.0 - 34.0 pg   MCHC 34.3  30.0 - 36.0 g/dL   RDW 14.7  11.5 - 15.5 %   Platelets 148 (*) 150 - 400 K/uL  BASIC METABOLIC PANEL     Status: Abnormal   Collection Time    08/07/13  5:30  AM      Result Value Ref Range   Sodium 135 (*) 137 - 147 mEq/L   Potassium 4.0  3.7 - 5.3 mEq/L   Chloride 91 (*) 96 - 112 mEq/L   CO2 36 (*) 19 - 32 mEq/L   Glucose, Bld 175 (*) 70 - 99 mg/dL   BUN 42 (*) 6 - 23 mg/dL   Creatinine, Ser 2.10 (*) 0.50 - 1.35 mg/dL   Calcium 8.8  8.4 - 10.5 mg/dL   GFR calc non Af Amer 26 (*) >90 mL/min   GFR calc Af Amer 30 (*) >90 mL/min   Comment: (NOTE)     The eGFR has been calculated using the CKD EPI equation.     This calculation has not been validated in all clinical situations.     eGFR's persistently <90 mL/min signify possible Chronic Kidney     Disease.  MAGNESIUM     Status: None   Collection Time    08/07/13  5:30 AM      Result Value Ref Range   Magnesium 2.4  1.5 - 2.5 mg/dL  PHOSPHORUS     Status: None   Collection Time    08/07/13  5:30 AM      Result Value Ref Range   Phosphorus 2.9  2.3 - 4.6 mg/dL  GLUCOSE, CAPILLARY     Status: Abnormal   Collection Time    08/07/13  7:54 AM      Result Value Ref Range   Glucose-Capillary 158 (*) 70 - 99 mg/dL    Imaging: Imaging results have been reviewed and Dg Chest Port 1 View  08/07/2013   CLINICAL DATA:  Post bronchoscopy and biopsy.  EXAM: PORTABLE CHEST - 1 VIEW  COMPARISON:  DG CHEST 1V PORT dated 08/05/2013; DG CHEST 1V PORT dated 08/04/2013; DG CHEST 2 VIEW dated 06/20/2013; DG CHEST 1V PORT dated 06/14/2013; CT L SPINE W/O CM dated  04/30/2013; DG CHEST 1V PORT dated 08/03/2013  FINDINGS: Trachea is midline. Right IJ central line tip projects over the SVC. Heart size stable. Left subclavian pacemaker lead tips project over the right atrium and right ventricle.  There is right hilar fullness. Mild diffuse bilateral interstitial prominence and indistinctness. Confluent opacity in the right infrahilar region. No definite pneumothorax. No pleural fluid.  IMPRESSION: 1. No definite pneumothorax after bronchoscopy. 2. Right hilar prominence with right infrahilar opacification. Findings correspond to a reported history of a right lung mass and right hilar adenopathy, when admission history and physical 07/31/2013 is reviewed. 3. Mild diffuse interstitial prominence and indistinctness, possibly due to edema.   Electronically Signed   By: Lorin Picket M.D.   On: 08/07/2013 08:23    Assessment:  1. Active Problems: 2.   Acute diastolic heart failure 3.   Acute on chronic renal failure 4. PAF 5. Dual chamber pacemaker for complete heart block  Plan:  1. OK for telemetry. 2. Continue same dose diuretics, amiodarone, anticoagulation and vasodilators 3. Suspect cough is bronchitic/pneumonia. Note radiologist concerns re: right lung mass - PCCM following, will discuss  Time Spent Directly with Patient:  50 minutes  Length of Stay:  LOS: 4 days    Danilyn Cocke 08/07/2013, 10:59 AM

## 2013-08-07 NOTE — Progress Notes (Signed)
Pt transferred to 2w15; VSS; pt oriented to room; no needs voiced at this time; will cont. To monitor.

## 2013-08-07 NOTE — Progress Notes (Signed)
Physical Therapy Treatment Patient Details Name: [name] MRN: [id number] DOB: [birth year] Today's Date: 08/20/2013    History of Present Illness Pt admit with CHF.      PT Comments    Pt with great activity progression today with decreased desaturation with gait still dropping to 88% on RA with quick recovery to 93% at rest. Pt encouraged to continue mobility, gait and HEP with staff. Will follow.   Follow Up Recommendations  Home health PT;Supervision/Assistance - 24 hour     Equipment Recommendations       Recommendations for Other Services       Precautions / Restrictions Precautions Precautions: Fall    Mobility  Bed Mobility Overal bed mobility: Needs Assistance       Supine to sit: Supervision     General bed mobility comments: cues for sequence and use of rail with increased time  Transfers Overall transfer level: Needs assistance   Transfers: Sit to/from Stand Sit to Stand: Min assist         General transfer comment: cues for hand placement and safety with assist for anterior translation  Ambulation/Gait Ambulation/Gait assistance: Min guard Ambulation Distance (Feet): 160 Feet Assistive device: Rolling walker (2 wheeled) Gait Pattern/deviations: Step-through pattern;Decreased stride length;Decreased dorsiflexion - left;Trunk flexed     General Gait Details: cues for posture and position in RW pt with foot drop left but AFO not resent. Desats to 88% on RA with gait but immediate return to 93% at rest   Stairs            Wheelchair Mobility    Modified Rankin (Stroke Patients Only)       Balance                                    Cognition Arousal/Alertness: Awake/alert Behavior During Therapy: WFL for tasks assessed/performed Overall Cognitive Status: Within Functional Limits for tasks assessed                      Exercises General Exercises - Lower Extremity Long Arc Quad: AROM;Seated;Both;20  reps Hip Flexion/Marching: AROM;Both;Seated;20 reps    General Comments        Pertinent Vitals/Pain Sats 88-96% on RA HR 73 No pain    Home Living                      Prior Function            PT Goals (current goals can now be found in the care plan section) Progress towards PT goals: Progressing toward goals    Frequency  Min 3X/week    PT Plan Discharge plan needs to be updated    End of Session Equipment Utilized During Treatment: Gait belt Activity Tolerance: Patient limited by fatigue Patient left: in chair;with call bell/phone within reach     Time: 0758-0821 PT Time Calculation (min): 23 min  Charges:  $Gait Training: 8-22 mins $Therapeutic Exercise: 8-22 mins                    G Codes:      Melford Aase 2013/08/20, 8:30 AM Elwyn Reach, [place]

## 2013-08-07 NOTE — Progress Notes (Signed)
Name: [name] MRN: [id number] DOB: Dec 08, 1918    ADMISSION DATE:  08/03/2013 CONSULTATION DATE:  08/03/13  REFERRING MD :  [name] PRIMARY SERVICE:  Cardiology   CHIEF COMPLAINT:  Respiratory Distress   BRIEF PATIENT DESCRIPTION: 80 y/o M admitted with worsening cough, chest tightness, shortness of breath.  Work up noted bilateral pleural effusions.  Pt decompensated evening of admission with hypertensive episode which responded to lasix, NTG & bipap.  PCCM consulted for pulmonary evaluation.   SIGNIFICANT EVENTS / STUDIES:  3/23 - admit with worsening cough, chest tightness, shortness of breath. Decompensated w hypertensive episode pm of admit, responded to lasix, NTG & bipap  LINES / TUBES:   CULTURES: BCx2 3/23>>>  ANTIBIOTICS: Rocephin 3/23 (empiric CAP)>>>3/30 Azithro 3/23 (empiric CAP)>>>3/27  SUBJECTIVE: Flash pulmonary edema overnight requiring BiPAP.  VITAL SIGNS: Temp:  [98.1 F (36.7 C)-98.7 F (37.1 C)] 98.4 F (36.9 C) (03/27 0755) Pulse Rate:  [59-78] 64 (03/27 1000) Resp:  [12-23] 16 (03/27 1000) BP: (106-148)/(42-65) 123/42 mmHg (03/27 1000) SpO2:  [93 %-100 %] 93 % (03/27 1000) Weight:  [86.8 kg (191 lb 5.8 oz)] 86.8 kg (191 lb 5.8 oz) (03/27 0500) Room air   PHYSICAL EXAMINATION: General:  wdwn elderly male in no respiratory distress Neuro:  AAOx4, communicates appropriately HEENT:  Mm pink/moist Cardiovascular:  s1s2 regular, paced rhythm, no gallop or rub Lungs: crackles in bases,  Abdomen:  Obese, soft, bsx4 active  Musculoskeletal:  No acute deformities Skin:  Warm/dry, trace edema  Recent Labs Lab 08/05/13 0500 08/06/13 0450 08/07/13 0530  NA 138 137 135*  K 3.4* 3.4* 4.0  CL 94* 91* 91*  CO2 33* 35* 36*  BUN 44* 44* 42*  CREATININE 2.34* 2.19* 2.10*  GLUCOSE 147* 151* 175*    Recent Labs Lab 08/05/13 0500 08/06/13 0450 08/07/13 0530  HGB 9.6* 10.0* 10.2*  HCT 28.4* 29.6* 29.7*  WBC 4.6 5.1 3.6*  PLT 132* 154  148*   Dg Chest Port 1 View  08/07/2013   CLINICAL DATA:  Post bronchoscopy and biopsy.  EXAM: PORTABLE CHEST - 1 VIEW  COMPARISON:  DG CHEST 1V PORT dated 08/05/2013; DG CHEST 1V PORT dated 08/04/2013; DG CHEST 2 VIEW dated 06/20/2013; DG CHEST 1V PORT dated 06/14/2013; CT L SPINE W/O CM dated 04/30/2013; DG CHEST 1V PORT dated 08/03/2013  FINDINGS: Trachea is midline. Right IJ central line tip projects over the SVC. Heart size stable. Left subclavian pacemaker lead tips project over the right atrium and right ventricle.  There is right hilar fullness. Mild diffuse bilateral interstitial prominence and indistinctness. Confluent opacity in the right infrahilar region. No definite pneumothorax. No pleural fluid.  IMPRESSION: 1. No definite pneumothorax after bronchoscopy. 2. Right hilar prominence with right infrahilar opacification. Findings correspond to a reported history of a right lung mass and right hilar adenopathy, when admission history and physical 07/31/2013 is reviewed. 3. Mild diffuse interstitial prominence and indistinctness, possibly due to edema.   Electronically Signed   By: Lorin Picket M.D.   On: 08/07/2013 08:23   ASSESSMENT / PLAN:  Acute Respiratory Failure - multifactorial in setting of decompensated CHF, acute hypertensive episode, +/- CAP.  Significant improvement in clinical status with bipap, nitro & lasix.  Reported never smoker.   CXR this AM remains consistent with pulmonary edema w/ possible CAP. Hilar density on right not appreciated on early films.  Plan: -cont diuresis, has improved very much with this neg 2.2 liters last 24 hrs -  Titrate oxygen for sats > 93% - Empiric abx as above, doubt pneumonia but will finish course, zithromax to be stopped after 3/27 dose and rocephin to be stopped after 3/30 dose. - NTG per Cardiology. - PCXR PRN. Needs f/u CXR 4-6 weeks after d/c re: right hilar opacity, concern unilateral slighted to rt, infection as well?  - Pulmonary hygiene   - Constipation - add MOM and if unsuccessful then will order fleet enema.  We will s/o call PRN agree can go to tele   Lavon Paganini. Titus Mould, MD, [place] Pgr: [facility]

## 2013-08-07 NOTE — Progress Notes (Signed)
TL IJ d/c'd per orders. Pressure held for 15 minutes.  Pressure dressing applied and pt to lie flat for 30 min.  VSS.  Will continue to monitor.

## 2013-08-08 LAB — GLUCOSE, CAPILLARY
GLUCOSE-CAPILLARY: 120 mg/dL — AB (ref 70–99)
Glucose-Capillary: 156 mg/dL — ABNORMAL HIGH (ref 70–99)
Glucose-Capillary: 270 mg/dL — ABNORMAL HIGH (ref 70–99)
Glucose-Capillary: 159 mg/dL — ABNORMAL HIGH (ref 70–99)

## 2013-08-08 MED ORDER — PANTOPRAZOLE SODIUM 20 MG PO TBEC
20.0000 mg | DELAYED_RELEASE_TABLET | Freq: Every day | ORAL | Status: DC
  Administered 2013-08-08 – 2013-08-13 (×6): 20 mg via ORAL
  Filled 2013-08-08 (×7): qty 1

## 2013-08-08 MED ORDER — DEXTROSE 5 % IV SOLN
500.0000 mg | Freq: Once | INTRAVENOUS | Status: AC
  Administered 2013-08-08: 500 mg via INTRAVENOUS
  Filled 2013-08-08: qty 500

## 2013-08-08 MED ORDER — FLUTICASONE PROPIONATE 50 MCG/ACT NA SUSP
2.0000 | Freq: Every day | NASAL | Status: DC
  Administered 2013-08-08 – 2013-08-12 (×5): 2 via NASAL
  Filled 2013-08-08: qty 16

## 2013-08-08 NOTE — Progress Notes (Signed)
Patient Name: [name] Date of Encounter: 08/08/2013  Active Problems:   Acute diastolic heart failure   Acute on chronic renal failure   Length of Stay: 5  SUBJECTIVE  Laying comfortably in bed, complains of paroxysmal cough especially at night.   CURRENT MEDS . allopurinol  100 mg Oral QHS  . amiodarone  200 mg Oral Daily  . apixaban  2.5 mg Oral BID  . cefTRIAXone (ROCEPHIN)  IV  1 g Intravenous Q24H  . dutasteride  0.5 mg Oral Daily  . ferrous sulfate  325 mg Oral QODAY  . hydrALAZINE  25 mg Oral 3 times per day  . insulin aspart  0-15 Units Subcutaneous TID WC  . insulin glargine  11 Units Subcutaneous QHS  . isosorbide dinitrate  20 mg Oral BID  . latanoprost  1 drop Both Eyes QHS  . metoprolol tartrate  25 mg Oral Daily  . multivitamin with minerals  1 tablet Oral Daily  . polyethylene glycol  17 g Oral Daily  . polyvinyl alcohol  1 drop Both Eyes Daily  . sodium chloride  3 mL Intravenous Q12H  . tamsulosin  0.4 mg Oral Daily  . torsemide  60 mg Oral q1800  . torsemide  80 mg Oral q morning - 10a   OBJECTIVE  Filed Vitals:   08/07/13 2006 08/07/13 2204 08/08/13 0425 08/08/13 0604  BP: 130/65 125/56 128/60 111/51  Pulse: 60  62   Temp: 98.1 F (36.7 C)  98.4 F (36.9 C)   TempSrc: Oral  Oral   Resp: 19  18   Height:      Weight:   193 lb 2 oz (87.6 kg)   SpO2: 92%  93%     Intake/Output Summary (Last 24 hours) at 08/08/13 1237 Last data filed at 08/08/13 0807  Gross per 24 hour  Intake    610 ml  Output   1750 ml  Net  -1140 ml   Filed Weights   08/06/13 0448 08/07/13 0500 08/08/13 0425  Weight: 192 lb 14.4 oz (87.5 kg) 191 lb 5.8 oz (86.8 kg) 193 lb 2 oz (87.6 kg)   PHYSICAL EXAM  General: Pleasant, NAD. Neuro: Alert and oriented X 3. Moves all extremities spontaneously. Psych: Normal affect. HEENT:  Normal  Neck: Supple without bruits or JVD. Lungs:  Resp regular and unlabored, mild basilar crackles, B/L wheezing Heart: RRR no s3,  s4, or murmurs. Abdomen: Soft, non-tender, non-distended, BS + x 4.  Extremities: No clubbing, cyanosis or edema. DP/PT/Radials 2+ and equal bilaterally.  Accessory Clinical Findings  CBC  Recent Labs  08/06/13 0450 08/07/13 0530  WBC 5.1 3.6*  HGB 10.0* 10.2*  HCT 29.6* 29.7*  MCV 97.0 97.7  PLT 154 119*   Basic Metabolic Panel  Recent Labs  08/06/13 0450 08/07/13 0530  NA 137 135*  K 3.4* 4.0  CL 91* 91*  CO2 35* 36*  GLUCOSE 151* 175*  BUN 44* 42*  CREATININE 2.19* 2.10*  CALCIUM 8.8 8.8  MG 2.2 2.4  PHOS 3.5 2.9   Radiology/Studies  08/07/2013   CLINICAL DATA:  Post bronchoscopy and biopsy.  EXAM: PORTABLE CHEST - 1 VIEW  COMPARISON:  DG CHEST 1V PORT dated 08/05/2013; DG CHEST 1V PORT dated 08/04/2013; DG CHEST 2 VIEW dated 06/20/2013; DG CHEST 1V PORT dated 06/14/2013; CT L SPINE W/O CM dated 04/30/2013; DG CHEST 1V PORT dated 08/03/2013  FINDINGS: Trachea is midline. Right IJ central line tip projects over  the SVC. Heart size stable. Left subclavian pacemaker lead tips project over the right atrium and right ventricle.  There is right hilar fullness. Mild diffuse bilateral interstitial prominence and indistinctness. Confluent opacity in the right infrahilar region. No definite pneumothorax. No pleural fluid.  IMPRESSION: 1. No definite pneumothorax after bronchoscopy. 2. Right hilar prominence with right infrahilar opacification. Findings correspond to a reported history of a right lung mass and right hilar adenopathy, when admission history and physical 07/31/2013 is reviewed. 3. Mild diffuse interstitial prominence and indistinctness, possibly due to edema.   Electronically Signed   By: Lorin Picket M.D.   On: 08/07/2013 08:23   TELE: paced, nsVT - 3 beats, no a-fibs    ASSESSMENT AND PLAN  80 y/o M admitted with worsening cough, chest tightness, shortness of breath. Work up noted bilateral pleural effusions. Pt decompensated evening of admission with hypertensive  episode which responded to lasix, NTG & bipap.   1. Acute diastolic heart failure - negative fluid balance, mild crackles at the basis, Continue same dose diuretics, amiodarone, anticoagulation and vasodilators  2. Suspect cough is bronchitic/pneumonia, on antibiotics, note radiologist concerns re: right lung mass - PCCM following, will discuss, also possible contribution of GERD (cought at night)  3. Acute on chronic renal failure - stable on diuretics, continue the same dose  4. PAF - in SR  5. Dual chamber pacemaker for complete heart block    Signed, [name] MD, [facility] 08/08/2013

## 2013-08-09 LAB — GLUCOSE, CAPILLARY
GLUCOSE-CAPILLARY: 116 mg/dL — AB (ref 70–99)
GLUCOSE-CAPILLARY: 287 mg/dL — AB (ref 70–99)
Glucose-Capillary: 184 mg/dL — ABNORMAL HIGH (ref 70–99)

## 2013-08-09 NOTE — Progress Notes (Signed)
Therapy is now recommending HH. No inpt rehab needs at this time. [phone]

## 2013-08-09 NOTE — Progress Notes (Addendum)
Patient Name: [name] Date of Encounter: 08/09/2013  Active Problems:   Acute diastolic heart failure   Acute on chronic renal failure   Length of Stay: 6  SUBJECTIVE  The patient states that his cough and SOB have improved. He is complaining of constipation.   CURRENT MEDS . allopurinol  100 mg Oral QHS  . amiodarone  200 mg Oral Daily  . apixaban  2.5 mg Oral BID  . cefTRIAXone (ROCEPHIN)  IV  1 g Intravenous Q24H  . dutasteride  0.5 mg Oral Daily  . ferrous sulfate  325 mg Oral QODAY  . fluticasone  2 spray Each Nare Daily  . hydrALAZINE  25 mg Oral 3 times per day  . insulin aspart  0-15 Units Subcutaneous TID WC  . insulin glargine  11 Units Subcutaneous QHS  . isosorbide dinitrate  20 mg Oral BID  . latanoprost  1 drop Both Eyes QHS  . metoprolol tartrate  25 mg Oral Daily  . multivitamin with minerals  1 tablet Oral Daily  . pantoprazole  20 mg Oral Daily  . polyethylene glycol  17 g Oral Daily  . polyvinyl alcohol  1 drop Both Eyes Daily  . sodium chloride  3 mL Intravenous Q12H  . tamsulosin  0.4 mg Oral Daily  . torsemide  60 mg Oral q1800  . torsemide  80 mg Oral q morning - 10a   OBJECTIVE  Filed Vitals:   08/08/13 1350 08/08/13 2041 08/09/13 0425 08/09/13 0500  BP: 124/54 132/65 140/63   Pulse: 63 64 63   Temp: 98.3 F (36.8 C) 99.3 F (37.4 C) 97.9 F (36.6 C)   TempSrc: Oral Oral Oral   Resp: 18 18 20    Height:      Weight:    190 lb 3.2 oz (86.274 kg)  SpO2: 96% 93% 96%     Intake/Output Summary (Last 24 hours) at 08/09/13 1048 Last data filed at 08/09/13 0900  Gross per 24 hour  Intake    850 ml  Output   2275 ml  Net  -1425 ml   Filed Weights   08/07/13 0500 08/08/13 0425 08/09/13 0500  Weight: 191 lb 5.8 oz (86.8 kg) 193 lb 2 oz (87.6 kg) 190 lb 3.2 oz (86.274 kg)   PHYSICAL EXAM  General: Pleasant, NAD. Neuro: Alert and oriented X 3. Moves all extremities spontaneously. Psych: Normal affect. HEENT:  Normal  Neck:  Supple without bruits or JVD. Lungs:  Resp regular and unlabored, CTA, no wheezing Heart: RRR no s3, s4, or murmurs. Abdomen: Soft, non-tender, non-distended, BS + x 4.  Extremities: No clubbing, cyanosis or edema. DP/PT/Radials 2+ and equal bilaterally.  Accessory Clinical Findings  CBC  Recent Labs  08/07/13 0530  WBC 3.6*  HGB 10.2*  HCT 29.7*  MCV 97.7  PLT 361*   Basic Metabolic Panel  Recent Labs  08/07/13 0530  NA 135*  K 4.0  CL 91*  CO2 36*  GLUCOSE 175*  BUN 42*  CREATININE 2.10*  CALCIUM 8.8  MG 2.4  PHOS 2.9   Radiology/Studies  08/07/2013     IMPRESSION: 1. No definite pneumothorax after bronchoscopy. 2. Right hilar prominence with right infrahilar opacification. Findings correspond to a reported history of a right lung mass and right hilar adenopathy, when admission history and physical 07/31/2013 is reviewed. 3. Mild diffuse interstitial prominence and indistinctness, possibly due to edema.   Electronically Signed   By: Lorin Picket M.D.  On: 08/07/2013 08:23   TELE: paced, nsVT - 3 beats, no a-fibs    ASSESSMENT AND PLAN  80 y/o M admitted with worsening cough, chest tightness, shortness of breath. Work up noted bilateral pleural effusions. Pt decompensated evening of admission with hypertensive episode which responded to lasix, NTG & bipap.   1. Acute diastolic heart failure - negative fluid balance,appears euvolemic now, Continue same dose diuretics (this is his home dose), amiodarone, anticoagulation and vasodilators  2. Suspect cough is bronchitic/pneumonia, on antibiotics, note radiologist concerns re: right lung mass - PCCM following, will discuss, also possible contribution of GERD (cought at night), pantoprazole was added yesterday.  3. Acute on chronic renal failure - stable on diuretics, continue the same dose, we wil recheck Crea today  4. PAF - in SR  5. Dual chamber pacemaker for complete heart block  5. Constipation - no  respond to Miralax, we will add enema (patient requested it as it helped in the  Past)   Signed, [name] MD, [facility] 08/09/2013

## 2013-08-09 NOTE — Progress Notes (Signed)
Administered soaps sud enema per MD order per hospital policy. Patient tolerated well and passed stool in the bed. Will continue to monitor closely. Glade Nurse, RN

## 2013-08-10 ENCOUNTER — Inpatient Hospital Stay (HOSPITAL_COMMUNITY): Payer: Medicare Other

## 2013-08-10 ENCOUNTER — Ambulatory Visit: Payer: Medicare Other | Admitting: Interventional Cardiology

## 2013-08-10 DIAGNOSIS — R599 Enlarged lymph nodes, unspecified: Secondary | ICD-10-CM

## 2013-08-10 DIAGNOSIS — R590 Localized enlarged lymph nodes: Secondary | ICD-10-CM | POA: Diagnosis present

## 2013-08-10 LAB — BASIC METABOLIC PANEL
BUN: 45 mg/dL — ABNORMAL HIGH (ref 6–23)
CO2: 34 mEq/L — ABNORMAL HIGH (ref 19–32)
Calcium: 9.1 mg/dL (ref 8.4–10.5)
Chloride: 84 mEq/L — ABNORMAL LOW (ref 96–112)
Creatinine, Ser: 2.49 mg/dL — ABNORMAL HIGH (ref 0.50–1.35)
GFR calc Af Amer: 24 mL/min — ABNORMAL LOW (ref >90)
GFR calc non Af Amer: 21 mL/min — ABNORMAL LOW (ref >90)
Glucose, Bld: 149 mg/dL — ABNORMAL HIGH (ref 70–99)
Potassium: 3 mEq/L — ABNORMAL LOW (ref 3.7–5.3)
Sodium: 135 mEq/L — ABNORMAL LOW (ref 137–147)

## 2013-08-10 LAB — CULTURE, BLOOD (ROUTINE X 2)
CULTURE: NO GROWTH
Culture: NO GROWTH

## 2013-08-10 LAB — GLUCOSE, CAPILLARY
GLUCOSE-CAPILLARY: 138 mg/dL — AB (ref 70–99)
GLUCOSE-CAPILLARY: 250 mg/dL — AB (ref 70–99)
Glucose-Capillary: 205 mg/dL — ABNORMAL HIGH (ref 70–99)
Glucose-Capillary: 240 mg/dL — ABNORMAL HIGH (ref 70–99)
Glucose-Capillary: 162 mg/dL — ABNORMAL HIGH (ref 70–99)

## 2013-08-10 MED ORDER — TORSEMIDE 20 MG PO TABS
40.0000 mg | ORAL_TABLET | Freq: Every morning | ORAL | Status: DC
  Administered 2013-08-10: 40 mg via ORAL
  Filled 2013-08-10 (×2): qty 2

## 2013-08-10 MED ORDER — POTASSIUM CHLORIDE CRYS ER 20 MEQ PO TBCR
40.0000 meq | EXTENDED_RELEASE_TABLET | Freq: Two times a day (BID) | ORAL | Status: AC
  Administered 2013-08-10 (×2): 40 meq via ORAL
  Filled 2013-08-10 (×2): qty 2

## 2013-08-10 NOTE — Progress Notes (Signed)
Physical Therapy Treatment Patient Details Name: [name] MRN: [id number] DOB: Jun 30, 1918 Today's Date: 08/10/2013    History of Present Illness Pt admit with CHF.      PT Comments    Progressing well, breathing much better than on arrival.  Sats maintained in the 90's on RA with minimal exertion.  Follow Up Recommendations  Home health PT;Supervision/Assistance - 24 hour     Equipment Recommendations   (TBA)    Recommendations for Other Services       Precautions / Restrictions Precautions Precautions: Fall    Mobility  Bed Mobility                  Transfers Overall transfer level: Needs assistance Equipment used: Rolling walker (2 wheeled) Transfers: Sit to/from Stand Sit to Stand: Min guard         General transfer comment: cues for hand placement and safety   Ambulation/Gait Ambulation/Gait assistance: Min guard Ambulation Distance (Feet): 110 Feet Assistive device: Rolling walker (2 wheeled) Gait Pattern/deviations: Step-through pattern     General Gait Details: v/tc's for postural checks, but generally steady throughout.  Much improved breathing as sats maintained in low to mid  90's on RA   Stairs            Wheelchair Mobility    Modified Rankin (Stroke Patients Only)       Balance Overall balance assessment: Needs assistance Sitting-balance support: Feet supported;No upper extremity supported Sitting balance-Leahy Scale: Fair     Standing balance support: No upper extremity supported;During functional activity Standing balance-Leahy Scale: Fair                      Cognition Arousal/Alertness: Awake/alert Behavior During Therapy: WFL for tasks assessed/performed Overall Cognitive Status: Within Functional Limits for tasks assessed                      Exercises General Exercises - Lower Extremity Ankle Circles/Pumps: AROM;Both;10 reps;Seated Long Arc Quad: AROM;Both;10 reps;Seated Hip  ABduction/ADduction: AROM;Both;10 reps;Standing Hip Flexion/Marching: AROM;Both;10 reps;Seated    General Comments        Pertinent Vitals/Pain     Home Living                      Prior Function            PT Goals (current goals can now be found in the care plan section) Acute Rehab PT Goals PT Goal Formulation: With patient Time For Goal Achievement: 08/11/13 Potential to Achieve Goals: Good Progress towards PT goals: Progressing toward goals    Frequency  Min 3X/week    PT Plan Discharge plan needs to be updated    End of Session   Activity Tolerance: Patient tolerated treatment well Patient left: in chair;with call bell/phone within reach;with family/visitor present     Time: 8588-5027 PT Time Calculation (min): 23 min  Charges:  $Gait Training: 8-22 mins $Therapeutic Exercise: 23-37 mins                    G Codes:      Thuy Atilano, Tessie Fass 08/10/2013, 5:07 PM 08/10/2013  Donnella Sham, PT [phone]  (pager)

## 2013-08-10 NOTE — Care Management Note (Unsigned)
    Page 1 of 1   08/11/2013     5:22:52 PM   CARE MANAGEMENT NOTE 08/11/2013  Patient:  [name], [name]   Account Number:  [id number]  Date Initiated:  08/10/2013  Documentation initiated by:  Tru Rana  Subjective/Objective Assessment:   PT ADM ON 08/03/13 WITH CHF, PNA.  PTA, PT RESIDED AT [place].     Action/Plan:   P.T. RECOMMENDING HH FOLLOW UP AT DC; WOULD RECOMMEND HHRN FOR CHF FOLLOW UP AND HHPT.  MD PLEASE ORDERS IF YOU AGREE.   Anticipated DC Date:  08/12/2013   Anticipated DC Plan:  [place]  CM consult      Choice offered to / List presented to:             Status of service:  In process, will continue to follow Medicare Important Message given?   (If response is "NO", the following Medicare IM given date fields will be blank) Date Medicare IM given:   Date Additional Medicare IM given:    Discharge Disposition:    Per UR Regulation:  Reviewed for med. necessity/level of care/duration of stay  If discussed at [place] of Stay Meetings, dates discussed:   08/11/2013    Comments:  08/11/13 Darrell Goin,RN,BSN [phone] MET WITH PT AND SON TO DISCUSS DC PLANS.  PT STATES SON AND CAREGIVER PROVIDE 24HR CARE AT HOME.  PT HAS HX OF HH CARE WITH [facility]  IN THE PAST, AND IS AGREEABLE TO HH AGAIN. WOULD RECOMMEND HHRN FOR CHF FOLLOW UP AND HHPT; MD/PA, PLEASE LEAVE ORDERS IF YOU AGREE.  THANKS.

## 2013-08-10 NOTE — Progress Notes (Signed)
   SUBJECTIVE:  He says that today is the first day that he is feeling better.  Still not at baseline.     PHYSICAL EXAM Filed Vitals:   08/09/13 1415 08/09/13 2138 08/10/13 0450 08/10/13 0700  BP: 130/64 119/58 115/56   Pulse: 64 69 62   Temp: 97.4 F (36.3 C) 98.3 F (36.8 C) 98.1 F (36.7 C)   TempSrc: Oral Oral Oral   Resp: 20 18 18    Height:      Weight:    189 lb (85.73 kg)  SpO2: 99% 92% 92%    General:  No distress Lungs:  Decerased breath sounds without crackles Heart:  RRR Abdomen:  Positive bowel sounds, no rebound no guarding Extremities:  No edema Neuro:  Nonfocal.   LABS:  Results for orders placed during the hospital encounter of 08/03/13 (from the past 24 hour(s))  GLUCOSE, CAPILLARY     Status: Abnormal   Collection Time    08/09/13 11:13 AM      Result Value Ref Range   Glucose-Capillary 287 (*) 70 - 99 mg/dL  GLUCOSE, CAPILLARY     Status: Abnormal   Collection Time    08/09/13  3:41 PM      Result Value Ref Range   Glucose-Capillary 184 (*) 70 - 99 mg/dL   Comment 1 Documented in Chart     Comment 2 Notify RN    GLUCOSE, CAPILLARY     Status: Abnormal   Collection Time    08/09/13  9:31 PM      Result Value Ref Range   Glucose-Capillary 205 (*) 70 - 99 mg/dL  BASIC METABOLIC PANEL     Status: Abnormal   Collection Time    08/10/13  4:10 AM      Result Value Ref Range   Sodium 135 (*) 137 - 147 mEq/L   Potassium 3.0 (*) 3.7 - 5.3 mEq/L   Chloride 84 (*) 96 - 112 mEq/L   CO2 34 (*) 19 - 32 mEq/L   Glucose, Bld 149 (*) 70 - 99 mg/dL   BUN 45 (*) 6 - 23 mg/dL   Creatinine, Ser 2.49 (*) 0.50 - 1.35 mg/dL   Calcium 9.1  8.4 - 10.5 mg/dL   GFR calc non Af Amer 21 (*) >90 mL/min   GFR calc Af Amer 24 (*) >90 mL/min  GLUCOSE, CAPILLARY     Status: Abnormal   Collection Time    08/10/13  6:07 AM      Result Value Ref Range   Glucose-Capillary 138 (*) 70 - 99 mg/dL    Intake/Output Summary (Last 24 hours) at 08/10/13 0811 Last data filed at  08/10/13 0700  Gross per 24 hour  Intake    480 ml  Output   1475 ml  Net   -995 ml     ASSESSMENT AND PLAN:  Acute on chronic diastolic HF:  Torsemide decreased.  Potassium supplemented.    Right lung mass:  I spoke with pulmonary this morning and they will review the xrays and will leave further recommendations.    PAF:  NSR.  OK to discontinue telemetry.   Disposition:  Probably home in the AM.  Weak.  Needs PT or nursing to ambulate again today     Minus Breeding 08/10/2013 8:11 AM

## 2013-08-10 NOTE — Progress Notes (Signed)
Informed patient of ambulation. Patient wanted to rest at this time but agreed to sit in chair for lunch and ambulate sometime afterwards. Call bell near. Report and care released to [place], Rn.[name], [name]

## 2013-08-10 NOTE — Progress Notes (Signed)
PULMONARY / CRITICAL CARE MEDICINE  Name: [name] MRN: [id number] DOB: [birth year]    ADMISSION DATE:  08/03/2013 CONSULTATION DATE:  08/03/2013  REFERRING MD :  Cardiology PRIMARY SERVICE:  Cardiology   CHIEF COMPLAINT:  Respiratory distress.  Re consulted 3/30 to address lung mass.  BRIEF PATIENT DESCRIPTION: 80 yo admitted with worsening cough, chest tightness, shortness of breath.  Work up revealed bilateral pleural effusions.  Patient decompensated evening of admission with hypertensive episode which responded to lasix, nitroglycerin, BiPAP.  PCCM consulted for pulmonary evaluation. Re consulted 3/30 to comment on pulmonary mass.  SIGNIFICANT EVENTS / STUDIES:   LINES / TUBES:  CULTURES: 3/23  Blood >>>neg  ANTIBIOTICS: Rocephin  (empiric CAP) 3/23 >>> 3/30 Azithromycin (empiric CAP) 3/23 >>> 3/27  SUBJECTIVE:  PCCM called back to address R lung mass. Continued diuresis per cards. Nearing baseline.   VITAL SIGNS: Temp:  [97.4 F (36.3 C)-98.3 F (36.8 C)] 98.1 F (36.7 C) (03/30 0450) Pulse Rate:  [62-69] 62 (03/30 0450) Resp:  [18-20] 18 (03/30 0450) BP: (115-130)/(56-64) 115/56 mmHg (03/30 0450) SpO2:  [92 %-99 %] 92 % (03/30 0450) Weight:  [189 lb (85.73 kg)] 189 lb (85.73 kg) (03/30 0700)  PHYSICAL EXAMINATION: General:  wdwn elderly male in no respiratory distress Neuro:  AAOx4, communicates appropriately HEENT:  Mm pink/moist Cardiovascular:  s1s2 regular, paced rhythm, no gallop or rub Lungs: resps even non labored on RA, few scattered crackles  Abdomen:  Obese, soft, bsx4 active  Musculoskeletal:  No acute deformities Skin:  Warm/dry, trace edema  LABS:  CBC  Recent Labs Lab 08/05/13 0500 08/06/13 0450 08/07/13 0530  WBC 4.6 5.1 3.6*  HGB 9.6* 10.0* 10.2*  HCT 28.4* 29.6* 29.7*  PLT 132* 154 148*   Coag's No results found for this basename: APTT, INR,  in the last 168 hours BMET  Recent Labs Lab 08/06/13 0450 08/07/13 0530  08/10/13 0410  NA 137 135* 135*  K 3.4* 4.0 3.0*  CL 91* 91* 84*  CO2 35* 36* 34*  BUN 44* 42* 45*  CREATININE 2.19* 2.10* 2.49*  GLUCOSE 151* 175* 149*   Electrolytes  Recent Labs Lab 08/05/13 0500 08/06/13 0450 08/07/13 0530 08/10/13 0410  CALCIUM 8.7 8.8 8.8 9.1  MG 2.3 2.2 2.4  --   PHOS 3.9 3.5 2.9  --    Sepsis Markers No results found for this basename: LATICACIDVEN, PROCALCITON, O2SATVEN,  in the last 168 hours ABG No results found for this basename: PHART, PCO2ART, PO2ART,  in the last 168 hours Liver Enzymes No results found for this basename: AST, ALT, ALKPHOS, BILITOT, ALBUMIN,  in the last 168 hours Cardiac Enzymes No results found for this basename: TROPONINI, PROBNP,  in the last 168 hours Glucose  Recent Labs Lab 08/08/13 2038 08/09/13 0558 08/09/13 1113 08/09/13 1541 08/09/13 2131 08/10/13 0607  GLUCAP 120* 116* 287* 184* 205* 138*   IMAGING: No results found.  ASSESSMENT / PLAN:  Acute respiratory failure in setting of acute CHF exacerbation / CAP, resolved.  Supplemental oxygen.  Community acquired pneumonia / possibly aspiration pneumonia.  Pulmonary hygiene.  Antibiotics completed 3/30.  SLP evaluation r/o dysphagia / aspiration.  Acute on chronic systolic / diastolic heart failure.. Pulmonary edema.  Per Cardiology.  On Torsemide 40 daily.  R hilar fullness presented on CXR since 2012.  History of "R lung mas"?  No CT images available.    Non contrast chest CT ordered.  Darlina Sicilian, NP 08/10/2013  8:57 AM Pager: [phone] or [phone]  I have personally obtained history, examined patient, evaluated and interpreted laboratory and imaging results, reviewed medical records, formulated assessment / plan and placed orders.  Doree Fudge, MD [facility] [place] Pager: [phone]  08/10/2013, 10:29 AM

## 2013-08-10 NOTE — Progress Notes (Addendum)
Has rising renal numbers and 9 Liter diuresis since admission. Will decrease torsemide to 80 mg daily. Needs potassium and may be ready for discharge.

## 2013-08-11 DIAGNOSIS — R05 Cough: Secondary | ICD-10-CM

## 2013-08-11 DIAGNOSIS — R059 Cough, unspecified: Secondary | ICD-10-CM

## 2013-08-11 DIAGNOSIS — R911 Solitary pulmonary nodule: Secondary | ICD-10-CM

## 2013-08-11 LAB — GLUCOSE, CAPILLARY
GLUCOSE-CAPILLARY: 115 mg/dL — AB (ref 70–99)
GLUCOSE-CAPILLARY: 268 mg/dL — AB (ref 70–99)
GLUCOSE-CAPILLARY: 210 mg/dL — AB (ref 70–99)
Glucose-Capillary: 198 mg/dL — ABNORMAL HIGH (ref 70–99)

## 2013-08-11 LAB — BASIC METABOLIC PANEL
BUN: 50 mg/dL — ABNORMAL HIGH (ref 6–23)
CO2: 37 mEq/L — ABNORMAL HIGH (ref 19–32)
Calcium: 9 mg/dL (ref 8.4–10.5)
Chloride: 86 mEq/L — ABNORMAL LOW (ref 96–112)
Creatinine, Ser: 2.67 mg/dL — ABNORMAL HIGH (ref 0.50–1.35)
GFR calc Af Amer: 22 mL/min — ABNORMAL LOW (ref >90)
GFR calc non Af Amer: 19 mL/min — ABNORMAL LOW (ref >90)
Glucose, Bld: 114 mg/dL — ABNORMAL HIGH (ref 70–99)
Potassium: 3.9 mEq/L (ref 3.7–5.3)
Sodium: 134 mEq/L — ABNORMAL LOW (ref 137–147)

## 2013-08-11 MED ORDER — HYDROCOD POLST-CHLORPHEN POLST 10-8 MG/5ML PO LQCR: 5.0000 mL | Freq: Every evening | ORAL | Status: DC | PRN

## 2013-08-11 MED ORDER — SALINE SPRAY 0.65 % NA SOLN
1.0000 | NASAL | Status: DC | PRN
  Filled 2013-08-11: qty 44

## 2013-08-11 NOTE — Progress Notes (Addendum)
PULMONARY / CRITICAL CARE MEDICINE  Name: [name] MRN: [id number] DOB: [birth year]    ADMISSION DATE:  08/03/2013 CONSULTATION DATE:  08/03/2013  REFERRING MD :  Cardiology PRIMARY SERVICE:  Cardiology   CHIEF COMPLAINT:  Respiratory distress.  Re consulted 3/30 to address lung mass.  BRIEF PATIENT DESCRIPTION: 80 yo admitted with worsening cough, chest tightness, shortness of breath.  Work up revealed bilateral pleural effusions.  Patient decompensated evening of admission with hypertensive episode which responded to lasix, nitroglycerin, BiPAP.  PCCM consulted for pulmonary evaluation. Re consulted 3/30 to comment on pulmonary mass.  SIGNIFICANT EVENTS / STUDIES:  CT Chest 3/30 >>> trace pericardial effusion, mild cardiomegaly, small bilateral pleural effusions R>L, 1.1x1.1x1.1 round nodule in RLL - indeterminate but worrisome for possible primary lung neoplasm, Radiology recommending further evaluation with PET-CT.  LINES / TUBES:  CULTURES:  3/23  Blood >>>neg  ANTIBIOTICS: Rocephin  (empiric CAP) 3/23 >>> 3/30 Azithromycin (empiric CAP) 3/23 >>> 3/27  SUBJECTIVE:  Weak, up most of night with non-productive cough.  Denies chest pain, SOB.  VITAL SIGNS: Temp:  [98.1 F (36.7 C)-98.4 F (36.9 C)] 98.1 F (36.7 C) (03/31 0450) Pulse Rate:  [59-72] 72 (03/31 0450) Resp:  [19-20] 19 (03/31 0450) BP: (102-125)/(50-65) 115/65 mmHg (03/31 0450) SpO2:  [91 %-100 %] 93 % (03/31 0450) Weight:  [189 lb 6 oz (85.9 kg)] 189 lb 6 oz (85.9 kg) (03/31 0450)  PHYSICAL EXAMINATION: General:  wdwn pleasant elderly male in no respiratory distress Neuro:  A&O x 3, communicates appropriately, MAE HEENT:  Mm pink/moist Cardiovascular:  RRR, no M/R/G. Lungs: resps even non labored on RA, few scattered crackles  Abdomen:  Obese, soft, bsx4 active  Musculoskeletal:  No acute deformities Skin:  Warm/dry, trace edema  LABS:  CBC  Recent Labs Lab 08/05/13 0500 08/06/13 0450  08/07/13 0530  WBC 4.6 5.1 3.6*  HGB 9.6* 10.0* 10.2*  HCT 28.4* 29.6* 29.7*  PLT 132* 154 148*   Coag's No results found for this basename: APTT, INR,  in the last 168 hours BMET  Recent Labs Lab 08/07/13 0530 08/10/13 0410 08/11/13 0457  NA 135* 135* 134*  K 4.0 3.0* 3.9  CL 91* 84* 86*  CO2 36* 34* 37*  BUN 42* 45* 50*  CREATININE 2.10* 2.49* 2.67*  GLUCOSE 175* 149* 114*   Electrolytes  Recent Labs Lab 08/05/13 0500 08/06/13 0450 08/07/13 0530 08/10/13 0410 08/11/13 0457  CALCIUM 8.7 8.8 8.8 9.1 9.0  MG 2.3 2.2 2.4  --   --   PHOS 3.9 3.5 2.9  --   --    Sepsis Markers No results found for this basename: LATICACIDVEN, PROCALCITON, O2SATVEN,  in the last 168 hours ABG No results found for this basename: PHART, PCO2ART, PO2ART,  in the last 168 hours Liver Enzymes No results found for this basename: AST, ALT, ALKPHOS, BILITOT, ALBUMIN,  in the last 168 hours Cardiac Enzymes No results found for this basename: TROPONINI, PROBNP,  in the last 168 hours Glucose  Recent Labs Lab 08/09/13 2131 08/10/13 0607 08/10/13 1144 08/10/13 1625 08/10/13 2122 08/11/13 0623  GLUCAP 205* 138* 250* 240* 162* 115*   IMAGING: Ct Chest Wo Contrast  08/10/2013   CLINICAL DATA:  Cough.  Evaluate for infiltrates.  EXAM: CT CHEST WITHOUT CONTRAST  TECHNIQUE: Multidetector CT imaging of the chest was performed following the standard protocol without IV contrast.  COMPARISON:  Prior radiograph from 08/07/2013.  FINDINGS: The visualized thyroid is somewhat enlarged and  heterogeneous in appearance with substernal extension of the left thyroid lobe. Shotty mediastinal adenopathy noted. No pathologically enlarged mediastinal, hilar, or axillary lymph nodes are identified.  The intrathoracic aorta is of normal caliber. Prominent atherosclerotic calcifications seen within the aortic arch and at the origin of the great vessels.  Left-sided transvenous pacemaker/AICD noted. Mild cardiomegaly  present. Diffuse 3 vessel coronary artery calcifications with aortic valvular calcifications noted. Trace pericardial effusion is present.  Small layering bilateral pleural effusions are present, right slightly greater than left. No focal airspace consolidation to suggest acute infectious pneumonitis is identified. Mild subsegmental atelectasis seen dependently within the lower lobes bilaterally. No overt interlobular septal thickening to suggest pulmonary edema. There is no pneumothorax.  A round nodule measuring 1.1 x 1.1 x 1.1 cm is seen within the right lower lobe just above the right hemidiaphragm (series 3, image 48), indeterminate. No other pulmonary nodule or mass identified.  Limited noncontrast evaluation of the upper abdomen is within normal limits. Small hiatal hernia noted.  No acute osseous abnormality. No worrisome lytic or blastic osseous lesions. Small sclerotic focus within the anterior left 6 for likely represents small bone island. Multilevel degenerative changes noted within the visualized spine.  IMPRESSION: 1. No focal infiltrates identified to suggest acute infectious pneumonitis. 2. Small bilateral pleural effusions, right slightly greater than left. 3. 1.1 x 1.1 x 1.1 cm nodule right lower lobe nodule as above. Finding is indeterminate, but worrisome for possible primary lung neoplasm. Further evaluation with PET-CT recommended. 4. Mild cardiomegaly with diffuse 3 vessel coronary artery calcifications and trace pericardial effusion. 5. Small hiatal hernia.   Electronically Signed   By: Jeannine Boga M.D.   On: 08/10/2013 13:46    ASSESSMENT / PLAN:   R hilar fullness presented on CXR > no mass there  Right lower lobe pulmonary nodule> 1.1 cm, round, smooth, solid> He and I had a long conversation about this today.  Given his age and the fact that it is greater than one centimeter we are worried about malignancy.  However, there is no pressing need to evaluate further right  now as he feels it may be just inflammatory from his recent CAP.    He will see [facility] as an outpatient in May and we will order a CT chest.  Cough: I recommend Milta Deiters Med Saline Rinse qHS Flonase 2 puff daily  Montey Hora, PA - [facility] Pgr: [phone]  or [phone]   Attending:  I have seen and examined the patient with nurse practitioner/resident and agree with and have edited the note above.   Jillyn Hidden PCCM Pager: [phone] Cell: [phone] If no response, call [phone]

## 2013-08-11 NOTE — Progress Notes (Signed)
   SUBJECTIVE:  He is very weak.  He said that he coughed all night but that this was nonproductive.  Mouth feels dry.    PHYSICAL EXAM Filed Vitals:   08/10/13 0944 08/10/13 1400 08/10/13 2046 08/11/13 0450  BP: 125/55 102/50 110/50 115/65  Pulse: 64 59 61 72  Temp:  98.1 F (36.7 C) 98.4 F (36.9 C) 98.1 F (36.7 C)  TempSrc:   Oral Oral  Resp:   20 19  Height:      Weight:    189 lb 6 oz (85.9 kg)  SpO2:  100% 91% 93%   General:  No distress Lungs:  Decerased breath sounds without crackles Heart:  RRR Abdomen:  Positive bowel sounds, no rebound no guarding Extremities:  No edema   LABS:  Results for orders placed during the hospital encounter of 08/03/13 (from the past 24 hour(s))  GLUCOSE, CAPILLARY     Status: Abnormal   Collection Time    08/10/13 11:44 AM      Result Value Ref Range   Glucose-Capillary 250 (*) 70 - 99 mg/dL  GLUCOSE, CAPILLARY     Status: Abnormal   Collection Time    08/10/13  4:25 PM      Result Value Ref Range   Glucose-Capillary 240 (*) 70 - 99 mg/dL  GLUCOSE, CAPILLARY     Status: Abnormal   Collection Time    08/10/13  9:22 PM      Result Value Ref Range   Glucose-Capillary 162 (*) 70 - 99 mg/dL  BASIC METABOLIC PANEL     Status: Abnormal   Collection Time    08/11/13  4:57 AM      Result Value Ref Range   Sodium 134 (*) 137 - 147 mEq/L   Potassium 3.9  3.7 - 5.3 mEq/L   Chloride 86 (*) 96 - 112 mEq/L   CO2 37 (*) 19 - 32 mEq/L   Glucose, Bld 114 (*) 70 - 99 mg/dL   BUN 50 (*) 6 - 23 mg/dL   Creatinine, Ser 2.67 (*) 0.50 - 1.35 mg/dL   Calcium 9.0  8.4 - 10.5 mg/dL   GFR calc non Af Amer 19 (*) >90 mL/min   GFR calc Af Amer 22 (*) >90 mL/min  GLUCOSE, CAPILLARY     Status: Abnormal   Collection Time    08/11/13  6:23 AM      Result Value Ref Range   Glucose-Capillary 115 (*) 70 - 99 mg/dL    Intake/Output Summary (Last 24 hours) at 08/11/13 0742 Last data filed at 08/11/13 0653  Gross per 24 hour  Intake    600 ml    Output   1300 ml  Net   -700 ml     ASSESSMENT AND PLAN:  Acute on chronic diastolic HF:   Potassium supplemented.  I suspect that he is weak from over diuresis.  I will hold Torsemide.    Right lung mass:  Discussed with Dr. Kennon Holter and [name].  At this time we will not plan in patient work up.  [name] will discuss it with him further at follow up.     PAF:  NSR.  Telemetry discontinued  Disposition:  Not home today. He is too weak.      [name] [facility] 08/11/2013 7:42 AM

## 2013-08-11 NOTE — Discharge Instructions (Signed)
Use Saline rinses Milta Deiters Med Rinse every night at bedtime

## 2013-08-11 NOTE — Progress Notes (Signed)
We had a long discussion this AM. Our approach to the lung nodule will be conservative. At his age and with recurrent severe diastolic heart failure possibly driven by CAD, he won't tolerate much mechanical/procedural manipulation. Plan to allow him to rehydrate and possibly home Wednesday or Thursday. Star claritin for nasal congestion and cough

## 2013-08-12 ENCOUNTER — Telehealth: Payer: Self-pay

## 2013-08-12 LAB — BASIC METABOLIC PANEL
BUN: 49 mg/dL — ABNORMAL HIGH (ref 6–23)
CO2: 31 mEq/L (ref 19–32)
Calcium: 9.3 mg/dL (ref 8.4–10.5)
Chloride: 86 mEq/L — ABNORMAL LOW (ref 96–112)
Creatinine, Ser: 2.39 mg/dL — ABNORMAL HIGH (ref 0.50–1.35)
GFR calc Af Amer: 25 mL/min — ABNORMAL LOW (ref >90)
GFR calc non Af Amer: 22 mL/min — ABNORMAL LOW (ref >90)
Glucose, Bld: 123 mg/dL — ABNORMAL HIGH (ref 70–99)
Potassium: 3.3 mEq/L — ABNORMAL LOW (ref 3.7–5.3)
Sodium: 132 mEq/L — ABNORMAL LOW (ref 137–147)

## 2013-08-12 LAB — URINALYSIS, ROUTINE W REFLEX MICROSCOPIC
Bilirubin Urine: NEGATIVE
Glucose, UA: NEGATIVE mg/dL
Hgb urine dipstick: NEGATIVE
KETONES UR: NEGATIVE mg/dL
NITRITE: NEGATIVE
PROTEIN: 30 mg/dL — AB
Specific Gravity, Urine: 1.01 (ref 1.005–1.030)
Urobilinogen, UA: 0.2 mg/dL (ref 0.0–1.0)
pH: 8 (ref 5.0–8.0)

## 2013-08-12 LAB — GLUCOSE, CAPILLARY
GLUCOSE-CAPILLARY: 192 mg/dL — AB (ref 70–99)
Glucose-Capillary: 133 mg/dL — ABNORMAL HIGH (ref 70–99)
Glucose-Capillary: 285 mg/dL — ABNORMAL HIGH (ref 70–99)
Glucose-Capillary: 144 mg/dL — ABNORMAL HIGH (ref 70–99)
Glucose-Capillary: 195 mg/dL — ABNORMAL HIGH (ref 70–99)

## 2013-08-12 LAB — URINE MICROSCOPIC-ADD ON

## 2013-08-12 MED ORDER — POTASSIUM CHLORIDE CRYS ER 20 MEQ PO TBCR
40.0000 meq | EXTENDED_RELEASE_TABLET | Freq: Once | ORAL | Status: AC
  Administered 2013-08-12: 40 meq via ORAL
  Filled 2013-08-12: qty 2

## 2013-08-12 NOTE — Progress Notes (Signed)
Physical Therapy Treatment Patient Details Name: [name] MRN: [id number] DOB: 1918-11-07 Today's Date: 08/12/2013    History of Present Illness Pt admit with CHF.      PT Comments    Progressing well.  Maintained sats in the mid 90's through out  HR/EHR were in the 77 bpm range.   Follow Up Recommendations  Home health PT;Supervision/Assistance - 24 hour     Equipment Recommendations  Other (comment) (TBA)    Recommendations for Other Services       Precautions / Restrictions Precautions Precautions: Fall    Mobility  Bed Mobility                  Transfers Overall transfer level: Needs assistance Equipment used: Rolling walker (2 wheeled) Transfers: Sit to/from Stand Sit to Stand: Min assist;Min guard         General transfer comment: cues for hand placement and safety   Ambulation/Gait Ambulation/Gait assistance: Min guard Ambulation Distance (Feet): 160 Feet Assistive device: Rolling walker (2 wheeled) Gait Pattern/deviations: Step-through pattern Gait velocity: slow Gait velocity interpretation: Below normal speed for age/gender General Gait Details: v/tc's for postural checks, but generally steady throughout.  Much improved breathing as sats maintained in low to mid  90's on RA   Stairs            Wheelchair Mobility    Modified Rankin (Stroke Patients Only)       Balance Overall balance assessment: Needs assistance Sitting-balance support: No upper extremity supported Sitting balance-Leahy Scale: Fair     Standing balance support: No upper extremity supported;Bilateral upper extremity supported Standing balance-Leahy Scale: Fair Standing balance comment: occaisional buckling of R Knee somewhat disconcerting, but pt can self recover.                    Cognition Arousal/Alertness: Awake/alert Behavior During Therapy: WFL for tasks assessed/performed Overall Cognitive Status: Within Functional Limits for tasks  assessed                      Exercises General Exercises - Lower Extremity Hip ABduction/ADduction: AROM;Both;10 reps;Standing Hip Flexion/Marching: AROM;Both;10 reps;Seated Heel Raises: AROM;AAROM;Both;10 reps;Standing Mini-Sqauts: AROM;Both;10 reps;Standing    General Comments        Pertinent Vitals/Pain See above    Home Living                      Prior Function            PT Goals (current goals can now be found in the care plan section) Acute Rehab PT Goals Patient Stated Goal: to go home PT Goal Formulation: With patient Time For Goal Achievement: 08/11/13 Potential to Achieve Goals: Good Progress towards PT goals: Progressing toward goals    Frequency  Min 3X/week    PT Plan Current plan remains appropriate    Co-evaluation             End of Session   Activity Tolerance: Patient tolerated treatment well Patient left: in chair;with call bell/phone within reach;with family/visitor present     Time: 4098-1191 PT Time Calculation (min): 24 min  Charges:  $Gait Training: 8-22 mins $Therapeutic Exercise: 8-22 mins                    G Codes:      Loura Pitt, Tessie Fass 08/12/2013, 4:51 PM 08/12/2013  Donnella Sham, PT [phone]  (pager)

## 2013-08-12 NOTE — Progress Notes (Signed)
I spoke with the office of Dr. Janice Norrie and requested an inpatient consult for Dr. Macky Lower urinary retention.   Lyda Jester, PA-C

## 2013-08-12 NOTE — Telephone Encounter (Signed)
Message copied by Len Blalock on Wed Aug 12, 2013 12:10 PM ------      Message from: Horatio Pel      Created: Tue Aug 11, 2013  1:12 PM                   ----- Message -----         From: Juanito Doom, MD         Sent: 08/11/2013  12:53 PM           To: [facility] Triage Pool            Hi All,            Please set up a hospital follow up appointment in May for me > pulmonary nodule.            Thanks      B ------

## 2013-08-12 NOTE — Progress Notes (Signed)
Pt with recurrent urinary retention. Last pm + pain with 700 cc on bladder scan, 500 cc removed.  Now this AM bladder scan with 500cc after voiding 300cc.  Will add foley and check U/A.  He is on flomax.  May need urology consult.

## 2013-08-12 NOTE — Telephone Encounter (Signed)
ERROR

## 2013-08-12 NOTE — Telephone Encounter (Signed)
LMTCB X1 to sched appt on home number.  Cell # is not the pt's number.  Please try to schedule on 08/19/13 at pt's preferred time.

## 2013-08-12 NOTE — Progress Notes (Signed)
SUBJECTIVE:  He still feels weak. Also endorsing urinary retention. He sill has an occasional cough with some occasional sputum reduction. No SOB or CP.   PHYSICAL EXAM Filed Vitals:   08/11/13 1449 08/11/13 2142 08/12/13 0500 08/12/13 0551  BP: 121/62 146/70  150/61  Pulse: 68 71  65  Temp: 99 F (37.2 C) 98.3 F (36.8 C)  97.9 F (36.6 C)  TempSrc: Oral Oral  Oral  Resp: 18 17    Height:      Weight:   189 lb (85.73 kg)   SpO2: 96% 95%  98%   General:  No distress Lungs:  Decerased breath sounds without crackles Heart:  RRR Extremities:  No LE edema   LABS:  Results for orders placed during the hospital encounter of 08/03/13 (from the past 24 hour(s))  GLUCOSE, CAPILLARY     Status: Abnormal   Collection Time    08/11/13 11:26 AM      Result Value Ref Range   Glucose-Capillary 268 (*) 70 - 99 mg/dL  GLUCOSE, CAPILLARY     Status: Abnormal   Collection Time    08/11/13  4:20 PM      Result Value Ref Range   Glucose-Capillary 210 (*) 70 - 99 mg/dL  GLUCOSE, CAPILLARY     Status: Abnormal   Collection Time    08/11/13  9:36 PM      Result Value Ref Range   Glucose-Capillary 198 (*) 70 - 99 mg/dL  BASIC METABOLIC PANEL     Status: Abnormal   Collection Time    08/12/13  4:58 AM      Result Value Ref Range   Sodium 132 (*) 137 - 147 mEq/L   Potassium 3.3 (*) 3.7 - 5.3 mEq/L   Chloride 86 (*) 96 - 112 mEq/L   CO2 31  19 - 32 mEq/L   Glucose, Bld 123 (*) 70 - 99 mg/dL   BUN 49 (*) 6 - 23 mg/dL   Creatinine, Ser 2.39 (*) 0.50 - 1.35 mg/dL   Calcium 9.3  8.4 - 10.5 mg/dL   GFR calc non Af Amer 22 (*) >90 mL/min   GFR calc Af Amer 25 (*) >90 mL/min  GLUCOSE, CAPILLARY     Status: Abnormal   Collection Time    08/12/13  5:56 AM      Result Value Ref Range   Glucose-Capillary 133 (*) 70 - 99 mg/dL    Intake/Output Summary (Last 24 hours) at 08/12/13 0758 Last data filed at 08/12/13 0556  Gross per 24 hour  Intake    480 ml  Output   1050 ml  Net   -570  ml     ASSESSMENT AND PLAN:  Acute on chronic diastolic HF:   No SOB and no peripheral edema. Next negative -10.3 L. Torsemide currently on hold due to possible over diuresis. Will need to restart on discharge.  Hypokalemic: replete with supplemental K  Urinary Retention: Pt with recurrent urinary retention. Last pm + pain with 700 cc on bladder scan, 500 cc removed. Now this AM bladder scan with 500cc after voiding 300cc.  Foley was ordered. U/A pending. Continue Flomax. May need urology consult.   Right lung mass:  Conservative management only. No inpatient w/u.   PAF:  NSR.  Telemetry discontinued  Disposition:  Home once stable.     [name], [name] 08/12/2013 7:58 AM  History and all data above reviewed.  Patient examined.  I agree with the  findings as above.  Difficulty urinating The patient exam reveals COR:RRR  ,  Lungs: Clear  ,  Abd: Positive bowel sounds, no rebound no guarding, Ext No edema  .  All available labs, radiology testing, previous records reviewed. Agree with documented assessment and plan. Seems to be euvolemic.  I will continue to hold the diuretic today.  Creat is improved.  We will ask urology to see if there are any recommendations about his urinary retention.  Supplement potassium.    [name] [name]  11:17 AM  08/12/2013

## 2013-08-13 DIAGNOSIS — J329 Chronic sinusitis, unspecified: Secondary | ICD-10-CM

## 2013-08-13 DIAGNOSIS — R339 Retention of urine, unspecified: Secondary | ICD-10-CM

## 2013-08-13 LAB — BASIC METABOLIC PANEL
BUN: 42 mg/dL — ABNORMAL HIGH (ref 6–23)
CO2: 31 mEq/L (ref 19–32)
Calcium: 8.8 mg/dL (ref 8.4–10.5)
Chloride: 89 mEq/L — ABNORMAL LOW (ref 96–112)
Creatinine, Ser: 2.15 mg/dL — ABNORMAL HIGH (ref 0.50–1.35)
GFR calc Af Amer: 29 mL/min — ABNORMAL LOW (ref >90)
GFR calc non Af Amer: 25 mL/min — ABNORMAL LOW (ref >90)
Glucose, Bld: 164 mg/dL — ABNORMAL HIGH (ref 70–99)
Potassium: 3.5 mEq/L — ABNORMAL LOW (ref 3.7–5.3)
Sodium: 136 mEq/L — ABNORMAL LOW (ref 137–147)

## 2013-08-13 LAB — GLUCOSE, CAPILLARY
GLUCOSE-CAPILLARY: 247 mg/dL — AB (ref 70–99)
GLUCOSE-CAPILLARY: 138 mg/dL — AB (ref 70–99)
Glucose-Capillary: 162 mg/dL — ABNORMAL HIGH (ref 70–99)
Glucose-Capillary: 254 mg/dL — ABNORMAL HIGH (ref 70–99)

## 2013-08-13 MED ORDER — POTASSIUM CHLORIDE CRYS ER 20 MEQ PO TBCR
20.0000 meq | EXTENDED_RELEASE_TABLET | Freq: Every day | ORAL | Status: DC
  Administered 2013-08-13: 20 meq via ORAL
  Filled 2013-08-13 (×2): qty 1

## 2013-08-13 MED ORDER — TORSEMIDE 20 MG PO TABS
80.0000 mg | ORAL_TABLET | Freq: Every day | ORAL | Status: DC
  Filled 2013-08-13: qty 4

## 2013-08-13 NOTE — Progress Notes (Signed)
[facility] contacted for f/u appt w/ Dr. Janice Norrie. They will call him.

## 2013-08-13 NOTE — Progress Notes (Signed)
       Patient Name: [name] Date of Encounter: 08/13/2013    SUBJECTIVE:Still has pressure in sinus area, mucous and congestion. Cough but no phlegm.  TELEMETRY:   NSR Filed Vitals:   08/12/13 1335 08/12/13 2058 08/13/13 0419 08/13/13 0556  BP: 121/52 140/60 143/65 132/68  Pulse: 60 67 57   Temp: 98.1 F (36.7 C) 98.8 F (37.1 C) 98.3 F (36.8 C)   TempSrc: Oral Oral Oral   Resp: 18 18 18    Height:      Weight:   189 lb 9.5 oz (86 kg)   SpO2: 98% 97% 95%     Intake/Output Summary (Last 24 hours) at 08/13/13 0834 Last data filed at 08/13/13 0558  Gross per 24 hour  Intake    240 ml  Output   1250 ml  Net  -1010 ml   LABS: Basic Metabolic Panel:  Recent Labs  08/12/13 0458 08/13/13 0526  NA 132* 136*  K 3.3* 3.5*  CL 86* 89*  CO2 31 31  GLUCOSE 123* 164*  BUN 49* 42*  CREATININE 2.39* 2.15*  CALCIUM 9.3 8.8     Radiology/Studies:  No new data  Physical Exam: Blood pressure 132/68, pulse 57, temperature 98.3 F (36.8 C), temperature source Oral, resp. rate 18, height 5\' 7"  (1.702 m), weight 189 lb 9.5 oz (86 kg), SpO2 95.00%. Weight change: 9.5 oz (0.27 kg)  Wt Readings from Last 3 Encounters:  08/13/13 189 lb 9.5 oz (86 kg)  07/02/13 190 lb 3.2 oz (86.274 kg)  06/25/13 188 lb (85.276 kg)    Neck veins are flat  Chest is clear Cardiac exam reveals no gallop or murmur. Extremities reveal no edema.  ASSESSMENT:  1. A/C diastolic HF, now compensated 2. A/C kidney disease stage 3-4 now back near baseline. 3. Possible sinusitis/allergy 4. A fib without recurrence. 5. Urinary retention, with foley taken out this AM by Dr. Janice Norrie  Plan:  1. ENT consult 2. Re insert foley if unable to urinate today 3. Home tomorrow 4. Resume diuretic tomorrow, Torsemide  Demetrios Isaacs 08/13/2013, 8:34 AM

## 2013-08-13 NOTE — Consult Note (Signed)
Patient complains of nasal congestion, PND and facial or sinus pressure.  Exam: Nasal endoscopy reveals swollen mucous membranes. No polyps or masses.             Drainage from the middle meatus is clear but thick              No mucopurulent discharge  IMP: Reactive rhinitis or allergic rhinitis. No signs of infection  Rec: Regular use of Flonase , Nasonex or similar nasal steroid spray  2 sprays each nostril q d          Saline nasal spray prn          Antihistamine such as claritin or allegra q d          Can follow up in my office as an outpatient if needed   [phone]           Can occasionally use decongestant spray at night if having trouble breathing but no more than 3-4 times per week

## 2013-08-13 NOTE — Progress Notes (Addendum)
Unable to void, bladder scanned for 612 cc, no complaints of abd pressure, MD made aware and order noted.

## 2013-08-14 ENCOUNTER — Encounter (HOSPITAL_COMMUNITY): Payer: Self-pay | Admitting: Physician Assistant

## 2013-08-14 ENCOUNTER — Other Ambulatory Visit: Payer: Self-pay | Admitting: Physician Assistant

## 2013-08-14 DIAGNOSIS — N184 Chronic kidney disease, stage 4 (severe): Secondary | ICD-10-CM | POA: Diagnosis present

## 2013-08-14 DIAGNOSIS — N289 Disorder of kidney and ureter, unspecified: Secondary | ICD-10-CM

## 2013-08-14 LAB — BASIC METABOLIC PANEL
BUN: 41 mg/dL — ABNORMAL HIGH (ref 6–23)
CO2: 31 mEq/L (ref 19–32)
Calcium: 9 mg/dL (ref 8.4–10.5)
Chloride: 91 mEq/L — ABNORMAL LOW (ref 96–112)
Creatinine, Ser: 1.93 mg/dL — ABNORMAL HIGH (ref 0.50–1.35)
GFR calc Af Amer: 33 mL/min — ABNORMAL LOW (ref >90)
GFR calc non Af Amer: 28 mL/min — ABNORMAL LOW (ref >90)
Glucose, Bld: 139 mg/dL — ABNORMAL HIGH (ref 70–99)
Potassium: 3.5 mEq/L — ABNORMAL LOW (ref 3.7–5.3)
Sodium: 134 mEq/L — ABNORMAL LOW (ref 137–147)

## 2013-08-14 LAB — GLUCOSE, CAPILLARY: GLUCOSE-CAPILLARY: 137 mg/dL — AB (ref 70–99)

## 2013-08-14 MED ORDER — ISOSORBIDE DINITRATE 20 MG PO TABS: 20.0000 mg | ORAL_TABLET | Freq: Two times a day (BID) | ORAL | Status: DC

## 2013-08-14 MED ORDER — HYDRALAZINE HCL 25 MG PO TABS: 25.0000 mg | ORAL_TABLET | Freq: Three times a day (TID) | ORAL | Status: DC

## 2013-08-14 MED ORDER — TORSEMIDE 20 MG PO TABS: 80.0000 mg | ORAL_TABLET | Freq: Every day | ORAL | Status: DC

## 2013-08-14 MED ORDER — FLUTICASONE PROPIONATE 50 MCG/ACT NA SUSP: 2.0000 | Freq: Every day | NASAL | Status: DC

## 2013-08-14 NOTE — Progress Notes (Signed)
    SUBJECTIVE:  He still feels weak. No acute SOB.  He does have cough but it is better.    PHYSICAL EXAM Filed Vitals:   08/13/13 1343 08/13/13 2200 08/14/13 0500 08/14/13 0526  BP: 121/56 132/57  137/62  Pulse: 65 56  66  Temp: 98.5 F (36.9 C) 98.3 F (36.8 C)  98.1 F (36.7 C)  TempSrc: Oral Oral  Oral  Resp: 18 18  20   Height:      Weight:   191 lb 9.3 oz (86.9 kg)   SpO2: 97% 96%  99%   General:  No distress Lungs:  Decerased breath sounds without crackles Heart:  RRR Extremities:  No LE edema   LABS:  Results for orders placed during the hospital encounter of 08/03/13 (from the past 24 hour(s))  GLUCOSE, CAPILLARY     Status: Abnormal   Collection Time    08/13/13 10:54 AM      Result Value Ref Range   Glucose-Capillary 247 (*) 70 - 99 mg/dL  GLUCOSE, CAPILLARY     Status: Abnormal   Collection Time    08/13/13  4:33 PM      Result Value Ref Range   Glucose-Capillary 254 (*) 70 - 99 mg/dL  GLUCOSE, CAPILLARY     Status: Abnormal   Collection Time    08/13/13  9:53 PM      Result Value Ref Range   Glucose-Capillary 138 (*) 70 - 99 mg/dL  BASIC METABOLIC PANEL     Status: Abnormal   Collection Time    08/14/13  4:05 AM      Result Value Ref Range   Sodium 134 (*) 137 - 147 mEq/L   Potassium 3.5 (*) 3.7 - 5.3 mEq/L   Chloride 91 (*) 96 - 112 mEq/L   CO2 31  19 - 32 mEq/L   Glucose, Bld 139 (*) 70 - 99 mg/dL   BUN 41 (*) 6 - 23 mg/dL   Creatinine, Ser 1.93 (*) 0.50 - 1.35 mg/dL   Calcium 9.0  8.4 - 10.5 mg/dL   GFR calc non Af Amer 28 (*) >90 mL/min   GFR calc Af Amer 33 (*) >90 mL/min  GLUCOSE, CAPILLARY     Status: Abnormal   Collection Time    08/14/13  6:18 AM      Result Value Ref Range   Glucose-Capillary 137 (*) 70 - 99 mg/dL    Intake/Output Summary (Last 24 hours) at 08/14/13 0756 Last data filed at 08/14/13 0529  Gross per 24 hour  Intake    480 ml  Output   1001 ml  Net   -521 ml     ASSESSMENT AND PLAN:  Acute on chronic  diastolic HF:   Net negative 11.6 L sincee admission. Torsemide wason hold due to possible over diuresis. Creat is improved.  He should go home with Torsemide 40 bid but he will need a TOC appt to adjust.    Hypokalemic: repleted with supplemental K  Urinary Retention:   He will need to go home with a Foley and follow up with urology.    Right lung mass:  Conservative management only. No inpatient w/u.   PAF:  NSR.  Telemetry discontinued  Disposition:  Home today.  TOC within 7 days with Abbie [facility] 08/14/2013 7:56 AM

## 2013-08-14 NOTE — Progress Notes (Signed)
Pt d/c home.  A&o x 4.  No c/o pain.  IV d/c.  Education given on diet, activity, meds, and follow-up care and appointments.  Pt and caregiver verbalized understanding.

## 2013-08-14 NOTE — Discharge Summary (Signed)
CARDIOLOGY DISCHARGE SUMMARY   Patient ID: JARRED PURTEE MRN: [id number] DOB/AGE: 1918-11-10 80 y.o.  Admit date: 08/03/2013 Discharge date: 08/14/2013  PCP: [name], MD Primary Cardiologist: [name]  Primary Discharge Diagnosis:  Acute on chronic diastolic congestive heart failure, NYHA class 4 - weight 188 at discharge Secondary Discharge Diagnosis:    Chronic anticoagulation   PAF   Urinary retention, now with foley cath and leg bag.   Acute on chronic renal failure   Hilar lymphadenopathy   CKD (chronic kidney disease) stage 4, GFR 15-29 ml/min   Anemia   Hyponatremia   Hypokalemia   Acute respiratory failure  Consults: CCM, ENT, Urology (by phone), physical therapy  Procedures: Nasal endoscopy, CT chest w/o contrast, right jugular central venous catheter  Hospital Course: PERLEY ARTHURS is a 80 y.o. male with a history of CHF, DM, HL, CAD, HTN, CVA, CKD, PAF (on Eliquis), and CHB w/ PPM. He developed SOB and cough. He came to the ER and was admitted for further evaluation and treatment.  1. CHF/acute respiratory failure : He was initially diuresed with bolus Lasix but this did not work well. He had an acute CHF exacerbation and hypertensive episode the night of admission and required BiPAP. He did not require intubation. A pulmonary consult was called.He was started on a Lasix drip and a nitroglycerin drip. He improved within 48 hours and the BiPAP plus a nitroglycerin drip were both discontinued.  His renal function worsened and the Lasix was discontinued. He was given Zaroxolyn as well and this was discontinued on 3/26 along with the Lasix. He was changed to torsemide tablets, initially 80 mg twice a day. However,the dose required adjustment because of his renal function. The discharge dose is 80 mg daily. He is to continue daily weights.  2. Hypokalemia/hyponatremia: His electrolytes and renal function were followed closely during his hospital stay. He  required extensive potassium supplementation, more so during the period of time he was given Zaroxolyn. His potassium was supplemented on the day of discharge, and he is to be on 20 mEq daily.  3. Urinary retention: He had problems with urinary retention and a Foley catheter was inserted. The catheter was removed but the patient still had some problems voiding. Dr. Janice Norrie was contacted by phone and and advised to leave the catheter in. He will make the patient a followup appointment next week.  4. Acute on chronic renal failure/chronic kidney disease stage IV: Dr. Macky Lower renal function had been stage IV for months. With diuresis, his BUN and creatinine increased, peaking at 50/2.67 with a GFR of 19. With the adjustments in his medications, his BUN and creatinine improved, discharge labs are below. He will get a followup lab test next week.   5. Hilar lymphadenopathy: A followup chest x-ray showed a possible hilar density on the right. This was not seen earlier films. A CT of the chest without contrast was performed to further evaluate this. The results are below. He had a right lower lobe pulmonary nodule. Because of his age and the size, there was concern for malignancy. There is also a possibility it is inflammatory from recent pneumonia. He is to followup with Dr. Lake Bells at as an outpatient and get a CT of the chest at that time. Further evaluation will be per Dr. Lake Bells.  6. PAF: He has a history of PAF but was generally paced during his hospital stay. However, because of the PAF, he was on Eliquis prior  to admission. This was continued and he is to maintain anticoagulation as an outpatient.  7. Anemia: he was mildly anemic on admission, with a hemoglobin of 12.2. This decreased some with the frequency of blood draws, but had begun to improve prior to discharge. His white count is slightly low at discharge and this is a new finding. This will be rechecked as an outpatient by his primary care  physician.  8. Thrombocytopenia: He had mild thrombocytopenia with a platelet count of 148 at discharge. His platelet count has been low in the past, but has never been associated with bleeding problems. The lowest platelet count seen is 129, in December 2014.  9. Nasal congestion: Dr. Kennon Holter complained of nasal congestion with facial and sinus pressure, possibly interfering with sleep. An ENT consult was called and he had a nasal endoscopy. He was felt to have reactive rhinitis or allergic rhinitis but no signs of infection. A steroid spray, saline nasal spray and antihistamines were recommended.  On 4/3, Dr. Kennon Holter was seen by Dr. Percival Spanish and medications were reviewed by [name]. Other data were reviewed as well. No further inpatient workup is indicated and he is considered stable for discharge, to follow up as an outpatient.  Labs: Lab Results  Component Value Date   WBC 3.6* 08/07/2013   HGB 10.2* 08/07/2013   HCT 29.7* 08/07/2013   MCV 97.7 08/07/2013   PLT 148* 08/07/2013     Recent Labs Lab 08/14/13 0405  NA 134*  K 3.5*  CL 91*  CO2 31  BUN 41*  CREATININE 1.93*  CALCIUM 9.0  GLUCOSE 139*   Pro B Natriuretic peptide (BNP)  Date/Time Value Ref Range Status  08/03/2013  6:29 AM 2968.0* 0 - 450 pg/mL Final  06/18/2013 12:44 PM 3943.0* 0 - 450 pg/mL Final    Radiology: Ct Chest Wo Contrast 08/10/2013   CLINICAL DATA:  Cough.  Evaluate for infiltrates.  EXAM: CT CHEST WITHOUT CONTRAST  TECHNIQUE: Multidetector CT imaging of the chest was performed following the standard protocol without IV contrast.  COMPARISON:  Prior radiograph from 08/07/2013.  FINDINGS: The visualized thyroid is somewhat enlarged and heterogeneous in appearance with substernal extension of the left thyroid lobe. Shotty mediastinal adenopathy noted. No pathologically enlarged mediastinal, hilar, or axillary lymph nodes are identified.  The intrathoracic aorta is of normal caliber. Prominent atherosclerotic  calcifications seen within the aortic arch and at the origin of the great vessels.  Left-sided transvenous pacemaker/AICD noted. Mild cardiomegaly present. Diffuse 3 vessel coronary artery calcifications with aortic valvular calcifications noted. Trace pericardial effusion is present.  Small layering bilateral pleural effusions are present, right slightly greater than left. No focal airspace consolidation to suggest acute infectious pneumonitis is identified. Mild subsegmental atelectasis seen dependently within the lower lobes bilaterally. No overt interlobular septal thickening to suggest pulmonary edema. There is no pneumothorax.  A round nodule measuring 1.1 x 1.1 x 1.1 cm is seen within the right lower lobe just above the right hemidiaphragm (series 3, image 48), indeterminate. No other pulmonary nodule or mass identified.  Limited noncontrast evaluation of the upper abdomen is within normal limits. Small hiatal hernia noted.  No acute osseous abnormality. No worrisome lytic or blastic osseous lesions. Small sclerotic focus within the anterior left 6 for likely represents small bone island. Multilevel degenerative changes noted within the visualized spine.  IMPRESSION: 1. No focal infiltrates identified to suggest acute infectious pneumonitis. 2. Small bilateral pleural effusions, right slightly greater than left. 3. 1.1  x 1.1 x 1.1 cm nodule right lower lobe nodule as above. Finding is indeterminate, but worrisome for possible primary lung neoplasm. Further evaluation with PET-CT recommended. 4. Mild cardiomegaly with diffuse 3 vessel coronary artery calcifications and trace pericardial effusion. 5. Small hiatal hernia.   Electronically Signed   By: Jeannine Boga M.D.   On: 08/10/2013 13:46   Dg Chest Port 1 View 08/07/2013   CLINICAL DATA:  Post bronchoscopy and biopsy.  EXAM: PORTABLE CHEST - 1 VIEW  COMPARISON:  DG CHEST 1V PORT dated 08/05/2013; DG CHEST 1V PORT dated 08/04/2013; DG CHEST 2 VIEW  dated 06/20/2013; DG CHEST 1V PORT dated 06/14/2013; CT L SPINE W/O CM dated 04/30/2013; DG CHEST 1V PORT dated 08/03/2013  FINDINGS: Trachea is midline. Right IJ central line tip projects over the SVC. Heart size stable. Left subclavian pacemaker lead tips project over the right atrium and right ventricle.  There is right hilar fullness. Mild diffuse bilateral interstitial prominence and indistinctness. Confluent opacity in the right infrahilar region. No definite pneumothorax. No pleural fluid.  IMPRESSION: 1. No definite pneumothorax after bronchoscopy. 2. Right hilar prominence with right infrahilar opacification. Findings correspond to a reported history of a right lung mass and right hilar adenopathy, when admission history and physical 07/31/2013 is reviewed. 3. Mild diffuse interstitial prominence and indistinctness, possibly due to edema.   Electronically Signed   By: Lorin Picket M.D.   On: 08/07/2013 08:23   Dg Chest Port 1 View 08/05/2013   CLINICAL DATA:  Shortness of breath, assess endotracheal tube placement  EXAM: PORTABLE CHEST - 1 VIEW  COMPARISON:  DG CHEST 1V PORT dated 08/04/2013  FINDINGS: An endotracheal tube if present is not visible on this study. The lungs are adequately inflated. The interstitial markings are more prominent bilaterally. Increased density at the left lung base with obscuration of the hemidiaphragm persists. The cardiopericardial silhouette is top-normal in size. The pulmonary vascularity is mildly prominent centrally though stable. There is no pneumothorax or pneumomediastinum. The right internal jugular venous catheter tip lies in the region of the midportion of the SVC and is unchanged. The permanent pacemaker appears unchanged in position.  IMPRESSION: 1. An endotracheal tube, if present, is not visible on this study. 2. The lungs remain well expanded but the pulmonary interstitial markings have increased further bilaterally. This likely reflects interstitial edema which  may be of cardiac or noncardiac cause.   Electronically Signed   By: David  Martinique   On: 08/05/2013 08:41   Dg Chest Port 1 View 08/04/2013   CLINICAL DATA:  Status post central line placement  EXAM: PORTABLE CHEST - 1 VIEW  COMPARISON:  DG CHEST 1V PORT dated 08/03/2013  FINDINGS: The patient has undergone placement of a right internal jugular venous catheter. The tip lies in the region of the junction of proximal and midportions of the SVC. There is no evidence for postprocedure pneumothorax or pneumothorax.  The lungs are adequately inflated. The interstitial markings are prominent at both lung bases but overall have improved since yesterday's study. The cardiac silhouette is normal in size. The pulmonary vascularity is less engorged today. The permanent pacemaker is unchanged in appearance.  IMPRESSION: 1. There is no evidence of a postprocedure complication following placement of a right internal jugular venous catheter. 2. There has been mild interval improvement in the appearance of the pulmonary interstitium since yesterday's study. Bibasilar atelectasis persists.   Electronically Signed   By: David  Martinique   On: 08/04/2013 13:25  Dg Chest Portable 1 View 08/03/2013   CLINICAL DATA:  Productive cough.  Shortness of breath.  EXAM: PORTABLE CHEST - 1 VIEW  COMPARISON:  Chest x-ray 06/20/2013.  FINDINGS: Extensive interstitial prominence and patchy bibasilar airspace disease, concerning for bronchitis and probable multilobar bronchopneumonia. Small bilateral pleural effusions. No definite cephalization of the pulmonary vasculature. Heart size is within normal limits. Upper mediastinal contours are unremarkable. Left-sided pacemaker device in position with lead tips projecting over the expected location of the right atrium and right ventricular apex.  IMPRESSION: 1. The appearance of the chest is concerning for bronchitis and multilobar bronchopneumonia, most pronounced throughout the lung bases bilaterally.  2. Small bilateral pleural effusions.   Electronically Signed   By: Vinnie Langton M.D.   On: 08/03/2013 06:41   EKG:  AV pacing  FOLLOW UP PLANS AND APPOINTMENTS Allergies  Allergen Reactions  . Aggrenox [Aspirin-Dipyridamole Er] Other (See Comments)    Dizziness  . Carvedilol Other (See Comments)    Dizziness  . Claritin [Loratadine] Other (See Comments)    Dizziness & drowsiness   . Mucinex [Guaifenesin Er] Other (See Comments)    Dizziness, drowsiness  . Plavix [Clopidogrel Bisulfate] Other (See Comments)    Dizziness  . Rapaflo [Silodosin] Other (See Comments)    Dizziness for about 30- 45  minutes  . Statins Other (See Comments)    rhabdomyolisis  . Travatan Z [Travoprost] Other (See Comments)    Eye irritation     Medication List         allopurinol 100 MG tablet  Commonly known as:  ZYLOPRIM  Take 100 mg by mouth at bedtime.     amiodarone 200 MG tablet  Commonly known as:  PACERONE  Take 200 mg by mouth daily.     dutasteride 0.5 MG capsule  Commonly known as:  AVODART  Take 0.5 mg by mouth daily.     ELIQUIS 2.5 MG Tabs tablet  Generic drug:  apixaban  Take 2.5 mg by mouth 2 (two) times daily.     ferrous sulfate 325 (65 FE) MG tablet  Take 325 mg by mouth every other day.     fluticasone 50 MCG/ACT nasal spray  Commonly known as:  FLONASE  Place 2 sprays into both nostrils daily.     hydrALAZINE 25 MG tablet  Commonly known as:  APRESOLINE  Take 1 tablet (25 mg total) by mouth every 8 (eight) hours.     insulin glargine 100 UNIT/ML injection  Commonly known as:  LANTUS  Inject 11-12 Units into the skin at bedtime.     insulin lispro 100 UNIT/ML injection  Commonly known as:  HUMALOG  Inject 2-9 Units into the skin 3 (three) times daily before meals. 9 units with breakfast, 2-3 units at lunch & 9 units after supper     isosorbide dinitrate 20 MG tablet  Commonly known as:  ISORDIL  Take 1 tablet (20 mg total) by mouth 2 (two) times daily.       metoprolol tartrate 25 MG tablet  Commonly known as:  LOPRESSOR  Take 25 mg by mouth daily.     multivitamin with minerals Tabs tablet  Take 1 tablet by mouth daily. Diabetic pak by Petra Kuba from [employer]     polyethylene glycol packet  Commonly known as:  MIRALAX / GLYCOLAX  Take 17 g by mouth daily as needed for mild constipation. For constipation     potassium chloride SA 20 MEQ tablet  Commonly known  as:  K-DUR,KLOR-CON  Take 20 mEq by mouth daily.     REFRESH OP  Place 1 drop into both eyes 2 (two) times daily. Refresh Gel Opth     silodosin 8 MG Caps capsule  Commonly known as:  RAPAFLO  Take 8 mg by mouth daily after supper.     SYSTANE 0.4-0.3 % Soln  Generic drug:  Polyethyl Glycol-Propyl Glycol  Apply 1 drop to eye daily. Into affected eyes     torsemide 20 MG tablet  Commonly known as:  DEMADEX  Take 4 tablets (80 mg total) by mouth daily. Take 80mg  in the morning and 60mg  in the evening     ZIOPTAN 0.0015 % Soln  Generic drug:  Tafluprost  Place 1 drop into both eyes at bedtime. Use at night        Discharge Orders   Future Orders Complete By Expires   Diet - low sodium heart healthy  As directed    Increase activity slowly  As directed      Follow-up Information   Follow up with Simonne Maffucci, MD In 1 month. (pulmonary nodule)    Specialty:  Pulmonary Disease   Contact information:   [address] [place] [address] [phone]       Follow up with Nesi, Kirtland Bouchard, MD. (The office will call you. )    Specialty:  Urology   Contact information:   [place] [address] [phone]       Follow up with [name], MD. (The office will call next week.)    Specialty:  Cardiology   Contact information:   [address] [place] [address] [phone]       Follow up with Melony Overly, MD. (As needed)    Specialty:  Otolaryngology   Contact information:    [place] [address] [phone]       BRING ALL MEDICATIONS WITH YOU TO FOLLOW UP APPOINTMENTS  Time spent with patient to include physician time: 58 min Signed: Rosaria Ferries, PA-C 08/14/2013, 11:19 AM Co-Sign MD  Patient seen and examined.  Plan as discussed in my rounding note for today and outlined above. Minus Breeding  08/14/2013  2:48 PM

## 2013-08-14 NOTE — Progress Notes (Addendum)
I would like torsemide 80 mg daily rather than BID. I reconciled the meds the way I would like.  Needs to see me in 5-7 days with BMET.

## 2013-08-17 ENCOUNTER — Telehealth: Payer: Self-pay

## 2013-08-17 ENCOUNTER — Other Ambulatory Visit: Payer: Self-pay | Admitting: *Deleted

## 2013-08-17 DIAGNOSIS — I5032 Chronic diastolic (congestive) heart failure: Secondary | ICD-10-CM

## 2013-08-17 MED ORDER — METOPROLOL TARTRATE 25 MG PO TABS: 25.0000 mg | ORAL_TABLET | Freq: Every day | ORAL | Status: DC

## 2013-08-17 NOTE — Telephone Encounter (Signed)
pt aware of post hosp and bmet appt with Dr.Smith

## 2013-08-21 ENCOUNTER — Encounter: Payer: Self-pay | Admitting: Interventional Cardiology

## 2013-08-21 ENCOUNTER — Ambulatory Visit (INDEPENDENT_AMBULATORY_CARE_PROVIDER_SITE_OTHER): Payer: Medicare Other | Admitting: Interventional Cardiology

## 2013-08-21 VITALS — BP 114/70 | HR 70 | Ht 67.0 in | Wt 195.0 lb

## 2013-08-21 DIAGNOSIS — Z7901 Long term (current) use of anticoagulants: Secondary | ICD-10-CM

## 2013-08-21 DIAGNOSIS — Z79899 Other long term (current) drug therapy: Secondary | ICD-10-CM

## 2013-08-21 DIAGNOSIS — I4891 Unspecified atrial fibrillation: Secondary | ICD-10-CM

## 2013-08-21 DIAGNOSIS — I480 Paroxysmal atrial fibrillation: Secondary | ICD-10-CM

## 2013-08-21 DIAGNOSIS — Z950 Presence of cardiac pacemaker: Secondary | ICD-10-CM

## 2013-08-21 DIAGNOSIS — N184 Chronic kidney disease, stage 4 (severe): Secondary | ICD-10-CM

## 2013-08-21 DIAGNOSIS — I5032 Chronic diastolic (congestive) heart failure: Secondary | ICD-10-CM

## 2013-08-21 DIAGNOSIS — E119 Type 2 diabetes mellitus without complications: Secondary | ICD-10-CM

## 2013-08-21 LAB — BASIC METABOLIC PANEL
BUN: 43 mg/dL — ABNORMAL HIGH (ref 6–23)
CO2: 30 meq/L (ref 19–32)
Calcium: 9.5 mg/dL (ref 8.4–10.5)
Chloride: 95 mEq/L — ABNORMAL LOW (ref 96–112)
Creatinine, Ser: 2.6 mg/dL — ABNORMAL HIGH (ref 0.4–1.5)
GFR: 30.3 mL/min — ABNORMAL LOW (ref >60.00)
Glucose, Bld: 309 mg/dL — ABNORMAL HIGH (ref 70–99)
Potassium: 5.1 mEq/L (ref 3.5–5.1)
SODIUM: 135 meq/L (ref 135–145)

## 2013-08-21 MED ORDER — HYDRALAZINE HCL 25 MG PO TABS: ORAL_TABLET | ORAL | Status: DC

## 2013-08-21 NOTE — Progress Notes (Signed)
Patient ID: [name], male   DOB: Aug 19, 1918, 80 y.o.   MRN: [id number]    [address], [address] [place], [place]  [address] Phone: [phone] Fax:  [phone]  Date:  08/21/2013   ID:  [name], DOB [birth year], MRN [id number]  PCP:  [name], MD   ASSESSMENT:  1. Chronic diastolic heart failure, acute component resolve per weeks very stable since discharge from the hospital 2. Chronic, recurring aggravating cough without phlegm production 3. History of atrial fibrillation, with controlled rate and rhythm  4. Amiodarone therapy, without obvious side effects 5. Hypertension, controlled 6. Anticoagulation therapy, Eliquis 7. Chronic kidney disease, stage III-IV PLAN:  1. Basic metabolic panel 2. Hydralazine 50 mg a.m. 25 mg p.m. 3. Clinical followup in 2 weeks 4. Call if increase in weight greater than 3 pounds in any 24-hour period 5. Call of chest pain or irregular heart rate   SUBJECTIVE: [name] is a 80 y.o. male who is doing much better. Dyspnea has resolved. Dry hacking cough is very aggravating. No chills or fever. Appetite is improved. No lower extremity swelling. Weight is been stable.   Wt Readings from Last 3 Encounters:  08/21/13 195 lb (88.451 kg)  08/14/13 188 lb 11.2 oz (85.594 kg)  07/02/13 190 lb 3.2 oz (86.274 kg)     Past Medical History  Diagnosis Date  . Diabetes mellitus   . Hyperlipidemia   . Coronary artery disease   . Spinal stenosis   . Pneumonia   . Pacemaker   . Anemia   . Carotid artery disease   . PVD (peripheral vascular disease)   . Hyponatremia     Chronic  . CVA (cerebral infarction)   . CKD (chronic kidney disease) stage 4, GFR 15-29 ml/min   . Long term (current) use of anticoagulants     No bleeding on Eliquis  . Dysrhythmia   . PSVT (paroxysmal supraventricular tachycardia)   . PAF (paroxysmal atrial fibrillation)     Asymptomatic. On Eliquis.  . AV block, 3rd degree     With DDD St. Jude  PM, initially placed in 1993 by Dr. Nils Pyle. Device upgrade 10/2002 to DDD, by Dr. Rollene Fare complicated by bleeding.  . Essential hypertension, benign   . CHF (congestive heart failure)   . Chronic diastolic congestive heart failure     ECHO 05/20/12 LVEF estimated by 2D at 55-60%  . Arthritis     Current Outpatient Prescriptions  Medication Sig Dispense Refill  . allopurinol (ZYLOPRIM) 100 MG tablet Take 100 mg by mouth at bedtime.      [name] amiodarone (PACERONE) 200 MG tablet Take 200 mg by mouth daily.      [name] apixaban (ELIQUIS) 2.5 MG TABS tablet Take 2.5 mg by mouth 2 (two) times daily.      [name] dutasteride (AVODART) 0.5 MG capsule Take 0.5 mg by mouth daily.       . ferrous sulfate 325 (65 FE) MG tablet Take 325 mg by mouth every other day.       . hydrALAZINE (APRESOLINE) 25 MG tablet Take 1 tablet (25 mg total) by mouth every 8 (eight) hours.  90 tablet  11  . insulin glargine (LANTUS) 100 UNIT/ML injection Inject 11-12 Units into the skin at bedtime.       . insulin lispro (HUMALOG) 100 UNIT/ML injection Inject 2-9 Units into the skin 3 (three) times daily before meals. 9 units with breakfast, 2-3 units at  lunch & 9 units after supper      . isosorbide dinitrate (ISORDIL) 20 MG tablet Take 1 tablet (20 mg total) by mouth 2 (two) times daily.  60 tablet  11  . metoprolol tartrate (LOPRESSOR) 25 MG tablet Take 1 tablet (25 mg total) by mouth daily.  30 tablet  2  . Multiple Vitamin (MULTIVITAMIN WITH MINERALS) TABS Take 1 tablet by mouth daily. Diabetic pak by Petra Kuba from [employer]      . Polyethyl Glycol-Propyl Glycol (SYSTANE) 0.4-0.3 % SOLN Apply 1 drop to eye daily. Into affected eyes      . polyethylene glycol (MIRALAX / GLYCOLAX) packet Take 17 g by mouth daily as needed for mild constipation. For constipation      . Polyvinyl Alcohol-Povidone (REFRESH OP) Place 1 drop into both eyes 2 (two) times daily. Refresh Gel Opth      . potassium chloride SA (K-DUR,KLOR-CON) 20 MEQ tablet Take 20  mEq by mouth daily.      . silodosin (RAPAFLO) 8 MG CAPS capsule Take 8 mg by mouth daily after supper.       . Tafluprost (ZIOPTAN) 0.0015 % SOLN Place 1 drop into both eyes at bedtime. Use at night      . torsemide (DEMADEX) 20 MG tablet Take 80 mg by mouth daily. Take 80mg  in the morning total of 4 pills.       No current facility-administered medications for this visit.    Allergies:    Allergies  Allergen Reactions  . Aggrenox [Aspirin-Dipyridamole Er] Other (See Comments)    Dizziness  . Carvedilol Other (See Comments)    Dizziness  . Claritin [Loratadine] Other (See Comments)    Dizziness & drowsiness   . Mucinex [Guaifenesin Er] Other (See Comments)    Dizziness, drowsiness  . Plavix [Clopidogrel Bisulfate] Other (See Comments)    Dizziness  . Rapaflo [Silodosin] Other (See Comments)    Dizziness for about 30- 45  minutes  . Statins Other (See Comments)    rhabdomyolisis  . Travatan Z [Travoprost] Other (See Comments)    Eye irritation    Social History:  The patient  reports that he has never smoked. He has never used smokeless tobacco. He reports that he does not drink alcohol or use illicit drugs.   ROS:  Please see the history of present illness.   Denies chills, fever, diarrhea, abdominal pain   All other systems reviewed and negative.   OBJECTIVE: VS:  BP 114/70  Pulse 70  Ht 5\' 7"  (1.702 m)  Wt 195 lb (88.451 kg)  BMI 30.53 kg/m2 Well nourished, well developed, in no acute distress, elderly HEENT: normal Neck: JVD flat. Carotid bruit absent  Cardiac:  normal S1, S2; RRR; no murmur Lungs:  clear to auscultation bilaterally, no wheezing, rhonchi or rales Abd: soft, nontender, no hepatomegaly Ext: Edema trace. Pulses 2+ Skin: warm and dry Neuro:  CNs 2-12 intact, no focal abnormalities noted  EKG:  Not repeated       Signed, Illene Labrador III, MD 08/21/2013 10:21 AM

## 2013-08-21 NOTE — Patient Instructions (Signed)
Your physician has recommended you make the following change in your medication: 1. With you Hydralazine take 50 MG in the AM and 25 MG in the evening  Your physician recommends that you go to the lab today for a BMET  Your physician recommends that you schedule a follow-up appointment in: 2 Weeks with Dr [name]

## 2013-08-25 ENCOUNTER — Telehealth: Payer: Self-pay

## 2013-08-25 MED ORDER — POTASSIUM CHLORIDE CRYS ER 20 MEQ PO TBCR: 10.0000 meq | EXTENDED_RELEASE_TABLET | Freq: Every day | ORAL | Status: DC

## 2013-08-25 NOTE — Telephone Encounter (Signed)
[name] pt care giver given pt lab results and Dr.Smith's instructions.Decrease the potassium to 1/2 tab(10 meq) daily.she verbalized understanding.

## 2013-08-25 NOTE — Telephone Encounter (Signed)
Message copied by Lamar Laundry on Tue Aug 25, 2013  9:59 AM ------      Message from: Daneen Schick      Created: Fri Aug 21, 2013  2:38 PM       Decrease the potassium to 1/2 tab(10 meq) daily ------

## 2013-08-26 NOTE — Telephone Encounter (Signed)
Please close this encounter

## 2013-08-27 ENCOUNTER — Telehealth: Payer: Self-pay | Admitting: Interventional Cardiology

## 2013-08-27 NOTE — Telephone Encounter (Signed)
Follow up report      Margaretha Sheffield with [facility] called to report pt has not been communicating and is noncompliant.    Margaretha Sheffield reported son call them back an stated pt has gone back to work.

## 2013-08-28 NOTE — Telephone Encounter (Signed)
Pt scheduled for 09/18/13 @11 :30.  Nothing further needed.

## 2013-09-01 NOTE — Telephone Encounter (Signed)
Will fwd FYI to Dr.Smith 

## 2013-09-03 ENCOUNTER — Encounter: Payer: Self-pay | Admitting: Interventional Cardiology

## 2013-09-03 ENCOUNTER — Ambulatory Visit (INDEPENDENT_AMBULATORY_CARE_PROVIDER_SITE_OTHER): Payer: Medicare Other | Admitting: Interventional Cardiology

## 2013-09-03 VITALS — BP 132/60 | HR 76 | Ht 67.0 in | Wt 187.8 lb

## 2013-09-03 DIAGNOSIS — I10 Essential (primary) hypertension: Secondary | ICD-10-CM

## 2013-09-03 DIAGNOSIS — Z7901 Long term (current) use of anticoagulants: Secondary | ICD-10-CM

## 2013-09-03 DIAGNOSIS — Z79899 Other long term (current) drug therapy: Secondary | ICD-10-CM

## 2013-09-03 DIAGNOSIS — N184 Chronic kidney disease, stage 4 (severe): Secondary | ICD-10-CM

## 2013-09-03 DIAGNOSIS — I5032 Chronic diastolic (congestive) heart failure: Secondary | ICD-10-CM

## 2013-09-03 DIAGNOSIS — I480 Paroxysmal atrial fibrillation: Secondary | ICD-10-CM

## 2013-09-03 DIAGNOSIS — I4891 Unspecified atrial fibrillation: Secondary | ICD-10-CM

## 2013-09-03 NOTE — Progress Notes (Signed)
Patient ID: [name], male   DOB: Sep 19, 1918, 80 y.o.   MRN: [id number]    [address] [place], [place]  [address] Phone: [phone] Fax:  [phone]  Date:  09/03/2013   ID:  DIANNE BADY, DOB 1918/05/24, MRN [id number]  PCP:  [name], MD   ASSESSMENT:  1. Chronic diastolic heart failure, stable. Possibly mildly biotin contracted 2. Hypertension 3. Chronic kidney disease 4. Paroxysmal atrial fibrillation 5. Chronic anticoagulation  PLAN:  1. Continue current diuretic and medication regimen 2. The patient refused home health and physical therapy and is back at work seeing patients and the [facility] 3. Basic metabolic panel on return   SUBJECTIVE: [name] is a 80 y.o. male dyspnea has significantly improved. He is thirsty frequently. He has not had syncope or dizziness. His appetite is great. He is back in his office and seeing  Patients. He denies syncope. Is no lower extremity swelling. He denies chills and fever. The cough has significantly improved. His bowel movements have been regular and the Foley catheter has been extracted. His weight is been stable and generally less than 190 pounds.   Wt Readings from Last 3 Encounters:  09/03/13 187 lb 12.8 oz (85.186 kg)  08/21/13 195 lb (88.451 kg)  08/14/13 188 lb 11.2 oz (85.594 kg)     Past Medical History  Diagnosis Date  . Diabetes mellitus   . Hyperlipidemia   . Coronary artery disease   . Spinal stenosis   . Pneumonia   . Pacemaker   . Anemia   . Carotid artery disease   . PVD (peripheral vascular disease)   . Hyponatremia     Chronic  . CVA (cerebral infarction)   . CKD (chronic kidney disease) stage 4, GFR 15-29 ml/min   . Long term (current) use of anticoagulants     No bleeding on Eliquis  . Dysrhythmia   . PSVT (paroxysmal supraventricular tachycardia)   . PAF (paroxysmal atrial fibrillation)     Asymptomatic. On Eliquis.  . AV block, 3rd degree      With DDD St. Jude PM, initially placed in 1993 by Dr. Nils Pyle. Device upgrade 10/2002 to DDD, by Dr. Rollene Fare complicated by bleeding.  . Essential hypertension, benign   . CHF (congestive heart failure)   . Chronic diastolic congestive heart failure     ECHO 05/20/12 LVEF estimated by 2D at 55-60%  . Arthritis     Current Outpatient Prescriptions  Medication Sig Dispense Refill  . allopurinol (ZYLOPRIM) 100 MG tablet Take 100 mg by mouth at bedtime.      [name] amiodarone (PACERONE) 200 MG tablet Take 200 mg by mouth daily.      [name] apixaban (ELIQUIS) 2.5 MG TABS tablet Take 2.5 mg by mouth 2 (two) times daily.      [name] dutasteride (AVODART) 0.5 MG capsule Take 0.5 mg by mouth daily.       . ferrous sulfate 325 (65 FE) MG tablet Take 325 mg by mouth every other day.       . hydrALAZINE (APRESOLINE) 25 MG tablet Take 50 MG in the AM and 25 MG in the PM  90 tablet  11  . insulin glargine (LANTUS) 100 UNIT/ML injection Inject 11-12 Units into the skin at bedtime.       . insulin lispro (HUMALOG) 100 UNIT/ML injection Inject 2-9 Units into the skin 3 (three) times daily before meals. 9 units with breakfast,  2-3 units at lunch & 9 units after supper      . isosorbide dinitrate (ISORDIL) 20 MG tablet Take 1 tablet (20 mg total) by mouth 2 (two) times daily.  60 tablet  11  . metoprolol tartrate (LOPRESSOR) 25 MG tablet Take 1 tablet (25 mg total) by mouth daily.  30 tablet  2  . Multiple Vitamin (MULTIVITAMIN WITH MINERALS) TABS Take 1 tablet by mouth daily. Diabetic pak by Petra Kuba from [employer]      . Polyethyl Glycol-Propyl Glycol (SYSTANE) 0.4-0.3 % SOLN Apply 1 drop to eye daily. Into affected eyes      . polyethylene glycol (MIRALAX / GLYCOLAX) packet Take 17 g by mouth daily as needed for mild constipation. For constipation      . Polyvinyl Alcohol-Povidone (REFRESH OP) Place 1 drop into both eyes 2 (two) times daily. Refresh Gel Opth      . potassium chloride SA (K-DUR,KLOR-CON) 20 MEQ tablet Take  0.5 tablets (10 mEq total) by mouth daily.      . silodosin (RAPAFLO) 8 MG CAPS capsule Take 8 mg by mouth daily after supper.       . Tafluprost (ZIOPTAN) 0.0015 % SOLN Place 1 drop into both eyes at bedtime. Use at night      . torsemide (DEMADEX) 20 MG tablet Take 80 mg by mouth daily. Take 80mg  in the morning total of 4 pills.       No current facility-administered medications for this visit.    Allergies:    Allergies  Allergen Reactions  . Aggrenox [Aspirin-Dipyridamole Er] Other (See Comments)    Dizziness  . Carvedilol Other (See Comments)    Dizziness  . Claritin [Loratadine] Other (See Comments)    Dizziness & drowsiness   . Mucinex [Guaifenesin Er] Other (See Comments)    Dizziness, drowsiness  . Plavix [Clopidogrel Bisulfate] Other (See Comments)    Dizziness  . Rapaflo [Silodosin] Other (See Comments)    Dizziness for about 30- 45  minutes  . Statins Other (See Comments)    rhabdomyolisis  . Travatan Z [Travoprost] Other (See Comments)    Eye irritation    Social History:  The patient  reports that he has never smoked. He has never used smokeless tobacco. He reports that he does not drink alcohol or use illicit drugs.   ROS:  Please see the history of present illness.   Feels thirsty. Feels somewhat week if he stands too long. There is no orthopnea. He is sleeping well.   All other systems reviewed and negative.   OBJECTIVE: VS:  BP 132/60  Pulse 76  Ht 5\' 7"  (1.702 m)  Wt 187 lb 12.8 oz (85.186 kg)  BMI 29.41 kg/m2 Well nourished, well developed, in no acute distress, elderly HEENT: normal Neck: JVD flat. Carotid bruit absent  Cardiac:  normal S1, S2; RRR; no murmur Lungs:  clear to auscultation bilaterally, no wheezing, rhonchi or rales Abd: soft, nontender, no hepatomegaly Ext: Edema absent. Pulses 2+ and symmetric Skin: warm and dry Neuro:  CNs 2-12 intact, no focal abnormalities noted  EKG:  Not repeated       Signed, Illene Labrador III,  MD 09/03/2013 9:40 AM

## 2013-09-03 NOTE — Patient Instructions (Signed)
Your physician recommends that you continue on your current medications as directed. Please refer to the Current Medication list given to you today.  Your physician recommends that you return for lab work in: 1 month  You have a follow up appt scheduled on 10/06/13 @10am 

## 2013-09-05 ENCOUNTER — Emergency Department (HOSPITAL_COMMUNITY): Payer: Medicare Other

## 2013-09-05 ENCOUNTER — Encounter (HOSPITAL_COMMUNITY): Payer: Self-pay | Admitting: Emergency Medicine

## 2013-09-05 ENCOUNTER — Emergency Department (HOSPITAL_COMMUNITY)
Admission: EM | Admit: 2013-09-05 | Discharge: 2013-09-05 | Disposition: A | Discharge status: Home / Self Care | Payer: Medicare Other | Attending: Emergency Medicine | Admitting: Emergency Medicine

## 2013-09-05 ENCOUNTER — Other Ambulatory Visit: Payer: Self-pay

## 2013-09-05 DIAGNOSIS — I739 Peripheral vascular disease, unspecified: Secondary | ICD-10-CM | POA: Insufficient documentation

## 2013-09-05 DIAGNOSIS — M129 Arthropathy, unspecified: Secondary | ICD-10-CM | POA: Insufficient documentation

## 2013-09-05 DIAGNOSIS — I509 Heart failure, unspecified: Secondary | ICD-10-CM | POA: Insufficient documentation

## 2013-09-05 DIAGNOSIS — Z8701 Personal history of pneumonia (recurrent): Secondary | ICD-10-CM | POA: Insufficient documentation

## 2013-09-05 DIAGNOSIS — J9801 Acute bronchospasm: Secondary | ICD-10-CM | POA: Insufficient documentation

## 2013-09-05 DIAGNOSIS — Z8679 Personal history of other diseases of the circulatory system: Secondary | ICD-10-CM | POA: Insufficient documentation

## 2013-09-05 DIAGNOSIS — N184 Chronic kidney disease, stage 4 (severe): Secondary | ICD-10-CM | POA: Insufficient documentation

## 2013-09-05 DIAGNOSIS — Z9889 Other specified postprocedural states: Secondary | ICD-10-CM | POA: Insufficient documentation

## 2013-09-05 DIAGNOSIS — E785 Hyperlipidemia, unspecified: Secondary | ICD-10-CM | POA: Insufficient documentation

## 2013-09-05 DIAGNOSIS — Z8673 Personal history of transient ischemic attack (TIA), and cerebral infarction without residual deficits: Secondary | ICD-10-CM | POA: Insufficient documentation

## 2013-09-05 DIAGNOSIS — I2510 Atherosclerotic heart disease of native coronary artery without angina pectoris: Secondary | ICD-10-CM | POA: Insufficient documentation

## 2013-09-05 DIAGNOSIS — I129 Hypertensive chronic kidney disease with stage 1 through stage 4 chronic kidney disease, or unspecified chronic kidney disease: Secondary | ICD-10-CM | POA: Insufficient documentation

## 2013-09-05 DIAGNOSIS — E119 Type 2 diabetes mellitus without complications: Secondary | ICD-10-CM | POA: Insufficient documentation

## 2013-09-05 DIAGNOSIS — I779 Disorder of arteries and arterioles, unspecified: Secondary | ICD-10-CM | POA: Insufficient documentation

## 2013-09-05 DIAGNOSIS — Z862 Personal history of diseases of the blood and blood-forming organs and certain disorders involving the immune mechanism: Secondary | ICD-10-CM | POA: Insufficient documentation

## 2013-09-05 DIAGNOSIS — Z7901 Long term (current) use of anticoagulants: Secondary | ICD-10-CM | POA: Insufficient documentation

## 2013-09-05 DIAGNOSIS — Z79899 Other long term (current) drug therapy: Secondary | ICD-10-CM | POA: Insufficient documentation

## 2013-09-05 DIAGNOSIS — Z950 Presence of cardiac pacemaker: Secondary | ICD-10-CM | POA: Insufficient documentation

## 2013-09-05 DIAGNOSIS — M4800 Spinal stenosis, site unspecified: Secondary | ICD-10-CM | POA: Insufficient documentation

## 2013-09-05 DIAGNOSIS — R339 Retention of urine, unspecified: Secondary | ICD-10-CM | POA: Insufficient documentation

## 2013-09-05 LAB — BASIC METABOLIC PANEL
BUN: 37 mg/dL — AB (ref 6–23)
CHLORIDE: 99 meq/L (ref 96–112)
CO2: 27 mEq/L (ref 19–32)
Calcium: 9.5 mg/dL (ref 8.4–10.5)
Creatinine, Ser: 2.23 mg/dL — ABNORMAL HIGH (ref 0.50–1.35)
GFR calc non Af Amer: 24 mL/min — ABNORMAL LOW (ref >90)
GFR, EST AFRICAN AMERICAN: 28 mL/min — AB (ref >90)
GLUCOSE: 191 mg/dL — AB (ref 70–99)
POTASSIUM: 4.6 meq/L (ref 3.7–5.3)
Sodium: 141 mEq/L (ref 137–147)

## 2013-09-05 LAB — CBC WITH DIFFERENTIAL/PLATELET
Basophils Absolute: 0 10*3/uL (ref 0.0–0.1)
Basophils Relative: 0 % (ref 0–1)
Eosinophils Absolute: 0.1 10*3/uL (ref 0.0–0.7)
Eosinophils Relative: 1 % (ref 0–5)
HEMATOCRIT: 34 % — AB (ref 39.0–52.0)
HEMOGLOBIN: 11.1 g/dL — AB (ref 13.0–17.0)
Lymphocytes Relative: 20 % (ref 12–46)
Lymphs Abs: 1.4 10*3/uL (ref 0.7–4.0)
MCH: 32.4 pg (ref 26.0–34.0)
MCHC: 32.6 g/dL (ref 30.0–36.0)
MCV: 99.1 fL (ref 78.0–100.0)
MONOS PCT: 5 % (ref 3–12)
Monocytes Absolute: 0.3 10*3/uL (ref 0.1–1.0)
NEUTROS ABS: 5.1 10*3/uL (ref 1.7–7.7)
Neutrophils Relative %: 74 % (ref 43–77)
Platelets: 176 10*3/uL (ref 150–400)
RBC: 3.43 MIL/uL — ABNORMAL LOW (ref 4.22–5.81)
RDW: 15.1 % (ref 11.5–15.5)
WBC: 7 10*3/uL (ref 4.0–10.5)

## 2013-09-05 LAB — URIC ACID: Uric Acid, Serum: 3.9 mg/dL — ABNORMAL LOW (ref 4.0–7.8)

## 2013-09-05 LAB — URINALYSIS, ROUTINE W REFLEX MICROSCOPIC
Bilirubin Urine: NEGATIVE
Glucose, UA: NEGATIVE mg/dL
Hgb urine dipstick: NEGATIVE
Ketones, ur: NEGATIVE mg/dL
LEUKOCYTES UA: NEGATIVE
Nitrite: NEGATIVE
PH: 6 (ref 5.0–8.0)
Protein, ur: NEGATIVE mg/dL
SPECIFIC GRAVITY, URINE: 1.016 (ref 1.005–1.030)
Urobilinogen, UA: 0.2 mg/dL (ref 0.0–1.0)

## 2013-09-05 LAB — TROPONIN I: Troponin I: <0.3 ng/mL (ref <0.30)

## 2013-09-05 LAB — PRO B NATRIURETIC PEPTIDE: Pro B Natriuretic peptide (BNP): 2905 pg/mL — ABNORMAL HIGH (ref 0–450)

## 2013-09-05 MED ORDER — ALBUTEROL SULFATE (2.5 MG/3ML) 0.083% IN NEBU
2.5000 mg | INHALATION_SOLUTION | RESPIRATORY_TRACT | Status: DC | PRN
  Administered 2013-09-05: 2.5 mg via RESPIRATORY_TRACT
  Filled 2013-09-05 (×2): qty 3

## 2013-09-05 MED ORDER — HYDRALAZINE HCL 20 MG/ML IJ SOLN
5.0000 mg | Freq: Once | INTRAMUSCULAR | Status: DC
  Filled 2013-09-05: qty 1

## 2013-09-05 MED ORDER — FUROSEMIDE 10 MG/ML IJ SOLN
80.0000 mg | Freq: Once | INTRAMUSCULAR | Status: AC
  Administered 2013-09-05: 80 mg via INTRAVENOUS
  Filled 2013-09-05: qty 8

## 2013-09-05 MED ORDER — ALBUTEROL SULFATE HFA 108 (90 BASE) MCG/ACT IN AERS: 1.0000 | INHALATION_SPRAY | Freq: Four times a day (QID) | RESPIRATORY_TRACT | Status: DC | PRN

## 2013-09-05 NOTE — ED Notes (Signed)
IV site attempted x 2 RNs Unsuccessful. IV RN Paged

## 2013-09-05 NOTE — ED Notes (Signed)
MD at bedside. 

## 2013-09-05 NOTE — ED Notes (Signed)
Pt. Stated, I can't breath and Im unable to pass my urine, this started last night.

## 2013-09-05 NOTE — Discharge Instructions (Signed)
Dr. Kennon Holter, It was an honor to meet you, and my privilege to evaluate you today. -wear the leg bag/foley until you speak with your Urologist. -continue your torsemide and other medications as prescribed -continue the Zithromax from Dr. Carlis Abbott -I prescribed you an albuterol inhaler to use for any wheezing.  Heart Failure Heart failure means your heart has trouble pumping blood. This makes it hard for your body to work well. Heart failure is usually a long-term (chronic) condition. You must take good care of yourself and follow your doctor's treatment plan. HOME CARE  Take your heart medicine as told by your doctor.  Do not stop taking medicine unless your doctor tells you to.  Do not skip any dose of medicine.  Refill your medicines before they run out.  Take other medicines only as told by your doctor or pharmacist.  Stay active if told by your doctor. The elderly and people with severe heart failure should talk with a doctor about physical activity.  Eat heart healthy foods. Choose foods that are without trans fat and are low in saturated fat, cholesterol, and salt (sodium). This includes fresh or frozen fruits and vegetables, fish, lean meats, fat-free or low-fat dairy foods, whole grains, and high-fiber foods. Lentils and dried peas and beans (legumes) are also good choices.  Limit salt if told by your doctor.  Cook in a healthy way. Roast, grill, broil, bake, poach, steam, or stir-fry foods.  Limit fluids as told by your doctor.  Weigh yourself every morning. Do this after you pee (urinate) and before you eat breakfast. Write down your weight to give to your doctor.  Take your blood pressure and write it down if your doctor tell you to.  Ask your doctor how to check your pulse. Check your pulse as told.  Lose weight if told by your doctor.  Stop smoking or chewing tobacco. Do not use gum or patches that help you quit without your doctor's approval.  Schedule and go to  doctor visits as told.  Nonpregnant women should have no more than 1 drink a day. Men should have no more than 2 drinks a day. Talk to your doctor about drinking alcohol.  Stop illegal drug use.  Stay current with shots (immunizations).  Manage your health conditions as told by your doctor.  Learn to manage your stress.  Rest when you are tired.  If it is really hot outside:  Avoid intense activities.  Use air conditioning or fans, or get in a cooler place.  Avoid caffeine and alcohol.  Wear loose-fitting, lightweight, and light-colored clothing.  If it is really cold outside:  Avoid intense activities.  Layer your clothing.  Wear mittens or gloves, a hat, and a scarf when going outside.  Avoid alcohol.  Learn about heart failure and get support as needed.  Get help to maintain or improve your quality of life and your ability to care for yourself as needed. GET HELP IF:   You gain 03 lb/1.4 kg or more in 1 day or 05 lb/2.3 kg in a week.  You are more short of breath than usual.  You cannot do your normal activities.  You tire easily.  You cough more than normal, especially with activity.  You have any or more puffiness (swelling) in areas such as your hands, feet, ankles, or belly (abdomen).  You cannot sleep because it is hard to breathe.  You feel like your heart is beating fast (palpitations).  You get dizzy or  lightheaded when you stand up. GET HELP RIGHT AWAY IF:   You have trouble breathing.  There is a change in mental status, such as becoming less alert or not being able to focus.  You have chest pain or discomfort.  You faint. MAKE SURE YOU:   Understand these instructions.  Will watch your condition.  Will get help right away if you are not doing well or get worse. Document Released: 02/07/2008 Document Revised: 08/25/2012 Document Reviewed: 11/29/2011 [facility] Patient Information 2014 [place], [place].  Bronchospasm, Adult A  bronchospasm is a spasm or tightening of the airways going into the lungs. During a bronchospasm breathing becomes more difficult because the airways get smaller. When this happens there can be coughing, a whistling sound when breathing (wheezing), and difficulty breathing. Bronchospasm is often associated with asthma, but not all patients who experience a bronchospasm have asthma. CAUSES  A bronchospasm is caused by inflammation or irritation of the airways. The inflammation or irritation may be triggered by:   Allergies (such as to animals, pollen, food, or mold). Allergens that cause bronchospasm may cause wheezing immediately after exposure or many hours later.   Infection. Viral infections are believed to be the most common cause of bronchospasm.   Exercise.   Irritants (such as pollution, cigarette smoke, strong odors, aerosol sprays, and paint fumes).   Weather changes. Winds increase molds and pollens in the air. Rain refreshes the air by washing irritants out. Cold air may cause inflammation.   Stress and emotional upset.  SIGNS AND SYMPTOMS   Wheezing.   Excessive nighttime coughing.   Frequent or severe coughing with a simple cold.   Chest tightness.   Shortness of breath.  DIAGNOSIS  Bronchospasm is usually diagnosed through a history and physical exam. Tests, such as chest X-rays, are sometimes done to look for other conditions. TREATMENT   Inhaled medicines can be given to open up your airways and help you breathe. The medicines can be given using either an inhaler or a nebulizer machine.  Corticosteroid medicines may be given for severe bronchospasm, usually when it is associated with asthma. HOME CARE INSTRUCTIONS   Always have a plan prepared for seeking medical care. Know when to call your health care provider and local emergency services (911 in the [place] Know where you can access local emergency care.  Only take medicines as directed by your health  care provider.  If you were prescribed an inhaler or nebulizer machine, ask your health care provider to explain how to use it correctly. Always use a spacer with your inhaler if you were given one.  It is necessary to remain calm during an attack. Try to relax and breathe more slowly.  Control your home environment in the following ways:   Change your heating and air conditioning filter at least once a month.   Limit your use of fireplaces and wood stoves.  Do not smoke and do not allow smoking in your home.   Avoid exposure to perfumes and fragrances.   Get rid of pests (such as roaches and mice) and their droppings.   Throw away plants if you see mold on them.   Keep your house clean and dust free.   Replace carpet with wood, tile, or vinyl flooring. Carpet can trap dander and dust.   Use allergy-proof pillows, mattress covers, and box spring covers.   Wash bed sheets and blankets every week in hot water and dry them in a dryer.   Use blankets  that are made of polyester or cotton.   Wash hands frequently. SEEK MEDICAL CARE IF:   You have muscle aches.   You have chest pain.   The sputum changes from clear or white to yellow, green, gray, or bloody.   The sputum you cough up gets thicker.   There are problems that may be related to the medicine you are given, such as a rash, itching, swelling, or trouble breathing.  SEEK IMMEDIATE MEDICAL CARE IF:   You have worsening wheezing and coughing even after taking your prescribed medicines.   You have increased difficulty breathing.   You develop severe chest pain. MAKE SURE YOU:   Understand these instructions.  Will watch your condition.  Will get help right away if you are not doing well or get worse. Document Released: 05/03/2003 Document Revised: 12/31/2012 Document Reviewed: 10/20/2012 [facility] Patient Information 2014 [place].

## 2013-09-05 NOTE — ED Notes (Signed)
Catheter switched to leg bag.

## 2013-09-05 NOTE — ED Provider Notes (Signed)
CSN: [id number]     Arrival date & time 09/05/13  4270 History   First MD Initiated Contact with Patient 09/05/13 [phone]     Chief Complaint  Patient presents with  . Shortness of Breath  . 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 Jeanann Lewandowsky, and Cardiology [name].  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    He presents today with Urinary retention and dyspnea, starting at 11pm-MN last night.  He was unable to urinate all night, and cannot ley flat without dyspnea.  He has not taken his Hydralazine last night, or this morning.  His weight today is 189lbs, which is his baseline.  No palpitations.  No chest pain. Has had a cough, and was started on Z-Max yesterday by his PCP.  No fever.  Reports sinus congestion.   History of Diastolic CHF with EF 62-83%. Paroxysmal A. Fib with DDD Pacemaker.   Past Medical History  Diagnosis Date  . Diabetes mellitus   . Hyperlipidemia   . Coronary artery disease   . Spinal stenosis   . Pneumonia   . Pacemaker   . Anemia   . Carotid artery disease   . PVD (peripheral vascular disease)   . Hyponatremia     Chronic  . CVA (cerebral infarction)   . CKD (chronic kidney disease) stage 4, GFR 15-29 ml/min   . Long term (current) use of anticoagulants     No bleeding on Eliquis  . Dysrhythmia   . PSVT (paroxysmal supraventricular tachycardia)   . PAF (paroxysmal atrial fibrillation)     Asymptomatic. On Eliquis.  . AV block, 3rd degree     With DDD St. Jude PM, initially placed in 1993 by Dr. Nils Pyle. Device upgrade 10/2002 to DDD, by Dr. Rollene Fare complicated by bleeding.  . Essential hypertension, benign   . CHF (congestive heart failure)   . Chronic diastolic congestive heart failure     ECHO 05/20/12 LVEF estimated by 2D at 55-60%  . Arthritis    Past Surgical History  Procedure Laterality Date  . Lumbar laminectomy  1995  . Back  surgery    . Total knee arthroplasty Left   . Quadriceps tendon repair    . Cataract extraction    . Carotid endarterectomy    . Yag laser application Right 1/51/7616    Procedure: YAG LASER CAPSULOTOMY OF RIGHT EYE;  Surgeon: [name], MD;  Location: [facility];  Service: Ophthalmology;  Laterality: Right;  . Tonsillectomy    . Insert / replace / remove pacemaker      DDD St. Jude PM, initially placed in 1993 by Dr. Nils Pyle. Device upgrade 10/2002 to DDD, by Dr. Rollene Fare complicated by bleeding.  . Carotid endarterectomy Right 2006  . Transurethral resection of prostate    . Cardioversion N/A 06/16/2013    Procedure: CARDIOVERSION BEDSIDE;  Surgeon: [name], MD;  Location: [facility] OR;  Service: Cardiovascular;  Laterality: N/A;   Family History  Problem Relation Age of Onset  . Multiple myeloma Father   . Hypertension Mother    History  Substance Use Topics  . Smoking status: Never Smoker   . Smokeless tobacco: Never Used  . Alcohol Use: No     Comment: Cocktails occasionally    Review of Systems  Constitutional: Negative for fever, chills, diaphoresis, appetite change and fatigue.  HENT: Positive for congestion and rhinorrhea. Negative for mouth sores, sore throat and trouble swallowing.   Eyes: Negative for visual disturbance.  Respiratory: Positive for cough, shortness of breath and wheezing. Negative for chest tightness.   Cardiovascular: Negative for chest pain, palpitations and leg swelling.  Gastrointestinal: Negative for nausea, vomiting, abdominal pain, diarrhea and abdominal distention.  Endocrine: Negative for polydipsia, polyphagia and polyuria.  Genitourinary: Positive for decreased urine volume and difficulty urinating. Negative for dysuria, frequency and hematuria.  Musculoskeletal: Negative for gait problem.  Skin: Negative for color change, pallor and rash.  Neurological: Negative for dizziness, syncope, light-headedness and headaches.   Hematological: Does not bruise/bleed easily.  Psychiatric/Behavioral: Negative for behavioral problems and confusion.      Allergies  Aggrenox; Carvedilol; Claritin; Mucinex; Plavix; Rapaflo; Statins; and Travatan z  Home Medications   Prior to Admission medications   Medication Sig Start Date End Date Taking? Authorizing Provider  allopurinol (ZYLOPRIM) 100 MG tablet Take 100 mg by mouth at bedtime.    Historical Provider, MD  amiodarone (PACERONE) 200 MG tablet Take 200 mg by mouth daily. 06/22/13   Belva Crome III, MD  apixaban (ELIQUIS) 2.5 MG TABS tablet Take 2.5 mg by mouth 2 (two) times daily.    Historical Provider, MD  dutasteride (AVODART) 0.5 MG capsule Take 0.5 mg by mouth daily.     Historical Provider, MD  ferrous sulfate 325 (65 FE) MG tablet Take 325 mg by mouth every other day.     Historical Provider, MD  hydrALAZINE (APRESOLINE) 25 MG tablet Take 50 MG in the AM and 25 MG in the PM 08/21/13   Belva Crome III, MD  insulin glargine (LANTUS) 100 UNIT/ML injection Inject 11-12 Units into the skin at bedtime.     Historical Provider, MD  insulin lispro (HUMALOG) 100 UNIT/ML injection Inject 2-9 Units into the skin 3 (three) times daily before meals. 9 units with breakfast, 2-3 units at lunch & 9 units after supper    Historical Provider, MD  isosorbide dinitrate (ISORDIL) 20 MG tablet Take 1 tablet (20 mg total) by mouth 2 (two) times daily. 08/14/13   Belva Crome III, MD  metoprolol tartrate (LOPRESSOR) 25 MG tablet Take 1 tablet (25 mg total) by mouth daily. 08/17/13   Belva Crome III, MD  Multiple Vitamin (MULTIVITAMIN WITH MINERALS) TABS Take 1 tablet by mouth daily. Diabetic pak by Petra Kuba from [place] Provider, MD  Polyethyl Glycol-Propyl Glycol (SYSTANE) 0.4-0.3 % SOLN Apply 1 drop to eye daily. Into affected eyes    Historical Provider, MD  polyethylene glycol (MIRALAX / GLYCOLAX) packet Take 17 g by mouth daily as needed for mild constipation. For  constipation    Historical Provider, MD  Polyvinyl Alcohol-Povidone (REFRESH OP) Place 1 drop into both eyes 2 (two) times daily. Refresh Gel Opth    Historical Provider, MD  potassium chloride SA (K-DUR,KLOR-CON) 20 MEQ tablet Take 0.5 tablets (10 mEq total) by mouth daily. 08/25/13   Belva Crome III, MD  silodosin (RAPAFLO) 8 MG CAPS capsule Take 8 mg by mouth daily after supper.     Historical Provider, MD  Tafluprost (ZIOPTAN) 0.0015 % SOLN Place 1 drop into both eyes at bedtime. Use at night    Historical Provider, MD  torsemide (DEMADEX) 20 MG tablet Take 80 mg by mouth daily. Take 45m in the morning total of 4 pills. 08/14/13   HSinclair Grooms MD   BP  146/61  Pulse 62  Temp(Src) 98.9 F (37.2 C) (Oral)  Resp 19  Ht '5\' 7"'  (1.702 m)  Wt 189 lb (85.73 kg)  BMI 29.59 kg/m2  SpO2 100% Physical Exam  Constitutional: He is oriented to person, place, and time. He appears well-developed and well-nourished. No distress.  80 year old black male. Dyspneic. Awake alert and oriented.  HENT:  Head: Normocephalic.  Eyes: Conjunctivae are normal. Pupils are equal, round, and reactive to light. No scleral icterus.  Neck: Normal range of motion. Neck supple. No JVD present. No thyromegaly present.  Cardiovascular: Normal rate and regular rhythm.  Exam reveals no gallop and no friction rub.   No murmur heard. Paced rhythm. Regular.  Pulmonary/Chest: No accessory muscle usage. Tachypnea noted. No respiratory distress. He has wheezes in the right middle field, the right lower field, the left upper field, the left middle field and the left lower field. He has no rales.  Diffuse wheezing. Diminished bibasilar breath sounds.  Abdominal: Soft. Bowel sounds are normal. He exhibits no distension. There is no tenderness. There is no rebound.   Soft abdomen. Distention or urinary bladder palpable. Bedside ultrasound shows mild distention of the bladder.  Musculoskeletal: Normal range of motion.   Neurological: He is alert and oriented to person, place, and time.  Skin: Skin is warm and dry. No rash noted.  No dependent edema  Psychiatric: He has a normal mood and affect. His behavior is normal.    ED Course  Procedures (including critical care time) Labs Review Labs Reviewed  BASIC METABOLIC PANEL - Abnormal; Notable for the following:    Glucose, Bld 191 (*)    BUN 37 (*)    Creatinine, Ser 2.23 (*)    GFR calc non Af Amer 24 (*)    GFR calc Af Amer 28 (*)    All other components within normal limits  CBC WITH DIFFERENTIAL - Abnormal; Notable for the following:    RBC 3.43 (*)    Hemoglobin 11.1 (*)    HCT 34.0 (*)    All other components within normal limits  PRO B NATRIURETIC PEPTIDE - Abnormal; Notable for the following:    Pro B Natriuretic peptide (BNP) 2905.0 (*)    All other components within normal limits  TROPONIN I  URINALYSIS, ROUTINE W REFLEX MICROSCOPIC  URIC ACID    Imaging Review Ct Chest Wo Contrast  09/05/2013   CLINICAL DATA:  Dyspnea, chronic respiratory distress, CHF  EXAM: CT CHEST WITHOUT CONTRAST  TECHNIQUE: Multidetector CT imaging of the chest was performed following the standard protocol without IV contrast.  COMPARISON:  CT chest dated 08/10/2013. Partial comparison to CTA abdomen/pelvis dated 10/18/2005  FINDINGS: Evaluation of the lung parenchyma is constrained by respiratory motion.  Mild diffuse hazy ground-glass opacities, likely reflecting mild interstitial edema. Small to moderate bilateral pleural effusions, left greater than right.  10 mm nodule at the right lung base (series cyst 202/image 41), unchanged since 2007, benign. No pneumothorax.  Visualized thyroid is mildly enlarged/heterogeneous with substernal extension of the left lobe (series 201/image 10).  Mild cardiomegaly. Trace pericardial fluid. Coronary atherosclerosis. Atherosclerotic calcifications of the aortic arch. Left subclavian pacemaker.  Multiple small mediastinal nodes,  including a 12 mm short axis precarinal node with preservation of the normal fatty hilum (series 201/image 22), likely reactive.  Visualized upper abdomen is notable for a 2.2 x 2.0 cm left adrenal nodule (series 201/image 59), unchanged from 2007, likely reflecting a benign adrenal adenoma.  Degenerative changes  of the visualized thoracolumbar spine.  IMPRESSION: Mild cardiomegaly with suspected mild interstitial edema. Small to moderate bilateral pleural effusions, right greater than right.  10 mm nodule at the right lung base, unchanged from 2007, benign.  2.2 cm left adrenal adenoma.   Electronically Signed   By: [name] Hy M.D.   On: 09/05/2013 11:02   Dg Chest Port 1 View  09/05/2013   CLINICAL DATA:  Hospitalized few weeks ago for congestive heart failure with shortness of breath and chest tightness today  EXAM: PORTABLE CHEST - 1 VIEW  COMPARISON:  CT CHEST W/O CM dated 08/10/2013; DG CHEST 1V PORT dated 08/07/2013; DG CHEST 1V PORT dated 08/05/2013  FINDINGS: Heart size and vascular pattern are normal. Two lead cardiac pacer is present. Left lung is clear. There is hazy opacity in the right middle lobe. This is similar to chest radiograph performed 08/07/2013 but not noted on 08/10/2013 CT scan.  IMPRESSION: Hazy right middle lobe opacity concerning for recurrent infiltrate. This could represent pneumonia or asymmetric pulmonary edema.   Electronically Signed   By: Skipper Cliche M.D.   On: 09/05/2013 09:02     EKG Interpretation None      MDM   Final diagnoses:  CHF (congestive heart failure)  Bronchospasm    Plan:  Neb.  Foley.  Lasix. Hydralazine.  CXR, Lab, Reevaluation  Patient has opacity in the right or lung. Similar to opacity seen on 3/27 of last month. After 3 days of diuresis this has resolved and was not evident on CT scan 3:30. Similar appearance today. CT scan again shows no infiltrate. Stable right lower lung mass. He is given one albuterol nebulized treatment and he  feels much improved. She is oxygenating 94% on room air. His urinary retention resolved with Foley catheter he is diuresed 400 cc. He is stable. He is well oxygenated. He does not want to be admitted. He is quite confident that he would like to go home. He will wear the leg bag and Foley until he follows up with his urologist. He will use an albuterol inhaler and continue Zithromax prescribed by his primary care physician. Asked to return here with any changes at home.    Tanna Furry, MD 09/05/13 1145

## 2013-09-07 ENCOUNTER — Telehealth: Payer: Self-pay

## 2013-09-07 MED ORDER — HYDRALAZINE HCL 25 MG PO TABS: 25.0000 mg | ORAL_TABLET | Freq: Two times a day (BID) | ORAL | Status: DC

## 2013-09-07 NOTE — Telephone Encounter (Signed)
rcvd call from pt caretaker [name].she sts that pt has been unable to tolerate hyrdalazine 50mg  in the am and 25mg  the pm.she sts that pt bpis running in the 100/50 and pt is symptomatic.adv I would talk with Dr.Smith and call back with his recommendations

## 2013-09-07 NOTE — Telephone Encounter (Signed)
pt caretaker given Dr.Smith instructions to reduce hydarlazine to 25mg  twice daily.call the office if symptoms do not improve.pt caretaker [name] verbalized understanding.

## 2013-09-10 ENCOUNTER — Emergency Department (HOSPITAL_COMMUNITY)
Admission: EM | Admit: 2013-09-10 | Discharge: 2013-09-10 | Disposition: A | Discharge status: Home / Self Care | Payer: Medicare Other | Source: Home / Self Care | Attending: Emergency Medicine | Admitting: Emergency Medicine

## 2013-09-10 ENCOUNTER — Encounter (HOSPITAL_COMMUNITY): Payer: Self-pay | Admitting: Emergency Medicine

## 2013-09-10 DIAGNOSIS — E871 Hypo-osmolality and hyponatremia: Secondary | ICD-10-CM

## 2013-09-10 DIAGNOSIS — Z8701 Personal history of pneumonia (recurrent): Secondary | ICD-10-CM

## 2013-09-10 DIAGNOSIS — Z950 Presence of cardiac pacemaker: Secondary | ICD-10-CM | POA: Insufficient documentation

## 2013-09-10 DIAGNOSIS — Z888 Allergy status to other drugs, medicaments and biological substances status: Secondary | ICD-10-CM | POA: Insufficient documentation

## 2013-09-10 DIAGNOSIS — I739 Peripheral vascular disease, unspecified: Secondary | ICD-10-CM | POA: Insufficient documentation

## 2013-09-10 DIAGNOSIS — Z794 Long term (current) use of insulin: Secondary | ICD-10-CM

## 2013-09-10 DIAGNOSIS — E785 Hyperlipidemia, unspecified: Secondary | ICD-10-CM

## 2013-09-10 DIAGNOSIS — R339 Retention of urine, unspecified: Secondary | ICD-10-CM

## 2013-09-10 DIAGNOSIS — N184 Chronic kidney disease, stage 4 (severe): Secondary | ICD-10-CM | POA: Insufficient documentation

## 2013-09-10 DIAGNOSIS — Z8673 Personal history of transient ischemic attack (TIA), and cerebral infarction without residual deficits: Secondary | ICD-10-CM

## 2013-09-10 DIAGNOSIS — M129 Arthropathy, unspecified: Secondary | ICD-10-CM

## 2013-09-10 DIAGNOSIS — I10 Essential (primary) hypertension: Secondary | ICD-10-CM

## 2013-09-10 DIAGNOSIS — I5043 Acute on chronic combined systolic (congestive) and diastolic (congestive) heart failure: Secondary | ICD-10-CM | POA: Diagnosis not present

## 2013-09-10 DIAGNOSIS — I442 Atrioventricular block, complete: Secondary | ICD-10-CM

## 2013-09-10 DIAGNOSIS — E119 Type 2 diabetes mellitus without complications: Secondary | ICD-10-CM | POA: Insufficient documentation

## 2013-09-10 DIAGNOSIS — I779 Disorder of arteries and arterioles, unspecified: Secondary | ICD-10-CM

## 2013-09-10 DIAGNOSIS — I4891 Unspecified atrial fibrillation: Secondary | ICD-10-CM | POA: Insufficient documentation

## 2013-09-10 DIAGNOSIS — I5032 Chronic diastolic (congestive) heart failure: Secondary | ICD-10-CM | POA: Insufficient documentation

## 2013-09-10 DIAGNOSIS — R0602 Shortness of breath: Secondary | ICD-10-CM | POA: Diagnosis not present

## 2013-09-10 DIAGNOSIS — I471 Supraventricular tachycardia, unspecified: Secondary | ICD-10-CM | POA: Insufficient documentation

## 2013-09-10 DIAGNOSIS — M4800 Spinal stenosis, site unspecified: Secondary | ICD-10-CM | POA: Insufficient documentation

## 2013-09-10 DIAGNOSIS — Z79899 Other long term (current) drug therapy: Secondary | ICD-10-CM | POA: Insufficient documentation

## 2013-09-10 DIAGNOSIS — D649 Anemia, unspecified: Secondary | ICD-10-CM

## 2013-09-10 DIAGNOSIS — I2510 Atherosclerotic heart disease of native coronary artery without angina pectoris: Secondary | ICD-10-CM

## 2013-09-10 DIAGNOSIS — Z7902 Long term (current) use of antithrombotics/antiplatelets: Secondary | ICD-10-CM | POA: Insufficient documentation

## 2013-09-10 LAB — URINALYSIS, ROUTINE W REFLEX MICROSCOPIC
BILIRUBIN URINE: NEGATIVE
Glucose, UA: NEGATIVE mg/dL
Hgb urine dipstick: NEGATIVE
KETONES UR: NEGATIVE mg/dL
Leukocytes, UA: NEGATIVE
Nitrite: NEGATIVE
Protein, ur: NEGATIVE mg/dL
Specific Gravity, Urine: 1.014 (ref 1.005–1.030)
UROBILINOGEN UA: 0.2 mg/dL (ref 0.0–1.0)
pH: 6 (ref 5.0–8.0)

## 2013-09-10 NOTE — ED Provider Notes (Signed)
CSN: [id number]     Arrival date & time 09/10/13  0600 History   First MD Initiated Contact with Patient 09/10/13 0631     Chief Complaint  Patient presents with  . Shortness of Breath  . Urinary Retention       HPI Patient presents to the emergency department with complaints of urinary retention.  He was recently seen and evaluated in emergency department and treated with Foley catheter.  This was removed in the urology office 2 days ago.  He states initially he had decent flow but since then his flows continued to trickle off.  Today he could barely urinate yet he still needs to urinate.  He feels pressure in his lower abdomen.  No nausea or vomiting.  No fevers or chills.  He does report mild shortness of breath but he states this is typical for when he receives urinary retention.  He states usually treatment with Foley catheter resulted in improvement in his breathing.  No productive cough.  No other complaints.   Past Medical History  Diagnosis Date  . Diabetes mellitus   . Hyperlipidemia   . Coronary artery disease   . Spinal stenosis   . Pneumonia   . Pacemaker   . Anemia   . Carotid artery disease   . PVD (peripheral vascular disease)   . Hyponatremia     Chronic  . CVA (cerebral infarction)   . CKD (chronic kidney disease) stage 4, GFR 15-29 ml/min   . Long term (current) use of anticoagulants     No bleeding on Eliquis  . Dysrhythmia   . PSVT (paroxysmal supraventricular tachycardia)   . PAF (paroxysmal atrial fibrillation)     Asymptomatic. On Eliquis.  . AV block, 3rd degree     With DDD St. Jude PM, initially placed in 1993 by Dr. Nils Pyle. Device upgrade 10/2002 to DDD, by Dr. Rollene Fare complicated by bleeding.  . Essential hypertension, benign   . CHF (congestive heart failure)   . Chronic diastolic congestive heart failure     ECHO 05/20/12 LVEF estimated by 2D at 55-60%  . Arthritis    Past Surgical History  Procedure Laterality Date  . Lumbar laminectomy   1995  . Back surgery    . Total knee arthroplasty Left   . Quadriceps tendon repair    . Cataract extraction    . Carotid endarterectomy    . Yag laser application Right 4/53/6468    Procedure: YAG LASER CAPSULOTOMY OF RIGHT EYE;  Surgeon: [name], MD;  Location: [facility];  Service: Ophthalmology;  Laterality: Right;  . Tonsillectomy    . Insert / replace / remove pacemaker      DDD St. Jude PM, initially placed in 1993 by Dr. Nils Pyle. Device upgrade 10/2002 to DDD, by Dr. Rollene Fare complicated by bleeding.  . Carotid endarterectomy Right 2006  . Transurethral resection of prostate    . Cardioversion N/A 06/16/2013    Procedure: CARDIOVERSION BEDSIDE;  Surgeon: [name], MD;  Location: [facility] OR;  Service: Cardiovascular;  Laterality: N/A;   Family History  Problem Relation Age of Onset  . Multiple myeloma Father   . Hypertension Mother    History  Substance Use Topics  . Smoking status: Never Smoker   . Smokeless tobacco: Never Used  . Alcohol Use: No     Comment: Cocktails occasionally    Review of Systems  All other systems reviewed and are negative.     Allergies  Aggrenox; Carvedilol; Claritin; Mucinex; Plavix; Rapaflo; Statins; and Travatan z  Home Medications   Prior to Admission medications   Medication Sig Start Date End Date Taking? Authorizing Provider  albuterol (PROVENTIL HFA;VENTOLIN HFA) 108 (90 BASE) MCG/ACT inhaler Inhale 1-2 puffs into the lungs every 6 (six) hours as needed for wheezing. 09/05/13   Tanna Furry, MD  allopurinol (ZYLOPRIM) 100 MG tablet Take 100 mg by mouth at bedtime.    Historical Provider, MD  amiodarone (PACERONE) 200 MG tablet Take 200 mg by mouth daily. 06/22/13   Belva Crome III, MD  apixaban (ELIQUIS) 2.5 MG TABS tablet Take 2.5 mg by mouth 2 (two) times daily.    Historical Provider, MD  dutasteride (AVODART) 0.5 MG capsule Take 0.5 mg by mouth daily.     Historical Provider, MD  ferrous sulfate 325 (65 FE) MG  tablet Take 325 mg by mouth every other day.     Historical Provider, MD  hydrALAZINE (APRESOLINE) 25 MG tablet Take 1 tablet (25 mg total) by mouth 2 (two) times daily. 09/07/13   Belva Crome III, MD  insulin glargine (LANTUS) 100 UNIT/ML injection Inject 11-12 Units into the skin at bedtime.     Historical Provider, MD  insulin lispro (HUMALOG) 100 UNIT/ML injection Inject 2-9 Units into the skin 3 (three) times daily before meals. 9 units with breakfast, 2-3 units at lunch & 9 units after supper    Historical Provider, MD  isosorbide dinitrate (ISORDIL) 20 MG tablet Take 1 tablet (20 mg total) by mouth 2 (two) times daily. 08/14/13   Belva Crome III, MD  metoprolol tartrate (LOPRESSOR) 25 MG tablet Take 1 tablet (25 mg total) by mouth daily. 08/17/13   Belva Crome III, MD  Multiple Vitamin (MULTIVITAMIN WITH MINERALS) TABS Take 1 tablet by mouth daily. Diabetic pak by Petra Kuba from [place] Provider, MD  Polyethyl Glycol-Propyl Glycol (SYSTANE) 0.4-0.3 % SOLN Apply 1 drop to eye daily. Into affected eyes    Historical Provider, MD  polyethylene glycol (MIRALAX / GLYCOLAX) packet Take 17 g by mouth 2 (two) times daily as needed for mild constipation. For constipation    Historical Provider, MD  Polyvinyl Alcohol-Povidone (REFRESH OP) Place 1 drop into both eyes 2 (two) times daily. Refresh Gel Opth    Historical Provider, MD  potassium chloride SA (K-DUR,KLOR-CON) 20 MEQ tablet Take 20 mEq by mouth daily. 08/25/13   Belva Crome III, MD  silodosin (RAPAFLO) 8 MG CAPS capsule Take 8 mg by mouth daily after supper.     Historical Provider, MD  Tafluprost (ZIOPTAN) 0.0015 % SOLN Place 1 drop into both eyes at bedtime. Use at night    Historical Provider, MD  torsemide (DEMADEX) 20 MG tablet Take 80 mg by mouth daily. Take 24m in the morning total of 4 pills. 08/14/13   HBelva CromeIII, MD   BP 122/56  Pulse 64  Temp(Src) 98.1 F (36.7 C) (Oral)  Resp 23  Ht '5\' 7"'  (1.702 m)  Wt 187 lb  (84.823 kg)  BMI 29.28 kg/m2  SpO2 94% Physical Exam  Nursing note and vitals reviewed. Constitutional: He is oriented to person, place, and time. He appears well-developed and well-nourished.  HENT:  Head: Normocephalic and atraumatic.  Eyes: EOM are normal.  Neck: Normal range of motion.  Cardiovascular: Normal rate, regular rhythm, normal heart sounds and intact distal pulses.   Pulmonary/Chest: Effort normal and breath sounds normal. No respiratory distress.  Abdominal: Soft. He exhibits no distension. There is no tenderness.  Mild suprapubic fullness.  No suprapubic tenderness.  Musculoskeletal: Normal range of motion.  Neurological: He is alert and oriented to person, place, and time.  Skin: Skin is warm and dry.  Psychiatric: He has a normal mood and affect. Judgment normal.    ED Course  Procedures (including critical care time) Labs Review Labs Reviewed  URINE CULTURE  URINALYSIS, ROUTINE W REFLEX MICROSCOPIC    Imaging Review No results found.   EKG Interpretation None      MDM   Final diagnoses:  Urinary retention    Patient will undergo Foley catheter placement.  Urinalysis and urine culture will be sent.  At this time the patient's lungs are clear.  He has no significant shortness of breath now.  He attributes his shortness of breath 2 when his urinary retention becomes worse.  7:34 AM Patient feels much better.  400 cc of urine returned.  He states that significantly helped.  He has no shortness breath at this time.  Outpatient followup with his urologist.  He understands to return to the ER for new or worsening symptoms  Hoy Morn, MD 09/10/13 [phone]

## 2013-09-10 NOTE — ED Notes (Signed)
Patient presents stating that he left here on Saturday due to having urinary retention.  Is having trouble with the same thing and also SOB

## 2013-09-10 NOTE — ED Notes (Signed)
Leg bag placed on pt attached to foley pt aaox4 states feels so much better. States was just here on sat for same thing. Introduced self to pt and pt given warm blankets

## 2013-09-11 LAB — URINE CULTURE
COLONY COUNT: NO GROWTH
Culture: NO GROWTH

## 2013-09-13 ENCOUNTER — Inpatient Hospital Stay (HOSPITAL_COMMUNITY)
Admission: EM | Admit: 2013-09-13 | Discharge: 2013-09-15 | DRG: 292 | Disposition: A | Discharge status: Home / Self Care | Payer: Medicare Other | Attending: Interventional Cardiology | Admitting: Interventional Cardiology

## 2013-09-13 ENCOUNTER — Encounter (HOSPITAL_COMMUNITY): Payer: Self-pay | Admitting: Emergency Medicine

## 2013-09-13 ENCOUNTER — Emergency Department (HOSPITAL_COMMUNITY): Payer: Medicare Other

## 2013-09-13 DIAGNOSIS — I129 Hypertensive chronic kidney disease with stage 1 through stage 4 chronic kidney disease, or unspecified chronic kidney disease: Secondary | ICD-10-CM | POA: Diagnosis present

## 2013-09-13 DIAGNOSIS — K5900 Constipation, unspecified: Secondary | ICD-10-CM | POA: Diagnosis present

## 2013-09-13 DIAGNOSIS — I2510 Atherosclerotic heart disease of native coronary artery without angina pectoris: Secondary | ICD-10-CM | POA: Diagnosis present

## 2013-09-13 DIAGNOSIS — Z794 Long term (current) use of insulin: Secondary | ICD-10-CM | POA: Diagnosis not present

## 2013-09-13 DIAGNOSIS — Z8673 Personal history of transient ischemic attack (TIA), and cerebral infarction without residual deficits: Secondary | ICD-10-CM

## 2013-09-13 DIAGNOSIS — I119 Hypertensive heart disease without heart failure: Secondary | ICD-10-CM | POA: Diagnosis present

## 2013-09-13 DIAGNOSIS — Z888 Allergy status to other drugs, medicaments and biological substances status: Secondary | ICD-10-CM | POA: Diagnosis not present

## 2013-09-13 DIAGNOSIS — I5043 Acute on chronic combined systolic (congestive) and diastolic (congestive) heart failure: Principal | ICD-10-CM | POA: Diagnosis present

## 2013-09-13 DIAGNOSIS — Z8249 Family history of ischemic heart disease and other diseases of the circulatory system: Secondary | ICD-10-CM

## 2013-09-13 DIAGNOSIS — I739 Peripheral vascular disease, unspecified: Secondary | ICD-10-CM | POA: Diagnosis present

## 2013-09-13 DIAGNOSIS — J811 Chronic pulmonary edema: Secondary | ICD-10-CM

## 2013-09-13 DIAGNOSIS — N179 Acute kidney failure, unspecified: Secondary | ICD-10-CM

## 2013-09-13 DIAGNOSIS — Z79899 Other long term (current) drug therapy: Secondary | ICD-10-CM | POA: Diagnosis not present

## 2013-09-13 DIAGNOSIS — I169 Hypertensive crisis, unspecified: Secondary | ICD-10-CM

## 2013-09-13 DIAGNOSIS — N184 Chronic kidney disease, stage 4 (severe): Secondary | ICD-10-CM | POA: Diagnosis present

## 2013-09-13 DIAGNOSIS — I4891 Unspecified atrial fibrillation: Secondary | ICD-10-CM | POA: Diagnosis present

## 2013-09-13 DIAGNOSIS — Z96659 Presence of unspecified artificial knee joint: Secondary | ICD-10-CM

## 2013-09-13 DIAGNOSIS — J9600 Acute respiratory failure, unspecified whether with hypoxia or hypercapnia: Secondary | ICD-10-CM

## 2013-09-13 DIAGNOSIS — N183 Chronic kidney disease, stage 3 unspecified: Secondary | ICD-10-CM | POA: Diagnosis present

## 2013-09-13 DIAGNOSIS — E785 Hyperlipidemia, unspecified: Secondary | ICD-10-CM | POA: Diagnosis present

## 2013-09-13 DIAGNOSIS — E1142 Type 2 diabetes mellitus with diabetic polyneuropathy: Secondary | ICD-10-CM | POA: Diagnosis present

## 2013-09-13 DIAGNOSIS — E119 Type 2 diabetes mellitus without complications: Secondary | ICD-10-CM

## 2013-09-13 DIAGNOSIS — Z9849 Cataract extraction status, unspecified eye: Secondary | ICD-10-CM | POA: Diagnosis not present

## 2013-09-13 DIAGNOSIS — I10 Essential (primary) hypertension: Secondary | ICD-10-CM

## 2013-09-13 DIAGNOSIS — R339 Retention of urine, unspecified: Secondary | ICD-10-CM | POA: Diagnosis not present

## 2013-09-13 DIAGNOSIS — I509 Heart failure, unspecified: Secondary | ICD-10-CM | POA: Diagnosis present

## 2013-09-13 DIAGNOSIS — N189 Chronic kidney disease, unspecified: Secondary | ICD-10-CM

## 2013-09-13 DIAGNOSIS — Z807 Family history of other malignant neoplasms of lymphoid, hematopoietic and related tissues: Secondary | ICD-10-CM | POA: Diagnosis not present

## 2013-09-13 DIAGNOSIS — R0602 Shortness of breath: Secondary | ICD-10-CM | POA: Diagnosis present

## 2013-09-13 DIAGNOSIS — I5033 Acute on chronic diastolic (congestive) heart failure: Secondary | ICD-10-CM | POA: Diagnosis present

## 2013-09-13 DIAGNOSIS — Z950 Presence of cardiac pacemaker: Secondary | ICD-10-CM | POA: Diagnosis not present

## 2013-09-13 DIAGNOSIS — E871 Hypo-osmolality and hyponatremia: Secondary | ICD-10-CM | POA: Diagnosis present

## 2013-09-13 DIAGNOSIS — Z7901 Long term (current) use of anticoagulants: Secondary | ICD-10-CM

## 2013-09-13 LAB — CBC WITH DIFFERENTIAL/PLATELET
BASOS ABS: 0 10*3/uL (ref 0.0–0.1)
Basophils Relative: 0 % (ref 0–1)
EOS ABS: 0.1 10*3/uL (ref 0.0–0.7)
Eosinophils Relative: 1 % (ref 0–5)
HCT: 34.4 % — ABNORMAL LOW (ref 39.0–52.0)
HEMOGLOBIN: 11.3 g/dL — AB (ref 13.0–17.0)
Lymphocytes Relative: 14 % (ref 12–46)
Lymphs Abs: 1 10*3/uL (ref 0.7–4.0)
MCH: 32.6 pg (ref 26.0–34.0)
MCHC: 32.8 g/dL (ref 30.0–36.0)
MCV: 99.1 fL (ref 78.0–100.0)
MONO ABS: 0.6 10*3/uL (ref 0.1–1.0)
MONOS PCT: 8 % (ref 3–12)
NEUTROS PCT: 77 % (ref 43–77)
Neutro Abs: 5.5 10*3/uL (ref 1.7–7.7)
Platelets: 181 10*3/uL (ref 150–400)
RBC: 3.47 MIL/uL — ABNORMAL LOW (ref 4.22–5.81)
RDW: 15.1 % (ref 11.5–15.5)
WBC: 7.2 10*3/uL (ref 4.0–10.5)

## 2013-09-13 LAB — URINALYSIS, ROUTINE W REFLEX MICROSCOPIC
Bilirubin Urine: NEGATIVE
Glucose, UA: NEGATIVE mg/dL
KETONES UR: NEGATIVE mg/dL
NITRITE: NEGATIVE
PROTEIN: NEGATIVE mg/dL
Specific Gravity, Urine: 1.012 (ref 1.005–1.030)
UROBILINOGEN UA: 0.2 mg/dL (ref 0.0–1.0)
pH: 7.5 (ref 5.0–8.0)

## 2013-09-13 LAB — BASIC METABOLIC PANEL
BUN: 42 mg/dL — AB (ref 6–23)
CALCIUM: 9.1 mg/dL (ref 8.4–10.5)
CHLORIDE: 96 meq/L (ref 96–112)
CO2: 32 mEq/L (ref 19–32)
CREATININE: 2.22 mg/dL — AB (ref 0.50–1.35)
GFR calc Af Amer: 28 mL/min — ABNORMAL LOW (ref >90)
GFR calc non Af Amer: 24 mL/min — ABNORMAL LOW (ref >90)
Glucose, Bld: 165 mg/dL — ABNORMAL HIGH (ref 70–99)
Potassium: 4.4 mEq/L (ref 3.7–5.3)
Sodium: 139 mEq/L (ref 137–147)

## 2013-09-13 LAB — TROPONIN I
Troponin I: <0.3 ng/mL (ref <0.30)
Troponin I: <0.3 ng/mL (ref <0.30)

## 2013-09-13 LAB — I-STAT TROPONIN, ED: Troponin i, poc: 0.13 ng/mL (ref 0.00–0.08)

## 2013-09-13 LAB — PRO B NATRIURETIC PEPTIDE: PRO B NATRI PEPTIDE: 3231 pg/mL — AB (ref 0–450)

## 2013-09-13 LAB — GLUCOSE, CAPILLARY
GLUCOSE-CAPILLARY: 163 mg/dL — AB (ref 70–99)
Glucose-Capillary: 213 mg/dL — ABNORMAL HIGH (ref 70–99)

## 2013-09-13 LAB — CBG MONITORING, ED: GLUCOSE-CAPILLARY: 166 mg/dL — AB (ref 70–99)

## 2013-09-13 LAB — URINE MICROSCOPIC-ADD ON

## 2013-09-13 MED ORDER — POLYETHYL GLYCOL-PROPYL GLYCOL 0.4-0.3 % OP SOLN: 1.0000 [drp] | Freq: Every day | OPHTHALMIC | Status: DC | PRN

## 2013-09-13 MED ORDER — ZOLPIDEM TARTRATE 5 MG PO TABS: 5.0000 mg | ORAL_TABLET | Freq: Every evening | ORAL | Status: DC | PRN

## 2013-09-13 MED ORDER — HYDRALAZINE HCL 25 MG PO TABS
25.0000 mg | ORAL_TABLET | Freq: Three times a day (TID) | ORAL | Status: DC
  Administered 2013-09-13 – 2013-09-15 (×6): 25 mg via ORAL
  Filled 2013-09-13 (×10): qty 1

## 2013-09-13 MED ORDER — ADULT MULTIVITAMIN W/MINERALS CH
1.0000 | ORAL_TABLET | Freq: Every day | ORAL | Status: DC
  Administered 2013-09-14 – 2013-09-15 (×2): 1 via ORAL
  Filled 2013-09-13 (×2): qty 1

## 2013-09-13 MED ORDER — POLYETHYLENE GLYCOL 3350 17 G PO PACK
17.0000 g | PACK | Freq: Two times a day (BID) | ORAL | Status: DC | PRN
  Administered 2013-09-14: 17 g via ORAL
  Filled 2013-09-13: qty 1

## 2013-09-13 MED ORDER — POTASSIUM CHLORIDE CRYS ER 20 MEQ PO TBCR
20.0000 meq | EXTENDED_RELEASE_TABLET | Freq: Every day | ORAL | Status: DC
  Administered 2013-09-13 – 2013-09-15 (×3): 20 meq via ORAL
  Filled 2013-09-13 (×3): qty 1

## 2013-09-13 MED ORDER — ISOSORBIDE DINITRATE 20 MG PO TABS
20.0000 mg | ORAL_TABLET | Freq: Two times a day (BID) | ORAL | Status: DC
  Administered 2013-09-13: 20 mg via ORAL
  Filled 2013-09-13 (×2): qty 1

## 2013-09-13 MED ORDER — FUROSEMIDE 10 MG/ML IJ SOLN
80.0000 mg | Freq: Two times a day (BID) | INTRAMUSCULAR | Status: DC
  Administered 2013-09-13 – 2013-09-14 (×2): 80 mg via INTRAVENOUS
  Filled 2013-09-13 (×4): qty 8

## 2013-09-13 MED ORDER — ONDANSETRON HCL 4 MG/2ML IJ SOLN
4.0000 mg | Freq: Four times a day (QID) | INTRAMUSCULAR | Status: DC | PRN
Start: 2013-09-13 — End: 2013-09-15

## 2013-09-13 MED ORDER — ALLOPURINOL 100 MG PO TABS
100.0000 mg | ORAL_TABLET | Freq: Every day | ORAL | Status: DC
  Administered 2013-09-13 – 2013-09-14 (×3): 100 mg via ORAL
  Filled 2013-09-13 (×5): qty 1

## 2013-09-13 MED ORDER — AMIODARONE HCL 200 MG PO TABS
200.0000 mg | ORAL_TABLET | Freq: Every day | ORAL | Status: DC
  Administered 2013-09-13 – 2013-09-15 (×3): 200 mg via ORAL
  Filled 2013-09-13 (×3): qty 1

## 2013-09-13 MED ORDER — METOPROLOL TARTRATE 25 MG PO TABS
25.0000 mg | ORAL_TABLET | Freq: Every day | ORAL | Status: DC
  Administered 2013-09-13 – 2013-09-15 (×3): 25 mg via ORAL
  Filled 2013-09-13 (×3): qty 1

## 2013-09-13 MED ORDER — INSULIN GLARGINE 100 UNIT/ML ~~LOC~~ SOLN
11.0000 [IU] | Freq: Every day | SUBCUTANEOUS | Status: DC
  Administered 2013-09-13 – 2013-09-14 (×2): 11 [IU] via SUBCUTANEOUS
  Filled 2013-09-13 (×3): qty 0.11

## 2013-09-13 MED ORDER — CARBOXYMETHYLCELLULOSE SODIUM 1 % OP SOLN: 1.0000 [drp] | Freq: Two times a day (BID) | OPHTHALMIC | Status: DC

## 2013-09-13 MED ORDER — LATANOPROST 0.005 % OP SOLN
1.0000 [drp] | Freq: Every day | OPHTHALMIC | Status: DC
  Administered 2013-09-13 – 2013-09-14 (×2): 1 [drp] via OPHTHALMIC
  Filled 2013-09-13: qty 2.5

## 2013-09-13 MED ORDER — DUTASTERIDE 0.5 MG PO CAPS
0.5000 mg | ORAL_CAPSULE | Freq: Every day | ORAL | Status: DC
  Administered 2013-09-13 – 2013-09-15 (×3): 0.5 mg via ORAL
  Filled 2013-09-13 (×3): qty 1

## 2013-09-13 MED ORDER — ALPRAZOLAM 0.25 MG PO TABS: 0.2500 mg | ORAL_TABLET | Freq: Two times a day (BID) | ORAL | Status: DC | PRN

## 2013-09-13 MED ORDER — FUROSEMIDE 10 MG/ML IJ SOLN
80.0000 mg | Freq: Once | INTRAMUSCULAR | Status: AC
  Administered 2013-09-13: 80 mg via INTRAVENOUS
  Filled 2013-09-13: qty 8

## 2013-09-13 MED ORDER — ACETAMINOPHEN 325 MG PO TABS: 650.0000 mg | ORAL_TABLET | ORAL | Status: DC | PRN

## 2013-09-13 MED ORDER — INSULIN ASPART 100 UNIT/ML ~~LOC~~ SOLN
0.0000 [IU] | Freq: Three times a day (TID) | SUBCUTANEOUS | Status: DC
  Administered 2013-09-13: 3 [IU] via SUBCUTANEOUS
  Administered 2013-09-14: 2 [IU] via SUBCUTANEOUS
  Administered 2013-09-14: 5 [IU] via SUBCUTANEOUS
  Administered 2013-09-15: 3 [IU] via SUBCUTANEOUS

## 2013-09-13 MED ORDER — NITROGLYCERIN IN D5W 200-5 MCG/ML-% IV SOLN
10.0000 ug/min | INTRAVENOUS | Status: DC
  Administered 2013-09-13: 5 ug/min via INTRAVENOUS
  Filled 2013-09-13: qty 250

## 2013-09-13 MED ORDER — APIXABAN 2.5 MG PO TABS
2.5000 mg | ORAL_TABLET | Freq: Two times a day (BID) | ORAL | Status: DC
  Administered 2013-09-13 – 2013-09-15 (×5): 2.5 mg via ORAL
  Filled 2013-09-13 (×7): qty 1

## 2013-09-13 MED ORDER — POLYVINYL ALCOHOL 1.4 % OP SOLN
1.0000 [drp] | OPHTHALMIC | Status: DC | PRN
  Filled 2013-09-13: qty 15

## 2013-09-13 MED ORDER — FERROUS SULFATE 325 (65 FE) MG PO TABS
325.0000 mg | ORAL_TABLET | ORAL | Status: DC
  Administered 2013-09-13 – 2013-09-15 (×2): 325 mg via ORAL
  Filled 2013-09-13 (×2): qty 1

## 2013-09-13 MED ORDER — NITROGLYCERIN 0.4 MG SL SUBL: 0.4000 mg | SUBLINGUAL_TABLET | SUBLINGUAL | Status: DC | PRN

## 2013-09-13 NOTE — ED Provider Notes (Addendum)
CSN: [id number]     Arrival date & time 09/13/13  0420 History   First MD Initiated Contact with Patient 09/13/13 0422     Chief Complaint  Patient presents with  . Shortness of Breath  . Chest Pain     (Consider location/radiation/quality/duration/timing/severity/associated sxs/prior Treatment) HPI  [name] is a 80 yo man with an extensive PMH including a history of diastolic heart failure, HTN, DM, CKD stage 4 and AF requiring chronic anticoagulation.   He has had several recent admissions for acute respiratory distress/failure secondary to decompensated heart failure. He arrives via POV from home with SOB. Patient was feeling OK when he went to sleep a few hours ago. He awoke with SOB shortly PTA. Patient says he feels like his lungs are full of fluid. He denies chest pain. No cough or fever. He has orthopnea. He has not noticed much change in peripheral edema.   The patient reports compliance with low sodium diet and medications.  Past Medical History  Diagnosis Date  . Diabetes mellitus   . Hyperlipidemia   . Coronary artery disease   . Spinal stenosis   . Pneumonia   . Pacemaker   . Anemia   . Carotid artery disease   . PVD (peripheral vascular disease)   . Hyponatremia     Chronic  . CVA (cerebral infarction)   . CKD (chronic kidney disease) stage 4, GFR 15-29 ml/min   . Long term (current) use of anticoagulants     No bleeding on Eliquis  . Dysrhythmia   . PSVT (paroxysmal supraventricular tachycardia)   . PAF (paroxysmal atrial fibrillation)     Asymptomatic. On Eliquis.  . AV block, 3rd degree     With DDD St. Jude PM, initially placed in 1993 by Dr. Nils Pyle. Device upgrade 10/2002 to DDD, by Dr. Rollene Fare complicated by bleeding.  . Essential hypertension, benign   . CHF (congestive heart failure)   . Chronic diastolic congestive heart failure     ECHO 05/20/12 LVEF estimated by 2D at 55-60%  . Arthritis    Past Surgical History  Procedure Laterality Date   . Lumbar laminectomy  1995  . Back surgery    . Total knee arthroplasty Left   . Quadriceps tendon repair    . Cataract extraction    . Carotid endarterectomy    . Yag laser application Right 12/22/313    Procedure: YAG LASER CAPSULOTOMY OF RIGHT EYE;  Surgeon: [name], MD;  Location: [place];  Service: Ophthalmology;  Laterality: Right;  . Tonsillectomy    . Insert / replace / remove pacemaker      DDD St. Jude PM, initially placed in 1993 by Dr. Nils Pyle. Device upgrade 10/2002 to DDD, by Dr. Rollene Fare complicated by bleeding.  . Carotid endarterectomy Right 2006  . Transurethral resection of prostate    . Cardioversion N/A 06/16/2013    Procedure: CARDIOVERSION BEDSIDE;  Surgeon: [name], MD;  Location: [facility];  Service: Cardiovascular;  Laterality: N/A;   Family History  Problem Relation Age of Onset  . Multiple myeloma Father   . Hypertension Mother    History  Substance Use Topics  . Smoking status: Never Smoker   . Smokeless tobacco: Never Used  . Alcohol Use: No     Comment: Cocktails occasionally    Review of Systems Abbreviated review of systems is obtained from the patient because of severe respiratory distress. No chest pain. No fever. No abdominal pain.  Level V caveat applies.    Allergies  Aggrenox; Carvedilol; Claritin; Mucinex; Plavix; Rapaflo; Statins; and Travatan z  Home Medications   Prior to Admission medications   Medication Sig Start Date End Date Taking? Authorizing Provider  albuterol (PROVENTIL HFA;VENTOLIN HFA) 108 (90 BASE) MCG/ACT inhaler Inhale 1-2 puffs into the lungs every 6 (six) hours as needed for wheezing. 09/05/13   Tanna Furry, MD  allopurinol (ZYLOPRIM) 100 MG tablet Take 100 mg by mouth at bedtime.    Historical Provider, MD  amiodarone (PACERONE) 200 MG tablet Take 200 mg by mouth daily. 06/22/13   Belva Crome III, MD  apixaban (ELIQUIS) 2.5 MG TABS tablet Take 2.5 mg by mouth 2 (two) times daily.    Historical  Provider, MD  dutasteride (AVODART) 0.5 MG capsule Take 0.5 mg by mouth daily.     Historical Provider, MD  ferrous sulfate 325 (65 FE) MG tablet Take 325 mg by mouth every other day.     Historical Provider, MD  hydrALAZINE (APRESOLINE) 25 MG tablet Take 1 tablet (25 mg total) by mouth 2 (two) times daily. 09/07/13   Belva Crome III, MD  insulin glargine (LANTUS) 100 UNIT/ML injection Inject 11-12 Units into the skin at bedtime.     Historical Provider, MD  insulin lispro (HUMALOG) 100 UNIT/ML injection Inject 2-9 Units into the skin 3 (three) times daily before meals. 9 units with breakfast, 2-3 units at lunch & 9 units after supper    Historical Provider, MD  isosorbide dinitrate (ISORDIL) 20 MG tablet Take 1 tablet (20 mg total) by mouth 2 (two) times daily. 08/14/13   Belva Crome III, MD  metoprolol tartrate (LOPRESSOR) 25 MG tablet Take 1 tablet (25 mg total) by mouth daily. 08/17/13   Belva Crome III, MD  Multiple Vitamin (MULTIVITAMIN WITH MINERALS) TABS Take 1 tablet by mouth daily. Diabetic pak by Petra Kuba from [place] Provider, MD  Polyethyl Glycol-Propyl Glycol (SYSTANE) 0.4-0.3 % SOLN Apply 1 drop to eye daily. Into affected eyes    Historical Provider, MD  polyethylene glycol (MIRALAX / GLYCOLAX) packet Take 17 g by mouth 2 (two) times daily as needed for mild constipation. For constipation    Historical Provider, MD  Polyvinyl Alcohol-Povidone (REFRESH OP) Place 1 drop into both eyes 2 (two) times daily. Refresh Gel Opth    Historical Provider, MD  potassium chloride SA (K-DUR,KLOR-CON) 20 MEQ tablet Take 20 mEq by mouth daily. 08/25/13   Belva Crome III, MD  silodosin (RAPAFLO) 8 MG CAPS capsule Take 8 mg by mouth daily after supper.     Historical Provider, MD  Tafluprost (ZIOPTAN) 0.0015 % SOLN Place 1 drop into both eyes at bedtime. Use at night    Historical Provider, MD  torsemide (DEMADEX) 20 MG tablet Take 80 mg by mouth daily. Take 51m in the morning total of 4  pills. 08/14/13   HBelva CromeIII, MD   BP 199/105  Pulse 71  Temp(Src) 96.9 F (36.1 C) (Axillary)  Resp 22  SpO2 100% Physical Exam  Gen: well developed and well nourished appearing, appears to be in acute respiratory distress, speaks 1 to 2 words in between breaths.  Head: NCAT Eyes: PERL, EOMI Nose: no epistaixis or rhinorrhea Mouth/throat: mucosa is moist and pink Neck: supple, no stridor, JVD present Lungs: RR 32/min with accessory muscle use and retractions, diffuse rales in both lung fields, no wheezing or rhonchi CV: RRR, no murmur, extremities  appear well perfused.  Abd: soft, obese, notender, nondistended Back: no ttp, no cva ttp Skin: warm and dry Ext: normal to inspection, no dependent edema Neuro: CN ii-xii grossly intact, no focal deficits Psyche; anxious affect, cooperative.   ED Course  Procedures (including critical care time) Labs Review  Results for orders placed during the hospital encounter of 09/13/13 (from the past 24 hour(s))  CBG MONITORING, ED     Status: Abnormal   Collection Time    09/13/13  4:50 AM      Result Value Ref Range   Glucose-Capillary 166 (*) 70 - 99 mg/dL  I-STAT TROPOININ, ED     Status: Abnormal   Collection Time    09/13/13  5:13 AM      Result Value Ref Range   Troponin i, poc 0.13 (*) 0.00 - 0.08 ng/mL   Comment NOTIFIED PHYSICIAN     Comment 3           CBC WITH DIFFERENTIAL     Status: Abnormal   Collection Time    09/13/13  6:42 AM      Result Value Ref Range   WBC 7.2  4.0 - 10.5 K/uL   RBC 3.47 (*) 4.22 - 5.81 MIL/uL   Hemoglobin 11.3 (*) 13.0 - 17.0 g/dL   HCT 34.4 (*) 39.0 - 52.0 %   MCV 99.1  78.0 - 100.0 fL   MCH 32.6  26.0 - 34.0 pg   MCHC 32.8  30.0 - 36.0 g/dL   RDW 15.1  11.5 - 15.5 %   Platelets 181  150 - 400 K/uL   Neutrophils Relative % 77  43 - 77 %   Neutro Abs 5.5  1.7 - 7.7 K/uL   Lymphocytes Relative 14  12 - 46 %   Lymphs Abs 1.0  0.7 - 4.0 K/uL   Monocytes Relative 8  3 - 12 %    Monocytes Absolute 0.6  0.1 - 1.0 K/uL   Eosinophils Relative 1  0 - 5 %   Eosinophils Absolute 0.1  0.0 - 0.7 K/uL   Basophils Relative 0  0 - 1 %   Basophils Absolute 0.0  0.0 - 0.1 K/uL      DG Chest Portable 1 View (Final result)  Result time: 09/13/13 05:47:18    Final result by Rad Results In Interface (09/13/13 05:47:18)    Narrative:   CLINICAL DATA: Shortness of breath and chest pain.  EXAM: PORTABLE CHEST - 1 VIEW  COMPARISON: CT CHEST W/O CM dated 09/05/2013; DG CHEST 1V PORT dated 09/05/2013  FINDINGS: Shallow inspiration. Small left pleural effusion. Atelectasis or consolidation in the lung bases. Heart size and pulmonary vascularity are normal. Cardiac pacemaker.  IMPRESSION: Atelectasis or infiltration in the lung bases with probable small left pleural effusion.   Electronically Signed By: [name] M.D. On: 09/13/2013 05:47      EKG: paced rhythm, wide qrs, normal axis, normal intervals, no acute ischemic changes, unchanged from most recent EKG  CRITICAL CARE Performed by: Elyn Peers   Total critical care time:75m Critical care time was exclusive of separately billable procedures and treating other patients.  Critical care was necessary to treat or prevent imminent or life-threatening deterioration.  Critical care was time spent personally by me on the following activities: development of treatment plan with patient and/or surrogate as well as nursing, discussions with consultants, evaluation of patient's response to treatment, examination of patient, obtaining history from patient or surrogate, ordering  and performing treatments and interventions, ordering and review of laboratory studies, ordering and review of radiographic studies, pulse oximetry and re-evaluation of patient's condition.   MDM   Final diagnoses:  Acute respiratory failure  Congestive heart failure  Pulmonary edema  Hypertensive crisis   We are treating with NTG  gtt and IV lasix. Patient started on BiPAP. Labs pending. We will reassess frequently for response to above interventions and determine unit for disposition accordingly.   0700: excellent response to BiPAP and NTG gtt. BP now within normal limits and the patient has been weaned of BiPAP but is still requiring 2L of O2 via [place] to maintain sats in the mid 90s. We are awaiting labs and will admit the patient.     Elyn Peers, MD 09/13/13 [phone]  Case discussed with Dr. Domenic Polite who will see and evaluate the patient.   Elyn Peers, MD 09/13/13 [phone]

## 2013-09-13 NOTE — ED Notes (Signed)
Pt is talking in complete sentences. Pt feels much better, pt taken off BiPAP.

## 2013-09-13 NOTE — ED Notes (Signed)
Pt dropping to 88%RA and placed on 2L of O2, stats increase to 95%. Pt laying on right side.

## 2013-09-13 NOTE — ED Notes (Signed)
Cardiology at bedside to eval pt 

## 2013-09-13 NOTE — ED Notes (Signed)
Gabriel Cirri, RN interrogated pacemaker

## 2013-09-13 NOTE — ED Notes (Signed)
Dr Cheri Guppy given a copy of troponin results .[place]

## 2013-09-13 NOTE — Progress Notes (Signed)
PPM Interrogated No significant atrial arrhythmias since January 2015, no recent mode switches. He is AV pacing almost 100% of time. Upper rate 100. Previously he was A-pacing but not V-pacing 100% of the time. They will ck him again in am, does not have home monitor hooked up, they will discuss with him in am.

## 2013-09-13 NOTE — H&P (Signed)
History and Physical   Patient ID: [name] MRN: [id number], DOB/AGE: [birth year] 78 y.o. Date of Encounter: 09/13/2013  Primary Physician: [name], MD Primary Cardiologist: [name]  Chief Complaint:  Shortness of breath  HPI: [name] is a 80 y.o. male with a history of CAD and CHF. He is compliant with his medications and vigilant regarding his diet (with help from his caregiver). He had some emotional stress 2 days ago and lost some sleep, so he was a little tired yesterday, not doing much. He had no specific complaints of pain or shortness of breath. He took all of his medications as prescribed.   At 1:30 AM today, he was given his hydralazine as scheduled. However, his blood pressure was higher than usual. He was unable to sleep, describing orthopnea and PND. He has a small amount of lower extremity edema but not very much. His weight has trended up over this past week but he has only gained a total of 3 pounds. Overnight, he was not able to rest and could only breathe sitting up. When his symptoms did not improve, he came to the emergency room. In the emergency room, his symptoms are greatly improved by Lasix 80 mg IV x1. He is currently on IV nitroglycerin at 5 mcg per minute and oxygen. He is resting comfortably.  He has not had any chest pain. He denies dietary indiscretion. He has not had any recent travel or prolonged immobility. He has not had any palpitations. He states that he is usually sinus rhythm with ventricular pacing but his last ECG is from 04/30 and shows AV pacing.   He has had recent problems with urinary retention and a chest pain. He came to the ER on 04/25 and a Foley catheter was inserted. It was removed as an outpatient by Dr. Janice Norrie but he again developed urinary retention so he came back to the ER on 04/30 where a Foley catheter was reinserted. Since then, the catheter has stayed in place and he has had no more problems. He has  otherwise been in his usual state of health and doing well.  Past Medical History  Diagnosis Date  . Diabetes mellitus   . Hyperlipidemia   . Coronary artery disease   . Spinal stenosis   . Pneumonia   . Pacemaker   . Anemia   . Carotid artery disease   . PVD (peripheral vascular disease)   . Hyponatremia     Chronic  . CVA (cerebral infarction)   . CKD (chronic kidney disease) stage 4, GFR 15-29 ml/min   . Long term (current) use of anticoagulants     No bleeding on Eliquis  . Dysrhythmia   . PSVT (paroxysmal supraventricular tachycardia)   . PAF (paroxysmal atrial fibrillation)     Asymptomatic. On Eliquis.  . AV block, 3rd degree     With DDD St. Jude PM, initially placed in 1993 by Dr. Nils Pyle. Device upgrade 10/2002 to DDD, by Dr. Rollene Fare complicated by bleeding.  . Essential hypertension, benign   . CHF (congestive heart failure)   . Chronic diastolic congestive heart failure     ECHO 05/20/12 LVEF estimated by 2D at 55-60%  . Arthritis     Surgical History:  Past Surgical History  Procedure Laterality Date  . Lumbar laminectomy  1995  . Back surgery    . Total knee arthroplasty Left   . Quadriceps tendon repair    . Cataract  extraction    . Carotid endarterectomy    . Yag laser application Right 05/23/3157    Procedure: YAG LASER CAPSULOTOMY OF RIGHT EYE;  Surgeon: [name], MD;  Location: [place];  Service: Ophthalmology;  Laterality: Right;  . Tonsillectomy    . Insert / replace / remove pacemaker      DDD St. Jude PM, initially placed in 1993 by Dr. Nils Pyle. Device upgrade 10/2002 to DDD, by Dr. Rollene Fare complicated by bleeding.  . Carotid endarterectomy Right 2006  . Transurethral resection of prostate    . Cardioversion N/A 06/16/2013    Procedure: CARDIOVERSION BEDSIDE;  Surgeon: [name], MD;  Location: [facility] OR;  Service: Cardiovascular;  Laterality: N/A;     I have reviewed the patient's current medications. Prior to Admission  medications   Medication Sig Start Date End Date Taking? Authorizing Provider  allopurinol (ZYLOPRIM) 100 MG tablet Take 100 mg by mouth at bedtime.   Yes Historical Provider, MD  amiodarone (PACERONE) 200 MG tablet Take 200 mg by mouth daily. 06/22/13  Yes Belva Crome III, MD  apixaban (ELIQUIS) 2.5 MG TABS tablet Take 2.5 mg by mouth 2 (two) times daily.   Yes Historical Provider, MD  dutasteride (AVODART) 0.5 MG capsule Take 0.5 mg by mouth daily.    Yes Historical Provider, MD  ferrous sulfate 325 (65 FE) MG tablet Take 325 mg by mouth every other day.    Yes Historical Provider, MD  hydrALAZINE (APRESOLINE) 25 MG tablet Take 25 mg by mouth every 8 (eight) hours.   Yes Historical Provider, MD  insulin glargine (LANTUS) 100 UNIT/ML injection Inject 11-12 Units into the skin at bedtime.    Yes Historical Provider, MD  insulin lispro (HUMALOG) 100 UNIT/ML injection Inject 2-9 Units into the skin 3 (three) times daily before meals. 9 units with breakfast, 2-3 units at lunch & 9 units after supper   Yes Historical Provider, MD  isosorbide dinitrate (ISORDIL) 20 MG tablet Take 1 tablet (20 mg total) by mouth 2 (two) times daily. 08/14/13  Yes Belva Crome III, MD  metoprolol tartrate (LOPRESSOR) 25 MG tablet Take 1 tablet (25 mg total) by mouth daily. 08/17/13  Yes [name], MD  Multiple Vitamin (MULTIVITAMIN WITH MINERALS) TABS Take 1 tablet by mouth daily. Diabetic pak by Petra Kuba from [employer]   Yes Historical Provider, MD  Polyethyl Glycol-Propyl Glycol (SYSTANE) 0.4-0.3 % SOLN Place 1 drop into both eyes daily as needed (dry eyes). Into affected eyes   Yes Historical Provider, MD  polyethylene glycol (MIRALAX / GLYCOLAX) packet Take 17 g by mouth 2 (two) times daily as needed for mild constipation. For constipation   Yes Historical Provider, MD  Polyvinyl Alcohol-Povidone (REFRESH OP) Place 1 drop into both eyes 2 (two) times daily. Refresh Gel Opth   Yes Historical Provider, MD  potassium  chloride SA (K-DUR,KLOR-CON) 20 MEQ tablet Take 20 mEq by mouth daily. 08/25/13  Yes Belva Crome III, MD  Tafluprost (ZIOPTAN) 0.0015 % SOLN Place 1 drop into both eyes at bedtime. Use at night   Yes Historical Provider, MD  torsemide (DEMADEX) 20 MG tablet Take 80 mg by mouth daily. Take 39m in the morning total of 4 pills. 08/14/13  Yes HBelva CromeIII, MD   Scheduled Meds:  Continuous Infusions: . nitroGLYCERIN 5 mcg/min (09/13/13 0446)   Allergies:  Allergies  Allergen Reactions  . Aggrenox [Aspirin-Dipyridamole Er] Other (See Comments)  Dizziness  . Carvedilol Other (See Comments)    Dizziness  . Claritin [Loratadine] Other (See Comments)    Dizziness & drowsiness   . Mucinex [Guaifenesin Er] Other (See Comments)    Dizziness, drowsiness  . Plavix [Clopidogrel Bisulfate] Other (See Comments)    Dizziness  . Rapaflo [Silodosin] Other (See Comments)    Dizziness for about 30- 45  minutes  . Statins Other (See Comments)    rhabdomyolisis  . Travatan Z [Travoprost] Other (See Comments)    Eye irritation    History   Social History  . Marital Status: Widowed    Spouse Name: N/A    Number of Children: 7  . Years of Education: N/A   Occupational History  . MD    Social History Main Topics  . Smoking status: Never Smoker   . Smokeless tobacco: Never Used  . Alcohol Use: No     Comment: Cocktails occasionally  . Drug Use: No  . Sexual Activity: Not Currently   Other Topics Concern  . Not on file   Social History Narrative   Has a caregiver that lives with him. Still works regularly.    Family History  Problem Relation Age of Onset  . Multiple myeloma Father   . Hypertension Mother    Family Status  Relation Status Death Age  . Father Deceased 9  . Mother Deceased 55  . Sister Alive   . Sister Alive   . Sister Alive     Review of Systems:   Full 14-point review of systems otherwise negative except as noted above.  Physical Exam: Blood pressure  122/51, pulse 62, temperature 96.9 F (36.1 C), temperature source Axillary, resp. rate 19, SpO2 97.00%. General: Well developed, well nourished,male in no acute distress. Head: Normocephalic, atraumatic, sclera non-icteric, no xanthomas, nares are without discharge. Dentition: good Neck: No carotid bruits. JVD at 9 am. No thyromegally Lungs: Good expansion bilaterally. without wheezes or rhonchi. Rales bases Heart: Regular rate and rhythm with S1 S2.  No S3 or S4.  No murmur, no rubs, or gallops appreciated. Abdomen: Soft, non-tender, non-distended with normoactive bowel sounds. No hepatomegaly. No rebound/guarding. No obvious abdominal masses. Msk:  Strength and tone appear normal for age. No joint deformities or effusions, no spine or costo-vertebral angle tenderness. Extremities: No clubbing or cyanosis. No edema.  Distal pedal pulses are 2+ in 4 extrem Neuro: Alert and oriented X 3. Moves all extremities spontaneously. No focal deficits noted. Psych:  Responds to questions appropriately with a normal affect. Skin: No rashes or lesions noted  Labs:   Lab Results  Component Value Date   WBC 7.2 09/13/2013   HGB 11.3* 09/13/2013   HCT 34.4* 09/13/2013   MCV 99.1 09/13/2013   PLT 181 09/13/2013     Recent Labs Lab 09/13/13 0642  NA 139  K 4.4  CL 96  CO2 32  BUN 42*  CREATININE 2.22*  CALCIUM 9.1  GLUCOSE 165*    Recent Labs  09/13/13 0513  TROPIPOC 0.13*   Pro B Natriuretic peptide (BNP)  Date/Time Value Ref Range Status  09/13/2013  6:42 AM 3231.0* 0 - 450 pg/mL Final  09/05/2013  8:35 AM 2905.0* 0 - 450 pg/mL Final    Radiology/Studies: Dg Chest Portable 1 View 09/13/2013   CLINICAL DATA:  Shortness of breath and chest pain.  EXAM: PORTABLE CHEST - 1 VIEW  COMPARISON:  CT CHEST W/O CM dated 09/05/2013; DG CHEST 1V PORT dated 09/05/2013  FINDINGS:  Shallow inspiration. Small left pleural effusion. Atelectasis or consolidation in the lung bases. Heart size and pulmonary vascularity  are normal. Cardiac pacemaker.  IMPRESSION: Atelectasis or infiltration in the lung bases with probable small left pleural effusion.   Electronically Signed   By: [name] M.D.   On: 09/13/2013 05:47     Cardiac Cath:  Echo:   ECG: Atrioventricular pacing  ASSESSMENT AND PLAN:  Principal Problem:   Acute on chronic combined CHF (congestive heart failure) - admit, diuresis, adjust medical regimen, follow weights , Renal function and electrolytes closely  Active Problems:   Hypertension - continue IV nitroglycerin for now, BP should improve with diuresis    Diabetes mellitus - add sliding scale insulin, continue bland    CKD (chronic kidney disease), stage III - approaching stage IV, followed carefully    Chronic anticoagulation - continue Eliquis    Pacemaker - St Jude - may need interrogation for optimization   Signed, Lonn Georgia, PA-C. [facility] [phone]   Attending note:  Patient seen and examined. Reviewed records and discussed the case with Ms. Ahmed Prima PA-C. 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 with a history of ischemic heart disease, chronic combined heart failure with LVEF 45-50% in February 2015, PAF and PSVT, chronically anticoagulated with Eliquis, and CKD stage 4. He has a St. Jude dual-chamber pacemaker as well with history of heart block. He reports compliance with his medications, also assistance from a caregiver. Has had some recent problems with urinary retention requiring Foley catheter, otherwise stable. In early morning hours he awoke, states that he had he had shifted in the bed somewhat, and and experienced a discomfort in his chest associated with shortness of breath. Noted to be hypertensive, confirmed in the ER, required limited use of BiPAP, given Lasix and IV nitroglycerin with improvement in symptoms. Troponin I level 0.13 with pro-BNP 3231. ECG shows ventricular paced rhythm with apparent atrial sensing. Chest x-ray with probable basilar  atelectasis and small left pleural effusion. I see that hydralazine dose had been cut back recently, patient was having some dizziness with this. Otherwise no change in regimen. Plan is to admit him to the hospital for further observation. We will try and wean him off IV nitroglycerin, can always increase baseline cardiac medications as tolerated - he is on Lopressor, hydralazine, and Isordil. Diuretic at home is Demadex. We will convert to IV Lasix here, he responded well to initial dose in the ER. Cycle full set of cardiac markers to further evaluate for potential ACS. May also want to interrogate pacemaker to make sure he is not having any breakthrough arrhythmias that could be precipitating symptoms.  Satira Sark, M.D., F.A.C.C.

## 2013-09-13 NOTE — ED Notes (Signed)
Per Patient's wife he has been SOB for the past 2 hours. pt was stating 99%-98% at home according to wife. Pt states that he is also having CP. Pt was placed on NRB by triage and brought straight back. Pt states are 100%NRB.

## 2013-09-14 ENCOUNTER — Encounter (HOSPITAL_COMMUNITY): Payer: Self-pay | Admitting: General Practice

## 2013-09-14 DIAGNOSIS — Z950 Presence of cardiac pacemaker: Secondary | ICD-10-CM

## 2013-09-14 DIAGNOSIS — N189 Chronic kidney disease, unspecified: Secondary | ICD-10-CM

## 2013-09-14 DIAGNOSIS — N179 Acute kidney failure, unspecified: Secondary | ICD-10-CM

## 2013-09-14 LAB — GLUCOSE, CAPILLARY
Glucose-Capillary: 119 mg/dL — ABNORMAL HIGH (ref 70–99)
Glucose-Capillary: 211 mg/dL — ABNORMAL HIGH (ref 70–99)
Glucose-Capillary: 129 mg/dL — ABNORMAL HIGH (ref 70–99)
Glucose-Capillary: 136 mg/dL — ABNORMAL HIGH (ref 70–99)

## 2013-09-14 LAB — COMPREHENSIVE METABOLIC PANEL
ALK PHOS: 52 U/L (ref 39–117)
ALT: 16 U/L (ref 0–53)
AST: 27 U/L (ref 0–37)
Albumin: 3.2 g/dL — ABNORMAL LOW (ref 3.5–5.2)
BILIRUBIN TOTAL: 0.3 mg/dL (ref 0.3–1.2)
BUN: 38 mg/dL — ABNORMAL HIGH (ref 6–23)
CHLORIDE: 98 meq/L (ref 96–112)
CO2: 32 meq/L (ref 19–32)
Calcium: 9.3 mg/dL (ref 8.4–10.5)
Creatinine, Ser: 2.12 mg/dL — ABNORMAL HIGH (ref 0.50–1.35)
GFR calc non Af Amer: 25 mL/min — ABNORMAL LOW (ref >90)
GFR, EST AFRICAN AMERICAN: 29 mL/min — AB (ref >90)
GLUCOSE: 153 mg/dL — AB (ref 70–99)
POTASSIUM: 4.2 meq/L (ref 3.7–5.3)
Sodium: 141 mEq/L (ref 137–147)
Total Protein: 6.8 g/dL (ref 6.0–8.3)

## 2013-09-14 LAB — TROPONIN I: Troponin I: <0.3 ng/mL (ref <0.30)

## 2013-09-14 LAB — HEMOGLOBIN A1C
Hgb A1c MFr Bld: 7.9 % — ABNORMAL HIGH (ref <5.7)
Mean Plasma Glucose: 180 mg/dL — ABNORMAL HIGH (ref <117)

## 2013-09-14 MED ORDER — TORSEMIDE 20 MG PO TABS
80.0000 mg | ORAL_TABLET | Freq: Every day | ORAL | Status: DC
  Administered 2013-09-15: 80 mg via ORAL
  Filled 2013-09-14: qty 4

## 2013-09-14 MED ORDER — TORSEMIDE 20 MG PO TABS
40.0000 mg | ORAL_TABLET | Freq: Every day | ORAL | Status: DC
  Administered 2013-09-14: 40 mg via ORAL
  Filled 2013-09-14 (×2): qty 2

## 2013-09-14 NOTE — Progress Notes (Signed)
Patient Profile: 80 y/o male with a h/o CAD, chronic combined systolic + diastolic CHF (EF 24-40% on echo 06/2013), HTN, PAF, on chronic anticoagulation with Eliquis, PPM and CKD, admitted on 5/3 for A/C CHF.  Subjective: Pt states that he feels significantly better. He denies any further dyspnea. He states he slept w/o supplemental O2 last PM. He is lying flat in bed and denies any further orthopnea and PND. No chest pain.   Objective: Vital signs in last 24 hours: Temp:  [97.6 F (36.4 C)-98.9 F (37.2 C)] 98.9 F (37.2 C) (05/04 0500) Pulse Rate:  [60-68] 62 (05/04 0500) Resp:  [13-19] 16 (05/04 0500) BP: (121-149)/(46-80) 121/46 mmHg (05/04 0500) SpO2:  [98 %-100 %] 98 % (05/04 0500) Weight:  [192 lb (87.091 kg)] 192 lb (87.091 kg) (05/04 0500) Last BM Date: 09/13/13  Intake/Output from previous day: 05/03 0701 - 05/04 0700 In: 276.4 [P.O.:240; I.V.:36.4] Out: 2126 [Urine:2125; Stool:1] Intake/Output this shift:    Medications Current Facility-Administered Medications  Medication Dose Route Frequency Provider Last Rate Last Dose  . acetaminophen (TYLENOL) tablet 650 mg  650 mg Oral Q4H PRN Rhonda G Barrett, PA-C      . allopurinol (ZYLOPRIM) tablet 100 mg  100 mg Oral QHS Rhonda G Barrett, PA-C   100 mg at 09/13/13 2309  . ALPRAZolam Duanne Moron) tablet 0.25 mg  0.25 mg Oral BID PRN Evelene Croon Barrett, PA-C      . amiodarone (PACERONE) tablet 200 mg  200 mg Oral Daily Rhonda G Barrett, PA-C   200 mg at 09/13/13 1254  . apixaban (ELIQUIS) tablet 2.5 mg  2.5 mg Oral BID Evelene Croon Barrett, PA-C   2.5 mg at 09/13/13 2309  . dutasteride (AVODART) capsule 0.5 mg  0.5 mg Oral Daily Rhonda G Barrett, PA-C   0.5 mg at 09/13/13 1255  . ferrous sulfate tablet 325 mg  325 mg Oral QODAY Rhonda G Barrett, PA-C   325 mg at 09/13/13 1255  . furosemide (LASIX) injection 80 mg  80 mg Intravenous BID Evelene Croon Barrett, PA-C   80 mg at 09/13/13 1814  . hydrALAZINE (APRESOLINE) tablet 25 mg  25 mg Oral 3  times per day Lonn Georgia, PA-C   25 mg at 09/13/13 2309  . insulin aspart (novoLOG) injection 0-15 Units  0-15 Units Subcutaneous TID WC Evelene Croon Barrett, PA-C   3 Units at 09/13/13 1808  . insulin glargine (LANTUS) injection 11 Units  11 Units Subcutaneous QHS Evelene Croon Barrett, PA-C   11 Units at 09/13/13 2308  . latanoprost (XALATAN) 0.005 % ophthalmic solution 1 drop  1 drop Both Eyes QHS Rhonda G Barrett, PA-C   1 drop at 09/13/13 2309  . metoprolol tartrate (LOPRESSOR) tablet 25 mg  25 mg Oral Daily Rhonda G Barrett, PA-C   25 mg at 09/13/13 1258  . multivitamin with minerals tablet 1 tablet  1 tablet Oral Daily Rhonda G Barrett, PA-C      . nitroGLYCERIN (NITROSTAT) SL tablet 0.4 mg  0.4 mg Sublingual Q5 Min x 3 PRN Rhonda G Barrett, PA-C      . nitroGLYCERIN 0.2 mg/mL in dextrose 5 % infusion  10-200 mcg/min Intravenous Titrated Elyn Peers, MD 1.5 mL/hr at 09/13/13 0446 5 mcg/min at 09/13/13 0446  . ondansetron (ZOFRAN) injection 4 mg  4 mg Intravenous Q6H PRN Rhonda G Barrett, PA-C      . polyethylene glycol (MIRALAX / GLYCOLAX) packet 17 g  17 g Oral BID PRN Suanne Marker  G Barrett, PA-C      . polyvinyl alcohol (LIQUIFILM TEARS) 1.4 % ophthalmic solution 1 drop  1 drop Both Eyes PRN Satira Sark, MD      . potassium chloride SA (K-DUR,KLOR-CON) CR tablet 20 mEq  20 mEq Oral Daily Rhonda G Barrett, PA-C   20 mEq at 09/13/13 1255  . zolpidem (AMBIEN) tablet 5 mg  5 mg Oral QHS PRN Lonn Georgia, PA-C        PE: General appearance: alert, cooperative and no distress Lungs: bibasilar crackles L>R Heart: regular rate and rhythm Extremities: no LEE Pulses: 2+ and symmetric Skin: warm and dry Neurologic: Grossly normal  Lab Results:   Recent Labs  09/13/13 0642  WBC 7.2  HGB 11.3*  HCT 34.4*  PLT 181   BMET  Recent Labs  09/13/13 0642 09/14/13 0343  NA 139 141  K 4.4 4.2  CL 96 98  CO2 32 32  GLUCOSE 165* 153*  BUN 42* 38*  CREATININE 2.22* 2.12*  CALCIUM 9.1  9.3   Filed Weights   09/14/13 0500  Weight: 192 lb (87.091 kg)     Assessment/Plan  Principal Problem:   Acute on chronic diastolic CHF (congestive heart failure), NYHA class 4 Active Problems:   Hypertension   Diabetes mellitus   CKD (chronic kidney disease), stage III   Chronic anticoagulation   Pacemaker - St Jude  1. Acute on Chronic Combined Systolic + Diastolic CHF - Pt notes significant subjective improvement, but still with left basilar rales that do not completely resolve with cough. Will need additional diuresis before discharge. Continue 80 mg IV Lasix. Continue I/Os. Net negative 2.4 L since admit. Weights not trended. Will start daily weights today. Renal function fairly stable. Electrolytes are WNL.   2. HTN -  Controlled. Continue hydralazine and Lopressor  3. DM - continue insulin  4. CKD - fairly stable. Baseline SCr ~2 and is 2.12 today.  Will need to monitor closely, in the setting of IV diuretic therapy.   5. PAF - Continue Amiodarone and Lopressor for rhythm/ rate control and Eliquis for anticoagulation. Will assess Afib burden when PPM is interrogated.   6. Chronic Anticoagulation - Denies any abnormal bleeding. continue Eliquis  7. Pacemaker - Plan for device interrogation this am     LOS: 1 day    Leory Allinson M. Ladoris Gene 09/14/2013 7:47 AM

## 2013-09-14 NOTE — Care Management Note (Unsigned)
    Page 1 of 1   09/14/2013     4:26:59 PM CARE MANAGEMENT NOTE 09/14/2013  Patient:  [name], [name]   Account Number:  [id number]  Date Initiated:  09/14/2013  Documentation initiated by:  GRAVES-BIGELOW,Jayvian Escoe  Subjective/Objective Assessment:   Pt admitted for CHF exacrbation. Initiated on IV lasix. Pt has a caregiver.     Action/Plan:   CM will continue to monitor for disposition needs.   Anticipated DC Date:  09/17/2013   Anticipated DC Plan:  [place]  CM consult      Choice offered to / List presented to:             Status of service:  In process, will continue to follow Medicare Important Message given?  YES (If response is "NO", the following Medicare IM given date fields will be blank) Date Medicare IM given:  09/13/2013 Date Additional Medicare IM given:    Discharge Disposition:    Per UR Regulation:  Reviewed for med. necessity/level of care/duration of stay  If discussed at [place] of Stay Meetings, dates discussed:    Comments:

## 2013-09-14 NOTE — Progress Notes (Addendum)
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 He has recurrent acute on chronic combined heart failure and we are unable to determine the precipitant of the acute attacks. Could be ischemic. We have excluded RAS and arrhythmia(no afib by pacer tele).Plan one more day of diuresis then consider discharge. Ambulate today. Convert to oral diuretic this PM and home in AM.

## 2013-09-14 NOTE — Progress Notes (Signed)
Bipap order is an old ED order.  RT spoke with pt, and he does not use nor need.

## 2013-09-14 NOTE — Progress Notes (Signed)
UR Completed Brittiny Levitz Graves-Bigelow, RN,BSN [phone]  

## 2013-09-15 ENCOUNTER — Other Ambulatory Visit: Payer: Self-pay | Admitting: Physician Assistant

## 2013-09-15 DIAGNOSIS — N183 Chronic kidney disease, stage 3 unspecified: Secondary | ICD-10-CM

## 2013-09-15 LAB — GLUCOSE, CAPILLARY
Glucose-Capillary: 102 mg/dL — ABNORMAL HIGH (ref 70–99)
Glucose-Capillary: 183 mg/dL — ABNORMAL HIGH (ref 70–99)

## 2013-09-15 LAB — BASIC METABOLIC PANEL
BUN: 35 mg/dL — AB (ref 6–23)
CHLORIDE: 100 meq/L (ref 96–112)
CO2: 30 mEq/L (ref 19–32)
CREATININE: 2.06 mg/dL — AB (ref 0.50–1.35)
Calcium: 9.1 mg/dL (ref 8.4–10.5)
GFR calc Af Amer: 30 mL/min — ABNORMAL LOW (ref >90)
GFR calc non Af Amer: 26 mL/min — ABNORMAL LOW (ref >90)
Glucose, Bld: 112 mg/dL — ABNORMAL HIGH (ref 70–99)
POTASSIUM: 3.7 meq/L (ref 3.7–5.3)
Sodium: 142 mEq/L (ref 137–147)

## 2013-09-15 MED ORDER — TORSEMIDE 20 MG PO TABS: 80.0000 mg | ORAL_TABLET | Freq: Two times a day (BID) | ORAL | Status: DC

## 2013-09-15 NOTE — Progress Notes (Signed)
Patient Name: [name] Date of Encounter: 09/15/2013  Principal Problem:   Acute on chronic diastolic CHF (congestive heart failure), NYHA class 4 Active Problems:   Hypertension   Diabetes mellitus   CKD (chronic kidney disease), stage III   Chronic anticoagulation   Pacemaker - St Jude    Patient Profile: 80 y/o male with a h/o CAD, chronic combined systolic + diastolic CHF (EF 01-02% on echo 06/2013), HTN, PAF, on chronic anticoagulation with Eliquis, PPM and CKD, admitted on 5/3 for A/C CHF.   SUBJECTIVE: Breathing better, has not urinated since Foley came out. Wants D/C. Wants to work Thursday.  OBJECTIVE Filed Vitals:   09/14/13 0824 09/14/13 1417 09/14/13 2100 09/15/13 0500  BP: 121/46 114/54 134/62 116/46  Pulse:  60 64 60  Temp:  98.1 F (36.7 C) 98.7 F (37.1 C) 98.5 F (36.9 C)  TempSrc:  Oral    Resp:  16 16 18   Height:      Weight:    187 lb 12.8 oz (85.186 kg)  SpO2:  98% 97% 99%    Intake/Output Summary (Last 24 hours) at 09/15/13 0945 Last data filed at 09/15/13 0500  Gross per 24 hour  Intake      0 ml  Output   1550 ml  Net  -1550 ml   Filed Weights   09/14/13 0500 09/15/13 0500  Weight: 192 lb (87.091 kg) 187 lb 12.8 oz (85.186 kg)    PHYSICAL EXAM General: Well developed, well nourished, male in no acute distress. Head: Normocephalic, atraumatic.  Neck: Supple without bruits, JVD at 8 cm. Lungs:  Resp regular and unlabored, rales. Heart: RRR, S1, S2, no S3, S4, soft murmur; no rub. Abdomen: Soft, non-tender, non-distended, BS + x 4.  Extremities: No clubbing, cyanosis, no edema.  Neuro: Alert and oriented X 3. Moves all extremities spontaneously. Psych: Normal affect.  LABS: CBC: Recent Labs  09/13/13 0642  WBC 7.2  NEUTROABS 5.5  HGB 11.3*  HCT 34.4*  MCV 99.1  PLT 725   Basic Metabolic Panel: Recent Labs  09/14/13 0343 09/15/13 0615  NA 141 142  K 4.2 3.7  CL 98 100  CO2 32 30  GLUCOSE 153* 112*  BUN 38*  35*  CREATININE 2.12* 2.06*  CALCIUM 9.3 9.1   Liver Function Tests: Recent Labs  09/14/13 0343  AST 27  ALT 16  ALKPHOS 52  BILITOT 0.3  PROT 6.8  ALBUMIN 3.2*   Cardiac Enzymes: Recent Labs  09/13/13 1500 09/13/13 2040 09/14/13 0345  TROPONINI <0.30 <0.30 <0.30    Recent Labs  09/13/13 0513  TROPIPOC 0.13*   BNP: Pro B Natriuretic peptide (BNP)  Date/Time Value Ref Range Status  09/13/2013  6:42 AM 3231.0* 0 - 450 pg/mL Final  09/05/2013  8:35 AM 2905.0* 0 - 450 pg/mL Final   Hemoglobin A1C: Recent Labs  09/13/13 1644  HGBA1C 7.9*   TELE: A. pacing     Radiology/Studies: No results found.   Current Medications:  . allopurinol  100 mg Oral QHS  . amiodarone  200 mg Oral Daily  . apixaban  2.5 mg Oral BID  . dutasteride  0.5 mg Oral Daily  . ferrous sulfate  325 mg Oral QODAY  . hydrALAZINE  25 mg Oral 3 times per day  . insulin aspart  0-15 Units Subcutaneous TID WC  . insulin glargine  11 Units Subcutaneous QHS  . latanoprost  1 drop Both Eyes QHS  .  metoprolol tartrate  25 mg Oral Daily  . multivitamin with minerals  1 tablet Oral Daily  . potassium chloride SA  20 mEq Oral Daily  . torsemide  40 mg Oral Daily  . torsemide  80 mg Oral Daily   . nitroGLYCERIN 5 mcg/min (09/13/13 0446)    ASSESSMENT AND PLAN: Principal Problem:   Acute on chronic diastolic CHF (congestive heart failure), NYHA class 4 - weight down 5 lbs since admit, back on oral meds. Will recheck CXR, get pt to ambulate, see how tolerated. Think he is a little wet, but may be at baseline.  Active Problems:   Hypertension - d/c NTG gtt, follow; improved w/ diuresis    Diabetes mellitus - A1c improved, follow    CKD (chronic kidney disease), stage III - Cr stable w/ diuresis    Chronic anticoagulation - on Eliquis    Pacemaker - St Jude - functioning well, no significant arrhythmia.   Lemont Fillers , PA-C 9:45 AM 09/15/2013

## 2013-09-15 NOTE — Progress Notes (Signed)
See the prior note.

## 2013-09-15 NOTE — Discharge Summary (Signed)
Discharge plans and f/u arranged under my guidance. Plan to discharge and f/u in 5-7 days.

## 2013-09-15 NOTE — Discharge Summary (Signed)
CARDIOLOGY DISCHARGE SUMMARY   Patient ID: [name] MRN: [id number] DOB/AGE: Aug 13, 1918 80 y.o.  Admit date: 09/13/2013 Discharge date: 09/15/2013  PCP: [name], MD Primary Cardiologist: [name]  Primary Discharge Diagnosis:  Acute on chronic diastolic CHF, class IV Secondary Discharge Diagnosis:    Hypertension   Diabetes mellitus   CKD (chronic kidney disease), stage III   Chronic anticoagulation   Pacemaker - St Jude   Urinary retention  Consults: Dr. Janice Norrie  Procedures: Foley catheter removal  Hospital Course: [name] is a 80 y.o. male with a history of diastolic CHF. He woke with shortness of breath and came to the hospital where he was found to be hypertensive and in heart failure. He was admitted for further evaluation and treatment.  He was diuresed with IV Lasix at 80 mg IV twice a day and lost 5 pounds during his hospital stay. With the diuresis, his respiratory status improved. By discharge, his oxygen saturation was 99% on room air.  His pacemaker was interrogated to determine if rhythm changes had influenced his increase in volume. However, he was maintaining atrial sensing and ventricular pacing, no changes indicated and no recent mode switches.  He had a Foley catheter on admission and this was continued. He was seen by Dr. Janice Norrie while he was in the hospital who removed the catheter. He will followup with Dr. Janice Norrie as an outpatient.  His renal function was followed closely during his hospital stay. His potassium was supplemented. His discharge labs are below and his BUN and creatinine are at baseline. His blood sugars were monitored closely during his hospital stay. A hemoglobin A1c was checked and was elevated at 7.9. However this is decreased from his last A1c and he will followup with primary care for this.  He has problems with constipation and takes MiraLAX for this. He is to add Colace to his medication regimen and discuss any additional  problems with his GI physician.  His blood pressure was significantly elevated on admission and he was started on IV nitroglycerin to help with his volume and with his blood pressure. However, his blood pressure improved with diuresis and the nitroglycerin was discontinued.  On 05/05, he was seen by [name] and all data were reviewed. His discharge diuretic dose is to be torsemide 80 mg twice a day. He will continue to track his weight and get lab work done early next week. No further inpatient workup is indicated and he is considered stable for discharge, to followup as an outpatient.  Labs:   Lab Results  Component Value Date   WBC 7.2 09/13/2013   HGB 11.3* 09/13/2013   HCT 34.4* 09/13/2013   MCV 99.1 09/13/2013   PLT 181 09/13/2013     Recent Labs Lab 09/14/13 0343 09/15/13 0615  NA 141 142  K 4.2 3.7  CL 98 100  CO2 32 30  BUN 38* 35*  CREATININE 2.12* 2.06*  CALCIUM 9.3 9.1  PROT 6.8  --   BILITOT 0.3  --   ALKPHOS 52  --   ALT 16  --   AST 27  --   GLUCOSE 153* 112*    Recent Labs  09/13/13 1500 09/13/13 2040 09/14/13 0345  TROPONINI <0.30 <0.30 <0.30    Pro B Natriuretic peptide (BNP)  Date/Time Value Ref Range Status  09/13/2013  6:42 AM 3231.0* 0 - 450 pg/mL Final  09/05/2013  8:35 AM 2905.0* 0 - 450 pg/mL Final  Radiology:  Dg Chest Portable 1 View 09/13/2013   CLINICAL DATA:  Shortness of breath and chest pain.  EXAM: PORTABLE CHEST - 1 VIEW  COMPARISON:  CT CHEST W/O CM dated 09/05/2013; DG CHEST 1V PORT dated 09/05/2013  FINDINGS: Shallow inspiration. Small left pleural effusion. Atelectasis or consolidation in the lung bases. Heart size and pulmonary vascularity are normal. Cardiac pacemaker.  IMPRESSION: Atelectasis or infiltration in the lung bases with probable small left pleural effusion.   Electronically Signed   By: [name] M.D.   On: 09/13/2013 05:47   EKG: Atrial sensed, ventricular paced rhythm  FOLLOW UP PLANS AND  APPOINTMENTS Allergies  Allergen Reactions  . Aggrenox [Aspirin-Dipyridamole Er] Other (See Comments)    Dizziness  . Carvedilol Other (See Comments)    Dizziness  . Claritin [Loratadine] Other (See Comments)    Dizziness & drowsiness   . Mucinex [Guaifenesin Er] Other (See Comments)    Dizziness, drowsiness  . Plavix [Clopidogrel Bisulfate] Other (See Comments)    Dizziness  . Rapaflo [Silodosin] Other (See Comments)    Dizziness for about 30- 45  minutes  . Statins Other (See Comments)    rhabdomyolisis  . Travatan Z [Travoprost] Other (See Comments)    Eye irritation     Medication List         allopurinol 100 MG tablet  Commonly known as:  ZYLOPRIM  Take 100 mg by mouth at bedtime.     amiodarone 200 MG tablet  Commonly known as:  PACERONE  Take 200 mg by mouth daily.     dutasteride 0.5 MG capsule  Commonly known as:  AVODART  Take 0.5 mg by mouth daily.     ELIQUIS 2.5 MG Tabs tablet  Generic drug:  apixaban  Take 2.5 mg by mouth 2 (two) times daily.     ferrous sulfate 325 (65 FE) MG tablet  Take 325 mg by mouth every other day.     hydrALAZINE 25 MG tablet  Commonly known as:  APRESOLINE  Take 25 mg by mouth every 8 (eight) hours.     insulin glargine 100 UNIT/ML injection  Commonly known as:  LANTUS  Inject 11-12 Units into the skin at bedtime.     insulin lispro 100 UNIT/ML injection  Commonly known as:  HUMALOG  Inject 2-9 Units into the skin 3 (three) times daily before meals. 9 units with breakfast, 2-3 units at lunch & 9 units after supper     isosorbide dinitrate 20 MG tablet  Commonly known as:  ISORDIL  Take 1 tablet (20 mg total) by mouth 2 (two) times daily.     metoprolol tartrate 25 MG tablet  Commonly known as:  LOPRESSOR  Take 1 tablet (25 mg total) by mouth daily.     multivitamin with minerals Tabs tablet  Take 1 tablet by mouth daily. Diabetic pak by Petra Kuba from [employer]     polyethylene glycol packet  Commonly known as:   MIRALAX / GLYCOLAX  Take 17 g by mouth 2 (two) times daily as needed for mild constipation. For constipation     potassium chloride SA 20 MEQ tablet  Commonly known as:  K-DUR,KLOR-CON  Take 20 mEq by mouth daily.     REFRESH OP  Place 1 drop into both eyes 2 (two) times daily. Refresh Gel Opth     SYSTANE 0.4-0.3 % Soln  Generic drug:  Polyethyl Glycol-Propyl Glycol  Place 1 drop into both eyes daily as needed (dry  eyes). Into affected eyes     torsemide 20 MG tablet  Commonly known as:  DEMADEX  Take 4 tablets (80 mg total) by mouth 2 (two) times daily. Take 80mg  in the morning total of 4 pills.     ZIOPTAN 0.0015 % Soln  Generic drug:  Tafluprost  Place 1 drop into both eyes at bedtime. Use at night        Discharge Orders   Future Appointments Provider Department Dept Phone   09/18/2013 11:30 AM Juanito Doom, MD [facility] [phone]   10/06/2013 10:00 AM [name], MD [facility] [phone]   Future Orders Complete By Expires   Diet - low sodium heart healthy  As directed    Diet Carb Modified  As directed    Increase activity slowly  As directed      Follow-up Information   Follow up with [name], MD On 09/21/2013. (The office will call with a time. Lab work on Monday also.)    Specialty:  Cardiology   Contact information:   [address] [address] [phone]       BRING ALL MEDICATIONS WITH YOU TO FOLLOW UP APPOINTMENTS  Time spent with patient to include physician time: 44 min Signed: Lonn Georgia, PA-C 09/15/2013, 11:40 AM Co-Sign MD

## 2013-09-15 NOTE — Progress Notes (Signed)
Patient ID: [name], male   DOB: [birth year], 80 y.o.   MRN: [id number]  Have removed Foley catheter this morning. Will reinsert catheter if unable to void.

## 2013-09-18 ENCOUNTER — Inpatient Hospital Stay: Payer: Medicare Other | Admitting: Pulmonary Disease

## 2013-09-21 ENCOUNTER — Ambulatory Visit (INDEPENDENT_AMBULATORY_CARE_PROVIDER_SITE_OTHER): Payer: Medicare Other | Admitting: Interventional Cardiology

## 2013-09-21 ENCOUNTER — Encounter: Payer: Self-pay | Admitting: Interventional Cardiology

## 2013-09-21 VITALS — BP 137/67 | HR 60 | Ht 67.0 in | Wt 187.3 lb

## 2013-09-21 DIAGNOSIS — N183 Chronic kidney disease, stage 3 unspecified: Secondary | ICD-10-CM

## 2013-09-21 DIAGNOSIS — I4891 Unspecified atrial fibrillation: Secondary | ICD-10-CM

## 2013-09-21 DIAGNOSIS — I480 Paroxysmal atrial fibrillation: Secondary | ICD-10-CM

## 2013-09-21 DIAGNOSIS — I10 Essential (primary) hypertension: Secondary | ICD-10-CM

## 2013-09-21 DIAGNOSIS — I5032 Chronic diastolic (congestive) heart failure: Secondary | ICD-10-CM

## 2013-09-21 NOTE — Patient Instructions (Signed)
Your physician recommends that you continue on your current medications as directed. Please refer to the Current Medication list given to you today.  Keep your appt previously scheduled for 10/06/13 @ 10am

## 2013-09-21 NOTE — Progress Notes (Signed)
Patient ID: [name], male   DOB: 1919/03/19, 80 y.o.   MRN: [id number]    [address] [place], [place]  [address] Phone: [phone] Fax:  [phone]  Date:  09/21/2013   ID:  [name], DOB January 29, 1919, MRN [id number]  PCP:  [name], MD   ASSESSMENT:  1. Chronic diastolic heart failure, euvolemic on today's exam 2. History of atrial fibrillation with rhythm control on amiodarone 3. Amiodarone therapy, without obvious side effects 4. Hypertension  PLAN:  1. continue torsemide 80 mg a.m. and 40 mg p.m. 2. Clinical followup in 2 weeks 3. Check results of the basic metabolic panel 4. Call earlier if increase in weight   SUBJECTIVE: [name] is a 79 y.o. male who has had recurring admissions do to acute on chronic diastolic heart failure. He was discharged from the hospital approximately 7-8 days ago. He was discharged from the hospital on 80 mg twice a day of torsemide or we have decreased the dose because he was weak and felt dehydrated. He had a good weekend and is currently taking 80 mg a.m. and 40 mg p.m. No chest pain or palpitations have been noted.   Wt Readings from Last 3 Encounters:  09/21/13 187 lb 4.8 oz (84.959 kg)  09/15/13 187 lb 12.8 oz (85.186 kg)  09/10/13 187 lb (84.823 kg)     Past Medical History  Diagnosis Date  . Diabetes mellitus   . Hyperlipidemia   . Coronary artery disease   . Spinal stenosis   . Pneumonia   . Pacemaker   . Anemia   . Carotid artery disease   . PVD (peripheral vascular disease)   . Hyponatremia     Chronic  . CVA (cerebral infarction)   . CKD (chronic kidney disease) stage 4, GFR 15-29 ml/min   . Long term (current) use of anticoagulants     No bleeding on Eliquis  . Dysrhythmia   . PSVT (paroxysmal supraventricular tachycardia)   . PAF (paroxysmal atrial fibrillation)     Asymptomatic. On Eliquis.  . AV block, 3rd degree     With DDD St. Jude PM, initially placed in 1993 by Dr.  Nils Pyle. Device upgrade 10/2002 to DDD, by Dr. Rollene Fare complicated by bleeding.  . Essential hypertension, benign   . CHF (congestive heart failure)   . Chronic diastolic congestive heart failure     ECHO 05/20/12 LVEF estimated by 2D at 55-60%  . Arthritis   . Shortness of breath     Current Outpatient Prescriptions  Medication Sig Dispense Refill  . allopurinol (ZYLOPRIM) 100 MG tablet Take 100 mg by mouth at bedtime.      [name] amiodarone (PACERONE) 200 MG tablet Take 200 mg by mouth daily.      [name] apixaban (ELIQUIS) 2.5 MG TABS tablet Take 2.5 mg by mouth 2 (two) times daily.      [name] dutasteride (AVODART) 0.5 MG capsule Take 0.5 mg by mouth daily.       . ferrous sulfate 325 (65 FE) MG tablet Take 325 mg by mouth every other day.       . hydrALAZINE (APRESOLINE) 25 MG tablet Take 25 mg by mouth every 8 (eight) hours.      . insulin glargine (LANTUS) 100 UNIT/ML injection Inject 11-12 Units into the skin at bedtime.       . insulin lispro (HUMALOG) 100 UNIT/ML injection Inject 2-9 Units into the skin 3 (three) times  daily before meals. 9 units with breakfast, 2-3 units at lunch & 9 units after supper      . isosorbide dinitrate (ISORDIL) 20 MG tablet Take 1 tablet (20 mg total) by mouth 2 (two) times daily.  60 tablet  11  . metoprolol tartrate (LOPRESSOR) 25 MG tablet Take 1 tablet (25 mg total) by mouth daily.  30 tablet  2  . Multiple Vitamin (MULTIVITAMIN WITH MINERALS) TABS Take 1 tablet by mouth daily. Diabetic pak by Petra Kuba from [employer]      . Polyethyl Glycol-Propyl Glycol (SYSTANE) 0.4-0.3 % SOLN Place 1 drop into both eyes daily as needed (dry eyes). Into affected eyes      . polyethylene glycol (MIRALAX / GLYCOLAX) packet Take 17 g by mouth 2 (two) times daily as needed for mild constipation. For constipation      . Polyvinyl Alcohol-Povidone (REFRESH OP) Place 1 drop into both eyes 2 (two) times daily. Refresh Gel Opth      . potassium chloride SA (K-DUR,KLOR-CON) 20 MEQ tablet Take  20 mEq by mouth daily.      [name] RAPAFLO 8 MG CAPS capsule Take 1 capsule by mouth daily.      . Tafluprost (ZIOPTAN) 0.0015 % SOLN Place 1 drop into both eyes at bedtime. Use at night      . torsemide (DEMADEX) 20 MG tablet Take 4 tablets (80 mg total) by mouth 2 (two) times daily. Take 80mg  in the morning total of 4 pills.  240 tablet  11   No current facility-administered medications for this visit.    Allergies:    Allergies  Allergen Reactions  . Aggrenox [Aspirin-Dipyridamole Er] Other (See Comments)    Dizziness  . Carvedilol Other (See Comments)    Dizziness  . Claritin [Loratadine] Other (See Comments)    Dizziness & drowsiness   . Mucinex [Guaifenesin Er] Other (See Comments)    Dizziness, drowsiness  . Plavix [Clopidogrel Bisulfate] Other (See Comments)    Dizziness  . Rapaflo [Silodosin] Other (See Comments)    Dizziness for about 30- 45  minutes  . Statins Other (See Comments)    rhabdomyolisis  . Travatan Z [Travoprost] Other (See Comments)    Eye irritation    Social History:  The patient  reports that he has never smoked. He has never used smokeless tobacco. He reports that he does not drink alcohol or use illicit drugs.   ROS:  Please see the history of present illness.   Feels strong   All other systems reviewed and negative.   OBJECTIVE: VS:  BP 137/67  Pulse 60  Ht 5\' 7"  (1.702 m)  Wt 187 lb 4.8 oz (84.959 kg)  BMI 29.33 kg/m2 Well nourished, well developed, in no acute distress, appears in his stated age 64: normal Neck: JVD flat. Carotid bruit absent  Cardiac:  normal S1, S2; RRR; no murmur Lungs:  clear to auscultation bilaterally, no wheezing, rhonchi or rales Abd: soft, nontender, no hepatomegaly Ext: Edema absent. Pulses 2+ Skin: warm and dry Neuro:  CNs 2-12 intact, no focal abnormalities noted  EKG:  Not repeated       Signed, Illene Labrador III, MD 09/21/2013 4:45 PM

## 2013-09-22 LAB — BASIC METABOLIC PANEL
BUN: 36 mg/dL — ABNORMAL HIGH (ref 6–23)
CO2: 33 mEq/L — ABNORMAL HIGH (ref 19–32)
Calcium: 9.5 mg/dL (ref 8.4–10.5)
Chloride: 97 mEq/L (ref 96–112)
Creatinine, Ser: 2.4 mg/dL — ABNORMAL HIGH (ref 0.4–1.5)
GFR: 32.96 mL/min — ABNORMAL LOW (ref >60.00)
GLUCOSE: 157 mg/dL — AB (ref 70–99)
POTASSIUM: 4.3 meq/L (ref 3.5–5.1)
Sodium: 137 mEq/L (ref 135–145)

## 2013-10-05 ENCOUNTER — Other Ambulatory Visit: Payer: Self-pay | Admitting: Interventional Cardiology

## 2013-10-06 ENCOUNTER — Encounter: Payer: Self-pay | Admitting: Interventional Cardiology

## 2013-10-06 ENCOUNTER — Ambulatory Visit (INDEPENDENT_AMBULATORY_CARE_PROVIDER_SITE_OTHER): Payer: Medicare Other | Admitting: Interventional Cardiology

## 2013-10-06 VITALS — BP 136/62 | HR 72 | Ht 67.0 in | Wt 195.0 lb

## 2013-10-06 DIAGNOSIS — I4891 Unspecified atrial fibrillation: Secondary | ICD-10-CM

## 2013-10-06 DIAGNOSIS — I5032 Chronic diastolic (congestive) heart failure: Secondary | ICD-10-CM

## 2013-10-06 DIAGNOSIS — N184 Chronic kidney disease, stage 4 (severe): Secondary | ICD-10-CM

## 2013-10-06 DIAGNOSIS — I480 Paroxysmal atrial fibrillation: Secondary | ICD-10-CM

## 2013-10-06 DIAGNOSIS — Z7901 Long term (current) use of anticoagulants: Secondary | ICD-10-CM

## 2013-10-06 DIAGNOSIS — I10 Essential (primary) hypertension: Secondary | ICD-10-CM

## 2013-10-06 NOTE — Patient Instructions (Addendum)
Your physician recommends that you continue on your current medications as directed. Please refer to the Current Medication list given to you today.  Your physician recommends that you return for lab work on  11/03/13  You have a follow up appt scheduled for 11/03/13 @ 8:30 am

## 2013-10-06 NOTE — Progress Notes (Signed)
Patient ID: [name], male   DOB: [birth year], 80 y.o.   MRN: [id number]    [address] [place], [place]  [address] Phone: [phone] Fax:  [phone]  Date:  10/06/2013   ID:  [name], DOB January 01, 1919, MRN [id number]  PCP:  [name], MD   ASSESSMENT:  1. Chronic diastolic heart failure, stable with stable weights over the past 2 weeks 2. Chronic renal insufficiency, last creatinine 2.4 3. Hypertension, well-controlled 4. Paroxysmal atrial fibrillation  PLAN:  1. No change in current medical regimen 2. Clinical followup in one month with basic metabolic panel and same time   SUBJECTIVE: [name] is a 80 y.o. male who is doing relatively well. He made an adjustment in torsemide dose to wear he is taking 60 mg twice a day and feels that this is a stable regimen for him. He denies angina, orthopnea, PND, irregular heart rhythm, palpitations, and edema.   Wt Readings from Last 3 Encounters:  10/06/13 195 lb (88.451 kg)  09/21/13 187 lb 4.8 oz (84.959 kg)  09/15/13 187 lb 12.8 oz (85.186 kg)     Past Medical History  Diagnosis Date  . Diabetes mellitus   . Hyperlipidemia   . Coronary artery disease   . Spinal stenosis   . Pneumonia   . Pacemaker   . Anemia   . Carotid artery disease   . PVD (peripheral vascular disease)   . Hyponatremia     Chronic  . CVA (cerebral infarction)   . CKD (chronic kidney disease) stage 4, GFR 15-29 ml/min   . Long term (current) use of anticoagulants     No bleeding on Eliquis  . Dysrhythmia   . PSVT (paroxysmal supraventricular tachycardia)   . PAF (paroxysmal atrial fibrillation)     Asymptomatic. On Eliquis.  . AV block, 3rd degree     With DDD St. Jude PM, initially placed in 1993 by Dr. Nils Pyle. Device upgrade 10/2002 to DDD, by Dr. Rollene Fare complicated by bleeding.  . Essential hypertension, benign   . CHF (congestive heart failure)   . Chronic diastolic congestive heart failure     ECHO  05/20/12 LVEF estimated by 2D at 55-60%  . Arthritis   . Shortness of breath     Current Outpatient Prescriptions  Medication Sig Dispense Refill  . allopurinol (ZYLOPRIM) 100 MG tablet Take 100 mg by mouth at bedtime.      [name] amiodarone (PACERONE) 200 MG tablet Take 200 mg by mouth daily.      [name] apixaban (ELIQUIS) 2.5 MG TABS tablet Take 2.5 mg by mouth 2 (two) times daily.      [name] dutasteride (AVODART) 0.5 MG capsule Take 0.5 mg by mouth daily.       . ferrous sulfate 325 (65 FE) MG tablet Take 325 mg by mouth every other day.       . hydrALAZINE (APRESOLINE) 25 MG tablet Take 25 mg by mouth every 8 (eight) hours.      . insulin glargine (LANTUS) 100 UNIT/ML injection Inject 9-10 Units into the skin at bedtime.       . insulin lispro (HUMALOG) 100 UNIT/ML injection Inject 2-9 Units into the skin 3 (three) times daily before meals. 9 units with breakfast, 2-3 units at lunch & 9 units after supper      . isosorbide dinitrate (ISORDIL) 20 MG tablet Take 1 tablet (20 mg total) by mouth 2 (two) times daily.  [place]  tablet  11  . metoprolol tartrate (LOPRESSOR) 25 MG tablet Take 1 tablet (25 mg total) by mouth daily.  30 tablet  2  . Multiple Vitamin (MULTIVITAMIN WITH MINERALS) TABS Take 1 tablet by mouth daily. Diabetic pak by Petra Kuba from [employer]      . Polyethyl Glycol-Propyl Glycol (SYSTANE) 0.4-0.3 % SOLN Place 1 drop into both eyes daily as needed (dry eyes). Into affected eyes      . polyethylene glycol (MIRALAX / GLYCOLAX) packet Take 17 g by mouth 2 (two) times daily as needed for mild constipation. For constipation      . Polyvinyl Alcohol-Povidone (REFRESH OP) Place 1 drop into both eyes 2 (two) times daily. Refresh Gel Opth      . potassium chloride SA (K-DUR,KLOR-CON) 20 MEQ tablet Take 20 mEq by mouth daily.      [name] RAPAFLO 8 MG CAPS capsule Take 1 capsule by mouth daily.      . Tafluprost (ZIOPTAN) 0.0015 % SOLN Place 1 drop into both eyes at bedtime. Use at night      . torsemide (DEMADEX)  20 MG tablet Take 4 tablets (80 mg total) by mouth 2 (two) times daily. Take 80mg  in the morning total of 4 pills.  240 tablet  11   No current facility-administered medications for this visit.    Allergies:    Allergies  Allergen Reactions  . Aggrenox [Aspirin-Dipyridamole Er] Other (See Comments)    Dizziness  . Carvedilol Other (See Comments)    Dizziness  . Claritin [Loratadine] Other (See Comments)    Dizziness & drowsiness   . Mucinex [Guaifenesin Er] Other (See Comments)    Dizziness, drowsiness  . Plavix [Clopidogrel Bisulfate] Other (See Comments)    Dizziness  . Rapaflo [Silodosin] Other (See Comments)    Dizziness for about 30- 45  minutes  . Statins Other (See Comments)    rhabdomyolisis  . Travatan Z [Travoprost] Other (See Comments)    Eye irritation    Social History:  The patient  reports that he has never smoked. He has never used smokeless tobacco. He reports that he does not drink alcohol or use illicit drugs.   ROS:  Please see the history of present illness.   Appetite is been stable. No large hemi-swelling.   All other systems reviewed and negative.   OBJECTIVE: VS:  BP 136/62  Pulse 72  Ht 5\' 7"  (1.702 m)  Wt 195 lb (88.451 kg)  BMI 30.53 kg/m2 Well nourished, well developed, in no acute distress, elderly but younger than stated age in appearance HEENT: normal Neck: JVD flat. Carotid bruit absent  Cardiac:  normal S1, S2; RRR; no murmur Lungs:  clear to auscultation bilaterally, no wheezing, rhonchi or rales Abd: soft, nontender, no hepatomegaly Ext: Edema absent. Pulses 2+ and symmetric Skin: warm and dry Neuro:  CNs 2-12 intact, no focal abnormalities noted  EKG:  Not repeated       Signed, Illene Labrador III, MD 10/06/2013 10:43 AM

## 2013-10-07 ENCOUNTER — Inpatient Hospital Stay: Payer: Medicare Other | Admitting: Pulmonary Disease

## 2013-10-09 ENCOUNTER — Encounter: Payer: Self-pay | Admitting: Pulmonary Disease

## 2013-10-09 ENCOUNTER — Ambulatory Visit (INDEPENDENT_AMBULATORY_CARE_PROVIDER_SITE_OTHER): Payer: Medicare Other | Admitting: Pulmonary Disease

## 2013-10-09 VITALS — BP 114/66 | HR 64 | Ht 67.0 in | Wt 198.0 lb

## 2013-10-09 DIAGNOSIS — R911 Solitary pulmonary nodule: Secondary | ICD-10-CM | POA: Insufficient documentation

## 2013-10-09 NOTE — Patient Instructions (Signed)
Your nodule has been stable over 8 years which means it is benign You can follow up with [place] as needed for respiratory problems, but right now we don't need to schedule another visit

## 2013-10-09 NOTE — Assessment & Plan Note (Signed)
Fortunately our radiology colleagues were able to track down images of the solid, round, solitary pulmonary nodule in the right lower lobe going back as far as 2007. The nodule has not changed during that time indicating that it is of benign etiology.  Plan: -No further followup needed

## 2013-10-09 NOTE — Progress Notes (Signed)
Subjective:    Patient ID: [name], male    DOB: 12-Dec-1918, 80 y.o.   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 He was referred to the [facility] pulmonary office in 2015 after he was found to have a pulmonary nodule during an early 2015 hospitalization for congestive heart failure and atrial fibrillation.   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 His shortness of breath has improved significantly. He has not been having significant leg swelling or chest pain. His weight has been stable and he has not been coughing. He has not been coughing up blood. He is here to see me today to discuss the results of a recent CT chest to followup the pulmonary nodule.   Past Medical History  Diagnosis Date  . Diabetes mellitus   . Hyperlipidemia   . Coronary artery disease   . Spinal stenosis   . Pneumonia   . Pacemaker   . Anemia   . Carotid artery disease   . PVD (peripheral vascular disease)   . Hyponatremia     Chronic  . CVA (cerebral infarction)   . CKD (chronic kidney disease) stage 4, GFR 15-29 ml/min   . Long term (current) use of anticoagulants     No bleeding on Eliquis  . Dysrhythmia   . PSVT (paroxysmal supraventricular tachycardia)   . PAF (paroxysmal atrial fibrillation)     Asymptomatic. On Eliquis.  . AV block, 3rd degree     With DDD St. Jude PM, initially placed in 1993 by Dr. Nils Pyle. Device upgrade 10/2002 to DDD, by Dr. Rollene Fare complicated by bleeding.  . Essential hypertension, benign   . CHF (congestive heart failure)   . Chronic diastolic congestive heart failure     ECHO 05/20/12 LVEF estimated by 2D at 55-60%  . Arthritis   . Shortness of breath         Review of Systems  Constitutional: Negative for fever and unexpected weight change.  HENT: Negative for congestion, dental problem, ear pain, nosebleeds, postnasal drip, rhinorrhea, sinus pressure, sneezing, sore  throat and trouble swallowing.   Eyes: Negative for redness and itching.  Respiratory: Negative for cough, chest tightness, shortness of breath and wheezing.   Cardiovascular: Negative for palpitations and leg swelling.  Gastrointestinal: Negative for nausea and vomiting.  Genitourinary: Negative for dysuria.  Musculoskeletal: Negative for joint swelling.  Skin: Negative for rash.  Neurological: Negative for headaches.  Hematological: Does not bruise/bleed easily.  Psychiatric/Behavioral: Negative for dysphoric mood. The patient is not nervous/anxious.        Objective:   Physical Exam Filed Vitals:   10/09/13 0940  BP: 114/66  Pulse: 64  Height: 5\' 7"  (1.702 m)  Weight: 198 lb (89.812 kg)  SpO2: 99%  RA  Gen: well appearing, no acute distress HEENT: NCAT, EOMi, OP clear, neck supple without masses PULM: CTA B CV: RRR, no mgr, no JVD AB: BS+, soft, nontender, no hsm Ext: warm, no edema, no clubbing, no cyanosis Derm: no rash or skin breakdown Neuro: A&Ox4, CN II-XII intact, strength 5/5 in all 4 extremities    April 2015 CT chest> right lower lobe nodule 1.1 cm has not changed since 2007     Assessment & Plan:   Solitary pulmonary nodule Fortunately our radiology colleagues were able to track down images of the solid, round, solitary pulmonary nodule in the right lower lobe going back as far  as 2007. The nodule has not changed during that time indicating that it is of benign etiology.  Plan: -No further followup needed   Updated Medication List Outpatient Encounter Prescriptions as of 10/09/2013  Medication Sig  . allopurinol (ZYLOPRIM) 100 MG tablet Take 300 mg by mouth daily. 100mg  qam, 200mg  qhs  . amiodarone (PACERONE) 200 MG tablet Take 200 mg by mouth daily.  [name] apixaban (ELIQUIS) 2.5 MG TABS tablet Take 2.5 mg by mouth 2 (two) times daily.  [name] dutasteride (AVODART) 0.5 MG capsule Take 0.5 mg by mouth daily.   . ferrous sulfate 325 (65 FE) MG tablet Take 325  mg by mouth every other day.   . Flaxseed, Linseed, (BL FLAX SEED OIL PO) Take 1 capsule by mouth 2 (two) times daily.  . hydrALAZINE (APRESOLINE) 25 MG tablet Take 25 mg by mouth every 12 (twelve) hours.   . insulin glargine (LANTUS) 100 UNIT/ML injection Inject 9-10 Units into the skin at bedtime.   . insulin lispro (HUMALOG) 100 UNIT/ML injection Inject 2-9 Units into the skin 3 (three) times daily before meals. 9 units with breakfast, 2-3 units at lunch & 9 units after supper  . isosorbide dinitrate (ISORDIL) 20 MG tablet Take 1 tablet (20 mg total) by mouth 2 (two) times daily.  . metoprolol tartrate (LOPRESSOR) 25 MG tablet Take 1 tablet (25 mg total) by mouth daily.  . Multiple Vitamin (MULTIVITAMIN WITH MINERALS) TABS Take 1 tablet by mouth daily. Diabetic pak by Petra Kuba from [employer]  . Polyethyl Glycol-Propyl Glycol (SYSTANE) 0.4-0.3 % SOLN Place 1 drop into both eyes daily as needed (dry eyes). Into affected eyes  . polyethylene glycol (MIRALAX / GLYCOLAX) packet Take 17 g by mouth 2 (two) times daily as needed for mild constipation. For constipation  . Polyvinyl Alcohol-Povidone (REFRESH OP) Place 1 drop into both eyes 2 (two) times daily. Refresh Gel Opth  . potassium chloride SA (K-DUR,KLOR-CON) 20 MEQ tablet take 1 tablet by mouth once daily  . RAPAFLO 8 MG CAPS capsule Take 1 capsule by mouth daily.  . Tafluprost (ZIOPTAN) 0.0015 % SOLN Place 1 drop into both eyes at bedtime. Use at night  . torsemide (DEMADEX) 20 MG tablet Take 60 mg by mouth 2 (two) times daily.  . [DISCONTINUED] torsemide (DEMADEX) 20 MG tablet Take 4 tablets (80 mg total) by mouth 2 (two) times daily. Take 80mg  in the morning total of 4 pills.

## 2013-10-29 ENCOUNTER — Encounter: Payer: Self-pay | Admitting: Physician Assistant

## 2013-11-03 ENCOUNTER — Ambulatory Visit (INDEPENDENT_AMBULATORY_CARE_PROVIDER_SITE_OTHER): Payer: Medicare Other | Admitting: Interventional Cardiology

## 2013-11-03 ENCOUNTER — Encounter: Payer: Self-pay | Admitting: Interventional Cardiology

## 2013-11-03 ENCOUNTER — Ambulatory Visit: Payer: Medicare Other | Admitting: Interventional Cardiology

## 2013-11-03 VITALS — BP 110/65 | HR 66 | Ht 67.0 in | Wt 190.0 lb

## 2013-11-03 DIAGNOSIS — I509 Heart failure, unspecified: Secondary | ICD-10-CM

## 2013-11-03 DIAGNOSIS — N184 Chronic kidney disease, stage 4 (severe): Secondary | ICD-10-CM

## 2013-11-03 DIAGNOSIS — I480 Paroxysmal atrial fibrillation: Secondary | ICD-10-CM

## 2013-11-03 DIAGNOSIS — I5032 Chronic diastolic (congestive) heart failure: Secondary | ICD-10-CM

## 2013-11-03 DIAGNOSIS — Z7901 Long term (current) use of anticoagulants: Secondary | ICD-10-CM

## 2013-11-03 DIAGNOSIS — I4891 Unspecified atrial fibrillation: Secondary | ICD-10-CM

## 2013-11-03 DIAGNOSIS — Z950 Presence of cardiac pacemaker: Secondary | ICD-10-CM

## 2013-11-03 NOTE — Progress Notes (Signed)
Patient ID: [name], male   DOB: 1918/09/11, 80 y.o.   MRN: [id number]    [address] [place], [place]  [address] Phone: [phone] Fax:  [phone]  Date:  11/03/2013   ID:  [name], DOB 02/22/1919, MRN [id number]  PCP:  [name], MD   ASSESSMENT:  1. Chronic diastolic heart failure 2. Paroxysmal atrial fib 3. Chronic kidney disease 4. Hypertension, controlled 5. Dizziness  PLAN:  1. No change in therapy 2. Clinical followup in one month   SUBJECTIVE: [name] is a 80 y.o. male who is having some fatigue and weakness. Occasional dizziness. No dyspnea chest pain   Wt Readings from Last 3 Encounters:  11/03/13 190 lb (86.183 kg)  10/09/13 198 lb (89.812 kg)  10/06/13 195 lb (88.451 kg)     Past Medical History  Diagnosis Date  . Diabetes mellitus   . Hyperlipidemia   . Coronary artery disease   . Spinal stenosis   . Pneumonia   . Pacemaker   . Anemia   . Carotid artery disease   . PVD (peripheral vascular disease)   . Hyponatremia     Chronic  . CVA (cerebral infarction)   . CKD (chronic kidney disease) stage 4, GFR 15-29 ml/min   . Long term (current) use of anticoagulants     No bleeding on Eliquis  . Dysrhythmia   . PSVT (paroxysmal supraventricular tachycardia)   . PAF (paroxysmal atrial fibrillation)     Asymptomatic. On Eliquis.  . AV block, 3rd degree     With DDD St. Jude PM, initially placed in 1993 by Dr. Nils Pyle. Device upgrade 10/2002 to DDD, by Dr. Rollene Fare complicated by bleeding.  . Essential hypertension, benign   . CHF (congestive heart failure)   . Chronic diastolic congestive heart failure     ECHO 05/20/12 LVEF estimated by 2D at 55-60%  . Arthritis   . Shortness of breath     Current Outpatient Prescriptions  Medication Sig Dispense Refill  . allopurinol (ZYLOPRIM) 100 MG tablet Take 300 mg by mouth daily. 100mg  qam, 200mg  qhs      . amiodarone (PACERONE) 200 MG tablet Take 200 mg by  mouth daily.      [name] apixaban (ELIQUIS) 2.5 MG TABS tablet Take 2.5 mg by mouth 2 (two) times daily.      [name] dutasteride (AVODART) 0.5 MG capsule Take 0.5 mg by mouth daily.       . ferrous sulfate 325 (65 FE) MG tablet Take 325 mg by mouth every other day.       . Flaxseed, Linseed, (BL FLAX SEED OIL PO) Take 1 capsule by mouth 2 (two) times daily.      . hydrALAZINE (APRESOLINE) 25 MG tablet Take 25 mg by mouth every 12 (twelve) hours.       . insulin glargine (LANTUS) 100 UNIT/ML injection Inject 9-10 Units into the skin at bedtime.       . insulin lispro (HUMALOG) 100 UNIT/ML injection Inject 2-9 Units into the skin 3 (three) times daily before meals. 9 units with breakfast, 2-3 units at lunch & 9 units after supper      . isosorbide dinitrate (ISORDIL) 20 MG tablet Take 1 tablet (20 mg total) by mouth 2 (two) times daily.  60 tablet  11  . metoprolol tartrate (LOPRESSOR) 25 MG tablet Take 1 tablet (25 mg total) by mouth daily.  30 tablet  2  .  Multiple Vitamin (MULTIVITAMIN WITH MINERALS) TABS Take 1 tablet by mouth daily. Diabetic pak by Petra Kuba from [employer]      . Polyethyl Glycol-Propyl Glycol (SYSTANE) 0.4-0.3 % SOLN Place 1 drop into both eyes daily as needed (dry eyes). Into affected eyes      . polyethylene glycol (MIRALAX / GLYCOLAX) packet Take 17 g by mouth 2 (two) times daily as needed for mild constipation. For constipation      . Polyvinyl Alcohol-Povidone (REFRESH OP) Place 1 drop into both eyes 2 (two) times daily. Refresh Gel Opth      . potassium chloride SA (K-DUR,KLOR-CON) 20 MEQ tablet take 1 tablet by mouth once daily  30 tablet  1  . RAPAFLO 8 MG CAPS capsule Take 1 capsule by mouth daily.      . Tafluprost (ZIOPTAN) 0.0015 % SOLN Place 1 drop into both eyes at bedtime. Use at night      . torsemide (DEMADEX) 20 MG tablet Take 60 mg by mouth 2 (two) times daily.       No current facility-administered medications for this visit.    Allergies:    Allergies  Allergen  Reactions  . Aggrenox [Aspirin-Dipyridamole Er] Other (See Comments)    Dizziness  . Carvedilol Other (See Comments)    Dizziness  . Claritin [Loratadine] Other (See Comments)    Dizziness & drowsiness   . Mucinex [Guaifenesin Er] Other (See Comments)    Dizziness, drowsiness  . Plavix [Clopidogrel Bisulfate] Other (See Comments)    Dizziness  . Rapaflo [Silodosin] Other (See Comments)    Dizziness for about 30- 45  minutes  . Statins Other (See Comments)    rhabdomyolisis  . Travatan Z [Travoprost] Other (See Comments)    Eye irritation    Social History:  The patient  reports that he quit smoking about 55 years ago. His smoking use included Pipe. He has never used smokeless tobacco. He reports that he does not drink alcohol or use illicit drugs.   ROS:  Please see the history of present illness. Occasional chest tightness. Occasional dizziness. When dizziness occurs it lasts up to 20 minutes. Does not happen every day.    All other systems reviewed and negative.   OBJECTIVE: VS:  BP 110/65  Pulse 66  Ht 5\' 7"  (1.702 m)  Wt 190 lb (86.183 kg)  BMI 29.75 kg/m2  SpO2 96% Well nourished, well developed, in no acute distress, obese HEENT: normal Neck: JVD flat. Carotid bruit absent  Cardiac:  normal S1, S2; RRR; no murmur Lungs:  clear to auscultation bilaterally, no wheezing, rhonchi or rales Abd: soft, nontender, no hepatomegaly Ext: Edema absent. Pulses 2+ and symmetric Skin: warm and dry Neuro:  CNs 2-12 intact, no focal abnormalities noted  EKG:  Not repeated       Signed, Illene Labrador III, MD 11/03/2013 8:59 AM

## 2013-11-03 NOTE — Patient Instructions (Signed)
Your physician recommends that you continue on your current medications as directed. Please refer to the Current Medication list given to you today.  You have a follow up appt scheduled for 12/03/13 @ 9am

## 2013-11-04 ENCOUNTER — Encounter: Payer: Self-pay | Admitting: *Deleted

## 2013-11-10 ENCOUNTER — Telehealth: Payer: Self-pay

## 2013-11-10 NOTE — Telephone Encounter (Signed)
pt caretaker [name]  called the office because a letter was received that pt needs to schedule an appt to have his pace maker checked. appt made with the device clinic for 7/16 @9am .[name] aware.

## 2013-11-12 ENCOUNTER — Other Ambulatory Visit: Payer: Self-pay

## 2013-11-12 MED ORDER — METOPROLOL TARTRATE 25 MG PO TABS: 25.0000 mg | ORAL_TABLET | Freq: Every day | ORAL | Status: DC

## 2013-11-20 ENCOUNTER — Emergency Department (HOSPITAL_COMMUNITY)
Admission: EM | Admit: 2013-11-20 | Discharge: 2013-11-20 | Disposition: A | Discharge status: Home / Self Care | Payer: Medicare Other | Attending: Emergency Medicine | Admitting: Emergency Medicine

## 2013-11-20 ENCOUNTER — Encounter (HOSPITAL_COMMUNITY): Payer: Self-pay | Admitting: Emergency Medicine

## 2013-11-20 ENCOUNTER — Emergency Department (HOSPITAL_COMMUNITY): Payer: Medicare Other

## 2013-11-20 DIAGNOSIS — Z950 Presence of cardiac pacemaker: Secondary | ICD-10-CM | POA: Insufficient documentation

## 2013-11-20 DIAGNOSIS — E785 Hyperlipidemia, unspecified: Secondary | ICD-10-CM | POA: Insufficient documentation

## 2013-11-20 DIAGNOSIS — Z9889 Other specified postprocedural states: Secondary | ICD-10-CM | POA: Insufficient documentation

## 2013-11-20 DIAGNOSIS — Z7902 Long term (current) use of antithrombotics/antiplatelets: Secondary | ICD-10-CM | POA: Insufficient documentation

## 2013-11-20 DIAGNOSIS — N189 Chronic kidney disease, unspecified: Secondary | ICD-10-CM

## 2013-11-20 DIAGNOSIS — Z794 Long term (current) use of insulin: Secondary | ICD-10-CM | POA: Insufficient documentation

## 2013-11-20 DIAGNOSIS — W010XXA Fall on same level from slipping, tripping and stumbling without subsequent striking against object, initial encounter: Secondary | ICD-10-CM | POA: Insufficient documentation

## 2013-11-20 DIAGNOSIS — W19XXXA Unspecified fall, initial encounter: Secondary | ICD-10-CM

## 2013-11-20 DIAGNOSIS — Z8739 Personal history of other diseases of the musculoskeletal system and connective tissue: Secondary | ICD-10-CM | POA: Insufficient documentation

## 2013-11-20 DIAGNOSIS — Z79899 Other long term (current) drug therapy: Secondary | ICD-10-CM | POA: Insufficient documentation

## 2013-11-20 DIAGNOSIS — Z8701 Personal history of pneumonia (recurrent): Secondary | ICD-10-CM | POA: Insufficient documentation

## 2013-11-20 DIAGNOSIS — D649 Anemia, unspecified: Secondary | ICD-10-CM | POA: Insufficient documentation

## 2013-11-20 DIAGNOSIS — I4891 Unspecified atrial fibrillation: Secondary | ICD-10-CM | POA: Insufficient documentation

## 2013-11-20 DIAGNOSIS — I2510 Atherosclerotic heart disease of native coronary artery without angina pectoris: Secondary | ICD-10-CM | POA: Insufficient documentation

## 2013-11-20 DIAGNOSIS — I471 Supraventricular tachycardia, unspecified: Secondary | ICD-10-CM | POA: Insufficient documentation

## 2013-11-20 DIAGNOSIS — I129 Hypertensive chronic kidney disease with stage 1 through stage 4 chronic kidney disease, or unspecified chronic kidney disease: Secondary | ICD-10-CM | POA: Insufficient documentation

## 2013-11-20 DIAGNOSIS — Z87891 Personal history of nicotine dependence: Secondary | ICD-10-CM | POA: Insufficient documentation

## 2013-11-20 DIAGNOSIS — R0602 Shortness of breath: Secondary | ICD-10-CM | POA: Insufficient documentation

## 2013-11-20 DIAGNOSIS — N184 Chronic kidney disease, stage 4 (severe): Secondary | ICD-10-CM | POA: Insufficient documentation

## 2013-11-20 DIAGNOSIS — Z043 Encounter for examination and observation following other accident: Secondary | ICD-10-CM | POA: Insufficient documentation

## 2013-11-20 DIAGNOSIS — Z8673 Personal history of transient ischemic attack (TIA), and cerebral infarction without residual deficits: Secondary | ICD-10-CM | POA: Insufficient documentation

## 2013-11-20 DIAGNOSIS — Y9389 Activity, other specified: Secondary | ICD-10-CM | POA: Insufficient documentation

## 2013-11-20 DIAGNOSIS — I5032 Chronic diastolic (congestive) heart failure: Secondary | ICD-10-CM | POA: Insufficient documentation

## 2013-11-20 DIAGNOSIS — Y92009 Unspecified place in unspecified non-institutional (private) residence as the place of occurrence of the external cause: Secondary | ICD-10-CM | POA: Insufficient documentation

## 2013-11-20 DIAGNOSIS — E119 Type 2 diabetes mellitus without complications: Secondary | ICD-10-CM | POA: Insufficient documentation

## 2013-11-20 LAB — I-STAT TROPONIN, ED: TROPONIN I, POC: 0.1 ng/mL — AB (ref 0.00–0.08)

## 2013-11-20 LAB — CBC
HEMATOCRIT: 35.6 % — AB (ref 39.0–52.0)
HEMOGLOBIN: 11.6 g/dL — AB (ref 13.0–17.0)
MCH: 32.4 pg (ref 26.0–34.0)
MCHC: 32.6 g/dL (ref 30.0–36.0)
MCV: 99.4 fL (ref 78.0–100.0)
Platelets: 181 10*3/uL (ref 150–400)
RBC: 3.58 MIL/uL — AB (ref 4.22–5.81)
RDW: 15.3 % (ref 11.5–15.5)
WBC: 5.7 10*3/uL (ref 4.0–10.5)

## 2013-11-20 LAB — BASIC METABOLIC PANEL
Anion gap: 13 (ref 5–15)
BUN: 33 mg/dL — AB (ref 6–23)
CHLORIDE: 97 meq/L (ref 96–112)
CO2: 32 meq/L (ref 19–32)
Calcium: 9.3 mg/dL (ref 8.4–10.5)
Creatinine, Ser: 2.48 mg/dL — ABNORMAL HIGH (ref 0.50–1.35)
GFR calc Af Amer: 24 mL/min — ABNORMAL LOW (ref >90)
GFR calc non Af Amer: 21 mL/min — ABNORMAL LOW (ref >90)
GLUCOSE: 118 mg/dL — AB (ref 70–99)
POTASSIUM: 3.9 meq/L (ref 3.7–5.3)
SODIUM: 142 meq/L (ref 137–147)

## 2013-11-20 LAB — PRO B NATRIURETIC PEPTIDE: PRO B NATRI PEPTIDE: 2042 pg/mL — AB (ref 0–450)

## 2013-11-20 NOTE — ED Notes (Signed)
MD Brackbill at bedside, reports that pt can go home. Dr Canary Brim made aware.

## 2013-11-20 NOTE — Consult Note (Signed)
CARDIOLOGY CONSULT NOTE   Patient ID: [name] MRN: [id number], DOB/AGE: 1919/02/01   Admit date: 11/20/2013 Date of Consult: 11/20/2013   Primary Physician: [name], MD Primary Cardiologist: 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  Problem List  Past Medical History  Diagnosis Date  . Diabetes mellitus   . Hyperlipidemia   . Coronary artery disease   . Spinal stenosis   . Pneumonia   . Pacemaker   . Anemia   . Carotid artery disease   . PVD (peripheral vascular disease)   . Hyponatremia     Chronic  . CVA (cerebral infarction)   . CKD (chronic kidney disease) stage 4, GFR 15-29 ml/min   . Long term (current) use of anticoagulants     No bleeding on Eliquis  . Dysrhythmia   . PSVT (paroxysmal supraventricular tachycardia)   . PAF (paroxysmal atrial fibrillation)     Asymptomatic. On Eliquis.  . AV block, 3rd degree     With DDD St. Jude PM, initially placed in 1993 by Dr. Nils Pyle. Device upgrade 10/2002 to DDD, by Dr. Rollene Fare complicated by bleeding.  . Essential hypertension, benign   . CHF (congestive heart failure)   . Chronic diastolic congestive heart failure     ECHO 05/20/12 LVEF estimated by 2D at 55-60%  . Arthritis   . Shortness of breath     Past Surgical History  Procedure Laterality Date  . Lumbar laminectomy  1995  . Back surgery    . Total knee arthroplasty Left   . Quadriceps tendon repair    . Cataract extraction    . Carotid endarterectomy    . Yag laser application Right 5/64/3329    Procedure: YAG LASER CAPSULOTOMY OF RIGHT EYE;  Surgeon: [name], MD;  Location: [facility];  Service: Ophthalmology;  Laterality: Right;  . Tonsillectomy    . Insert / replace / remove pacemaker      DDD St. Jude PM, initially placed in 1993 by Dr. Nils Pyle. Device upgrade 10/2002 to DDD, by Dr. Rollene Fare complicated by bleeding.  . Carotid endarterectomy Right  2006  . Transurethral resection of prostate    . Cardioversion N/A 06/16/2013    Procedure: CARDIOVERSION BEDSIDE;  Surgeon: [name], MD;  Location: [facility] OR;  Service: Cardiovascular;  Laterality: N/A;     Allergies  Allergies  Allergen Reactions  . Aggrenox [Aspirin-Dipyridamole Er] Other (See Comments)    Dizziness  . Carvedilol Other (See Comments)    Dizziness  . Claritin [Loratadine] Other (See Comments)    Dizziness & drowsiness   . Mucinex [Guaifenesin Er] Other (See Comments)    Dizziness, drowsiness  . Plavix [Clopidogrel Bisulfate] Other (See Comments)    Dizziness  . Rapaflo [Silodosin] Other (See Comments)    Dizziness for about 30- 45  minutes  . Statins Other (See Comments)    rhabdomyolisis  . 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  He has a history of coronary artery disease, permanent pacemaker, paroxysmal atrial fibrillation, chronic kidney disease, and chronic diastolic heart failure.  Recently he has been feeling well on his current medical therapy.  [name] on 11/03/13 and was doing well at that time.  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  He worked yesterday.  He gets around with a walker.  He has not had any recent increase in peripheral edema or increase in dyspnea.  He denies any recent chest pain.  This morning he got up quickly to go to the bathroom.  He missed his walker which was alongside his bed and he fell to the floor.  He did not injure himself.  There was no dizziness or loss of consciousness.  He states he just simply missed his walker as he grabbed for it and he lost his balance.  In the emergency room he has been stable.  Chest x-ray is stable.  His pro BNP is chronically elevated and is stable.  His point-of-care troponin is borderline elevated as it chronically is.  He feels well now. Inpatient  Medications    Family History Family History  Problem Relation Age of Onset  . Multiple myeloma Father   . Hypertension Mother      Social History History   Social History  . Marital Status: Widowed    Spouse Name: N/A    Number of Children: 7  . Years of Education: N/A   Occupational History  . MD    Social History Main Topics  . Smoking status: Former Smoker -- 2 years    Types: Pipe    Quit date: 05/14/1958  . Smokeless tobacco: Never Used  . Alcohol Use: No     Comment: Cocktails occasionally  . Drug Use: No  . Sexual Activity: Not Currently   Other Topics Concern  . Not on file   Social History Narrative   Has a caregiver that lives with him. Still works regularly.     Review of Systems  General:  No chills, fever, night sweats or weight changes.  Cardiovascular:  No chest pain, dyspnea on exertion, edema, orthopnea, palpitations, paroxysmal nocturnal dyspnea. Dermatological: No rash, lesions/masses Respiratory: No cough, dyspnea Urologic: No hematuria, dysuria Abdominal:   No nausea, vomiting, diarrhea, bright red blood per rectum, melena, or hematemesis Neurologic:  No visual changes, wkns, changes in mental status. All other systems reviewed and are otherwise negative except as noted above.  Physical Exam  Blood pressure 146/61, pulse 64, temperature 98.4 F (36.9 C), temperature source Oral, resp. rate 16, height 5' 7.5" (1.715 m), weight 190 lb 1 oz (86.212 kg), SpO2 100.00%.  General: Pleasant, NAD Psych: Normal affect. Neuro: Alert and oriented X 3. Moves all extremities spontaneously. HEENT: Normal  Neck: Supple without bruits or JVD. Lungs:  Resp regular and unlabored, CTA. Heart: RRR no s3, s4, or murmurs. Abdomen: Soft, non-tender, non-distended, BS + x 4.  Extremities: No clubbing, cyanosis or edema. DP/PT/Radials 2+ and equal bilaterally.  Labs  No results found for this basename: CKTOTAL, CKMB, TROPONINI,  in the last 72 hours Lab  Results  Component Value Date   WBC 5.7 11/20/2013   HGB 11.6* 11/20/2013   HCT 35.6* 11/20/2013   MCV 99.4 11/20/2013   PLT 181 11/20/2013     Recent Labs Lab 11/20/13 0829  NA 142  K 3.9  CL 97  CO2 32  BUN 33*  CREATININE 2.48*  CALCIUM 9.3  GLUCOSE 118*   No results found for this basename: CHOL,  HDL,  LDLCALC,  TRIG   Lab Results  Component Value Date   DDIMER 0.72* 08/26/2012    Radiology/Studies  Dg Chest Portable 1 View  11/20/2013   CLINICAL DATA:  Shortness of breath.  ,  on CPAP  EXAM: PORTABLE CHEST - 1 VIEW  COMPARISON:  Portable chest x-ray of Sep 13, 2013  FINDINGS: The lungs are adequately inflated. There remain coarse interstitial markings in the mid and lower lungs bilaterally. There is no alveolar infiltrate nor pleural effusion. The cardiac silhouette is mildly enlarged. The pulmonary vascularity is not engorged. The permanent pacemaker is unchanged. There are degenerative changes of the shoulders.  IMPRESSION: There is persistent increased interstitial density in the mid and lower lungs. This may reflect scarring, interstitial pneumonia, or mild interstitial edema of cardiac or noncardiac cause.   Electronically Signed   By: David  Martinique   On: 11/20/2013 08:58    ECG  Atrial paced rhythm.  No acute ischemic changes.  ASSESSMENT AND PLAN 1. Chronic diastolic heart failure, stable.  No evidence of increased fluid retention.  He states that his weight is stable at home.  2. Paroxysmal atrial fib currently maintaining normal sinus rhythm on amiodarone 3. Chronic kidney disease stable stage IV 4. Hypertension, controlled on current therapy 5. mechanical fall secondary to losing his balance as he reached for his walker.  No evidence of injury.   Recommendation: From the cardiac standpoint the patient is stable to be discharged home to the care of his family.  He should continue his current cardiac therapy.  He has an appointment next week for a pacemaker check  and he has an appointment in about a month to see [name].  Signed, Darlin Coco, MD  11/20/2013, 11:05 AM

## 2013-11-20 NOTE — ED Provider Notes (Addendum)
CSN: [id number]     Arrival date & time 11/20/13  5170 History   First MD Initiated Contact with Patient 11/20/13 0827     Chief Complaint  Patient presents with  . Fall  . Shortness of Breath     (Consider location/radiation/quality/duration/timing/severity/associated sxs/prior Treatment) HPI Pt presents with c/o difficulty breathing and fall at home this morning.  Pt called EMS after fall, he states his knees gave out on him and he fell to the ground.  He had no pain, but did have some difficulty getting up.  While he was trying to stand back up he began to feel very short of breath.  Per EMS O2 sat was in the 80's and he had decreased lung sounds- they placed him on cpap.  Daughter states she gave him 4 of his fluid pills this morning.  He states he has not had a change in his weight which he checks every day.  No chest pain.  No recent fever/chills, no cough.  No leg swelling.  NO syncope.  There are no other associated systemic symptoms, there are no other alleviating or modifying factors.   Past Medical History  Diagnosis Date  . Diabetes mellitus   . Hyperlipidemia   . Coronary artery disease   . Spinal stenosis   . Pneumonia   . Pacemaker   . Anemia   . Carotid artery disease   . PVD (peripheral vascular disease)   . Hyponatremia     Chronic  . CVA (cerebral infarction)   . CKD (chronic kidney disease) stage 4, GFR 15-29 ml/min   . Long term (current) use of anticoagulants     No bleeding on Eliquis  . Dysrhythmia   . PSVT (paroxysmal supraventricular tachycardia)   . PAF (paroxysmal atrial fibrillation)     Asymptomatic. On Eliquis.  . AV block, 3rd degree     With DDD St. Jude PM, initially placed in 1993 by Dr. Nils Pyle. Device upgrade 10/2002 to DDD, by Dr. Rollene Fare complicated by bleeding.  . Essential hypertension, benign   . CHF (congestive heart failure)   . Chronic diastolic congestive heart failure     ECHO 05/20/12 LVEF estimated by 2D at 55-60%  . Arthritis    . Shortness of breath    Past Surgical History  Procedure Laterality Date  . Lumbar laminectomy  1995  . Back surgery    . Total knee arthroplasty Left   . Quadriceps tendon repair    . Cataract extraction    . Carotid endarterectomy    . Yag laser application Right 0/17/4944    Procedure: YAG LASER CAPSULOTOMY OF RIGHT EYE;  Surgeon: [name], MD;  Location: [facility];  Service: Ophthalmology;  Laterality: Right;  . Tonsillectomy    . Insert / replace / remove pacemaker      DDD St. Jude PM, initially placed in 1993 by Dr. Nils Pyle. Device upgrade 10/2002 to DDD, by Dr. Rollene Fare complicated by bleeding.  . Carotid endarterectomy Right 2006  . Transurethral resection of prostate    . Cardioversion N/A 06/16/2013    Procedure: CARDIOVERSION BEDSIDE;  Surgeon: [name], MD;  Location: [facility] OR;  Service: Cardiovascular;  Laterality: N/A;   Family History  Problem Relation Age of Onset  . Multiple myeloma Father   . Hypertension Mother    History  Substance Use Topics  . Smoking status: Former Smoker -- 2 years    Types: Pipe    Quit date:  05/14/1958  . Smokeless tobacco: Never Used  . Alcohol Use: No     Comment: Cocktails occasionally    Review of Systems ROS reviewed and all otherwise negative except for mentioned in HPI    Allergies  Aggrenox; Carvedilol; Claritin; Mucinex; Plavix; Rapaflo; Statins; and Travatan z  Home Medications   Prior to Admission medications   Medication Sig Start Date End Date Taking? Authorizing Provider  allopurinol (ZYLOPRIM) 100 MG tablet Take 200 mg by mouth daily.    Yes Historical Provider, MD  amiodarone (PACERONE) 200 MG tablet Take 200 mg by mouth daily. 06/22/13  Yes Belva Crome III, MD  apixaban (ELIQUIS) 2.5 MG TABS tablet Take 2.5 mg by mouth 2 (two) times daily.   Yes Historical Provider, MD  dutasteride (AVODART) 0.5 MG capsule Take 0.5 mg by mouth daily.    Yes Historical Provider, MD  ferrous sulfate 325 (65  FE) MG tablet Take 325 mg by mouth every other day.    Yes Historical Provider, MD  fexofenadine (ALLEGRA) 180 MG tablet Take 180 mg by mouth daily.   Yes Historical Provider, MD  Flaxseed, Linseed, (BL FLAX SEED OIL PO) Take 1 capsule by mouth 2 (two) times daily.   Yes Historical Provider, MD  hydrALAZINE (APRESOLINE) 25 MG tablet Take 25 mg by mouth every 12 (twelve) hours.    Yes Historical Provider, MD  insulin aspart (NOVOLOG FLEXPEN) 100 UNIT/ML FlexPen Inject 2-9 Units into the skin 3 (three) times daily with meals.   Yes Historical Provider, MD  insulin glargine (LANTUS) 100 UNIT/ML injection Inject 9-10 Units into the skin at bedtime.    Yes Historical Provider, MD  isosorbide dinitrate (ISORDIL) 20 MG tablet Take 1 tablet (20 mg total) by mouth 2 (two) times daily. 08/14/13  Yes Belva Crome III, MD  metoprolol tartrate (LOPRESSOR) 25 MG tablet Take 1 tablet (25 mg total) by mouth daily. 11/12/13  Yes [name], MD  Multiple Vitamin (MULTIVITAMIN WITH MINERALS) TABS Take 1 tablet by mouth daily. Diabetic pak by Petra Kuba from [employer]   Yes Historical Provider, MD  Polyethyl Glycol-Propyl Glycol (SYSTANE) 0.4-0.3 % SOLN Place 1 drop into both eyes daily as needed (dry eyes). Into affected eyes   Yes Historical Provider, MD  polyethylene glycol (MIRALAX / GLYCOLAX) packet Take 17 g by mouth 2 (two) times daily as needed for mild constipation. For constipation   Yes Historical Provider, MD  Polyvinyl Alcohol-Povidone (REFRESH OP) Place 1 drop into both eyes 2 (two) times daily. Refresh Gel Opth   Yes Historical Provider, MD  potassium chloride SA (K-DUR,KLOR-CON) 20 MEQ tablet take 1 tablet by mouth once daily   Yes Belva Crome III, MD  silodosin (RAPAFLO) 8 MG CAPS capsule Take 8 mg by mouth daily with breakfast.   Yes Historical Provider, MD  Tafluprost (ZIOPTAN) 0.0015 % SOLN Place 1 drop into both eyes at bedtime. Use at night   Yes Historical Provider, MD  torsemide (DEMADEX) 20 MG  tablet Take 60-80 mg by mouth 2 (two) times daily. Take 4 tablets in the morning and 3 tablets at night 09/15/13  Yes Rhonda G Barrett, PA-C   BP 160/75  Pulse 60  Temp(Src) 98.4 F (36.9 C) (Oral)  Resp 13  Ht 5' 7.5" (1.715 m)  Wt 190 lb 1 oz (86.212 kg)  BMI 29.31 kg/m2  SpO2 100% Vitals reviewed Physical Exam Physical Examination: General appearance - alert, well appearing, and in no distress Mental status -  alert, oriented to person, place, and time Eyes - no conjunctival injection, no scleral icterus Mouth - mucous membranes moist, pharynx normal without lesions, CPAP in place Chest - clear to auscultation, no wheezes, rales or rhonchi, symmetric air entry Heart - normal rate, regular rhythm, normal S1, S2, no murmurs, rubs, clicks or gallops Abdomen - soft, nontender, nondistended, no masses or organomegaly Extremities - peripheral pulses normal, no pedal edema, no clubbing or cyanosis Skin - normal coloration and turgor, no rashes  ED Course  Procedures (including critical care time)  10:31 AM d/w cardiology and they will see patient in the ED. Pt is requesting to be discharged, will see what disposition cardiology advises.  Labs Review Labs Reviewed  CBC - Abnormal; Notable for the following:    RBC 3.58 (*)    Hemoglobin 11.6 (*)    HCT 35.6 (*)    All other components within normal limits  BASIC METABOLIC PANEL - Abnormal; Notable for the following:    Glucose, Bld 118 (*)    BUN 33 (*)    Creatinine, Ser 2.48 (*)    GFR calc non Af Amer 21 (*)    GFR calc Af Amer 24 (*)    All other components within normal limits  PRO B NATRIURETIC PEPTIDE - Abnormal; Notable for the following:    Pro B Natriuretic peptide (BNP) 2042.0 (*)    All other components within normal limits  I-STAT TROPOININ, ED - Abnormal; Notable for the following:    Troponin i, poc 0.10 (*)    All other components within normal limits    Imaging Review Dg Chest Portable 1 View  11/20/2013    CLINICAL DATA:  Shortness of breath.  , on CPAP  EXAM: PORTABLE CHEST - 1 VIEW  COMPARISON:  Portable chest x-ray of Sep 13, 2013  FINDINGS: The lungs are adequately inflated. There remain coarse interstitial markings in the mid and lower lungs bilaterally. There is no alveolar infiltrate nor pleural effusion. The cardiac silhouette is mildly enlarged. The pulmonary vascularity is not engorged. The permanent pacemaker is unchanged. There are degenerative changes of the shoulders.  IMPRESSION: There is persistent increased interstitial density in the mid and lower lungs. This may reflect scarring, interstitial pneumonia, or mild interstitial edema of cardiac or noncardiac cause.   Electronically Signed   By: David  Martinique   On: 11/20/2013 08:58     EKG Interpretation   Date/Time:  Friday November 20 2013 08:22:33 EDT Ventricular Rate:  64 PR Interval:  286 QRS Duration: 104 QT Interval:  410 QTC Calculation: 422 R Axis:   45 Text Interpretation:  Atrial-paced rhythm with prolonged AV conduction ST  \T\ T wave abnormality, consider inferolateral ischemia Abnormal ECG No  significant change since last tracing Confirmed by [facility]  MD, MARTHA  [phone]) on 11/20/2013 1:55:45 PM      MDM   Final diagnoses:  Shortness of breath  Fall, initial encounter    Pt presenting after fall at home.  Became short of breath while trying to get up- EMS placed on CPAP.  Upon arrival to the ED he was weaned quickly to [place] O2, as he continued to feel improved he was weaned to RA.  CXR reassuring, labs reassuring.  Pt denies any bony point tenderness after fall.  Cardiology has seen patient and recommend dispo to home.  Pt will f/u with [name].  Pt was offered admission but much prefers to go home.  Discharged with strict return  precautions.  Pt agreeable with plan.  Prior records reviewed and considered during this visit  Xray images reviewed and interpreted by me as well.  Nursing notes including past medical  history and social history reviewed and considered in documentation     Threasa Beards, MD 11/20/13 [place], MD 11/20/13 [phone]

## 2013-11-20 NOTE — ED Notes (Signed)
Received pt via EMS with c/o pt fell getting out of bed. When pt attempted to get up he became short of breath. Per EMS pt with rales for lung sounds. Pt initial BP 200/100 then 158/92 after CPAP. Daughter told EMS that pt took 4 of his fluid pills.

## 2013-11-20 NOTE — Discharge Instructions (Signed)
Return to the ED with any concerns including difficulty breathing, chest pain, fainting, decreased level of alertness/lethargy, or any other alarming symptoms °

## 2013-11-20 NOTE — Progress Notes (Signed)
Pt arrived on CPAP, placed on BIPAP with MD at bedside. RT will continue to monitor

## 2013-11-26 ENCOUNTER — Ambulatory Visit (INDEPENDENT_AMBULATORY_CARE_PROVIDER_SITE_OTHER): Payer: Medicare Other | Admitting: *Deleted

## 2013-11-26 ENCOUNTER — Encounter: Payer: Self-pay | Admitting: Internal Medicine

## 2013-11-26 DIAGNOSIS — I480 Paroxysmal atrial fibrillation: Secondary | ICD-10-CM

## 2013-11-26 DIAGNOSIS — I4891 Unspecified atrial fibrillation: Secondary | ICD-10-CM

## 2013-11-26 LAB — MDC_IDC_ENUM_SESS_TYPE_INCLINIC
Battery Voltage: 2.93 V
Brady Statistic RV Percent Paced: 5.5 %
Implantable Pulse Generator Model: 5386
Lead Channel Impedance Value: 237.5 Ohm
Lead Channel Impedance Value: 512.5 Ohm
Lead Channel Pacing Threshold Amplitude: 1.25 V
Lead Channel Pacing Threshold Pulse Width: 0.6 ms
Lead Channel Pacing Threshold Pulse Width: 0.8 ms
Lead Channel Sensing Intrinsic Amplitude: 1.6 mV
Lead Channel Sensing Intrinsic Amplitude: 12 mV
Lead Channel Setting Pacing Amplitude: 2.5 V
Lead Channel Setting Sensing Sensitivity: 2 mV
MDC IDC MSMT BATTERY REMAINING LONGEVITY: 68.4 mo
MDC IDC MSMT LEADCHNL RV PACING THRESHOLD AMPLITUDE: 1.25 V
MDC IDC PG SERIAL: 7151645
MDC IDC SESS DTM: 20150716094122
MDC IDC SET LEADCHNL RV PACING AMPLITUDE: 2.5 V
MDC IDC SET LEADCHNL RV PACING PULSEWIDTH: 0.6 ms
MDC IDC STAT BRADY RA PERCENT PACED: 99.53 %

## 2013-11-26 NOTE — Progress Notes (Signed)
Pacemaker check in clinic. Normal device function. Thresholds, sensing, impedances consistent with previous measurements. Device programmed to maximize longevity. No mode switch or high ventricular rates noted. Device programmed at appropriate safety margins. Histogram distribution appropriate for patient activity level. Device programmed to optimize intrinsic conduction. Estimated longevity 5.3 to 5.7 years. Patient enrolled in remote follow-up. Merlin 03-01-14 and ROV in 6 mths w/JA.

## 2013-12-03 ENCOUNTER — Encounter: Payer: Self-pay | Admitting: Interventional Cardiology

## 2013-12-03 ENCOUNTER — Ambulatory Visit (INDEPENDENT_AMBULATORY_CARE_PROVIDER_SITE_OTHER): Payer: Medicare Other | Admitting: Interventional Cardiology

## 2013-12-03 VITALS — BP 119/62 | HR 64 | Ht 67.5 in | Wt 193.0 lb

## 2013-12-03 DIAGNOSIS — I509 Heart failure, unspecified: Secondary | ICD-10-CM

## 2013-12-03 DIAGNOSIS — I209 Angina pectoris, unspecified: Secondary | ICD-10-CM

## 2013-12-03 DIAGNOSIS — Z950 Presence of cardiac pacemaker: Secondary | ICD-10-CM

## 2013-12-03 DIAGNOSIS — R0989 Other specified symptoms and signs involving the circulatory and respiratory systems: Secondary | ICD-10-CM

## 2013-12-03 DIAGNOSIS — I5032 Chronic diastolic (congestive) heart failure: Secondary | ICD-10-CM

## 2013-12-03 DIAGNOSIS — R0609 Other forms of dyspnea: Secondary | ICD-10-CM

## 2013-12-03 DIAGNOSIS — I4891 Unspecified atrial fibrillation: Secondary | ICD-10-CM

## 2013-12-03 DIAGNOSIS — R0600 Dyspnea, unspecified: Secondary | ICD-10-CM

## 2013-12-03 DIAGNOSIS — Z7901 Long term (current) use of anticoagulants: Secondary | ICD-10-CM

## 2013-12-03 DIAGNOSIS — I480 Paroxysmal atrial fibrillation: Secondary | ICD-10-CM

## 2013-12-03 LAB — BRAIN NATRIURETIC PEPTIDE: Pro B Natriuretic peptide (BNP): 232 pg/mL — ABNORMAL HIGH (ref 0.0–100.0)

## 2013-12-03 LAB — CBC WITH DIFFERENTIAL/PLATELET
Basophils Absolute: 0 10*3/uL (ref 0.0–0.1)
Basophils Relative: 0.2 % (ref 0.0–3.0)
EOS ABS: 0.1 10*3/uL (ref 0.0–0.7)
Eosinophils Relative: 1.6 % (ref 0.0–5.0)
HCT: 35 % — ABNORMAL LOW (ref 39.0–52.0)
Hemoglobin: 11.7 g/dL — ABNORMAL LOW (ref 13.0–17.0)
LYMPHS PCT: 20.1 % (ref 12.0–46.0)
Lymphs Abs: 1.2 10*3/uL (ref 0.7–4.0)
MCHC: 33.3 g/dL (ref 30.0–36.0)
MCV: 98.8 fl (ref 78.0–100.0)
Monocytes Absolute: 0.6 10*3/uL (ref 0.1–1.0)
Monocytes Relative: 10.9 % (ref 3.0–12.0)
NEUTROS PCT: 67.2 % (ref 43.0–77.0)
Neutro Abs: 4 10*3/uL (ref 1.4–7.7)
PLATELETS: 173 10*3/uL (ref 150.0–400.0)
RBC: 3.55 Mil/uL — ABNORMAL LOW (ref 4.22–5.81)
RDW: 16.8 % — ABNORMAL HIGH (ref 11.5–15.5)
WBC: 5.9 10*3/uL (ref 4.0–10.5)

## 2013-12-03 LAB — BASIC METABOLIC PANEL
BUN: 39 mg/dL — ABNORMAL HIGH (ref 6–23)
CALCIUM: 9.1 mg/dL (ref 8.4–10.5)
CO2: 36 meq/L — AB (ref 19–32)
CREATININE: 2.5 mg/dL — AB (ref 0.4–1.5)
Chloride: 97 mEq/L (ref 96–112)
GFR: 30.56 mL/min — AB (ref >60.00)
GLUCOSE: 259 mg/dL — AB (ref 70–99)
Potassium: 3.9 mEq/L (ref 3.5–5.1)
Sodium: 137 mEq/L (ref 135–145)

## 2013-12-03 LAB — CK: Total CK: 164 U/L (ref 7–232)

## 2013-12-03 MED ORDER — NITROGLYCERIN 0.4 MG SL SUBL: 0.4000 mg | SUBLINGUAL_TABLET | SUBLINGUAL | Status: AC | PRN

## 2013-12-03 NOTE — Patient Instructions (Addendum)
Your physician has recommended you make the following change in your medication:  1) START Nitro-glycerin sub.an Rx has been sent to your pharmacy.   Do not use Nitro if your systolic bp is under 765  Lab Today: Bmet, Bnp. Cbc. Cpk  You have a follow up appointment scheduled for 01/06/14 @ 9am

## 2013-12-03 NOTE — Progress Notes (Signed)
Patient ID: [name], male   DOB: 29-Sep-1918, 80 y.o.   MRN: [id number]    [address], [address] [place],   [address] Phone: [phone] Fax:  [phone]  Date:  12/03/2013   ID:  [name], DOB August 01, 1918, MRN [id number]  PCP:  [name], MD   ASSESSMENT:  1. Chest tightness, concerning for angina  2. Dyspnea, Occurring with chest tightness  3. Chronic diastolic heart failure with recent weight gain 4. Paroxysmal atrial fibrillation 5. Chronic kidney disease  PLAN:  1. CBC and BNP to evaluate dyspnea 2. Basic metabolic panel to evaluate renal function 3. CPK level 4. One month clinical followup 5. Sublingual nitroglycerin to be use for episodes of chest tightness 6. Cautioned against using hydralazine unless blood pressure is persistently above 150 mmHg  SUBJECTIVE: [name] is a 80 y.o. male who has had gradual weight gain and recurring episodes of chest tightness and diaphoresis. These episodes can last up to 15-20 minutes. He denies orthopnea. Is no peripheral edema. He has not had palpitations.His clinical course continues to be somewhat rocky. An emergency room visit 2 weeks ago because of sudden leg weakness.   Wt Readings from Last 3 Encounters:  12/03/13 193 lb (87.544 kg)  11/20/13 190 lb 1 oz (86.212 kg)  11/03/13 190 lb (86.183 kg)     Past Medical History  Diagnosis Date  . Diabetes mellitus   . Hyperlipidemia   . Coronary artery disease   . Spinal stenosis   . Pneumonia   . Pacemaker   . Anemia   . Carotid artery disease   . PVD (peripheral vascular disease)   . Hyponatremia     Chronic  . CVA (cerebral infarction)   . CKD (chronic kidney disease) stage 4, GFR 15-29 ml/min   . Long term (current) use of anticoagulants     No bleeding on Eliquis  . Dysrhythmia   . PSVT (paroxysmal supraventricular tachycardia)   . PAF (paroxysmal atrial fibrillation)     Asymptomatic. On Eliquis.  . AV block, 3rd degree    With DDD St. Jude PM, initially placed in 1993 by Dr. Nils Pyle. Device upgrade 10/2002 to DDD, by Dr. Rollene Fare complicated by bleeding.  . Essential hypertension, benign   . CHF (congestive heart failure)   . Chronic diastolic congestive heart failure     ECHO 05/20/12 LVEF estimated by 2D at 55-60%  . Arthritis   . Shortness of breath     Current Outpatient Prescriptions  Medication Sig Dispense Refill  . allopurinol (ZYLOPRIM) 100 MG tablet Take 200 mg by mouth daily.       [name] amiodarone (PACERONE) 200 MG tablet Take 200 mg by mouth daily.      [name] apixaban (ELIQUIS) 2.5 MG TABS tablet Take 2.5 mg by mouth 2 (two) times daily.      [name] dutasteride (AVODART) 0.5 MG capsule Take 0.5 mg by mouth daily.       . ferrous sulfate 325 (65 FE) MG tablet Take 325 mg by mouth every other day.       . fexofenadine (ALLEGRA) 180 MG tablet Take 180 mg by mouth daily.      . Flaxseed, Linseed, (BL FLAX SEED OIL PO) Take 1 capsule by mouth 2 (two) times daily.      . hydrALAZINE (APRESOLINE) 25 MG tablet Take 25 mg by mouth every 12 (twelve) hours.       . insulin aspart (NOVOLOG  FLEXPEN) 100 UNIT/ML FlexPen Inject 2-9 Units into the skin 3 (three) times daily with meals.      . insulin glargine (LANTUS) 100 UNIT/ML injection Inject 9-10 Units into the skin at bedtime.       . isosorbide dinitrate (ISORDIL) 20 MG tablet Take 1 tablet (20 mg total) by mouth 2 (two) times daily.  60 tablet  11  . metoprolol tartrate (LOPRESSOR) 25 MG tablet Take 1 tablet (25 mg total) by mouth daily.  30 tablet  6  . Multiple Vitamin (MULTIVITAMIN WITH MINERALS) TABS Take 1 tablet by mouth daily. Diabetic pak by Petra Kuba from [employer]      . Polyethyl Glycol-Propyl Glycol (SYSTANE) 0.4-0.3 % SOLN Place 1 drop into both eyes daily as needed (dry eyes). Into affected eyes      . polyethylene glycol (MIRALAX / GLYCOLAX) packet Take 17 g by mouth 2 (two) times daily as needed for mild constipation. For constipation      . Polyvinyl  Alcohol-Povidone (REFRESH OP) Place 1 drop into both eyes 2 (two) times daily. Refresh Gel Opth      . potassium chloride SA (K-DUR,KLOR-CON) 20 MEQ tablet take 1 tablet by mouth once daily  30 tablet  1  . silodosin (RAPAFLO) 8 MG CAPS capsule Take 8 mg by mouth daily with breakfast.      . Tafluprost (ZIOPTAN) 0.0015 % SOLN Place 1 drop into both eyes at bedtime. Use at night      . torsemide (DEMADEX) 20 MG tablet Take 60-80 mg by mouth 2 (two) times daily. Take 4 tablets in the morning and 3 tablets at night       No current facility-administered medications for this visit.    Allergies:    Allergies  Allergen Reactions  . Aggrenox [Aspirin-Dipyridamole Er] Other (See Comments)    Dizziness  . Carvedilol Other (See Comments)    Dizziness  . Claritin [Loratadine] Other (See Comments)    Dizziness & drowsiness   . Mucinex [Guaifenesin Er] Other (See Comments)    Dizziness, drowsiness  . Plavix [Clopidogrel Bisulfate] Other (See Comments)    Dizziness  . Rapaflo [Silodosin] Other (See Comments)    Dizziness for about 30- 45  minutes  . Statins Other (See Comments)    rhabdomyolisis  . Travatan Z [Travoprost] Other (See Comments)    Eye irritation    Social History:  The patient  reports that he quit smoking about 55 years ago. His smoking use included Pipe. He has never used smokeless tobacco. He reports that he does not drink alcohol or use illicit drugs.   ROS:  Please see the history of present illness.   No peripheral edema. No syncope. He feels dizzy intermittently.    All other systems reviewed and negative.   OBJECTIVE: VS:  BP 119/62  Pulse 64  Ht 5' 7.5" (1.715 m)  Wt 193 lb (87.544 kg)  BMI 29.76 kg/m2 Well nourished, well developed, in no acute distress, elderly HEENT: normal Neck: JVD flat. Carotid bruit absent  Cardiac:  normal S1, S2; RRR; no murmur Lungs:  clear to auscultation bilaterally, no wheezing, rhonchi or rales Abd: soft, nontender, no  hepatomegaly Ext: Edema absent. Pulses 2+ Skin: warm and dry Neuro:  CNs 2-12 intact, no focal abnormalities noted  EKG:  Not performed       Signed, Illene Labrador III, MD 12/03/2013 9:25 AM

## 2013-12-04 ENCOUNTER — Telehealth: Payer: Self-pay

## 2013-12-04 MED ORDER — TORSEMIDE 20 MG PO TABS: ORAL_TABLET | ORAL | Status: DC

## 2013-12-04 NOTE — Telephone Encounter (Signed)
pt aware of lb results and Dr.Smith's instructions.Decrease torsemide to 4 tablets each a.m. (80 mg) and 2 tablets each p.m. (40 mg). pt verbalized understanding.

## 2013-12-04 NOTE — Telephone Encounter (Signed)
Message copied by Lamar Laundry on Fri Dec 04, 2013  2:02 PM ------      Message from: Daneen Schick      Created: Fri Dec 04, 2013  1:04 PM       Decrease torsemide to 4 tablets each a.m. (80 mg) and 2 tablets each p.m. (40 mg) ------

## 2013-12-08 ENCOUNTER — Other Ambulatory Visit: Payer: Self-pay | Admitting: *Deleted

## 2013-12-08 MED ORDER — POTASSIUM CHLORIDE CRYS ER 20 MEQ PO TBCR: 20.0000 meq | EXTENDED_RELEASE_TABLET | Freq: Every day | ORAL | Status: DC

## 2013-12-11 ENCOUNTER — Telehealth: Payer: Self-pay | Admitting: Interventional Cardiology

## 2013-12-11 ENCOUNTER — Other Ambulatory Visit: Payer: Self-pay | Admitting: Orthopedic Surgery

## 2013-12-11 DIAGNOSIS — M48061 Spinal stenosis, lumbar region without neurogenic claudication: Secondary | ICD-10-CM

## 2013-12-11 NOTE — Telephone Encounter (Signed)
It will be okay to hold Eliquis

## 2013-12-11 NOTE — Telephone Encounter (Signed)
New Prob    Pt has  Lumbar epidermal steroid injection planned. Requesting perform for him to hold Eliquis 2 days prior. Please call.

## 2013-12-11 NOTE — Telephone Encounter (Signed)
Left message for Danielle regarding Dr. Thompson Caul clearance that patient may hold Eliquis for 2 days.  I advised Andee Poles to call our office with further questions or concerns.

## 2013-12-11 NOTE — Telephone Encounter (Signed)
Can patient hold Eliquis 2 days prior to [facility]? Hx of CVA but can't find exact date, possibly 2012.

## 2013-12-12 ENCOUNTER — Telehealth: Payer: Self-pay | Admitting: Nurse Practitioner

## 2013-12-12 NOTE — Telephone Encounter (Signed)
Pt called to report that since his torsemide dose was reduced (7/23) he feels like his UO is less.  His wt has been stable however and he has not had any Ss.  I advised that I would not change anything with his diuretic dosing so long as his wt is stable and he is asymptomatic.  He further asked to clarify proper usage of sl NTG as he does sometimes have c/p when he gets up in the middle of the night to use the bathroom.  We discussed proper usage and he verbalized understanding.

## 2013-12-13 ENCOUNTER — Emergency Department (HOSPITAL_COMMUNITY)
Admission: EM | Admit: 2013-12-13 | Discharge: 2013-12-13 | Disposition: A | Discharge status: Home / Self Care | Payer: Medicare Other | Attending: Emergency Medicine | Admitting: Emergency Medicine

## 2013-12-13 ENCOUNTER — Encounter (HOSPITAL_COMMUNITY): Payer: Self-pay | Admitting: Emergency Medicine

## 2013-12-13 DIAGNOSIS — I2510 Atherosclerotic heart disease of native coronary artery without angina pectoris: Secondary | ICD-10-CM | POA: Diagnosis not present

## 2013-12-13 DIAGNOSIS — Z87891 Personal history of nicotine dependence: Secondary | ICD-10-CM | POA: Diagnosis not present

## 2013-12-13 DIAGNOSIS — Z8673 Personal history of transient ischemic attack (TIA), and cerebral infarction without residual deficits: Secondary | ICD-10-CM | POA: Diagnosis not present

## 2013-12-13 DIAGNOSIS — N184 Chronic kidney disease, stage 4 (severe): Secondary | ICD-10-CM | POA: Insufficient documentation

## 2013-12-13 DIAGNOSIS — E119 Type 2 diabetes mellitus without complications: Secondary | ICD-10-CM | POA: Insufficient documentation

## 2013-12-13 DIAGNOSIS — I472 Ventricular tachycardia, unspecified: Secondary | ICD-10-CM | POA: Insufficient documentation

## 2013-12-13 DIAGNOSIS — I129 Hypertensive chronic kidney disease with stage 1 through stage 4 chronic kidney disease, or unspecified chronic kidney disease: Secondary | ICD-10-CM | POA: Insufficient documentation

## 2013-12-13 DIAGNOSIS — Z8739 Personal history of other diseases of the musculoskeletal system and connective tissue: Secondary | ICD-10-CM | POA: Diagnosis not present

## 2013-12-13 DIAGNOSIS — E785 Hyperlipidemia, unspecified: Secondary | ICD-10-CM | POA: Diagnosis not present

## 2013-12-13 DIAGNOSIS — Z79899 Other long term (current) drug therapy: Secondary | ICD-10-CM | POA: Insufficient documentation

## 2013-12-13 DIAGNOSIS — Z8701 Personal history of pneumonia (recurrent): Secondary | ICD-10-CM | POA: Insufficient documentation

## 2013-12-13 DIAGNOSIS — Z7901 Long term (current) use of anticoagulants: Secondary | ICD-10-CM | POA: Diagnosis not present

## 2013-12-13 DIAGNOSIS — I5032 Chronic diastolic (congestive) heart failure: Secondary | ICD-10-CM | POA: Diagnosis not present

## 2013-12-13 DIAGNOSIS — Z950 Presence of cardiac pacemaker: Secondary | ICD-10-CM | POA: Diagnosis not present

## 2013-12-13 DIAGNOSIS — I4729 Other ventricular tachycardia: Secondary | ICD-10-CM | POA: Insufficient documentation

## 2013-12-13 DIAGNOSIS — E871 Hypo-osmolality and hyponatremia: Secondary | ICD-10-CM | POA: Insufficient documentation

## 2013-12-13 DIAGNOSIS — Z794 Long term (current) use of insulin: Secondary | ICD-10-CM | POA: Diagnosis not present

## 2013-12-13 DIAGNOSIS — D649 Anemia, unspecified: Secondary | ICD-10-CM | POA: Insufficient documentation

## 2013-12-13 DIAGNOSIS — R338 Other retention of urine: Secondary | ICD-10-CM | POA: Insufficient documentation

## 2013-12-13 LAB — URINALYSIS, ROUTINE W REFLEX MICROSCOPIC
Bilirubin Urine: NEGATIVE
GLUCOSE, UA: NEGATIVE mg/dL
HGB URINE DIPSTICK: NEGATIVE
Ketones, ur: NEGATIVE mg/dL
Leukocytes, UA: NEGATIVE
Nitrite: NEGATIVE
Protein, ur: NEGATIVE mg/dL
Specific Gravity, Urine: 1.013 (ref 1.005–1.030)
Urobilinogen, UA: 0.2 mg/dL (ref 0.0–1.0)
pH: 5.5 (ref 5.0–8.0)

## 2013-12-13 NOTE — ED Notes (Signed)
The pt has had urinary retention since yesterday am.  Hx of chf and this makes his breathing worse  No sob at present

## 2013-12-13 NOTE — Discharge Instructions (Signed)
Acute Urinary Retention °Acute urinary retention is the temporary inability to urinate. °This is a common problem in older men. As men age their prostates become larger and block the flow of urine from the bladder. This is usually a problem that has come on gradually.  °HOME CARE INSTRUCTIONS °If you are sent home with a Foley catheter and a drainage system, you will need to discuss the best course of action with your health care provider. While the catheter is in, maintain a good intake of fluids. Keep the drainage bag emptied and lower than your catheter. This is so that contaminated urine will not flow back into your bladder, which could lead to a urinary tract infection. °There are two main types of drainage bags. One is a large bag that usually is used at night. It has a good capacity that will allow you to sleep through the night without having to empty it. The second type is called a leg bag. It has a smaller capacity, so it needs to be emptied more frequently. However, the main advantage is that it can be attached by a leg strap and can go underneath your clothing, allowing you the freedom to move about or leave your home. °Only take over-the-counter or prescription medicines for pain, discomfort, or fever as directed by your health care provider.  °SEEK MEDICAL CARE IF: °· You develop a low-grade fever. °· You experience spasms or leakage of urine with the spasms. °SEEK IMMEDIATE MEDICAL CARE IF:  °· You develop chills or fever. °· Your catheter stops draining urine. °· Your catheter falls out. °· You start to develop increased bleeding that does not respond to rest and increased fluid intake. °MAKE SURE YOU: °· Understand these instructions. °· Will watch your condition. °· Will get help right away if you are not doing well or get worse. °Document Released: 08/06/2000 Document Revised: 05/05/2013 Document Reviewed: 10/09/2012 °ExitCare® Patient Information ©2015 [employer]. This information is not  intended to replace advice given to you by your health care provider. Make sure you discuss any questions you have with your health care provider. ° ° ° °Foley Catheter Care °A Foley catheter is a soft, flexible tube that is placed into the bladder to drain urine. A Foley catheter may be inserted if: °· You leak urine or are not able to control when you urinate (urinary incontinence). °· You are not able to urinate when you need to (urinary retention). °· You had prostate surgery or surgery on the genitals. °· You have certain medical conditions, such as multiple sclerosis, dementia, or a spinal cord injury. °If you are going home with a Foley catheter in place, follow the instructions below. °TAKING CARE OF THE CATHETER °1. Wash your hands with soap and water. °2. Using mild soap and warm water on a clean washcloth: °· Clean the area on your body closest to the catheter insertion site using a circular motion, moving away from the catheter. Never wipe toward the catheter because this could sweep bacteria up into the urethra and cause infection. °· Remove all traces of soap. Pat the area dry with a clean towel. For males, reposition the foreskin. °3. Attach the catheter to your leg so there is no tension on the catheter. Use adhesive tape or a leg strap. If you are using adhesive tape, remove any sticky residue left behind by the previous tape you used. °4. Keep the drainage bag below the level of the bladder, but keep it off the floor. °  5. Check throughout the day to be sure the catheter is working and urine is draining freely. Make sure the tubing does not become kinked. °6. Do not pull on the catheter or try to remove it. Pulling could damage internal tissues. °TAKING CARE OF THE DRAINAGE BAGS °You will be given two drainage bags to take home. One is a large overnight drainage bag, and the other is a smaller leg bag that fits underneath clothing. You may wear the overnight bag at any time, but you should never  wear the smaller leg bag at night. Follow the instructions below for how to empty, change, and clean your drainage bags. °Emptying the Drainage Bag °You must empty your drainage bag when it is  -½ full or at least 2-3 times a day. °1. Wash your hands with soap and water. °2. Keep the drainage bag below your hips, below the level of your bladder. This stops urine from going back into the tubing and into your bladder. °3. Hold the dirty bag over the toilet or a clean container. °4. Open the pour spout at the bottom of the bag and empty the urine into the toilet or container. Do not let the pour spout touch the toilet, container, or any other surface. Doing so can place bacteria on the bag, which can cause an infection. °5. Clean the pour spout with a gauze pad or cotton ball that has rubbing alcohol on it. °6. Close the pour spout. °7. Attach the bag to your leg with adhesive tape or a leg strap. °8. Wash your hands well. °Changing the Drainage Bag °Change your drainage bag once a month or sooner if it starts to smell bad or look dirty. Below are steps to follow when changing the drainage bag. °1. Wash your hands with soap and water. °2. Pinch off the rubber catheter so that urine does not spill out. °3. Disconnect the catheter tube from the drainage tube at the connection valve. Do not let the tubes touch any surface. °4. Clean the end of the catheter tube with an alcohol wipe. Use a different alcohol wipe to clean the end of the drainage tube. °5. Connect the catheter tube to the drainage tube of the clean drainage bag. °6. Attach the new bag to the leg with adhesive tape or a leg strap. Avoid attaching the new bag too tightly. °7. Wash your hands well. °Cleaning the Drainage Bag °1. Wash your hands with soap and water. °2. Wash the bag in warm, soapy water. °3. Rinse the bag thoroughly with warm water. °4. Fill the bag with a solution of white vinegar and water (1 cup vinegar to 1 qt warm water [.2 L vinegar to 1 L  warm water]). Close the bag and soak it for 30 minutes in the solution. °5. Rinse the bag with warm water. °6. Hang the bag to dry with the pour spout open and hanging downward. °7. Store the clean bag (once it is dry) in a clean plastic bag. °8. Wash your hands well. °PREVENTING INFECTION °· Wash your hands before and after handling your catheter. °· Take showers daily and wash the area where the catheter enters your body. Do not take baths. Replace wet leg straps with dry ones, if this applies. °· Do not use powders, sprays, or lotions on the genital area. Only use creams, lotions, or ointments as directed by your caregiver. °· For females, wipe from front to back after each bowel movement. °· Drink enough fluids to   keep your urine clear or pale yellow unless you have a fluid restriction. °· Do not let the drainage bag or tubing touch or lie on the floor. °· Wear cotton underwear to absorb moisture and to keep your skin drier. °SEEK MEDICAL CARE IF:  °· Your urine is cloudy or smells unusually bad. °· Your catheter becomes clogged. °· You are not draining urine into the bag or your bladder feels full. °· Your catheter starts to leak. °SEEK IMMEDIATE MEDICAL CARE IF:  °· You have pain, swelling, redness, or pus where the catheter enters the body. °· You have pain in the abdomen, legs, lower back, or bladder. °· You have a fever. °· You see blood fill the catheter, or your urine is pink or red. °· You have nausea, vomiting, or chills. °· Your catheter gets pulled out. °MAKE SURE YOU:  °· Understand these instructions. °· Will watch your condition. °· Will get help right away if you are not doing well or get worse. °Document Released: 04/30/2005 Document Revised: 09/14/2013 Document Reviewed: 04/21/2012 °ExitCare® Patient Information ©2015 [employer]. This information is not intended to replace advice given to you by your health care provider. Make sure you discuss any questions you have with your health care  provider. ° °

## 2013-12-13 NOTE — ED Notes (Signed)
Pt discharged with foley and leg bag secured. Will follow up with nephrology on Monday.

## 2013-12-13 NOTE — ED Notes (Signed)
MD at bedside. 

## 2013-12-13 NOTE — ED Provider Notes (Signed)
CSN: [id number]     Arrival date & time 12/13/13  0611 History   First MD Initiated Contact with Patient 12/13/13 [phone]     Chief Complaint  Patient presents with  . Urinary Retention     (Consider location/radiation/quality/duration/timing/severity/associated sxs/prior Treatment) The history is provided by the patient.   80 year old male has not been able to urinate since yesterday morning. He has history of urinary retention and has required Foley catheter in the past.  Past Medical History  Diagnosis Date  . Diabetes mellitus   . Hyperlipidemia   . Coronary artery disease   . Spinal stenosis   . Pneumonia   . Pacemaker   . Anemia   . Carotid artery disease   . PVD (peripheral vascular disease)   . Hyponatremia     Chronic  . CVA (cerebral infarction)   . CKD (chronic kidney disease) stage 4, GFR 15-29 ml/min   . Long term (current) use of anticoagulants     No bleeding on Eliquis  . Dysrhythmia   . PSVT (paroxysmal supraventricular tachycardia)   . PAF (paroxysmal atrial fibrillation)     Asymptomatic. On Eliquis.  . AV block, 3rd degree     With DDD St. Jude PM, initially placed in 1993 by Dr. Nils Pyle. Device upgrade 10/2002 to DDD, by Dr. Rollene Fare complicated by bleeding.  . Essential hypertension, benign   . CHF (congestive heart failure)   . Chronic diastolic congestive heart failure     ECHO 05/20/12 LVEF estimated by 2D at 55-60%  . Arthritis   . Shortness of breath    Past Surgical History  Procedure Laterality Date  . Lumbar laminectomy  1995  . Back surgery    . Total knee arthroplasty Left   . Quadriceps tendon repair    . Cataract extraction    . Carotid endarterectomy    . Yag laser application Right 3/73/4287    Procedure: YAG LASER CAPSULOTOMY OF RIGHT EYE;  Surgeon: [name], MD;  Location: [facility];  Service: Ophthalmology;  Laterality: Right;  . Tonsillectomy    . Insert / replace / remove pacemaker      DDD St. Jude PM, initially  placed in 1993 by Dr. Nils Pyle. Device upgrade 10/2002 to DDD, by Dr. Rollene Fare complicated by bleeding.  . Carotid endarterectomy Right 2006  . Transurethral resection of prostate    . Cardioversion N/A 06/16/2013    Procedure: CARDIOVERSION BEDSIDE;  Surgeon: [name], MD;  Location: [facility] OR;  Service: Cardiovascular;  Laterality: N/A;   Family History  Problem Relation Age of Onset  . Multiple myeloma Father   . Hypertension Mother    History  Substance Use Topics  . Smoking status: Former Smoker -- 2 years    Types: Pipe    Quit date: 05/14/1958  . Smokeless tobacco: Never Used  . Alcohol Use: No     Comment: Cocktails occasionally    Review of Systems  All other systems reviewed and are negative.     Allergies  Aggrenox; Carvedilol; Claritin; Mucinex; Plavix; Rapaflo; Statins; and Travatan z  Home Medications   Prior to Admission medications   Medication Sig Start Date End Date Taking? Authorizing Provider  allopurinol (ZYLOPRIM) 100 MG tablet Take 200 mg by mouth daily.     Historical Provider, MD  amiodarone (PACERONE) 200 MG tablet Take 200 mg by mouth daily. 06/22/13   Belva Crome III, MD  apixaban (ELIQUIS) 2.5 MG TABS tablet Take  2.5 mg by mouth 2 (two) times daily.    Historical Provider, MD  dutasteride (AVODART) 0.5 MG capsule Take 0.5 mg by mouth daily.     Historical Provider, MD  ferrous sulfate 325 (65 FE) MG tablet Take 325 mg by mouth every other day.     Historical Provider, MD  fexofenadine (ALLEGRA) 180 MG tablet Take 180 mg by mouth daily.    Historical Provider, MD  Flaxseed, Linseed, (BL FLAX SEED OIL PO) Take 1 capsule by mouth 2 (two) times daily.    Historical Provider, MD  hydrALAZINE (APRESOLINE) 25 MG tablet Take 25 mg by mouth every 12 (twelve) hours.     Historical Provider, MD  insulin aspart (NOVOLOG FLEXPEN) 100 UNIT/ML FlexPen Inject 2-9 Units into the skin 3 (three) times daily with meals.    Historical Provider, MD  insulin  glargine (LANTUS) 100 UNIT/ML injection Inject 9-10 Units into the skin at bedtime.     Historical Provider, MD  isosorbide dinitrate (ISORDIL) 20 MG tablet Take 1 tablet (20 mg total) by mouth 2 (two) times daily. 08/14/13   Belva Crome III, MD  metoprolol tartrate (LOPRESSOR) 25 MG tablet Take 1 tablet (25 mg total) by mouth daily. 11/12/13   Belva Crome III, MD  Multiple Vitamin (MULTIVITAMIN WITH MINERALS) TABS Take 1 tablet by mouth daily. Diabetic pak by Petra Kuba from [employer] Provider, MD  nitroGLYCERIN (NITROSTAT) 0.4 MG SL tablet Place 1 tablet (0.4 mg total) under the tongue every 5 (five) minutes as needed for chest pain. 12/03/13   [name], MD  Polyethyl Glycol-Propyl Glycol (SYSTANE) 0.4-0.3 % SOLN Place 1 drop into both eyes daily as needed (dry eyes). Into affected eyes    Historical Provider, MD  polyethylene glycol (MIRALAX / GLYCOLAX) packet Take 17 g by mouth 2 (two) times daily as needed for mild constipation. For constipation    Historical Provider, MD  Polyvinyl Alcohol-Povidone (REFRESH OP) Place 1 drop into both eyes 2 (two) times daily. Refresh Gel Opth    Historical Provider, MD  potassium chloride SA (K-DUR,KLOR-CON) 20 MEQ tablet Take 1 tablet (20 mEq total) by mouth daily. 12/08/13   Darlin Coco, MD  silodosin (RAPAFLO) 8 MG CAPS capsule Take 8 mg by mouth daily with breakfast.    Historical Provider, MD  Tafluprost (ZIOPTAN) 0.0015 % SOLN Place 1 drop into both eyes at bedtime. Use at night    Historical Provider, MD  torsemide (DEMADEX) 20 MG tablet Take 4 tablets(8m) in the morning  and 2 tablets(416m at night 12/04/13   HeBelva CromeII, MD   BP 109/45  Pulse 60  Temp(Src) 98.3 F (36.8 C) (Oral)  Resp 19  Ht '5\' 7"'  (1.702 m)  Wt 186 lb (84.369 kg)  BMI 29.12 kg/m2  SpO2 92% Physical Exam  Nursing note and vitals reviewed.  [age]ear old male, resting comfortably and in no acute distress. Vital signs are normal. Oxygen saturation is  92%, which is normal. Head is normocephalic and atraumatic. PERRLA, EOMI. Oropharynx is clear. Neck is nontender and supple without adenopathy or JVD. Back is nontender and there is no CVA tenderness. Lungs are clear without rales, wheezes, or rhonchi. Chest is nontender. Heart has regular rate and rhythm without murmur. Abdomen is soft, flat, nontender. Bladder is distended. There are no other masses or hepatosplenomegaly and peristalsis is normoactive. Extremities have 1+ edema, full range of motion is present. Skin is warm and dry  without rash. Neurologic: Mental status is normal, cranial nerves are intact, there are no motor or sensory deficits.  ED Course  Procedures (including critical care time) Labs Review Results for orders placed during the hospital encounter of 12/13/13  URINALYSIS, ROUTINE W REFLEX MICROSCOPIC      Result Value Ref Range   Color, Urine YELLOW  YELLOW   APPearance CLEAR  CLEAR   Specific Gravity, Urine 1.013  1.005 - 1.030   pH 5.5  5.0 - 8.0   Glucose, UA NEGATIVE  NEGATIVE mg/dL   Hgb urine dipstick NEGATIVE  NEGATIVE   Bilirubin Urine NEGATIVE  NEGATIVE   Ketones, ur NEGATIVE  NEGATIVE mg/dL   Protein, ur NEGATIVE  NEGATIVE mg/dL   Urobilinogen, UA 0.2  0.0 - 1.0 mg/dL   Nitrite NEGATIVE  NEGATIVE   Leukocytes, UA NEGATIVE  NEGATIVE   MDM   Final diagnoses:  Acute urinary retention    Acute urinary retention. Old records are reviewed and he has prior ED visits for her urinary retention as well as for atrial fibrillation and CHF. Foley catheter will be placed.  He feels much better following placement of Foley catheter although catheter only drains about 300 mL of urine. His wife states that he had actually passed some urine with a bowel movement. He is discharged with a Foley catheter in place and is to follow up with his urologist tomorrow.  Delora Fuel, MD 84/03/75 4360

## 2013-12-14 ENCOUNTER — Telehealth: Payer: Self-pay | Admitting: Interventional Cardiology

## 2013-12-14 LAB — URINE CULTURE
CULTURE: NO GROWTH
Colony Count: NO GROWTH

## 2013-12-14 NOTE — Telephone Encounter (Signed)
Spoke with patient and his caregiver, [name], who state patient is uncertain as to how much Demadex he is supposed to be taking.  Patient called the on-call provider, Ignacia Bayley, NP over the weekend and states he was advised to take an extra Demadex for lower extremity edema. I advised patient that [name] prescribed Demadex 80 mg in the am and 40 mg in the pm and that note by Ignacia Bayley, NP does not state that he recommended a change in patient's diuretic.  Patient and [name] report that they do not see any lower extremity edema today; states his abdomen gets tight at times but does not persist.  Patient states his weight is steady at 193.7 lb for several days.  Patient denies SOB; states when he wakes up he has some "chest tightness" but this resolves when he gets up and moves around.  [name] states patient is urinating well - states he has a catheter and she is measuring urine output and she agrees that his abdominal distention could have been r/t to retention of urine.  [name] states patient woke up at 0100 today and BP was 168/77 and she gave him a hydralazine.  Patient woke up again at 0743 and BP was 168/69.  Patient went back to sleep and at 0946 BP was 124/56, HR 60 bpm.  [name] states patient's BP at 1221 was 135/70, HR 62 bpm.  Patient reports he has not taken NTG since they were given to him at 7/23 ov with [name].  I advised patient that if he has chest pain that he believes is cardiac in nature, including but not limited to SOB, nausea, pain that radiates or is not positional.  I advised patient that he may want to contact his PCP regarding the "tightness" that he feels when he wakes up in the morning since it gets better after he moves around and patient describes it as feeling like something is stuck in his throat.  Patient has follow-up with [name] 8/26.  I advised him to call back sooner if he has worsening symptoms, concerns, or questions. Patient verbalized understanding and agreement.

## 2013-12-14 NOTE — Telephone Encounter (Signed)
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  She has a question regarding his demadex. Please call.

## 2013-12-17 ENCOUNTER — Telehealth: Payer: Self-pay

## 2013-12-17 MED ORDER — ISOSORBIDE MONONITRATE ER 60 MG PO TB24: 60.0000 mg | ORAL_TABLET | Freq: Every day | ORAL | Status: DC

## 2013-12-17 NOTE — Telephone Encounter (Signed)
called to pt to give Dr.Smith's instructions lmtcb

## 2013-12-18 ENCOUNTER — Emergency Department (HOSPITAL_COMMUNITY): Payer: Medicare Other

## 2013-12-18 ENCOUNTER — Encounter (HOSPITAL_COMMUNITY): Payer: Self-pay | Admitting: Emergency Medicine

## 2013-12-18 ENCOUNTER — Inpatient Hospital Stay (HOSPITAL_COMMUNITY)
Admission: EM | Admit: 2013-12-18 | Discharge: 2013-12-21 | DRG: 292 | Disposition: A | Discharge status: Home / Self Care | Payer: Medicare Other | Attending: Interventional Cardiology | Admitting: Interventional Cardiology

## 2013-12-18 ENCOUNTER — Inpatient Hospital Stay: Admission: RE | Admit: 2013-12-18 | Payer: Medicare Other | Source: Ambulatory Visit

## 2013-12-18 DIAGNOSIS — Z7901 Long term (current) use of anticoagulants: Secondary | ICD-10-CM

## 2013-12-18 DIAGNOSIS — E871 Hypo-osmolality and hyponatremia: Secondary | ICD-10-CM | POA: Diagnosis present

## 2013-12-18 DIAGNOSIS — Z79899 Other long term (current) drug therapy: Secondary | ICD-10-CM | POA: Diagnosis not present

## 2013-12-18 DIAGNOSIS — N184 Chronic kidney disease, stage 4 (severe): Secondary | ICD-10-CM | POA: Diagnosis present

## 2013-12-18 DIAGNOSIS — I5030 Unspecified diastolic (congestive) heart failure: Secondary | ICD-10-CM | POA: Diagnosis present

## 2013-12-18 DIAGNOSIS — I2510 Atherosclerotic heart disease of native coronary artery without angina pectoris: Secondary | ICD-10-CM | POA: Diagnosis present

## 2013-12-18 DIAGNOSIS — I5033 Acute on chronic diastolic (congestive) heart failure: Secondary | ICD-10-CM | POA: Diagnosis present

## 2013-12-18 DIAGNOSIS — I4891 Unspecified atrial fibrillation: Secondary | ICD-10-CM | POA: Diagnosis present

## 2013-12-18 DIAGNOSIS — E785 Hyperlipidemia, unspecified: Secondary | ICD-10-CM | POA: Diagnosis present

## 2013-12-18 DIAGNOSIS — I442 Atrioventricular block, complete: Secondary | ICD-10-CM | POA: Diagnosis present

## 2013-12-18 DIAGNOSIS — Z8673 Personal history of transient ischemic attack (TIA), and cerebral infarction without residual deficits: Secondary | ICD-10-CM | POA: Diagnosis not present

## 2013-12-18 DIAGNOSIS — E119 Type 2 diabetes mellitus without complications: Secondary | ICD-10-CM | POA: Diagnosis present

## 2013-12-18 DIAGNOSIS — I509 Heart failure, unspecified: Secondary | ICD-10-CM | POA: Diagnosis not present

## 2013-12-18 DIAGNOSIS — Z87891 Personal history of nicotine dependence: Secondary | ICD-10-CM

## 2013-12-18 DIAGNOSIS — Z950 Presence of cardiac pacemaker: Secondary | ICD-10-CM

## 2013-12-18 DIAGNOSIS — Z794 Long term (current) use of insulin: Secondary | ICD-10-CM

## 2013-12-18 DIAGNOSIS — R079 Chest pain, unspecified: Secondary | ICD-10-CM

## 2013-12-18 DIAGNOSIS — I129 Hypertensive chronic kidney disease with stage 1 through stage 4 chronic kidney disease, or unspecified chronic kidney disease: Secondary | ICD-10-CM | POA: Diagnosis present

## 2013-12-18 DIAGNOSIS — Z96659 Presence of unspecified artificial knee joint: Secondary | ICD-10-CM | POA: Diagnosis not present

## 2013-12-18 DIAGNOSIS — I480 Paroxysmal atrial fibrillation: Secondary | ICD-10-CM

## 2013-12-18 DIAGNOSIS — I208 Other forms of angina pectoris: Secondary | ICD-10-CM

## 2013-12-18 DIAGNOSIS — I2089 Other forms of angina pectoris: Secondary | ICD-10-CM

## 2013-12-18 DIAGNOSIS — R339 Retention of urine, unspecified: Secondary | ICD-10-CM

## 2013-12-18 LAB — CREATININE, SERUM
CREATININE: 2.27 mg/dL — AB (ref 0.50–1.35)
GFR, EST AFRICAN AMERICAN: 27 mL/min — AB (ref >90)
GFR, EST NON AFRICAN AMERICAN: 23 mL/min — AB (ref >90)

## 2013-12-18 LAB — BASIC METABOLIC PANEL
ANION GAP: 15 (ref 5–15)
BUN: 39 mg/dL — ABNORMAL HIGH (ref 6–23)
CALCIUM: 8.9 mg/dL (ref 8.4–10.5)
CO2: 26 mEq/L (ref 19–32)
Chloride: 100 mEq/L (ref 96–112)
Creatinine, Ser: 2.31 mg/dL — ABNORMAL HIGH (ref 0.50–1.35)
GFR calc Af Amer: 26 mL/min — ABNORMAL LOW (ref >90)
GFR calc non Af Amer: 23 mL/min — ABNORMAL LOW (ref >90)
Glucose, Bld: 82 mg/dL (ref 70–99)
Potassium: 4.8 mEq/L (ref 3.7–5.3)
Sodium: 141 mEq/L (ref 137–147)

## 2013-12-18 LAB — I-STAT TROPONIN, ED
TROPONIN I, POC: 0.15 ng/mL — AB (ref 0.00–0.08)
Troponin i, poc: 0.16 ng/mL (ref 0.00–0.08)

## 2013-12-18 LAB — CBC
HEMATOCRIT: 35.6 % — AB (ref 39.0–52.0)
HEMOGLOBIN: 11.6 g/dL — AB (ref 13.0–17.0)
MCH: 32 pg (ref 26.0–34.0)
MCHC: 32.6 g/dL (ref 30.0–36.0)
MCV: 98.1 fL (ref 78.0–100.0)
Platelets: 191 10*3/uL (ref 150–400)
RBC: 3.63 MIL/uL — AB (ref 4.22–5.81)
RDW: 15.4 % (ref 11.5–15.5)
WBC: 5.6 10*3/uL (ref 4.0–10.5)

## 2013-12-18 LAB — CBC WITH DIFFERENTIAL/PLATELET
BASOS ABS: 0 10*3/uL (ref 0.0–0.1)
BASOS PCT: 0 % (ref 0–1)
EOS ABS: 0.1 10*3/uL (ref 0.0–0.7)
Eosinophils Relative: 2 % (ref 0–5)
HCT: 35 % — ABNORMAL LOW (ref 39.0–52.0)
Hemoglobin: 11.3 g/dL — ABNORMAL LOW (ref 13.0–17.0)
Lymphocytes Relative: 23 % (ref 12–46)
Lymphs Abs: 1.3 10*3/uL (ref 0.7–4.0)
MCH: 32.9 pg (ref 26.0–34.0)
MCHC: 32.3 g/dL (ref 30.0–36.0)
MCV: 102 fL — ABNORMAL HIGH (ref 78.0–100.0)
MONO ABS: 0.6 10*3/uL (ref 0.1–1.0)
Monocytes Relative: 10 % (ref 3–12)
NEUTROS ABS: 3.5 10*3/uL (ref 1.7–7.7)
Neutrophils Relative %: 65 % (ref 43–77)
Platelets: 166 10*3/uL (ref 150–400)
RBC: 3.43 MIL/uL — ABNORMAL LOW (ref 4.22–5.81)
RDW: 15.8 % — AB (ref 11.5–15.5)
WBC: 5.5 10*3/uL (ref 4.0–10.5)

## 2013-12-18 LAB — GLUCOSE, CAPILLARY
GLUCOSE-CAPILLARY: 119 mg/dL — AB (ref 70–99)
Glucose-Capillary: 272 mg/dL — ABNORMAL HIGH (ref 70–99)

## 2013-12-18 LAB — PRO B NATRIURETIC PEPTIDE: Pro B Natriuretic peptide (BNP): 3146 pg/mL — ABNORMAL HIGH (ref 0–450)

## 2013-12-18 LAB — CBG MONITORING, ED: Glucose-Capillary: 79 mg/dL (ref 70–99)

## 2013-12-18 MED ORDER — HYDRALAZINE HCL 25 MG PO TABS
25.0000 mg | ORAL_TABLET | Freq: Two times a day (BID) | ORAL | Status: DC
  Administered 2013-12-18 – 2013-12-21 (×7): 25 mg via ORAL
  Filled 2013-12-18 (×9): qty 1

## 2013-12-18 MED ORDER — FUROSEMIDE 10 MG/ML IJ SOLN
80.0000 mg | Freq: Once | INTRAMUSCULAR | Status: AC
  Administered 2013-12-18: 80 mg via INTRAVENOUS
  Filled 2013-12-18: qty 8

## 2013-12-18 MED ORDER — LIVING BETTER WITH HEART FAILURE BOOK
Freq: Once | Status: DC
  Filled 2013-12-18: qty 1

## 2013-12-18 MED ORDER — INSULIN GLARGINE 100 UNIT/ML ~~LOC~~ SOLN
12.0000 [IU] | Freq: Every day | SUBCUTANEOUS | Status: DC
  Administered 2013-12-18 – 2013-12-20 (×3): 12 [IU] via SUBCUTANEOUS
  Filled 2013-12-18 (×4): qty 0.12

## 2013-12-18 MED ORDER — APIXABAN 2.5 MG PO TABS
2.5000 mg | ORAL_TABLET | Freq: Two times a day (BID) | ORAL | Status: DC
  Administered 2013-12-18 – 2013-12-21 (×6): 2.5 mg via ORAL
  Filled 2013-12-18 (×7): qty 1

## 2013-12-18 MED ORDER — POTASSIUM CHLORIDE CRYS ER 20 MEQ PO TBCR
20.0000 meq | EXTENDED_RELEASE_TABLET | Freq: Every day | ORAL | Status: DC
  Administered 2013-12-18 – 2013-12-21 (×4): 20 meq via ORAL
  Filled 2013-12-18 (×4): qty 1

## 2013-12-18 MED ORDER — DUTASTERIDE 0.5 MG PO CAPS
0.5000 mg | ORAL_CAPSULE | Freq: Every day | ORAL | Status: DC
  Administered 2013-12-18 – 2013-12-20 (×3): 0.5 mg via ORAL
  Filled 2013-12-18 (×3): qty 1

## 2013-12-18 MED ORDER — FUROSEMIDE 10 MG/ML IJ SOLN
80.0000 mg | Freq: Two times a day (BID) | INTRAMUSCULAR | Status: DC
  Administered 2013-12-18 – 2013-12-20 (×4): 80 mg via INTRAVENOUS
  Filled 2013-12-18 (×6): qty 8

## 2013-12-18 MED ORDER — METOPROLOL TARTRATE 25 MG PO TABS
25.0000 mg | ORAL_TABLET | Freq: Every day | ORAL | Status: DC
  Administered 2013-12-18 – 2013-12-21 (×4): 25 mg via ORAL
  Filled 2013-12-18 (×4): qty 1

## 2013-12-18 MED ORDER — ALLOPURINOL 100 MG PO TABS
200.0000 mg | ORAL_TABLET | Freq: Every day | ORAL | Status: DC
  Administered 2013-12-18 – 2013-12-21 (×4): 200 mg via ORAL
  Filled 2013-12-18 (×4): qty 2

## 2013-12-18 MED ORDER — POLYVINYL ALCOHOL 1.4 % OP SOLN
1.0000 [drp] | Freq: Two times a day (BID) | OPHTHALMIC | Status: DC
  Administered 2013-12-18 – 2013-12-20 (×4): 1 [drp] via OPHTHALMIC
  Filled 2013-12-18: qty 15

## 2013-12-18 MED ORDER — SODIUM CHLORIDE 0.9 % IJ SOLN
3.0000 mL | INTRAMUSCULAR | Status: DC | PRN
  Administered 2013-12-21: 3 mL via INTRAVENOUS

## 2013-12-18 MED ORDER — LATANOPROST 0.005 % OP SOLN
1.0000 [drp] | Freq: Every day | OPHTHALMIC | Status: DC
  Administered 2013-12-18 – 2013-12-19 (×2): 1 [drp] via OPHTHALMIC
  Filled 2013-12-18 (×2): qty 2.5

## 2013-12-18 MED ORDER — ONDANSETRON HCL 4 MG/2ML IJ SOLN: 4.0000 mg | Freq: Four times a day (QID) | INTRAMUSCULAR | Status: DC | PRN

## 2013-12-18 MED ORDER — NITROGLYCERIN 0.4 MG SL SUBL
0.4000 mg | SUBLINGUAL_TABLET | SUBLINGUAL | Status: DC | PRN
  Administered 2013-12-20: 0.4 mg via SUBLINGUAL
  Filled 2013-12-18: qty 1

## 2013-12-18 MED ORDER — ADULT MULTIVITAMIN W/MINERALS CH
1.0000 | ORAL_TABLET | Freq: Every day | ORAL | Status: DC
  Administered 2013-12-18 – 2013-12-21 (×4): 1 via ORAL
  Filled 2013-12-18 (×4): qty 1

## 2013-12-18 MED ORDER — FERROUS SULFATE 325 (65 FE) MG PO TABS
325.0000 mg | ORAL_TABLET | ORAL | Status: DC
  Administered 2013-12-18 – 2013-12-20 (×2): 325 mg via ORAL
  Filled 2013-12-18 (×2): qty 1

## 2013-12-18 MED ORDER — ACETAMINOPHEN 325 MG PO TABS: 650.0000 mg | ORAL_TABLET | ORAL | Status: DC | PRN

## 2013-12-18 MED ORDER — INSULIN ASPART 100 UNIT/ML ~~LOC~~ SOLN
0.0000 [IU] | Freq: Three times a day (TID) | SUBCUTANEOUS | Status: DC
  Administered 2013-12-19: 3 [IU] via SUBCUTANEOUS
  Administered 2013-12-19: 2 [IU] via SUBCUTANEOUS
  Administered 2013-12-20: 7 [IU] via SUBCUTANEOUS
  Administered 2013-12-20: 2 [IU] via SUBCUTANEOUS
  Administered 2013-12-21: 5 [IU] via SUBCUTANEOUS
  Administered 2013-12-21: 2 [IU] via SUBCUTANEOUS

## 2013-12-18 MED ORDER — ISOSORBIDE MONONITRATE ER 60 MG PO TB24
60.0000 mg | ORAL_TABLET | Freq: Every day | ORAL | Status: DC
  Administered 2013-12-18 – 2013-12-21 (×4): 60 mg via ORAL
  Filled 2013-12-18 (×4): qty 1

## 2013-12-18 MED ORDER — SODIUM CHLORIDE 0.9 % IV SOLN: 250.0000 mL | INTRAVENOUS | Status: DC | PRN

## 2013-12-18 MED ORDER — SODIUM CHLORIDE 0.9 % IJ SOLN
3.0000 mL | Freq: Two times a day (BID) | INTRAMUSCULAR | Status: DC
  Administered 2013-12-18 – 2013-12-21 (×5): 3 mL via INTRAVENOUS

## 2013-12-18 MED ORDER — AMIODARONE HCL 200 MG PO TABS
200.0000 mg | ORAL_TABLET | Freq: Every day | ORAL | Status: DC
  Administered 2013-12-18 – 2013-12-21 (×4): 200 mg via ORAL
  Filled 2013-12-18 (×4): qty 1

## 2013-12-18 MED ORDER — INSULIN ASPART 100 UNIT/ML FLEXPEN: 2.0000 [IU] | PEN_INJECTOR | Freq: Three times a day (TID) | SUBCUTANEOUS | Status: DC

## 2013-12-18 MED ORDER — TAMSULOSIN HCL 0.4 MG PO CAPS
0.4000 mg | ORAL_CAPSULE | Freq: Every day | ORAL | Status: DC
  Administered 2013-12-19 – 2013-12-20 (×2): 0.4 mg via ORAL
  Filled 2013-12-18 (×4): qty 1

## 2013-12-18 MED ORDER — POLYVINYL ALCOHOL 1.4 % OP SOLN: 1.0000 [drp] | Freq: Every day | OPHTHALMIC | Status: DC | PRN

## 2013-12-18 MED ORDER — RANOLAZINE ER 500 MG PO TB12
500.0000 mg | ORAL_TABLET | Freq: Two times a day (BID) | ORAL | Status: DC
  Administered 2013-12-18 – 2013-12-21 (×7): 500 mg via ORAL
  Filled 2013-12-18 (×8): qty 1

## 2013-12-18 MED ORDER — POLYETHYLENE GLYCOL 3350 17 G PO PACK
17.0000 g | PACK | Freq: Two times a day (BID) | ORAL | Status: DC | PRN
  Filled 2013-12-18: qty 1

## 2013-12-18 NOTE — ED Notes (Signed)
Patient has been experiencing chest pain "for a while now," which has gotten worse tonight and has been unable to sleep. Chest pain is substernal non radiating which he describes as a "discomfort." Patient states he has shortness of breath that "comes and goes." Patient has pacemaker for symptomatic bradycardia. Pain 5/10. A/o x4. Denies n/v or diaphoresis.

## 2013-12-18 NOTE — ED Provider Notes (Signed)
CSN: [id number]     Arrival date & time 12/18/13  0449 History   First MD Initiated Contact with Patient 12/18/13 0510     Chief Complaint  Patient presents with  . Chest Pain  . Shortness of Breath     (Consider location/radiation/quality/duration/timing/severity/associated sxs/prior Treatment) HPI Wakes up at night last few nights with ~1mn mild CP and SOB resolves with 2 NTG; weight stable no edema; pain free now. No fever, cough. No Sxs during daytime. Positive orthopnea.  L:CTA  Past Medical History  Diagnosis Date  . Diabetes mellitus   . Hyperlipidemia   . Coronary artery disease   . Spinal stenosis   . Pneumonia   . Pacemaker   . Anemia   . Carotid artery disease   . PVD (peripheral vascular disease)   . Hyponatremia     Chronic  . CVA (cerebral infarction)   . CKD (chronic kidney disease) stage 4, GFR 15-29 ml/min   . Long term (current) use of anticoagulants     No bleeding on Eliquis  . Dysrhythmia   . PSVT (paroxysmal supraventricular tachycardia)   . PAF (paroxysmal atrial fibrillation)     Asymptomatic. On Eliquis.  . AV block, 3rd degree     With DDD St. Jude PM, initially placed in 1993 by Dr. NNils Pyle Device upgrade 10/2002 to DDD, by Dr. WRollene Farecomplicated by bleeding.  . Essential hypertension, benign   . CHF (congestive heart failure)   . Chronic diastolic congestive heart failure     ECHO 05/20/12 LVEF estimated by 2D at 55-60%  . Arthritis   . Shortness of breath    Past Surgical History  Procedure Laterality Date  . Lumbar laminectomy  1995  . Back surgery    . Total knee arthroplasty Left   . Quadriceps tendon repair    . Cataract extraction    . Carotid endarterectomy    . Yag laser application Right 67/42/5956   Procedure: YAG LASER CAPSULOTOMY OF RIGHT EYE;  Surgeon: TMyrtha Mantis, MD;  [place] [facility]  Service: Ophthalmology;  Laterality: Right;  . Tonsillectomy    . Insert / replace / remove pacemaker      DDD St. Jude  PM, initially placed in 1993 by Dr. NNils Pyle Device upgrade 10/2002 to DDD, by Dr. WRollene Farecomplicated by bleeding.  . Carotid endarterectomy Right 2006  . Transurethral resection of prostate    . Cardioversion N/A 06/16/2013    Procedure: CARDIOVERSION BEDSIDE;  Surgeon: HSinclair Grooms MD;  [facility];  Service: Cardiovascular;  Laterality: N/A;   Family History  Problem Relation Age of Onset  . Multiple myeloma Father   . Hypertension Mother    History  Substance Use Topics  . Smoking status: Former Smoker -- 2 years    Types: Pipe    Quit date: 05/14/1958  . Smokeless tobacco: Never Used  . Alcohol Use: No     Comment: Cocktails occasionally    Review of Systems  10 Systems reviewed and are negative for acute change except as noted in the HPI.  Allergies  Aggrenox; Carvedilol; Claritin; Mucinex; Plavix; Rapaflo; Statins; and Travatan z  Home Medications   Prior to Admission medications   Medication Sig Start Date End Date Taking? Authorizing Provider  allopurinol (ZYLOPRIM) 100 MG tablet Take 200 mg by mouth daily.    Yes Historical Provider, MD  amiodarone (PACERONE) 200 MG tablet Take 200 mg by mouth daily. 06/22/13  Yes HMallie Mussel  Carlye Grippe, MD  apixaban (ELIQUIS) 2.5 MG TABS tablet Take 2.5 mg by mouth 2 (two) times daily.   Yes Historical Provider, MD  fexofenadine (ALLEGRA) 180 MG tablet Take 180 mg by mouth daily.   Yes Historical Provider, MD  Flaxseed, Linseed, (BL FLAX SEED OIL PO) Take 1 capsule by mouth 2 (two) times daily.   Yes Historical Provider, MD  hydrALAZINE (APRESOLINE) 25 MG tablet Take 25 mg by mouth every 12 (twelve) hours.    Yes Historical Provider, MD  insulin aspart (NOVOLOG FLEXPEN) 100 UNIT/ML FlexPen Inject 2-9 Units into the skin 3 (three) times daily with meals. Sliding scale   Yes Historical Provider, MD  insulin glargine (LANTUS) 100 UNIT/ML injection Inject 12 Units into the skin at bedtime.   Yes Historical Provider, MD  isosorbide  mononitrate (IMDUR) 60 MG 24 hr tablet Take 1 tablet (60 mg total) by mouth daily. 12/17/13  Yes Belva Crome III, MD  metoprolol tartrate (LOPRESSOR) 25 MG tablet Take 1 tablet (25 mg total) by mouth daily. 11/12/13  Yes [name], MD  Multiple Vitamin (MULTIVITAMIN WITH MINERALS) TABS Take 1 tablet by mouth daily. Diabetic pak by Petra Kuba from [facility]   Yes Historical Provider, MD  nitroGLYCERIN (NITROSTAT) 0.4 MG SL tablet Place 1 tablet (0.4 mg total) under the tongue every 5 (five) minutes as needed for chest pain. 12/03/13  Yes [name], MD  Polyethyl Glycol-Propyl Glycol (SYSTANE) 0.4-0.3 % SOLN Place 1 drop into both eyes daily as needed (dry eyes). Into affected eyes   Yes Historical Provider, MD  polyethylene glycol (MIRALAX / GLYCOLAX) packet Take 17 g by mouth 2 (two) times daily as needed for mild constipation. For constipation   Yes Historical Provider, MD  Polyvinyl Alcohol-Povidone (REFRESH OP) Place 1 drop into both eyes 2 (two) times daily. Refresh Gel Opth   Yes Historical Provider, MD  potassium chloride SA (K-DUR,KLOR-CON) 20 MEQ tablet Take 1 tablet (20 mEq total) by mouth daily. 12/08/13  Yes Darlin Coco, MD  Tafluprost (ZIOPTAN) 0.0015 % SOLN Place 1 drop into both eyes at bedtime. Use at night   Yes Historical Provider, MD  torsemide (DEMADEX) 20 MG tablet Take 40-80 mg by mouth See admin instructions. Take 4 tablets in the morning and 2 tablets in the afternoon.   Yes Historical Provider, MD  dutasteride (AVODART) 0.5 MG capsule Take 0.5 mg by mouth daily.     Historical Provider, MD  ferrous sulfate 325 (65 FE) MG tablet Take 325 mg by mouth every other day.     Historical Provider, MD  silodosin (RAPAFLO) 8 MG CAPS capsule Take 8 mg by mouth daily with breakfast.    Historical Provider, MD   BP 115/65  Pulse 62  Temp(Src) 98.3 F (36.8 C) (Oral)  Resp 18  Ht '5\' 7"'  (1.702 m)  Wt 197 lb 1.5 oz (89.4 kg)  BMI 30.86 kg/m2  SpO2 95% Physical Exam  Nursing  note and vitals reviewed. Constitutional:  Awake, alert, nontoxic appearance.  HENT:  Head: Atraumatic.  Eyes: Right eye exhibits no discharge. Left eye exhibits no discharge.  Neck: Neck supple.  Cardiovascular: Normal rate and regular rhythm.   No murmur heard. Pulmonary/Chest: Effort normal and breath sounds normal. No respiratory distress. He has no wheezes. He has no rales. He exhibits no tenderness.  RA sat normal 94%  Abdominal: Soft. Bowel sounds are normal. He exhibits no distension. There is no tenderness. There is no  rebound and no guarding.  Musculoskeletal: He exhibits no edema and no tenderness.  Baseline ROM, no obvious new focal weakness.  Neurological: He is alert.  Mental status and motor strength appears baseline for patient and situation.  Skin: No rash noted.  Psychiatric: He has a normal mood and affect.    ED Course  Procedures (including critical care time) Cards to see for Dispo. [place] METABOLIC PANEL - Abnormal; Notable for the following:    BUN 39 (*)    Creatinine, Ser 2.31 (*)    GFR calc non Af Amer 23 (*)    GFR calc Af Amer 26 (*)    All other components within normal limits  CBC WITH DIFFERENTIAL - Abnormal; Notable for the following:    RBC 3.43 (*)    Hemoglobin 11.3 (*)    HCT 35.0 (*)    MCV 102.0 (*)    RDW 15.8 (*)    All other components within normal limits  PRO B NATRIURETIC PEPTIDE - Abnormal; Notable for the following:    Pro B Natriuretic peptide (BNP) 3146.0 (*)    All other components within normal limits  CBC - Abnormal; Notable for the following:    RBC 3.63 (*)    Hemoglobin 11.6 (*)    HCT 35.6 (*)    All other components within normal limits  CREATININE, SERUM - Abnormal; Notable for the following:    Creatinine, Ser 2.27 (*)    GFR calc non Af Amer 23 (*)    GFR calc Af Amer 27 (*)    All other components within normal limits  GLUCOSE, CAPILLARY - Abnormal; Notable for the  following:    Glucose-Capillary 119 (*)    All other components within normal limits  BASIC METABOLIC PANEL - Abnormal; Notable for the following:    Glucose, Bld 108 (*)    BUN 37 (*)    Creatinine, Ser 2.12 (*)    GFR calc non Af Amer 25 (*)    GFR calc Af Amer 29 (*)    All other components within normal limits  GLUCOSE, CAPILLARY - Abnormal; Notable for the following:    Glucose-Capillary 272 (*)    All other components within normal limits  GLUCOSE, CAPILLARY - Abnormal; Notable for the following:    Glucose-Capillary 248 (*)    All other components within normal limits  I-STAT TROPOININ, ED - Abnormal; Notable for the following:    Troponin i, poc 0.16 (*)    All other components within normal limits  I-STAT TROPOININ, ED - Abnormal; Notable for the following:    Troponin i, poc 0.15 (*)    All other components within normal limits  GLUCOSE, CAPILLARY  CBG MONITORING, ED    Imaging Review Dg Chest Port 1 View  12/18/2013   CLINICAL DATA:  Chest pain and shortness of breath.  EXAM: PORTABLE CHEST - 1 VIEW  COMPARISON:  Chest radiograph November 20, 2013  FINDINGS: The cardiac silhouette remains mildly enlarged, mediastinal silhouette is nonsuspicious. Central pulmonary vascular congestion with bibasilar patchy airspace opacities. No pleural effusions. No pneumothorax. Dual lead left cardiac pacemaker in situ. No pneumothorax.  Soft tissue planes and included osseous structures unremarkable.  IMPRESSION: Stable cardiomegaly, increasing central pulmonary vascular congestion with bibasilar patchy airspace opacities which could reflect confluent edema, less likely pneumonia. Recommend followup chest radiograph after treatment to verify improvement.   Electronically Signed   By: Elon Alas  On: 12/18/2013 05:57     EKG Interpretation   Date/Time:  Friday December 18 2013 04:53:51 EDT Ventricular Rate:  64 PR Interval:    QRS Duration: 100 QT Interval:  410 QTC Calculation:  422 R Axis:   58 Text Interpretation:  Electronic atrial pacemaker Nonspecific ST and T  wave abnormality No significant change since last tracing Confirmed by  [facility]  MD, Jenny Reichmann ([id number]) on 12/18/2013 7:28:27 AM      MDM   Final diagnoses:  Chest pain, unspecified chest pain type    Patient / Family / Caregiver informed of clinical course, understand medical decision-making process, and agree with plan.  Cards  To determine dispo.    Babette Relic, MD 12/19/13 [phone]

## 2013-12-18 NOTE — ED Notes (Signed)
Ordered breakfast for patient 

## 2013-12-18 NOTE — Progress Notes (Signed)
I reviewed the clinical data. Chest x-ray shows congestive heart failure. Cardiac markers are minimally elevated. He has been having nocturnal angina. At his age and with chronic kidney disease, angiography seems inappropriate. I have spoken to [facility] about end-of-life and CODE STATUS. He was to remain a full code. He has given discretion should he end up on life support to "do what I think is best".  The plan for this weekend will be several days of IV furosemide to clear CHF. Hopefully by Sunday he can be switched to an oral diuretic regimen, perhaps torsemide 80 mg twice a day.

## 2013-12-18 NOTE — ED Provider Notes (Signed)
11:00 AM  Dr. Johnsie Cancel with cardiology to admit.  Ford Cliff, DO 12/18/13 1114

## 2013-12-18 NOTE — ED Notes (Signed)
Phlebotomy at bedside to draw blood.  

## 2013-12-18 NOTE — ED Notes (Signed)
Dr. Bednar at bedside. 

## 2013-12-18 NOTE — H&P (Signed)
Physician History and Physical     Patient ID: [name] MRN: [id number] DOB/AGE: 1918/07/12 80 y.o. 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 He has a history of coronary artery disease, permanent pacemaker, paroxysmal atrial fibrillation, chronic kidney disease, and chronic diastolic heart failure. Recently he has been feeling poorly. [name] on 12/11/13  Having increasing dyspnea and angina  Angina is stereotypical.  At night wakes him up around 2:00 am  Nitro relevies  Associated with dyspnea Can "work" all day and have no issues Pain only in middle of night . His son and caretaker with him in ER.  He ambulates with brace on leg and walker Clinically appears to have sleep apnea with snoring and decreased sats while I was in room  He came to ER for more chest pain and dyspnea  Relieved with nitro and one dose iv lasix 80 mg  Has been compliant with meds .     Review of systems complete and found to be negative unless listed above   Past Medical History  Diagnosis Date  . Diabetes mellitus   . Hyperlipidemia   . Coronary artery disease   . Spinal stenosis   . Pneumonia   . Pacemaker   . Anemia   . Carotid artery disease   . PVD (peripheral vascular disease)   . Hyponatremia     Chronic  . CVA (cerebral infarction)   . CKD (chronic kidney disease) stage 4, GFR 15-29 ml/min   . Long term (current) use of anticoagulants     No bleeding on Eliquis  . Dysrhythmia   . PSVT (paroxysmal supraventricular tachycardia)   . PAF (paroxysmal atrial fibrillation)     Asymptomatic. On Eliquis.  . AV block, 3rd degree     With DDD St. Jude PM, initially placed in 1993 by Dr. Nils Pyle. Device upgrade 10/2002 to DDD, by Dr. Rollene Fare  complicated by bleeding.  . Essential hypertension, benign   . CHF (congestive heart failure)   . Chronic diastolic congestive heart failure     ECHO 05/20/12 LVEF estimated by 2D at 55-60%  . Arthritis   . Shortness of breath     Family History  Problem Relation Age of Onset  . Multiple myeloma Father   . Hypertension Mother     History   Social History  . Marital Status: Widowed    Spouse Name: N/A    Number of Children: 7  . Years of Education: N/A   Occupational History  . MD    Social History Main Topics  . Smoking status: Former Smoker -- 2 years    Types: Pipe    Quit date: 05/14/1958  . Smokeless tobacco: Never Used  . Alcohol Use: No     Comment: Cocktails occasionally  . Drug Use: No  . Sexual Activity: Not Currently   Other Topics Concern  . Not on file   Social History Narrative   Has a caregiver that lives with him. Still works regularly.    Past Surgical History  Procedure Laterality Date  . Lumbar laminectomy  1995  . Back surgery    . Total knee arthroplasty Left   . Quadriceps tendon repair    . Cataract extraction    .  Carotid endarterectomy    . Yag laser application Right 11/26/9676    Procedure: YAG LASER CAPSULOTOMY OF RIGHT EYE;  Surgeon: [name], MD;  Location: [place];  Service: Ophthalmology;  Laterality: Right;  . Tonsillectomy    . Insert / replace / remove pacemaker      DDD St. Jude PM, initially placed in 1993 by Dr. Nils Pyle. Device upgrade 10/2002 to DDD, by Dr. Rollene Fare complicated by bleeding.  . Carotid endarterectomy Right 2006  . Transurethral resection of prostate    . Cardioversion N/A 06/16/2013    Procedure: CARDIOVERSION BEDSIDE;  Surgeon: [name], MD;  Location: [place];  Service: Cardiovascular;  Laterality: N/A;      (Not in a hospital admission)  Physical Exam: Blood pressure 125/50, pulse 64, temperature 98.6 F (37 C), temperature source Oral, resp. rate 18, SpO2 95.00%.   Affect  appropriate Chronically ill black male  HEENT: normal Neck supple with no adenopathy JVP elevated  no bruits no thyromegaly Lungs cbasilar rales  no wheezing and good diaphragmatic motion Heart:  S1/S2 no murmur, no rub, gallop or click PMI normal  Pacer under left clavicle  Abdomen: benighn, BS positve, no tenderness, no AAA no bruit.  No HSM or HJR Distal pulses intact with no bruits Plus one bilateral  edema Neuro non-focal Skin warm and dry RLE weakness Foley   No current facility-administered medications on file prior to encounter.   Current Outpatient Prescriptions on File Prior to Encounter  Medication Sig Dispense Refill  . allopurinol (ZYLOPRIM) 100 MG tablet Take 200 mg by mouth daily.       [name] amiodarone (PACERONE) 200 MG tablet Take 200 mg by mouth daily.      [name] apixaban (ELIQUIS) 2.5 MG TABS tablet Take 2.5 mg by mouth 2 (two) times daily.      . fexofenadine (ALLEGRA) 180 MG tablet Take 180 mg by mouth daily.      . Flaxseed, Linseed, (BL FLAX SEED OIL PO) Take 1 capsule by mouth 2 (two) times daily.      . hydrALAZINE (APRESOLINE) 25 MG tablet Take 25 mg by mouth every 12 (twelve) hours.       . insulin aspart (NOVOLOG FLEXPEN) 100 UNIT/ML FlexPen Inject 2-9 Units into the skin 3 (three) times daily with meals. Sliding scale      . insulin glargine (LANTUS) 100 UNIT/ML injection Inject 12 Units into the skin at bedtime.      . isosorbide mononitrate (IMDUR) 60 MG 24 hr tablet Take 1 tablet (60 mg total) by mouth daily.  30 tablet  5  . metoprolol tartrate (LOPRESSOR) 25 MG tablet Take 1 tablet (25 mg total) by mouth daily.  30 tablet  6  . Multiple Vitamin (MULTIVITAMIN WITH MINERALS) TABS Take 1 tablet by mouth daily. Diabetic pak by Petra Kuba from [employer]      . nitroGLYCERIN (NITROSTAT) 0.4 MG SL tablet Place 1 tablet (0.4 mg total) under the tongue every 5 (five) minutes as needed for chest pain.  25 tablet  3  . Polyethyl Glycol-Propyl Glycol (SYSTANE) 0.4-0.3 % SOLN  Place 1 drop into both eyes daily as needed (dry eyes). Into affected eyes      . polyethylene glycol (MIRALAX / GLYCOLAX) packet Take 17 g by mouth 2 (two) times daily as needed for mild constipation. For constipation      . Polyvinyl Alcohol-Povidone (REFRESH OP) Place 1 drop into both eyes 2 (two) times daily.  Refresh Gel Opth      . potassium chloride SA (K-DUR,KLOR-CON) 20 MEQ tablet Take 1 tablet (20 mEq total) by mouth daily.  30 tablet  6  . Tafluprost (ZIOPTAN) 0.0015 % SOLN Place 1 drop into both eyes at bedtime. Use at night      . torsemide (DEMADEX) 20 MG tablet Take 40-80 mg by mouth See admin instructions. Take 4 tablets in the morning and 2 tablets in the afternoon.      . dutasteride (AVODART) 0.5 MG capsule Take 0.5 mg by mouth daily.       . ferrous sulfate 325 (65 FE) MG tablet Take 325 mg by mouth every other day.       . silodosin (RAPAFLO) 8 MG CAPS capsule Take 8 mg by mouth daily with breakfast.        Labs:   Lab Results  Component Value Date   WBC 5.5 12/18/2013   HGB 11.3* 12/18/2013   HCT 35.0* 12/18/2013   MCV 102.0* 12/18/2013   PLT 166 12/18/2013    Recent Labs Lab 12/18/13 0620  NA 141  K 4.8  CL 100  CO2 26  BUN 39*  CREATININE 2.31*  CALCIUM 8.9  GLUCOSE 82   Lab Results  Component Value Date   CKTOTAL 164 12/03/2013   CKTOTAL 202 07/15/2013   CKTOTAL 452* 05/26/2011   CKMB 3.3 05/26/2011   CKMB 3.0 05/26/2011   CKMB 2.5 05/26/2011   TROPONINI <0.30 09/14/2013   TROPONINI <0.30 09/13/2013   TROPONINI <0.30 09/13/2013        Radiology: Dg Chest Port 1 View  12/18/2013   CLINICAL DATA:  Chest pain and shortness of breath.  EXAM: PORTABLE CHEST - 1 VIEW  COMPARISON:  Chest radiograph November 20, 2013  FINDINGS: The cardiac silhouette remains mildly enlarged, mediastinal silhouette is nonsuspicious. Central pulmonary vascular congestion with bibasilar patchy airspace opacities. No pleural effusions. No pneumothorax. Dual lead left cardiac pacemaker in situ. No  pneumothorax.  Soft tissue planes and included osseous structures unremarkable.  IMPRESSION: Stable cardiomegaly, increasing central pulmonary vascular congestion with bibasilar patchy airspace opacities which could reflect confluent edema, less likely pneumonia. Recommend followup chest radiograph after treatment to verify improvement.   Electronically Signed   By: Courtnay  Bloomer   On: 12/18/2013 05:57   Dg Chest Portable 1 View  11/20/2013   CLINICAL DATA:  Shortness of breath.  , on CPAP  EXAM: PORTABLE CHEST - 1 VIEW  COMPARISON:  Portable chest x-ray of Sep 13, 2013  FINDINGS: The lungs are adequately inflated. There remain coarse interstitial markings in the mid and lower lungs bilaterally. There is no alveolar infiltrate nor pleural effusion. The cardiac silhouette is mildly enlarged. The pulmonary vascularity is not engorged. The permanent pacemaker is unchanged. There are degenerative changes of the shoulders.  IMPRESSION: There is persistent increased interstitial density in the mid and lower lungs. This may reflect scarring, interstitial pneumonia, or mild interstitial edema of cardiac or noncardiac cause.   Electronically Signed   By: David  Jordan   On: 11/20/2013 08:58    EKG:  A pacing   ASSESSMENT AND PLAN:  Chest Pain:  Given age and advanced renal failure no plans to cath.  Given nightly occurrence check sats and consider need for CPAP.  Add Ranexa 500 bid and follow Cr closely.   CHF:  Related to diastolic dysfunction and CRF.  IV bid lasix for 48 hrs then change back to home dose demedex.    CXR with infiltrates and chronically elevated BNP  Repeat CXR Sunday CRF:  Follow BMET I do not believe he is a dialysis candidate  Discussed care with Dr Smith  He needs to discuss code status and long term outlook with Dr Maestas as he is in ER quite frequently at this point  Signed: Peter Nishan8/11/2013, 10:50 AM    

## 2013-12-18 NOTE — Care Management Note (Addendum)
    Page 1 of 1   12/21/2013     11:10:40 AM CARE MANAGEMENT NOTE 12/21/2013  Patient:  [name], [name]   Account Number:  [id number]  Date Initiated:  12/18/2013  Documentation initiated by:  HUTCHINSON,CRYSTAL  Subjective/Objective Assessment:   CHF     Action/Plan:   CM to follow for disposition needs   Anticipated DC Date:  12/21/2013   Anticipated DC Plan:  [place]  CM consult      Choice offered to / List presented to:             Status of service:  Completed, signed off Medicare Important Message given?  YES (If response is "NO", the following Medicare IM given date fields will be blank) Date Medicare IM given:  12/21/2013 Medicare IM given by:  Marvetta Gibbons Date Additional Medicare IM given:   Additional Medicare IM given by:    Discharge Disposition:    Per UR Regulation:  Reviewed for med. necessity/level of care/duration of stay  If discussed at [place] of Stay Meetings, dates discussed:    Comments:  12/21/13- 1030- Marvetta Gibbons RN, BSN [phone] Pt for possible d/c today, spoke with pt at bedside who states that he is still active out of the home and does not desire HH at this time- he does have an RN with his insurance that he calls- and he has a caregiver in the home that assist him. copy of admission Medicare IM given to pt- no further needs at this time.   Crystal Hutchinson RN, BSN, MSHL, CCM  Nurse - Case Manager,  ([phone]  12/18/2013 Initial IM 12/18/13 provided by admissions Disposition Plan: pending

## 2013-12-18 NOTE — ED Notes (Signed)
Pt continues to be monitored by 12 lead, blood pressure, and pulse ox. Family continues to be at bedside.

## 2013-12-18 NOTE — Progress Notes (Signed)
Called for report. ED RN not available at this time.

## 2013-12-18 NOTE — ED Notes (Signed)
Troponin  Results given to  Ward, DO

## 2013-12-18 NOTE — Progress Notes (Signed)
PT Cancellation Note  Patient Details Name: [name] MRN: [id number] DOB: 01/01/1919   Cancelled Treatment:    Reason Eval/Treat Not Completed: Patient declined, no reason specified Pt politely declined participating in therapy today secondary to being in the ER all night and not getting much sleep. Will follow up on 8/8.   Candy Sledge A 12/18/2013, 3:27 PM Candy Sledge, [place], DPT [phone]

## 2013-12-18 NOTE — ED Notes (Addendum)
2nd RN attempted IV x2 unsuccessful. MD notified.

## 2013-12-18 NOTE — Progress Notes (Signed)
UR completed Imogene Gravelle K. Andres Vest, RN, BSN, [place], CCM  12/18/2013 3:08 PM

## 2013-12-18 NOTE — ED Notes (Signed)
Primary RN attempted IV x2 unsuccessful.

## 2013-12-19 DIAGNOSIS — I509 Heart failure, unspecified: Secondary | ICD-10-CM

## 2013-12-19 DIAGNOSIS — R079 Chest pain, unspecified: Secondary | ICD-10-CM

## 2013-12-19 LAB — GLUCOSE, CAPILLARY
GLUCOSE-CAPILLARY: 182 mg/dL — AB (ref 70–99)
Glucose-Capillary: 92 mg/dL (ref 70–99)
Glucose-Capillary: 248 mg/dL — ABNORMAL HIGH (ref 70–99)
Glucose-Capillary: 175 mg/dL — ABNORMAL HIGH (ref 70–99)

## 2013-12-19 LAB — BASIC METABOLIC PANEL
ANION GAP: 12 (ref 5–15)
BUN: 37 mg/dL — ABNORMAL HIGH (ref 6–23)
CALCIUM: 9.2 mg/dL (ref 8.4–10.5)
CO2: 30 meq/L (ref 19–32)
Chloride: 101 mEq/L (ref 96–112)
Creatinine, Ser: 2.12 mg/dL — ABNORMAL HIGH (ref 0.50–1.35)
GFR calc Af Amer: 29 mL/min — ABNORMAL LOW (ref >90)
GFR, EST NON AFRICAN AMERICAN: 25 mL/min — AB (ref >90)
Glucose, Bld: 108 mg/dL — ABNORMAL HIGH (ref 70–99)
Potassium: 3.7 mEq/L (ref 3.7–5.3)
Sodium: 143 mEq/L (ref 137–147)

## 2013-12-19 MED ORDER — POLYETHYLENE GLYCOL 3350 17 G PO PACK
17.0000 g | PACK | Freq: Two times a day (BID) | ORAL | Status: DC
  Administered 2013-12-19 – 2013-12-21 (×4): 17 g via ORAL
  Filled 2013-12-19 (×7): qty 1

## 2013-12-19 NOTE — Progress Notes (Signed)
Patient alert and oriented, worked with PT in am, patient up chair most of day, no c/o of pain, denies shortness of breath, ra, receiving iv lasix, lungs diminished, bed alarm on, continuing to monitor

## 2013-12-19 NOTE — Progress Notes (Signed)
Patient Name: [name] Date of Encounter: 12/19/2013     Active Problems:   HF (heart failure)   (HFpEF) heart failure with preserved ejection fraction    SUBJECTIVE  The patient is less dyspneic today.  No chest pain.  He had a negative I/O  however his weight is up from yesterday.  Telemetry shows atrial paced rhythm.  CURRENT MEDS . allopurinol  200 mg Oral Daily  . amiodarone  200 mg Oral Daily  . apixaban  2.5 mg Oral BID  . dutasteride  0.5 mg Oral Daily  . ferrous sulfate  325 mg Oral QODAY  . furosemide  80 mg Intravenous Q12H  . hydrALAZINE  25 mg Oral Q12H  . insulin aspart  0-9 Units Subcutaneous TID WC  . insulin glargine  12 Units Subcutaneous QHS  . isosorbide mononitrate  60 mg Oral Daily  . latanoprost  1 drop Both Eyes QHS  . Living Better with Heart Failure Book   Does not apply Once  . metoprolol tartrate  25 mg Oral Daily  . multivitamin with minerals  1 tablet Oral Daily  . polyvinyl alcohol  1 drop Both Eyes BID  . potassium chloride SA  20 mEq Oral Daily  . ranolazine  500 mg Oral BID  . sodium chloride  3 mL Intravenous Q12H  . tamsulosin  0.4 mg Oral QPC supper    OBJECTIVE  Filed Vitals:   12/18/13 1119 12/18/13 1305 12/18/13 2110 12/19/13 0442  BP: 122/73 125/69 121/54 138/57  Pulse: 62 69 60 64  Temp:  98.1 F (36.7 C) 98.1 F (36.7 C) 98 F (36.7 C)  TempSrc:  Oral    Resp: 17 16 18 18   Height:  5\' 7"  (1.702 m)    Weight:  195 lb 15.8 oz (88.9 kg)  197 lb 1.5 oz (89.4 kg)  SpO2: 98% 94% 98% 98%    Intake/Output Summary (Last 24 hours) at 12/19/13 0841 Last data filed at 12/19/13 0828  Gross per 24 hour  Intake     60 ml  Output   4025 ml  Net  -3965 ml   Filed Weights   12/18/13 1305 12/19/13 0442  Weight: 195 lb 15.8 oz (88.9 kg) 197 lb 1.5 oz (89.4 kg)    PHYSICAL EXAM  General: Pleasant, NAD. Neuro: Alert and oriented X 3. Moves all extremities spontaneously. Psych: Normal affect. HEENT:  Normal  Neck:  Supple without bruits .  JVP is elevated Lungs:  Resp regular and unlabored, minimal basilar rales.  No wheezing. Heart: RRR no s3, s4, or murmurs. Abdomen: Soft, non-tender, non-distended, BS + x 4.  Extremities: No clubbing, cyanosis or edema. DP/PT/Radials 2+ and equal bilaterally.  Accessory Clinical Findings  CBC  Recent Labs  12/18/13 0620 12/18/13 1330  WBC 5.5 5.6  NEUTROABS 3.5  --   HGB 11.3* 11.6*  HCT 35.0* 35.6*  MCV 102.0* 98.1  PLT 166 937   Basic Metabolic Panel  Recent Labs  12/18/13 0620 12/18/13 1330 12/19/13 0601  NA 141  --  143  K 4.8  --  3.7  CL 100  --  101  CO2 26  --  30  GLUCOSE 82  --  108*  BUN 39*  --  37*  CREATININE 2.31* 2.27* 2.12*  CALCIUM 8.9  --  9.2   Liver Function Tests No results found for this basename: AST, ALT, ALKPHOS, BILITOT, PROT, ALBUMIN,  in the last 72 hours No  results found for this basename: LIPASE, AMYLASE,  in the last 72 hours Cardiac Enzymes No results found for this basename: CKTOTAL, CKMB, CKMBINDEX, TROPONINI,  in the last 72 hours BNP No components found with this basename: POCBNP,  D-Dimer No results found for this basename: DDIMER,  in the last 72 hours Hemoglobin A1C No results found for this basename: HGBA1C,  in the last 72 hours Fasting Lipid Panel No results found for this basename: CHOL, HDL, LDLCALC, TRIG, CHOLHDL, LDLDIRECT,  in the last 72 hours Thyroid Function Tests No results found for this basename: TSH, T4TOTAL, FREET3, T3FREE, THYROIDAB,  in the last 72 hours  TELE  Atrial paced rhythm  ECG  Electronic atrial pacemaker Nonspecific ST and T wave abnormality No significant change since last tracing Confirmed by [facility] MD, Jenny Reichmann ([id number])  Radiology/Studies  Dg Chest Port 1 View  12/18/2013   CLINICAL DATA:  Chest pain and shortness of breath.  EXAM: PORTABLE CHEST - 1 VIEW  COMPARISON:  Chest radiograph November 20, 2013  FINDINGS: The cardiac silhouette remains mildly enlarged,  mediastinal silhouette is nonsuspicious. Central pulmonary vascular congestion with bibasilar patchy airspace opacities. No pleural effusions. No pneumothorax. Dual lead left cardiac pacemaker in situ. No pneumothorax.  Soft tissue planes and included osseous structures unremarkable.  IMPRESSION: Stable cardiomegaly, increasing central pulmonary vascular congestion with bibasilar patchy airspace opacities which could reflect confluent edema, less likely pneumonia. Recommend followup chest radiograph after treatment to verify improvement.   Electronically Signed   By: Elon Alas   On: 12/18/2013 05:57   Dg Chest Portable 1 View  11/20/2013   CLINICAL DATA:  Shortness of breath.  , on CPAP  EXAM: PORTABLE CHEST - 1 VIEW  COMPARISON:  Portable chest x-ray of Sep 13, 2013  FINDINGS: The lungs are adequately inflated. There remain coarse interstitial markings in the mid and lower lungs bilaterally. There is no alveolar infiltrate nor pleural effusion. The cardiac silhouette is mildly enlarged. The pulmonary vascularity is not engorged. The permanent pacemaker is unchanged. There are degenerative changes of the shoulders.  IMPRESSION: There is persistent increased interstitial density in the mid and lower lungs. This may reflect scarring, interstitial pneumonia, or mild interstitial edema of cardiac or noncardiac cause.   Electronically Signed   By: David  Martinique   On: 11/20/2013 08:58    ASSESSMENT AND PLAN 1.  Acute on chronic diastolic heart failure with preserved ejection 2. coronary artery disease 3. chronic kidney disease 4. permanent pacemaker 5. paroxysmal atrial fibrillation  Plan: Continue IV Lasix today, consider switch to oral torsemide tomorrow as per Dr. Thompson Caul note.  Signed, Darlin Coco MD

## 2013-12-19 NOTE — Evaluation (Signed)
Physical Therapy Evaluation Patient Details Name: [name] MRN: [id number] DOB: April 16, 1919 Today's Date: 12/19/2013   History of Present Illness  Pt admitted with CHF.  Clinical Impression  Pt admitted with CHF. Pt currently with functional limitations due to the deficits listed below (see PT Problem List).  Pt will benefit from skilled PT to increase their independence and safety with mobility to allow discharge to the venue listed below. No f/u recommendations.  Pt has all needed home equipment.      Follow Up Recommendations No PT follow up;Supervision/Assistance - 24 hour (pt declining any HHPT, will re-assess as d/c nears)    Equipment Recommendations  None recommended by PT    Recommendations for Other Services       Precautions / Restrictions Precautions Precautions: None Required Braces or Orthoses: Other Brace/Splint Other Brace/Splint: AFO LLE      Mobility  Bed Mobility Overal bed mobility: Modified Independent             General bed mobility comments: use of bedrails  Transfers Overall transfer level: Needs assistance Equipment used: Rolling walker (2 wheeled) Transfers: Sit to/from [facility] Sit to Stand: Min guard Stand pivot transfers: Min guard          Ambulation/Gait Ambulation/Gait assistance: Min guard Ambulation Distance (Feet): 150 Feet Assistive device: Rolling walker (2 wheeled) Gait Pattern/deviations: Step-through pattern;Decreased stride length Gait velocity: decreased   General Gait Details: no DOE noted with ambulation  Stairs            Wheelchair Mobility    Modified Rankin (Stroke Patients Only)       Balance                                             Pertinent Vitals/Pain Pain Assessment: No/denies pain    Home Living Family/patient expects to be discharged to:: Private residence Living Arrangements: Children Available Help at Discharge:  Family;Friend(s);Available 24 hours/day Type of Home: House Home Access: Stairs to enter Entrance Stairs-Rails: Right;Left;Can reach both Entrance Stairs-Number of Steps: 3 Home Layout: One level Home Equipment: Walker - 4 wheels;Shower seat      Prior Function Level of Independence: Independent with assistive device(s)               Hand Dominance        Extremity/Trunk Assessment   Upper Extremity Assessment: Defer to OT evaluation           Lower Extremity Assessment: Generalized weakness         Communication   Communication: HOH  Cognition Arousal/Alertness: Awake/alert Behavior During Therapy: WFL for tasks assessed/performed Overall Cognitive Status: Within Functional Limits for tasks assessed                      General Comments      Exercises        Assessment/Plan    PT Assessment Patient needs continued PT services  PT Diagnosis Difficulty walking;Generalized weakness   PT Problem List Decreased strength;Decreased activity tolerance;Decreased mobility;Cardiopulmonary status limiting activity  PT Treatment Interventions Gait training;Stair training;Functional mobility training;Therapeutic activities;Therapeutic exercise;Patient/family education;Balance training   PT Goals (Current goals can be found in the Care Plan section) Acute Rehab PT Goals Patient Stated Goal: return to work PT Goal Formulation: With patient Time For Goal Achievement: 01/02/14 Potential to Achieve Goals: Good  Frequency Min 3X/week   Barriers to discharge        Co-evaluation               End of Session Equipment Utilized During Treatment: Gait belt Activity Tolerance: Patient tolerated treatment well Patient left: in chair;with call bell/phone within reach;with family/visitor present Nurse Communication: Mobility status         Time: 2831-5176 PT Time Calculation (min): 18 min   Charges:   PT Evaluation $Initial PT Evaluation  Tier I: 1 Procedure PT Treatments $Gait Training: 8-22 mins   PT G Codes:          Lorriane Shire 12/19/2013, 10:25 AM  Lorrin Goodell, PT  Office # [phone] Pager [phone]

## 2013-12-20 LAB — GLUCOSE, CAPILLARY
GLUCOSE-CAPILLARY: 128 mg/dL — AB (ref 70–99)
GLUCOSE-CAPILLARY: 301 mg/dL — AB (ref 70–99)
GLUCOSE-CAPILLARY: 249 mg/dL — AB (ref 70–99)
Glucose-Capillary: 152 mg/dL — ABNORMAL HIGH (ref 70–99)

## 2013-12-20 LAB — BASIC METABOLIC PANEL
Anion gap: 13 (ref 5–15)
BUN: 39 mg/dL — ABNORMAL HIGH (ref 6–23)
CHLORIDE: 96 meq/L (ref 96–112)
CO2: 29 meq/L (ref 19–32)
CREATININE: 2.32 mg/dL — AB (ref 0.50–1.35)
Calcium: 9.1 mg/dL (ref 8.4–10.5)
GFR calc Af Amer: 26 mL/min — ABNORMAL LOW (ref >90)
GFR calc non Af Amer: 23 mL/min — ABNORMAL LOW (ref >90)
Glucose, Bld: 146 mg/dL — ABNORMAL HIGH (ref 70–99)
Potassium: 3.8 mEq/L (ref 3.7–5.3)
SODIUM: 138 meq/L (ref 137–147)

## 2013-12-20 MED ORDER — SILODOSIN 8 MG PO CAPS: 8.0000 mg | ORAL_CAPSULE | Freq: Every day | ORAL | Status: DC

## 2013-12-20 MED ORDER — NON FORMULARY: 8.0000 mg | Freq: Every day | Status: DC

## 2013-12-20 MED ORDER — TORSEMIDE 20 MG PO TABS
80.0000 mg | ORAL_TABLET | Freq: Two times a day (BID) | ORAL | Status: DC
  Administered 2013-12-20 – 2013-12-21 (×2): 80 mg via ORAL
  Filled 2013-12-20 (×5): qty 4

## 2013-12-20 MED ORDER — DUTASTERIDE 0.5 MG PO CAPS: 0.5000 mg | ORAL_CAPSULE | Freq: Every day | ORAL | Status: DC

## 2013-12-20 MED ORDER — NITROGLYCERIN 0.4 MG SL SUBL
0.4000 mg | SUBLINGUAL_TABLET | SUBLINGUAL | Status: DC | PRN
Start: 2013-12-20 — End: 2013-12-21

## 2013-12-20 MED ORDER — DUTASTERIDE 0.5 MG PO CAPS
0.5000 mg | ORAL_CAPSULE | Freq: Every day | ORAL | Status: DC
  Administered 2013-12-21: 0.5 mg via ORAL
  Filled 2013-12-20: qty 1

## 2013-12-20 NOTE — Progress Notes (Signed)
Pt reports chest discomfort at 2214 5/10 mid sternal with no other symptoms.  BP 147/70 heart rate 60.  1 SL ntg given with relief.  EKG obtained.  Will notify MD.

## 2013-12-20 NOTE — Progress Notes (Signed)
Patient Name: [name] Date of Encounter: 12/20/2013     Active Problems:   HF (heart failure)   (HFpEF) heart failure with preserved ejection fraction    SUBJECTIVE  The patient feels well this morning, lying flat.  Since he has been in the hospital he has not had any of the paroxysmal nocturnal episodes of chest tightness that he was experiencing at home.  His rhythm remained stable atrial paced.  CURRENT MEDS . allopurinol  200 mg Oral Daily  . amiodarone  200 mg Oral Daily  . apixaban  2.5 mg Oral BID  . dutasteride  0.5 mg Oral Daily  . ferrous sulfate  325 mg Oral QODAY  . furosemide  80 mg Intravenous Q12H  . hydrALAZINE  25 mg Oral Q12H  . insulin aspart  0-9 Units Subcutaneous TID WC  . insulin glargine  12 Units Subcutaneous QHS  . isosorbide mononitrate  60 mg Oral Daily  . latanoprost  1 drop Both Eyes QHS  . Living Better with Heart Failure Book   Does not apply Once  . metoprolol tartrate  25 mg Oral Daily  . multivitamin with minerals  1 tablet Oral Daily  . polyethylene glycol  17 g Oral BID  . polyvinyl alcohol  1 drop Both Eyes BID  . potassium chloride SA  20 mEq Oral Daily  . ranolazine  500 mg Oral BID  . sodium chloride  3 mL Intravenous Q12H  . tamsulosin  0.4 mg Oral QPC supper    OBJECTIVE  Filed Vitals:   12/19/13 0442 12/19/13 1431 12/19/13 2036 12/20/13 0552  BP: 138/57 115/65 113/55 131/59  Pulse: 64 62 59 63  Temp: 98 F (36.7 C) 98.3 F (36.8 C) 97.9 F (36.6 C) 98.3 F (36.8 C)  TempSrc:  Oral    Resp: 18 18 16 16   Height:      Weight: 197 lb 1.5 oz (89.4 kg)   193 lb 9 oz (87.8 kg)  SpO2: 98% 95% 96% 99%    Intake/Output Summary (Last 24 hours) at 12/20/13 0753 Last data filed at 12/20/13 0557  Gross per 24 hour  Intake    543 ml  Output   1850 ml  Net  -1307 ml   Filed Weights   12/18/13 1305 12/19/13 0442 12/20/13 0552  Weight: 195 lb 15.8 oz (88.9 kg) 197 lb 1.5 oz (89.4 kg) 193 lb 9 oz (87.8 kg)     PHYSICAL EXAM  General: Pleasant, NAD. Neuro: Alert and oriented X 3. Moves all extremities spontaneously. Psych: Normal affect. HEENT:  Normal  Neck: Supple without bruits.  Jugular venous pressure is elevated Lungs:  Resp regular and unlabored, CTA. Heart: RRR no s3, s4, or murmurs. Abdomen: Soft, non-tender, non-distended, BS + x 4.  Extremities: No clubbing, cyanosis or edema. DP/PT/Radials 2+ and equal bilaterally.  Accessory Clinical Findings  CBC  Recent Labs  12/18/13 0620 12/18/13 1330  WBC 5.5 5.6  NEUTROABS 3.5  --   HGB 11.3* 11.6*  HCT 35.0* 35.6*  MCV 102.0* 98.1  PLT 166 160   Basic Metabolic Panel  Recent Labs  12/19/13 0601 12/20/13 0435  NA 143 138  K 3.7 3.8  CL 101 96  CO2 30 29  GLUCOSE 108* 146*  BUN 37* 39*  CREATININE 2.12* 2.32*  CALCIUM 9.2 9.1   Liver Function Tests No results found for this basename: AST, ALT, ALKPHOS, BILITOT, PROT, ALBUMIN,  in the last 72 hours No  results found for this basename: LIPASE, AMYLASE,  in the last 72 hours Cardiac Enzymes No results found for this basename: CKTOTAL, CKMB, CKMBINDEX, TROPONINI,  in the last 72 hours BNP No components found with this basename: POCBNP,  D-Dimer No results found for this basename: DDIMER,  in the last 72 hours Hemoglobin A1C No results found for this basename: HGBA1C,  in the last 72 hours Fasting Lipid Panel No results found for this basename: CHOL, HDL, LDLCALC, TRIG, CHOLHDL, LDLDIRECT,  in the last 72 hours Thyroid Function Tests No results found for this basename: TSH, T4TOTAL, FREET3, T3FREE, THYROIDAB,  in the last 72 hours  TELE Atrial paced rhythm  ECG    Radiology/Studies  Dg Chest Port 1 View  12/18/2013   CLINICAL DATA:  Chest pain and shortness of breath.  EXAM: PORTABLE CHEST - 1 VIEW  COMPARISON:  Chest radiograph November 20, 2013  FINDINGS: The cardiac silhouette remains mildly enlarged, mediastinal silhouette is nonsuspicious. Central  pulmonary vascular congestion with bibasilar patchy airspace opacities. No pleural effusions. No pneumothorax. Dual lead left cardiac pacemaker in situ. No pneumothorax.  Soft tissue planes and included osseous structures unremarkable.  IMPRESSION: Stable cardiomegaly, increasing central pulmonary vascular congestion with bibasilar patchy airspace opacities which could reflect confluent edema, less likely pneumonia. Recommend followup chest radiograph after treatment to verify improvement.   Electronically Signed   By: Elon Alas   On: 12/18/2013 05:57   Dg Chest Portable 1 View  11/20/2013   CLINICAL DATA:  Shortness of breath.  , on CPAP  EXAM: PORTABLE CHEST - 1 VIEW  COMPARISON:  Portable chest x-ray of Sep 13, 2013  FINDINGS: The lungs are adequately inflated. There remain coarse interstitial markings in the mid and lower lungs bilaterally. There is no alveolar infiltrate nor pleural effusion. The cardiac silhouette is mildly enlarged. The pulmonary vascularity is not engorged. The permanent pacemaker is unchanged. There are degenerative changes of the shoulders.  IMPRESSION: There is persistent increased interstitial density in the mid and lower lungs. This may reflect scarring, interstitial pneumonia, or mild interstitial edema of cardiac or noncardiac cause.   Electronically Signed   By: David  Martinique   On: 11/20/2013 08:58    ASSESSMENT AND PLAN 1. Acute on chronic diastolic heart failure with preserved ejection  2. coronary artery disease  3. chronic kidney disease  4. permanent pacemaker  5. paroxysmal atrial fibrillation  Plan: His renal function is stable.  Today we will stop IV Lasix and switch to torsemide 80 mg by mouth twice a day.  Signed, Darlin Coco MD

## 2013-12-20 NOTE — Progress Notes (Signed)
Pt requested for foley catheter to be taken ou that was placed in the ERt. Dr. Mare Ferrari updated with order to discontinue only if regular Foley catheter. With instruction to do I & O/ or place foley catheter as needed if pt develops fluid retention. Pt understands.

## 2013-12-20 NOTE — Progress Notes (Signed)
PT Cancellation Note  Patient Details Name: DENI LEFEVER MRN: [id number] DOB: 07-06-1918   Cancelled Treatment:    Reason Eval/Treat Not Completed: Other (comment) (PT refused due to having frequent bowel movements and has been up and down all day.)   INGOLD,Persephone Schriever 12/20/2013, 12:27 PM  [facility] [phone] (pager)

## 2013-12-20 NOTE — Progress Notes (Signed)
Pt states he has to take his home rapaflo 8 mg each evening and his avodart 0.5mg  each morning to urinate.  Pt family brought home medication.  Medications counted with 6 of each, documented with patient and delivered to pharmacy.  Receipt in pt chart.  Will continue to monitor.

## 2013-12-20 NOTE — Progress Notes (Signed)
Late Entry - pt voided 200 cc yellow urine without difficulty.

## 2013-12-21 ENCOUNTER — Other Ambulatory Visit: Payer: Self-pay | Admitting: Cardiology

## 2013-12-21 ENCOUNTER — Telehealth: Payer: Self-pay | Admitting: Cardiology

## 2013-12-21 ENCOUNTER — Encounter (HOSPITAL_COMMUNITY): Payer: Self-pay | Admitting: General Practice

## 2013-12-21 DIAGNOSIS — I5033 Acute on chronic diastolic (congestive) heart failure: Principal | ICD-10-CM

## 2013-12-21 DIAGNOSIS — I208 Other forms of angina pectoris: Secondary | ICD-10-CM

## 2013-12-21 DIAGNOSIS — N289 Disorder of kidney and ureter, unspecified: Secondary | ICD-10-CM

## 2013-12-21 DIAGNOSIS — I509 Heart failure, unspecified: Secondary | ICD-10-CM

## 2013-12-21 DIAGNOSIS — R339 Retention of urine, unspecified: Secondary | ICD-10-CM

## 2013-12-21 LAB — BASIC METABOLIC PANEL
Anion gap: 13 (ref 5–15)
BUN: 42 mg/dL — AB (ref 6–23)
CO2: 29 mEq/L (ref 19–32)
Calcium: 9.1 mg/dL (ref 8.4–10.5)
Chloride: 96 mEq/L (ref 96–112)
Creatinine, Ser: 2.44 mg/dL — ABNORMAL HIGH (ref 0.50–1.35)
GFR, EST AFRICAN AMERICAN: 25 mL/min — AB (ref >90)
GFR, EST NON AFRICAN AMERICAN: 21 mL/min — AB (ref >90)
Glucose, Bld: 198 mg/dL — ABNORMAL HIGH (ref 70–99)
Potassium: 4.3 mEq/L (ref 3.7–5.3)
Sodium: 138 mEq/L (ref 137–147)

## 2013-12-21 LAB — GLUCOSE, CAPILLARY
GLUCOSE-CAPILLARY: 179 mg/dL — AB (ref 70–99)
GLUCOSE-CAPILLARY: 293 mg/dL — AB (ref 70–99)

## 2013-12-21 MED ORDER — RANOLAZINE ER 500 MG PO TB12: 500.0000 mg | ORAL_TABLET | Freq: Two times a day (BID) | ORAL | Status: DC

## 2013-12-21 MED ORDER — TORSEMIDE 20 MG PO TABS: 80.0000 mg | ORAL_TABLET | Freq: Two times a day (BID) | ORAL | Status: DC

## 2013-12-21 NOTE — Telephone Encounter (Signed)
[place] wanted me to contact Mr. Ballow about having some BMP labs done by Wed or Thurs. I was able to talk to [name] and notify him of Brittany's request.   Thanks

## 2013-12-21 NOTE — Progress Notes (Signed)
Physical Therapy Treatment Patient Details Name: [name] MRN: [id number] DOB: 21-Jun-1918 Today's Date: 12/21/2013    History of Present Illness Pt admitted with CHF.    PT Comments    Pt pleasant but HOH without hearing aids. Pt educated for transfers and gait with continued denial of HHPT needs as he has 24hr assist. Pt with assist to don shoes and AFO and educated for HEP with encouragement to continue mobility and HEP as able. Pt noted to have grossly 4 periods of partial right knee buckling with end of gait and stated this has also occurred at home with fatigue. Will continue to follow.   Follow Up Recommendations  No PT follow up;Supervision/Assistance - 24 hour     Equipment Recommendations       Recommendations for Other Services       Precautions / Restrictions Precautions Precautions: Fall Required Braces or Orthoses: Other Brace/Splint;Spinal Brace Spinal Brace: Lumbar corset;Applied in sitting position;Other (comment) Spinal Brace Comments: long standing for comfort grossly 25years Other Brace/Splint: AFO LLE    Mobility  Bed Mobility Overal bed mobility: Modified Independent             General bed mobility comments: use of rail and increased time  Transfers     Transfers: Sit to/from Stand Sit to Stand: Supervision         General transfer comment: cues for hand placement and safety  Ambulation/Gait Ambulation/Gait assistance: Min guard Ambulation Distance (Feet): 180 Feet Assistive device: Rolling walker (2 wheeled) Gait Pattern/deviations: Step-through pattern;Decreased stride length;Trunk flexed   Gait velocity interpretation: Below normal speed for age/gender General Gait Details: cues for posture and position in RW without dyspnea   Stairs            Wheelchair Mobility    Modified Rankin (Stroke Patients Only)       Balance Overall balance assessment: Needs assistance   Sitting balance-Leahy Scale: Good        Standing balance-Leahy Scale: Poor                      Cognition Arousal/Alertness: Awake/alert Behavior During Therapy: WFL for tasks assessed/performed Overall Cognitive Status: Within Functional Limits for tasks assessed                      Exercises General Exercises - Lower Extremity Long Arc Quad: AROM;Seated;Both;20 reps Hip ABduction/ADduction: AROM;Seated;Both;20 reps Hip Flexion/Marching: AROM;Seated;Both;20 reps    General Comments        Pertinent Vitals/Pain Pain Assessment: No/denies pain    Home Living                      Prior Function            PT Goals (current goals can now be found in the care plan section) Progress towards PT goals: Progressing toward goals    Frequency       PT Plan Current plan remains appropriate    Co-evaluation             End of Session Equipment Utilized During Treatment: Gait belt Activity Tolerance: Patient tolerated treatment well Patient left: in chair;with call bell/phone within reach;with family/visitor present;with chair alarm set     Time: 9024-0973 PT Time Calculation (min): 28 min  Charges:  $Gait Training: 8-22 mins $Therapeutic Exercise: 8-22 mins  G CodesLanetta Inch Beth 01/16/2014, 9:09 AM Elwyn Reach, [place]

## 2013-12-21 NOTE — Discharge Summary (Signed)
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 There is a very tight window and too much one way or the other results in decompensation. Discussed aggressiveness of care at outset of this visit and he wants to continue aggressive approach including life support is needed.  Prognosis is poor and likelihood of recurrent admission is high. Ischemia is likely playing a role but at his age and with CKD, invasive approach is not prudent. Ready for discharge today with early OV and BMET this week as OP. Dr. Janice Norrie to comment on foley prior to discharge.

## 2013-12-21 NOTE — Progress Notes (Addendum)
Patient seen by his cardiologist [name] today. Deemed stable for discharge. Follow-up with urologist Dr. Janice Norrie and home on Demadex 80 mg BID. Check orthostatic BP prior to d/c per [name].  Pixie Casino, MD, [facility] Attending Cardiologist [place]

## 2013-12-21 NOTE — Telephone Encounter (Signed)
ERROR

## 2013-12-21 NOTE — Progress Notes (Signed)
Pt discharged home with family.  Reviewed discharge instructions and education, all questions answered.  Assessment unchanged from earlier.  

## 2013-12-21 NOTE — Progress Notes (Signed)
He feels better but had angina last night relieved by SL NTG.  There is also the isue of voiding. He had a foley in place on admission that has now been removed. Has voided inly 200 cc since removal. His Urologist, Dr. Janice Norrie will need to be contacted and advise about whether to leave catheter out or re-insert. I would discharge on demadex 80 mg BID. Needs early BMET prior to weeks end. Needs orthostatic BP prior to discharge.

## 2013-12-21 NOTE — Progress Notes (Signed)
Inpatient Diabetes Program Recommendations  AACE/ADA: New Consensus Statement on Inpatient Glycemic Control (2013)  Target Ranges:  Prepandial:   less than 140 mg/dL      Peak postprandial:   less than 180 mg/dL (1-2 hours)      Critically ill patients:  140 - 180 mg/dL     Results for [name], [name] (MRN [id number]) as of 12/21/2013 07:37  Ref. Range 12/20/2013 07:51 12/20/2013 11:33 12/20/2013 16:38 12/20/2013 20:56  Glucose-Capillary Latest Range: 70-99 mg/dL 128 (H) 301 (H) 152 (H) 249 (H)     Admitted with CP & Dyspnea.  History of DM, CAD, CKD 4, CHF.  Home DM Meds: Lantus 12 units QHS Novolog 2-9 units tid per SSI    **Note patient is having elevated postprandial CBGs.  Currently getting solid PO diet.   MD- Please consider the following:  1. Add Carbohydrate Modified to current diet order 2. Start low dose Novolog Meal Coverage- Novolog 3 units tid with meals (hold if pt eats <50%, hold if pt NPO)    Will follow Wyn Quaker RN, MSN, CDE Diabetes Coordinator Inpatient Diabetes Program Team Pager: [phone] (8a-10p)

## 2013-12-21 NOTE — Discharge Summary (Signed)
Physician Discharge Summary  Patient ID: [name] MRN: [id number] DOB/AGE: 78-Jan-1920 78 y.o.  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. He has a history of coronary artery disease, permanent pacemaker, paroxysmal atrial fibrillation, chronic kidney disease, and chronic diastolic heart failure. He also has a h/o urinary retention and has required treatement with urinary catheters. This is followed by Dr. Janice Norrie. Recently, the patient has been feeling poorly. [name] on 12/11/13. He presented to the [facility] ER on 12/18/13 with complaints of increasing dyspnea and nocturnal angina. Cardiac markers were minimally elevated.  However, given his age and advanced renal failure (Scr. ~2.4) no plans were made for cath. 500 mg of Ranexa BID was initiated for recurrent angina. He was continued on metoprolol and Imdur. Also notable was an elevated BNP of 3146, which was increased from 232, two weeks prior on 12/03/13. [name], but at a higher dose of 80 mg BID. In regards to his h/o urinary retention, it should be noted that he presented with a foley catheter in place, however this was removed during his admission. Urine output was monitored closely post foely removal. He denied any issues voiding and UO was measured at 875 cc post foley removal. Dr. Janice Norrie was contacted and report was given. He felt that recathertization was not indicated. He recommended outpatient follow-up, which is scheduled on 12/23/13. The patient was seen and examined by [name], who felt that he was stable for discharge home. He  recommened that a f/u BMET be arranged 3-4 days post discharge. The patient is schedule for f/u with [name] on 01/06/14.      Consults: None  Significant Diagnostic Studies: none  Treatments: See Hospital Course  Discharge Exam: Blood pressure 133/53, pulse 61, temperature 98.3 F (36.8 C), temperature source Oral, resp. rate 18, height 5\' 7"  (1.702 m), weight 195 lb 1.7 oz (88.5 kg), SpO2 100.00%.   Disposition: 01-Home or Self Care     Medication List         allopurinol 100 MG tablet  Commonly known as:  ZYLOPRIM  Take 200 mg by mouth daily.     amiodarone 200 MG tablet  Commonly known as:  PACERONE  Take 200 mg by mouth daily.     BL FLAX SEED OIL PO  Take 1 capsule by mouth 2 (two) times daily.     dutasteride 0.5 MG capsule  Commonly known as:  AVODART  Take 0.5 mg by mouth daily.     ELIQUIS 2.5 MG Tabs tablet  Generic drug:  apixaban  Take 2.5 mg by mouth 2 (two) times daily.     ferrous sulfate 325 (65 FE) MG tablet  Take 325 mg by mouth every other day.     fexofenadine 180 MG tablet  Commonly known as:  ALLEGRA  Take 180 mg by mouth daily.     hydrALAZINE 25 MG tablet  Commonly known as:  APRESOLINE  Take 25 mg by mouth every 12 (twelve) hours.     insulin glargine 100 UNIT/ML injection  Commonly known as:  LANTUS  Inject 12 Units into the skin at bedtime.     isosorbide mononitrate 60 MG 24 hr tablet  Commonly known as:  IMDUR  Take 1 tablet (60 mg total) by mouth daily.     metoprolol tartrate 25 MG tablet  Commonly known as:  LOPRESSOR  Take 1 tablet (25 mg total) by mouth daily.     multivitamin with minerals Tabs tablet  Take 1 tablet by mouth daily. Diabetic pak by Petra Kuba from [facility]     nitroGLYCERIN 0.4 MG SL tablet  Commonly known as:  NITROSTAT  Place 1 tablet (0.4 mg total) under the tongue every 5 (five) minutes as needed for chest pain.     NOVOLOG FLEXPEN 100 UNIT/ML FlexPen  Generic drug:  insulin aspart  Inject 2-9  Units into the skin 3 (three) times daily with meals. Sliding scale     polyethylene glycol packet  Commonly known as:  MIRALAX / GLYCOLAX  Take 17 g by mouth 2 (two) times daily as needed for mild constipation. For constipation     potassium chloride SA 20 MEQ tablet  Commonly known as:  K-DUR,KLOR-CON  Take 1 tablet (20 mEq total) by mouth daily.     ranolazine 500 MG 12 hr tablet  Commonly known as:  RANEXA  Take 1 tablet (500 mg total) by mouth 2 (two) times daily.     REFRESH OP  Place 1 drop into both eyes 2 (two) times daily. Refresh Gel Opth     silodosin 8 MG Caps capsule  Commonly known as:  RAPAFLO  Take 8 mg by mouth daily with breakfast.     SYSTANE 0.4-0.3 % Soln  Generic drug:  Polyethyl Glycol-Propyl Glycol  Place 1 drop into both eyes daily as needed (dry eyes). Into affected eyes     torsemide 20 MG tablet  Commonly known as:  DEMADEX  Take 4 tablets (80 mg total) by mouth 2 (two) times daily. Take 4 tablets in the morning and 2 tablets in the afternoon.     ZIOPTAN 0.0015 % Soln  Generic drug:  Tafluprost  Place 1 drop into both eyes at bedtime. Use at night       TIME SPENT ON DISCHARGE, INCLUDING PHYSICIAN TIME: >30 MINUTES  Signed: Lyda Jester 12/21/2013, 3:19 PM

## 2013-12-22 ENCOUNTER — Inpatient Hospital Stay (HOSPITAL_COMMUNITY)
Admission: EM | Admit: 2013-12-22 | Discharge: 2013-12-29 | DRG: 640 | Disposition: A | Discharge status: Rehabilitation Facility | Payer: Medicare Other | Attending: Internal Medicine | Admitting: Internal Medicine

## 2013-12-22 ENCOUNTER — Encounter (HOSPITAL_COMMUNITY): Payer: Self-pay | Admitting: Emergency Medicine

## 2013-12-22 ENCOUNTER — Emergency Department (HOSPITAL_COMMUNITY): Payer: Medicare Other

## 2013-12-22 DIAGNOSIS — I119 Hypertensive heart disease without heart failure: Secondary | ICD-10-CM | POA: Diagnosis present

## 2013-12-22 DIAGNOSIS — E119 Type 2 diabetes mellitus without complications: Secondary | ICD-10-CM | POA: Diagnosis present

## 2013-12-22 DIAGNOSIS — N183 Chronic kidney disease, stage 3 unspecified: Secondary | ICD-10-CM

## 2013-12-22 DIAGNOSIS — I509 Heart failure, unspecified: Secondary | ICD-10-CM | POA: Diagnosis present

## 2013-12-22 DIAGNOSIS — N184 Chronic kidney disease, stage 4 (severe): Secondary | ICD-10-CM

## 2013-12-22 DIAGNOSIS — N189 Chronic kidney disease, unspecified: Secondary | ICD-10-CM | POA: Diagnosis present

## 2013-12-22 DIAGNOSIS — Z87891 Personal history of nicotine dependence: Secondary | ICD-10-CM

## 2013-12-22 DIAGNOSIS — E860 Dehydration: Secondary | ICD-10-CM | POA: Diagnosis not present

## 2013-12-22 DIAGNOSIS — Z794 Long term (current) use of insulin: Secondary | ICD-10-CM

## 2013-12-22 DIAGNOSIS — Z888 Allergy status to other drugs, medicaments and biological substances status: Secondary | ICD-10-CM

## 2013-12-22 DIAGNOSIS — I2789 Other specified pulmonary heart diseases: Secondary | ICD-10-CM | POA: Diagnosis present

## 2013-12-22 DIAGNOSIS — I739 Peripheral vascular disease, unspecified: Secondary | ICD-10-CM | POA: Diagnosis present

## 2013-12-22 DIAGNOSIS — Z807 Family history of other malignant neoplasms of lymphoid, hematopoietic and related tissues: Secondary | ICD-10-CM

## 2013-12-22 DIAGNOSIS — Z96659 Presence of unspecified artificial knee joint: Secondary | ICD-10-CM

## 2013-12-22 DIAGNOSIS — Z8249 Family history of ischemic heart disease and other diseases of the circulatory system: Secondary | ICD-10-CM

## 2013-12-22 DIAGNOSIS — I5033 Acute on chronic diastolic (congestive) heart failure: Secondary | ICD-10-CM

## 2013-12-22 DIAGNOSIS — I5030 Unspecified diastolic (congestive) heart failure: Secondary | ICD-10-CM

## 2013-12-22 DIAGNOSIS — Z7901 Long term (current) use of anticoagulants: Secondary | ICD-10-CM

## 2013-12-22 DIAGNOSIS — I4891 Unspecified atrial fibrillation: Secondary | ICD-10-CM | POA: Diagnosis present

## 2013-12-22 DIAGNOSIS — I5032 Chronic diastolic (congestive) heart failure: Secondary | ICD-10-CM

## 2013-12-22 DIAGNOSIS — I5043 Acute on chronic combined systolic (congestive) and diastolic (congestive) heart failure: Secondary | ICD-10-CM

## 2013-12-22 DIAGNOSIS — I2510 Atherosclerotic heart disease of native coronary artery without angina pectoris: Secondary | ICD-10-CM | POA: Diagnosis present

## 2013-12-22 DIAGNOSIS — R911 Solitary pulmonary nodule: Secondary | ICD-10-CM | POA: Diagnosis present

## 2013-12-22 DIAGNOSIS — Z8673 Personal history of transient ischemic attack (TIA), and cerebral infarction without residual deficits: Secondary | ICD-10-CM

## 2013-12-22 DIAGNOSIS — R4182 Altered mental status, unspecified: Secondary | ICD-10-CM | POA: Diagnosis not present

## 2013-12-22 DIAGNOSIS — I10 Essential (primary) hypertension: Secondary | ICD-10-CM

## 2013-12-22 DIAGNOSIS — I480 Paroxysmal atrial fibrillation: Secondary | ICD-10-CM

## 2013-12-22 DIAGNOSIS — E785 Hyperlipidemia, unspecified: Secondary | ICD-10-CM | POA: Diagnosis present

## 2013-12-22 DIAGNOSIS — I129 Hypertensive chronic kidney disease with stage 1 through stage 4 chronic kidney disease, or unspecified chronic kidney disease: Secondary | ICD-10-CM | POA: Diagnosis present

## 2013-12-22 DIAGNOSIS — Z950 Presence of cardiac pacemaker: Secondary | ICD-10-CM

## 2013-12-22 DIAGNOSIS — M129 Arthropathy, unspecified: Secondary | ICD-10-CM | POA: Diagnosis present

## 2013-12-22 DIAGNOSIS — R339 Retention of urine, unspecified: Secondary | ICD-10-CM

## 2013-12-22 DIAGNOSIS — N179 Acute kidney failure, unspecified: Secondary | ICD-10-CM

## 2013-12-22 DIAGNOSIS — I209 Angina pectoris, unspecified: Secondary | ICD-10-CM | POA: Diagnosis present

## 2013-12-22 LAB — GLUCOSE, CAPILLARY: Glucose-Capillary: 234 mg/dL — ABNORMAL HIGH (ref 70–99)

## 2013-12-22 LAB — COMPREHENSIVE METABOLIC PANEL
ALT: 38 U/L (ref 0–53)
AST: 50 U/L — ABNORMAL HIGH (ref 0–37)
Albumin: 3.3 g/dL — ABNORMAL LOW (ref 3.5–5.2)
Alkaline Phosphatase: 64 U/L (ref 39–117)
Anion gap: 12 (ref 5–15)
BUN: 45 mg/dL — ABNORMAL HIGH (ref 6–23)
CALCIUM: 9.3 mg/dL (ref 8.4–10.5)
CO2: 29 meq/L (ref 19–32)
CREATININE: 2.63 mg/dL — AB (ref 0.50–1.35)
Chloride: 95 mEq/L — ABNORMAL LOW (ref 96–112)
GFR calc Af Amer: 23 mL/min — ABNORMAL LOW (ref >90)
GFR, EST NON AFRICAN AMERICAN: 19 mL/min — AB (ref >90)
Glucose, Bld: 210 mg/dL — ABNORMAL HIGH (ref 70–99)
Potassium: 4.9 mEq/L (ref 3.7–5.3)
SODIUM: 136 meq/L — AB (ref 137–147)
Total Bilirubin: 0.3 mg/dL (ref 0.3–1.2)
Total Protein: 7.1 g/dL (ref 6.0–8.3)

## 2013-12-22 LAB — CBC
HCT: 34.8 % — ABNORMAL LOW (ref 39.0–52.0)
Hemoglobin: 11.5 g/dL — ABNORMAL LOW (ref 13.0–17.0)
MCH: 33.3 pg (ref 26.0–34.0)
MCHC: 33 g/dL (ref 30.0–36.0)
MCV: 100.9 fL — AB (ref 78.0–100.0)
Platelets: 193 10*3/uL (ref 150–400)
RBC: 3.45 MIL/uL — ABNORMAL LOW (ref 4.22–5.81)
RDW: 15.3 % (ref 11.5–15.5)
WBC: 7.1 10*3/uL (ref 4.0–10.5)

## 2013-12-22 LAB — URINALYSIS, ROUTINE W REFLEX MICROSCOPIC
Bilirubin Urine: NEGATIVE
GLUCOSE, UA: NEGATIVE mg/dL
Hgb urine dipstick: NEGATIVE
KETONES UR: NEGATIVE mg/dL
LEUKOCYTES UA: NEGATIVE
NITRITE: NEGATIVE
PH: 6 (ref 5.0–8.0)
PROTEIN: NEGATIVE mg/dL
Specific Gravity, Urine: 1.014 (ref 1.005–1.030)
Urobilinogen, UA: 0.2 mg/dL (ref 0.0–1.0)

## 2013-12-22 LAB — I-STAT TROPONIN, ED: TROPONIN I, POC: 0.13 ng/mL — AB (ref 0.00–0.08)

## 2013-12-22 LAB — PRO B NATRIURETIC PEPTIDE: PRO B NATRI PEPTIDE: 3984 pg/mL — AB (ref 0–450)

## 2013-12-22 MED ORDER — FERROUS SULFATE 325 (65 FE) MG PO TABS
325.0000 mg | ORAL_TABLET | ORAL | Status: DC
  Administered 2013-12-24 – 2013-12-28 (×3): 325 mg via ORAL
  Filled 2013-12-22 (×3): qty 1

## 2013-12-22 MED ORDER — INSULIN GLARGINE 100 UNIT/ML ~~LOC~~ SOLN
12.0000 [IU] | Freq: Every day | SUBCUTANEOUS | Status: DC
  Administered 2013-12-22 – 2013-12-28 (×6): 12 [IU] via SUBCUTANEOUS
  Filled 2013-12-22 (×10): qty 0.12

## 2013-12-22 MED ORDER — INSULIN ASPART 100 UNIT/ML ~~LOC~~ SOLN
2.0000 [IU] | Freq: Every day | SUBCUTANEOUS | Status: DC
  Administered 2013-12-23: 3 [IU] via SUBCUTANEOUS
  Administered 2013-12-24: 4 [IU] via SUBCUTANEOUS

## 2013-12-22 MED ORDER — AMIODARONE HCL 200 MG PO TABS
200.0000 mg | ORAL_TABLET | Freq: Every day | ORAL | Status: DC
  Administered 2013-12-23: 200 mg via ORAL
  Filled 2013-12-22: qty 1

## 2013-12-22 MED ORDER — INSULIN ASPART 100 UNIT/ML ~~LOC~~ SOLN
9.0000 [IU] | Freq: Two times a day (BID) | SUBCUTANEOUS | Status: DC
  Administered 2013-12-23 (×2): 9 [IU] via SUBCUTANEOUS

## 2013-12-22 MED ORDER — LORATADINE 10 MG PO TABS
10.0000 mg | ORAL_TABLET | Freq: Every day | ORAL | Status: DC
  Administered 2013-12-23 – 2013-12-29 (×7): 10 mg via ORAL
  Filled 2013-12-22 (×7): qty 1

## 2013-12-22 MED ORDER — ISOSORBIDE MONONITRATE ER 60 MG PO TB24
60.0000 mg | ORAL_TABLET | Freq: Every day | ORAL | Status: DC
  Administered 2013-12-23: 60 mg via ORAL
  Filled 2013-12-22: qty 1

## 2013-12-22 MED ORDER — METOPROLOL TARTRATE 25 MG PO TABS
25.0000 mg | ORAL_TABLET | Freq: Every day | ORAL | Status: DC
  Administered 2013-12-23 – 2013-12-29 (×7): 25 mg via ORAL
  Filled 2013-12-22 (×7): qty 1

## 2013-12-22 MED ORDER — TAMSULOSIN HCL 0.4 MG PO CAPS
0.4000 mg | ORAL_CAPSULE | Freq: Every day | ORAL | Status: DC
  Filled 2013-12-22 (×2): qty 1

## 2013-12-22 MED ORDER — LATANOPROST 0.005 % OP SOLN
1.0000 [drp] | Freq: Every day | OPHTHALMIC | Status: DC
  Administered 2013-12-26 – 2013-12-27 (×2): 1 [drp] via OPHTHALMIC
  Filled 2013-12-22 (×2): qty 2.5

## 2013-12-22 MED ORDER — NITROGLYCERIN 0.4 MG SL SUBL
0.4000 mg | SUBLINGUAL_TABLET | SUBLINGUAL | Status: DC | PRN
  Administered 2013-12-23: 0.4 mg via SUBLINGUAL
  Filled 2013-12-22: qty 1

## 2013-12-22 MED ORDER — POTASSIUM CHLORIDE CRYS ER 20 MEQ PO TBCR
20.0000 meq | EXTENDED_RELEASE_TABLET | Freq: Every day | ORAL | Status: DC
  Administered 2013-12-23 – 2013-12-29 (×7): 20 meq via ORAL
  Filled 2013-12-22 (×8): qty 1

## 2013-12-22 MED ORDER — POLYETHYL GLYCOL-PROPYL GLYCOL 0.4-0.3 % OP SOLN
1.0000 [drp] | Freq: Every day | OPHTHALMIC | Status: DC | PRN
  Filled 2013-12-22: qty 15

## 2013-12-22 MED ORDER — RANOLAZINE ER 500 MG PO TB12
500.0000 mg | ORAL_TABLET | Freq: Two times a day (BID) | ORAL | Status: DC
  Administered 2013-12-23: 500 mg via ORAL
  Filled 2013-12-22 (×2): qty 1

## 2013-12-22 MED ORDER — INSULIN ASPART 100 UNIT/ML FLEXPEN
2.0000 [IU] | PEN_INJECTOR | Freq: Three times a day (TID) | SUBCUTANEOUS | Status: DC
  Filled 2013-12-22: qty 3

## 2013-12-22 MED ORDER — ALLOPURINOL 100 MG PO TABS
200.0000 mg | ORAL_TABLET | Freq: Every day | ORAL | Status: DC
  Administered 2013-12-23 – 2013-12-29 (×7): 200 mg via ORAL
  Filled 2013-12-22 (×7): qty 2

## 2013-12-22 MED ORDER — SODIUM CHLORIDE 0.45 % IV BOLUS
1000.0000 mL | Freq: Once | INTRAVENOUS | Status: AC
  Administered 2013-12-22: 1000 mL via INTRAVENOUS

## 2013-12-22 MED ORDER — ADULT MULTIVITAMIN W/MINERALS CH
1.0000 | ORAL_TABLET | Freq: Every day | ORAL | Status: DC
  Administered 2013-12-23 – 2013-12-29 (×7): 1 via ORAL
  Filled 2013-12-22 (×7): qty 1

## 2013-12-22 MED ORDER — POLYVINYL ALCOHOL 1.4 % OP SOLN
1.0000 [drp] | OPHTHALMIC | Status: DC | PRN
  Filled 2013-12-22: qty 15

## 2013-12-22 MED ORDER — HYDRALAZINE HCL 25 MG PO TABS
25.0000 mg | ORAL_TABLET | Freq: Two times a day (BID) | ORAL | Status: DC
  Administered 2013-12-22 – 2013-12-23 (×3): 25 mg via ORAL
  Filled 2013-12-22 (×5): qty 1

## 2013-12-22 MED ORDER — DUTASTERIDE 0.5 MG PO CAPS
0.5000 mg | ORAL_CAPSULE | Freq: Every day | ORAL | Status: DC
  Administered 2013-12-23: 0.5 mg via ORAL
  Filled 2013-12-22: qty 1

## 2013-12-22 MED ORDER — SODIUM CHLORIDE 0.9 % IJ SOLN
3.0000 mL | Freq: Two times a day (BID) | INTRAMUSCULAR | Status: DC
  Administered 2013-12-23 – 2013-12-29 (×13): 3 mL via INTRAVENOUS

## 2013-12-22 MED ORDER — APIXABAN 2.5 MG PO TABS
2.5000 mg | ORAL_TABLET | Freq: Two times a day (BID) | ORAL | Status: DC
  Administered 2013-12-22 – 2013-12-29 (×14): 2.5 mg via ORAL
  Filled 2013-12-22 (×16): qty 1

## 2013-12-22 MED ORDER — POLYETHYLENE GLYCOL 3350 17 G PO PACK
17.0000 g | PACK | Freq: Two times a day (BID) | ORAL | Status: DC | PRN
  Administered 2013-12-22 – 2013-12-28 (×8): 17 g via ORAL
  Filled 2013-12-22 (×8): qty 1

## 2013-12-22 NOTE — ED Notes (Signed)
Per EMS pt seen here last week for CHF exacerbation here for 3 days pt noticed increased weakness in legs. Pt had PT in hospital but was discharged. Pt has noted increased lower extremity weakness and new upper extremity weakness. Pt has had near falls but able to catch himself. No neuro deficits.

## 2013-12-22 NOTE — ED Provider Notes (Signed)
CSN: [id number]     Arrival date & time 12/22/13  1350 History   First MD Initiated Contact with Patient 12/22/13 1351     Chief Complaint  Patient presents with  . Weakness     (Consider location/radiation/quality/duration/timing/severity/associated sxs/prior Treatment) HPI Comments: Knees gave way during PT.  Patient is a 80 y.o. male presenting with weakness. The history is provided by the patient.  Weakness This is a new problem. The current episode started yesterday. The problem occurs constantly. The problem has not changed since onset.Pertinent negatives include no chest pain, no abdominal pain and no shortness of breath. Nothing aggravates the symptoms. Nothing relieves the symptoms. He has tried nothing for the symptoms.    Past Medical History  Diagnosis Date  . Diabetes mellitus   . Hyperlipidemia   . Coronary artery disease   . Spinal stenosis   . Pneumonia   . Pacemaker   . Anemia   . Carotid artery disease   . PVD (peripheral vascular disease)   . Hyponatremia     Chronic  . CVA (cerebral infarction)   . CKD (chronic kidney disease) stage 4, GFR 15-29 ml/min   . Long term (current) use of anticoagulants     No bleeding on Eliquis  . Dysrhythmia   . PSVT (paroxysmal supraventricular tachycardia)   . PAF (paroxysmal atrial fibrillation)     Asymptomatic. On Eliquis.  . AV block, 3rd degree     With DDD St. Jude PM, initially placed in 1993 by Dr. Nils Pyle. Device upgrade 10/2002 to DDD, by Dr. Rollene Fare complicated by bleeding.  . Essential hypertension, benign   . CHF (congestive heart failure)   . Chronic diastolic congestive heart failure     ECHO 05/20/12 LVEF estimated by 2D at 55-60%  . Arthritis   . Shortness of breath    Past Surgical History  Procedure Laterality Date  . Lumbar laminectomy  1995  . Back surgery    . Total knee arthroplasty Left   . Quadriceps tendon repair    . Cataract extraction    . Carotid endarterectomy    . Yag laser  application Right 08/20/8117    Procedure: YAG LASER CAPSULOTOMY OF RIGHT EYE;  Surgeon: [name], MD;  Location: [place] [facility];  Service: Ophthalmology;  Laterality: Right;  . Tonsillectomy    . Insert / replace / remove pacemaker      DDD St. Jude PM, initially placed in 1993 by Dr. Nils Pyle. Device upgrade 10/2002 to DDD, by Dr. Rollene Fare complicated by bleeding.  . Carotid endarterectomy Right 2006  . Transurethral resection of prostate    . Cardioversion N/A 06/16/2013    Procedure: CARDIOVERSION BEDSIDE;  Surgeon: [name], MD;  Location: [facility] OR;  Service: Cardiovascular;  Laterality: N/A;   Family History  Problem Relation Age of Onset  . Multiple myeloma Father   . Hypertension Mother    History  Substance Use Topics  . Smoking status: Former Smoker -- 2 years    Types: Pipe    Quit date: 05/14/1958  . Smokeless tobacco: Never Used  . Alcohol Use: No     Comment: Cocktails occasionally    Review of Systems  Constitutional: Negative for fever.  Respiratory: Negative for cough and shortness of breath.   Cardiovascular: Negative for chest pain.  Gastrointestinal: Negative for vomiting and abdominal pain.  Neurological: Positive for weakness.  All other systems reviewed and are negative.     Allergies  Aggrenox;  Carvedilol; Claritin; Mucinex; Plavix; Rapaflo; Statins; and Travatan z  Home Medications   Prior to Admission medications   Medication Sig Start Date End Date Taking? Authorizing Provider  allopurinol (ZYLOPRIM) 100 MG tablet Take 200 mg by mouth daily.     Historical Provider, MD  amiodarone (PACERONE) 200 MG tablet Take 200 mg by mouth daily. 06/22/13   Belva Crome III, MD  apixaban (ELIQUIS) 2.5 MG TABS tablet Take 2.5 mg by mouth 2 (two) times daily.    Historical Provider, MD  dutasteride (AVODART) 0.5 MG capsule Take 0.5 mg by mouth daily.     Historical Provider, MD  ferrous sulfate 325 (65 FE) MG tablet Take 325 mg by mouth every other  day.     Historical Provider, MD  fexofenadine (ALLEGRA) 180 MG tablet Take 180 mg by mouth daily.    Historical Provider, MD  Flaxseed, Linseed, (BL FLAX SEED OIL PO) Take 1 capsule by mouth 2 (two) times daily.    Historical Provider, MD  hydrALAZINE (APRESOLINE) 25 MG tablet Take 25 mg by mouth every 12 (twelve) hours.     Historical Provider, MD  insulin aspart (NOVOLOG FLEXPEN) 100 UNIT/ML FlexPen Inject 2-9 Units into the skin 3 (three) times daily with meals. Sliding scale    Historical Provider, MD  insulin glargine (LANTUS) 100 UNIT/ML injection Inject 12 Units into the skin at bedtime.    Historical Provider, MD  isosorbide mononitrate (IMDUR) 60 MG 24 hr tablet Take 1 tablet (60 mg total) by mouth daily. 12/17/13   Belva Crome III, MD  metoprolol tartrate (LOPRESSOR) 25 MG tablet Take 1 tablet (25 mg total) by mouth daily. 11/12/13   Belva Crome III, MD  Multiple Vitamin (MULTIVITAMIN WITH MINERALS) TABS Take 1 tablet by mouth daily. Diabetic pak by Petra Kuba from [facility] Provider, MD  nitroGLYCERIN (NITROSTAT) 0.4 MG SL tablet Place 1 tablet (0.4 mg total) under the tongue every 5 (five) minutes as needed for chest pain. 12/03/13   [name], MD  Polyethyl Glycol-Propyl Glycol (SYSTANE) 0.4-0.3 % SOLN Place 1 drop into both eyes daily as needed (dry eyes). Into affected eyes    Historical Provider, MD  polyethylene glycol (MIRALAX / GLYCOLAX) packet Take 17 g by mouth 2 (two) times daily as needed for mild constipation. For constipation    Historical Provider, MD  Polyvinyl Alcohol-Povidone (REFRESH OP) Place 1 drop into both eyes 2 (two) times daily. Refresh Gel Opth    Historical Provider, MD  potassium chloride SA (K-DUR,KLOR-CON) 20 MEQ tablet Take 1 tablet (20 mEq total) by mouth daily. 12/08/13   Darlin Coco, MD  ranolazine (RANEXA) 500 MG 12 hr tablet Take 1 tablet (500 mg total) by mouth 2 (two) times daily. 12/21/13   Brittainy Simmons, PA-C  silodosin  (RAPAFLO) 8 MG CAPS capsule Take 8 mg by mouth daily with breakfast.    Historical Provider, MD  Tafluprost (ZIOPTAN) 0.0015 % SOLN Place 1 drop into both eyes at bedtime. Use at night    Historical Provider, MD  torsemide (DEMADEX) 20 MG tablet Take 4 tablets (80 mg total) by mouth 2 (two) times daily. Take 4 tablets in the morning and 2 tablets in the afternoon. 12/21/13   Brittainy Simmons, PA-C   BP 129/70  Pulse 60  Temp(Src) 98 F (36.7 C) (Oral)  Resp 17  Ht '5\' 7"'  (1.702 m)  Wt 193 lb 8 oz (87.771 kg)  BMI 30.30 kg/m2  SpO2 95% Physical Exam  Nursing note and vitals reviewed. Constitutional: He is oriented to person, place, and time. He appears well-developed and well-nourished. No distress.  HENT:  Head: Normocephalic and atraumatic.  Mouth/Throat: Oropharynx is clear and moist. No oropharyngeal exudate.  Eyes: EOM are normal. Pupils are equal, round, and reactive to light.  Neck: Normal range of motion. Neck supple.  Cardiovascular: Normal rate and regular rhythm.  Exam reveals no friction rub.   No murmur heard. Pulmonary/Chest: Effort normal and breath sounds normal. No respiratory distress. He has no wheezes. He has no rales. He exhibits no tenderness.  Abdominal: Soft. He exhibits no distension. There is no tenderness. There is no rebound.  Musculoskeletal: Normal range of motion. He exhibits no edema.  Neurological: He is alert and oriented to person, place, and time.  Skin: No rash noted. He is not diaphoretic.    ED Course  Procedures (including critical care time) Labs Review Labs Reviewed - No data to display  Imaging Review Dg Chest 2 View  12/22/2013   CLINICAL DATA:  Weakness.  EXAM: CHEST  2 VIEW  COMPARISON:  CT chest 09/05/2013. Plain film of the chest 12/18/2013, 11/20/2013 and 09/05/2013.  FINDINGS: Pacing device remains in place. There are small bilateral pleural effusions. Heart size is enlarged and there is interstitial edema. No pneumothorax  identified.  IMPRESSION: Cardiomegaly and mild interstitial edema with associated small pleural effusions.   Electronically Signed   By: Inge Rise M.D.   On: 12/22/2013 15:59     EKG Interpretation None      Date: 12/22/2013  Rate: 60  Rhythm: paced  QRS Axis: normal  Intervals: normal  ST/T Wave abnormalities: normal  Conduction Disutrbances:none  Narrative Interpretation:   Old EKG Reviewed: unchanged   MDM   Final diagnoses:  Dehydration    26M here with weakness. Recently here for CHF exacerbation. Was in PT, legs began getting weak. Denies CP, SOB, N/V/D. No fevers. No syncope. Here with stable vitals. Exam benign. EKG ok. Will check labs. Labs show mildly elevated BNP. Patient seen by Cards - they feel he is mildly dehydrated and will admit.  Evelina Bucy, MD 12/23/13 1626

## 2013-12-22 NOTE — Progress Notes (Signed)
Admitted pt from ED AAOx3. Assisted to bed. VS taken and recorded. Oriented to call bell and room. Tele on.

## 2013-12-22 NOTE — ED Notes (Signed)
Mingo Amber, MD notified of abnormal lab results

## 2013-12-22 NOTE — H&P (Signed)
ADMISSION HISTORY & PHYSICAL   Chief Complaint:  Weakness  Cardiologist: [name]  Primary Care Physician: [name], MD  HPI:  This is a 80 y.o. male with a past medical history significant of coronary artery disease, permanent pacemaker, paroxysmal atrial fibrillation, chronic kidney disease, and chronic diastolic heart failure. He also has a h/o urinary retention and has required treatement with urinary catheters. This is followed by Dr. Janice Norrie. Recently, the patient has been feeling poorly. [name] on 12/11/13. He presented to the [facility] ER on 12/18/13 with complaints of increasing dyspnea and nocturnal angina. Cardiac markers were minimally elevated. However, given his age and advanced renal failure (Scr. ~2.4) no plans were made for cath. 500 mg of Ranexa BID was initiated for recurrent angina. He was continued on metoprolol and Imdur. Also notable was an elevated BNP of 3146, which was increased from 232, two weeks prior on 12/03/13. [name], but at a higher dose of 80 mg BID. In regards to his h/o urinary retention, it should be noted that he presented with a foley catheter in place, however this was removed during his admission. Urine output was monitored closely post foely removal. He denied any issues voiding and UO was measured at 875 cc post foley removal. Dr. Janice Norrie was contacted and report was given. He felt that recathertization was not indicated. He recommended outpatient follow-up, which is scheduled on 12/23/13. The patient was seen and examined by [name], who felt that he was stable for discharge home on 80 mg BID of demedex.  [name]. This was due to loss of power in the legs, not syncope or a mechanical fall. He feels generalized weakness and thinks he may be dehydrated.  Labs indicate a higher creatinine and slightly low  sodium and chloride which may be consistent with this. He denies chest pain or worsening dyspnea.   PMHx:  Past Medical History  Diagnosis Date  . Diabetes mellitus   . Hyperlipidemia   . Coronary artery disease   . Spinal stenosis   . Pneumonia   . Pacemaker   . Anemia   . Carotid artery disease   . PVD (peripheral vascular disease)   . Hyponatremia     Chronic  . CVA (cerebral infarction)   . CKD (chronic kidney disease) stage 4, GFR 15-29 ml/min   . Long term (current) use of anticoagulants     No bleeding on Eliquis  . Dysrhythmia   . PSVT (paroxysmal supraventricular tachycardia)   . PAF (paroxysmal atrial fibrillation)     Asymptomatic. On Eliquis.  . AV block, 3rd degree     With DDD St. Jude PM, initially placed in 1993 by Dr. Nils Pyle. Device upgrade 10/2002 to DDD, by Dr. Rollene Fare complicated by bleeding.  . Essential hypertension, benign   . CHF (congestive heart failure)   . Chronic diastolic congestive heart failure     ECHO 05/20/12 LVEF estimated by 2D at 55-60%  . Arthritis   . Shortness of breath     Past Surgical History  Procedure Laterality Date  . Lumbar laminectomy  1995  . Back surgery    . Total knee arthroplasty Left   . Quadriceps tendon repair    . Cataract extraction    . Carotid endarterectomy    . Yag laser  application Right 5/92/9244    Procedure: YAG LASER CAPSULOTOMY OF RIGHT EYE;  Surgeon: [name], MD;  Location: [facility];  Service: Ophthalmology;  Laterality: Right;  . Tonsillectomy    . Insert / replace / remove pacemaker      DDD St. Jude PM, initially placed in 1993 by Dr. Nils Pyle. Device upgrade 10/2002 to DDD, by Dr. Rollene Fare complicated by bleeding.  . Carotid endarterectomy Right 2006  . Transurethral resection of prostate    . Cardioversion N/A 06/16/2013    Procedure: CARDIOVERSION BEDSIDE;  Surgeon: [name], MD;  Location: [facility] OR;  Service: Cardiovascular;  Laterality: N/A;    FAMHx:  Family History    Problem Relation Age of Onset  . Multiple myeloma Father   . Hypertension Mother     SOCHx:   reports that he quit smoking about 55 years ago. His smoking use included Pipe. He has never used smokeless tobacco. He reports that he does not drink alcohol or use illicit drugs.  ALLERGIES:  Allergies  Allergen Reactions  . Aggrenox [Aspirin-Dipyridamole Er] Other (See Comments)    Dizziness  . Carvedilol Other (See Comments)    Dizziness  . Claritin [Loratadine] Other (See Comments)    Dizziness & drowsiness   . Mucinex [Guaifenesin Er] Other (See Comments)    Dizziness, drowsiness  . Plavix [Clopidogrel Bisulfate] Other (See Comments)    Dizziness  . Rapaflo [Silodosin] Other (See Comments)    Dizziness for about 30- 45  minutes  . Statins Other (See Comments)    rhabdomyolisis  . Travatan Z [Travoprost] Other (See Comments)    Eye irritation    ROS: A comprehensive review of systems was negative except for: Constitutional: positive for fatigue Musculoskeletal: positive for muscle weakness Neurological: positive for gait problems and fall  HOME MEDS:   Medication List    ASK your doctor about these medications       allopurinol 100 MG tablet  Commonly known as:  ZYLOPRIM  Take 200 mg by mouth daily.     amiodarone 200 MG tablet  Commonly known as:  PACERONE  Take 200 mg by mouth daily.     BL FLAX SEED OIL PO  Take 1 capsule by mouth 2 (two) times daily.     dutasteride 0.5 MG capsule  Commonly known as:  AVODART  Take 0.5 mg by mouth daily.     ELIQUIS 2.5 MG Tabs tablet  Generic drug:  apixaban  Take 2.5 mg by mouth 2 (two) times daily.     ferrous sulfate 325 (65 FE) MG tablet  Take 325 mg by mouth every other day.     fexofenadine 180 MG tablet  Commonly known as:  ALLEGRA  Take 180 mg by mouth daily.     hydrALAZINE 25 MG tablet  Commonly known as:  APRESOLINE  Take 25 mg by mouth every 12 (twelve) hours.     insulin glargine 100 UNIT/ML  injection  Commonly known as:  LANTUS  Inject 12 Units into the skin at bedtime.     isosorbide mononitrate 60 MG 24 hr tablet  Commonly known as:  IMDUR  Take 1 tablet (60 mg total) by mouth daily.     metoprolol tartrate 25 MG tablet  Commonly known as:  LOPRESSOR  Take 1 tablet (25 mg total) by mouth daily.     multivitamin with minerals Tabs tablet  Take 1 tablet by mouth daily. Diabetic pak by Petra Kuba from  [employer]     nitroGLYCERIN 0.4 MG SL tablet  Commonly known as:  NITROSTAT  Place 1 tablet (0.4 mg total) under the tongue every 5 (five) minutes as needed for chest pain.     NOVOLOG FLEXPEN 100 UNIT/ML FlexPen  Generic drug:  insulin aspart  Inject 2-9 Units into the skin 3 (three) times daily with meals. Sliding scale     polyethylene glycol packet  Commonly known as:  MIRALAX / GLYCOLAX  Take 17 g by mouth 2 (two) times daily as needed for mild constipation. For constipation     potassium chloride SA 20 MEQ tablet  Commonly known as:  K-DUR,KLOR-CON  Take 1 tablet (20 mEq total) by mouth daily.     ranolazine 500 MG 12 hr tablet  Commonly known as:  RANEXA  Take 1 tablet (500 mg total) by mouth 2 (two) times daily.     REFRESH OP  Place 1 drop into both eyes 2 (two) times daily. Refresh Gel Opth     silodosin 8 MG Caps capsule  Commonly known as:  RAPAFLO  Take 8 mg by mouth daily with breakfast.     SYSTANE 0.4-0.3 % Soln  Generic drug:  Polyethyl Glycol-Propyl Glycol  Place 1 drop into both eyes daily as needed (dry eyes). Into affected eyes     torsemide 20 MG tablet  Commonly known as:  DEMADEX  Take 4 tablets (80 mg total) by mouth 2 (two) times daily. Take 4 tablets in the morning and 2 tablets in the afternoon.     ZIOPTAN 0.0015 % Soln  Generic drug:  Tafluprost  Place 1 drop into both eyes at bedtime. Use at night       LABS/IMAGING: Results for orders placed during the hospital encounter of 12/22/13 (from the past 48 hour(s))  CBC      Status: Abnormal   Collection Time    12/22/13  3:04 PM      Result Value Ref Range   WBC 7.1  4.0 - 10.5 K/uL   RBC 3.45 (*) 4.22 - 5.81 MIL/uL   Hemoglobin 11.5 (*) 13.0 - 17.0 g/dL   HCT 34.8 (*) 39.0 - 52.0 %   MCV 100.9 (*) 78.0 - 100.0 fL   MCH 33.3  26.0 - 34.0 pg   MCHC 33.0  30.0 - 36.0 g/dL   RDW 15.3  11.5 - 15.5 %   Platelets 193  150 - 400 K/uL  COMPREHENSIVE METABOLIC PANEL     Status: Abnormal   Collection Time    12/22/13  3:04 PM      Result Value Ref Range   Sodium 136 (*) 137 - 147 mEq/L   Potassium 4.9  3.7 - 5.3 mEq/L   Chloride 95 (*) 96 - 112 mEq/L   CO2 29  19 - 32 mEq/L   Glucose, Bld 210 (*) 70 - 99 mg/dL   BUN 45 (*) 6 - 23 mg/dL   Creatinine, Ser 2.63 (*) 0.50 - 1.35 mg/dL   Calcium 9.3  8.4 - 10.5 mg/dL   Total Protein 7.1  6.0 - 8.3 g/dL   Albumin 3.3 (*) 3.5 - 5.2 g/dL   AST 50 (*) 0 - 37 U/L   Comment: HEMOLYSIS AT THIS LEVEL MAY AFFECT RESULT   ALT 38  0 - 53 U/L   Alkaline Phosphatase 64  39 - 117 U/L   Total Bilirubin 0.3  0.3 - 1.2 mg/dL   GFR calc non Af Wyvonnia Lora  19 (*) >90 mL/min   GFR calc Af Amer 23 (*) >90 mL/min   Comment: (NOTE)     The eGFR has been calculated using the CKD EPI equation.     This calculation has not been validated in all clinical situations.     eGFR's persistently <90 mL/min signify possible Chronic Kidney     Disease.   Anion gap 12  5 - 15  PRO B NATRIURETIC PEPTIDE     Status: Abnormal   Collection Time    12/22/13  3:04 PM      Result Value Ref Range   Pro B Natriuretic peptide (BNP) 3984.0 (*) 0 - 450 pg/mL  URINALYSIS, ROUTINE W REFLEX MICROSCOPIC     Status: None   Collection Time    12/22/13  3:27 PM      Result Value Ref Range   Color, Urine YELLOW  YELLOW   APPearance CLEAR  CLEAR   Specific Gravity, Urine 1.014  1.005 - 1.030   pH 6.0  5.0 - 8.0   Glucose, UA NEGATIVE  NEGATIVE mg/dL   Hgb urine dipstick NEGATIVE  NEGATIVE   Bilirubin Urine NEGATIVE  NEGATIVE   Ketones, ur NEGATIVE  NEGATIVE  mg/dL   Protein, ur NEGATIVE  NEGATIVE mg/dL   Urobilinogen, UA 0.2  0.0 - 1.0 mg/dL   Nitrite NEGATIVE  NEGATIVE   Leukocytes, UA NEGATIVE  NEGATIVE   Comment: MICROSCOPIC NOT DONE ON URINES WITH NEGATIVE PROTEIN, BLOOD, LEUKOCYTES, NITRITE, OR GLUCOSE <1000 mg/dL.  Randolm Idol, ED     Status: Abnormal   Collection Time    12/22/13  3:41 PM      Result Value Ref Range   Troponin i, poc 0.13 (*) 0.00 - 0.08 ng/mL   Comment NOTIFIED PHYSICIAN     Comment 3            Comment: Due to the release kinetics of cTnI,     a negative result within the first hours     of the onset of symptoms does not rule out     myocardial infarction with certainty.     If myocardial infarction is still suspected,     repeat the test at appropriate intervals.   Dg Chest 2 View  12/22/2013   CLINICAL DATA:  Weakness.  EXAM: CHEST  2 VIEW  COMPARISON:  CT chest 09/05/2013. Plain film of the chest 12/18/2013, 11/20/2013 and 09/05/2013.  FINDINGS: Pacing device remains in place. There are small bilateral pleural effusions. Heart size is enlarged and there is interstitial edema. No pneumothorax identified.  IMPRESSION: Cardiomegaly and mild interstitial edema with associated small pleural effusions.   Electronically Signed   By: Inge Rise M.D.   On: 12/22/2013 15:59    VITALS: Filed Vitals:   12/22/13 1730  BP: 121/66  Pulse: 63  Temp:   Resp: 13    EXAM: General appearance: alert, appears stated age and no distress Neck: no carotid bruit, no JVD and dry mucous membranes Lungs: diminished breath sounds bibasilar Heart: regular rate and rhythm, S1, S2 normal and systolic murmur: early systolic 3/6, crescendo at lower left sternal border Abdomen: soft, non-tender; bowel sounds normal; no masses,  no organomegaly Extremities: extremities normal, atraumatic, no cyanosis or edema Pulses: 2+ and symmetric Skin: Skin color, texture, turgor normal. No rashes or lesions Neurologic: Mental status:  Alert, oriented, thought content appropriate Psych: Pleasant  IMPRESSION: Principal Problem:   Dehydration Active Problems:   Chronic diastolic CHF (congestive  heart failure)   Hypertension   Paroxysmal atrial fibrillation, DCCV 06/16/13   Pacemaker - St Jude   Urinary retention, now with foley cath and leg bag.   CKD (chronic kidney disease) stage 4, GFR 15-29 ml/min   PLAN: 1. Dr. Kennon Holter is clinically dehydrated, I suspect from a narrow window between diastolic congestive heart failure and dehydration. Labs and exam support this. I would recommend holding his diuretics for now and giving him back about 1L of fluid over the next 12 hours. If he is feeling better and his labs improve, would then reduce the dose of his home diuretics.  Discussed with his cardiologist [name] and he agrees.  Pixie Casino, MD, [facility] Attending Cardiologist [facility]  Ashton Belote C 12/22/2013, 6:07 PM

## 2013-12-22 NOTE — ED Notes (Signed)
I gave the patient 2 packs of graham crackers, a container of peanut butter, and half a cup of ice water.

## 2013-12-23 ENCOUNTER — Observation Stay (HOSPITAL_COMMUNITY): Payer: Medicare Other

## 2013-12-23 DIAGNOSIS — I509 Heart failure, unspecified: Secondary | ICD-10-CM

## 2013-12-23 DIAGNOSIS — I5033 Acute on chronic diastolic (congestive) heart failure: Secondary | ICD-10-CM

## 2013-12-23 DIAGNOSIS — N189 Chronic kidney disease, unspecified: Secondary | ICD-10-CM

## 2013-12-23 DIAGNOSIS — I059 Rheumatic mitral valve disease, unspecified: Secondary | ICD-10-CM

## 2013-12-23 DIAGNOSIS — N179 Acute kidney failure, unspecified: Secondary | ICD-10-CM

## 2013-12-23 LAB — TROPONIN I: Troponin I: <0.3 ng/mL (ref <0.30)

## 2013-12-23 LAB — URINALYSIS, ROUTINE W REFLEX MICROSCOPIC
BILIRUBIN URINE: NEGATIVE
GLUCOSE, UA: NEGATIVE mg/dL
Hgb urine dipstick: NEGATIVE
KETONES UR: NEGATIVE mg/dL
Leukocytes, UA: NEGATIVE
Nitrite: NEGATIVE
PROTEIN: NEGATIVE mg/dL
Specific Gravity, Urine: 1.014 (ref 1.005–1.030)
Urobilinogen, UA: 0.2 mg/dL (ref 0.0–1.0)
pH: 6.5 (ref 5.0–8.0)

## 2013-12-23 LAB — PRO B NATRIURETIC PEPTIDE: Pro B Natriuretic peptide (BNP): 4564 pg/mL — ABNORMAL HIGH (ref 0–450)

## 2013-12-23 LAB — GLUCOSE, CAPILLARY
GLUCOSE-CAPILLARY: 230 mg/dL — AB (ref 70–99)
GLUCOSE-CAPILLARY: 237 mg/dL — AB (ref 70–99)
GLUCOSE-CAPILLARY: 178 mg/dL — AB (ref 70–99)
Glucose-Capillary: 59 mg/dL — ABNORMAL LOW (ref 70–99)

## 2013-12-23 LAB — COMPREHENSIVE METABOLIC PANEL
ALBUMIN: 3.4 g/dL — AB (ref 3.5–5.2)
ALT: 42 U/L (ref 0–53)
AST: 48 U/L — AB (ref 0–37)
Alkaline Phosphatase: 66 U/L (ref 39–117)
Anion gap: 16 — ABNORMAL HIGH (ref 5–15)
BILIRUBIN TOTAL: 0.4 mg/dL (ref 0.3–1.2)
BUN: 45 mg/dL — AB (ref 6–23)
CHLORIDE: 93 meq/L — AB (ref 96–112)
CO2: 26 mEq/L (ref 19–32)
Calcium: 9.3 mg/dL (ref 8.4–10.5)
Creatinine, Ser: 2.59 mg/dL — ABNORMAL HIGH (ref 0.50–1.35)
GFR calc Af Amer: 23 mL/min — ABNORMAL LOW (ref >90)
GFR calc non Af Amer: 20 mL/min — ABNORMAL LOW (ref >90)
Glucose, Bld: 226 mg/dL — ABNORMAL HIGH (ref 70–99)
Potassium: 4.5 mEq/L (ref 3.7–5.3)
Sodium: 135 mEq/L — ABNORMAL LOW (ref 137–147)
Total Protein: 7.2 g/dL (ref 6.0–8.3)

## 2013-12-23 LAB — MRSA PCR SCREENING: MRSA BY PCR: NEGATIVE

## 2013-12-23 MED ORDER — FUROSEMIDE 10 MG/ML IJ SOLN
80.0000 mg | Freq: Four times a day (QID) | INTRAMUSCULAR | Status: AC
  Administered 2013-12-23 – 2013-12-24 (×3): 80 mg via INTRAVENOUS
  Filled 2013-12-23 (×3): qty 8

## 2013-12-23 MED ORDER — SILODOSIN 8 MG PO CAPS
8.0000 mg | ORAL_CAPSULE | Freq: Every day | ORAL | Status: DC
  Filled 2013-12-23 (×2): qty 1

## 2013-12-23 MED ORDER — CETYLPYRIDINIUM CHLORIDE 0.05 % MT LIQD
7.0000 mL | Freq: Two times a day (BID) | OROMUCOSAL | Status: DC
  Administered 2013-12-24 – 2013-12-29 (×8): 7 mL via OROMUCOSAL

## 2013-12-23 MED ORDER — ISOSORBIDE MONONITRATE ER 30 MG PO TB24
30.0000 mg | ORAL_TABLET | Freq: Every day | ORAL | Status: DC
  Administered 2013-12-24 – 2013-12-29 (×6): 30 mg via ORAL
  Filled 2013-12-23 (×6): qty 1

## 2013-12-23 MED ORDER — FUROSEMIDE 10 MG/ML IJ SOLN
INTRAMUSCULAR | Status: AC
  Filled 2013-12-23: qty 8

## 2013-12-23 MED ORDER — TORSEMIDE 20 MG PO TABS
60.0000 mg | ORAL_TABLET | Freq: Two times a day (BID) | ORAL | Status: DC
  Filled 2013-12-23: qty 3

## 2013-12-23 MED ORDER — FUROSEMIDE 10 MG/ML IJ SOLN
60.0000 mg | Freq: Once | INTRAMUSCULAR | Status: AC
  Administered 2013-12-23: 60 mg via INTRAVENOUS

## 2013-12-23 NOTE — Progress Notes (Signed)
Patient returned from Echo stated that he felt a little dizzy and his vision was a little blurry. CBG is 178. BP 131/56 and HR 60. Patient continues to appear SOB of breath. O2 Sats are 97% on room air.

## 2013-12-23 NOTE — Progress Notes (Signed)
UR Completed Blakleigh Straw Graves-Bigelow, RN,BSN [phone]  

## 2013-12-23 NOTE — Progress Notes (Addendum)
Patient Name: [name] Date of Encounter: 12/23/2013    SUBJECTIVE: Doc developed sudden dyspnea and rapid response was called. He was moved to the ICU. He has foley re-inserted and is now somewhat better.  TELEMETRY:  AV seq pacing. Filed Vitals:   12/23/13 1502 12/23/13 1558 12/23/13 1637 12/23/13 1903  BP: 130/62 131/56 139/67 140/71  Pulse: 60 60 60 64  Temp: 98.4 F (36.9 C) 98 F (36.7 C) 98 F (36.7 C) 98.4 F (36.9 C)  TempSrc: Oral Oral Oral Oral  Resp: 16 20 22 22   Height:      Weight:      SpO2: 99% 97% 100% 100%    Intake/Output Summary (Last 24 hours) at 12/23/13 2020 Last data filed at 12/23/13 1645  Gross per 24 hour  Intake    483 ml  Output    775 ml  Net   -292 ml   LABS: Basic Metabolic Panel:  Recent Labs  12/22/13 1504 12/23/13 0535  NA 136* 135*  K 4.9 4.5  CL 95* 93*  CO2 29 26  GLUCOSE 210* 226*  BUN 45* 45*  CREATININE 2.63* 2.59*  CALCIUM 9.3 9.3   CBC:  Recent Labs  12/22/13 1504  WBC 7.1  HGB 11.5*  HCT 34.8*  MCV 100.9*  PLT 193     Radiology/Studies:  CLINICAL DATA: History of CHF. Coronary artery disease. Now with  acute dyspnea.  EXAM:  CHEST 2 VIEW  COMPARISON: 12/22/2013.  FINDINGS:  Hazy airspace opacity projects throughout both lungs. There is also  interstitial thickening. Heart is mildly enlarged. There are small  bilateral effusions. Thickening of the oblique fissures is noted.  Findings support congestive heart failure.  Stable left anterior chest wall sequential pacemaker.  IMPRESSION:  Congestive heart failure. Mild worsening since the previous day's  study.  Electronically Signed  By: Lajean Manes M.D.  On: 12/23/2013 18:50   ECHOCARDIOGRAM: Study Conclusions  - Left ventricle: The cavity size was normal. There was moderate concentric hypertrophy. Systolic function was mildly reduced. The estimated ejection fraction was in the range of 45% to 50%. Wall motion was normal;  there were no regional wall motion abnormalities. - Aortic valve: Trileaflet; moderately thickened, moderately calcified leaflets. Cusp separation was mildly reduced. Sclerosis without stenosis. Transvalvular velocity was minimally increased. There was no significant regurgitation. Valve area (VTI): 1.27 cm^2. Valve area (Vmax): 1.31 cm^2. - Aortic root: The aortic root was normal in size. - Ascending aorta: The ascending aorta was normal in size. - Mitral valve: There was mild regurgitation. - Left atrium: The atrium was mildly dilated. - Right ventricle: The cavity size was moderately dilated. Wall thickness was normal. Systolic function was mildly reduced. - Right atrium: The atrium was moderately dilated. - Pulmonary arteries: Systolic pressure was severely increased. PA peak pressure: 76 mm Hg (S).   Physical Exam: Blood pressure 140/71, pulse 64, temperature 98.4 F (36.9 C), temperature source Oral, resp. rate 22, height 5\' 7"  (1.702 m), weight 192 lb (87.091 kg), SpO2 100.00%. Weight change:   Wt Readings from Last 3 Encounters:  12/23/13 192 lb (87.091 kg)  12/21/13 195 lb 1.7 oz (88.5 kg)  12/13/13 186 lb (84.369 kg)   Moderate dyspnea and difficulty speaking. Chest reveals rales at the bases. S4 Gallop is audible. Extremities reveal no edema.   ASSESSMENT: 1. Acute on chronic diastolic heart failure, due to rehydration 2. Acute on chronic kidney disease, stage 4. 3. Severe  pulmonary hypertension 4. PAF  Plan:  1. Check sed rate, doubt amio pulmonary toxicity but will d/c 2. Aggressive diuresis 3. Long discussion with the patient and son informing that he is at end-of-life. Requested decision concerning code status and aggressiveness of care. I recommended DNR and Hospice/Palliative care. They will discuss, 4. No need for right heart cath and not a candidate for angiography.  Time spent on this encounter is 60 minutes.  Demetrios Isaacs 12/23/2013,  8:20 PM

## 2013-12-23 NOTE — Progress Notes (Addendum)
Patient complained of feeling SOB and having chest pain rating at an 8 on a 0-10 scale. 1 tab of sublingual Nitroglycerin given. STAT EKG performed. O2 applied at 2L [place]. HOB raised. Patient stated that he felt a little dizzy as well. Temp: 98.0; HR:60; Resp:22; BP: 139/67; O2: 100% on 2L. PA paged. Will continue to monitor patient for further changes in condition.

## 2013-12-23 NOTE — Progress Notes (Signed)
Report given and patient successfully transferred to unit. Belongings sent with family members and medications sent with patient.

## 2013-12-23 NOTE — Progress Notes (Signed)
Rapid Response notified feeling more SOB and having signs of audible wheezes. Asked if able to come and take a second look.

## 2013-12-23 NOTE — Progress Notes (Signed)
Chest pain subsided with 1 of Nitro. No other Nitro given.

## 2013-12-23 NOTE — Progress Notes (Signed)
I was called see the patient who was experiencing 7-8/10 CP which resolved after one SL NTG.   He was SOB.  2L O2 applied and sats were  100%.  He received 60mg  of Imdur this morning but due to dizziness it was decreased to 30mg  for tomorrow.  BP has been consistently 120-130's.  May need to  EKG shows a new TWI in V5-6 since 8/9 but it was there earlier then that.  Will check a troponin and CXR.  Lung exam has rales.  Aftrer reading other notes I am a little leary about giving lasix.  Will wait until CXR results.  He is currently stable and comfortable.   Exander Shaul, PAC

## 2013-12-23 NOTE — Evaluation (Signed)
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 He was d/c'd home and presents a day later with profound weakness and a fall at home.  Clinical Impression  Pt admitted with the above. Pt currently with functional limitations due to the deficits listed below (see PT Problem List). At the time of PT eval pt had buckling of knees, requiring max assist to recover and bring chair up for pt to sit. Pt states this has been happening ever since d/c. Pt will benefit from skilled PT to increase their independence and safety with mobility to allow discharge to the venue listed below.    Follow Up Recommendations Home health PT;Supervision/Assistance - 24 hour    Equipment Recommendations  None recommended by PT    Recommendations for Other Services       Precautions / Restrictions Precautions Precautions: Fall Required Braces or Orthoses: Other Brace/Splint;Spinal Brace Spinal Brace: Lumbar corset;Applied in sitting position;Other (comment) Spinal Brace Comments: long standing for comfort grossly 25years Other Brace/Splint: AFO LLE Restrictions Weight Bearing Restrictions: No      Mobility  Bed Mobility Overal bed mobility: Needs Assistance Bed Mobility: Supine to Sit     Supine to sit: Min guard     General bed mobility comments: use of rail and increased time  Transfers Overall transfer level: Needs assistance Equipment used: Rolling walker (2 wheeled) Transfers: Sit to/from Stand Sit to Stand: Min assist         General transfer comment: cues for hand placement and safety  Ambulation/Gait Ambulation/Gait assistance: Max assist Ambulation Distance (Feet): 10 Feet Assistive device: Rolling walker (2 wheeled) Gait Pattern/deviations: Step-to pattern;Decreased  stride length;Trunk flexed Gait velocity: decreased Gait velocity interpretation: Below normal speed for age/gender General Gait Details: Pt ambulating slowly with minimal unsteadiness, and then suddenly knees buckled and required max assist to recover. Chair was quickly brought up behind him for seated rest break.   Stairs            Wheelchair Mobility    Modified Rankin (Stroke Patients Only)       Balance Overall balance assessment: Needs assistance Sitting-balance support: Feet supported;No upper extremity supported Sitting balance-Leahy Scale: Fair     Standing balance support: Bilateral upper extremity supported;During functional activity Standing balance-Leahy Scale: Poor                               Pertinent Vitals/Pain Pain Assessment: No/denies pain    Home Living Family/patient expects to be discharged to:: Private residence Living Arrangements: Other relatives;Children Available Help at Discharge: Family;Friend(s);Available 24 hours/day Type of Home: House Home Access: Stairs to enter Entrance Stairs-Rails: Right;Left;Can reach both Entrance Stairs-Number of Steps: 2 Home Layout: One level (2 steps to get from one part of the house to another) Home Equipment: Walker - 4 wheels;Shower seat      Prior Function Level of Independence: Needs assistance   Gait / Transfers Assistance Needed: Idependent with the rollator  ADL's / Homemaking Assistance Needed: Stepping in and out of shower needs assist, washing back needs assist        Hand Dominance   Dominant Hand: Right    Extremity/Trunk Assessment   Upper Extremity Assessment: Defer to OT evaluation  Lower Extremity Assessment: LLE deficits/detail   LLE Deficits / Details: Foot drop and decreased sensation consistent with spinal stenosis and peroneal nerve damage.  Cervical / Trunk Assessment: Kyphotic  Communication   Communication: HOH  Cognition  Arousal/Alertness: Awake/alert Behavior During Therapy: WFL for tasks assessed/performed Overall Cognitive Status: Within Functional Limits for tasks assessed                      General Comments      Exercises        Assessment/Plan    PT Assessment Patient needs continued PT services  PT Diagnosis Difficulty walking;Generalized weakness   PT Problem List Decreased strength;Decreased range of motion;Decreased activity tolerance;Decreased balance;Decreased mobility;Decreased knowledge of use of DME;Decreased safety awareness;Decreased knowledge of precautions  PT Treatment Interventions Gait training;Stair training;Functional mobility training;Therapeutic activities;Therapeutic exercise;Patient/family education;Balance training   PT Goals (Current goals can be found in the Care Plan section) Acute Rehab PT Goals Patient Stated Goal: return to work PT Goal Formulation: With patient Time For Goal Achievement: 01/02/14 Potential to Achieve Goals: Good    Frequency Min 3X/week   Barriers to discharge        Co-evaluation               End of Session Equipment Utilized During Treatment: Gait belt;Back brace Activity Tolerance: Patient tolerated treatment well Patient left: in chair;with call bell/phone within reach;with family/visitor present Nurse Communication: Mobility status    Functional Assessment Tool Used: Clinical judgement Functional Limitation: Mobility: Walking and moving around Mobility: Walking and Moving Around Current Status [id number]): At least 40 percent but less than 60 percent impaired, limited or restricted Mobility: Walking and Moving Around Goal Status [id number]): At least 40 percent but less than 60 percent impaired, limited or restricted    Time: 9211-9417 PT Time Calculation (min): 31 min   Charges:   PT Evaluation $Initial PT Evaluation Tier I: 1 Procedure PT Treatments $Gait Training: 8-22 mins $Therapeutic Activity: 8-22 mins    PT G Codes:   Functional Assessment Tool Used: Clinical judgement Functional Limitation: Mobility: Walking and moving around    [place] 12/23/2013, 3:59 PM  Jolyn Lent, PT, DPT Acute Rehabilitation Services Pager: [phone]

## 2013-12-23 NOTE — Progress Notes (Addendum)
Pt. Seen and examined. Agree with the NP/PA-C note as written.  Really no improvement in dizziness. Sodium and chloride are decreasing, with rising BNP. Looked clinically dry, however, this could suggest dilutional volume overload. JVP appears to be up today. Difficult clinical picture. I think we need to consider RHC to evaluate volume status - if he has significantly elevated filling pressures, would leave Swan-Ganz catheter in to help gauge diuresis.  Repeat echocardiogram today to evaluate for change in LV function and LV filling pressures compared to prior study in 06/2013.  Pixie Casino, MD, [facility] Attending Cardiologist [place]

## 2013-12-23 NOTE — Progress Notes (Signed)
Patient Name: [name] Date of Encounter: 12/23/2013  Principal Problem:   Dehydration Active Problems:   Chronic diastolic CHF (congestive heart failure)   Hypertension   Paroxysmal atrial fibrillation, DCCV 06/16/13   Pacemaker - St Jude   Urinary retention, now with foley cath and leg bag.   CKD (chronic kidney disease) stage 4, GFR 15-29 ml/min    Patient Profile: 80 yo male w/ hx D-CHF (d/c 08/10), CAD, PPM, PAF, CKD, urinary retention was admitted 08/11 w/ weakness, dehydration.   SUBJECTIVE: Still very weak, has been having problems with orthostatic dizziness PTA, thinks amiodarone contributes.   OBJECTIVE Filed Vitals:   12/22/13 2135 12/23/13 0545 12/23/13 0546 12/23/13 1002  BP: 125/53   135/57  Pulse: 67  67 61  Temp: 97.9 F (36.6 C)  98.1 F (36.7 C)   TempSrc: Oral  Oral   Resp: 18  18   Height:      Weight:  192 lb (87.091 kg)    SpO2: 99%       Intake/Output Summary (Last 24 hours) at 12/23/13 1031 Last data filed at 12/23/13 1002  Gross per 24 hour  Intake    483 ml  Output    600 ml  Net   -117 ml   Filed Weights   12/22/13 1359 12/22/13 2134 12/23/13 0545  Weight: 193 lb 8 oz (87.771 kg) 191 lb 3.2 oz (86.728 kg) 192 lb (87.091 kg)    PHYSICAL EXAM General: Well developed, well nourished, male in no acute distress. Head: Normocephalic, atraumatic.  Neck: Supple without bruits, JVD at 8 cm. Lungs:  Resp regular and unlabored, some rales bases. Heart: RRR, S1, S2, no S3, S4, or murmur; no rub. Abdomen: Soft, non-tender, non-distended, BS + x 4.  Extremities: No clubbing, cyanosis, no edema.  Neuro: Alert and oriented X 3. Moves all extremities spontaneously. Psych: Normal affect.  LABS: CBC: Recent Labs  12/22/13 1504  WBC 7.1  HGB 11.5*  HCT 34.8*  MCV 100.9*  PLT 518   Basic Metabolic Panel: Recent Labs  12/22/13 1504 12/23/13 0535  NA 136* 135*  K 4.9 4.5  CL 95* 93*  CO2 29 26  GLUCOSE 210* 226*  BUN 45* 45*   CREATININE 2.63* 2.59*  CALCIUM 9.3 9.3   Liver Function Tests: Recent Labs  12/22/13 1504 12/23/13 0535  AST 50* 48*  ALT 38 42  ALKPHOS 64 66  BILITOT 0.3 0.4  PROT 7.1 7.2  ALBUMIN 3.3* 3.4*    Recent Labs  12/22/13 1541  TROPIPOC 0.13*   BNP: Pro B Natriuretic peptide (BNP)  Date/Time Value Ref Range Status  12/23/2013  5:35 AM 4564.0* 0 - 450 pg/mL Final  12/22/2013  3:04 PM 3984.0* 0 - 450 pg/mL Final    TELE:   Generally A pacing, some AV pacing, 1 episode ?fusion beats  Radiology/Studies: Dg Chest 2 View 12/22/2013   CLINICAL DATA:  Weakness.  EXAM: CHEST  2 VIEW  COMPARISON:  CT chest 09/05/2013. Plain film of the chest 12/18/2013, 11/20/2013 and 09/05/2013.  FINDINGS: Pacing device remains in place. There are small bilateral pleural effusions. Heart size is enlarged and there is interstitial edema. No pneumothorax identified.  IMPRESSION: Cardiomegaly and mild interstitial edema with associated small pleural effusions.   Electronically Signed   By: Inge Rise M.D.   On: 12/22/2013 15:59     Current Medications:  . allopurinol  200 mg Oral Daily  . amiodarone  200 mg Oral Daily  . apixaban  2.5 mg Oral BID  . dutasteride  0.5 mg Oral Daily  . [START ON 12/24/2013] ferrous sulfate  325 mg Oral QODAY  . hydrALAZINE  25 mg Oral Q12H  . insulin aspart  2-4 Units Subcutaneous Q lunch  . insulin aspart  9 Units Subcutaneous BID WC  . insulin glargine  12 Units Subcutaneous QHS  . isosorbide mononitrate  60 mg Oral Daily  . latanoprost  1 drop Both Eyes QHS  . loratadine  10 mg Oral Daily  . metoprolol tartrate  25 mg Oral Daily  . multivitamin with minerals  1 tablet Oral Daily  . potassium chloride SA  20 mEq Oral Daily  . ranolazine  500 mg Oral BID  . sodium chloride  3 mL Intravenous Q12H  . tamsulosin  0.4 mg Oral QPC supper      ASSESSMENT AND PLAN: Principal Problem:   Dehydration - continue IVF till 1 liter infused. They tried orthostatic VS  this am, pt unable to stand. Have PT see.  Active Problems:   Chronic diastolic CHF (congestive heart failure) -pt says dry weight is 193, follow I/O, weights    Hypertension - SBP 120s-130s now, follow    Paroxysmal atrial fibrillation, DCCV 06/16/13 - none seen, follow on telemetry    Pacemaker - St Jude - generally A pacing, some AV pacing, 1 episode ?fusion beats    Urinary retention, now with foley cath and leg bag. - not currently with foley, voiding OK     CKD (chronic kidney disease) stage 4, GFR 15-29 ml/min - Cr improved slightly but BUN no change, continue to follow with hydration.    Weakness - have PT see    Orthostatic dizziness - MD review medications, may need to decrease Imdur or other medications if this does not improve with hydration.   Jonetta Speak , PA-C 10:31 AM 12/23/2013

## 2013-12-23 NOTE — Progress Notes (Signed)
Nutrition Brief Note  Patient identified on the Malnutrition Screening Tool (MST) Report  Wt Readings from Last 15 Encounters:  12/23/13 192 lb (87.091 kg)  12/21/13 195 lb 1.7 oz (88.5 kg)  12/13/13 186 lb (84.369 kg)  12/03/13 193 lb (87.544 kg)  11/20/13 190 lb 1 oz (86.212 kg)  11/03/13 190 lb (86.183 kg)  10/09/13 198 lb (89.812 kg)  10/06/13 195 lb (88.451 kg)  09/21/13 187 lb 4.8 oz (84.959 kg)  09/15/13 187 lb 12.8 oz (85.186 kg)  09/10/13 187 lb (84.823 kg)  09/05/13 189 lb (85.73 kg)  09/03/13 187 lb 12.8 oz (85.186 kg)  08/21/13 195 lb (88.451 kg)  08/14/13 188 lb 11.2 oz (85.594 kg)    Body mass index is 30.06 kg/(m^2). Patient meets criteria for Obesity based on current BMI.  Per MST, pt stated he was unsure if he has lost weight and he denied eating poorly. Per weight history, pt's weight has been fairly stable.    Current diet order is 2 Gram Sodium, patient is consuming approximately 75% of meals at this time. Labs and medications reviewed.   No nutrition interventions warranted at this time. If nutrition issues arise, please consult RD.   Pryor Ochoa RD, LDN Inpatient Clinical Dietitian Pager: [phone] After Hours Pager: [phone]

## 2013-12-23 NOTE — Significant Event (Signed)
Rapid Response Event Note  Overview:  Called to assist with patient with increased WOB Time Called: 1806 Event Type: Respiratory  Initial Focused Assessment:  On arrival alert - w/d - oriented - using some accessory muscles to breathe - some audible wheezing noted - bil BS present - decreased BS left side - some fine crackles noted - patient able to talk but with increased SOB - takes time to finish a sentence.  Moderately uncomfortable - c/o some pressure in his chest.  Some JVD noted.  Atrial pacing on monitor rate 67.  Abd soft.  RN states they had to in/out cath today for urinary retention.  Slight pedal edema noted.  Patient states his breathing has gradually become more difficult today.  Left PIV NSL patent.  BP 168/78 HR 67 RR 32 O2 sat 98% on 2 liter nasal cannula.    Interventions:  Order for 2 view chest xray - transported to xray by myself - has low tolerance of activity with increased SOB and wheezing.  Back to room.  Spoke with Tarri Fuller PA - update given.  Order for 60 mg IV Lasix and foley catheter.  Can prn Bipap as needed.  Patient - who is a physician - updated.  States he does not feel like he needs Bipap at present time.  Lasix given and foley placed -- 500 cc clear urine out.  After 20 minutes patient states he feels some better.  Able to speak full sentences now with mild SOB.  Still with accessory muscle use but less.  Will hold off on dinner for another hour or so until cleared of needing Bipap.  Report called to [place] by Lonzo Cloud RN.  Bp 140/61 HR 678 atrial paced - RR 24 O2 sat 99%. Will place in ICU overnight - low threshold for Bipap - for possible RHC and Swan-ganz in am.    Transported to 2H06 via bed with monitor and O2 by Lonzo Cloud RN and Nigel Mormon.  Family and patient updated.    Event Summary: Name of Physician Notified: Tarri Fuller PA at  (pta RRT)    at    Outcome: Transferred (Comment) (overflow to ICU 2H )  Event End Time: 1930  Quin Hoop

## 2013-12-23 NOTE — Progress Notes (Signed)
Echo Lab  2D Echocardiogram completed.  [place], RDCS 12/23/2013 3:42 PM

## 2013-12-24 DIAGNOSIS — N184 Chronic kidney disease, stage 4 (severe): Secondary | ICD-10-CM | POA: Diagnosis present

## 2013-12-24 DIAGNOSIS — E860 Dehydration: Secondary | ICD-10-CM | POA: Diagnosis present

## 2013-12-24 DIAGNOSIS — Z888 Allergy status to other drugs, medicaments and biological substances status: Secondary | ICD-10-CM | POA: Diagnosis not present

## 2013-12-24 DIAGNOSIS — E785 Hyperlipidemia, unspecified: Secondary | ICD-10-CM | POA: Diagnosis present

## 2013-12-24 DIAGNOSIS — I2510 Atherosclerotic heart disease of native coronary artery without angina pectoris: Secondary | ICD-10-CM | POA: Diagnosis present

## 2013-12-24 DIAGNOSIS — Z5189 Encounter for other specified aftercare: Secondary | ICD-10-CM | POA: Diagnosis not present

## 2013-12-24 DIAGNOSIS — M129 Arthropathy, unspecified: Secondary | ICD-10-CM | POA: Diagnosis present

## 2013-12-24 DIAGNOSIS — I5043 Acute on chronic combined systolic (congestive) and diastolic (congestive) heart failure: Secondary | ICD-10-CM | POA: Diagnosis present

## 2013-12-24 DIAGNOSIS — Z96659 Presence of unspecified artificial knee joint: Secondary | ICD-10-CM | POA: Diagnosis not present

## 2013-12-24 DIAGNOSIS — Z807 Family history of other malignant neoplasms of lymphoid, hematopoietic and related tissues: Secondary | ICD-10-CM | POA: Diagnosis not present

## 2013-12-24 DIAGNOSIS — Z794 Long term (current) use of insulin: Secondary | ICD-10-CM | POA: Diagnosis not present

## 2013-12-24 DIAGNOSIS — I509 Heart failure, unspecified: Secondary | ICD-10-CM | POA: Diagnosis present

## 2013-12-24 DIAGNOSIS — I4891 Unspecified atrial fibrillation: Secondary | ICD-10-CM

## 2013-12-24 DIAGNOSIS — I2789 Other specified pulmonary heart diseases: Secondary | ICD-10-CM | POA: Diagnosis present

## 2013-12-24 DIAGNOSIS — R911 Solitary pulmonary nodule: Secondary | ICD-10-CM | POA: Diagnosis present

## 2013-12-24 DIAGNOSIS — E119 Type 2 diabetes mellitus without complications: Secondary | ICD-10-CM | POA: Diagnosis present

## 2013-12-24 DIAGNOSIS — Z8249 Family history of ischemic heart disease and other diseases of the circulatory system: Secondary | ICD-10-CM | POA: Diagnosis not present

## 2013-12-24 DIAGNOSIS — I209 Angina pectoris, unspecified: Secondary | ICD-10-CM | POA: Diagnosis present

## 2013-12-24 DIAGNOSIS — Z87891 Personal history of nicotine dependence: Secondary | ICD-10-CM | POA: Diagnosis not present

## 2013-12-24 DIAGNOSIS — Z7901 Long term (current) use of anticoagulants: Secondary | ICD-10-CM | POA: Diagnosis not present

## 2013-12-24 DIAGNOSIS — R339 Retention of urine, unspecified: Secondary | ICD-10-CM | POA: Diagnosis present

## 2013-12-24 DIAGNOSIS — N179 Acute kidney failure, unspecified: Secondary | ICD-10-CM | POA: Diagnosis present

## 2013-12-24 DIAGNOSIS — I129 Hypertensive chronic kidney disease with stage 1 through stage 4 chronic kidney disease, or unspecified chronic kidney disease: Secondary | ICD-10-CM | POA: Diagnosis present

## 2013-12-24 DIAGNOSIS — Z950 Presence of cardiac pacemaker: Secondary | ICD-10-CM | POA: Diagnosis not present

## 2013-12-24 DIAGNOSIS — Z8673 Personal history of transient ischemic attack (TIA), and cerebral infarction without residual deficits: Secondary | ICD-10-CM | POA: Diagnosis not present

## 2013-12-24 DIAGNOSIS — I739 Peripheral vascular disease, unspecified: Secondary | ICD-10-CM | POA: Diagnosis present

## 2013-12-24 DIAGNOSIS — R4182 Altered mental status, unspecified: Secondary | ICD-10-CM | POA: Diagnosis present

## 2013-12-24 DIAGNOSIS — I10 Essential (primary) hypertension: Secondary | ICD-10-CM

## 2013-12-24 LAB — BASIC METABOLIC PANEL
Anion gap: 16 — ABNORMAL HIGH (ref 5–15)
BUN: 39 mg/dL — ABNORMAL HIGH (ref 6–23)
CALCIUM: 9.3 mg/dL (ref 8.4–10.5)
CO2: 25 mEq/L (ref 19–32)
CREATININE: 2.18 mg/dL — AB (ref 0.50–1.35)
Chloride: 94 mEq/L — ABNORMAL LOW (ref 96–112)
GFR calc Af Amer: 28 mL/min — ABNORMAL LOW (ref >90)
GFR calc non Af Amer: 24 mL/min — ABNORMAL LOW (ref >90)
GLUCOSE: 168 mg/dL — AB (ref 70–99)
Potassium: 4.3 mEq/L (ref 3.7–5.3)
Sodium: 135 mEq/L — ABNORMAL LOW (ref 137–147)

## 2013-12-24 LAB — GLUCOSE, CAPILLARY
GLUCOSE-CAPILLARY: 191 mg/dL — AB (ref 70–99)
GLUCOSE-CAPILLARY: 172 mg/dL — AB (ref 70–99)
Glucose-Capillary: 99 mg/dL (ref 70–99)
Glucose-Capillary: 312 mg/dL — ABNORMAL HIGH (ref 70–99)
Glucose-Capillary: 180 mg/dL — ABNORMAL HIGH (ref 70–99)

## 2013-12-24 LAB — TROPONIN I
Troponin I: <0.3 ng/mL (ref <0.30)
Troponin I: <0.3 ng/mL (ref <0.30)

## 2013-12-24 LAB — SEDIMENTATION RATE: Sed Rate: 40 mm/hr — ABNORMAL HIGH (ref 0–16)

## 2013-12-24 MED ORDER — FUROSEMIDE 10 MG/ML IJ SOLN
40.0000 mg | Freq: Once | INTRAMUSCULAR | Status: DC
  Filled 2013-12-24: qty 4

## 2013-12-24 MED ORDER — HYDRALAZINE HCL 25 MG PO TABS
25.0000 mg | ORAL_TABLET | Freq: Three times a day (TID) | ORAL | Status: DC
  Administered 2013-12-24 (×3): 25 mg via ORAL
  Filled 2013-12-24 (×6): qty 1

## 2013-12-24 MED ORDER — FUROSEMIDE 10 MG/ML IJ SOLN
80.0000 mg | Freq: Two times a day (BID) | INTRAMUSCULAR | Status: DC
  Administered 2013-12-24: 80 mg via INTRAVENOUS
  Filled 2013-12-24 (×3): qty 8

## 2013-12-24 MED ORDER — AMIODARONE HCL 200 MG PO TABS
200.0000 mg | ORAL_TABLET | Freq: Every day | ORAL | Status: DC
  Administered 2013-12-24: 200 mg via ORAL
  Filled 2013-12-24 (×2): qty 1

## 2013-12-24 MED ORDER — INSULIN ASPART 100 UNIT/ML ~~LOC~~ SOLN
9.0000 [IU] | Freq: Two times a day (BID) | SUBCUTANEOUS | Status: DC
  Administered 2013-12-24 (×2): 9 [IU] via SUBCUTANEOUS

## 2013-12-24 MED ORDER — FUROSEMIDE 10 MG/ML IJ SOLN
40.0000 mg | Freq: Once | INTRAMUSCULAR | Status: AC
  Administered 2013-12-24: 40 mg via INTRAVENOUS

## 2013-12-24 MED ORDER — FUROSEMIDE 40 MG PO TABS: 40.0000 mg | ORAL_TABLET | Freq: Every day | ORAL | Status: DC

## 2013-12-24 MED ORDER — INSULIN ASPART 100 UNIT/ML ~~LOC~~ SOLN
0.0000 [IU] | Freq: Three times a day (TID) | SUBCUTANEOUS | Status: DC
  Administered 2013-12-25: 5 [IU] via SUBCUTANEOUS
  Administered 2013-12-25: 3 [IU] via SUBCUTANEOUS
  Administered 2013-12-25: 5 [IU] via SUBCUTANEOUS
  Administered 2013-12-26: 3 [IU] via SUBCUTANEOUS
  Administered 2013-12-26: 8 [IU] via SUBCUTANEOUS
  Administered 2013-12-26: 11 [IU] via SUBCUTANEOUS
  Administered 2013-12-27 (×2): 8 [IU] via SUBCUTANEOUS
  Administered 2013-12-28 – 2013-12-29 (×3): 5 [IU] via SUBCUTANEOUS
  Administered 2013-12-29: 3 [IU] via SUBCUTANEOUS

## 2013-12-24 NOTE — Progress Notes (Addendum)
SUBJECTIVE:  Feels much better with less SOB  OBJECTIVE:   Vitals:   Filed Vitals:   12/24/13 0306 12/24/13 0400 12/24/13 0500 12/24/13 0750  BP: 126/45 140/59 144/52 157/79  Pulse: 64 59 63   Temp: 99 F (37.2 C)   99.8 F (37.7 C)  TempSrc: Oral   Oral  Resp: 22 19 21 20   Height:      Weight:   198 lb 10.2 oz (90.1 kg)   SpO2: 97% 91% 93% 92%   I&O's:   Intake/Output Summary (Last 24 hours) at 12/24/13 6045 Last data filed at 12/24/13 0500  Gross per 24 hour  Intake    483 ml  Output   2575 ml  Net  -2092 ml   TELEMETRY: Reviewed telemetry pt in atrial paced rhythm     PHYSICAL EXAM General: Well developed, well nourished, in no acute distress Head: Eyes PERRLA, No xanthomas.   Normal cephalic and atramatic  Lungs:   Crackles at bases bilaterally. Heart:   HRRR S1 S2 Pulses are 2+ & equal. Abdomen: Bowel sounds are positive, abdomen soft and non-tender without masses  Extremities:   No clubbing, cyanosis or edema.  DP +1 Neuro: Alert and oriented X 3. Psych:  Good affect, responds appropriately   LABS: Basic Metabolic Panel:  Recent Labs  12/22/13 1504 12/23/13 0535  NA 136* 135*  K 4.9 4.5  CL 95* 93*  CO2 29 26  GLUCOSE 210* 226*  BUN 45* 45*  CREATININE 2.63* 2.59*  CALCIUM 9.3 9.3   Liver Function Tests:  Recent Labs  12/22/13 1504 12/23/13 0535  AST 50* 48*  ALT 38 42  ALKPHOS 64 66  BILITOT 0.3 0.4  PROT 7.1 7.2  ALBUMIN 3.3* 3.4*   No results found for this basename: LIPASE, AMYLASE,  in the last 72 hours CBC:  Recent Labs  12/22/13 1504  WBC 7.1  HGB 11.5*  HCT 34.8*  MCV 100.9*  PLT 193   Cardiac Enzymes:  Recent Labs  12/23/13 2052 12/24/13 0142  TROPONINI <0.30 <0.30   BNP: No components found with this basename: POCBNP,  D-Dimer: No results found for this basename: DDIMER,  in the last 72 hours Hemoglobin A1C: No results found for this basename: HGBA1C,  in the last 72 hours Fasting Lipid Panel: No  results found for this basename: CHOL, HDL, LDLCALC, TRIG, CHOLHDL, LDLDIRECT,  in the last 72 hours Thyroid Function Tests: No results found for this basename: TSH, T4TOTAL, FREET3, T3FREE, THYROIDAB,  in the last 72 hours Anemia Panel: No results found for this basename: VITAMINB12, FOLATE, FERRITIN, TIBC, IRON, RETICCTPCT,  in the last 72 hours Coag Panel:   Lab Results  Component Value Date   INR 1.01 08/21/2012   INR 0.98 08/18/2012   INR 1.00 02/29/2012    RADIOLOGY: Dg Chest 2 View  12/23/2013   CLINICAL DATA:  History of CHF. Coronary artery disease. Now with acute dyspnea.  EXAM: CHEST  2 VIEW  COMPARISON:  12/22/2013.  FINDINGS: Hazy airspace opacity projects throughout both lungs. There is also interstitial thickening. Heart is mildly enlarged. There are small bilateral effusions. Thickening of the oblique fissures is noted. Findings support congestive heart failure.  Stable left anterior chest wall sequential pacemaker.  IMPRESSION: Congestive heart failure. Mild worsening since the previous day's study.   Electronically Signed   By: Lajean Manes M.D.   On: 12/23/2013 18:50   Dg Chest 2 View  12/22/2013   CLINICAL DATA:  Weakness.  EXAM: CHEST  2 VIEW  COMPARISON:  CT chest 09/05/2013. Plain film of the chest 12/18/2013, 11/20/2013 and 09/05/2013.  FINDINGS: Pacing device remains in place. There are small bilateral pleural effusions. Heart size is enlarged and there is interstitial edema. No pneumothorax identified.  IMPRESSION: Cardiomegaly and mild interstitial edema with associated small pleural effusions.   Electronically Signed   By: Inge Rise M.D.   On: 12/22/2013 15:59   Dg Chest Port 1 View  12/18/2013   CLINICAL DATA:  Chest pain and shortness of breath.  EXAM: PORTABLE CHEST - 1 VIEW  COMPARISON:  Chest radiograph November 20, 2013  FINDINGS: The cardiac silhouette remains mildly enlarged, mediastinal silhouette is nonsuspicious. Central pulmonary vascular congestion with  bibasilar patchy airspace opacities. No pleural effusions. No pneumothorax. Dual lead left cardiac pacemaker in situ. No pneumothorax.  Soft tissue planes and included osseous structures unremarkable.  IMPRESSION: Stable cardiomegaly, increasing central pulmonary vascular congestion with bibasilar patchy airspace opacities which could reflect confluent edema, less likely pneumonia. Recommend followup chest radiograph after treatment to verify improvement.   Electronically Signed   By: Elon Alas   On: 12/18/2013 05:57   ASSESSMENT:  1. Acute on chronic diastolic heart failure, due to rehydration - he responded well to IV Lasix last night.  He is breathing comfortably lying flat.  He put out 2L yesterday and is 2.4L net neg.  Weight is up 3 lbs. 2. Acute on chronic kidney disease, stage 4.  3. Severe pulmonary hypertension  4. PAF maintaining A paced rhythm  Plan:  1. Check sed rate, doubt amio pulmonary toxicity but it has been d/c'd  2. Aggressive diuresis - will continue with current dosing of IV Lasix today 3. [name] had a long discussion with the patient and son informing that he is at end-of-life. Requested decision concerning code status and aggressiveness of care. He recommended DNR and Hospice/Palliative care. The family are all traveling today and will be here late this afternoon for family conference.  4. No need for right heart cath and not a candidate for angiography. 5.  Increase Hydralazine to 25mg  TID for better BP control 6.  Continue BB and Apixipan  Sueanne Margarita, MD  12/24/2013  8:32 AM

## 2013-12-24 NOTE — Progress Notes (Signed)
Inpatient Diabetes Program Recommendations  AACE/ADA: New Consensus Statement on Inpatient Glycemic Control (2013)  Target Ranges:  Prepandial:   less than 140 mg/dL      Peak postprandial:   less than 180 mg/dL (1-2 hours)      Critically ill patients:  140 - 180 mg/dL   Results for [name], [name] (MRN [id number]) as of 12/24/2013 08:49  Ref. Range 12/23/2013 06:15 12/23/2013 12:12 12/23/2013 16:02 12/23/2013 21:54 12/23/2013 23:16  Glucose-Capillary Latest Range: 70-99 mg/dL 230 (H) 237 (H) 178 (H) 59 (L) 99   Diabetes history: DM2 Outpatient Diabetes medications: Lantus 12 units QHS, Novolog 2-9 units TID with meals Current orders for Inpatient glycemic control: Lantus 12 units QHS, Novolog 9 units BID with breakfast and supper, Novolog 2-4 units with lunch  Inpatient Diabetes Program Recommendations Correction (SSI): Please consider discontinuing Novolog 9 units BID and Novolog 2-4 units with lunch and consider ordering Novolog moderate correction scale ACHS while inpatient.  Thanks, Barnie Alderman, RN, MSN, CCRN Diabetes Coordinator Inpatient Diabetes Program [phone] (Team Pager) [phone] ([facility] office) [phone] [facility] office)

## 2013-12-24 NOTE — Progress Notes (Signed)
  Had conversation with Dr. Kennon Holter and family( 3 daughters and son) to discuss level of care and code status. After extensive discussion and clear understanding of choices (Conservative management with Hospice and Palliative Care vs aggressive management including CPR and life support) and my recommendation for palliative approach, Doc has chosen aggressive alternative stating "I am not ready to die". Plan will be to continue current level of care until the end. He is actually better today and should be able to move to the floor in the AM. He has given me discretion to remove him from life support if this occurs.

## 2013-12-24 NOTE — Progress Notes (Signed)
UR Completed [place] Dona Graves-Bigelow, RN,BSN [phone]  

## 2013-12-25 LAB — GLUCOSE, CAPILLARY
GLUCOSE-CAPILLARY: 228 mg/dL — AB (ref 70–99)
GLUCOSE-CAPILLARY: 227 mg/dL — AB (ref 70–99)
Glucose-Capillary: 186 mg/dL — ABNORMAL HIGH (ref 70–99)
Glucose-Capillary: 197 mg/dL — ABNORMAL HIGH (ref 70–99)

## 2013-12-25 LAB — BASIC METABOLIC PANEL
Anion gap: 12 (ref 5–15)
BUN: 39 mg/dL — ABNORMAL HIGH (ref 6–23)
CHLORIDE: 95 meq/L — AB (ref 96–112)
CO2: 30 mEq/L (ref 19–32)
Calcium: 9.3 mg/dL (ref 8.4–10.5)
Creatinine, Ser: 2.2 mg/dL — ABNORMAL HIGH (ref 0.50–1.35)
GFR calc Af Amer: 28 mL/min — ABNORMAL LOW (ref >90)
GFR calc non Af Amer: 24 mL/min — ABNORMAL LOW (ref >90)
Glucose, Bld: 230 mg/dL — ABNORMAL HIGH (ref 70–99)
POTASSIUM: 4.2 meq/L (ref 3.7–5.3)
Sodium: 137 mEq/L (ref 137–147)

## 2013-12-25 MED ORDER — AMIODARONE HCL 200 MG PO TABS
200.0000 mg | ORAL_TABLET | Freq: Every day | ORAL | Status: DC
  Administered 2013-12-25 – 2013-12-29 (×5): 200 mg via ORAL
  Filled 2013-12-25 (×5): qty 1

## 2013-12-25 MED ORDER — TORSEMIDE 20 MG PO TABS
80.0000 mg | ORAL_TABLET | Freq: Two times a day (BID) | ORAL | Status: DC
  Administered 2013-12-25 – 2013-12-29 (×8): 80 mg via ORAL
  Filled 2013-12-25 (×12): qty 4

## 2013-12-25 MED ORDER — HYDRALAZINE HCL 25 MG PO TABS
25.0000 mg | ORAL_TABLET | Freq: Two times a day (BID) | ORAL | Status: DC
  Administered 2013-12-25 – 2013-12-29 (×9): 25 mg via ORAL
  Filled 2013-12-25 (×11): qty 1

## 2013-12-25 NOTE — Progress Notes (Signed)
Dr. Radford Pax and I spoke this morning. The current plan is to aggressive diuretic regimen. Decision made to back off some of his blood pressure medications and allow a slightly higher pressure. This may prevent the patient from being quite as weak as he was on this return hospital stay. He would need case management help with reference to increasing support at home. I will be on vacation for the next week. Our consider discharge when he is stable on a diuretic regimen that is maintaining a stable weight.

## 2013-12-25 NOTE — Progress Notes (Signed)
PT Cancellation Note  Patient Details Name: [name] MRN: [id number] DOB: [birth year]   Cancelled Treatment:    Reason Eval/Treat Not Completed: Patient declined, stating he would like to finish getting cleaned up and eat his breakfast prior to working with therapy. PT encouraged pt participation, however he and caregiver both refuse at this time. Will check back as schedule allows.    Jolyn Lent 12/25/2013, 11:07 AM  Jolyn Lent, PT, DPT Acute Rehabilitation Services Pager: [phone]

## 2013-12-25 NOTE — Progress Notes (Addendum)
SUBJECTIVE:  Denies SOB  OBJECTIVE:   Vitals:   Filed Vitals:   12/25/13 0200 12/25/13 0318 12/25/13 0400 12/25/13 0600  BP: 145/47 110/41 126/48 134/44  Pulse: 65 77 64 62  Temp:  98.4 F (36.9 C)    TempSrc:  Oral    Resp: 16 13 18 17   Height:      Weight:  190 lb 7.6 oz (86.4 kg)    SpO2: 94% 99% 97% 94%   I&O's:   Intake/Output Summary (Last 24 hours) at 12/25/13 2595 Last data filed at 12/25/13 0600  Gross per 24 hour  Intake    720 ml  Output   3175 ml  Net  -2455 ml   TELEMETRY: Reviewed telemetry pt in A paced:     PHYSICAL EXAM General: Well developed, well nourished, in no acute distress Head: Eyes PERRLA, No xanthomas.   Normal cephalic and atramatic  Lungs:   Clear bilaterally to auscultation and percussion. Heart:   HRRR S1 S2 Pulses are 2+ & equal. Abdomen: Bowel sounds are positive, abdomen soft and non-tender without masses  Extremities:   No clubbing, cyanosis or edema.  DP +1 Neuro: Alert and oriented X 3. Psych:  Good affect, responds appropriately   LABS: Basic Metabolic Panel:  Recent Labs  12/23/13 0535 12/24/13 0825  NA 135* 135*  K 4.5 4.3  CL 93* 94*  CO2 26 25  GLUCOSE 226* 168*  BUN 45* 39*  CREATININE 2.59* 2.18*  CALCIUM 9.3 9.3   Liver Function Tests:  Recent Labs  12/22/13 1504 12/23/13 0535  AST 50* 48*  ALT 38 42  ALKPHOS 64 66  BILITOT 0.3 0.4  PROT 7.1 7.2  ALBUMIN 3.3* 3.4*   No results found for this basename: LIPASE, AMYLASE,  in the last 72 hours CBC:  Recent Labs  12/22/13 1504  WBC 7.1  HGB 11.5*  HCT 34.8*  MCV 100.9*  PLT 193   Cardiac Enzymes:  Recent Labs  12/23/13 2052 12/24/13 0142 12/24/13 0825  TROPONINI <0.30 <0.30 <0.30   BNP: No components found with this basename: POCBNP,  D-Dimer: No results found for this basename: DDIMER,  in the last 72 hours Hemoglobin A1C: No results found for this basename: HGBA1C,  in the last 72 hours Fasting Lipid Panel: No results found  for this basename: CHOL, HDL, LDLCALC, TRIG, CHOLHDL, LDLDIRECT,  in the last 72 hours Thyroid Function Tests: No results found for this basename: TSH, T4TOTAL, FREET3, T3FREE, THYROIDAB,  in the last 72 hours Anemia Panel: No results found for this basename: VITAMINB12, FOLATE, FERRITIN, TIBC, IRON, RETICCTPCT,  in the last 72 hours Coag Panel:   Lab Results  Component Value Date   INR 1.01 08/21/2012   INR 0.98 08/18/2012   INR 1.00 02/29/2012    RADIOLOGY: Dg Chest 2 View  12/23/2013   CLINICAL DATA:  History of CHF. Coronary artery disease. Now with acute dyspnea.  EXAM: CHEST  2 VIEW  COMPARISON:  12/22/2013.  FINDINGS: Hazy airspace opacity projects throughout both lungs. There is also interstitial thickening. Heart is mildly enlarged. There are small bilateral effusions. Thickening of the oblique fissures is noted. Findings support congestive heart failure.  Stable left anterior chest wall sequential pacemaker.  IMPRESSION: Congestive heart failure. Mild worsening since the previous day's study.   Electronically Signed   By: Lajean Manes M.D.   On: 12/23/2013 18:50   Dg Chest 2 View  12/22/2013   CLINICAL DATA:  Weakness.  EXAM: CHEST  2 VIEW  COMPARISON:  CT chest 09/05/2013. Plain film of the chest 12/18/2013, 11/20/2013 and 09/05/2013.  FINDINGS: Pacing device remains in place. There are small bilateral pleural effusions. Heart size is enlarged and there is interstitial edema. No pneumothorax identified.  IMPRESSION: Cardiomegaly and mild interstitial edema with associated small pleural effusions.   Electronically Signed   By: Inge Rise M.D.   On: 12/22/2013 15:59   Dg Chest Port 1 View  12/18/2013   CLINICAL DATA:  Chest pain and shortness of breath.  EXAM: PORTABLE CHEST - 1 VIEW  COMPARISON:  Chest radiograph November 20, 2013  FINDINGS: The cardiac silhouette remains mildly enlarged, mediastinal silhouette is nonsuspicious. Central pulmonary vascular congestion with bibasilar patchy  airspace opacities. No pleural effusions. No pneumothorax. Dual lead left cardiac pacemaker in situ. No pneumothorax.  Soft tissue planes and included osseous structures unremarkable.  IMPRESSION: Stable cardiomegaly, increasing central pulmonary vascular congestion with bibasilar patchy airspace opacities which could reflect confluent edema, less likely pneumonia. Recommend followup chest radiograph after treatment to verify improvement.   Electronically Signed   By: Elon Alas   On: 12/18/2013 05:57   ASSESSMENT:  1. Acute on chronic diastolic heart failure, due to rehydration -  He is breathing comfortably lying flat. He put out 2.5L yesterday and is 5L net neg. Weight is down 8lbs.   2. Acute on chronic kidney disease, stage 4. BMET pending this am. 3. Severe pulmonary hypertension  4. PAF maintaining A paced rhythm.  Amio d/c'd due to concern of amio toxicity but Sed rate only mildly elevated so amio restarted.  Plan:  1. Will place back on home dose of Torsemide of 80mg  BID - [name] and I discussed diuretic usage going forward and stated that he was going to need to be kept on the dry side to avoid CHF. 2. [name] had a long discussion with the patient and son informing that he is at end-of-life. Requested decision concerning code status and aggressiveness of care. He recommended DNR and Hospice/Palliative care. He has chosen aggressive alternative stating "I am not ready to die". Plan will be to continue current level of care until the end. He is actually better today and will move to the floor. He has given [name] discretion to remove him from life support if this occurs. 3. No need for right heart cath and not a candidate for angiography.  4. Continue BB and Hydralazine for BP control 5. Continue BB/Amio and Apixipan for PAF   Sueanne Margarita, MD  12/25/2013  7:18 AM

## 2013-12-25 NOTE — Care Management Note (Addendum)
  Page 1 of 1   12/29/2013     3:51:57 PM CARE MANAGEMENT NOTE 12/29/2013  Patient:  [name], [name]   Account Number:  [id number]  Date Initiated:  12/25/2013  Documentation initiated by:  Elissa Hefty  Subjective/Objective Assessment:   adm w dehydration     Action/Plan:   lives w fam,pcp dr Naomie Dean   Anticipated DC Date:     Anticipated DC Plan:  [facility] [place]  CM consult      Choice offered to / List presented to:             Status of service:  Completed, signed off Medicare Important Message given?  YES (If response is "NO", the following Medicare IM given date fields will be blank) Date Medicare IM given:  12/25/2013 Medicare IM given by:  Elissa Hefty Date Additional Medicare IM given:  12/28/2013 Additional Medicare IM given by:  Jonnie Finner  Discharge Disposition:  IP REHAB FACILITY  Per UR Regulation:  Reviewed for med. necessity/level of care/duration of stay  If discussed at [place] of Stay Meetings, dates discussed:   12/29/2013    Comments:  Mariann Laster RN, BSN, MSHL, CCM  Nurse - Case Manager,  ([facility])  [phone]  12/29/2013 Dehydration, CHF Hx/o 4 IP admissions and 4 ER visits over past 6 months. Social:  Hx/o 24/7 caregiver in the home. Disposition Plan:  CIR

## 2013-12-25 NOTE — Progress Notes (Signed)
Received patient at 1630. Was looking at pt.'s medication list. Seen that am torsemide was missed. Contacted MD on call for [facility]. Notified [place] ,[place] . Was told to get BP notify again. Was told to hold dose of torsemide and that patient should still receive evening dose of torsemide tonight.

## 2013-12-26 LAB — GLUCOSE, CAPILLARY
GLUCOSE-CAPILLARY: 330 mg/dL — AB (ref 70–99)
GLUCOSE-CAPILLARY: 287 mg/dL — AB (ref 70–99)
Glucose-Capillary: 179 mg/dL — ABNORMAL HIGH (ref 70–99)
Glucose-Capillary: 146 mg/dL — ABNORMAL HIGH (ref 70–99)

## 2013-12-26 LAB — BASIC METABOLIC PANEL
ANION GAP: 13 (ref 5–15)
BUN: 41 mg/dL — AB (ref 6–23)
CALCIUM: 9.5 mg/dL (ref 8.4–10.5)
CO2: 30 mEq/L (ref 19–32)
CREATININE: 2.16 mg/dL — AB (ref 0.50–1.35)
Chloride: 95 mEq/L — ABNORMAL LOW (ref 96–112)
GFR calc Af Amer: 29 mL/min — ABNORMAL LOW (ref >90)
GFR, EST NON AFRICAN AMERICAN: 25 mL/min — AB (ref >90)
Glucose, Bld: 171 mg/dL — ABNORMAL HIGH (ref 70–99)
Potassium: 4 mEq/L (ref 3.7–5.3)
Sodium: 138 mEq/L (ref 137–147)

## 2013-12-26 MED ORDER — SILODOSIN 8 MG PO CAPS
8.0000 mg | ORAL_CAPSULE | Freq: Every day | ORAL | Status: DC
  Administered 2013-12-26 – 2013-12-28 (×3): 8 mg via ORAL

## 2013-12-26 MED ORDER — TAMSULOSIN HCL 0.4 MG PO CAPS
0.4000 mg | ORAL_CAPSULE | Freq: Every day | ORAL | Status: DC
  Filled 2013-12-26: qty 1

## 2013-12-26 MED ORDER — DUTASTERIDE 0.5 MG PO CAPS
0.5000 mg | ORAL_CAPSULE | Freq: Every day | ORAL | Status: DC
  Administered 2013-12-27 – 2013-12-29 (×3): 0.5 mg via ORAL
  Filled 2013-12-26 (×4): qty 1

## 2013-12-26 NOTE — Progress Notes (Signed)
    Subjective:  Breathing better. No chest pain. He still has Foley catheter. His youngest son is in room.  Objective:  Vital Signs in the last 24 hours: Temp:  [97.9 F (36.6 C)-98.9 F (37.2 C)] 97.9 F (36.6 C) (08/15 0531) Pulse Rate:  [59-65] 64 (08/15 0531) Resp:  [20] 20 (08/15 0531) BP: (119-162)/(53-66) 124/57 mmHg (08/15 0947) SpO2:  [95 %-100 %] 100 % (08/15 0531) Weight:  [192 lb 3.9 oz (87.2 kg)] 192 lb 3.9 oz (87.2 kg) (08/15 0531)  Intake/Output from previous day: 08/14 0701 - 08/15 0700 In: 360 [P.O.:360] Out: 1302 [Urine:1301; Stool:1]   Physical Exam: General: Well developed, well nourished, in no acute distress. Head:  Normocephalic and atraumatic. Lungs: Clear to auscultation and percussion. Heart: Normal S1 and S2.  No murmur, rubs or gallops.  Abdomen: soft, non-tender, positive bowel sounds. Extremities: No clubbing or cyanosis. No edema. Foley catheter Neurologic: Alert and oriented x 3.    Lab Results:   Recent Labs  12/25/13 1255 12/26/13 0351  NA 137 138  K 4.2 4.0  CL 95* 95*  CO2 30 30  GLUCOSE 230* 171*  BUN 39* 41*  CREATININE 2.20* 2.16*    Recent Labs  12/24/13 0142 12/24/13 0825  TROPONINI <0.30 <0.30   Cardiac Studies:  EF 45-50% . allopurinol  200 mg Oral Daily  . amiodarone  200 mg Oral Daily  . antiseptic oral rinse  7 mL Mouth Rinse BID  . apixaban  2.5 mg Oral BID  . ferrous sulfate  325 mg Oral QODAY  . hydrALAZINE  25 mg Oral Q12H  . insulin aspart  0-15 Units Subcutaneous TID WC  . insulin glargine  12 Units Subcutaneous QHS  . isosorbide mononitrate  30 mg Oral Daily  . latanoprost  1 drop Both Eyes QHS  . loratadine  10 mg Oral Daily  . metoprolol tartrate  25 mg Oral Daily  . multivitamin with minerals  1 tablet Oral Daily  . potassium chloride SA  20 mEq Oral Daily  . sodium chloride  3 mL Intravenous Q12H  . torsemide  80 mg Oral BID   Assessment/Plan:  Principal Problem:   Dehydration Active  Problems:   Chronic diastolic CHF (congestive heart failure)   Hypertension   Paroxysmal atrial fibrillation, DCCV 06/16/13   Pacemaker - St Jude   Urinary retention, now with foley cath and leg bag.   CKD (chronic kidney disease) stage 4, GFR 15-29 ml/min   I reviewed previous notes from Dr. Danton Sewer. [name]. We'll continue with home dose torsemide. Seems to be doing fairly well currently. We will try to discontinue his Foley catheter in preparation of discharge soon.  Blood pressure is stable. We may need to pull back on blood pressure medications as time moves on.  SKAINS, [place] 12/26/2013, 1:12 PM

## 2013-12-26 NOTE — Plan of Care (Signed)
Problem: Phase III Progression Outcomes Goal: Pain controlled on oral analgesia Outcome: Progressing No complaints of chest pain.

## 2013-12-27 DIAGNOSIS — I5043 Acute on chronic combined systolic (congestive) and diastolic (congestive) heart failure: Secondary | ICD-10-CM

## 2013-12-27 LAB — BASIC METABOLIC PANEL
Anion gap: 13 (ref 5–15)
BUN: 37 mg/dL — ABNORMAL HIGH (ref 6–23)
CALCIUM: 9.2 mg/dL (ref 8.4–10.5)
CO2: 29 meq/L (ref 19–32)
CREATININE: 1.97 mg/dL — AB (ref 0.50–1.35)
Chloride: 95 mEq/L — ABNORMAL LOW (ref 96–112)
GFR, EST AFRICAN AMERICAN: 32 mL/min — AB (ref >90)
GFR, EST NON AFRICAN AMERICAN: 28 mL/min — AB (ref >90)
Glucose, Bld: 87 mg/dL (ref 70–99)
Potassium: 4.1 mEq/L (ref 3.7–5.3)
Sodium: 137 mEq/L (ref 137–147)

## 2013-12-27 LAB — GLUCOSE, CAPILLARY
GLUCOSE-CAPILLARY: 271 mg/dL — AB (ref 70–99)
GLUCOSE-CAPILLARY: 281 mg/dL — AB (ref 70–99)
Glucose-Capillary: 92 mg/dL (ref 70–99)

## 2013-12-27 NOTE — Plan of Care (Signed)
Problem: Phase I Progression Outcomes Goal: OOB as tolerated unless otherwise ordered Outcome: Progressing Lung sounds clear.  No short of breath noted.

## 2013-12-27 NOTE — Progress Notes (Signed)
    Subjective:  Breathing better. No chest pain. Daughter is present. He is eager for physical therapy.  Objective:  Vital Signs in the last 24 hours: Temp:  [97.6 F (36.4 C)-98.3 F (36.8 C)] 97.6 F (36.4 C) (08/16 0500) Pulse Rate:  [64-66] 64 (08/16 1003) Resp:  [18-20] 20 (08/16 0500) BP: (121-134)/(51-66) 134/51 mmHg (08/16 1003) SpO2:  [98 %] 98 % (08/15 1427) Weight:  [187 lb 9.8 oz (85.1 kg)] 187 lb 9.8 oz (85.1 kg) (08/16 0500)  Intake/Output from previous day: 08/15 0701 - 08/16 0700 In: 180 [P.O.:180] Out: 1425 [Urine:1425]   Physical Exam: General: Elderly, Well developed, well nourished, in no acute distress. Head:  Normocephalic and atraumatic. Lungs: Clear to auscultation and percussion. Heart: Normal S1 and S2.  No significant murmur, rubs or gallops.  Abdomen: soft, non-tender, positive bowel sounds. Mildly protuberant Extremities: No clubbing or cyanosis. No edema. Foley catheter Neurologic: Alert and oriented x 3.    Lab Results:   Recent Labs  12/26/13 0351 12/27/13 0536  NA 138 137  K 4.0 4.1  CL 95* 95*  CO2 30 29  GLUCOSE 171* 87  BUN 41* 37*  CREATININE 2.16* 1.97*    Cardiac Studies:  EF 45-50%  . allopurinol  200 mg Oral Daily  . amiodarone  200 mg Oral Daily  . antiseptic oral rinse  7 mL Mouth Rinse BID  . apixaban  2.5 mg Oral BID  . dutasteride  0.5 mg Oral Daily  . ferrous sulfate  325 mg Oral QODAY  . hydrALAZINE  25 mg Oral Q12H  . insulin aspart  0-15 Units Subcutaneous TID WC  . insulin glargine  12 Units Subcutaneous QHS  . isosorbide mononitrate  30 mg Oral Daily  . latanoprost  1 drop Both Eyes QHS  . loratadine  10 mg Oral Daily  . metoprolol tartrate  25 mg Oral Daily  . multivitamin with minerals  1 tablet Oral Daily  . potassium chloride SA  20 mEq Oral Daily  . silodosin  8 mg Oral Daily  . sodium chloride  3 mL Intravenous Q12H  . torsemide  80 mg Oral BID   Assessment/Plan:   80 year old male with  acute on chronic diastolic/systolic heart failure with recurrent multiple admissions, paroxysmal atrial fibrillation, pacemaker, chronic kidney disease stage IV.  1. Acute on chronic systolic/diastolic heart failure-he is improving. Continuing with torsemide by mouth 80 mg twice a day. Potassium is 4.1. Creatinine 1.97, improving. I reviewed previous notes from Dr. Danton Sewer. [name]. Discussion of end-of-life.  2. Deconditioning-physical therapy. He's eager to have this.  3. Hypertension-currently well controlled, stable.  4. Pacemaker-functioning well.  5. Chronic kidney disease stage IV-improved creatinine.  6. Paroxysmal atrial fibrillation/chronic anticoagulation-continue with low-dose Eliquis.  Anticipate discharge tomorrow. I discussed potential discharge today however he was quite adamant about staying until tomorrow.   Danali Marinos, [place] 12/27/2013, 11:00 AM

## 2013-12-27 NOTE — Progress Notes (Signed)
Patient is oriented x 4. Denies complaint of pain/discomfort, and is resting peacefully at preset time. Encouraged patient to notify this nurse with any needs and/or concerns to which he verbalized understanding. Will continue to monitor.  Esperanza Heir, RN

## 2013-12-28 DIAGNOSIS — I5043 Acute on chronic combined systolic (congestive) and diastolic (congestive) heart failure: Secondary | ICD-10-CM

## 2013-12-28 DIAGNOSIS — I4891 Unspecified atrial fibrillation: Secondary | ICD-10-CM

## 2013-12-28 DIAGNOSIS — I10 Essential (primary) hypertension: Secondary | ICD-10-CM

## 2013-12-28 LAB — BASIC METABOLIC PANEL
ANION GAP: 10 (ref 5–15)
BUN: 38 mg/dL — ABNORMAL HIGH (ref 6–23)
CALCIUM: 9.4 mg/dL (ref 8.4–10.5)
CO2: 32 mEq/L (ref 19–32)
Chloride: 95 mEq/L — ABNORMAL LOW (ref 96–112)
Creatinine, Ser: 1.97 mg/dL — ABNORMAL HIGH (ref 0.50–1.35)
GFR, EST AFRICAN AMERICAN: 32 mL/min — AB (ref >90)
GFR, EST NON AFRICAN AMERICAN: 28 mL/min — AB (ref >90)
GLUCOSE: 83 mg/dL (ref 70–99)
POTASSIUM: 3.8 meq/L (ref 3.7–5.3)
Sodium: 137 mEq/L (ref 137–147)

## 2013-12-28 LAB — GLUCOSE, CAPILLARY
GLUCOSE-CAPILLARY: 84 mg/dL (ref 70–99)
GLUCOSE-CAPILLARY: 231 mg/dL — AB (ref 70–99)
GLUCOSE-CAPILLARY: 248 mg/dL — AB (ref 70–99)
Glucose-Capillary: 169 mg/dL — ABNORMAL HIGH (ref 70–99)

## 2013-12-28 NOTE — Progress Notes (Signed)
Rehab Admissions Coordinator Note:  Patient was screened by Tremond Shimabukuro L for appropriateness for an Inpatient Acute Rehab Consult.  At this time, we are recommending Inpatient Rehab consult.  Deja Kaigler, PT Rehabilitation Admissions Coordinator [phone]  

## 2013-12-28 NOTE — Progress Notes (Signed)
12/28/2013 1406  Date Additional Medicare IM given: 12/28/2013 Additional Medicare IM given by:  Jonnie Finner Jonnie Finner RN CCM Case Mgmt phone [phone]

## 2013-12-28 NOTE — Progress Notes (Signed)
Attempted to start an IV to left arm. Attempt not successful. Will call IV team

## 2013-12-28 NOTE — Progress Notes (Signed)
SUBJECTIVE:  Feels well. No SHOB.  Is somewhat disappointed that he did not have PT over the weekend.  He feels ok but is somewhat nervous about going home without a PT eval and HHPT, as he has received in the past.  OBJECTIVE:   Vitals:   Filed Vitals:   12/27/13 1003 12/27/13 1331 12/27/13 2056 12/28/13 0438  BP: 134/51 125/53 127/56 133/57  Pulse: 64 64 65 69  Temp:  98 F (36.7 C) 97.8 F (36.6 C) 97.7 F (36.5 C)  TempSrc:  Oral Oral Oral  Resp:  18 18 18   Height:      Weight:    186 lb 4.6 oz (84.5 kg)  SpO2:  99% 95% 95%   I&O's:   Intake/Output Summary (Last 24 hours) at 12/28/13 0848 Last data filed at 12/28/13 0442  Gross per 24 hour  Intake    840 ml  Output    825 ml  Net     15 ml        PHYSICAL EXAM General: Well developed, well nourished, in no acute distress Head:   Normal cephalic and atramatic  Lungs:   Scant crackles at the right base. Heart:   HRRR S1 S2  No JVD.   Abdomen: abdomen soft and non-tender Msk:  Back normal,  Normal strength and tone for age. Extremities:   No edema.   Neuro: Alert and oriented. Psych:  Normal affect, responds appropriately   LABS: Basic Metabolic Panel:  Recent Labs  12/27/13 0536 12/28/13 0405  NA 137 137  K 4.1 3.8  CL 95* 95*  CO2 29 32  GLUCOSE 87 83  BUN 37* 38*  CREATININE 1.97* 1.97*  CALCIUM 9.2 9.4   Liver Function Tests: No results found for this basename: AST, ALT, ALKPHOS, BILITOT, PROT, ALBUMIN,  in the last 72 hours No results found for this basename: LIPASE, AMYLASE,  in the last 72 hours CBC: No results found for this basename: WBC, NEUTROABS, HGB, HCT, MCV, PLT,  in the last 72 hours Cardiac Enzymes: No results found for this basename: CKTOTAL, CKMB, CKMBINDEX, TROPONINI,  in the last 72 hours BNP: No components found with this basename: POCBNP,  D-Dimer: No results found for this basename: DDIMER,  in the last 72 hours Hemoglobin A1C: No results found for this basename:  HGBA1C,  in the last 72 hours Fasting Lipid Panel: No results found for this basename: CHOL, HDL, LDLCALC, TRIG, CHOLHDL, LDLDIRECT,  in the last 72 hours Thyroid Function Tests: No results found for this basename: TSH, T4TOTAL, FREET3, T3FREE, THYROIDAB,  in the last 72 hours Anemia Panel: No results found for this basename: VITAMINB12, FOLATE, FERRITIN, TIBC, IRON, RETICCTPCT,  in the last 72 hours Coag Panel:   Lab Results  Component Value Date   INR 1.01 08/21/2012   INR 0.98 08/18/2012   INR 1.00 02/29/2012    RADIOLOGY: Dg Chest 2 View  12/23/2013   CLINICAL DATA:  History of CHF. Coronary artery disease. Now with acute dyspnea.  EXAM: CHEST  2 VIEW  COMPARISON:  12/22/2013.  FINDINGS: Hazy airspace opacity projects throughout both lungs. There is also interstitial thickening. Heart is mildly enlarged. There are small bilateral effusions. Thickening of the oblique fissures is noted. Findings support congestive heart failure.  Stable left anterior chest wall sequential pacemaker.  IMPRESSION: Congestive heart failure. Mild worsening since the previous day's study.   Electronically Signed   By: Lajean Manes M.D.   On: 12/23/2013  18:50   Dg Chest 2 View  12/22/2013   CLINICAL DATA:  Weakness.  EXAM: CHEST  2 VIEW  COMPARISON:  CT chest 09/05/2013. Plain film of the chest 12/18/2013, 11/20/2013 and 09/05/2013.  FINDINGS: Pacing device remains in place. There are small bilateral pleural effusions. Heart size is enlarged and there is interstitial edema. No pneumothorax identified.  IMPRESSION: Cardiomegaly and mild interstitial edema with associated small pleural effusions.   Electronically Signed   By: Inge Rise M.D.   On: 12/22/2013 15:59   Dg Chest Port 1 View  12/18/2013   CLINICAL DATA:  Chest pain and shortness of breath.  EXAM: PORTABLE CHEST - 1 VIEW  COMPARISON:  Chest radiograph November 20, 2013  FINDINGS: The cardiac silhouette remains mildly enlarged, mediastinal silhouette is  nonsuspicious. Central pulmonary vascular congestion with bibasilar patchy airspace opacities. No pleural effusions. No pneumothorax. Dual lead left cardiac pacemaker in situ. No pneumothorax.  Soft tissue planes and included osseous structures unremarkable.  IMPRESSION: Stable cardiomegaly, increasing central pulmonary vascular congestion with bibasilar patchy airspace opacities which could reflect confluent edema, less likely pneumonia. Recommend followup chest radiograph after treatment to verify improvement.   Electronically Signed   By: Elon Alas   On: 12/18/2013 05:57      ASSESSMENT/PLAN:   80 year old male with acute on chronic diastolic/systolic heart failure with recurrent multiple admissions, paroxysmal atrial fibrillation, pacemaker, chronic kidney disease stage IV.  1. Acute on chronic systolic/diastolic heart failure-he is improving. Continuing with torsemide by mouth 80 mg twice a day. Potassium is 4.1. Creatinine 1.97, stable.  I reviewed previous notes from Dr. Danton Sewer. [name].   2. Deconditioning-physical therapy. He's eager to have this. Discharge is dependent on this. 3. Hypertension-currently well controlled, stable.  4. Pacemaker-functioning well.  5. Chronic kidney disease stage IV-stable creatinine.  6. Paroxysmal atrial fibrillation/chronic anticoagulation-continue with low-dose Eliquis.  Anticipate discharge today or tomorrow. I discussed potential discharge today however he was quite adamant about staying until a full PT eval and not going home too late in the day.   Jettie Booze., MD  12/28/2013  8:48 AM

## 2013-12-28 NOTE — Progress Notes (Signed)
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 He was d/c'd home and presents a day later with profound weakness and a fall at home.    PT Comments    Pt not seen for therapy for several days, therefore PA ordering new PT eval to determine if pt safe to D/C home today.  Pt very agreeable to PT session with caregiver present.  Requires min/mod A for gait with RW up to 70', however pt states that he has extremely long hallway at home to get to bedroom.  Also note that he has 2 STE and 2 more steps inside of house.  Pt requires mod A for stairs.  Note he has "access to 24/7 S/assist" but no one will be with him 24/7, therefore recommend pt get follow up rehab at CIR before D/C home to increase pts functional independence and decrease fall risk.  RN made aware, CIR admissions coordinator called.    Follow Up Recommendations  CIR;Supervision/Assistance - 24 hour     Equipment Recommendations  Rolling walker with 5" wheels    Recommendations for Other Services Rehab consult     Precautions / Restrictions Precautions Precautions: Fall Required Braces or Orthoses: Other Brace/Splint;Spinal Brace Spinal Brace: Lumbar corset;Applied in sitting position;Other (comment) Spinal Brace Comments: long standing for comfort grossly 25years Other Brace/Splint: AFO LLE (From old knee sx) Restrictions Weight Bearing Restrictions: No    Mobility  Bed Mobility Overal bed mobility:  (Pt in recliner when PT arrived. )                Transfers Overall transfer level: Needs assistance Equipment used: Rolling walker (2 wheeled) Transfers: Sit to/from Stand Sit to Stand: Min assist         General transfer comment: Requires assist for increased forward weight shift/trunk lean with  mod cues for hand placement and safety when sitting/standing.   Ambulation/Gait Ambulation/Gait assistance: Min assist;Mod assist Ambulation Distance (Feet): 70 Feet Assistive device: Rolling walker (2 wheeled) Gait Pattern/deviations: Step-through pattern;Decreased stride length;Trunk flexed Gait velocity: decreased   General Gait Details: Pt continues to have decreased gait speed and generalized weakness, requiring min/mod assist for gait for safety.  Note that knees did not buckle during gait with use of RW, however did note quad weakness in ability to extend knees during stance phase of gait.    Stairs Stairs: Yes Stairs assistance: Mod assist Stair Management: One rail Right;Step to pattern;Backwards Number of Stairs: 6 General stair comments: Pt has 2 STE home with B handrails but also another 2 steps inside home with single handrail.  Requires mod A for stair negotiation with single handrail and PT provided HHA on LUE for stability.     Wheelchair Mobility    Modified Rankin (Stroke Patients Only)       Balance Overall balance assessment: Needs assistance Sitting-balance support: Feet supported Sitting balance-Leahy Scale: Fair   Postural control: Posterior lean Standing balance support: During functional activity;Bilateral upper extremity supported Standing balance-Leahy Scale: Poor Standing balance comment: Requires min A to maintain balance with use of RW.                     Cognition Arousal/Alertness: Awake/alert Behavior During Therapy: WFL for tasks assessed/performed Overall Cognitive Status: Within Functional Limits for tasks assessed  Exercises      General Comments        Pertinent Vitals/Pain Pain Assessment: No/denies pain    Home Living                      Prior Function            PT Goals (current goals can now be found in the care plan section) Acute Rehab PT Goals Patient Stated Goal:  return to work PT Goal Formulation: With patient Time For Goal Achievement: 01/02/14 Potential to Achieve Goals: Good Progress towards PT goals: Progressing toward goals    Frequency  Min 3X/week    PT Plan Discharge plan needs to be updated    Co-evaluation             End of Session Equipment Utilized During Treatment: Gait belt;Back brace Activity Tolerance: Patient tolerated treatment well Patient left: in chair;with call bell/phone within reach;with family/visitor present     Time: 7544-9201 PT Time Calculation (min): 33 min  Charges:  $Gait Training: 23-37 mins                    G Codes:      Denice Bors 12/28/2013, 10:28 AM

## 2013-12-29 ENCOUNTER — Encounter (HOSPITAL_COMMUNITY): Payer: Self-pay | Admitting: Nurse Practitioner

## 2013-12-29 ENCOUNTER — Inpatient Hospital Stay (HOSPITAL_COMMUNITY)
Admission: RE | Admit: 2013-12-29 | Discharge: 2014-01-02 | DRG: 945 | Disposition: A | Discharge status: Home / Self Care | Payer: Medicare Other | Source: Intra-hospital | Attending: Physical Medicine & Rehabilitation | Admitting: Physical Medicine & Rehabilitation

## 2013-12-29 DIAGNOSIS — Z950 Presence of cardiac pacemaker: Secondary | ICD-10-CM | POA: Diagnosis not present

## 2013-12-29 DIAGNOSIS — Z8673 Personal history of transient ischemic attack (TIA), and cerebral infarction without residual deficits: Secondary | ICD-10-CM

## 2013-12-29 DIAGNOSIS — M129 Arthropathy, unspecified: Secondary | ICD-10-CM

## 2013-12-29 DIAGNOSIS — N189 Chronic kidney disease, unspecified: Secondary | ICD-10-CM

## 2013-12-29 DIAGNOSIS — I129 Hypertensive chronic kidney disease with stage 1 through stage 4 chronic kidney disease, or unspecified chronic kidney disease: Secondary | ICD-10-CM

## 2013-12-29 DIAGNOSIS — Z5189 Encounter for other specified aftercare: Secondary | ICD-10-CM | POA: Diagnosis present

## 2013-12-29 DIAGNOSIS — E785 Hyperlipidemia, unspecified: Secondary | ICD-10-CM

## 2013-12-29 DIAGNOSIS — Z7901 Long term (current) use of anticoagulants: Secondary | ICD-10-CM | POA: Diagnosis not present

## 2013-12-29 DIAGNOSIS — E1142 Type 2 diabetes mellitus with diabetic polyneuropathy: Secondary | ICD-10-CM | POA: Diagnosis present

## 2013-12-29 DIAGNOSIS — Z794 Long term (current) use of insulin: Secondary | ICD-10-CM | POA: Diagnosis not present

## 2013-12-29 DIAGNOSIS — I5033 Acute on chronic diastolic (congestive) heart failure: Secondary | ICD-10-CM | POA: Diagnosis not present

## 2013-12-29 DIAGNOSIS — I739 Peripheral vascular disease, unspecified: Secondary | ICD-10-CM | POA: Diagnosis not present

## 2013-12-29 DIAGNOSIS — I4891 Unspecified atrial fibrillation: Secondary | ICD-10-CM

## 2013-12-29 DIAGNOSIS — N184 Chronic kidney disease, stage 4 (severe): Secondary | ICD-10-CM | POA: Diagnosis not present

## 2013-12-29 DIAGNOSIS — I6340 Cerebral infarction due to embolism of unspecified cerebral artery: Secondary | ICD-10-CM | POA: Diagnosis not present

## 2013-12-29 DIAGNOSIS — Z96659 Presence of unspecified artificial knee joint: Secondary | ICD-10-CM

## 2013-12-29 DIAGNOSIS — Z807 Family history of other malignant neoplasms of lymphoid, hematopoietic and related tissues: Secondary | ICD-10-CM

## 2013-12-29 DIAGNOSIS — I2510 Atherosclerotic heart disease of native coronary artery without angina pectoris: Secondary | ICD-10-CM

## 2013-12-29 DIAGNOSIS — I509 Heart failure, unspecified: Secondary | ICD-10-CM

## 2013-12-29 DIAGNOSIS — Z9849 Cataract extraction status, unspecified eye: Secondary | ICD-10-CM | POA: Diagnosis not present

## 2013-12-29 DIAGNOSIS — R5381 Other malaise: Secondary | ICD-10-CM | POA: Diagnosis present

## 2013-12-29 DIAGNOSIS — N179 Acute kidney failure, unspecified: Secondary | ICD-10-CM

## 2013-12-29 DIAGNOSIS — Z87891 Personal history of nicotine dependence: Secondary | ICD-10-CM

## 2013-12-29 DIAGNOSIS — E119 Type 2 diabetes mellitus without complications: Secondary | ICD-10-CM

## 2013-12-29 DIAGNOSIS — Z79899 Other long term (current) drug therapy: Secondary | ICD-10-CM | POA: Diagnosis not present

## 2013-12-29 DIAGNOSIS — Z8249 Family history of ischemic heart disease and other diseases of the circulatory system: Secondary | ICD-10-CM

## 2013-12-29 DIAGNOSIS — I119 Hypertensive heart disease without heart failure: Secondary | ICD-10-CM | POA: Diagnosis present

## 2013-12-29 DIAGNOSIS — E876 Hypokalemia: Secondary | ICD-10-CM | POA: Diagnosis not present

## 2013-12-29 DIAGNOSIS — N183 Chronic kidney disease, stage 3 unspecified: Secondary | ICD-10-CM

## 2013-12-29 LAB — GLUCOSE, CAPILLARY
GLUCOSE-CAPILLARY: 195 mg/dL — AB (ref 70–99)
Glucose-Capillary: 176 mg/dL — ABNORMAL HIGH (ref 70–99)
Glucose-Capillary: 243 mg/dL — ABNORMAL HIGH (ref 70–99)
Glucose-Capillary: 314 mg/dL — ABNORMAL HIGH (ref 70–99)
Glucose-Capillary: 262 mg/dL — ABNORMAL HIGH (ref 70–99)
Glucose-Capillary: 216 mg/dL — ABNORMAL HIGH (ref 70–99)

## 2013-12-29 MED ORDER — AMIODARONE HCL 200 MG PO TABS
200.0000 mg | ORAL_TABLET | Freq: Every day | ORAL | Status: DC
  Administered 2013-12-30: 200 mg via ORAL
  Filled 2013-12-29 (×3): qty 1

## 2013-12-29 MED ORDER — PROCHLORPERAZINE 25 MG RE SUPP
12.5000 mg | Freq: Four times a day (QID) | RECTAL | Status: DC | PRN
  Filled 2013-12-29: qty 1

## 2013-12-29 MED ORDER — GUAIFENESIN-DM 100-10 MG/5ML PO SYRP: 5.0000 mL | ORAL_SOLUTION | Freq: Four times a day (QID) | ORAL | Status: DC | PRN

## 2013-12-29 MED ORDER — DUTASTERIDE 0.5 MG PO CAPS
0.5000 mg | ORAL_CAPSULE | Freq: Every day | ORAL | Status: DC
  Administered 2013-12-30 – 2014-01-02 (×4): 0.5 mg via ORAL
  Filled 2013-12-29 (×5): qty 1

## 2013-12-29 MED ORDER — PROCHLORPERAZINE EDISYLATE 5 MG/ML IJ SOLN
5.0000 mg | Freq: Four times a day (QID) | INTRAMUSCULAR | Status: DC | PRN
  Filled 2013-12-29: qty 2

## 2013-12-29 MED ORDER — LORATADINE 10 MG PO TABS
10.0000 mg | ORAL_TABLET | Freq: Every day | ORAL | Status: DC
  Administered 2013-12-30 – 2014-01-02 (×4): 10 mg via ORAL
  Filled 2013-12-29 (×5): qty 1

## 2013-12-29 MED ORDER — FLEET ENEMA 7-19 GM/118ML RE ENEM
1.0000 | ENEMA | Freq: Once | RECTAL | Status: AC | PRN
Start: 2013-12-29 — End: 2013-12-29

## 2013-12-29 MED ORDER — BISACODYL 10 MG RE SUPP
10.0000 mg | Freq: Every day | RECTAL | Status: DC | PRN
Start: 2013-12-29 — End: 2014-01-02

## 2013-12-29 MED ORDER — CETYLPYRIDINIUM CHLORIDE 0.05 % MT LIQD
7.0000 mL | Freq: Two times a day (BID) | OROMUCOSAL | Status: DC
  Administered 2013-12-29 – 2014-01-01 (×7): 7 mL via OROMUCOSAL

## 2013-12-29 MED ORDER — ISOSORBIDE MONONITRATE ER 30 MG PO TB24
30.0000 mg | ORAL_TABLET | Freq: Every day | ORAL | Status: DC
  Administered 2013-12-30 – 2014-01-02 (×4): 30 mg via ORAL
  Filled 2013-12-29 (×5): qty 1

## 2013-12-29 MED ORDER — SILODOSIN 8 MG PO CAPS
8.0000 mg | ORAL_CAPSULE | Freq: Every day | ORAL | Status: DC
  Administered 2013-12-29 – 2014-01-01 (×4): 8 mg via ORAL

## 2013-12-29 MED ORDER — LATANOPROST 0.005 % OP SOLN
1.0000 [drp] | Freq: Every day | OPHTHALMIC | Status: DC
  Filled 2013-12-29: qty 2.5

## 2013-12-29 MED ORDER — POLYETHYLENE GLYCOL 3350 17 G PO PACK
17.0000 g | PACK | Freq: Two times a day (BID) | ORAL | Status: DC | PRN
  Administered 2013-12-29 – 2014-01-01 (×5): 17 g via ORAL
  Filled 2013-12-29 (×3): qty 1

## 2013-12-29 MED ORDER — INSULIN GLARGINE 100 UNIT/ML ~~LOC~~ SOLN
12.0000 [IU] | Freq: Every day | SUBCUTANEOUS | Status: DC
  Administered 2013-12-29 – 2014-01-01 (×4): 12 [IU] via SUBCUTANEOUS
  Filled 2013-12-29 (×5): qty 0.12

## 2013-12-29 MED ORDER — HYDRALAZINE HCL 25 MG PO TABS
25.0000 mg | ORAL_TABLET | Freq: Two times a day (BID) | ORAL | Status: DC
  Administered 2013-12-29 – 2014-01-02 (×8): 25 mg via ORAL
  Filled 2013-12-29 (×10): qty 1

## 2013-12-29 MED ORDER — FERROUS SULFATE 325 (65 FE) MG PO TABS
325.0000 mg | ORAL_TABLET | ORAL | Status: DC
  Administered 2013-12-30 – 2014-01-01 (×2): 325 mg via ORAL
  Filled 2013-12-29 (×2): qty 1

## 2013-12-29 MED ORDER — ADULT MULTIVITAMIN W/MINERALS CH
1.0000 | ORAL_TABLET | Freq: Every day | ORAL | Status: DC
  Administered 2013-12-30 – 2014-01-02 (×4): 1 via ORAL
  Filled 2013-12-29 (×5): qty 1

## 2013-12-29 MED ORDER — POLYVINYL ALCOHOL 1.4 % OP SOLN
1.0000 [drp] | OPHTHALMIC | Status: DC | PRN
  Filled 2013-12-29: qty 15

## 2013-12-29 MED ORDER — POTASSIUM CHLORIDE CRYS ER 20 MEQ PO TBCR
20.0000 meq | EXTENDED_RELEASE_TABLET | Freq: Every day | ORAL | Status: DC
  Administered 2014-01-01 – 2014-01-02 (×2): 20 meq via ORAL
  Filled 2013-12-29 (×5): qty 1

## 2013-12-29 MED ORDER — ALLOPURINOL 100 MG PO TABS
200.0000 mg | ORAL_TABLET | Freq: Every day | ORAL | Status: DC
  Administered 2013-12-30 – 2014-01-02 (×4): 200 mg via ORAL
  Filled 2013-12-29 (×6): qty 2

## 2013-12-29 MED ORDER — PROCHLORPERAZINE MALEATE 5 MG PO TABS
5.0000 mg | ORAL_TABLET | Freq: Four times a day (QID) | ORAL | Status: DC | PRN
  Filled 2013-12-29: qty 2

## 2013-12-29 MED ORDER — TORSEMIDE 20 MG PO TABS
80.0000 mg | ORAL_TABLET | Freq: Two times a day (BID) | ORAL | Status: DC
  Administered 2013-12-29 – 2014-01-02 (×8): 80 mg via ORAL
  Filled 2013-12-29 (×11): qty 4

## 2013-12-29 MED ORDER — ACETAMINOPHEN 325 MG PO TABS: 325.0000 mg | ORAL_TABLET | ORAL | Status: DC | PRN

## 2013-12-29 MED ORDER — NITROGLYCERIN 0.4 MG SL SUBL: 0.4000 mg | SUBLINGUAL_TABLET | SUBLINGUAL | Status: DC | PRN

## 2013-12-29 MED ORDER — INSULIN ASPART 100 UNIT/ML ~~LOC~~ SOLN
0.0000 [IU] | Freq: Three times a day (TID) | SUBCUTANEOUS | Status: DC
  Administered 2013-12-29 – 2013-12-30 (×2): 8 [IU] via SUBCUTANEOUS
  Administered 2013-12-30: 3 [IU] via SUBCUTANEOUS
  Administered 2013-12-31: 5 [IU] via SUBCUTANEOUS
  Administered 2014-01-01: 8 [IU] via SUBCUTANEOUS
  Administered 2014-01-01: 3 [IU] via SUBCUTANEOUS
  Administered 2014-01-02: 2 [IU] via SUBCUTANEOUS

## 2013-12-29 MED ORDER — METOPROLOL TARTRATE 25 MG PO TABS
25.0000 mg | ORAL_TABLET | Freq: Every day | ORAL | Status: DC
  Administered 2013-12-30 – 2013-12-31 (×2): 25 mg via ORAL
  Filled 2013-12-29 (×4): qty 1

## 2013-12-29 MED ORDER — APIXABAN 2.5 MG PO TABS
2.5000 mg | ORAL_TABLET | Freq: Two times a day (BID) | ORAL | Status: DC
  Administered 2013-12-29 – 2014-01-02 (×8): 2.5 mg via ORAL
  Filled 2013-12-29 (×10): qty 1

## 2013-12-29 NOTE — Progress Notes (Signed)
Pt refused lab draw this AM, lab draw re-scheduled per lab. Pt refused IV re-insertion at this time. IV team to return at a later time.

## 2013-12-29 NOTE — Discharge Instructions (Signed)

## 2013-12-29 NOTE — Progress Notes (Signed)
Pt IV out of date, IV team notified and unable to get IV access, per IV team, will have second RN try later in night/AM. Pt right hand IV flushed and leaking/burning. IV removed.

## 2013-12-29 NOTE — Discharge Summary (Signed)
Discharge Summary   Patient ID: [name] MRN: [id number], DOB/AGE: Oct 10, 1918 80 y.o. Admit date: 12/22/2013 D/C date:     12/29/2013  Primary Cardiologist: [name]  Principal Problem:   Dehydration Active Problems:   Acute on chronic combined systolic and diastolic congestive heart failure   Hypertension   CKD (chronic kidney disease), stage III   Paroxysmal atrial fibrillation, DCCV 06/16/13   Pacemaker - [employer]   Urinary retention, now with foley cath and leg bag.   Acute on chronic renal failure   CKD (chronic kidney disease) stage 4, GFR 15-29 ml/min   Solitary pulmonary nodule   Admission Dates: 12/22/2013 - 12/29/13 Discharge Diagnosis: Dehydration c/b acute on chronic systolic/diastolic CHF with IVFs- discharge weight 186lbs  HPI: [name] is a 80 y.o. male with history of DM, CAD, severe pulmonary HTN, CKD, A fib, CHF with recurrent multiple admissions who was admitted on 12/22/13 with generalized weakness, fall and dehydration. He was treated with IVFs and then developed acute on chronic CHF requiring diuresis.    Hospital Course: He was admitted for increase in dyspnea and angina 08/7-08/10/15 and then readmitted on 08/11/125 with generalized weakness, fall and dehydration. He was treated with IVF but developed acute on chronic diastolic HF due to rehydration. Foley placed due to recurrent urinary retention. He responded to IV diuresis and end of life issues discussed with patient and family. Patient has chosen aggressive treatment. SOB improving on current regimen with BP controlled and chronic CKD stable. Patient deconditioned and CIR recommended by MD and therapy team.   Acute on chronic systolic/diastolic heart failure -- 2D ECHO on 12/23/13 with EF 45-50%: compared to the prior study on 06/15/13 right ventricular systolic function is now mildly impaired and there is severe pulmonary hypertension PASP 73 mmHg.  -- Net neg 7.6 L on IV Lasix and torsemide. Weight  down 7 lbs. 193--> 186 on the day of discharge -- Continuing with torsemide by mouth 80 mg twice a day.  -- K 4.1. Creatinine 1.97, stable.   Deconditioning-physical therapy. Being discharged to inpatient rehab.   Hypertension-currently well controlled, stable.   Pacemaker-functioning well.   Chronic kidney disease stage IV-stable creatinine.   Paroxysmal atrial fibrillation/chronic anticoagulation-continue with low-dose Eliquis and amiodarone 200 mg qd   The patient has had an uncomplicated hospital course and is recovering well. He has been seen by Dr. Irish Lack today and deemed ready for discharge to inpatient rehab at [facility]. He has a previously scheduled appointment with [name] next week.  Discharge medications are listed below.   Discharge Vitals: Blood pressure 122/56, pulse 60, temperature 97.8 F (36.6 C), temperature source Oral, resp. rate 18, height 5\' 7"  (1.702 m), weight 186 lb 11.2 oz (84.687 kg), SpO2 98.00%.  Labs: Lab Results  Component Value Date   WBC 7.1 12/22/2013   HGB 11.5* 12/22/2013   HCT 34.8* 12/22/2013   MCV 100.9* 12/22/2013   PLT 193 12/22/2013    Recent Labs Lab 12/23/13 0535  12/28/13 0405  NA 135*  < > 137  K 4.5  < > 3.8  CL 93*  < > 95*  CO2 26  < > 32  BUN 45*  < > 38*  CREATININE 2.59*  < > 1.97*  CALCIUM 9.3  < > 9.4  PROT 7.2  --   --   BILITOT 0.4  --   --   ALKPHOS 66  --   --   ALT 42  --   --  AST 48*  --   --   GLUCOSE 226*  < > 83  < > = values in this interval not displayed.    Diagnostic Studies/Procedures   Dg Chest 2 View  12/23/2013   CLINICAL DATA:  History of CHF. Coronary artery disease. Now with acute dyspnea.  EXAM: CHEST  2 VIEW  COMPARISON:  12/22/2013.  FINDINGS: Hazy airspace opacity projects throughout both lungs. There is also interstitial thickening. Heart is mildly enlarged. There are small bilateral effusions. Thickening of the oblique fissures is noted. Findings support congestive heart failure.  Stable  left anterior chest wall sequential pacemaker.  IMPRESSION: Congestive heart failure. Mild worsening since the previous day's study.   Dg Chest 2 View  12/22/2013   CLINICAL DATA:  Weakness.  EXAM: CHEST  2 VIEW  COMPARISON:  CT chest 09/05/2013. Plain film of the chest 12/18/2013, 11/20/2013 and 09/05/2013.  FINDINGS: Pacing device remains in place. There are small bilateral pleural effusions. Heart size is enlarged and there is interstitial edema. No pneumothorax identified.  IMPRESSION: Cardiomegaly and mild interstitial edema with associated small pleural effusions.     Dg Chest Port 1 View  12/18/2013   CLINICAL DATA:  Chest pain and shortness of breath.  EXAM: PORTABLE CHEST - 1 VIEW  COMPARISON:  Chest radiograph November 20, 2013  FINDINGS: The cardiac silhouette remains mildly enlarged, mediastinal silhouette is nonsuspicious. Central pulmonary vascular congestion with bibasilar patchy airspace opacities. No pleural effusions. No pneumothorax. Dual lead left cardiac pacemaker in situ. No pneumothorax.  Soft tissue planes and included osseous structures unremarkable.  IMPRESSION: Stable cardiomegaly, increasing central pulmonary vascular congestion with bibasilar patchy airspace opacities which could reflect confluent edema, less likely pneumonia. Recommend followup chest radiograph after treatment to verify improvement.    Study Date: 12/23/2013 LV EF: 45% - 50% Indications: CHF - 428.0. Study Conclusions - Left ventricle: The cavity size was normal. There was moderate concentric hypertrophy. Systolic function was mildly reduced. The estimated ejection fraction was in the range of 45% to 50%. Wall motion was normal; there were no regional wall motion abnormalities. - Aortic valve: Trileaflet; moderately thickened, moderately calcified leaflets. Cusp separation was mildly reduced. Sclerosis without stenosis. Transvalvular velocity was minimally increased. There was no significant  regurgitation. Valve area (VTI): 1.27 cm^2. Valve area (Vmax): 1.31 cm^2. - Aortic root: The aortic root was normal in size. - Ascending aorta: The ascending aorta was normal in size. - Mitral valve: There was mild regurgitation. - Left atrium: The atrium was mildly dilated. - Right ventricle: The cavity size was moderately dilated. Wall thickness was normal. Systolic function was mildly reduced. - Right atrium: The atrium was moderately dilated. - Pulmonary arteries: Systolic pressure was severely increased. PA peak pressure: 76 mm Hg (S). Impressions: - Compared to the prior study on 06/15/13 right ventricular systolic function is now mildly impaired and there is severe pulmonary hypertension PASP 73 mmHg.     Discharge Medications     Medication List         allopurinol 100 MG tablet  Commonly known as:  ZYLOPRIM  Take 200 mg by mouth daily.     amiodarone 200 MG tablet  Commonly known as:  PACERONE  Take 200 mg by mouth daily.     BL FLAX SEED OIL PO  Take 1 capsule by mouth 2 (two) times daily.     dutasteride 0.5 MG capsule  Commonly known as:  AVODART  Take 0.5 mg by mouth daily.  ELIQUIS 2.5 MG Tabs tablet  Generic drug:  apixaban  Take 2.5 mg by mouth 2 (two) times daily.     ferrous sulfate 325 (65 FE) MG tablet  Take 325 mg by mouth every other day.     fexofenadine 180 MG tablet  Commonly known as:  ALLEGRA  Take 180 mg by mouth daily.     hydrALAZINE 25 MG tablet  Commonly known as:  APRESOLINE  Take 25 mg by mouth every 12 (twelve) hours.     insulin glargine 100 UNIT/ML injection  Commonly known as:  LANTUS  Inject 12 Units into the skin at bedtime.     isosorbide mononitrate 60 MG 24 hr tablet  Commonly known as:  IMDUR  Take 1 tablet (60 mg total) by mouth daily.     metoprolol tartrate 25 MG tablet  Commonly known as:  LOPRESSOR  Take 1 tablet (25 mg total) by mouth daily.     multivitamin with minerals Tabs tablet  Take 1 tablet  by mouth daily. Diabetic pak by Petra Kuba from [facility]     nitroGLYCERIN 0.4 MG SL tablet  Commonly known as:  NITROSTAT  Place 1 tablet (0.4 mg total) under the tongue every 5 (five) minutes as needed for chest pain.     NOVOLOG FLEXPEN 100 UNIT/ML FlexPen  Generic drug:  insulin aspart  Inject 2-9 Units into the skin 3 (three) times daily with meals. Sliding scale     polyethylene glycol packet  Commonly known as:  MIRALAX / GLYCOLAX  Take 17 g by mouth 2 (two) times daily as needed for mild constipation. For constipation     potassium chloride SA 20 MEQ tablet  Commonly known as:  K-DUR,KLOR-CON  Take 1 tablet (20 mEq total) by mouth daily.     ranolazine 500 MG 12 hr tablet  Commonly known as:  RANEXA  Take 1 tablet (500 mg total) by mouth 2 (two) times daily.     REFRESH OP  Place 1 drop into both eyes 2 (two) times daily. Refresh Gel Opth     silodosin 8 MG Caps capsule  Commonly known as:  RAPAFLO  Take 8 mg by mouth daily with breakfast.     SYSTANE 0.4-0.3 % Soln  Generic drug:  Polyethyl Glycol-Propyl Glycol  Place 1 drop into both eyes daily as needed (dry eyes). Into affected eyes     torsemide 20 MG tablet  Commonly known as:  DEMADEX  Take 4 tablets (80 mg total) by mouth 2 (two) times daily. Take 4 tablets in the morning and 2 tablets in the afternoon.     ZIOPTAN 0.0015 % Soln  Generic drug:  Tafluprost  Place 1 drop into both eyes at bedtime. Use at night        Disposition   The patient will be discharged in stable condition to home.  Follow-up Information   Follow up with [name], MD On 01/06/2014. (9 am. Please re-schedule this if you are going to still be in the hospital at this time. )    Specialty:  Cardiology   Contact information:   [address] [id number] [phone]         Duration of Discharge Encounter: Greater than 30 minutes including physician and PA time.  SignedVertell Limber, KATHRYN  PA-C 12/29/2013, 1:25 PM   I have examined the patient and reviewed assessment and plan and discussed with patient.  Agree with above as stated.  Appears euvolemic.  Hopefully will get stronger in rehab.  Has been stable on oral meds.  F/U with [name].    Ezariah Nace S.

## 2013-12-29 NOTE — H&P (Signed)
Physical Medicine and Rehabilitation Admission H&P  Chief Complaint   Patient presents with   .  Deconditioning   HPI: [name] is a 80 y.o. male with history of DM, CAD, severe pulmonary HTN, CKD, A fib, recent admission for increase in dyspnea and angina 08/7-08/10/15. He was readmitted on 08/11/125 with generalized weakness, fall and dehydration. He was treated with IVF but developed acute on chronic diastolic HF due to rehydration. Foley placed due to recurrent urinary retention. He responded to IV diuresis and end of life issues discussed with patient and family. Patient has chosen aggressive treatment. SOB improving on current regimen with BP controlled and chronic CKD stable. Foley discontinued and patient voiding without difficulty. Patient deconditioned and CIR recommended by MD and therapy team.    ROS  HENT: Negative for hearing loss.  Eyes: Negative for blurred vision and double vision.  Respiratory: Negative for cough and shortness of breath.  Cardiovascular: Negative for chest pain, palpitations and leg swelling.  Gastrointestinal: Negative for heartburn and abdominal pain.  Genitourinary: Negative for urgency and frequency.  Musculoskeletal: Positive for myalgias.  Neurological: Negative for dizziness and headaches.  Psychiatric/Behavioral: Negative for depression. The patient does not have insomnia.  Past Medical History   Diagnosis  Date   .  Diabetes mellitus    .  Hyperlipidemia    .  Coronary artery disease    .  Spinal stenosis    .  Pneumonia    .  Pacemaker    .  Anemia    .  Carotid artery disease    .  PVD (peripheral vascular disease)    .  Hyponatremia      Chronic   .  CVA (cerebral infarction)    .  CKD (chronic kidney disease) stage 4, GFR 15-29 ml/min    .  Long term (current) use of anticoagulants      No bleeding on Eliquis   .  Dysrhythmia    .  PSVT (paroxysmal supraventricular tachycardia)    .  PAF (paroxysmal atrial fibrillation)     Asymptomatic. On Eliquis.   .  AV block, 3rd degree      With DDD St. Jude PM, initially placed in 1993 by Dr. Nils Pyle. Device upgrade 10/2002 to DDD, by Dr. Rollene Fare complicated by bleeding.   .  Essential hypertension, benign    .  CHF (congestive heart failure)    .  Chronic diastolic congestive heart failure      ECHO 05/20/12 LVEF estimated by 2D at 55-60%   .  Arthritis    .  Shortness of breath     Past Surgical History   Procedure  Laterality  Date   .  Lumbar laminectomy   1995   .  Back surgery     .  Total knee arthroplasty  Left    .  Quadriceps tendon repair     .  Cataract extraction     .  Carotid endarterectomy     .  Yag laser application  Right  9/92/4268     Procedure: YAG LASER CAPSULOTOMY OF RIGHT EYE; Surgeon: [name], MD; Location: [facility]; Service: Ophthalmology; Laterality: Right;   .  Tonsillectomy     .  Insert / replace / remove pacemaker       DDD St. Jude PM, initially placed in 1993 by Dr. Nils Pyle. Device upgrade 10/2002 to DDD, by Dr. Rollene Fare complicated by bleeding.   [name]  Carotid endarterectomy  Right  2006   .  Transurethral resection of prostate     .  Cardioversion  N/A  06/16/2013     Procedure: CARDIOVERSION BEDSIDE; Surgeon: [name], MD; Location: [facility] OR; Service: Cardiovascular; Laterality: N/A;    Family History   Problem  Relation  Age of Onset   .  Multiple myeloma  Father    .  Hypertension  Mother     Social History: Lives alone. Still works four days a week. 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 Plans to hire someone? Per reports that he quit smoking about 55 years ago. His smoking use included Pipe. He has never used smokeless tobacco. He reports that he does not drink alcohol or use illicit drugs  Allergies   Allergen  Reactions   .  Aggrenox [Aspirin-Dipyridamole Er]  Other (See Comments)     Dizziness   .  Carvedilol  Other (See Comments)     Dizziness   .  Claritin [Loratadine]  Other (See  Comments)     Dizziness & drowsiness   .  Mucinex [Guaifenesin Er]  Other (See Comments)     Dizziness, drowsiness   .  Plavix [Clopidogrel Bisulfate]  Other (See Comments)     Dizziness   .  Rapaflo [Silodosin]  Other (See Comments)     Dizziness for about 30- 45 minutes   .  Statins  Other (See Comments)     rhabdomyolisis   .  Travatan Z [Travoprost]  Other (See Comments)     Eye irritation    Medications Prior to Admission   Medication  Sig  Dispense  Refill   .  allopurinol (ZYLOPRIM) 100 MG tablet  Take 200 mg by mouth daily.     [name]  amiodarone (PACERONE) 200 MG tablet  Take 200 mg by mouth daily.     [name]  apixaban (ELIQUIS) 2.5 MG TABS tablet  Take 2.5 mg by mouth 2 (two) times daily.     [name]  dutasteride (AVODART) 0.5 MG capsule  Take 0.5 mg by mouth daily.     .  ferrous sulfate 325 (65 FE) MG tablet  Take 325 mg by mouth every other day.     .  fexofenadine (ALLEGRA) 180 MG tablet  Take 180 mg by mouth daily.     .  Flaxseed, Linseed, (BL FLAX SEED OIL PO)  Take 1 capsule by mouth 2 (two) times daily.     .  hydrALAZINE (APRESOLINE) 25 MG tablet  Take 25 mg by mouth every 12 (twelve) hours.     .  insulin aspart (NOVOLOG FLEXPEN) 100 UNIT/ML FlexPen  Inject 2-9 Units into the skin 3 (three) times daily with meals. Sliding scale     .  insulin glargine (LANTUS) 100 UNIT/ML injection  Inject 12 Units into the skin at bedtime.     .  isosorbide mononitrate (IMDUR) 60 MG 24 hr tablet  Take 1 tablet (60 mg total) by mouth daily.  30 tablet  5   .  metoprolol tartrate (LOPRESSOR) 25 MG tablet  Take 1 tablet (25 mg total) by mouth daily.  30 tablet  6   .  Multiple Vitamin (MULTIVITAMIN WITH MINERALS) TABS  Take 1 tablet by mouth daily. Diabetic pak by Petra Kuba from [employer]     .  nitroGLYCERIN (NITROSTAT) 0.4 MG SL tablet  Place 1 tablet (0.4 mg total) under the tongue every 5 (five) minutes  as needed for chest pain.  25 tablet  3   .  Polyethyl Glycol-Propyl Glycol (SYSTANE) 0.4-0.3 % SOLN   Place 1 drop into both eyes daily as needed (dry eyes). Into affected eyes     .  polyethylene glycol (MIRALAX / GLYCOLAX) packet  Take 17 g by mouth 2 (two) times daily as needed for mild constipation. For constipation     .  Polyvinyl Alcohol-Povidone (REFRESH OP)  Place 1 drop into both eyes 2 (two) times daily. Refresh Gel Opth     .  potassium chloride SA (K-DUR,KLOR-CON) 20 MEQ tablet  Take 1 tablet (20 mEq total) by mouth daily.  30 tablet  6   .  ranolazine (RANEXA) 500 MG 12 hr tablet  Take 1 tablet (500 mg total) by mouth 2 (two) times daily.  60 tablet  5   .  silodosin (RAPAFLO) 8 MG CAPS capsule  Take 8 mg by mouth daily with breakfast.     .  Tafluprost (ZIOPTAN) 0.0015 % SOLN  Place 1 drop into both eyes at bedtime. Use at night     .  torsemide (DEMADEX) 20 MG tablet  Take 4 tablets (80 mg total) by mouth 2 (two) times daily. Take 4 tablets in the morning and 2 tablets in the afternoon.  240 tablet  5    Home:  Home Living  Family/patient expects to be discharged to:: Private residence  Living Arrangements: Other relatives;Children  Available Help at Discharge: Family;Friend(s);Available 24 hours/day  Type of Home: House  Home Access: Stairs to enter  [employer] of Steps: 2  Entrance Stairs-Rails: Right;Left;Can reach both  Home Layout: One level (2 steps to get from one part of the house to another)  Home Equipment: Walker - 4 wheels;Shower seat  Functional History:  Prior Function  Level of Independence: Needs assistance  Gait / Transfers Assistance Needed: Idependent with the rollator  ADL's / Homemaking Assistance Needed: Stepping in and out of shower needs assist, washing back needs assist  Functional Status:  Mobility:  Bed Mobility  Overal bed mobility: (Pt in recliner when PT arrived. )  Bed Mobility: Supine to Sit  Supine to sit: Min guard  General bed mobility comments: use of rail and increased time  Transfers  Overall transfer level: Needs  assistance  Equipment used: Rolling walker (2 wheeled)  Transfers: Sit to/from Stand  Sit to Stand: Min assist  General transfer comment: Requires assist for increased forward weight shift/trunk lean with mod cues for hand placement and safety when sitting/standing.  Ambulation/Gait  Ambulation/Gait assistance: Min assist;Mod assist  Ambulation Distance (Feet): 70 Feet  Assistive device: Rolling walker (2 wheeled)  Gait Pattern/deviations: Step-through pattern;Decreased stride length;Trunk flexed  Gait velocity: decreased  Gait velocity interpretation: Below normal speed for age/gender  General Gait Details: Pt continues to have decreased gait speed and generalized weakness, requiring min/mod assist for gait for safety. Note that knees did not buckle during gait with use of RW, however did note quad weakness in ability to extend knees during stance phase of gait.  Stairs: Yes  Stairs assistance: Mod assist  Stair Management: One rail Right;Step to pattern;Backwards  Number of Stairs: 6  General stair comments: Pt has 2 STE home with B handrails but also another 2 steps inside home with single handrail. Requires mod A for stair negotiation with single handrail and PT provided HHA on LUE for stability.   ADL:   Cognition:  Cognition  Overall Cognitive Status:  Within Functional Limits for tasks assessed  Cognition  Arousal/Alertness: Awake/alert  Behavior During Therapy: WFL for tasks assessed/performed  Overall Cognitive Status: Within Functional Limits for tasks assessed  Physical Exam:  Blood pressure 132/59, pulse 76, temperature 98.3 F (36.8 C), temperature source Oral, resp. rate 18, height _0  (1.702 m), weight 84.687 kg (186 lb 11.2 oz), SpO2 96.00%.  Physical Exam  Nursing note and vitals reviewed.  Constitutional: He is oriented to person, place, and time. He appears well-developed and well-nourished.  Sitting at EOB without difficulty.  HENT:  Head: Normocephalic and  atraumatic.  Right Ear: External ear normal.  Left Ear: External ear normal.  Eyes: Conjunctivae are normal. Pupils are equal, round, and reactive to light.  Neck: Normal range of motion. Neck supple. No JVD present. No tracheal deviation present. No thyromegaly present.  Cardiovascular: Normal rate and regular rhythm.  No murmur heard.  Respiratory: Breath sounds normal. No respiratory distress. He has no wheezes.  GI: Soft. Bowel sounds are normal. He exhibits no distension. There is no tenderness.  Musculoskeletal: He exhibits no edema.  Some tightness bilateral knees. Well healed old L-TKR incision. Left foot drop.  Neurological: He is alert and oriented to person, place, and time. He displays normal reflexes. No cranial nerve deficit.  UES: 5/5 biceps, triceps, wrist, HI. LES: 3+ HF, 4- KE, 4+ Right ADF/APF. Left ADF: 1/5, left APF 2+/5. Mild sensory loss over lateral foot and lower leg  Skin: Skin is warm and dry.  Psychiatric: He has a normal mood and affect. His behavior is normal. Judgment and thought content normal.    Results for orders placed during the hospital encounter of 12/22/13 (from the past 48 hour(s))   GLUCOSE, CAPILLARY Status: Abnormal    Collection Time    12/27/13 8:52 PM   Result  Value  Ref Range    Glucose-Capillary  176 (*)  70 - 99 mg/dL    Comment 1  Documented in Chart     Comment 2  Notify RN    BASIC METABOLIC PANEL Status: Abnormal    Collection Time    12/28/13 4:05 AM   Result  Value  Ref Range    Sodium  137  137 - 147 mEq/L    Potassium  3.8  3.7 - 5.3 mEq/L    Chloride  95 (*)  96 - 112 mEq/L    CO2  32  19 - 32 mEq/L    Glucose, Bld  83  70 - 99 mg/dL    BUN  38 (*)  6 - 23 mg/dL    Creatinine, Ser  1.97 (*)  0.50 - 1.35 mg/dL    Calcium  9.4  8.4 - 10.5 mg/dL    GFR calc non Af Amer  28 (*)  >90 mL/min    GFR calc Af Amer  32 (*)  >90 mL/min    Comment:  (NOTE)     The eGFR has been calculated using the CKD EPI equation.     This  calculation has not been validated in all clinical situations.     eGFR's persistently <90 mL/min signify possible Chronic Kidney     Disease.    Anion gap  10  5 - 15   GLUCOSE, CAPILLARY Status: None    Collection Time    12/28/13 6:29 AM   Result  Value  Ref Range    Glucose-Capillary  84  70 - 99 mg/dL    Comment 1  Documented in Chart     Comment 2  Notify RN    GLUCOSE, CAPILLARY Status: Abnormal    Collection Time    12/28/13 11:18 AM   Result  Value  Ref Range    Glucose-Capillary  231 (*)  70 - 99 mg/dL   GLUCOSE, CAPILLARY Status: Abnormal    Collection Time    12/28/13 3:50 PM   Result  Value  Ref Range    Glucose-Capillary  248 (*)  70 - 99 mg/dL    Comment 1  Notify RN    GLUCOSE, CAPILLARY Status: Abnormal    Collection Time    12/28/13 9:25 PM   Result  Value  Ref Range    Glucose-Capillary  169 (*)  70 - 99 mg/dL    Comment 1  Notify RN     Comment 2  Documented in Chart    GLUCOSE, CAPILLARY Status: Abnormal    Collection Time    12/29/13 5:37 AM   Result  Value  Ref Range    Glucose-Capillary  195 (*)  70 - 99 mg/dL   GLUCOSE, CAPILLARY Status: Abnormal    Collection Time    12/29/13 10:53 AM   Result  Value  Ref Range    Glucose-Capillary  243 (*)  70 - 99 mg/dL    Comment 1  Notify RN    GLUCOSE, CAPILLARY Status: Abnormal    Collection Time    12/29/13 2:59 PM   Result  Value  Ref Range    Glucose-Capillary  314 (*)  70 - 99 mg/dL    Comment 1  Notify RN     No results found.  Medical Problem List and Plan:  1. Functional deficits secondary to Deconditioning due to multiple medical issues. (Hx of left foot drop)  2. DVT Prophylaxis/Anticoagulation: Pharmaceutical: Other (comment)--Eliquis  3. Pain Management: N/A  4. Mood: LCSW to follow for evaluation and support. Has good outlook and wants to get back to work.  5. Neuropsych: This patient is capable of making decisions on his own behalf.  6. Skin/Wound Care: Encourage out of be activity.  Maintain adequate nutritional and hydration status.  7. CHF: Compensated at current time with improvement of respiratory status. Continue Imdur, metoprolol, hydralazine and Demadex.  8. DM type 2: Will monitor BS with ac/hs checks. Po intake is good. Continue lantus 12 units at bedtime with SSI for elevated BS.  9. A fib: Will monitor heart rate tid. Continue amiodarone and metoprolol for rate control.  10. Urinary retention: resolved. Continue Avodart and Rapflo. Check UA/UCS.    Post Admission Physician Evaluation:  1. Functional deficits secondary to deconditioning after multiple medical issues. 2. Patient is admitted to receive collaborative, interdisciplinary care between the physiatrist, rehab nursing staff, and therapy team. 3. Patient's level of medical complexity and substantial therapy needs in context of that medical necessity cannot be provided at a lesser intensity of care such as a SNF. 4. Patient has experienced substantial functional loss from his/her baseline which was documented above under the "Functional History" and "Functional Status" headings. Judging by the patient's diagnosis, physical exam, and functional history, the patient has potential for functional progress which will result in measurable gains while on inpatient rehab. These gains will be of substantial and practical use upon discharge in facilitating mobility and self-care at the household level. 5. Physiatrist will provide 24 hour management of medical needs as well as oversight of the therapy plan/treatment and provide guidance as appropriate  regarding the interaction of the two. 6. 24 hour rehab nursing will assist with bladder management, bowel management, safety, skin/wound care, disease management, medication administration, pain management and patient education and help integrate therapy concepts, techniques,education, etc. 7. PT will assess and treat for/with: Lower extremity strength, range of motion, stamina,  balance, functional mobility, safety, adaptive techniques and equipment, leisure awareness, exercise tolerance, orthotic assessment. Goals are: mod I to supervision. 8. OT will assess and treat for/with: ADL's, functional mobility, safety, upper extremity strength, adaptive techniques and equipment, community reintegration, caregiver ed. Goals are: supervision to mod I. 9. SLP will assess and treat for/with: n/a. Goals are: n/a. 10. Case Management and Social Worker will assess and treat for psychological issues and discharge planning. 11. Team conference will be held weekly to assess progress toward goals and to determine barriers to discharge. 12. Patient will receive at least 3 hours of therapy per day at least 5 days per week. 13. ELOS: 7-10 days  14. Prognosis: excellent  Meredith Staggers, MD, [facility]   12/29/2013

## 2013-12-29 NOTE — Consult Note (Signed)
Physical Medicine and Rehabilitation Consult Reason for Consult: Deconditioning Referring Physician: Dr. Debara Pickett   HPI:  [name] is a 80 y.o. male with history of DM, CAD, severe pulmonary HTN, CKD,  A fib, recent admission for increase in dyspnea and angina 08/7-08/10/15. He was readmitted on  08/11/125 with generalized weakness, fall and dehydration. He was treated with IVF but developed acute on chronic diastolic HF due to rehydration. Foley placed due to recurrent urinary retention.  He responded to IV diuresis and end of life issues discussed with patient and family. Patient has chosen aggressive treatment. SOB improving on current regimen with BP controlled and chronic CKD stable. Patient deconditioned and CIR recommended by MD and therapy team.    Review of Systems  HENT: Negative for hearing loss.   Eyes: Negative for blurred vision and double vision.  Respiratory: Negative for cough and shortness of breath.   Cardiovascular: Negative for chest pain, palpitations and leg swelling.  Gastrointestinal: Negative for heartburn and abdominal pain.  Genitourinary: Negative for urgency and frequency.  Musculoskeletal: Positive for myalgias.  Neurological: Negative for dizziness and headaches.  Psychiatric/Behavioral: Negative for depression. The patient does not have insomnia.      Past Medical History  Diagnosis Date  . Diabetes mellitus   . Hyperlipidemia   . Coronary artery disease   . Spinal stenosis   . Pneumonia   . Pacemaker   . Anemia   . Carotid artery disease   . PVD (peripheral vascular disease)   . Hyponatremia     Chronic  . CVA (cerebral infarction)   . CKD (chronic kidney disease) stage 4, GFR 15-29 ml/min   . Long term (current) use of anticoagulants     No bleeding on Eliquis  . Dysrhythmia   . PSVT (paroxysmal supraventricular tachycardia)   . PAF (paroxysmal atrial fibrillation)     Asymptomatic. On Eliquis.  . AV block, 3rd degree     With  DDD St. Jude PM, initially placed in 1993 by Dr. Nils Pyle. Device upgrade 10/2002 to DDD, by Dr. Rollene Fare complicated by bleeding.  . Essential hypertension, benign   . CHF (congestive heart failure)   . Chronic diastolic congestive heart failure     ECHO 05/20/12 LVEF estimated by 2D at 55-60%  . Arthritis   . Shortness of breath     Past Surgical History  Procedure Laterality Date  . Lumbar laminectomy  1995  . Back surgery    . Total knee arthroplasty Left   . Quadriceps tendon repair    . Cataract extraction    . Carotid endarterectomy    . Yag laser application Right 2/87/6811    Procedure: YAG LASER CAPSULOTOMY OF RIGHT EYE;  Surgeon: [name], MD;  Location: [place];  Service: Ophthalmology;  Laterality: Right;  . Tonsillectomy    . Insert / replace / remove pacemaker      DDD St. Jude PM, initially placed in 1993 by Dr. Nils Pyle. Device upgrade 10/2002 to DDD, by Dr. Rollene Fare complicated by bleeding.  . Carotid endarterectomy Right 2006  . Transurethral resection of prostate    . Cardioversion N/A 06/16/2013    Procedure: CARDIOVERSION BEDSIDE;  Surgeon: [name], MD;  Location: [facility] OR;  Service: Cardiovascular;  Laterality: N/A;    Family History  Problem Relation Age of Onset  . Multiple myeloma Father   . Hypertension Mother     Social History: Lives alone. Still works  four days a week. 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 Plans to hire someone? Per  reports that he quit smoking about 55 years ago. His smoking use included Pipe. He has never used smokeless tobacco. He reports that he does not drink alcohol or use illicit drugs.   Allergies  Allergen Reactions  . Aggrenox [Aspirin-Dipyridamole Er] Other (See Comments)    Dizziness  . Carvedilol Other (See Comments)    Dizziness  . Claritin [Loratadine] Other (See Comments)    Dizziness & drowsiness   . Mucinex [Guaifenesin Er] Other (See Comments)    Dizziness, drowsiness  .  Plavix [Clopidogrel Bisulfate] Other (See Comments)    Dizziness  . Rapaflo [Silodosin] Other (See Comments)    Dizziness for about 30- 45  minutes  . Statins Other (See Comments)    rhabdomyolisis  . Travatan Z [Travoprost] Other (See Comments)    Eye irritation    Medications Prior to Admission  Medication Sig Dispense Refill  . allopurinol (ZYLOPRIM) 100 MG tablet Take 200 mg by mouth daily.       [name] amiodarone (PACERONE) 200 MG tablet Take 200 mg by mouth daily.      [name] apixaban (ELIQUIS) 2.5 MG TABS tablet Take 2.5 mg by mouth 2 (two) times daily.      [name] dutasteride (AVODART) 0.5 MG capsule Take 0.5 mg by mouth daily.       . ferrous sulfate 325 (65 FE) MG tablet Take 325 mg by mouth every other day.       . fexofenadine (ALLEGRA) 180 MG tablet Take 180 mg by mouth daily.      . Flaxseed, Linseed, (BL FLAX SEED OIL PO) Take 1 capsule by mouth 2 (two) times daily.      . hydrALAZINE (APRESOLINE) 25 MG tablet Take 25 mg by mouth every 12 (twelve) hours.       . insulin aspart (NOVOLOG FLEXPEN) 100 UNIT/ML FlexPen Inject 2-9 Units into the skin 3 (three) times daily with meals. Sliding scale      . insulin glargine (LANTUS) 100 UNIT/ML injection Inject 12 Units into the skin at bedtime.      . isosorbide mononitrate (IMDUR) 60 MG 24 hr tablet Take 1 tablet (60 mg total) by mouth daily.  30 tablet  5  . metoprolol tartrate (LOPRESSOR) 25 MG tablet Take 1 tablet (25 mg total) by mouth daily.  30 tablet  6  . Multiple Vitamin (MULTIVITAMIN WITH MINERALS) TABS Take 1 tablet by mouth daily. Diabetic pak by Petra Kuba from [employer]      . nitroGLYCERIN (NITROSTAT) 0.4 MG SL tablet Place 1 tablet (0.4 mg total) under the tongue every 5 (five) minutes as needed for chest pain.  25 tablet  3  . Polyethyl Glycol-Propyl Glycol (SYSTANE) 0.4-0.3 % SOLN Place 1 drop into both eyes daily as needed (dry eyes). Into affected eyes      . polyethylene glycol (MIRALAX / GLYCOLAX) packet Take 17 g by mouth 2 (two)  times daily as needed for mild constipation. For constipation      . Polyvinyl Alcohol-Povidone (REFRESH OP) Place 1 drop into both eyes 2 (two) times daily. Refresh Gel Opth      . potassium chloride SA (K-DUR,KLOR-CON) 20 MEQ tablet Take 1 tablet (20 mEq total) by mouth daily.  30 tablet  6  . ranolazine (RANEXA) 500 MG 12 hr tablet Take 1 tablet (500 mg total) by mouth 2 (two) times daily.  [place]  tablet  5  . silodosin (RAPAFLO) 8 MG CAPS capsule Take 8 mg by mouth daily with breakfast.      . Tafluprost (ZIOPTAN) 0.0015 % SOLN Place 1 drop into both eyes at bedtime. Use at night      . torsemide (DEMADEX) 20 MG tablet Take 4 tablets (80 mg total) by mouth 2 (two) times daily. Take 4 tablets in the morning and 2 tablets in the afternoon.  240 tablet  5    Home: Home Living Family/patient expects to be discharged to:: Private residence Living Arrangements: Other relatives;Children Available Help at Discharge: Family;Friend(s);Available 24 hours/day Type of Home: House Home Access: Stairs to enter [employer] of Steps: 2 Entrance Stairs-Rails: Right;Left;Can reach both Home Layout: One level (2 steps to get from one part of the house to another) Home Equipment: Walker - 4 wheels;Shower seat  Functional History: Prior Function Level of Independence: Needs assistance Gait / Transfers Assistance Needed: Idependent with the rollator ADL's / Homemaking Assistance Needed: Stepping in and out of shower needs assist, washing back needs assist Functional Status:  Mobility: Bed Mobility Overal bed mobility:  (Pt in recliner when PT arrived. ) Bed Mobility: Supine to Sit Supine to sit: Min guard General bed mobility comments: use of rail and increased time Transfers Overall transfer level: Needs assistance Equipment used: Rolling walker (2 wheeled) Transfers: Sit to/from Stand Sit to Stand: Min assist General transfer comment: Requires assist for increased forward weight  shift/trunk lean with mod cues for hand placement and safety when sitting/standing.  Ambulation/Gait Ambulation/Gait assistance: Min assist;Mod assist Ambulation Distance (Feet): 70 Feet Assistive device: Rolling walker (2 wheeled) Gait Pattern/deviations: Step-through pattern;Decreased stride length;Trunk flexed Gait velocity: decreased Gait velocity interpretation: Below normal speed for age/gender General Gait Details: Pt continues to have decreased gait speed and generalized weakness, requiring min/mod assist for gait for safety.  Note that knees did not buckle during gait with use of RW, however did note quad weakness in ability to extend knees during stance phase of gait.  Stairs: Yes Stairs assistance: Mod assist Stair Management: One rail Right;Step to pattern;Backwards Number of Stairs: 6 General stair comments: Pt has 2 STE home with B handrails but also another 2 steps inside home with single handrail.  Requires mod A for stair negotiation with single handrail and PT provided HHA on LUE for stability.       ADL:    Cognition: Cognition Overall Cognitive Status: Within Functional Limits for tasks assessed Cognition Arousal/Alertness: Awake/alert Behavior During Therapy: WFL for tasks assessed/performed Overall Cognitive Status: Within Functional Limits for tasks assessed  Blood pressure 133/57, pulse 64, temperature 97.8 F (36.6 C), temperature source Oral, resp. rate 18, height '5\' 7"'  (1.702 m), weight 84.687 kg (186 lb 11.2 oz), SpO2 98.00%. Physical Exam  Nursing note and vitals reviewed. Constitutional: He is oriented to person, place, and time. He appears well-developed and well-nourished.  Sitting at EOB without difficulty.   HENT:  Head: Normocephalic and atraumatic.  Right Ear: External ear normal.  Left Ear: External ear normal.  Eyes: Conjunctivae are normal. Pupils are equal, round, and reactive to light.  Neck: Normal range of motion. Neck supple. No JVD  present. No tracheal deviation present. No thyromegaly present.  Cardiovascular: Normal rate and regular rhythm.   No murmur heard. Respiratory: Breath sounds normal. No respiratory distress. He has no wheezes.  GI: Soft. Bowel sounds are normal. He exhibits no distension. There is no tenderness.  Musculoskeletal: He exhibits no edema.  Some tightness bilateral knees. Well healed old L-TKR incision. Left foot drop.   Neurological: He is alert and oriented to person, place, and time. He displays normal reflexes. No cranial nerve deficit.  UES: 5/5 biceps, triceps, wrist, HI. LES: 3+ HF, 4- KE, 4+ Right ADF/APF. Left ADF: 1/5, left APF 2+/5. Mild sensory loss over lateral foot and lower leg  Skin: Skin is warm and dry.  Psychiatric: He has a normal mood and affect. His behavior is normal. Judgment and thought content normal.    Results for orders placed during the hospital encounter of 12/22/13 (from the past 24 hour(s))  GLUCOSE, CAPILLARY     Status: Abnormal   Collection Time    12/28/13 11:18 AM      Result Value Ref Range   Glucose-Capillary 231 (*) 70 - 99 mg/dL  GLUCOSE, CAPILLARY     Status: Abnormal   Collection Time    12/28/13  3:50 PM      Result Value Ref Range   Glucose-Capillary 248 (*) 70 - 99 mg/dL   Comment 1 Notify RN    GLUCOSE, CAPILLARY     Status: Abnormal   Collection Time    12/28/13  9:25 PM      Result Value Ref Range   Glucose-Capillary 169 (*) 70 - 99 mg/dL   Comment 1 Notify RN     Comment 2 Documented in Chart    GLUCOSE, CAPILLARY     Status: Abnormal   Collection Time    12/29/13  5:37 AM      Result Value Ref Range   Glucose-Capillary 195 (*) 70 - 99 mg/dL   No results found.  Assessment/Plan: Diagnosis: deconditioning after multiple medical issues 1. Does the need for close, 24 hr/day medical supervision in concert with the patient's rehab needs make it unreasonable for this patient to be served in a less intensive setting?  Yes 2. Co-Morbidities requiring supervision/potential complications: htn, PAF, CKD, CHF 3. Due to bladder management, bowel management, safety, skin/wound care, disease management, medication administration, pain management and patient education, does the patient require 24 hr/day rehab nursing? Yes 4. Does the patient require coordinated care of a physician, rehab nurse, PT (1-2 hrs/day, 5 days/week) and OT (1-2 hrs/day, 5 days/week) to address physical and functional deficits in the context of the above medical diagnosis(es)? Yes Addressing deficits in the following areas: balance, endurance, locomotion, strength, transferring, bowel/bladder control, bathing, dressing, feeding, grooming and toileting 5. Can the patient actively participate in an intensive therapy program of at least 3 hrs of therapy per day at least 5 days per week? Yes 6. The potential for patient to make measurable gains while on inpatient rehab is excellent 7. Anticipated functional outcomes upon discharge from inpatient rehab are modified independent and supervision  with PT, modified independent and supervision with OT, n/a with SLP. 8. Estimated rehab length of stay to reach the above functional goals is: 8-12 days 9. Does the patient have adequate social supports to accommodate these discharge functional goals? Yes 10. Anticipated D/C setting: Home 11. Anticipated post D/C treatments: HH therapy and Outpatient therapy 12. Overall Rehab/Functional Prognosis: excellent  RECOMMENDATIONS: This patient's condition is appropriate for continued rehabilitative care in the following setting: CIR Patient has agreed to participate in recommended program. Yes Note that insurance prior authorization may be required for reimbursement for recommended care.  Comment: This surprisingly spry 80 yo male would do quite well in our program. Rehab Admissions Coordinator to follow up.  Thanks,  Meredith Staggers, MD,  Mellody Drown     12/29/2013

## 2013-12-29 NOTE — Progress Notes (Signed)
SUBJECTIVE:  Feels well. No SHOB.  Rehab consult recommended by PT yesterday.  Awaiting word whether he can go to rehab.  He was upset about multiple attempts at lab draws.  OBJECTIVE:   Vitals:   Filed Vitals:   12/28/13 1500 12/28/13 2123 12/29/13 0632 12/29/13 0641  BP: 114/70 140/68  133/57  Pulse: 66 61  64  Temp: 97.7 F (36.5 C) 97.9 F (36.6 C)  97.8 F (36.6 C)  TempSrc: Oral Axillary  Oral  Resp: 18 18  18   Height:      Weight:   186 lb 11.2 oz (84.687 kg)   SpO2: 96% 100%  98%   I&O's:    Intake/Output Summary (Last 24 hours) at 12/29/13 0849 Last data filed at 12/28/13 2205  Gross per 24 hour  Intake   1320 ml  Output   1451 ml  Net   -131 ml        PHYSICAL EXAM General: Well developed, well nourished, in no acute distress Head:   Normal cephalic and atramatic  Lungs:   Scant crackles at the right base. Heart:   HRRR S1 S2  No JVD.   Abdomen: abdomen soft and non-tender Msk:  Back normal,  Normal strength and tone for age. Extremities:   No edema.   Neuro: Alert and oriented. Psych:  Normal affect, responds appropriately   LABS: Basic Metabolic Panel:  Recent Labs  12/27/13 0536 12/28/13 0405  NA 137 137  K 4.1 3.8  CL 95* 95*  CO2 29 32  GLUCOSE 87 83  BUN 37* 38*  CREATININE 1.97* 1.97*  CALCIUM 9.2 9.4   Liver Function Tests: No results found for this basename: AST, ALT, ALKPHOS, BILITOT, PROT, ALBUMIN,  in the last 72 hours No results found for this basename: LIPASE, AMYLASE,  in the last 72 hours CBC: No results found for this basename: WBC, NEUTROABS, HGB, HCT, MCV, PLT,  in the last 72 hours Cardiac Enzymes: No results found for this basename: CKTOTAL, CKMB, CKMBINDEX, TROPONINI,  in the last 72 hours BNP: No components found with this basename: POCBNP,  D-Dimer: No results found for this basename: DDIMER,  in the last 72 hours Hemoglobin A1C: No results found for this basename: HGBA1C,  in the last 72 hours Fasting  Lipid Panel: No results found for this basename: CHOL, HDL, LDLCALC, TRIG, CHOLHDL, LDLDIRECT,  in the last 72 hours Thyroid Function Tests: No results found for this basename: TSH, T4TOTAL, FREET3, T3FREE, THYROIDAB,  in the last 72 hours Anemia Panel: No results found for this basename: VITAMINB12, FOLATE, FERRITIN, TIBC, IRON, RETICCTPCT,  in the last 72 hours Coag Panel:   Lab Results  Component Value Date   INR 1.01 08/21/2012   INR 0.98 08/18/2012   INR 1.00 02/29/2012    RADIOLOGY: Dg Chest 2 View  12/23/2013   CLINICAL DATA:  History of CHF. Coronary artery disease. Now with acute dyspnea.  EXAM: CHEST  2 VIEW  COMPARISON:  12/22/2013.  FINDINGS: Hazy airspace opacity projects throughout both lungs. There is also interstitial thickening. Heart is mildly enlarged. There are small bilateral effusions. Thickening of the oblique fissures is noted. Findings support congestive heart failure.  Stable left anterior chest wall sequential pacemaker.  IMPRESSION: Congestive heart failure. Mild worsening since the previous day's study.   Electronically Signed   By: Lajean Manes M.D.   On: 12/23/2013 18:50   Dg Chest 2 View  12/22/2013   CLINICAL DATA:  Weakness.  EXAM: CHEST  2 VIEW  COMPARISON:  CT chest 09/05/2013. Plain film of the chest 12/18/2013, 11/20/2013 and 09/05/2013.  FINDINGS: Pacing device remains in place. There are small bilateral pleural effusions. Heart size is enlarged and there is interstitial edema. No pneumothorax identified.  IMPRESSION: Cardiomegaly and mild interstitial edema with associated small pleural effusions.   Electronically Signed   By: Inge Rise M.D.   On: 12/22/2013 15:59   Dg Chest Port 1 View  12/18/2013   CLINICAL DATA:  Chest pain and shortness of breath.  EXAM: PORTABLE CHEST - 1 VIEW  COMPARISON:  Chest radiograph November 20, 2013  FINDINGS: The cardiac silhouette remains mildly enlarged, mediastinal silhouette is nonsuspicious. Central pulmonary vascular  congestion with bibasilar patchy airspace opacities. No pleural effusions. No pneumothorax. Dual lead left cardiac pacemaker in situ. No pneumothorax.  Soft tissue planes and included osseous structures unremarkable.  IMPRESSION: Stable cardiomegaly, increasing central pulmonary vascular congestion with bibasilar patchy airspace opacities which could reflect confluent edema, less likely pneumonia. Recommend followup chest radiograph after treatment to verify improvement.   Electronically Signed   By: Elon Alas   On: 12/18/2013 05:57      ASSESSMENT/PLAN:   80 year old male with acute on chronic diastolic/systolic heart failure with recurrent multiple admissions, paroxysmal atrial fibrillation, pacemaker, chronic kidney disease stage IV.  1. Acute on chronic systolic/diastolic heart failure-he is improving. Continuing with torsemide by mouth 80 mg twice a day. Potassium is 4.1. Creatinine 1.97, stable. Will stop blood draws at this time.  All meds oral and he appears stable. I reviewed previous notes from Dr. Danton Sewer. [name].   2. Deconditioning-physical therapy. Potential discharge to inpatient rehab. 3. Hypertension-currently well controlled, stable.  4. Pacemaker-functioning well.  5. Chronic kidney disease stage IV-stable creatinine.  6. Paroxysmal atrial fibrillation/chronic anticoagulation-continue with low-dose Eliquis.  Anticipate discharge today or tomorrow, depending on inpt rehab decision.   [name]., MD  12/29/2013  8:49 AM

## 2013-12-29 NOTE — Progress Notes (Signed)
Patient ID: [name], male   DOB: 06/11/1918, 80 y.o.   MRN: [id number] Patient admitted to [phone] via wheelchair, escorted by nursing staff and family.  Patient has been to rehab here before, but updated patient on rehab process anyway.  Agreed to and signed safety agreement.  VSS.  Appears to be in no immediate distress at this time.  Will continue to monitor.  Brita Romp, RN

## 2013-12-29 NOTE — PMR Pre-admission (Signed)
PMR Admission Coordinator Pre-Admission Assessment  Patient: [name] is an 80 y.o., male MRN: [id number] DOB: December 07, 1918 Height: '5\' 7"'  (170.2 cm) Weight: 84.687 kg (186 lb 11.2 oz) (bed; pt states too tired to stand this am)              Insurance Information HMO: No    PPO:       PCP:       IPA:       80/20:       OTHER:   PRIMARY: Medicare A/B      Policy#: [id number]      Subscriber: Lindwood Qua CM Name:        Phone#:       Fax#:   Pre-Cert#:        Employer: Retired but still working 4 days a week [employer].  Is a gastroenterologist physician. Benefits:  Phone #:       Name: Checked in [place]. Date: 09/12/86=A and 11/12/87=B     Deduct: $1260      Out of Pocket Max: none      Life Max: unlimited CIR: 100%      SNF: 100 days Outpatient: 80%     Co-Pay: 20% Home Health: 100%      Co-Pay: none DME: 80%     Co-Pay: 20% Providers: patient's choice  SECONDARY: AARP      Policy#: [id number]      Subscriber: Lindwood Qua CM Name:        Phone#:       Fax#:   Pre-Cert#:        Employer: Retired Benefits:  Phone #: [phone]     Name:   Eff. Date:       Deduct:        Out of Pocket Max:        Life Max:   CIR:        SNF:   Outpatient:       Co-Pay:   Home Health:        Co-Pay:   DME:       Co-Pay:     Emergency Contact Information Contact Information   Name Relation Home Work Mobile   [place]  [phone]   Margaretha Seeds   [phone]   [place] Other [phone]  [phone]     Current Medical History  Patient Admitting Diagnosis:  Deconditioned after multiple medical issues  History of Present Illness:  A 80 y.o. male with history of DM, CAD, severe pulmonary HTN, CKD, A fib, recent admission for increase in dyspnea and angina 08/7-08/10/15. He was readmitted on 08/11/125 with generalized weakness, fall and dehydration. He was treated with IVF but developed acute on chronic diastolic HF due to rehydration. Foley placed  due to recurrent urinary retention. He responded to IV diuresis and end of life issues discussed with patient and family. Patient has chosen aggressive treatment. SOB improving on current regimen with BP controlled and chronic CKD stable. Patient deconditioned and CIR recommended by MD and therapy team.    Past Medical History  Past Medical History  Diagnosis Date  . Diabetes mellitus   . Hyperlipidemia   . Coronary artery disease   . Spinal stenosis   . Pneumonia   . Pacemaker   . Anemia   . Carotid artery disease   . PVD (peripheral vascular disease)   . Hyponatremia     Chronic  . CVA (cerebral infarction)   . CKD (  chronic kidney disease) stage 4, GFR 15-29 ml/min   . Long term (current) use of anticoagulants     No bleeding on Eliquis  . Dysrhythmia   . PSVT (paroxysmal supraventricular tachycardia)   . PAF (paroxysmal atrial fibrillation)     Asymptomatic. On Eliquis.  . AV block, 3rd degree     With DDD St. Jude PM, initially placed in 1993 by Dr. Nils Pyle. Device upgrade 10/2002 to DDD, by Dr. Rollene Fare complicated by bleeding.  . Essential hypertension, benign   . CHF (congestive heart failure)   . Chronic diastolic congestive heart failure     ECHO 05/20/12 LVEF estimated by 2D at 55-60%  . Arthritis   . Shortness of breath     Family History  family history includes Hypertension in his mother; Multiple myeloma in his father.  Prior Rehab/Hospitalizations:  Had patella tendon repair 12 yrs ago.  Had a TKR 20 years ago.   Current Medications  Current facility-administered medications:allopurinol (ZYLOPRIM) tablet 200 mg, 200 mg, Oral, Daily, Pixie Casino, MD, 200 mg at 12/29/13 1014;  amiodarone (PACERONE) tablet 200 mg, 200 mg, Oral, Daily, Sueanne Margarita, MD, 200 mg at 12/29/13 1014;  antiseptic oral rinse (CPC / CETYLPYRIDINIUM CHLORIDE 0.05%) solution 7 mL, 7 mL, Mouth Rinse, BID, Pixie Casino, MD, 7 mL at 12/29/13 1000 apixaban (ELIQUIS) tablet 2.5 mg, 2.5  mg, Oral, BID, Pixie Casino, MD, 2.5 mg at 12/29/13 1014;  dutasteride (AVODART) capsule 0.5 mg, 0.5 mg, Oral, Daily, Tarri Fuller, PA-C, 0.5 mg at 12/29/13 1014;  ferrous sulfate tablet 325 mg, 325 mg, Oral, QODAY, Pixie Casino, MD, 325 mg at 12/28/13 1035;  hydrALAZINE (APRESOLINE) tablet 25 mg, 25 mg, Oral, Q12H, Sueanne Margarita, MD, 25 mg at 12/29/13 1014 insulin aspart (novoLOG) injection 0-15 Units, 0-15 Units, Subcutaneous, TID WC, Belva Crome III, MD, 5 Units at 12/29/13 1214;  insulin glargine (LANTUS) injection 12 Units, 12 Units, Subcutaneous, QHS, Pixie Casino, MD, 12 Units at 12/28/13 2127;  isosorbide mononitrate (IMDUR) 24 hr tablet 30 mg, 30 mg, Oral, Daily, Pixie Casino, MD, 30 mg at 12/29/13 1014 latanoprost (XALATAN) 0.005 % ophthalmic solution 1 drop, 1 drop, Both Eyes, QHS, Pixie Casino, MD, 1 drop at 12/27/13 2200;  loratadine (CLARITIN) tablet 10 mg, 10 mg, Oral, Daily, Pixie Casino, MD, 10 mg at 12/29/13 1014;  metoprolol tartrate (LOPRESSOR) tablet 25 mg, 25 mg, Oral, Daily, Pixie Casino, MD, 25 mg at 12/29/13 1014 multivitamin with minerals tablet 1 tablet, 1 tablet, Oral, Daily, Pixie Casino, MD, 1 tablet at 12/29/13 1014;  nitroGLYCERIN (NITROSTAT) SL tablet 0.4 mg, 0.4 mg, Sublingual, Q5 min PRN, Pixie Casino, MD, 0.4 mg at 12/23/13 1641;  polyethylene glycol (MIRALAX / GLYCOLAX) packet 17 g, 17 g, Oral, BID PRN, Pixie Casino, MD, 17 g at 12/28/13 2204 polyvinyl alcohol (LIQUIFILM TEARS) 1.4 % ophthalmic solution 1 drop, 1 drop, Both Eyes, PRN, Pixie Casino, MD;  potassium chloride SA (K-DUR,KLOR-CON) CR tablet 20 mEq, 20 mEq, Oral, Daily, Pixie Casino, MD, 20 mEq at 12/29/13 1020;  silodosin (RAPAFLO) capsule 8 mg, 8 mg, Oral, Daily, [name] Hammons, RPH, 8 mg at 12/28/13 1800 sodium chloride 0.9 % injection 3 mL, 3 mL, Intravenous, Q12H, Pixie Casino, MD, 3 mL at 12/29/13 1016;  torsemide (DEMADEX) tablet 80 mg, 80 mg,  Oral, BID, Sueanne Margarita, MD, 80 mg at 12/29/13 1014  Patients Current Diet: Carb Control  Precautions / Restrictions Precautions Precautions: Fall Spinal Brace: Lumbar corset;Applied in sitting position;Other (comment) Spinal Brace Comments: long standing for comfort grossly 25years Other Brace/Splint: AFO LLE (From old knee sx) Restrictions Weight Bearing Restrictions: No   Prior Activity Level Community (5-7x/wk): Went to work M[employer] each week up until last Friday..  Home Assistive Devices / [place] Devices/Equipment: CBG Meter Home Equipment: Walker - 4 wheels;Shower seat  Prior Functional Level Prior Function Level of Independence: Needs assistance Gait / Transfers Assistance Needed: Idependent with the rollator ADL's / Homemaking Assistance Needed: Stepping in and out of shower needs assist, washing back needs assist  Current Functional Level Cognition  Overall Cognitive Status: Within Functional Limits for tasks assessed    Extremity Assessment (includes Sensation/Coordination)  Upper Extremity Assessment: Defer to OT evaluation  Lower Extremity Assessment: LLE deficits/detail  LLE Deficits / Details: Foot drop and decreased sensation consistent with spinal stenosis and peroneal nerve damage.  Cervical / Trunk Assessment: Kyphotic     ADLs  Anticipate ADL needs    Mobility  Overal bed mobility:  (Pt in recliner when PT arrived. ) Bed Mobility: Supine to Sit Supine to sit: Min guard General bed mobility comments: use of rail and increased time    Transfers  Overall transfer level: Needs assistance Equipment used: Rolling walker (2 wheeled) Transfers: Sit to/from Stand Sit to Stand: Min assist General transfer comment: Requires assist for increased forward weight shift/trunk lean with mod cues for hand placement and safety when sitting/standing.     Ambulation / Gait / Stairs / Wheelchair Mobility  Ambulation/Gait Ambulation/Gait assistance:  Min assist;Mod assist Ambulation Distance (Feet): 70 Feet Assistive device: Rolling walker (2 wheeled) Gait Pattern/deviations: Step-through pattern;Decreased stride length;Trunk flexed Gait velocity: decreased Gait velocity interpretation: Below normal speed for age/gender General Gait Details: Pt continues to have decreased gait speed and generalized weakness, requiring min/mod assist for gait for safety.  Note that knees did not buckle during gait with use of RW, however did note quad weakness in ability to extend knees during stance phase of gait.  Stairs: Yes Stairs assistance: Mod assist Stair Management: One rail Right;Step to pattern;Backwards Number of Stairs: 6 General stair comments: Pt has 2 STE home with B handrails but also another 2 steps inside home with single handrail.  Requires mod A for stair negotiation with single handrail and PT provided HHA on LUE for stability.       Posture / Balance Overall balance assessment: Needs assistance  Sitting-balance support: Feet supported  Sitting balance-Leahy Scale: Fair  Postural control: Posterior lean  Standing balance support: During functional activity;Bilateral upper extremity supported  Standing balance-Leahy Scale: Poor  Standing balance comment: Requires min A to maintain balance with use of RW.    Special needs/care consideration BiPAP/CPAP No CPM No Continuous Drip IV No Dialysis No        Life Ve No Oxygen No Special Bed No Trach Size No Wound Vac (area) No       Skin No                              Bowel mgmt: Last BM 12/29/13 Bladder mgmt: Using urinal with no incontinence Diabetic mgmt Yes, on insulin 4 X a day    Previous Home Environment Living Arrangements: Other relatives;Children Available Help at Discharge: Family;Friend(s);Available 24 hours/day Type of Home: House Home Layout: One level (2 steps to get from one part of  the house to another) Home Access: Stairs to enter Entrance Stairs-Rails:  Right;Left;Can reach both Entrance Stairs-Number of Steps: 2 Home Care Services: Yes Type of Home Care Services: Sitter  Discharge Living Setting Plans for Discharge Living Setting: Patient's home;House;Lives with (comment) (Lives with son.) Type of Home at Discharge: House Discharge Home Layout: Two level;Able to live on main level with bedroom/bathroom Alternate Level Stairs-Number of Steps: Flight Discharge Home Access: Stairs to enter Entrance Stairs-Number of Steps: 3 Does the patient have any problems obtaining your medications?: No  Social/Family/Support Systems Patient Roles: Parent (Widower.  Has 5 daughters and 2 sons.) Contact Information: Lindwood Qua III - son Anticipated Caregiver: Tr[place] and caregiver friend Anticipated Caregiver's Contact Information: Tabitha - son (h) [phone] (c) [phone] Ability/Limitations of Caregiver: Son currently not working, living with patient and can assist.  Has a male caregiver who can assist as needed. Caregiver Availability: 24/7 Discharge Plan Discussed with Primary Caregiver: Yes Is Caregiver In Agreement with Plan?: Yes Does Caregiver/Family have Issues with Lodging/Transportation while Pt is in Rehab?: No  Goals/Additional Needs Patient/Family Goal for Rehab: PT/OT mod I to supervision goals Expected length of stay: 8-12 days Cultural Considerations: Attends [employer]. Dietary Needs: Heart healthy, thin liquids Equipment Needs: TBD Pt/Family Agrees to Admission and willing to participate: Yes Program Orientation Provided & Reviewed with Pt/Caregiver Including Roles  & Responsibilities: Yes  Decrease burden of Care through IP rehab admission: N/A  Possible need for SNF placement upon discharge: Not anticipated  Patient Condition: This patient's condition remains as documented in the consult dated 12/29/13, in which the Rehabilitation Physician determined and documented that the patient's condition is appropriate  for intensive rehabilitative care in an inpatient rehabilitation facility. Will admit to inpatient rehab today.  Preadmission Screen Completed By:  Retta Diones, 12/29/2013 3:00 PM ______________________________________________________________________   Discussed status with Dr. Naaman Plummer on 12/29/13 at 1458 and received telephone approval for admission today.  Admission Coordinator:  Retta Diones, time1458/Date08/18/15

## 2013-12-29 NOTE — Progress Notes (Signed)
[facility] discharged to [facility]. Called report to nurse. Tech transferred patient to new unit.

## 2013-12-29 NOTE — Progress Notes (Signed)
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  Benefits: Phone #: Name: Checked in [place]. Date: 09/12/86=A and 11/12/87=B Deduct: $1260 Out of Pocket Max: none Life Max: unlimited  CIR: 100% SNF: 100 days  Outpatient: 80% Co-Pay: 20%  Home Health: 100% Co-Pay: none  DME: 80% Co-Pay: 20%  Providers: patient's choice   SECONDARY: AARP Policy#: [id number] Subscriber: Lindwood Qua  CM Name: Phone#: Fax#:  Pre-Cert#: Employer: Retired  Benefits: Phone #: [phone] Name:  Eff. Date: Deduct: Out of Pocket Max: Life Max:  CIR: SNF:  Outpatient: Co-Pay:  Home Health: Co-Pay:  DME: Co-Pay:  Emergency Contact Information  Contact Information    Name  Relation  Home  Work  Mobile    [place]  [place]   [phone]    Margaretha Seeds    [phone]    [place]  Other  [phone]   [phone]      Current Medical History  Patient Admitting Diagnosis: Deconditioned after multiple medical issues  History of Present Illness: A 80 y.o. male with history of DM, CAD, severe pulmonary HTN, CKD, A fib, recent admission for increase in dyspnea and angina 08/7-08/10/15. He was readmitted on 08/11/125 with generalized weakness, fall and dehydration. He was treated with IVF but developed acute on chronic diastolic HF due to rehydration. Foley placed due to recurrent urinary retention. He responded to IV diuresis and end of life issues discussed with patient and family. Patient has chosen aggressive treatment. SOB improving  on current regimen with BP controlled and chronic CKD stable. Patient deconditioned and CIR recommended by MD and therapy team.  Past Medical History  Past Medical History   Diagnosis  Date   .  Diabetes mellitus    .  Hyperlipidemia    .  Coronary artery disease    .  Spinal stenosis    .  Pneumonia    .  Pacemaker    .  Anemia    .  Carotid artery disease    .  PVD (peripheral vascular disease)    .  Hyponatremia      Chronic   .  CVA (cerebral infarction)    .  CKD (chronic kidney disease) stage 4, GFR 15-29 ml/min    .  Long term (current) use of anticoagulants      No bleeding on Eliquis   .  Dysrhythmia    .  PSVT (paroxysmal supraventricular tachycardia)    .  PAF (paroxysmal atrial fibrillation)      Asymptomatic. On Eliquis.   .  AV block, 3rd degree      With DDD St. Jude PM, initially placed in 1993 by Dr. Nils Pyle. Device upgrade 10/2002 to DDD, by Dr. Rollene Fare complicated by bleeding.   .  Essential hypertension, benign    .  CHF (congestive heart failure)    .  Chronic diastolic congestive heart failure      ECHO 05/20/12 LVEF estimated by 2D at 55-60%   .  Arthritis    .  Shortness of breath     Family History  family history includes Hypertension in his mother; Multiple myeloma in his father.  Prior Rehab/Hospitalizations: Had patella tendon repair 12 yrs ago. Had a TKR 20 years ago.  Current Medications  Current facility-administered medications:allopurinol (ZYLOPRIM) tablet 200 mg, 200 mg, Oral, Daily, Pixie Casino, MD, 200 mg at 12/29/13 1014; amiodarone (PACERONE) tablet 200 mg, 200 mg, Oral, Daily, Sueanne Margarita, MD, 200 mg at 12/29/13 1014; antiseptic oral rinse (CPC / CETYLPYRIDINIUM CHLORIDE 0.05%) solution 7 mL, 7 mL, Mouth Rinse, BID, Pixie Casino, MD, 7 mL at 12/29/13 1000  apixaban (ELIQUIS) tablet 2.5 mg, 2.5 mg, Oral, BID, Pixie Casino, MD, 2.5 mg at 12/29/13 1014; dutasteride (AVODART) capsule 0.5 mg, 0.5 mg, Oral, Daily, Tarri Fuller,  PA-C, 0.5 mg at 12/29/13 1014; ferrous sulfate tablet 325 mg, 325 mg, Oral, QODAY, Pixie Casino, MD, 325 mg at 12/28/13 1035; hydrALAZINE (APRESOLINE) tablet 25 mg, 25 mg, Oral, Q12H, Sueanne Margarita, MD, 25 mg at 12/29/13 1014  insulin aspart (novoLOG) injection 0-15 Units, 0-15 Units, Subcutaneous, TID WC, Belva Crome III, MD, 5 Units at 12/29/13 1214; insulin glargine (LANTUS) injection 12 Units, 12 Units, Subcutaneous, QHS, Pixie Casino, MD, 12 Units at 12/28/13 2127; isosorbide mononitrate (IMDUR) 24 hr tablet 30 mg, 30 mg, Oral, Daily, Pixie Casino, MD, 30 mg at 12/29/13 1014  latanoprost (XALATAN) 0.005 % ophthalmic solution 1 drop, 1 drop, Both Eyes, QHS, Pixie Casino, MD, 1 drop at 12/27/13 2200; loratadine (CLARITIN) tablet 10 mg, 10 mg, Oral, Daily, Pixie Casino, MD, 10 mg at 12/29/13 1014; metoprolol tartrate (LOPRESSOR) tablet 25 mg, 25 mg, Oral, Daily, Pixie Casino, MD, 25 mg at 12/29/13 1014  multivitamin with minerals tablet 1 tablet, 1 tablet, Oral, Daily, Pixie Casino, MD, 1 tablet at 12/29/13 1014; nitroGLYCERIN (NITROSTAT) SL tablet 0.4 mg, 0.4 mg, Sublingual, Q5 min PRN, Pixie Casino, MD, 0.4 mg at 12/23/13 1641; polyethylene glycol (MIRALAX / GLYCOLAX) packet 17 g, 17 g, Oral, BID PRN, Pixie Casino, MD, 17 g at 12/28/13 2204  polyvinyl alcohol (LIQUIFILM TEARS) 1.4 % ophthalmic solution 1 drop, 1 drop, Both Eyes, PRN, Pixie Casino, MD; potassium chloride SA (K-DUR,KLOR-CON) CR tablet 20 mEq, 20 mEq, Oral, Daily, Pixie Casino, MD, 20 mEq at 12/29/13 1020; silodosin (RAPAFLO) capsule 8 mg, 8 mg, Oral, Daily, [name] Hammons, RPH, 8 mg at 12/28/13 1800  sodium chloride 0.9 % injection 3 mL, 3 mL, Intravenous, Q12H, Pixie Casino, MD, 3 mL at 12/29/13 1016; torsemide (DEMADEX) tablet 80 mg, 80 mg, Oral, BID, Sueanne Margarita, MD, 80 mg at 12/29/13 1014  Patients Current Diet: Carb Control  Precautions / Restrictions  Precautions   Precautions: Fall  Spinal Brace: Lumbar corset;Applied in sitting position;Other (comment)  Spinal Brace Comments: long standing for comfort grossly 25years  Other Brace/Splint: AFO LLE (From old knee sx)  Restrictions  Weight Bearing Restrictions: No  Prior Activity Level  Community (5-7x/wk): Went to work [employer] each week up until last Friday..  Home Assistive Devices / [place] Devices/Equipment: CBG Meter  Home Equipment: Walker - 4 wheels;Shower seat  Prior Functional Level  Prior Function  Level of Independence: Needs assistance  Gait / Transfers Assistance Needed: Idependent with the rollator  ADL's / Homemaking Assistance Needed: Stepping in and out of shower needs assist, washing back needs assist  Current Functional Level  Cognition  Overall Cognitive Status:  Within Functional Limits for tasks assessed   Extremity Assessment  (includes Sensation/Coordination)  Upper Extremity Assessment: Defer to OT evaluation  Lower Extremity Assessment: LLE deficits/detail  LLE Deficits / Details: Foot drop and decreased sensation consistent with spinal stenosis and peroneal nerve damage.  Cervical / Trunk Assessment: Kyphotic   ADLs  Anticipate ADL needs   Mobility  Overal bed mobility: (Pt in recliner when PT arrived. )  Bed Mobility: Supine to Sit  Supine to sit: Min guard  General bed mobility comments: use of rail and increased time   Transfers  Overall transfer level: Needs assistance  Equipment used: Rolling walker (2 wheeled)  Transfers: Sit to/from Stand  Sit to Stand: Min assist  General transfer comment: Requires assist for increased forward weight shift/trunk lean with mod cues for hand placement and safety when sitting/standing.   Ambulation / Gait / Stairs / Wheelchair Mobility  Ambulation/Gait  Ambulation/Gait assistance: Min assist;Mod assist  Ambulation Distance (Feet): 70 Feet  Assistive device: Rolling walker (2 wheeled)  Gait Pattern/deviations:  Step-through pattern;Decreased stride length;Trunk flexed  Gait velocity: decreased  Gait velocity interpretation: Below normal speed for age/gender  General Gait Details: Pt continues to have decreased gait speed and generalized weakness, requiring min/mod assist for gait for safety. Note that knees did not buckle during gait with use of RW, however did note quad weakness in ability to extend knees during stance phase of gait.  Stairs: Yes  Stairs assistance: Mod assist  Stair Management: One rail Right;Step to pattern;Backwards  Number of Stairs: 6  General stair comments: Pt has 2 STE home with B handrails but also another 2 steps inside home with single handrail. Requires mod A for stair negotiation with single handrail and PT provided HHA on LUE for stability.   Posture / Balance  Overall balance assessment: Needs assistance  Sitting-balance support: Feet supported  Sitting balance-Leahy Scale: Fair  Postural control: Posterior lean  Standing balance support: During functional activity;Bilateral upper extremity supported  Standing balance-Leahy Scale: Poor  Standing balance comment: Requires min A to maintain balance with use of RW.   Special needs/care consideration  BiPAP/CPAP No  CPM No  Continuous Drip IV No  Dialysis No  Life Ve No  Oxygen No  Special Bed No  Trach Size No  Wound Vac (area) No  Skin No  Bowel mgmt: Last BM 12/29/13  Bladder mgmt: Using urinal with no incontinence  Diabetic mgmt Yes, on insulin 4 X a day   Previous Home Environment  Living Arrangements: Other relatives;Children  Available Help at Discharge: Family;Friend(s);Available 24 hours/day  Type of Home: House  Home Layout: One level (2 steps to get from one part of the house to another)  Home Access: Stairs to enter  Entrance Stairs-Rails: Right;Left;Can reach both  Entrance Stairs-Number of Steps: 2  Home Care Services: Yes  Type of Home Care Services: Sitter  Discharge Living Setting  Plans  for Discharge Living Setting: Patient's home;House;Lives with (comment) (Lives with son.)  Type of Home at Discharge: House  Discharge Home Layout: Two level;Able to live on main level with bedroom/bathroom  Alternate Level Stairs-Number of Steps: Flight  Discharge Home Access: Stairs to enter  Entrance Stairs-Number of Steps: 3  Does the patient have any problems obtaining your medications?: No  Social/Family/Support Systems  Patient Roles: Parent (Widower. Has 5 daughters and 2 sons.)  Contact Information: Lindwood Qua III - son  Anticipated Caregiver: Ollen Gross and caregiver friend  Anticipated Caregiver's Contact Information: [place] -  son (h) [phone] (c) [phone]  Ability/Limitations of Caregiver: Son currently not working, living with patient and can assist. Has a male caregiver who can assist as needed.  Caregiver Availability: 24/7  Discharge Plan Discussed with Primary Caregiver: Yes  Is Caregiver In Agreement with Plan?: Yes  Does Caregiver/Family have Issues with Lodging/Transportation while Pt is in Rehab?: No  Goals/Additional Needs  Patient/Family Goal for Rehab: PT/OT mod I to supervision goals  Expected length of stay: 8-12 days  Cultural Considerations: Attends [employer].  Dietary Needs: Heart healthy, thin liquids  Equipment Needs: TBD  Pt/Family Agrees to Admission and willing to participate: Yes  Program Orientation Provided & Reviewed with Pt/Caregiver Including Roles & Responsibilities: Yes  Decrease burden of Care through IP rehab admission: N/A  Possible need for SNF placement upon discharge: Not anticipated  Patient Condition: This patient's condition remains as documented in the consult dated 12/29/13, in which the Rehabilitation Physician determined and documented that the patient's condition is appropriate for intensive rehabilitative care in an inpatient rehabilitation facility. Will admit to inpatient rehab today.  Preadmission Screen Completed  By: Retta Diones, 12/29/2013 3:00 PM  ______________________________________________________________________  Discussed status with Dr. Naaman Plummer on 12/29/13 at 1458 and received telephone approval for admission today.  Admission Coordinator: Retta Diones, time1458/Date08/18/15  Cosigned by: Meredith Staggers, MD [12/29/2013 3:04 PM]

## 2013-12-29 NOTE — Progress Notes (Signed)
Physical Medicine and Rehabilitation Consult  Reason for Consult: Deconditioning  Referring Physician: Dr. Debara Pickett  HPI: [name] is a 80 y.o. male with history of DM, CAD, severe pulmonary HTN, CKD, A fib, recent admission for increase in dyspnea and angina 08/7-08/10/15. He was readmitted on 08/11/125 with generalized weakness, fall and dehydration. He was treated with IVF but developed acute on chronic diastolic HF due to rehydration. Foley placed due to recurrent urinary retention. He responded to IV diuresis and end of life issues discussed with patient and family. Patient has chosen aggressive treatment. SOB improving on current regimen with BP controlled and chronic CKD stable. Patient deconditioned and CIR recommended by MD and therapy team.  Review of Systems  HENT: Negative for hearing loss.  Eyes: Negative for blurred vision and double vision.  Respiratory: Negative for cough and shortness of breath.  Cardiovascular: Negative for chest pain, palpitations and leg swelling.  Gastrointestinal: Negative for heartburn and abdominal pain.  Genitourinary: Negative for urgency and frequency.  Musculoskeletal: Positive for myalgias.  Neurological: Negative for dizziness and headaches.  Psychiatric/Behavioral: Negative for depression. The patient does not have insomnia.   Past Medical History   Diagnosis  Date   .  Diabetes mellitus    .  Hyperlipidemia    .  Coronary artery disease    .  Spinal stenosis    .  Pneumonia    .  Pacemaker    .  Anemia    .  Carotid artery disease    .  PVD (peripheral vascular disease)    .  Hyponatremia      Chronic   .  CVA (cerebral infarction)    .  CKD (chronic kidney disease) stage 4, GFR 15-29 ml/min    .  Long term (current) use of anticoagulants      No bleeding on Eliquis   .  Dysrhythmia    .  PSVT (paroxysmal supraventricular tachycardia)    .  PAF (paroxysmal atrial fibrillation)      Asymptomatic. On Eliquis.   .  AV block, 3rd  degree      With DDD St. Jude PM, initially placed in 1993 by Dr. Nils Pyle. Device upgrade 10/2002 to DDD, by Dr. Rollene Fare complicated by bleeding.   .  Essential hypertension, benign    .  CHF (congestive heart failure)    .  Chronic diastolic congestive heart failure      ECHO 05/20/12 LVEF estimated by 2D at 55-60%   .  Arthritis    .  Shortness of breath     Past Surgical History   Procedure  Laterality  Date   .  Lumbar laminectomy   1995   .  Back surgery     .  Total knee arthroplasty  Left    .  Quadriceps tendon repair     .  Cataract extraction     .  Carotid endarterectomy     .  Yag laser application  Right  9/56/2130     Procedure: YAG LASER CAPSULOTOMY OF RIGHT EYE; Surgeon: [name], MD; Location: [place]; Service: Ophthalmology; Laterality: Right;   .  Tonsillectomy     .  Insert / replace / remove pacemaker       DDD St. Jude PM, initially placed in 1993 by Dr. Nils Pyle. Device upgrade 10/2002 to DDD, by Dr. Rollene Fare complicated by bleeding.   .  Carotid endarterectomy  Right  2006   .  Transurethral resection of prostate     .  Cardioversion  N/A  06/16/2013     Procedure: CARDIOVERSION BEDSIDE; Surgeon: [name], MD; Location: [facility] OR; Service: Cardiovascular; Laterality: N/A;    Family History   Problem  Relation  Age of Onset   .  Multiple myeloma  Father    .  Hypertension  Mother     Social History: Lives alone. Still works four days a week. 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 Plans to hire someone? Per reports that he quit smoking about 55 years ago. His smoking use included Pipe. He has never used smokeless tobacco. He reports that he does not drink alcohol or use illicit drugs.  Allergies   Allergen  Reactions   .  Aggrenox [Aspirin-Dipyridamole Er]  Other (See Comments)     Dizziness   .  Carvedilol  Other (See Comments)     Dizziness   .  Claritin [Loratadine]  Other (See Comments)     Dizziness & drowsiness   .   Mucinex [Guaifenesin Er]  Other (See Comments)     Dizziness, drowsiness   .  Plavix [Clopidogrel Bisulfate]  Other (See Comments)     Dizziness   .  Rapaflo [Silodosin]  Other (See Comments)     Dizziness for about 30- 45 minutes   .  Statins  Other (See Comments)     rhabdomyolisis   .  Travatan Z [Travoprost]  Other (See Comments)     Eye irritation    Medications Prior to Admission   Medication  Sig  Dispense  Refill   .  allopurinol (ZYLOPRIM) 100 MG tablet  Take 200 mg by mouth daily.     [name]  amiodarone (PACERONE) 200 MG tablet  Take 200 mg by mouth daily.     [name]  apixaban (ELIQUIS) 2.5 MG TABS tablet  Take 2.5 mg by mouth 2 (two) times daily.     [name]  dutasteride (AVODART) 0.5 MG capsule  Take 0.5 mg by mouth daily.     .  ferrous sulfate 325 (65 FE) MG tablet  Take 325 mg by mouth every other day.     .  fexofenadine (ALLEGRA) 180 MG tablet  Take 180 mg by mouth daily.     .  Flaxseed, Linseed, (BL FLAX SEED OIL PO)  Take 1 capsule by mouth 2 (two) times daily.     .  hydrALAZINE (APRESOLINE) 25 MG tablet  Take 25 mg by mouth every 12 (twelve) hours.     .  insulin aspart (NOVOLOG FLEXPEN) 100 UNIT/ML FlexPen  Inject 2-9 Units into the skin 3 (three) times daily with meals. Sliding scale     .  insulin glargine (LANTUS) 100 UNIT/ML injection  Inject 12 Units into the skin at bedtime.     .  isosorbide mononitrate (IMDUR) 60 MG 24 hr tablet  Take 1 tablet (60 mg total) by mouth daily.  30 tablet  5   .  metoprolol tartrate (LOPRESSOR) 25 MG tablet  Take 1 tablet (25 mg total) by mouth daily.  30 tablet  6   .  Multiple Vitamin (MULTIVITAMIN WITH MINERALS) TABS  Take 1 tablet by mouth daily. Diabetic pak by Petra Kuba from [employer]     .  nitroGLYCERIN (NITROSTAT) 0.4 MG SL tablet  Place 1 tablet (0.4 mg total) under the tongue every 5 (five) minutes as needed for chest pain.  25 tablet  3   .  Polyethyl Glycol-Propyl Glycol (SYSTANE) 0.4-0.3 % SOLN  Place 1 drop into both eyes daily as needed  (dry eyes). Into affected eyes     .  polyethylene glycol (MIRALAX / GLYCOLAX) packet  Take 17 g by mouth 2 (two) times daily as needed for mild constipation. For constipation     .  Polyvinyl Alcohol-Povidone (REFRESH OP)  Place 1 drop into both eyes 2 (two) times daily. Refresh Gel Opth     .  potassium chloride SA (K-DUR,KLOR-CON) 20 MEQ tablet  Take 1 tablet (20 mEq total) by mouth daily.  30 tablet  6   .  ranolazine (RANEXA) 500 MG 12 hr tablet  Take 1 tablet (500 mg total) by mouth 2 (two) times daily.  60 tablet  5   .  silodosin (RAPAFLO) 8 MG CAPS capsule  Take 8 mg by mouth daily with breakfast.     .  Tafluprost (ZIOPTAN) 0.0015 % SOLN  Place 1 drop into both eyes at bedtime. Use at night     .  torsemide (DEMADEX) 20 MG tablet  Take 4 tablets (80 mg total) by mouth 2 (two) times daily. Take 4 tablets in the morning and 2 tablets in the afternoon.  240 tablet  5    Home:  Home Living  Family/patient expects to be discharged to:: Private residence  Living Arrangements: Other relatives;Children  Available Help at Discharge: Family;Friend(s);Available 24 hours/day  Type of Home: House  Home Access: Stairs to enter  [employer] of Steps: 2  Entrance Stairs-Rails: Right;Left;Can reach both  Home Layout: One level (2 steps to get from one part of the house to another)  Home Equipment: Walker - 4 wheels;Shower seat  Functional History:  Prior Function  Level of Independence: Needs assistance  Gait / Transfers Assistance Needed: Idependent with the rollator  ADL's / Homemaking Assistance Needed: Stepping in and out of shower needs assist, washing back needs assist  Functional Status:  Mobility:  Bed Mobility  Overal bed mobility: (Pt in recliner when PT arrived. )  Bed Mobility: Supine to Sit  Supine to sit: Min guard  General bed mobility comments: use of rail and increased time  Transfers  Overall transfer level: Needs assistance  Equipment used: Rolling walker (2  wheeled)  Transfers: Sit to/from Stand  Sit to Stand: Min assist  General transfer comment: Requires assist for increased forward weight shift/trunk lean with mod cues for hand placement and safety when sitting/standing.  Ambulation/Gait  Ambulation/Gait assistance: Min assist;Mod assist  Ambulation Distance (Feet): 70 Feet  Assistive device: Rolling walker (2 wheeled)  Gait Pattern/deviations: Step-through pattern;Decreased stride length;Trunk flexed  Gait velocity: decreased  Gait velocity interpretation: Below normal speed for age/gender  General Gait Details: Pt continues to have decreased gait speed and generalized weakness, requiring min/mod assist for gait for safety. Note that knees did not buckle during gait with use of RW, however did note quad weakness in ability to extend knees during stance phase of gait.  Stairs: Yes  Stairs assistance: Mod assist  Stair Management: One rail Right;Step to pattern;Backwards  Number of Stairs: 6  General stair comments: Pt has 2 STE home with B handrails but also another 2 steps inside home with single handrail. Requires mod A for stair negotiation with single handrail and PT provided HHA on LUE for stability.   ADL:   Cognition:  Cognition  Overall Cognitive Status: Within Functional Limits for tasks assessed  Cognition  Arousal/Alertness: Awake/alert  Behavior During  Therapy: WFL for tasks assessed/performed  Overall Cognitive Status: Within Functional Limits for tasks assessed  Blood pressure 133/57, pulse 64, temperature 97.8 F (36.6 C), temperature source Oral, resp. rate 18, height _0  (1.702 m), weight 84.687 kg (186 lb 11.2 oz), SpO2 98.00%.  Physical Exam  Nursing note and vitals reviewed.  Constitutional: He is oriented to person, place, and time. He appears well-developed and well-nourished.  Sitting at EOB without difficulty.  HENT:  Head: Normocephalic and atraumatic.  Right Ear: External ear normal.  Left Ear: External  ear normal.  Eyes: Conjunctivae are normal. Pupils are equal, round, and reactive to light.  Neck: Normal range of motion. Neck supple. No JVD present. No tracheal deviation present. No thyromegaly present.  Cardiovascular: Normal rate and regular rhythm.  No murmur heard.  Respiratory: Breath sounds normal. No respiratory distress. He has no wheezes.  GI: Soft. Bowel sounds are normal. He exhibits no distension. There is no tenderness.  Musculoskeletal: He exhibits no edema.  Some tightness bilateral knees. Well healed old L-TKR incision. Left foot drop.  Neurological: He is alert and oriented to person, place, and time. He displays normal reflexes. No cranial nerve deficit.  UES: 5/5 biceps, triceps, wrist, HI. LES: 3+ HF, 4- KE, 4+ Right ADF/APF. Left ADF: 1/5, left APF 2+/5. Mild sensory loss over lateral foot and lower leg  Skin: Skin is warm and dry.  Psychiatric: He has a normal mood and affect. His behavior is normal. Judgment and thought content normal.   Results for orders placed during the hospital encounter of 12/22/13 (from the past 24 hour(s))   GLUCOSE, CAPILLARY Status: Abnormal    Collection Time    12/28/13 11:18 AM   Result  Value  Ref Range    Glucose-Capillary  231 (*)  70 - 99 mg/dL   GLUCOSE, CAPILLARY Status: Abnormal    Collection Time    12/28/13 3:50 PM   Result  Value  Ref Range    Glucose-Capillary  248 (*)  70 - 99 mg/dL    Comment 1  Notify RN    GLUCOSE, CAPILLARY Status: Abnormal    Collection Time    12/28/13 9:25 PM   Result  Value  Ref Range    Glucose-Capillary  169 (*)  70 - 99 mg/dL    Comment 1  Notify RN     Comment 2  Documented in Chart    GLUCOSE, CAPILLARY Status: Abnormal    Collection Time    12/29/13 5:37 AM   Result  Value  Ref Range    Glucose-Capillary  195 (*)  70 - 99 mg/dL    No results found.  Assessment/Plan:  Diagnosis: deconditioning after multiple medical issues  1. Does the need for close, 24 hr/day medical  supervision in concert with the patient's rehab needs make it unreasonable for this patient to be served in a less intensive setting? Yes 2. Co-Morbidities requiring supervision/potential complications: htn, PAF, CKD, CHF 3. Due to bladder management, bowel management, safety, skin/wound care, disease management, medication administration, pain management and patient education, does the patient require 24 hr/day rehab nursing? Yes 4. Does the patient require coordinated care of a physician, rehab nurse, PT (1-2 hrs/day, 5 days/week) and OT (1-2 hrs/day, 5 days/week) to address physical and functional deficits in the context of the above medical diagnosis(es)? Yes Addressing deficits in the following areas: balance, endurance, locomotion, strength, transferring, bowel/bladder control, bathing, dressing, feeding, grooming and toileting 5. Can the patient actively  participate in an intensive therapy program of at least 3 hrs of therapy per day at least 5 days per week? Yes 6. The potential for patient to make measurable gains while on inpatient rehab is excellent 7. Anticipated functional outcomes upon discharge from inpatient rehab are modified independent and supervision with PT, modified independent and supervision with OT, n/a with SLP. 8. Estimated rehab length of stay to reach the above functional goals is: 8-12 days 9. Does the patient have adequate social supports to accommodate these discharge functional goals? Yes 10. Anticipated D/C setting: Home 11. Anticipated post D/C treatments: HH therapy and Outpatient therapy 12. Overall Rehab/Functional Prognosis: excellent RECOMMENDATIONS:  This patient's condition is appropriate for continued rehabilitative care in the following setting: CIR  Patient has agreed to participate in recommended program. Yes  Note that insurance prior authorization may be required for reimbursement for recommended care.  Comment: This surprisingly spry 80 yo male would  do quite well in our program. Rehab Admissions Coordinator to follow up.  Thanks,  Meredith Staggers, MD, Mellody Drown  12/29/2013  Revision History...      Date/Time User Action    12/29/2013 10:59 AM Meredith Staggers, MD Sign    12/29/2013 9:27 AM Bary Leriche, PA-C Share   View Details Report    Routing History.Marland KitchenMarland Kitchen

## 2013-12-29 NOTE — Progress Notes (Signed)
Rehab admissions - I met with patient, son and extended family.  All are in agreement to inpatient rehab admission.  Bed available and will admit to inpatient rehab today.  Call me for questions.  #[phone]

## 2013-12-29 NOTE — Progress Notes (Signed)
UR completed Parsa Rickett K. Savion Washam, RN, BSN, [place], CCM  12/29/2013 3:48 PM

## 2013-12-30 ENCOUNTER — Inpatient Hospital Stay (HOSPITAL_COMMUNITY): Payer: Medicare Other | Admitting: Physical Therapy

## 2013-12-30 ENCOUNTER — Inpatient Hospital Stay (HOSPITAL_COMMUNITY): Payer: Medicare Other | Admitting: Occupational Therapy

## 2013-12-30 ENCOUNTER — Encounter (HOSPITAL_COMMUNITY): Payer: Medicare Other | Admitting: Occupational Therapy

## 2013-12-30 DIAGNOSIS — Z5189 Encounter for other specified aftercare: Secondary | ICD-10-CM

## 2013-12-30 DIAGNOSIS — N189 Chronic kidney disease, unspecified: Secondary | ICD-10-CM

## 2013-12-30 DIAGNOSIS — N179 Acute kidney failure, unspecified: Secondary | ICD-10-CM

## 2013-12-30 DIAGNOSIS — I4891 Unspecified atrial fibrillation: Secondary | ICD-10-CM

## 2013-12-30 DIAGNOSIS — R5381 Other malaise: Secondary | ICD-10-CM

## 2013-12-30 LAB — COMPREHENSIVE METABOLIC PANEL
ALBUMIN: 2.9 g/dL — AB (ref 3.5–5.2)
ALK PHOS: 55 U/L (ref 39–117)
ALT: 58 U/L — AB (ref 0–53)
ANION GAP: 12 (ref 5–15)
AST: 48 U/L — ABNORMAL HIGH (ref 0–37)
BUN: 35 mg/dL — AB (ref 6–23)
CALCIUM: 9.2 mg/dL (ref 8.4–10.5)
CO2: 32 mEq/L (ref 19–32)
Chloride: 97 mEq/L (ref 96–112)
Creatinine, Ser: 2.3 mg/dL — ABNORMAL HIGH (ref 0.50–1.35)
GFR calc Af Amer: 27 mL/min — ABNORMAL LOW (ref >90)
GFR calc non Af Amer: 23 mL/min — ABNORMAL LOW (ref >90)
GLUCOSE: 116 mg/dL — AB (ref 70–99)
POTASSIUM: 3.6 meq/L — AB (ref 3.7–5.3)
Sodium: 141 mEq/L (ref 137–147)
TOTAL PROTEIN: 6.4 g/dL (ref 6.0–8.3)
Total Bilirubin: 0.3 mg/dL (ref 0.3–1.2)

## 2013-12-30 LAB — URINALYSIS, ROUTINE W REFLEX MICROSCOPIC
Bilirubin Urine: NEGATIVE
Glucose, UA: NEGATIVE mg/dL
Hgb urine dipstick: NEGATIVE
KETONES UR: NEGATIVE mg/dL
Leukocytes, UA: NEGATIVE
NITRITE: NEGATIVE
PROTEIN: NEGATIVE mg/dL
Specific Gravity, Urine: 1.012 (ref 1.005–1.030)
Urobilinogen, UA: 1 mg/dL (ref 0.0–1.0)
pH: 6.5 (ref 5.0–8.0)

## 2013-12-30 LAB — GLUCOSE, CAPILLARY
GLUCOSE-CAPILLARY: 118 mg/dL — AB (ref 70–99)
GLUCOSE-CAPILLARY: 184 mg/dL — AB (ref 70–99)
Glucose-Capillary: 283 mg/dL — ABNORMAL HIGH (ref 70–99)
Glucose-Capillary: 285 mg/dL — ABNORMAL HIGH (ref 70–99)
Glucose-Capillary: 178 mg/dL — ABNORMAL HIGH (ref 70–99)

## 2013-12-30 LAB — CBC WITH DIFFERENTIAL/PLATELET
BASOS PCT: 0 % (ref 0–1)
Basophils Absolute: 0 10*3/uL (ref 0.0–0.1)
EOS ABS: 0.2 10*3/uL (ref 0.0–0.7)
EOS PCT: 3 % (ref 0–5)
HCT: 33.2 % — ABNORMAL LOW (ref 39.0–52.0)
Hemoglobin: 11.1 g/dL — ABNORMAL LOW (ref 13.0–17.0)
LYMPHS ABS: 1.7 10*3/uL (ref 0.7–4.0)
Lymphocytes Relative: 28 % (ref 12–46)
MCH: 32.5 pg (ref 26.0–34.0)
MCHC: 33.4 g/dL (ref 30.0–36.0)
MCV: 97.1 fL (ref 78.0–100.0)
Monocytes Absolute: 0.5 10*3/uL (ref 0.1–1.0)
Monocytes Relative: 9 % (ref 3–12)
NEUTROS PCT: 60 % (ref 43–77)
Neutro Abs: 3.7 10*3/uL (ref 1.7–7.7)
PLATELETS: 219 10*3/uL (ref 150–400)
RBC: 3.42 MIL/uL — ABNORMAL LOW (ref 4.22–5.81)
RDW: 14.9 % (ref 11.5–15.5)
WBC: 6.1 10*3/uL (ref 4.0–10.5)

## 2013-12-30 MED ORDER — POTASSIUM CHLORIDE CRYS ER 20 MEQ PO TBCR
20.0000 meq | EXTENDED_RELEASE_TABLET | Freq: Every day | ORAL | Status: AC
  Administered 2013-12-30 – 2013-12-31 (×2): 20 meq via ORAL

## 2013-12-30 MED ORDER — TAFLUPROST 0.0015 % OP SOLN
1.0000 [drp] | Freq: Every day | OPHTHALMIC | Status: DC
  Administered 2013-12-30 – 2014-01-01 (×3): 1 [drp] via OPHTHALMIC

## 2013-12-30 MED ORDER — CARBOXYMETHYLCELLULOSE SODIUM 1 % OP GEL: 1.0000 [drp] | OPHTHALMIC | Status: DC | PRN

## 2013-12-30 NOTE — Patient Care Conference (Signed)
Inpatient RehabilitationTeam Conference and Plan of Care Update Date: 12/30/2013   Time: 11;30 AM    Patient Name: [name]      Medical Record Number: [id number]  Date of Birth: 11-30-1918 Sex: Male         Room/Bed: 4M08C/4M08C-01 Payor Info: Payor: MEDICARE / Plan: MEDICARE PART A AND B / Product Type: *No Product type* /    Admitting Diagnosis: Deconditioned   Admit Date/Time:  12/29/2013  4:20 PM Admission Comments: No comment available   Primary Diagnosis:  <principal problem not specified> Principal Problem: <principal problem not specified>  Patient Active Problem List   Diagnosis Date Noted  . Acute on chronic combined systolic and diastolic congestive heart failure 12/29/2013  . Physical deconditioning 12/29/2013  . Dehydration 12/22/2013  . Angina at rest 12/21/2013  . HF (heart failure) 12/18/2013  . (HFpEF) heart failure with preserved ejection fraction 12/18/2013  . Solitary pulmonary nodule 10/09/2013  . Acute on chronic diastolic CHF (congestive heart failure), NYHA class 4 09/13/2013  . CKD (chronic kidney disease) stage 4, GFR 15-29 ml/min   . Hilar lymphadenopathy 08/10/2013  . Acute on chronic renal failure 08/05/2013  . Acute on chronic diastolic congestive heart failure, NYHA class 4 08/03/2013  . Encounter for long-term (current) use of other medications 06/25/2013  . Urinary retention, now with foley cath and leg bag. 06/18/2013  . Acute diastolic CHF (congestive heart failure) 06/18/2013  . Chronic anticoagulation 04/03/2013    Class: Chronic  . Pacemaker - St Jude 04/03/2013    Class: Chronic  . Paroxysmal atrial fibrillation, DCCV 06/16/13 08/22/2012    Class: Chronic  . CKD (chronic kidney disease), stage III 08/18/2012  . Hypertension   . Diabetes mellitus     Expected Discharge Date: Expected Discharge Date: 01/02/14  Team Members Present: Physician leading conference: Dr. Alysia Penna Social Worker Present: Ovidio Kin, LCSW Nurse  Present: Rayetta Humphrey, RN PT Present: Cameron Sprang, PT;Teri Diltz Jari Favre, PT OT Present: Willeen Cass, Maryella Shivers, OT SLP Present: Gunnar Fusi, SLP PPS Coordinator present : Ileana Ladd, PT     Current Status/Progress Goal Weekly Team Focus  Medical   Diabetes controlled, chronic renal failure  Maintain medical stability to maximize rehabilitation progress  Initiate therapy program, build endurance   Bowel/Bladder   continet b/b LBM 8/18; miralax BID prn  remain contient with regular emptying pattern  assess bowl pattern and adminsiter prn medications as needed   Swallow/Nutrition/ Hydration     [facility]        ADL's   Supervision for UB selfcare, min to mod assist for LB selfcare, min guard for functional transfers and toileting tasks  supervision for transfers and toileting tasks  selfcare re-training, balance re-training,  AE/DME education   Mobility   Min guard for gait with RW up to 100 ft, transfers, and 5 stairs with 2 rails; supervision bed mobility  mod I to supervision  Activity tolerance, generalized strengthening, balance   Communication     [facility]        Safety/Cognition/ Behavioral Observations    No unsafe behaviors        Pain   no c/o pain; tylenol 650mg  [id number]  pain remain at 0  assess pain periodically and treat is need arises   Skin     monitor skin-no issues at this time           *See Care Plan and progress notes for long and short-term goals.  Barriers to Discharge:  Several chronic medical conditions, advanced age    Possible Resolutions to Barriers:  Continue rehabilitation, educate caregiver    Discharge Planning/Teaching Needs:    Home with son and assistant to assist as was prior to admission.  Doing well and will be very short length of stay.     Team Discussion:  Doing well on his first day of therapies, will be here short time.  Will try rollator rolling walker uses at home this afternoon, to make sure safe using.  Close to baseline  functioning.  Assistant here to observe in therapies.  Revisions to Treatment Plan:  New eval   Continued Need for Acute Rehabilitation Level of Care: The patient requires daily medical management by a physician with specialized training in physical medicine and rehabilitation for the following conditions: Daily direction of a multidisciplinary physical rehabilitation program to ensure safe treatment while eliciting the highest outcome that is of practical value to the patient.: Yes Daily medical management of patient stability for increased activity during participation in an intensive rehabilitation regime.: Yes Daily analysis of laboratory values and/or radiology reports with any subsequent need for medication adjustment of medical intervention for : Other  Elease Hashimoto 12/31/2013, 8:42 AM

## 2013-12-30 NOTE — Progress Notes (Signed)
Occupational Therapy Session Note  Patient Details  Name: [name] MRN: [id number] Date of Birth: January 31, 1919  Today's Date: 12/30/2013 OT Individual Time: 1300-1400 OT Individual Time Calculation (min): 60 min   Short Term Goals: Week 1:  OT Short Term Goal 1 (Week 1): LTGs equal to supervision level seconary to ELOS.  Skilled Therapeutic Interventions/Progress Updates:   Pt began session by ambulating with the RW to the shower/tub room with min guard assist.  Practiced stepping over the edge of the tub however pt unable to complete so integrated the tub shower bench with min guard assist.  Feel he will need this for home as well.  Progressed to practicing toilet transfers on the elevated toilet.  Pt reports his toilet at home being higher than this one.  Min assist needed for sit to stand secondary to not having UE support.  Discussed having grab bar installed to assist with smoother transition to sitting and to standing.  Pt then ambulated to the dayroom without stopping but reported needing to sit down once we reached the chair.  Oxygen sats 98% on room air and HR 74 after sitting down.  Focused remainder of the session on standing balance while engaged in putting activity.  Pt requiring mod assist for standing balance without UE support.  Frequent LOB posteriorly without balance reaction to self correct.  Performed 3 intervals of standing during activity for 2-3 mins each.  Ambulated back to the room with min guard assist to conclude session with pt needing one rest break, sitting on the rollator for 2-3 mins.   Therapy Documentation Precautions:  Precautions Precautions: Fall Required Braces or Orthoses: Other Brace/Splint;Spinal Brace Spinal Brace: Lumbar corset;Applied in sitting position;Other (comment) Spinal Brace Comments: long standing for comfort grossly 25years Other Brace/Splint: AFO LLE Restrictions Weight Bearing Restrictions: No  Vital Signs: Therapy Vitals Temp:  97.5 F (36.4 C) Temp src: Oral Pulse Rate: 74 Resp: 18 BP: 117/61 mmHg Patient Position (if appropriate): Sitting Oxygen Therapy SpO2: 99 % O2 Device: None (Room air) Pain: Pain Assessment Pain Assessment: No/denies pain ADL: See FIM for current functional status  Therapy/Group: Individual Therapy  Dorlene Footman OTR/L 12/30/2013, 3:59 PM

## 2013-12-30 NOTE — Evaluation (Signed)
Physical Therapy Assessment and Plan  Patient Details  Name: BREVAN LUBERTO MRN: [id number] Date of Birth: 1919-05-04  PT Diagnosis: Difficulty walking, Impaired sensation and Muscle weakness, Decreased endurance Rehab Potential: Excellent ELOS: 5-7 days   Today's Date: 12/30/2013 PT Individual Time: 0800-0900 PT Individual Time Calculation (min): 60 min    Problem List:  Patient Active Problem List   Diagnosis Date Noted  . Acute on chronic combined systolic and diastolic congestive heart failure 12/29/2013  . Physical deconditioning 12/29/2013  . Dehydration 12/22/2013  . Angina at rest 12/21/2013  . HF (heart failure) 12/18/2013  . (HFpEF) heart failure with preserved ejection fraction 12/18/2013  . Solitary pulmonary nodule 10/09/2013  . Acute on chronic diastolic CHF (congestive heart failure), NYHA class 4 09/13/2013  . CKD (chronic kidney disease) stage 4, GFR 15-29 ml/min   . Hilar lymphadenopathy 08/10/2013  . Acute on chronic renal failure 08/05/2013  . Acute on chronic diastolic congestive heart failure, NYHA class 4 08/03/2013  . Encounter for long-term (current) use of other medications 06/25/2013  . Urinary retention, now with foley cath and leg bag. 06/18/2013  . Acute diastolic CHF (congestive heart failure) 06/18/2013  . Chronic anticoagulation 04/03/2013    Class: Chronic  . Pacemaker - St Jude 04/03/2013    Class: Chronic  . Paroxysmal atrial fibrillation, DCCV 06/16/13 08/22/2012    Class: Chronic  . CKD (chronic kidney disease), stage III 08/18/2012  . Hypertension   . Diabetes mellitus     Past Medical History:  Past Medical History  Diagnosis Date  . Diabetes mellitus   . Hyperlipidemia   . Coronary artery disease   . Spinal stenosis   . Pneumonia   . Pacemaker   . Anemia   . Carotid artery disease   . PVD (peripheral vascular disease)   . Hyponatremia     Chronic  . CVA (cerebral infarction)   . CKD (chronic kidney disease) stage 4, GFR  15-29 ml/min   . Long term (current) use of anticoagulants     No bleeding on Eliquis  . Dysrhythmia   . PSVT (paroxysmal supraventricular tachycardia)   . PAF (paroxysmal atrial fibrillation)     Asymptomatic. On Eliquis.  . AV block, 3rd degree     With DDD St. Jude PM, initially placed in 1993 by Dr. Nils Pyle. Device upgrade 10/2002 to DDD, by Dr. Rollene Fare complicated by bleeding.  . Essential hypertension, benign   . CHF (congestive heart failure)   . Chronic diastolic congestive heart failure     ECHO 05/20/12 LVEF estimated by 2D at 55-60%  . Arthritis   . Shortness of breath    Past Surgical History:  Past Surgical History  Procedure Laterality Date  . Lumbar laminectomy  1995  . Back surgery    . Total knee arthroplasty Left   . Quadriceps tendon repair    . Cataract extraction    . Carotid endarterectomy    . Yag laser application Right 6/54/6503    Procedure: YAG LASER CAPSULOTOMY OF RIGHT EYE;  Surgeon: [name], MD;  Location: [place];  Service: Ophthalmology;  Laterality: Right;  . Tonsillectomy    . Insert / replace / remove pacemaker      DDD St. Jude PM, initially placed in 1993 by Dr. Nils Pyle. Device upgrade 10/2002 to DDD, by Dr. Rollene Fare complicated by bleeding.  . Carotid endarterectomy Right 2006  . Transurethral resection of prostate    . Cardioversion N/A 06/16/2013  Procedure: CARDIOVERSION BEDSIDE;  Surgeon: [name], MD;  Location: [facility] OR;  Service: Cardiovascular;  Laterality: N/A;    Assessment & Plan Clinical Impression: CLEOFAS HUDGINS is a 80 y.o. male with history of DM, CAD, severe pulmonary HTN, CKD, A fib, recent admission for increase in dyspnea and angina 08/7-08/10/15. He was readmitted on 08/11/125 with generalized weakness, fall and dehydration. He was treated with IVF but developed acute on chronic diastolic HF due to rehydration. Foley placed due to recurrent urinary retention. He responded to IV diuresis and end of life  issues discussed with patient and family. Patient has chosen aggressive treatment. SOB improving on current regimen with BP controlled and chronic CKD stable. Foley discontinued and patient voiding without difficulty. Patient transferred to CIR on 12/29/2013 .   Patient currently requires min with mobility secondary to muscle weakness and decreased cardiorespiratoy endurance.  Prior to hospitalization, patient was modified independent  with mobility and lived with Family (Son and caregiver [name]) in a House home.  Home access is 3Stairs to enter.  Patient will benefit from skilled PT intervention to maximize safe functional mobility, minimize fall risk and decrease caregiver burden for planned discharge home with intermittent assist.  Anticipate patient will not need PT follow up at discharge.  PT - End of Session Activity Tolerance: Decreased this session;Tolerates 30+ min activity with multiple rests Endurance Deficit: Yes PT Assessment Rehab Potential: Excellent Barriers to Discharge: Inaccessible home environment PT Patient demonstrates impairments in the following area(s): Balance;Endurance;Motor;Safety;Sensory PT Transfers Functional Problem(s): Bed Mobility;Bed to Chair;Car;Furniture PT Locomotion Functional Problem(s): Ambulation;Stairs PT Plan PT Intensity: Minimum of 1-2 x/day ,45 to 90 minutes PT Frequency: 5 out of 7 days PT Duration Estimated Length of Stay: 5-7 days PT Treatment/Interventions: Ambulation/gait training;Balance/vestibular training;Discharge planning;DME/adaptive equipment instruction;Functional mobility training;Neuromuscular re-education;Patient/family education;Stair training;Therapeutic Activities;Therapeutic Exercise;UE/LE Strength taining/ROM;UE/LE Coordination activities PT Transfers Anticipated Outcome(s): mod I PT Locomotion Anticipated Outcome(s): mod I to supervision PT Recommendation Follow Up Recommendations: None Patient destination: Home Equipment  Recommended: None recommended by PT Equipment Details: Pt has rollator, may need tub bench  Skilled Therapeutic Intervention Skilled therapeutic intervention initiated after completion of evaluation. Discussed with patient and caregiver falls risk, safety within room, and focus of therapy during stay. Discussed possible LOS, goals, and f/u therapy.  PT Evaluation Precautions/Restrictions Precautions Precautions: Fall Required Braces or Orthoses: Other Brace/Splint;Spinal Brace Spinal Brace: Lumbar corset;Applied in sitting position;Other (comment) Spinal Brace Comments: long standing for comfort grossly 25years Other Brace/Splint: AFO LLE (From old knee sx) Restrictions Weight Bearing Restrictions: No General Chart Reviewed: Yes Family/Caregiver Present: Yes  Pain Pain Assessment Pain Assessment: No/denies pain Home Living/Prior Functioning Home Living Available Help at Discharge: Family;Friend(s);Available 24 hours/day;Personal care attendant Type of Home: House Home Access: Stairs to enter [employer] of Steps: 3 Entrance Stairs-Rails: Right;Left;Can reach both Home Layout: One level (2 steps to get from one part of the house to another)  Lives With: Family (Son and caregiver [name]) Prior Function Level of Independence: Requires assistive device for independence;Independent with gait;Independent with transfers  Able to Take Stairs?: Yes Driving: No (caregiver drives him to work) Vocation: Full time employment 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) Leisure: Hobbies-yes (Comment) Comments: used to golf, plays computer games, uses computer Vision/Perception   No changes from baseline  Cognition Overall Cognitive Status: Within Functional Limits for tasks assessed Arousal/Alertness: Awake/alert Orientation Level: Oriented X4 Sensation Sensation Light Touch: Impaired Detail Light Touch Impaired Details: Impaired RLE;Impaired LLE  (Lower leg/foot,  longstanding hx of neuropathy) Motor  Motor Motor: Abnormal postural alignment and control Motor - Skilled Clinical Observations: L foot drop s/p knee surgery, has AFO  Mobility Bed Mobility Bed Mobility: Supine to Sit;Sit to Supine Supine to Sit: 5: Supervision Supine to Sit Details: Verbal cues for sequencing;Verbal cues for technique Sit to Supine: 5: Supervision Sit to Supine - Details: Verbal cues for technique;Verbal cues for sequencing Transfers Transfers: Yes Stand Pivot Transfers: 4: Min assist;With armrests Stand Pivot Transfer Details: Verbal cues for technique;Verbal cues for safe use of DME/AE Stand Pivot Transfer Details (indicate cue type and reason): mod vc's for safe hand placement Locomotion  Ambulation Ambulation: Yes Ambulation/Gait Assistance: 4: Min assist Ambulation Distance (Feet): 100 Feet Assistive device: Rolling walker Gait Gait: Yes Gait Pattern: Impaired Gait Pattern: Decreased hip/knee flexion - left;Decreased dorsiflexion - left;Decreased hip/knee flexion - right;Trunk flexed;Narrow base of support;Shuffle;Decreased stride length Gait velocity: mildly impaired Stairs / Additional Locomotion Stairs: Yes Stairs Assistance: 4: Min assist Stairs Assistance Details (indicate cue type and reason): no cues required for sequencing Stair Management Technique: Two rails;Forwards Number of Stairs: 5 Height of Stairs: 6 Wheelchair Mobility Wheelchair Mobility: No  Trunk/Postural Assessment  Cervical Assessment Cervical Assessment: Exceptions to [facility] (forward head) Thoracic Assessment Thoracic Assessment: Exceptions to [facility] (kyphotic) Lumbar Assessment Lumbar Assessment: Exceptions to [facility] (forward flexed, unable to extend with verbal/tactile cues) Postural Control Postural Control: Deficits on evaluation Righting Reactions: delayed Protective Responses: delayed  Balance Balance Balance Assessed: Yes Static Standing Balance Static  Standing - Balance Support: During functional activity;No upper extremity supported Static Standing - Level of Assistance: 5: Stand by assistance Dynamic Standing Balance Dynamic Standing - Balance Support: During functional activity;No upper extremity supported Dynamic Standing - Level of Assistance: 4: Min assist Extremity Assessment  RLE Assessment RLE Assessment: Within Functional Limits (grossly 4 to 4+/5 throughout) LLE Assessment LLE Assessment: Exceptions to [facility] LLE Strength LLE Overall Strength: Deficits LLE Overall Strength Comments: Generalized weakness throughout, hx of foot drop with ankle DF 1/5 and PF 2+/5  FIM:  FIM - Control and instrumentation engineer Devices: Walker;Arm rests Bed/Chair Transfer: 5: Sit > Supine: Supervision (verbal cues/safety issues);5: Supine > Sit: Supervision (verbal cues/safety issues);4: Bed > Chair or W/C: Min A (steadying Pt. > 75%);4: Chair or W/C > Bed: Min A (steadying Pt. > 75%) FIM - Locomotion: Wheelchair Locomotion: Wheelchair: 1: Total Assistance/staff pushes wheelchair (Pt<25%) FIM - Locomotion: Ambulation Locomotion: Ambulation Assistive Devices: Administrator Ambulation/Gait Assistance: 4: Min assist Locomotion: Ambulation: 2: Travels 50 - 149 ft with minimal assistance (Pt.>75%) FIM - Locomotion: Stairs Locomotion: Scientist, physiological: Hand rail - 2 Locomotion: Stairs: 2: Up and Down 4 - 11 stairs with minimal assistance (Pt.>75%)   Refer to Care Plan for Long Term Goals  Recommendations for other services: None  Discharge Criteria: Patient will be discharged from PT if patient refuses treatment 3 consecutive times without medical reason, if treatment goals not met, if there is a change in medical status, if patient makes no progress towards goals or if patient is discharged from hospital.  The above assessment, treatment plan, treatment alternatives and goals were discussed and mutually agreed upon: by  patient  Laretta Alstrom 12/30/2013, 10:32 AM

## 2013-12-30 NOTE — Progress Notes (Signed)
Social Work Assessment and Plan Social Work Assessment and Plan  Patient Details  Name: [name] MRN: [id number] Date of Birth: [birth year]  Today's Date: 12/30/2013  Problem List:  Patient Active Problem List   Diagnosis Date Noted  . Acute on chronic combined systolic and diastolic congestive heart failure 12/29/2013  . Physical deconditioning 12/29/2013  . Dehydration 12/22/2013  . Angina at rest 12/21/2013  . HF (heart failure) 12/18/2013  . (HFpEF) heart failure with preserved ejection fraction 12/18/2013  . Solitary pulmonary nodule 10/09/2013  . Acute on chronic diastolic CHF (congestive heart failure), NYHA class 4 09/13/2013  . CKD (chronic kidney disease) stage 4, GFR 15-29 ml/min   . Hilar lymphadenopathy 08/10/2013  . Acute on chronic renal failure 08/05/2013  . Acute on chronic diastolic congestive heart failure, NYHA class 4 08/03/2013  . Encounter for long-term (current) use of other medications 06/25/2013  . Urinary retention, now with foley cath and leg bag. 06/18/2013  . Acute diastolic CHF (congestive heart failure) 06/18/2013  . Chronic anticoagulation 04/03/2013    Class: Chronic  . Pacemaker - St Jude 04/03/2013    Class: Chronic  . Paroxysmal atrial fibrillation, DCCV 06/16/13 08/22/2012    Class: Chronic  . CKD (chronic kidney disease), stage III 08/18/2012  . Hypertension   . Diabetes mellitus    Past Medical History:  Past Medical History  Diagnosis Date  . Diabetes mellitus   . Hyperlipidemia   . Coronary artery disease   . Spinal stenosis   . Pneumonia   . Pacemaker   . Anemia   . Carotid artery disease   . PVD (peripheral vascular disease)   . Hyponatremia     Chronic  . CVA (cerebral infarction)   . CKD (chronic kidney disease) stage 4, GFR 15-29 ml/min   . Long term (current) use of anticoagulants     No bleeding on Eliquis  . Dysrhythmia   . PSVT (paroxysmal supraventricular tachycardia)   . PAF (paroxysmal atrial fibrillation)      Asymptomatic. On Eliquis.  . AV block, 3rd degree     With DDD St. Jude PM, initially placed in 1993 by Dr. Nils Pyle. Device upgrade 10/2002 to DDD, by Dr. Rollene Fare complicated by bleeding.  . Essential hypertension, benign   . CHF (congestive heart failure)   . Chronic diastolic congestive heart failure     ECHO 05/20/12 LVEF estimated by 2D at 55-60%  . Arthritis   . Shortness of breath    Past Surgical History:  Past Surgical History  Procedure Laterality Date  . Lumbar laminectomy  1995  . Back surgery    . Total knee arthroplasty Left   . Quadriceps tendon repair    . Cataract extraction    . Carotid endarterectomy    . Yag laser application Right 2/68/3419    Procedure: YAG LASER CAPSULOTOMY OF RIGHT EYE;  Surgeon: [name], MD;  Location: [place];  Service: Ophthalmology;  Laterality: Right;  . Tonsillectomy    . Insert / replace / remove pacemaker      DDD St. Jude PM, initially placed in 1993 by Dr. Nils Pyle. Device upgrade 10/2002 to DDD, by Dr. Rollene Fare complicated by bleeding.  . Carotid endarterectomy Right 2006  . Transurethral resection of prostate    . Cardioversion N/A 06/16/2013    Procedure: CARDIOVERSION BEDSIDE;  Surgeon: [name], MD;  Location: [facility] OR;  Service: Cardiovascular;  Laterality: N/A;   Social History:  reports  that he quit smoking about 55 years ago. His smoking use included Pipe. He has never used smokeless tobacco. He reports that he does not drink alcohol or use illicit drugs.  Family / Support Systems Marital Status: Widow/Widower Patient Roles: Parent;Other (Comment) (Physican) Children: Smokey Melott   [phone]  [phone] Other Supports: [name] Reid-assistant  [phone]  [phone] Anticipated Caregiver: Son and [name] Ability/Limitations of Caregiver: Both have assisted pt in the past and will do what is needed for him now. Caregiver Availability: 24/7 Family Dynamics: Close knit with his seven  children, his son lives with him and helps when needed.  Pt is one to be as independent as possible and prides himself on this.  His assistant [name] has been with him for years, she helps him with home management and in his office.  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   Abuse/Neglect Physical Abuse: Denies Verbal Abuse: Denies Sexual Abuse: Denies Exploitation of patient/patient's resources: Denies Self-Neglect: Denies  Emotional Status Pt's affect, behavior adn adjustment status: Pt is motivated and pleased to be doing well here in such a short period of time.  He feels he is getting ready to return home.  He prides himself on his independence and ability to recover from all of his health issues.  He plans to get the most out of the time he has left here [place].  Son agrees with Dad and feels he has a done a remarkable job. Recent Psychosocial Issues: Other medical issues- all are managed at this time Pyschiatric History: No history deferred depression screen due to doing well and coping appropriately.  Short length of stay will be home very soon. Substance Abuse History: None  Patient / Family Perceptions, Expectations & Goals Pt/Family understanding of illness & functional limitations: Pt and son can explain his health issues and treatment plan.  Pt discusses with MD daily while on his rounds and feels his concerns are being addressed.  Son is pleased with pt's progress and improvement. 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  He works [employer] at [employer].  He has been on rehab before and aware of the process.  Clinical Impression Very pleasant gentleman who is well known on rehab and in the [facility].  He is doing well and almost back to baseline  functioning.  His assistant and son are here and observing  In therapies.  He and [name] have a routine they follow and will continue this once discharged.  Pt is refreshing to talk to and has numerous interesting stories.  Aware short length of stay.  Elease Hashimoto 12/30/2013, 1:19 PM

## 2013-12-30 NOTE — Evaluation (Signed)
Occupational Therapy Assessment and Plan  Patient Details  Name: CHANEY MACLAREN MRN: [id number] Date of Birth: March 12, 1919  OT Diagnosis: abnormal posture and muscle weakness (generalized) Rehab Potential: Rehab Potential: Good ELOS: 3-5 days   Today's Date: 12/30/2013 OT Individual Time: 0930-1030 OT Individual Time Calculation (min): 60 min    Problem List:  Patient Active Problem List   Diagnosis Date Noted  . Acute on chronic combined systolic and diastolic congestive heart failure 12/29/2013  . Physical deconditioning 12/29/2013  . Dehydration 12/22/2013  . Angina at rest 12/21/2013  . HF (heart failure) 12/18/2013  . (HFpEF) heart failure with preserved ejection fraction 12/18/2013  . Solitary pulmonary nodule 10/09/2013  . Acute on chronic diastolic CHF (congestive heart failure), NYHA class 4 09/13/2013  . CKD (chronic kidney disease) stage 4, GFR 15-29 ml/min   . Hilar lymphadenopathy 08/10/2013  . Acute on chronic renal failure 08/05/2013  . Acute on chronic diastolic congestive heart failure, NYHA class 4 08/03/2013  . Encounter for long-term (current) use of other medications 06/25/2013  . Urinary retention, now with foley cath and leg bag. 06/18/2013  . Acute diastolic CHF (congestive heart failure) 06/18/2013  . Chronic anticoagulation 04/03/2013    Class: Chronic  . Pacemaker - St Jude 04/03/2013    Class: Chronic  . Paroxysmal atrial fibrillation, DCCV 06/16/13 08/22/2012    Class: Chronic  . CKD (chronic kidney disease), stage III 08/18/2012  . Hypertension   . Diabetes mellitus     Past Medical History:  Past Medical History  Diagnosis Date  . Diabetes mellitus   . Hyperlipidemia   . Coronary artery disease   . Spinal stenosis   . Pneumonia   . Pacemaker   . Anemia   . Carotid artery disease   . PVD (peripheral vascular disease)   . Hyponatremia     Chronic  . CVA (cerebral infarction)   . CKD (chronic kidney disease) stage 4, GFR 15-29 ml/min    . Long term (current) use of anticoagulants     No bleeding on Eliquis  . Dysrhythmia   . PSVT (paroxysmal supraventricular tachycardia)   . PAF (paroxysmal atrial fibrillation)     Asymptomatic. On Eliquis.  . AV block, 3rd degree     With DDD St. Jude PM, initially placed in 1993 by Dr. Nils Pyle. Device upgrade 10/2002 to DDD, by Dr. Rollene Fare complicated by bleeding.  . Essential hypertension, benign   . CHF (congestive heart failure)   . Chronic diastolic congestive heart failure     ECHO 05/20/12 LVEF estimated by 2D at 55-60%  . Arthritis   . Shortness of breath    Past Surgical History:  Past Surgical History  Procedure Laterality Date  . Lumbar laminectomy  1995  . Back surgery    . Total knee arthroplasty Left   . Quadriceps tendon repair    . Cataract extraction    . Carotid endarterectomy    . Yag laser application Right 7/48/2707    Procedure: YAG LASER CAPSULOTOMY OF RIGHT EYE;  Surgeon: [name], MD;  Location: [facility];  Service: Ophthalmology;  Laterality: Right;  . Tonsillectomy    . Insert / replace / remove pacemaker      DDD St. Jude PM, initially placed in 1993 by Dr. Nils Pyle. Device upgrade 10/2002 to DDD, by Dr. Rollene Fare complicated by bleeding.  . Carotid endarterectomy Right 2006  . Transurethral resection of prostate    . Cardioversion N/A 06/16/2013  Procedure: CARDIOVERSION BEDSIDE;  Surgeon: [name], MD;  Location: [facility] OR;  Service: Cardiovascular;  Laterality: N/A;    Assessment & Plan Clinical Impression: Patient is a 80 y.o. year old male with recent admission to the hospital on 08/11/125 with generalized weakness, fall and dehydration. He was treated with IVF but developed acute on chronic diastolic HF due to rehydration.  Patient transferred to CIR on 12/29/2013 .    Patient currently requires min with basic self-care skills secondary to muscle weakness and muscle joint tightness and decreased standing balance, decreased  postural control and decreased balance strategies.  Prior to hospitalization, patient could complete ADLs with supervision to min assist.  Patient will benefit from skilled intervention to decrease level of assist with basic self-care skills and increase independence with basic self-care skills prior to discharge home with care partner.  Anticipate patient will require intermittent supervision and no further OT follow recommended.  OT - End of Session Activity Tolerance: Tolerates 30+ min activity with multiple rests Endurance Deficit: Yes OT Assessment Rehab Potential: Good OT Patient demonstrates impairments in the following area(s): Balance;Endurance OT Basic ADL's Functional Problem(s): Grooming;Bathing;Dressing;Toileting OT Transfers Functional Problem(s): Toilet;Tub/Shower OT Additional Impairment(s): None OT Plan OT Intensity: Minimum of 1-2 x/day, 45 to 90 minutes OT Frequency: 5 out of 7 days OT Duration/Estimated Length of Stay: 3-5 days OT Treatment/Interventions: Balance/vestibular training;DME/adaptive equipment instruction;Discharge planning;Patient/family education;UE/LE Strength taining/ROM;UE/LE Coordination activities;Therapeutic Activities;Self Care/advanced ADL retraining;Neuromuscular re-education;Therapeutic Exercise OT Self Feeding Anticipated Outcome(s): independent OT Basic Self-Care Anticipated Outcome(s): min assist level OT Toileting Anticipated Outcome(s): supervision OT Bathroom Transfers Anticipated Outcome(s): supervision OT Recommendation Patient destination: Home Equipment Recommended: Tub/shower bench    OT Evaluation Precautions/Restrictions  Precautions Precautions: Fall Required Braces or Orthoses: Other Brace/Splint;Spinal Brace Spinal Brace: Lumbar corset;Applied in sitting position;Other (comment) Spinal Brace Comments: long standing for comfort grossly 25years Other Brace/Splint: AFO LLE Restrictions Weight Bearing Restrictions:  No  Pain Pain Assessment Pain Assessment: No/denies pain Home Living/Prior Functioning Home Living Family/patient expects to be discharged to:: Private residence Living Arrangements: Other relatives;Children;Other (Comment) (hired caregiver) Available Help at Discharge: Family;Friend(s);Available 24 hours/day;Personal care attendant Type of Home: House Home Access: Stairs to enter [employer] of Steps: 3 Entrance Stairs-Rails: Right;Left;Can reach both Home Layout: One level  Lives With: Family IADL History Homemaking Responsibilities: No Occupation: Part time employment Type of Occupation: Pt still working part-time as MD in Biomedical engineer. 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) Leisure: Hobbies-yes (Comment) Comments: used to golf, plays computer games, uses computer ADL See FIM scale for details  Vision/Perception  Vision- History Baseline Vision/History: Wears glasses Wears Glasses: At all times Patient Visual Report: No change from baseline Vision- Assessment Vision Assessment?: No apparent visual deficits  Cognition Overall Cognitive Status: Within Functional Limits for tasks assessed Arousal/Alertness: Awake/alert Orientation Level: Oriented X4 Attention: Sustained Sustained Attention: Appears intact Memory: Appears intact Awareness: Appears intact Problem Solving: Appears intact Safety/Judgment: Appears intact Sensation Sensation Light Touch: Impaired Detail Light Touch Impaired Details: Impaired RUE;Impaired LUE Stereognosis: Appears Intact Hot/Cold: Appears Intact Proprioception: Appears Intact Additional Comments: Pt reports occasional numbness in the end of his digits. Coordination Gross Motor Movements are Fluid and Coordinated:  Yes Fine Motor Movements are Fluid and Coordinated: Yes Motor  Motor Motor: Abnormal postural alignment and control Motor - Skilled Clinical Observations: Pt maintains flexed trunk  in standing,  also with left foot drop as well. Mobility  Bed Mobility Bed Mobility: Supine to Sit;Sit to Supine Supine to Sit: 5: Supervision Supine to Sit Details: Verbal cues for sequencing;Verbal cues for technique Sit to Supine: 5: Supervision Sit to Supine - Details: Verbal cues for technique;Verbal cues for sequencing Transfers Transfers: Stand to Sit;Sit to Stand Sit to Stand: 4: Min guard;From chair/3-in-1;With upper extremity assist Sit to Stand Details: Verbal cues for technique Stand to Sit: 4: Min guard;To chair/3-in-1;With armrests Stand to Sit Details (indicate cue type and reason): Verbal cues for technique Stand to Sit Details: Pt needed min instructional cueing for hand placement to reach back to the chair and sit down easily.   Trunk/Postural Assessment  Cervical Assessment Cervical Assessment: Exceptions to [facility] Thoracic Assessment Thoracic Assessment: Exceptions to [facility] Thoracic Strength Overall Thoracic Strength Comments: Pt with flexed trunk in standing. Lumbar Assessment Lumbar Assessment: Exceptions to [facility] Lumbar Strength Overall Lumbar Strength Comments: Pt unable to achieve upright standing maintains lumbar flexion Postural Control Postural Control: Deficits on evaluation Righting Reactions: delayed Protective Responses: delayed  Balance Balance Balance Assessed: Yes Static Standing Balance Static Standing - Balance Support: No upper extremity supported Static Standing - Level of Assistance: 5: Stand by assistance Dynamic Standing Balance Dynamic Standing - Balance Support: Right upper extremity supported;Left upper extremity supported Dynamic Standing - Level of Assistance: 4: Min assist Dynamic Standing - Balance Activities: Reaching for objects;Other (comment) (During  bathing and dressing tasks) Extremity/Trunk Assessment RUE Assessment RUE Assessment: Within Functional Limits LUE Assessment LUE Assessment: Exceptions to [facility] LUE Strength LUE Overall Strength Comments: Pt with limited AROM shoulder flexion 0-80 degrees secondary to history of rotator cuff injury.  All other joints AROM WFLs and strength also WFLs  FIM:  FIM - Eating Eating Activity: 7: Complete independence:no helper FIM - Grooming Grooming Steps: Wash, rinse, dry face;Wash, rinse, dry hands;Oral care, brush teeth, clean dentures;Brush, comb hair;Shave or apply make-up Grooming: 5: Set-up assist to obtain items FIM - Bathing Bathing Steps Patient Completed: Chest;Right Arm;Left Arm;Abdomen;Front perineal area;Buttocks;Right upper leg;Left upper leg Bathing: 4: Min-Patient completes 8-9 [id number] parts or 75+ percent FIM - Upper Body Dressing/Undressing Upper body dressing/undressing steps patient completed: Thread/unthread right sleeve of pullover shirt/dresss;Thread/unthread left sleeve of pullover shirt/dress;Put head through opening of pull over shirt/dress;Pull shirt over trunk FIM - Lower Body Dressing/Undressing Lower body dressing/undressing steps patient completed: Thread/unthread right underwear leg;Thread/unthread left underwear leg;Pull underwear up/down;Thread/unthread right pants leg;Thread/unthread left pants leg;Pull pants up/down Lower body dressing/undressing: 3: Mod-Patient completed 50-74% of tasks FIM - BControl and instrumentation engineerDevices: Walker;Arm rests Bed/Chair Transfer: 5: Sit > Supine: Supervision (verbal cues/safety issues);5: Supine > Sit: Supervision (verbal cues/safety issues);4: Bed > Chair or W/C: Min A (steadying Pt. > 75%);4: Chair or W/C > Bed: Min A (steadying Pt. > 75%)   Refer to Care Plan for Long Term Goals  Recommendations for other services: None  Discharge Criteria: Patient will be discharged from OT if patient refuses  treatment 3 consecutive times without medical reason, if treatment goals not met, if there is a change in medical status, if patient makes no progress towards goals or if patient is discharged from hospital.  The above assessment, treatment plan, treatment alternatives and goals were discussed and mutually agreed upon: by patient  During session began work on selfcare re-training with use of DME and focus on sit to stand and standing balance as well as flexibility during LB selfcare tasks.  Pt  has hired caregiver that usually assists him with selfcare tasks daily as needed.    Song Garris OTR/L 12/30/2013, 12:43 PM

## 2013-12-30 NOTE — Care Management Note (Signed)
[facility] Individual Statement of Services  Patient Name:  [name]  Date:  12/30/2013  Welcome to the [facility].  Our goal is to provide you with an individualized program based on your diagnosis and situation, designed to meet your specific needs.  With this comprehensive rehabilitation program, you will be expected to participate in at least 3 hours of rehabilitation therapies Monday-Friday, with modified therapy programming on the weekends.  Your rehabilitation program will include the following services:  Physical Therapy (PT), Occupational Therapy (OT), 24 hour per day rehabilitation nursing, Case Management (Social Worker), Rehabilitation Medicine, Nutrition Services and Pharmacy Services  Weekly team conferences will be held on Wednesday to discuss your progress.  Your Social Worker will talk with you frequently to get your input and to update you on team discussions.  Team conferences with you and your family in attendance may also be held.  Expected length of stay: 5 days Overall anticipated outcome: supervision/mod/i level  Depending on your progress and recovery, your program may change. Your Social Worker will coordinate services and will keep you informed of any changes. Your Social Worker's name and contact numbers are listed  below.  The following services may also be recommended but are not provided by the [facility] will be made to provide these services after discharge if needed.  Arrangements include referral to agencies that provide these services.  Your insurance has been verified to be:  Medicare & [place] Your primary doctor is:  Jeanann Lewandowsky  Pertinent information will be shared with your doctor and your insurance company.  Social Worker:  Ovidio Kin, [place] or ([phone]  Information discussed with and copy given to patient by: Elease Hashimoto, 12/30/2013, 1:01 PM

## 2013-12-30 NOTE — Progress Notes (Signed)
Social Work Patient ID: [name], male   DOB: December 25, 1918, 80 y.o.   MRN: [id number] Met with pt and son to inform of team conference goals-supervision/mod/i level and discharge 8/22.  Both are pleased and feel he will be ready to go home then. To try rollator rolling walker from home to make sure he is safe with, since he uses this at home.  Work towards discharge Sat.  Son to be here to observe in therapies this Afternoon.

## 2013-12-30 NOTE — Progress Notes (Signed)
80 y.o. male with history of DM, CAD, severe pulmonary HTN, CKD, A fib, recent admission for increase in dyspnea and angina 08/7-08/10/15. He was readmitted on 08/11/125 with generalized weakness, fall and dehydration. He was treated with IVF but developed acute on chronic diastolic HF due to rehydration. Foley placed due to recurrent urinary retention  Subjective/Complaints:   Objective: Vital Signs: Blood pressure 131/72, pulse 66, temperature 98.2 F (36.8 C), temperature source Oral, resp. rate 18, weight 85 kg (187 lb 6.3 oz), SpO2 97.00%. No results found. Results for orders placed during the hospital encounter of 12/29/13 (from the past 72 hour(s))  GLUCOSE, CAPILLARY     Status: Abnormal   Collection Time    12/29/13  4:49 PM      Result Value Ref Range   Glucose-Capillary 262 (*) 70 - 99 mg/dL  URINALYSIS, ROUTINE W REFLEX MICROSCOPIC     Status: None   Collection Time    12/29/13  9:00 PM      Result Value Ref Range   Color, Urine YELLOW  YELLOW   APPearance CLEAR  CLEAR   Specific Gravity, Urine 1.012  1.005 - 1.030   pH 6.5  5.0 - 8.0   Glucose, UA NEGATIVE  NEGATIVE mg/dL   Hgb urine dipstick NEGATIVE  NEGATIVE   Bilirubin Urine NEGATIVE  NEGATIVE   Ketones, ur NEGATIVE  NEGATIVE mg/dL   Protein, ur NEGATIVE  NEGATIVE mg/dL   Urobilinogen, UA 1.0  0.0 - 1.0 mg/dL   Nitrite NEGATIVE  NEGATIVE   Leukocytes, UA NEGATIVE  NEGATIVE   Comment: MICROSCOPIC NOT DONE ON URINES WITH NEGATIVE PROTEIN, BLOOD, LEUKOCYTES, NITRITE, OR GLUCOSE <1000 mg/dL.  GLUCOSE, CAPILLARY     Status: Abnormal   Collection Time    12/29/13  9:36 PM      Result Value Ref Range   Glucose-Capillary 216 (*) 70 - 99 mg/dL  CBC WITH DIFFERENTIAL     Status: Abnormal   Collection Time    12/30/13  6:27 AM      Result Value Ref Range   WBC 6.1  4.0 - 10.5 K/uL   RBC 3.42 (*) 4.22 - 5.81 MIL/uL   Hemoglobin 11.1 (*) 13.0 - 17.0 g/dL   HCT 33.2 (*) 39.0 - 52.0 %   MCV 97.1  78.0 - 100.0 fL   MCH  32.5  26.0 - 34.0 pg   MCHC 33.4  30.0 - 36.0 g/dL   RDW 14.9  11.5 - 15.5 %   Platelets 219  150 - 400 K/uL   Neutrophils Relative % 60  43 - 77 %   Neutro Abs 3.7  1.7 - 7.7 K/uL   Lymphocytes Relative 28  12 - 46 %   Lymphs Abs 1.7  0.7 - 4.0 K/uL   Monocytes Relative 9  3 - 12 %   Monocytes Absolute 0.5  0.1 - 1.0 K/uL   Eosinophils Relative 3  0 - 5 %   Eosinophils Absolute 0.2  0.0 - 0.7 K/uL   Basophils Relative 0  0 - 1 %   Basophils Absolute 0.0  0.0 - 0.1 K/uL  COMPREHENSIVE METABOLIC PANEL     Status: Abnormal   Collection Time    12/30/13  6:27 AM      Result Value Ref Range   Sodium 141  137 - 147 mEq/L   Potassium 3.6 (*) 3.7 - 5.3 mEq/L   Chloride 97  96 - 112 mEq/L   CO2 32  19 - 32 mEq/L   Glucose, Bld 116 (*) 70 - 99 mg/dL   BUN 35 (*) 6 - 23 mg/dL   Creatinine, Ser 2.30 (*) 0.50 - 1.35 mg/dL   Calcium 9.2  8.4 - 10.5 mg/dL   Total Protein 6.4  6.0 - 8.3 g/dL   Albumin 2.9 (*) 3.5 - 5.2 g/dL   AST 48 (*) 0 - 37 U/L   ALT 58 (*) 0 - 53 U/L   Alkaline Phosphatase 55  39 - 117 U/L   Total Bilirubin 0.3  0.3 - 1.2 mg/dL   GFR calc non Af Amer 23 (*) >90 mL/min   GFR calc Af Amer 27 (*) >90 mL/min   Comment: (NOTE)     The eGFR has been calculated using the CKD EPI equation.     This calculation has not been validated in all clinical situations.     eGFR's persistently <90 mL/min signify possible Chronic Kidney     Disease.   Anion gap 12  5 - 15  GLUCOSE, CAPILLARY     Status: Abnormal   Collection Time    12/30/13  6:48 AM      Result Value Ref Range   Glucose-Capillary 118 (*) 70 - 99 mg/dL     HEENT: normal Cardio: RRR and no murmur Resp: CTA B/L and unlabored GI: BS positive and NT,ND Extremity:  No Edema Skin:   Intact Neuro: Alert/Oriented, Abnormal Sensory diminished LT Left dorsum foot and Abnormal Motor -3/5 Left ankle DF, 4- R ankle DF Musc/Skel:  Normal GEN NAD   Assessment/Plan: 1. Functional deficits secondary to deconditioning  secondary to multiple medical issues which require 3+ hours per day of interdisciplinary therapy in a comprehensive inpatient rehab setting. Physiatrist is providing close team supervision and 24 hour management of active medical problems listed below. Physiatrist and rehab team continue to assess barriers to discharge/monitor patient progress toward functional and medical goals. FIM:                                  Medical Problem List and Plan:   1. Functional deficits secondary to Deconditioning due to multiple medical issues. (Hx of left foot drop)   2. DVT Prophylaxis/Anticoagulation: Pharmaceutical: Other (comment)--Eliquis   3. Pain Management: N/A   4. Mood: LCSW to follow for evaluation and support. Has good outlook and wants to get back to work.   5. Neuropsych: This patient is capable of making decisions on his own behalf.   6. Skin/Wound Care: Encourage out of be activity. Maintain adequate nutritional and hydration status.   7. CHF: Compensated at current time with improvement of respiratory status. Continue Imdur, metoprolol, hydralazine and Demadex.   8. DM type 2: Will monitor BS with ac/hs checks. Po intake is good. Continue lantus 12 units at bedtime with SSI for elevated BS.   9. A fib: Will monitor heart rate tid. Continue amiodarone and metoprolol for rate control.   10. Urinary retention: resolved. Continue Avodart and Rapflo. Check UA/UCS.   LOS (Days) 1 A FACE TO FACE EVALUATION WAS PERFORMED  [name] 12/30/2013, 7:43 AM

## 2013-12-30 NOTE — Progress Notes (Signed)
Inpatient Diabetes Program Recommendations  AACE/ADA: New Consensus Statement on Inpatient Glycemic Control (2013)  Target Ranges:  Prepandial:   less than 140 mg/dL      Peak postprandial:   less than 180 mg/dL (1-2 hours)      Critically ill patients:  140 - 180 mg/dL   Reason for Visit: Hyperglycemia  Diabetes history: DM2 Outpatient Diabetes medications: Lantus 12 units QHS, Novolog 2-9 units tid with meals Current orders for Inpatient glycemic control: Lantus 12 units QHS, Novolog moderate tidwc  Results for ORLA, JOLLIFF (MRN [id number]) as of 12/30/2013 10:47  Ref. Range 12/29/2013 10:53 12/29/2013 14:59 12/29/2013 16:49 12/29/2013 21:36 12/30/2013 06:48  Glucose-Capillary Latest Range: 70-99 mg/dL 243 (H) 314 (H) 262 (H) 216 (H) 118 (H)   High post-prandial blood sugars. FBS looks good. PO intake adequate.  Recommendations: Add Novolog 3 units tidwc for meal coverage insulin if pt eats >50% meal.  Will continue to follow. Thank you. Lorenda Peck, RD, LDN, CDE Inpatient Diabetes Coordinator [phone]

## 2013-12-31 ENCOUNTER — Inpatient Hospital Stay (HOSPITAL_COMMUNITY): Payer: Medicare Other | Admitting: Physical Therapy

## 2013-12-31 ENCOUNTER — Encounter (HOSPITAL_COMMUNITY): Payer: Medicare Other | Admitting: Occupational Therapy

## 2013-12-31 ENCOUNTER — Inpatient Hospital Stay (HOSPITAL_COMMUNITY): Payer: Medicare Other | Admitting: Occupational Therapy

## 2013-12-31 DIAGNOSIS — R5381 Other malaise: Secondary | ICD-10-CM

## 2013-12-31 DIAGNOSIS — Z5189 Encounter for other specified aftercare: Secondary | ICD-10-CM

## 2013-12-31 DIAGNOSIS — N189 Chronic kidney disease, unspecified: Secondary | ICD-10-CM

## 2013-12-31 DIAGNOSIS — N179 Acute kidney failure, unspecified: Secondary | ICD-10-CM

## 2013-12-31 DIAGNOSIS — I4891 Unspecified atrial fibrillation: Secondary | ICD-10-CM

## 2013-12-31 LAB — URINE CULTURE
CULTURE: NO GROWTH
Colony Count: NO GROWTH

## 2013-12-31 LAB — GLUCOSE, CAPILLARY
GLUCOSE-CAPILLARY: 172 mg/dL — AB (ref 70–99)
Glucose-Capillary: 144 mg/dL — ABNORMAL HIGH (ref 70–99)
Glucose-Capillary: 217 mg/dL — ABNORMAL HIGH (ref 70–99)
Glucose-Capillary: 119 mg/dL — ABNORMAL HIGH (ref 70–99)

## 2013-12-31 MED ORDER — AMIODARONE HCL 200 MG PO TABS
200.0000 mg | ORAL_TABLET | Freq: Every day | ORAL | Status: DC
  Administered 2014-01-01: 200 mg via ORAL
  Filled 2013-12-31 (×2): qty 1

## 2013-12-31 NOTE — Progress Notes (Signed)
Physical Therapy Session Note  Patient Details  Name: [name] MRN: [id number] Date of Birth: 1918-08-29  Today's Date: 12/31/2013 PT Individual Time: 2119-4174  PT Individual Time Calculation (min): 60 min   Short Term Goals: Week 1:  PT Short Term Goal 1 (Week 1): = LTGs due to anticipated LOS  Skilled Therapeutic Interventions/Progress Updates:   Pt received sitting in recliner, agreeable to therapy. Gait training using rollator 2 x 150 ft, min guard progressed to supervision. Berg Balance Scale administered with score of 10/56 (see below for details), indicating patient is at very high risk of falls. Pt educated on results of test, verbalized understanding. With use of rollator for single UE support, pt maintains dynamic standing balance with game of horseshoes at overall supervision level and min A to pick up 2-3 horseshoes from floor. Pt requires frequent seated rest breaks due to decreased endurance and SOB. Pt performed car transfer using rollator with supervision, no cues needed for technique. Pt returned to room with rollator and supervision and left sitting in recliner with LEs elevated and all needs within reach.   Therapy Documentation Precautions:  Precautions Precautions: Fall Required Braces or Orthoses: Other Brace/Splint;Spinal Brace Spinal Brace: Lumbar corset;Applied in sitting position;Other (comment) Spinal Brace Comments: long standing for comfort grossly 25years Other Brace/Splint: AFO LLE Restrictions Weight Bearing Restrictions: No Vital Signs: Therapy Vitals Temp: 97.9 F (36.6 C) Temp src: Oral Pulse Rate: 64 Resp: 18 BP: 126/52 mmHg Patient Position (if appropriate): Lying Oxygen Therapy SpO2: 96 % O2 Device: None (Room air) Pain: Pain Assessment Pain Assessment: No/denies pain Locomotion : Ambulation Ambulation/Gait Assistance: 5: Supervision;4: Min guard  Balance: Balance Balance Assessed: Yes Standardized Balance  Assessment Standardized Balance Assessment: Berg Balance Test Berg Balance Test Sit to Stand: Able to stand using hands after several tries Standing Unsupported: Needs several tries to stand 30 seconds unsupported Sitting with Back Unsupported but Feet Supported on Floor or Stool: Able to sit safely and securely 2 minutes Stand to Sit: Sits independently, has uncontrolled descent Transfers: Able to transfer with verbal cueing and /or supervision Standing Unsupported with Eyes Closed: Needs help to keep from falling Standing Ubsupported with Feet Together: Needs help to attain position and unable to hold for 15 seconds From Standing, Reach Forward with Outstretched Arm: Loses balance while trying/requires external support From Standing Position, Pick up Object from Floor: Unable to try/needs assist to keep balance From Standing Position, Turn to Look Behind Over each Shoulder: Needs assist to keep from losing balance and falling Turn 360 Degrees: Needs assistance while turning Standing Unsupported, Alternately Place Feet on Step/Stool: Needs assistance to keep from falling or unable to try Standing Unsupported, One Foot in Front: Loses balance while stepping or standing Standing on One Leg: Unable to try or needs assist to prevent fall Total Score: 10/56  See FIM for current functional status  Therapy/Group: Individual Therapy  Laretta Alstrom 12/31/2013, 10:51 AM

## 2013-12-31 NOTE — Progress Notes (Signed)
80 y.o. male with history of DM, CAD, severe pulmonary HTN, CKD, A fib, recent admission for increase in dyspnea and angina 08/7-08/10/15. He was readmitted on 08/11/125 with generalized weakness, fall and dehydration. He was treated with IVF but developed acute on chronic diastolic HF due to rehydration. Foley placed due to recurrent urinary retention  Subjective/Complaints: No problems overnite Discussed D/C date  Review of Systems - Negative except leg weakness  Objective: Vital Signs: Blood pressure 126/52, pulse 64, temperature 97.9 F (36.6 C), temperature source Oral, resp. rate 18, weight 84.8 kg (186 lb 15.2 oz), SpO2 96.00%. No results found. Results for orders placed during the hospital encounter of 12/29/13 (from the past 72 hour(s))  GLUCOSE, CAPILLARY     Status: Abnormal   Collection Time    12/29/13  4:49 PM      Result Value Ref Range   Glucose-Capillary 262 (*) 70 - 99 mg/dL  URINE CULTURE     Status: None   Collection Time    12/29/13  9:00 PM      Result Value Ref Range   Specimen Description URINE, CLEAN CATCH     Special Requests NONE     Culture  Setup Time       Value: 12/30/2013 00:46     Performed at [facility]       Value: NO GROWTH     Performed at [facility]   Culture       Value: NO GROWTH     Performed at [facility]   Report Status 12/31/2013 FINAL    URINALYSIS, ROUTINE W REFLEX MICROSCOPIC     Status: None   Collection Time    12/29/13  9:00 PM      Result Value Ref Range   Color, Urine YELLOW  YELLOW   APPearance CLEAR  CLEAR   Specific Gravity, Urine 1.012  1.005 - 1.030   pH 6.5  5.0 - 8.0   Glucose, UA NEGATIVE  NEGATIVE mg/dL   Hgb urine dipstick NEGATIVE  NEGATIVE   Bilirubin Urine NEGATIVE  NEGATIVE   Ketones, ur NEGATIVE  NEGATIVE mg/dL   Protein, ur NEGATIVE  NEGATIVE mg/dL   Urobilinogen, UA 1.0  0.0 - 1.0 mg/dL   Nitrite NEGATIVE  NEGATIVE   Leukocytes, UA NEGATIVE  NEGATIVE   Comment: MICROSCOPIC NOT DONE ON URINES WITH NEGATIVE PROTEIN, BLOOD, LEUKOCYTES, NITRITE, OR GLUCOSE <1000 mg/dL.  GLUCOSE, CAPILLARY     Status: Abnormal   Collection Time    12/29/13  9:36 PM      Result Value Ref Range   Glucose-Capillary 216 (*) 70 - 99 mg/dL  CBC WITH DIFFERENTIAL     Status: Abnormal   Collection Time    12/30/13  6:27 AM      Result Value Ref Range   WBC 6.1  4.0 - 10.5 K/uL   RBC 3.42 (*) 4.22 - 5.81 MIL/uL   Hemoglobin 11.1 (*) 13.0 - 17.0 g/dL   HCT 33.2 (*) 39.0 - 52.0 %   MCV 97.1  78.0 - 100.0 fL   MCH 32.5  26.0 - 34.0 pg   MCHC 33.4  30.0 - 36.0 g/dL   RDW 14.9  11.5 - 15.5 %   Platelets 219  150 - 400 K/uL   Neutrophils Relative % 60  43 - 77 %   Neutro Abs 3.7  1.7 - 7.7 K/uL   Lymphocytes Relative 28  12 - 46 %  Lymphs Abs 1.7  0.7 - 4.0 K/uL   Monocytes Relative 9  3 - 12 %   Monocytes Absolute 0.5  0.1 - 1.0 K/uL   Eosinophils Relative 3  0 - 5 %   Eosinophils Absolute 0.2  0.0 - 0.7 K/uL   Basophils Relative 0  0 - 1 %   Basophils Absolute 0.0  0.0 - 0.1 K/uL  COMPREHENSIVE METABOLIC PANEL     Status: Abnormal   Collection Time    12/30/13  6:27 AM      Result Value Ref Range   Sodium 141  137 - 147 mEq/L   Potassium 3.6 (*) 3.7 - 5.3 mEq/L   Chloride 97  96 - 112 mEq/L   CO2 32  19 - 32 mEq/L   Glucose, Bld 116 (*) 70 - 99 mg/dL   BUN 35 (*) 6 - 23 mg/dL   Creatinine, Ser 2.30 (*) 0.50 - 1.35 mg/dL   Calcium 9.2  8.4 - 10.5 mg/dL   Total Protein 6.4  6.0 - 8.3 g/dL   Albumin 2.9 (*) 3.5 - 5.2 g/dL   AST 48 (*) 0 - 37 U/L   ALT 58 (*) 0 - 53 U/L   Alkaline Phosphatase 55  39 - 117 U/L   Total Bilirubin 0.3  0.3 - 1.2 mg/dL   GFR calc non Af Amer 23 (*) >90 mL/min   GFR calc Af Amer 27 (*) >90 mL/min   Comment: (NOTE)     The eGFR has been calculated using the CKD EPI equation.     This calculation has not been validated in all clinical situations.     eGFR's persistently <90 mL/min signify possible Chronic Kidney      Disease.   Anion gap 12  5 - 15  GLUCOSE, CAPILLARY     Status: Abnormal   Collection Time    12/30/13  6:48 AM      Result Value Ref Range   Glucose-Capillary 118 (*) 70 - 99 mg/dL  GLUCOSE, CAPILLARY     Status: Abnormal   Collection Time    12/30/13 11:27 AM      Result Value Ref Range   Glucose-Capillary 283 (*) 70 - 99 mg/dL  GLUCOSE, CAPILLARY     Status: Abnormal   Collection Time    12/30/13 11:28 AM      Result Value Ref Range   Glucose-Capillary 285 (*) 70 - 99 mg/dL   Comment 1 Notify RN    GLUCOSE, CAPILLARY     Status: Abnormal   Collection Time    12/30/13  4:26 PM      Result Value Ref Range   Glucose-Capillary 178 (*) 70 - 99 mg/dL   Comment 1 Notify RN    GLUCOSE, CAPILLARY     Status: Abnormal   Collection Time    12/30/13  8:59 PM      Result Value Ref Range   Glucose-Capillary 184 (*) 70 - 99 mg/dL     HEENT: normal Cardio: RRR and no murmur Resp: CTA B/L and unlabored GI: BS positive and NT,ND Extremity:  No Edema Skin:   Intact Neuro: Alert/Oriented, Abnormal Sensory diminished LT Left dorsum foot and Abnormal Motor -3/5 Left ankle DF, 4- R ankle DF Musc/Skel:  Normal GEN NAD   Assessment/Plan: 1. Functional deficits secondary to deconditioning secondary to multiple medical issues which require 3+ hours per day of interdisciplinary therapy in a comprehensive inpatient rehab setting. Physiatrist is providing close  team supervision and 24 hour management of active medical problems listed below. Physiatrist and rehab team continue to assess barriers to discharge/monitor patient progress toward functional and medical goals. FIM: FIM - Bathing Bathing Steps Patient Completed: Chest;Right Arm;Left Arm;Abdomen;Front perineal area;Buttocks;Right upper leg;Left upper leg Bathing: 4: Min-Patient completes 8-9 2f10 parts or 75+ percent  FIM - Upper Body Dressing/Undressing Upper body dressing/undressing steps patient completed: Thread/unthread right  sleeve of pullover shirt/dresss;Thread/unthread left sleeve of pullover shirt/dress;Put head through opening of pull over shirt/dress;Pull shirt over trunk FIM - Lower Body Dressing/Undressing Lower body dressing/undressing steps patient completed: Thread/unthread right underwear leg;Thread/unthread left underwear leg;Pull underwear up/down;Thread/unthread right pants leg;Thread/unthread left pants leg;Pull pants up/down Lower body dressing/undressing: 3: Mod-Patient completed 50-74% of tasks     FIM - TAir cabin crewTransfers: 4-To toilet/BSC: Min A (steadying Pt. > 75%);4-From toilet/BSC: Min A (steadying Pt. > 75%)  FIM - BControl and instrumentation engineerDevices: Walker;Arm rests Bed/Chair Transfer: 5: Sit > Supine: Supervision (verbal cues/safety issues);5: Supine > Sit: Supervision (verbal cues/safety issues);4: Bed > Chair or W/C: Min A (steadying Pt. > 75%);4: Chair or W/C > Bed: Min A (steadying Pt. > 75%)  FIM - Locomotion: Wheelchair Locomotion: Wheelchair: 1: Total Assistance/staff pushes wheelchair (Pt<25%) FIM - Locomotion: Ambulation Locomotion: Ambulation Assistive Devices: WAdministratorAmbulation/Gait Assistance: 4: Min assist Locomotion: Ambulation: 2: Travels 50 - 149 ft with minimal assistance (Pt.>75%)  Comprehension Comprehension Mode: Auditory Comprehension: 6-Follows complex conversation/direction: With extra time/assistive device  Expression Expression Mode: Verbal Expression: 6-Expresses complex ideas: With extra time/assistive device  Social Interaction Social Interaction: 7-Interacts appropriately with others - No medications needed.  Problem Solving Problem Solving: 5-Solves basic 90% of the time/requires cueing < 10% of the time  Memory Memory: 6-More than reasonable amt of time  Medical Problem List and Plan:   1. Functional deficits secondary to Deconditioning due to multiple medical issues. (Hx of left foot drop)   2.  DVT Prophylaxis/Anticoagulation: Pharmaceutical: Other (comment)--Eliquis   3. Pain Management: N/A   4. Mood: LCSW to follow for evaluation and support. Has good outlook and wants to get back to work.   5. Neuropsych: This patient is capable of making decisions on his own behalf.   6. Skin/Wound Care: Encourage out of be activity. Maintain adequate nutritional and hydration status.   7. CHF: Compensated at current time with improvement of respiratory status. Continue Imdur, metoprolol, hydralazine and Demadex.   8. DM type 2: Will monitor BS with ac/hs checks. Po intake is good. Continue lantus 12 units at bedtime with SSI for elevated BS.   9. A fib: Will monitor heart rate tid. Continue amiodarone and metoprolol for rate control.   10. Urinary retention: resolved. Continue Avodart and Rapflo. Check UA/UCS. 11.  HypoK, judicious supplementation given CKD, recheck BMET in am  LOS (Days) 2 A FACE TO FEndicottE 12/31/2013, 7:09 AM

## 2013-12-31 NOTE — Progress Notes (Signed)
Recreational Therapy Session Note  Patient Details  Name: [name] MRN: [id number] Date of Birth: 1918/12/21 Today's Date: 12/31/2013  LATE ENTRY from 12/30/2013 Pt & family participated in animal assisted activity/therapy seated with supervision.  Order received & chart reviewed.  Pt scheduled for discharge home on 01/02/14,  No formal TR treatment plan implemented.  [place] 12/31/2013, 8:09 AM

## 2013-12-31 NOTE — Progress Notes (Signed)
Social Work Elease Hashimoto, LCSW Social Worker Signed  Patient Care Conference Service date: 12/30/2013 12:56 PM  Inpatient RehabilitationTeam Conference and Plan of Care Update Date: 12/30/2013   Time: 11;30 AM     Patient Name: [name]       Medical Record Number: [id number]   Date of Birth: [birth year] Sex: Male         Room/Bed: 4M08C/4M08C-01 Payor Info: Payor: MEDICARE / Plan: MEDICARE PART A AND B / Product Type: *No Product type* /   Admitting Diagnosis: Deconditioned   Admit Date/Time:  12/29/2013  4:20 PM Admission Comments: No comment available   Primary Diagnosis:  <principal problem not specified> Principal Problem: <principal problem not specified>    Patient Active Problem List     Diagnosis  Date Noted   .  Acute on chronic combined systolic and diastolic congestive heart failure  12/29/2013   .  Physical deconditioning  12/29/2013   .  Dehydration  12/22/2013   .  Angina at rest  12/21/2013   .  HF (heart failure)  12/18/2013   .  (HFpEF) heart failure with preserved ejection fraction  12/18/2013   .  Solitary pulmonary nodule  10/09/2013   .  Acute on chronic diastolic CHF (congestive heart failure), NYHA class 4  09/13/2013   .  CKD (chronic kidney disease) stage 4, GFR 15-29 ml/min     .  Hilar lymphadenopathy  08/10/2013   .  Acute on chronic renal failure  08/05/2013   .  Acute on chronic diastolic congestive heart failure, NYHA class 4  08/03/2013   .  Encounter for long-term (current) use of other medications  06/25/2013   .  Urinary retention, now with foley cath and leg bag.  06/18/2013   .  Acute diastolic CHF (congestive heart failure)  06/18/2013   .  Chronic anticoagulation  04/03/2013       Class: Chronic   .  Pacemaker - St Jude  04/03/2013       Class: Chronic   .  Paroxysmal atrial fibrillation, DCCV 06/16/13  08/22/2012       Class: Chronic   .  CKD (chronic kidney disease), stage III  08/18/2012   .  Hypertension     .  Diabetes  mellitus       Expected Discharge Date: Expected Discharge Date: 01/02/14  Team Members Present: Physician leading conference: Dr. Alysia Penna Social Worker Present: Ovidio Kin, LCSW Nurse Present: Rayetta Humphrey, RN PT Present: Cameron Sprang, PT;Shaundrea Carrigg Jari Favre, PT OT Present: Willeen Cass, Maryella Shivers, OT SLP Present: Gunnar Fusi, SLP PPS Coordinator present : Ileana Ladd, PT        Current Status/Progress  Goal  Weekly Team Focus   Medical     Diabetes controlled, chronic renal failure  Maintain medical stability to maximize rehabilitation progress  Initiate therapy program, build endurance   Bowel/Bladder     continet b/b LBM 8/18; miralax BID prn  remain contient with regular emptying pattern  assess bowl pattern and adminsiter prn medications as needed   Swallow/Nutrition/ Hydration     [facility]       ADL's     Supervision for UB selfcare, min to mod assist for LB selfcare, min guard for functional transfers and toileting tasks  supervision for transfers and toileting tasks  selfcare re-training, balance re-training,  AE/DME education   Mobility     Min guard for gait with RW up to 100 ft, transfers,  and 5 stairs with 2 rails; supervision bed mobility  mod I to supervision  Activity tolerance, generalized strengthening, balance   Communication     [facility]       Safety/Cognition/ Behavioral Observations    No unsafe behaviors       Pain     no c/o pain; tylenol 650mg  [id number]  pain remain at 0  assess pain periodically and treat is need arises   Skin     monitor skin-no issues at this time         *See Care Plan and progress notes for long and short-term goals.    Barriers to Discharge:  Several chronic medical conditions, advanced age      Possible Resolutions to Barriers:    Continue rehabilitation, educate caregiver      Discharge Planning/Teaching Needs:    Home with son and assistant to assist as was prior to admission.  Doing well and will be very  short length of stay.    Team Discussion:    Doing well on his first day of therapies, will be here short time.  Will try rollator rolling walker uses at home this afternoon, to make sure safe using.  Close to baseline functioning.  Assistant here to observe in therapies.   Revisions to Treatment Plan:    New eval    Continued Need for Acute Rehabilitation Level of Care: The patient requires daily medical management by a physician with specialized training in physical medicine and rehabilitation for the following conditions: Daily direction of a multidisciplinary physical rehabilitation program to ensure safe treatment while eliciting the highest outcome that is of practical value to the patient.: Yes Daily medical management of patient stability for increased activity during participation in an intensive rehabilitation regime.: Yes Daily analysis of laboratory values and/or radiology reports with any subsequent need for medication adjustment of medical intervention for : Other  Elease Hashimoto 12/31/2013, 8:42 AM          Patient ID: [name], male   DOB: [birth year], 80 y.o.   MRN: [id number]

## 2013-12-31 NOTE — Discharge Instructions (Signed)
Heart Failure °Heart failure is a condition in which the heart has trouble pumping blood. This means your heart does not pump blood efficiently for your body to work well. In some cases of heart failure, fluid may back up into your lungs or you may have swelling (edema) in your lower legs. Heart failure is usually a long-term (chronic) condition. It is important for you to take good care of yourself and follow your caregiver's treatment plan. °CAUSES  °Some health conditions can cause heart failure. Those health conditions include: °· High blood pressure (hypertension) causes the heart muscle to work harder than normal. When pressure in the blood vessels is high, the heart needs to pump (contract) with more force in order to circulate blood throughout the body. High blood pressure eventually causes the heart to become stiff and weak. °· Coronary artery disease (CAD) is the buildup of cholesterol and fat (plaque) in the arteries of the heart. The blockage in the arteries deprives the heart muscle of oxygen and blood. This can cause chest pain and may lead to a heart attack. High blood pressure can also contribute to CAD. °· Heart attack (myocardial infarction) occurs when 1 or more arteries in the heart become blocked. The loss of oxygen damages the muscle tissue of the heart. When this happens, part of the heart muscle dies. The injured tissue does not contract as well and weakens the heart's ability to pump blood. °· Abnormal heart valves can cause heart failure when the heart valves do not open and close properly. This makes the heart muscle pump harder to keep the blood flowing. °· Heart muscle disease (cardiomyopathy or myocarditis) is damage to the heart muscle from a variety of causes. These can include drug or alcohol abuse, infections, or unknown reasons. These can increase the risk of heart failure. °· Lung disease makes the heart work harder because the lungs do not work properly. This can cause a strain  on the heart, leading it to fail. °· Diabetes increases the risk of heart failure. High blood sugar contributes to high fat (lipid) levels in the blood. Diabetes can also cause slow damage to tiny blood vessels that carry important nutrients to the heart muscle. When the heart does not get enough oxygen and food, it can cause the heart to become weak and stiff. This leads to a heart that does not contract efficiently. °· Other conditions can contribute to heart failure. These include abnormal heart rhythms, thyroid problems, and low blood counts (anemia). °Certain unhealthy behaviors can increase the risk of heart failure. Those unhealthy behaviors include: °· Being overweight. °· Smoking or chewing tobacco. °· Eating foods high in fat and cholesterol. °· Abusing illicit drugs or alcohol. °· Lacking physical activity. °SYMPTOMS  °Heart failure symptoms may vary and can be hard to detect. Symptoms may include: °· Shortness of breath with activity, such as climbing stairs. °· Persistent cough. °· Swelling of the feet, ankles, legs, or abdomen. °· Unexplained weight gain. °· Difficulty breathing when lying flat (orthopnea). °· Waking from sleep because of the need to sit up and get more air. °· Rapid heartbeat. °· Fatigue and loss of energy. °· Feeling lightheaded, dizzy, or close to fainting. °· Loss of appetite. °· Nausea. °· Increased urination during the night (nocturia). °DIAGNOSIS  °A diagnosis of heart failure is based on your history, symptoms, physical examination, and diagnostic tests. °Diagnostic tests for heart failure may include: °· Echocardiography. °· Electrocardiography. °· Chest X-ray. °· Blood tests. °· Exercise   stress test. °· Cardiac angiography. °· Radionuclide scans. °TREATMENT  °Treatment is aimed at managing the symptoms of heart failure. Medicines, behavioral changes, or surgical intervention may be necessary to treat heart failure. °· Medicines to help treat heart failure may  include: °· Angiotensin-converting enzyme (ACE) inhibitors. This type of medicine blocks the effects of a blood protein called angiotensin-converting enzyme. ACE inhibitors relax (dilate) the blood vessels and help lower blood pressure. °· Angiotensin receptor blockers. This type of medicine blocks the actions of a blood protein called angiotensin. Angiotensin receptor blockers dilate the blood vessels and help lower blood pressure. °· Water pills (diuretics). Diuretics cause the kidneys to remove salt and water from the blood. The extra fluid is removed through urination. This loss of extra fluid lowers the volume of blood the heart pumps. °· Beta blockers. These prevent the heart from beating too fast and improve heart muscle strength. °· Digitalis. This increases the force of the heartbeat. °· Healthy behavior changes include: °· Obtaining and maintaining a healthy weight. °· Stopping smoking or chewing tobacco. °· Eating heart healthy foods. °· Limiting or avoiding alcohol. °· Stopping illicit drug use. °· Physical activity as directed by your caregiver. °· Surgical treatment for heart failure may include: °· A procedure to open blocked arteries, repair damaged heart valves, or remove damaged heart muscle tissue. °· A pacemaker to improve heart muscle function and control certain abnormal heart rhythms. °· An internal cardioverter defibrillator to treat certain serious abnormal heart rhythms. °· A left ventricular assist device to assist the pumping ability of the heart. °HOME CARE INSTRUCTIONS  °· Take your medicine as directed by your caregiver. Medicines are important in reducing the workload of your heart, slowing the progression of heart failure, and improving your symptoms. °· Do not stop taking your medicine unless directed by your caregiver. °· Do not skip any dose of medicine. °· Refill your prescriptions before you run out of medicine. Your medicines are needed every day. °· Take over-the-counter  medicine only as directed by your caregiver or pharmacist. °· Engage in moderate physical activity if directed by your caregiver. Moderate physical activity can benefit some people. The elderly and people with severe heart failure should consult with a caregiver for physical activity recommendations. °· Eat heart healthy foods. Food choices should be free of trans fat and low in saturated fat, cholesterol, and salt (sodium). Healthy choices include fresh or frozen fruits and vegetables, fish, lean meats, legumes, fat-free or low-fat dairy products, and whole grain or high fiber foods. Talk to a dietitian to learn more about heart healthy foods. °· Limit sodium if directed by your caregiver. Sodium restriction may reduce symptoms of heart failure in some people. Talk to a dietitian to learn more about heart healthy seasonings. °· Use healthy cooking methods. Healthy cooking methods include roasting, grilling, broiling, baking, poaching, steaming, or stir-frying. Talk to a dietitian to learn more about healthy cooking methods. °· Limit fluids if directed by your caregiver. Fluid restriction may reduce symptoms of heart failure in some people. °· Weigh yourself every day. Daily weights are important in the early recognition of excess fluid. You should weigh yourself every morning after you urinate and before you eat breakfast. Wear the same amount of clothing each time you weigh yourself. Record your daily weight. Provide your caregiver with your weight record. °· Monitor and record your blood pressure if directed by your caregiver. °· Check your pulse if directed by your caregiver. °· Lose weight if directed   by your caregiver. Weight loss may reduce symptoms of heart failure in some people.  Stop smoking or chewing tobacco. Nicotine makes your heart work harder by causing your blood vessels to constrict. Do not use nicotine gum or patches before talking to your caregiver.  Schedule and attend follow-up visits as  directed by your caregiver. It is important to keep all your appointments.  Limit alcohol intake to no more than 1 drink per day for nonpregnant women and 2 drinks per day for men. Drinking more than that is harmful to your heart. Tell your caregiver if you drink alcohol several times a week. Talk with your caregiver about whether alcohol is safe for you. If your heart has already been damaged by alcohol or you have severe heart failure, drinking alcohol should be stopped completely.  Stop illicit drug use.  Stay up-to-date with immunizations. It is especially important to prevent respiratory infections through current pneumococcal and influenza immunizations.  Manage other health conditions such as hypertension, diabetes, thyroid disease, or abnormal heart rhythms as directed by your caregiver.  Learn to manage stress.  Plan rest periods when fatigued.  Learn strategies to manage high temperatures. If the weather is extremely hot:  Avoid vigorous physical activity.  Use air conditioning or fans or seek a cooler location.  Avoid caffeine and alcohol.  Wear loose-fitting, lightweight, and light-colored clothing.  Learn strategies to manage cold temperatures. If the weather is extremely cold:  Avoid vigorous physical activity.  Layer clothes.  Wear mittens or gloves, a hat, and a scarf when going outside.  Avoid alcohol.  Obtain ongoing education and support as needed.  Participate or seek rehabilitation as needed to maintain or improve independence and quality of life. SEEK MEDICAL CARE IF:   Your weight increases by 03 lb/1.4 kg in 1 day or 05 lb/2.3 kg in a week.  You have increasing shortness of breath that is unusual for you.  You are unable to participate in your usual physical activities.  You tire easily.  You cough more than normal, especially with physical activity.  You have any or more swelling in areas such as your hands, feet, ankles, or abdomen.  You  are unable to sleep because it is hard to breathe.  You feel like your heart is beating fast (palpitations).  You become dizzy or lightheaded upon standing up. SEEK IMMEDIATE MEDICAL CARE IF:   You have difficulty breathing.  There is a change in mental status such as decreased alertness or difficulty with concentration.  You have a pain or discomfort in your chest.  You have an episode of fainting (syncope). MAKE SURE YOU:   Understand these instructions.  Will watch your condition.  Will get help right away if you are not doing well or get worse. Document Released: 04/30/2005 Document Revised: 08/25/2012 Document Reviewed: 05/22/2012 [facility] Patient Information 2014 [place], [place].  Anticoagulation, Generic Anticoagulants are medicines used to prevent clots from developing in your veins. These medicine are also known as blood thinners. If blood clots are untreated, they could travel to your lungs. This is called a pulmonary embolus. A blood clot in your lungs can be fatal.  Health care providers often use anticoagulants to prevent clots following surgery. Anticoagulants are also used along with aspirin when the heart is not getting enough blood. Another anticoagulant called warfarin is started 2 to 3 days after a rapid-acting injectable anticoagulant is started. The rapid-acting anticoagulants are usually continued until warfarin has begun to work. Your health  care provider will judge this length of time by blood tests known as the prothrombin time (PT) and International Normalization Ratio (INR). This means that your blood is at the necessary and best level to prevent clots. RISKS AND COMPLICATIONS  If you have received recent epidural anesthesia, spinal anesthesia, or a spinal tap while receiving anticoagulants, you are at risk for developing a blood clot in or around the spine. This condition could result in long-term or permanent paralysis.  Because anticoagulants thin your  blood, severe bleeding may occur from any tissue or organ. Symptoms of the blood being too thin may include:  Bleeding from the nose or gums that does not stop quickly.  Blood in bowel movements which may appear as bright red, dark, or black tarry stools.  Blood in the urine which may appear as pink, red, or brown urine.  Unusual bruising or bruising easily.  A cut that does not stop bleeding within 10 minutes.  Vomiting blood or continuous nausea for more than 1 day.  Coughing up blood.  Broken blood vessels in your eye (subconjunctival hemorrhage).  Abdominal or back pain with or without flank bruising.  Sudden, severe headache.  Sudden weakness or numbness of the face, arm, or leg, especially on one side of the body.  Sudden confusion.  Trouble speaking (aphasia) or understanding.  Sudden trouble seeing in one or both eyes.  Sudden trouble walking.  Dizziness.  Loss of balance or coordination.  Vaginal bleeding.  Swelling or pain at an injection site.  Superficial fat tissue death (necrosis) which may cause skin scarring. This is more common in women and may first present as pain in the waist, thighs, or buttocks.  Fever.  Too little anticoagulation continues to allow the risk for blood clots. HOME CARE INSTRUCTIONS   Due to the complications of anticoagulants, it is very important that you take your anticoagulant as directed by your health care provider. Anticoagulants need to be taken exactly as instructed. Be sure you understand all your anticoagulant instructions.  Keep all follow-up appointments with your health care provider as directed. It is very important to keep your appointments. Not keeping appointments could result in a chronic or permanent injury, pain, or disability.  Warfarin. Your health care provider will advise you on the length of treatment (usually 3-6 months, sometimes lifelong).  Take warfarin exactly as directed by your health care  provider. It is recommended that you take your warfarin dose at the same time of the day. It is preferred that you take warfarin in the late afternoon. If you have been told to stop taking warfarin, do not resume taking warfarin until directed to do so by your health care provider. Follow your health care provider's instructions if you accidentally take an extra dose or miss a dose of warfarin. It is very important to take warfarin as directed since bleeding or blood clots could result in chronic or permanent injury, pain, or disability.  Too much and too little warfarin are both dangerous. Too much warfarin increases the risk of bleeding. Too little warfarin continues to allow the risk for blood clots. While taking warfarin, you will need to have regular blood tests to measure your blood clotting time. These blood tests usually include both the prothrombin time (PT) and International Normalized Ratio (INR) tests. The PT and INR results allow your health care provider to adjust your dose of warfarin. The dose can change for many reasons. It is critically important that you have your PT  and INR levels drawn exactly as directed. Your warfarin dose may stay the same or change depending on what the PT and INR results are. Be sure to follow up with your health care provider regarding your PT and INR test results and what your warfarin dosage should be.  Many medicines can interfere with warfarin and affect the PT and INR results. You must tell your health care provider about any and all medicines you take, this includes all vitamins and supplements. Ask your health care provider before taking these. Prescription and over-the-counter medicine consistency is critical to warfarin management. It is important that potential interactions are checked before you start a new medicine. Be especially cautious with aspirin and anti-inflammatory medicines. Ask your health care provider before taking these. Medicines such as  antibiotics and acid-reducing medicine can interact with warfarin and can cause an increased warfarin effect. Warfarin can also interfere with the effectiveness of medicines you are taking. Do not take or discontinue any prescribed or over-the-counter medicine except on the advice of your health care provider or pharmacist.  Some vitamins, supplements, and herbal products interfere with the effectiveness of warfarin. Vitamin E may increase the anticoagulant effects of warfarin. Vitamin K may can cause warfarin to be less effective. Do not take or discontinue any vitamin, supplement, or herbal product except on the advice of your health care provider or pharmacist.  Eat what you normally eat and keep the vitamin K content of your diet consistent. Avoid major changes in your diet, or notify your health care provider before changing your diet. Suddenly getting a lot more vitamin K could cause your blood to clot too quickly. A sudden decrease in vitamin K intake could cause your blood to clot too slowly. These changes in vitamin K intake could lead to dangerous blood clotsor to bleeding. To keep your vitamin K intake consistent, you must be aware of which foods contain moderate or high amounts of vitamin K. Some foods high in vitamin K include spinach, kale, broccoli, cabbage, greens, Brussels sprouts, asparagus, [place]oy, coleslaw, parsley, and green tea. Arrange a visit with a dietitian to answer your questions.  If you have a loss of appetite or get the stomach flu (viral gastroenteritis), talk to your health care provider as soon as possible. A decrease in your normal vitamin K intake can make you more sensitive to your usual dose of warfarin.  Some medical conditions may increase your risk for bleeding while you are taking warfarin. A fever, diarrhea lasting more than a day, worsening heart failure, or worsening liver function are some medical conditions that could affect warfarin. Contact your health care  provider if you have any of these medical conditions.  Alcohol can change the body's ability to handle warfarin. It is best to avoid alcoholic drinks or consume only very small amounts while taking warfarin. Notify your health care provider if you change your alcohol intake. A sudden increase in alcohol use can increase your risk of bleeding. Chronic alcohol use can cause warfarin to be less effective.  Be careful not to cut yourself when using sharp objects or while shaving.  Inform all your health care providers and your dentist that you take an anticoagulant.  Limit physical activities or sports that could result in a fall or cause injury. Avoid contact sports.  Wear medical alert jewelry or carry a medical alert card. SEEK IMMEDIATE MEDICAL CARE IF:  You cough up blood.  You have dark or black stools or there is  bright red blood coming from your rectum.  You vomit blood or have nausea for more than 1 day.  You have blood in the urine or pink colored urine.  You have unusual bruising or have increased bruising.  You have bleeding from the nose or gums that does not stop quickly.  You have a cut that does not stop bleeding within a 2-3 minutes.  You have sudden weakness or numbness of the face, arm, or leg, especially on one side of the body.  You have sudden confusion.  You have trouble speaking (aphasia) or understanding.  You have sudden trouble seeing in one or both eyes.  You have sudden trouble walking.  You have dizziness.  You have a loss of balance or coordination.  You have a sudden, severe headache.  You have a serious fall or head injury, even if you are not bleeding.  You have swelling or pain at an injection site.  You have unexplained tenderness or pain in the abdomen, back, waist, thighs or buttocks.  You have a fever. Any of these symptoms may represent a serious problem that is an emergency. Do not wait to see if the symptoms will go away. Get  medical help right away. Call your local emergency services (911 in [place] Do not drive yourself to the hospital. Document Released: 04/30/2005 Document Revised: 05/05/2013 Document Reviewed: 12/03/2007 [facility] Patient Information 2015 [place], [place]. This information is not intended to replace advice given to you by your health care provider. Make sure you discuss any questions you have with your health care provider.

## 2013-12-31 NOTE — Progress Notes (Signed)
Occupational Therapy Session Note  Patient Details  Name: [name] MRN: [id number] Date of Birth: August 27, 1918  Today's Date: 12/31/2013 OT Individual Time: 0800-0900 OT Individual Time Calculation (min): 60 min   Short Term Goals: Week 1:  OT Short Term Goal 1 (Week 1): LTGs equal to supervision level seconary to ELOS.  Skilled Therapeutic Interventions/Progress Updates:    Pt performed bathing, dressing, and grooming sit to stand during session.  Pt began by ambulating over to the sink and sitting in the wheelchair to clean his teeth and perform his shaving with setup only.  Next he ambulated to the shower for bathing with use of the RW and supervision.  Pt utilized grab bar in the shower for sit to stand and for safety.  He needed min questioning cueing to remember to wash his peri area in standing.  Therapist assisted with washing and drying his feet as this is what he has the caregiver help him with at home and they are not interested in changing this.  He ambulated back out to the wheelchair at the sink for dressing.  Min instructional cueing to push up from the arms of the chair for sit to stand and to reach back when sitting.  Therapist assisted with donning TEDs, velcro shoes, and AFO on the LLE.   Therapy Documentation Precautions:  Precautions Precautions: Fall Required Braces or Orthoses: Other Brace/Splint;Spinal Brace Spinal Brace: Lumbar corset;Applied in sitting position;Other (comment) Spinal Brace Comments: long standing for comfort grossly 25years Other Brace/Splint: AFO LLE Restrictions Weight Bearing Restrictions: No  Vital Signs: Therapy Vitals Temp: 97.9 F (36.6 C) Temp src: Oral Pulse Rate: 64 Resp: 18 BP: 126/52 mmHg Patient Position (if appropriate): Lying Oxygen Therapy SpO2: 96 % O2 Device: None (Room air) Pain: Pain Assessment Pain Assessment: No/denies pain ADL:  See FIM for current functional status  Therapy/Group: Individual  Therapy  Tahra Hitzeman OTR/L 12/31/2013, 10:07 AM

## 2013-12-31 NOTE — Progress Notes (Signed)
Patient information reviewed and entered into [facility] by Becky Kylie Simmonds, covering PPS coordinator. Information including medical coding and functional independence measure will be reviewed and updated through discharge.  Per nursing, patient was given "Data Collection Information Summary for Patients in Inpatient Rehabilitation Facilities with attached Privacy Act Statement Health Care Records" upon admission.     

## 2013-12-31 NOTE — Progress Notes (Signed)
Occupational Therapy Session Note  Patient Details  Name: [name] MRN: [id number] Date of Birth: 1918/06/15  Today's Date: 12/31/2013 OT Individual Time: 1300-1400 OT Individual Time Calculation (min): 60 min   Short Term Goals: Week 1:  OT Short Term Goal 1 (Week 1): LTGs equal to supervision level seconary to ELOS.  Skilled Therapeutic Interventions/Progress Updates:    Pt ambulated down to the therapy gym with use of the rollator and close supervision.  He continues to demonstrate increased trunk flexion in standing and with mobility but not LOB noted.  Once in the gym had pt work on sit to stand transitions and standing balance while engaged in [place] activity.  Pt still demonstrates decreased forward weightshift in standing resulting in increased weight over the heels of his feet.  Max assist needed when attempting to stand unsupported while engaged in the activity.  After standing for 2 mins pt's knees would start to buckle at times resulting in the need to sit.  Pt unable to achieve full hip extension in standing or achieve forward weightshift on the balls of his feet.  Pt also continues to need mod instructional cueing for hand placement when attempting to stand or return to sitting.  Ambulated back to the room at the conclusion of the session without the need for a rest break.   Therapy Documentation Precautions:  Precautions Precautions: Fall Required Braces or Orthoses: Other Brace/Splint;Spinal Brace Spinal Brace: Lumbar corset;Applied in sitting position;Other (comment) Spinal Brace Comments: long standing for comfort grossly 25years Other Brace/Splint: AFO LLE Restrictions Weight Bearing Restrictions: No  Pain: Pain Assessment Pain Assessment: No/denies pain ADL: See FIM for current functional status  Therapy/Group: Individual Therapy  Paityn Balsam OTR/L 12/31/2013, 3:05 PM

## 2014-01-01 ENCOUNTER — Encounter (HOSPITAL_COMMUNITY): Payer: Medicare Other | Admitting: Occupational Therapy

## 2014-01-01 ENCOUNTER — Inpatient Hospital Stay (HOSPITAL_COMMUNITY): Payer: Medicare Other | Admitting: Physical Therapy

## 2014-01-01 ENCOUNTER — Inpatient Hospital Stay (HOSPITAL_COMMUNITY): Payer: Medicare Other | Admitting: Occupational Therapy

## 2014-01-01 LAB — BASIC METABOLIC PANEL
ANION GAP: 11 (ref 5–15)
BUN: 40 mg/dL — ABNORMAL HIGH (ref 6–23)
CHLORIDE: 95 meq/L — AB (ref 96–112)
CO2: 33 meq/L — AB (ref 19–32)
CREATININE: 2.65 mg/dL — AB (ref 0.50–1.35)
Calcium: 9.5 mg/dL (ref 8.4–10.5)
GFR calc Af Amer: 22 mL/min — ABNORMAL LOW (ref >90)
GFR calc non Af Amer: 19 mL/min — ABNORMAL LOW (ref >90)
Glucose, Bld: 285 mg/dL — ABNORMAL HIGH (ref 70–99)
Potassium: 4.4 mEq/L (ref 3.7–5.3)
Sodium: 139 mEq/L (ref 137–147)

## 2014-01-01 LAB — GLUCOSE, CAPILLARY
GLUCOSE-CAPILLARY: 132 mg/dL — AB (ref 70–99)
GLUCOSE-CAPILLARY: 108 mg/dL — AB (ref 70–99)
Glucose-Capillary: 274 mg/dL — ABNORMAL HIGH (ref 70–99)
Glucose-Capillary: 66 mg/dL — ABNORMAL LOW (ref 70–99)
Glucose-Capillary: 132 mg/dL — ABNORMAL HIGH (ref 70–99)

## 2014-01-01 MED ORDER — TORSEMIDE 20 MG PO TABS: 80.0000 mg | ORAL_TABLET | Freq: Two times a day (BID) | ORAL | Status: DC

## 2014-01-01 MED ORDER — AMIODARONE HCL 200 MG PO TABS: 200.0000 mg | ORAL_TABLET | Freq: Every day | ORAL | Status: DC

## 2014-01-01 MED ORDER — APIXABAN 2.5 MG PO TABS: 2.5000 mg | ORAL_TABLET | Freq: Two times a day (BID) | ORAL | Status: DC

## 2014-01-01 MED ORDER — ISOSORBIDE MONONITRATE ER 60 MG PO TB24: 30.0000 mg | ORAL_TABLET | Freq: Every day | ORAL | Status: DC

## 2014-01-01 MED ORDER — METOPROLOL TARTRATE 25 MG PO TABS
25.0000 mg | ORAL_TABLET | Freq: Every day | ORAL | Status: DC
  Administered 2014-01-01 – 2014-01-02 (×2): 25 mg via ORAL
  Filled 2014-01-01: qty 1

## 2014-01-01 NOTE — Discharge Instructions (Signed)
Inpatient Rehab Discharge Instructions  [name] Discharge date and time: 01/02/14   Activities/Precautions/ Functional Status: Activity: no lifting, driving, or strenuous exercise for till cleared by MD Diet: cardiac diet Wound Care: none needed Functional status:  ___ No restrictions     ___ Walk up steps independently _X__ 24/7 supervision/assistance   ___ Walk up steps with assistance ___ Intermittent supervision/assistance  ___ Bathe/dress independently ___ Walk with walker     ___ Bathe/dress with assistance ___ Walk Independently    ___ Shower independently ___ Walk with assistance    _X__ Shower with assistance _X__ No alcohol     ___ Return to work/school ________  Special Instructions: 1. Weight yourself daily. 2. Use flutter valve at least 4 times a day.  COMMUNITY REFERRALS UPON DISCHARGE:   None:  IF CHANGES HIS MIND TO CONTACT SOCIAL WORKER AND WILL BE ARRANGED NEXT WEEK  Medical Equipment/Items Ordered:TUB BENCH  Agency/Supplier:[facility]    [phone]   My questions have been answered and I understand these instructions. I will adhere to these goals and the provided educational materials after my discharge from the hospital.  Patient/Caregiver Signature _______________________________ Date __________  Clinician Signature _______________________________________ Date __________  Please bring this form and your medication list with you to all your follow-up doctor's appointments.

## 2014-01-01 NOTE — Progress Notes (Signed)
80 y.o. male with history of DM, CAD, severe pulmonary HTN, CKD, A fib, recent admission for increase in dyspnea and angina 08/7-08/10/15. He was readmitted on 08/11/125 with generalized weakness, fall and dehydration. He was treated with IVF but developed acute on chronic diastolic HF due to rehydration. Foley placed due to recurrent urinary retention  Subjective/Complaints: Asked about amiodarone, reviewed cardiology d/c rec was to cont 275m qhs  Review of Systems - Negative except leg weakness  Objective: Vital Signs: Blood pressure 109/51, pulse 64, temperature 98.3 F (36.8 C), temperature source Oral, resp. rate 18, weight 88.8 kg (195 lb 12.3 oz), SpO2 99.00%. No results found. Results for orders placed during the hospital encounter of 12/29/13 (from the past 72 hour(s))  GLUCOSE, CAPILLARY     Status: Abnormal   Collection Time    12/29/13  4:49 PM      Result Value Ref Range   Glucose-Capillary 262 (*) 70 - 99 mg/dL  URINE CULTURE     Status: None   Collection Time    12/29/13  9:00 PM      Result Value Ref Range   Specimen Description URINE, CLEAN CATCH     Special Requests NONE     Culture  Setup Time       Value: 12/30/2013 00:46     [facility]      Value: NO GROWTH     [facility]formed at [facility]  Culture       Value: NO GROWTH     Performed at [facility]  Report Status 12/31/2013 FINAL    URINALYSIS, ROUTINE W REFLEX MICROSCOPIC     Status: None   Collection Time    12/29/13  9:00 PM      Result Value Ref Range   Color, Urine YELLOW  YELLOW   APPearance CLEAR  CLEAR   Specific Gravity, Urine 1.012  1.005 - 1.030   pH 6.5  5.0 - 8.0   Glucose, UA NEGATIVE  NEGATIVE mg/dL   Hgb urine dipstick NEGATIVE  NEGATIVE   Bilirubin Urine NEGATIVE  NEGATIVE   Ketones, ur NEGATIVE  NEGATIVE mg/dL   Protein, ur NEGATIVE  NEGATIVE mg/dL   Urobilinogen, UA 1.0  0.0 - 1.0 mg/dL   Nitrite NEGATIVE  NEGATIVE   Leukocytes, UA NEGATIVE  NEGATIVE   Comment: MICROSCOPIC NOT DONE ON URINES WITH NEGATIVE PROTEIN, BLOOD, LEUKOCYTES, NITRITE, OR GLUCOSE <1000 mg/dL.  GLUCOSE, CAPILLARY     Status: Abnormal   Collection Time    12/29/13  9:36 PM      Result Value Ref Range   Glucose-Capillary 216 (*) 70 - 99 mg/dL  CBC WITH DIFFERENTIAL     Status: Abnormal   Collection Time    12/30/13  6:27 AM      Result Value Ref Range   WBC 6.1  4.0 - 10.5 K/uL   RBC 3.42 (*) 4.22 - 5.81 MIL/uL   Hemoglobin 11.1 (*) 13.0 - 17.0 g/dL   HCT 33.2 (*) 39.0 - 52.0 %   MCV 97.1  78.0 - 100.0 fL   MCH 32.5  26.0 - 34.0 pg   MCHC 33.4  30.0 - 36.0 g/dL   RDW 14.9  11.5 - 15.5 %   Platelets 219  150 - 400 K/uL   Neutrophils Relative % 60  43 - 77 %   Neutro Abs 3.7  1.7 - 7.7 K/uL   Lymphocytes  Relative 28  12 - 46 %   Lymphs Abs 1.7  0.7 - 4.0 K/uL   Monocytes Relative 9  3 - 12 %   Monocytes Absolute 0.5  0.1 - 1.0 K/uL   Eosinophils Relative 3  0 - 5 %   Eosinophils Absolute 0.2  0.0 - 0.7 K/uL   Basophils Relative 0  0 - 1 %   Basophils Absolute 0.0  0.0 - 0.1 K/uL  COMPREHENSIVE METABOLIC PANEL     Status: Abnormal   Collection Time    12/30/13  6:27 AM      Result Value Ref Range   Sodium 141  137 - 147 mEq/L   Potassium 3.6 (*) 3.7 - 5.3 mEq/L   Chloride 97  96 - 112 mEq/L   CO2 32  19 - 32 mEq/L   Glucose, Bld 116 (*) 70 - 99 mg/dL   BUN 35 (*) 6 - 23 mg/dL   Creatinine, Ser 2.30 (*) 0.50 - 1.35 mg/dL   Calcium 9.2  8.4 - 10.5 mg/dL   Total Protein 6.4  6.0 - 8.3 g/dL   Albumin 2.9 (*) 3.5 - 5.2 g/dL   AST 48 (*) 0 - 37 U/L   ALT 58 (*) 0 - 53 U/L   Alkaline Phosphatase 55  39 - 117 U/L   Total Bilirubin 0.3  0.3 - 1.2 mg/dL   GFR calc non Af Amer 23 (*) >90 mL/min   GFR calc Af Amer 27 (*) >90 mL/min   Comment: (NOTE)     The eGFR has been calculated using the CKD EPI equation.     This calculation has not been validated in all clinical situations.     eGFR's persistently <90 mL/min signify  possible Chronic Kidney     Disease.   Anion gap 12  5 - 15  GLUCOSE, CAPILLARY     Status: Abnormal   Collection Time    12/30/13  6:48 AM      Result Value Ref Range   Glucose-Capillary 118 (*) 70 - 99 mg/dL  GLUCOSE, CAPILLARY     Status: Abnormal   Collection Time    12/30/13 11:27 AM      Result Value Ref Range   Glucose-Capillary 283 (*) 70 - 99 mg/dL  GLUCOSE, CAPILLARY     Status: Abnormal   Collection Time    12/30/13 11:28 AM      Result Value Ref Range   Glucose-Capillary 285 (*) 70 - 99 mg/dL   Comment 1 Notify RN    GLUCOSE, CAPILLARY     Status: Abnormal   Collection Time    12/30/13  4:26 PM      Result Value Ref Range   Glucose-Capillary 178 (*) 70 - 99 mg/dL   Comment 1 Notify RN    GLUCOSE, CAPILLARY     Status: Abnormal   Collection Time    12/30/13  8:59 PM      Result Value Ref Range   Glucose-Capillary 184 (*) 70 - 99 mg/dL  GLUCOSE, CAPILLARY     Status: Abnormal   Collection Time    12/31/13  7:11 AM      Result Value Ref Range   Glucose-Capillary 144 (*) 70 - 99 mg/dL   Comment 1 Notify RN    GLUCOSE, CAPILLARY     Status: Abnormal   Collection Time    12/31/13 11:39 AM      Result Value Ref Range   Glucose-Capillary 217 (*)  70 - 99 mg/dL   Comment 1 Notify RN    GLUCOSE, CAPILLARY     Status: Abnormal   Collection Time    12/31/13  4:49 PM      Result Value Ref Range   Glucose-Capillary 119 (*) 70 - 99 mg/dL   Comment 1 Notify RN    GLUCOSE, CAPILLARY     Status: Abnormal   Collection Time    12/31/13  9:03 PM      Result Value Ref Range   Glucose-Capillary 172 (*) 70 - 99 mg/dL     HEENT: normal Cardio: RRR and no murmur Resp: CTA B/L and unlabored GI: BS positive and NT,ND Extremity:  No Edema Skin:   Intact Neuro: Alert/Oriented, Abnormal Sensory diminished LT Left dorsum foot and Abnormal Motor -3/5 Left ankle DF, 4- R ankle DF Musc/Skel:  Normal GEN NAD   Assessment/Plan: 1. Functional deficits secondary to  deconditioning secondary to multiple medical issues which require 3+ hours per day of interdisciplinary therapy in a comprehensive inpatient rehab setting. Physiatrist is providing close team supervision and 24 hour management of active medical problems listed below. Physiatrist and rehab team continue to assess barriers to discharge/monitor patient progress toward functional and medical goals. FIM: FIM - Bathing Bathing Steps Patient Completed: Chest;Right Arm;Left Arm;Abdomen;Front perineal area;Buttocks;Right upper leg;Left upper leg Bathing: 4: Min-Patient completes 8-9 44f10 parts or 75+ percent  FIM - Upper Body Dressing/Undressing Upper body dressing/undressing steps patient completed: Thread/unthread right sleeve of pullover shirt/dresss;Thread/unthread left sleeve of pullover shirt/dress;Put head through opening of pull over shirt/dress;Pull shirt over trunk Upper body dressing/undressing: 5: Set-up assist to: Obtain clothing/put away FIM - Lower Body Dressing/Undressing Lower body dressing/undressing steps patient completed: Thread/unthread right underwear leg;Pull underwear up/down;Thread/unthread right pants leg;Thread/unthread left pants leg;Pull pants up/down Lower body dressing/undressing: 3: Mod-Patient completed 50-74% of tasks     FIM - TAir cabin crewTransfers: 4-To toilet/BSC: Min A (steadying Pt. > 75%);4-From toilet/BSC: Min A (steadying Pt. > 75%)  FIM - BControl and instrumentation engineerDevices: Walker;Arm rests Bed/Chair Transfer: 5: Bed > Chair or W/C: Supervision (verbal cues/safety issues);5: Chair or W/C > Bed: Supervision (verbal cues/safety issues)  FIM - Locomotion: Wheelchair Locomotion: Wheelchair: 0: Activity did not occur FIM - Locomotion: Ambulation Locomotion: Ambulation Assistive Devices: WAdministratorAmbulation/Gait Assistance: 5: Supervision;4: Min guard Locomotion: Ambulation: 4: Travels 150 ft or more with minimal  assistance (Pt.>75%)  Comprehension Comprehension Mode: Auditory Comprehension: 6-Follows complex conversation/direction: With extra time/assistive device  Expression Expression Mode: Verbal Expression: 6-Expresses complex ideas: With extra time/assistive device  Social Interaction Social Interaction: 7-Interacts appropriately with others - No medications needed.  Problem Solving Problem Solving: 5-Solves basic 90% of the time/requires cueing < 10% of the time  Memory Memory: 6-More than reasonable amt of time  Medical Problem List and Plan:   1. Functional deficits secondary to Deconditioning due to multiple medical issues. (Hx of left foot drop)   2. DVT Prophylaxis/Anticoagulation: Pharmaceutical: Other (comment)--Eliquis   3. Pain Management: N/A   4. Mood: LCSW to follow for evaluation and support. Has good outlook and wants to get back to work.   5. Neuropsych: This patient is capable of making decisions on his own behalf.   6. Skin/Wound Care: Encourage out of be activity. Maintain adequate nutritional and hydration status.   7. CHF: Compensated at current time with improvement of respiratory status. Continue Imdur, metoprolol, hydralazine and Demadex.   8. DM type 2: Will monitor BS  with ac/hs checks. Po intake is good. Continue lantus 12 units at bedtime with SSI for elevated BS.   9. A fib: Will monitor heart rate tid. Continue amiodarone and metoprolol for rate control.  will write parameters 10. Urinary retention: resolved. Continue Avodart and Rapflo. Check UA/UCS. 11.  HypoK, judicious supplementation given CKD, recheck BMET prior to D/C  LOS (Days) 3 A FACE TO FACE EVALUATION WAS PERFORMED  Hulan Szumski E 01/01/2014, 6:53 AM

## 2014-01-01 NOTE — Progress Notes (Signed)
Social Work Patient ID: [name], male   DOB: 1919/03/09, 80 y.o.   MRN: [id number] Met with pt and assistant to discuss follow up therapies ant discharge.  He feels he is doing well and can do what he is doing at home. He plans to be in his medical office next week.  Discussed OP versus Home health therapies and he feels it is not necessary at this time. Have given my card so if changes his mind he will contact me next week and this worker will arrange follow up.  PT-Shamal Stracener agreeable to this and felt he is back To baseline functioning.

## 2014-01-01 NOTE — Progress Notes (Signed)
Occupational Therapy Session Note  Patient Details  Name: [name] MRN: [id number] Date of Birth: 04/28/1919  Today's Date: 01/01/2014 OT Individual Time: 0900-1000 OT Individual Time Calculation (min): 60 min   Short Term Goals: Week 1:  OT Short Term Goal 1 (Week 1): LTGs equal to supervision level seconary to ELOS.  Skilled Therapeutic Interventions/Progress Updates:    Pt performed grooming sitting at the sink in his chair with modified independence.  He then transitioned to the walk-in shower with supervision using the RW.  Min instructional cueing for positioning of the walker when attempting to sit down on the smaller shower seat.  Pt performed all bathing with min assist and then ambulated back to the sink for dressing tasks.  Increased time needed but he was able to perform all aspects except for donning his socks, AFO, and shoes.  Pt's caregiver assisted with this which is normally how they perform the task at home.  He did require min assist for donning the lumbar corset in standing.   Therapy Documentation Precautions:  Precautions Precautions: Fall Required Braces or Orthoses: Other Brace/Splint;Spinal Brace Spinal Brace: Lumbar corset;Applied in sitting position;Other (comment) Spinal Brace Comments: long standing for comfort grossly 25years Other Brace/Splint: AFO LLE Restrictions Weight Bearing Restrictions: No  Pain: Pain Assessment Pain Assessment: No/denies pain ADL: See FIM for current functional status  Therapy/Group: Individual Therapy  Allisa Einspahr OTR/L 01/01/2014, 10:11 AM

## 2014-01-01 NOTE — Progress Notes (Signed)
Social Work Discharge Note Discharge Note  The overall goal for the admission was met for:   Discharge location: [place]  Length of Stay: Yes-4 DAYS  Discharge activity level: Yes-MOD/I-SUPERVISION LEVEL  Home/community participation: Yes  Services provided included: MD, RD, PT, OT, RN, CM, Pharmacy and [place]: Medicare and Private Insurance: [place]  Follow-up services arranged: DME: [facility]TUB [facility] and Patient/Family has no preference for HH/DME agencies  Comments (or additional information):PT AND MARTHA FEEL HE IS BACK TO [place]  Patient/Family verbalized understanding of follow-up arrangements: Yes  Individual responsible for coordination of the follow-up plan: SELF & MARTHA-ASSISTANT  Confirmed correct DME delivered: Elease Hashimoto 01/01/2014    Elease Hashimoto

## 2014-01-01 NOTE — Progress Notes (Signed)
Hypoglycemic Event  CBG: 66  Treatment: 15 GM carbohydrate snack orange juice  Symptoms: None none  Follow-up CBG: Time:1800 CBG Result:132  Possible Reasons for Event: Unknown unknown  Comments/MD notified:Pam Love PA-C    Kayce Chismar, Lawana Pai  Remember to initiate Hypoglycemia Order Set & complete

## 2014-01-01 NOTE — IPOC Note (Signed)
Overall Plan of Care [facility]) Patient Details Name: [name] MRN: [id number] DOB: January 31, 1919  Admitting Diagnosis: Deconditioned   Hospital Problems: Active Problems:   Physical deconditioning     Functional Problem List: Nursing Endurance;Medication Management;Safety  PT Balance;Endurance;Motor;Safety;Sensory  OT Balance;Endurance  SLP    TR         Basic ADL's: OT Grooming;Bathing;Dressing;Toileting     Advanced  ADL's: OT       Transfers: PT Bed Mobility;Bed to Chair;Car;Furniture  OT Toilet;Tub/Shower     Locomotion: PT Ambulation;Stairs     Additional Impairments: OT None  SLP        TR      Anticipated Outcomes Item Anticipated Outcome  Self Feeding independent  Swallowing      Basic self-care  min assist level  Toileting  supervision   Bathroom Transfers supervision  Bowel/Bladder  mod I  Transfers  mod I  Locomotion  mod I to supervision  Communication     Cognition     Pain  < 3  Safety/Judgment  supervision   Therapy Plan: PT Intensity: Minimum of 1-2 x/day ,45 to 90 minutes PT Frequency: 5 out of 7 days PT Duration Estimated Length of Stay: 5-7 days OT Intensity: Minimum of 1-2 x/day, 45 to 90 minutes OT Frequency: 5 out of 7 days OT Duration/Estimated Length of Stay: 3-5 days         Team Interventions: Nursing Interventions Patient/Family Education;Disease Management/Prevention;Medication Management;Discharge Planning  PT interventions Ambulation/gait training;Balance/vestibular training;Discharge planning;DME/adaptive equipment instruction;Functional mobility training;Neuromuscular re-education;Patient/family education;Stair training;Therapeutic Activities;Therapeutic Exercise;UE/LE Strength taining/ROM;UE/LE Coordination activities  OT Interventions Balance/vestibular training;DME/adaptive equipment instruction;Discharge planning;Patient/family education;UE/LE Strength taining/ROM;UE/LE Coordination  activities;Therapeutic Activities;Self Care/advanced ADL retraining;Neuromuscular re-education;Therapeutic Exercise  SLP Interventions    TR Interventions    SW/CM Interventions Discharge Planning;Psychosocial Support;Patient/Family Education    Team Discharge Planning: Destination: PT-Home ,OT- Home , SLP-  Projected Follow-up: PT-None, OT-   , SLP-  Projected Equipment Needs: PT-None recommended by PT, OT- Tub/shower bench, SLP-  Equipment Details: PT-Pt has rollator, may need tub bench, OT-  Patient/family involved in discharge planning: PT- Patient;Family member/caregiver,  OT-Patient;Other (comment) (hired caregiver), SLP-   MD ELOS: 5-7 d Medical Rehab Prognosis:  Good Assessment: 80 y.o. male with history of DM, CAD, severe pulmonary HTN, CKD, A fib, recent admission for increase in dyspnea and angina 08/7-08/10/15. He was readmitted on 08/11/125 with generalized weakness, fall and dehydration. He was treated with IVF but developed acute on chronic diastolic HF due to rehydration. Foley placed due to recurrent urinary retention   Now requiring 24/7 Rehab RN,MD, as well as CIR level PT, OT .  Treatment team will focus on ADLs and mobility with goals set at Sup/Mod I      See Team Conference Notes for weekly updates to the plan of care

## 2014-01-01 NOTE — Progress Notes (Signed)
Physical Therapy Discharge Summary  Patient Details  Name: [name] MRN: [id number] Date of Birth: Jan 01, 1919  Today's Date: 01/01/2014 PT Individual Time: 4235-3614 PT Individual Time Calculation (min): 60 min    Patient has met 6 of 8 long term goals due to improved activity tolerance, improved balance, improved postural control and increased strength.  Patient to discharge at an ambulatory level Modified Independent-supervision.   Patient's care partner is independent to provide the necessary physical assistance at discharge.  Reasons goals not met: Patient continues to require supervision and UE support for balance due to frequent posterior LOB, B knee buckling, and ineffective balance strategies. Patient continues to require supervision for cuing for safe transfers for locking brakes on rollator and appropriate hand placement.   Recommendation:  Patient will benefit from HEP for generalized strengthening and balance with supervision of family and/or assistant.   Equipment: No equipment provided-pt has rollator  Reasons for discharge: treatment goals met and discharge from hospital  Patient/family agrees with progress made and goals achieved: Yes  Skilled Treatment Patient received sitting in bedside chair, agreeable to therapy. Pt performed standing balance without UE support up to 30-45 sec with close supervision before requiring assist and seated rest. Gait training with rollator throughout rehab unit in controlled environment 2 x 150 ft and 2 x 50 ft, home environment x 25 ft with mod I. Pt demonstrates flexed forward posture at trunk, unable to correct due to hx of back problems and occasionally scuffs LLE against RLE during swing phase due to decreased proprioception. Pt performed bed mobility in ADL apartment on regular bed with mod I and furniture transfers and sit <> stand transfers with supervision as pt continues to require cues for safe use of rollator/hand placement for  transfers. Pt performed sit <> stand from mat table with focus on anterior weightshift and not pushing back of legs against seat to stand for improved safety and standing balance as pt tends to stand with posterior weightshift/LOB. Pt performed car transfer with supervision. Gait training in community and outdoor environment, multiple bouts 50-150 ft using rollator with supervision, including in mildly crowded hospital lobby, on/off elevators, over thresholds, on tile, brick, and concrete, and inclines/declines. Patient instructed in HEP for LE strengthening and balance with supervision provided by family/asst. Pt reports feeling as though he is back to baseline and will continue with PTA exercise/activity level. No further questions regarding discharge.   PT Discharge Precautions/Restrictions Precautions Precautions: Fall Required Braces or Orthoses: Other Brace/Splint;Spinal Brace Spinal Brace: Lumbar corset;Applied in sitting position;Other (comment) Spinal Brace Comments: long standing for comfort grossly 25years Other Brace/Splint: AFO LLE Restrictions Weight Bearing Restrictions: No Vital Signs Therapy Vitals Temp: 98.3 F (36.8 C) Temp src: Oral Pulse Rate: 64 Resp: 18 BP: 109/51 mmHg Patient Position (if appropriate): Lying Oxygen Therapy SpO2: 99 % O2 Device: None (Room air) Pain Pain Assessment Pain Assessment: No/denies pain Vision/Perception   No changes from baseline  Cognition Overall Cognitive Status: Within Functional Limits for tasks assessed Arousal/Alertness: Awake/alert Orientation Level: Oriented X4 Attention: Sustained Sustained Attention: Appears intact Memory: Appears intact Awareness: Appears intact Problem Solving: Appears intact Safety/Judgment: Appears intact Sensation Sensation Light Touch: Impaired Detail Light Touch Impaired Details: Impaired RUE;Impaired LUE;Impaired LLE;Impaired RLE (longstanding hx of neuropathy affecting lower  leg/foot) Stereognosis: Appears Intact Hot/Cold: Appears Intact Proprioception: Appears Intact Additional Comments: Pt reports occasional numbness in the end of his digits. Coordination Gross Motor Movements are Fluid and Coordinated: Yes Fine Motor Movements are Fluid and  Coordinated: Yes Motor  Motor Motor: Abnormal postural alignment and control Motor - Skilled Clinical Observations: Pt maintains flexed trunk in standing, and demonstrates left foot drop (has AFO)  Mobility Bed Mobility Bed Mobility: Supine to Sit;Sit to Supine Supine to Sit: 6: Modified independent (Device/Increase time) Sit to Supine: 6: Modified independent (Device/Increase time) Transfers Sit to Stand: 5: Supervision;With upper extremity assist;From chair/3-in-1 Sit to Stand Details: Verbal cues for technique;Verbal cues for precautions/safety Stand to Sit: To chair/3-in-1;With armrests;5: Supervision Stand to Sit Details (indicate cue type and reason): Verbal cues for technique Locomotion  Ambulation Ambulation: Yes Ambulation/Gait Assistance: 6: Modified independent (Device/Increase time) Ambulation Distance (Feet): 150 Feet Assistive device: Rollator Gait Gait: Yes Gait Pattern: Impaired Gait Pattern: Decreased stride length;Decreased dorsiflexion - left;Trunk flexed;Step-through pattern;Decreased hip/knee flexion - left;Decreased hip/knee flexion - right (occasionally scuffs LLE against RLE in swing phase due to poor proprioception) Stairs / Additional Locomotion Stairs: Yes Stairs Assistance: 5: Supervision Stair Management Technique: Two rails;Forwards Number of Stairs: 5 Height of Stairs: 6 Wheelchair Mobility Wheelchair Mobility: No (pt ambulatory)  Trunk/Postural Assessment  Cervical Assessment Cervical Assessment: Exceptions to [facility] Cervical Strength Overall Cervical Strength Comments: Pt demonstrates slight cervical protraction. Thoracic Assessment Thoracic Assessment: Exceptions to  [facility] Thoracic Strength Overall Thoracic Strength Comments: Pt with flexed trunk in standing. Lumbar Assessment Lumbar Assessment: Exceptions to [facility] Lumbar Strength Overall Lumbar Strength Comments: Pt unable to achieve upright standing and maintains lumbar flexion Postural Control Postural Control: Deficits on evaluation Righting Reactions: delayed/ineffective balance strategies Protective Responses: delayed/ineffective balance strategies, posterior LOB Balance Balance Balance Assessed: Yes Standardized Balance Assessment Standardized Balance Assessment: Berg Balance Test Berg Balance Test Sit to Stand: Able to stand using hands after several tries Standing Unsupported: Needs several tries to stand 30 seconds unsupported Sitting with Back Unsupported but Feet Supported on Floor or Stool: Able to sit safely and securely 2 minutes Stand to Sit: Sits independently, has uncontrolled descent Transfers: Able to transfer with verbal cueing and /or supervision Standing Unsupported with Eyes Closed: Needs help to keep from falling Standing Ubsupported with Feet Together: Needs help to attain position and unable to hold for 15 seconds From Standing, Reach Forward with Outstretched Arm: Loses balance while trying/requires external support From Standing Position, Pick up Object from Floor: Unable to try/needs assist to keep balance From Standing Position, Turn to Look Behind Over each Shoulder: Needs assist to keep from losing balance and falling Turn 360 Degrees: Needs assistance while turning Standing Unsupported, Alternately Place Feet on Step/Stool: Needs assistance to keep from falling or unable to try Standing Unsupported, One Foot in Front: Loses balance while stepping or standing Standing on One Leg: Unable to try or needs assist to prevent fall Total Score: 10/56 Extremity Assessment  RUE Assessment RUE Assessment: Within Functional Limits LUE Assessment LUE Assessment: Exceptions to  [facility] LUE Strength LUE Overall Strength Comments: Pt with limited AROM shoulder flexion 0-80 degrees secondary to history of rotator cuff injury.  All other joints AROM WFLs and strength also WFLs RLE Assessment RLE Assessment: Within Functional Limits LLE Assessment LLE Assessment: Exceptions to [facility] LLE Strength LLE Overall Strength: Deficits LLE Overall Strength Comments: 4 to 4+/5 throughout except  hx of foot drop with ankle DF 1/5 and PF 2+/5  See FIM for current functional status  Laretta Alstrom 01/01/2014, 8:28 AM

## 2014-01-01 NOTE — Progress Notes (Addendum)
Occupational Therapy Discharge Summary  Patient Details  Name: KOBYN KRAY MRN: [id number] Date of Birth: [birth year]  Today's Date: 01/01/2014 OT Individual Time: 1405-1505 OT Individual Time Calculation (min): 60 min  Session Note: Pt ambulated down to the therapy gym with supervision using his rollator.  He was able to perform the Nustep to begin session for 12 mins total and intervals of 5 mins, 3 mins, and 4 mins respectively.  He perform the first 5 mins on level 3 resistance with use of both the UEs and LEs.  He needed frequent re-positioning of the LLE as his LE would externally rotate off of the foot peddle.  He was able to perform the second interval of 3 mins on level 3 resistance as well but just using the LEs.  Therapist provided theraband to help maintain the LLE on the peddle for the last 2 sets.  He reports level 13 on the perceived exertion scale after the second set.  Finished with 3 mins of using both UEs and LEs on level 6.  Pt able to maintain 55-60 steps with HR no higher than 64 BPM and O2 sats maintaining 98 %.  Finished session by working on sit to stand and standing balance while engaged in therapeutic activity.  Pt needed mod assist to maintain balance when attempting to stand unsupported.  Pt ambulated back to room after 6 intervals of standing.    Patient has met 4 of 4 long term goals due to improved balance.  Patient to discharge at overall Supervision level.  Patient's care partner is independent to provide the necessary physical and cognitive assistance at discharge.    Reasons goals not met: NA secondary to all LTGs met  Recommendation:  Feel pt does not warrant any further OT needs at this time.   Equipment: No equipment provided  Reasons for discharge: treatment goals met and discharge from hospital  Patient/family agrees with progress made and goals achieved: Yes  OT Discharge Precautions/Restrictions  Precautions Precautions: Fall Required Braces  or Orthoses: Other Brace/Splint;Spinal Brace Spinal Brace: Lumbar corset;Applied in sitting position;Other (comment) Spinal Brace Comments: long standing for comfort grossly 25years Other Brace/Splint: AFO LLE Restrictions Weight Bearing Restrictions: No  Vital Signs Therapy Vitals Temp: 98.4 F (36.9 C) Temp src: Oral Pulse Rate: 64 Resp: 16 BP: 102/51 mmHg Patient Position (if appropriate): Sitting Oxygen Therapy SpO2: 99 % O2 Device: None (Room air) Pain Pain Assessment Pain Assessment: No/denies pain ADL  See FIM scale for details  Vision/Perception  Vision- History Baseline Vision/History: Wears glasses Wears Glasses: At all times Patient Visual Report: No change from baseline Vision- Assessment Vision Assessment?: No apparent visual deficits  Cognition Overall Cognitive Status: Within Functional Limits for tasks assessed Arousal/Alertness: Awake/alert Orientation Level: Oriented X4 Attention: Sustained Sustained Attention: Appears intact Memory: Impaired Awareness: Appears intact Safety/Judgment: Impaired Comments: Pt continues to need min instructional cueing to remember reaching back to the chair when sitting down and for pushing up from the chair during transfers. Sensation Sensation Light Touch Impaired Details: Impaired RUE;Impaired LUE Stereognosis: Appears Intact Hot/Cold: Appears Intact Proprioception: Appears Intact Additional Comments: Pt reports occasional numbness in the end of his digits. Coordination Gross Motor Movements are Fluid and Coordinated: Yes Fine Motor Movements are Fluid and Coordinated: Yes Motor  Motor Motor: Abnormal postural alignment and control Motor - Discharge Observations: Pt maintains flexed trunk in standing, and demonstrates left foot drop (has AFO). Mobility  Transfers Transfers: Sit to Stand Sit to Stand: 5: Supervision;With upper  extremity assist;From chair/3-in-1 Sit to Stand Details: Verbal cues for  technique;Verbal cues for precautions/safety Stand to Sit: To chair/3-in-1;With armrests;5: Supervision Stand to Sit Details (indicate cue type and reason): Verbal cues for technique  Trunk/Postural Assessment  Cervical Assessment Cervical Assessment: Exceptions to [facility] Cervical Strength Overall Cervical Strength Comments: Pt demonstrates slight cervical protraction. Thoracic Assessment Thoracic Assessment: Exceptions to [facility] Thoracic Strength Overall Thoracic Strength Comments: Pt with flexed trunk in standing. Lumbar Assessment Lumbar Assessment: Exceptions to [facility] Lumbar Strength Overall Lumbar Strength Comments: Pt unable to achieve upright standing and maintains lumbar flexion Postural Control Postural Control: Deficits on evaluation Righting Reactions: Pt with delayed balance reactions.  He demonstrates increased LOB posteriorly in standing.  Balance Balance Balance Assessed: Yes Static Standing Balance Static Standing - Balance Support: Right upper extremity supported;Left upper extremity supported Static Standing - Level of Assistance: 5: Stand by assistance Dynamic Standing Balance Dynamic Standing - Balance Support: Right upper extremity supported;Left upper extremity supported Dynamic Standing - Level of Assistance: 5: Stand by assistance Dynamic Standing - Comments: Pt is able to maintain standing and perform mobility with UE support on the rollator and supervision.  Pt needs min to mod assist for static and dynamic standing during activities if UE support is note available.  Extremity/Trunk Assessment RUE Assessment RUE Assessment: Within Functional Limits LUE Assessment LUE Assessment: Exceptions to [facility] LUE Strength LUE Overall Strength Comments: Pt with limited AROM shoulder flexion 0-80 degrees secondary to history of rotator cuff injury.  All other joints AROM WFLs and strength also WFLs  See FIM for current functional status  Mercedies Ganesh OTR/L 01/01/2014, 4:05  PM

## 2014-01-02 LAB — GLUCOSE, CAPILLARY: GLUCOSE-CAPILLARY: 124 mg/dL — AB (ref 70–99)

## 2014-01-02 NOTE — Progress Notes (Signed)
80 y.o. male with history of DM, CAD, severe pulmonary HTN, CKD, A fib, recent admission for increase in dyspnea and angina 08/7-08/10/15. He was readmitted on 08/11/125 with generalized weakness, fall and dehydration. He was treated with IVF but developed acute on chronic diastolic HF due to rehydration. Foley placed due to recurrent urinary retention  Subjective/Complaints: Pt without new c/os, CBG low 440pm yest  Per caregiver, this happens at home at times  Review of Systems - Negative except leg weakness  Objective: Vital Signs: Blood pressure 120/57, pulse 64, temperature 98.6 F (37 C), temperature source Oral, resp. rate 17, weight 85.8 kg (189 lb 2.5 oz), SpO2 100.00%. No results found. Results for orders placed during the hospital encounter of 12/29/13 (from the past 72 hour(s))  GLUCOSE, CAPILLARY     Status: Abnormal   Collection Time    12/30/13 11:27 AM      Result Value Ref Range   Glucose-Capillary 283 (*) 70 - 99 mg/dL  GLUCOSE, CAPILLARY     Status: Abnormal   Collection Time    12/30/13 11:28 AM      Result Value Ref Range   Glucose-Capillary 285 (*) 70 - 99 mg/dL   Comment 1 Notify RN    GLUCOSE, CAPILLARY     Status: Abnormal   Collection Time    12/30/13  4:26 PM      Result Value Ref Range   Glucose-Capillary 178 (*) 70 - 99 mg/dL   Comment 1 Notify RN    GLUCOSE, CAPILLARY     Status: Abnormal   Collection Time    12/30/13  8:59 PM      Result Value Ref Range   Glucose-Capillary 184 (*) 70 - 99 mg/dL  GLUCOSE, CAPILLARY     Status: Abnormal   Collection Time    12/31/13  7:11 AM      Result Value Ref Range   Glucose-Capillary 144 (*) 70 - 99 mg/dL   Comment 1 Notify RN    GLUCOSE, CAPILLARY     Status: Abnormal   Collection Time    12/31/13 11:39 AM      Result Value Ref Range   Glucose-Capillary 217 (*) 70 - 99 mg/dL   Comment 1 Notify RN    GLUCOSE, CAPILLARY     Status: Abnormal   Collection Time    12/31/13  4:49 PM      Result Value Ref  Range   Glucose-Capillary 119 (*) 70 - 99 mg/dL   Comment 1 Notify RN    GLUCOSE, CAPILLARY     Status: Abnormal   Collection Time    12/31/13  9:03 PM      Result Value Ref Range   Glucose-Capillary 172 (*) 70 - 99 mg/dL  GLUCOSE, CAPILLARY     Status: Abnormal   Collection Time    01/01/14  7:14 AM      Result Value Ref Range   Glucose-Capillary 132 (*) 70 - 99 mg/dL  BASIC METABOLIC PANEL     Status: Abnormal   Collection Time    01/01/14 11:30 AM      Result Value Ref Range   Sodium 139  137 - 147 mEq/L   Potassium 4.4  3.7 - 5.3 mEq/L   Chloride 95 (*) 96 - 112 mEq/L   CO2 33 (*) 19 - 32 mEq/L   Glucose, Bld 285 (*) 70 - 99 mg/dL   BUN 40 (*) 6 - 23 mg/dL   Creatinine, Ser 2.65 (*)  0.50 - 1.35 mg/dL   Calcium 9.5  8.4 - 10.5 mg/dL   GFR calc non Af Amer 19 (*) >90 mL/min   GFR calc Af Amer 22 (*) >90 mL/min   Comment: (NOTE)     The eGFR has been calculated using the CKD EPI equation.     This calculation has not been validated in all clinical situations.     eGFR's persistently <90 mL/min signify possible Chronic Kidney     Disease.   Anion gap 11  5 - 15  GLUCOSE, CAPILLARY     Status: Abnormal   Collection Time    01/01/14 11:42 AM      Result Value Ref Range   Glucose-Capillary 274 (*) 70 - 99 mg/dL   Comment 1 Notify RN    GLUCOSE, CAPILLARY     Status: Abnormal   Collection Time    01/01/14  4:41 PM      Result Value Ref Range   Glucose-Capillary 66 (*) 70 - 99 mg/dL   Comment 1 Notify RN    GLUCOSE, CAPILLARY     Status: Abnormal   Collection Time    01/01/14  6:16 PM      Result Value Ref Range   Glucose-Capillary 132 (*) 70 - 99 mg/dL   Comment 1 Notify RN    GLUCOSE, CAPILLARY     Status: Abnormal   Collection Time    01/01/14  8:39 PM      Result Value Ref Range   Glucose-Capillary 108 (*) 70 - 99 mg/dL   Comment 1 Notify RN    GLUCOSE, CAPILLARY     Status: Abnormal   Collection Time    01/02/14  6:52 AM      Result Value Ref Range    Glucose-Capillary 124 (*) 70 - 99 mg/dL   Comment 1 Notify RN       HEENT: normal Cardio: RRR and no murmur Resp: CTA B/L and unlabored GI: BS positive and NT,ND Extremity:  No Edema Skin:   Intact Neuro: Alert/Oriented, Abnormal Sensory diminished LT Left dorsum foot and Abnormal Motor -3/5 Left ankle DF, 4- R ankle DF Musc/Skel:  Normal GEN NAD   Assessment/Plan: 1. Functional deficits secondary to deconditioning secondary to multiple medical issues which require 3+ hours per day of interdisciplinary therapy in a comprehensive inpatient rehab setting. Physiatrist is providing close team supervision and 24 hour management of active medical problems listed below. Physiatrist and rehab team continue to assess barriers to discharge/monitor patient progress toward functional and medical goals. FIM: FIM - Bathing Bathing Steps Patient Completed: Chest;Right Arm;Left Arm;Abdomen;Front perineal area;Buttocks;Right upper leg;Left upper leg Bathing: 4: Min-Patient completes 8-9 [id number] parts or 75+ percent  FIM - Upper Body Dressing/Undressing Upper body dressing/undressing steps patient completed: Thread/unthread right sleeve of pullover shirt/dresss;Thread/unthread left sleeve of pullover shirt/dress;Put head through opening of pull over shirt/dress;Pull shirt over trunk Upper body dressing/undressing: 5: Set-up assist to: Obtain clothing/put away FIM - Lower Body Dressing/Undressing Lower body dressing/undressing steps patient completed: Thread/unthread right underwear leg;Thread/unthread left underwear leg;Pull underwear up/down;Thread/unthread right pants leg;Thread/unthread left pants leg;Pull pants up/down Lower body dressing/undressing: 3: Mod-Patient completed 50-74% of tasks  FIM - Toileting Toileting steps completed by patient: Adjust clothing prior to toileting;Adjust clothing after toileting;Performs perineal hygiene Toileting: 5: Supervision: Safety issues/verbal cues  FIM -  TAir cabin crewTransfers Assistive Devices: Bedside commode;Walker Toilet Transfers: 5-To toilet/BSC: Supervision (verbal cues/safety issues);5-From toilet/BSC: Supervision (verbal cues/safety issues)  FIM - Bed/Chair  Financial planner Devices: Environmental consultant;Arm rests Bed/Chair Transfer: 6: Supine > Sit: No assist;6: Sit > Supine: No assist;5: Bed > Chair or W/C: Supervision (verbal cues/safety issues);5: Chair or W/C > Bed: Supervision (verbal cues/safety issues)  FIM - Locomotion: Wheelchair Locomotion: Wheelchair: 0: Activity did not occur FIM - Locomotion: Ambulation Locomotion: Ambulation Assistive Devices: Administrator Ambulation/Gait Assistance: 6: Modified independent (Device/Increase time) Locomotion: Ambulation: 6: Travels 150 ft or more independently/takes more than reasonable amount of time  Comprehension Comprehension Mode: Auditory Comprehension: 6-Follows complex conversation/direction: With extra time/assistive device  Expression Expression Mode: Verbal Expression: 6-Expresses complex ideas: With extra time/assistive device  Social Interaction Social Interaction: 7-Interacts appropriately with others - No medications needed.  Problem Solving Problem Solving: 5-Solves basic 90% of the time/requires cueing < 10% of the time  Memory Memory: 6-More than reasonable amt of time  Medical Problem List and Plan:   1. Functional deficits secondary to Deconditioning due to multiple medical issues. (Hx of left foot drop)   2. DVT Prophylaxis/Anticoagulation: Pharmaceutical: Other (comment)--Eliquis   3. Pain Management: N/A   4. Mood: LCSW to follow for evaluation and support. Has good outlook and wants to get back to work.   5. Neuropsych: This patient is capable of making decisions on his own behalf.   6. Skin/Wound Care: Encourage out of be activity. Maintain adequate nutritional and hydration status.   7. CHF: Compensated at current time with  improvement of respiratory status. Continue Imdur, metoprolol, hydralazine and Demadex.   8. DM type 2: Will monitor BS with ac/hs checks. Po intake is good. Continue lantus 12 units at bedtime with SSI for elevated BS.   9. A fib: Will monitor heart rate tid. Continue amiodarone and metoprolol for rate control.  will write parameters 10. Urinary retention: resolved. Continue Avodart and Rapflo. Check UA/UCS. 11.  HypoK, judicious supplementation given CKD, recheck BMET prior to D/C  LOS (Days) 4 A FACE TO FACE EVALUATION WAS PERFORMED  Antwuan Eckley E 01/02/2014, 7:29 AM

## 2014-01-02 NOTE — Progress Notes (Signed)
Patient discharged home at 10am with caregiver. Patient and caregiver already received all d/c instructions via Reesa Chew PA yesterday. Patient alert and oriented x 4. Vitals stable. Patient and caregiver denied any questions. Reported to caregiver on what meds patient received this morning. Patient discharged with all belongings.

## 2014-01-06 ENCOUNTER — Ambulatory Visit (INDEPENDENT_AMBULATORY_CARE_PROVIDER_SITE_OTHER): Payer: Medicare Other | Admitting: Interventional Cardiology

## 2014-01-06 ENCOUNTER — Encounter: Payer: Self-pay | Admitting: Interventional Cardiology

## 2014-01-06 VITALS — BP 132/82 | HR 64 | Ht 67.0 in | Wt 185.6 lb

## 2014-01-06 DIAGNOSIS — N289 Disorder of kidney and ureter, unspecified: Secondary | ICD-10-CM

## 2014-01-06 DIAGNOSIS — I209 Angina pectoris, unspecified: Secondary | ICD-10-CM

## 2014-01-06 LAB — BASIC METABOLIC PANEL
BUN: 39 mg/dL — ABNORMAL HIGH (ref 6–23)
CALCIUM: 9.4 mg/dL (ref 8.4–10.5)
CO2: 31 mEq/L (ref 19–32)
Chloride: 95 mEq/L — ABNORMAL LOW (ref 96–112)
Creatinine, Ser: 3.2 mg/dL — ABNORMAL HIGH (ref 0.4–1.5)
GFR: 23.24 mL/min — AB (ref >60.00)
Glucose, Bld: 216 mg/dL — ABNORMAL HIGH (ref 70–99)
Potassium: 4.5 mEq/L (ref 3.5–5.1)
SODIUM: 137 meq/L (ref 135–145)

## 2014-01-06 NOTE — Progress Notes (Signed)
Patient ID: [name], male   DOB: [birth year], 80 y.o.   MRN: [id number]    [address] [place], [place]  [address] Phone: [phone] Fax:  [phone]  Date:  01/06/2014   ID:  [name], DOB [birth year], MRN [id number]  PCP:  [name], MD   ASSESSMENT:  1. 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 CKD, stage IV 3. Hypertension,    PLAN:  1. Basic metabolic panel today 2. No change in current medical regimen other than discontinuation of Ranexa 3. Clinical followup in one month   SUBJECTIVE: [name] is a 80 y.o. male now reveals that he was over in doting influence and that perhaps this was related to weight gain. He went through rehabilitation inpatient services at [facility]. He refused to have outpatient PT. Dr. Carlis Abbott has put him on a 1500 cc fluid restriction which he is following. Weight control has been improved. He admits that he was following no real fluid restriction even though we had previously established that arm of this treatment strategy over a year ago.  Wt Readings from Last 3 Encounters:  01/06/14 185 lb 9.6 oz (84.188 kg)  01/02/14 189 lb 2.5 oz (85.8 kg)  12/29/13 186 lb 11.2 oz (84.687 kg)     Past Medical History  Diagnosis Date  . Diabetes mellitus   . Hyperlipidemia   . Coronary artery disease   . Spinal stenosis   . Pneumonia   . Pacemaker   . Anemia   . Carotid artery disease   . PVD (peripheral vascular disease)   . Hyponatremia     Chronic  . CVA (cerebral infarction)   . CKD (chronic kidney disease) stage 4, GFR 15-29 ml/min   . Long term (current) use of anticoagulants     No bleeding on Eliquis  . Dysrhythmia   . PSVT (paroxysmal supraventricular tachycardia)   . PAF (paroxysmal atrial fibrillation)     Asymptomatic. On Eliquis.  . AV block, 3rd degree     With DDD St. Jude PM, initially placed in 1993 by Dr. Nils Pyle. Device upgrade 10/2002 to DDD, by Dr. Rollene Fare  complicated by bleeding.  . Essential hypertension, benign   . CHF (congestive heart failure)   . Chronic diastolic congestive heart failure     ECHO 05/20/12 LVEF estimated by 2D at 55-60%  . Arthritis   . Shortness of breath     Current Outpatient Prescriptions  Medication Sig Dispense Refill  . allopurinol (ZYLOPRIM) 100 MG tablet Take 200 mg by mouth daily.       [name] amiodarone (PACERONE) 200 MG tablet Take 1 tablet (200 mg total) by mouth at bedtime.      [name] apixaban (ELIQUIS) 2.5 MG TABS tablet Take 1 tablet (2.5 mg total) by mouth 2 (two) times daily.  60 tablet  1  . dutasteride (AVODART) 0.5 MG capsule Take 0.5 mg by mouth daily.       . ferrous sulfate 325 (65 FE) MG tablet Take 325 mg by mouth every other day.       . fexofenadine (ALLEGRA) 180 MG tablet Take 180 mg by mouth daily.      . Flaxseed, Linseed, (BL FLAX SEED OIL PO) Take 1 capsule by mouth 2 (two) times daily.      . hydrALAZINE (APRESOLINE) 25 MG tablet Take 25 mg by mouth every 12 (twelve) hours.       [name]  insulin aspart (NOVOLOG FLEXPEN) 100 UNIT/ML FlexPen Inject 2-9 Units into the skin 3 (three) times daily with meals. Sliding scale      . insulin glargine (LANTUS) 100 UNIT/ML injection Inject 12 Units into the skin at bedtime.      . isosorbide mononitrate (IMDUR) 60 MG 24 hr tablet Take 60 mg by mouth daily.      . metoprolol tartrate (LOPRESSOR) 25 MG tablet Take 1 tablet (25 mg total) by mouth daily.  30 tablet  6  . Multiple Vitamin (MULTIVITAMIN WITH MINERALS) TABS Take 1 tablet by mouth daily. Diabetic pak by Petra Kuba from [employer]      . nitroGLYCERIN (NITROSTAT) 0.4 MG SL tablet Place 1 tablet (0.4 mg total) under the tongue every 5 (five) minutes as needed for chest pain.  25 tablet  3  . Polyethyl Glycol-Propyl Glycol (SYSTANE) 0.4-0.3 % SOLN Place 1 drop into both eyes daily as needed (dry eyes). Into affected eyes      . polyethylene glycol (MIRALAX / GLYCOLAX) packet Take 17 g by mouth 2 (two) times daily as  needed for mild constipation. For constipation      . Polyvinyl Alcohol-Povidone (REFRESH OP) Place 1 drop into both eyes 2 (two) times daily. Refresh Gel Opth      . potassium chloride SA (K-DUR,KLOR-CON) 20 MEQ tablet Take 1 tablet (20 mEq total) by mouth daily.  30 tablet  6  . silodosin (RAPAFLO) 8 MG CAPS capsule Take 8 mg by mouth daily with breakfast.      . Tafluprost (ZIOPTAN) 0.0015 % SOLN Place 1 drop into both eyes at bedtime. Use at night      . torsemide (DEMADEX) 20 MG tablet Take 80 mg by mouth 2 (two) times daily. Take 4 tablets in the morning and 4 tablets in the afternoon.       No current facility-administered medications for this visit.    Allergies:    Allergies  Allergen Reactions  . Aggrenox [Aspirin-Dipyridamole Er] Other (See Comments)    Dizziness  . Carvedilol Other (See Comments)    Dizziness  . Claritin [Loratadine] Other (See Comments)    Dizziness & drowsiness   . Mucinex [Guaifenesin Er] Other (See Comments)    Dizziness, drowsiness  . Plavix [Clopidogrel Bisulfate] Other (See Comments)    Dizziness  . Rapaflo [Silodosin] Other (See Comments)    Dizziness for about 30- 45  minutes  . Statins Other (See Comments)    rhabdomyolisis  . Travatan Z [Travoprost] Other (See Comments)    Eye irritation    Social History:  The patient  reports that he quit smoking about 55 years ago. His smoking use included Pipe. He has never used smokeless tobacco. He reports that he does not drink alcohol or use illicit drugs.   ROS:  Please see the history of present illness.   Improved appetite. Orthopnea and nocturnal angina have resolved.   All other systems reviewed and negative.   OBJECTIVE: VS:  BP 132/82  Pulse 64  Ht 5\' 7"  (1.702 m)  Wt 185 lb 9.6 oz (84.188 kg)  BMI 29.06 kg/m2 Well nourished, well developed, in no acute distress, elderly HEENT: normal Neck: JVD flat. Carotid bruit absent  Cardiac:  normal S1, S2; RRR; no murmur Lungs:  clear to  auscultation bilaterally, no wheezing, rhonchi or rales Abd: soft, nontender, no hepatomegaly Ext: Edema absent. Pulses 2+ Skin: warm and dry Neuro:  CNs 2-12 intact, no focal abnormalities noted  EKG:  Not performed       Signed, Illene Labrador III, MD 01/06/2014 9:41 AM

## 2014-01-06 NOTE — Patient Instructions (Addendum)
Your physician has recommended you make the following change in your medication:  1) STOP Ranolazine  Lab Today Bmet  You have a follow up appointment scheduled on 01/14/14 @ 3;30pm

## 2014-01-07 ENCOUNTER — Telehealth: Payer: Self-pay

## 2014-01-07 NOTE — Telephone Encounter (Signed)
pt caretaker [name] aware of Dr.Smith instructions.  Hold Demadex and potassium x24 hours. Liberalize fluid today. Call with weight in a.m. 01/08/2014.she verbalized understanding.

## 2014-01-07 NOTE — Telephone Encounter (Signed)
Message copied by Lamar Laundry on Thu Jan 07, 2014 11:43 AM ------      Message from: Daneen Schick      Created: Thu Jan 07, 2014  8:23 AM       Hold Demadex and potassium x24 hours. Liberalize fluid today. Call with weight in a.m. 01/08/2014 ------

## 2014-01-08 NOTE — Discharge Summary (Signed)
Physician Discharge Summary  Patient ID: [name] MRN: [id number] DOB/AGE: [id number] 78 y.o.  Admit date: 12/29/2013 Discharge date: 01/02/2014  Discharge Diagnoses:  Principal Problem:   Physical deconditioning Active Problems:   Hypertension   Diabetes mellitus   Chronic anticoagulation   Acute on chronic combined systolic and diastolic congestive heart failure   Discharged Condition: Stable.   Significant Diagnostic Studies: Labs:  Basic Metabolic Panel:  Recent Labs Lab 01/06/14 1012  NA 137  K 4.5  CL 95*  CO2 31  GLUCOSE 216*  BUN 39*  CREATININE 3.2*  CALCIUM 9.4    CBC: CBC Latest Ref Rng 12/30/2013 12/22/2013 12/18/2013  WBC 4.0 - 10.5 K/uL 6.1 7.1 5.6  Hemoglobin 13.0 - 17.0 g/dL 11.1(L) 11.5(L) 11.6(L)  Hematocrit 39.0 - 52.0 % 33.2(L) 34.8(L) 35.6(L)  Platelets 150 - 400 K/uL 219 193 191     CBG: No results found for this basename: GLUCAP,  in the last 168 hours  Brief HPI:   [name] is a 80 y.o. male with history of DM, CAD, severe pulmonary HTN, CKD, A fib, recent admission for increase in dyspnea and angina 08/7-08/10/15. He was readmitted on 08/11/125 with generalized weakness, fall and dehydration. He was treated with IVF but developed acute on chronic diastolic HF due to rehydration. Foley placed due to recurrent urinary retention. He responded to IV diuresis and end of life issues discussed with patient and family. Patient has chosen aggressive treatment. SOB improving on current regimen with BP controlled and chronic CKD stable. Foley discontinued and patient voiding without difficulty. Patient was deconditioned and CIR recommended by rehab team.   Hospital Course: [name] was admitted to rehab 12/29/2013 for inpatient therapies to consist of PT, ST and OT at least three hours five days a week. Past admission physiatrist, therapy team and rehab RN have worked together to provide customized collaborative inpatient rehab. He was  monitored closely for signs of overload and weight was stable at 189 lbs at discharge. He is voiding without difficulty.  Hypokalemia has resolved. Respiratory status is stable and no peripheral edema noted. He is to continue on fluid restriction as advised and is  to follow up with Dr. Loletta Specter next week for adjustment of medications as indicated. Blood sugars have been monitored on ac/Hs basis and show good control overall. Po intake is good and he is to continue to monitor BS bid-tid basis past discharge. He has progressed is modified independent to supervision level. Outpatient therapy recommended to address balance and endurance but patient has decline this. His partner will assist as needed past discharge.    Rehab course: During patient's stay in rehab weekly team conferences were held to monitor patient's progress, set goals and discuss barriers to discharge. Patient has had improvement in activity tolerance, balance, postural control, as well as ability to compensate for deficits. He requires min assist with bathing tasks, mod assist with lower body dressing and set up assist with UB dressing. He requires supervision with transfers and is able to ambulate 150 feet with increased time and [place] at modified independent level.    Disposition: 01-Home or Self Care  Diet: Cardiac diet with fluid restrictions.   Special Instructions: 1. Weight yourself daily.  2. Outpatient therapy recommended for follow up.     Medication List    STOP taking these medications       isosorbide mononitrate 60 MG 24 hr tablet  Commonly known as:  IMDUR  ranolazine 500 MG 12 hr tablet  Commonly known as:  RANEXA     torsemide 20 MG tablet  Commonly known as:  DEMADEX      TAKE these medications       allopurinol 100 MG tablet  Commonly known as:  ZYLOPRIM  Take 200 mg by mouth daily.     amiodarone 200 MG tablet  Commonly known as:  PACERONE  Take 1 tablet (200 mg total) by mouth at bedtime.      apixaban 2.5 MG Tabs tablet  Commonly known as:  ELIQUIS  Take 1 tablet (2.5 mg total) by mouth 2 (two) times daily.     BL FLAX SEED OIL PO  Take 1 capsule by mouth 2 (two) times daily.     dutasteride 0.5 MG capsule  Commonly known as:  AVODART  Take 0.5 mg by mouth daily.     ferrous sulfate 325 (65 FE) MG tablet  Take 325 mg by mouth every other day.     fexofenadine 180 MG tablet  Commonly known as:  ALLEGRA  Take 180 mg by mouth daily.     hydrALAZINE 25 MG tablet  Commonly known as:  APRESOLINE  Take 25 mg by mouth every 12 (twelve) hours.     insulin glargine 100 UNIT/ML injection  Commonly known as:  LANTUS  Inject 12 Units into the skin at bedtime.     metoprolol tartrate 25 MG tablet  Commonly known as:  LOPRESSOR  Take 1 tablet (25 mg total) by mouth daily.     multivitamin with minerals Tabs tablet  Take 1 tablet by mouth daily. Diabetic pak by Petra Kuba from [facility]     nitroGLYCERIN 0.4 MG SL tablet  Commonly known as:  NITROSTAT  Place 1 tablet (0.4 mg total) under the tongue every 5 (five) minutes as needed for chest pain.     NOVOLOG FLEXPEN 100 UNIT/ML FlexPen  Generic drug:  insulin aspart  Inject 2-9 Units into the skin 3 (three) times daily with meals. Sliding scale     polyethylene glycol packet  Commonly known as:  MIRALAX / GLYCOLAX  Take 17 g by mouth 2 (two) times daily as needed for mild constipation. For constipation     potassium chloride SA 20 MEQ tablet  Commonly known as:  K-DUR,KLOR-CON  Take 1 tablet (20 mEq total) by mouth daily.     REFRESH OP  Place 1 drop into both eyes 2 (two) times daily. Refresh Gel Opth     silodosin 8 MG Caps capsule  Commonly known as:  RAPAFLO  Take 8 mg by mouth daily with breakfast.     SYSTANE 0.4-0.3 % Soln  Generic drug:  Polyethyl Glycol-Propyl Glycol  Place 1 drop into both eyes daily as needed (dry eyes). Into affected eyes     ZIOPTAN 0.0015 % Soln  Generic drug:  Tafluprost  Place  1 drop into both eyes at bedtime. Use at night       Follow-up Information   Follow up with [name], MD On 01/04/2014. (APPT @ 12;00 )    Specialty:  Internal Medicine   Contact information:   [address] [place] [address] [phone]       Follow up with [name], MD On 01/06/2014. (appointment at 9 am)    Specialty:  Cardiology   Contact information:   [address] [place] [address] [phone]  SignedBary Leriche 01/11/2014, 3:10 PM

## 2014-01-11 ENCOUNTER — Telehealth: Payer: Self-pay | Admitting: Interventional Cardiology

## 2014-01-11 NOTE — Telephone Encounter (Signed)
On 01/07/14 diuretics were held because of azotemia and a weight less than 185 pounds No diuretic was used until 01/10/2014 when 80 mg of torsemide was used in the a.m. despite 80 mg of torsemide the weight: 01/11/2014 had increased by a half pounds from 186.7 to 187.5 pounds. Order was given to use torsemide 80 mg a.m. and 40 mg p.m. going forward and call with daily weights.

## 2014-01-14 ENCOUNTER — Encounter: Payer: Self-pay | Admitting: Interventional Cardiology

## 2014-01-14 ENCOUNTER — Ambulatory Visit
Admission: RE | Admit: 2014-01-14 | Discharge: 2014-01-14 | Disposition: A | Discharge status: Home / Self Care | Payer: Medicare Other | Source: Ambulatory Visit | Attending: Orthopedic Surgery | Admitting: Orthopedic Surgery

## 2014-01-14 ENCOUNTER — Ambulatory Visit (INDEPENDENT_AMBULATORY_CARE_PROVIDER_SITE_OTHER): Payer: Medicare Other | Admitting: Interventional Cardiology

## 2014-01-14 VITALS — BP 116/60 | HR 64 | Ht 67.0 in | Wt 187.0 lb

## 2014-01-14 VITALS — BP 136/61

## 2014-01-14 DIAGNOSIS — I480 Paroxysmal atrial fibrillation: Secondary | ICD-10-CM

## 2014-01-14 DIAGNOSIS — Z7901 Long term (current) use of anticoagulants: Secondary | ICD-10-CM

## 2014-01-14 DIAGNOSIS — M48061 Spinal stenosis, lumbar region without neurogenic claudication: Secondary | ICD-10-CM

## 2014-01-14 DIAGNOSIS — N183 Chronic kidney disease, stage 3 unspecified: Secondary | ICD-10-CM

## 2014-01-14 DIAGNOSIS — N289 Disorder of kidney and ureter, unspecified: Secondary | ICD-10-CM

## 2014-01-14 DIAGNOSIS — I4891 Unspecified atrial fibrillation: Secondary | ICD-10-CM

## 2014-01-14 DIAGNOSIS — I209 Angina pectoris, unspecified: Secondary | ICD-10-CM

## 2014-01-14 DIAGNOSIS — I10 Essential (primary) hypertension: Secondary | ICD-10-CM

## 2014-01-14 DIAGNOSIS — I5033 Acute on chronic diastolic (congestive) heart failure: Secondary | ICD-10-CM

## 2014-01-14 MED ORDER — TORSEMIDE 20 MG PO TABS: ORAL_TABLET | ORAL | Status: DC

## 2014-01-14 MED ORDER — IOHEXOL 180 MG/ML  SOLN
1.0000 mL | Freq: Once | INTRAMUSCULAR | Status: AC | PRN
  Administered 2014-01-14: 1 mL via EPIDURAL

## 2014-01-14 MED ORDER — METHYLPREDNISOLONE ACETATE 40 MG/ML INJ SUSP (RADIOLOG
120.0000 mg | Freq: Once | INTRAMUSCULAR | Status: AC
  Administered 2014-01-14: 120 mg via EPIDURAL

## 2014-01-14 NOTE — Progress Notes (Signed)
Patient ID: [name], male   DOB: 1918/12/26, 80 y.o.   MRN: [id number]    [address], [address] [place], [place]  [address] Phone: [phone] Fax:  [phone]  Date:  01/14/2014   ID:  [name], DOB [birth year], MRN [id number]  PCP:  [name], MD   ASSESSMENT:  1. Acute on chronic diastolic heart failure, stable 2. Paroxysmal atrial fibrillation 3. Acute on chronic kidney disease, stage IV with most recent creatinine greater than 3.0 4. Hypertension 5. CAD, stable   PLAN:  1. Check basic metabolic panel today 2. Adjustment in diuretic regimen based upon laboratory data 3. Clinical followup with me in 2 weeks 4. They should call if this weight on his home scales is 187 or higher 5. Maintain 1500 cc fluid restriction   SUBJECTIVE: [name] is a 80 y.o. male who is doing well. He denies chest discomfort. He is maintaining the fluid restriction. We have made several adjustments in the diuretic regimen since discharge from the hospital. His creatinine went above 3. We held all diuretics for 48 hours and then resumed  torsemide at 80 mg per day. He continued to gain weight so we increased the torsemide back to 80 mg in the morning and 40 in the evening.  He does have an epidural spinal injection today for back and hip pain.   Wt Readings from Last 3 Encounters:  01/14/14 187 lb (84.823 kg)  01/06/14 185 lb 9.6 oz (84.188 kg)  01/02/14 189 lb 2.5 oz (85.8 kg)     Past Medical History  Diagnosis Date  . Diabetes mellitus   . Hyperlipidemia   . Coronary artery disease   . Spinal stenosis   . Pneumonia   . Pacemaker   . Anemia   . Carotid artery disease   . PVD (peripheral vascular disease)   . Hyponatremia     Chronic  . CVA (cerebral infarction)   . CKD (chronic kidney disease) stage 4, GFR 15-29 ml/min   . Long term (current) use of anticoagulants     No bleeding on Eliquis  . Dysrhythmia   . PSVT (paroxysmal supraventricular  tachycardia)   . PAF (paroxysmal atrial fibrillation)     Asymptomatic. On Eliquis.  . AV block, 3rd degree     With DDD St. Jude PM, initially placed in 1993 by Dr. Nils Pyle. Device upgrade 10/2002 to DDD, by Dr. Rollene Fare complicated by bleeding.  . Essential hypertension, benign   . CHF (congestive heart failure)   . Chronic diastolic congestive heart failure     ECHO 05/20/12 LVEF estimated by 2D at 55-60%  . Arthritis   . Shortness of breath     Current Outpatient Prescriptions  Medication Sig Dispense Refill  . allopurinol (ZYLOPRIM) 100 MG tablet Take 200 mg by mouth daily.       [name] amiodarone (PACERONE) 200 MG tablet Take 1 tablet (200 mg total) by mouth at bedtime.      [name] apixaban (ELIQUIS) 2.5 MG TABS tablet Take 1 tablet (2.5 mg total) by mouth 2 (two) times daily.  60 tablet  1  . dutasteride (AVODART) 0.5 MG capsule Take 0.5 mg by mouth daily.       . ferrous sulfate 325 (65 FE) MG tablet Take 325 mg by mouth every other day.       . fexofenadine (ALLEGRA) 180 MG tablet Take 180 mg by mouth daily.      . Flaxseed,  Linseed, (BL FLAX SEED OIL PO) Take 1 capsule by mouth 2 (two) times daily.      . hydrALAZINE (APRESOLINE) 25 MG tablet Take 25 mg by mouth every 12 (twelve) hours.       . insulin aspart (NOVOLOG FLEXPEN) 100 UNIT/ML FlexPen Inject 2-9 Units into the skin 3 (three) times daily with meals. Sliding scale      . insulin glargine (LANTUS) 100 UNIT/ML injection Inject 12 Units into the skin at bedtime.      . isosorbide mononitrate (IMDUR) 60 MG 24 hr tablet Take 60 mg by mouth daily.      . metoprolol tartrate (LOPRESSOR) 25 MG tablet Take 1 tablet (25 mg total) by mouth daily.  30 tablet  6  . Multiple Vitamin (MULTIVITAMIN WITH MINERALS) TABS Take 1 tablet by mouth daily. Diabetic pak by Petra Kuba from [employer]      . nitroGLYCERIN (NITROSTAT) 0.4 MG SL tablet Place 1 tablet (0.4 mg total) under the tongue every 5 (five) minutes as needed for chest pain.  25 tablet  3  .  Polyethyl Glycol-Propyl Glycol (SYSTANE) 0.4-0.3 % SOLN Place 1 drop into both eyes daily as needed (dry eyes). Into affected eyes      . polyethylene glycol (MIRALAX / GLYCOLAX) packet Take 17 g by mouth 2 (two) times daily as needed for mild constipation. For constipation      . Polyvinyl Alcohol-Povidone (REFRESH OP) Place 1 drop into both eyes 2 (two) times daily. Refresh Gel Opth      . potassium chloride SA (K-DUR,KLOR-CON) 20 MEQ tablet Take 1 tablet (20 mEq total) by mouth daily.  30 tablet  6  . silodosin (RAPAFLO) 8 MG CAPS capsule Take 8 mg by mouth daily with breakfast.      . Tafluprost (ZIOPTAN) 0.0015 % SOLN Place 1 drop into both eyes at bedtime. Use at night      . torsemide (DEMADEX) 20 MG tablet Take 80 mg by mouth 2 (two) times daily. Take 4 tablets in the morning and 2 tablets in the afternoon.       No current facility-administered medications for this visit.    Allergies:    Allergies  Allergen Reactions  . Aggrenox [Aspirin-Dipyridamole Er] Other (See Comments)    Dizziness  . Carvedilol Other (See Comments)    Dizziness  . Claritin [Loratadine] Other (See Comments)    Dizziness & drowsiness   . Mucinex [Guaifenesin Er] Other (See Comments)    Dizziness, drowsiness  . Plavix [Clopidogrel Bisulfate] Other (See Comments)    Dizziness  . Rapaflo [Silodosin] Other (See Comments)    Dizziness for about 30- 45  minutes  . Statins Other (See Comments)    rhabdomyolisis  . Travatan Z [Travoprost] Other (See Comments)    Eye irritation    Social History:  The patient  reports that he quit smoking about 55 years ago. His smoking use included Pipe. He has never used smokeless tobacco. He reports that he does not drink alcohol or use illicit drugs.   ROS:  Please see the history of present illness.   Appetite and weight are stable   All other systems reviewed and negative.   OBJECTIVE: VS:  BP 116/60  Pulse 64  Ht 5\' 7"  (1.702 m)  Wt 187 lb (84.823 kg)  BMI 29.28  kg/m2 Well nourished, well developed, in no acute distress, appears comfortable HEENT: normal Neck: JVD flat while sitting at 90. Carotid bruit absent  Cardiac:  normal S1, S2; RRR; no murmur Lungs:  clear to auscultation bilaterally, no wheezing, rhonchi or rales Abd: soft, nontender, no hepatomegaly Ext: Edema absent. Pulses 2+ Skin: warm and dry Neuro:  CNs 2-12 intact, no focal abnormalities noted  EKG:  Not repeated       Signed, Illene Labrador III, MD 01/14/2014 4:02 PM

## 2014-01-14 NOTE — Patient Instructions (Signed)
Your physician recommends that you continue on your current medications as directed. Please refer to the Current Medication list given to you today.  Lab Today: Bmet  Weigh daily. If your weight at home is 187lb or more please call the office. [phone]  Restrict your fluid intake to 1500cc daily  You have a follow up appointment scheduled on 01/29/14 @ 2:30pm

## 2014-01-14 NOTE — Discharge Instructions (Signed)

## 2014-01-15 ENCOUNTER — Telehealth: Payer: Self-pay

## 2014-01-15 LAB — BASIC METABOLIC PANEL
BUN: 37 mg/dL — ABNORMAL HIGH (ref 6–23)
CO2: 33 mEq/L — ABNORMAL HIGH (ref 19–32)
Calcium: 9.2 mg/dL (ref 8.4–10.5)
Chloride: 98 mEq/L (ref 96–112)
Creatinine, Ser: 2.5 mg/dL — ABNORMAL HIGH (ref 0.4–1.5)
GFR: 30.83 mL/min — ABNORMAL LOW (ref >60.00)
Glucose, Bld: 121 mg/dL — ABNORMAL HIGH (ref 70–99)
Potassium: 4 mEq/L (ref 3.5–5.1)
Sodium: 139 mEq/L (ref 135–145)

## 2014-01-15 NOTE — Telephone Encounter (Signed)
lmom.Kidney function improved back to near baseline. No change in therapy from that discussed at the office visit.

## 2014-01-15 NOTE — Telephone Encounter (Signed)
Message copied by Lamar Laundry on Fri Jan 15, 2014  3:22 PM ------      Message from: Daneen Schick      Created: Fri Jan 15, 2014  2:23 PM       Kidney function improved back to near baseline. No change in therapy from that discussed at the office visit. ------

## 2014-01-29 ENCOUNTER — Encounter: Payer: Self-pay | Admitting: Interventional Cardiology

## 2014-01-29 ENCOUNTER — Ambulatory Visit (INDEPENDENT_AMBULATORY_CARE_PROVIDER_SITE_OTHER): Payer: Medicare Other | Admitting: Interventional Cardiology

## 2014-01-29 VITALS — BP 128/62 | HR 68 | Ht 67.0 in | Wt 187.0 lb

## 2014-01-29 DIAGNOSIS — N183 Chronic kidney disease, stage 3 unspecified: Secondary | ICD-10-CM

## 2014-01-29 DIAGNOSIS — I4891 Unspecified atrial fibrillation: Secondary | ICD-10-CM

## 2014-01-29 DIAGNOSIS — I5032 Chronic diastolic (congestive) heart failure: Secondary | ICD-10-CM

## 2014-01-29 DIAGNOSIS — I209 Angina pectoris, unspecified: Secondary | ICD-10-CM

## 2014-01-29 DIAGNOSIS — I10 Essential (primary) hypertension: Secondary | ICD-10-CM

## 2014-01-29 DIAGNOSIS — I480 Paroxysmal atrial fibrillation: Secondary | ICD-10-CM

## 2014-01-29 DIAGNOSIS — Z7901 Long term (current) use of anticoagulants: Secondary | ICD-10-CM

## 2014-01-29 DIAGNOSIS — Z79899 Other long term (current) drug therapy: Secondary | ICD-10-CM

## 2014-01-29 NOTE — Patient Instructions (Signed)
Your physician recommends that you continue on your current medications as directed. Please refer to the Current Medication list given to you today.  Your physician recommends that you return for lab work on 02/15/14  You have a follow up appointment scheduled on 02/15/14 @8am 

## 2014-01-29 NOTE — Progress Notes (Signed)
Patient ID: [name], male   DOB: [birth year], 80 y.o.   MRN: [id number]    [address] [place], [place]  [address] Phone: [phone] Fax:  [phone]  Date:  01/29/2014   ID:  [name], DOB 1919-03-14, MRN [id number]  PCP:  [name], MD   ASSESSMENT:  1. Diastolic heart failure seems to be relatively stable. Weight is slightly above 187 pounds this morning on the patient's home scales. 2. Paroxysmal atrial fibrillation, asymptomatic 3. Hypertension, borderline elevated 4. Amiodarone therapy without evidence of toxicity 5. Chronic kidney disease  PLAN:  1. Since weight is slightly elevated today we will double the PM torsemide dose to bring his weight down. 2. Continue to wait daily 3. Call if which remained elevated above 187 pounds 3. Clinical followup in 2 weeks with a basic metabolic panel   SUBJECTIVE: [name] is a 80 y.o. male who is doing well denies angina and orthopnea. He's been strict with fluid intake. We have utilized a sliding scale for torsemide dosing to keep his weight of 187 pounds. He denies orthopnea and PND. No angina. No peripheral edema or syncope.   Wt Readings from Last 3 Encounters:  01/29/14 187 lb (84.823 kg)  01/14/14 187 lb (84.823 kg)  01/06/14 185 lb 9.6 oz (84.188 kg)     Past Medical History  Diagnosis Date  . Diabetes mellitus   . Hyperlipidemia   . Coronary artery disease   . Spinal stenosis   . Pneumonia   . Pacemaker   . Anemia   . Carotid artery disease   . PVD (peripheral vascular disease)   . Hyponatremia     Chronic  . CVA (cerebral infarction)   . CKD (chronic kidney disease) stage 4, GFR 15-29 ml/min   . Long term (current) use of anticoagulants     No bleeding on Eliquis  . Dysrhythmia   . PSVT (paroxysmal supraventricular tachycardia)   . PAF (paroxysmal atrial fibrillation)     Asymptomatic. On Eliquis.  . AV block, 3rd degree     With DDD St. Jude PM, initially placed in  1993 by Dr. Nils Pyle. Device upgrade 10/2002 to DDD, by Dr. Rollene Fare complicated by bleeding.  . Essential hypertension, benign   . CHF (congestive heart failure)   . Chronic diastolic congestive heart failure     ECHO 05/20/12 LVEF estimated by 2D at 55-60%  . Arthritis   . Shortness of breath     Current Outpatient Prescriptions  Medication Sig Dispense Refill  . allopurinol (ZYLOPRIM) 100 MG tablet Take 200 mg by mouth daily.       [name] amiodarone (PACERONE) 200 MG tablet Take 1 tablet (200 mg total) by mouth at bedtime.      [name] apixaban (ELIQUIS) 2.5 MG TABS tablet Take 1 tablet (2.5 mg total) by mouth 2 (two) times daily.  60 tablet  1  . dutasteride (AVODART) 0.5 MG capsule Take 0.5 mg by mouth daily.       . ferrous sulfate 325 (65 FE) MG tablet Take 325 mg by mouth every other day.       . fexofenadine (ALLEGRA) 180 MG tablet Take 180 mg by mouth daily.      . Flaxseed, Linseed, (BL FLAX SEED OIL PO) Take 1 capsule by mouth 2 (two) times daily.      . hydrALAZINE (APRESOLINE) 25 MG tablet Take 25 mg by mouth every 12 (twelve) hours.       [name]  insulin aspart (NOVOLOG FLEXPEN) 100 UNIT/ML FlexPen Inject 2-9 Units into the skin 3 (three) times daily with meals. Sliding scale      . insulin glargine (LANTUS) 100 UNIT/ML injection Inject 12 Units into the skin at bedtime.      . isosorbide mononitrate (IMDUR) 60 MG 24 hr tablet Take 60 mg by mouth daily.      . metoprolol tartrate (LOPRESSOR) 25 MG tablet Take 1 tablet (25 mg total) by mouth daily.  30 tablet  6  . Multiple Vitamin (MULTIVITAMIN WITH MINERALS) TABS Take 1 tablet by mouth daily. Diabetic pak by Petra Kuba from [employer]      . Polyethyl Glycol-Propyl Glycol (SYSTANE) 0.4-0.3 % SOLN Place 1 drop into both eyes daily as needed (dry eyes). Into affected eyes      . polyethylene glycol (MIRALAX / GLYCOLAX) packet Take 17 g by mouth 2 (two) times daily as needed for mild constipation. For constipation      . Polyvinyl Alcohol-Povidone  (REFRESH OP) Place 1 drop into both eyes 2 (two) times daily. Refresh Gel Opth      . potassium chloride SA (K-DUR,KLOR-CON) 20 MEQ tablet       . silodosin (RAPAFLO) 8 MG CAPS capsule Take 8 mg by mouth daily with breakfast.      . Tafluprost (ZIOPTAN) 0.0015 % SOLN Place 1 drop into both eyes at bedtime. Use at night      . torsemide (DEMADEX) 20 MG tablet Take 80 mg by mouth daily. Take 4 tablets(80mg ) in the morning. And two tablets in afternoon      . nitroGLYCERIN (NITROSTAT) 0.4 MG SL tablet Place 1 tablet (0.4 mg total) under the tongue every 5 (five) minutes as needed for chest pain.  25 tablet  3   No current facility-administered medications for this visit.    Allergies:    Allergies  Allergen Reactions  . Aggrenox [Aspirin-Dipyridamole Er] Other (See Comments)    Dizziness  . Carvedilol Other (See Comments)    Dizziness  . Claritin [Loratadine] Other (See Comments)    Dizziness & drowsiness   . Mucinex [Guaifenesin Er] Other (See Comments)    Dizziness, drowsiness  . Plavix [Clopidogrel Bisulfate] Other (See Comments)    Dizziness  . Rapaflo [Silodosin] Other (See Comments)    Dizziness for about 30- 45  minutes  . Statins Other (See Comments)    rhabdomyolisis  . Travatan Z [Travoprost] Other (See Comments)    Eye irritation    Social History:  The patient  reports that he quit smoking about 55 years ago. His smoking use included Pipe. He has never used smokeless tobacco. He reports that he does not drink alcohol or use illicit drugs.   ROS:  Please see the history of present illness.   Stable appetite. No blood in urine and stool   All other systems reviewed and negative.   OBJECTIVE: VS:  BP 128/62  Pulse 68  Ht 5\' 7"  (1.702 m)  Wt 187 lb (84.823 kg)  BMI 29.28 kg/m2 Well nourished, well developed, in no acute distress, alert oriented HEENT: normal Neck: JVD flat. Carotid bruit absent  Cardiac:  normal S1, S2; RRR; no murmur Lungs:  clear to auscultation  bilaterally, no wheezing, rhonchi or rales Abd: soft, nontender, no hepatomegaly Ext: Edema absent. Pulses 2+ Skin: warm and dry Neuro:  CNs 2-12 intact, no focal abnormalities noted  EKG:  Not performed       Signed, Illene Labrador  III, MD 01/29/2014 3:28 PM

## 2014-02-15 ENCOUNTER — Encounter: Payer: Self-pay | Admitting: Interventional Cardiology

## 2014-02-15 ENCOUNTER — Ambulatory Visit (INDEPENDENT_AMBULATORY_CARE_PROVIDER_SITE_OTHER): Payer: Medicare Other | Admitting: Interventional Cardiology

## 2014-02-15 VITALS — BP 178/68 | HR 67 | Ht 67.0 in | Wt 188.4 lb

## 2014-02-15 DIAGNOSIS — I5032 Chronic diastolic (congestive) heart failure: Secondary | ICD-10-CM

## 2014-02-15 DIAGNOSIS — Z23 Encounter for immunization: Secondary | ICD-10-CM

## 2014-02-15 DIAGNOSIS — N183 Chronic kidney disease, stage 3 unspecified: Secondary | ICD-10-CM

## 2014-02-15 DIAGNOSIS — E119 Type 2 diabetes mellitus without complications: Secondary | ICD-10-CM

## 2014-02-15 DIAGNOSIS — I10 Essential (primary) hypertension: Secondary | ICD-10-CM

## 2014-02-15 DIAGNOSIS — I209 Angina pectoris, unspecified: Secondary | ICD-10-CM

## 2014-02-15 DIAGNOSIS — Z7901 Long term (current) use of anticoagulants: Secondary | ICD-10-CM

## 2014-02-15 DIAGNOSIS — I480 Paroxysmal atrial fibrillation: Secondary | ICD-10-CM

## 2014-02-15 LAB — CBC WITH DIFFERENTIAL/PLATELET
BASOS PCT: 0.4 % (ref 0.0–3.0)
Basophils Absolute: 0 10*3/uL (ref 0.0–0.1)
EOS PCT: 1.1 % (ref 0.0–5.0)
Eosinophils Absolute: 0.1 10*3/uL (ref 0.0–0.7)
HCT: 37.8 % — ABNORMAL LOW (ref 39.0–52.0)
Hemoglobin: 12.3 g/dL — ABNORMAL LOW (ref 13.0–17.0)
LYMPHS PCT: 24.1 % (ref 12.0–46.0)
Lymphs Abs: 1.3 10*3/uL (ref 0.7–4.0)
MCHC: 32.4 g/dL (ref 30.0–36.0)
MCV: 102.7 fl — AB (ref 78.0–100.0)
MONO ABS: 0.5 10*3/uL (ref 0.1–1.0)
Monocytes Relative: 9.2 % (ref 3.0–12.0)
Neutro Abs: 3.6 10*3/uL (ref 1.4–7.7)
Neutrophils Relative %: 65.2 % (ref 43.0–77.0)
Platelets: 181 10*3/uL (ref 150.0–400.0)
RBC: 3.68 Mil/uL — AB (ref 4.22–5.81)
RDW: 17.7 % — ABNORMAL HIGH (ref 11.5–15.5)
WBC: 5.6 10*3/uL (ref 4.0–10.5)

## 2014-02-15 LAB — BASIC METABOLIC PANEL
BUN: 41 mg/dL — AB (ref 6–23)
CO2: 33 meq/L — AB (ref 19–32)
Calcium: 9.1 mg/dL (ref 8.4–10.5)
Chloride: 97 mEq/L (ref 96–112)
Creatinine, Ser: 2.8 mg/dL — ABNORMAL HIGH (ref 0.4–1.5)
GFR: 27.41 mL/min — AB (ref >60.00)
GLUCOSE: 173 mg/dL — AB (ref 70–99)
POTASSIUM: 4.8 meq/L (ref 3.5–5.1)
SODIUM: 139 meq/L (ref 135–145)

## 2014-02-15 LAB — HEMOGLOBIN A1C: Hgb A1c MFr Bld: 7.9 % — ABNORMAL HIGH (ref 4.6–6.5)

## 2014-02-15 NOTE — Patient Instructions (Addendum)
Your physician recommends that you continue on your current medications as directed. Please refer to the Current Medication list given to you today.  Lab Today:Bmet, Cbc, HgA1c today, Flu shot  You have a follow up appointment scheduled on 03/15/14 @8 :45 am

## 2014-02-15 NOTE — Progress Notes (Signed)
Patient ID: [name], male   DOB: 01-07-1919, 80 y.o.   MRN: [id number]    [address] [place], [place]  [address] Phone: [phone] Fax:  [phone]  Date:  02/15/2014   ID:  [name], DOB 05-29-1918, MRN [id number]  PCP:  [name], MD   ASSESSMENT:  1. Chronic diastolic heart failure, stable on strict fluid and medication regimen. Complains today about first and cramping. 2. Paroxysmal atrial fibrillation, stable 3. Hypertension, trending upward into the 240-973 systolic range 4. Chronic kidney disease III-IV, clinical status unknown currently  PLAN:  1. Basic metabolic panel, complete blood count, and hemoglobin A1c ordered 2. Increase hydralazine to 25 mg 3 times a day 3. Clinical followup in one month 4. Call if weight there is by mother 3 pounds at work. [employer] weight on his home scales is 187 pounds.    SUBJECTIVE: [name] is a 80 y.o. male who is doing well. Has not had syncope. He has had tremor in his right arm and cramping in legs and feet. He denies dyspnea. He is concerned as blood pressure is trending upward. He denies orthopnea and chest discomfort. He has not had syncope or other significant cardiac complaints. No recent falls or trauma on anticoagulation.   Wt Readings from Last 3 Encounters:  02/15/14 188 lb 6.4 oz (85.458 kg)  01/29/14 187 lb (84.823 kg)  01/14/14 187 lb (84.823 kg)     Past Medical History  Diagnosis Date  . Diabetes mellitus   . Hyperlipidemia   . Coronary artery disease   . Spinal stenosis   . Pneumonia   . Pacemaker   . Anemia   . Carotid artery disease   . PVD (peripheral vascular disease)   . Hyponatremia     Chronic  . CVA (cerebral infarction)   . CKD (chronic kidney disease) stage 4, GFR 15-29 ml/min   . Long term (current) use of anticoagulants     No bleeding on Eliquis  . Dysrhythmia   . PSVT (paroxysmal supraventricular tachycardia)   . PAF (paroxysmal atrial  fibrillation)     Asymptomatic. On Eliquis.  . AV block, 3rd degree     With DDD St. Jude PM, initially placed in 1993 by Dr. Nils Pyle. Device upgrade 10/2002 to DDD, by Dr. Rollene Fare complicated by bleeding.  . Essential hypertension, benign   . CHF (congestive heart failure)   . Chronic diastolic congestive heart failure     ECHO 05/20/12 LVEF estimated by 2D at 55-60%  . Arthritis   . Shortness of breath     Current Outpatient Prescriptions  Medication Sig Dispense Refill  . allopurinol (ZYLOPRIM) 100 MG tablet Take 200 mg by mouth daily.       [name] amiodarone (PACERONE) 200 MG tablet Take 1 tablet (200 mg total) by mouth at bedtime.      [name] apixaban (ELIQUIS) 2.5 MG TABS tablet Take 1 tablet (2.5 mg total) by mouth 2 (two) times daily.  60 tablet  1  . dutasteride (AVODART) 0.5 MG capsule Take 0.5 mg by mouth daily.       . ferrous sulfate 325 (65 FE) MG tablet Take 325 mg by mouth every other day.       . fexofenadine (ALLEGRA) 180 MG tablet Take 180 mg by mouth daily.      . Flaxseed, Linseed, (BL FLAX SEED OIL PO) Take 1 capsule by mouth 2 (two) times daily.      [name]  hydrALAZINE (APRESOLINE) 25 MG tablet Take 25 mg by mouth every 12 (twelve) hours.       . insulin aspart (NOVOLOG FLEXPEN) 100 UNIT/ML FlexPen Inject 2-9 Units into the skin 3 (three) times daily with meals. Sliding scale      . insulin glargine (LANTUS) 100 UNIT/ML injection Inject 12 Units into the skin at bedtime.      . isosorbide mononitrate (IMDUR) 60 MG 24 hr tablet Take 60 mg by mouth daily.      . metoprolol tartrate (LOPRESSOR) 25 MG tablet Take 1 tablet (25 mg total) by mouth daily.  30 tablet  6  . Multiple Vitamin (MULTIVITAMIN WITH MINERALS) TABS Take 1 tablet by mouth daily. Diabetic pak by Petra Kuba from [employer]      . nitroGLYCERIN (NITROSTAT) 0.4 MG SL tablet Place 1 tablet (0.4 mg total) under the tongue every 5 (five) minutes as needed for chest pain.  25 tablet  3  . Polyethyl Glycol-Propyl Glycol (SYSTANE)  0.4-0.3 % SOLN Place 1 drop into both eyes daily as needed (dry eyes). Into affected eyes      . polyethylene glycol (MIRALAX / GLYCOLAX) packet Take 17 g by mouth 2 (two) times daily as needed for mild constipation. For constipation      . Polyvinyl Alcohol-Povidone (REFRESH OP) Place 1 drop into both eyes 2 (two) times daily. Refresh Gel Opth      . potassium chloride SA (K-DUR,KLOR-CON) 20 MEQ tablet       . silodosin (RAPAFLO) 8 MG CAPS capsule Take 8 mg by mouth daily with breakfast.      . Tafluprost (ZIOPTAN) 0.0015 % SOLN Place 1 drop into both eyes at bedtime. Use at night      . torsemide (DEMADEX) 20 MG tablet Take 80 mg by mouth daily. Take 4 tablets(80mg ) in the morning. And two tablets in afternoon       No current facility-administered medications for this visit.    Allergies:    Allergies  Allergen Reactions  . Aggrenox [Aspirin-Dipyridamole Er] Other (See Comments)    Dizziness  . Carvedilol Other (See Comments)    Dizziness  . Claritin [Loratadine] Other (See Comments)    Dizziness & drowsiness   . Mucinex [Guaifenesin Er] Other (See Comments)    Dizziness, drowsiness  . Plavix [Clopidogrel Bisulfate] Other (See Comments)    Dizziness  . Rapaflo [Silodosin] Other (See Comments)    Dizziness for about 30- 45  minutes  . Statins Other (See Comments)    rhabdomyolisis  . Travatan Z [Travoprost] Other (See Comments)    Eye irritation    Social History:  The patient  reports that he quit smoking about 55 years ago. His smoking use included Pipe. He has never used smokeless tobacco. He reports that he does not drink alcohol or use illicit drugs.   ROS:  Please see the history of present illness.   No edema. Denies transient neurological symptoms.   All other systems reviewed and negative.   OBJECTIVE: VS:  BP 178/68  Pulse 67  Ht 5\' 7"  (1.702 m)  Wt 188 lb 6.4 oz (85.458 kg)  BMI 29.50 kg/m2 Well nourished, well developed, in no acute distress, elderly HEENT:  normal Neck: JVD flat. Carotid bruit absent  Cardiac:  normal S1, S2; RRR; no murmur Lungs:  clear to auscultation bilaterally, no wheezing, rhonchi or rales Abd: soft, nontender, no hepatomegaly Ext: Edema absent. Pulses 2+ bilateral Skin: warm and dry Neuro:  CNs 2-12  intact, no focal abnormalities noted  EKG:  Not performed       Signed, Illene Labrador III, MD 02/15/2014 8:20 AM

## 2014-02-18 ENCOUNTER — Telehealth: Payer: Self-pay

## 2014-02-18 NOTE — Telephone Encounter (Signed)
Message copied by Lamar Laundry on Thu Feb 18, 2014  2:15 PM ------      Message from: Daneen Schick      Created: Thu Feb 18, 2014  8:17 AM       Labs are stable. No change in therapy ------

## 2014-02-18 NOTE — Telephone Encounter (Signed)
pt aware of lab results.Labs are stable. No change in therapy.pt verbalized understanding.

## 2014-03-01 ENCOUNTER — Encounter: Payer: Self-pay | Admitting: Internal Medicine

## 2014-03-01 ENCOUNTER — Ambulatory Visit (INDEPENDENT_AMBULATORY_CARE_PROVIDER_SITE_OTHER): Payer: Medicare Other | Admitting: Internal Medicine

## 2014-03-01 VITALS — BP 110/68 | HR 68 | Ht 66.0 in | Wt 186.7 lb

## 2014-03-01 DIAGNOSIS — I110 Hypertensive heart disease with heart failure: Secondary | ICD-10-CM

## 2014-03-01 DIAGNOSIS — I509 Heart failure, unspecified: Secondary | ICD-10-CM

## 2014-03-01 DIAGNOSIS — Z950 Presence of cardiac pacemaker: Secondary | ICD-10-CM

## 2014-03-01 DIAGNOSIS — I480 Paroxysmal atrial fibrillation: Secondary | ICD-10-CM

## 2014-03-01 DIAGNOSIS — Z7901 Long term (current) use of anticoagulants: Secondary | ICD-10-CM

## 2014-03-01 DIAGNOSIS — I495 Sick sinus syndrome: Secondary | ICD-10-CM

## 2014-03-01 DIAGNOSIS — I209 Angina pectoris, unspecified: Secondary | ICD-10-CM

## 2014-03-01 DIAGNOSIS — N184 Chronic kidney disease, stage 4 (severe): Secondary | ICD-10-CM

## 2014-03-01 LAB — MDC_IDC_ENUM_SESS_TYPE_INCLINIC
Battery Remaining Longevity: 39.6 mo
Brady Statistic RA Percent Paced: 99.19 %
Brady Statistic RV Percent Paced: 3.9 %
Date Time Interrogation Session: 20151019123344
Implantable Pulse Generator Serial Number: 7151645
Lead Channel Impedance Value: 212.5 Ohm
Lead Channel Pacing Threshold Amplitude: 1 V
Lead Channel Pacing Threshold Amplitude: 1.5 V
Lead Channel Pacing Threshold Pulse Width: 0.8 ms
Lead Channel Sensing Intrinsic Amplitude: 1.9 mV
Lead Channel Setting Pacing Amplitude: 2.5 V
Lead Channel Setting Pacing Amplitude: 3 V
Lead Channel Setting Pacing Pulse Width: 0.6 ms
MDC IDC MSMT BATTERY VOLTAGE: 2.93 V
MDC IDC MSMT LEADCHNL RV IMPEDANCE VALUE: 487.5 Ohm
MDC IDC MSMT LEADCHNL RV PACING THRESHOLD AMPLITUDE: 1 V
MDC IDC MSMT LEADCHNL RV PACING THRESHOLD PULSEWIDTH: 0.6 ms
MDC IDC MSMT LEADCHNL RV PACING THRESHOLD PULSEWIDTH: 0.6 ms
MDC IDC MSMT LEADCHNL RV SENSING INTR AMPL: 12 mV
MDC IDC SET LEADCHNL RV SENSING SENSITIVITY: 2 mV

## 2014-03-01 NOTE — Progress Notes (Signed)
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  He underwent pacemaker procedure by Dr Rollene Fare in 2004 (records are not available).  He had generator change by Dr Melrose Nakayama 12/09/2009.  The patient reports doing very well since having a pacemaker implanted and remains very active despite his age.  He continues to work as a Immunologist.  He has occasional weakness.   Today, he  denies symptoms of palpitations, chest pain, shortness of breath, orthopnea, PND, lower extremity edema, dizziness, presyncope, syncope, or neurologic sequela.  The patientis tolerating medications without difficulties and is otherwise without complaint today.   Past Medical History  Diagnosis Date  . Diabetes mellitus   . Hyperlipidemia   . Coronary artery disease   . Spinal stenosis   . Pneumonia   . Anemia   . Carotid artery disease   . PVD (peripheral vascular disease)   . Hyponatremia     Chronic  . CVA (cerebral infarction)   . CKD (chronic kidney disease) stage 4, GFR 15-29 ml/min   . Long term (current) use of anticoagulants     No bleeding on Eliquis  . PAF (paroxysmal atrial fibrillation)     Asymptomatic. On Eliquis.  . Sick sinus syndrome     With DDD St. Jude PM, initially placed in 1993 by Dr. Nils Pyle. Device upgrade 10/2002 to DDD, by Dr. Rollene Fare complicated by bleeding.  [name] Hypertensive cardiovascular disease   . CHF (congestive heart failure)   . Chronic diastolic congestive heart failure     ECHO 05/20/12 LVEF estimated by 2D at 55-60%  . Arthritis   . Shortness of breath    Past Surgical History  Procedure Laterality Date  . Lumbar laminectomy  1995  . Back surgery    . Total knee arthroplasty Left   . Quadriceps tendon repair    . Cataract extraction    . Carotid endarterectomy    . Yag laser  application Right 05/24/7354    Procedure: YAG LASER CAPSULOTOMY OF RIGHT EYE;  Surgeon: [name], MD;  Location: [place];  Service: Ophthalmology;  Laterality: Right;  . Tonsillectomy    . Pacemaker insertion      DDD St. Jude PM, initially placed in 1993 by Dr. Nils Pyle. Device upgrade 10/2002 to DDD, by Dr. Rollene Fare complicated by bleeding.  . Carotid endarterectomy Right 2006  . Transurethral resection of prostate    . Cardioversion N/A 06/16/2013    Procedure: CARDIOVERSION BEDSIDE;  Surgeon: [name], MD;  Location: [facility];  Service: Cardiovascular;  Laterality: N/A;  . Pacemaker generator change  12/09/2009    SJM [facility] DR RF gen change by Dr [name]    History   Social History  . Marital Status: Widowed    Spouse Name: N/A    Number of Children: 7  . Years of Education: N/A   Occupational History  . MD    Social History Main Topics  . Smoking status: Former Smoker -- 2 years    Types: Pipe    Quit date: 05/14/1958  . Smokeless tobacco: Never Used  . Alcohol Use: No     Comment: Cocktails occasionally  . Drug Use: No  . Sexual Activity: Not Currently   Other Topics Concern  . Not on file  Social History Narrative   Has a caregiver that lives with him. Still works regularly as a Immunologist    Family History  Problem Relation Age of Onset  . Multiple myeloma Father   . Hypertension Mother     Allergies  Allergen Reactions  . Aggrenox [Aspirin-Dipyridamole Er] Other (See Comments)    Dizziness  . Carvedilol Other (See Comments)    Dizziness  . Claritin [Loratadine] Other (See Comments)    Dizziness & drowsiness   . Mucinex [Guaifenesin Er] Other (See Comments)    Dizziness, drowsiness  . Plavix [Clopidogrel Bisulfate] Other (See Comments)    Dizziness  . Rapaflo [Silodosin] Other (See Comments)    Dizziness for about 30- 45  minutes  . Statins Other (See Comments)    rhabdomyolisis  . Travatan Z [Travoprost] Other  (See Comments)    Eye irritation    Current Outpatient Prescriptions  Medication Sig Dispense Refill  . allopurinol (ZYLOPRIM) 100 MG tablet Take 200 mg by mouth daily.       [name] amiodarone (PACERONE) 200 MG tablet Take 1 tablet (200 mg total) by mouth at bedtime.      [name] apixaban (ELIQUIS) 2.5 MG TABS tablet Take 1 tablet (2.5 mg total) by mouth 2 (two) times daily.  60 tablet  1  . dutasteride (AVODART) 0.5 MG capsule Take 0.5 mg by mouth daily.       . ferrous sulfate 325 (65 FE) MG tablet Take 325 mg by mouth every other day.       . fexofenadine (ALLEGRA) 180 MG tablet Take 180 mg by mouth daily.      . Flaxseed, Linseed, (BL FLAX SEED OIL PO) Take 1 capsule by mouth 2 (two) times daily.      . hydrALAZINE (APRESOLINE) 25 MG tablet Take 25 mg by mouth every 12 (twelve) hours.       . insulin aspart (NOVOLOG FLEXPEN) 100 UNIT/ML FlexPen Inject 2-9 Units into the skin 3 (three) times daily with meals. Sliding scale      . insulin glargine (LANTUS) 100 UNIT/ML injection Inject 12 Units into the skin at bedtime.      . isosorbide mononitrate (IMDUR) 60 MG 24 hr tablet Take 60 mg by mouth daily.      . metoprolol tartrate (LOPRESSOR) 25 MG tablet Take 1 tablet (25 mg total) by mouth daily.  30 tablet  6  . Multiple Vitamin (MULTIVITAMIN WITH MINERALS) TABS Take 1 tablet by mouth daily. Diabetic pak by Petra Kuba from [employer]      . Polyethyl Glycol-Propyl Glycol (SYSTANE) 0.4-0.3 % SOLN Place 1 drop into both eyes daily as needed (dry eyes). Into affected eyes      . polyethylene glycol (MIRALAX / GLYCOLAX) packet Take 17 g by mouth 2 (two) times daily as needed for mild constipation. For constipation      . Polyvinyl Alcohol-Povidone (REFRESH OP) Place 1 drop into both eyes 2 (two) times daily. Refresh Gel Opth      . potassium chloride SA (K-DUR,KLOR-CON) 20 MEQ tablet Take 20 mEq by mouth daily.       . silodosin (RAPAFLO) 8 MG CAPS capsule Take 8 mg by mouth daily with breakfast.      .  Tafluprost (ZIOPTAN) 0.0015 % SOLN Place 1 drop into both eyes at bedtime. Use at night      . torsemide (DEMADEX) 20 MG tablet Take 4 tablets(6m) in the morning and three tablets (628m in afternoon      .  nitroGLYCERIN (NITROSTAT) 0.4 MG SL tablet Place 1 tablet (0.4 mg total) under the tongue every 5 (five) minutes as needed for chest pain.  25 tablet  3   No current facility-administered medications for this visit.    ROS- all systems are reviewed and negative except as per HPI  Physical Exam: Filed Vitals:   03/01/14 1146  BP: 110/68  Pulse: 68  Height: 5' 6" (1.676 m)  Weight: 186 lb 11.2 oz (84.687 kg)    GEN- The patient is well appearing, alert and oriented x 3 today.   Head- normocephalic, atraumatic Eyes-  Sclera clear, conjunctiva pink Ears- hearing intact Oropharynx- clear Neck- supple  Lungs- Clear to ausculation bilaterally, normal work of breathing Chest- pacemaker pocket is well healed Heart- Regular rate and rhythm, no murmurs, rubs or gallops, PMI not laterally displaced GI- soft, NT, ND, + BS Extremities- no clubbing, cyanosis, or edema Neuro- strength and sensation are intact  Pacemaker interrogation- reviewed in detail today,  See PACEART report ekg today reveals atrial pacing with PR 238 msec, IVCD, Qtc 469 msec  Assessment and Plan:  1. Sick sinus syndrome Normal pacemaker function See Pace Art report Atrial lead impedance (lead from 1993) is 200 Ohms today.  Threshold and sensing appear preserved.  Atrial threshold has increased slightly to 1.5V_0 .16mec.  I have reprogrammed atrial pacing to unipolar to preserve battery and for safety.  Continue to follow with Merlin.  Given his advanced age, we will try to avoid lead revision if able.  2. Hypertensive cardiovascular disease Stable No change required today  3. Atrial fibrillation Asymptomatic Dr STamala Julianto follow closely.  Once CrCl <30, would need to switch to coumadin as eliquis is  contraindicated for patients were CrCl <30 for afib management.  I suspect that we are getting close.  I will defer to Dr STamala [name]  4. CRI As above  Merlin remote monitoring I will see in 1 year in the device clinic

## 2014-03-01 NOTE — Patient Instructions (Signed)
Your physician wants you to follow-up in: 12 months with Dr Vallery Ridge will receive a reminder letter in the mail two months in advance. If you don't receive a letter, please call our office to schedule the follow-up appointment.   Remote monitoring is used to monitor your Pacemaker or ICD from home. This monitoring reduces the number of office visits required to check your device to one time per year. It allows [place] to keep an eye on the functioning of your device to ensure it is working properly. You are scheduled for a device check from home on 06/02/14. You may send your transmission at any time that day. If you have a wireless device, the transmission will be sent automatically. After your physician reviews your transmission, you will receive a postcard with your next transmission date.

## 2014-03-15 ENCOUNTER — Ambulatory Visit (INDEPENDENT_AMBULATORY_CARE_PROVIDER_SITE_OTHER): Payer: Medicare Other | Admitting: Interventional Cardiology

## 2014-03-15 ENCOUNTER — Encounter: Payer: Self-pay | Admitting: Interventional Cardiology

## 2014-03-15 VITALS — BP 160/80 | HR 62 | Ht 66.0 in | Wt 189.0 lb

## 2014-03-15 DIAGNOSIS — Z7901 Long term (current) use of anticoagulants: Secondary | ICD-10-CM

## 2014-03-15 DIAGNOSIS — I480 Paroxysmal atrial fibrillation: Secondary | ICD-10-CM

## 2014-03-15 DIAGNOSIS — I5032 Chronic diastolic (congestive) heart failure: Secondary | ICD-10-CM

## 2014-03-15 DIAGNOSIS — N183 Chronic kidney disease, stage 3 unspecified: Secondary | ICD-10-CM

## 2014-03-15 DIAGNOSIS — I495 Sick sinus syndrome: Secondary | ICD-10-CM

## 2014-03-15 DIAGNOSIS — I209 Angina pectoris, unspecified: Secondary | ICD-10-CM

## 2014-03-15 LAB — BASIC METABOLIC PANEL
BUN: 36 mg/dL — ABNORMAL HIGH (ref 6–23)
CALCIUM: 9.3 mg/dL (ref 8.4–10.5)
CHLORIDE: 97 meq/L (ref 96–112)
CO2: 30 meq/L (ref 19–32)
Creatinine, Ser: 2.5 mg/dL — ABNORMAL HIGH (ref 0.4–1.5)
GFR: 30.54 mL/min — ABNORMAL LOW (ref >60.00)
GLUCOSE: 225 mg/dL — AB (ref 70–99)
POTASSIUM: 4.4 meq/L (ref 3.5–5.1)
SODIUM: 137 meq/L (ref 135–145)

## 2014-03-15 NOTE — Progress Notes (Signed)
Patient ID: [name], male   DOB: 1918/08/14, 80 y.o.   MRN: [id number]    [address], [address], [place]  [address] Phone: [phone] Fax:  [phone]  Date:  03/15/2014   ID:  [name], DOB [birth year], MRN [id number]  PCP:  [name], MD   ASSESSMENT:  1. Patient dissatisfaction due to long wait in the waiting room 2. Chronic diastolic heart failure, stable 3. Paroxysmal atrial fibrillation with rhythm control on amiodarone 4. Coronary artery disease, stable 5. Chronic kidney disease, status unknown, creatinine 2.8 on last office visit 6. Amiodarone therapy  PLAN:  1. Basic metabolic panel today 2. Consider discontinuation of apixaban as creatinine clearance is significantly decreased 3. Clinical follow-up in one month with a repeat basic metabolic panel 4. No change in current medical regimen. We may decrease intensive diuretic regimen depending upon today's blood work   SUBJECTIVE: [name] is a 80 y.o. male overall feels fairly well. He is having some twitching in his right hand from time to time. He has cramping in his feet at night. He denies dyspnea. No chest discomfort. He has not had syncope. Legs feel weak and tired.  In looking at Dr. Jackalyn Lombard note. He'll properly raises a question of whether apixaban is still safe with the GFR hanging around 30 mL/m.   Wt Readings from Last 3 Encounters:  03/15/14 189 lb (85.73 kg)  03/01/14 186 lb 11.2 oz (84.687 kg)  02/15/14 188 lb 6.4 oz (85.458 kg)     Past Medical History  Diagnosis Date  . Diabetes mellitus   . Hyperlipidemia   . Coronary artery disease   . Spinal stenosis   . Pneumonia   . Anemia   . Carotid artery disease   . PVD (peripheral vascular disease)   . Hyponatremia     Chronic  . CVA (cerebral infarction)   . CKD (chronic kidney disease) stage 4, GFR 15-29 ml/min   . Long term (current) use of anticoagulants     No bleeding on Eliquis  . PAF (paroxysmal  atrial fibrillation)     Asymptomatic. On Eliquis.  . Sick sinus syndrome     With DDD St. Jude PM, initially placed in 1993 by Dr. Nils Pyle. Device upgrade 10/2002 to DDD, by Dr. Rollene Fare complicated by bleeding.  [name] Hypertensive cardiovascular disease   . CHF (congestive heart failure)   . Chronic diastolic congestive heart failure     ECHO 05/20/12 LVEF estimated by 2D at 55-60%  . Arthritis   . Shortness of breath     Current Outpatient Prescriptions  Medication Sig Dispense Refill  . allopurinol (ZYLOPRIM) 100 MG tablet Take 200 mg by mouth daily.     [name] amiodarone (PACERONE) 200 MG tablet Take 1 tablet (200 mg total) by mouth at bedtime.    [name] apixaban (ELIQUIS) 2.5 MG TABS tablet Take 1 tablet (2.5 mg total) by mouth 2 (two) times daily. 60 tablet 1  . dutasteride (AVODART) 0.5 MG capsule Take 0.5 mg by mouth daily.     . ferrous sulfate 325 (65 FE) MG tablet Take 325 mg by mouth every other day.     . fexofenadine (ALLEGRA) 180 MG tablet Take 180 mg by mouth daily.    . Flaxseed, Linseed, (BL FLAX SEED OIL PO) Take 1 capsule by mouth 2 (two) times daily.    . hydrALAZINE (APRESOLINE) 25 MG tablet Take 25 mg by mouth every 12 (twelve) hours.     [name]  insulin aspart (NOVOLOG FLEXPEN) 100 UNIT/ML FlexPen Inject 2-9 Units into the skin 3 (three) times daily with meals. Sliding scale    . insulin glargine (LANTUS) 100 UNIT/ML injection Inject 12 Units into the skin at bedtime.    . isosorbide mononitrate (IMDUR) 60 MG 24 hr tablet Take 60 mg by mouth daily.    . metoprolol tartrate (LOPRESSOR) 25 MG tablet Take 1 tablet (25 mg total) by mouth daily. 30 tablet 6  . Multiple Vitamin (MULTIVITAMIN WITH MINERALS) TABS Take 1 tablet by mouth daily. Diabetic pak by Petra Kuba from [employer]    . nitroGLYCERIN (NITROSTAT) 0.4 MG SL tablet Place 1 tablet (0.4 mg total) under the tongue every 5 (five) minutes as needed for chest pain. 25 tablet 3  . Polyethyl Glycol-Propyl Glycol (SYSTANE) 0.4-0.3 % SOLN Place  1 drop into both eyes daily as needed (dry eyes). Into affected eyes    . polyethylene glycol (MIRALAX / GLYCOLAX) packet Take 17 g by mouth 2 (two) times daily as needed for mild constipation. For constipation    . Polyvinyl Alcohol-Povidone (REFRESH OP) Place 1 drop into both eyes 2 (two) times daily. Refresh Gel Opth    . potassium chloride SA (K-DUR,KLOR-CON) 20 MEQ tablet Take 20 mEq by mouth daily.     . silodosin (RAPAFLO) 8 MG CAPS capsule Take 8 mg by mouth daily with breakfast.    . Tafluprost (ZIOPTAN) 0.0015 % SOLN Place 1 drop into both eyes at bedtime. Use at night    . torsemide (DEMADEX) 20 MG tablet Take 4 tablets(80mg ) in the morning and three tablets (60mg ) in afternoon     No current facility-administered medications for this visit.    Allergies:    Allergies  Allergen Reactions  . Aggrenox [Aspirin-Dipyridamole Er] Other (See Comments)    Dizziness  . Carvedilol Other (See Comments)    Dizziness  . Claritin [Loratadine] Other (See Comments)    Dizziness & drowsiness   . Mucinex [Guaifenesin Er] Other (See Comments)    Dizziness, drowsiness  . Plavix [Clopidogrel Bisulfate] Other (See Comments)    Dizziness  . Rapaflo [Silodosin] Other (See Comments)    Dizziness for about 30- 45  minutes  . Statins Other (See Comments)    rhabdomyolisis  . Travatan Z [Travoprost] Other (See Comments)    Eye irritation    Social History:  The patient  reports that he quit smoking about 55 years ago. His smoking use included Pipe. He has never used smokeless tobacco. He reports that he does not drink alcohol or use illicit drugs.   ROS:  Please see the history of present illness.   No transient neurological complaints. No blood in urine or stool.   All other systems reviewed and negative.   OBJECTIVE: VS:  BP 160/80 mmHg  Pulse 62  Ht 5\' 6"  (1.676 m)  Wt 189 lb (85.73 kg)  BMI 30.52 kg/m2  SpO2 97% Well nourished, well developed, in no acute distress, elderly in no  distress HEENT: normal Neck: JVD flat. Carotid bruit absent  Cardiac:  normal S1, S2; RRR; no murmur Lungs:  clear to auscultation bilaterally, no wheezing, rhonchi or rales Abd: soft, nontender, no hepatomegaly Ext: Edema trace bilateral. Pulses diminished Skin: warm and dry Neuro:  CNs 2-12 intact, no focal abnormalities noted.  EKG: not performed       Signed, Illene Labrador III, MD 03/15/2014 9:31 AM

## 2014-03-15 NOTE — Patient Instructions (Signed)
Your physician recommends that you continue on your current medications as directed. Please refer to the Current Medication list given to you today.  Lab Today: [facility]  Your physician recommends that you return for lab work on 04/16/14  You have a follow up appointment on 04/16/14 @ 8:45am

## 2014-03-18 ENCOUNTER — Other Ambulatory Visit: Payer: Self-pay | Admitting: Interventional Cardiology

## 2014-03-18 ENCOUNTER — Telehealth: Payer: Self-pay

## 2014-03-18 NOTE — Telephone Encounter (Signed)
Pt aware of lab results.Labs are improved. Stay on same therapy.pt verbalized understanding.

## 2014-03-18 NOTE — Telephone Encounter (Signed)
-----   Message from [name], MD sent at 03/18/2014  7:22 AM EST ----- Please notify

## 2014-03-31 ENCOUNTER — Ambulatory Visit (INDEPENDENT_AMBULATORY_CARE_PROVIDER_SITE_OTHER): Payer: Medicare Other | Admitting: Neurology

## 2014-03-31 ENCOUNTER — Encounter: Payer: Self-pay | Admitting: Neurology

## 2014-03-31 VITALS — BP 117/58 | HR 63 | Ht 67.0 in | Wt 189.0 lb

## 2014-03-31 DIAGNOSIS — R269 Unspecified abnormalities of gait and mobility: Secondary | ICD-10-CM | POA: Insufficient documentation

## 2014-03-31 DIAGNOSIS — I209 Angina pectoris, unspecified: Secondary | ICD-10-CM

## 2014-03-31 DIAGNOSIS — E1142 Type 2 diabetes mellitus with diabetic polyneuropathy: Secondary | ICD-10-CM

## 2014-03-31 DIAGNOSIS — G629 Polyneuropathy, unspecified: Secondary | ICD-10-CM

## 2014-03-31 MED ORDER — DULOXETINE HCL 20 MG PO CPEP: 20.0000 mg | ORAL_CAPSULE | Freq: Every day | ORAL | Status: DC

## 2014-03-31 NOTE — Progress Notes (Signed)
Reason for visit: gait disorder  [name] is a 80 y.o. 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 The patient has had significant lumbosacral spinal stenosis from the L3 through the L5 levels requiring 2 surgical decompressions. The patient has had a chronic gait disorder associated with bilateral foot drops, left greater than right. He also has had a left total knee replacement and a patellar tendon repair on the right. He indicates that he has felt increasing problems with walking over the last 6 months. The patient was in the hospital in August 2015 with congestive heart failure and worsening renal failure. The patient has undergone some rehabilitation since that time, and he continues to do some leg strengthening exercises. He reports some ongoing back pain that is worse in the morning when he first gets up out of bed. He feels better when he is up and moving. The patient also has noted some discomfort down the left leg to the ankle. He has numbness in both feet, and numbness up to the knee on the left. He denies any recent falls, the last fall he had was 5 years ago. The patient walks slow and deliberately using a walker. He comes to this office for an evaluation. He does have some problems with urinary retention, he denies problems controlling the bowels. He denies any issues with the neck or arms. He has undergone an epidural steroid injection recently without much benefit.  Past Medical History  Diagnosis Date  . Diabetes mellitus   . Hyperlipidemia   . Coronary artery disease   . Spinal stenosis   . Pneumonia   . Anemia   . Carotid artery disease   . PVD (peripheral vascular disease)   . Hyponatremia     Chronic  . CVA (cerebral infarction)   . CKD (chronic kidney disease) stage 4, GFR 15-29 ml/min   . Long term (current) use of anticoagulants     No  bleeding on Eliquis  . PAF (paroxysmal atrial fibrillation)     Asymptomatic. On Eliquis.  . Sick sinus syndrome     With DDD St. Jude PM, initially placed in 1993 by Dr. Nils Pyle. Device upgrade 10/2002 to DDD, by Dr. Rollene Fare complicated by bleeding.  [name] Hypertensive cardiovascular disease   . CHF (congestive heart failure)   . Chronic diastolic congestive heart failure     ECHO 05/20/12 LVEF estimated by 2D at 55-60%  . Arthritis   . Shortness of breath   . Gait disorder 03/31/2014  . Diabetic peripheral neuropathy associated with type 2 diabetes mellitus 03/31/2014    Past Surgical History  Procedure Laterality Date  . Lumbar laminectomy  1995  . Back surgery    . Total knee arthroplasty Left   . Quadriceps tendon repair    . Cataract extraction    . Carotid endarterectomy    . Yag laser application Right 0/02/2724    Procedure: YAG LASER CAPSULOTOMY OF RIGHT EYE;  Surgeon: [name], MD;  Location: [facility];  Service: Ophthalmology;  Laterality: Right;  . Tonsillectomy    . Pacemaker insertion      DDD St. Jude PM, initially placed in 1993 by Dr. Nils Pyle. Device upgrade 10/2002 to DDD, by Dr. Rollene Fare complicated by bleeding.  . Carotid endarterectomy Right 2006  . Transurethral resection of prostate    . Cardioversion N/A 06/16/2013  Procedure: CARDIOVERSION BEDSIDE;  Surgeon: [name], MD;  Location: [facility];  Service: Cardiovascular;  Laterality: N/A;  . Pacemaker generator change  12/09/2009    SJM [employer] DR RF gen change by Dr [name]    Family History  Problem Relation Age of Onset  . Multiple myeloma Father   . Hypertension Mother     Social history:  reports that he quit smoking about 55 years ago. His smoking use included Pipe. He has never used smokeless tobacco. He reports that he does not drink alcohol or use illicit drugs.  Medications:  Current Outpatient Prescriptions on File Prior to Visit  Medication Sig Dispense Refill  . allopurinol  (ZYLOPRIM) 100 MG tablet Take 200 mg by mouth daily.     [name] amiodarone (PACERONE) 200 MG tablet Take 1 tablet (200 mg total) by mouth at bedtime.    . dutasteride (AVODART) 0.5 MG capsule Take 0.5 mg by mouth daily.     [name] ELIQUIS 2.5 MG TABS tablet take 1 tablet by mouth twice a day 60 tablet 1  . ferrous sulfate 325 (65 FE) MG tablet Take 325 mg by mouth every other day.     . fexofenadine (ALLEGRA) 180 MG tablet Take 180 mg by mouth daily.    . Flaxseed, Linseed, (BL FLAX SEED OIL PO) Take 1 capsule by mouth 2 (two) times daily.    . hydrALAZINE (APRESOLINE) 25 MG tablet Take 25 mg by mouth every 12 (twelve) hours.     . insulin aspart (NOVOLOG FLEXPEN) 100 UNIT/ML FlexPen Inject 2-9 Units into the skin 3 (three) times daily with meals. Sliding scale    . insulin glargine (LANTUS) 100 UNIT/ML injection Inject 12 Units into the skin at bedtime.    . isosorbide mononitrate (IMDUR) 60 MG 24 hr tablet Take 60 mg by mouth daily.    . metoprolol tartrate (LOPRESSOR) 25 MG tablet Take 1 tablet (25 mg total) by mouth daily. 30 tablet 6  . Multiple Vitamin (MULTIVITAMIN WITH MINERALS) TABS Take 1 tablet by mouth daily. Diabetic pak by Petra Kuba from [employer]    . nitroGLYCERIN (NITROSTAT) 0.4 MG SL tablet Place 1 tablet (0.4 mg total) under the tongue every 5 (five) minutes as needed for chest pain. 25 tablet 3  . polyethylene glycol (MIRALAX / GLYCOLAX) packet Take 17 g by mouth 2 (two) times daily as needed for mild constipation. For constipation    . Polyvinyl Alcohol-Povidone (REFRESH OP) Place 1 drop into both eyes 2 (two) times daily. Refresh Gel Opth    . potassium chloride SA (K-DUR,KLOR-CON) 20 MEQ tablet Take 20 mEq by mouth daily.     . silodosin (RAPAFLO) 8 MG CAPS capsule Take 8 mg by mouth daily with breakfast.    . Tafluprost (ZIOPTAN) 0.0015 % SOLN Place 1 drop into both eyes at bedtime. Use at night    . torsemide (DEMADEX) 20 MG tablet Take 4 tablets(38m) in the morning and three tablets  (635m in afternoon     No current facility-administered medications on file prior to visit.      Allergies  Allergen Reactions  . Aggrenox [Aspirin-Dipyridamole Er] Other (See Comments)    Dizziness  . Carvedilol Other (See Comments)    Dizziness  . Claritin [Loratadine] Other (See Comments)    Dizziness & drowsiness   . Mucinex [Guaifenesin Er] Other (See Comments)    Dizziness, drowsiness  . Plavix [Clopidogrel Bisulfate] Other (See Comments)    Dizziness  . Rapaflo [  Silodosin] Other (See Comments)    Dizziness for about 30- 45  minutes  . Statins Other (See Comments)    rhabdomyolisis  . Travatan Z [Travoprost] Other (See Comments)    Eye irritation    ROS:  Out of a complete 14 system review of symptoms, the patient complains only of the following symptoms, and all other reviewed systems are negative.  Hearing loss Constipation Joint pain, muscle cramps, aching muscles Numbness in the feet Restless legs  Blood pressure 117/58, pulse 63, height '5\' 7"'  (1.702 m), weight 189 lb (85.73 kg).  Physical Exam  General: The patient is alert and cooperative at the time of the examination.  Eyes: Pupils are equal, round, and reactive to light. Discs are flat bilaterally.  Neck: The neck is supple, no carotid bruits are noted.  Respiratory: The respiratory examination is clear.  Cardiovascular: The cardiovascular examination reveals a regular rate and rhythm, no obvious murmurs or rubs are noted.  Skin: Extremities are with 2+ edema below the knees bilaterally.  Neurologic Exam  Mental status: The patient is alert and oriented x 3 at the time of the examination. The patient has apparent normal recent and remote memory, with an apparently normal attention span and concentration ability.  Cranial nerves: Facial symmetry is present. There is good sensation of the face to pinprick and soft touch bilaterally. The strength of the facial muscles and the muscles to head  turning and shoulder shrug are normal bilaterally. Speech is well enunciated, no aphasia or dysarthria is noted. Extraocular movements are full. Visual fields are full. The tongue is midline, and the patient has symmetric elevation of the soft palate. No obvious hearing deficits are noted.  Motor: The motor testing reveals 5 over 5 strength of all 4 extremities, with exception that he has bilateral foot drops, left greater than right and 4/5 strength proximally with hip flexion on the left. Good symmetric motor tone is noted throughout.  Sensory: Sensory testing is intact to pinprick, soft touch, vibration sensation, and position sense on the upper extremities. With the lower extremities, the patient has a stocking pattern pinprick sensory deficit across the ankles, and significant impairment of vibration and position sense in both feet. No evidence of extinction is noted.  Coordination: Cerebellar testing reveals good finger-nose-finger and heel-to-shin bilaterally.  Gait and station: Gait is wide-based, unsteady, the patient uses a walker for ambulation.Tandem gait was not attempted. Romberg is negative. No drift is seen.  Reflexes: Deep tendon reflexes are symmetric, but are depressed bilaterally. Toes are downgoing bilaterally.    CT lumbar 05/01/13:  IMPRESSION: Solid interbody fusion L2-3. Posterior decompression L2 through L5.  Multilevel spondylosis and facet hypertrophy throughout the lumbar spine causing foraminal encroachment at multiple levels. Overall no change from the myelogram of 11/30/2010    Assessment/Plan:  1. Gait disorder, multifactorial  2. Diabetes, diabetic peripheral neuropathy  3. Bilateral foot drops  4. Lumbosacral spondylosis  The patient has a multifactorial gait disorder associated with prior lumbosacral spinal stenosis. The patient may have some recent nerve root impingement with sciatica involving the left leg that has been present for about one  year. The patient also has a significant diabetic peripheral neuropathy, bilateral foot drops, and he has had a prior left total knee replacement, all of which may impair balance to some degree. The balance issue with walking is not treatable therefore. The patient is not a candidate for lumbosacral spine surgery even if a nerve root impingement is identified. He did  not respond to a previous epidural steroid injection. The patient will be placed on Cymbalta, he will continue leg strengthening exercises, and he will practice safety measures with walking. The patient will follow-up in 4 or 5 months. The Cymbalta can be increased slowly if it is found to be helpful.  Jill Alexanders MD 03/31/2014 6:44 PM  [facility] [address] [place], [place] [address]  Phone [phone] Fax [phone]

## 2014-03-31 NOTE — Patient Instructions (Signed)

## 2014-04-02 ENCOUNTER — Emergency Department (HOSPITAL_COMMUNITY): Payer: Medicare Other

## 2014-04-02 ENCOUNTER — Emergency Department (HOSPITAL_COMMUNITY)
Admission: EM | Admit: 2014-04-02 | Discharge: 2014-04-02 | Disposition: A | Discharge status: Home / Self Care | Payer: Medicare Other | Attending: Emergency Medicine | Admitting: Emergency Medicine

## 2014-04-02 ENCOUNTER — Encounter (HOSPITAL_COMMUNITY): Payer: Self-pay | Admitting: Emergency Medicine

## 2014-04-02 DIAGNOSIS — Z7901 Long term (current) use of anticoagulants: Secondary | ICD-10-CM | POA: Diagnosis not present

## 2014-04-02 DIAGNOSIS — I509 Heart failure, unspecified: Secondary | ICD-10-CM | POA: Insufficient documentation

## 2014-04-02 DIAGNOSIS — D649 Anemia, unspecified: Secondary | ICD-10-CM | POA: Insufficient documentation

## 2014-04-02 DIAGNOSIS — I2510 Atherosclerotic heart disease of native coronary artery without angina pectoris: Secondary | ICD-10-CM | POA: Insufficient documentation

## 2014-04-02 DIAGNOSIS — I739 Peripheral vascular disease, unspecified: Secondary | ICD-10-CM | POA: Insufficient documentation

## 2014-04-02 DIAGNOSIS — E1140 Type 2 diabetes mellitus with diabetic neuropathy, unspecified: Secondary | ICD-10-CM | POA: Insufficient documentation

## 2014-04-02 DIAGNOSIS — Z8701 Personal history of pneumonia (recurrent): Secondary | ICD-10-CM | POA: Insufficient documentation

## 2014-04-02 DIAGNOSIS — Z87891 Personal history of nicotine dependence: Secondary | ICD-10-CM | POA: Diagnosis not present

## 2014-04-02 DIAGNOSIS — R0602 Shortness of breath: Secondary | ICD-10-CM | POA: Insufficient documentation

## 2014-04-02 DIAGNOSIS — Z794 Long term (current) use of insulin: Secondary | ICD-10-CM | POA: Diagnosis not present

## 2014-04-02 DIAGNOSIS — Z79899 Other long term (current) drug therapy: Secondary | ICD-10-CM | POA: Diagnosis not present

## 2014-04-02 DIAGNOSIS — I1310 Hypertensive heart and chronic kidney disease without heart failure, with stage 1 through stage 4 chronic kidney disease, or unspecified chronic kidney disease: Secondary | ICD-10-CM | POA: Insufficient documentation

## 2014-04-02 DIAGNOSIS — N184 Chronic kidney disease, stage 4 (severe): Secondary | ICD-10-CM | POA: Diagnosis not present

## 2014-04-02 LAB — CBC WITH DIFFERENTIAL/PLATELET
Basophils Absolute: 0 10*3/uL (ref 0.0–0.1)
Basophils Relative: 0 % (ref 0–1)
EOS ABS: 0.2 10*3/uL (ref 0.0–0.7)
Eosinophils Relative: 3 % (ref 0–5)
HEMATOCRIT: 35.6 % — AB (ref 39.0–52.0)
HEMOGLOBIN: 11.4 g/dL — AB (ref 13.0–17.0)
LYMPHS ABS: 1.5 10*3/uL (ref 0.7–4.0)
Lymphocytes Relative: 27 % (ref 12–46)
MCH: 32.4 pg (ref 26.0–34.0)
MCHC: 32 g/dL (ref 30.0–36.0)
MCV: 101.1 fL — ABNORMAL HIGH (ref 78.0–100.0)
MONO ABS: 0.5 10*3/uL (ref 0.1–1.0)
MONOS PCT: 8 % (ref 3–12)
Neutro Abs: 3.5 10*3/uL (ref 1.7–7.7)
Neutrophils Relative %: 62 % (ref 43–77)
Platelets: 184 10*3/uL (ref 150–400)
RBC: 3.52 MIL/uL — AB (ref 4.22–5.81)
RDW: 15.9 % — ABNORMAL HIGH (ref 11.5–15.5)
WBC: 5.6 10*3/uL (ref 4.0–10.5)

## 2014-04-02 LAB — COMPREHENSIVE METABOLIC PANEL
ALK PHOS: 60 U/L (ref 39–117)
ALT: 54 U/L — ABNORMAL HIGH (ref 0–53)
AST: 55 U/L — ABNORMAL HIGH (ref 0–37)
Albumin: 3.3 g/dL — ABNORMAL LOW (ref 3.5–5.2)
Anion gap: 12 (ref 5–15)
BUN: 37 mg/dL — AB (ref 6–23)
CHLORIDE: 96 meq/L (ref 96–112)
CO2: 32 mEq/L (ref 19–32)
CREATININE: 2.41 mg/dL — AB (ref 0.50–1.35)
Calcium: 9.4 mg/dL (ref 8.4–10.5)
GFR calc non Af Amer: 22 mL/min — ABNORMAL LOW (ref >90)
GFR, EST AFRICAN AMERICAN: 25 mL/min — AB (ref >90)
Glucose, Bld: 110 mg/dL — ABNORMAL HIGH (ref 70–99)
POTASSIUM: 4.3 meq/L (ref 3.7–5.3)
Sodium: 140 mEq/L (ref 137–147)
TOTAL PROTEIN: 7 g/dL (ref 6.0–8.3)
Total Bilirubin: 0.4 mg/dL (ref 0.3–1.2)

## 2014-04-02 LAB — TROPONIN I: Troponin I: <0.3 ng/mL (ref <0.30)

## 2014-04-02 NOTE — ED Notes (Signed)
Pt. woke up this morning with SOB and chest tightness , history of CHF , denies cough / no fever or chills.

## 2014-04-02 NOTE — ED Notes (Signed)
Attempted IV x2, unsuccessful.  

## 2014-04-02 NOTE — ED Notes (Signed)
EDP at bedside  

## 2014-04-02 NOTE — ED Provider Notes (Signed)
CSN: [id number]     Arrival date & time 04/02/14  0440 History   First MD Initiated Contact with Patient 04/02/14 0447     Chief Complaint  Patient presents with  . Shortness of Breath      HPI Patient is a 80 year old male who presents to the emergency department with complaints of transient shortness of breath and diaphoresis that is now resolved.  Significant other reports the patient was sweating and this concerned her and thus he was brought to the ER for evaluation.  He denies chest pain at this time.  No fevers or chills.  No productive cough.  He does have a history of diastolic congestive heart failure.  He has had no change in his baseline weight.  His significant other weights and daily.  He's been compliant with his medications and his water restriction.  He does have a history of paroxysmal atrial fibrillation and is on anticoagulation.  Known coronary artery disease.   Past Medical History  Diagnosis Date  . Diabetes mellitus   . Hyperlipidemia   . Coronary artery disease   . Spinal stenosis   . Pneumonia   . Anemia   . Carotid artery disease   . PVD (peripheral vascular disease)   . Hyponatremia     Chronic  . CVA (cerebral infarction)   . CKD (chronic kidney disease) stage 4, GFR 15-29 ml/min   . Long term (current) use of anticoagulants     No bleeding on Eliquis  . PAF (paroxysmal atrial fibrillation)     Asymptomatic. On Eliquis.  . Sick sinus syndrome     With DDD St. Jude PM, initially placed in 1993 by Dr. Nils Pyle. Device upgrade 10/2002 to DDD, by Dr. Rollene Fare complicated by bleeding.  [name] Hypertensive cardiovascular disease   . CHF (congestive heart failure)   . Chronic diastolic congestive heart failure     ECHO 05/20/12 LVEF estimated by 2D at 55-60%  . Arthritis   . Shortness of breath   . Gait disorder 03/31/2014  . Diabetic peripheral neuropathy associated with type 2 diabetes mellitus 03/31/2014   Past Surgical History  Procedure Laterality Date   . Lumbar laminectomy  1995  . Back surgery    . Total knee arthroplasty Left   . Quadriceps tendon repair    . Cataract extraction    . Carotid endarterectomy    . Yag laser application Right 08/20/8117    Procedure: YAG LASER CAPSULOTOMY OF RIGHT EYE;  Surgeon: [name], MD;  Location: [place];  Service: Ophthalmology;  Laterality: Right;  . Tonsillectomy    . Pacemaker insertion      DDD St. Jude PM, initially placed in 1993 by Dr. Nils Pyle. Device upgrade 10/2002 to DDD, by Dr. Rollene Fare complicated by bleeding.  . Carotid endarterectomy Right 2006  . Transurethral resection of prostate    . Cardioversion N/A 06/16/2013    Procedure: CARDIOVERSION BEDSIDE;  Surgeon: [name], MD;  Location: [place];  Service: Cardiovascular;  Laterality: N/A;  . Pacemaker generator change  12/09/2009    SJM Accent DR RF gen change by Dr [name]   Family History  Problem Relation Age of Onset  . Multiple myeloma Father   . Hypertension Mother    History  Substance Use Topics  . Smoking status: Former Smoker -- 2 years    Types: Pipe    Quit date: 05/14/1958  . Smokeless tobacco: Never Used  . Alcohol Use: No  Comment: Cocktails occasionally    Review of Systems  All other systems reviewed and are negative.     Allergies  Aggrenox; Carvedilol; Claritin; Mucinex; Plavix; Rapaflo; Statins; and Travatan z  Home Medications   Prior to Admission medications   Medication Sig Start Date End Date Taking? Authorizing Provider  allopurinol (ZYLOPRIM) 100 MG tablet Take 200 mg by mouth daily.     Historical Provider, MD  amiodarone (PACERONE) 200 MG tablet Take 1 tablet (200 mg total) by mouth at bedtime. 01/01/14   Bary Leriche, PA-C  DULoxetine (CYMBALTA) 20 MG capsule Take 1 capsule (20 mg total) by mouth daily. 03/31/14   Kathrynn Ducking, MD  dutasteride (AVODART) 0.5 MG capsule Take 0.5 mg by mouth daily.     Historical Provider, MD  ELIQUIS 2.5 MG TABS tablet take 1  tablet by mouth twice a day 03/19/14   Belva Crome III, MD  ferrous sulfate 325 (65 FE) MG tablet Take 325 mg by mouth every other day.     Historical Provider, MD  fexofenadine (ALLEGRA) 180 MG tablet Take 180 mg by mouth daily.    Historical Provider, MD  Flaxseed, Linseed, (BL FLAX SEED OIL PO) Take 1 capsule by mouth 2 (two) times daily.    Historical Provider, MD  hydrALAZINE (APRESOLINE) 25 MG tablet Take 25 mg by mouth every 12 (twelve) hours.     Historical Provider, MD  insulin aspart (NOVOLOG FLEXPEN) 100 UNIT/ML FlexPen Inject 2-9 Units into the skin 3 (three) times daily with meals. Sliding scale    Historical Provider, MD  insulin glargine (LANTUS) 100 UNIT/ML injection Inject 12 Units into the skin at bedtime.    Historical Provider, MD  isosorbide mononitrate (IMDUR) 60 MG 24 hr tablet Take 60 mg by mouth daily. 01/01/14   Bary Leriche, PA-C  metoprolol tartrate (LOPRESSOR) 25 MG tablet Take 1 tablet (25 mg total) by mouth daily. 11/12/13   Belva Crome III, MD  Multiple Vitamin (MULTIVITAMIN WITH MINERALS) TABS Take 1 tablet by mouth daily. Diabetic pak by Petra Kuba from [place] Provider, MD  nitroGLYCERIN (NITROSTAT) 0.4 MG SL tablet Place 1 tablet (0.4 mg total) under the tongue every 5 (five) minutes as needed for chest pain. 12/03/13   Belva Crome III, MD  polyethylene glycol [facility] / Floria Raveling) packet Take 17 g by mouth 2 (two) times daily as needed for mild constipation. For constipation    Historical Provider, MD  Polyvinyl Alcohol-Povidone (REFRESH OP) Place 1 drop into both eyes 2 (two) times daily. Refresh Gel Opth    Historical Provider, MD  potassium chloride SA (K-DUR,KLOR-CON) 20 MEQ tablet Take 20 mEq by mouth daily.  01/15/14   Historical Provider, MD  silodosin (RAPAFLO) 8 MG CAPS capsule Take 8 mg by mouth daily with breakfast.    Historical Provider, MD  Tafluprost (ZIOPTAN) 0.0015 % SOLN Place 1 drop into both eyes at bedtime. Use at night    Historical  Provider, MD  torsemide (DEMADEX) 20 MG tablet Take 4 tablets(84m) in the morning and three tablets (667m in afternoon 01/14/14   HeBelva CromeII, MD   BP 136/79 mmHg  Pulse 75  Temp(Src) 97.5 F (36.4 C) (Oral)  Resp 19  Ht 5' 7" (1.702 m)  Wt 190 lb (86.183 kg)  BMI 29.75 kg/m2  SpO2 100% Physical Exam  Constitutional: He is oriented to person, place, and time. He appears well-developed and well-nourished.  HENT:  Head: Normocephalic and atraumatic.  Eyes: EOM are normal.  Neck: Normal range of motion.  Cardiovascular: Normal rate, regular rhythm, normal heart sounds and intact distal pulses.   Pulmonary/Chest: Effort normal and breath sounds normal. No respiratory distress.  Abdominal: Soft. He exhibits no distension. There is no tenderness.  Musculoskeletal: Normal range of motion. He exhibits no edema.  Neurological: He is alert and oriented to person, place, and time.  Skin: Skin is warm and dry.  Psychiatric: He has a normal mood and affect. Judgment normal.  Nursing note and vitals reviewed.   ED Course  Procedures (including critical care time) Labs Review Labs Reviewed  CBC WITH DIFFERENTIAL - Abnormal; Notable for the following:    RBC 3.52 (*)    Hemoglobin 11.4 (*)    HCT 35.6 (*)    MCV 101.1 (*)    RDW 15.9 (*)    All other components within normal limits  COMPREHENSIVE METABOLIC PANEL - Abnormal; Notable for the following:    Glucose, Bld 110 (*)    BUN 37 (*)    Creatinine, Ser 2.41 (*)    Albumin 3.3 (*)    AST 55 (*)    ALT 54 (*)    GFR calc non Af Amer 22 (*)    GFR calc Af Amer 25 (*)    All other components within normal limits  TROPONIN I   BUN  Date Value Ref Range Status  04/02/2014 37* 6 - 23 mg/dL Final  03/15/2014 36* 6 - 23 mg/dL Final  02/15/2014 41* 6 - 23 mg/dL Final  01/14/2014 37* 6 - 23 mg/dL Final   CREATININE, SER  Date Value Ref Range Status  04/02/2014 2.41* 0.50 - 1.35 mg/dL Final  03/15/2014 2.5* 0.4 - 1.5 mg/dL  Final  02/15/2014 2.8* 0.4 - 1.5 mg/dL Final  01/14/2014 2.5* 0.4 - 1.5 mg/dL Final       Imaging Review Dg Chest 2 View  04/02/2014   CLINICAL DATA:  Shortness of breath.  Chest tightness since 3 a.m.  EXAM: CHEST  2 VIEW  COMPARISON:  12/23/2013  FINDINGS: Heart size and pulmonary vascularity are normal. Mild linear infiltration or atelectasis in the lung bases. Small bilateral pleural effusions. Improving vascular congestion since previous study. Cardiac pacemaker. No pneumothorax.  IMPRESSION: Small pleural effusions with infiltration or atelectasis in the lung bases.   Electronically Signed   By: [name] M.D.   On: 04/02/2014 05:27     EKG Interpretation   Date/Time:  Friday April 02 2014 04:51:07 EST Ventricular Rate:  141 PR Interval:  238 QRS Duration: 53 QT Interval:  202 QTC Calculation: 309 R Axis:   111 Text Interpretation:  Electronic atrial pacemaker Low voltage, precordial  leads Probable right ventricular hypertrophy No significant change was  found Confirmed by CAMPOS  MD, KEVIN ([id number]) on 04/02/2014 6:41:57 AM      MDM   Final diagnoses:  SOB (shortness of breath)    7:40 AM Patient feels much better at this time.  He is ambulatory in the emergency department.  He's walking O2 saturation was 92%.  He has no increased work of breathing.  He feels great.  He would like to go home.  His chest x-ray is not significantly changed from before.  I do not believe the patient to be in florid heart failure.  His weights have not significantly changed.  I'm not sure why he transiently had shortness of breath and diaphoresis.  I don't  think he needs admission to the hospital this time.  His family will continue to observe him closely asked that he return to the ER for any new or worsening symptoms    Hoy Morn, MD 04/02/14 [id number]

## 2014-04-02 NOTE — ED Notes (Signed)
IV team at bedside 

## 2014-04-04 ENCOUNTER — Encounter (HOSPITAL_COMMUNITY): Payer: Self-pay | Admitting: *Deleted

## 2014-04-04 ENCOUNTER — Observation Stay (HOSPITAL_COMMUNITY)
Admission: EM | Admit: 2014-04-04 | Discharge: 2014-04-05 | Disposition: A | Discharge status: Home / Self Care | Payer: Medicare Other | Attending: Interventional Cardiology | Admitting: Interventional Cardiology

## 2014-04-04 ENCOUNTER — Emergency Department (HOSPITAL_COMMUNITY): Payer: Medicare Other

## 2014-04-04 DIAGNOSIS — E1142 Type 2 diabetes mellitus with diabetic polyneuropathy: Secondary | ICD-10-CM | POA: Insufficient documentation

## 2014-04-04 DIAGNOSIS — Z950 Presence of cardiac pacemaker: Secondary | ICD-10-CM | POA: Insufficient documentation

## 2014-04-04 DIAGNOSIS — I5032 Chronic diastolic (congestive) heart failure: Secondary | ICD-10-CM | POA: Diagnosis not present

## 2014-04-04 DIAGNOSIS — Z794 Long term (current) use of insulin: Secondary | ICD-10-CM | POA: Insufficient documentation

## 2014-04-04 DIAGNOSIS — R079 Chest pain, unspecified: Secondary | ICD-10-CM | POA: Insufficient documentation

## 2014-04-04 DIAGNOSIS — Z8673 Personal history of transient ischemic attack (TIA), and cerebral infarction without residual deficits: Secondary | ICD-10-CM | POA: Diagnosis not present

## 2014-04-04 DIAGNOSIS — E785 Hyperlipidemia, unspecified: Secondary | ICD-10-CM | POA: Diagnosis not present

## 2014-04-04 DIAGNOSIS — I129 Hypertensive chronic kidney disease with stage 1 through stage 4 chronic kidney disease, or unspecified chronic kidney disease: Secondary | ICD-10-CM | POA: Diagnosis not present

## 2014-04-04 DIAGNOSIS — I480 Paroxysmal atrial fibrillation: Secondary | ICD-10-CM | POA: Diagnosis not present

## 2014-04-04 DIAGNOSIS — I10 Essential (primary) hypertension: Secondary | ICD-10-CM | POA: Insufficient documentation

## 2014-04-04 DIAGNOSIS — I200 Unstable angina: Secondary | ICD-10-CM | POA: Diagnosis present

## 2014-04-04 DIAGNOSIS — E1121 Type 2 diabetes mellitus with diabetic nephropathy: Secondary | ICD-10-CM | POA: Insufficient documentation

## 2014-04-04 DIAGNOSIS — Z888 Allergy status to other drugs, medicaments and biological substances status: Secondary | ICD-10-CM | POA: Diagnosis not present

## 2014-04-04 DIAGNOSIS — I739 Peripheral vascular disease, unspecified: Secondary | ICD-10-CM | POA: Diagnosis not present

## 2014-04-04 DIAGNOSIS — N184 Chronic kidney disease, stage 4 (severe): Secondary | ICD-10-CM | POA: Insufficient documentation

## 2014-04-04 DIAGNOSIS — Z87891 Personal history of nicotine dependence: Secondary | ICD-10-CM | POA: Insufficient documentation

## 2014-04-04 DIAGNOSIS — R339 Retention of urine, unspecified: Secondary | ICD-10-CM | POA: Diagnosis not present

## 2014-04-04 DIAGNOSIS — I495 Sick sinus syndrome: Secondary | ICD-10-CM | POA: Insufficient documentation

## 2014-04-04 DIAGNOSIS — I25110 Atherosclerotic heart disease of native coronary artery with unstable angina pectoris: Principal | ICD-10-CM | POA: Insufficient documentation

## 2014-04-04 DIAGNOSIS — R0602 Shortness of breath: Secondary | ICD-10-CM | POA: Insufficient documentation

## 2014-04-04 DIAGNOSIS — Z7901 Long term (current) use of anticoagulants: Secondary | ICD-10-CM | POA: Diagnosis not present

## 2014-04-04 LAB — BASIC METABOLIC PANEL
ANION GAP: 15 (ref 5–15)
BUN: 37 mg/dL — ABNORMAL HIGH (ref 6–23)
CO2: 31 meq/L (ref 19–32)
Calcium: 9.5 mg/dL (ref 8.4–10.5)
Chloride: 95 mEq/L — ABNORMAL LOW (ref 96–112)
Creatinine, Ser: 2.52 mg/dL — ABNORMAL HIGH (ref 0.50–1.35)
GFR, EST AFRICAN AMERICAN: 24 mL/min — AB (ref >90)
GFR, EST NON AFRICAN AMERICAN: 20 mL/min — AB (ref >90)
Glucose, Bld: 174 mg/dL — ABNORMAL HIGH (ref 70–99)
Potassium: 4.3 mEq/L (ref 3.7–5.3)
SODIUM: 141 meq/L (ref 137–147)

## 2014-04-04 LAB — GLUCOSE, CAPILLARY
Glucose-Capillary: 233 mg/dL — ABNORMAL HIGH (ref 70–99)
Glucose-Capillary: 207 mg/dL — ABNORMAL HIGH (ref 70–99)

## 2014-04-04 LAB — PRO B NATRIURETIC PEPTIDE: PRO B NATRI PEPTIDE: 4874 pg/mL — AB (ref 0–450)

## 2014-04-04 LAB — CBC
HCT: 39.4 % (ref 39.0–52.0)
HEMOGLOBIN: 12.8 g/dL — AB (ref 13.0–17.0)
MCH: 33.7 pg (ref 26.0–34.0)
MCHC: 32.5 g/dL (ref 30.0–36.0)
MCV: 103.7 fL — ABNORMAL HIGH (ref 78.0–100.0)
PLATELETS: 199 10*3/uL (ref 150–400)
RBC: 3.8 MIL/uL — ABNORMAL LOW (ref 4.22–5.81)
RDW: 15.9 % — ABNORMAL HIGH (ref 11.5–15.5)
WBC: 6 10*3/uL (ref 4.0–10.5)

## 2014-04-04 LAB — HEMOGLOBIN A1C
Hgb A1c MFr Bld: 7.9 % — ABNORMAL HIGH (ref <5.7)
Mean Plasma Glucose: 180 mg/dL — ABNORMAL HIGH (ref <117)

## 2014-04-04 LAB — TROPONIN I
Troponin I: <0.3 ng/mL (ref <0.30)
Troponin I: <0.3 ng/mL (ref <0.30)
Troponin I: <0.3 ng/mL (ref <0.30)

## 2014-04-04 LAB — CBG MONITORING, ED: Glucose-Capillary: 163 mg/dL — ABNORMAL HIGH (ref 70–99)

## 2014-04-04 MED ORDER — APIXABAN 2.5 MG PO TABS
2.5000 mg | ORAL_TABLET | Freq: Two times a day (BID) | ORAL | Status: DC
  Administered 2014-04-04 – 2014-04-05 (×3): 2.5 mg via ORAL
  Filled 2014-04-04 (×4): qty 1

## 2014-04-04 MED ORDER — ONDANSETRON HCL 4 MG/2ML IJ SOLN: 4.0000 mg | Freq: Four times a day (QID) | INTRAMUSCULAR | Status: DC | PRN

## 2014-04-04 MED ORDER — FEXOFENADINE HCL 180 MG PO TABS
180.0000 mg | ORAL_TABLET | Freq: Every day | ORAL | Status: DC
  Administered 2014-04-04 – 2014-04-05 (×2): 180 mg via ORAL
  Filled 2014-04-04: qty 1

## 2014-04-04 MED ORDER — INSULIN ASPART 100 UNIT/ML ~~LOC~~ SOLN
0.0000 [IU] | Freq: Every day | SUBCUTANEOUS | Status: DC
  Administered 2014-04-04: 2 [IU] via SUBCUTANEOUS

## 2014-04-04 MED ORDER — NON FORMULARY: 180.0000 mg | Freq: Every day | Status: DC

## 2014-04-04 MED ORDER — NITROGLYCERIN 0.4 MG SL SUBL: 0.4000 mg | SUBLINGUAL_TABLET | SUBLINGUAL | Status: DC | PRN

## 2014-04-04 MED ORDER — ALLOPURINOL 100 MG PO TABS
200.0000 mg | ORAL_TABLET | Freq: Every day | ORAL | Status: DC
  Administered 2014-04-04 – 2014-04-05 (×2): 200 mg via ORAL
  Filled 2014-04-04 (×2): qty 2

## 2014-04-04 MED ORDER — FERROUS SULFATE 325 (65 FE) MG PO TABS
325.0000 mg | ORAL_TABLET | ORAL | Status: DC
  Administered 2014-04-05: 325 mg via ORAL
  Filled 2014-04-04: qty 1

## 2014-04-04 MED ORDER — HYDRALAZINE HCL 25 MG PO TABS
25.0000 mg | ORAL_TABLET | Freq: Two times a day (BID) | ORAL | Status: DC
  Administered 2014-04-04 – 2014-04-05 (×2): 25 mg via ORAL
  Filled 2014-04-04 (×3): qty 1

## 2014-04-04 MED ORDER — ISOSORBIDE MONONITRATE ER 60 MG PO TB24
60.0000 mg | ORAL_TABLET | Freq: Every day | ORAL | Status: DC
  Administered 2014-04-04 – 2014-04-05 (×2): 60 mg via ORAL
  Filled 2014-04-04 (×2): qty 1

## 2014-04-04 MED ORDER — INSULIN GLARGINE 100 UNIT/ML ~~LOC~~ SOLN: 9.0000 [IU] | Freq: Every day | SUBCUTANEOUS | Status: DC

## 2014-04-04 MED ORDER — DUTASTERIDE 0.5 MG PO CAPS
0.5000 mg | ORAL_CAPSULE | Freq: Every day | ORAL | Status: DC
  Administered 2014-04-04 – 2014-04-05 (×2): 0.5 mg via ORAL
  Filled 2014-04-04 (×2): qty 1

## 2014-04-04 MED ORDER — POTASSIUM CHLORIDE CRYS ER 20 MEQ PO TBCR
20.0000 meq | EXTENDED_RELEASE_TABLET | Freq: Every day | ORAL | Status: DC
Start: 2014-04-04 — End: 2014-04-05
  Administered 2014-04-04 – 2014-04-05 (×2): 20 meq via ORAL
  Filled 2014-04-04 (×2): qty 1

## 2014-04-04 MED ORDER — RANOLAZINE ER 500 MG PO TB12
500.0000 mg | ORAL_TABLET | Freq: Two times a day (BID) | ORAL | Status: DC
  Administered 2014-04-04 – 2014-04-05 (×3): 500 mg via ORAL
  Filled 2014-04-04 (×4): qty 1

## 2014-04-04 MED ORDER — POLYVINYL ALCOHOL 1.4 % OP SOLN
1.0000 [drp] | Freq: Two times a day (BID) | OPHTHALMIC | Status: DC
  Administered 2014-04-05 (×2): 1 [drp] via OPHTHALMIC
  Filled 2014-04-04: qty 15

## 2014-04-04 MED ORDER — ASPIRIN EC 81 MG PO TBEC
81.0000 mg | DELAYED_RELEASE_TABLET | Freq: Every day | ORAL | Status: DC
  Administered 2014-04-05: 81 mg via ORAL
  Filled 2014-04-04: qty 1

## 2014-04-04 MED ORDER — INSULIN GLARGINE 100 UNIT/ML ~~LOC~~ SOLN
9.0000 [IU] | Freq: Every day | SUBCUTANEOUS | Status: DC
  Administered 2014-04-04: 9 [IU] via SUBCUTANEOUS
  Filled 2014-04-04 (×2): qty 0.09

## 2014-04-04 MED ORDER — INSULIN ASPART 100 UNIT/ML ~~LOC~~ SOLN
0.0000 [IU] | Freq: Three times a day (TID) | SUBCUTANEOUS | Status: DC
  Administered 2014-04-04: 5 [IU] via SUBCUTANEOUS
  Administered 2014-04-05 (×2): 2 [IU] via SUBCUTANEOUS

## 2014-04-04 MED ORDER — NON FORMULARY: 8.0000 mg | Freq: Every day | Status: DC

## 2014-04-04 MED ORDER — DULOXETINE HCL 20 MG PO CPEP
20.0000 mg | ORAL_CAPSULE | Freq: Every day | ORAL | Status: DC
  Administered 2014-04-04 – 2014-04-05 (×2): 20 mg via ORAL
  Filled 2014-04-04 (×2): qty 1

## 2014-04-04 MED ORDER — ACETAMINOPHEN 325 MG PO TABS: 650.0000 mg | ORAL_TABLET | ORAL | Status: DC | PRN

## 2014-04-04 MED ORDER — AMIODARONE HCL 200 MG PO TABS
200.0000 mg | ORAL_TABLET | Freq: Every day | ORAL | Status: DC
  Administered 2014-04-04: 200 mg via ORAL
  Filled 2014-04-04 (×2): qty 1

## 2014-04-04 MED ORDER — METOPROLOL TARTRATE 25 MG PO TABS
25.0000 mg | ORAL_TABLET | Freq: Every day | ORAL | Status: DC
  Administered 2014-04-04 – 2014-04-05 (×2): 25 mg via ORAL
  Filled 2014-04-04 (×2): qty 1

## 2014-04-04 MED ORDER — ADULT MULTIVITAMIN W/MINERALS CH
1.0000 | ORAL_TABLET | Freq: Every day | ORAL | Status: DC
  Administered 2014-04-04 – 2014-04-05 (×2): 1 via ORAL
  Filled 2014-04-04 (×2): qty 1

## 2014-04-04 MED ORDER — TORSEMIDE 20 MG PO TABS
80.0000 mg | ORAL_TABLET | Freq: Two times a day (BID) | ORAL | Status: DC
  Administered 2014-04-04 – 2014-04-05 (×3): 80 mg via ORAL
  Filled 2014-04-04 (×4): qty 4

## 2014-04-04 MED ORDER — CARBOXYMETHYLCELLULOSE SODIUM 1 % OP SOLN: 1.0000 [drp] | Freq: Two times a day (BID) | OPHTHALMIC | Status: DC

## 2014-04-04 MED ORDER — INSULIN ASPART 100 UNIT/ML FLEXPEN: 2.0000 [IU] | PEN_INJECTOR | Freq: Three times a day (TID) | SUBCUTANEOUS | Status: DC

## 2014-04-04 MED ORDER — POLYETHYLENE GLYCOL 3350 17 G PO PACK
17.0000 g | PACK | Freq: Two times a day (BID) | ORAL | Status: DC | PRN
  Administered 2014-04-04: 17 g via ORAL
  Filled 2014-04-04 (×2): qty 1

## 2014-04-04 MED ORDER — SILODOSIN 8 MG PO CAPS
8.0000 mg | ORAL_CAPSULE | Freq: Every day | ORAL | Status: DC
  Administered 2014-04-04: 8 mg via ORAL
  Filled 2014-04-04: qty 1

## 2014-04-04 MED ORDER — LATANOPROST 0.005 % OP SOLN
1.0000 [drp] | Freq: Every day | OPHTHALMIC | Status: DC
  Filled 2014-04-04: qty 2.5

## 2014-04-04 NOTE — ED Notes (Signed)
Pt repositioned in bed, given pillow

## 2014-04-04 NOTE — ED Notes (Signed)
Patient transported to X-ray 

## 2014-04-04 NOTE — H&P (Signed)
History and Physical   Admit date: 04/04/2014 Name:  [name] Medical record number: [id number] DOB/Age:  01-05-1919  80 y.o. male   Referring: [facility]  Primary Cardiologist:  [name]   Chief complaint/reason for admission:  chest pain  HISTORY:   This 80 year old male.  Physician has a history of diabetes mellitus with nephropathy.  He has a history of significant vascular disease with carotid artery disease, some PVD, and angina.  He has stage IV chronic kidney disease and has had frequent emergency room visits with chest pain as well as diastolic heart failure.  He has had frequent episodes of falls.  His last admission was in August of this year.  He was in the emergency room with tightness in worsening shortness of breath 2 days ago and then began to have midsternal chest pain yesterday and again today and was brought to the emergency room having had chest discomfort occurring at rest this morning.  It was relieved with nitroglycerin.  He had another episode last night and is currently admitted at this time for evaluation of unstable angina pectoris.  He also has significant dizziness and has a significant gait disorder.  He was started by Dr. Jannifer Franklin on Cymbalta just several days ago.      Past Medical History  Diagnosis Date  . Diabetes mellitus   . Hyperlipidemia   . Coronary artery disease   . Spinal stenosis   . Pneumonia   . Anemia   . Carotid artery disease   . PVD (peripheral vascular disease)   . Hyponatremia     Chronic  . CVA (cerebral infarction)   . CKD (chronic kidney disease) stage 4, GFR 15-29 ml/min   . Long term (current) use of anticoagulants     No bleeding on Eliquis  . PAF (paroxysmal atrial fibrillation)     Asymptomatic. On Eliquis.  . Sick sinus syndrome     With DDD St. Jude PM, initially placed in 1993 by Dr. Nils Pyle. Device upgrade 10/2002 to DDD, by Dr. Rollene Fare complicated by bleeding.  [name] Hypertensive cardiovascular  disease   . CHF (congestive heart failure)   . Chronic diastolic congestive heart failure     ECHO 05/20/12 LVEF estimated by 2D at 55-60%  . Arthritis   . Shortness of breath   . Gait disorder 03/31/2014  . Diabetic peripheral neuropathy associated with type 2 diabetes mellitus 03/31/2014     Past Surgical History  Procedure Laterality Date  . Lumbar laminectomy  1995  . Back surgery    . Total knee arthroplasty Left   . Quadriceps tendon repair    . Cataract extraction    . Carotid endarterectomy    . Yag laser application Right 2/95/1884    Procedure: YAG LASER CAPSULOTOMY OF RIGHT EYE;  Surgeon: [name], MD;  Location: [place];  Service: Ophthalmology;  Laterality: Right;  . Tonsillectomy    . Pacemaker insertion      DDD St. Jude PM, initially placed in 1993 by Dr. Nils Pyle. Device upgrade 10/2002 to DDD, by Dr. Rollene Fare complicated by bleeding.  . Carotid endarterectomy Right 2006  . Transurethral resection of prostate    . Cardioversion N/A 06/16/2013    Procedure: CARDIOVERSION BEDSIDE;  Surgeon: [name], MD;  Location: [place];  Service: Cardiovascular;  Laterality: N/A;  . Pacemaker generator change  12/09/2009    SJM Accent DR RF gen change by Dr [name]  Allergies: is allergic to aggrenox; carvedilol; claritin; mucinex; plavix; rapaflo; statins; and travatan z.   Medications: Prior to Admission medications   Medication Sig Start Date End Date Taking? Authorizing Provider  allopurinol (ZYLOPRIM) 100 MG tablet Take 200 mg by mouth daily.    Yes Historical Provider, MD  amiodarone (PACERONE) 200 MG tablet Take 1 tablet (200 mg total) by mouth at bedtime. 01/01/14  Yes Ivan Anchors Love, PA-C  apixaban (ELIQUIS) 2.5 MG TABS tablet Take 2.5 mg by mouth 2 (two) times daily.   Yes Historical Provider, MD  DULoxetine (CYMBALTA) 20 MG capsule Take 1 capsule (20 mg total) by mouth daily. 03/31/14  Yes Kathrynn Ducking, MD  dutasteride (AVODART) 0.5 MG capsule Take  0.5 mg by mouth daily.    Yes Historical Provider, MD  ELIQUIS 2.5 MG TABS tablet take 1 tablet by mouth twice a day 03/19/14  Yes Belva Crome III, MD  ferrous sulfate 325 (65 FE) MG tablet Take 325 mg by mouth every other day.    Yes Historical Provider, MD  fexofenadine (ALLEGRA) 180 MG tablet Take 180 mg by mouth daily.   Yes Historical Provider, MD  Flaxseed, Linseed, (BL FLAX SEED OIL PO) Take 1 capsule by mouth 2 (two) times daily.   Yes Historical Provider, MD  hydrALAZINE (APRESOLINE) 25 MG tablet Take 25 mg by mouth every 12 (twelve) hours.    Yes Historical Provider, MD  insulin aspart (NOVOLOG FLEXPEN) 100 UNIT/ML FlexPen Inject 2-9 Units into the skin 3 (three) times daily with meals. Sliding scale   Yes Historical Provider, MD  insulin glargine (LANTUS) 100 UNIT/ML injection Inject 9-10 Units into the skin at bedtime.    Yes Historical Provider, MD  isosorbide mononitrate (IMDUR) 60 MG 24 hr tablet Take 60 mg by mouth daily. 01/01/14  Yes Ivan Anchors Love, PA-C  metoprolol tartrate (LOPRESSOR) 25 MG tablet Take 1 tablet (25 mg total) by mouth daily. 11/12/13  Yes [name], MD  Multiple Vitamin (MULTIVITAMIN WITH MINERALS) TABS Take 1 tablet by mouth daily. Diabetic pak by Petra Kuba from [facility]   Yes Historical Provider, MD  nitroGLYCERIN (NITROSTAT) 0.4 MG SL tablet Place 1 tablet (0.4 mg total) under the tongue every 5 (five) minutes as needed for chest pain. 12/03/13  Yes Belva Crome III, MD  polyethylene glycol [facility] / Floria Raveling) packet Take 17 g by mouth 2 (two) times daily as needed for mild constipation. For constipation   Yes Historical Provider, MD  Polyvinyl Alcohol-Povidone (REFRESH OP) Place 1 drop into both eyes 2 (two) times daily. Refresh Gel Opth   Yes Historical Provider, MD  potassium chloride SA (K-DUR,KLOR-CON) 20 MEQ tablet Take 20 mEq by mouth daily.  01/15/14  Yes Historical Provider, MD  silodosin (RAPAFLO) 8 MG CAPS capsule Take 8 mg by mouth daily with  breakfast.   Yes Historical Provider, MD  Tafluprost (ZIOPTAN) 0.0015 % SOLN Place 1 drop into both eyes at bedtime. Use at night   Yes Historical Provider, MD  torsemide (DEMADEX) 20 MG tablet Take 4 tablets(80mg ) in the morning and three tablets (60mg ) in afternoon 01/14/14  Yes [name], MD    Family History:  Family Status  Relation Status Death Age  . Father Deceased 49  . Mother Deceased 7  . Sister Alive   . Sister Alive   . Sister Alive     Social History:   reports that he quit smoking about 55 years ago. His  smoking use included Pipe. He has never used smokeless tobacco. He reports that he does not drink alcohol or use illicit drugs.   History   Social History Narrative   Has a caregiver that lives with him. Still works regularly as a Immunologist     Review of Systems: Complains of numbness involving his feet.  He has been weak and has fallen previously.  He has a history of atrial fibrillation but is maintaining sinus rhythm.  Has occasional indigestion.  He is bothered by the catheter that is currently in place. Other than as noted above, the remainder of the review of systems is normal  Physical Exam: BP 152/69 mmHg  Pulse 62  Temp(Src) 98.5 F (36.9 C) (Oral)  Resp 19  SpO2 97% General appearance: He is an elderly, pleasant male currently in no acute distress Head: Normocephalic, without obvious abnormality, atraumatic Eyes: conjunctivae/corneas clear. PERRL, EOM's intact. Fundi not examined: no adenopathy, no carotid bruit, supple, symmetrical, trachea midline and Jugular venous pressure difficult to assess due to his neck size Lungs: Reduced breath sounds in the bases Heart: Regular rhythm, normal S1, S2, no S3 Abdomen: soft, non-tender; bowel sounds normal; no masses,  no organomegaly Rectal: deferred Extremities: 1+ edema noted  Pulses: 2+ and symmetric Skin: Skin color, texture, turgor normal. No rashes or lesions Neurologic: gait  not formally tested, some numbness of his lower extremities.    Labs: CBC  Recent Labs  04/02/14 0502 04/04/14 0742  WBC 5.6 6.0  RBC 3.52* 3.80*  HGB 11.4* 12.8*  HCT 35.6* 39.4  PLT 184 199  MCV 101.1* 103.7*  MCH 32.4 33.7  MCHC 32.0 32.5  RDW 15.9* 15.9*  LYMPHSABS 1.5  --   MONOABS 0.5  --   EOSABS 0.2  --   BASOSABS 0.0  --    CMP   Recent Labs  04/02/14 0502 04/04/14 0742  NA 140 141  K 4.3 4.3  CL 96 95*  CO2 32 31  GLUCOSE 110* 174*  BUN 37* 37*  CREATININE 2.41* 2.52*  CALCIUM 9.4 9.5  PROT 7.0  --   ALBUMIN 3.3*  --   AST 55*  --   ALT 54*  --   ALKPHOS 60  --   BILITOT 0.4  --   GFRNONAA 22* 20*  GFRAA 25* 24*   BNP (last 3 results)  Recent Labs  12/22/13 1504 12/23/13 0535 04/04/14 0742  PROBNP 3984.0* 4564.0* 4874.0*   Cardiac Panel (last 3 results)  Recent Labs  04/02/14 0502 04/04/14 0742  TROPONINI <0.30 <0.30   Thyroid  Lab Results  Component Value Date   TSH 1.294 06/14/2013    EKG: Atrial paced, ventricular sensed rhythm, nonspecific ST changes on the ventricular complex  Radiology: Cardiomegaly, interstitial congestion   IMPRESSIONS: 1.  Unstable angina pectoris. 2.  Chronic diastolic congestive heart failure. 3.  Chronic kidney disease stage IV. 4.  Urinary retention. 5.  Diabetes mellitus with peripheral neuropathy   PLAN: I'm going to go ahead and put him in the hospital for observation for unstable angina pectoris and to cycle enzymes.  I think at his age that options going forward are limited.  He was just in the emergency room 2 days ago and although he wants to go home.  I think it would be more prudent to watch him here.    Signed: Kerry Hough MD [facility] Cardiology  04/04/2014, 12:16 PM

## 2014-04-04 NOTE — Progress Notes (Signed)
Family stated foley needs to be today. Pt arrived with foley which was placed for urinary retention. Spoke to [employer], [place] orders to remove foley.

## 2014-04-04 NOTE — Plan of Care (Signed)
Problem: Consults Goal: Chest Pain Patient Education (See Patient Education module for education specifics.)  Outcome: Progressing Goal: Diabetes Guidelines if Diabetic/Glucose > 140 If diabetic or lab glucose is > 140 mg/dl - Initiate Diabetes/Hyperglycemia Guidelines & Document Interventions  Outcome: Progressing  Problem: Consults Goal: Chest Pain Patient Education (See Patient Education module for education specifics.)  Outcome: Progressing Goal: Skin Care Protocol Initiated - if Braden Score 18 or less If consults are not indicated, leave blank or document N/A  Outcome: Progressing Goal: Tobacco Cessation referral if indicated Outcome: Progressing Goal: Nutrition Consult-if indicated Outcome: Progressing Goal: Diabetes Guidelines if Diabetic/Glucose > 140 If diabetic or lab glucose is > 140 mg/dl - Initiate Diabetes/Hyperglycemia Guidelines & Document Interventions  Outcome: Progressing  Problem: Phase I Progression Outcomes Goal: Hemodynamically stable Outcome: Progressing Goal: Anginal pain relieved Outcome: Progressing Goal: Aspirin unless contraindicated Outcome: Progressing

## 2014-04-04 NOTE — ED Notes (Signed)
IV team at bedside 

## 2014-04-04 NOTE — ED Notes (Signed)
Attempted IV start X1, unsuccessful. Was able to collect labs. Will page IV team.

## 2014-04-04 NOTE — ED Notes (Signed)
Spoke with chris, pa with cardiology, he is aware pt doesn't have iv access and is ok with that. 3E notified as well.

## 2014-04-04 NOTE — ED Notes (Signed)
Dr Wynonia Lawman at bedside to eval pt

## 2014-04-04 NOTE — ED Notes (Signed)
Pt reports waking up this am with mid chest pressure. Took 2 nitro pta which relieved his pain.

## 2014-04-04 NOTE — ED Provider Notes (Signed)
CSN: [id number]     Arrival date & time 04/04/14  0709 History   First MD Initiated Contact with Patient 04/04/14 [phone]     Chief Complaint  Patient presents with  . Chest Pain     (Consider location/radiation/quality/duration/timing/severity/associated sxs/prior Treatment) HPI  Pt presenting with c/o midsternal chest presure.  He took 2 nitroglycerin which has relieved his pain.  No difficulty breathing currently, but states when he went back to bed he developed shortness of breath.  He was also seen in the ED 2 days ago with similar symptoms.  No fever/chills.  Family member states that he has been having sweats at night and then will wake up with chest pressure.  No cough.  No leg swelling.  He takes torsemide twice daily.  No recent changes in medications.  Symptoms are intermittent.  There are no other associated systemic symptoms, there are no other alleviating or modifying factors.   Past Medical History  Diagnosis Date  . Diabetes mellitus   . Hyperlipidemia   . Coronary artery disease   . Spinal stenosis   . Pneumonia   . Anemia   . Carotid artery disease   . PVD (peripheral vascular disease)   . Hyponatremia     Chronic  . CVA (cerebral infarction)   . CKD (chronic kidney disease) stage 4, GFR 15-29 ml/min   . Long term (current) use of anticoagulants     No bleeding on Eliquis  . PAF (paroxysmal atrial fibrillation)     Asymptomatic. On Eliquis.  . Sick sinus syndrome     With DDD St. Jude PM, initially placed in 1993 by Dr. Nils Pyle. Device upgrade 10/2002 to DDD, by Dr. Rollene Fare complicated by bleeding.  [name] Hypertensive cardiovascular disease   . CHF (congestive heart failure)   . Chronic diastolic congestive heart failure     ECHO 05/20/12 LVEF estimated by 2D at 55-60%  . Arthritis   . Shortness of breath   . Gait disorder 03/31/2014  . Diabetic peripheral neuropathy associated with type 2 diabetes mellitus 03/31/2014   Past Surgical History  Procedure Laterality  Date  . Lumbar laminectomy  1995  . Back surgery    . Total knee arthroplasty Left   . Quadriceps tendon repair    . Cataract extraction    . Carotid endarterectomy    . Yag laser application Right 06/01/1476    Procedure: YAG LASER CAPSULOTOMY OF RIGHT EYE;  Surgeon: [name], MD;  Location: [place];  Service: Ophthalmology;  Laterality: Right;  . Tonsillectomy    . Pacemaker insertion      DDD St. Jude PM, initially placed in 1993 by Dr. Nils Pyle. Device upgrade 10/2002 to DDD, by Dr. Rollene Fare complicated by bleeding.  . Carotid endarterectomy Right 2006  . Transurethral resection of prostate    . Cardioversion N/A 06/16/2013    Procedure: CARDIOVERSION BEDSIDE;  Surgeon: [name], MD;  Location: [place];  Service: Cardiovascular;  Laterality: N/A;  . Pacemaker generator change  12/09/2009    SJM Accent DR RF gen change by Dr [name]   Family History  Problem Relation Age of Onset  . Multiple myeloma Father   . Hypertension Mother    History  Substance Use Topics  . Smoking status: Former Smoker -- 2 years    Types: Pipe    Quit date: 05/14/1958  . Smokeless tobacco: Never Used  . Alcohol Use: No     Comment: Cocktails occasionally  Review of Systems  ROS reviewed and all otherwise negative except for mentioned in HPI    Allergies  Aggrenox; Carvedilol; Claritin; Mucinex; Plavix; Rapaflo; Statins; and Travatan z  Home Medications   Prior to Admission medications   Medication Sig Start Date End Date Taking? Authorizing Provider  allopurinol (ZYLOPRIM) 100 MG tablet Take 200 mg by mouth daily.    Yes Historical Provider, MD  amiodarone (PACERONE) 200 MG tablet Take 1 tablet (200 mg total) by mouth at bedtime. 01/01/14  Yes Ivan Anchors Love, PA-C  apixaban (ELIQUIS) 2.5 MG TABS tablet Take 2.5 mg by mouth 2 (two) times daily.   Yes Historical Provider, MD  DULoxetine (CYMBALTA) 20 MG capsule Take 1 capsule (20 mg total) by mouth daily. 03/31/14  Yes  Kathrynn Ducking, MD  dutasteride (AVODART) 0.5 MG capsule Take 0.5 mg by mouth daily.    Yes Historical Provider, MD  ELIQUIS 2.5 MG TABS tablet take 1 tablet by mouth twice a day 03/19/14  Yes Belva Crome III, MD  ferrous sulfate 325 (65 FE) MG tablet Take 325 mg by mouth every other day.    Yes Historical Provider, MD  fexofenadine (ALLEGRA) 180 MG tablet Take 180 mg by mouth daily.   Yes Historical Provider, MD  Flaxseed, Linseed, (BL FLAX SEED OIL PO) Take 1 capsule by mouth 2 (two) times daily.   Yes Historical Provider, MD  hydrALAZINE (APRESOLINE) 25 MG tablet Take 25 mg by mouth every 12 (twelve) hours.    Yes Historical Provider, MD  insulin aspart (NOVOLOG FLEXPEN) 100 UNIT/ML FlexPen Inject 2-9 Units into the skin 3 (three) times daily with meals. Sliding scale   Yes Historical Provider, MD  insulin glargine (LANTUS) 100 UNIT/ML injection Inject 9-10 Units into the skin at bedtime.    Yes Historical Provider, MD  isosorbide mononitrate (IMDUR) 60 MG 24 hr tablet Take 60 mg by mouth daily. 01/01/14  Yes Ivan Anchors Love, PA-C  metoprolol tartrate (LOPRESSOR) 25 MG tablet Take 1 tablet (25 mg total) by mouth daily. 11/12/13  Yes [name], MD  Multiple Vitamin (MULTIVITAMIN WITH MINERALS) TABS Take 1 tablet by mouth daily. Diabetic pak by Petra Kuba from [facility]   Yes Historical Provider, MD  nitroGLYCERIN (NITROSTAT) 0.4 MG SL tablet Place 1 tablet (0.4 mg total) under the tongue every 5 (five) minutes as needed for chest pain. 12/03/13  Yes Belva Crome III, MD  polyethylene glycol [facility] Floria Raveling) packet Take 17 g by mouth 2 (two) times daily as needed for mild constipation. For constipation   Yes Historical Provider, MD  Polyvinyl Alcohol-Povidone (REFRESH OP) Place 1 drop into both eyes 2 (two) times daily. Refresh Gel Opth   Yes Historical Provider, MD  potassium chloride SA (K-DUR,KLOR-CON) 20 MEQ tablet Take 20 mEq by mouth daily.  01/15/14  Yes Historical Provider, MD  silodosin  (RAPAFLO) 8 MG CAPS capsule Take 8 mg by mouth daily with breakfast.   Yes Historical Provider, MD  Tafluprost (ZIOPTAN) 0.0015 % SOLN Place 1 drop into both eyes at bedtime. Use at night   Yes Historical Provider, MD  torsemide (DEMADEX) 20 MG tablet Take 4 tablets(68m) in the morning and three tablets (66m in afternoon 01/14/14  Yes HeBelva CromeII, MD   BP 148/68 mmHg  Pulse 60  Temp(Src) 98.5 F (36.9 C) (Oral)  Resp 19  SpO2 95%  Vitals reviewed Physical Exam  Physical Examination: General appearance - alert, well appearing, and in  no distress Mental status - alert, oriented to person, place, and time Eyes - no conjunctival injection, no scleral icterus Mouth - mucous membranes moist, pharynx normal without lesions Chest - clear to auscultation, no wheezes, rales or rhonchi, symmetric air entry Heart - normal rate, regular rhythm, normal S1, S2, no murmurs, rubs, clicks or gallops Abdomen - soft, nontender, nondistended, no masses or organomegaly Extremities - peripheral pulses normal, no pedal edema, no clubbing or cyanosis Skin - normal coloration and turgor, no rashes  ED Course  Procedures (including critical care time)  10:51 AM d/w Dr. Wynonia Lawman cardiology, they will see patient in the ED for evaluation.   Labs Review Labs Reviewed  CBC - Abnormal; Notable for the following:    RBC 3.80 (*)    Hemoglobin 12.8 (*)    MCV 103.7 (*)    RDW 15.9 (*)    All other components within normal limits  BASIC METABOLIC PANEL - Abnormal; Notable for the following:    Chloride 95 (*)    Glucose, Bld 174 (*)    BUN 37 (*)    Creatinine, Ser 2.52 (*)    GFR calc non Af Amer 20 (*)    GFR calc Af Amer 24 (*)    All other components within normal limits  PRO B NATRIURETIC PEPTIDE - Abnormal; Notable for the following:    Pro B Natriuretic peptide (BNP) 4874.0 (*)    All other components within normal limits  CBG MONITORING, ED - Abnormal; Notable for the following:     Glucose-Capillary 163 (*)    All other components within normal limits  TROPONIN I  TROPONIN I  TROPONIN I  TROPONIN I  HEMOGLOBIN A1C    Imaging Review Dg Chest 2 View  04/04/2014   CLINICAL DATA:  Shortness of breath 2 days.  EXAM: CHEST  2 VIEW  COMPARISON:  04/02/2014  FINDINGS: Left-sided pacemaker unchanged. Lungs are somewhat hypoinflated demonstrate worsening hazy perihilar bibasilar opacification with worsening small bilateral pleural effusions. Cardiomediastinal silhouette and remainder of the exam is unchanged.  IMPRESSION: Worsening hazy perihilar bibasilar opacification likely interstitial edema and less likely infection. Worsening small bilateral pleural effusions.   Electronically Signed   By: Marin Olp M.D.   On: 04/04/2014 09:26     EKG Interpretation   Date/Time:  Sunday April 04 2014 07:14:44 EST Ventricular Rate:  60 PR Interval:  293 QRS Duration: 105 QT Interval:  481 QTC Calculation: 481 R Axis:   -56 Text Interpretation:  Atrial-paced rhythm Left anterior fascicular block  Borderline repolarization abnormality Borderline prolonged QT interval No  significant change since last tracing Confirmed by [facility]  MD, MARTHA  [phone]) on 04/04/2014 9:24:01 AM      MDM   Final diagnoses:  Chest pain    Pt presenting with c/o chest pain, intermittent with intermittent shortness of breath.  Chest pain is worse with exertion and shortness of breath is worse with lying flat.  Workup in the ED reassuring with BNP at his baseline.  CXR shows some interstitial edema and pleural effusions.  Cardiology consulted and will see patient in the ED.     Threasa Beards, MD 04/04/14 [phone]

## 2014-04-05 ENCOUNTER — Other Ambulatory Visit: Payer: Self-pay

## 2014-04-05 ENCOUNTER — Encounter (HOSPITAL_COMMUNITY): Payer: Self-pay

## 2014-04-05 DIAGNOSIS — I25110 Atherosclerotic heart disease of native coronary artery with unstable angina pectoris: Secondary | ICD-10-CM | POA: Diagnosis not present

## 2014-04-05 DIAGNOSIS — R079 Chest pain, unspecified: Secondary | ICD-10-CM | POA: Insufficient documentation

## 2014-04-05 DIAGNOSIS — R072 Precordial pain: Secondary | ICD-10-CM

## 2014-04-05 DIAGNOSIS — I10 Essential (primary) hypertension: Secondary | ICD-10-CM

## 2014-04-05 DIAGNOSIS — I200 Unstable angina: Secondary | ICD-10-CM

## 2014-04-05 DIAGNOSIS — N184 Chronic kidney disease, stage 4 (severe): Secondary | ICD-10-CM

## 2014-04-05 LAB — GLUCOSE, CAPILLARY
GLUCOSE-CAPILLARY: 128 mg/dL — AB (ref 70–99)
GLUCOSE-CAPILLARY: 133 mg/dL — AB (ref 70–99)
Glucose-Capillary: 154 mg/dL — ABNORMAL HIGH (ref 70–99)

## 2014-04-05 LAB — BASIC METABOLIC PANEL
Anion gap: 12 (ref 5–15)
BUN: 40 mg/dL — AB (ref 6–23)
CHLORIDE: 97 meq/L (ref 96–112)
CO2: 30 mEq/L (ref 19–32)
Calcium: 9.1 mg/dL (ref 8.4–10.5)
Creatinine, Ser: 2.42 mg/dL — ABNORMAL HIGH (ref 0.50–1.35)
GFR calc Af Amer: 25 mL/min — ABNORMAL LOW (ref >90)
GFR calc non Af Amer: 22 mL/min — ABNORMAL LOW (ref >90)
Glucose, Bld: 164 mg/dL — ABNORMAL HIGH (ref 70–99)
Potassium: 4.2 mEq/L (ref 3.7–5.3)
Sodium: 139 mEq/L (ref 137–147)

## 2014-04-05 LAB — TROPONIN I: Troponin I: <0.3 ng/mL (ref <0.30)

## 2014-04-05 MED ORDER — TORSEMIDE 20 MG PO TABS: 80.0000 mg | ORAL_TABLET | Freq: Two times a day (BID) | ORAL | Status: DC

## 2014-04-05 NOTE — Discharge Summary (Signed)
CARDIOLOGY DISCHARGE SUMMARY   Patient ID: [name] MRN: [id number] DOB/AGE: 1918-12-23 80 y.o.  Admit date: 04/04/2014 Discharge date: 04/05/2014  PCP: [name], MD Primary Cardiologist: [name]  Primary Discharge Diagnosis: Unstable anginal pain Secondary Discharge Diagnosis:    Chest pain   Essential hypertension  Procedures: Two-view chest x-ray  Hospital Course: [name] is a 80 y.o. male with a history of CAD, PVD, CK D and diastolic CHF. He had some chest pain associated with shortness of breath and came to the hospital where he was admitted for further evaluation and treatment.  His cardiac enzymes were negative for MI. His symptoms resolved and he was able to ambulate without recurrence. He is to continue radical therapy for CAD, but no further inpatient workup is indicated at this time.  His chest x-ray showed some edema and his BNP was elevated. However, his renal function is chronically abnormal. Therefore, his oral Lasix is to be increased for 2 days and he is to continue to track his weights at home. He will follow-up in the office.  On 11/23, he was seen by Dr. Meda Coffee and all data were reviewed. He was ambulating well and no further inpatient workup is indicated. He is considered stable for discharge, to follow up as an outpatient.  Labs:   Lab Results  Component Value Date   WBC 6.0 04/04/2014   HGB 12.8* 04/04/2014   HCT 39.4 04/04/2014   MCV 103.7* 04/04/2014   PLT 199 04/04/2014    Recent Labs Lab 04/02/14 0502  04/05/14 0015  NA 140  < > 139  K 4.3  < > 4.2  CL 96  < > 97  CO2 32  < > 30  BUN 37*  < > 40*  CREATININE 2.41*  < > 2.42*  CALCIUM 9.4  < > 9.1  PROT 7.0  --   --   BILITOT 0.4  --   --   ALKPHOS 60  --   --   ALT 54*  --   --   AST 55*  --   --   GLUCOSE 110*  < > 164*  < > = values in this interval not displayed.  Recent Labs  04/04/14 1250 04/04/14 1829 04/05/14 0015  TROPONINI <0.30 <0.30 <0.30      PRO B NATRIURETIC PEPTIDE (BNP)  Date/Time Value Ref Range Status  04/04/2014 07:42 AM 4874.0* 0 - 450 pg/mL Final  12/23/2013 05:35 AM 4564.0* 0 - 450 pg/mL Final     Radiology: Dg Chest 2 View 04/04/2014   CLINICAL DATA:  Shortness of breath 2 days.  EXAM: CHEST  2 VIEW  COMPARISON:  04/02/2014  FINDINGS: Left-sided pacemaker unchanged. Lungs are somewhat hypoinflated demonstrate worsening hazy perihilar bibasilar opacification with worsening small bilateral pleural effusions. Cardiomediastinal silhouette and remainder of the exam is unchanged.  IMPRESSION: Worsening hazy perihilar bibasilar opacification likely interstitial edema and less likely infection. Worsening small bilateral pleural effusions.   Electronically Signed   By: Marin Olp M.D.   On: 04/04/2014 09:26   Dg Chest 2 View 04/02/2014   CLINICAL DATA:  Shortness of breath.  Chest tightness since 3 a.m.  EXAM: CHEST  2 VIEW  COMPARISON:  12/23/2013  FINDINGS: Heart size and pulmonary vascularity are normal. Mild linear infiltration or atelectasis in the lung bases. Small bilateral pleural effusions. Improving vascular congestion since previous study. Cardiac pacemaker. No pneumothorax.  IMPRESSION: Small pleural effusions with infiltration or  atelectasis in the lung bases.   Electronically Signed   By: [name] M.D.   On: 04/02/2014 05:27   EKG: Atrial pacing  FOLLOW UP PLANS AND APPOINTMENTS Allergies  Allergen Reactions  . Aggrenox [Aspirin-Dipyridamole Er] Other (See Comments)    Dizziness  . Carvedilol Other (See Comments)    Dizziness  . Claritin [Loratadine] Other (See Comments)    Dizziness & drowsiness   . Mucinex [Guaifenesin Er] Other (See Comments)    Dizziness, drowsiness  . Plavix [Clopidogrel Bisulfate] Other (See Comments)    Dizziness  . Rapaflo [Silodosin] Other (See Comments)    Dizziness for about 30- 45  minutes  . Statins Other (See Comments)    rhabdomyolisis  . Travatan Z  [Travoprost] Other (See Comments)    Eye irritation     Medication List    TAKE these medications        allopurinol 100 MG tablet  Commonly known as:  ZYLOPRIM  Take 200 mg by mouth daily.     amiodarone 200 MG tablet  Commonly known as:  PACERONE  Take 1 tablet (200 mg total) by mouth at bedtime.     BL FLAX SEED OIL PO  Take 1 capsule by mouth 2 (two) times daily.     DULoxetine 20 MG capsule  Commonly known as:  CYMBALTA  Take 1 capsule (20 mg total) by mouth daily.     dutasteride 0.5 MG capsule  Commonly known as:  AVODART  Take 0.5 mg by mouth daily.     ELIQUIS 2.5 MG Tabs tablet  Generic drug:  apixaban  take 1 tablet by mouth twice a day     ferrous sulfate 325 (65 FE) MG tablet  Take 325 mg by mouth every other day.     fexofenadine 180 MG tablet  Commonly known as:  ALLEGRA  Take 180 mg by mouth daily.     hydrALAZINE 25 MG tablet  Commonly known as:  APRESOLINE  Take 25 mg by mouth every 12 (twelve) hours.     insulin glargine 100 UNIT/ML injection  Commonly known as:  LANTUS  Inject 9-10 Units into the skin at bedtime.     isosorbide mononitrate 60 MG 24 hr tablet  Commonly known as:  IMDUR  Take 60 mg by mouth daily.     metoprolol tartrate 25 MG tablet  Commonly known as:  LOPRESSOR  Take 1 tablet (25 mg total) by mouth daily.     multivitamin with minerals Tabs tablet  Take 1 tablet by mouth daily. Diabetic pak by Petra Kuba from [facility]     nitroGLYCERIN 0.4 MG SL tablet  Commonly known as:  NITROSTAT  Place 1 tablet (0.4 mg total) under the tongue every 5 (five) minutes as needed for chest pain.     NOVOLOG FLEXPEN 100 UNIT/ML FlexPen  Generic drug:  insulin aspart  Inject 2-9 Units into the skin 3 (three) times daily with meals. Sliding scale     polyethylene glycol packet  Commonly known as:  MIRALAX / GLYCOLAX  Take 17 g by mouth 2 (two) times daily as needed for mild constipation. For constipation     potassium chloride SA 20 MEQ  tablet  Commonly known as:  K-DUR,KLOR-CON  Take 20 mEq by mouth daily.     REFRESH OP  Place 1 drop into both eyes 2 (two) times daily. Refresh Gel Opth     silodosin 8 MG Caps capsule  Commonly known as:  RAPAFLO  Take 8 mg by mouth daily with breakfast.     torsemide 20 MG tablet  Commonly known as:  DEMADEX  Take 4-5 tablets (80-100 mg total) by mouth 2 (two) times daily. Take 5 tabs (100 mg) BID x 2 d, then take 4 tabs (80mg ) bid.     ZIOPTAN 0.0015 % Soln  Generic drug:  Tafluprost  Place 1 drop into both eyes at bedtime. Use at night        Discharge Instructions    (HEART FAILURE PATIENTS) Call MD:  Anytime you have any of the following symptoms: 1) 3 pound weight gain in 24 hours or 5 pounds in 1 week 2) shortness of breath, with or without a dry hacking cough 3) swelling in the hands, feet or stomach 4) if you have to sleep on extra pillows at night in order to breathe.    Complete by:  As directed      Diet - low sodium heart healthy    Complete by:  As directed      Diet Carb Modified    Complete by:  As directed      Increase activity slowly    Complete by:  As directed           Follow-up Information    Follow up with [name], MD On 04/16/2014.   Specialty:  Cardiology   Why:  Keep 8:45 AM appointment   Contact information:   [address] [address] [phone]       BRING ALL MEDICATIONS WITH YOU TO FOLLOW UP APPOINTMENTS  Time spent with patient to include physician time: 41 min Signed: Rosaria Ferries, PA-C 04/05/2014, 3:44 PM Co-Sign MD

## 2014-04-05 NOTE — Progress Notes (Signed)
Patient Name: [name] Date of Encounter: 04/05/2014  Active Problems:   Unstable angina   Primary Cardiologist: [name]  Patient Profile: 80 yo male w/ hx DM, PVC, CAD, CKD, D-CHF, who had some chest pain and was admitted 11/22 for observation.  SUBJECTIVE: No more chest pain, denies SOB. Has not ambulated in the hall.   OBJECTIVE Filed Vitals:   04/04/14 1234 04/04/14 1300 04/04/14 1400 04/05/14 0557  BP: 159/76 148/68 142/63 114/57  Pulse: 59 60 64 64  Temp:   98.7 F (37.1 C) 97.8 F (36.6 C)  TempSrc:   Oral Oral  Resp: 17 19 18 20   Height:   5\' 7"  (1.702 m)   Weight:   195 lb 12.3 oz (88.8 kg) 194 lb (87.998 kg)  SpO2: 97% 95% 99% 94%    Intake/Output Summary (Last 24 hours) at 04/05/14 1212 Last data filed at 04/05/14 1610  Gross per 24 hour  Intake    980 ml  Output    650 ml  Net    330 ml   Filed Weights   04/04/14 1400 04/05/14 0557  Weight: 195 lb 12.3 oz (88.8 kg) 194 lb (87.998 kg)    PHYSICAL EXAM General: Well developed, well nourished, male in no acute distress. Head: Normocephalic, atraumatic.  Neck: Supple without bruits, JVD 9 cm Lungs:  Resp regular and unlabored, rales bases. Heart: RRR, S1, S2, no S3, S4, or murmur; no rub. Abdomen: Soft, non-tender, non-distended, BS + x 4.  Extremities: No clubbing, cyanosis, no edema.  Neuro: Alert and oriented X 3. Moves all extremities spontaneously. Psych: Normal affect.  LABS: CBC: Recent Labs  04/04/14 0742  WBC 6.0  HGB 12.8*  HCT 39.4  MCV 103.7*  PLT 960   Basic Metabolic Panel: Recent Labs  04/04/14 0742 04/05/14 0015  NA 141 139  K 4.3 4.2  CL 95* 97  CO2 31 30  GLUCOSE 174* 164*  BUN 37* 40*  CREATININE 2.52* 2.42*  CALCIUM 9.5 9.1   Cardiac Enzymes: Recent Labs  04/04/14 1250 04/04/14 1829 04/05/14 0015  TROPONINI <0.30 <0.30 <0.30   BNP: PRO B NATRIURETIC PEPTIDE (BNP)  Date/Time Value Ref Range Status  04/04/2014 07:42 AM 4874.0* 0 - 450  pg/mL Final  12/23/2013 05:35 AM 4564.0* 0 - 450 pg/mL Final   Hemoglobin A1C: Recent Labs  04/04/14 1250  HGBA1C 7.9*   TELE:  Generally A paced, with 5 bt WCT that is irregular    Radiology/Studies: Dg Chest 2 View 04/04/2014   CLINICAL DATA:  Shortness of breath 2 days.  EXAM: CHEST  2 VIEW  COMPARISON:  04/02/2014  FINDINGS: Left-sided pacemaker unchanged. Lungs are somewhat hypoinflated demonstrate worsening hazy perihilar bibasilar opacification with worsening small bilateral pleural effusions. Cardiomediastinal silhouette and remainder of the exam is unchanged.  IMPRESSION: Worsening hazy perihilar bibasilar opacification likely interstitial edema and less likely infection. Worsening small bilateral pleural effusions.   Electronically Signed   By: Marin Olp M.D.   On: 04/04/2014 09:26   Current Medications:  . allopurinol  200 mg Oral Daily  . amiodarone  200 mg Oral QHS  . apixaban  2.5 mg Oral BID  . aspirin EC  81 mg Oral Daily  . DULoxetine  20 mg Oral Daily  . dutasteride  0.5 mg Oral Daily  . ferrous sulfate  325 mg Oral QODAY  . fexofenadine  180 mg Oral Daily  . hydrALAZINE  25 mg Oral Q12H  .  insulin aspart  0-15 Units Subcutaneous TID WC  . insulin aspart  0-5 Units Subcutaneous QHS  . insulin glargine  9 Units Subcutaneous QHS  . isosorbide mononitrate  60 mg Oral Daily  . latanoprost  1 drop Both Eyes QHS  . metoprolol tartrate  25 mg Oral Daily  . multivitamin with minerals  1 tablet Oral Daily  . polyvinyl alcohol  1 drop Both Eyes BID  . potassium chloride SA  20 mEq Oral Daily  . ranolazine  500 mg Oral BID  . silodosin  8 mg Oral Q breakfast  . torsemide  80 mg Oral BID      ASSESSMENT AND PLAN: Active Problems:   Unstable angina - ez negative for MI, med rx for CAD with ASA, Imdur 60 mg, BB, Ranexa, not on statin due to intolerance. Ambulate and see how tolerated.    Diastolic CHF - on home dose torsemide. Some volume overload by exam, will  leave diuretics to MD with CKD IV.     DM - A1c is elevated but stable.  Plan - MD advise on d/c planning.  Signed, Rosaria Ferries , PA-C 12:12 PM 04/05/2014   The patient was seen, examined and discussed with Rosaria Ferries, PA-C and I agree with the above.   80 year old male admitted with chest pain that has resolved, negative enzymes. He has acute on chronic diastolic CHF, mildly fluid overloaded. He wants to go home. We will increase his torsemide to 100 mg po bid x 2 days then continue to 80 mg po bid and follow in 2 -3 weeks with Dr [name].  [name] 04/05/2014

## 2014-04-05 NOTE — Progress Notes (Signed)
UR completed 

## 2014-04-06 ENCOUNTER — Encounter (HOSPITAL_COMMUNITY): Payer: Self-pay | Admitting: Emergency Medicine

## 2014-04-06 ENCOUNTER — Emergency Department (HOSPITAL_COMMUNITY)
Admission: EM | Admit: 2014-04-06 | Discharge: 2014-04-06 | Disposition: A | Discharge status: Home / Self Care | Payer: Medicare Other | Attending: Emergency Medicine | Admitting: Emergency Medicine

## 2014-04-06 DIAGNOSIS — Z8701 Personal history of pneumonia (recurrent): Secondary | ICD-10-CM | POA: Diagnosis not present

## 2014-04-06 DIAGNOSIS — Z950 Presence of cardiac pacemaker: Secondary | ICD-10-CM | POA: Insufficient documentation

## 2014-04-06 DIAGNOSIS — D649 Anemia, unspecified: Secondary | ICD-10-CM | POA: Diagnosis not present

## 2014-04-06 DIAGNOSIS — M1990 Unspecified osteoarthritis, unspecified site: Secondary | ICD-10-CM | POA: Diagnosis not present

## 2014-04-06 DIAGNOSIS — Z79899 Other long term (current) drug therapy: Secondary | ICD-10-CM | POA: Diagnosis not present

## 2014-04-06 DIAGNOSIS — Z8673 Personal history of transient ischemic attack (TIA), and cerebral infarction without residual deficits: Secondary | ICD-10-CM | POA: Diagnosis not present

## 2014-04-06 DIAGNOSIS — R109 Unspecified abdominal pain: Secondary | ICD-10-CM

## 2014-04-06 DIAGNOSIS — Z9889 Other specified postprocedural states: Secondary | ICD-10-CM | POA: Insufficient documentation

## 2014-04-06 DIAGNOSIS — I130 Hypertensive heart and chronic kidney disease with heart failure and stage 1 through stage 4 chronic kidney disease, or unspecified chronic kidney disease: Secondary | ICD-10-CM | POA: Diagnosis not present

## 2014-04-06 DIAGNOSIS — I2510 Atherosclerotic heart disease of native coronary artery without angina pectoris: Secondary | ICD-10-CM | POA: Insufficient documentation

## 2014-04-06 DIAGNOSIS — I5032 Chronic diastolic (congestive) heart failure: Secondary | ICD-10-CM | POA: Insufficient documentation

## 2014-04-06 DIAGNOSIS — K5900 Constipation, unspecified: Secondary | ICD-10-CM | POA: Diagnosis not present

## 2014-04-06 DIAGNOSIS — Z87891 Personal history of nicotine dependence: Secondary | ICD-10-CM | POA: Insufficient documentation

## 2014-04-06 DIAGNOSIS — Z794 Long term (current) use of insulin: Secondary | ICD-10-CM | POA: Insufficient documentation

## 2014-04-06 DIAGNOSIS — E119 Type 2 diabetes mellitus without complications: Secondary | ICD-10-CM | POA: Diagnosis not present

## 2014-04-06 DIAGNOSIS — E871 Hypo-osmolality and hyponatremia: Secondary | ICD-10-CM | POA: Insufficient documentation

## 2014-04-06 DIAGNOSIS — N184 Chronic kidney disease, stage 4 (severe): Secondary | ICD-10-CM | POA: Insufficient documentation

## 2014-04-06 DIAGNOSIS — Z7901 Long term (current) use of anticoagulants: Secondary | ICD-10-CM | POA: Diagnosis not present

## 2014-04-06 MED ORDER — ONDANSETRON 4 MG PO TBDP
4.0000 mg | ORAL_TABLET | Freq: Once | ORAL | Status: AC
  Administered 2014-04-06: 4 mg via ORAL
  Filled 2014-04-06: qty 1

## 2014-04-06 NOTE — ED Provider Notes (Signed)
CSN: [id number]     Arrival date & time 04/06/14  9381 History   First MD Initiated Contact with Patient 04/06/14 0715     Chief Complaint  Patient presents with  . Abdominal Pain    The patient says he has not had a BM in four days.     (Consider location/radiation/quality/duration/timing/severity/associated sxs/prior Treatment) HPI   89yM with abdominal pain. Concerned for possible obstruction. Has not had BM in past 4 days. Typically takes several doses of miralax per day. Recent hospitalization and caretaker does not think he was getting it at this frequency. When I entered room pt actually sitting on bedside commode and having BM. Reports still having cramping pain, but improved.  Past Medical History  Diagnosis Date  . Diabetes mellitus   . Hyperlipidemia   . Coronary artery disease   . Spinal stenosis   . Pneumonia   . Anemia   . Carotid artery disease   . PVD (peripheral vascular disease)   . Hyponatremia     Chronic  . CVA (cerebral infarction)   . CKD (chronic kidney disease) stage 4, GFR 15-29 ml/min   . Long term (current) use of anticoagulants     No bleeding on Eliquis  . PAF (paroxysmal atrial fibrillation)     Asymptomatic. On Eliquis.  . Sick sinus syndrome     With DDD St. Jude PM, initially placed in 1993 by Dr. Nils Pyle. Device upgrade 10/2002 to DDD, by Dr. Rollene Fare complicated by bleeding.  [name] Hypertensive cardiovascular disease   . CHF (congestive heart failure)   . Chronic diastolic congestive heart failure     ECHO 05/20/12 LVEF estimated by 2D at 55-60%  . Arthritis   . Shortness of breath   . Gait disorder 03/31/2014  . Diabetic peripheral neuropathy associated with type 2 diabetes mellitus 03/31/2014   Past Surgical History  Procedure Laterality Date  . Lumbar laminectomy  1995  . Back surgery    . Total knee arthroplasty Left   . Quadriceps tendon repair    . Cataract extraction    . Carotid endarterectomy    . Yag laser application Right  0/17/5102    Procedure: YAG LASER CAPSULOTOMY OF RIGHT EYE;  Surgeon: [name], MD;  Location: [place];  Service: Ophthalmology;  Laterality: Right;  . Tonsillectomy    . Pacemaker insertion      DDD St. Jude PM, initially placed in 1993 by Dr. Nils Pyle. Device upgrade 10/2002 to DDD, by Dr. Rollene Fare complicated by bleeding.  . Carotid endarterectomy Right 2006  . Transurethral resection of prostate    . Cardioversion N/A 06/16/2013    Procedure: CARDIOVERSION BEDSIDE;  Surgeon: [name], MD;  Location: [facility];  Service: Cardiovascular;  Laterality: N/A;  . Pacemaker generator change  12/09/2009    SJM Accent DR RF gen change by Dr [name]   Family History  Problem Relation Age of Onset  . Multiple myeloma Father   . Hypertension Mother    History  Substance Use Topics  . Smoking status: Former Smoker -- 2 years    Types: Pipe    Quit date: 05/14/1958  . Smokeless tobacco: Never Used  . Alcohol Use: No     Comment: Cocktails occasionally    Review of Systems  All systems reviewed and negative, other than as noted in HPI.   Allergies  Aggrenox; Carvedilol; Claritin; Mucinex; Plavix; Rapaflo; Statins; and Travatan z  Home Medications   Prior to  Admission medications   Medication Sig Start Date End Date Taking? Authorizing Provider  allopurinol (ZYLOPRIM) 100 MG tablet Take 200 mg by mouth daily.    Yes Historical Provider, MD  amiodarone (PACERONE) 200 MG tablet Take 1 tablet (200 mg total) by mouth at bedtime. 01/01/14  Yes Ivan Anchors Love, PA-C  DULoxetine (CYMBALTA) 20 MG capsule Take 1 capsule (20 mg total) by mouth daily. 03/31/14  Yes Kathrynn Ducking, MD  dutasteride (AVODART) 0.5 MG capsule Take 0.5 mg by mouth daily.    Yes Historical Provider, MD  ELIQUIS 2.5 MG TABS tablet take 1 tablet by mouth twice a day 03/19/14  Yes Belva Crome III, MD  ferrous sulfate 325 (65 FE) MG tablet Take 325 mg by mouth every other day.    Yes Historical Provider, MD   fexofenadine (ALLEGRA) 180 MG tablet Take 180 mg by mouth daily.   Yes Historical Provider, MD  Flaxseed, Linseed, (BL FLAX SEED OIL PO) Take 1 capsule by mouth 2 (two) times daily.   Yes Historical Provider, MD  hydrALAZINE (APRESOLINE) 25 MG tablet Take 25 mg by mouth every 12 (twelve) hours.    Yes Historical Provider, MD  insulin aspart (NOVOLOG FLEXPEN) 100 UNIT/ML FlexPen Inject 2-9 Units into the skin 3 (three) times daily with meals. Sliding scale   Yes Historical Provider, MD  insulin glargine (LANTUS) 100 UNIT/ML injection Inject 9-10 Units into the skin at bedtime.    Yes Historical Provider, MD  isosorbide mononitrate (IMDUR) 60 MG 24 hr tablet Take 60 mg by mouth daily. 01/01/14  Yes Ivan Anchors Love, PA-C  metoprolol tartrate (LOPRESSOR) 25 MG tablet Take 1 tablet (25 mg total) by mouth daily. 11/12/13  Yes [name], MD  Multiple Vitamin (MULTIVITAMIN WITH MINERALS) TABS Take 1 tablet by mouth daily. Diabetic pak by Petra Kuba from [facility]   Yes Historical Provider, MD  nitroGLYCERIN (NITROSTAT) 0.4 MG SL tablet Place 1 tablet (0.4 mg total) under the tongue every 5 (five) minutes as needed for chest pain. 12/03/13  Yes Belva Crome III, MD  polyethylene glycol [facility] / Floria Raveling) packet Take 17 g by mouth 2 (two) times daily as needed for mild constipation. For constipation   Yes Historical Provider, MD  Polyvinyl Alcohol-Povidone (REFRESH OP) Place 1 drop into both eyes 2 (two) times daily. Refresh Gel Opth   Yes Historical Provider, MD  potassium chloride SA (K-DUR,KLOR-CON) 20 MEQ tablet Take 20 mEq by mouth daily.  01/15/14  Yes Historical Provider, MD  silodosin (RAPAFLO) 8 MG CAPS capsule Take 8 mg by mouth daily with breakfast.   Yes Historical Provider, MD  Tafluprost (ZIOPTAN) 0.0015 % SOLN Place 1 drop into both eyes at bedtime. Use at night   Yes Historical Provider, MD  torsemide (DEMADEX) 20 MG tablet Take 4-5 tablets (80-100 mg total) by mouth 2 (two) times daily. Take 5  tabs (100 mg) BID x 2 d, then take 4 tabs (33m) bid. 04/05/14  Yes Rhonda G Barrett, PA-C   BP 124/57 mmHg  Pulse 61  Temp(Src) 98.6 F (37 C) (Oral)  Resp 19  SpO2 92% Physical Exam  Constitutional: He appears well-developed and well-nourished. No distress.  HENT:  Head: Normocephalic and atraumatic.  Eyes: Conjunctivae are normal. Right eye exhibits no discharge. Left eye exhibits no discharge.  Neck: Neck supple.  Cardiovascular: Normal rate, regular rhythm and normal heart sounds.  Exam reveals no gallop and no friction rub.   No murmur heard.  Pulmonary/Chest: Effort normal and breath sounds normal. No respiratory distress.  Abdominal: Soft. He exhibits no distension. There is no tenderness.  Musculoskeletal: He exhibits no edema or tenderness.  Neurological: He is alert.  Skin: Skin is warm and dry.  Psychiatric: He has a normal mood and affect. His behavior is normal. Thought content normal.  Nursing note and vitals reviewed.   ED Course  Procedures (including critical care time) Labs Review Labs Reviewed - No data to display  Imaging Review Dg Chest 2 View  04/04/2014   CLINICAL DATA:  Shortness of breath 2 days.  EXAM: CHEST  2 VIEW  COMPARISON:  04/02/2014  FINDINGS: Left-sided pacemaker unchanged. Lungs are somewhat hypoinflated demonstrate worsening hazy perihilar bibasilar opacification with worsening small bilateral pleural effusions. Cardiomediastinal silhouette and remainder of the exam is unchanged.  IMPRESSION: Worsening hazy perihilar bibasilar opacification likely interstitial edema and less likely infection. Worsening small bilateral pleural effusions.   Electronically Signed   By: Marin Olp M.D.   On: 04/04/2014 09:26     EKG Interpretation None      MDM   Final diagnoses:  Constipation, unspecified constipation type  Abdominal pain, unspecified abdominal location    89yM with abdominal pain and nausea. No BM in 4 days. Sounds like he is  laxative dependent at least to some degree as caretaker reports he takes miralax daily and often up to 3x/day. Currently on bedside commode having BM. Reports feels somewhat better.   Pt still on bedside commode and passing stool.  Remains on stool. Reports abdominal pain improved and now just having "cramps."  Back in bed. Abdomen soft. NT. Really suspect presenting symptoms from constipation. Reports he feels better. No further pain and nausea resolved    Virgel Manifold, MD 04/14/14 1422

## 2014-04-06 NOTE — Discharge Instructions (Signed)
Abdominal Pain Many things can cause abdominal pain. Usually, abdominal pain is not caused by a disease and will improve without treatment. It can often be observed and treated at home. Your health care provider will do a physical exam and possibly order blood tests and X-rays to help determine the seriousness of your pain. However, in many cases, more time must pass before a clear cause of the pain can be found. Before that point, your health care provider may not know if you need more testing or further treatment. HOME CARE INSTRUCTIONS  Monitor your abdominal pain for any changes. The following actions may help to alleviate any discomfort you are experiencing:  Only take over-the-counter or prescription medicines as directed by your health care provider.  Do not take laxatives unless directed to do so by your health care provider.  Try a clear liquid diet (broth, tea, or water) as directed by your health care provider. Slowly move to a bland diet as tolerated. SEEK MEDICAL CARE IF:  You have unexplained abdominal pain.  You have abdominal pain associated with nausea or diarrhea.  You have pain when you urinate or have a bowel movement.  You experience abdominal pain that wakes you in the night.  You have abdominal pain that is worsened or improved by eating food.  You have abdominal pain that is worsened with eating fatty foods.  You have a fever. SEEK IMMEDIATE MEDICAL CARE IF:   Your pain does not go away within 2 hours.  You keep throwing up (vomiting).  Your pain is felt only in portions of the abdomen, such as the right side or the left lower portion of the abdomen.  You pass bloody or black tarry stools. MAKE SURE YOU:  Understand these instructions.   Will watch your condition.   Will get help right away if you are not doing well or get worse.  Document Released: 02/07/2005 Document Revised: 05/05/2013 Document Reviewed: 01/07/2013 [facility] Patient Information  2015 [place], [place]. This information is not intended to replace advice given to you by your health care provider. Make sure you discuss any questions you have with your health care provider.  Constipation Constipation is when a person:  Poops (has a bowel movement) less than 3 times a week.  Has a hard time pooping.  Has poop that is dry, hard, or bigger than normal. HOME CARE   Eat foods with a lot of fiber in them. This includes fruits, vegetables, beans, and whole grains such as brown rice.  Avoid fatty foods and foods with a lot of sugar. This includes french fries, hamburgers, cookies, candy, and soda.  If you are not getting enough fiber from food, take products with added fiber in them (supplements).  Drink enough fluid to keep your pee (urine) clear or pale yellow.  Exercise on a regular basis, or as told by your doctor.  Go to the restroom when you feel like you need to poop. Do not hold it.  Only take medicine as told by your doctor. Do not take medicines that help you poop (laxatives) without talking to your doctor first. GET HELP RIGHT AWAY IF:   You have bright red blood in your poop (stool).  Your constipation lasts more than 4 days or gets worse.  You have belly (abdominal) or butt (rectal) pain.  You have thin poop (as thin as a pencil).  You lose weight, and it cannot be explained. MAKE SURE YOU:   Understand these instructions.  Will  watch your condition.  Will get help right away if you are not doing well or get worse. Document Released: 10/17/2007 Document Revised: 05/05/2013 Document Reviewed: 02/09/2013 [facility] Patient Information 2015 [place], [place]. This information is not intended to replace advice given to you by your health care provider. Make sure you discuss any questions you have with your health care provider.

## 2014-04-06 NOTE — ED Notes (Signed)
The patient says he has not had a BM in four days.  The patient called because he started having abdominal pain last night due to not being able to have a BM.  The patient is alert and oriented and called EMS to transport him to [facility].

## 2014-04-12 ENCOUNTER — Encounter: Payer: Self-pay | Admitting: Cardiology

## 2014-04-15 NOTE — Progress Notes (Signed)
Patient ID: [name], male   DOB: 1918-05-22, 80 y.o.   MRN: [id number]    [address], [address], [place]  [address] Phone: [phone] Fax:  [phone]  Date:  04/16/2014   ID:  EDRIAN MELUCCI, DOB 1918/08/07, MRN [id number]  PCP:  [name], MD   ASSESSMENT:  1. Chronic diastolic heart failure, stable 2. Paroxysmal atrial fibrillation, with rhythm control on amiodarone 3. Chronic kidney failure, stage III-IV, creatinine 2.4, 04/05/2014 4. Hypertension 5. Presumed coronary artery disease 6 Chronic anticoagulation therapy 7. Sick sinus syndrome  PLAN:  1. Continue the same medical regimen 2. Clinical follow-up in one month 3. Basic metabolic panel on return    SUBJECTIVE: [name] is a 80 y.o. male who recently had a hospitalization for dyspnea. He presented with chest discomfort in addition. He has had no recurrence. His diuretic therapy was increased for several days due to an increased BNP. He also tells me that one of the problems at that time was urinary retention. After his bladder was decompressed with a Foley, he began feeling much better. He was sent home on an enhanced diuretic regimen but this caused significant weakness and he is now back on the typical diuretic regimen that we use prior to admission to the hospital. The current torsemide dose is 80 mg each morning and 60 mg each evening.   Wt Readings from Last 3 Encounters:  04/16/14 187 lb 6.4 oz (85.004 kg)  04/05/14 194 lb (87.998 kg)  04/02/14 190 lb (86.183 kg)     Past Medical History  Diagnosis Date  . Diabetes mellitus   . Hyperlipidemia   . Coronary artery disease   . Spinal stenosis   . Pneumonia   . Anemia   . Carotid artery disease   . PVD (peripheral vascular disease)   . Hyponatremia     Chronic  . CVA (cerebral infarction)   . CKD (chronic kidney disease) stage 4, GFR 15-29 ml/min   . Long term (current) use of anticoagulants     No bleeding on Eliquis    . PAF (paroxysmal atrial fibrillation)     Asymptomatic. On Eliquis.  . Sick sinus syndrome     With DDD St. Jude PM, initially placed in 1993 by Dr. Nils Pyle. Device upgrade 10/2002 to DDD, by Dr. Rollene Fare complicated by bleeding.  [name] Hypertensive cardiovascular disease   . CHF (congestive heart failure)   . Chronic diastolic congestive heart failure     ECHO 05/20/12 LVEF estimated by 2D at 55-60%  . Arthritis   . Shortness of breath   . Gait disorder 03/31/2014  . Diabetic peripheral neuropathy associated with type 2 diabetes mellitus 03/31/2014    Current Outpatient Prescriptions  Medication Sig Dispense Refill  . allopurinol (ZYLOPRIM) 100 MG tablet Take 200 mg by mouth daily.     [name] amiodarone (PACERONE) 200 MG tablet Take 1 tablet (200 mg total) by mouth at bedtime.    . DULoxetine (CYMBALTA) 20 MG capsule Take 1 capsule (20 mg total) by mouth daily. 30 capsule 3  . dutasteride (AVODART) 0.5 MG capsule Take 0.5 mg by mouth daily.     [name] ELIQUIS 2.5 MG TABS tablet take 1 tablet by mouth twice a day 60 tablet 1  . ferrous sulfate 325 (65 FE) MG tablet Take 325 mg by mouth every other day.     . fexofenadine (ALLEGRA) 180 MG tablet Take 180 mg by mouth daily.    [name]  Flaxseed, Linseed, (BL FLAX SEED OIL PO) Take 1 capsule by mouth 2 (two) times daily.    . hydrALAZINE (APRESOLINE) 25 MG tablet Take 25 mg by mouth every 12 (twelve) hours.     . insulin aspart (NOVOLOG FLEXPEN) 100 UNIT/ML FlexPen Inject 2-9 Units into the skin 3 (three) times daily with meals. Sliding scale    . insulin glargine (LANTUS) 100 UNIT/ML injection Inject 9-10 Units into the skin at bedtime.     . isosorbide mononitrate (IMDUR) 60 MG 24 hr tablet Take 60 mg by mouth daily.    . metoprolol tartrate (LOPRESSOR) 25 MG tablet Take 1 tablet (25 mg total) by mouth daily. 30 tablet 6  . Multiple Vitamin (MULTIVITAMIN WITH MINERALS) TABS Take 1 tablet by mouth daily. Diabetic pak by Petra Kuba from [employer]    . nitroGLYCERIN  (NITROSTAT) 0.4 MG SL tablet Place 1 tablet (0.4 mg total) under the tongue every 5 (five) minutes as needed for chest pain. 25 tablet 3  . polyethylene glycol (MIRALAX / GLYCOLAX) packet Take 17 g by mouth 2 (two) times daily as needed for mild constipation. For constipation    . Polyvinyl Alcohol-Povidone (REFRESH OP) Place 1 drop into both eyes 2 (two) times daily. Refresh Gel Opth    . potassium chloride SA (K-DUR,KLOR-CON) 20 MEQ tablet Take 20 mEq by mouth daily.     . silodosin (RAPAFLO) 8 MG CAPS capsule Take 8 mg by mouth daily with breakfast.    . Tafluprost (ZIOPTAN) 0.0015 % SOLN Place 1 drop into both eyes at bedtime. Use at night    . torsemide (DEMADEX) 20 MG tablet Take 4-5 tablets (80-100 mg total) by mouth 2 (two) times daily. Take 5 tabs (100 mg) BID x 2 d, then take 4 tabs (80mg ) bid. 184 tablet 11   No current facility-administered medications for this visit.    Allergies:    Allergies  Allergen Reactions  . Aggrenox [Aspirin-Dipyridamole Er] Other (See Comments)    Dizziness  . Carvedilol Other (See Comments)    Dizziness  . Claritin [Loratadine] Other (See Comments)    Dizziness & drowsiness   . Mucinex [Guaifenesin Er] Other (See Comments)    Dizziness, drowsiness  . Plavix [Clopidogrel Bisulfate] Other (See Comments)    Dizziness  . Rapaflo [Silodosin] Other (See Comments)    Dizziness for about 30- 45  minutes  . Statins Other (See Comments)    rhabdomyolisis  . Travatan Z [Travoprost] Other (See Comments)    Eye irritation    Social History:  The patient  reports that he quit smoking about 55 years ago. His smoking use included Pipe. He has never used smokeless tobacco. He reports that he does not drink alcohol or use illicit drugs.   ROS:  Please see the history of present illness.   Urine flow has been stable without a Foley. He denies orthopnea. There is no edema. Appetite is been stable. No neurological complaints. No chest pain. No chills or fever.    All other systems reviewed and negative.   OBJECTIVE: VS:  BP 122/52 mmHg  Pulse 66  Ht 5\' 7"  (1.702 m)  Wt 187 lb 6.4 oz (85.004 kg)  BMI 29.34 kg/m2  SpO2 96% Well nourished, well developed, in no acute distress, appearing gentleman stated age HEENT: normal Neck: JVD flat. Carotid bruit absent  Cardiac:  normal S1, S2; RRR; no murmur Lungs:  clear to auscultation bilaterally, no wheezing, rhonchi or rales Abd: soft, nontender, no  hepatomegaly Ext: Edema absent. Pulses 2+ Skin: warm and dry Neuro:  CNs 2-12 intact, no focal abnormalities noted  EKG:  Not repeated       Signed, Illene Labrador III, MD 04/16/2014 9:25 AM

## 2014-04-16 ENCOUNTER — Ambulatory Visit (INDEPENDENT_AMBULATORY_CARE_PROVIDER_SITE_OTHER): Payer: Medicare Other | Admitting: Interventional Cardiology

## 2014-04-16 ENCOUNTER — Encounter: Payer: Self-pay | Admitting: Interventional Cardiology

## 2014-04-16 VITALS — BP 122/52 | HR 66 | Ht 67.0 in | Wt 187.4 lb

## 2014-04-16 DIAGNOSIS — Z79899 Other long term (current) drug therapy: Secondary | ICD-10-CM

## 2014-04-16 DIAGNOSIS — Z950 Presence of cardiac pacemaker: Secondary | ICD-10-CM

## 2014-04-16 DIAGNOSIS — I10 Essential (primary) hypertension: Secondary | ICD-10-CM

## 2014-04-16 DIAGNOSIS — I200 Unstable angina: Secondary | ICD-10-CM

## 2014-04-16 DIAGNOSIS — I480 Paroxysmal atrial fibrillation: Secondary | ICD-10-CM

## 2014-04-16 DIAGNOSIS — I5032 Chronic diastolic (congestive) heart failure: Secondary | ICD-10-CM

## 2014-04-16 DIAGNOSIS — Z7901 Long term (current) use of anticoagulants: Secondary | ICD-10-CM

## 2014-04-16 NOTE — Patient Instructions (Signed)
Your physician recommends that you continue on your current medications as directed. Please refer to the Current Medication list given to you today.  Your physician recommends that you return for lab work on 05/18/14 (Bmet)  Your physician recommends that you schedule a follow-up appointment on 05/18/14 @ 9am with Dr.Smith

## 2014-05-18 ENCOUNTER — Encounter: Payer: Self-pay | Admitting: Interventional Cardiology

## 2014-05-18 ENCOUNTER — Ambulatory Visit (INDEPENDENT_AMBULATORY_CARE_PROVIDER_SITE_OTHER): Payer: Medicare Other | Admitting: Interventional Cardiology

## 2014-05-18 VITALS — BP 122/64 | HR 64 | Ht 67.0 in | Wt 188.8 lb

## 2014-05-18 DIAGNOSIS — N184 Chronic kidney disease, stage 4 (severe): Secondary | ICD-10-CM

## 2014-05-18 DIAGNOSIS — Z950 Presence of cardiac pacemaker: Secondary | ICD-10-CM

## 2014-05-18 DIAGNOSIS — I480 Paroxysmal atrial fibrillation: Secondary | ICD-10-CM

## 2014-05-18 DIAGNOSIS — I5032 Chronic diastolic (congestive) heart failure: Secondary | ICD-10-CM

## 2014-05-18 DIAGNOSIS — Z7901 Long term (current) use of anticoagulants: Secondary | ICD-10-CM

## 2014-05-18 DIAGNOSIS — Z79899 Other long term (current) drug therapy: Secondary | ICD-10-CM

## 2014-05-18 DIAGNOSIS — I5033 Acute on chronic diastolic (congestive) heart failure: Secondary | ICD-10-CM

## 2014-05-18 DIAGNOSIS — I10 Essential (primary) hypertension: Secondary | ICD-10-CM

## 2014-05-18 LAB — BASIC METABOLIC PANEL
BUN: 29 mg/dL — AB (ref 6–23)
CHLORIDE: 97 meq/L (ref 96–112)
CO2: 33 meq/L — AB (ref 19–32)
Calcium: 9.2 mg/dL (ref 8.4–10.5)
Creatinine, Ser: 2.5 mg/dL — ABNORMAL HIGH (ref 0.4–1.5)
GFR: 31.24 mL/min — ABNORMAL LOW (ref >60.00)
GLUCOSE: 181 mg/dL — AB (ref 70–99)
POTASSIUM: 4.4 meq/L (ref 3.5–5.1)
Sodium: 138 mEq/L (ref 135–145)

## 2014-05-18 NOTE — Patient Instructions (Signed)
Your physician has recommended you make the following change in your medication:  1) TAKE 100mg  of Demadex this evening and 100mg  tomorrow morning. Call the office in the morning with an update of weight and breathing. [phone]  Lab Today: Bmet  You have a follow up appt scheduled with Dr.Smith on 06/16/14 @ 9am we will repeat a Bmet that day

## 2014-05-18 NOTE — Progress Notes (Signed)
Patient ID: [name], male   DOB: [birth year], 81 y.o.   MRN: [id number]    [address] [place], [place]  [address] Phone: [phone] Fax:  [phone]  Date:  05/18/2014   ID:  [name], DOB 11/15/1918, MRN [id number]  PCP:  [name], MD   ASSESSMENT:  1. Acute on chronic diastolic heart failure, with volume overload 2. Essential hypertension , controlled 3. Chronic kidney disease, stage III-IV 4. Chronic anticoagulation therapy 5. Paroxysmal atrial fibrillation  PLAN:  1. Increase torsemide to 100 mg dose this p.m. and 100 mg dose tomorrow a.m. and call with weights and for further instruction 2. Basic metabolic panel today 3. Clinical follow-up in one month with repeat basic metabolic panel    SUBJECTIVE: AYYAN SITES is a 81 y.o. male since around Christmas he has had increasing dyspnea and fatigue. He feels tired. No chest discomfort. Has had lower extremity swelling. Has some tightness and dyspnea that awakens him from sleep. He has not had syncope. He has been going to work. Some dietary indiscretion over the holidays.   Wt Readings from Last 3 Encounters:  05/18/14 188 lb 12.8 oz (85.639 kg)  04/16/14 187 lb 6.4 oz (85.004 kg)  04/05/14 194 lb (87.998 kg)     Past Medical History  Diagnosis Date  . Diabetes mellitus   . Hyperlipidemia   . Coronary artery disease   . Spinal stenosis   . Pneumonia   . Anemia   . Carotid artery disease   . PVD (peripheral vascular disease)   . Hyponatremia     Chronic  . CVA (cerebral infarction)   . CKD (chronic kidney disease) stage 4, GFR 15-29 ml/min   . Long term (current) use of anticoagulants     No bleeding on Eliquis  . PAF (paroxysmal atrial fibrillation)     Asymptomatic. On Eliquis.  . Sick sinus syndrome     With DDD St. Jude PM, initially placed in 1993 by Dr. Nils Pyle. Device upgrade 10/2002 to DDD, by Dr. Rollene Fare complicated by bleeding.  [name] Hypertensive cardiovascular  disease   . CHF (congestive heart failure)   . Chronic diastolic congestive heart failure     ECHO 05/20/12 LVEF estimated by 2D at 55-60%  . Arthritis   . Shortness of breath   . Gait disorder 03/31/2014  . Diabetic peripheral neuropathy associated with type 2 diabetes mellitus 03/31/2014    Current Outpatient Prescriptions  Medication Sig Dispense Refill  . allopurinol (ZYLOPRIM) 100 MG tablet Take 200 mg by mouth daily.     [name] amiodarone (PACERONE) 200 MG tablet Take 1 tablet (200 mg total) by mouth at bedtime.    . DULoxetine (CYMBALTA) 20 MG capsule Take 1 capsule (20 mg total) by mouth daily. 30 capsule 3  . dutasteride (AVODART) 0.5 MG capsule Take 0.5 mg by mouth daily.     [name] ELIQUIS 2.5 MG TABS tablet take 1 tablet by mouth twice a day 60 tablet 1  . ferrous sulfate 325 (65 FE) MG tablet Take 325 mg by mouth every other day.     . fexofenadine (ALLEGRA) 180 MG tablet Take 180 mg by mouth daily.    . Flaxseed, Linseed, (BL FLAX SEED OIL PO) Take 1 capsule by mouth 2 (two) times daily.    . hydrALAZINE (APRESOLINE) 25 MG tablet Take 25 mg by mouth every 12 (twelve) hours.     . insulin aspart (NOVOLOG FLEXPEN) 100  UNIT/ML FlexPen Inject 2-9 Units into the skin 3 (three) times daily with meals. Sliding scale    . insulin glargine (LANTUS) 100 UNIT/ML injection Inject 9-10 Units into the skin at bedtime.     . isosorbide mononitrate (IMDUR) 60 MG 24 hr tablet Take 60 mg by mouth daily.    . metoprolol tartrate (LOPRESSOR) 25 MG tablet Take 1 tablet (25 mg total) by mouth daily. 30 tablet 6  . Multiple Vitamin (MULTIVITAMIN WITH MINERALS) TABS Take 1 tablet by mouth daily. Diabetic pak by Petra Kuba from [employer]    . nitroGLYCERIN (NITROSTAT) 0.4 MG SL tablet Place 1 tablet (0.4 mg total) under the tongue every 5 (five) minutes as needed for chest pain. 25 tablet 3  . polyethylene glycol (MIRALAX / GLYCOLAX) packet Take 17 g by mouth 2 (two) times daily as needed for mild constipation. For  constipation    . Polyvinyl Alcohol-Povidone (REFRESH OP) Place 1 drop into both eyes 2 (two) times daily. Refresh Gel Opth    . potassium chloride SA (K-DUR,KLOR-CON) 20 MEQ tablet Take 20 mEq by mouth daily.     . silodosin (RAPAFLO) 8 MG CAPS capsule Take 8 mg by mouth daily with breakfast.    . Tafluprost (ZIOPTAN) 0.0015 % SOLN Place 1 drop into both eyes at bedtime. Use at night    . torsemide (DEMADEX) 20 MG tablet Take 4-5 tablets (80-100 mg total) by mouth 2 (two) times daily. Take 5 tabs (100 mg) BID x 2 d, then take 4 tabs (80mg ) bid. 184 tablet 11   No current facility-administered medications for this visit.    Allergies:    Allergies  Allergen Reactions  . Aggrenox [Aspirin-Dipyridamole Er] Other (See Comments)    Dizziness  . Carvedilol Other (See Comments)    Dizziness  . Claritin [Loratadine] Other (See Comments)    Dizziness & drowsiness   . Mucinex [Guaifenesin Er] Other (See Comments)    Dizziness, drowsiness  . Plavix [Clopidogrel Bisulfate] Other (See Comments)    Dizziness  . Rapaflo [Silodosin] Other (See Comments)    Dizziness for about 30- 45  minutes  . Statins Other (See Comments)    rhabdomyolisis  . Travatan Z [Travoprost] Other (See Comments)    Eye irritation    Social History:  The patient  reports that he quit smoking about 56 years ago. His smoking use included Pipe. He has never used smokeless tobacco. He reports that he does not drink alcohol or use illicit drugs.   ROS:  Please see the history of present illness.   [name] feels that he is not doing very well. He has been coughing. He has dyspnea.   All other systems reviewed and negative.   OBJECTIVE: VS:  BP 122/64 mmHg  Pulse 64  Ht 5\' 7"  (1.702 m)  Wt 188 lb 12.8 oz (85.639 kg)  BMI 29.56 kg/m2  SpO2 99% Well nourished, well developed, in no acute distress, moderate obesity HEENT: normal Neck: JVD mildly elevated  Carotid bruit absent  Cardiac:  normal S1, S2; RRR; no  murmur Lungs:  clear to auscultation bilaterally, no wheezing, rhonchi or rales Abd: soft, nontender, no hepatomegaly Ext: Edema trace bilateral. Pulses not evaluated Skin: warm and dry Neuro:  CNs 2-12 intact, no focal abnormalities noted  EKG:  Not performed       Signed, Illene Labrador III, MD 05/18/2014 9:48 AM

## 2014-05-19 ENCOUNTER — Telehealth: Payer: Self-pay | Admitting: Interventional Cardiology

## 2014-05-19 MED ORDER — TORSEMIDE 20 MG PO TABS: ORAL_TABLET | ORAL | Status: DC

## 2014-05-19 NOTE — Telephone Encounter (Signed)
Pt aware of Dr.Smith's recommendation.pt should take Demadex 80mg  in the AM and 70mg  in the PM. Pt medication list updated

## 2014-05-19 NOTE — Telephone Encounter (Signed)
New Message        Pt calling stating he is returning phone call.

## 2014-05-19 NOTE — Telephone Encounter (Signed)
Pt aware of lab results.Kidney function is stable. Pt weigt this morning was 188.7lb with the increase of demadex of 100mg  last night. He has taken 100mg  this morning as instructed at yesterdays o/v.he has tried to reach Dr.Smith on his cell.adv him Dr.Smith is working [facility] cath lab today. I will fwd him an update and call back with his recommendation.

## 2014-05-21 ENCOUNTER — Other Ambulatory Visit: Payer: Self-pay | Admitting: *Deleted

## 2014-05-21 MED ORDER — APIXABAN 2.5 MG PO TABS: 2.5000 mg | ORAL_TABLET | Freq: Two times a day (BID) | ORAL | Status: DC

## 2014-05-27 ENCOUNTER — Emergency Department (HOSPITAL_COMMUNITY): Payer: Medicare Other

## 2014-05-27 ENCOUNTER — Encounter (HOSPITAL_COMMUNITY): Payer: Self-pay | Admitting: Emergency Medicine

## 2014-05-27 ENCOUNTER — Inpatient Hospital Stay (HOSPITAL_COMMUNITY)
Admission: EM | Admit: 2014-05-27 | Discharge: 2014-05-29 | DRG: 292 | Disposition: A | Discharge status: Home / Self Care | Payer: Medicare Other | Attending: Internal Medicine | Admitting: Internal Medicine

## 2014-05-27 DIAGNOSIS — Z96652 Presence of left artificial knee joint: Secondary | ICD-10-CM | POA: Diagnosis present

## 2014-05-27 DIAGNOSIS — Z950 Presence of cardiac pacemaker: Secondary | ICD-10-CM

## 2014-05-27 DIAGNOSIS — E1140 Type 2 diabetes mellitus with diabetic neuropathy, unspecified: Secondary | ICD-10-CM | POA: Diagnosis present

## 2014-05-27 DIAGNOSIS — N400 Enlarged prostate without lower urinary tract symptoms: Secondary | ICD-10-CM | POA: Diagnosis present

## 2014-05-27 DIAGNOSIS — Z888 Allergy status to other drugs, medicaments and biological substances status: Secondary | ICD-10-CM

## 2014-05-27 DIAGNOSIS — R339 Retention of urine, unspecified: Secondary | ICD-10-CM | POA: Diagnosis present

## 2014-05-27 DIAGNOSIS — Z87891 Personal history of nicotine dependence: Secondary | ICD-10-CM

## 2014-05-27 DIAGNOSIS — I129 Hypertensive chronic kidney disease with stage 1 through stage 4 chronic kidney disease, or unspecified chronic kidney disease: Secondary | ICD-10-CM | POA: Diagnosis present

## 2014-05-27 DIAGNOSIS — Z807 Family history of other malignant neoplasms of lymphoid, hematopoietic and related tissues: Secondary | ICD-10-CM

## 2014-05-27 DIAGNOSIS — N289 Disorder of kidney and ureter, unspecified: Secondary | ICD-10-CM

## 2014-05-27 DIAGNOSIS — R531 Weakness: Secondary | ICD-10-CM

## 2014-05-27 DIAGNOSIS — Z8249 Family history of ischemic heart disease and other diseases of the circulatory system: Secondary | ICD-10-CM | POA: Diagnosis not present

## 2014-05-27 DIAGNOSIS — E785 Hyperlipidemia, unspecified: Secondary | ICD-10-CM | POA: Diagnosis present

## 2014-05-27 DIAGNOSIS — I5033 Acute on chronic diastolic (congestive) heart failure: Secondary | ICD-10-CM | POA: Diagnosis present

## 2014-05-27 DIAGNOSIS — Z8673 Personal history of transient ischemic attack (TIA), and cerebral infarction without residual deficits: Secondary | ICD-10-CM | POA: Diagnosis not present

## 2014-05-27 DIAGNOSIS — I495 Sick sinus syndrome: Secondary | ICD-10-CM | POA: Diagnosis present

## 2014-05-27 DIAGNOSIS — Z7901 Long term (current) use of anticoagulants: Secondary | ICD-10-CM | POA: Diagnosis not present

## 2014-05-27 DIAGNOSIS — I10 Essential (primary) hypertension: Secondary | ICD-10-CM | POA: Diagnosis present

## 2014-05-27 DIAGNOSIS — M4800 Spinal stenosis, site unspecified: Secondary | ICD-10-CM | POA: Diagnosis present

## 2014-05-27 DIAGNOSIS — I482 Chronic atrial fibrillation: Secondary | ICD-10-CM | POA: Diagnosis present

## 2014-05-27 DIAGNOSIS — R0902 Hypoxemia: Secondary | ICD-10-CM

## 2014-05-27 DIAGNOSIS — I5023 Acute on chronic systolic (congestive) heart failure: Secondary | ICD-10-CM

## 2014-05-27 DIAGNOSIS — E1142 Type 2 diabetes mellitus with diabetic polyneuropathy: Secondary | ICD-10-CM | POA: Diagnosis present

## 2014-05-27 DIAGNOSIS — N184 Chronic kidney disease, stage 4 (severe): Secondary | ICD-10-CM | POA: Diagnosis present

## 2014-05-27 DIAGNOSIS — F329 Major depressive disorder, single episode, unspecified: Secondary | ICD-10-CM | POA: Diagnosis present

## 2014-05-27 DIAGNOSIS — Z794 Long term (current) use of insulin: Secondary | ICD-10-CM | POA: Diagnosis not present

## 2014-05-27 DIAGNOSIS — I509 Heart failure, unspecified: Secondary | ICD-10-CM

## 2014-05-27 DIAGNOSIS — I739 Peripheral vascular disease, unspecified: Secondary | ICD-10-CM | POA: Diagnosis present

## 2014-05-27 LAB — POTASSIUM: Potassium: 4.1 mmol/L (ref 3.5–5.1)

## 2014-05-27 LAB — TROPONIN I
TROPONIN I: 0.13 ng/mL — AB (ref <0.031)
Troponin I: 0.12 ng/mL — ABNORMAL HIGH (ref <0.031)
Troponin I: 0.14 ng/mL — ABNORMAL HIGH (ref <0.031)

## 2014-05-27 LAB — BASIC METABOLIC PANEL
Anion gap: 11 (ref 5–15)
BUN: 28 mg/dL — ABNORMAL HIGH (ref 6–23)
CALCIUM: 9.5 mg/dL (ref 8.4–10.5)
CO2: 31 mmol/L (ref 19–32)
CREATININE: 2.74 mg/dL — AB (ref 0.50–1.35)
Chloride: 97 mEq/L (ref 96–112)
GFR calc non Af Amer: 19 mL/min — ABNORMAL LOW (ref >90)
GFR, EST AFRICAN AMERICAN: 21 mL/min — AB (ref >90)
Glucose, Bld: 181 mg/dL — ABNORMAL HIGH (ref 70–99)
POTASSIUM: 4.3 mmol/L (ref 3.5–5.1)
Sodium: 139 mmol/L (ref 135–145)

## 2014-05-27 LAB — CBC
HCT: 36.8 % — ABNORMAL LOW (ref 39.0–52.0)
Hemoglobin: 11.9 g/dL — ABNORMAL LOW (ref 13.0–17.0)
MCH: 33.4 pg (ref 26.0–34.0)
MCHC: 32.3 g/dL (ref 30.0–36.0)
MCV: 103.4 fL — AB (ref 78.0–100.0)
PLATELETS: 214 10*3/uL (ref 150–400)
RBC: 3.56 MIL/uL — AB (ref 4.22–5.81)
RDW: 15.4 % (ref 11.5–15.5)
WBC: 6.5 10*3/uL (ref 4.0–10.5)

## 2014-05-27 LAB — GLUCOSE, CAPILLARY
GLUCOSE-CAPILLARY: 174 mg/dL — AB (ref 70–99)
GLUCOSE-CAPILLARY: 186 mg/dL — AB (ref 70–99)
Glucose-Capillary: 156 mg/dL — ABNORMAL HIGH (ref 70–99)
Glucose-Capillary: 212 mg/dL — ABNORMAL HIGH (ref 70–99)

## 2014-05-27 LAB — I-STAT TROPONIN, ED: Troponin i, poc: 0.13 ng/mL (ref 0.00–0.08)

## 2014-05-27 LAB — BRAIN NATRIURETIC PEPTIDE: B Natriuretic Peptide: 465 pg/mL — ABNORMAL HIGH (ref 0.0–100.0)

## 2014-05-27 MED ORDER — TAMSULOSIN HCL 0.4 MG PO CAPS
0.4000 mg | ORAL_CAPSULE | Freq: Every day | ORAL | Status: DC
  Administered 2014-05-27: 0.4 mg via ORAL
  Filled 2014-05-27 (×3): qty 1

## 2014-05-27 MED ORDER — DULOXETINE HCL 20 MG PO CPEP
20.0000 mg | ORAL_CAPSULE | Freq: Every day | ORAL | Status: DC
  Administered 2014-05-27 – 2014-05-29 (×3): 20 mg via ORAL
  Filled 2014-05-27 (×3): qty 1

## 2014-05-27 MED ORDER — METOPROLOL TARTRATE 25 MG PO TABS
25.0000 mg | ORAL_TABLET | Freq: Every day | ORAL | Status: DC
  Administered 2014-05-27 – 2014-05-29 (×3): 25 mg via ORAL
  Filled 2014-05-27 (×3): qty 1

## 2014-05-27 MED ORDER — POTASSIUM CHLORIDE CRYS ER 20 MEQ PO TBCR
20.0000 meq | EXTENDED_RELEASE_TABLET | Freq: Every day | ORAL | Status: DC
  Administered 2014-05-27 – 2014-05-29 (×3): 20 meq via ORAL
  Filled 2014-05-27 (×5): qty 1

## 2014-05-27 MED ORDER — FUROSEMIDE 10 MG/ML IJ SOLN
80.0000 mg | Freq: Four times a day (QID) | INTRAMUSCULAR | Status: AC
  Administered 2014-05-27 – 2014-05-28 (×3): 80 mg via INTRAVENOUS
  Filled 2014-05-27 (×3): qty 8

## 2014-05-27 MED ORDER — FUROSEMIDE 10 MG/ML IJ SOLN
40.0000 mg | Freq: Once | INTRAMUSCULAR | Status: AC
  Administered 2014-05-27: 40 mg via INTRAVENOUS
  Filled 2014-05-27: qty 4

## 2014-05-27 MED ORDER — INSULIN ASPART 100 UNIT/ML FLEXPEN: 2.0000 [IU] | PEN_INJECTOR | Freq: Three times a day (TID) | SUBCUTANEOUS | Status: DC

## 2014-05-27 MED ORDER — ONDANSETRON HCL 4 MG/2ML IJ SOLN: 4.0000 mg | Freq: Four times a day (QID) | INTRAMUSCULAR | Status: DC | PRN

## 2014-05-27 MED ORDER — APIXABAN 2.5 MG PO TABS
2.5000 mg | ORAL_TABLET | Freq: Two times a day (BID) | ORAL | Status: DC
Start: 2014-05-27 — End: 2014-05-29
  Administered 2014-05-27 – 2014-05-29 (×5): 2.5 mg via ORAL
  Filled 2014-05-27 (×6): qty 1

## 2014-05-27 MED ORDER — ALLOPURINOL 100 MG PO TABS
200.0000 mg | ORAL_TABLET | Freq: Every day | ORAL | Status: DC
  Administered 2014-05-27 – 2014-05-29 (×3): 200 mg via ORAL
  Filled 2014-05-27 (×3): qty 2

## 2014-05-27 MED ORDER — LORATADINE 10 MG PO TABS
10.0000 mg | ORAL_TABLET | Freq: Every day | ORAL | Status: DC
  Administered 2014-05-27 – 2014-05-29 (×3): 10 mg via ORAL
  Filled 2014-05-27 (×3): qty 1

## 2014-05-27 MED ORDER — POLYETHYLENE GLYCOL 3350 17 G PO PACK
17.0000 g | PACK | Freq: Two times a day (BID) | ORAL | Status: DC | PRN
  Administered 2014-05-28 – 2014-05-29 (×3): 17 g via ORAL
  Filled 2014-05-27 (×5): qty 1

## 2014-05-27 MED ORDER — SODIUM CHLORIDE 0.9 % IJ SOLN
3.0000 mL | Freq: Two times a day (BID) | INTRAMUSCULAR | Status: DC
  Administered 2014-05-27 – 2014-05-29 (×5): 3 mL via INTRAVENOUS

## 2014-05-27 MED ORDER — SODIUM CHLORIDE 0.9 % IJ SOLN: 3.0000 mL | INTRAMUSCULAR | Status: DC | PRN

## 2014-05-27 MED ORDER — NITROGLYCERIN 0.4 MG SL SUBL: 0.4000 mg | SUBLINGUAL_TABLET | SUBLINGUAL | Status: DC | PRN

## 2014-05-27 MED ORDER — ADULT MULTIVITAMIN W/MINERALS CH
1.0000 | ORAL_TABLET | Freq: Every day | ORAL | Status: DC
  Administered 2014-05-27 – 2014-05-29 (×3): 1 via ORAL
  Filled 2014-05-27 (×3): qty 1

## 2014-05-27 MED ORDER — FUROSEMIDE 10 MG/ML IJ SOLN: 40.0000 mg | Freq: Two times a day (BID) | INTRAMUSCULAR | Status: DC

## 2014-05-27 MED ORDER — LATANOPROST 0.005 % OP SOLN
1.0000 [drp] | Freq: Every day | OPHTHALMIC | Status: DC
  Administered 2014-05-27: 1 [drp] via OPHTHALMIC
  Filled 2014-05-27: qty 2.5

## 2014-05-27 MED ORDER — HYPROMELLOSE (GONIOSCOPIC) 2.5 % OP SOLN
1.0000 [drp] | Freq: Two times a day (BID) | OPHTHALMIC | Status: DC
  Administered 2014-05-27: 1 [drp] via OPHTHALMIC
  Filled 2014-05-27: qty 15

## 2014-05-27 MED ORDER — FERROUS SULFATE 325 (65 FE) MG PO TABS
325.0000 mg | ORAL_TABLET | ORAL | Status: DC
  Administered 2014-05-27: 325 mg via ORAL
  Filled 2014-05-27 (×2): qty 1

## 2014-05-27 MED ORDER — INSULIN GLARGINE 100 UNIT/ML ~~LOC~~ SOLN
10.0000 [IU] | Freq: Every day | SUBCUTANEOUS | Status: DC
  Administered 2014-05-27 – 2014-05-28 (×2): 10 [IU] via SUBCUTANEOUS
  Filled 2014-05-27 (×3): qty 0.1

## 2014-05-27 MED ORDER — INSULIN ASPART 100 UNIT/ML ~~LOC~~ SOLN
0.0000 [IU] | Freq: Three times a day (TID) | SUBCUTANEOUS | Status: DC
  Administered 2014-05-27 (×3): 2 [IU] via SUBCUTANEOUS
  Administered 2014-05-28: 1 [IU] via SUBCUTANEOUS
  Administered 2014-05-28: 2 [IU] via SUBCUTANEOUS
  Administered 2014-05-29: 5 [IU] via SUBCUTANEOUS
  Administered 2014-05-29: 2 [IU] via SUBCUTANEOUS

## 2014-05-27 MED ORDER — ISOSORBIDE MONONITRATE ER 60 MG PO TB24
60.0000 mg | ORAL_TABLET | Freq: Every day | ORAL | Status: DC
  Administered 2014-05-27 – 2014-05-29 (×3): 60 mg via ORAL
  Filled 2014-05-27 (×3): qty 1

## 2014-05-27 MED ORDER — SODIUM CHLORIDE 0.9 % IV SOLN: 250.0000 mL | INTRAVENOUS | Status: DC | PRN

## 2014-05-27 MED ORDER — ACETAMINOPHEN 325 MG PO TABS: 650.0000 mg | ORAL_TABLET | ORAL | Status: DC | PRN

## 2014-05-27 MED ORDER — AMIODARONE HCL 200 MG PO TABS
200.0000 mg | ORAL_TABLET | Freq: Every day | ORAL | Status: DC
  Administered 2014-05-27 – 2014-05-28 (×2): 200 mg via ORAL
  Filled 2014-05-27 (×3): qty 1

## 2014-05-27 MED ORDER — DUTASTERIDE 0.5 MG PO CAPS
0.5000 mg | ORAL_CAPSULE | Freq: Every day | ORAL | Status: DC
  Filled 2014-05-27 (×3): qty 1

## 2014-05-27 MED ORDER — HYDRALAZINE HCL 25 MG PO TABS
25.0000 mg | ORAL_TABLET | Freq: Two times a day (BID) | ORAL | Status: DC
  Administered 2014-05-27 – 2014-05-29 (×5): 25 mg via ORAL
  Filled 2014-05-27 (×6): qty 1

## 2014-05-27 NOTE — H&P (Signed)
[facility] History and Physical  DIRCK BUTCH MEQ:[id number] DOB: [birth year] DOA: 05/27/2014  Referring physician: Dr. Eulis Foster PCP: [name], MD  Specialists: [name], cardiology  Chief Complaint: Shortness of breath  HPI: [name] is a 81 y.o. male  With a history of heart failure, hypertension, atrial fibrillation on anticoagulation, diabetes, presented to the emergency department with complaints of shortness of breath. Patient shortness of breath had been ongoing for approximately one week however over the last several days had been worsening. At home patient was checking his oxygen level and was noted to be in the 74 range. Normally it is 92-94%. Patient does not wear oxygen at home. Of note, patient did see his urologist [facility] 2 days ago for urinary retention and has been using Foley catheter. At this time patient denies any recent illness although does complain of cough. Denies any chest pain, abdominal pain, nausea, vomiting. Patient is a Chief Technology Officer.  In the emergency department, chest x-ray showed small left pleural effusion, concerning for pulmonary edema.  Review of Systems:  Constitutional: Denies fever, chills, diaphoresis, appetite change and fatigue.  HEENT: Denies photophobia, eye pain, redness, hearing loss, ear pain, congestion, sore throat, rhinorrhea, sneezing, mouth sores, trouble swallowing, neck pain, neck stiffness and tinnitus.   Respiratory: Shortness of breath, especially with activity. Cardiovascular: Denies chest pain, palpitations and leg swelling.  Gastrointestinal: Denies nausea, vomiting, abdominal pain, diarrhea, constipation, blood in stool and abdominal distention.  Genitourinary: Denies dysuria, urgency, frequency, hematuria, flank pain and difficulty urinating.  Musculoskeletal: Denies myalgias, back pain, joint swelling, arthralgias and gait problem.  Skin: Denies pallor, rash and wound.  Neurological: Denies dizziness,  seizures, syncope, weakness, light-headedness, numbness and headaches.  Hematological: Denies adenopathy. Easy bruising, personal or family bleeding history  Psychiatric/Behavioral: Denies suicidal ideation, mood changes, confusion, nervousness, sleep disturbance and agitation  Past Medical History  Diagnosis Date  . Diabetes mellitus   . Hyperlipidemia   . Coronary artery disease   . Spinal stenosis   . Pneumonia   . Anemia   . Carotid artery disease   . PVD (peripheral vascular disease)   . Hyponatremia     Chronic  . CVA (cerebral infarction)   . CKD (chronic kidney disease) stage 4, GFR 15-29 ml/min   . Long term (current) use of anticoagulants     No bleeding on Eliquis  . PAF (paroxysmal atrial fibrillation)     Asymptomatic. On Eliquis.  . Sick sinus syndrome     With DDD St. Jude PM, initially placed in 1993 by Dr. Nils Pyle. Device upgrade 10/2002 to DDD, by Dr. Rollene Fare complicated by bleeding.  [name] Hypertensive cardiovascular disease   . CHF (congestive heart failure)   . Chronic diastolic congestive heart failure     ECHO 05/20/12 LVEF estimated by 2D at 55-60%  . Arthritis   . Shortness of breath   . Gait disorder 03/31/2014  . Diabetic peripheral neuropathy associated with type 2 diabetes mellitus 03/31/2014   Past Surgical History  Procedure Laterality Date  . Lumbar laminectomy  1995  . Back surgery    . Total knee arthroplasty Left   . Quadriceps tendon repair    . Cataract extraction    . Carotid endarterectomy    . Yag laser application Right 2/97/9892    Procedure: YAG LASER CAPSULOTOMY OF RIGHT EYE;  Surgeon: [name], MD;  Location: [facility];  Service: Ophthalmology;  Laterality: Right;  . Tonsillectomy    . Pacemaker  insertion      DDD St. Jude PM, initially placed in 1993 by Dr. Nils Pyle. Device upgrade 10/2002 to DDD, by Dr. Rollene Fare complicated by bleeding.  . Carotid endarterectomy Right 2006  . Transurethral resection of prostate    .  Cardioversion N/A 06/16/2013    Procedure: CARDIOVERSION BEDSIDE;  Surgeon: [name], MD;  Location: [facility];  Service: Cardiovascular;  Laterality: N/A;  . Pacemaker generator change  12/09/2009    SJM Acc[employer] DR RF gen change by Dr [name]   Social History:  reports that he quit smoking about 56 years ago. His smoking use included Pipe. He has never used smokeless tobacco. He reports that he does not drink alcohol or use illicit drugs.   Allergies  Allergen Reactions  . Aggrenox [Aspirin-Dipyridamole Er] Other (See Comments)    Dizziness  . Carvedilol Other (See Comments)    Dizziness  . Claritin [Loratadine] Other (See Comments)    Dizziness & drowsiness   . Mucinex [Guaifenesin Er] Other (See Comments)    Dizziness, drowsiness  . Plavix [Clopidogrel Bisulfate] Other (See Comments)    Dizziness  . Rapaflo [Silodosin] Other (See Comments)    Dizziness for about 30- 45  minutes  . Statins Other (See Comments)    rhabdomyolisis  . Travatan Z [Travoprost] Other (See Comments)    Eye irritation    Family History  Problem Relation Age of Onset  . Multiple myeloma Father   . Hypertension Mother     Prior to Admission medications   Medication Sig Start Date End Date Taking? Authorizing Provider  allopurinol (ZYLOPRIM) 100 MG tablet Take 200 mg by mouth daily.     Historical Provider, MD  amiodarone (PACERONE) 200 MG tablet Take 1 tablet (200 mg total) by mouth at bedtime. 01/01/14   Bary Leriche, PA-C  apixaban (ELIQUIS) 2.5 MG TABS tablet Take 1 tablet (2.5 mg total) by mouth 2 (two) times daily. 05/21/14   Belva Crome III, MD  DULoxetine (CYMBALTA) 20 MG capsule Take 1 capsule (20 mg total) by mouth daily. 03/31/14   Kathrynn Ducking, MD  dutasteride (AVODART) 0.5 MG capsule Take 0.5 mg by mouth daily.     Historical Provider, MD  ferrous sulfate 325 (65 FE) MG tablet Take 325 mg by mouth every other day.     Historical Provider, MD  fexofenadine (ALLEGRA) 180 MG tablet Take  180 mg by mouth daily.    Historical Provider, MD  Flaxseed, Linseed, (BL FLAX SEED OIL PO) Take 1 capsule by mouth 2 (two) times daily.    Historical Provider, MD  hydrALAZINE (APRESOLINE) 25 MG tablet Take 25 mg by mouth every 12 (twelve) hours.     Historical Provider, MD  insulin aspart (NOVOLOG FLEXPEN) 100 UNIT/ML FlexPen Inject 2-9 Units into the skin 3 (three) times daily with meals. Sliding scale    Historical Provider, MD  insulin glargine (LANTUS) 100 UNIT/ML injection Inject 9-10 Units into the skin at bedtime.     Historical Provider, MD  isosorbide mononitrate (IMDUR) 60 MG 24 hr tablet Take 60 mg by mouth daily. 01/01/14   Bary Leriche, PA-C  metoprolol tartrate (LOPRESSOR) 25 MG tablet Take 1 tablet (25 mg total) by mouth daily. 11/12/13   Belva Crome III, MD  Multiple Vitamin (MULTIVITAMIN WITH MINERALS) TABS Take 1 tablet by mouth daily. Diabetic pak by Petra Kuba from [place] Provider, MD  nitroGLYCERIN (NITROSTAT) 0.4 MG SL tablet Place  1 tablet (0.4 mg total) under the tongue every 5 (five) minutes as needed for chest pain. 12/03/13   Belva Crome III, MD  polyethylene glycol [facility] / Floria Raveling) packet Take 17 g by mouth 2 (two) times daily as needed for mild constipation. For constipation    Historical Provider, MD  Polyvinyl Alcohol-Povidone (REFRESH OP) Place 1 drop into both eyes 2 (two) times daily. Refresh Gel Opth    Historical Provider, MD  potassium chloride SA (K-DUR,KLOR-CON) 20 MEQ tablet Take 20 mEq by mouth daily.  01/15/14   Historical Provider, MD  silodosin (RAPAFLO) 8 MG CAPS capsule Take 8 mg by mouth daily with breakfast.    Historical Provider, MD  Tafluprost (ZIOPTAN) 0.0015 % SOLN Place 1 drop into both eyes at bedtime. Use at night    Historical Provider, MD  torsemide (DEMADEX) 20 MG tablet Take 43m in the AM and 74min the PM 05/19/14   HeSinclair GroomsMD   Physical Exam: Filed Vitals:   05/27/14 0700  BP: 134/73  Pulse: 70  Temp: 97.3 F  (36.3 C)  Resp: 18     General: Well developed, well nourished, NAD, appears stated age  HEENT: NCAT, mucous membranes moist.   Neck: Supple, no JVD, no masses  Cardiovascular: S1 S2 auscultated, no rubs, murmurs or gallops. Regular rate and rhythm.  Respiratory: Diminished breath sounds at lung bases, +rales  Abdomen: Soft, nontender, nondistended, + bowel sounds  Extremities: warm dry without cyanosis clubbing. +2 edema in LE B/L  Neuro: AAOx3, however, very somnolent, cranial nerves grossly intact.   Skin: Without rashes exudates or nodules  Labs on Admission:  Basic Metabolic Panel:  Recent Labs Lab 05/27/14 0421  NA 139  K 4.3  CL 97  CO2 31  GLUCOSE 181*  BUN 28*  CREATININE 2.74*  CALCIUM 9.5   Liver Function Tests: No results for input(s): AST, ALT, ALKPHOS, BILITOT, PROT, ALBUMIN in the last 168 hours. No results for input(s): LIPASE, AMYLASE in the last 168 hours. No results for input(s): AMMONIA in the last 168 hours. CBC:  Recent Labs Lab 05/27/14 0421  WBC 6.5  HGB 11.9*  HCT 36.8*  MCV 103.4*  PLT 214   Cardiac Enzymes:  Recent Labs Lab 05/27/14 0512  TROPONINI 0.12*    BNP (last 3 results)  Recent Labs  12/22/13 1504 12/23/13 0535 04/04/14 0742  PROBNP 3984.0* 4564.0* 4874.0*   CBG:  Recent Labs Lab 05/27/14 0701  GLUCAP 156*    Radiological Exams on Admission: Dg Chest Port 1 View  05/27/2014   CLINICAL DATA:  Acute onset of shortness of breath. Initial encounter.  EXAM: PORTABLE CHEST - 1 VIEW  COMPARISON:  Chest radiograph performed 04/04/2014  FINDINGS: The lungs are well-aerated. A small left pleural effusion is noted, with bibasilar airspace opacification, concerning for pulmonary edema. This is mildly worsened from the prior study. No pneumothorax is seen.  The cardiomediastinal silhouette is borderline normal in size. A pacemaker is noted overlying the left chest wall, with leads ending overlying the right atrium  and right ventricle. No acute osseous abnormalities are seen.  IMPRESSION: Small left pleural effusion noted, with bibasilar airspace opacification, concerning for pulmonary edema. This is mildly worsened from the prior study.   Electronically Signed   By: JeGarald Balding.D.   On: 05/27/2014 05:00    EKG: Independently reviewed. Atrially paced, rate 64  Assessment/Plan  Acute Combined Systolic and Diastolic CHF exacerbation -Patient admitted to  telemetry -Last echocardiogram: August 2015 shows an EF 45-50%, severe pulmonary hypertension PASP 73 mmHg -BNP 465 -CXR: small left pleural effusion, concerning for pulmonary edema. -Will hold demadex -Place on lasix 69m IV BID -Monitor daily weights, intake and output -Will consult cardiology (per family)  Mildly elevated troponin -Likely secondary to the above, will continue to monitor troponins every 6 hours -Patient currently chest pain-free  Atrial fibrillation -Currently rate and rhythm controlled -Continue Eliquis and amiodarone, metoprolol  -has PM  History of sick sinus syndrome  -Has St. Jude's pacemaker   Chronic kidney disease, stage IV -Will continue to monitor creatinine as patient will be receiving Lasix   Urinary retention with BPH  -Continue Foley catheter and Avodart   Diabetes mellitus, type 2 -Continue home regimen with sliding insulin scale and CBG monitoring   Hypertension -Continue home medications: Hydralazine, metoprolol, Imdur  Depression -Continue Cymbalta    DVT prophylaxis: Eliquis  Code Status: Full  Condition: Guarded  Family Communication: Family at bedside. Admission, patients condition and plan of care including tests being ordered have been discussed with the patient and family who indicate understanding and agree with the plan and Code Status.  Disposition Plan: Admitted   Time spent:60 minutes  Adisynn Suleiman D.O. [facility] Pager [phone] If 7PM-7AM, please contact  night-coverage [email or web address] Password TThe [facility]1/14/2016, 7:35 AM

## 2014-05-27 NOTE — ED Notes (Signed)
Pt from home with hx of CHF. Pt was awakened by sudden sob. Pt denies any cp. Pt states he has not been sleeping on any more pillows than usually but has recently started sleeping on a wedge the last month. Per family pt was unable to weigh himself today due to being too out of breathe to get on scale. Pt has increased sob with exertion.

## 2014-05-27 NOTE — Consult Note (Addendum)
Name: [name] is a 81 y.o. male Admit date: 05/27/2014 Referring Physician:  Burnis Medin, D.O. Primary Physician:  Jeanann Lewandowsky Primary Cardiologist:  Helyn Numbers, M.D.  Reason for Consultation:  CHF  ASSESSMENT: 1. Acute combined systolic and diastolic heart failure. Precipitants could include fluid retention secondary to urinary retention, dietary indiscretion, and underlying severe diastolic dysfunction. Renal impairment is also involved with varying urinary output with increasing resistance to diuretic therapy. 2. Stage IV chronic kidney disease 3. Paroxysmal atrial fibrillation with rhythm control on amiodarone. Currently atrial pacing 4. Chronic anticoagulation 5. Essential hypertension 6. Type 2 diabetes, complicated with end organ involvement including neuropathy, vascular, and kidney impairment. 7. Recurrent urinary retention  PLAN:  1. Aggressive IV diuresis. I would recommend 80 mg of furosemide every 8 hours initially for at least 3 doses. 2. Follow kidney function closely 3. Multiple discussions in the past have resulted continuation of full CODE STATUS. 4. Continue anticoagulation 5. 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 He was seen in the office in 9 days ago and was treated with intensified diuretic regimen (Demadex 100 mg by mouth twice a day) because of increased weight and dyspnea. After 2 doses the patient was back to baseline. He was converted back to his usual dose of 80 mg of Demadex in the morning and 70 mg in the evening. He did well for 3-4 days and began having a gradual recurrence of increasing shortness of breath. He became concerned last evening when he noted oxygen saturations that were in the 80s. He denies chest pain. Within the past 48 hours he has had urinary retention requiring placement of a Foley  catheter by Dr. Janice Norrie. He denies angina, edema, and neurological complaints.  PMH:   Past Medical History  Diagnosis Date  . Diabetes mellitus   . Hyperlipidemia   . Coronary artery disease   . Spinal stenosis   . Pneumonia   . Anemia   . Carotid artery disease   . PVD (peripheral vascular disease)   . Hyponatremia     Chronic  . CVA (cerebral infarction)   . CKD (chronic kidney disease) stage 4, GFR 15-29 ml/min   . Long term (current) use of anticoagulants     No bleeding on Eliquis  . PAF (paroxysmal atrial fibrillation)     Asymptomatic. On Eliquis.  . Sick sinus syndrome     With DDD St. Jude PM, initially placed in 1993 by Dr. Nils Pyle. Device upgrade 10/2002 to DDD, by Dr. Rollene Fare complicated by bleeding.  [name] Hypertensive cardiovascular disease   . CHF (congestive heart failure)   . Chronic diastolic congestive heart failure     ECHO 05/20/12 LVEF estimated by 2D at 55-60%  . Arthritis   . Shortness of breath   . Gait disorder 03/31/2014  . Diabetic peripheral neuropathy associated with type 2 diabetes mellitus 03/31/2014    PSH:   Past Surgical History  Procedure Laterality Date  . Lumbar laminectomy  1995  . Back surgery    . Total knee arthroplasty Left   . Quadriceps tendon repair    . Cataract extraction    . Carotid endarterectomy    . Yag laser application Right 2/62/0355    Procedure: YAG LASER CAPSULOTOMY OF RIGHT EYE;  Surgeon: [name], MD;  Location: [place];  Service: Ophthalmology;  Laterality: Right;  . Tonsillectomy    . Pacemaker insertion      DDD St. Jude PM, initially placed in 1993 by Dr. Nils Pyle. Device upgrade 10/2002 to DDD, by Dr. Rollene Fare complicated by bleeding.  . Carotid endarterectomy Right 2006  . Transurethral resection of prostate    . Cardioversion N/A 06/16/2013    Procedure: CARDIOVERSION BEDSIDE;  Surgeon: [name], MD;  Location: [place];  Service: Cardiovascular;  Laterality: N/A;  . Pacemaker generator  change  12/09/2009    SJM Accent DR RF gen change by Dr [name]   Allergies:  Aggrenox; Carvedilol; Claritin; Mucinex; Plavix; Rapaflo; Statins; and Travatan z Prior to Admit Meds:   Prescriptions prior to admission  Medication Sig Dispense Refill Last Dose  . allopurinol (ZYLOPRIM) 100 MG tablet Take 200 mg by mouth daily.    Taking  . amiodarone (PACERONE) 200 MG tablet Take 1 tablet (200 mg total) by mouth at bedtime.   Taking  . apixaban (ELIQUIS) 2.5 MG TABS tablet Take 1 tablet (2.5 mg total) by mouth 2 (two) times daily. 60 tablet 5   . DULoxetine (CYMBALTA) 20 MG capsule Take 1 capsule (20 mg total) by mouth daily. 30 capsule 3 Taking  . dutasteride (AVODART) 0.5 MG capsule Take 0.5 mg by mouth daily.    Taking  . ferrous sulfate 325 (65 FE) MG tablet Take 325 mg by mouth every other day.    Taking  . fexofenadine (ALLEGRA) 180 MG tablet Take 180 mg by mouth daily.   Taking  . Flaxseed, Linseed, (BL FLAX SEED OIL PO) Take 1 capsule by mouth 2 (two) times daily.   Taking  . hydrALAZINE (APRESOLINE) 25 MG tablet Take 25 mg by mouth every 12 (twelve) hours.    Taking  . insulin aspart (NOVOLOG FLEXPEN) 100 UNIT/ML FlexPen Inject 2-9 Units into the skin 3 (three) times daily with meals. Sliding scale   Taking  . insulin glargine (LANTUS) 100 UNIT/ML injection Inject 9-10 Units into the skin at bedtime.    Taking  . isosorbide mononitrate (IMDUR) 60 MG 24 hr tablet Take 60 mg by mouth daily.   Taking  . metoprolol tartrate (LOPRESSOR) 25 MG tablet Take 1 tablet (25 mg total) by mouth daily. 30 tablet 6 Taking  . Multiple Vitamin (MULTIVITAMIN WITH MINERALS) TABS Take 1 tablet by mouth daily. Diabetic pak by Petra Kuba from [employer]  . nitroGLYCERIN (NITROSTAT) 0.4 MG SL tablet Place 1 tablet (0.4 mg total) under the tongue every 5 (five) minutes as needed for chest pain. 25 tablet 3 Taking  . polyethylene glycol (MIRALAX / GLYCOLAX) packet Take 17 g by mouth 2 (two) times daily as needed  for mild constipation. For constipation   Taking  . Polyvinyl Alcohol-Povidone (REFRESH OP) Place 1 drop into both eyes 2 (two) times daily. Refresh Gel Opth   Taking  . potassium chloride SA (K-DUR,KLOR-CON) 20 MEQ tablet Take 20 mEq by mouth daily.    Taking  . silodosin (RAPAFLO) 8 MG CAPS capsule Take 8 mg by mouth daily with breakfast.   Taking  . Tafluprost (ZIOPTAN) 0.0015 % SOLN Place 1 drop into both eyes at bedtime. Use at night   Taking  . torsemide (DEMADEX) 20 MG tablet Take 10m in the AM and 738min the PM      Fam HX:    Family History  Problem Relation Age of Onset  . Multiple myeloma Father   . Hypertension Mother  Social HX:    History   Social History  . Marital Status: Widowed    Spouse Name: N/A    Number of Children: 7  . Years of Education: N/A   Occupational History  . MD    Social History Main Topics  . Smoking status: Former Smoker -- 2 years    Types: Pipe    Quit date: 05/14/1958  . Smokeless tobacco: Never Used  . Alcohol Use: No     Comment: Cocktails occasionally  . Drug Use: No  . Sexual Activity: Not Currently   Other Topics Concern  . Not on file   Social History Narrative   Has a caregiver that lives with him. Still works regularly as a Immunologist     Review of Systems: Advocates compliance with diet. Denies angina. Denies syncope and palpitations. No lower extremity swelling. Has had orthopnea. No blood in the urine or stool. Difficulty with urinary retention recently as mentioned above.  Physical Exam: Blood pressure 134/73, pulse 70, temperature 97.3 F (36.3 C), temperature source Axillary, resp. rate 18, height '5\' 7"'  (1.702 m), weight 206 lb 5.6 oz (93.6 kg), SpO2 100 %. Weight change:   Sitting, eating breakfast, his youngest son Nickoles is present. He is in no distress. Skin is clear without evidence of jaundice or pallor HEENT exam reveals no evidence of jaundice or pallor Neck exam reveals no obvious JVD  with patient sitting at 90. Chest reveals decreased breath sounds at both bases with faint rales in the midlung fields bilateral Cardiac exam reveals no rub or gallop. Abdomen is soft. Extremities reveal trace ankle edema bilateral. Patient has a Foley draining from his penis. Neuro exam reveals patient is alert and oriented. No focal motor deficits are noted. No tremors noted. Leg strength is better this morning than it was earlier last night   Labs: Lab Results  Component Value Date   WBC 6.5 05/27/2014   HGB 11.9* 05/27/2014   HCT 36.8* 05/27/2014   MCV 103.4* 05/27/2014   PLT 214 05/27/2014    Recent Labs Lab 05/27/14 0421  NA 139  K 4.3  CL 97  CO2 31  BUN 28*  CREATININE 2.74*  CALCIUM 9.5  GLUCOSE 181*   No results found for: PTT Lab Results  Component Value Date   INR 1.01 08/21/2012   INR 0.98 08/18/2012   INR 1.00 02/29/2012   Lab Results  Component Value Date   CKTOTAL 164 12/03/2013   CKMB 3.3 05/26/2011   TROPONINI 0.12* 05/27/2014    Radiology:  Dg Chest Port 1 View  05/27/2014   CLINICAL DATA:  Acute onset of shortness of breath. Initial encounter.  EXAM: PORTABLE CHEST - 1 VIEW  COMPARISON:  Chest radiograph performed 04/04/2014  FINDINGS: The lungs are well-aerated. A small left pleural effusion is noted, with bibasilar airspace opacification, concerning for pulmonary edema. This is mildly worsened from the prior study. No pneumothorax is seen.  The cardiomediastinal silhouette is borderline normal in size. A pacemaker is noted overlying the left chest wall, with leads ending overlying the right atrium and right ventricle. No acute osseous abnormalities are seen.  IMPRESSION: Small left pleural effusion noted, with bibasilar airspace opacification, concerning for pulmonary edema. This is mildly worsened from the prior study.   Electronically Signed   By: Garald Balding M.D.   On: 05/27/2014 05:00    EKG:  Atrial pacing    [name] 05/27/2014 8:54 AM

## 2014-05-27 NOTE — Progress Notes (Addendum)
MD paged, pt received iv lasix 0538 and has order for additional dose at 0830, a/w call back. Sherrie Mustache 8:37 AM   MD request to give lasix around noon, will contact pharmacy to change. 8:38 AM

## 2014-05-27 NOTE — Progress Notes (Addendum)
MD paged, pt c/o jerking in arms. Kathleen Argue S 1:36 PM   K+ level drawn and resulted, MD paged with results. Sherrie Mustache 4:11 PM

## 2014-05-27 NOTE — ED Provider Notes (Signed)
CSN: [id number]     Arrival date & time 05/27/14  0346 History   First MD Initiated Contact with Patient 05/27/14 0441     Chief Complaint  Patient presents with  . Shortness of Breath     (Consider location/radiation/quality/duration/timing/severity/associated sxs/prior Treatment) HPI   FIORE DETJEN is a 81 y.o. male who presents for evaluation of shortness of breath, dyspnea on exertion and tremor in both hands, for several days.  He feels like his weight is staying the same, "about 80 kg."  He is taking his medications as prescribed.  He does not use oxygen at home.  He saw his urologist 2 days ago for urinary retention, and has been wearing a Foley catheter, since then.  There is been no fever, chills, nausea, vomiting or diarrhea.  He is eating well.  He denies chest pain or back pain.  At home, his oxygen level was "80."  He is transferred here by EMS, on oxygen. There are no other known modifying factors.   Past Medical History  Diagnosis Date  . Diabetes mellitus   . Hyperlipidemia   . Coronary artery disease   . Spinal stenosis   . Pneumonia   . Anemia   . Carotid artery disease   . PVD (peripheral vascular disease)   . Hyponatremia     Chronic  . CVA (cerebral infarction)   . CKD (chronic kidney disease) stage 4, GFR 15-29 ml/min   . Long term (current) use of anticoagulants     No bleeding on Eliquis  . PAF (paroxysmal atrial fibrillation)     Asymptomatic. On Eliquis.  . Sick sinus syndrome     With DDD St. Jude PM, initially placed in 1993 by Dr. Nils Pyle. Device upgrade 10/2002 to DDD, by Dr. Rollene Fare complicated by bleeding.  [name] Hypertensive cardiovascular disease   . CHF (congestive heart failure)   . Chronic diastolic congestive heart failure     ECHO 05/20/12 LVEF estimated by 2D at 55-60%  . Arthritis   . Shortness of breath   . Gait disorder 03/31/2014  . Diabetic peripheral neuropathy associated with type 2 diabetes mellitus 03/31/2014   Past Surgical  History  Procedure Laterality Date  . Lumbar laminectomy  1995  . Back surgery    . Total knee arthroplasty Left   . Quadriceps tendon repair    . Cataract extraction    . Carotid endarterectomy    . Yag laser application Right 8/46/6599    Procedure: YAG LASER CAPSULOTOMY OF RIGHT EYE;  Surgeon: [name], MD;  Location: [place];  Service: Ophthalmology;  Laterality: Right;  . Tonsillectomy    . Pacemaker insertion      DDD St. Jude PM, initially placed in 1993 by Dr. Nils Pyle. Device upgrade 10/2002 to DDD, by Dr. Rollene Fare complicated by bleeding.  . Carotid endarterectomy Right 2006  . Transurethral resection of prostate    . Cardioversion N/A 06/16/2013    Procedure: CARDIOVERSION BEDSIDE;  Surgeon: [name], MD;  Location: [place];  Service: Cardiovascular;  Laterality: N/A;  . Pacemaker generator change  12/09/2009    SJM Accent DR RF gen change by Dr [name]   Family History  Problem Relation Age of Onset  . Multiple myeloma Father   . Hypertension Mother    History  Substance Use Topics  . Smoking status: Former Smoker -- 2 years    Types: Pipe    Quit date: 05/14/1958  . Smokeless tobacco:  Never Used  . Alcohol Use: No     Comment: Cocktails occasionally    Review of Systems  All other systems reviewed and are negative.     Allergies  Aggrenox; Carvedilol; Claritin; Mucinex; Plavix; Rapaflo; Statins; and Travatan z  Home Medications   Prior to Admission medications   Medication Sig Start Date End Date Taking? Authorizing Provider  allopurinol (ZYLOPRIM) 100 MG tablet Take 200 mg by mouth daily.     Historical Provider, MD  amiodarone (PACERONE) 200 MG tablet Take 1 tablet (200 mg total) by mouth at bedtime. 01/01/14   Bary Leriche, PA-C  apixaban (ELIQUIS) 2.5 MG TABS tablet Take 1 tablet (2.5 mg total) by mouth 2 (two) times daily. 05/21/14   Belva Crome III, MD  DULoxetine (CYMBALTA) 20 MG capsule Take 1 capsule (20 mg total) by mouth  daily. 03/31/14   Kathrynn Ducking, MD  dutasteride (AVODART) 0.5 MG capsule Take 0.5 mg by mouth daily.     Historical Provider, MD  ferrous sulfate 325 (65 FE) MG tablet Take 325 mg by mouth every other day.     Historical Provider, MD  fexofenadine (ALLEGRA) 180 MG tablet Take 180 mg by mouth daily.    Historical Provider, MD  Flaxseed, Linseed, (BL FLAX SEED OIL PO) Take 1 capsule by mouth 2 (two) times daily.    Historical Provider, MD  hydrALAZINE (APRESOLINE) 25 MG tablet Take 25 mg by mouth every 12 (twelve) hours.     Historical Provider, MD  insulin aspart (NOVOLOG FLEXPEN) 100 UNIT/ML FlexPen Inject 2-9 Units into the skin 3 (three) times daily with meals. Sliding scale    Historical Provider, MD  insulin glargine (LANTUS) 100 UNIT/ML injection Inject 9-10 Units into the skin at bedtime.     Historical Provider, MD  isosorbide mononitrate (IMDUR) 60 MG 24 hr tablet Take 60 mg by mouth daily. 01/01/14   Bary Leriche, PA-C  metoprolol tartrate (LOPRESSOR) 25 MG tablet Take 1 tablet (25 mg total) by mouth daily. 11/12/13   Belva Crome III, MD  Multiple Vitamin (MULTIVITAMIN WITH MINERALS) TABS Take 1 tablet by mouth daily. Diabetic pak by Petra Kuba from [place] Provider, MD  nitroGLYCERIN (NITROSTAT) 0.4 MG SL tablet Place 1 tablet (0.4 mg total) under the tongue every 5 (five) minutes as needed for chest pain. 12/03/13   Belva Crome III, MD  polyethylene glycol [facility] / Floria Raveling) packet Take 17 g by mouth 2 (two) times daily as needed for mild constipation. For constipation    Historical Provider, MD  Polyvinyl Alcohol-Povidone (REFRESH OP) Place 1 drop into both eyes 2 (two) times daily. Refresh Gel Opth    Historical Provider, MD  potassium chloride SA (K-DUR,KLOR-CON) 20 MEQ tablet Take 20 mEq by mouth daily.  01/15/14   Historical Provider, MD  silodosin (RAPAFLO) 8 MG CAPS capsule Take 8 mg by mouth daily with breakfast.    Historical Provider, MD  Tafluprost (ZIOPTAN) 0.0015  % SOLN Place 1 drop into both eyes at bedtime. Use at night    Historical Provider, MD  torsemide (DEMADEX) 20 MG tablet Take 42m in the AM and 761min the PM 05/19/14   HeBelva CromeII, MD   BP 141/72 mmHg  Pulse 64  Temp(Src) 97.8 F (36.6 C) (Oral)  Resp 25  SpO2 100% Physical Exam  Constitutional: He is oriented to person, place, and time. He appears well-developed and well-nourished.  HENT:  Head:  Normocephalic and atraumatic.  Right Ear: External ear normal.  Left Ear: External ear normal.  Eyes: Conjunctivae and EOM are normal. Pupils are equal, round, and reactive to light.  Neck: Normal range of motion and phonation normal. Neck supple.  Cardiovascular: Normal rate, regular rhythm and normal heart sounds.   No JVD  Pulmonary/Chest: Effort normal and breath sounds normal. He exhibits no bony tenderness.  Abdominal: Soft. There is no tenderness.  Musculoskeletal: Normal range of motion. He exhibits no tenderness.  Neurological: He is alert and oriented to person, place, and time. No cranial nerve deficit or sensory deficit. He exhibits normal muscle tone. Coordination normal.  Skin: Skin is warm, dry and intact.  Psychiatric: He has a normal mood and affect. His behavior is normal. Judgment and thought content normal.  Nursing note and vitals reviewed.   ED Course  Procedures (including critical care time)  Medications  furosemide (LASIX) injection 40 mg (not administered)    Patient Vitals for the past 24 hrs:  BP Temp Temp src Pulse Resp SpO2  05/27/14 0415 - - - - - 100 %  05/27/14 0350 141/72 mmHg 97.8 F (36.6 C) Oral 64 25 100 %    5:18 AM Reevaluation with update and discussion. After initial assessment and treatment, an updated evaluation reveals no additional complaints, findings discussed with patient, family members, all questions answered.Daleen Bo L   5:37 AM-Consult complete with Dr. Blaine Hamper. Patient case explained and discussed. He agrees to admit  patient for further evaluation and treatment. Call ended at [address] Reviewed  CBC - Abnormal; Notable for the following:    RBC 3.56 (*)    Hemoglobin 11.9 (*)    HCT 36.8 (*)    MCV 103.4 (*)    All other components within normal limits  BASIC METABOLIC PANEL - Abnormal; Notable for the following:    Glucose, Bld 181 (*)    BUN 28 (*)    Creatinine, Ser 2.74 (*)    GFR calc non Af Amer 19 (*)    GFR calc Af Amer 21 (*)    All other components within normal limits  I-STAT TROPOININ, ED - Abnormal; Notable for the following:    Troponin i, poc 0.13 (*)    All other components within normal limits  BRAIN NATRIURETIC PEPTIDE    Imaging Review Dg Chest Port 1 View  05/27/2014   CLINICAL DATA:  Acute onset of shortness of breath. Initial encounter.  EXAM: PORTABLE CHEST - 1 VIEW  COMPARISON:  Chest radiograph performed 04/04/2014  FINDINGS: The lungs are well-aerated. A small left pleural effusion is noted, with bibasilar airspace opacification, concerning for pulmonary edema. This is mildly worsened from the prior study. No pneumothorax is seen.  The cardiomediastinal silhouette is borderline normal in size. A pacemaker is noted overlying the left chest wall, with leads ending overlying the right atrium and right ventricle. No acute osseous abnormalities are seen.  IMPRESSION: Small left pleural effusion noted, with bibasilar airspace opacification, concerning for pulmonary edema. This is mildly worsened from the prior study.   Electronically Signed   By: Garald Balding M.D.   On: 05/27/2014 05:00     EKG Interpretation   Date/Time:  Thursday May 27 2014 04:44:19 EST Ventricular Rate:  64 PR Interval:  218 QRS Duration: 114 QT Interval:  482 QTC Calculation: 497 R Axis:   -23 Text Interpretation:  Atrial-paced rhythm Borderline intraventricular  conduction delay Low voltage, extremity leads Nonspecific  T abnormalities,  lateral leads Borderline prolonged QT  interval Baseline wander in lead(s)  V5 since last tracing no significant change Confirmed by [facility]  MD,  Mckenley Birenbaum [phone]) on 05/27/2014 7:36:44 AM      MDM   Final diagnoses:  Acute on chronic systolic congestive heart failure  Renal insufficiency  Weakness  Hypoxia    Evaluation consistent with exacerbation, CHF.  Mild chronic elevation of troponin I.  Doubt ACS, PE or pneumonia.  Patient needs to be admitted for treatment of hypoxia and mild congestive heart failure.  Cardiac rhythm is paced atrial.  He is on chronic Eliquis therapy.  Nursing Notes Reviewed/ Care Coordinated, and agree without changes. Applicable Imaging Reviewed.  Interpretation of Laboratory Data incorporated into ED treatment  Plan: Admit    Richarda Blade, MD 05/28/14 [phone]

## 2014-05-27 NOTE — Progress Notes (Signed)
Patient admitted to floor at 6:53 from ED with son and friend. Patient alert and oriented X4.  Patient arrived with foley from home. Notified oncoming nurse, that I paged Admissions to notify of arrival and for future orders.

## 2014-05-28 DIAGNOSIS — I509 Heart failure, unspecified: Secondary | ICD-10-CM

## 2014-05-28 DIAGNOSIS — E1140 Type 2 diabetes mellitus with diabetic neuropathy, unspecified: Secondary | ICD-10-CM

## 2014-05-28 DIAGNOSIS — I10 Essential (primary) hypertension: Secondary | ICD-10-CM

## 2014-05-28 DIAGNOSIS — I5043 Acute on chronic combined systolic (congestive) and diastolic (congestive) heart failure: Secondary | ICD-10-CM

## 2014-05-28 DIAGNOSIS — R339 Retention of urine, unspecified: Secondary | ICD-10-CM

## 2014-05-28 LAB — BASIC METABOLIC PANEL
ANION GAP: 11 (ref 5–15)
BUN: 29 mg/dL — AB (ref 6–23)
CO2: 32 mmol/L (ref 19–32)
Calcium: 9 mg/dL (ref 8.4–10.5)
Chloride: 95 mEq/L — ABNORMAL LOW (ref 96–112)
Creatinine, Ser: 2.26 mg/dL — ABNORMAL HIGH (ref 0.50–1.35)
GFR calc non Af Amer: 23 mL/min — ABNORMAL LOW (ref >90)
GFR, EST AFRICAN AMERICAN: 27 mL/min — AB (ref >90)
GLUCOSE: 129 mg/dL — AB (ref 70–99)
POTASSIUM: 3.5 mmol/L (ref 3.5–5.1)
Sodium: 138 mmol/L (ref 135–145)

## 2014-05-28 LAB — GLUCOSE, CAPILLARY
GLUCOSE-CAPILLARY: 136 mg/dL — AB (ref 70–99)
GLUCOSE-CAPILLARY: 187 mg/dL — AB (ref 70–99)
GLUCOSE-CAPILLARY: 196 mg/dL — AB (ref 70–99)
Glucose-Capillary: 176 mg/dL — ABNORMAL HIGH (ref 70–99)
Glucose-Capillary: 209 mg/dL — ABNORMAL HIGH (ref 70–99)

## 2014-05-28 MED ORDER — TORSEMIDE 20 MG PO TABS
80.0000 mg | ORAL_TABLET | Freq: Two times a day (BID) | ORAL | Status: DC
  Administered 2014-05-29: 80 mg via ORAL
  Filled 2014-05-28 (×2): qty 4

## 2014-05-28 MED ORDER — FUROSEMIDE 10 MG/ML IJ SOLN
80.0000 mg | Freq: Two times a day (BID) | INTRAMUSCULAR | Status: AC
  Administered 2014-05-28 (×2): 80 mg via INTRAVENOUS
  Filled 2014-05-28 (×2): qty 8

## 2014-05-28 NOTE — Progress Notes (Addendum)
The elevated troponins are insignificant clinically and cannot/should not be further acted upon. Renal function has improved with diuresis. Needs additional IV diuresis and then switch back to oral demadex 80 mg PO BID tomorrow. Possibly home l;ate today after furosemide or in AM.

## 2014-05-28 NOTE — Progress Notes (Signed)
PATIENT DETAILS Name: [name] Age: 81 y.o. Sex: male Date of Birth: 08-21-1918 Admit Date: 05/27/2014 Admitting Physician Ivor Costa, MD PYK:[id number] S, MD  Subjective: Feels better, some minimal confusion this am.  Assessment/Plan: Principal Problem:   Acute exacerbation of Diastolic CHF (congestive heart failure): Admitted and started on IV furosemide. Cardiology was consulted. Seems to be much more compensated. Will continue diuretics at the direction of cardiology, but suspect close to being compensated. Suspect should be able to discharge on 1/16.  Active Problems:   Acute on chronic stage disease stage IV: Creatinine improved following initiation of IV Lasix. Continue to monitor closely and will recheck electrolytes in a.m.    Minimally elevated troponin: Likely false-positive elevation secondary to chronic kidney disease. No indication for further intervention at this time.    Atrial fibrillation: Chronic issue. Continue amiodarone, and metoprolol. Continue anticoagulation with Eliquis    History of sick sinus syndrome:Has St. Jude's pacemaker, monitor in telemetry    History of BPH with urinary retention: Has chronic Foley catheter in place. Continue Avodart and Flomax   Type 2 diabetes mellitus: CBGs controlled, continue with Lantus and SSI.    Peripheral neuropathy: Secondary to diabetes, continue Cymbalta    Hypertension: Controlled, continue with Imdur, Lasix, hydralazine and metoprolol. Follow BP, titrate medications accordingly  Disposition: Remain inpatient  Antibiotics:  None   Anti-infectives    None      DVT Prophylaxis: On Eliquis  Code Status: Full code   Family Communication None at bedside  Procedures:  None  CONSULTS:  cardiology  Time spent 40 minutes-which includes 50% of the time with face-to-face with patient/ family and coordinating care related to the above assessment and plan.  MEDICATIONS: Scheduled  Meds: . allopurinol  200 mg Oral Daily  . amiodarone  200 mg Oral QHS  . apixaban  2.5 mg Oral BID  . DULoxetine  20 mg Oral Daily  . dutasteride  0.5 mg Oral Daily  . ferrous sulfate  325 mg Oral QODAY  . furosemide  80 mg Intravenous BID  . hydrALAZINE  25 mg Oral Q12H  . hydroxypropyl methylcellulose / hypromellose  1 drop Both Eyes BID  . insulin aspart  0-9 Units Subcutaneous TID WC  . insulin glargine  10 Units Subcutaneous QHS  . isosorbide mononitrate  60 mg Oral Daily  . latanoprost  1 drop Both Eyes QHS  . loratadine  10 mg Oral Daily  . metoprolol tartrate  25 mg Oral Daily  . multivitamin with minerals  1 tablet Oral Daily  . potassium chloride SA  20 mEq Oral Daily  . sodium chloride  3 mL Intravenous Q12H  . tamsulosin  0.4 mg Oral Daily  . [START ON 05/29/2014] torsemide  80 mg Oral BID   Continuous Infusions:  PRN Meds:.sodium chloride, acetaminophen, nitroGLYCERIN, ondansetron (ZOFRAN) IV, polyethylene glycol, sodium chloride    PHYSICAL EXAM: Vital signs in last 24 hours: Filed Vitals:   05/28/14 0516 05/28/14 0719 05/28/14 0941 05/28/14 1406  BP: 137/65  121/54 122/55  Pulse: 64  67 68  Temp: 97.5 F (36.4 C)   97.7 F (36.5 C)  TempSrc: Oral   Oral  Resp: 20   20  Height:      Weight:  90.5 kg (199 lb 8.3 oz)    SpO2: 90%   93%    Weight change:  Filed Weights   05/27/14 0700 05/28/14 0719  Weight: 93.6  kg (206 lb 5.6 oz) 90.5 kg (199 lb 8.3 oz)   Body mass index is 31.24 kg/(m^2).   Gen Exam: Awake,mostly alert-but mildly confused, has clear speech.   Neck: Supple, No JVD.   Chest: B/L Clear.   CVS: S1 S2 irregular  Abdomen: soft, BS +, non tender, non distended.  Extremities: no edema, lower extremities warm to touch. Neurologic: Non Focal.   Skin: No Rash.   Wounds: N/A.   Intake/Output from previous day:  Intake/Output Summary (Last 24 hours) at 05/28/14 1411 Last data filed at 05/28/14 1300  Gross per 24 hour  Intake    900 ml    Output   1800 ml  Net   -900 ml     LAB RESULTS: CBC  Recent Labs Lab 05/27/14 0421  WBC 6.5  HGB 11.9*  HCT 36.8*  PLT 214  MCV 103.4*  MCH 33.4  MCHC 32.3  RDW 15.4    Chemistries   Recent Labs Lab 05/27/14 0421 05/27/14 1444 05/28/14 0537  NA 139  --  138  K 4.3 4.1 3.5  CL 97  --  95*  CO2 31  --  32  GLUCOSE 181*  --  129*  BUN 28*  --  29*  CREATININE 2.74*  --  2.26*  CALCIUM 9.5  --  9.0    CBG:  Recent Labs Lab 05/27/14 1621 05/27/14 2128 05/28/14 0020 05/28/14 0548 05/28/14 1142  GLUCAP 186* 212* 176* 136* 187*    GFR Estimated Creatinine Clearance: 21.9 mL/min (by C-G formula based on Cr of 2.26).  Coagulation profile No results for input(s): INR, PROTIME in the last 168 hours.  Cardiac Enzymes  Recent Labs Lab 05/27/14 0512 05/27/14 1100 05/27/14 1854  TROPONINI 0.12* 0.14* 0.13*    Invalid input(s): POCBNP No results for input(s): DDIMER in the last 72 hours. No results for input(s): HGBA1C in the last 72 hours. No results for input(s): CHOL, HDL, LDLCALC, TRIG, CHOLHDL, LDLDIRECT in the last 72 hours. No results for input(s): TSH, T4TOTAL, T3FREE, THYROIDAB in the last 72 hours.  Invalid input(s): FREET3 No results for input(s): VITAMINB12, FOLATE, FERRITIN, TIBC, IRON, RETICCTPCT in the last 72 hours. No results for input(s): LIPASE, AMYLASE in the last 72 hours.  Urine Studies No results for input(s): UHGB, CRYS in the last 72 hours.  Invalid input(s): UACOL, UAPR, USPG, UPH, UTP, UGL, UKET, UBIL, UNIT, UROB, ULEU, UEPI, UWBC, URBC, UBAC, CAST, UCOM, BILUA  MICROBIOLOGY: No results found for this or any previous visit (from the past 240 hour(s)).  RADIOLOGY STUDIES/RESULTS: Dg Chest Port 1 View  05/27/2014   CLINICAL DATA:  Acute onset of shortness of breath. Initial encounter.  EXAM: PORTABLE CHEST - 1 VIEW  COMPARISON:  Chest radiograph performed 04/04/2014  FINDINGS: The lungs are well-aerated. A small left  pleural effusion is noted, with bibasilar airspace opacification, concerning for pulmonary edema. This is mildly worsened from the prior study. No pneumothorax is seen.  The cardiomediastinal silhouette is borderline normal in size. A pacemaker is noted overlying the left chest wall, with leads ending overlying the right atrium and right ventricle. No acute osseous abnormalities are seen.  IMPRESSION: Small left pleural effusion noted, with bibasilar airspace opacification, concerning for pulmonary edema. This is mildly worsened from the prior study.   Electronically Signed   By: Garald Balding M.D.   On: 05/27/2014 05:00    Oren Binet, MD  [facility] Pager:[phone]  If 7PM-7AM, please contact night-coverage [email or web address]  Password [email or web address] 05/28/2014, 2:11 PM   LOS: 1 day

## 2014-05-29 DIAGNOSIS — N184 Chronic kidney disease, stage 4 (severe): Secondary | ICD-10-CM

## 2014-05-29 LAB — BASIC METABOLIC PANEL
Anion gap: 10 (ref 5–15)
BUN: 31 mg/dL — ABNORMAL HIGH (ref 6–23)
CALCIUM: 9.2 mg/dL (ref 8.4–10.5)
CHLORIDE: 95 meq/L — AB (ref 96–112)
CO2: 34 mmol/L — ABNORMAL HIGH (ref 19–32)
CREATININE: 2.38 mg/dL — AB (ref 0.50–1.35)
GFR calc Af Amer: 25 mL/min — ABNORMAL LOW (ref >90)
GFR calc non Af Amer: 22 mL/min — ABNORMAL LOW (ref >90)
Glucose, Bld: 246 mg/dL — ABNORMAL HIGH (ref 70–99)
Potassium: 3.7 mmol/L (ref 3.5–5.1)
Sodium: 139 mmol/L (ref 135–145)

## 2014-05-29 LAB — GLUCOSE, CAPILLARY: GLUCOSE-CAPILLARY: 195 mg/dL — AB (ref 70–99)

## 2014-05-29 MED ORDER — TORSEMIDE 20 MG PO TABS: ORAL_TABLET | ORAL | Status: DC

## 2014-05-29 NOTE — Discharge Summary (Signed)
PATIENT DETAILS Name: [name] Age: 81 y.o. Sex: male Date of Birth: [birth year] MRN: [id number]. Admitting Physician: Ivor Costa, MD UDJ:[id number],OVZCHYI S, MD  Admit Date: 05/27/2014 Discharge date: 05/29/2014  Recommendations for Outpatient Follow-up:  1. Please check chemistries in 1 week.  PRIMARY DISCHARGE DIAGNOSIS:  Principal Problem:   Acute exacerbation of CHF (congestive heart failure) Active Problems:   Type 2 diabetes mellitus with peripheral neuropathy   Chronic anticoagulation   Pacemaker - St Jude   Urinary retention, now with foley cath and leg bag.   CKD (chronic kidney disease) stage 4, GFR 15-29 ml/min   Essential hypertension   CHF (congestive heart failure)      PAST MEDICAL HISTORY: Past Medical History  Diagnosis Date  . Hyperlipidemia   . Coronary artery disease   . Spinal stenosis   . Pneumonia   . Anemia   . Carotid artery disease   . PVD (peripheral vascular disease)   . Hyponatremia     Chronic  . CVA (cerebral infarction)   . CKD (chronic kidney disease) stage 4, GFR 15-29 ml/min   . Long term (current) use of anticoagulants     No bleeding on Eliquis  . PAF (paroxysmal atrial fibrillation)     Asymptomatic. On Eliquis.  . Sick sinus syndrome     With DDD St. Jude PM, initially placed in 1993 by Dr. Nils Pyle. Device upgrade 10/2002 to DDD, by Dr. Rollene Fare complicated by bleeding.  [name] Hypertensive cardiovascular disease   . CHF (congestive heart failure)   . Chronic diastolic congestive heart failure     ECHO 05/20/12 LVEF estimated by 2D at 55-60%  . Arthritis   . Shortness of breath   . Gait disorder 03/31/2014  . Diabetic peripheral neuropathy associated with type 2 diabetes mellitus 03/31/2014  . Diabetes mellitus     insulin dependent    DISCHARGE MEDICATIONS: Current Discharge Medication List    CONTINUE these medications which have CHANGED   Details  torsemide (DEMADEX) 20 MG tablet Take 80mg  in the AM and 80mg  in the  PM Qty: 200 tablet, Refills: 0      CONTINUE these medications which have NOT CHANGED   Details  allopurinol (ZYLOPRIM) 100 MG tablet Take 200 mg by mouth daily.     amiodarone (PACERONE) 200 MG tablet Take 1 tablet (200 mg total) by mouth at bedtime.    apixaban (ELIQUIS) 2.5 MG TABS tablet Take 1 tablet (2.5 mg total) by mouth 2 (two) times daily. Qty: 60 tablet, Refills: 5    DULoxetine (CYMBALTA) 20 MG capsule Take 1 capsule (20 mg total) by mouth daily. Qty: 30 capsule, Refills: 3    dutasteride (AVODART) 0.5 MG capsule Take 0.5 mg by mouth daily.     ferrous sulfate 325 (65 FE) MG tablet Take 325 mg by mouth every other day.     Flaxseed, Linseed, (BL FLAX SEED OIL PO) Take 1 capsule by mouth 2 (two) times daily.    hydrALAZINE (APRESOLINE) 25 MG tablet Take 25 mg by mouth every 12 (twelve) hours.     insulin aspart (NOVOLOG FLEXPEN) 100 UNIT/ML FlexPen Inject 2-9 Units into the skin 3 (three) times daily with meals. Sliding scale    insulin glargine (LANTUS) 100 UNIT/ML injection Inject 9-10 Units into the skin at bedtime.     isosorbide mononitrate (IMDUR) 60 MG 24 hr tablet Take 60 mg by mouth daily.   Associated Diagnoses: Renal insufficiency    metoprolol tartrate (LOPRESSOR)  25 MG tablet Take 1 tablet (25 mg total) by mouth daily. Qty: 30 tablet, Refills: 6    Multiple Vitamin (MULTIVITAMIN WITH MINERALS) TABS Take 1 tablet by mouth daily. Diabetic pak by Petra Kuba from [facility]    nitroGLYCERIN (NITROSTAT) 0.4 MG SL tablet Place 1 tablet (0.4 mg total) under the tongue every 5 (five) minutes as needed for chest pain. Qty: 25 tablet, Refills: 3   Associated Diagnoses: Angina pectoris    polyethylene glycol (MIRALAX / GLYCOLAX) packet Take 17 g by mouth 2 (two) times daily as needed for mild constipation. For constipation    Polyvinyl Alcohol-Povidone (REFRESH OP) Place 1 drop into both eyes 2 (two) times daily. Refresh Gel Opth    potassium chloride SA  (K-DUR,KLOR-CON) 20 MEQ tablet Take 20 mEq by mouth daily.     silodosin (RAPAFLO) 8 MG CAPS capsule Take 8 mg by mouth daily with breakfast.    Tafluprost (ZIOPTAN) 0.0015 % SOLN Place 1 drop into both eyes at bedtime. Use at night      STOP taking these medications     fexofenadine (ALLEGRA) 180 MG tablet         ALLERGIES:   Allergies  Allergen Reactions  . Aggrenox [Aspirin-Dipyridamole Er] Other (See Comments)    Dizziness  . Carvedilol Other (See Comments)    Dizziness  . Claritin [Loratadine] Other (See Comments)    Dizziness & drowsiness   . Mucinex [Guaifenesin Er] Other (See Comments)    Dizziness, drowsiness  . Plavix [Clopidogrel Bisulfate] Other (See Comments)    Dizziness  . Rapaflo [Silodosin] Other (See Comments)    Dizziness for about 30- 45  minutes  . Statins Other (See Comments)    rhabdomyolisis  . 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 Initial encounter.  EXAM: PORTABLE CHEST - 1 VIEW  COMPARISON:  Chest radiograph performed 04/04/2014  FINDINGS: The lungs are well-aerated. A small left pleural effusion is noted, with bibasilar airspace opacification, concerning for pulmonary edema. This is mildly worsened from the prior study. No pneumothorax is seen.  The cardiomediastinal silhouette is borderline normal in size. A pacemaker is noted overlying the left chest wall, with leads ending overlying  the right atrium and right ventricle. No acute osseous abnormalities are seen.  IMPRESSION: Small left pleural effusion noted, with bibasilar airspace opacification, concerning for pulmonary edema. This is mildly worsened from the prior study.   Electronically Signed   By: Garald Balding M.D.   On: 05/27/2014 05:00     PERTINENT LAB RESULTS: CBC:  Recent Labs  05/27/14 0421  WBC 6.5  HGB 11.9*  HCT 36.8*  PLT 214   CMET CMP     Component Value Date/Time   NA 139 05/29/2014 0450   K 3.7 05/29/2014 0450   CL 95* 05/29/2014 0450   CO2 34* 05/29/2014 0450   GLUCOSE 246* 05/29/2014 0450   BUN 31* 05/29/2014 0450   CREATININE 2.38*  05/29/2014 0450   CALCIUM 9.2 05/29/2014 0450   PROT 7.0 04/02/2014 0502   ALBUMIN 3.3* 04/02/2014 0502   AST 55* 04/02/2014 0502   ALT 54* 04/02/2014 0502   ALKPHOS 60 04/02/2014 0502   BILITOT 0.4 04/02/2014 0502   GFRNONAA 22* 05/29/2014 0450   GFRAA 25* 05/29/2014 0450    GFR Estimated Creatinine Clearance: 20.3 mL/min (by C-G formula based on Cr of 2.38). No results for input(s): LIPASE, AMYLASE in the last 72 hours.  Recent Labs  05/27/14 0512 05/27/14 1100 05/27/14 1854  TROPONINI 0.12* 0.14* 0.13*   Invalid input(s): POCBNP No results for input(s): DDIMER in the last 72 hours. No results for input(s): HGBA1C in the last 72 hours. No results for input(s): CHOL, HDL, LDLCALC, TRIG, CHOLHDL, LDLDIRECT in the last 72 hours. No results for input(s): TSH, T4TOTAL, T3FREE, THYROIDAB in the last 72 hours.  Invalid input(s): FREET3 No results for input(s): VITAMINB12, FOLATE, FERRITIN, TIBC, IRON, RETICCTPCT in the last 72 hours. Coags: No results for input(s): INR in the last 72 hours.  Invalid input(s): PT Microbiology: No results found for this or any previous visit (from the past 240 hour(s)).   BRIEF HOSPITAL COURSE:  Acute exacerbation of Diastolic CHF (congestive heart failure): Admitted and started on IV furosemide.  Cardiology was consulted. Significantly compensated, followed by cardiology on 1/16, current recommendations are to discharge on 8 mg twice daily of Demadex. Have asked patient to follow-up with his cardiologist in the next week or so.  Active Problems:  Acute on chronic stage disease stage IV: Creatinine improved following initiation of IV Lasix.  creatinine closely usual baseline. Please continue to monitor electrolytes closely while on diuretics   Minimally elevated troponin: Likely false-positive elevation secondary to chronic kidney disease. No indication for further intervention at this time.   Atrial fibrillation: Chronic issue. Continue amiodarone, and metoprolol. Continue anticoagulation with Eliquis   History of sick sinus syndrome:Has St. Jude's pacemaker, monitored in telemetry   History of BPH with urinary retention: Has chronic Foley catheter in place. Continue Avodart and Flomax  Type 2 diabetes mellitus: CBGs controlled, continue with Lantus and SSI.   Peripheral neuropathy: Secondary to diabetes, continue Cymbalta   Hypertension: Controlled, continue with Imdur, Lasix, hydralazine and metoprolol.    TODAY-DAY OF DISCHARGE:  Subjective:   [place] Qua today has no headache,no chest abdominal pain,no new weakness tingling or numbness, feels much better wants to go home today.   Objective:   Blood pressure 146/61, pulse 68, temperature 97.9 F (36.6 C), temperature source Oral, resp. rate 22, height 5\' 7"  (1.702 m), weight 85.9 kg (189 lb 6 oz), SpO2 93 %.  Intake/Output Summary (Last 24 hours) at 05/29/14 1149 Last data filed at 05/29/14 0900  Gross per 24 hour  Intake    490 ml  Output   1200 ml  Net   -710 ml   Filed Weights   05/27/14 0700 05/28/14 0719 05/29/14 0427  Weight: 93.6 kg (206 lb 5.6 oz) 90.5 kg (199 lb 8.3 oz) 85.9 kg (189 lb 6 oz)    Exam Awake Alert, Oriented *3, No new F.N deficits, Normal affect [place].AT,PERRAL Supple Neck,No JVD,  No cervical lymphadenopathy appriciated.  Symmetrical Chest wall movement, Good air movement bilaterally, CTAB RRR,No Gallops,Rubs or new Murmurs, No Parasternal Heave +ve B.Sounds, Abd Soft, Non tender, No organomegaly appriciated, No rebound -guarding or rigidity. No Cyanosis, Clubbing or edema, No new Rash or bruise  DISCHARGE CONDITION: Stable  DISPOSITION: Home  DISCHARGE INSTRUCTIONS:    Activity:  As tolerated with Full fall precautions use walker/cane & assistance as needed  Diet recommendation: Diabetic Diet Heart Healthy diet  Discharge Instructions    (HEART FAILURE PATIENTS) Call MD:  Anytime you have any of the following symptoms: 1) 3 pound weight gain in 24 hours or 5 pounds in 1 week 2) shortness of breath, with or without a dry hacking cough 3) swelling in the hands, feet or stomach 4) if you have to sleep on extra pillows at night in order to breathe.    Complete by:  As directed      Diet - low sodium heart healthy    Complete by:  As directed      Increase activity slowly    Complete by:  As directed            Follow-up Information    Follow up with [name], MD. Schedule an appointment as soon as possible for a visit in 1 week.   Specialty:  Internal Medicine   Contact information:   [address] [place] [address] [phone]       Follow up with [name], MD. Schedule an appointment as soon as possible for a visit in 1 week.   Specialty:  Cardiology   Contact information:   [address] [place] [address] [phone]         Total Time spent on discharge equals 45 minutes.  SignedOren Binet 05/29/2014 11:49 AM

## 2014-05-29 NOTE — Progress Notes (Signed)
Pt discharged home with care taker Discharge instructions given & reviewed with pt and caretaker Education discussed  IV dc'd  Tele dc'd  Pt discharged via wheelchair with  All pt belongs at side.  [name] 2:19 PM

## 2014-05-29 NOTE — Progress Notes (Signed)
Subjective:  Says he feels back to baseline today, not short of breath.  Objective:  Vital Signs in the last 24 hours: BP 146/61 mmHg  Pulse 68  Temp(Src) 97.9 F (36.6 C) (Oral)  Resp 22  Ht 5\' 7"  (1.702 m)  Wt 85.9 kg (189 lb 6 oz)  BMI 29.65 kg/m2  SpO2 93%  Physical Exam: Pleasant elderly black male in no acute distress Lungs:  Clear Cardiac:  Regular rhythm, normal S1 and S2, no S3, 1 to 2/6 murmur Abdomen:  Foley catheter in place Extremities:  1+ edema present  Intake/Output from previous day: 01/15 0701 - 01/16 0700 In: 490 [P.O.:490] Out: 1200 [Urine:1200]  Weight Filed Weights   05/27/14 0700 05/28/14 0719 05/29/14 0427  Weight: 93.6 kg (206 lb 5.6 oz) 90.5 kg (199 lb 8.3 oz) 85.9 kg (189 lb 6 oz)    Lab Results: Basic Metabolic Panel:  Recent Labs  05/28/14 0537 05/29/14 0450  NA 138 139  K 3.5 3.7  CL 95* 95*  CO2 32 34*  GLUCOSE 129* 246*  BUN 29* 31*  CREATININE 2.26* 2.38*   CBC:  Recent Labs  05/27/14 0421  WBC 6.5  HGB 11.9*  HCT 36.8*  MCV 103.4*  PLT 214   Cardiac Enzymes:  Recent Labs  05/27/14 0512 05/27/14 1100 05/27/14 1854  TROPONINI 0.12* 0.14* 0.13*    Telemetry: Atrial fibrillation with controlled response  Assessment/Plan:  1.  Acute on chronic diastolic heart failure, suspect now compensated 2.  Chronic kidney disease stage IV. 3.  Mild elevation of troponin, no revision for further evaluation   recommendations:  I suspect he is back to baseline and compensated.  May go home on torsemide 80 mg twice a day in addition to his usual medicine.  Follow-up with cardiology in one to 2 weeks.   Kerry Hough  MD [facility]  05/29/2014, 10:16 AM

## 2014-05-29 NOTE — Progress Notes (Signed)
PT Cancellation Note  Patient Details Name: [name] MRN: [id number] DOB: 09/14/1918   Cancelled Treatment:    Reason Eval/Treat Not Completed: PT screened, no needs identified, will sign off (pt being discharged, has 24 hr caregivers and states no significant need or change from baseline)   Melford Aase 05/29/2014, 12:54 PM Elwyn Reach, [place]

## 2014-05-31 LAB — GLUCOSE, CAPILLARY: GLUCOSE-CAPILLARY: 268 mg/dL — AB (ref 70–99)

## 2014-06-01 ENCOUNTER — Telehealth: Payer: Self-pay

## 2014-06-01 NOTE — Telephone Encounter (Signed)
Pt caretaker aware. Post hospital f/u schedule on 1/25 @ 8am

## 2014-06-02 ENCOUNTER — Ambulatory Visit (INDEPENDENT_AMBULATORY_CARE_PROVIDER_SITE_OTHER): Payer: Medicare Other | Admitting: *Deleted

## 2014-06-02 ENCOUNTER — Encounter: Payer: Self-pay | Admitting: Internal Medicine

## 2014-06-02 DIAGNOSIS — I495 Sick sinus syndrome: Secondary | ICD-10-CM

## 2014-06-02 LAB — MDC_IDC_ENUM_SESS_TYPE_REMOTE
Brady Statistic AP VP Percent: 3 %
Brady Statistic AP VS Percent: 97 %
Brady Statistic AS VP Percent: 1 %
Brady Statistic AS VS Percent: 1 %
Implantable Pulse Generator Model: 2210
Implantable Pulse Generator Serial Number: 7151645
Lead Channel Impedance Value: 490 Ohm
Lead Channel Pacing Threshold Amplitude: 1.25 V
Lead Channel Pacing Threshold Pulse Width: 0.6 ms
Lead Channel Pacing Threshold Pulse Width: 0.8 ms
Lead Channel Sensing Intrinsic Amplitude: 10.9 mV
Lead Channel Setting Pacing Amplitude: 2.5 V
Lead Channel Setting Pacing Amplitude: 2.5 V
Lead Channel Setting Pacing Pulse Width: 0.6 ms
Lead Channel Setting Sensing Sensitivity: 2 mV
MDC IDC MSMT BATTERY REMAINING LONGEVITY: 46 mo
MDC IDC MSMT BATTERY REMAINING PERCENTAGE: 59 %
MDC IDC MSMT BATTERY VOLTAGE: 2.92 V
MDC IDC MSMT LEADCHNL RA IMPEDANCE VALUE: 200 Ohm
MDC IDC MSMT LEADCHNL RA SENSING INTR AMPL: 1.4 mV
MDC IDC MSMT LEADCHNL RV PACING THRESHOLD AMPLITUDE: 1 V
MDC IDC SESS DTM: 20160120083639
MDC IDC STAT BRADY RA PERCENT PACED: 99 %
MDC IDC STAT BRADY RV PERCENT PACED: 3 %

## 2014-06-02 NOTE — Progress Notes (Signed)
Remote pacemaker transmission.   

## 2014-06-07 ENCOUNTER — Encounter: Payer: Medicare Other | Admitting: Interventional Cardiology

## 2014-06-08 ENCOUNTER — Ambulatory Visit (INDEPENDENT_AMBULATORY_CARE_PROVIDER_SITE_OTHER): Payer: Medicare Other | Admitting: Interventional Cardiology

## 2014-06-08 ENCOUNTER — Encounter: Payer: Self-pay | Admitting: Interventional Cardiology

## 2014-06-08 ENCOUNTER — Encounter: Payer: Self-pay | Admitting: Cardiology

## 2014-06-08 VITALS — BP 105/62 | HR 67 | Ht 67.0 in | Wt 186.1 lb

## 2014-06-08 DIAGNOSIS — I5032 Chronic diastolic (congestive) heart failure: Secondary | ICD-10-CM

## 2014-06-08 DIAGNOSIS — I10 Essential (primary) hypertension: Secondary | ICD-10-CM

## 2014-06-08 DIAGNOSIS — Z79899 Other long term (current) drug therapy: Secondary | ICD-10-CM

## 2014-06-08 DIAGNOSIS — Z950 Presence of cardiac pacemaker: Secondary | ICD-10-CM

## 2014-06-08 DIAGNOSIS — E1121 Type 2 diabetes mellitus with diabetic nephropathy: Secondary | ICD-10-CM

## 2014-06-08 DIAGNOSIS — N184 Chronic kidney disease, stage 4 (severe): Secondary | ICD-10-CM

## 2014-06-08 DIAGNOSIS — Z7901 Long term (current) use of anticoagulants: Secondary | ICD-10-CM

## 2014-06-08 DIAGNOSIS — I480 Paroxysmal atrial fibrillation: Secondary | ICD-10-CM

## 2014-06-08 LAB — BASIC METABOLIC PANEL
BUN: 32 mg/dL — ABNORMAL HIGH (ref 6–23)
CO2: 31 meq/L (ref 19–32)
CREATININE: 2.27 mg/dL — AB (ref 0.40–1.50)
Calcium: 9.2 mg/dL (ref 8.4–10.5)
Chloride: 98 mEq/L (ref 96–112)
GFR: 34.75 mL/min — AB (ref >60.00)
GLUCOSE: 260 mg/dL — AB (ref 70–99)
POTASSIUM: 4.4 meq/L (ref 3.5–5.1)
SODIUM: 137 meq/L (ref 135–145)

## 2014-06-08 LAB — HEMOGLOBIN A1C: Hgb A1c MFr Bld: 8.2 % — ABNORMAL HIGH (ref 4.6–6.5)

## 2014-06-08 NOTE — Progress Notes (Signed)
Patient ID: [name], male   DOB: January 05, 1919, 81 y.o.   MRN: [id number]    [address], [address], [place]  [address] Phone: [phone] Fax:  [phone]  Date:  06/08/2014   ID:  [name], DOB June 13, 1918, MRN [id number]  PCP:  [name], MD   ASSESSMENT:  1. Diastolic heart failure, stable 2. Low blood pressure 3. PAF, stable on amiodarone 4. CKD stage 4 5. DM type 2  PLAN:  1. Basic metabolic panel today and on return in 2 weeks 2. Hemoglobin A1c today 3. Decrease hydralazine to 25 mg p.m. 4. Discontinue isosorbide 5. Weigh daily 6. Call of dyspnea or for home weights greater than 188.5 pounds   SUBJECTIVE: [name] is a 81 y.o. male who is doing relatively well. He feels thirsty. He is dizzy when he stands up. He also feels dizzy while sitting. He feels he is dehydrated. He denies chest pain. Appetite remains stable. He is auto adjusted his diuretics from 80 mg twice a day down to 80 mg and 70 mg daily of torsemide   Wt Readings from Last 3 Encounters:  06/08/14 186 lb 1.6 oz (84.414 kg)  05/29/14 189 lb 6 oz (85.9 kg)  05/18/14 188 lb 12.8 oz (85.639 kg)     Past Medical History  Diagnosis Date  . Hyperlipidemia   . Coronary artery disease   . Spinal stenosis   . Pneumonia   . Anemia   . Carotid artery disease   . PVD (peripheral vascular disease)   . Hyponatremia     Chronic  . CVA (cerebral infarction)   . CKD (chronic kidney disease) stage 4, GFR 15-29 ml/min   . Long term (current) use of anticoagulants     No bleeding on Eliquis  . PAF (paroxysmal atrial fibrillation)     Asymptomatic. On Eliquis.  . Sick sinus syndrome     With DDD St. Jude PM, initially placed in 1993 by Dr. Nils Pyle. Device upgrade 10/2002 to DDD, by Dr. Rollene Fare complicated by bleeding.  [name] Hypertensive cardiovascular disease   . CHF (congestive heart failure)   . Chronic diastolic congestive heart failure     ECHO 05/20/12 LVEF estimated by 2D  at 55-60%  . Arthritis   . Shortness of breath   . Gait disorder 03/31/2014  . Diabetic peripheral neuropathy associated with type 2 diabetes mellitus 03/31/2014  . Diabetes mellitus     insulin dependent    Current Outpatient Prescriptions  Medication Sig Dispense Refill  . allopurinol (ZYLOPRIM) 100 MG tablet Take 200 mg by mouth daily.     [name] amiodarone (PACERONE) 200 MG tablet Take 1 tablet (200 mg total) by mouth at bedtime.    [name] apixaban (ELIQUIS) 2.5 MG TABS tablet Take 1 tablet (2.5 mg total) by mouth 2 (two) times daily. 60 tablet 5  . dutasteride (AVODART) 0.5 MG capsule Take 0.5 mg by mouth daily.     . ferrous sulfate 325 (65 FE) MG tablet Take 325 mg by mouth every other day.     . Flaxseed, Linseed, (BL FLAX SEED OIL PO) Take 1 capsule by mouth 2 (two) times daily.    . hydrALAZINE (APRESOLINE) 25 MG tablet Take 25 mg by mouth every 12 (twelve) hours.     . insulin aspart (NOVOLOG FLEXPEN) 100 UNIT/ML FlexPen Inject 2-9 Units into the skin 3 (three) times daily with meals. Sliding scale    . insulin glargine (  LANTUS) 100 UNIT/ML injection Inject 9-10 Units into the skin at bedtime.     . isosorbide mononitrate (IMDUR) 60 MG 24 hr tablet Take 60 mg by mouth daily.    . metoprolol tartrate (LOPRESSOR) 25 MG tablet Take 1 tablet (25 mg total) by mouth daily. 30 tablet 6  . Multiple Vitamin (MULTIVITAMIN WITH MINERALS) TABS Take 1 tablet by mouth daily. Diabetic pak by Petra Kuba from [employer]    . nitroGLYCERIN (NITROSTAT) 0.4 MG SL tablet Place 1 tablet (0.4 mg total) under the tongue every 5 (five) minutes as needed for chest pain. 25 tablet 3  . polyethylene glycol (MIRALAX / GLYCOLAX) packet Take 17 g by mouth 2 (two) times daily as needed for mild constipation. For constipation    . Polyvinyl Alcohol-Povidone (REFRESH OP) Place 1 drop into both eyes 2 (two) times daily. Refresh Gel Opth    . potassium chloride SA (K-DUR,KLOR-CON) 20 MEQ tablet Take 20 mEq by mouth daily.     .  silodosin (RAPAFLO) 8 MG CAPS capsule Take 8 mg by mouth daily with breakfast.    . Tafluprost (ZIOPTAN) 0.0015 % SOLN Place 1 drop into both eyes at bedtime. Use at night    . torsemide (DEMADEX) 20 MG tablet Take 80mg  in the AM and 80mg  in the PM 200 tablet 0   No current facility-administered medications for this visit.    Allergies:    Allergies  Allergen Reactions  . Aggrenox [Aspirin-Dipyridamole Er] Other (See Comments)    Dizziness  . Carvedilol Other (See Comments)    Dizziness  . Claritin [Loratadine] Other (See Comments)    Dizziness & drowsiness   . Mucinex [Guaifenesin Er] Other (See Comments)    Dizziness, drowsiness  . Plavix [Clopidogrel Bisulfate] Other (See Comments)    Dizziness  . Rapaflo [Silodosin] Other (See Comments)    Dizziness for about 30- 45  minutes  . Statins Other (See Comments)    rhabdomyolisis  . Travatan Z [Travoprost] Other (See Comments)    Eye irritation    Social History:  The patient  reports that he quit smoking about 56 years ago. His smoking use included Pipe. He has never used smokeless tobacco. He reports that he does not drink alcohol or use illicit drugs.   ROS:  Please see the history of present illness.   Appetite is stable no orthopnea. No edema.   All other systems reviewed and negative.   OBJECTIVE: VS:  BP 105/62 mmHg  Pulse 67  Ht 5\' 7"  (1.702 m)  Wt 186 lb 1.6 oz (84.414 kg)  BMI 29.14 kg/m2 Well nourished, well developed, in no acute distress, elderly HEENT: normal Neck: JVD flat. Carotid bruit absent  Cardiac:  normal S1, S2; RRR; no murmur Lungs:  clear to auscultation bilaterally, no wheezing, rhonchi or rales Abd: soft, nontender, no hepatomegaly Ext: Edema absent. Pulses 1+ Skin: warm and dry. Decreased skin turgor. Neuro:  CNs 2-12 intact, no focal abnormalities noted  EKG:  Not repeated       Signed, Illene Labrador III, MD 06/08/2014 11:43 AM

## 2014-06-08 NOTE — Patient Instructions (Signed)
Your physician has recommended you make the following change in your medication:  1) STOP Isosorbide 2) DECREASE Hydralazine to 25mg  each evening  Lab Today: Bmet, HgA1c  Your physician recommends that you return for lab work on 06/22/14 (Bmet)   Your physician recommends that you schedule a follow-up appointment on 06/22/14 @ 9am

## 2014-06-09 ENCOUNTER — Telehealth: Payer: Self-pay

## 2014-06-09 NOTE — Telephone Encounter (Signed)
Pt aware of lab results.Kidney function and potassium are stable. No evidence of dehydration. A1c is high at 8.2. Will send a copy of A1c to Dr. Carlis Abbott. Pt verbalized understanding.

## 2014-06-09 NOTE — Telephone Encounter (Signed)
-----   Message from [name], MD sent at 06/08/2014  5:27 PM EST ----- Kidney function and potassium are stable. No evidence of dehydration. A1c is high at 8.2. See and a copy of A1c to Dr. Carlis Abbott

## 2014-06-16 ENCOUNTER — Encounter: Payer: Self-pay | Admitting: Interventional Cardiology

## 2014-06-16 ENCOUNTER — Ambulatory Visit (INDEPENDENT_AMBULATORY_CARE_PROVIDER_SITE_OTHER): Payer: Medicare Other | Admitting: Interventional Cardiology

## 2014-06-16 VITALS — BP 134/72 | HR 66 | Ht 67.0 in | Wt 188.1 lb

## 2014-06-16 DIAGNOSIS — N184 Chronic kidney disease, stage 4 (severe): Secondary | ICD-10-CM

## 2014-06-16 DIAGNOSIS — I480 Paroxysmal atrial fibrillation: Secondary | ICD-10-CM

## 2014-06-16 DIAGNOSIS — I5032 Chronic diastolic (congestive) heart failure: Secondary | ICD-10-CM

## 2014-06-16 DIAGNOSIS — I10 Essential (primary) hypertension: Secondary | ICD-10-CM

## 2014-06-16 DIAGNOSIS — I208 Other forms of angina pectoris: Secondary | ICD-10-CM

## 2014-06-16 DIAGNOSIS — Z7901 Long term (current) use of anticoagulants: Secondary | ICD-10-CM

## 2014-06-16 NOTE — Progress Notes (Signed)
Patient ID: [name], male   DOB: 15-Sep-1918, 81 y.o.   MRN: [id number]    Cardiology Office Note   Date:  06/16/2014   ID:  [name], DOB Aug 12, 1918, MRN [id number]  PCP:  [name], MD  Cardiologist:   [name], MD   No chief complaint on file.     History of Present Illness: [name] is a 81 y.o. male who presents for chronic diastolic heart failure. We tried the patient off Imdur and he began having chest tightness. That is resolved after resuming Imdur. His weights have fluctuated between 186-189 at home. He has a cough today. His weight this morning is 188.5 pounds. He denies chest discomfort. He denies dyspnea while sitting here in office.    Past Medical History  Diagnosis Date  . Hyperlipidemia   . Coronary artery disease   . Spinal stenosis   . Pneumonia   . Anemia   . Carotid artery disease   . PVD (peripheral vascular disease)   . Hyponatremia     Chronic  . CVA (cerebral infarction)   . CKD (chronic kidney disease) stage 4, GFR 15-29 ml/min   . Long term (current) use of anticoagulants     No bleeding on Eliquis  . PAF (paroxysmal atrial fibrillation)     Asymptomatic. On Eliquis.  . Sick sinus syndrome     With DDD St. Jude PM, initially placed in 1993 by Dr. Nils Pyle. Device upgrade 10/2002 to DDD, by Dr. Rollene Fare complicated by bleeding.  [name] Hypertensive cardiovascular disease   . CHF (congestive heart failure)   . Chronic diastolic congestive heart failure     ECHO 05/20/12 LVEF estimated by 2D at 55-60%  . Arthritis   . Shortness of breath   . Gait disorder 03/31/2014  . Diabetic peripheral neuropathy associated with type 2 diabetes mellitus 03/31/2014  . Diabetes mellitus     insulin dependent    Past Surgical History  Procedure Laterality Date  . Lumbar laminectomy  1995  . Back surgery    . Total knee arthroplasty Left   . Quadriceps tendon repair    . Cataract extraction    . Carotid endarterectomy    . Yag  laser application Right 8/78/6767    Procedure: YAG LASER CAPSULOTOMY OF RIGHT EYE;  Surgeon: [name], MD;  Location: [facility];  Service: Ophthalmology;  Laterality: Right;  . Tonsillectomy    . Pacemaker insertion      DDD St. Jude PM, initially placed in 1993 by Dr. Nils Pyle. Device upgrade 10/2002 to DDD, by Dr. Rollene Fare complicated by bleeding.  . Carotid endarterectomy Right 2006  . Transurethral resection of prostate    . Cardioversion N/A 06/16/2013    Procedure: CARDIOVERSION BEDSIDE;  Surgeon: [name], MD;  Location: [facility];  Service: Cardiovascular;  Laterality: N/A;  . Pacemaker generator change  12/09/2009    SJM [employer] DR RF gen change by Dr [name]     Current Outpatient Prescriptions  Medication Sig Dispense Refill  . allopurinol (ZYLOPRIM) 100 MG tablet Take 200 mg by mouth daily.     [name] amiodarone (PACERONE) 200 MG tablet Take 1 tablet (200 mg total) by mouth at bedtime.    [name] apixaban (ELIQUIS) 2.5 MG TABS tablet Take 1 tablet (2.5 mg total) by mouth 2 (two) times daily. 60 tablet 5  . dutasteride (AVODART) 0.5 MG capsule Take 0.5 mg by mouth daily.     [name]  ferrous sulfate 325 (65 FE) MG tablet Take 325 mg by mouth every other day.     . Flaxseed, Linseed, (BL FLAX SEED OIL PO) Take 1 capsule by mouth 2 (two) times daily.    . hydrALAZINE (APRESOLINE) 25 MG tablet Take 25 mg by mouth every evening.    . insulin aspart (NOVOLOG FLEXPEN) 100 UNIT/ML FlexPen Inject 2-9 Units into the skin 3 (three) times daily with meals. Sliding scale    . insulin glargine (LANTUS) 100 UNIT/ML injection Inject 9-10 Units into the skin at bedtime.     . isosorbide mononitrate (IMDUR) 60 MG 24 hr tablet Take 60 mg by mouth daily.     . metoprolol tartrate (LOPRESSOR) 25 MG tablet Take 1 tablet (25 mg total) by mouth daily. 30 tablet 6  . Multiple Vitamin (MULTIVITAMIN WITH MINERALS) TABS Take 1 tablet by mouth daily. Diabetic pak by Petra Kuba from [employer]    . nitroGLYCERIN  (NITROSTAT) 0.4 MG SL tablet Place 1 tablet (0.4 mg total) under the tongue every 5 (five) minutes as needed for chest pain. 25 tablet 3  . polyethylene glycol (MIRALAX / GLYCOLAX) packet Take 17 g by mouth 2 (two) times daily as needed for mild constipation. For constipation    . Polyvinyl Alcohol-Povidone (REFRESH OP) Place 1 drop into both eyes 2 (two) times daily. Refresh Gel Opth    . potassium chloride SA (K-DUR,KLOR-CON) 20 MEQ tablet Take 20 mEq by mouth daily.     . silodosin (RAPAFLO) 8 MG CAPS capsule Take 8 mg by mouth daily with breakfast.    . Tafluprost (ZIOPTAN) 0.0015 % SOLN Place 1 drop into both eyes at bedtime. Use at night    . torsemide (DEMADEX) 20 MG tablet Take 63m in the AM and 845min the PM 200 tablet 0   No current facility-administered medications for this visit.    Allergies:   Aggrenox; Carvedilol; Claritin; Mucinex; Plavix; Rapaflo; Statins; and Travatan z    Social History:  The patient  reports that he quit smoking about 56 years ago. His smoking use included Pipe. He has never used smokeless tobacco. He reports that he does not drink alcohol or use illicit drugs.   Family History:  The patient's family history includes Hypertension in his mother; Multiple myeloma in his father.    ROS:  Please see the history of present illness.   Otherwise, review of systems are positive for leg weakness and discomfort if we use 80 twice a day of edema do next. He feels better on 80/70 mg daily. That dose allows fluid retention and dyspnea. We are trying to avoid recurrent hospitalizations with heart failure..   All other systems are reviewed and negative.    PHYSICAL EXAM: VS:  BP 134/72 mmHg  Pulse 66  Ht '5\' 7"'  (1.702 m)  Wt 188 lb 1.6 oz (85.322 kg)  BMI 29.45 kg/m2  SpO2 97% , BMI Body mass index is 29.45 kg/(m^2). GEN: Well nourished, well developed, in no acute distress HEENT: normal Neck: no JVD, carotid bruits, or masses Cardiac: RRR; no murmurs, rubs, or  gallops,no edema  Respiratory:  clear to auscultation bilaterally, normal work of breathing GI: soft, nontender, nondistended, + BS MS: no deformity or atrophy Skin: warm and dry, no rash Neuro:  Strength and sensation are intact Psych: euthymic mood, full affect   EKG:  EKG is not ordered today. The ekg ordered today demonstrates    Recent Labs: 08/07/2013: Magnesium 2.4 04/02/2014: ALT 54*  04/04/2014: Pro B Natriuretic peptide (BNP) 4874.0* 05/27/2014: B Natriuretic Peptide 465.0*; Hemoglobin 11.9*; Platelets 214 06/08/2014: BUN 32*; Creatinine 2.27*; Potassium 4.4; Sodium 137    Lipid Panel No results found for: CHOL, TRIG, HDL, CHOLHDL, VLDL, LDLCALC, LDLDIRECT    Wt Readings from Last 3 Encounters:  06/16/14 188 lb 1.6 oz (85.322 kg)  06/08/14 186 lb 1.6 oz (84.414 kg)  05/29/14 189 lb 6 oz (85.9 kg)      Other studies Reviewed: Additional studies/ records that were reviewed today include: We reviewed the most recent basic metabolic panel with a creatinine of 2.3 and potassium of 4.4. His hemoglobin A1c was elevated at 8.2.. Review of the above records demonstrates: See above.   ASSESSMENT AND PLAN:  1.  Chronic diastolic heart failure, mildly decompensated today. He will need to take 80 mg of the mid X twice a day until his weight is 186 pounds on his home scales. He can then drop the evening dose back to 70 mg. 2. Coronary artery disease with angina developing after we discontinued isosorbide. He is now back on that therapy. 3. Chronic kidney disease, stage III-IV, stable 4. History of atrial fibrillation, currently believed to be in normal sinus rhythm 5. Chronic anticoagulation therapy without complications   Current medicines are reviewed at length with the patient today.  The patient Has concerns regarding medicines.  The following changes have been made:  He feels washed out when we aggressively diurese. We have established a weight limit of 188 pounds. At  this weight or above he needs a more aggressive diuretic regimen until his weight is back down to 186 pounds.  Labs/ tests ordered today include: Will have a basic metabolic panel on return visit  No orders of the defined types were placed in this encounter.     Disposition:   FU with Linard Millers in 2 weeks   Signed, [name], MD  06/16/2014 9:56 AM    [facility] [place], [place], [place]  [address] Phone: [phone]; Fax: [phone]

## 2014-06-16 NOTE — Patient Instructions (Signed)
Your physician has recommended you make the following change in your medication:  1) Take Demadex 80mg  twice daily for weights 188lb an greater. Take 80mg  twice daily until your weight is 186lb  Your physician recommends that you return for lab work on 06/28/14 ( Bmet)  You have a follow up appointment scheduled on 06/28/14 @ 3:45pm

## 2014-06-18 ENCOUNTER — Other Ambulatory Visit (INDEPENDENT_AMBULATORY_CARE_PROVIDER_SITE_OTHER): Payer: Medicare Other | Admitting: *Deleted

## 2014-06-18 ENCOUNTER — Telehealth: Payer: Self-pay

## 2014-06-18 DIAGNOSIS — I5032 Chronic diastolic (congestive) heart failure: Secondary | ICD-10-CM

## 2014-06-18 LAB — BASIC METABOLIC PANEL
BUN: 31 mg/dL — AB (ref 6–23)
CHLORIDE: 99 meq/L (ref 96–112)
CO2: 36 meq/L — AB (ref 19–32)
CREATININE: 2.44 mg/dL — AB (ref 0.40–1.50)
Calcium: 9.4 mg/dL (ref 8.4–10.5)
GFR: 31.97 mL/min — ABNORMAL LOW (ref >60.00)
GLUCOSE: 191 mg/dL — AB (ref 70–99)
POTASSIUM: 4.3 meq/L (ref 3.5–5.1)
Sodium: 139 mEq/L (ref 135–145)

## 2014-06-18 NOTE — Telephone Encounter (Signed)
Pt sts that he weight has been consistent at 188lb yesterday and today.his bp today was 155/70 68bpm.he is currently taking Demadex 80mg  bid for weights 188lb and above. He had a urine output of 100cc yesterday and 100cc today. He placed his foley catheter himself on 1/31, he removed it yesterday 06/17/14. He has called his urologist and is waiting a call back. Dr.Smith's recommendations are for pt to come in this afternoon to have a lab bmet drawn just to make sure he is not dehydrated. It may be a mechanical issue that the pt had not placed the catheter far up enough. Lab appt made for this afternoon.pt and pt caretaker Pharmacist, hospital of plan and agreeable.

## 2014-06-18 NOTE — Telephone Encounter (Signed)
Pt caretaker of lab results and Dr.Smith recommendations.Labs are stable. Not sure why lethargic. Measure temperature. May need to get catheter reinserted.  Pt temp is 97.1 F He his urine output @ 4:45pm today was 100cc ( without catheter) They have spoken with Dr.Nelson( pt urologist) to give pt his flomax at bedtime And to continue to monitor his urine out put.  Pt denies sob, caretaker was concerned because all he wanted to do was sleep all day and that id unusual for him. No changes needed at this time

## 2014-06-18 NOTE — Telephone Encounter (Signed)
-----   Message from [name], MD sent at 06/18/2014  5:13 PM EST ----- Labs are stable. Not sure why lethargic. Measure temperature. May need to get catheter reinserted.

## 2014-06-21 ENCOUNTER — Other Ambulatory Visit: Payer: Self-pay

## 2014-06-21 MED ORDER — METOPROLOL TARTRATE 25 MG PO TABS: 25.0000 mg | ORAL_TABLET | Freq: Every day | ORAL | Status: DC

## 2014-06-22 ENCOUNTER — Encounter (HOSPITAL_COMMUNITY): Payer: Self-pay | Admitting: Emergency Medicine

## 2014-06-22 ENCOUNTER — Ambulatory Visit: Payer: Medicare Other | Admitting: Interventional Cardiology

## 2014-06-22 ENCOUNTER — Emergency Department (HOSPITAL_COMMUNITY): Payer: Medicare Other

## 2014-06-22 ENCOUNTER — Inpatient Hospital Stay (HOSPITAL_COMMUNITY)
Admission: EM | Admit: 2014-06-22 | Discharge: 2014-07-03 | DRG: 292 | Disposition: A | Discharge status: Home / Self Care | Payer: Medicare Other | Attending: Cardiology | Admitting: Cardiology

## 2014-06-22 ENCOUNTER — Encounter: Payer: Self-pay | Admitting: Cardiology

## 2014-06-22 DIAGNOSIS — Z888 Allergy status to other drugs, medicaments and biological substances status: Secondary | ICD-10-CM | POA: Diagnosis not present

## 2014-06-22 DIAGNOSIS — I440 Atrioventricular block, first degree: Secondary | ICD-10-CM | POA: Diagnosis present

## 2014-06-22 DIAGNOSIS — I2510 Atherosclerotic heart disease of native coronary artery without angina pectoris: Secondary | ICD-10-CM | POA: Diagnosis present

## 2014-06-22 DIAGNOSIS — E1142 Type 2 diabetes mellitus with diabetic polyneuropathy: Secondary | ICD-10-CM | POA: Diagnosis present

## 2014-06-22 DIAGNOSIS — E785 Hyperlipidemia, unspecified: Secondary | ICD-10-CM | POA: Diagnosis present

## 2014-06-22 DIAGNOSIS — I110 Hypertensive heart disease with heart failure: Principal | ICD-10-CM | POA: Diagnosis present

## 2014-06-22 DIAGNOSIS — R0602 Shortness of breath: Secondary | ICD-10-CM | POA: Diagnosis present

## 2014-06-22 DIAGNOSIS — I248 Other forms of acute ischemic heart disease: Secondary | ICD-10-CM | POA: Diagnosis present

## 2014-06-22 DIAGNOSIS — N179 Acute kidney failure, unspecified: Secondary | ICD-10-CM | POA: Diagnosis not present

## 2014-06-22 DIAGNOSIS — N184 Chronic kidney disease, stage 4 (severe): Secondary | ICD-10-CM | POA: Diagnosis present

## 2014-06-22 DIAGNOSIS — I739 Peripheral vascular disease, unspecified: Secondary | ICD-10-CM | POA: Diagnosis present

## 2014-06-22 DIAGNOSIS — Z950 Presence of cardiac pacemaker: Secondary | ICD-10-CM | POA: Diagnosis present

## 2014-06-22 DIAGNOSIS — I480 Paroxysmal atrial fibrillation: Secondary | ICD-10-CM | POA: Diagnosis present

## 2014-06-22 DIAGNOSIS — E876 Hypokalemia: Secondary | ICD-10-CM | POA: Diagnosis present

## 2014-06-22 DIAGNOSIS — Z87891 Personal history of nicotine dependence: Secondary | ICD-10-CM | POA: Diagnosis not present

## 2014-06-22 DIAGNOSIS — Z8249 Family history of ischemic heart disease and other diseases of the circulatory system: Secondary | ICD-10-CM

## 2014-06-22 DIAGNOSIS — Z7901 Long term (current) use of anticoagulants: Secondary | ICD-10-CM

## 2014-06-22 DIAGNOSIS — Z96652 Presence of left artificial knee joint: Secondary | ICD-10-CM | POA: Diagnosis present

## 2014-06-22 DIAGNOSIS — R7989 Other specified abnormal findings of blood chemistry: Secondary | ICD-10-CM | POA: Diagnosis present

## 2014-06-22 DIAGNOSIS — R339 Retention of urine, unspecified: Secondary | ICD-10-CM | POA: Diagnosis present

## 2014-06-22 DIAGNOSIS — Z794 Long term (current) use of insulin: Secondary | ICD-10-CM

## 2014-06-22 DIAGNOSIS — I5033 Acute on chronic diastolic (congestive) heart failure: Secondary | ICD-10-CM | POA: Diagnosis present

## 2014-06-22 DIAGNOSIS — I5032 Chronic diastolic (congestive) heart failure: Secondary | ICD-10-CM | POA: Diagnosis present

## 2014-06-22 DIAGNOSIS — Z8673 Personal history of transient ischemic attack (TIA), and cerebral infarction without residual deficits: Secondary | ICD-10-CM

## 2014-06-22 DIAGNOSIS — R778 Other specified abnormalities of plasma proteins: Secondary | ICD-10-CM

## 2014-06-22 DIAGNOSIS — Z79899 Other long term (current) drug therapy: Secondary | ICD-10-CM | POA: Diagnosis not present

## 2014-06-22 DIAGNOSIS — I509 Heart failure, unspecified: Secondary | ICD-10-CM

## 2014-06-22 HISTORY — DX: Other specified abnormal findings of blood chemistry: R79.89

## 2014-06-22 HISTORY — DX: Other specified abnormalities of plasma proteins: R77.8

## 2014-06-22 LAB — URINE MICROSCOPIC-ADD ON

## 2014-06-22 LAB — CBC
HCT: 31.7 % — ABNORMAL LOW (ref 39.0–52.0)
Hemoglobin: 10.2 g/dL — ABNORMAL LOW (ref 13.0–17.0)
MCH: 32.4 pg (ref 26.0–34.0)
MCHC: 32.2 g/dL (ref 30.0–36.0)
MCV: 100.6 fL — ABNORMAL HIGH (ref 78.0–100.0)
PLATELETS: 180 10*3/uL (ref 150–400)
RBC: 3.15 MIL/uL — AB (ref 4.22–5.81)
RDW: 15.9 % — ABNORMAL HIGH (ref 11.5–15.5)
WBC: 5.2 10*3/uL (ref 4.0–10.5)

## 2014-06-22 LAB — COMPREHENSIVE METABOLIC PANEL
ALBUMIN: 3.1 g/dL — AB (ref 3.5–5.2)
ALT: 38 U/L (ref 0–53)
ANION GAP: 10 (ref 5–15)
AST: 49 U/L — ABNORMAL HIGH (ref 0–37)
Alkaline Phosphatase: 56 U/L (ref 39–117)
BUN: 34 mg/dL — AB (ref 6–23)
CALCIUM: 9 mg/dL (ref 8.4–10.5)
CHLORIDE: 100 mmol/L (ref 96–112)
CO2: 28 mmol/L (ref 19–32)
Creatinine, Ser: 2.53 mg/dL — ABNORMAL HIGH (ref 0.50–1.35)
GFR calc non Af Amer: 20 mL/min — ABNORMAL LOW (ref >90)
GFR, EST AFRICAN AMERICAN: 24 mL/min — AB (ref >90)
GLUCOSE: 237 mg/dL — AB (ref 70–99)
POTASSIUM: 3.9 mmol/L (ref 3.5–5.1)
Sodium: 138 mmol/L (ref 135–145)
Total Bilirubin: 0.5 mg/dL (ref 0.3–1.2)
Total Protein: 6.4 g/dL (ref 6.0–8.3)

## 2014-06-22 LAB — URINALYSIS, ROUTINE W REFLEX MICROSCOPIC
BILIRUBIN URINE: NEGATIVE
GLUCOSE, UA: NEGATIVE mg/dL
HGB URINE DIPSTICK: NEGATIVE
KETONES UR: NEGATIVE mg/dL
Nitrite: NEGATIVE
PH: 6.5 (ref 5.0–8.0)
PROTEIN: NEGATIVE mg/dL
Specific Gravity, Urine: 1.012 (ref 1.005–1.030)
Urobilinogen, UA: 0.2 mg/dL (ref 0.0–1.0)

## 2014-06-22 LAB — GLUCOSE, CAPILLARY
GLUCOSE-CAPILLARY: 189 mg/dL — AB (ref 70–99)
GLUCOSE-CAPILLARY: 182 mg/dL — AB (ref 70–99)

## 2014-06-22 LAB — APTT: aPTT: 36 seconds (ref 24–37)

## 2014-06-22 LAB — TROPONIN I
Troponin I: 0.14 ng/mL — ABNORMAL HIGH (ref <0.031)
Troponin I: 0.15 ng/mL — ABNORMAL HIGH (ref <0.031)

## 2014-06-22 LAB — MAGNESIUM: Magnesium: 2.4 mg/dL (ref 1.5–2.5)

## 2014-06-22 LAB — PROTIME-INR
INR: 1.33 (ref 0.00–1.49)
Prothrombin Time: 16.6 seconds — ABNORMAL HIGH (ref 11.6–15.2)

## 2014-06-22 LAB — BRAIN NATRIURETIC PEPTIDE: B Natriuretic Peptide: 447.7 pg/mL — ABNORMAL HIGH (ref 0.0–100.0)

## 2014-06-22 LAB — I-STAT TROPONIN, ED: TROPONIN I, POC: 0.14 ng/mL — AB (ref 0.00–0.08)

## 2014-06-22 MED ORDER — INSULIN ASPART 100 UNIT/ML ~~LOC~~ SOLN
0.0000 [IU] | Freq: Every day | SUBCUTANEOUS | Status: DC
Start: 2014-06-22 — End: 2014-07-03
  Administered 2014-06-24 – 2014-06-26 (×2): 2 [IU] via SUBCUTANEOUS
  Administered 2014-06-28: 3 [IU] via SUBCUTANEOUS
  Administered 2014-06-29 – 2014-07-02 (×4): 2 [IU] via SUBCUTANEOUS

## 2014-06-22 MED ORDER — POLYETHYLENE GLYCOL 3350 17 G PO PACK
17.0000 g | PACK | Freq: Two times a day (BID) | ORAL | Status: DC | PRN
  Administered 2014-06-23 – 2014-06-30 (×10): 17 g via ORAL
  Filled 2014-06-22 (×13): qty 1

## 2014-06-22 MED ORDER — ISOSORBIDE MONONITRATE ER 60 MG PO TB24
60.0000 mg | ORAL_TABLET | Freq: Every day | ORAL | Status: DC
  Administered 2014-06-23 – 2014-07-02 (×10): 60 mg via ORAL
  Filled 2014-06-22 (×10): qty 1

## 2014-06-22 MED ORDER — FUROSEMIDE 10 MG/ML IJ SOLN
80.0000 mg | Freq: Two times a day (BID) | INTRAMUSCULAR | Status: DC
  Administered 2014-06-22 – 2014-06-24 (×4): 80 mg via INTRAVENOUS
  Filled 2014-06-22 (×5): qty 8

## 2014-06-22 MED ORDER — ASPIRIN 81 MG PO CHEW
324.0000 mg | CHEWABLE_TABLET | Freq: Once | ORAL | Status: AC
  Administered 2014-06-22: 324 mg via ORAL
  Filled 2014-06-22: qty 4

## 2014-06-22 MED ORDER — ALLOPURINOL 100 MG PO TABS
100.0000 mg | ORAL_TABLET | Freq: Two times a day (BID) | ORAL | Status: DC
  Administered 2014-06-22 – 2014-07-03 (×22): 100 mg via ORAL
  Filled 2014-06-22 (×23): qty 1

## 2014-06-22 MED ORDER — SODIUM CHLORIDE 0.9 % IJ SOLN
3.0000 mL | Freq: Two times a day (BID) | INTRAMUSCULAR | Status: DC
  Administered 2014-06-22 – 2014-07-03 (×22): 3 mL via INTRAVENOUS

## 2014-06-22 MED ORDER — ONDANSETRON HCL 4 MG/2ML IJ SOLN
4.0000 mg | Freq: Four times a day (QID) | INTRAMUSCULAR | Status: DC | PRN
  Administered 2014-06-24 – 2014-07-01 (×4): 4 mg via INTRAVENOUS
  Filled 2014-06-22 (×5): qty 2

## 2014-06-22 MED ORDER — INSULIN ASPART 100 UNIT/ML ~~LOC~~ SOLN
0.0000 [IU] | Freq: Three times a day (TID) | SUBCUTANEOUS | Status: DC
  Administered 2014-06-23 (×3): 2 [IU] via SUBCUTANEOUS
  Administered 2014-06-24 (×2): 3 [IU] via SUBCUTANEOUS
  Administered 2014-06-24 – 2014-06-25 (×2): 2 [IU] via SUBCUTANEOUS
  Administered 2014-06-25 – 2014-06-26 (×3): 3 [IU] via SUBCUTANEOUS
  Administered 2014-06-26 (×2): 2 [IU] via SUBCUTANEOUS
  Administered 2014-06-27: 1 [IU] via SUBCUTANEOUS
  Administered 2014-06-27: 3 [IU] via SUBCUTANEOUS
  Administered 2014-06-27: 5 [IU] via SUBCUTANEOUS
  Administered 2014-06-28: 3 [IU] via SUBCUTANEOUS
  Administered 2014-06-28: 1 [IU] via SUBCUTANEOUS
  Administered 2014-06-28: 2 [IU] via SUBCUTANEOUS
  Administered 2014-06-29: 3 [IU] via SUBCUTANEOUS
  Administered 2014-06-29: 2 [IU] via SUBCUTANEOUS
  Administered 2014-06-30: 3 [IU] via SUBCUTANEOUS
  Administered 2014-06-30: 1 [IU] via SUBCUTANEOUS
  Administered 2014-06-30 – 2014-07-01 (×3): 3 [IU] via SUBCUTANEOUS
  Administered 2014-07-02: 1 [IU] via SUBCUTANEOUS
  Administered 2014-07-02 (×2): 3 [IU] via SUBCUTANEOUS
  Administered 2014-07-03: 2 [IU] via SUBCUTANEOUS

## 2014-06-22 MED ORDER — ACETAMINOPHEN 325 MG PO TABS: 650.0000 mg | ORAL_TABLET | ORAL | Status: DC | PRN

## 2014-06-22 MED ORDER — FERROUS SULFATE 325 (65 FE) MG PO TABS
325.0000 mg | ORAL_TABLET | ORAL | Status: DC
  Administered 2014-06-23 – 2014-07-03 (×6): 325 mg via ORAL
  Filled 2014-06-22 (×6): qty 1

## 2014-06-22 MED ORDER — TAMSULOSIN HCL 0.4 MG PO CAPS
0.8000 mg | ORAL_CAPSULE | Freq: Every day | ORAL | Status: DC
  Administered 2014-06-23 – 2014-07-02 (×10): 0.8 mg via ORAL
  Filled 2014-06-22 (×12): qty 2

## 2014-06-22 MED ORDER — ADULT MULTIVITAMIN W/MINERALS CH
1.0000 | ORAL_TABLET | Freq: Every day | ORAL | Status: DC
  Administered 2014-06-23 – 2014-07-03 (×11): 1 via ORAL
  Filled 2014-06-22 (×11): qty 1

## 2014-06-22 MED ORDER — LATANOPROST 0.005 % OP SOLN
1.0000 [drp] | Freq: Every day | OPHTHALMIC | Status: DC
  Filled 2014-06-22: qty 2.5

## 2014-06-22 MED ORDER — FUROSEMIDE 10 MG/ML IJ SOLN
40.0000 mg | Freq: Once | INTRAMUSCULAR | Status: AC
  Administered 2014-06-22: 40 mg via INTRAVENOUS
  Filled 2014-06-22: qty 4

## 2014-06-22 MED ORDER — APIXABAN 2.5 MG PO TABS
2.5000 mg | ORAL_TABLET | Freq: Two times a day (BID) | ORAL | Status: DC
  Administered 2014-06-22 – 2014-07-03 (×22): 2.5 mg via ORAL
  Filled 2014-06-22 (×23): qty 1

## 2014-06-22 MED ORDER — FUROSEMIDE 10 MG/ML IJ SOLN
40.0000 mg | INTRAMUSCULAR | Status: AC
  Administered 2014-06-22: 40 mg via INTRAVENOUS
  Filled 2014-06-22: qty 4

## 2014-06-22 MED ORDER — NITROGLYCERIN 0.4 MG SL SUBL: 0.4000 mg | SUBLINGUAL_TABLET | SUBLINGUAL | Status: DC | PRN

## 2014-06-22 MED ORDER — POTASSIUM CHLORIDE CRYS ER 20 MEQ PO TBCR
20.0000 meq | EXTENDED_RELEASE_TABLET | Freq: Every day | ORAL | Status: DC
  Administered 2014-06-23 – 2014-06-24 (×2): 20 meq via ORAL
  Filled 2014-06-22 (×2): qty 1

## 2014-06-22 MED ORDER — SODIUM CHLORIDE 0.9 % IV SOLN
250.0000 mL | INTRAVENOUS | Status: DC | PRN
  Administered 2014-06-28: 250 mL via INTRAVENOUS

## 2014-06-22 MED ORDER — DUTASTERIDE 0.5 MG PO CAPS
0.5000 mg | ORAL_CAPSULE | Freq: Every day | ORAL | Status: DC
  Administered 2014-06-23 – 2014-07-03 (×11): 0.5 mg via ORAL
  Filled 2014-06-22 (×11): qty 1

## 2014-06-22 MED ORDER — METOPROLOL TARTRATE 25 MG PO TABS
25.0000 mg | ORAL_TABLET | Freq: Every day | ORAL | Status: DC
  Administered 2014-06-23 – 2014-07-03 (×11): 25 mg via ORAL
  Filled 2014-06-22 (×11): qty 1

## 2014-06-22 MED ORDER — SODIUM CHLORIDE 0.9 % IJ SOLN: 3.0000 mL | INTRAMUSCULAR | Status: DC | PRN

## 2014-06-22 MED ORDER — INSULIN GLARGINE 100 UNIT/ML ~~LOC~~ SOLN
6.0000 [IU] | Freq: Every day | SUBCUTANEOUS | Status: DC
  Administered 2014-06-22 – 2014-07-02 (×11): 6 [IU] via SUBCUTANEOUS
  Filled 2014-06-22 (×12): qty 0.06

## 2014-06-22 MED ORDER — AMIODARONE HCL 200 MG PO TABS
200.0000 mg | ORAL_TABLET | Freq: Every day | ORAL | Status: DC
  Administered 2014-06-22 – 2014-07-02 (×11): 200 mg via ORAL
  Filled 2014-06-22 (×12): qty 1

## 2014-06-22 MED ORDER — POLYVINYL ALCOHOL 1.4 % OP SOLN
1.0000 [drp] | Freq: Two times a day (BID) | OPHTHALMIC | Status: DC
  Administered 2014-06-24 – 2014-07-01 (×5): 1 [drp] via OPHTHALMIC
  Filled 2014-06-22: qty 15

## 2014-06-22 NOTE — Plan of Care (Signed)
Problem: Phase I Progression Outcomes Goal: Voiding-avoid urinary catheter unless indicated Outcome: Not Met (add Reason) Pt has urinary retention

## 2014-06-22 NOTE — ED Notes (Signed)
2 warm blankets given

## 2014-06-22 NOTE — ED Provider Notes (Signed)
CSN: [id number]     Arrival date & time 06/22/14  1038 History   First MD Initiated Contact with Patient 06/22/14 1042     Chief Complaint  Patient presents with  . Shortness of Breath     (Consider location/radiation/quality/duration/timing/severity/associated sxs/prior Treatment) HPI Comments: The patient is a 81 year old male, history of congestive heart failure with an ejection fraction of 45-50% as measured by echocardiogram in August 2015. He presents with a complaint of shortness of breath, dyspnea on exertion this morning. He states that he feels slightly better when he is resting but worse when exerting himself. He also has gradual onset of persistent and worsening lower extremity edema. He does wear compression stockings, have been swelling despite that. He has recently had a urinary catheter removed that he had placed for urinary retention as well as had his diuretic torsemide increased yesterday. The patient has had increased difficult A lying flat. The symptoms are gradually worsening, the family member state that he is very short of breath at home.  Patient is a 81 y.o. male presenting with shortness of breath. The history is provided by the patient, a relative and medical records.  Shortness of Breath   Past Medical History  Diagnosis Date  . Hyperlipidemia   . Coronary artery disease   . Spinal stenosis   . Pneumonia   . Anemia   . Carotid artery disease   . PVD (peripheral vascular disease)   . Hyponatremia     Chronic  . CVA (cerebral infarction)   . CKD (chronic kidney disease) stage 4, GFR 15-29 ml/min   . Long term (current) use of anticoagulants     No bleeding on Eliquis  . PAF (paroxysmal atrial fibrillation)     Asymptomatic. On Eliquis.  . Sick sinus syndrome     With DDD St. Jude PM, initially placed in 1993 by Dr. Nils Pyle. Device upgrade 10/2002 to DDD, by Dr. Rollene Fare complicated by bleeding.  [name] Hypertensive cardiovascular disease   . CHF (congestive  heart failure)   . Chronic diastolic congestive heart failure     ECHO 05/20/12 LVEF estimated by 2D at 55-60%  . Arthritis   . Shortness of breath   . Gait disorder 03/31/2014  . Diabetic peripheral neuropathy associated with type 2 diabetes mellitus 03/31/2014  . Diabetes mellitus     insulin dependent   Past Surgical History  Procedure Laterality Date  . Lumbar laminectomy  1995  . Back surgery    . Total knee arthroplasty Left   . Quadriceps tendon repair    . Cataract extraction    . Carotid endarterectomy    . Yag laser application Right 8/67/6195    Procedure: YAG LASER CAPSULOTOMY OF RIGHT EYE;  Surgeon: [name], MD;  Location: [place];  Service: Ophthalmology;  Laterality: Right;  . Tonsillectomy    . Pacemaker insertion      DDD St. Jude PM, initially placed in 1993 by Dr. Nils Pyle. Device upgrade 10/2002 to DDD, by Dr. Rollene Fare complicated by bleeding.  . Carotid endarterectomy Right 2006  . Transurethral resection of prostate    . Cardioversion N/A 06/16/2013    Procedure: CARDIOVERSION BEDSIDE;  Surgeon: [name], MD;  Location: [place];  Service: Cardiovascular;  Laterality: N/A;  . Pacemaker generator change  12/09/2009    SJM Accent DR RF gen change by Dr [name]   Family History  Problem Relation Age of Onset  . Multiple myeloma Father   .  Hypertension Mother    History  Substance Use Topics  . Smoking status: Former Smoker -- 2 years    Types: Pipe    Quit date: 05/14/1958  . Smokeless tobacco: Never Used  . Alcohol Use: No     Comment: Cocktails occasionally    Review of Systems  Respiratory: Positive for shortness of breath.   All other systems reviewed and are negative.     Allergies  Aggrenox; Carvedilol; Claritin; Mucinex; Plavix; Rapaflo; Statins; and Travatan z  Home Medications   Prior to Admission medications   Medication Sig Start Date End Date Taking? Authorizing Provider  allopurinol (ZYLOPRIM) 100 MG tablet Take  200 mg by mouth daily.     Historical Provider, MD  amiodarone (PACERONE) 200 MG tablet Take 1 tablet (200 mg total) by mouth at bedtime. 01/01/14   Bary Leriche, PA-C  apixaban (ELIQUIS) 2.5 MG TABS tablet Take 1 tablet (2.5 mg total) by mouth 2 (two) times daily. 05/21/14   Belva Crome III, MD  dutasteride (AVODART) 0.5 MG capsule Take 0.5 mg by mouth daily.     Historical Provider, MD  ferrous sulfate 325 (65 FE) MG tablet Take 325 mg by mouth every other day.     Historical Provider, MD  Flaxseed, Linseed, (BL FLAX SEED OIL PO) Take 1 capsule by mouth 2 (two) times daily.    Historical Provider, MD  hydrALAZINE (APRESOLINE) 25 MG tablet Take 25 mg by mouth every evening.    Historical Provider, MD  insulin aspart (NOVOLOG FLEXPEN) 100 UNIT/ML FlexPen Inject 2-9 Units into the skin 3 (three) times daily with meals. Sliding scale    Historical Provider, MD  insulin glargine (LANTUS) 100 UNIT/ML injection Inject 9-10 Units into the skin at bedtime.     Historical Provider, MD  isosorbide mononitrate (IMDUR) 60 MG 24 hr tablet Take 60 mg by mouth daily.  05/18/14   Historical Provider, MD  metoprolol tartrate (LOPRESSOR) 25 MG tablet Take 1 tablet (25 mg total) by mouth daily. 06/21/14   Belva Crome III, MD  Multiple Vitamin (MULTIVITAMIN WITH MINERALS) TABS Take 1 tablet by mouth daily. Diabetic pak by Petra Kuba from [place] Provider, MD  nitroGLYCERIN (NITROSTAT) 0.4 MG SL tablet Place 1 tablet (0.4 mg total) under the tongue every 5 (five) minutes as needed for chest pain. 12/03/13   Belva Crome III, MD  polyethylene glycol [facility] / Floria Raveling) packet Take 17 g by mouth 2 (two) times daily as needed for mild constipation. For constipation    Historical Provider, MD  Polyvinyl Alcohol-Povidone (REFRESH OP) Place 1 drop into both eyes 2 (two) times daily. Refresh Gel Opth    Historical Provider, MD  potassium chloride SA (K-DUR,KLOR-CON) 20 MEQ tablet Take 20 mEq by mouth daily.  01/15/14    Historical Provider, MD  silodosin (RAPAFLO) 8 MG CAPS capsule Take 8 mg by mouth daily with breakfast.    Historical Provider, MD  Tafluprost (ZIOPTAN) 0.0015 % SOLN Place 1 drop into both eyes at bedtime. Use at night    Historical Provider, MD  torsemide (DEMADEX) 20 MG tablet Take 59m in the AM and 857min the PM 05/29/14   Shanker M Kristeen MansMD   BP 131/75 mmHg  Pulse 59  Temp(Src) 97.6 F (36.4 C) (Oral)  Resp 27  SpO2 94% Physical Exam  Constitutional: He appears well-developed and well-nourished. No distress.  HENT:  Head: Normocephalic and atraumatic.  Mouth/Throat: Oropharynx is clear  and moist. No oropharyngeal exudate.  Eyes: Conjunctivae and EOM are normal. Pupils are equal, round, and reactive to light. Right eye exhibits no discharge. Left eye exhibits no discharge. No scleral icterus.  Neck: Normal range of motion. Neck supple. No JVD present. No thyromegaly present.  Cardiovascular: Normal rate, regular rhythm, normal heart sounds and intact distal pulses.  Exam reveals no gallop and no friction rub.   No murmur heard. Pulmonary/Chest: Effort normal. No respiratory distress. He has wheezes. He has rales.  Mild expiratory wheeze  Abdominal: Soft. Bowel sounds are normal. He exhibits no distension and no mass. There is no tenderness.  Musculoskeletal: Normal range of motion. He exhibits edema ( Bilateral 2+ pitting edema, no asymmetry). He exhibits no tenderness.  Lymphadenopathy:    He has no cervical adenopathy.  Neurological: He is alert. Coordination normal.  Skin: Skin is warm and dry. No rash noted. No erythema.  Psychiatric: He has a normal mood and affect. His behavior is normal.  Nursing note and vitals reviewed.   ED Course  Procedures (including critical care time) Labs Review Labs Reviewed  CBC - Abnormal; Notable for the following:    RBC 3.15 (*)    Hemoglobin 10.2 (*)    HCT 31.7 (*)    MCV 100.6 (*)    RDW 15.9 (*)    All other components  within normal limits  I-STAT TROPOININ, ED - Abnormal; Notable for the following:    Troponin i, poc 0.14 (*)    All other components within normal limits  APTT  COMPREHENSIVE METABOLIC PANEL  PROTIME-INR  BRAIN NATRIURETIC PEPTIDE  TROPONIN I    Imaging Review Dg Chest Portable 1 View  06/22/2014   CLINICAL DATA:  81 year old male with shortness of Breath. Initial encounter.  EXAM: PORTABLE CHEST - 1 VIEW  COMPARISON:  05/27/2014 and earlier.  FINDINGS: Portable AP semi upright view at 1101 hours. Continued widespread interstitial and veiling pulmonary opacity. Stable cardiac size and mediastinal contours. No pneumothorax. No air bronchograms identified. Visualized tracheal air column is within normal limits. Stable left chest cardiac pacemaker.  IMPRESSION: Pulmonary edema and bilateral pleural effusions with lung base atelectasis.   Electronically Signed   By: Genevie Ann M.D.   On: 06/22/2014 11:34     EKG Interpretation   Date/Time:  Tuesday June 22 2014 10:52:59 EST Ventricular Rate:  68 PR Interval:  343 QRS Duration: 106 QT Interval:  472 QTC Calculation: 502 R Axis:   14 Text Interpretation:  Atrial-paced rhythm Low voltage, extremity leads  Nonspecific T abnormalities, lateral leads Prolonged QT interval Baseline  wander in lead(s) V5 since last tracing no significant change Confirmed by  Deuntae Kocsis  MD, Jhovani Griswold ([id number]) on 06/22/2014 11:06:51 AM      MDM   Final diagnoses:  CHF exacerbation    The patient doesn't appear to be dyspneic, he has expiratory wheezing and rales consistent with fluid overload and cardiac wheeze. Check EKG, labs, diuresis, anticipate cardiology consultation. The patient and family member state that they were in contact with cardiology, [name] this morning. The patient is anticoagulated on Eliquis.  Discussed with cardiology who will admit the patient to the hospital. Lasix ordered, chest x-ray reveals ulnar edema.  Johnna Acosta, MD 06/22/14  [phone]

## 2014-06-22 NOTE — ED Notes (Signed)
Pt had periods of decreased SpO2 down to 87% RA.  Placed on 2 L nasal cannula.

## 2014-06-22 NOTE — ED Notes (Signed)
Troponin results given to Dr. Sabra Heck

## 2014-06-22 NOTE — H&P (Signed)
[name] is an 81 y.o. male.    Primary Cardiologist: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 He was seen in the office  06/16/14 and was treated with intensified diuretic regimen (Demadex 80 mg by mouth twice a day) because of increased weight and dyspnea until weight back to 186. His urine output was low and pt did self cath without much urine.  Pt was also lethargic.  Labs checked were stable BUN 31 Cr. 2.44.   Yesterday with increased wt to 190.3 so he took 100 mg of demadex per [name]  And this AM 100 mg his weight is up despite this to 190.7.  He was SOB this AM had very mild chest tightness.  He was going to work but brought to ER instead for the SOB.  Dr. Carlis Abbott saw him for the lethargy and his hydralazine was held.  He feels better off the hydralazine.   Presents today with SOB and pulmonary edema on CXR.  He denies chest pain-only mild chest pressure. He has urinary retention and is now on 2 flomax a day per Dr. Janice Norrie and he does in and out self caths at times.  Recently not passing much urine.    In ER Troponin elevated 0.14 similar to last hospitalization 1/14-16/16 thought to be insignificant with acute CHF though we will follow.  EKG atrial pacing v sensing, no acute changes and on tele he has AV sensing and Pacing.  His CHA2DS2VASc is 7 and he is on Eliquis.   Last echo 12/2013 with EF 45-50% + aortic sclerosis, PA pk pressure 47mHg.   Past Medical History  Diagnosis Date  . Hyperlipidemia   . Coronary artery disease   . Spinal stenosis   . Pneumonia   . Anemia   . Carotid artery disease   . PVD (peripheral vascular disease)   . Hyponatremia     Chronic  . CVA (cerebral infarction)   . CKD (chronic kidney disease) stage 4, GFR 15-29 ml/min   . Long term (current) use of anticoagulants     No bleeding on Eliquis  . PAF  (paroxysmal atrial fibrillation)     Asymptomatic. On Eliquis.  . Sick sinus syndrome     With DDD St. Jude PM, initially placed in 1993 by Dr. NNils Pyle Device upgrade 10/2002 to DDD, by Dr. WRollene Farecomplicated by bleeding.  .Marland KitchenHypertensive cardiovascular disease   . CHF (congestive heart failure)   . Chronic diastolic congestive heart failure     ECHO 05/20/12 LVEF estimated by 2D at 55-60%  . Arthritis   . Shortness of breath   . Gait disorder 03/31/2014  . Diabetic peripheral neuropathy associated with type 2 diabetes mellitus 03/31/2014  . Diabetes mellitus     insulin dependent  . Elevated troponin I level 06/22/2014    Past Surgical History  Procedure Laterality Date  . Lumbar laminectomy  1995  . Back surgery    . Total knee arthroplasty Left   . Quadriceps tendon repair    . Cataract extraction    . Carotid endarterectomy    . Yag laser application Right 63/35/8251   Procedure: YAG LASER CAPSULOTOMY OF RIGHT EYE;  Surgeon: TMyrtha Mantis, MD;  [place]  Service: Ophthalmology;  Laterality: Right;  . Tonsillectomy    . Pacemaker  insertion      DDD St. Jude PM, initially placed in 1993 by Dr. Nils Pyle. Device upgrade 10/2002 to DDD, by Dr. Rollene Fare complicated by bleeding.  . Carotid endarterectomy Right 2006  . Transurethral resection of prostate    . Cardioversion N/A 06/16/2013    Procedure: CARDIOVERSION BEDSIDE;  Surgeon: [name], MD;  Location: [place];  Service: Cardiovascular;  Laterality: N/A;  . Pacemaker generator change  12/09/2009    SJM Accent DR RF gen change by Dr [name]    Family History  Problem Relation Age of Onset  . Multiple myeloma Father   . Hypertension Mother    Social History:  reports that he quit smoking about 56 years ago. His smoking use included Pipe. He has never used smokeless tobacco. He reports that he does not drink alcohol or use illicit drugs.  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  Allergies:  Allergies    Allergen Reactions  . Aggrenox [Aspirin-Dipyridamole Er] Other (See Comments)    Dizziness  . Carvedilol Other (See Comments)    Dizziness  . Claritin [Loratadine] Other (See Comments)    Dizziness & drowsiness   . Mucinex [Guaifenesin Er] Other (See Comments)    Dizziness, drowsiness  . Plavix [Clopidogrel Bisulfate] Other (See Comments)    Dizziness  . Rapaflo [Silodosin] Other (See Comments)    Dizziness for about 30- 45  minutes  . Statins Other (See Comments)    rhabdomyolisis  . Travatan Z [Travoprost] Other (See Comments)    Eye irritation    Outpatient Medications: No current facility-administered medications on file prior to encounter.   Current Outpatient Prescriptions on File Prior to Encounter  Medication Sig Dispense Refill  . allopurinol (ZYLOPRIM) 100 MG tablet Take 100 mg by mouth 2 (two) times daily.     [name] amiodarone (PACERONE) 200 MG tablet Take 1 tablet (200 mg total) by mouth at bedtime.    [name] apixaban (ELIQUIS) 2.5 MG TABS tablet Take 1 tablet (2.5 mg total) by mouth 2 (two) times daily. 60 tablet 5  . dutasteride (AVODART) 0.5 MG capsule Take 0.5 mg by mouth daily.     . ferrous sulfate 325 (65 FE) MG tablet Take 325 mg by mouth every other day.     . insulin aspart (NOVOLOG FLEXPEN) 100 UNIT/ML FlexPen Inject 2-9 Units into the skin 3 (three) times daily with meals. Sliding scale If at lunch gbc is between 159 do not give any. If 299 or above give 3 units. At bedtime it is always 9 units    . insulin glargine (LANTUS) 100 UNIT/ML injection Inject 9-10 Units into the skin at bedtime.     . isosorbide mononitrate (IMDUR) 60 MG 24 hr tablet Take 60 mg by mouth daily.     . metoprolol tartrate (LOPRESSOR) 25 MG tablet Take 1 tablet (25 mg total) by mouth daily. 30 tablet 6  . Multiple Vitamin (MULTIVITAMIN WITH MINERALS) TABS Take 1 tablet by mouth daily. Diabetic pak by Petra Kuba from [employer]    . polyethylene glycol (MIRALAX / GLYCOLAX) packet Take 17 g by mouth 2  (two) times daily as needed for mild constipation. For constipation    . Polyvinyl Alcohol-Povidone (REFRESH OP) Place 1 drop into both eyes 2 (two) times daily. Refresh Gel Opth    . potassium chloride SA (K-DUR,KLOR-CON) 20 MEQ tablet Take 20 mEq by mouth daily.     . Tafluprost (ZIOPTAN) 0.0015 % SOLN Place 1 drop  into both eyes at bedtime. Use at night    . torsemide (DEMADEX) 20 MG tablet Take 70m in the AM and 882min the PM 200 tablet 0  . Flaxseed, Linseed, (BL FLAX SEED OIL PO) Take 1 capsule by mouth 2 (two) times daily.    . hydrALAZINE (APRESOLINE) 25 MG tablet Take 25 mg by mouth every evening.    . nitroGLYCERIN (NITROSTAT) 0.4 MG SL tablet Place 1 tablet (0.4 mg total) under the tongue every 5 (five) minutes as needed for chest pain. 25 tablet 3  . silodosin (RAPAFLO) 8 MG CAPS capsule Take 8 mg by mouth daily with breakfast.      Results for orders placed or performed during the hospital encounter of 06/22/14 (from the past 48 hour(s))  APTT     Status: None   Collection Time: 06/22/14 11:40 AM  Result Value Ref Range   aPTT 36 24 - 37 seconds  CBC     Status: Abnormal   Collection Time: 06/22/14 11:40 AM  Result Value Ref Range   WBC 5.2 4.0 - 10.5 K/uL   RBC 3.15 (L) 4.22 - 5.81 MIL/uL   Hemoglobin 10.2 (L) 13.0 - 17.0 g/dL   HCT 31.7 (L) 39.0 - 52.0 %   MCV 100.6 (H) 78.0 - 100.0 fL   MCH 32.4 26.0 - 34.0 pg   MCHC 32.2 30.0 - 36.0 g/dL   RDW 15.9 (H) 11.5 - 15.5 %   Platelets 180 150 - 400 K/uL  Comprehensive metabolic panel     Status: Abnormal   Collection Time: 06/22/14 11:40 AM  Result Value Ref Range   Sodium 138 135 - 145 mmol/L   Potassium 3.9 3.5 - 5.1 mmol/L   Chloride 100 96 - 112 mmol/L   CO2 28 19 - 32 mmol/L   Glucose, Bld 237 (H) 70 - 99 mg/dL   BUN 34 (H) 6 - 23 mg/dL   Creatinine, Ser 2.53 (H) 0.50 - 1.35 mg/dL   Calcium 9.0 8.4 - 10.5 mg/dL   Total Protein 6.4 6.0 - 8.3 g/dL   Albumin 3.1 (L) 3.5 - 5.2 g/dL   AST 49 (H) 0 - 37 U/L   ALT  38 0 - 53 U/L   Alkaline Phosphatase 56 39 - 117 U/L   Total Bilirubin 0.5 0.3 - 1.2 mg/dL   GFR calc non Af Amer 20 (L) >90 mL/min   GFR calc Af Amer 24 (L) >90 mL/min    Comment: (NOTE) The eGFR has been calculated using the CKD EPI equation. This calculation has not been validated in all clinical situations. eGFR's persistently <90 mL/min signify possible Chronic Kidney Disease.    Anion gap 10 5 - 15  Protime-INR     Status: Abnormal   Collection Time: 06/22/14 11:40 AM  Result Value Ref Range   Prothrombin Time 16.6 (H) 11.6 - 15.2 seconds   INR 1.33 0.00 - 1.49  Brain natriuretic peptide     Status: Abnormal   Collection Time: 06/22/14 11:40 AM  Result Value Ref Range   B Natriuretic Peptide 447.7 (H) 0.0 - 100.0 pg/mL  Troponin I     Status: Abnormal   Collection Time: 06/22/14 11:40 AM  Result Value Ref Range   Troponin I 0.14 (H) <0.031 ng/mL    Comment:        PERSISTENTLY INCREASED TROPONIN VALUES IN THE RANGE OF 0.04-0.49 ng/mL CAN BE SEEN IN:       -UNSTABLE ANGINA       -  CONGESTIVE HEART FAILURE       -MYOCARDITIS       -CHEST TRAUMA       -ARRYHTHMIAS       -LATE PRESENTING MYOCARDIAL INFARCTION       -COPD   CLINICAL FOLLOW-UP RECOMMENDED.   I-stat troponin, ED     Status: Abnormal   Collection Time: 06/22/14 11:50 AM  Result Value Ref Range   Troponin i, poc 0.14 (HH) 0.00 - 0.08 ng/mL   Comment NOTIFIED PHYSICIAN    Comment 3            Comment: Due to the release kinetics of cTnI, a negative result within the first hours of the onset of symptoms does not rule out myocardial infarction with certainty. If myocardial infarction is still suspected, repeat the test at appropriate intervals.    Dg Chest Portable 1 View  06/22/2014   CLINICAL DATA:  81 year old male with shortness of Breath. Initial encounter.  EXAM: PORTABLE CHEST - 1 VIEW  COMPARISON:  05/27/2014 and earlier.  FINDINGS: Portable AP semi upright view at 1101 hours. Continued widespread  interstitial and veiling pulmonary opacity. Stable cardiac size and mediastinal contours. No pneumothorax. No air bronchograms identified. Visualized tracheal air column is within normal limits. Stable left chest cardiac pacemaker.  IMPRESSION: Pulmonary edema and bilateral pleural effusions with lung base atelectasis.   Electronically Signed   By: Genevie Ann M.D.   On: 06/22/2014 11:34     ROS: General:no colds or fevers,  weight increase- from 186 his low dry weight until today 190.7 Skin:no rashes or ulcers HEENT:no blurred vision, no congestion CV:see HPI PUL:see HPI GI:no diarrhea constipation or melena, no indigestion GU:no hematuria, no dysuria, + urinary retention.  MS:no joint pain, no claudication Neuro:no syncope, no lightheadedness Endo:+ diabetes, no thyroid disease   Blood pressure 131/75, pulse 64, temperature 97.6 F (36.4 C), temperature source Oral, resp. rate 21, SpO2 99 %. PE: General:Pleasant affect, NAD, SOB with talking Skin:Warm and dry, brisk capillary refill HEENT:normocephalic, sclera clear, mucus membranes moist Neck:supple, no JVD though thick neck, no bruits, no adenopathy  Heart:S1S2 RRR without murmur, gallup, rub or click Lungs:diminished in bases to 1/2 up,  without rales, rhonchi, occ wheezes MLY:YTKPT, soft, non tender, + BS, do not palpate liver spleen or masses Ext:1-2+  lower ext edema of feet and ankles, 2+ pedal pulses, 2+ radial pulses Neuro:alert and oriented X 3, MAE, follows commands, + facial symmetry    Assessment/Plan Principal Problem:   Acute on chronic diastolic CHF (congestive heart failure), NYHA class 3- has increased his demadex but still with increasing weight.  Will hold demadex and give 80 mg IV lasix every 12 hours.  Will need to follow Cr. closely  BNP 447  Active Problems:   Chronic diastolic heart failure- on hydralazine recently held due to lethargy will need to address his meds- on imdur 60 mg- when held he developed  anginal symptoms; lopressor 25 mg daily; hydralazine 25 mg hs held recently- last day or so will hold for now.    Paroxysmal atrial fibrillation, DCCV 06/16/13- maintaining SR on amiodarone   Chronic anticoagulation- on eliquis for PAF    Pacemaker - St Jude- AV pacing and sensing   CKD (chronic kidney disease) stage 4, GFR 15-29 ml/min Cr 2.44 on the 5th now 2.53   Elevated troponin I level 0.14 wil recheck in 6 hours   Urinary retention- 400cc today with no urge to void, recent flomax added at  0.4 mg two every HS. Will plan for foley   DM-2 with peripheral neuropathy on insulin    [facility] R Nurse Practitioner Certified [place] Pager [phone] or after 5pm or weekends call [phone] 06/22/2014, 2:13 PM  Patient seen and examined and history reviewed. Agree with above findings and plan. 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 CXR shows pulmonary edema. BNP is elevated. Creatinine is increased over baseline. Part of this is the high dose demadex and part due to urinary retention. Bladder scan revealed 450 cc urine. I think he will benefit from IV lasix. Will hold hydralazine for now. Put foley in place while in hospital and monitor renal indices closely. Troponin elevation is due to demand ischemia with CHF.   Findlay Dagher [place], [place] 06/22/2014 3:37 PM

## 2014-06-22 NOTE — ED Notes (Signed)
Pt from home with c/o increased SOB since this past Sat.  Pt believes he is having a CHF exacerbation.  Pt in NAD, A&O.

## 2014-06-23 ENCOUNTER — Encounter (HOSPITAL_COMMUNITY): Payer: Self-pay | Admitting: General Practice

## 2014-06-23 DIAGNOSIS — R7989 Other specified abnormal findings of blood chemistry: Secondary | ICD-10-CM

## 2014-06-23 LAB — BASIC METABOLIC PANEL
ANION GAP: 8 (ref 5–15)
BUN: 31 mg/dL — ABNORMAL HIGH (ref 6–23)
CHLORIDE: 100 mmol/L (ref 96–112)
CO2: 32 mmol/L (ref 19–32)
Calcium: 8.7 mg/dL (ref 8.4–10.5)
Creatinine, Ser: 2.35 mg/dL — ABNORMAL HIGH (ref 0.50–1.35)
GFR calc non Af Amer: 22 mL/min — ABNORMAL LOW (ref >90)
GFR, EST AFRICAN AMERICAN: 26 mL/min — AB (ref >90)
Glucose, Bld: 188 mg/dL — ABNORMAL HIGH (ref 70–99)
Potassium: 3.4 mmol/L — ABNORMAL LOW (ref 3.5–5.1)
Sodium: 140 mmol/L (ref 135–145)

## 2014-06-23 LAB — GLUCOSE, CAPILLARY
GLUCOSE-CAPILLARY: 164 mg/dL — AB (ref 70–99)
GLUCOSE-CAPILLARY: 155 mg/dL — AB (ref 70–99)
GLUCOSE-CAPILLARY: 110 mg/dL — AB (ref 70–99)
Glucose-Capillary: 166 mg/dL — ABNORMAL HIGH (ref 70–99)

## 2014-06-23 LAB — TROPONIN I: Troponin I: 0.14 ng/mL — ABNORMAL HIGH (ref <0.031)

## 2014-06-23 MED ORDER — POTASSIUM CHLORIDE CRYS ER 20 MEQ PO TBCR
20.0000 meq | EXTENDED_RELEASE_TABLET | Freq: Once | ORAL | Status: AC
  Administered 2014-06-23: 20 meq via ORAL

## 2014-06-23 NOTE — Progress Notes (Signed)
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    Subjective: Feeling much better. Breathing improved. No CP. Wants to go home soon  Objective: Vital signs in last 24 hours: Temp:  [97.4 F (36.3 C)-97.9 F (36.6 C)] 97.9 F (36.6 C) (02/10 0517) Pulse Rate:  [59-72] 70 (02/10 0517) Resp:  [13-27] 20 (02/10 0517) BP: (114-146)/(55-81) 135/71 mmHg (02/10 0517) SpO2:  [87 %-100 %] 100 % (02/10 0517) FiO2 (%):  [21 %] 21 % (02/09 1545) Weight:  [193 lb 10.8 oz (87.851 kg)-196 lb 1.6 oz (88.95 kg)] 193 lb 10.8 oz (87.851 kg) (02/10 0517) Last BM Date: 06/22/14  Intake/Output from previous day: 02/09 0701 - 02/10 0700 In: 360 [P.O.:360] Out: 2330 [Urine:2330] Intake/Output this shift:    Medications Current Facility-Administered Medications  Medication Dose Route Frequency Provider Last Rate Last Dose  . 0.9 %  sodium chloride infusion  250 mL Intravenous PRN Isaiah Serge, NP      . acetaminophen (TYLENOL) tablet 650 mg  650 mg Oral Q4H PRN Isaiah Serge, NP      . allopurinol (ZYLOPRIM) tablet 100 mg  100 mg Oral BID Isaiah Serge, NP   100 mg at 06/22/14 2232  . amiodarone (PACERONE) tablet 200 mg  200 mg Oral QHS Isaiah Serge, NP   200 mg at 06/22/14 2231  . apixaban (ELIQUIS) tablet 2.5 mg  2.5 mg Oral BID Isaiah Serge, NP   2.5 mg at 06/22/14 2232  . dutasteride (AVODART) capsule 0.5 mg  0.5 mg Oral Daily Isaiah Serge, NP      . ferrous sulfate tablet 325 mg  325 mg Oral QODAY Isaiah Serge, NP      . furosemide (LASIX) injection 80 mg  80 mg Intravenous Q12H Isaiah Serge, NP   80 mg at 06/22/14 2235  . insulin aspart (novoLOG) injection 0-5 Units  0-5 Units Subcutaneous QHS Isaiah Serge, NP   0 Units at 06/22/14 2200  . insulin aspart (novoLOG) injection 0-9 Units  0-9 Units Subcutaneous  TID WC Isaiah Serge, NP   2 Units at 06/23/14 [phone]  . insulin glargine (LANTUS) injection 6 Units  6 Units Subcutaneous QHS Isaiah Serge, NP   6 Units at 06/22/14 2232  . isosorbide mononitrate (IMDUR) 24 hr tablet 60 mg  60 mg Oral Daily Isaiah Serge, NP      . latanoprost (XALATAN) 0.005 % ophthalmic solution 1 drop  1 drop Both Eyes QHS Isaiah Serge, NP   1 drop at 06/22/14 2200  . metoprolol tartrate (LOPRESSOR) tablet 25 mg  25 mg Oral Daily Isaiah Serge, NP   25 mg at 06/22/14 1618  . multivitamin with minerals tablet 1 tablet  1 tablet Oral Daily Isaiah Serge, NP   1 tablet at 06/22/14 1620  . nitroGLYCERIN (NITROSTAT) SL tablet 0.4 mg  0.4 mg Sublingual Q5 min PRN Isaiah Serge, NP      . ondansetron [facility]) injection 4 mg  4 mg Intravenous Q6H PRN Isaiah Serge, NP      . polyethylene glycol (MIRALAX / GLYCOLAX) packet 17 g  17 g Oral BID PRN Isaiah Serge, NP   17 g at 06/23/14 0005  . polyvinyl alcohol (LIQUIFILM TEARS) 1.4 %  ophthalmic solution 1 drop  1 drop Both Eyes BID Isaiah Serge, NP   1 drop at 06/22/14 2200  . potassium chloride SA (K-DUR,KLOR-CON) CR tablet 20 mEq  20 mEq Oral Daily Isaiah Serge, NP   20 mEq at 06/22/14 1621  . sodium chloride 0.9 % injection 3 mL  3 mL Intravenous Q12H Isaiah Serge, NP   3 mL at 06/22/14 2232  . sodium chloride 0.9 % injection 3 mL  3 mL Intravenous PRN Isaiah Serge, NP      . tamsulosin [facility]) capsule 0.8 mg  0.8 mg Oral QHS Isaiah Serge, NP   0.8 mg at 06/22/14 2200   Filed Weights   06/22/14 1900 06/23/14 0517  Weight: 196 lb 1.6 oz (88.95 kg) 193 lb 10.8 oz (87.851 kg)   PE: General appearance: alert, cooperative and no distress Neck: no carotid bruit and no JVD Lungs: decreased BS at the bases. Bilateral basilar rales L>R Heart: regular rate and rhythm, S1, S2 normal, no murmur, click, rub or gallop Extremities: trace- 1+ LEE Pulses: 2+ and symmetric Skin: warm and dry Neurologic: Grossly normal  Lab  Results:   Recent Labs  06/22/14 1140  WBC 5.2  HGB 10.2*  HCT 31.7*  PLT 180   BMET  Recent Labs  06/22/14 1140 06/23/14 0535  NA 138 140  K 3.9 3.4*  CL 100 100  CO2 28 32  GLUCOSE 237* 188*  BUN 34* 31*  CREATININE 2.53* 2.35*  CALCIUM 9.0 8.7   PT/INR  Recent Labs  06/22/14 1140  LABPROT 16.6*  INR 1.33   Cardiac Panel (last 3 results)  Recent Labs  06/22/14 1140 06/22/14 1945 06/23/14 0535  TROPONINI 0.14* 0.15* 0.14*   BNP (last 3 results)  Recent Labs  05/27/14 0512 06/22/14 1140  BNP 465.0* 447.7*    ProBNP (last 3 results)  Recent Labs  12/22/13 1504 12/23/13 0535 04/04/14 0742  PROBNP 3984.0* 4564.0* 4874.0*     Assessment/Plan  Principal Problem:   Acute on chronic diastolic CHF (congestive heart failure), NYHA class 3 Active Problems:   Type 2 diabetes mellitus with peripheral neuropathy   Paroxysmal atrial fibrillation, DCCV 06/16/13   Chronic anticoagulation   Pacemaker - St Jude   CKD (chronic kidney disease) stage 4, GFR 15-29 ml/min   Chronic diastolic heart failure   Elevated troponin I level    1. Acute on Chronic Diastolic CHF/Pulmonary Edema: good diuresis since admit. 2.3L out yesterday. Net negative 1.9 L. Weight is down 3 lbs since yesterday at 193. Last discharge weight for HF on 05/29/14, per d/c summary, was 189 lb. Will attempt to diuresis back down to this dry weight. He continues to have basilar rales L>R and trace LEE. Continue IV Lasix, strict I/Os, daily weights and low sodium diet.   2. Urinary Retention: Bladder scan on admit revealed 450 cc urine. Foley catheter now in place. Continue Flomax.   3. CKD: Scr improved some since admit, from 2.53 -->2.35. On 80 mg IV Lasix BID for CHF. Will monitor closely with daily BMET.   4. PAF: SR. HR well controlled. Continue amiodarone + metoprolol for rhythm/rate control. On low dose Eliquis for stroke prophylaxis.  CHA2DS2VASc is 7   5. H/o SSS: Has functioning  PPM. Paced rhythm on telemetry. Rate in the 60s.   6. HTN: stable and well controlled at 135/71. Will need to monitor closely in the setting of IV lasix therapy with multiple other  antihypertensives including Imdur, metoprolol and Flomax  7. Hypokalemia:  Repleat with supplemental K-dur, 40 mEq x 1. F/u BMET in the am.  8. Elevated Troponin: minimally elevated and flat trend. Denies CP. Likely subsequent to demand ischemia in the setting of a/c CHF exacerbation. No further w/u indicated.      LOS: 1 day    Brittainy M. Ladoris Gene 06/23/2014 8:29 AM  Patient seen and examined and history reviewed. Agree with above findings and plan. Feels better today. Less SOB. Good initial response to diuretics. I/O negative 1.9 liters and weight down 3 lbs. Still has rales on exam and 1+ edema. Creatinine improved. Needs potassium repletion. Continue IV diuresis for now. Telemetry shows NSR with long first degree AV block. Will leave foley in place while giving IV lasix.   Eissa Buchberger [place], [place] 06/23/2014 9:52 AM

## 2014-06-24 LAB — GLUCOSE, CAPILLARY
GLUCOSE-CAPILLARY: 167 mg/dL — AB (ref 70–99)
GLUCOSE-CAPILLARY: 226 mg/dL — AB (ref 70–99)
GLUCOSE-CAPILLARY: 209 mg/dL — AB (ref 70–99)
Glucose-Capillary: 226 mg/dL — ABNORMAL HIGH (ref 70–99)

## 2014-06-24 LAB — BASIC METABOLIC PANEL
ANION GAP: 7 (ref 5–15)
BUN: 30 mg/dL — ABNORMAL HIGH (ref 6–23)
CALCIUM: 8.9 mg/dL (ref 8.4–10.5)
CO2: 33 mmol/L — ABNORMAL HIGH (ref 19–32)
CREATININE: 2.28 mg/dL — AB (ref 0.50–1.35)
Chloride: 98 mmol/L (ref 96–112)
GFR calc Af Amer: 27 mL/min — ABNORMAL LOW (ref >90)
GFR calc non Af Amer: 23 mL/min — ABNORMAL LOW (ref >90)
Glucose, Bld: 173 mg/dL — ABNORMAL HIGH (ref 70–99)
Potassium: 3.4 mmol/L — ABNORMAL LOW (ref 3.5–5.1)
Sodium: 138 mmol/L (ref 135–145)

## 2014-06-24 MED ORDER — POTASSIUM CHLORIDE CRYS ER 20 MEQ PO TBCR
40.0000 meq | EXTENDED_RELEASE_TABLET | Freq: Every day | ORAL | Status: DC
  Administered 2014-06-25 – 2014-06-26 (×2): 40 meq via ORAL
  Filled 2014-06-24 (×3): qty 2

## 2014-06-24 MED ORDER — TORSEMIDE 20 MG PO TABS
80.0000 mg | ORAL_TABLET | Freq: Two times a day (BID) | ORAL | Status: DC
  Administered 2014-06-24 (×2): 80 mg via ORAL
  Filled 2014-06-24 (×4): qty 4

## 2014-06-24 MED ORDER — SALINE SPRAY 0.65 % NA SOLN
1.0000 | NASAL | Status: DC | PRN
  Administered 2014-06-24: 1 via NASAL
  Filled 2014-06-24: qty 44

## 2014-06-24 MED ORDER — SIMETHICONE 80 MG PO CHEW
80.0000 mg | CHEWABLE_TABLET | Freq: Once | ORAL | Status: DC
Start: 2014-06-24 — End: 2014-07-03
  Filled 2014-06-24: qty 1

## 2014-06-24 NOTE — Progress Notes (Signed)
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    Subjective: Feeling much better. Breathing improved. No CP. Wants to work with PT  Objective: Vital signs in last 24 hours: Temp:  [97.6 F (36.4 C)-98.1 F (36.7 C)] 97.7 F (36.5 C) (02/11 0420) Pulse Rate:  [60-68] 64 (02/11 0420) Resp:  [18] 18 (02/11 0420) BP: (120-139)/(49-63) 139/62 mmHg (02/11 0420) SpO2:  [96 %-100 %] 96 % (02/11 0420) Weight:  [187 lb 8 oz (85.049 kg)] 187 lb 8 oz (85.049 kg) (02/11 0420) Last BM Date: 06/23/14  Intake/Output from previous day: 02/10 0701 - 02/11 0700 In: 920 [P.O.:920] Out: 2050 [Urine:2050] Intake/Output this shift:    Medications Current Facility-Administered Medications  Medication Dose Route Frequency Provider Last Rate Last Dose  . 0.9 %  sodium chloride infusion  250 mL Intravenous PRN Isaiah Serge, NP      . acetaminophen (TYLENOL) tablet 650 mg  650 mg Oral Q4H PRN Isaiah Serge, NP      . allopurinol (ZYLOPRIM) tablet 100 mg  100 mg Oral BID Isaiah Serge, NP   100 mg at 06/23/14 2112  . amiodarone (PACERONE) tablet 200 mg  200 mg Oral QHS Isaiah Serge, NP   200 mg at 06/23/14 2112  . apixaban (ELIQUIS) tablet 2.5 mg  2.5 mg Oral BID Isaiah Serge, NP   2.5 mg at 06/23/14 2112  . dutasteride (AVODART) capsule 0.5 mg  0.5 mg Oral Daily Isaiah Serge, NP   0.5 mg at 06/23/14 5035  . ferrous sulfate tablet 325 mg  325 mg Oral QODAY Isaiah Serge, NP   325 mg at 06/23/14 4656  . furosemide (LASIX) injection 80 mg  80 mg Intravenous Q12H Isaiah Serge, NP   80 mg at 06/23/14 2113  . insulin aspart (novoLOG) injection 0-5 Units  0-5 Units Subcutaneous QHS Isaiah Serge, NP   0 Units at 06/22/14 2200  . insulin aspart (novoLOG) injection 0-9 Units  0-9 Units Subcutaneous TID WC Isaiah Serge, NP    2 Units at 06/24/14 [phone]  . insulin glargine (LANTUS) injection 6 Units  6 Units Subcutaneous QHS Isaiah Serge, NP   6 Units at 06/23/14 2112  . isosorbide mononitrate (IMDUR) 24 hr tablet 60 mg  60 mg Oral Daily Isaiah Serge, NP   60 mg at 06/23/14 5170  . latanoprost (XALATAN) 0.005 % ophthalmic solution 1 drop  1 drop Both Eyes QHS Isaiah Serge, NP   1 drop at 06/22/14 2200  . metoprolol tartrate (LOPRESSOR) tablet 25 mg  25 mg Oral Daily Isaiah Serge, NP   25 mg at 06/23/14 0174  . multivitamin with minerals tablet 1 tablet  1 tablet Oral Daily Isaiah Serge, NP   1 tablet at 06/23/14 9449  . nitroGLYCERIN (NITROSTAT) SL tablet 0.4 mg  0.4 mg Sublingual Q5 min PRN Isaiah Serge, NP      . ondansetron [facility]) injection 4 mg  4 mg Intravenous Q6H PRN Isaiah Serge, NP      . polyethylene glycol (MIRALAX / GLYCOLAX) packet 17 g  17 g Oral BID PRN Isaiah Serge, NP   17 g at 06/23/14 2146  . polyvinyl alcohol (LIQUIFILM TEARS) 1.4 % ophthalmic solution 1 drop  1 drop Both Eyes BID Isaiah Serge, NP   1 drop at 06/22/14 2200  . potassium chloride SA (K-DUR,KLOR-CON) CR tablet 20 mEq  20 mEq Oral Daily Isaiah Serge, NP   20 mEq at 06/23/14 2706  . sodium chloride 0.9 % injection 3 mL  3 mL Intravenous Q12H Isaiah Serge, NP   3 mL at 06/23/14 2119  . sodium chloride 0.9 % injection 3 mL  3 mL Intravenous PRN Isaiah Serge, NP      . tamsulosin [facility]) capsule 0.8 mg  0.8 mg Oral QHS Isaiah Serge, NP   0.8 mg at 06/23/14 2112   Filed Weights   06/22/14 1900 06/23/14 0517 06/24/14 0420  Weight: 196 lb 1.6 oz (88.95 kg) 193 lb 10.8 oz (87.851 kg) 187 lb 8 oz (85.049 kg)   PE: General appearance: alert, cooperative and no distress Neck: no carotid bruit and no JVD Lungs: decreased BS at the bases. Bilateral basilar rales L>R Heart: regular rate and rhythm, S1, S2 normal, no murmur, click, rub or gallop Extremities: trace- 1+ LEE Pulses: 2+ and symmetric Skin: warm and  dry Neurologic: Grossly normal  Lab Results:   Recent Labs  06/22/14 1140  WBC 5.2  HGB 10.2*  HCT 31.7*  PLT 180   BMET  Recent Labs  06/22/14 1140 06/23/14 0535 06/24/14 0500  NA 138 140 138  K 3.9 3.4* 3.4*  CL 100 100 98  CO2 28 32 33*  GLUCOSE 237* 188* 173*  BUN 34* 31* 30*  CREATININE 2.53* 2.35* 2.28*  CALCIUM 9.0 8.7 8.9   PT/INR  Recent Labs  06/22/14 1140  LABPROT 16.6*  INR 1.33   Cardiac Panel (last 3 results)  Recent Labs  06/22/14 1140 06/22/14 1945 06/23/14 0535  TROPONINI 0.14* 0.15* 0.14*   BNP (last 3 results)  Recent Labs  05/27/14 0512 06/22/14 1140  BNP 465.0* 447.7*    ProBNP (last 3 results)  Recent Labs  12/22/13 1504 12/23/13 0535 04/04/14 0742  PROBNP 3984.0* 4564.0* 4874.0*     Assessment/Plan  Principal Problem:   Acute on chronic diastolic CHF (congestive heart failure), NYHA class 3 Active Problems:   Type 2 diabetes mellitus with peripheral neuropathy   Paroxysmal atrial fibrillation, DCCV 06/16/13   Chronic anticoagulation   Pacemaker - St Jude   CKD (chronic kidney disease) stage 4, GFR 15-29 ml/min   Chronic diastolic heart failure   Elevated troponin I level   1. Acute on Chronic Diastolic CHF/Pulmonary Edema: good diuresis since admit. Net negative 3.1L. Weight is down 9 lbs since admisson (196-->187lbs). Last discharge weight for HF on 05/29/14, per d/c summary, was 189 lb.  -- Will convert him to home dose of torsemide 80mg  BID -- Continue strict I/Os, daily weights and low sodium diet.   2. Urinary Retention: Bladder scan on admit revealed 450 cc urine. Foley catheter now in place. Continue Flomax.   3. CKD: Scr improved some since admit, from 2.53 -->2.35--> 2.28. Will convert IV to PO diuretics today.   4. PAF: SR. HR well controlled. Continue amiodarone + metoprolol for rhythm/rate control. On low dose Eliquis for stroke prophylaxis.  CHA2DS2VASc is 7   5. H/o SSS: Has functioning PPM.  Paced rhythm on telemetry. Rate in the 60s.   6. HTN: stable and well controlled at 135/71. Will need to monitor closely in the setting of IV lasix therapy with multiple other antihypertensives including Imdur, metoprolol and Flomax  7.  Hypokalemia:  Repleat with supplemental K-dur, 40 mEq x 1 again today.  8. Elevated Troponin: minimally elevated and flat trend. Denies CP. Likely subsequent to demand ischemia in the setting of a/c CHF exacerbation. No further w/u indicated.   Dispo- will convert him to PO today and have PT see him. Likely home tomorrow assuming he does well on his home dose of diuretics.     LOS: 2 days    K. Billee Cashing 06/24/2014 7:45 AM   Patient seen and examined and history reviewed. Agree with above findings and plan. He is clearly improving. Weight down to 187. I/O negative 2.8 liters since admit. He still has some rales on exam but LE edema resolved. Creatinine improved to 2.28. Will switch to po torsemide today. Ask PT to see ( patient feels unsteady on feet). Anticipate DC tomorrow.   Peter [place], [place] 06/24/2014 11:17 AM

## 2014-06-24 NOTE — Progress Notes (Signed)
Pt c/o nasal congestion, states he cannot breathe well through nose and requesting nasal spray. MD on call notified and new order placed. Will continue to monitor. Ronnette Hila, RN

## 2014-06-24 NOTE — Progress Notes (Signed)
Pt alert and oriented x4, pt sitting on side of bed , denies any needs at this time. Report received from staff RN Lovena Le.

## 2014-06-24 NOTE — Progress Notes (Signed)
Pt caregiver brought hearing aids for pt.  [name]

## 2014-06-25 DIAGNOSIS — Z950 Presence of cardiac pacemaker: Secondary | ICD-10-CM

## 2014-06-25 LAB — BASIC METABOLIC PANEL
ANION GAP: 9 (ref 5–15)
BUN: 32 mg/dL — ABNORMAL HIGH (ref 6–23)
CHLORIDE: 99 mmol/L (ref 96–112)
CO2: 31 mmol/L (ref 19–32)
Calcium: 9.2 mg/dL (ref 8.4–10.5)
Creatinine, Ser: 2.82 mg/dL — ABNORMAL HIGH (ref 0.50–1.35)
GFR, EST AFRICAN AMERICAN: 21 mL/min — AB (ref >90)
GFR, EST NON AFRICAN AMERICAN: 18 mL/min — AB (ref >90)
Glucose, Bld: 173 mg/dL — ABNORMAL HIGH (ref 70–99)
POTASSIUM: 4.6 mmol/L (ref 3.5–5.1)
SODIUM: 139 mmol/L (ref 135–145)

## 2014-06-25 LAB — GLUCOSE, CAPILLARY
GLUCOSE-CAPILLARY: 182 mg/dL — AB (ref 70–99)
Glucose-Capillary: 165 mg/dL — ABNORMAL HIGH (ref 70–99)
Glucose-Capillary: 215 mg/dL — ABNORMAL HIGH (ref 70–99)
Glucose-Capillary: 206 mg/dL — ABNORMAL HIGH (ref 70–99)

## 2014-06-25 MED ORDER — FUROSEMIDE 10 MG/ML IJ SOLN
80.0000 mg | Freq: Two times a day (BID) | INTRAMUSCULAR | Status: DC
  Administered 2014-06-25 – 2014-06-27 (×6): 80 mg via INTRAVENOUS
  Filled 2014-06-25 (×9): qty 8

## 2014-06-25 NOTE — Evaluation (Signed)
Physical Therapy Evaluation Patient Details Name: [name] MRN: [id number] DOB: April 07, 1919 Today's Date: 06/25/2014   History of Present Illness  Patient is a 81 y.o male admitted with h/o diastolic CHF, PAF on Eliquis, SSS s/p PPM, HTN, HLD, stage 4 CKD and h/o urinary retention, who was admitted 06/22/14 for pulmonary edema 2/2 acute on chronic diastolic CHF.   Clinical Impression  Patient presents with decreased independence with mobility due to deficits listed in PT problem list.  He will benefit from skilled PT in the acute setting to allow return home with family/caregiver assist and HHPT.    Follow Up Recommendations Home health PT;Supervision/Assistance - 24 hour    Equipment Recommendations  None recommended by PT    Recommendations for Other Services       Precautions / Restrictions Precautions Precautions: Fall Precaution Comments: knees buckled while walking and when fatigued      Mobility  Bed Mobility Overal bed mobility: Needs Assistance Bed Mobility: Supine to Sit     Supine to sit: Min assist     General bed mobility comments: to lift trunk to sit upright  Transfers Overall transfer level: Needs assistance Equipment used: Rolling walker (2 wheeled) Transfers: Sit to/from Stand Sit to Stand: Min assist         General transfer comment: for steadying when rising  Ambulation/Gait Ambulation/Gait assistance: Min guard;Mod assist Ambulation Distance (Feet): 140 Feet Assistive device: Rolling walker (2 wheeled) Gait Pattern/deviations: Step-through pattern;Decreased stride length;Scissoring;Shuffle;Trunk flexed     General Gait Details: left foot comes over and hits right during swing, needs increased assist due to fatigue and knees buckling to prevent loss of balance  Stairs            Wheelchair Mobility    Modified Rankin (Stroke Patients Only)       Balance Overall balance assessment: Needs assistance         Standing  balance support: Bilateral upper extremity supported Standing balance-Leahy Scale: Poor Standing balance comment: needs UE support to balance                             Pertinent Vitals/Pain Pain Assessment: Faces Faces Pain Scale: Hurts little more Pain Location: headache Pain Intervention(s): Monitored during session    Home Living Family/patient expects to be discharged to:: Private residence Living Arrangements: Children Available Help at Discharge: Personal care attendant;Available 24 hours/day;Family Type of Home: House Home Access: Stairs to enter Entrance Stairs-Rails: Right;Left;Can reach both Entrance Stairs-Number of Steps: 3 Home Layout: One level Home Equipment: Walker - 4 wheels;Shower seat      Prior Function Level of Independence: Needs assistance   Gait / Transfers Assistance Needed: Idependent with the rollator  ADL's / Homemaking Assistance Needed: Stepping in and out of shower needs assist, washing back needs assist        Hand Dominance   Dominant Hand: Right    Extremity/Trunk Assessment   Upper Extremity Assessment: Generalized weakness           Lower Extremity Assessment: Generalized weakness      Cervical / Trunk Assessment: Kyphotic  Communication   Communication: HOH  Cognition Arousal/Alertness: Awake/alert Behavior During Therapy: WFL for tasks assessed/performed Overall Cognitive Status: Within Functional Limits for tasks assessed                      General Comments      Exercises  Assessment/Plan    PT Assessment Patient needs continued PT services  PT Diagnosis Abnormality of gait;Generalized weakness   PT Problem List Decreased strength;Decreased knowledge of use of DME;Decreased activity tolerance;Decreased balance;Decreased mobility  PT Treatment Interventions DME instruction;Balance training;Gait training;Stair training;Functional mobility training;Patient/family  education;Therapeutic activities;Therapeutic exercise   PT Goals (Current goals can be found in the Care Plan section) Acute Rehab PT Goals Patient Stated Goal: To go home PT Goal Formulation: With patient/family Time For Goal Achievement: 07/02/14 Potential to Achieve Goals: Good    Frequency Min 3X/week   Barriers to discharge        Co-evaluation               End of Session Equipment Utilized During Treatment: Gait belt Activity Tolerance: Patient limited by fatigue Patient left: in bed;with call bell/phone within reach;with family/visitor present           Time: 0841-0905 PT Time Calculation (min) (ACUTE ONLY): 24 min   Charges:   PT Evaluation $Initial PT Evaluation Tier I: 1 Procedure PT Treatments $Gait Training: 8-22 mins   PT G CodesMagda Kiel July 21, 2014, 2:20 PM  [place], [place] Jul 21, 2014

## 2014-06-25 NOTE — Care Management Note (Signed)
    Page 1 of 2   07/03/2014     1:01:44 PM CARE MANAGEMENT NOTE 07/03/2014  Patient:  [name], [name]   Account Number:  [id number]  Date Initiated:  06/25/2014  Documentation initiated by:  AMERSON,JULIE  Subjective/Objective Assessment:   Pt adm on 06/22/14 with CHF exacerbation.  PTA, pt resides at home with 24hr caregiver.     Action/Plan:   PT recommending HHPT, which pt usually refuses.  Will attempt to arrange [facility] per recommendations.   Anticipated DC Date:  07/02/2014   Anticipated DC Plan:  [place]  CM consult      Choice offered to / List presented to:     DME arranged  Vassie Moselle      DME agency  [facility]   Status of service:  In process, will continue to follow Medicare Important Message given?  YES (If response is "NO", the following Medicare IM given date fields will be blank) Date Medicare IM given:  06/25/2014 Medicare IM given by:  AMERSON,JULIE Date Additional Medicare IM given:  07/03/2014 Additional Medicare IM given by:  [facility] [name]  Discharge Disposition:    Per UR Regulation:  Reviewed for med. necessity/level of care/duration of stay  If discussed at [place] of Stay Meetings, dates discussed:   06/29/2014  07/01/2014    Comments:  07/05/18/16 10:30 CM met with pt and caretaker in room and pt concerned wheelchair and cusion won't fit in car so requested [facility] ship wheelchair and cusion to home address. CM verified address and contact information with caretaker and called [name] of [facility] to please arrange. [name] states he will arrange for shipment.  No other CM needs were communicated.  Mariane Masters, BSN, [place].   07/01/14 Ellan Lambert, RN, BSN [phone] Met with pt and son to discuss poss HH follow up.  Pt reluctant to agree to [facility] follow up at dc, as he still goes to his office 4 days /week.  He is, however agreeable to [facility] follow up at  this time.  Notified Natividad Brood with [facility], who will follow up with pt.

## 2014-06-25 NOTE — Progress Notes (Signed)
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    Subjective: Breathing better.  No CP. Wants to work with PT still as they did not come see him yesterday. Agreeable to staying longer for IV diuresis  Objective: Vital signs in last 24 hours: Temp:  [97.6 F (36.4 C)-98.5 F (36.9 C)] 98.5 F (36.9 C) (02/12 0502) Pulse Rate:  [59-64] 64 (02/12 0502) Resp:  [18] 18 (02/12 0502) BP: (103-137)/(33-57) 126/51 mmHg (02/12 0502) SpO2:  [94 %-97 %] 97 % (02/12 0502) Weight:  [191 lb 12.8 oz (87 kg)] 191 lb 12.8 oz (87 kg) (02/12 0502) Last BM Date: 06/24/14  Intake/Output from previous day: 02/11 0701 - 02/12 0700 In: 900 [P.O.:900] Out: 800 [Urine:800] Intake/Output this shift:    Medications Current Facility-Administered Medications  Medication Dose Route Frequency Provider Last Rate Last Dose  . 0.9 %  sodium chloride infusion  250 mL Intravenous PRN Isaiah Serge, NP      . acetaminophen (TYLENOL) tablet 650 mg  650 mg Oral Q4H PRN Isaiah Serge, NP      . allopurinol (ZYLOPRIM) tablet 100 mg  100 mg Oral BID Isaiah Serge, NP   100 mg at 06/24/14 2103  . amiodarone (PACERONE) tablet 200 mg  200 mg Oral QHS Isaiah Serge, NP   200 mg at 06/24/14 2103  . apixaban (ELIQUIS) tablet 2.5 mg  2.5 mg Oral BID Isaiah Serge, NP   2.5 mg at 06/24/14 2103  . dutasteride (AVODART) capsule 0.5 mg  0.5 mg Oral Daily Isaiah Serge, NP   0.5 mg at 06/24/14 0947  . ferrous sulfate tablet 325 mg  325 mg Oral QODAY Isaiah Serge, NP   325 mg at 06/23/14 4034  . insulin aspart (novoLOG) injection 0-5 Units  0-5 Units Subcutaneous QHS Isaiah Serge, NP   2 Units at 06/24/14 2105  . insulin aspart (novoLOG) injection 0-9 Units  0-9 Units Subcutaneous TID WC Isaiah Serge, NP   2 Units at 06/25/14 [phone]  . insulin  glargine (LANTUS) injection 6 Units  6 Units Subcutaneous QHS Isaiah Serge, NP   6 Units at 06/24/14 2105  . isosorbide mononitrate (IMDUR) 24 hr tablet 60 mg  60 mg Oral Daily Isaiah Serge, NP   60 mg at 06/24/14 0946  . latanoprost (XALATAN) 0.005 % ophthalmic solution 1 drop  1 drop Both Eyes QHS Isaiah Serge, NP   1 drop at 06/22/14 2200  . metoprolol tartrate (LOPRESSOR) tablet 25 mg  25 mg Oral Daily Isaiah Serge, NP   25 mg at 06/24/14 9563  . multivitamin with minerals tablet 1 tablet  1 tablet Oral Daily Isaiah Serge, NP   1 tablet at 06/24/14 0946  . nitroGLYCERIN (NITROSTAT) SL tablet 0.4 mg  0.4 mg Sublingual Q5 min PRN Isaiah Serge, NP      . ondansetron [facility]) injection 4 mg  4 mg Intravenous Q6H PRN Isaiah Serge, NP   4 mg at 06/24/14 1234  . polyethylene glycol (MIRALAX / GLYCOLAX) packet 17 g  17 g Oral BID PRN Isaiah Serge, NP   17 g at 06/23/14 2146  . polyvinyl alcohol (LIQUIFILM TEARS) 1.4 % ophthalmic solution 1 drop  1 drop Both Eyes BID Mickel Baas  Rozell Searing, NP   1 drop at 06/24/14 1000  . potassium chloride SA (K-DUR,KLOR-CON) CR tablet 40 mEq  40 mEq Oral Daily Peter M Martinique, MD      . simethicone [facility]) chewable tablet 80 mg  80 mg Oral Once Eileen Stanford, PA-C   80 mg at 06/24/14 1200  . sodium chloride (OCEAN) 0.65 % nasal spray 1 spray  1 spray Each Nare PRN Elsie Amis, MD   1 spray at 06/24/14 2227  . sodium chloride 0.9 % injection 3 mL  3 mL Intravenous Q12H Isaiah Serge, NP   3 mL at 06/24/14 2107  . sodium chloride 0.9 % injection 3 mL  3 mL Intravenous PRN Isaiah Serge, NP      . tamsulosin [facility]) capsule 0.8 mg  0.8 mg Oral QHS Isaiah Serge, NP   0.8 mg at 06/24/14 2103  . torsemide (DEMADEX) tablet 80 mg  80 mg Oral BID Eileen Stanford, PA-C   80 mg at 06/24/14 2103   Filed Weights   06/23/14 0517 06/24/14 0420 06/25/14 0502  Weight: 193 lb 10.8 oz (87.851 kg) 187 lb 8 oz (85.049 kg) 191 lb 12.8 oz (87 kg)   PE: General  appearance: alert, cooperative and no distress Neck: no carotid bruit and no JVD Lungs: decreased BS at the bases. Bilateral basilar rales L>R Heart: regular rate and rhythm, S1, S2 normal, no murmur, click, rub or gallop Extremities: trace- 1+ LEE Pulses: 2+ and symmetric Skin: warm and dry Neurologic: Grossly normal  Lab Results:   Recent Labs  06/22/14 1140  WBC 5.2  HGB 10.2*  HCT 31.7*  PLT 180   BMET  Recent Labs  06/23/14 0535 06/24/14 0500 06/25/14 0446  NA 140 138 139  K 3.4* 3.4* 4.6  CL 100 98 99  CO2 32 33* 31  GLUCOSE 188* 173* 173*  BUN 31* 30* 32*  CREATININE 2.35* 2.28* 2.82*  CALCIUM 8.7 8.9 9.2   PT/INR  Recent Labs  06/22/14 1140  LABPROT 16.6*  INR 1.33   Cardiac Panel (last 3 results)  Recent Labs  06/22/14 1140 06/22/14 1945 06/23/14 0535  TROPONINI 0.14* 0.15* 0.14*   BNP (last 3 results)  Recent Labs  05/27/14 0512 06/22/14 1140  BNP 465.0* 447.7*    ProBNP (last 3 results)  Recent Labs  12/22/13 1504 12/23/13 0535 04/04/14 0742  PROBNP 3984.0* 4564.0* 4874.0*     Assessment/Plan  Principal Problem:   Acute on chronic diastolic CHF (congestive heart failure), NYHA class 3 Active Problems:   Type 2 diabetes mellitus with peripheral neuropathy   Paroxysmal atrial fibrillation, DCCV 06/16/13   Chronic anticoagulation   Pacemaker - St Jude   CKD (chronic kidney disease) stage 4, GFR 15-29 ml/min   Chronic diastolic heart failure   Elevated troponin I level   1. Acute on Chronic Diastolic CHF/Pulmonary Edema: good diuresis on IV Lasix. Weight down 196--> 187 lbs. We then switched him to home dose of torsemide yesterday and his weight increased to 191lbs. Net negative 3.0L.  Last discharge weight for HF on 05/29/14, per d/c summary, was 189 lb. I think our goal weight should at least be 189lbs or under. Still a little wet on exam.  -- Will place him back on IV lasix 80mg  BID.  -- Continue strict I/Os, daily weights  and low sodium diet.   2. Urinary Retention: Bladder scan on admit revealed 450 cc urine. Foley catheter now  in place. Continue Flomax.   3. CKD: Scr improved some since admit, from 2.53 -->2.35--> 2.28. Now increased to 2.82 after swtiched to PO torsemide yesterday? Will place back on IV lasix. Foley still in place   4. PAF: SR. HR well controlled. Continue amiodarone + metoprolol for rhythm/rate control. On low dose Eliquis for stroke prophylaxis.  CHA2DS2VASc is 7   5. H/o SSS: Has functioning PPM. Paced rhythm on telemetry. Rate in the 60s.   6. HTN: stable and well controlled at 126/51. Will need to monitor closely in the setting of IV lasix therapy with multiple other antihypertensives including Imdur, metoprolol and Flomax   7. Hypokalemia: Repleted  8. Elevated Troponin: minimally elevated and flat trend. Denies CP. Likely subsequent to demand ischemia in the setting of a/c CHF exacerbation. No further w/u indicated.   9. Possible short run of NSVT on tele- hard to tell. Strips in chart. Asymptomatic. Continue BB  Deconditioning- I placed an order for PT eval and treat but they did not see. RN will make sure he is seen today.      LOS: 3 days    K. Billee Cashing 06/25/2014 7:42 AM   Patient seen and examined and history reviewed. Agree with above findings and plan. He did not do well on oral torsemide yesterday. Weight up 4 lbs. Did not diurese on oral meds. Creatinine increased to 2.83. Still has decreased BS and rales on exam. Fairly weak on exertion. When walking to Nursing station with PT knees wanted to give way. We will put back on IV lasix. Really try to get down to lower dry weight ?185 lbs. Watch renal function closely. He will benefit from more PT.  Peter Martinique, [place] 06/25/2014 9:04 AM

## 2014-06-26 LAB — CBC
HEMATOCRIT: 33 % — AB (ref 39.0–52.0)
Hemoglobin: 10.4 g/dL — ABNORMAL LOW (ref 13.0–17.0)
MCH: 32.1 pg (ref 26.0–34.0)
MCHC: 31.5 g/dL (ref 30.0–36.0)
MCV: 101.9 fL — ABNORMAL HIGH (ref 78.0–100.0)
Platelets: 186 10*3/uL (ref 150–400)
RBC: 3.24 MIL/uL — ABNORMAL LOW (ref 4.22–5.81)
RDW: 15.8 % — AB (ref 11.5–15.5)
WBC: 5.5 10*3/uL (ref 4.0–10.5)

## 2014-06-26 LAB — BASIC METABOLIC PANEL
ANION GAP: 6 (ref 5–15)
BUN: 34 mg/dL — ABNORMAL HIGH (ref 6–23)
CO2: 37 mmol/L — AB (ref 19–32)
CREATININE: 2.67 mg/dL — AB (ref 0.50–1.35)
Calcium: 9 mg/dL (ref 8.4–10.5)
Chloride: 93 mmol/L — ABNORMAL LOW (ref 96–112)
GFR calc non Af Amer: 19 mL/min — ABNORMAL LOW (ref >90)
GFR, EST AFRICAN AMERICAN: 22 mL/min — AB (ref >90)
Glucose, Bld: 261 mg/dL — ABNORMAL HIGH (ref 70–99)
POTASSIUM: 4.3 mmol/L (ref 3.5–5.1)
Sodium: 136 mmol/L (ref 135–145)

## 2014-06-26 LAB — GLUCOSE, CAPILLARY
Glucose-Capillary: 170 mg/dL — ABNORMAL HIGH (ref 70–99)
Glucose-Capillary: 202 mg/dL — ABNORMAL HIGH (ref 70–99)
Glucose-Capillary: 179 mg/dL — ABNORMAL HIGH (ref 70–99)

## 2014-06-26 MED ORDER — PANTOPRAZOLE SODIUM 40 MG PO TBEC
40.0000 mg | DELAYED_RELEASE_TABLET | Freq: Every day | ORAL | Status: DC
  Administered 2014-06-26 – 2014-07-03 (×8): 40 mg via ORAL
  Filled 2014-06-26 (×8): qty 1

## 2014-06-26 MED ORDER — FAMOTIDINE 20 MG PO TABS
20.0000 mg | ORAL_TABLET | Freq: Once | ORAL | Status: AC | PRN
  Filled 2014-06-26: qty 1

## 2014-06-26 NOTE — Progress Notes (Signed)
Subjective:  Sitting up at side of bed with less shortness of breath.  Diuresing some and weight is down 1 pound today.  Objective:  Vital Signs in the last 24 hours: BP 133/60 mmHg  Pulse 64  Temp(Src) 97.8 F (36.6 C) (Oral)  Resp 18  Ht 5\' 7"  (1.702 m)  Wt 86.4 kg (190 lb 7.6 oz)  BMI 29.83 kg/m2  SpO2 100%  Physical Exam: Pleasant black male in no acute distress, appearing younger than stated age  Lungs:  Mild rales, reduced breath sounds Cardiac:  Regular rhythm, normal S1 and S2, no S3 Extremities:  1+ edema present  Intake/Output from previous day: 02/12 0701 - 02/13 0700 In: 1083 [P.O.:1080; I.V.:3] Out: 1376 [Urine:1375; Stool:1] Weight Filed Weights   06/24/14 0420 06/25/14 0502 06/26/14 0459  Weight: 85.049 kg (187 lb 8 oz) 87 kg (191 lb 12.8 oz) 86.4 kg (190 lb 7.6 oz)    Lab Results: Basic Metabolic Panel:  Recent Labs  06/24/14 0500 06/25/14 0446  NA 138 139  K 3.4* 4.6  CL 98 99  CO2 33* 31  GLUCOSE 173* 173*  BUN 30* 32*  CREATININE 2.28* 2.82*   BNP    Component Value Date/Time   BNP 447.7* 06/22/2014 1140   Telemetry: Atrial paced rhythm this morning, rate is in the low 60s.  Assessment/Plan:  1.  Acute on chronic diastolic heart failure 2.  Stage IV chronic kidney disease-no renal function checked today 3.  History of paroxysmal atrial fibrillation currently in sinus rhythm 4.  Urinary retention with Foley in place  Recommendations:  Continue intravenous diuresis over the weekend with a goal weight of 185.  Check electrolytes and renal function.       Kerry Hough  MD [facility] Card[facility]  06/26/2014, 9:18 AM

## 2014-06-27 LAB — BASIC METABOLIC PANEL
Anion gap: 8 (ref 5–15)
BUN: 37 mg/dL — ABNORMAL HIGH (ref 6–23)
CO2: 30 mmol/L (ref 19–32)
Calcium: 9.2 mg/dL (ref 8.4–10.5)
Chloride: 101 mmol/L (ref 96–112)
Creatinine, Ser: 2.53 mg/dL — ABNORMAL HIGH (ref 0.50–1.35)
GFR calc Af Amer: 24 mL/min — ABNORMAL LOW (ref >90)
GFR, EST NON AFRICAN AMERICAN: 20 mL/min — AB (ref >90)
GLUCOSE: 141 mg/dL — AB (ref 70–99)
Potassium: 5.5 mmol/L — ABNORMAL HIGH (ref 3.5–5.1)
Sodium: 139 mmol/L (ref 135–145)

## 2014-06-27 LAB — GLUCOSE, CAPILLARY
GLUCOSE-CAPILLARY: 185 mg/dL — AB (ref 70–99)
Glucose-Capillary: 222 mg/dL — ABNORMAL HIGH (ref 70–99)
Glucose-Capillary: 146 mg/dL — ABNORMAL HIGH (ref 70–99)
Glucose-Capillary: 278 mg/dL — ABNORMAL HIGH (ref 70–99)
Glucose-Capillary: 219 mg/dL — ABNORMAL HIGH (ref 70–99)

## 2014-06-27 NOTE — Progress Notes (Signed)
Subjective:  Lying in the bed, not currently short of breath.  Weight down further.  Renal function is holding its own.  Objective:  Vital Signs in the last 24 hours: BP 122/56 mmHg  Pulse 62  Temp(Src) 98.1 F (36.7 C) (Oral)  Resp 18  Ht 5\' 7"  (1.702 m)  Wt 86.138 kg (189 lb 14.4 oz)  BMI 29.74 kg/m2  SpO2 100%  Physical Exam: Pleasant black male in no acute distress, appearing younger than stated age  Lungs:  Mild rales, reduced breath sounds Cardiac:  Regular rhythm, normal S1 and S2, no S3 Extremities:  1+ edema present  Intake/Output from previous day: 02/13 0701 - 02/14 0700 In: 1540 [P.O.:1540] Out: 1102 [Urine:1100; Stool:2] Weight Filed Weights   06/25/14 0502 06/26/14 0459 06/27/14 0426  Weight: 87 kg (191 lb 12.8 oz) 86.4 kg (190 lb 7.6 oz) 86.138 kg (189 lb 14.4 oz)    Lab Results: Basic Metabolic Panel:  Recent Labs  06/26/14 1116 06/27/14 0509  NA 136 139  K 4.3 5.5*  CL 93* 101  CO2 37* 30  GLUCOSE 261* 141*  BUN 34* 37*  CREATININE 2.67* 2.53*   BNP    Component Value Date/Time   BNP 447.7* 06/22/2014 1140   Telemetry: Atrial paced rhythm this morning, rate is in the low 60s.  Assessment/Plan:  1.  Acute on chronic diastolic heart failure-clinically appears to be improving 2.  Stage IV chronic kidney disease-no renal function checked today 3.  History of paroxysmal atrial fibrillation currently in sinus rhythm 4.  Urinary retention with Foley in place  Recommendations:  Potassium up somewhat today.  Discontinue potassium.  Continue intravenous Lasix today.  Weight is still up some       W. Doristine Church  MD [facility] Card[facility]  06/27/2014, 10:40 AM

## 2014-06-27 NOTE — Plan of Care (Signed)
Problem: Phase I Progression Outcomes Goal: EF % per last Echo/documented,Core Reminder form on chart Outcome: Completed/Met Date Met:  06/27/14 EF 45-50%(12-23-13)

## 2014-06-28 ENCOUNTER — Inpatient Hospital Stay (HOSPITAL_COMMUNITY): Payer: Medicare Other

## 2014-06-28 ENCOUNTER — Ambulatory Visit: Payer: Medicare Other | Admitting: Interventional Cardiology

## 2014-06-28 LAB — BASIC METABOLIC PANEL
Anion gap: 6 (ref 5–15)
Anion gap: 5 (ref 5–15)
BUN: 35 mg/dL — ABNORMAL HIGH (ref 6–23)
BUN: 36 mg/dL — AB (ref 6–23)
CALCIUM: 8.8 mg/dL (ref 8.4–10.5)
CO2: 32 mmol/L (ref 19–32)
CO2: 36 mmol/L — ABNORMAL HIGH (ref 19–32)
Calcium: 9.2 mg/dL (ref 8.4–10.5)
Chloride: 97 mmol/L (ref 96–112)
Chloride: 96 mmol/L (ref 96–112)
Creatinine, Ser: 2.49 mg/dL — ABNORMAL HIGH (ref 0.50–1.35)
Creatinine, Ser: 2.53 mg/dL — ABNORMAL HIGH (ref 0.50–1.35)
GFR calc Af Amer: 24 mL/min — ABNORMAL LOW (ref >90)
GFR calc non Af Amer: 21 mL/min — ABNORMAL LOW (ref >90)
GFR calc non Af Amer: 20 mL/min — ABNORMAL LOW (ref >90)
GFR, EST AFRICAN AMERICAN: 24 mL/min — AB (ref >90)
Glucose, Bld: 136 mg/dL — ABNORMAL HIGH (ref 70–99)
Glucose, Bld: 223 mg/dL — ABNORMAL HIGH (ref 70–99)
POTASSIUM: 4.2 mmol/L (ref 3.5–5.1)
POTASSIUM: 4.2 mmol/L (ref 3.5–5.1)
Sodium: 135 mmol/L (ref 135–145)
Sodium: 137 mmol/L (ref 135–145)

## 2014-06-28 LAB — GLUCOSE, CAPILLARY
GLUCOSE-CAPILLARY: 131 mg/dL — AB (ref 70–99)
GLUCOSE-CAPILLARY: 223 mg/dL — AB (ref 70–99)
GLUCOSE-CAPILLARY: 266 mg/dL — AB (ref 70–99)
Glucose-Capillary: 177 mg/dL — ABNORMAL HIGH (ref 70–99)

## 2014-06-28 MED ORDER — FUROSEMIDE 10 MG/ML IJ SOLN
120.0000 mg | Freq: Once | INTRAVENOUS | Status: DC
  Filled 2014-06-28: qty 12

## 2014-06-28 MED ORDER — FUROSEMIDE 10 MG/ML IJ SOLN
120.0000 mg | Freq: Two times a day (BID) | INTRAVENOUS | Status: AC
  Administered 2014-06-28: 120 mg via INTRAVENOUS
  Filled 2014-06-28: qty 12

## 2014-06-28 MED ORDER — METOLAZONE 5 MG PO TABS
5.0000 mg | ORAL_TABLET | Freq: Every day | ORAL | Status: AC
  Administered 2014-06-28: 5 mg via ORAL
  Filled 2014-06-28: qty 1

## 2014-06-28 NOTE — Progress Notes (Signed)
UR Completed.  [phone]  

## 2014-06-28 NOTE — Progress Notes (Signed)
Pt's urine output today since 0600 with zaroxalyn and lasix 120mg  IV on board, is 1300cc total. Dr [name] contacted and informed of last added output,  Pt will not receive any more lasix this pm. Dr [name] will reassess in the am.

## 2014-06-28 NOTE — Progress Notes (Signed)
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    Subjective: Breathing okay.  No CP. Has some shakiness.   Objective: Vital signs in last 24 hours: Temp:  [98 F (36.7 C)-98.4 F (36.9 C)] 98.3 F (36.8 C) (02/15 0614) Pulse Rate:  [61-69] 62 (02/15 0614) Resp:  [20] 20 (02/15 0614) BP: (118-125)/(55-61) 118/55 mmHg (02/15 0614) SpO2:  [91 %-95 %] 94 % (02/15 0614) Weight:  [194 lb 3.6 oz (88.1 kg)-194 lb 8 oz (88.225 kg)] 194 lb 3.6 oz (88.1 kg) (02/15 0700) Last BM Date: 06/26/14  Intake/Output from previous day: 02/14 0701 - 02/15 0700 In: 1080 [P.O.:1080] Out: 1075 [Urine:1075] Intake/Output this shift:    Medications Current Facility-Administered Medications  Medication Dose Route Frequency Provider Last Rate Last Dose  . 0.9 %  sodium chloride infusion  250 mL Intravenous PRN Isaiah Serge, NP      . acetaminophen (TYLENOL) tablet 650 mg  650 mg Oral Q4H PRN Isaiah Serge, NP      . allopurinol (ZYLOPRIM) tablet 100 mg  100 mg Oral BID Isaiah Serge, NP   100 mg at 06/27/14 2134  . amiodarone (PACERONE) tablet 200 mg  200 mg Oral QHS Isaiah Serge, NP   200 mg at 06/27/14 2134  . apixaban (ELIQUIS) tablet 2.5 mg  2.5 mg Oral BID Isaiah Serge, NP   2.5 mg at 06/27/14 2134  . dutasteride (AVODART) capsule 0.5 mg  0.5 mg Oral Daily Isaiah Serge, NP   0.5 mg at 06/27/14 1302  . ferrous sulfate tablet 325 mg  325 mg Oral QODAY Isaiah Serge, NP   325 mg at 06/27/14 1302  . furosemide (LASIX) 120 mg in dextrose 5 % 50 mL IVPB  120 mg Intravenous BID Belva Crome III, MD      . insulin aspart (novoLOG) injection 0-5 Units  0-5 Units Subcutaneous QHS Isaiah Serge, NP   2 Units at 06/26/14 2114  . insulin aspart (novoLOG) injection 0-9 Units  0-9 Units Subcutaneous TID WC Isaiah Serge, NP   1  Units at 06/28/14 [phone]  . insulin glargine (LANTUS) injection 6 Units  6 Units Subcutaneous QHS Isaiah Serge, NP   6 Units at 06/27/14 2201  . isosorbide mononitrate (IMDUR) 24 hr tablet 60 mg  60 mg Oral Daily Isaiah Serge, NP   60 mg at 06/27/14 1259  . latanoprost (XALATAN) 0.005 % ophthalmic solution 1 drop  1 drop Both Eyes QHS Isaiah Serge, NP   1 drop at 06/22/14 2200  . metolazone (ZAROXOLYN) tablet 5 mg  5 mg Oral Daily Belva Crome III, MD      . metoprolol tartrate (LOPRESSOR) tablet 25 mg  25 mg Oral Daily Isaiah Serge, NP   25 mg at 06/27/14 1132  . multivitamin with minerals tablet 1 tablet  1 tablet Oral Daily Isaiah Serge, NP   1 tablet at 06/27/14 1132  . nitroGLYCERIN (NITROSTAT) SL tablet 0.4 mg  0.4 mg Sublingual Q5 min PRN Isaiah Serge, NP      . ondansetron [facility]) injection 4 mg  4 mg Intravenous Q6H PRN Isaiah Serge, NP   4 mg at 06/27/14 1928  . pantoprazole (PROTONIX) EC tablet 40 mg  40  mg Oral Q0600 Dayna N Dunn, PA-C   40 mg at 06/28/14 8786  . polyethylene glycol (MIRALAX / GLYCOLAX) packet 17 g  17 g Oral BID PRN Isaiah Serge, NP   17 g at 06/27/14 2136  . polyvinyl alcohol (LIQUIFILM TEARS) 1.4 % ophthalmic solution 1 drop  1 drop Both Eyes BID Isaiah Serge, NP   1 drop at 06/24/14 1000  . simethicone (MYLICON) chewable tablet 80 mg  80 mg Oral Once Eileen Stanford, PA-C   80 mg at 06/24/14 1200  . sodium chloride (OCEAN) 0.65 % nasal spray 1 spray  1 spray Each Nare PRN Elsie Amis, MD   1 spray at 06/24/14 2227  . sodium chloride 0.9 % injection 3 mL  3 mL Intravenous Q12H Isaiah Serge, NP   3 mL at 06/27/14 2138  . sodium chloride 0.9 % injection 3 mL  3 mL Intravenous PRN Isaiah Serge, NP      . tamsulosin [facility]) capsule 0.8 mg  0.8 mg Oral QHS Isaiah Serge, NP   0.8 mg at 06/27/14 2134   Filed Weights   06/27/14 0426 06/28/14 0614 06/28/14 0700  Weight: 189 lb 14.4 oz (86.138 kg) 194 lb 8 oz (88.225 kg) 194 lb 3.6 oz (88.1  kg)   PE: General appearance: alert, cooperative and no distress Neck: no carotid bruit and no JVD Lungs: decreased BS at the bases. Bilateral basilar rales L>R Heart: regular rate and rhythm, S1, S2 normal, no murmur, click, rub or gallop Extremities: trace- 1+ LEE Pulses: 2+ and symmetric Skin: warm and dry Neurologic: Grossly normal  Lab Results:   Recent Labs  06/26/14 1116  WBC 5.5  HGB 10.4*  HCT 33.0*  PLT 186   BMET  Recent Labs  06/26/14 1116 06/27/14 0509 06/28/14 0521  NA 136 139 135  K 4.3 5.5* 4.2  CL 93* 101 97  CO2 37* 30 32  GLUCOSE 261* 141* 136*  BUN 34* 37* 35*  CREATININE 2.67* 2.53* 2.49*  CALCIUM 9.0 9.2 8.8   BNP (last 3 results)  Recent Labs  05/27/14 0512 06/22/14 1140  BNP 465.0* 447.7*    ProBNP (last 3 results)  Recent Labs  12/22/13 1504 12/23/13 0535 04/04/14 0742  PROBNP 3984.0* 4564.0* 4874.0*     Assessment/Plan  Principal Problem:   Acute on chronic diastolic CHF (congestive heart failure), NYHA class 3 Active Problems:   Type 2 diabetes mellitus with peripheral neuropathy   Paroxysmal atrial fibrillation, DCCV 06/16/13   Chronic anticoagulation   Pacemaker - St Jude   CKD (chronic kidney disease) stage 4, GFR 15-29 ml/min   Chronic diastolic heart failure   Elevated troponin I level   1. Acute on Chronic Diastolic CHF/Pulmonary Edema: good diuresis on IV Lasix initially. Weight down 196--> 187 lbs. We then switched him to home dose of torsemide on Thursday of last week and his weight increased to 191lbs. He was then placed on IV lasix 80mg  BID and weight increased even further. Now 194 lbs. New goal weight~ 185lbs.   -- Today will give metolazone 5mg  30 minutes before and place on lasix gtt . Will reassess later this afternoon.  -- Continue strict I/Os, daily weights and low sodium diet.   2. Urinary Retention: Bladder scan on admit revealed 450 cc urine. Foley catheter now in place. Continue Flomax.   3.  CKD: Scr stable from admit- creat 2.49 today. Foley still in place  4. PAF: SR. HR well controlled. Continue amiodarone + metoprolol for rhythm/rate control. On low dose Eliquis for stroke prophylaxis.  CHA2DS2VASc is 7   5. H/o SSS: Has functioning PPM. Paced rhythm on telemetry. Rate in the 60s.   6. HTN: stable and well controlled at 118/55. Will need to monitor closely in the setting of IV lasix therapy with multiple other antihypertensives including Imdur, metoprolol and Flomax   7. Hypokalemia: Repleted. His K was slightly elevated to 5.5 yesterday and K supplementation was discontinued.   8. Elevated Troponin: minimally elevated and flat trend. Denies CP. Likely subsequent to demand ischemia in the setting of a/c CHF exacerbation. No further w/u indicated.   9. Deconditioning- continue PT   LOS: 6 days    [name] 06/28/2014 7:44 AM

## 2014-06-28 NOTE — Progress Notes (Signed)
Physical Therapy Treatment Patient Details Name: [name] MRN: [id number] DOB: 11/02/1918 Today's Date: 06/28/2014    History of Present Illness Patient is a 81 y.o male admitted with h/o diastolic CHF, PAF on Eliquis, SSS s/p PPM, HTN, HLD, stage 4 CKD and h/o urinary retention, who was admitted 06/22/14 for pulmonary edema 2/2 acute on chronic diastolic CHF.     PT Comments    Pt limited today by myoclonic type jerks causing his knees to give/begin to buckle. Pt also with some jerks in his UE's. Didn't attempt ambulation due to this.  Follow Up Recommendations  Home health PT;Supervision/Assistance - 24 hour (pt may decline HHPT)     Equipment Recommendations  None recommended by PT    Recommendations for Other Services       Precautions / Restrictions Precautions Precautions: Fall Precaution Comments: Pt with myoclonic type jerks causing knees to buckle    Mobility  Bed Mobility Overal bed mobility: Needs Assistance Bed Mobility: Supine to Sit;Sit to Supine     Supine to sit: Min assist Sit to supine: Supervision   General bed mobility comments: Assist to bring trunk up.  Transfers Overall transfer level: Needs assistance Equipment used: 4-wheeled walker Transfers: Sit to/from Stand Sit to Stand: Min assist         General transfer comment: Assist for balance and support when rising.  Ambulation/Gait Ambulation/Gait assistance: Min assist Ambulation Distance (Feet): 2 Feet (side-stepping)         General Gait Details: Pt only side-stepped up side of bed with walker due to myoclonic type jerks causing knees to give.   Stairs            Wheelchair Mobility    Modified Rankin (Stroke Patients Only)       Balance Overall balance assessment: Needs assistance Sitting-balance support: No upper extremity supported;Feet supported Sitting balance-Leahy Scale: Good     Standing balance support: Bilateral upper extremity supported Standing  balance-Leahy Scale: Poor Standing balance comment: UE support                    Cognition Arousal/Alertness: Awake/alert Behavior During Therapy: WFL for tasks assessed/performed Overall Cognitive Status: Within Functional Limits for tasks assessed                      Exercises      General Comments        Pertinent Vitals/Pain Pain Assessment: No/denies pain    Home Living                      Prior Function            PT Goals (current goals can now be found in the care plan section) Progress towards PT goals: Not progressing toward goals - comment (due to myoclonic type jerks)    Frequency  Min 3X/week    PT Plan Current plan remains appropriate    Co-evaluation             End of Session Equipment Utilized During Treatment: Gait belt Activity Tolerance: Patient limited by fatigue;Other (comment) (and myoclonic type jerks) Patient left: in bed;with call bell/phone within reach;with family/visitor present     Time: 7026-3785 PT Time Calculation (min) (ACUTE ONLY): 12 min  Charges:  $Gait Training: 8-22 mins                    G Codes:  Cayleigh Paull 06/28/2014, 2:08 PM  [facility] PT [phone]

## 2014-06-29 DIAGNOSIS — E1140 Type 2 diabetes mellitus with diabetic neuropathy, unspecified: Secondary | ICD-10-CM

## 2014-06-29 LAB — BASIC METABOLIC PANEL
ANION GAP: 14 (ref 5–15)
BUN: 37 mg/dL — ABNORMAL HIGH (ref 6–23)
CHLORIDE: 93 mmol/L — AB (ref 96–112)
CO2: 29 mmol/L (ref 19–32)
CREATININE: 2.62 mg/dL — AB (ref 0.50–1.35)
Calcium: 9.2 mg/dL (ref 8.4–10.5)
GFR calc non Af Amer: 20 mL/min — ABNORMAL LOW (ref >90)
GFR, EST AFRICAN AMERICAN: 23 mL/min — AB (ref >90)
Glucose, Bld: 174 mg/dL — ABNORMAL HIGH (ref 70–99)
POTASSIUM: 3.6 mmol/L (ref 3.5–5.1)
SODIUM: 136 mmol/L (ref 135–145)

## 2014-06-29 LAB — GLUCOSE, CAPILLARY
GLUCOSE-CAPILLARY: 196 mg/dL — AB (ref 70–99)
Glucose-Capillary: 111 mg/dL — ABNORMAL HIGH (ref 70–99)
Glucose-Capillary: 213 mg/dL — ABNORMAL HIGH (ref 70–99)
Glucose-Capillary: 240 mg/dL — ABNORMAL HIGH (ref 70–99)

## 2014-06-29 LAB — CBC
HCT: 29.8 % — ABNORMAL LOW (ref 39.0–52.0)
Hemoglobin: 9.8 g/dL — ABNORMAL LOW (ref 13.0–17.0)
MCH: 32.9 pg (ref 26.0–34.0)
MCHC: 32.9 g/dL (ref 30.0–36.0)
MCV: 100 fL (ref 78.0–100.0)
Platelets: 179 10*3/uL (ref 150–400)
RBC: 2.98 MIL/uL — AB (ref 4.22–5.81)
RDW: 15.5 % (ref 11.5–15.5)
WBC: 5.1 10*3/uL (ref 4.0–10.5)

## 2014-06-29 MED ORDER — FUROSEMIDE 10 MG/ML IJ SOLN
100.0000 mg | Freq: Once | INTRAMUSCULAR | Status: AC
  Administered 2014-06-29: 100 mg via INTRAVENOUS
  Filled 2014-06-29 (×2): qty 10

## 2014-06-29 MED ORDER — METOLAZONE 5 MG PO TABS
5.0000 mg | ORAL_TABLET | Freq: Every day | ORAL | Status: DC
  Administered 2014-06-29: 5 mg via ORAL
  Filled 2014-06-29 (×2): qty 1

## 2014-06-29 NOTE — Progress Notes (Addendum)
Physical Therapy Treatment Patient Details Name: [name] MRN: [id number] DOB: Dec 12, 1918 Today's Date: 06/29/2014    History of Present Illness Patient is a 81 y.o male admitted with h/o diastolic CHF, PAF on Eliquis, SSS s/p PPM, HTN, HLD, stage 4 CKD and h/o urinary retention, who was admitted 06/22/14 for pulmonary edema 2/2 acute on chronic diastolic CHF.     PT Comments    Pt able to amb today but with knees buckling after 80'.  After resting in chair pt attempted further amb but knees buckling vs myoclonic type jerks in standing and pt returned to sitting. SaO2 87-88% on RA with amb. Returned to 91-92% at rest.  Follow Up Recommendations  Home health PT;Supervision/Assistance - 24 hour (pt may decline HHPT)     Equipment Recommendations  None recommended by PT    Recommendations for Other Services       Precautions / Restrictions Precautions Precautions: Fall Precaution Comments: Pt with knees buckling with weakness vs myoclonic jerks.    Mobility  Bed Mobility Overal bed mobility: Needs Assistance Bed Mobility: Supine to Sit     Supine to sit: Min assist     General bed mobility comments: Assist to bring trunk up.  Transfers Overall transfer level: Needs assistance Equipment used: 4-wheeled walker Transfers: Sit to/from Stand Sit to Stand: +2 safety/equipment;Min assist         General transfer comment: Assist for balance and support when rising.  Ambulation/Gait Ambulation/Gait assistance: +2 safety/equipment;Min assist;Mod assist Ambulation Distance (Feet): 80 Feet Assistive device: 4-wheeled walker Gait Pattern/deviations: Step-through pattern;Decreased step length - right;Decreased step length - left;Trunk flexed     General Gait Details: Assist for balance and support with close following of chair behind. Pt with knees buckling at 69' requiring mod A to recover. Sat in chair and rested. Attempted amb again but pt with knees buckling vs  myoclonic jerks with static standing so returned pt to chair.   Stairs            Wheelchair Mobility    Modified Rankin (Stroke Patients Only)       Balance   Sitting-balance support: No upper extremity supported Sitting balance-Leahy Scale: Good     Standing balance support: Bilateral upper extremity supported Standing balance-Leahy Scale: Poor Standing balance comment: support of walker and min A                    Cognition Arousal/Alertness: Awake/alert Behavior During Therapy: WFL for tasks assessed/performed Overall Cognitive Status: Within Functional Limits for tasks assessed                      Exercises      General Comments        Pertinent Vitals/Pain Pain Assessment: No/denies pain    Home Living                      Prior Function            PT Goals (current goals can now be found in the care plan section) Progress towards PT goals: Progressing toward goals    Frequency  Min 3X/week    PT Plan Current plan remains appropriate    Co-evaluation             End of Session Equipment Utilized During Treatment: Gait belt Activity Tolerance: Patient limited by fatigue Patient left: in chair;with call bell/phone within reach;with family/visitor present  Time: 6759-1638 PT Time Calculation (min) (ACUTE ONLY): 23 min  Charges:  $Gait Training: 23-37 mins                    G Codes:      [name] 07/18/2014, 2:03 PM  [facility] PT [phone]

## 2014-06-29 NOTE — Progress Notes (Signed)
       Patient Name: [name] Date of Encounter: 06/29/2014    SUBJECTIVE:Concerned that his O2 sats drop into the 80's. He denies chest pain. Slept last PM.  TELEMETRY:  Paced  Filed Vitals:   06/28/14 1452 06/28/14 2200 06/28/14 2300 06/29/14 0644  BP: 123/53 121/65  140/64  Pulse: 61 62  64  Temp: 97.8 F (36.6 C) 98 F (36.7 C)  97.8 F (36.6 C)  TempSrc: Oral Oral  Oral  Resp: 20 16  18   Height:      Weight:    195 lb 1.7 oz (88.5 kg)  SpO2: 94% 91% 94% 100%    Intake/Output Summary (Last 24 hours) at 06/29/14 0840 Last data filed at 06/29/14 0645  Gross per 24 hour  Intake   1080 ml  Output   2100 ml  Net  -1020 ml   LABS: Basic Metabolic Panel:  Recent Labs  06/28/14 0521 06/28/14 1057  NA 135 137  K 4.2 4.2  CL 97 96  CO2 32 36*  GLUCOSE 136* 223*  BUN 35* 36*  CREATININE 2.49* 2.53*  CALCIUM 8.8 9.2   CBC:  Recent Labs  06/26/14 1116 06/29/14 0327  WBC 5.5 5.1  HGB 10.4* 9.8*  HCT 33.0* 29.8*  MCV 101.9* 100.0  PLT 186 179    Radiology/Studies:  Slight improvement in CHF on yesterdays CXR  Physical Exam: Blood pressure 140/64, pulse 64, temperature 97.8 F (36.6 C), temperature source Oral, resp. rate 18, height 5\' 7"  (1.702 m), weight 195 lb 1.7 oz (88.5 kg), SpO2 100 %. Weight change: 9.7 oz (0.275 kg)  Wt Readings from Last 3 Encounters:  06/29/14 195 lb 1.7 oz (88.5 kg)  06/16/14 188 lb 1.6 oz (85.322 kg)  06/08/14 186 lb 1.6 oz (84.414 kg)   Decreased breath sounds in both bases and mid lung rales.  ASSESSMENT:  1. 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 Multiple readmissions for CHF. I believe we are now at end-stage 2. Acute on chronic kidney failure. We have to diurese strongly to improve respiratory component, but renal function will deteriorate. 3. CAD without angina.  Plan:  1. Repeat furosemide and metolazone today 2. Stat BMET 3. May re-discuss end of life  measures to align goals. 4. Fluid restriction   Demetrios Isaacs 06/29/2014, 8:40 AM

## 2014-06-30 LAB — BASIC METABOLIC PANEL
ANION GAP: 11 (ref 5–15)
Anion gap: 5 (ref 5–15)
BUN: 38 mg/dL — AB (ref 6–23)
BUN: 37 mg/dL — AB (ref 6–23)
CALCIUM: 9.2 mg/dL (ref 8.4–10.5)
CO2: 36 mmol/L — ABNORMAL HIGH (ref 19–32)
CO2: 31 mmol/L (ref 19–32)
CREATININE: 2.58 mg/dL — AB (ref 0.50–1.35)
Calcium: 8.9 mg/dL (ref 8.4–10.5)
Chloride: 93 mmol/L — ABNORMAL LOW (ref 96–112)
Chloride: 92 mmol/L — ABNORMAL LOW (ref 96–112)
Creatinine, Ser: 2.72 mg/dL — ABNORMAL HIGH (ref 0.50–1.35)
GFR calc Af Amer: 22 mL/min — ABNORMAL LOW (ref >90)
GFR, EST AFRICAN AMERICAN: 23 mL/min — AB (ref >90)
GFR, EST NON AFRICAN AMERICAN: 19 mL/min — AB (ref >90)
GFR, EST NON AFRICAN AMERICAN: 20 mL/min — AB (ref >90)
Glucose, Bld: 149 mg/dL — ABNORMAL HIGH (ref 70–99)
Glucose, Bld: 219 mg/dL — ABNORMAL HIGH (ref 70–99)
Potassium: 3.4 mmol/L — ABNORMAL LOW (ref 3.5–5.1)
Potassium: 3.4 mmol/L — ABNORMAL LOW (ref 3.5–5.1)
SODIUM: 134 mmol/L — AB (ref 135–145)
Sodium: 134 mmol/L — ABNORMAL LOW (ref 135–145)

## 2014-06-30 LAB — GLUCOSE, CAPILLARY
Glucose-Capillary: 134 mg/dL — ABNORMAL HIGH (ref 70–99)
Glucose-Capillary: 204 mg/dL — ABNORMAL HIGH (ref 70–99)
Glucose-Capillary: 220 mg/dL — ABNORMAL HIGH (ref 70–99)
Glucose-Capillary: 222 mg/dL — ABNORMAL HIGH (ref 70–99)

## 2014-06-30 LAB — CBC
HCT: 33.8 % — ABNORMAL LOW (ref 39.0–52.0)
Hemoglobin: 11.2 g/dL — ABNORMAL LOW (ref 13.0–17.0)
MCH: 33.4 pg (ref 26.0–34.0)
MCHC: 33.1 g/dL (ref 30.0–36.0)
MCV: 100.9 fL — ABNORMAL HIGH (ref 78.0–100.0)
Platelets: 178 10*3/uL (ref 150–400)
RBC: 3.35 MIL/uL — ABNORMAL LOW (ref 4.22–5.81)
RDW: 15.7 % — AB (ref 11.5–15.5)
WBC: 5.2 10*3/uL (ref 4.0–10.5)

## 2014-06-30 MED ORDER — DEXTROSE 5 % IV SOLN
120.0000 mg | Freq: Once | INTRAVENOUS | Status: AC
  Administered 2014-06-30: 120 mg via INTRAVENOUS
  Filled 2014-06-30: qty 12

## 2014-06-30 MED ORDER — METOLAZONE 5 MG PO TABS
5.0000 mg | ORAL_TABLET | Freq: Every day | ORAL | Status: AC
  Administered 2014-06-30: 5 mg via ORAL
  Filled 2014-06-30: qty 1

## 2014-06-30 NOTE — Progress Notes (Signed)
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    Subjective: Feeling well. No SOB or coughing. Slept well.   Objective: Vital signs in last 24 hours: Temp:  [97.8 F (36.6 C)-98.2 F (36.8 C)] 97.8 F (36.6 C) (02/17 0641) Pulse Rate:  [63-64] 63 (02/17 1029) Resp:  [18] 18 (02/17 0641) BP: (113-126)/(53-70) 126/53 mmHg (02/17 1029) SpO2:  [91 %-95 %] 95 % (02/17 0641) Weight:  [193 lb 9 oz (87.8 kg)] 193 lb 9 oz (87.8 kg) (02/17 0641) Last BM Date: 06/28/14  Intake/Output from previous day: 02/16 0701 - 02/17 0700 In: 600 [P.O.:600] Out: 2000 [Urine:2000] Intake/Output this shift:    Medications Current Facility-Administered Medications  Medication Dose Route Frequency Provider Last Rate Last Dose  . 0.9 %  sodium chloride infusion  250 mL Intravenous PRN Isaiah Serge, NP 10 mL/hr at 06/28/14 0937 250 mL at 06/28/14 0937  . acetaminophen (TYLENOL) tablet 650 mg  650 mg Oral Q4H PRN Isaiah Serge, NP      . allopurinol (ZYLOPRIM) tablet 100 mg  100 mg Oral BID Isaiah Serge, NP   100 mg at 06/30/14 1029  . amiodarone (PACERONE) tablet 200 mg  200 mg Oral QHS Isaiah Serge, NP   200 mg at 06/29/14 2215  . apixaban (ELIQUIS) tablet 2.5 mg  2.5 mg Oral BID Isaiah Serge, NP   2.5 mg at 06/30/14 1029  . dutasteride (AVODART) capsule 0.5 mg  0.5 mg Oral Daily Isaiah Serge, NP   0.5 mg at 06/30/14 1029  . ferrous sulfate tablet 325 mg  325 mg Oral QODAY Isaiah Serge, NP   325 mg at 06/29/14 9767  . furosemide (LASIX) 120 mg in dextrose 5 % 50 mL IVPB  120 mg Intravenous Once Belva Crome III, MD   120 mg at 06/30/14 1030  . insulin aspart (novoLOG) injection 0-5 Units  0-5 Units Subcutaneous QHS Isaiah Serge, NP   2 Units at 06/29/14 2215  . insulin aspart (novoLOG) injection 0-9 Units  0-9 Units  Subcutaneous TID WC Isaiah Serge, NP   1 Units at 06/30/14 [phone]  . insulin glargine (LANTUS) injection 6 Units  6 Units Subcutaneous QHS Isaiah Serge, NP   6 Units at 06/29/14 2215  . isosorbide mononitrate (IMDUR) 24 hr tablet 60 mg  60 mg Oral Daily Isaiah Serge, NP   60 mg at 06/30/14 1030  . latanoprost (XALATAN) 0.005 % ophthalmic solution 1 drop  1 drop Both Eyes QHS Isaiah Serge, NP   1 drop at 06/22/14 2200  . metoprolol tartrate (LOPRESSOR) tablet 25 mg  25 mg Oral Daily Isaiah Serge, NP   25 mg at 06/30/14 1029  . multivitamin with minerals tablet 1 tablet  1 tablet Oral Daily Isaiah Serge, NP   1 tablet at 06/30/14 1029  . nitroGLYCERIN (NITROSTAT) SL tablet 0.4 mg  0.4 mg Sublingual Q5 min PRN Isaiah Serge, NP      . ondansetron [facility]) injection 4 mg  4 mg Intravenous Q6H PRN Isaiah Serge, NP   4 mg at 06/28/14 1757  . pantoprazole (PROTONIX) EC tablet 40 mg  40 mg Oral Q0600 Dayna N Dunn, PA-C   40 mg at 06/30/14 0651  . polyethylene  glycol (MIRALAX / GLYCOLAX) packet 17 g  17 g Oral BID PRN Isaiah Serge, NP   17 g at 06/29/14 2227  . polyvinyl alcohol (LIQUIFILM TEARS) 1.4 % ophthalmic solution 1 drop  1 drop Both Eyes BID Isaiah Serge, NP   1 drop at 06/30/14 1030  . simethicone (MYLICON) chewable tablet 80 mg  80 mg Oral Once Eileen Stanford, PA-C   80 mg at 06/24/14 1200  . sodium chloride (OCEAN) 0.65 % nasal spray 1 spray  1 spray Each Nare PRN Elsie Amis, MD   1 spray at 06/24/14 2227  . sodium chloride 0.9 % injection 3 mL  3 mL Intravenous Q12H Isaiah Serge, NP   3 mL at 06/30/14 1030  . sodium chloride 0.9 % injection 3 mL  3 mL Intravenous PRN Isaiah Serge, NP      . tamsulosin [facility]) capsule 0.8 mg  0.8 mg Oral QHS Isaiah Serge, NP   0.8 mg at 06/29/14 2215   Filed Weights   06/28/14 0700 06/29/14 0644 06/30/14 0641  Weight: 194 lb 3.6 oz (88.1 kg) 195 lb 1.7 oz (88.5 kg) 193 lb 9 oz (87.8 kg)   PE: General appearance: alert,  cooperative and no distress Neck: no carotid bruit and no JVD Lungs: decreased BS at the bases. Bilateral basilar rales L>R Heart: regular rate and rhythm, S1, S2 normal, no murmur, click, rub or gallop Extremities: trace- 1+ LEE Pulses: 2+ and symmetric Skin: warm and dry Neurologic: Grossly normal  Lab Results:   Recent Labs  06/29/14 0327  WBC 5.1  HGB 9.8*  HCT 29.8*  PLT 179   BMET  Recent Labs  06/28/14 1057 06/29/14 0936 06/30/14 0440  NA 137 136 134*  K 4.2 3.6 3.4*  CL 96 93* 93*  CO2 36* 29 36*  GLUCOSE 223* 174* 149*  BUN 36* 37* 38*  CREATININE 2.53* 2.62* 2.72*  CALCIUM 9.2 9.2 8.9   BNP (last 3 results)  Recent Labs  05/27/14 0512 06/22/14 1140  BNP 465.0* 447.7*    ProBNP (last 3 results)  Recent Labs  12/22/13 1504 12/23/13 0535 04/04/14 0742  PROBNP 3984.0* 4564.0* 4874.0*     Assessment/Plan  Principal Problem:   Acute on chronic diastolic CHF (congestive heart failure), NYHA class 3 Active Problems:   Type 2 diabetes mellitus with peripheral neuropathy   Paroxysmal atrial fibrillation, DCCV 06/16/13   Chronic anticoagulation   Pacemaker - St Jude   CKD (chronic kidney disease) stage 4, GFR 15-29 ml/min   Chronic diastolic heart failure   Elevated troponin I level   81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   1. Acute on Chronic Diastolic CHF/Pulmonary Edema: good diuresis on IV Lasix initially. Weight down 196--> 187 lbs. We then switched him to home dose of torsemide on Thursday of last week and his weight increased to 191lbs. He was then placed on IV lasix 80mg  BID and weight increased even further. Now 194 lbs. New goal weight~ 185lbs.   -- He was placed on IV lasix and metolazone on 2/15 and 2/16. Weight and creat w/ minimal improvements. Net neg ~5L since  admission.  Multiple readmissions for CHF. [name] believes we are now at end-stage. May re-discuss end of life measures to align goals. --  Continue strict I/Os, daily weights and low sodium diet.   2. Urinary Retention: Bladder scan on admit revealed 450 cc urine. Foley catheter now in place. Continue Flomax.   3. CKD: Scr still elevated-  creat 2.72 today. Foley still in place   4. PAF: SR. HR well controlled. Continue amiodarone + metoprolol for rhythm/rate control. On low dose Eliquis for stroke prophylaxis.  CHA2DS2VASc is 7   5. H/o SSS: Has functioning PPM. Paced rhythm on telemetry. Rate in the 60s.   6. HTN: stable and well controlled at 126/53. Will need to monitor closely in the setting of IV lasix therapy with multiple other antihypertensives including Imdur, metoprolol and Flomax   7. Hypokalemia: 3.3 in the setting of aggressive diuresis. Will replete.   8. Elevated Troponin: minimally elevated and flat trend. Denies CP. Likely subsequent to demand ischemia in the setting of a/c CHF exacerbation. No further w/u indicated.   9. Deconditioning- continue PT   LOS: 8 days    K. Billee Cashing 06/30/2014 10:36 AM

## 2014-06-30 NOTE — Progress Notes (Signed)
Physical Therapy Treatment Patient Details Name: [name] MRN: [id number] DOB: 01-25-1919 Today's Date: 06/30/2014    History of Present Illness Patient is a 81 y.o male admitted with h/o diastolic CHF, PAF on Eliquis, SSS s/p PPM, HTN, HLD, stage 4 CKD and h/o urinary retention, who was admitted 06/22/14 for pulmonary edema 2/2 acute on chronic diastolic CHF.     PT Comments    Pt making steady progress. Pt able to amb further total distance by taking sitting rest breaks prior to reaching level of fatigue and having knees buckle.  Follow Up Recommendations  Home health PT;Supervision/Assistance - 24 hour     Equipment Recommendations  None recommended by PT    Recommendations for Other Services       Precautions / Restrictions Precautions Precautions: Fall    Mobility  Bed Mobility Overal bed mobility: Needs Assistance Bed Mobility: Supine to Sit     Supine to sit: Min assist     General bed mobility comments: Assist to bring trunk up.  Transfers Overall transfer level: Needs assistance Equipment used: 4-wheeled walker Transfers: Sit to/from Stand Sit to Stand: +2 safety/equipment;Min assist         General transfer comment: Assist for balance and support when rising.  Ambulation/Gait Ambulation/Gait assistance: +2 safety/equipment;Min assist Ambulation Distance (Feet): 100 Feet (110' x 1, 55' x 1) Assistive device: 4-wheeled walker Gait Pattern/deviations: Step-through pattern;Decreased step length - right;Decreased step length - left;Narrow base of support;Trunk flexed     General Gait Details: Assist for balance and support with close following of chair behind in case knees buckled. Stopped pt x 2 to have pt sit and rest prior to fatiguing to the point of knees buckling.   Stairs            Wheelchair Mobility    Modified Rankin (Stroke Patients Only)       Balance Overall balance assessment: Needs assistance Sitting-balance support:  No upper extremity supported Sitting balance-Leahy Scale: Good     Standing balance support: Bilateral upper extremity supported Standing balance-Leahy Scale: Poor Standing balance comment: support of walker and min guard A for static standing.                    Cognition Arousal/Alertness: Awake/alert Behavior During Therapy: WFL for tasks assessed/performed Overall Cognitive Status: Within Functional Limits for tasks assessed                      Exercises      General Comments        Pertinent Vitals/Pain Pain Assessment: No/denies pain    Home Living                      Prior Function            PT Goals (current goals can now be found in the care plan section) Progress towards PT goals: Progressing toward goals    Frequency  Min 3X/week    PT Plan Current plan remains appropriate    Co-evaluation             End of Session Equipment Utilized During Treatment: Gait belt Activity Tolerance: Patient tolerated treatment well Patient left: in chair;with call bell/phone within reach;with family/visitor present     Time: 1209-1230 PT Time Calculation (min) (ACUTE ONLY): 21 min  Charges:  $Gait Training: 8-22 mins  G Codes:      [name] 06/30/2014, 2:02 PM  [facility] PT [phone]

## 2014-07-01 ENCOUNTER — Telehealth: Payer: Self-pay | Admitting: Cardiology

## 2014-07-01 LAB — GLUCOSE, CAPILLARY
GLUCOSE-CAPILLARY: 247 mg/dL — AB (ref 70–99)
GLUCOSE-CAPILLARY: 224 mg/dL — AB (ref 70–99)
GLUCOSE-CAPILLARY: 232 mg/dL — AB (ref 70–99)
Glucose-Capillary: 110 mg/dL — ABNORMAL HIGH (ref 70–99)

## 2014-07-01 LAB — BASIC METABOLIC PANEL
Anion gap: 6 (ref 5–15)
BUN: 38 mg/dL — ABNORMAL HIGH (ref 6–23)
CHLORIDE: 91 mmol/L — AB (ref 96–112)
CO2: 38 mmol/L — AB (ref 19–32)
Calcium: 9 mg/dL (ref 8.4–10.5)
Creatinine, Ser: 2.54 mg/dL — ABNORMAL HIGH (ref 0.50–1.35)
GFR, EST AFRICAN AMERICAN: 24 mL/min — AB (ref >90)
GFR, EST NON AFRICAN AMERICAN: 20 mL/min — AB (ref >90)
GLUCOSE: 112 mg/dL — AB (ref 70–99)
POTASSIUM: 2.8 mmol/L — AB (ref 3.5–5.1)
SODIUM: 135 mmol/L (ref 135–145)

## 2014-07-01 MED ORDER — POTASSIUM CHLORIDE CRYS ER 20 MEQ PO TBCR
40.0000 meq | EXTENDED_RELEASE_TABLET | Freq: Three times a day (TID) | ORAL | Status: AC
  Administered 2014-07-01 (×3): 40 meq via ORAL
  Filled 2014-07-01 (×3): qty 2

## 2014-07-01 MED ORDER — METOLAZONE 5 MG PO TABS
5.0000 mg | ORAL_TABLET | Freq: Every day | ORAL | Status: AC
  Administered 2014-07-01: 5 mg via ORAL
  Filled 2014-07-01 (×2): qty 1

## 2014-07-01 MED ORDER — FUROSEMIDE 10 MG/ML IJ SOLN
120.0000 mg | Freq: Once | INTRAVENOUS | Status: AC
  Administered 2014-07-01: 120 mg via INTRAVENOUS
  Filled 2014-07-01: qty 12

## 2014-07-01 MED ORDER — POTASSIUM CHLORIDE CRYS ER 20 MEQ PO TBCR
20.0000 meq | EXTENDED_RELEASE_TABLET | Freq: Every day | ORAL | Status: DC
Start: 2014-07-02 — End: 2014-07-03
  Administered 2014-07-02 – 2014-07-03 (×2): 20 meq via ORAL
  Filled 2014-07-01 (×2): qty 1

## 2014-07-01 NOTE — Progress Notes (Signed)
Nutrition Education Note  RD consulted for nutrition education. Caregiver reports having questions about renal diet. Per MD, pt needs to be on Low Sodium/ Diabetic diet with 1.5 L fluid restriction, not a renal diet. Caregiver reports that patient was following low sodium diet PTA. RD to touch base with caregiver/family tomorrow morning to address any additional questions regarding patient's diet.    Pryor Ochoa RD, LDN Inpatient Clinical Dietitian Pager: [phone] After Hours Pager: [phone]

## 2014-07-01 NOTE — Telephone Encounter (Signed)
Reanne was calling in to speak with Dr. Martin[place] to get some clarification on what type of diet he is recommending for the pt. Please call back  Thanks

## 2014-07-01 NOTE — Consult Note (Signed)
Referral received from inpatient [facility] that patient agreed to [facility] services.  Met with patient and son, Boruch, III at the bedside and discussed the benefits of [facility] services.  Patient was in agreement to services. Consent form signed and a folder with [facility] information was given.  Of note, [facility] Care Management services do not replace or interfere with any services arranged by the inpatient care management team.  For questions, please call Natividad Brood, RN, BSN, [facility] Liaison at [phone].

## 2014-07-01 NOTE — Telephone Encounter (Signed)
Returned call to [employer] she stated she already spoke to [name].

## 2014-07-01 NOTE — Progress Notes (Signed)
Physical Therapy Treatment Patient Details Name: [name] MRN: [id number] DOB: 04-Nov-1918 Today's Date: 2014/07/19    History of Present Illness Patient is a 81 y.o male admitted with h/o diastolic CHF, PAF on Eliquis, SSS s/p PPM, HTN, HLD, stage 4 CKD and h/o urinary retention, who was admitted 06/22/14 for pulmonary edema 2/2 acute on chronic diastolic CHF.     PT Comments    Pt making steady progress.  Follow Up Recommendations  Home health PT;Supervision/Assistance - 24 hour     Equipment Recommendations  None recommended by PT    Recommendations for Other Services       Precautions / Restrictions Precautions Precautions: Fall Precaution Comments: Knees buckle if fatigues    Mobility  Bed Mobility               General bed mobility comments: Pt in chair.  Transfers Overall transfer level: Needs assistance Equipment used: 4-wheeled walker Transfers: Sit to/from Stand Sit to Stand: Min guard         General transfer comment: Assist for safety. Verbal cues for hand placement when sitting.  Ambulation/Gait Ambulation/Gait assistance: +2 safety/equipment;Min assist;Min guard Ambulation Distance (Feet): 80 Feet (x 3) Assistive device: 4-wheeled walker Gait Pattern/deviations: Step-through pattern;Decreased step length - right;Decreased step length - left;Narrow base of support;Trunk flexed     General Gait Details: Assist for balance and support with close following of chair behind in case knees buckled. Stopped pt x 2 to have pt sit and rest prior to fatiguing to the point of knees buckling.   Stairs            Wheelchair Mobility    Modified Rankin (Stroke Patients Only)       Balance Overall balance assessment: Needs assistance Sitting-balance support: No upper extremity supported Sitting balance-Leahy Scale: Good     Standing balance support: Bilateral upper extremity supported Standing balance-Leahy Scale: Poor Standing balance  comment: support of walker for static standing                    Cognition Arousal/Alertness: Awake/alert Behavior During Therapy: WFL for tasks assessed/performed Overall Cognitive Status: Within Functional Limits for tasks assessed                      Exercises      General Comments        Pertinent Vitals/Pain Pain Assessment: No/denies pain    Home Living                      Prior Function            PT Goals (current goals can now be found in the care plan section) Progress towards PT goals: Progressing toward goals    Frequency  Min 3X/week    PT Plan Current plan remains appropriate    Co-evaluation             End of Session Equipment Utilized During Treatment: Gait belt Activity Tolerance: Patient tolerated treatment well Patient left: in chair;with call bell/phone within reach;with family/visitor present     Time: 2202-5427 PT Time Calculation (min) (ACUTE ONLY): 19 min  Charges:  $Gait Training: 8-22 mins                    G Codes:      Mistey Hoffert July 19, 2014, 1:05 PM  [employer] PT [phone]

## 2014-07-01 NOTE — Progress Notes (Signed)
       Patient Name: [name] Date of Encounter: 07/01/2014    SUBJECTIVE: Feels weak. No chest pain.   TELEMETRY:  NSR Filed Vitals:   06/30/14 1029 06/30/14 1430 06/30/14 2104 07/01/14 0603  BP: 126/53 132/56 125/65 115/47  Pulse: 63 62 60   Temp:  98.4 F (36.9 C) 98.1 F (36.7 C) 98.1 F (36.7 C)  TempSrc:  Oral Oral Oral  Resp:  18 18 18   Height:      Weight:    189 lb 9.5 oz (86 kg)  SpO2:  93% 94% 93%    Intake/Output Summary (Last 24 hours) at 07/01/14 0739 Last data filed at 07/01/14 0349  Gross per 24 hour  Intake    840 ml  Output   3125 ml  Net  -2285 ml   I/O (net) : 7.55 liters since admission   LABS: Basic Metabolic Panel:  Recent Labs  06/30/14 1140 07/01/14 0554  NA 134* 135  K 3.4* 2.8*  CL 92* 91*  CO2 31 38*  GLUCOSE 219* 112*  BUN 37* 38*  CREATININE 2.58* 2.54*  CALCIUM 9.2 9.0   CBC:  Recent Labs  06/29/14 0327 06/30/14 1140  WBC 5.1 5.2  HGB 9.8* 11.2*  HCT 29.8* 33.8*  MCV 100.0 100.9*  PLT 179 178     Radiology/Studies:  No new data  Physical Exam: Blood pressure 115/47, pulse 60, temperature 98.1 F (36.7 C), temperature source Oral, resp. rate 18, height 5\' 7"  (1.702 m), weight 189 lb 9.5 oz (86 kg), SpO2 93 %. Weight change: -3 lb 15.5 oz (-1.8 kg)  Wt Readings from Last 3 Encounters:  07/01/14 189 lb 9.5 oz (86 kg)  06/16/14 188 lb 1.6 oz (85.322 kg)  06/08/14 186 lb 1.6 oz (84.414 kg)  He weighed 196 lbs on admission, but vary from day to day very much  Chest clearer. No rub or gallop. No edema.  ASSESSMENT:  1. Acute on chronic diastolic heart failure, improving. 2. Chronic kidney diease, stable 3. Hypertension with soft BP's  Plan:  1. Metolazone and lasix once more today and switch to PO tomorrow. 2. Possibly home tomorrow or Saturday.  Demetrios Isaacs 07/01/2014, 7:39 AM

## 2014-07-02 LAB — BASIC METABOLIC PANEL
Anion gap: 9 (ref 5–15)
BUN: 39 mg/dL — ABNORMAL HIGH (ref 6–23)
CALCIUM: 9.2 mg/dL (ref 8.4–10.5)
CO2: 35 mmol/L — ABNORMAL HIGH (ref 19–32)
Chloride: 92 mmol/L — ABNORMAL LOW (ref 96–112)
Creatinine, Ser: 2.6 mg/dL — ABNORMAL HIGH (ref 0.50–1.35)
GFR calc non Af Amer: 20 mL/min — ABNORMAL LOW (ref >90)
GFR, EST AFRICAN AMERICAN: 23 mL/min — AB (ref >90)
GLUCOSE: 153 mg/dL — AB (ref 70–99)
Potassium: 3.8 mmol/L (ref 3.5–5.1)
SODIUM: 136 mmol/L (ref 135–145)

## 2014-07-02 LAB — GLUCOSE, CAPILLARY
GLUCOSE-CAPILLARY: 143 mg/dL — AB (ref 70–99)
GLUCOSE-CAPILLARY: 224 mg/dL — AB (ref 70–99)
GLUCOSE-CAPILLARY: 220 mg/dL — AB (ref 70–99)
Glucose-Capillary: 243 mg/dL — ABNORMAL HIGH (ref 70–99)

## 2014-07-02 MED ORDER — METOLAZONE 5 MG PO TABS
5.0000 mg | ORAL_TABLET | Freq: Every day | ORAL | Status: AC
  Administered 2014-07-02: 5 mg via ORAL
  Filled 2014-07-02: qty 1

## 2014-07-02 MED ORDER — TORSEMIDE 100 MG PO TABS
100.0000 mg | ORAL_TABLET | Freq: Every day | ORAL | Status: DC
  Administered 2014-07-02 – 2014-07-03 (×2): 100 mg via ORAL
  Filled 2014-07-02 (×3): qty 1

## 2014-07-02 NOTE — Progress Notes (Signed)
Discussed Low Sodium/Diabetic Diet with 1.5 L fluid restriction with patient's caregiver. No diet education needs at this time. Changing diet from Renal to Heart Healthy/Carb Modified.   Pryor Ochoa RD, LDN Inpatient Clinical Dietitian Pager: [phone] After Hours Pager: [phone]

## 2014-07-02 NOTE — Progress Notes (Signed)
PT Cancellation Note  Patient Details Name: [name] MRN: [id number] DOB: March 09, 1919   Cancelled Treatment:    Reason Eval/Treat Not Completed: Other (comment) (Pt sleeping soundly and didn't arouse to voice. Allowed pt to continue sleeping)   Zyon Grout 07/02/2014, 3:25 PM

## 2014-07-02 NOTE — Progress Notes (Addendum)
       Patient Name: [name] Date of Encounter: 07/02/2014    SUBJECTIVE: He denies dyspnea. He is sitting at the bedside. O2 saturations are 100% on room air.  TELEMETRY:  Normal sinus rhythm and paced atrial rhythm and to change her blood Filed Vitals:   07/01/14 1300 07/01/14 2100 07/02/14 0444 07/02/14 1009  BP: 123/59 116/60 117/60 126/50  Pulse: 66 64 65 64  Temp: 98 F (36.7 C) 98.4 F (36.9 C) 98.9 F (37.2 C)   TempSrc: Oral Oral Oral   Resp: 18 18 16    Height:      Weight:   186 lb 9.6 oz (84.641 kg)   SpO2: 94% 96% 98%     Intake/Output Summary (Last 24 hours) at 07/02/14 1023 Last data filed at 07/02/14 1014  Gross per 24 hour  Intake   1312 ml  Output   2475 ml  Net  -1163 ml   LABS: Basic Metabolic Panel:  Recent Labs  07/01/14 0554 07/02/14 0525  NA 135 136  K 2.8* 3.8  CL 91* 92*  CO2 38* 35*  GLUCOSE 112* 153*  BUN 38* 39*  CREATININE 2.54* 2.60*  CALCIUM 9.0 9.2   CBC:  Recent Labs  06/30/14 1140  WBC 5.2  HGB 11.2*  HCT 33.8*  MCV 100.9*  PLT 178     Radiology/Studies:  No new data  Physical Exam: Blood pressure 126/50, pulse 64, temperature 98.9 F (37.2 C), temperature source Oral, resp. rate 16, height 5\' 7"  (1.702 m), weight 186 lb 9.6 oz (84.641 kg), SpO2 98 %. Weight change: -2 lb 15.9 oz (-1.359 kg)  Wt Readings from Last 3 Encounters:  07/02/14 186 lb 9.6 oz (84.641 kg)  06/16/14 188 lb 1.6 oz (85.322 kg)  06/08/14 186 lb 1.6 oz (84.414 kg)   Neck veins are flat Decreased skin turgor Decreased breath sounds at the bases bilaterally, but without rales. Regular rhythm without murmur or rub No peripheral edema  ASSESSMENT:  1. Acute on chronic diastolic heart failure now with nearly 9 L diuresis over the past week. Exam does not reveal any significant evidence of volume overload 2. Acute on chronic kidney injury with creatinine bumping up to 2.6 today. Stage IV chronic kidney disease his underlying  substrate 3. No recurrence of paroxysmal atrial fibrillation 4. Essential hypertension is well controlled  Plan:  1. We will try oral diuretic therapy today in the form of metolazone 5 mg followed 30-60 minutes later by edema and ex 100 mg. 2. A basic metabolic panel will be obtained in the a.m. 3. He will be eligible for discharge in the a.m. the only decision in the a.m. will be the dose of discharge diuretics. I am guessing that we will send him home on metolazone 2.5 mg 3 or 4 times per week and Demadex 100 mg daily. 4. He may need low dose supplemental potassium therapy depending upon tomorrow's blood work 5. He should follow-up with me in the office on Wednesday or Thursday of next week with a BMET. 6. Imdur has been discontinued.  [name] 07/02/2014, 10:23 AM

## 2014-07-02 NOTE — Discharge Summary (Signed)
Physician Discharge Summary  Patient ID: [name] MRN: [id number] DOB/AGE: 1918-06-15 81 y.o.   Primary Cardiologist: [name]   Admit date: 06/22/2014 Discharge date: 07/03/2014  Admission Diagnoses: Acute on Chronic Diastolic CHF. Discharge weight 183 lbs.  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 BNP was elevated at 447. Admission weight ws 196 lb, 7 lbs over his dry weight of 189 lb. Troponins were cycled x 3 and were minimally elevated with flat trend. He denied CP. Minimal troponin elevation was felt to be subsequent to demand ischemia in the setting of a/c CHF exacerbation. No ischemic w/u was indicated.   For his CHF, he was treated with IV diuretic therapy. He had improvement in respiratory function with diuresis, however he developed acute on chronic kidney failure. SCr spiked to 2.82, but later improved back down to 2.5. He was transitioned back to PO diuretics on 06/24/14 and his weight increased back to admission weight and his creatanine went back up. He was placed back on IV diuretics. He was then transitioned back to PO diuretics (demedex and torsemide on 07/02/14) and has done well. Net 10 liter negative this admission. Creat stable ~ 2.59. Plan discharge today. Discharge diuretic regimen should be Demadex 80 mg each AM; Metolazone 2.5 mg on M,  W, F 30-60 minutes before demadex; Kdur 20 meq qam. His discharge weight was 183 lbs.  A staff message has been sent to the office to have an office visit and BMET set up with [name] the next week.    Consults: None  Treatments: See Hospital Course  Discharge Exam: Blood pressure 142/52, pulse 63, temperature 98.8 F (37.1 C), temperature source Oral, resp. rate 18, height 5\' 7"  (1.702 m), weight 183 lb 11.2 oz (83.326 kg), SpO2 98 %.   Disposition: 01-Home or Self Care     Medication List    TAKE these medications        allopurinol 100 MG tablet  Commonly known as:  ZYLOPRIM  Take 100 mg by mouth 2 (two) times daily.     amiodarone 200 MG tablet  Commonly known as:  PACERONE  Take 1 tablet (200 mg total) by mouth at bedtime.     apixaban 2.5 MG Tabs tablet  Commonly known as:  ELIQUIS  Take 1 tablet (2.5 mg total) by mouth 2 (two) times daily.     BL FLAX SEED OIL PO  Take 1 capsule by mouth 2 (two) times daily.     dutasteride 0.5 MG capsule  Commonly known as:  AVODART  Take 0.5 mg by mouth daily.     ferrous sulfate 325 (65 FE) MG tablet  Take 325 mg by mouth every other day.     hydrALAZINE 25 MG tablet  Commonly known as:  APRESOLINE  Take 25 mg by mouth every evening.  insulin glargine 100 UNIT/ML injection  Commonly known as:  LANTUS  Inject 9-10 Units into the skin at bedtime.     isosorbide mononitrate 60 MG 24 hr tablet  Commonly known as:  IMDUR  Take 60 mg by mouth daily.     metolazone 2.5 MG tablet  Commonly known as:  ZAROXOLYN  Take 1 tablet (2.5 mg total) by mouth every Monday, Wednesday, and Friday.     metoprolol tartrate 25 MG tablet  Commonly known as:  LOPRESSOR  Take 1 tablet (25 mg total) by mouth daily.     multivitamin with minerals Tabs tablet  Take 1 tablet by mouth daily. Diabetic pak by Petra Kuba from [name]     nitroGLYCERIN 0.4 MG SL tablet  Commonly known as:  NITROSTAT  Place 1 tablet (0.4 mg total) under the  tongue every 5 (five) minutes as needed for chest pain.     NOVOLOG FLEXPEN 100 UNIT/ML FlexPen  Generic drug:  insulin aspart  - Inject 2-9 Units into the skin 3 (three) times daily with meals. Sliding scale  - If at lunch gbc is between 159 do not give any. If 299 or above give 3 units. At bedtime it is always 9 units     polyethylene glycol packet  Commonly known as:  MIRALAX / GLYCOLAX  Take 17 g by mouth 2 (two) times daily as needed for mild constipation. For constipation     potassium chloride SA 20 MEQ tablet  Commonly known as:  K-DUR,KLOR-CON  Take 20 mEq by mouth daily.     REFRESH OP  Place 1 drop into both eyes 2 (two) times daily. Refresh Gel Opth     silodosin 8 MG Caps capsule  Commonly known as:  RAPAFLO  Take 8 mg by mouth daily with breakfast.     tamsulosin 0.4 MG Caps capsule  Commonly known as:  FLOMAX  Take 0.8 mg by mouth at bedtime.     torsemide 20 MG tablet  Commonly known as:  DEMADEX  Take 4 tablets (80 mg total) by mouth daily.     ZIOPTAN 0.0015 % Soln  Generic drug:  Tafluprost  Place 1 drop into both eyes at bedtime. Use at night       Follow-up Information    Follow up with [name], MD.   Specialty:  Cardiology   Why:  The office will call you to make an appoinment., If you do not hear from them, please contact them., You should be seen next week   Contact information:   [address] [place] [address] [phone]     TIME SPENT ON DISCHARGE INCLUDING PHYSICIAN TIME: >30 MINTUES  Signed: Eileen Stanford 07/03/2014, 8:56 AM

## 2014-07-03 ENCOUNTER — Other Ambulatory Visit: Payer: Self-pay | Admitting: Physician Assistant

## 2014-07-03 DIAGNOSIS — I5032 Chronic diastolic (congestive) heart failure: Secondary | ICD-10-CM

## 2014-07-03 LAB — BASIC METABOLIC PANEL
ANION GAP: 9 (ref 5–15)
BUN: 39 mg/dL — AB (ref 6–23)
CALCIUM: 9.4 mg/dL (ref 8.4–10.5)
CHLORIDE: 90 mmol/L — AB (ref 96–112)
CO2: 37 mmol/L — ABNORMAL HIGH (ref 19–32)
Creatinine, Ser: 2.59 mg/dL — ABNORMAL HIGH (ref 0.50–1.35)
GFR calc Af Amer: 23 mL/min — ABNORMAL LOW (ref >90)
GFR calc non Af Amer: 20 mL/min — ABNORMAL LOW (ref >90)
Glucose, Bld: 177 mg/dL — ABNORMAL HIGH (ref 70–99)
Potassium: 3.1 mmol/L — ABNORMAL LOW (ref 3.5–5.1)
Sodium: 136 mmol/L (ref 135–145)

## 2014-07-03 LAB — GLUCOSE, CAPILLARY: GLUCOSE-CAPILLARY: 172 mg/dL — AB (ref 70–99)

## 2014-07-03 MED ORDER — TORSEMIDE 100 MG PO TABS: 100.0000 mg | ORAL_TABLET | Freq: Every day | ORAL | Status: DC

## 2014-07-03 MED ORDER — METOLAZONE 2.5 MG PO TABS: 2.5000 mg | ORAL_TABLET | ORAL | Status: DC

## 2014-07-03 MED ORDER — TORSEMIDE 20 MG PO TABS: 80.0000 mg | ORAL_TABLET | Freq: Every day | ORAL | Status: DC

## 2014-07-03 MED ORDER — POTASSIUM CHLORIDE CRYS ER 20 MEQ PO TBCR: 40.0000 meq | EXTENDED_RELEASE_TABLET | Freq: Two times a day (BID) | ORAL | Status: DC

## 2014-07-03 MED ORDER — POTASSIUM CHLORIDE CRYS ER 20 MEQ PO TBCR
40.0000 meq | EXTENDED_RELEASE_TABLET | Freq: Once | ORAL | Status: AC
  Administered 2014-07-03: 40 meq via ORAL
  Filled 2014-07-03: qty 2

## 2014-07-03 MED ORDER — METOLAZONE 2.5 MG PO TABS
2.5000 mg | ORAL_TABLET | Freq: Once | ORAL | Status: AC
  Administered 2014-07-03: 2.5 mg via ORAL
  Filled 2014-07-03 (×3): qty 1

## 2014-07-03 MED ORDER — TORSEMIDE 20 MG PO TABS: ORAL_TABLET | ORAL | Status: DC

## 2014-07-03 NOTE — Discharge Instructions (Signed)

## 2014-07-03 NOTE — Progress Notes (Signed)
DC IV, DC Tele, DC Home. Discharge instructions and home medications discussed with patient and patient's caregiver. Patient and caregiver denied any questions or concerns at this time. Patient leaving unit via wheelchair along with foley catheter and appears in no acute distress.

## 2014-07-03 NOTE — Progress Notes (Signed)
Net 10 liter negative this admission. Plan discharge today. Discharge diuretic regimen should be Demadex 80 mg each AM; Metolazone 2.5 mg on M, W, F 30-60 minutes before demadex; K dur 20 meq qam. Needs oV with me this coming week with BMET.

## 2014-07-06 ENCOUNTER — Telehealth: Payer: Self-pay

## 2014-07-06 NOTE — Telephone Encounter (Signed)
Pt aware of post hosp f/u with Dr.Smith on 3/1 @ 9am

## 2014-07-13 ENCOUNTER — Ambulatory Visit (INDEPENDENT_AMBULATORY_CARE_PROVIDER_SITE_OTHER): Payer: Medicare Other | Admitting: Interventional Cardiology

## 2014-07-13 ENCOUNTER — Other Ambulatory Visit (HOSPITAL_COMMUNITY)
Admission: RE | Admit: 2014-07-13 | Discharge: 2014-07-13 | Disposition: A | Discharge status: Home / Self Care | Payer: Medicare Other | Source: Other Acute Inpatient Hospital | Attending: Interventional Cardiology | Admitting: Interventional Cardiology

## 2014-07-13 ENCOUNTER — Encounter: Payer: Self-pay | Admitting: Interventional Cardiology

## 2014-07-13 ENCOUNTER — Telehealth: Payer: Self-pay

## 2014-07-13 ENCOUNTER — Other Ambulatory Visit: Payer: Self-pay | Admitting: Cardiology

## 2014-07-13 VITALS — BP 104/64 | HR 68 | Ht 67.0 in | Wt 180.2 lb

## 2014-07-13 DIAGNOSIS — I5032 Chronic diastolic (congestive) heart failure: Secondary | ICD-10-CM | POA: Insufficient documentation

## 2014-07-13 DIAGNOSIS — I10 Essential (primary) hypertension: Secondary | ICD-10-CM

## 2014-07-13 DIAGNOSIS — I480 Paroxysmal atrial fibrillation: Secondary | ICD-10-CM

## 2014-07-13 DIAGNOSIS — I208 Other forms of angina pectoris: Secondary | ICD-10-CM

## 2014-07-13 DIAGNOSIS — N184 Chronic kidney disease, stage 4 (severe): Secondary | ICD-10-CM

## 2014-07-13 LAB — LACTIC ACID, PLASMA
LACTIC ACID, VENOUS: 2.4 mmol/L — AB (ref 0.5–2.0)
LACTIC ACID: 2.4 mmol/L — ABNORMAL HIGH (ref 0.5–2.2)

## 2014-07-13 LAB — BASIC METABOLIC PANEL
BUN: 62 mg/dL — AB (ref 6–23)
CALCIUM: 9.6 mg/dL (ref 8.4–10.5)
CO2: 42 mEq/L — ABNORMAL HIGH (ref 19–32)
CREATININE: 2.96 mg/dL — AB (ref 0.40–1.50)
Chloride: 85 mEq/L — ABNORMAL LOW (ref 96–112)
GFR: 25.58 mL/min — AB (ref >60.00)
Glucose, Bld: 310 mg/dL — ABNORMAL HIGH (ref 70–99)
Potassium: 3.2 mEq/L — ABNORMAL LOW (ref 3.5–5.1)
Sodium: 131 mEq/L — ABNORMAL LOW (ref 135–145)

## 2014-07-13 NOTE — Patient Instructions (Signed)
Your physician recommends that you continue on your current medications as directed. Please refer to the Current Medication list given to you today.  Lab Today: Bmet, Lactic acid  Your physician recommends that you schedule a follow-up appointment on 07/26/14 @3pm 

## 2014-07-13 NOTE — Telephone Encounter (Signed)
-----   Message from [name], MD sent at 07/13/2014  2:33 PM EST ----- Discontinue metolazone. Potassium 20 mEq twice daily for 4 doses then back to 20 mEq daily. I will use metolazone only when the weight is above 18\5 pounds. Skip Demadex on Wednesday morning but then resume on Thursday 80 mg per day

## 2014-07-13 NOTE — Progress Notes (Signed)
Cardiology Office Note   Date:  07/13/2014   ID:  [name], DOB 10/27/1918, MRN [id number]  PCP:  [name], MD  Cardiologist:   [name], MD   Chief Complaint  Patient presents with  . Hospitalization Follow-up    leg weakness.    ? about Imdur dosage      History of Present Illness: [name] is a 81 y.o. male who presents for follow-up of chronic diastolic heart failure. He is doing well. He is maintaining a weight around 180 pounds. He denies dyspnea chest pain. He is upset because we refused to approve [place] food on his birthday. He has got no over that period    Past Medical History  Diagnosis Date  . Hyperlipidemia   . Coronary artery disease   . Spinal stenosis   . Pneumonia   . Anemia   . Carotid artery disease   . PVD (peripheral vascular disease)   . Hyponatremia     Chronic  . CVA (cerebral infarction)   . CKD (chronic kidney disease) stage 4, GFR 15-29 ml/min   . Long term (current) use of anticoagulants     No bleeding on Eliquis  . PAF (paroxysmal atrial fibrillation)     Asymptomatic. On Eliquis.  . Sick sinus syndrome     With DDD St. Jude PM, initially placed in 1993 by Dr. Nils Pyle. Device upgrade 10/2002 to DDD, by Dr. Rollene Fare complicated by bleeding.  [name] Hypertensive cardiovascular disease   . CHF (congestive heart failure)   . Chronic diastolic congestive heart failure     ECHO 05/20/12 LVEF estimated by 2D at 55-60%  . Arthritis   . Shortness of breath   . Gait disorder 03/31/2014  . Diabetic peripheral neuropathy associated with type 2 diabetes mellitus 03/31/2014  . Diabetes mellitus     insulin dependent  . Elevated troponin I level 06/22/2014  . Chronic anticoagulation 04/03/2013    For PAF, on Eliquis     Past Surgical History  Procedure Laterality Date  . Lumbar laminectomy  1995  . Back surgery    . Total knee arthroplasty Left   . Quadriceps tendon repair    . Cataract extraction    . Carotid  endarterectomy    . Yag laser application Right 05/23/2109    Procedure: YAG LASER CAPSULOTOMY OF RIGHT EYE;  Surgeon: [name], MD;  Location: [facility];  Service: Ophthalmology;  Laterality: Right;  . Tonsillectomy    . Pacemaker insertion      DDD St. Jude PM, initially placed in 1993 by Dr. Nils Pyle. Device upgrade 10/2002 to DDD, by Dr. Rollene Fare complicated by bleeding.  . Carotid endarterectomy Right 2006  . Transurethral resection of prostate    . Cardioversion N/A 06/16/2013    Procedure: CARDIOVERSION BEDSIDE;  Surgeon: [name], MD;  Location: [facility];  Service: Cardiovascular;  Laterality: N/A;  . Pacemaker generator change  12/09/2009    SJM Accent DR RF gen change by Dr [name]     Current Outpatient Prescriptions  Medication Sig Dispense Refill  . allopurinol (ZYLOPRIM) 100 MG tablet Take 100 mg by mouth 2 (two) times daily.     [name] amiodarone (PACERONE) 200 MG tablet Take 1 tablet (200 mg total) by mouth at bedtime.    [name] apixaban (ELIQUIS) 2.5 MG TABS tablet Take 1 tablet (2.5 mg total) by mouth 2 (two) times daily. 60 tablet 5  . dutasteride (AVODART)  0.5 MG capsule Take 0.5 mg by mouth daily.     . ferrous sulfate 325 (65 FE) MG tablet Take 325 mg by mouth every other day.     . insulin aspart (NOVOLOG FLEXPEN) 100 UNIT/ML FlexPen Inject 2-9 Units into the skin 3 (three) times daily with meals. Sliding scale If at lunch gbc is between 159 do not give any. If 299 or above give 3 units. At bedtime it is always 9 units    . insulin glargine (LANTUS) 100 UNIT/ML injection Inject 9-10 Units into the skin at bedtime.     . isosorbide mononitrate (IMDUR) 60 MG 24 hr tablet Take 60 mg by mouth daily.     . metolazone (ZAROXOLYN) 2.5 MG tablet Take 1 tablet (2.5 mg total) by mouth every Monday, Wednesday, and Friday. 30 tablet 11  . metoprolol tartrate (LOPRESSOR) 25 MG tablet Take 1 tablet (25 mg total) by mouth daily. 30 tablet 6  . Multiple Vitamin (MULTIVITAMIN WITH  MINERALS) TABS Take 1 tablet by mouth daily. Diabetic pak by Petra Kuba from [employer]    . nitroGLYCERIN (NITROSTAT) 0.4 MG SL tablet Place 1 tablet (0.4 mg total) under the tongue every 5 (five) minutes as needed for chest pain. 25 tablet 3  . polyethylene glycol (MIRALAX / GLYCOLAX) packet Take 17 g by mouth 2 (two) times daily as needed for mild constipation. For constipation    . Polyvinyl Alcohol-Povidone (REFRESH OP) Place 1 drop into both eyes 2 (two) times daily. Refresh Gel Opth    . potassium chloride SA (K-DUR,KLOR-CON) 20 MEQ tablet Take 20 mEq by mouth daily.     . Tafluprost (ZIOPTAN) 0.0015 % SOLN Place 1 drop into both eyes at bedtime. Use at night    . tamsulosin (FLOMAX) 0.4 MG CAPS capsule Take 0.8 mg by mouth at bedtime.    . torsemide (DEMADEX) 20 MG tablet Take 4 tablets (80 mg total) by mouth daily. 120 tablet 3   No current facility-administered medications for this visit.    Allergies:   Statins; Aggrenox; Carvedilol; Claritin; Mucinex; Plavix; Rapaflo; and Travatan z    Social History:  The patient  reports that he quit smoking about 56 years ago. His smoking use included Pipe. He has never used smokeless tobacco. He reports that he does not drink alcohol or use illicit drugs.   Family History:  The patient's family history includes Hypertension in his mother; Multiple myeloma in his father.    ROS:  Please see the history of present illness.   Otherwise, review of systems are positive for none.   All other systems are reviewed and negative.    PHYSICAL EXAM: VS:  BP 104/64 mmHg  Pulse 68  Ht _0  (1.702 m)  Wt 180 lb 3.2 oz (81.738 kg)  BMI 28.22 kg/m2  SpO2 99% , BMI Body mass index is 28.22 kg/(m^2). GEN: Well nourished, well developed, in no acute distress HEENT: normal Neck: no JVD, carotid bruits, or masses Cardiac: RRR; no murmurs, rubs, or gallops. Left greater than right leg edema  Respiratory:  clear to auscultation bilaterally, normal work of  breathing GI: soft, nontender, nondistended, + BS MS: no deformity or atrophy Skin: warm and dry, no rash Neuro:  Strength and sensation are intact Psych: euthymic mood, full affect   EKG:  EKG is not ordered today.    Recent Labs: 04/04/2014: Pro B Natriuretic peptide (BNP) 4874.0* 06/22/2014: ALT 38; B Natriuretic Peptide 447.7*; Magnesium 2.4 06/30/2014: Hemoglobin 11.2*; Platelets  178 07/13/2014: BUN 62*; Creatinine 2.96*; Potassium 3.2*; Sodium 131*    Lipid Panel No results found for: CHOL, TRIG, HDL, CHOLHDL, VLDL, LDLCALC, LDLDIRECT    Wt Readings from Last 3 Encounters:  07/13/14 180 lb 3.2 oz (81.738 kg)  07/03/14 183 lb 11.2 oz (83.326 kg)  06/16/14 188 lb 1.6 oz (85.322 kg)      Other studies Reviewed: Additional studies/ records that were reviewed today include: .   ASSESSMENT AND PLAN:  1.  Chronic diastolic heart failure without evidence of fluid overload 2. Chronic kidney disease, stage III-IV 3. Systemic arterial hypertension, controlled 4. Paroxysmal atrial fibrillation   Current medicines are reviewed at length with the patient today.  The patient does not have concerns regarding medicines.  The following changes have been made:  None  Labs/ tests ordered today include:   Orders Placed This Encounter  Procedures  . Basic metabolic panel  . Lactic acid, plasma     Disposition:   FU with Linard Millers in 1 week   Signed, [name], MD  07/13/2014 1:30 PM    [facility] [address], [place], [place]  [address] Phone: [phone]; Fax: [phone]

## 2014-07-13 NOTE — Telephone Encounter (Signed)
called to give pt lab results and Dr.Smith's recommedntaion.lmtcb

## 2014-07-14 ENCOUNTER — Other Ambulatory Visit: Payer: Self-pay | Admitting: *Deleted

## 2014-07-14 MED ORDER — ISOSORBIDE MONONITRATE ER 60 MG PO TB24: 60.0000 mg | ORAL_TABLET | Freq: Every day | ORAL | Status: DC

## 2014-07-15 ENCOUNTER — Other Ambulatory Visit: Payer: Self-pay | Admitting: Cardiology

## 2014-07-15 NOTE — Telephone Encounter (Signed)
Pt aware.

## 2014-07-15 NOTE — Telephone Encounter (Signed)
-----   Message from [name], MD sent at 07/13/2014  2:33 PM EST ----- Discontinue metolazone. Potassium 20 mEq twice daily for 4 doses then back to 20 mEq daily. I will use metolazone only when the weight is above 18\5 pounds. Skip Demadex on Wednesday morning but then resume on Thursday 80 mg per day

## 2014-07-16 ENCOUNTER — Other Ambulatory Visit (INDEPENDENT_AMBULATORY_CARE_PROVIDER_SITE_OTHER): Payer: Medicare Other | Admitting: *Deleted

## 2014-07-16 ENCOUNTER — Encounter (HOSPITAL_COMMUNITY): Payer: Self-pay | Admitting: Family Medicine

## 2014-07-16 ENCOUNTER — Telehealth: Payer: Self-pay

## 2014-07-16 ENCOUNTER — Inpatient Hospital Stay (HOSPITAL_COMMUNITY)
Admission: EM | Admit: 2014-07-16 | Discharge: 2014-07-19 | DRG: 291 | Disposition: A | Discharge status: Home Health | Payer: Medicare Other | Attending: Internal Medicine | Admitting: Internal Medicine

## 2014-07-16 ENCOUNTER — Emergency Department (HOSPITAL_COMMUNITY): Payer: Medicare Other

## 2014-07-16 ENCOUNTER — Other Ambulatory Visit (HOSPITAL_COMMUNITY): Payer: Self-pay

## 2014-07-16 DIAGNOSIS — R531 Weakness: Secondary | ICD-10-CM

## 2014-07-16 DIAGNOSIS — R29898 Other symptoms and signs involving the musculoskeletal system: Secondary | ICD-10-CM | POA: Diagnosis not present

## 2014-07-16 DIAGNOSIS — Z7901 Long term (current) use of anticoagulants: Secondary | ICD-10-CM

## 2014-07-16 DIAGNOSIS — Z87891 Personal history of nicotine dependence: Secondary | ICD-10-CM | POA: Diagnosis not present

## 2014-07-16 DIAGNOSIS — Z95810 Presence of automatic (implantable) cardiac defibrillator: Secondary | ICD-10-CM | POA: Diagnosis not present

## 2014-07-16 DIAGNOSIS — I272 Other secondary pulmonary hypertension: Secondary | ICD-10-CM | POA: Diagnosis present

## 2014-07-16 DIAGNOSIS — I480 Paroxysmal atrial fibrillation: Secondary | ICD-10-CM | POA: Diagnosis present

## 2014-07-16 DIAGNOSIS — I739 Peripheral vascular disease, unspecified: Secondary | ICD-10-CM | POA: Diagnosis present

## 2014-07-16 DIAGNOSIS — I130 Hypertensive heart and chronic kidney disease with heart failure and stage 1 through stage 4 chronic kidney disease, or unspecified chronic kidney disease: Secondary | ICD-10-CM | POA: Diagnosis present

## 2014-07-16 DIAGNOSIS — I10 Essential (primary) hypertension: Secondary | ICD-10-CM

## 2014-07-16 DIAGNOSIS — I2510 Atherosclerotic heart disease of native coronary artery without angina pectoris: Secondary | ICD-10-CM | POA: Diagnosis present

## 2014-07-16 DIAGNOSIS — Z794 Long term (current) use of insulin: Secondary | ICD-10-CM

## 2014-07-16 DIAGNOSIS — N184 Chronic kidney disease, stage 4 (severe): Secondary | ICD-10-CM | POA: Diagnosis not present

## 2014-07-16 DIAGNOSIS — N19 Unspecified kidney failure: Secondary | ICD-10-CM

## 2014-07-16 DIAGNOSIS — I509 Heart failure, unspecified: Secondary | ICD-10-CM | POA: Diagnosis not present

## 2014-07-16 DIAGNOSIS — I5043 Acute on chronic combined systolic (congestive) and diastolic (congestive) heart failure: Secondary | ICD-10-CM | POA: Diagnosis present

## 2014-07-16 DIAGNOSIS — Z8673 Personal history of transient ischemic attack (TIA), and cerebral infarction without residual deficits: Secondary | ICD-10-CM

## 2014-07-16 DIAGNOSIS — R0602 Shortness of breath: Secondary | ICD-10-CM

## 2014-07-16 DIAGNOSIS — E785 Hyperlipidemia, unspecified: Secondary | ICD-10-CM | POA: Diagnosis present

## 2014-07-16 DIAGNOSIS — K7460 Unspecified cirrhosis of liver: Secondary | ICD-10-CM | POA: Diagnosis present

## 2014-07-16 DIAGNOSIS — Z96652 Presence of left artificial knee joint: Secondary | ICD-10-CM | POA: Diagnosis present

## 2014-07-16 DIAGNOSIS — D649 Anemia, unspecified: Secondary | ICD-10-CM | POA: Diagnosis present

## 2014-07-16 DIAGNOSIS — E1142 Type 2 diabetes mellitus with diabetic polyneuropathy: Secondary | ICD-10-CM | POA: Diagnosis present

## 2014-07-16 DIAGNOSIS — E1165 Type 2 diabetes mellitus with hyperglycemia: Secondary | ICD-10-CM | POA: Diagnosis present

## 2014-07-16 DIAGNOSIS — I5032 Chronic diastolic (congestive) heart failure: Secondary | ICD-10-CM

## 2014-07-16 DIAGNOSIS — K7469 Other cirrhosis of liver: Secondary | ICD-10-CM | POA: Diagnosis not present

## 2014-07-16 DIAGNOSIS — R279 Unspecified lack of coordination: Secondary | ICD-10-CM

## 2014-07-16 DIAGNOSIS — E871 Hypo-osmolality and hyponatremia: Secondary | ICD-10-CM | POA: Diagnosis present

## 2014-07-16 DIAGNOSIS — N179 Acute kidney failure, unspecified: Secondary | ICD-10-CM | POA: Diagnosis present

## 2014-07-16 DIAGNOSIS — I110 Hypertensive heart disease with heart failure: Secondary | ICD-10-CM

## 2014-07-16 DIAGNOSIS — R0600 Dyspnea, unspecified: Secondary | ICD-10-CM

## 2014-07-16 HISTORY — DX: Pulmonary hypertension, unspecified: I27.20

## 2014-07-16 HISTORY — DX: Retention of urine, unspecified: R33.9

## 2014-07-16 LAB — BRAIN NATRIURETIC PEPTIDE
B Natriuretic Peptide: 506.6 pg/mL — ABNORMAL HIGH (ref 0.0–100.0)
PRO B NATRI PEPTIDE: 460 pg/mL — AB (ref 0.0–100.0)

## 2014-07-16 LAB — CBC WITH DIFFERENTIAL/PLATELET
BASOS ABS: 0 10*3/uL (ref 0.0–0.1)
Basophils Relative: 0.2 % (ref 0.0–3.0)
EOS ABS: 0 10*3/uL (ref 0.0–0.7)
Eosinophils Relative: 0 % (ref 0.0–5.0)
HCT: 34.6 % — ABNORMAL LOW (ref 39.0–52.0)
Hemoglobin: 11.5 g/dL — ABNORMAL LOW (ref 13.0–17.0)
LYMPHS ABS: 1.2 10*3/uL (ref 0.7–4.0)
Lymphocytes Relative: 15.6 % (ref 12.0–46.0)
MCHC: 33.2 g/dL (ref 30.0–36.0)
MCV: 99.3 fl (ref 78.0–100.0)
MONOS PCT: 10.5 % (ref 3.0–12.0)
Monocytes Absolute: 0.8 10*3/uL (ref 0.1–1.0)
Neutro Abs: 5.4 10*3/uL (ref 1.4–7.7)
Neutrophils Relative %: 73.7 % (ref 43.0–77.0)
PLATELETS: 190 10*3/uL (ref 150.0–400.0)
RBC: 3.48 Mil/uL — ABNORMAL LOW (ref 4.22–5.81)
RDW: 16 % — AB (ref 11.5–15.5)
WBC: 7.4 10*3/uL (ref 4.0–10.5)

## 2014-07-16 LAB — CBC
HEMATOCRIT: 35.4 % — AB (ref 39.0–52.0)
Hemoglobin: 11.8 g/dL — ABNORMAL LOW (ref 13.0–17.0)
MCH: 33.2 pg (ref 26.0–34.0)
MCHC: 33.3 g/dL (ref 30.0–36.0)
MCV: 99.7 fL (ref 78.0–100.0)
PLATELETS: 214 10*3/uL (ref 150–400)
RBC: 3.55 MIL/uL — AB (ref 4.22–5.81)
RDW: 15.1 % (ref 11.5–15.5)
WBC: 7.6 10*3/uL (ref 4.0–10.5)

## 2014-07-16 LAB — I-STAT TROPONIN, ED: TROPONIN I, POC: 0.25 ng/mL — AB (ref 0.00–0.08)

## 2014-07-16 LAB — AMMONIA: Ammonia: 83 umol/L — ABNORMAL HIGH (ref 11–32)

## 2014-07-16 LAB — CK TOTAL AND CKMB (NOT AT ARMC)
CK, MB: 2.6 ng/mL (ref 0.3–4.0)
Relative Index: 1.9 (ref 0.0–2.5)
Total CK: 137 U/L (ref 7–232)

## 2014-07-16 LAB — GLUCOSE, CAPILLARY: Glucose-Capillary: 225 mg/dL — ABNORMAL HIGH (ref 70–99)

## 2014-07-16 LAB — BASIC METABOLIC PANEL
ANION GAP: 13 (ref 5–15)
BUN: 67 mg/dL — ABNORMAL HIGH (ref 6–23)
BUN: 62 mg/dL — ABNORMAL HIGH (ref 6–23)
CO2: 37 meq/L — AB (ref 19–32)
CO2: 32 mmol/L (ref 19–32)
CREATININE: 3.08 mg/dL — AB (ref 0.40–1.50)
CREATININE: 3.06 mg/dL — AB (ref 0.50–1.35)
Calcium: 9.6 mg/dL (ref 8.4–10.5)
Calcium: 9.4 mg/dL (ref 8.4–10.5)
Chloride: 87 mEq/L — ABNORMAL LOW (ref 96–112)
Chloride: 87 mmol/L — ABNORMAL LOW (ref 96–112)
GFR calc Af Amer: 19 mL/min — ABNORMAL LOW (ref >90)
GFR calc non Af Amer: 16 mL/min — ABNORMAL LOW (ref >90)
GFR: 24.43 mL/min — ABNORMAL LOW (ref >60.00)
Glucose, Bld: 272 mg/dL — ABNORMAL HIGH (ref 70–99)
Glucose, Bld: 284 mg/dL — ABNORMAL HIGH (ref 70–99)
Potassium: 4.2 mEq/L (ref 3.5–5.1)
Potassium: 4.4 mmol/L (ref 3.5–5.1)
SODIUM: 130 meq/L — AB (ref 135–145)
SODIUM: 132 mmol/L — AB (ref 135–145)

## 2014-07-16 LAB — TSH: TSH: 1.05 u[IU]/mL (ref 0.350–4.500)

## 2014-07-16 LAB — TROPONIN I: Troponin I: 0.19 ng/mL — ABNORMAL HIGH (ref <0.031)

## 2014-07-16 LAB — MRSA PCR SCREENING: MRSA by PCR: NEGATIVE

## 2014-07-16 LAB — SEDIMENTATION RATE: Sed Rate: 53 mm/hr — ABNORMAL HIGH (ref 0–16)

## 2014-07-16 MED ORDER — TORSEMIDE 20 MG PO TABS
80.0000 mg | ORAL_TABLET | Freq: Every day | ORAL | Status: DC
  Administered 2014-07-17 – 2014-07-19 (×3): 80 mg via ORAL
  Filled 2014-07-16 (×4): qty 4

## 2014-07-16 MED ORDER — TAMSULOSIN HCL 0.4 MG PO CAPS
0.8000 mg | ORAL_CAPSULE | Freq: Every day | ORAL | Status: DC
  Administered 2014-07-16 – 2014-07-18 (×3): 0.8 mg via ORAL
  Filled 2014-07-16 (×5): qty 2

## 2014-07-16 MED ORDER — SODIUM CHLORIDE 0.9 % IJ SOLN: 3.0000 mL | INTRAMUSCULAR | Status: DC | PRN

## 2014-07-16 MED ORDER — NITROGLYCERIN 0.4 MG SL SUBL: 0.4000 mg | SUBLINGUAL_TABLET | SUBLINGUAL | Status: DC | PRN

## 2014-07-16 MED ORDER — ISOSORBIDE MONONITRATE ER 60 MG PO TB24
60.0000 mg | ORAL_TABLET | Freq: Every day | ORAL | Status: DC
  Administered 2014-07-17 – 2014-07-19 (×3): 60 mg via ORAL
  Filled 2014-07-16 (×4): qty 1

## 2014-07-16 MED ORDER — FERROUS SULFATE 325 (65 FE) MG PO TABS
325.0000 mg | ORAL_TABLET | ORAL | Status: DC
  Administered 2014-07-18: 325 mg via ORAL
  Filled 2014-07-16 (×2): qty 1

## 2014-07-16 MED ORDER — ACETAMINOPHEN 325 MG PO TABS
650.0000 mg | ORAL_TABLET | Freq: Four times a day (QID) | ORAL | Status: DC | PRN
  Filled 2014-07-16: qty 2

## 2014-07-16 MED ORDER — METOPROLOL TARTRATE 25 MG PO TABS
25.0000 mg | ORAL_TABLET | Freq: Every day | ORAL | Status: DC
  Administered 2014-07-17 – 2014-07-18 (×2): 25 mg via ORAL
  Filled 2014-07-16 (×3): qty 1

## 2014-07-16 MED ORDER — AMIODARONE HCL 200 MG PO TABS
200.0000 mg | ORAL_TABLET | Freq: Every day | ORAL | Status: DC
  Administered 2014-07-16 – 2014-07-18 (×3): 200 mg via ORAL
  Filled 2014-07-16 (×5): qty 1

## 2014-07-16 MED ORDER — APIXABAN 2.5 MG PO TABS
2.5000 mg | ORAL_TABLET | Freq: Two times a day (BID) | ORAL | Status: DC
  Administered 2014-07-16 – 2014-07-19 (×6): 2.5 mg via ORAL
  Filled 2014-07-16 (×7): qty 1

## 2014-07-16 MED ORDER — ADULT MULTIVITAMIN W/MINERALS CH
1.0000 | ORAL_TABLET | Freq: Every day | ORAL | Status: DC
  Administered 2014-07-17 – 2014-07-19 (×3): 1 via ORAL
  Filled 2014-07-16 (×4): qty 1

## 2014-07-16 MED ORDER — INSULIN ASPART 100 UNIT/ML ~~LOC~~ SOLN
0.0000 [IU] | Freq: Every day | SUBCUTANEOUS | Status: DC
  Administered 2014-07-16 – 2014-07-18 (×3): 2 [IU] via SUBCUTANEOUS

## 2014-07-16 MED ORDER — ALLOPURINOL 100 MG PO TABS
100.0000 mg | ORAL_TABLET | Freq: Two times a day (BID) | ORAL | Status: DC
  Administered 2014-07-16 – 2014-07-19 (×6): 100 mg via ORAL
  Filled 2014-07-16 (×7): qty 1

## 2014-07-16 MED ORDER — POTASSIUM CHLORIDE CRYS ER 20 MEQ PO TBCR
20.0000 meq | EXTENDED_RELEASE_TABLET | Freq: Every day | ORAL | Status: DC
  Administered 2014-07-16 – 2014-07-19 (×4): 20 meq via ORAL
  Filled 2014-07-16 (×5): qty 1

## 2014-07-16 MED ORDER — LATANOPROST 0.005 % OP SOLN
1.0000 [drp] | Freq: Every day | OPHTHALMIC | Status: DC
  Filled 2014-07-16: qty 2.5

## 2014-07-16 MED ORDER — BENZONATATE 100 MG PO CAPS
100.0000 mg | ORAL_CAPSULE | Freq: Two times a day (BID) | ORAL | Status: DC | PRN
  Administered 2014-07-17 – 2014-07-18 (×2): 100 mg via ORAL
  Filled 2014-07-16 (×2): qty 1

## 2014-07-16 MED ORDER — SODIUM CHLORIDE 0.9 % IJ SOLN
3.0000 mL | Freq: Two times a day (BID) | INTRAMUSCULAR | Status: DC
  Administered 2014-07-18 – 2014-07-19 (×2): 3 mL via INTRAVENOUS

## 2014-07-16 MED ORDER — INSULIN ASPART 100 UNIT/ML ~~LOC~~ SOLN
0.0000 [IU] | Freq: Three times a day (TID) | SUBCUTANEOUS | Status: DC
  Administered 2014-07-17 (×2): 2 [IU] via SUBCUTANEOUS
  Administered 2014-07-17: 3 [IU] via SUBCUTANEOUS
  Administered 2014-07-18 (×3): 2 [IU] via SUBCUTANEOUS
  Administered 2014-07-19: 1 [IU] via SUBCUTANEOUS

## 2014-07-16 MED ORDER — CARBOXYMETHYLCELLULOSE SODIUM 1 % OP SOLN: 1.0000 [drp] | Freq: Two times a day (BID) | OPHTHALMIC | Status: DC

## 2014-07-16 MED ORDER — DUTASTERIDE 0.5 MG PO CAPS
0.5000 mg | ORAL_CAPSULE | Freq: Every day | ORAL | Status: DC
  Administered 2014-07-17 – 2014-07-19 (×3): 0.5 mg via ORAL
  Filled 2014-07-16 (×4): qty 1

## 2014-07-16 MED ORDER — SODIUM CHLORIDE 0.9 % IJ SOLN
3.0000 mL | Freq: Two times a day (BID) | INTRAMUSCULAR | Status: DC
  Administered 2014-07-16 – 2014-07-18 (×5): 3 mL via INTRAVENOUS

## 2014-07-16 MED ORDER — POLYETHYLENE GLYCOL 3350 17 G PO PACK
17.0000 g | PACK | Freq: Two times a day (BID) | ORAL | Status: DC | PRN
  Administered 2014-07-17: 17 g via ORAL
  Filled 2014-07-16: qty 1

## 2014-07-16 MED ORDER — FUROSEMIDE 10 MG/ML IJ SOLN
40.0000 mg | INTRAMUSCULAR | Status: AC
  Administered 2014-07-16: 40 mg via INTRAVENOUS
  Filled 2014-07-16: qty 4

## 2014-07-16 MED ORDER — SODIUM CHLORIDE 0.9 % IV SOLN: 250.0000 mL | INTRAVENOUS | Status: DC | PRN

## 2014-07-16 MED ORDER — ACETAMINOPHEN 650 MG RE SUPP: 650.0000 mg | Freq: Four times a day (QID) | RECTAL | Status: DC | PRN

## 2014-07-16 MED ORDER — INSULIN GLARGINE 100 UNIT/ML ~~LOC~~ SOLN
9.0000 [IU] | Freq: Every day | SUBCUTANEOUS | Status: DC
  Administered 2014-07-16: 10 [IU] via SUBCUTANEOUS
  Filled 2014-07-16 (×2): qty 0.1

## 2014-07-16 MED ORDER — POLYVINYL ALCOHOL 1.4 % OP SOLN
1.0000 [drp] | Freq: Two times a day (BID) | OPHTHALMIC | Status: DC
  Administered 2014-07-19: 1 [drp] via OPHTHALMIC
  Filled 2014-07-16: qty 15

## 2014-07-16 NOTE — ED Notes (Signed)
Admission MD remains at bedside

## 2014-07-16 NOTE — H&P (Addendum)
[name] is an 81 y.o. male.    Jeanann Lewandowsky (pcp) Daneen Schick (cardiologist)  Chief Complaint: weakness HPI: 81 yo male with CHF (EF 55-60%), Dm2, Arthritis, CKD stage 4, apparently c/o increasein leg weakness and inability to walk over the past 2-3 days.  Pt recently had admission for CHF exacerbation.  Pt states that weight has been relative stable, and denies lower ext edema.  Pt notes that his legs ache, and denies sob beyond baseline or cp.  Cardiology has seen pt, please see recommendations. They doubt that elevation is trop is cardiac related per their notes.  ED requests admission for weakness, deconditioning, inability to walk and management of chf.   Past Medical History  Diagnosis Date  . Hyperlipidemia   . Spinal stenosis   . Anemia   . Carotid artery disease     a. s/p prior carotid endarterectomy.  [name] PVD (peripheral vascular disease)   . Hyponatremia     Chronic  . CVA (cerebral infarction)     a. When asked to clarify patient says he was told thumb numbness may be TIA. He does not recall formal stroke. CT head results are only available through [facility], not in [facility].  . CKD (chronic kidney disease) stage 4, GFR 15-29 ml/min   . Long term (current) use of anticoagulants   . PAF (paroxysmal atrial fibrillation)     a. Asymptomatic. On Eliquis. CHADSVASC 7.  . Sick sinus syndrome     With DDD St. Jude PM, initially placed in 1993 by Dr. Nils Pyle. Device upgrade 10/2002 to DDD, by Dr. Rollene Fare complicated by bleeding. Subsequent gen change 2011.  [name] Hypertensive cardiovascular disease   . Chronic diastolic congestive heart failure     ECHO 05/20/12 LVEF estimated by 2D at 55-60%  . Arthritis   . Gait disorder   . Diabetic peripheral neuropathy associated with type 2 diabetes mellitus   . Diabetes mellitus     insulin dependent  . Elevated troponin I level 06/22/2014    a. 06/2014 felt due to demand ischemia.  . Chronic anticoagulation     For PAF, on Eliquis   . Urinary  retention   . Pulmonary hypertension     Past Surgical History  Procedure Laterality Date  . Lumbar laminectomy  1995  . Back surgery    . Total knee arthroplasty Left   . Quadriceps tendon repair    . Cataract extraction    . Carotid endarterectomy    . Yag laser application Right 0/86/7619    Procedure: YAG LASER CAPSULOTOMY OF RIGHT EYE;  Surgeon: [name], MD;  Location: [facility];  Service: Ophthalmology;  Laterality: Right;  . Tonsillectomy    . Pacemaker insertion      DDD St. Jude PM, initially placed in 1993 by Dr. Nils Pyle. Device upgrade 10/2002 to DDD, by Dr. Rollene Fare complicated by bleeding.  . Carotid endarterectomy Right 2006  . Transurethral resection of prostate    . Cardioversion N/A 06/16/2013    Procedure: CARDIOVERSION BEDSIDE;  Surgeon: [name], MD;  Location: [facility];  Service: Cardiovascular;  Laterality: N/A;  . Pacemaker generator change  12/09/2009    SJM Accent DR RF gen change by Dr [name]    Family History  Problem Relation Age of Onset  . Multiple myeloma Father   . Hypertension Mother    Social History:  reports that he quit smoking about 56 years ago. His smoking use included Pipe. He  has never used smokeless tobacco. He reports that he does not drink alcohol or use illicit drugs.  Allergies:  Allergies  Allergen Reactions  . Statins Other (See Comments)    rhabdomyolisis  . Aggrenox [Aspirin-Dipyridamole Er] Other (See Comments)    Dizziness  . Carvedilol Other (See Comments)    Dizziness  . Claritin [Loratadine] Other (See Comments)    Dizziness & drowsiness   . Mucinex [Guaifenesin Er] Other (See Comments)    Dizziness, drowsiness  . Plavix [Clopidogrel Bisulfate] Other (See Comments)    Dizziness  . Rapaflo [Silodosin] Other (See Comments)    Dizziness for about 30- 45  minutes  . Travatan Z [Travoprost] Other (See Comments)    Eye irritation     (Not in a hospital admission)  Results for orders placed or  performed during the hospital encounter of 07/16/14 (from the past 48 hour(s))  CBC     Status: Abnormal   Collection Time: 07/16/14  2:31 PM  Result Value Ref Range   WBC 7.6 4.0 - 10.5 K/uL   RBC 3.55 (L) 4.22 - 5.81 MIL/uL   Hemoglobin 11.8 (L) 13.0 - 17.0 g/dL   HCT 35.4 (L) 39.0 - 52.0 %   MCV 99.7 78.0 - 100.0 fL   MCH 33.2 26.0 - 34.0 pg   MCHC 33.3 30.0 - 36.0 g/dL   RDW 15.1 11.5 - 15.5 %   Platelets 214 150 - 400 K/uL  Basic metabolic panel     Status: Abnormal   Collection Time: 07/16/14  2:31 PM  Result Value Ref Range   Sodium 132 (L) 135 - 145 mmol/L   Potassium 4.4 3.5 - 5.1 mmol/L   Chloride 87 (L) 96 - 112 mmol/L   CO2 32 19 - 32 mmol/L   Glucose, Bld 284 (H) 70 - 99 mg/dL   BUN 62 (H) 6 - 23 mg/dL   Creatinine, Ser 3.06 (H) 0.50 - 1.35 mg/dL   Calcium 9.4 8.4 - 10.5 mg/dL   GFR calc non Af Amer 16 (L) >90 mL/min   GFR calc Af Amer 19 (L) >90 mL/min    Comment: (NOTE) The eGFR has been calculated using the CKD EPI equation. This calculation has not been validated in all clinical situations. eGFR's persistently <90 mL/min signify possible Chronic Kidney Disease.    Anion gap 13 5 - 15  BNP (order ONLY if patient complains of dyspnea/SOB AND you have documented it for THIS visit)     Status: Abnormal   Collection Time: 07/16/14  2:31 PM  Result Value Ref Range   B Natriuretic Peptide 506.6 (H) 0.0 - 100.0 pg/mL  I-stat troponin, ED (not at [facility])     Status: Abnormal   Collection Time: 07/16/14  2:42 PM  Result Value Ref Range   Troponin i, poc 0.25 (HH) 0.00 - 0.08 ng/mL   Comment NOTIFIED PHYSICIAN    Comment 3            Comment: Due to the release kinetics of cTnI, a negative result within the first hours of the onset of symptoms does not rule out myocardial infarction with certainty. If myocardial infarction is still suspected, repeat the test at appropriate intervals.   Ammonia     Status: Abnormal   Collection Time: 07/16/14  5:19 PM  Result  Value Ref Range   Ammonia 83 (H) 11 - 32 umol/L   Dg Chest Port 1 View  07/16/2014   CLINICAL DATA:  Extreme weakness  with shortness of breath and bilateral leg swelling  EXAM: PORTABLE CHEST - 1 VIEW  COMPARISON:  06/28/2014  FINDINGS: Stable mild cardiac enlargement and cardiac pacer. Hazy bilateral attenuation in the lower third lung zones bilaterally, slightly less severe on the left when compared to the prior study and stable on the right. Mild diffuse interstitial prominence similar to previous.  IMPRESSION: Similar appearance to prior study. Bibasilar opacities again suggesting pleural effusions superimposed on mild interstitial pulmonary edema.   Electronically Signed   By: Skipper Cliche M.D.   On: 07/16/2014 14:59    Review of Systems  Constitutional: Negative for fever, chills, weight loss, malaise/fatigue and diaphoresis.  HENT: Negative for congestion, ear discharge, ear pain, hearing loss, nosebleeds, sore throat and tinnitus.   Eyes: Negative for blurred vision, double vision, photophobia, pain, discharge and redness.  Respiratory: Positive for shortness of breath. Negative for cough, hemoptysis, sputum production, wheezing and stridor.   Cardiovascular: Negative for chest pain, palpitations, orthopnea, claudication, leg swelling and PND.  Gastrointestinal: Negative for heartburn, nausea, vomiting, abdominal pain, diarrhea, constipation, blood in stool and melena.  Genitourinary: Negative for dysuria, urgency, frequency, hematuria and flank pain.  Musculoskeletal: Positive for myalgias. Negative for back pain, joint pain, falls and neck pain.  Skin: Negative for itching and rash.  Neurological: Negative for dizziness, tingling, tremors, sensory change, speech change, focal weakness, seizures, loss of consciousness, weakness and headaches.       Generalized weakness  Endo/Heme/Allergies: Negative for environmental allergies and polydipsia. Does not bruise/bleed easily.   Psychiatric/Behavioral: Negative for depression, suicidal ideas, hallucinations, memory loss and substance abuse. The patient is not nervous/anxious and does not have insomnia.     Blood pressure 123/66, pulse 58, resp. rate 27, height $RemoveBe'5\' 7"'[id number]$  (1.702 m), weight 84.823 kg (187 lb), SpO2 93 %. Physical Exam  Constitutional: He is oriented to person, place, and time. He appears well-developed and well-nourished.  HENT:  Head: Normocephalic and atraumatic.  Mouth/Throat: No oropharyngeal exudate.  Eyes: Conjunctivae and EOM are normal. Pupils are equal, round, and reactive to light. No scleral icterus.  Neck: Normal range of motion. Neck supple. No JVD present. No tracheal deviation present. No thyromegaly present.  Cardiovascular: Normal rate and regular rhythm.  Exam reveals no gallop and no friction rub.   No murmur heard. Respiratory: Effort normal and breath sounds normal. No respiratory distress. He has no wheezes. He has no rales.  GI: Soft. Bowel sounds are normal. He exhibits no distension. There is no tenderness. There is no rebound and no guarding.  Musculoskeletal: Normal range of motion. He exhibits no edema or tenderness.  Trace edema  Lymphadenopathy:    He has no cervical adenopathy.  Neurological: He is alert and oriented to person, place, and time. He has normal reflexes. He displays normal reflexes. No cranial nerve deficit. He exhibits normal muscle tone. Coordination normal.  + left foot drop  Skin: Skin is warm and dry. No rash noted. No erythema. No pallor.  Psychiatric: He has a normal mood and affect. His behavior is normal. Judgment and thought content normal.     Assessment/Plan  Leg weakness ? Related to age, hyponatremia Check esr, tsh, ana, cpk PT to evaluate and tx  CHF (EF 55%) Cont lasix Check trop i q6h x3  Dm2 fsbs ac and qhs, iss  CKD stage 4 Check cmp in am  Anemia Check cbc in am  DVT prophylaxis: scd, pt is on eliquis  Jani Gravel 07/16/2014, 8:03  PM

## 2014-07-16 NOTE — ED Notes (Signed)
Cardiologist at bedside.  

## 2014-07-16 NOTE — ED Notes (Signed)
Pt here for SOB, chest pain, cough and electrolyte imbalance. sts possible CHF. sts slight edema.

## 2014-07-16 NOTE — ED Notes (Signed)
Pt family refusing to have labs redrawn- pt caregiver states blood work was done at doctors office and results should be back by now. This Rn explained ED process and caregiver refuses.

## 2014-07-16 NOTE — Consult Note (Signed)
Cardiology Consult Note  Patient ID: [name] MRN: [id number], DOB: 21-Jun-1918 Date of Encounter: 07/16/2014, 4:25 PM Primary Physician: [name], MD Primary Cardiologist: Linard Millers Primary Electrophysiologist: J. Allred Nephrologist: A. 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 Jude PPM, urinary retention, HTN, HLD, ?possible prior TIA, remote carotid artery disease, DM who presents to [facility] with leg weakness. Although CAD was previously listed in his chart he denies this, and there are no prior caths/nucs for review.  His last admission was prolonged from 2/9-2/20 with a/c CHF with AKI on CKD requiring IV diuretics and metolazone, diuresing 10L with discharge Cr 2.5 and discharge weight 183lb. During admission he had mildly elevated flat trended troponin felt c/w demand ischemia in the setting of CHF admission. No ischemic w/u was indicated. He saw [name] in followup on 07/13/14 at which time he denied CP or SOB. He was upset that the team refused to approve [place] food on his birthday. Labwork that day showed Na 131, K 3.2, CO2 42, glucose 310, BUN 62, Cr 2.96. [name] recommended to discontinue metolazone and reserve for only when weight >185lb, skip Demadex on 3/2 but resume 3/4 at 37m daily, and potassium was increased. He was due for followup labs and came to the office to have these drawn then came to the ER for weakness in his legs.   States for the last 2 weeks he's had weakness in his legs - they begin wobbling sometimes when he's walking. He has not fallen but has had to steady himself on his walker and sometimes has to sit down. He also reports occasional hand tremors particularly when he lifts them up. Denies worsening dyspnea/orthopnea. Reports occasional hacky  cough. Occasionally has lower chest/abdominal pressure relieved with a BM. Weight stable at home between 178-179lbs, including today at home. Denies fever, chills, nausea, vomiting, bleeding, chest pain, palpitations or syncope. Reports compliance with 1500 cc fluids/15036mdiet. Has a caregiver that looks after him. He says he's tried to stay active walking up and down the halls of his home and trying to do leg lifts in bed. He still sees patients in primary care and apparently has seen several with influenza. His caregiver has noticed his O2 sats in the 80s on RA - currently 90s-100s on RA in the ER. Labs show Na 132, K 4.4, Cl 87, CO2 32, glucose 284, BUN 62/Cr 3.06, Hgb 11.8, troponin 0.25. CXR similar to prior study - bibasilar opacities again suggesting pleural effusions superimposed on mild interstitial pulmonary edema.   He is being awarded at the [employer] Past Medical History  Diagnosis Date  . Hyperlipidemia   . Spinal stenosis   . Anemia   . Carotid artery disease     a. s/p prior carotid endarterectomy.  . Marland KitchenVD (peripheral vascular disease)   . Hyponatremia     Chronic  . CVA (cerebral infarction)     a. When asked to clarify patient says he was told thumb numbness may be TIA. He does not recall formal stroke. CT head results are only available through [facility], not in [facility].  . CKD (chronic kidney disease) stage 4, GFR 15-29 ml/min   . Long term (current) use of anticoagulants   .  PAF (paroxysmal atrial fibrillation)     a. Asymptomatic. On Eliquis. CHADSVASC 7.  . Sick sinus syndrome     With DDD St. Jude PM, initially placed in 1993 by Dr. Nils Pyle. Device upgrade 10/2002 to DDD, by Dr. Rollene Fare complicated by bleeding. Subsequent gen change 2011.  [name] Hypertensive cardiovascular disease   . Chronic diastolic congestive heart failure     ECHO 05/20/12 LVEF estimated by 2D at 55-60%  . Arthritis   . Gait disorder   . Diabetic  peripheral neuropathy associated with type 2 diabetes mellitus   . Diabetes mellitus     insulin dependent  . Elevated troponin I level 06/22/2014    a. 06/2014 felt due to demand ischemia.  . Chronic anticoagulation     For PAF, on Eliquis   . Urinary retention   . Pulmonary hypertension      Most Recent Cardiac Studies: 2D Echo 12/2013 - Left ventricle: The cavity size was normal. There was moderate concentric hypertrophy. Systolic function was mildly reduced. The estimated ejection fraction was in the range of 45% to 50%. Wall motion was normal; there were no regional wall motion abnormalities. - Aortic valve: Trileaflet; moderately thickened, moderately calcified leaflets. Cusp separation was mildly reduced. Sclerosis without stenosis. Transvalvular velocity was minimally increased. There was no significant regurgitation. Valve area (VTI): 1.27 cm^2. Valve area (Vmax): 1.31 cm^2. - Aortic root: The aortic root was normal in size. - Ascending aorta: The ascending aorta was normal in size. - Mitral valve: There was mild regurgitation. - Left atrium: The atrium was mildly dilated. - Right ventricle: The cavity size was moderately dilated. Wall thickness was normal. Systolic function was mildly reduced. - Right atrium: The atrium was moderately dilated. - Pulmonary arteries: Systolic pressure was severely increased. PA peak pressure: 76 mm Hg (S). Impressions: - Compared to the prior study on 06/15/13 right ventricular systolic function is now mildly impaired and there is severe pulmonary hypertension PASP 73 mmHg.   Surgical History:  Past Surgical History  Procedure Laterality Date  . Lumbar laminectomy  1995  . Back surgery    . Total knee arthroplasty Left   . Quadriceps tendon repair    . Cataract extraction    . Carotid endarterectomy    . Yag laser application Right 6/95/0722    Procedure: YAG LASER CAPSULOTOMY OF RIGHT EYE;  Surgeon: [name], MD;  Location: [place];  Service: Ophthalmology;  Laterality: Right;  . Tonsillectomy    . Pacemaker insertion      DDD St. Jude PM, initially placed in 1993 by Dr. Nils Pyle. Device upgrade 10/2002 to DDD, by Dr. Rollene Fare complicated by bleeding.  . Carotid endarterectomy Right 2006  . Transurethral resection of prostate    . Cardioversion N/A 06/16/2013    Procedure: CARDIOVERSION BEDSIDE;  Surgeon: [name], MD;  Location: [place];  Service: Cardiovascular;  Laterality: N/A;  . Pacemaker generator change  12/09/2009    SJM Accent DR RF gen change by Dr [name]     Home Meds: Prior to Admission medications   Medication Sig Start Date End Date Taking? Authorizing Provider  allopurinol (ZYLOPRIM) 100 MG tablet Take 100 mg by mouth 2 (two) times daily.     Historical Provider, MD  amiodarone (PACERONE) 200 MG tablet Take 1 tablet (200 mg total) by mouth at bedtime. 01/01/14   Bary Leriche, PA-C  apixaban (ELIQUIS) 2.5 MG TABS tablet Take 1 tablet (2.5 mg total) by  mouth 2 (two) times daily. 05/21/14   Belva Crome III, MD  dutasteride (AVODART) 0.5 MG capsule Take 0.5 mg by mouth daily.     Historical Provider, MD  ferrous sulfate 325 (65 FE) MG tablet Take 325 mg by mouth every other day.     Historical Provider, MD  insulin aspart (NOVOLOG FLEXPEN) 100 UNIT/ML FlexPen Inject 2-9 Units into the skin 3 (three) times daily with meals. Sliding scale If at lunch gbc is between 159 do not give any. If 299 or above give 3 units. At bedtime it is always 9 units    Historical Provider, MD  insulin glargine (LANTUS) 100 UNIT/ML injection Inject 9-10 Units into the skin at bedtime.     Historical Provider, MD  isosorbide mononitrate (IMDUR) 60 MG 24 hr tablet Take 1 tablet (60 mg total) by mouth daily. 07/14/14   Belva Crome III, MD  metoprolol tartrate (LOPRESSOR) 25 MG tablet Take 1 tablet (25 mg total) by mouth daily. 06/21/14   Belva Crome III, MD  Multiple Vitamin (MULTIVITAMIN  WITH MINERALS) TABS Take 1 tablet by mouth daily. Diabetic pak by Petra Kuba from [place] Provider, MD  nitroGLYCERIN (NITROSTAT) 0.4 MG SL tablet Place 1 tablet (0.4 mg total) under the tongue every 5 (five) minutes as needed for chest pain. 12/03/13   Belva Crome III, MD  polyethylene glycol [facility] / Floria Raveling) packet Take 17 g by mouth 2 (two) times daily as needed for mild constipation. For constipation    Historical Provider, MD  Polyvinyl Alcohol-Povidone (REFRESH OP) Place 1 drop into both eyes 2 (two) times daily. Refresh Gel Opth    Historical Provider, MD  potassium chloride SA (K-DUR,KLOR-CON) 20 MEQ tablet Take 20 mEq by mouth daily.  01/15/14   Historical Provider, MD  potassium chloride SA (K-DUR,KLOR-CON) 20 MEQ tablet take 1 tablet by mouth once daily 07/14/14   Belva Crome III, MD  Tafluprost (ZIOPTAN) 0.0015 % SOLN Place 1 drop into both eyes at bedtime. Use at night    Historical Provider, MD  tamsulosin (FLOMAX) 0.4 MG CAPS capsule Take 0.8 mg by mouth at bedtime.    Historical Provider, MD  torsemide (DEMADEX) 20 MG tablet Take 4 tablets (80 mg total) by mouth daily. 07/03/14   Eileen Stanford, PA-C    Allergies:  Allergies  Allergen Reactions  . Statins Other (See Comments)    rhabdomyolisis  . Aggrenox [Aspirin-Dipyridamole Er] Other (See Comments)    Dizziness  . Carvedilol Other (See Comments)    Dizziness  . Claritin [Loratadine] Other (See Comments)    Dizziness & drowsiness   . Mucinex [Guaifenesin Er] Other (See Comments)    Dizziness, drowsiness  . Plavix [Clopidogrel Bisulfate] Other (See Comments)    Dizziness  . Rapaflo [Silodosin] Other (See Comments)    Dizziness for about 30- 45  minutes  . Travatan Z [Travoprost] Other (See Comments)    Eye irritation    History   Social History  . Marital Status: Widowed    Spouse Name: N/A  . Number of Children: 7  . Years of Education: N/A   Occupational History  . MD    Social History Main  Topics  . Smoking status: Former Smoker -- 2 years    Types: Pipe    Quit date: 05/14/1958  . Smokeless tobacco: Never Used  . Alcohol Use: No     Comment: Cocktails occasionally  . Drug Use: No  .  Sexual Activity: Not Currently   Other Topics Concern  . Not on file   Social History Narrative   Has a caregiver that lives with him. Still works regularly as a Immunologist     Family History  Problem Relation Age of Onset  . Multiple myeloma Father   . Hypertension Mother     Review of Systems General: negative for chills, fever, night sweats or weight changes.  Cardiovascular: see above Dermatological: negative for rash Respiratory: negative for wheezing Urologic: negative for hematuria Abdominal: negative for nausea, vomiting, diarrhea, bright red blood per rectum, melena, or hematemesis Neurologic: negative for visual changes, syncope, or dizziness. Does report occasional vertigo. All other systems reviewed and are otherwise negative except as noted above.  Labs:   Lab Results  Component Value Date   WBC 7.6 07/16/2014   HGB 11.8* 07/16/2014   HCT 35.4* 07/16/2014   MCV 99.7 07/16/2014   PLT 214 07/16/2014     Recent Labs Lab 07/16/14 1431  NA 132*  K 4.4  CL 87*  CO2 32  BUN 62*  CREATININE 3.06*  CALCIUM 9.4  GLUCOSE 284*   Lab Results  Component Value Date   DDIMER 0.72* 08/26/2012    Radiology/Studies:  Dg Chest 2 View  06/28/2014   CLINICAL DATA:  Occasional cough without fever, history of CHF and diabetes and atrial fibrillation, remote history of smoking.  EXAM: CHEST  2 VIEW  COMPARISON:  Portable chest x-ray of June 22, 2014  FINDINGS: The lungs are mildly hypoinflated. The interstitial markings have improved slightly since the previous study. There remain bilateral pleural effusions. The cardiac silhouette is mildly enlarged. The pulmonary vascularity is engorged but more distinct than on the previous study. The permanent  pacemaker is in appropriate position radiographically. The bony structures exhibit no acute abnormalities.  IMPRESSION: Congestive heart failure with mild pulmonary interstitial edema and small to moderate-sized bilateral pleural effusions. There has been slight interval improvement since the previous study.   Electronically Signed   By: David  Martinique   On: 06/28/2014 10:19   Dg Chest Port 1 View  07/16/2014   CLINICAL DATA:  Extreme weakness with shortness of breath and bilateral leg swelling  EXAM: PORTABLE CHEST - 1 VIEW  COMPARISON:  06/28/2014  FINDINGS: Stable mild cardiac enlargement and cardiac pacer. Hazy bilateral attenuation in the lower third lung zones bilaterally, slightly less severe on the left when compared to the prior study and stable on the right. Mild diffuse interstitial prominence similar to previous.  IMPRESSION: Similar appearance to prior study. Bibasilar opacities again suggesting pleural effusions superimposed on mild interstitial pulmonary edema.   Electronically Signed   By: Skipper Cliche M.D.   On: 07/16/2014 14:59   Dg Chest Portable 1 View  06/22/2014   CLINICAL DATA:  81 year old male with shortness of Breath. Initial encounter.  EXAM: PORTABLE CHEST - 1 VIEW  COMPARISON:  05/27/2014 and earlier.  FINDINGS: Portable AP semi upright view at 1101 hours. Continued widespread interstitial and veiling pulmonary opacity. Stable cardiac size and mediastinal contours. No pneumothorax. No air bronchograms identified. Visualized tracheal air column is within normal limits. Stable left chest cardiac pacemaker.  IMPRESSION: Pulmonary edema and bilateral pleural effusions with lung base atelectasis.   Electronically Signed   By: Genevie Ann M.D.   On: 06/22/2014 11:34   Wt Readings from Last 3 Encounters:  07/16/14 187 lb (84.823 kg)  07/13/14 180 lb 3.2 oz (81.738 kg)  07/03/14 183  lb 11.2 oz (83.326 kg)    EKG:  First tracing: poor data quality. Atrial paced, ventricular sensed with  prolonged AV delay, rate about 65bpm Second tracing: AV paced, rate 67bpm  Physical Exam: Blood pressure 122/78, pulse 60, resp. rate 28, height '5\' 7"'  (1.702 m), weight 187 lb (84.823 kg), SpO2 96 %. General: Well developed elderly AAM, in no acute distress. Head: Normocephalic, atraumatic, sclera non-icteric, no xanthomas, nares are without discharge.  Neck: Negative for carotid bruits. JVD not elevated. Lungs: Diminished at bases but otherwise no wheezes, rales, or rhonchi. Breathing is unlabored. Heart: RRR with S1 S2. No murmurs, rubs, or gallops appreciated. Abdomen: Soft, non-tender, non-distended with normoactive bowel sounds. No hepatomegaly. No rebound/guarding. No obvious abdominal masses. Msk:  Strength and tone appear normal for age. Extremities: No clubbing or cyanosis. No edema.  Distal pedal pulses are 2+ and equal bilaterally. Asterixis present. Neuro: Alert and oriented X 3. No focal deficit. No facial asymmetry. Moves all extremities spontaneously. Psych:  Responds to questions appropriately with a slower cadence of speech.    ASSESSMENT AND PLAN:   1. Lower extremity weakness 2. AKI on CKD stage IV with uremia 3. Chronic diastolic CHF complicated by severe pulmonary hypertension, appears euvolemic 4. SSS s/p pacemaker 5. Diabetes mellitus with hyperglycemia and subsequent pseudohyponatremia 6. Elevated troponin in the absence of CP/SOB, possibly chronic leak due to CKD  At present time we do not feel that etiology of his weakness is related to his cardiac status. He reports leg weakness, tremors, and has slowed cadence of speech on exam. The more likely cause is uremia in the setting of advanced kidney disease. Will add ammonia level - consider administration of lactulose if this is elevated. From a HF standpoint he appears compensated thus would recommend continuation of current regimen. Doubt any clinical significance of his elevated troponin, nor is he a candidate to  pursue further workup at present time. Would not trend further sets. Fortunately he denies any angina. With his multiple comorbidities it may be worthwhile to consider palliative care consultation given recent decompensations. See below for additional thoughts.  Signed, Melina Copa PA-C 07/16/2014, 4:25 PM   Patient seen and examined with Melina Copa, PA-C. We discussed all aspects of the encounter. I agree with the assessment and plan as stated above.   His HF is actually well compensated. In talking to him, his main complaint is a tremor and progressive weakness and jerking in his knees. On exam, volume status looks good but has severe asterixis. I suspect this is either uremic in nature or possible hepatic failure due to cardiac cirrhosis and passive congestion. Would recommend checking an ammonia level. If elevated would start lactulose. If not elevated, this is likely uremic and since he is not candidate for dialysis may be end-stage. Have d/w with ER team and they will call IM to admit. Would continue current HF regimen with metolazone as needed for weight >= 185 pounds.   Ivoree Felmlee,MD 5:20 PM

## 2014-07-16 NOTE — Telephone Encounter (Signed)
Per Dr.Smith will come to the office today for labs. Pt caretaker [name]) aware.

## 2014-07-16 NOTE — ED Provider Notes (Signed)
Results for orders placed or performed during the hospital encounter of 07/16/14  CBC  Result Value Ref Range   WBC 7.6 4.0 - 10.5 K/uL   RBC 3.55 (L) 4.22 - 5.81 MIL/uL   Hemoglobin 11.8 (L) 13.0 - 17.0 g/dL   HCT 35.4 (L) 39.0 - 52.0 %   MCV 99.7 78.0 - 100.0 fL   MCH 33.2 26.0 - 34.0 pg   MCHC 33.3 30.0 - 36.0 g/dL   RDW 15.1 11.5 - 15.5 %   Platelets 214 150 - 400 K/uL  Basic metabolic panel  Result Value Ref Range   Sodium 132 (L) 135 - 145 mmol/L   Potassium 4.4 3.5 - 5.1 mmol/L   Chloride 87 (L) 96 - 112 mmol/L   CO2 32 19 - 32 mmol/L   Glucose, Bld 284 (H) 70 - 99 mg/dL   BUN 62 (H) 6 - 23 mg/dL   Creatinine, Ser 3.06 (H) 0.50 - 1.35 mg/dL   Calcium 9.4 8.4 - 10.5 mg/dL   GFR calc non Af Amer 16 (L) >90 mL/min   GFR calc Af Amer 19 (L) >90 mL/min   Anion gap 13 5 - 15  BNP (order ONLY if patient complains of dyspnea/SOB AND you have documented it for THIS visit)  Result Value Ref Range   B Natriuretic Peptide 506.6 (H) 0.0 - 100.0 pg/mL  Ammonia  Result Value Ref Range   Ammonia 83 (H) 11 - 32 umol/L  I-stat troponin, ED (not at [facility])  Result Value Ref Range   Troponin i, poc 0.25 (HH) 0.00 - 0.08 ng/mL   Comment NOTIFIED PHYSICIAN    Comment 3           Dg Chest 2 View  06/28/2014   CLINICAL DATA:  Occasional cough without fever, history of CHF and diabetes and atrial fibrillation, remote history of smoking.  EXAM: CHEST  2 VIEW  COMPARISON:  Portable chest x-ray of June 22, 2014  FINDINGS: The lungs are mildly hypoinflated. The interstitial markings have improved slightly since the previous study. There remain bilateral pleural effusions. The cardiac silhouette is mildly enlarged. The pulmonary vascularity is engorged but more distinct than on the previous study. The permanent pacemaker is in appropriate position radiographically. The bony structures exhibit no acute abnormalities.  IMPRESSION: Congestive heart failure with mild pulmonary interstitial edema and small  to moderate-sized bilateral pleural effusions. There has been slight interval improvement since the previous study.   Electronically Signed   By: David  Martinique   On: 06/28/2014 10:19   Dg Chest Port 1 View  07/16/2014   CLINICAL DATA:  Extreme weakness with shortness of breath and bilateral leg swelling  EXAM: PORTABLE CHEST - 1 VIEW  COMPARISON:  06/28/2014  FINDINGS: Stable mild cardiac enlargement and cardiac pacer. Hazy bilateral attenuation in the lower third lung zones bilaterally, slightly less severe on the left when compared to the prior study and stable on the right. Mild diffuse interstitial prominence similar to previous.  IMPRESSION: Similar appearance to prior study. Bibasilar opacities again suggesting pleural effusions superimposed on mild interstitial pulmonary edema.   Electronically Signed   By: Skipper Cliche M.D.   On: 07/16/2014 14:59   Dg Chest Portable 1 View  06/22/2014   CLINICAL DATA:  81 year old male with shortness of Breath. Initial encounter.  EXAM: PORTABLE CHEST - 1 VIEW  COMPARISON:  05/27/2014 and earlier.  FINDINGS: Portable AP semi upright view at 1101 hours. Continued widespread interstitial and veiling  pulmonary opacity. Stable cardiac size and mediastinal contours. No pneumothorax. No air bronchograms identified. Visualized tracheal air column is within normal limits. Stable left chest cardiac pacemaker.  IMPRESSION: Pulmonary edema and bilateral pleural effusions with lung base atelectasis.   Electronically Signed   By: Genevie Ann M.D.   On: 06/22/2014 11:34   1. CHF exacerbation   2. SOB (shortness of breath)   3. Uremia   4. Weakness generalized    Filed Vitals:   07/16/14 2000  BP: 105/79  Pulse: 61  Resp: 24   Patient care acquired from Domenic Moras, PA-C pending ammonia level for admission to the hospital.   I have reviewed nursing notes, vital signs, and all appropriate lab and imaging results for this patient.  Patient will be admitted to the  hospitalist service for further management and evaluation.   Harlow Mares, PA-C 07/16/14 2105  Pamella Pert, MD 07/17/14 1130

## 2014-07-16 NOTE — ED Notes (Signed)
PA at bedside discussing admission vs discharge

## 2014-07-16 NOTE — ED Notes (Signed)
Cardiology PA at bedside. 

## 2014-07-16 NOTE — ED Notes (Signed)
Pt states he does not feel comfortable getting up to try and walk, reports significantly increased weakness in the last two weeks, states he has not been able to ambulate with his walker and has almost fallen multiple times. PA Bowie notified. Pt requesting more information about his lab results, PA notified and is at bedside to speak with patient at this time.

## 2014-07-16 NOTE — ED Provider Notes (Signed)
CSN: [id number]     Arrival date & time 07/16/14  1317 History   First MD Initiated Contact with Patient 07/16/14 1407     Chief Complaint  Patient presents with  . Shortness of Breath  . Chest Pain  . 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 Patient was admitted to the hospital last month and was discharged approximately 2 weeks ago for a CHF exacerbation. States he has been doing well for a short amount of time but has noticed increased shortness of breath and weakness. Today he could hardly walk because his legs were so weak. His personal caregiver noticed that his oxygenation was in the 80s percents on room air and therefore brought him to the ER for further evaluation. Patient states he was actually at his cardiologist's office today for blood work. Patient denies having any chest pain, no fever, chills, productive cough, diaphoresis, abdominal cramping, nausea vomiting or diarrhea. Patient felt that his symptoms likely due to his CHF exacerbation. Patient also has been seen several patients at his practice that was diagnosed with influenza infection. He is up-to-date with his shots.     Past Medical History  Diagnosis Date  . Hyperlipidemia   . Coronary artery disease   . Spinal stenosis   . Pneumonia   . Anemia   . Carotid artery disease   . PVD (peripheral vascular disease)   . Hyponatremia     Chronic  . CVA (cerebral infarction)   . CKD (chronic kidney disease) stage 4, GFR 15-29 ml/min   . Long term (current) use of anticoagulants    No bleeding on Eliquis  . PAF (paroxysmal atrial fibrillation)     Asymptomatic. On Eliquis.  . Sick sinus syndrome     With DDD St. Jude PM, initially placed in 1993 by Dr. Nils Pyle. Device upgrade 10/2002 to DDD, by Dr. Rollene Fare complicated by bleeding.  [name] Hypertensive cardiovascular disease   . CHF (congestive heart failure)   . Chronic diastolic congestive heart failure     ECHO 05/20/12 LVEF estimated by 2D at 55-60%  . Arthritis   . Shortness of breath   . Gait disorder 03/31/2014  . Diabetic peripheral neuropathy associated with type 2 diabetes mellitus 03/31/2014  . Diabetes mellitus     insulin dependent  . Elevated troponin I level 06/22/2014  . Chronic anticoagulation 04/03/2013    For PAF, on Eliquis    Past Surgical History  Procedure Laterality Date  . Lumbar laminectomy  1995  . Back surgery    . Total knee arthroplasty Left   . Quadriceps tendon repair    . Cataract extraction    . Carotid endarterectomy    . Yag laser application Right 08/20/8117    Procedure: YAG LASER CAPSULOTOMY OF RIGHT EYE;  Surgeon: [name], MD;  Location: [place];  Service: Ophthalmology;  Laterality: Right;  . Tonsillectomy    . Pacemaker insertion      DDD St. Jude PM, initially placed in 1993 by Dr. Nils Pyle. Device upgrade 10/2002 to DDD, by Dr. Rollene Fare complicated by bleeding.  . Carotid endarterectomy Right 2006  . Transurethral resection of prostate    .  Cardioversion N/A 06/16/2013    Procedure: CARDIOVERSION BEDSIDE;  Surgeon: [name], MD;  Location: [place];  Service: Cardiovascular;  Laterality: N/A;  . Pacemaker generator change  12/09/2009    SJM Accent DR RF gen change by Dr [name]   Family History  Problem Relation Age of Onset  . Multiple myeloma Father   . Hypertension Mother    History  Substance Use Topics  . Smoking status: Former Smoker -- 2 years    Types: Pipe    Quit date: 05/14/1958  . Smokeless tobacco: Never Used  . Alcohol Use: No      Comment: Cocktails occasionally    Review of Systems  All other systems reviewed and are negative.     Allergies  Statins; Aggrenox; Carvedilol; Claritin; Mucinex; Plavix; Rapaflo; and Travatan z  Home Medications   Prior to Admission medications   Medication Sig Start Date End Date Taking? Authorizing Provider  allopurinol (ZYLOPRIM) 100 MG tablet Take 100 mg by mouth 2 (two) times daily.     Historical Provider, MD  amiodarone (PACERONE) 200 MG tablet Take 1 tablet (200 mg total) by mouth at bedtime. 01/01/14   Bary Leriche, PA-C  apixaban (ELIQUIS) 2.5 MG TABS tablet Take 1 tablet (2.5 mg total) by mouth 2 (two) times daily. 05/21/14   Belva Crome III, MD  dutasteride (AVODART) 0.5 MG capsule Take 0.5 mg by mouth daily.     Historical Provider, MD  ferrous sulfate 325 (65 FE) MG tablet Take 325 mg by mouth every other day.     Historical Provider, MD  insulin aspart (NOVOLOG FLEXPEN) 100 UNIT/ML FlexPen Inject 2-9 Units into the skin 3 (three) times daily with meals. Sliding scale If at lunch gbc is between 159 do not give any. If 299 or above give 3 units. At bedtime it is always 9 units    Historical Provider, MD  insulin glargine (LANTUS) 100 UNIT/ML injection Inject 9-10 Units into the skin at bedtime.     Historical Provider, MD  isosorbide mononitrate (IMDUR) 60 MG 24 hr tablet Take 1 tablet (60 mg total) by mouth daily. 07/14/14   Belva Crome III, MD  metoprolol tartrate (LOPRESSOR) 25 MG tablet Take 1 tablet (25 mg total) by mouth daily. 06/21/14   Belva Crome III, MD  Multiple Vitamin (MULTIVITAMIN WITH MINERALS) TABS Take 1 tablet by mouth daily. Diabetic pak by Petra Kuba from [place] Provider, MD  nitroGLYCERIN (NITROSTAT) 0.4 MG SL tablet Place 1 tablet (0.4 mg total) under the tongue every 5 (five) minutes as needed for chest pain. 12/03/13   Belva Crome III, MD  polyethylene glycol [facility] / Floria Raveling) packet Take 17 g by mouth 2 (two) times daily as needed for  mild constipation. For constipation    Historical Provider, MD  Polyvinyl Alcohol-Povidone (REFRESH OP) Place 1 drop into both eyes 2 (two) times daily. Refresh Gel Opth    Historical Provider, MD  potassium chloride SA (K-DUR,KLOR-CON) 20 MEQ tablet Take 20 mEq by mouth daily.  01/15/14   Historical Provider, MD  potassium chloride SA (K-DUR,KLOR-CON) 20 MEQ tablet take 1 tablet by mouth once daily 07/14/14   Belva Crome III, MD  Tafluprost (ZIOPTAN) 0.0015 % SOLN Place 1 drop into both eyes at bedtime. Use at night    Historical Provider, MD  tamsulosin (FLOMAX) 0.4 MG CAPS capsule Take 0.8 mg by mouth at bedtime.    Historical Provider, MD  torsemide (DEMADEX) 20 MG tablet Take 4 tablets (80 mg total) by mouth daily. 07/03/14   Eileen Stanford, PA-C   There were no vitals taken for this visit. Physical Exam  Constitutional: He is oriented to person, place, and time. He appears well-developed and well-nourished. No distress.  HENT:  Head: Atraumatic.  Eyes: Conjunctivae are normal.  Neck: Normal range of motion. Neck supple. No JVD present.  Cardiovascular: Normal rate and regular rhythm.   Pulmonary/Chest: Effort normal and breath sounds normal.  Poor effort, mild crackles heard at base of lung no wheezes or rhonchi heard.  Abdominal: Soft. There is no tenderness.  Musculoskeletal: He exhibits edema (Trace pitting edema to bilateral lower extremities. Intact distal pulses.).  Weakness to BLE with asterixis to BUE.    Neurological: He is alert and oriented to person, place, and time.  Skin: No rash noted.  Psychiatric: He has a normal mood and affect.    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 EKG shows a paced rhythm. Chest x-ray shows evidence of pleural effusion, unchanged from prior.  His troponin is elevated at 0.25 however patient denies having any chest pain at this time. He has elevated troponin from priors visit as well. His cardiologist is [name] which I will consult for further management. Care discussed with Dr. Wilson Singer.    3:20 PM I have consulted with cardiology and spoke with Wannetta Sender, who will send a specialist to evaluate pt in ER.    5:04 PM Cardiologist, Dr. Sung Amabile has evaluated pt and felt his sxs is not likely CHF but more likely uremic from chronic kidney disease as pt exhibit asterixis and leg weakness.    5:24 PM Pt currently awaits ammonia level.  Care discussed with oncoming provider who will determine disposition once ammonia level is back and if pt is amendable for admission.  I did consult with [facility], Dr. Allyson Sabal who is aware of plan.  She can be called directly at [place] Reviewed  CBC - Abnormal; Notable for the following:    RBC 3.55 (*)    Hemoglobin 11.8 (*)    HCT 35.4 (*)    All other components within normal limits  BASIC METABOLIC PANEL - Abnormal; Notable for the following:    Sodium 132 (*)    Chloride 87 (*)    Glucose, Bld 284 (*)    BUN 62 (*)    Creatinine, Ser 3.06 (*)    GFR calc non Af Amer 16 (*)    GFR calc Af Amer 19 (*)    All other components within normal limits  BRAIN NATRIURETIC PEPTIDE - Abnormal; Notable for the following:    B Natriuretic Peptide 506.6 (*)    All other components within normal limits  I-STAT TROPOININ, ED - Abnormal; Notable for the following:    Troponin i, poc 0.25 (*)    All other components within normal limits  AMMONIA    Imaging Review Dg Chest Port 1 View  07/16/2014   CLINICAL DATA:  Extreme weakness with shortness of breath and bilateral leg swelling  EXAM: PORTABLE CHEST - 1 VIEW  COMPARISON:  06/28/2014  FINDINGS: Stable mild cardiac enlargement and cardiac pacer. Hazy bilateral attenuation in the lower third lung zones bilaterally, slightly  less  severe on the left when compared to the prior study and stable on the right. Mild diffuse interstitial prominence similar to previous.  IMPRESSION: Similar appearance to prior study. Bibasilar opacities again suggesting pleural effusions superimposed on mild interstitial pulmonary edema.   Electronically Signed   By: Skipper Cliche M.D.   On: 07/16/2014 14:59     EKG Interpretation None      MDM   Final diagnoses:  SOB (shortness of breath)    BP 127/57 mmHg  Pulse 62  Resp 16  Ht '5\' 7"'  (1.702 m)  Wt 187 lb (84.823 kg)  BMI 29.28 kg/m2  SpO2 94%  I have reviewed nursing notes and vital signs. I personally reviewed the imaging tests through PACS system  I reviewed available ER/hospitalization records thought the EMR     Domenic Moras, PA-C 07/16/14 [place], MD 07/17/14 0700

## 2014-07-17 ENCOUNTER — Inpatient Hospital Stay (HOSPITAL_COMMUNITY): Payer: Medicare Other

## 2014-07-17 DIAGNOSIS — K7460 Unspecified cirrhosis of liver: Secondary | ICD-10-CM | POA: Insufficient documentation

## 2014-07-17 DIAGNOSIS — I5032 Chronic diastolic (congestive) heart failure: Secondary | ICD-10-CM | POA: Insufficient documentation

## 2014-07-17 DIAGNOSIS — K7469 Other cirrhosis of liver: Secondary | ICD-10-CM

## 2014-07-17 DIAGNOSIS — R531 Weakness: Secondary | ICD-10-CM

## 2014-07-17 DIAGNOSIS — R29898 Other symptoms and signs involving the musculoskeletal system: Secondary | ICD-10-CM

## 2014-07-17 DIAGNOSIS — I509 Heart failure, unspecified: Secondary | ICD-10-CM

## 2014-07-17 DIAGNOSIS — N184 Chronic kidney disease, stage 4 (severe): Secondary | ICD-10-CM

## 2014-07-17 LAB — GLUCOSE, CAPILLARY
GLUCOSE-CAPILLARY: 152 mg/dL — AB (ref 70–99)
GLUCOSE-CAPILLARY: 187 mg/dL — AB (ref 70–99)
Glucose-Capillary: 208 mg/dL — ABNORMAL HIGH (ref 70–99)
Glucose-Capillary: 209 mg/dL — ABNORMAL HIGH (ref 70–99)

## 2014-07-17 LAB — CBC
HCT: 32.9 % — ABNORMAL LOW (ref 39.0–52.0)
Hemoglobin: 10.9 g/dL — ABNORMAL LOW (ref 13.0–17.0)
MCH: 32.9 pg (ref 26.0–34.0)
MCHC: 33.1 g/dL (ref 30.0–36.0)
MCV: 99.4 fL (ref 78.0–100.0)
Platelets: 201 10*3/uL (ref 150–400)
RBC: 3.31 MIL/uL — ABNORMAL LOW (ref 4.22–5.81)
RDW: 15.2 % (ref 11.5–15.5)
WBC: 6.1 10*3/uL (ref 4.0–10.5)

## 2014-07-17 LAB — COMPREHENSIVE METABOLIC PANEL
ALK PHOS: 49 U/L (ref 39–117)
ALT: 40 U/L (ref 0–53)
ANION GAP: 12 (ref 5–15)
AST: 49 U/L — AB (ref 0–37)
Albumin: 3 g/dL — ABNORMAL LOW (ref 3.5–5.2)
BILIRUBIN TOTAL: 0.8 mg/dL (ref 0.3–1.2)
BUN: 60 mg/dL — AB (ref 6–23)
CALCIUM: 9.2 mg/dL (ref 8.4–10.5)
CO2: 32 mmol/L (ref 19–32)
Chloride: 90 mmol/L — ABNORMAL LOW (ref 96–112)
Creatinine, Ser: 2.77 mg/dL — ABNORMAL HIGH (ref 0.50–1.35)
GFR calc Af Amer: 21 mL/min — ABNORMAL LOW (ref >90)
GFR, EST NON AFRICAN AMERICAN: 18 mL/min — AB (ref >90)
Glucose, Bld: 231 mg/dL — ABNORMAL HIGH (ref 70–99)
Potassium: 3.8 mmol/L (ref 3.5–5.1)
Sodium: 134 mmol/L — ABNORMAL LOW (ref 135–145)
TOTAL PROTEIN: 6.3 g/dL (ref 6.0–8.3)

## 2014-07-17 LAB — CK TOTAL AND CKMB (NOT AT ARMC)
CK, MB: 2.2 ng/mL (ref 0.3–4.0)
CK, MB: 2.1 ng/mL (ref 0.3–4.0)
RELATIVE INDEX: 1.7 (ref 0.0–2.5)
RELATIVE INDEX: 1.5 (ref 0.0–2.5)
Total CK: 130 U/L (ref 7–232)
Total CK: 139 U/L (ref 7–232)

## 2014-07-17 LAB — TROPONIN I
Troponin I: 0.19 ng/mL — ABNORMAL HIGH (ref <0.031)
Troponin I: 0.2 ng/mL — ABNORMAL HIGH (ref <0.031)

## 2014-07-17 MED ORDER — LACTULOSE 10 GM/15ML PO SOLN
30.0000 g | Freq: Two times a day (BID) | ORAL | Status: DC
  Administered 2014-07-17 – 2014-07-19 (×5): 30 g via ORAL
  Filled 2014-07-17 (×6): qty 45

## 2014-07-17 MED ORDER — INSULIN GLARGINE 100 UNIT/ML ~~LOC~~ SOLN
9.0000 [IU] | Freq: Every day | SUBCUTANEOUS | Status: DC
  Administered 2014-07-17 – 2014-07-18 (×2): 9 [IU] via SUBCUTANEOUS
  Filled 2014-07-17 (×3): qty 0.09

## 2014-07-17 NOTE — Progress Notes (Signed)
Oxygen saturations dropping to high 70`s while sleeping.Commenced on 2l o2

## 2014-07-17 NOTE — Progress Notes (Signed)
[facility] PROGRESS NOTE  [name] RWE:[id number] DOB: 1919-02-13 DOA: 07/16/2014 PCP: [name], MD  Assessment/Plan: 81 y/o Chronic CHF, Severe Pulmonary HTN, CKD stage IV, PAF on Eliquis/amiodarone, SSS s/p PPM, HTN, HLD, DM who presents to [facility] with leg weakness, difficulty walking,  DOE  1. Generalized weakness, DJD; neuro exam is non focal; will obtain PT/OT evaluation  2. Chronic CHF, combined HF, with severe pulmonary HTN; CXR: mild edema, mild hypoxia; resumed home regimen with demedex; will obtain desat study prior to discharge  -patient with progressive heart failure with multiorgan dysfunction, liver congestion, progressive renal failure may be a candidate for palliative care in near future if agreed   3. PAF on Eliquis/amiodarone, SSS s/p PPM 4. DM cont insulin regimen, will monitor to avoid hypoglycemia with CKD   Code Status: full Family Communication: d/w patient (indicate person spoken with, relationship, and if by phone, the number) Disposition Plan: home pend clinical improvement; PT/OT   Consultants:  Cardiology   Procedures:  none  Antibiotics:  none (indicate start date, and stop date if known)  HPI/Subjective: alert  Objective: Filed Vitals:   07/17/14 0818  BP: 127/97  Pulse: 66  Temp:   Resp: 20    Intake/Output Summary (Last 24 hours) at 07/17/14 0944 Last data filed at 07/17/14 0400  Gross per 24 hour  Intake      0 ml  Output   1100 ml  Net  -1100 ml   Filed Weights   07/16/14 1418 07/17/14 0425  Weight: 84.823 kg (187 lb) 83.4 kg (183 lb 13.8 oz)    Exam:   General:  alert  Cardiovascular: s1,s2 rrr  Respiratory: few crackles at bases   Abdomen: soft, nt  Musculoskeletal: no leg edema   Data Reviewed: Basic Metabolic Panel:  Recent Labs Lab 07/13/14 1002 07/16/14 1213 07/16/14 1431 07/17/14 0300  NA 131* 130* 132* 134*  K 3.2* 4.2 4.4 3.8  CL 85* 87* 87* 90*  CO2 42* 37* 32 32   GLUCOSE 310* 272* 284* 231*  BUN 62* 67* 62* 60*  CREATININE 2.96* 3.08* 3.06* 2.77*  CALCIUM 9.6 9.6 9.4 9.2   Liver Function Tests:  Recent Labs Lab 07/17/14 0300  AST 49*  ALT 40  ALKPHOS 49  BILITOT 0.8  PROT 6.3  ALBUMIN 3.0*   No results for input(s): LIPASE, AMYLASE in the last 168 hours.  Recent Labs Lab 07/16/14 1719  AMMONIA 83*   CBC:  Recent Labs Lab 07/16/14 1213 07/16/14 1431 07/17/14 0300  WBC 7.4 7.6 6.1  NEUTROABS 5.4  --   --   HGB 11.5* 11.8* 10.9*  HCT 34.6* 35.4* 32.9*  MCV 99.3 99.7 99.4  PLT 190.0 214 201   Cardiac Enzymes:  Recent Labs Lab 07/16/14 2220 07/17/14 0300  CKTOTAL 137 130  CKMB 2.6 2.2  TROPONINI 0.19* 0.19*   BNP (last 3 results)  Recent Labs  05/27/14 0512 06/22/14 1140 07/16/14 1431  BNP 465.0* 447.7* 506.6*    ProBNP (last 3 results)  Recent Labs  12/23/13 0535 04/04/14 0742 07/16/14 1213  PROBNP 4564.0* 4874.0* 460.0*    CBG:  Recent Labs Lab 07/16/14 2132 07/17/14 0810  GLUCAP 225* 152*    Recent Results (from the past 240 hour(s))  MRSA PCR Screening     Status: None   Collection Time: 07/16/14  9:12 PM  Result Value Ref Range Status   MRSA by PCR NEGATIVE NEGATIVE Final    Comment:  The GeneXpert MRSA Assay (FDA approved for NASAL specimens only), is one component of a comprehensive MRSA colonization surveillance program. It is not intended to diagnose MRSA infection nor to guide or monitor treatment for MRSA infections.      Studies: Dg Chest Port 1 View  07/16/2014   CLINICAL DATA:  Extreme weakness with shortness of breath and bilateral leg swelling  EXAM: PORTABLE CHEST - 1 VIEW  COMPARISON:  06/28/2014  FINDINGS: Stable mild cardiac enlargement and cardiac pacer. Hazy bilateral attenuation in the lower third lung zones bilaterally, slightly less severe on the left when compared to the prior study and stable on the right. Mild diffuse interstitial prominence similar  to previous.  IMPRESSION: Similar appearance to prior study. Bibasilar opacities again suggesting pleural effusions superimposed on mild interstitial pulmonary edema.   Electronically Signed   By: Skipper Cliche M.D.   On: 07/16/2014 14:59    Scheduled Meds: . allopurinol  100 mg Oral BID  . amiodarone  200 mg Oral QHS  . apixaban  2.5 mg Oral BID  . dutasteride  0.5 mg Oral Daily  . ferrous sulfate  325 mg Oral QODAY  . insulin aspart  0-5 Units Subcutaneous QHS  . insulin aspart  0-9 Units Subcutaneous TID WC  . insulin glargine  9-10 Units Subcutaneous QHS  . isosorbide mononitrate  60 mg Oral Daily  . latanoprost  1 drop Both Eyes QHS  . metoprolol tartrate  25 mg Oral Daily  . multivitamin with minerals  1 tablet Oral Daily  . polyvinyl alcohol  1 drop Both Eyes BID  . potassium chloride SA  20 mEq Oral Daily  . sodium chloride  3 mL Intravenous Q12H  . sodium chloride  3 mL Intravenous Q12H  . tamsulosin  0.8 mg Oral QHS  . torsemide  80 mg Oral Daily   Continuous Infusions:   Active Problems:   CHF exacerbation   Leg weakness   Hyponatremia   Weakness    Time spent: >35 minutes     Kinnie Feil  [facility] Pager [phone]. If 7PM-7AM, please contact night-coverage at [email or web address], password [facility] 07/17/2014, 9:44 AM  LOS: 1 day

## 2014-07-17 NOTE — Progress Notes (Signed)
Patient ID: [name], male   DOB: 02/10/1919, 81 y.o.   MRN: [id number]   SUBJECTIVE: No complaints this morning.  No dyspnea.  Has not been out of bed.   Scheduled Meds: . allopurinol  100 mg Oral BID  . amiodarone  200 mg Oral QHS  . apixaban  2.5 mg Oral BID  . dutasteride  0.5 mg Oral Daily  . ferrous sulfate  325 mg Oral QODAY  . insulin aspart  0-5 Units Subcutaneous QHS  . insulin aspart  0-9 Units Subcutaneous TID WC  . insulin glargine  9-10 Units Subcutaneous QHS  . isosorbide mononitrate  60 mg Oral Daily  . lactulose  30 g Oral BID  . latanoprost  1 drop Both Eyes QHS  . metoprolol tartrate  25 mg Oral Daily  . multivitamin with minerals  1 tablet Oral Daily  . polyvinyl alcohol  1 drop Both Eyes BID  . potassium chloride SA  20 mEq Oral Daily  . sodium chloride  3 mL Intravenous Q12H  . sodium chloride  3 mL Intravenous Q12H  . tamsulosin  0.8 mg Oral QHS  . torsemide  80 mg Oral Daily   Continuous Infusions:  PRN Meds:.sodium chloride, acetaminophen **OR** acetaminophen, benzonatate, nitroGLYCERIN, polyethylene glycol, sodium chloride    Filed Vitals:   07/17/14 0600 07/17/14 0700 07/17/14 0817 07/17/14 0818  BP: 138/64 133/52  127/97  Pulse: 68 64 64 66  Temp:   97.7 F (36.5 C)   TempSrc:   Oral   Resp: 22 23 22 20   Height:      Weight:      SpO2: 91% 88% 96% 95%    Intake/Output Summary (Last 24 hours) at 07/17/14 1156 Last data filed at 07/17/14 0400  Gross per 24 hour  Intake      0 ml  Output   1100 ml  Net  -1100 ml    LABS: Basic Metabolic Panel:  Recent Labs  07/16/14 1431 07/17/14 0300  NA 132* 134*  K 4.4 3.8  CL 87* 90*  CO2 32 32  GLUCOSE 284* 231*  BUN 62* 60*  CREATININE 3.06* 2.77*  CALCIUM 9.4 9.2   Liver Function Tests:  Recent Labs  07/17/14 0300  AST 49*  ALT 40  ALKPHOS 49  BILITOT 0.8  PROT 6.3  ALBUMIN 3.0*   No results for input(s): LIPASE, AMYLASE in the last 72 hours. CBC:  Recent Labs  07/16/14 1213 07/16/14 1431 07/17/14 0300  WBC 7.4 7.6 6.1  NEUTROABS 5.4  --   --   HGB 11.5* 11.8* 10.9*  HCT 34.6* 35.4* 32.9*  MCV 99.3 99.7 99.4  PLT 190.0 214 201   Cardiac Enzymes:  Recent Labs  07/16/14 2220 07/17/14 0300 07/17/14 0835  CKTOTAL 137 130 139  CKMB 2.6 2.2 2.1  TROPONINI 0.19* 0.19* 0.20*   BNP: Invalid input(s): POCBNP D-Dimer: No results for input(s): DDIMER in the last 72 hours. Hemoglobin A1C: No results for input(s): HGBA1C in the last 72 hours. Fasting Lipid Panel: No results for input(s): CHOL, HDL, LDLCALC, TRIG, CHOLHDL, LDLDIRECT in the last 72 hours. Thyroid Function Tests:  Recent Labs  07/16/14 2220  TSH 1.050   Anemia Panel: No results for input(s): VITAMINB12, FOLATE, FERRITIN, TIBC, IRON, RETICCTPCT in the last 72 hours.  RADIOLOGY: Dg Chest 2 View  06/28/2014   CLINICAL DATA:  Occasional cough without fever, history of CHF and diabetes and atrial fibrillation, remote history of smoking.  EXAM: CHEST  2 VIEW  COMPARISON:  Portable chest x-ray of June 22, 2014  FINDINGS: The lungs are mildly hypoinflated. The interstitial markings have improved slightly since the previous study. There remain bilateral pleural effusions. The cardiac silhouette is mildly enlarged. The pulmonary vascularity is engorged but more distinct than on the previous study. The permanent pacemaker is in appropriate position radiographically. The bony structures exhibit no acute abnormalities.  IMPRESSION: Congestive heart failure with mild pulmonary interstitial edema and small to moderate-sized bilateral pleural effusions. There has been slight interval improvement since the previous study.   Electronically Signed   By: David  Martinique   On: 06/28/2014 10:19   Dg Chest Port 1 View  07/16/2014   CLINICAL DATA:  Extreme weakness with shortness of breath and bilateral leg swelling  EXAM: PORTABLE CHEST - 1 VIEW  COMPARISON:  06/28/2014  FINDINGS: Stable mild cardiac  enlargement and cardiac pacer. Hazy bilateral attenuation in the lower third lung zones bilaterally, slightly less severe on the left when compared to the prior study and stable on the right. Mild diffuse interstitial prominence similar to previous.  IMPRESSION: Similar appearance to prior study. Bibasilar opacities again suggesting pleural effusions superimposed on mild interstitial pulmonary edema.   Electronically Signed   By: Skipper Cliche M.D.   On: 07/16/2014 14:59   Dg Chest Portable 1 View  06/22/2014   CLINICAL DATA:  81 year old male with shortness of Breath. Initial encounter.  EXAM: PORTABLE CHEST - 1 VIEW  COMPARISON:  05/27/2014 and earlier.  FINDINGS: Portable AP semi upright view at 1101 hours. Continued widespread interstitial and veiling pulmonary opacity. Stable cardiac size and mediastinal contours. No pneumothorax. No air bronchograms identified. Visualized tracheal air column is within normal limits. Stable left chest cardiac pacemaker.  IMPRESSION: Pulmonary edema and bilateral pleural effusions with lung base atelectasis.   Electronically Signed   By: Genevie Ann M.D.   On: 06/22/2014 11:34    PHYSICAL EXAM General: NAD Neck: No JVD, no thyromegaly or thyroid nodule.  Lungs: Clear to auscultation bilaterally with normal respiratory effort. CV: Nondisplaced PMI.  Heart regular S1/S2, no S3/S4, 1/6 SEM RUSB.  No peripheral edema.  No carotid bruit.  Normal pedal pulses.  Abdomen: Soft, nontender, no hepatosplenomegaly, no distention.  Neurologic: Alert and oriented x 3.  Psych: Normal affect. Extremities: No clubbing or cyanosis.   TELEMETRY: Reviewed telemetry pt in NSR  ASSESSMENT AND PLAN: 81 yo with history of diastolic CHF, pulmonary hypertension, CKD, PAF, SSS s/p St Jude PPM presented with weakness/shakiness.  He was found to have elevated BUN/creatinine and elevated NH3 level.  1. Chronic diastolic CHF: EF 58-09% on last echo with PA systolic pressure 76 mmHg, moderate  RV dilation/mild RV systolic dysfunction.  Suspect pulmonary venous hypertension primarily. He does not appear volume overloaded on exam.  Creatinine is lower this morning.  - Continue torsemide 80 mg daily today, I do not think that his presentation was due to CHF exacerbation.  2. Weakness/shakiness: Concern for uremia, but BUN not that high.  Creatinine/BUN better today.  NH3 high at 83 when checked yesterday.  Part of the problem here could be cardiac cirrhosis from long-standing diastolic/RV failure.   - Add lactulose 30 g bid.  - Will get abdominal US to evaluate liver.  3. CKD: Stage IV.  Stable today, BUN/creatinine lower.  He would be a poor HD candidate.  4. Atrial fibrillation: Paroxysmal, in NSR today.  Continue Eliquis.  5. Troponin elevation: No chest  pain.  No ischemic evaluation at this time.  6. Needs to mobilize, work with PT.   Loralie Champagne 07/17/2014 12:03 PM

## 2014-07-17 NOTE — Evaluation (Signed)
Physical Therapy Evaluation Patient Details Name: KARY COLAIZZI MRN: [id number] DOB: Mar 30, 1919 Today's Date: 07/17/2014   History of Present Illness  81 yo male with CHF (EF 55-60%), Dm2, Arthritis, CKD stage 4, apparently c/o increasein leg weakness and inability to walk over the past 2-3 days. Pt recently had admission for CHF exacerbation. Pt states that weight has been relative stable, and denies lower ext edema. Pt notes that his legs ache, and denies sob beyond baseline or cp.  Clinical Impression  Pt admitted with above diagnosis. Pt currently with functional limitations due to the deficits listed below (see PT Problem List). Pt unsafe with ambulation currently as knees are bucking completely within 6 seconds of standing and pt lacks UE strength to recover. Recommend SNF though family may refuse this and if so will need education on safe transfers.  Pt will benefit from skilled PT to increase their independence and safety with mobility to allow discharge to the venue listed below.       Follow Up Recommendations SNF;Supervision/Assistance - 24 hour    Equipment Recommendations  None recommended by PT    Recommendations for Other Services OT consult     Precautions / Restrictions Precautions Precautions: Fall Precaution Comments: knees buckle Restrictions Weight Bearing Restrictions: No      Mobility  Bed Mobility Overal bed mobility: Needs Assistance Bed Mobility: Supine to Sit     Supine to sit: Supervision     General bed mobility comments: pt able to get self to EOB with supervision, rail, and increased time  Transfers Overall transfer level: Needs assistance Equipment used: Rolling walker (2 wheeled) Transfers: Sit to/from [facility] Sit to Stand: Min assist;+2 safety/equipment Stand pivot transfers: Mod assist;+2 physical assistance       General transfer comment: min A +2 for safety but able to power up on his own to RW. However, with  SPT, pt's knees buckled and mod A +2 required to control descent to chair. Upon second stand for exercises, pt also tolerated 6 seconds before bilateral kneed buckled and mod A +2 for safely sitting to chair  Ambulation/Gait             General Gait Details: unable today because LE muscle failure happens quickly and fully today, pt unable to hold self up with UE's on RW  Stairs            Wheelchair Mobility    Modified Rankin (Stroke Patients Only)       Balance Overall balance assessment: Needs assistance Sitting-balance support: No upper extremity supported;Feet supported Sitting balance-Leahy Scale: Good Sitting balance - Comments: pt with twitching in UE and LE's but able to maintain balance EOB   Standing balance support: Bilateral upper extremity supported;During functional activity Standing balance-Leahy Scale: Poor Standing balance comment: cannot maintain standing without UE support and even with it, cannot maintain standing when knees buckle                             Pertinent Vitals/Pain Pain Assessment: No/denies pain    Home Living Family/patient expects to be discharged to:: Private residence Living Arrangements: Children Available Help at Discharge: Personal care attendant;Available 24 hours/day;Family Type of Home: House Home Access: Stairs to enter Entrance Stairs-Rails: Right;Left;Can reach both Entrance Stairs-Number of Steps: 3 Home Layout: One level Home Equipment: Walker - 4 wheels;Shower seat      Prior Function Level of Independence: Needs assistance  Gait / Transfers Assistance Needed: had been independent with rollator until last hospitalization 2 wks ago but since then, his knees have been buckling and has been experiencing muscle jerks and caregiver has had to help him to sit quickly  ADL's / Homemaking Assistance Needed: Stepping in and out of shower needs assist, washing back needs assist  Comments: used to golf,  plays computer games, uses computer     Hand Dominance   Dominant Hand: Right    Extremity/Trunk Assessment   Upper Extremity Assessment: Generalized weakness;RUE deficits/detail;LUE deficits/detail RUE Deficits / Details: when pt begins to move into sitting EOB, he begins to have muscle twitching and jerking. He reports being unable to write recently because of this     LUE Deficits / Details: same as RUE   Lower Extremity Assessment: Generalized weakness;RLE deficits/detail;LLE deficits/detail RLE Deficits / Details: pt can hold quad contraction for only 6 sec before he experiences complete failure, this is in [place] or [place] with LAQ. Before failure, he tests at 4/5. Also experiences jerking of LE's with voluntary mvmt. Reports that it is the worst in the morning, gets slightly better through the day and becomes worse again at night when he is fatigued LLE Deficits / Details: same as RLE though can hold contraction 10 seconds before failure  Cervical / Trunk Assessment: Kyphotic  Communication   Communication: HOH;Other (comment) (did not have dentures in)  Cognition Arousal/Alertness: Awake/alert Behavior During Therapy: WFL for tasks assessed/performed Overall Cognitive Status: Within Functional Limits for tasks assessed                      General Comments General comments (skin integrity, edema, etc.): at this point, recommend keeping pt at transfer level at home or SNF (depending on family's desires) until neuromuscular control improves    Exercises General Exercises - Lower Extremity Quad Sets: AROM;Both;10 reps;Seated      Assessment/Plan    PT Assessment Patient needs continued PT services  PT Diagnosis Abnormality of gait;Difficulty walking;Generalized weakness   PT Problem List Decreased strength;Decreased activity tolerance;Decreased balance;Decreased mobility;Decreased coordination;Decreased knowledge of precautions;Cardiopulmonary status limiting  activity;Impaired tone  PT Treatment Interventions DME instruction;Balance training;Gait training;Stair training;Functional mobility training;Patient/family education;Therapeutic activities;Therapeutic exercise   PT Goals (Current goals can be found in the Care Plan section) Acute Rehab PT Goals Patient Stated Goal: go home PT Goal Formulation: With patient Time For Goal Achievement: 07/31/14 Potential to Achieve Goals: Fair    Frequency Min 3X/week   Barriers to discharge        Co-evaluation               End of Session Equipment Utilized During Treatment: Gait belt Activity Tolerance: Patient limited by fatigue Patient left: in chair;with call bell/phone within reach;with family/visitor present Nurse Communication: Mobility status         Time: 1203-1233 PT Time Calculation (min) (ACUTE ONLY): 30 min   Charges:   PT Evaluation $Initial PT Evaluation Tier I: 1 Procedure PT Treatments $Therapeutic Activity: 8-22 mins   PT G Codes:       Leighton Roach, PT  Acute Rehab Services  [phone]  Leighton Roach 07/17/2014, 2:38 PM

## 2014-07-18 DIAGNOSIS — I5032 Chronic diastolic (congestive) heart failure: Secondary | ICD-10-CM

## 2014-07-18 LAB — BASIC METABOLIC PANEL
Anion gap: 6 (ref 5–15)
BUN: 51 mg/dL — AB (ref 6–23)
CHLORIDE: 92 mmol/L — AB (ref 96–112)
CO2: 39 mmol/L — ABNORMAL HIGH (ref 19–32)
CREATININE: 2.52 mg/dL — AB (ref 0.50–1.35)
Calcium: 9.5 mg/dL (ref 8.4–10.5)
GFR calc non Af Amer: 20 mL/min — ABNORMAL LOW (ref >90)
GFR, EST AFRICAN AMERICAN: 24 mL/min — AB (ref >90)
GLUCOSE: 149 mg/dL — AB (ref 70–99)
Potassium: 3.7 mmol/L (ref 3.5–5.1)
SODIUM: 137 mmol/L (ref 135–145)

## 2014-07-18 LAB — GLUCOSE, CAPILLARY
Glucose-Capillary: 176 mg/dL — ABNORMAL HIGH (ref 70–99)
Glucose-Capillary: 163 mg/dL — ABNORMAL HIGH (ref 70–99)
Glucose-Capillary: 209 mg/dL — ABNORMAL HIGH (ref 70–99)

## 2014-07-18 MED ORDER — METOPROLOL TARTRATE 25 MG PO TABS
25.0000 mg | ORAL_TABLET | Freq: Two times a day (BID) | ORAL | Status: DC
  Administered 2014-07-19: 25 mg via ORAL
  Filled 2014-07-18 (×2): qty 1

## 2014-07-18 NOTE — Progress Notes (Signed)
[facility] PROGRESS NOTE  [name]:[id number] DOB: 11-Dec-1918 DOA: 07/16/2014 PCP: [name], MD  Assessment/Plan: 81 y/o Chronic CHF, Severe Pulmonary HTN, CKD stage IV, PAF on Eliquis/amiodarone, SSS s/p PPM, HTN, HLD, DM who presents to [facility] with leg weakness, difficulty walking,  DOE  1. Generalized weakness, DJD; neuro exam is non focal; PT/OT evaluation: SNF  -d/w patient, he wants rehab/SNF options; will consult SW  2. Chronic CHF, combined HF, severe pulmonary HTN with probable liver congestion due to CHF/R HF with elevated ammonia; Liver US: unremarkable;  -CXR: mild edema; resumed home diuretic regimen; added lactulose; will recheck ammonia in AM:  -Mild hypoxia in the setting of CHF/pulmonary HTN; will obtain desat study prior to discharge  -Patient with progressive heart failure with multiorgan dysfunction, liver congestion, progressive renal failure may be a candidate for palliative care in near future if agreed   3. PAF on Eliquis/amiodarone, SSS s/p PPM 4. DM cont insulin regimen, will monitor to avoid hypoglycemia with CKD   Code Status: full Family Communication: d/w patient (indicate person spoken with, relationship, and if by phone, the number) Disposition Plan: pend SNF eval    Consultants:  Cardiology   Procedures:  none  Antibiotics:  none (indicate start date, and stop date if known)  HPI/Subjective: alert  Objective: Filed Vitals:   07/18/14 0700  BP: 130/59  Pulse: 60  Temp:   Resp: 17    Intake/Output Summary (Last 24 hours) at 07/18/14 0833 Last data filed at 07/18/14 0300  Gross per 24 hour  Intake    360 ml  Output   1800 ml  Net  -1440 ml   Filed Weights   07/16/14 1418 07/17/14 0425 07/18/14 0400  Weight: 84.823 kg (187 lb) 83.4 kg (183 lb 13.8 oz) 82.5 kg (181 lb 14.1 oz)    Exam:   General:  alert  Cardiovascular: s1,s2 rrr  Respiratory: few crackles at bases   Abdomen: soft,  nt  Musculoskeletal: no leg edema   Data Reviewed: Basic Metabolic Panel:  Recent Labs Lab 07/13/14 1002 07/16/14 1213 07/16/14 1431 07/17/14 0300 07/18/14 0318  NA 131* 130* 132* 134* 137  K 3.2* 4.2 4.4 3.8 3.7  CL 85* 87* 87* 90* 92*  CO2 42* 37* 32 32 39*  GLUCOSE 310* 272* 284* 231* 149*  BUN 62* 67* 62* 60* 51*  CREATININE 2.96* 3.08* 3.06* 2.77* 2.52*  CALCIUM 9.6 9.6 9.4 9.2 9.5   Liver Function Tests:  Recent Labs Lab 07/17/14 0300  AST 49*  ALT 40  ALKPHOS 49  BILITOT 0.8  PROT 6.3  ALBUMIN 3.0*   No results for input(s): LIPASE, AMYLASE in the last 168 hours.  Recent Labs Lab 07/16/14 1719  AMMONIA 83*   CBC:  Recent Labs Lab 07/16/14 1213 07/16/14 1431 07/17/14 0300  WBC 7.4 7.6 6.1  NEUTROABS 5.4  --   --   HGB 11.5* 11.8* 10.9*  HCT 34.6* 35.4* 32.9*  MCV 99.3 99.7 99.4  PLT 190.0 214 201   Cardiac Enzymes:  Recent Labs Lab 07/16/14 2220 07/17/14 0300 07/17/14 0835  CKTOTAL 137 130 139  CKMB 2.6 2.2 2.1  TROPONINI 0.19* 0.19* 0.20*   BNP (last 3 results)  Recent Labs  05/27/14 0512 06/22/14 1140 07/16/14 1431  BNP 465.0* 447.7* 506.6*    ProBNP (last 3 results)  Recent Labs  12/23/13 0535 04/04/14 0742 07/16/14 1213  PROBNP 4564.0* 4874.0* 460.0*    CBG:  Recent Labs  Lab 07/16/14 2132 07/17/14 0810 07/17/14 1248 07/17/14 1606 07/17/14 2132  GLUCAP 225* 152* 208* 187* 209*    Recent Results (from the past 240 hour(s))  MRSA PCR Screening     Status: None   Collection Time: 07/16/14  9:12 PM  Result Value Ref Range Status   MRSA by PCR NEGATIVE NEGATIVE Final    Comment:        The GeneXpert MRSA Assay (FDA approved for NASAL specimens only), is one component of a comprehensive MRSA colonization surveillance program. It is not intended to diagnose MRSA infection nor to guide or monitor treatment for MRSA infections.      Studies: US Abdomen Complete  07/17/2014   CLINICAL DATA:  Elevated  liver enzymes, cirrhosis  EXAM: ULTRASOUND ABDOMEN COMPLETE  COMPARISON:  None.  FINDINGS: Gallbladder: No gallstones or wall thickening visualized. No sonographic Murphy sign noted.  Common bile duct: Diameter: 4 mm  Liver: No focal lesion identified. Within normal limits in parenchymal echogenicity.  IVC: No abnormality visualized.  Pancreas: Not visualized  Spleen: Size and appearance within normal limits.  Right Kidney: Length: 10 cm. Echogenicity within normal limits. No mass or hydronephrosis visualized.  Left Kidney: Length: 10 cm. Echogenicity within normal limits. No mass or hydronephrosis visualized.  Abdominal aorta: Not seen well. Mid to distal aorta obscured by bowel gas. No aneurysm seen proximally.  Other findings: Bilateral pleural effusions  IMPRESSION: The liver appears visually normal.  Study limited as described above.  Bilateral pleural effusions.   Electronically Signed   By: Skipper Cliche M.D.   On: 07/17/2014 18:18   Dg Chest Port 1 View  07/16/2014   CLINICAL DATA:  Extreme weakness with shortness of breath and bilateral leg swelling  EXAM: PORTABLE CHEST - 1 VIEW  COMPARISON:  06/28/2014  FINDINGS: Stable mild cardiac enlargement and cardiac pacer. Hazy bilateral attenuation in the lower third lung zones bilaterally, slightly less severe on the left when compared to the prior study and stable on the right. Mild diffuse interstitial prominence similar to previous.  IMPRESSION: Similar appearance to prior study. Bibasilar opacities again suggesting pleural effusions superimposed on mild interstitial pulmonary edema.   Electronically Signed   By: Skipper Cliche M.D.   On: 07/16/2014 14:59    Scheduled Meds: . allopurinol  100 mg Oral BID  . amiodarone  200 mg Oral QHS  . apixaban  2.5 mg Oral BID  . dutasteride  0.5 mg Oral Daily  . ferrous sulfate  325 mg Oral QODAY  . insulin aspart  0-5 Units Subcutaneous QHS  . insulin aspart  0-9 Units Subcutaneous TID WC  . insulin  glargine  9 Units Subcutaneous QHS  . isosorbide mononitrate  60 mg Oral Daily  . lactulose  30 g Oral BID  . latanoprost  1 drop Both Eyes QHS  . metoprolol tartrate  25 mg Oral Daily  . multivitamin with minerals  1 tablet Oral Daily  . polyvinyl alcohol  1 drop Both Eyes BID  . potassium chloride SA  20 mEq Oral Daily  . sodium chloride  3 mL Intravenous Q12H  . sodium chloride  3 mL Intravenous Q12H  . tamsulosin  0.8 mg Oral QHS  . torsemide  80 mg Oral Daily   Continuous Infusions:   Active Problems:   CHF exacerbation   Leg weakness   Hyponatremia   Weakness   Chronic diastolic CHF (congestive heart failure)   Cirrhosis    Time spent: >35  minutes     Kinnie Feil  [facility] Pager [phone]. If 7PM-7AM, please contact night-coverage at [email or web address], password [facility] 07/18/2014, 8:33 AM  LOS: 2 days

## 2014-07-18 NOTE — Progress Notes (Signed)
Patient ID: [name], male   DOB: [birth year], 81 y.o.   MRN: [id number]   SUBJECTIVE: Very weak yesterday with PT.  Lack of strength in legs.  Creatinine improving today.  Oxygen saturation ok during day but desaturates at night.   Scheduled Meds: . allopurinol  100 mg Oral BID  . amiodarone  200 mg Oral QHS  . apixaban  2.5 mg Oral BID  . dutasteride  0.5 mg Oral Daily  . ferrous sulfate  325 mg Oral QODAY  . insulin aspart  0-5 Units Subcutaneous QHS  . insulin aspart  0-9 Units Subcutaneous TID WC  . insulin glargine  9 Units Subcutaneous QHS  . isosorbide mononitrate  60 mg Oral Daily  . lactulose  30 g Oral BID  . latanoprost  1 drop Both Eyes QHS  . metoprolol tartrate  25 mg Oral BID  . multivitamin with minerals  1 tablet Oral Daily  . polyvinyl alcohol  1 drop Both Eyes BID  . potassium chloride SA  20 mEq Oral Daily  . sodium chloride  3 mL Intravenous Q12H  . sodium chloride  3 mL Intravenous Q12H  . tamsulosin  0.8 mg Oral QHS  . torsemide  80 mg Oral Daily   Continuous Infusions:  PRN Meds:.sodium chloride, acetaminophen **OR** acetaminophen, benzonatate, nitroGLYCERIN, polyethylene glycol, sodium chloride    Filed Vitals:   07/18/14 0500 07/18/14 0600 07/18/14 0700 07/18/14 0922  BP: 131/62 130/56 130/59 99/51  Pulse: 68 68 60 65  Temp:    98.4 F (36.9 C)  TempSrc:    Oral  Resp: 12 20 17 14   Height:      Weight:      SpO2: 100% 92% 99% 97%    Intake/Output Summary (Last 24 hours) at 07/18/14 1104 Last data filed at 07/18/14 0800  Gross per 24 hour  Intake    680 ml  Output   1500 ml  Net   -820 ml    LABS: Basic Metabolic Panel:  Recent Labs  07/17/14 0300 07/18/14 0318  NA 134* 137  K 3.8 3.7  CL 90* 92*  CO2 32 39*  GLUCOSE 231* 149*  BUN 60* 51*  CREATININE 2.77* 2.52*  CALCIUM 9.2 9.5   Liver Function Tests:  Recent Labs  07/17/14 0300  AST 49*  ALT 40  ALKPHOS 49  BILITOT 0.8  PROT 6.3  ALBUMIN 3.0*   No results  for input(s): LIPASE, AMYLASE in the last 72 hours. CBC:  Recent Labs  07/16/14 1213 07/16/14 1431 07/17/14 0300  WBC 7.4 7.6 6.1  NEUTROABS 5.4  --   --   HGB 11.5* 11.8* 10.9*  HCT 34.6* 35.4* 32.9*  MCV 99.3 99.7 99.4  PLT 190.0 214 201   Cardiac Enzymes:  Recent Labs  07/16/14 2220 07/17/14 0300 07/17/14 0835  CKTOTAL 137 130 139  CKMB 2.6 2.2 2.1  TROPONINI 0.19* 0.19* 0.20*   BNP: Invalid input(s): POCBNP D-Dimer: No results for input(s): DDIMER in the last 72 hours. Hemoglobin A1C: No results for input(s): HGBA1C in the last 72 hours. Fasting Lipid Panel: No results for input(s): CHOL, HDL, LDLCALC, TRIG, CHOLHDL, LDLDIRECT in the last 72 hours. Thyroid Function Tests:  Recent Labs  07/16/14 2220  TSH 1.050   Anemia Panel: No results for input(s): VITAMINB12, FOLATE, FERRITIN, TIBC, IRON, RETICCTPCT in the last 72 hours.  RADIOLOGY: Dg Chest 2 View  06/28/2014   CLINICAL DATA:  Occasional cough without fever, history of  CHF and diabetes and atrial fibrillation, remote history of smoking.  EXAM: CHEST  2 VIEW  COMPARISON:  Portable chest x-ray of June 22, 2014  FINDINGS: The lungs are mildly hypoinflated. The interstitial markings have improved slightly since the previous study. There remain bilateral pleural effusions. The cardiac silhouette is mildly enlarged. The pulmonary vascularity is engorged but more distinct than on the previous study. The permanent pacemaker is in appropriate position radiographically. The bony structures exhibit no acute abnormalities.  IMPRESSION: Congestive heart failure with mild pulmonary interstitial edema and small to moderate-sized bilateral pleural effusions. There has been slight interval improvement since the previous study.   Electronically Signed   By: David  Martinique   On: 06/28/2014 10:19   US Abdomen Complete  07/17/2014   CLINICAL DATA:  Elevated liver enzymes, cirrhosis  EXAM: ULTRASOUND ABDOMEN COMPLETE  COMPARISON:   None.  FINDINGS: Gallbladder: No gallstones or wall thickening visualized. No sonographic Murphy sign noted.  Common bile duct: Diameter: 4 mm  Liver: No focal lesion identified. Within normal limits in parenchymal echogenicity.  IVC: No abnormality visualized.  Pancreas: Not visualized  Spleen: Size and appearance within normal limits.  Right Kidney: Length: 10 cm. Echogenicity within normal limits. No mass or hydronephrosis visualized.  Left Kidney: Length: 10 cm. Echogenicity within normal limits. No mass or hydronephrosis visualized.  Abdominal aorta: Not seen well. Mid to distal aorta obscured by bowel gas. No aneurysm seen proximally.  Other findings: Bilateral pleural effusions  IMPRESSION: The liver appears visually normal.  Study limited as described above.  Bilateral pleural effusions.   Electronically Signed   By: Skipper Cliche M.D.   On: 07/17/2014 18:18   Dg Chest Port 1 View  07/16/2014   CLINICAL DATA:  Extreme weakness with shortness of breath and bilateral leg swelling  EXAM: PORTABLE CHEST - 1 VIEW  COMPARISON:  06/28/2014  FINDINGS: Stable mild cardiac enlargement and cardiac pacer. Hazy bilateral attenuation in the lower third lung zones bilaterally, slightly less severe on the left when compared to the prior study and stable on the right. Mild diffuse interstitial prominence similar to previous.  IMPRESSION: Similar appearance to prior study. Bibasilar opacities again suggesting pleural effusions superimposed on mild interstitial pulmonary edema.   Electronically Signed   By: Skipper Cliche M.D.   On: 07/16/2014 14:59   Dg Chest Portable 1 View  06/22/2014   CLINICAL DATA:  81 year old male with shortness of Breath. Initial encounter.  EXAM: PORTABLE CHEST - 1 VIEW  COMPARISON:  05/27/2014 and earlier.  FINDINGS: Portable AP semi upright view at 1101 hours. Continued widespread interstitial and veiling pulmonary opacity. Stable cardiac size and mediastinal contours. No pneumothorax. No  air bronchograms identified. Visualized tracheal air column is within normal limits. Stable left chest cardiac pacemaker.  IMPRESSION: Pulmonary edema and bilateral pleural effusions with lung base atelectasis.   Electronically Signed   By: Genevie Ann M.D.   On: 06/22/2014 11:34    PHYSICAL EXAM General: NAD Neck: No JVD, no thyromegaly or thyroid nodule.  Lungs: Clear to auscultation bilaterally with normal respiratory effort. CV: Nondisplaced PMI.  Heart regular S1/S2, no S3/S4, 1/6 SEM RUSB.  No peripheral edema.  No carotid bruit.  Normal pedal pulses.  Abdomen: Soft, nontender, no hepatosplenomegaly, no distention.  Neurologic: Alert and oriented x 3.  Psych: Normal affect. Extremities: No clubbing or cyanosis.   TELEMETRY: Reviewed telemetry pt in NSR  ASSESSMENT AND PLAN: 81 yo with history of diastolic CHF, pulmonary hypertension,  CKD, PAF, SSS s/p [employer] PPM presented with weakness/shakiness.  He was found to have elevated BUN/creatinine and elevated NH3 level.  1. Chronic diastolic CHF: EF 48-25% on last echo with PA systolic pressure 76 mmHg, moderate RV dilation/mild RV systolic dysfunction.  Suspect pulmonary venous hypertension primarily. He does not appear volume overloaded on exam.  Creatinine is lower this morning.  - Continue torsemide 80 mg daily today, I do not think that his presentation was due to CHF exacerbation.  2. Weakness/shakiness: Initial concern for uremia, but BUN not that high.  Creatinine/BUN better today.  NH3 high at 83 when checked yesterday.  Part of the problem here could be cardiac cirrhosis from long-standing diastolic/RV failure.  Abdominal US 3/6 showed a normal-appearing liver.  He was profoundly weak with PT yesterday.  CPK level was normal.  - Continue lactulose 30 g bid, recheck NH3 level in am.  - Ongoing work with PT needed.  Currently unable to walk, suspect he is going to need a rehab stay.   3. CKD: Stage IV.  Stable today, BUN/creatinine lower.   He would be a poor HD candidate.  4. Atrial fibrillation: Paroxysmal, in NSR today.  Continue Eliquis.  5. Troponin elevation: No chest pain.  No ischemic evaluation at this time.   Loralie Champagne 07/18/2014 11:04 AM

## 2014-07-19 LAB — CBC
HEMATOCRIT: 33 % — AB (ref 39.0–52.0)
Hemoglobin: 10.8 g/dL — ABNORMAL LOW (ref 13.0–17.0)
MCH: 32.5 pg (ref 26.0–34.0)
MCHC: 32.7 g/dL (ref 30.0–36.0)
MCV: 99.4 fL (ref 78.0–100.0)
PLATELETS: 191 10*3/uL (ref 150–400)
RBC: 3.32 MIL/uL — ABNORMAL LOW (ref 4.22–5.81)
RDW: 15.1 % (ref 11.5–15.5)
WBC: 6.1 10*3/uL (ref 4.0–10.5)

## 2014-07-19 LAB — ANTINUCLEAR ANTIBODIES, IFA

## 2014-07-19 LAB — COMPREHENSIVE METABOLIC PANEL
ALBUMIN: 2.8 g/dL — AB (ref 3.5–5.2)
ALT: 42 U/L (ref 0–53)
ANION GAP: 8 (ref 5–15)
AST: 52 U/L — ABNORMAL HIGH (ref 0–37)
Alkaline Phosphatase: 52 U/L (ref 39–117)
BUN: 46 mg/dL — ABNORMAL HIGH (ref 6–23)
CALCIUM: 9.1 mg/dL (ref 8.4–10.5)
CO2: 36 mmol/L — AB (ref 19–32)
CREATININE: 2.38 mg/dL — AB (ref 0.50–1.35)
Chloride: 92 mmol/L — ABNORMAL LOW (ref 96–112)
GFR calc Af Amer: 25 mL/min — ABNORMAL LOW (ref >90)
GFR, EST NON AFRICAN AMERICAN: 22 mL/min — AB (ref >90)
Glucose, Bld: 144 mg/dL — ABNORMAL HIGH (ref 70–99)
Potassium: 3.1 mmol/L — ABNORMAL LOW (ref 3.5–5.1)
Sodium: 136 mmol/L (ref 135–145)
Total Bilirubin: 0.7 mg/dL (ref 0.3–1.2)
Total Protein: 6 g/dL (ref 6.0–8.3)

## 2014-07-19 LAB — HEMOGLOBIN A1C
Hgb A1c MFr Bld: 8.2 % — ABNORMAL HIGH (ref 4.8–5.6)
MEAN PLASMA GLUCOSE: 189 mg/dL

## 2014-07-19 LAB — FANA STAINING PATTERNS: CENTROMERE AB: 1:320 {titer} — ABNORMAL HIGH

## 2014-07-19 LAB — GLUCOSE, CAPILLARY
Glucose-Capillary: 160 mg/dL — ABNORMAL HIGH (ref 70–99)
Glucose-Capillary: 130 mg/dL — ABNORMAL HIGH (ref 70–99)

## 2014-07-19 LAB — AMMONIA: Ammonia: 30 umol/L (ref 11–32)

## 2014-07-19 MED ORDER — POTASSIUM CHLORIDE CRYS ER 20 MEQ PO TBCR: 40.0000 meq | EXTENDED_RELEASE_TABLET | Freq: Every day | ORAL | Status: DC

## 2014-07-19 NOTE — Care Management Note (Signed)
    Page 1 of 1   07/19/2014     10:30:18 AM CARE MANAGEMENT NOTE 07/19/2014  Patient:  GABRIEN, MENTINK   Account Number:  [id number]  Date Initiated:  07/19/2014  Documentation initiated by:  Elissa Hefty  Subjective/Objective Assessment:   adm w Elvia Collum     Action/Plan:   lives at home, pcp dr Jeanann Lewandowsky   Anticipated DC Date:  07/19/2014   Anticipated DC Plan:  [place]  CM consult      [place]   Choice offered to / List presented to:  C-1 Patient        [place] arranged  [place].   Status of service:   Medicare Important Message given?  YES (If response is "NO", the following Medicare IM given date fields will be blank) Date Medicare IM given:  07/19/2014 Medicare IM given by:  Elissa Hefty Date Additional Medicare IM given:   Additional Medicare IM given by:    Discharge Disposition:  [place]  Per UR Regulation:  Reviewed for med. necessity/level of care/duration of stay  If discussed at [place] of Stay Meetings, dates discussed:    Comments:  3/7 1029a debbie Climmie Cronce rn,bsn spoke w pt and he uses [facility]. ref to debbie w [facility] for hhrn and hhpt. pt hopes for dc today.

## 2014-07-19 NOTE — Progress Notes (Signed)
Patient discharged per orders. All d/c orders/instructions/follow up appointments/medications/prescriptions/home care discussed. Time allowed for questions and concerns. Patient states he has no questions or concerns at this time. Leg bag placed for patient due to chronic indwelling foley use. Patient denies pain or discomfort at this time. Patient's caregiver to transport home. Roselyn Reef Murray Durrell,RN

## 2014-07-19 NOTE — Evaluation (Signed)
Occupational Therapy Evaluation Patient Details Name: [name] MRN: [id number] DOB: 05-14-1919 Today's Date: 07/19/2014    History of Present Illness 81 yo male with CHF (EF 55-60%), Dm2, Arthritis, CKD stage 4, apparently c/o increasein leg weakness and inability to walk over the past 2-3 days. Pt recently had admission for CHF exacerbation. Pt states that weight has been relative stable, and denies lower ext edema. Pt notes that his legs ache, and denies sob beyond baseline or cp.   Clinical Impression   Pt is assisted for bathing and dressing at baseline, primarily for back and LEs.  Pt sits on his rollator to groom and performs toileting at a supervision level. He is dependent in IADL. Pt and caregiver report pt being much better than upon admission and are eager for pt to return home as they perceive him as near his baseline and that they can manage him at home. Instructed in home safety. If pt chooses to go home, will need very close supervision for mobility and standing activities as pt demonstrates decreased LE strength and risk for knees buckling. Will follow acutely.    Follow Up Recommendations  SNF;Supervision/Assistance - 24 hour (Pt prefers to go home.)    Equipment Recommendations       Recommendations for Other Services       Precautions / Restrictions Precautions Precautions: Fall Precaution Comments: knees buckle Restrictions Weight Bearing Restrictions: No      Mobility Bed Mobility                  Transfers Overall transfer level: Needs assistance Equipment used: Rolling walker (2 wheeled) Transfers: Sit to/from Stand Sit to Stand: Min guard Stand pivot transfers: +2 physical assistance;Min guard            Balance     Sitting balance-Leahy Scale: Good       Standing balance-Leahy Scale: Poor                              ADL Overall ADL's : Needs assistance/impaired Eating/Feeding: Independent;Sitting    Grooming: Brushing hair;Wash/dry hands;Wash/dry face;Set up;Sitting (shaving)   Upper Body Bathing: Minimal assitance;Sitting   Lower Body Bathing: Total assistance;Sit to/from stand   Upper Body Dressing : Minimal assistance;Sitting   Lower Body Dressing: Total assistance;Sit to/from stand       Toileting- Water quality scientist and Hygiene: Total assistance;Sitting/lateral lean         General ADL Comments: Pt has AE at home, prefers reliance on caregiver. Pt able to release walker with one hand in standing, but not both hands. Stood x 45 seconds before fatiguing and requiring return to chair.     Vision     Perception     Praxis      Pertinent Vitals/Pain Pain Assessment: No/denies pain     Hand Dominance Right   Extremity/Trunk Assessment Upper Extremity Assessment Upper Extremity Assessment: RUE deficits/detail;LUE deficits/detail RUE Deficits / Details: 4-/5 shoulder FF, 4+/5 elbow to hand LUE Deficits / Details: hx of rotator cuff issues, shoulder FF 3+/5, elbow to hand 4+/5   Lower Extremity Assessment Lower Extremity Assessment: Defer to PT evaluation   Cervical / Trunk Assessment Cervical / Trunk Assessment: Kyphotic   Communication Communication Communication: HOH (wears hearing aid)   Cognition Arousal/Alertness: Awake/alert Behavior During Therapy: WFL for tasks assessed/performed Overall Cognitive Status: Within Functional Limits for tasks assessed  General Comments       Exercises       Shoulder Instructions      Home Living Family/patient expects to be discharged to:: Private residence Living Arrangements: Children Available Help at Discharge: Personal care attendant;Available 24 hours/day;Family Type of Home: House Home Access: Stairs to enter [employer] of Steps: 3 Entrance Stairs-Rails: Right;Left;Can reach both Home Layout: One level     Bathroom Shower/Tub: Engineer, site: Handicapped height Bathroom Accessibility: Yes How Accessible: Accessible via walker Home Equipment: [place] - 4 wheels;Tub bench;Hand held shower head;Walker - 2 wheels;Adaptive equipment Adaptive Equipment: Reacher;Sock aid;Long-handled shoe horn;Long-handled sponge        Prior Functioning/Environment Level of Independence: Needs assistance  Gait / Transfers Assistance Needed: had been independent with rollator until last hospitalization 2 wks ago but since then, his knees have been buckling and has been experiencing muscle jerks and caregiver has had to help him to sit quickly ADL's / Homemaking Assistance Needed: assisted for tub bench transfer, to wash feet and back, for LB dressing, meal prep and housekeeping. Pt routinely sits to perform grooming on his rollator.        OT Diagnosis: Generalized weakness   OT Problem List: Decreased activity tolerance;Impaired balance (sitting and/or standing);Decreased strength   OT Treatment/Interventions: Self-care/ADL training;DME and/or AE instruction;Patient/family education;Balance training    OT Goals(Current goals can be found in the care plan section) Acute Rehab OT Goals Patient Stated Goal: go home OT Goal Formulation: With patient Time For Goal Achievement: 08/02/14 Potential to Achieve Goals: Good  OT Frequency: Min 2X/week   Barriers to D/C:            Co-evaluation              End of Session    Activity Tolerance: Patient tolerated treatment well Patient left: in chair;with call bell/phone within reach;with family/visitor present   Time: 0929-1000 OT Time Calculation (min): 31 min Charges:  OT General Charges $OT Visit: 1 Procedure OT Evaluation $Initial OT Evaluation Tier I: 1 Procedure OT Treatments $Self Care/Home Management : 8-22 mins G-Codes:    Malka So 07/19/2014, 1:15 PM  [phone]

## 2014-07-19 NOTE — Discharge Summary (Signed)
Physician Discharge Summary  [name] BJY:[id number] DOB: [birth year] DOA: 07/16/2014  PCP: [name], MD  Admit date: 07/16/2014 Discharge date: 07/19/2014  Time spent: 45 minutes  Recommendations for Outpatient Follow-up:  1. F/u with cadiology   Discharge Condition: stable Diet recommendation: heart healthy  Discharge Diagnoses:  Active Problems:   CHF exacerbation   Leg weakness   Hyponatremia   Chronic diastolic CHF (congestive heart failure)   Cirrhosis   History of present illness:  81 yo male with CHF (EF 55-60%), Dm2, Arthritis, CKD stage 4, apparently c/o increasein leg weakness and inability to walk over the past 2-3 days. Pt recently had admission for CHF exacerbation. Pt states that weight has been relative stable, and denies lower ext edema. Pt notes that his legs ache, and denies sob beyond baseline or cp. Cardiology has seen pt, please see recommendations. They doubt that elevation is trop is cardiac related per their notes. ED requests admission for weakness, deconditioning, inability to walk and management of chf.   Hospital Course:  1. 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 Acute on Chronic CHF, combined HF, severe pulmonary HTN  -  EF 45-50% on last echo with PA systolic pressure 76 mmHg, moderate RV dilation/mild RV systolic dysfunction -CXR: mild edema; Cardiology managing-  resumed home diuretic regimen -pulse ox 98% on room air today  3. PAF on Eliquis/amiodarone, SSS s/p PPM  4. DM cont insulin regimen  5. Elevated ammonia level- uncertain significance, Abdominal US 3/6 showed a normal-appearing liver, given lactulose with improvement  6.  CKD 4- stable  7. Elevated TNI - no chest pain- no ischemic eval needed per cardiology  Procedures:  none  Consultations:  cardiology  Discharge Exam: Filed Weights   07/17/14 0425 07/18/14 0400 07/19/14 0500  Weight: 83.4 kg (183 lb 13.8 oz) 82.5 kg (181 lb 14.1 oz) 83.1 kg (183 lb 3.2 oz)   Filed Vitals:   07/19/14 1301  BP: 161/72  Pulse: 65  Temp: 98.1 F (36.7 C)  Resp: 27    General: AAO x 3, no distress Cardiovascular: RRR, no murmurs  Respiratory: clear to auscultation bilaterally GI: soft, non-tender, non-distended, bowel sound positive  Discharge Instructions You were cared for by a hospitalist during your hospital stay. If you have any questions about your discharge medications or the care you received while you were in the hospital after you are discharged, you can call the unit and asked to speak with the hospitalist on call if the hospitalist that took care of you is not available. Once you are discharged, your primary care physician will handle any further medical issues. Please note that NO REFILLS for any discharge medications will be authorized once you are discharged, as it is imperative that you return to your primary care physician (or establish a relationship with a primary care physician if you do not have one) for your aftercare needs so that they can reassess your need for medications and monitor your lab values.      Discharge Instructions    Diet - low sodium heart healthy    Complete by:  As directed  Discharge instructions    Complete by:  As directed   Have potassium level checked in 3-4 days.     Increase activity slowly    Complete by:  As directed             Medication List    TAKE these medications        allopurinol 100 MG tablet  Commonly known as:  ZYLOPRIM  Take 100 mg by mouth 2 (two) times daily.     amiodarone 200 MG tablet  Commonly known as:  PACERONE  Take 1 tablet (200 mg total) by mouth at bedtime.      apixaban 2.5 MG Tabs tablet  Commonly known as:  ELIQUIS  Take 1 tablet (2.5 mg total) by mouth 2 (two) times daily.     dutasteride 0.5 MG capsule  Commonly known as:  AVODART  Take 0.5 mg by mouth daily.     ferrous sulfate 325 (65 FE) MG tablet  Take 325 mg by mouth every other day.     insulin glargine 100 UNIT/ML injection  Commonly known as:  LANTUS  Inject 9-10 Units into the skin at bedtime.     isosorbide mononitrate 60 MG 24 hr tablet  Commonly known as:  IMDUR  Take 1 tablet (60 mg total) by mouth daily.     metoprolol tartrate 25 MG tablet  Commonly known as:  LOPRESSOR  Take 1 tablet (25 mg total) by mouth daily.     multivitamin with minerals Tabs tablet  Take 1 tablet by mouth daily. Diabetic pak by Petra Kuba from [facility]     nitroGLYCERIN 0.4 MG SL tablet  Commonly known as:  NITROSTAT  Place 1 tablet (0.4 mg total) under the tongue every 5 (five) minutes as needed for chest pain.     NOVOLOG FLEXPEN 100 UNIT/ML FlexPen  Generic drug:  insulin aspart  - Inject 2-9 Units into the skin 3 (three) times daily with meals. Sliding scale  - If at lunch gbc is between 159 do not give any. If 299 or above give 3 units. At bedtime it is always 9 units     polyethylene glycol packet  Commonly known as:  MIRALAX / GLYCOLAX  Take 17 g by mouth 2 (two) times daily as needed for mild constipation. For constipation     potassium chloride SA 20 MEQ tablet  Commonly known as:  K-DUR,KLOR-CON  Take 2 tablets (40 mEq total) by mouth daily.     REFRESH OP  Place 1 drop into both eyes 2 (two) times daily. Refresh Gel Opth     tamsulosin 0.4 MG Caps capsule  Commonly known as:  FLOMAX  Take 0.8 mg by mouth at bedtime.     torsemide 20 MG tablet  Commonly known as:  DEMADEX  Take 4 tablets (80 mg total) by mouth daily.     ZIOPTAN 0.0015 % Soln  Generic drug:  Tafluprost  Place 1 drop into both eyes at bedtime. Use at night       Allergies  Allergen Reactions  .  Statins Other (See Comments)    rhabdomyolisis  . Aggrenox [Aspirin-Dipyridamole Er] Other (See Comments)    Dizziness  . Carvedilol Other (See Comments)    Dizziness  . Claritin [Loratadine] Other (See Comments)    Dizziness & drowsiness   . Mucinex [Guaifenesin Er] Other (See Comments)    Dizziness, drowsiness  . Plavix [Clopidogrel Bisulfate] Other (See Comments)    Dizziness  .  Rapaflo [Silodosin] Other (See Comments)    Dizziness for about 30- 45  minutes  . Travatan Z [Travoprost] Other (See Comments)    Eye irritation      The results of significant diagnostics from this hospitalization (including imaging, microbiology, ancillary and laboratory) are listed below for reference.    Significant Diagnostic Studies: Dg Chest 2 View  06/28/2014   CLINICAL DATA:  Occasional cough without fever, history of CHF and diabetes and atrial fibrillation, remote history of smoking.  EXAM: CHEST  2 VIEW  COMPARISON:  Portable chest x-ray of June 22, 2014  FINDINGS: The lungs are mildly hypoinflated. The interstitial markings have improved slightly since the previous study. There remain bilateral pleural effusions. The cardiac silhouette is mildly enlarged. The pulmonary vascularity is engorged but more distinct than on the previous study. The permanent pacemaker is in appropriate position radiographically. The bony structures exhibit no acute abnormalities.  IMPRESSION: Congestive heart failure with mild pulmonary interstitial edema and small to moderate-sized bilateral pleural effusions. There has been slight interval improvement since the previous study.   Electronically Signed   By: David  Martinique   On: 06/28/2014 10:19   US Abdomen Complete  07/17/2014   CLINICAL DATA:  Elevated liver enzymes, cirrhosis  EXAM: ULTRASOUND ABDOMEN COMPLETE  COMPARISON:  None.  FINDINGS: Gallbladder: No gallstones or wall thickening visualized. No sonographic Murphy sign noted.  Common bile duct: Diameter: 4 mm   Liver: No focal lesion identified. Within normal limits in parenchymal echogenicity.  IVC: No abnormality visualized.  Pancreas: Not visualized  Spleen: Size and appearance within normal limits.  Right Kidney: Length: 10 cm. Echogenicity within normal limits. No mass or hydronephrosis visualized.  Left Kidney: Length: 10 cm. Echogenicity within normal limits. No mass or hydronephrosis visualized.  Abdominal aorta: Not seen well. Mid to distal aorta obscured by bowel gas. No aneurysm seen proximally.  Other findings: Bilateral pleural effusions  IMPRESSION: The liver appears visually normal.  Study limited as described above.  Bilateral pleural effusions.   Electronically Signed   By: Skipper Cliche M.D.   On: 07/17/2014 18:18   Dg Chest Port 1 View  07/16/2014   CLINICAL DATA:  Extreme weakness with shortness of breath and bilateral leg swelling  EXAM: PORTABLE CHEST - 1 VIEW  COMPARISON:  06/28/2014  FINDINGS: Stable mild cardiac enlargement and cardiac pacer. Hazy bilateral attenuation in the lower third lung zones bilaterally, slightly less severe on the left when compared to the prior study and stable on the right. Mild diffuse interstitial prominence similar to previous.  IMPRESSION: Similar appearance to prior study. Bibasilar opacities again suggesting pleural effusions superimposed on mild interstitial pulmonary edema.   Electronically Signed   By: Skipper Cliche M.D.   On: 07/16/2014 14:59   Dg Chest Portable 1 View  06/22/2014   CLINICAL DATA:  81 year old male with shortness of Breath. Initial encounter.  EXAM: PORTABLE CHEST - 1 VIEW  COMPARISON:  05/27/2014 and earlier.  FINDINGS: Portable AP semi upright view at 1101 hours. Continued widespread interstitial and veiling pulmonary opacity. Stable cardiac size and mediastinal contours. No pneumothorax. No air bronchograms identified. Visualized tracheal air column is within normal limits. Stable left chest cardiac pacemaker.  IMPRESSION: Pulmonary  edema and bilateral pleural effusions with lung base atelectasis.   Electronically Signed   By: Genevie Ann M.D.   On: 06/22/2014 11:34    Microbiology: Recent Results (from the past 240 hour(s))  MRSA PCR Screening  Status: None   Collection Time: 07/16/14  9:12 PM  Result Value Ref Range Status   MRSA by PCR NEGATIVE NEGATIVE Final    Comment:        The GeneXpert MRSA Assay (FDA approved for NASAL specimens only), is one component of a comprehensive MRSA colonization surveillance program. It is not intended to diagnose MRSA infection nor to guide or monitor treatment for MRSA infections.      Labs: Basic Metabolic Panel:  Recent Labs Lab 07/16/14 1213 07/16/14 1431 07/17/14 0300 07/18/14 0318 07/19/14 0335  NA 130* 132* 134* 137 136  K 4.2 4.4 3.8 3.7 3.1*  CL 87* 87* 90* 92* 92*  CO2 37* 32 32 39* 36*  GLUCOSE 272* 284* 231* 149* 144*  BUN 67* 62* 60* 51* 46*  CREATININE 3.08* 3.06* 2.77* 2.52* 2.38*  CALCIUM 9.6 9.4 9.2 9.5 9.1   Liver Function Tests:  Recent Labs Lab 07/17/14 0300 07/19/14 0335  AST 49* 52*  ALT 40 42  ALKPHOS 49 52  BILITOT 0.8 0.7  PROT 6.3 6.0  ALBUMIN 3.0* 2.8*   No results for input(s): LIPASE, AMYLASE in the last 168 hours.  Recent Labs Lab 07/16/14 1719 07/19/14 0335  AMMONIA 83* 30   CBC:  Recent Labs Lab 07/16/14 1213 07/16/14 1431 07/17/14 0300 07/19/14 0335  WBC 7.4 7.6 6.1 6.1  NEUTROABS 5.4  --   --   --   HGB 11.5* 11.8* 10.9* 10.8*  HCT 34.6* 35.4* 32.9* 33.0*  MCV 99.3 99.7 99.4 99.4  PLT 190.0 214 201 191   Cardiac Enzymes:  Recent Labs Lab 07/16/14 2220 07/17/14 0300 07/17/14 0835  CKTOTAL 137 130 139  CKMB 2.6 2.2 2.1  TROPONINI 0.19* 0.19* 0.20*   BNP: BNP (last 3 results)  Recent Labs  05/27/14 0512 06/22/14 1140 07/16/14 1431  BNP 465.0* 447.7* 506.6*    ProBNP (last 3 results)  Recent Labs  12/23/13 0535 04/04/14 0742 07/16/14 1213  PROBNP 4564.0* 4874.0* 460.0*     CBG:  Recent Labs Lab 07/18/14 0901 07/18/14 1242 07/18/14 1631 07/18/14 2158 07/19/14 0803  GLUCAP 176* 163* 160* 209* 130*       SignedDebbe Odea, MD [facility] 07/19/2014, 2:16 PM

## 2014-07-19 NOTE — Progress Notes (Signed)
CSW (Clinical Education officer, museum) notified that pt son requesting SNF. CSW visited pt room and spoke with pt both and pt son. Pt informed CSW he has been to rehab before and will not be going back. Pt feels he had adequate support and assistance at home. Pt is requesting he receive therapy at home. Pt son confirmed that he was in agreement to this plan. Pt and pt son had no further questions or concerns. RNCM notified of dc plan. CSW signing off.  [place], [place]

## 2014-07-19 NOTE — Progress Notes (Addendum)
Physical Therapy Treatment Patient Details Name: [name] MRN: [id number] DOB: August 31, 1918 Today's Date: 07/19/2014    History of Present Illness 81 yo male with CHF (EF 55-60%), Dm2, Arthritis, CKD stage 4, apparently c/o increasein leg weakness and inability to walk over the past 2-3 days. Pt recently had admission for CHF exacerbation. Pt states that weight has been relative stable, and denies lower ext edema. Pt notes that his legs ache, and denies sob beyond baseline or cp.    PT Comments    Pt with tremendous improvement. Known to me from previous hospitalization and now appears to be amb better than he has for several weeks. Agree pt can return home with caregiver.  Follow Up Recommendations  No PT follow up (Pt goes to office and works several days a week.)     Equipment Recommendations  None recommended by PT    Recommendations for Other Services       Precautions / Restrictions Precautions Precautions: Fall Precaution Comments: history of knees buckling Restrictions Weight Bearing Restrictions: No    Mobility  Bed Mobility               General bed mobility comments: Pt sitting in chair.  Transfers Overall transfer level: Needs assistance Equipment used: 4-wheeled walker Transfers: Sit to/from Stand Sit to Stand: Min guard       General transfer comment: Assist for safety  Ambulation/Gait Ambulation/Gait assistance: Min guard Ambulation Distance (Feet): 110 Feet (x 2) Assistive device: 4-wheeled walker Gait Pattern/deviations: Step-through pattern;Decreased step length - right;Decreased step length - left;Trunk flexed;Narrow base of support     General Gait Details: No knee buckling.    Stairs            Wheelchair Mobility    Modified Rankin (Stroke Patients Only)       Balance Overall balance assessment: Needs assistance Sitting-balance support: No upper extremity supported Sitting balance-Leahy Scale: Good      Standing balance support: Bilateral upper extremity supported Standing balance-Leahy Scale: Poor Standing balance comment: requires support of rollator                     Cognition Arousal/Alertness: Awake/alert Behavior During Therapy: WFL for tasks assessed/performed Overall Cognitive Status: Within Functional Limits for tasks assessed                      Exercises      General Comments        Pertinent Vitals/Pain Pain Assessment: No/denies pain    Home Living Family/patient expects to be discharged to:: Private residence Living Arrangements: Children Available Help at Discharge: Personal care attendant;Available 24 hours/day;Family Type of Home: House Home Access: Stairs to enter Entrance Stairs-Rails: Right;Left;Can reach both Home Layout: One level Home Equipment: Walker - 4 wheels;Tub bench;Hand held shower head;Walker - 2 wheels;Adaptive equipment      Prior Function Level of Independence: Needs assistance  Gait / Transfers Assistance Needed: had been independent with rollator until last hospitalization 2 wks ago but since then, his knees have been buckling and has been experiencing muscle jerks and caregiver has had to help him to sit quickly ADL's / Homemaking Assistance Needed: assisted for tub bench transfer, to wash feet and back, for LB dressing, meal prep and housekeeping. Pt routinely sits to perform grooming on his rollator.     PT Goals (current goals can now be found in the care plan section) Acute Rehab PT Goals Patient Stated Goal:  go home Progress towards PT goals: Progressing toward goals    Frequency       PT Plan Discharge plan needs to be updated    Co-evaluation             End of Session Equipment Utilized During Treatment: Gait belt Activity Tolerance: Patient tolerated treatment well Patient left: in chair;with call bell/phone within reach;with family/visitor present     Time: 7340-3709 PT Time Calculation  (min) (ACUTE ONLY): 15 min  Charges:  $Gait Training: 8-22 mins                    G Codes:      [name] 08/18/2014, 2:12 PM  [employer] PT [phone]

## 2014-07-21 ENCOUNTER — Ambulatory Visit (INDEPENDENT_AMBULATORY_CARE_PROVIDER_SITE_OTHER): Payer: Medicare Other | Admitting: Neurology

## 2014-07-21 ENCOUNTER — Encounter: Payer: Self-pay | Admitting: Neurology

## 2014-07-21 VITALS — BP 121/67 | HR 69 | Wt 180.4 lb

## 2014-07-21 DIAGNOSIS — G629 Polyneuropathy, unspecified: Secondary | ICD-10-CM

## 2014-07-21 DIAGNOSIS — E1142 Type 2 diabetes mellitus with diabetic polyneuropathy: Secondary | ICD-10-CM | POA: Diagnosis not present

## 2014-07-21 DIAGNOSIS — I208 Other forms of angina pectoris: Secondary | ICD-10-CM | POA: Diagnosis not present

## 2014-07-21 DIAGNOSIS — R269 Unspecified abnormalities of gait and mobility: Secondary | ICD-10-CM

## 2014-07-21 DIAGNOSIS — R278 Other lack of coordination: Secondary | ICD-10-CM

## 2014-07-21 DIAGNOSIS — R279 Unspecified lack of coordination: Secondary | ICD-10-CM | POA: Diagnosis not present

## 2014-07-21 DIAGNOSIS — M21372 Foot drop, left foot: Secondary | ICD-10-CM

## 2014-07-21 HISTORY — DX: Other lack of coordination: R27.8

## 2014-07-21 HISTORY — DX: Foot drop, left foot: M21.372

## 2014-07-21 NOTE — Progress Notes (Signed)
Reason for visit: Gait disorder  [name] is an 81 y.o. 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 He is felt to have a multifactorial issue associated with lumbosacral spondylosis, history of sciatica down the left leg, left total knee replacement, and foot drops, left greater than right associated with a diabetic peripheral neuropathy. The patient walks with a walker. He has recently been in the hospital for congestive heart failure. The patient was noted to have an elevation in the ammonia level. He has developed increasing problems with asterixis and possible polymyoclonus. When he is standing, he will bounce with the legs associated with the asterixis. The patient indicates that he has not fallen. He is having nocturnal leg cramps as well, he takes mustard for this. The patient uses a back brace for the low back pain which has helped. He denies any significant pain with the peripheral neuropathy. The patient wears an AFO brace on the left. He has a history of chronic renal insufficiency, and some hepatic dysfunction associated in part with the congestive heart failure. He was placed on Cymbalta previously, but this resulted in increased confusion, and he had to come off the medication.  Past Medical History  Diagnosis Date  . Hyperlipidemia   . Spinal stenosis   . Anemia   . Carotid artery disease     a. s/p prior carotid endarterectomy.  [name] PVD (peripheral vascular disease)   . Hyponatremia     Chronic  . CVA (cerebral infarction)     a. When asked to clarify patient says he was told thumb numbness may be TIA. He does not recall formal stroke. CT head results are only available through [facility], not in [facility].  . CKD (chronic kidney disease) stage 4, GFR 15-29 ml/min   . Long term (current) use of anticoagulants   . PAF (paroxysmal atrial fibrillation)     a. Asymptomatic. On Eliquis. CHADSVASC 7.    . Sick sinus syndrome     With DDD St. Jude PM, initially placed in 1993 by Dr. Nils Pyle. Device upgrade 10/2002 to DDD, by Dr. Rollene Fare complicated by bleeding. Subsequent gen change 2011.  [name] Hypertensive cardiovascular disease   . Chronic diastolic congestive heart failure     ECHO 05/20/12 LVEF estimated by 2D at 55-60%  . Arthritis   . Gait disorder   . Diabetic peripheral neuropathy associated with type 2 diabetes mellitus   . Diabetes mellitus     insulin dependent  . Elevated troponin I level 06/22/2014    a. 06/2014 felt due to demand ischemia.  . Chronic anticoagulation     For PAF, on Eliquis   . Urinary retention   . Pulmonary hypertension   . Foot drop, left 07/21/2014  . Asterixis 07/21/2014    Past Surgical History  Procedure Laterality Date  . Lumbar laminectomy  1995  . Back surgery    . Total knee arthroplasty Left   . Quadriceps tendon repair    . Cataract extraction    . Carotid endarterectomy    . Yag laser application Right 11/17/2374    Procedure: YAG LASER CAPSULOTOMY OF RIGHT EYE;  Surgeon: [name], MD;  Location: [facility];  Service: Ophthalmology;  Laterality: Right;  . Tonsillectomy    . Pacemaker insertion      DDD St. Jude PM, initially placed in 1993 by Dr. Nils Pyle. Device upgrade  10/2002 to DDD, by Dr. Rollene Fare complicated by bleeding.  . Carotid endarterectomy Right 2006  . Transurethral resection of prostate    . Cardioversion N/A 06/16/2013    Procedure: CARDIOVERSION BEDSIDE;  Surgeon: [name], MD;  Location: [place];  Service: Cardiovascular;  Laterality: N/A;  . Pacemaker generator change  12/09/2009    SJM Accent DR RF gen change by Dr [name]    Family History  Problem Relation Age of Onset  . Multiple myeloma Father   . Hypertension Mother     Social history:  reports that he quit smoking about 56 years ago. His smoking use included Pipe. He has never used smokeless tobacco. He reports that he does not drink alcohol or use  illicit drugs.    Allergies  Allergen Reactions  . Statins Other (See Comments)    rhabdomyolisis  . Aggrenox [Aspirin-Dipyridamole Er] Other (See Comments)    Dizziness  . Carvedilol Other (See Comments)    Dizziness  . Claritin [Loratadine] Other (See Comments)    Dizziness & drowsiness   . Cymbalta [Duloxetine Hcl]     Confusion   . Mucinex [Guaifenesin Er] Other (See Comments)    Dizziness, drowsiness  . Plavix [Clopidogrel Bisulfate] Other (See Comments)    Dizziness  . Rapaflo [Silodosin] Other (See Comments)    Dizziness for about 30- 45  minutes  . Travatan Z [Travoprost] Other (See Comments)    Eye irritation    Medications:  Prior to Admission medications   Medication Sig Start Date End Date Taking? Authorizing Provider  allopurinol (ZYLOPRIM) 100 MG tablet Take 100 mg by mouth 2 (two) times daily.    Yes Historical Provider, MD  amiodarone (PACERONE) 200 MG tablet Take 1 tablet (200 mg total) by mouth at bedtime. 01/01/14  Yes Ivan Anchors Love, PA-C  apixaban (ELIQUIS) 2.5 MG TABS tablet Take 1 tablet (2.5 mg total) by mouth 2 (two) times daily. 05/21/14  Yes Belva Crome, MD  dutasteride (AVODART) 0.5 MG capsule Take 0.5 mg by mouth daily.    Yes Historical Provider, MD  ferrous sulfate 325 (65 FE) MG tablet Take 325 mg by mouth every other day.    Yes Historical Provider, MD  insulin aspart (NOVOLOG FLEXPEN) 100 UNIT/ML FlexPen Inject 2-9 Units into the skin 3 (three) times daily with meals. Sliding scale If at lunch gbc is between 159 do not give any. If 299 or above give 3 units. At bedtime it is always 9 units   Yes Historical Provider, MD  insulin glargine (LANTUS) 100 UNIT/ML injection Inject 9-10 Units into the skin at bedtime.    Yes Historical Provider, MD  isosorbide mononitrate (IMDUR) 60 MG 24 hr tablet Take 1 tablet (60 mg total) by mouth daily. 07/14/14  Yes Belva Crome, MD  metoprolol tartrate (LOPRESSOR) 25 MG tablet Take 1 tablet (25 mg total) by mouth  daily. 06/21/14  Yes Belva Crome, MD  Multiple Vitamin (MULTIVITAMIN WITH MINERALS) TABS Take 1 tablet by mouth daily. Diabetic pak by Petra Kuba from [facility]   Yes Historical Provider, MD  nitroGLYCERIN (NITROSTAT) 0.4 MG SL tablet Place 1 tablet (0.4 mg total) under the tongue every 5 (five) minutes as needed for chest pain. 12/03/13  Yes Belva Crome, MD  polyethylene glycol [facility] / Floria Raveling) packet Take 17 g by mouth 2 (two) times daily as needed for mild constipation. For constipation   Yes Historical Provider, MD  Polyvinyl Alcohol-Povidone (REFRESH OP) Place 1  drop into both eyes 2 (two) times daily. Refresh Gel Opth   Yes Historical Provider, MD  potassium chloride SA (K-DUR,KLOR-CON) 20 MEQ tablet Take 2 tablets (40 mEq total) by mouth daily. 07/19/14  Yes Debbe Odea, MD  Tafluprost (ZIOPTAN) 0.0015 % SOLN Place 1 drop into both eyes at bedtime. Use at night   Yes Historical Provider, MD  tamsulosin (FLOMAX) 0.4 MG CAPS capsule Take 0.8 mg by mouth at bedtime.   Yes Historical Provider, MD  torsemide (DEMADEX) 20 MG tablet Take 4 tablets (80 mg total) by mouth daily. 07/03/14  Yes Eileen Stanford, PA-C    ROS:  Out of a complete 14 system review of symptoms, the patient complains only of the following symptoms, and all other reviewed systems are negative.  Weakness, tremors Achy muscles, muscle cramps, walking difficulties  Blood pressure 121/67, pulse 69, weight 180 lb 6.4 oz (81.829 kg).  Physical Exam  General: The patient is alert and cooperative at the time of the examination. The patient is moderately obese.  Skin: No significant peripheral edema is noted.   Neurologic Exam  Mental status: The patient is alert and oriented x 3 at the time of the examination. The patient has apparent normal recent and remote memory, with an apparently normal attention span and concentration ability.   Cranial nerves: Facial symmetry is present. Speech is normal, no aphasia or dysarthria  is noted. Extraocular movements are full. Visual fields are full.  Motor: The patient has good strength in all 4 extremities. The patient wears an AFO brace on the left.  Sensory examination: Soft touch sensation is symmetric on the face, arms, and legs.  Coordination: The patient has good finger-nose-finger and heel-to-shin bilaterally. The patient does have some difficulty performing heel-to-shin with the left leg. The patient has asterixis involving the arms and legs.  Gait and station: The patient requires assistance with standing. Once up, the patient will "bounce" associated with asterixis of the legs. He is unable to ambulate effectively.  Reflexes: Deep tendon reflexes are symmetric.   Assessment/Plan:  1. Gait disorder, multifactorial  2. Peripheral neuropathy, diabetic  3. Lumbosacral spondylosis  4. Mild metabolic encephalopathy, asterixis  The patient has developed asterixis that may put him at risk for falling. This correlates with the electrolyte disturbances, hyperammonemia, and chronic renal insufficiency. The patient is not having much discomfort in the legs associated with the neuropathy. He could not tolerate the Cymbalta. Given the renal impairment and pre-existing asterixis, I would not use gabapentin or Lyrica. The patient will need to be quite careful with walking. The walking issues are not treatable at this point. He will follow-up through this office if needed.  Jill Alexanders MD 07/21/2014 9:02 PM  [facility] [address] [place], [place] [address]  Phone [phone] Fax [phone]

## 2014-07-21 NOTE — Patient Instructions (Signed)

## 2014-07-23 ENCOUNTER — Encounter (HOSPITAL_COMMUNITY): Payer: Self-pay

## 2014-07-23 ENCOUNTER — Emergency Department (HOSPITAL_COMMUNITY): Payer: Medicare Other

## 2014-07-23 ENCOUNTER — Inpatient Hospital Stay (HOSPITAL_COMMUNITY)
Admission: EM | Admit: 2014-07-23 | Discharge: 2014-07-25 | DRG: 152 | Disposition: A | Discharge status: Home / Self Care | Payer: Medicare Other | Attending: Internal Medicine | Admitting: Internal Medicine

## 2014-07-23 DIAGNOSIS — R05 Cough: Secondary | ICD-10-CM | POA: Diagnosis present

## 2014-07-23 DIAGNOSIS — N189 Chronic kidney disease, unspecified: Secondary | ICD-10-CM

## 2014-07-23 DIAGNOSIS — D631 Anemia in chronic kidney disease: Secondary | ICD-10-CM | POA: Diagnosis present

## 2014-07-23 DIAGNOSIS — D509 Iron deficiency anemia, unspecified: Secondary | ICD-10-CM | POA: Diagnosis present

## 2014-07-23 DIAGNOSIS — I130 Hypertensive heart and chronic kidney disease with heart failure and stage 1 through stage 4 chronic kidney disease, or unspecified chronic kidney disease: Secondary | ICD-10-CM | POA: Diagnosis present

## 2014-07-23 DIAGNOSIS — R059 Cough, unspecified: Secondary | ICD-10-CM

## 2014-07-23 DIAGNOSIS — J9601 Acute respiratory failure with hypoxia: Secondary | ICD-10-CM | POA: Diagnosis present

## 2014-07-23 DIAGNOSIS — M109 Gout, unspecified: Secondary | ICD-10-CM | POA: Diagnosis present

## 2014-07-23 DIAGNOSIS — Z8673 Personal history of transient ischemic attack (TIA), and cerebral infarction without residual deficits: Secondary | ICD-10-CM | POA: Diagnosis not present

## 2014-07-23 DIAGNOSIS — N400 Enlarged prostate without lower urinary tract symptoms: Secondary | ICD-10-CM | POA: Diagnosis present

## 2014-07-23 DIAGNOSIS — Z87891 Personal history of nicotine dependence: Secondary | ICD-10-CM

## 2014-07-23 DIAGNOSIS — E119 Type 2 diabetes mellitus without complications: Secondary | ICD-10-CM | POA: Diagnosis not present

## 2014-07-23 DIAGNOSIS — I739 Peripheral vascular disease, unspecified: Secondary | ICD-10-CM | POA: Diagnosis present

## 2014-07-23 DIAGNOSIS — I509 Heart failure, unspecified: Secondary | ICD-10-CM | POA: Insufficient documentation

## 2014-07-23 DIAGNOSIS — I5032 Chronic diastolic (congestive) heart failure: Secondary | ICD-10-CM | POA: Diagnosis not present

## 2014-07-23 DIAGNOSIS — Z7901 Long term (current) use of anticoagulants: Secondary | ICD-10-CM

## 2014-07-23 DIAGNOSIS — E875 Hyperkalemia: Secondary | ICD-10-CM | POA: Diagnosis present

## 2014-07-23 DIAGNOSIS — E1142 Type 2 diabetes mellitus with diabetic polyneuropathy: Secondary | ICD-10-CM | POA: Diagnosis present

## 2014-07-23 DIAGNOSIS — Z950 Presence of cardiac pacemaker: Secondary | ICD-10-CM | POA: Diagnosis not present

## 2014-07-23 DIAGNOSIS — I480 Paroxysmal atrial fibrillation: Secondary | ICD-10-CM | POA: Diagnosis present

## 2014-07-23 DIAGNOSIS — Z794 Long term (current) use of insulin: Secondary | ICD-10-CM

## 2014-07-23 DIAGNOSIS — Z96652 Presence of left artificial knee joint: Secondary | ICD-10-CM | POA: Diagnosis present

## 2014-07-23 DIAGNOSIS — D638 Anemia in other chronic diseases classified elsewhere: Secondary | ICD-10-CM | POA: Diagnosis present

## 2014-07-23 DIAGNOSIS — M1990 Unspecified osteoarthritis, unspecified site: Secondary | ICD-10-CM | POA: Diagnosis present

## 2014-07-23 DIAGNOSIS — N179 Acute kidney failure, unspecified: Secondary | ICD-10-CM | POA: Diagnosis present

## 2014-07-23 DIAGNOSIS — I5042 Chronic combined systolic (congestive) and diastolic (congestive) heart failure: Secondary | ICD-10-CM | POA: Diagnosis present

## 2014-07-23 DIAGNOSIS — J029 Acute pharyngitis, unspecified: Secondary | ICD-10-CM | POA: Diagnosis not present

## 2014-07-23 DIAGNOSIS — Z79899 Other long term (current) drug therapy: Secondary | ICD-10-CM | POA: Diagnosis not present

## 2014-07-23 DIAGNOSIS — E785 Hyperlipidemia, unspecified: Secondary | ICD-10-CM | POA: Diagnosis present

## 2014-07-23 DIAGNOSIS — I248 Other forms of acute ischemic heart disease: Secondary | ICD-10-CM | POA: Diagnosis present

## 2014-07-23 DIAGNOSIS — N184 Chronic kidney disease, stage 4 (severe): Secondary | ICD-10-CM | POA: Diagnosis present

## 2014-07-23 DIAGNOSIS — I5033 Acute on chronic diastolic (congestive) heart failure: Secondary | ICD-10-CM

## 2014-07-23 LAB — RAPID STREP SCREEN (MED CTR MEBANE ONLY): Streptococcus, Group A Screen (Direct): NEGATIVE

## 2014-07-23 LAB — CBC
HCT: 37.4 % — ABNORMAL LOW (ref 39.0–52.0)
Hemoglobin: 12.2 g/dL — ABNORMAL LOW (ref 13.0–17.0)
MCH: 33.4 pg (ref 26.0–34.0)
MCHC: 32.6 g/dL (ref 30.0–36.0)
MCV: 102.5 fL — AB (ref 78.0–100.0)
Platelets: 194 10*3/uL (ref 150–400)
RBC: 3.65 MIL/uL — AB (ref 4.22–5.81)
RDW: 15.3 % (ref 11.5–15.5)
WBC: 7.6 10*3/uL (ref 4.0–10.5)

## 2014-07-23 LAB — BASIC METABOLIC PANEL
ANION GAP: 12 (ref 5–15)
BUN: 45 mg/dL — ABNORMAL HIGH (ref 6–23)
CALCIUM: 9.6 mg/dL (ref 8.4–10.5)
CO2: 29 mmol/L (ref 19–32)
Chloride: 96 mmol/L (ref 96–112)
Creatinine, Ser: 3 mg/dL — ABNORMAL HIGH (ref 0.50–1.35)
GFR calc Af Amer: 19 mL/min — ABNORMAL LOW (ref >90)
GFR, EST NON AFRICAN AMERICAN: 16 mL/min — AB (ref >90)
Glucose, Bld: 184 mg/dL — ABNORMAL HIGH (ref 70–99)
Potassium: 5.2 mmol/L — ABNORMAL HIGH (ref 3.5–5.1)
Sodium: 137 mmol/L (ref 135–145)

## 2014-07-23 LAB — I-STAT VENOUS BLOOD GAS, ED
ACID-BASE EXCESS: 6 mmol/L — AB (ref 0.0–2.0)
BICARBONATE: 33.1 meq/L — AB (ref 20.0–24.0)
O2 Saturation: 72 %
PO2 VEN: 40 mmHg (ref 30.0–45.0)
TCO2: 35 mmol/L (ref 0–100)
pCO2, Ven: 56.4 mmHg — ABNORMAL HIGH (ref 45.0–50.0)
pH, Ven: 7.376 — ABNORMAL HIGH (ref 7.250–7.300)

## 2014-07-23 LAB — BRAIN NATRIURETIC PEPTIDE: B NATRIURETIC PEPTIDE 5: 359.1 pg/mL — AB (ref 0.0–100.0)

## 2014-07-23 LAB — I-STAT TROPONIN, ED: Troponin i, poc: 0.18 ng/mL (ref 0.00–0.08)

## 2014-07-23 LAB — GLUCOSE, CAPILLARY: GLUCOSE-CAPILLARY: 161 mg/dL — AB (ref 70–99)

## 2014-07-23 MED ORDER — INSULIN ASPART 100 UNIT/ML ~~LOC~~ SOLN
0.0000 [IU] | Freq: Three times a day (TID) | SUBCUTANEOUS | Status: DC
  Administered 2014-07-24: 1 [IU] via SUBCUTANEOUS
  Administered 2014-07-24: 3 [IU] via SUBCUTANEOUS
  Administered 2014-07-24: 5 [IU] via SUBCUTANEOUS
  Administered 2014-07-25: 1 [IU] via SUBCUTANEOUS

## 2014-07-23 MED ORDER — ALLOPURINOL 100 MG PO TABS
100.0000 mg | ORAL_TABLET | Freq: Two times a day (BID) | ORAL | Status: DC
  Administered 2014-07-24 – 2014-07-25 (×4): 100 mg via ORAL
  Filled 2014-07-23 (×5): qty 1

## 2014-07-23 MED ORDER — IPRATROPIUM-ALBUTEROL 0.5-2.5 (3) MG/3ML IN SOLN
3.0000 mL | Freq: Once | RESPIRATORY_TRACT | Status: AC
  Administered 2014-07-23: 3 mL via RESPIRATORY_TRACT
  Filled 2014-07-23: qty 3

## 2014-07-23 MED ORDER — POLYETHYLENE GLYCOL 3350 17 G PO PACK
17.0000 g | PACK | Freq: Two times a day (BID) | ORAL | Status: DC | PRN
  Administered 2014-07-24: 17 g via ORAL
  Filled 2014-07-23 (×4): qty 1

## 2014-07-23 MED ORDER — FERROUS SULFATE 325 (65 FE) MG PO TABS
325.0000 mg | ORAL_TABLET | ORAL | Status: DC
  Administered 2014-07-25: 325 mg via ORAL
  Filled 2014-07-23: qty 1

## 2014-07-23 MED ORDER — INSULIN GLARGINE 100 UNIT/ML ~~LOC~~ SOLN
9.0000 [IU] | Freq: Every day | SUBCUTANEOUS | Status: DC
Start: 2014-07-23 — End: 2014-07-24
  Administered 2014-07-24: 9 [IU] via SUBCUTANEOUS
  Filled 2014-07-23 (×2): qty 0.1

## 2014-07-23 MED ORDER — ONDANSETRON HCL 4 MG PO TABS: 4.0000 mg | ORAL_TABLET | Freq: Four times a day (QID) | ORAL | Status: DC | PRN

## 2014-07-23 MED ORDER — ISOSORBIDE MONONITRATE ER 60 MG PO TB24
60.0000 mg | ORAL_TABLET | Freq: Every day | ORAL | Status: DC
  Administered 2014-07-24 – 2014-07-25 (×2): 60 mg via ORAL
  Filled 2014-07-23 (×2): qty 1

## 2014-07-23 MED ORDER — MORPHINE SULFATE 2 MG/ML IJ SOLN
2.0000 mg | Freq: Once | INTRAMUSCULAR | Status: AC
  Administered 2014-07-23: 2 mg via INTRAVENOUS
  Filled 2014-07-23: qty 1

## 2014-07-23 MED ORDER — TAMSULOSIN HCL 0.4 MG PO CAPS
0.8000 mg | ORAL_CAPSULE | Freq: Every day | ORAL | Status: DC
  Administered 2014-07-24 (×2): 0.8 mg via ORAL
  Filled 2014-07-23 (×3): qty 2

## 2014-07-23 MED ORDER — LATANOPROST 0.005 % OP SOLN
1.0000 [drp] | Freq: Every day | OPHTHALMIC | Status: DC
  Administered 2014-07-24: 1 [drp] via OPHTHALMIC
  Filled 2014-07-23: qty 2.5

## 2014-07-23 MED ORDER — ASPIRIN 81 MG PO CHEW
324.0000 mg | CHEWABLE_TABLET | Freq: Once | ORAL | Status: AC
  Administered 2014-07-23: 324 mg via ORAL
  Filled 2014-07-23: qty 4

## 2014-07-23 MED ORDER — MORPHINE SULFATE 2 MG/ML IJ SOLN: 2.0000 mg | Freq: Once | INTRAMUSCULAR | Status: DC

## 2014-07-23 MED ORDER — SACCHAROMYCES BOULARDII 250 MG PO CAPS
250.0000 mg | ORAL_CAPSULE | Freq: Two times a day (BID) | ORAL | Status: DC
  Administered 2014-07-24 – 2014-07-25 (×4): 250 mg via ORAL
  Filled 2014-07-23 (×5): qty 1

## 2014-07-23 MED ORDER — FUROSEMIDE 10 MG/ML IJ SOLN
40.0000 mg | Freq: Once | INTRAMUSCULAR | Status: AC
  Administered 2014-07-23: 40 mg via INTRAVENOUS
  Filled 2014-07-23: qty 4

## 2014-07-23 MED ORDER — ACETAMINOPHEN 325 MG PO TABS: 650.0000 mg | ORAL_TABLET | Freq: Four times a day (QID) | ORAL | Status: DC | PRN

## 2014-07-23 MED ORDER — NITROGLYCERIN 0.4 MG SL SUBL: 0.4000 mg | SUBLINGUAL_TABLET | SUBLINGUAL | Status: DC | PRN

## 2014-07-23 MED ORDER — AMIODARONE HCL 200 MG PO TABS
200.0000 mg | ORAL_TABLET | Freq: Every day | ORAL | Status: DC
  Administered 2014-07-24 (×2): 200 mg via ORAL
  Filled 2014-07-23 (×3): qty 1

## 2014-07-23 MED ORDER — DOXYCYCLINE HYCLATE 100 MG PO TABS
100.0000 mg | ORAL_TABLET | Freq: Two times a day (BID) | ORAL | Status: DC
  Administered 2014-07-24 – 2014-07-25 (×4): 100 mg via ORAL
  Filled 2014-07-23 (×5): qty 1

## 2014-07-23 MED ORDER — SODIUM CHLORIDE 0.9 % IJ SOLN
3.0000 mL | Freq: Two times a day (BID) | INTRAMUSCULAR | Status: DC
  Administered 2014-07-24 (×2): 3 mL via INTRAVENOUS

## 2014-07-23 MED ORDER — ONDANSETRON HCL 4 MG/2ML IJ SOLN: 4.0000 mg | Freq: Four times a day (QID) | INTRAMUSCULAR | Status: DC | PRN

## 2014-07-23 MED ORDER — ACETAMINOPHEN 650 MG RE SUPP: 650.0000 mg | Freq: Four times a day (QID) | RECTAL | Status: DC | PRN

## 2014-07-23 MED ORDER — METOPROLOL TARTRATE 25 MG PO TABS
25.0000 mg | ORAL_TABLET | Freq: Every day | ORAL | Status: DC
  Administered 2014-07-24 – 2014-07-25 (×2): 25 mg via ORAL
  Filled 2014-07-23 (×2): qty 1

## 2014-07-23 MED ORDER — TORSEMIDE 20 MG PO TABS
80.0000 mg | ORAL_TABLET | Freq: Every day | ORAL | Status: DC
  Administered 2014-07-24 – 2014-07-25 (×2): 80 mg via ORAL
  Filled 2014-07-23 (×2): qty 4

## 2014-07-23 MED ORDER — ADULT MULTIVITAMIN W/MINERALS CH
1.0000 | ORAL_TABLET | Freq: Every day | ORAL | Status: DC
  Administered 2014-07-24 – 2014-07-25 (×2): 1 via ORAL
  Filled 2014-07-23 (×2): qty 1

## 2014-07-23 MED ORDER — SODIUM CHLORIDE 0.9 % IJ SOLN
3.0000 mL | Freq: Two times a day (BID) | INTRAMUSCULAR | Status: DC
  Administered 2014-07-24 (×3): 3 mL via INTRAVENOUS

## 2014-07-23 MED ORDER — BENZONATATE 100 MG PO CAPS
100.0000 mg | ORAL_CAPSULE | Freq: Once | ORAL | Status: AC
Start: 2014-07-23 — End: 2014-07-23
  Administered 2014-07-23: 100 mg via ORAL
  Filled 2014-07-23: qty 1

## 2014-07-23 MED ORDER — DUTASTERIDE 0.5 MG PO CAPS
0.5000 mg | ORAL_CAPSULE | Freq: Every day | ORAL | Status: DC
  Administered 2014-07-24 – 2014-07-25 (×2): 0.5 mg via ORAL
  Filled 2014-07-23 (×2): qty 1

## 2014-07-23 MED ORDER — APIXABAN 2.5 MG PO TABS
2.5000 mg | ORAL_TABLET | Freq: Two times a day (BID) | ORAL | Status: DC
  Administered 2014-07-24 – 2014-07-25 (×4): 2.5 mg via ORAL
  Filled 2014-07-23 (×5): qty 1

## 2014-07-23 NOTE — ED Notes (Signed)
MD Wofford at the bedside.

## 2014-07-23 NOTE — ED Notes (Signed)
Phlebotomy at the bedside  

## 2014-07-23 NOTE — H&P (Addendum)
[facility] History and Physical  [name] DGU:[id number] DOB: 01/22/1919 DOA: 07/23/2014  Referring physician: ER physician. PCP: [name], MD   Chief Complaint: Severe cough.  HPI: [name] is a 81 y.o. male with history of CHF, paroxysmal atrial fibrillation, chronic kidney disease stage IV, diabetes mellitus type 2 who was recently admitted for generalized weakness was brought to the ER because of severe cough. In the ER patient was found to have a repetitive cough things spells. Chest x-ray was showing nothing acute with chronic changes. Patient was given 1 dose of Lasix and admitted for further management. Patient stated that he also has been having some occasional blood-tinged sputum noticed today. Denies any fever or chills. Denies any chest pain. Patient has not gained any weight from his baseline. Patient states that he has sore throat like feeling. On exam of his throat that is no obvious congestion.  Review of Systems: As presented in the history of presenting illness, rest negative.  Past Medical History  Diagnosis Date  . Hyperlipidemia   . Spinal stenosis   . Anemia   . Carotid artery disease     a. s/p prior carotid endarterectomy.  [name] PVD (peripheral vascular disease)   . Hyponatremia     Chronic  . CVA (cerebral infarction)     a. When asked to clarify patient says he was told thumb numbness may be TIA. He does not recall formal stroke. CT head results are only available through [facility], not in [facility].  . CKD (chronic kidney disease) stage 4, GFR 15-29 ml/min   . Long term (current) use of anticoagulants   . PAF (paroxysmal atrial fibrillation)     a. Asymptomatic. On Eliquis. CHADSVASC 7.  . Sick sinus syndrome     With DDD St. Jude PM, initially placed in 1993 by Dr. Nils Pyle. Device upgrade 10/2002 to DDD, by Dr. Rollene Fare complicated by bleeding. Subsequent gen change 2011.  [name] Hypertensive cardiovascular disease   . Chronic diastolic congestive  heart failure     ECHO 05/20/12 LVEF estimated by 2D at 55-60%  . Arthritis   . Gait disorder   . Diabetic peripheral neuropathy associated with type 2 diabetes mellitus   . Diabetes mellitus     insulin dependent  . Elevated troponin I level 06/22/2014    a. 06/2014 felt due to demand ischemia.  . Chronic anticoagulation     For PAF, on Eliquis   . Urinary retention   . Pulmonary hypertension   . Foot drop, left 07/21/2014  . Asterixis 07/21/2014   Past Surgical History  Procedure Laterality Date  . Lumbar laminectomy  1995  . Back surgery    . Total knee arthroplasty Left   . Quadriceps tendon repair    . Cataract extraction    . Carotid endarterectomy    . Yag laser application Right 9/56/3875    Procedure: YAG LASER CAPSULOTOMY OF RIGHT EYE;  Surgeon: [name], MD;  Location: [facility];  Service: Ophthalmology;  Laterality: Right;  . Tonsillectomy    . Pacemaker insertion      DDD St. Jude PM, initially placed in 1993 by Dr. Nils Pyle. Device upgrade 10/2002 to DDD, by Dr. Rollene Fare complicated by bleeding.  . Carotid endarterectomy Right 2006  . Transurethral resection of prostate    . Cardioversion N/A 06/16/2013    Procedure: CARDIOVERSION BEDSIDE;  Surgeon: [name], MD;  Location: [place];  Service: Cardiovascular;  Laterality: N/A;  .  Pacemaker generator change  12/09/2009    SJM Accent DR RF gen change by Dr [name]   Social History:  reports that he quit smoking about 56 years ago. His smoking use included Pipe. He has never used smokeless tobacco. He reports that he does not drink alcohol or use illicit drugs. Where does patient live at home. Can patient participate in ADLs? Yes.  Allergies  Allergen Reactions  . Statins Other (See Comments)    rhabdomyolisis  . Aggrenox [Aspirin-Dipyridamole Er] Other (See Comments)    Dizziness  . Carvedilol Other (See Comments)    Dizziness  . Claritin [Loratadine] Other (See Comments)    Dizziness & drowsiness   .  Cymbalta [Duloxetine Hcl]     Confusion   . Mucinex [Guaifenesin Er] Other (See Comments)    Dizziness, drowsiness  . Plavix [Clopidogrel Bisulfate] Other (See Comments)    Dizziness  . Rapaflo [Silodosin] Other (See Comments)    Dizziness for about 30- 45  minutes  . Travatan Z [Travoprost] Other (See Comments)    Eye irritation    Family History:  Family History  Problem Relation Age of Onset  . Multiple myeloma Father   . Hypertension Mother       Prior to Admission medications   Medication Sig Start Date End Date Taking? Authorizing Provider  allopurinol (ZYLOPRIM) 100 MG tablet Take 100 mg by mouth 2 (two) times daily.    Yes Historical Provider, MD  amiodarone (PACERONE) 200 MG tablet Take 1 tablet (200 mg total) by mouth at bedtime. 01/01/14  Yes Ivan Anchors Love, PA-C  apixaban (ELIQUIS) 2.5 MG TABS tablet Take 1 tablet (2.5 mg total) by mouth 2 (two) times daily. 05/21/14  Yes Belva Crome, MD  dutasteride (AVODART) 0.5 MG capsule Take 0.5 mg by mouth daily.    Yes Historical Provider, MD  ferrous sulfate 325 (65 FE) MG tablet Take 325 mg by mouth every other day.    Yes Historical Provider, MD  insulin aspart (NOVOLOG FLEXPEN) 100 UNIT/ML FlexPen Inject 2-9 Units into the skin 3 (three) times daily with meals. Sliding scale If at lunch gbc is between 159 do not give any. If 299 or above give 3 units. At bedtime it is always 9 units   Yes Historical Provider, MD  insulin glargine (LANTUS) 100 UNIT/ML injection Inject 9-10 Units into the skin at bedtime.    Yes Historical Provider, MD  isosorbide mononitrate (IMDUR) 60 MG 24 hr tablet Take 1 tablet (60 mg total) by mouth daily. 07/14/14  Yes Belva Crome, MD  metoprolol tartrate (LOPRESSOR) 25 MG tablet Take 1 tablet (25 mg total) by mouth daily. 06/21/14  Yes Belva Crome, MD  Multiple Vitamin (MULTIVITAMIN WITH MINERALS) TABS Take 1 tablet by mouth daily. Diabetic pak by Petra Kuba from [facility]   Yes Historical Provider, MD   nitroGLYCERIN (NITROSTAT) 0.4 MG SL tablet Place 1 tablet (0.4 mg total) under the tongue every 5 (five) minutes as needed for chest pain. 12/03/13  Yes Belva Crome, MD  polyethylene glycol [facility] / Floria Raveling) packet Take 17 g by mouth 2 (two) times daily as needed for mild constipation. For constipation   Yes Historical Provider, MD  Polyvinyl Alcohol-Povidone (REFRESH OP) Place 1 drop into both eyes 2 (two) times daily. Refresh Gel Opth   Yes Historical Provider, MD  potassium chloride SA (K-DUR,KLOR-CON) 20 MEQ tablet Take 2 tablets (40 mEq total) by mouth daily. 07/19/14  Yes Debbe Odea, MD  Tafluprost (ZIOPTAN) 0.0015 % SOLN Place 1 drop into both eyes at bedtime. Use at night   Yes Historical Provider, MD  tamsulosin (FLOMAX) 0.4 MG CAPS capsule Take 0.8 mg by mouth at bedtime.   Yes Historical Provider, MD  torsemide (DEMADEX) 20 MG tablet Take 4 tablets (80 mg total) by mouth daily. 07/03/14  Yes Eileen Stanford, PA-C    Physical Exam: Filed Vitals:   07/23/14 2000 07/23/14 2025 07/23/14 2030 07/23/14 2055  BP: 116/42 119/57 138/79 127/54  Pulse: 59 59 62 63  Temp:    98.2 F (36.8 C)  TempSrc:    Oral  Resp: _0 Height:    _1  (1.753 m)  Weight:    85.2 kg (187 lb 13.3 oz)  SpO2: 95% 97% 98% 98%     General:  Well built and nourished.  Eyes: Anicteric no pallor.  ENT: No discharge from the ears eyes nose and mouth.  Neck: No mass felt. No neck rigidity.  Cardiovascular: S1 and S2 heard.  Respiratory: No rhonchi or crepitations.  Abdomen: Soft nontender bowel sounds present.  Skin: No rash.  Musculoskeletal: No edema.  Psychiatric: Appears normal.  Neurologic: Alert awake oriented to time place and person. Moves all extremities.  Labs on Admission:  Basic Metabolic Panel:  Recent Labs Lab 07/17/14 0300 07/18/14 0318 07/19/14 0335 07/23/14 1827  NA 134* 137 136 137  K 3.8 3.7 3.1* 5.2*  CL 90* 92* 92* 96  CO2 32 39* 36* 29  GLUCOSE  231* 149* 144* 184*  BUN 60* 51* 46* 45*  CREATININE 2.77* 2.52* 2.38* 3.00*  CALCIUM 9.2 9.5 9.1 9.6   Liver Function Tests:  Recent Labs Lab 07/17/14 0300 07/19/14 0335  AST 49* 52*  ALT 40 42  ALKPHOS 49 52  BILITOT 0.8 0.7  PROT 6.3 6.0  ALBUMIN 3.0* 2.8*   No results for input(s): LIPASE, AMYLASE in the last 168 hours.  Recent Labs Lab 07/19/14 0335  AMMONIA 30   CBC:  Recent Labs Lab 07/17/14 0300 07/19/14 0335 07/23/14 1827  WBC 6.1 6.1 7.6  HGB 10.9* 10.8* 12.2*  HCT 32.9* 33.0* 37.4*  MCV 99.4 99.4 102.5*  PLT 201 191 194   Cardiac Enzymes:  Recent Labs Lab 07/17/14 0300 07/17/14 0835  CKTOTAL 130 139  CKMB 2.2 2.1  TROPONINI 0.19* 0.20*    BNP (last 3 results)  Recent Labs  06/22/14 1140 07/16/14 1431 07/23/14 1827  BNP 447.7* 506.6* 359.1*    ProBNP (last 3 results)  Recent Labs  12/23/13 0535 04/04/14 0742 07/16/14 1213  PROBNP 4564.0* 4874.0* 460.0*    CBG:  Recent Labs Lab 07/18/14 0901 07/18/14 1242 07/18/14 1631 07/18/14 2158 07/19/14 0803  GLUCAP 176* 163* 160* 209* 130*    Radiological Exams on Admission: Dg Chest Port 1 View  07/23/2014   CLINICAL DATA:  Acute onset of shortness of breath and cough. Initial encounter.  EXAM: PORTABLE CHEST - 1 VIEW  COMPARISON:  Chest radiograph from 07/16/2014  FINDINGS: The lungs are well-aerated. Bibasilar airspace opacities are similar in appearance to the prior study, concerning for mild persistent pulmonary edema. Previously noted pleural effusions are improved from the prior study. No pneumothorax is seen.  The cardiomediastinal silhouette is mildly enlarged. A pacemaker is seen overlying the left chest wall, with leads ending overlying the right atrium and right ventricle. No acute osseous abnormalities are seen.  IMPRESSION: 1. Bibasilar airspace opacities are similar in appearance to  the prior study, concerning for mild persistent pulmonary edema. Previously noted pleural  effusions are improved from the prior study. 2. Mild cardiomegaly.   Electronically Signed   By: Garald Balding M.D.   On: 07/23/2014 18:09    EKG: Independently reviewed. Paced rhythm.  Assessment/Plan Principal Problem:   Cough Active Problems:   Paroxysmal atrial fibrillation, DCCV 06/16/13   Chronic diastolic CHF (congestive heart failure)   Renal failure (ARF), acute on chronic   Diabetes mellitus type 2, controlled   1. Severe cough - given the persistent cough and sore throat like feeling with occasional blood-tinged sputum at this time we have ordered an influenza PCR and strep throat. I have place patient empirically on doxycycline. Closely follow clinically. Since patient is complaining of bilateral blood-tinged sputum and patient is on Apixaban closely follow for any worsening hypoxia. Check pro-calcitonin levels. 2. Chronic combined CHF with last EF measured was 45-50% with severe pulmonary hypertension - patient did receive 1 dose of Lasix in the ER. We will continue with patient's home dose of torsemide. As per the caregiver patient's weight is around 182 pounds which is at baseline. Closely follow intake output metabolic panel and daily weights. 3. Acute on chronic kidney disease stage IV with mild hyperkalemia - we will hold the patient's potassium supplements at this time. Patient is on torsemide. Closely follow metabolic panel and if there is any worsening of creatinine may have to hold diuresis. 4. Diabetes mellitus type 2 - continue home medications with close follow-up of CBGs. 5. Mildly elevated troponin - patient's troponin was elevated even previously on last admission last week. We will cycle troponins at this time. Patient is on Apixaban. 6. Paroxysmal atrial fibrillation - on Apixaban and amiodarone and metoprolol. Chads2 vasc score is 7. 7. History of gout on allopurinol.   DVT Prophylaxis on Apixaban.  Code Status: Full code.  Family Communication: Patient's  caretaker at the bedside.  Disposition Plan: Admit to inpatient.    Thatiana Renbarger N. [facility] Pager [phone].  If 7PM-7AM, please contact night-coverage [email or web address] Password [facility] 07/23/2014, 11:09 PM

## 2014-07-23 NOTE — ED Notes (Signed)
Resident at the bedside

## 2014-07-23 NOTE — ED Notes (Signed)
X-Ray at the bedside 

## 2014-07-23 NOTE — ED Provider Notes (Signed)
CSN: [id number]     Arrival date & time 07/23/14  1643 History   First MD Initiated Contact with Patient 07/23/14 1701     Chief Complaint  Patient presents with  . Shortness of Breath     (Consider location/radiation/quality/duration/timing/severity/associated sxs/prior Treatment) HPI   [name] is a 81 year old male with past medical history of CHF coronary artery disease, CKD paroxysmal A. fib  Comes in today with complaint of cough.  Was recently admitted to the hospital for an admission for CHF exacerbation with difficulty with ambulation he was discharged on Monday. After discharges have persistent cough no fevers and chest pain with the cough but not in between associated with some increased shortness of breath.  He has no home oxygen requirement he says no increased lower extremity edema or other symptoms.  Past Medical History  Diagnosis Date  . Hyperlipidemia   . Spinal stenosis   . Anemia   . Carotid artery disease     a. s/p prior carotid endarterectomy.  [name] PVD (peripheral vascular disease)   . Hyponatremia     Chronic  . CVA (cerebral infarction)     a. When asked to clarify patient says he was told thumb numbness may be TIA. He does not recall formal stroke. CT head results are only available through [facility], not in [facility].  . CKD (chronic kidney disease) stage 4, GFR 15-29 ml/min   . Long term (current) use of anticoagulants   . PAF (paroxysmal atrial fibrillation)     a. Asymptomatic. On Eliquis. CHADSVASC 7.  . Sick sinus syndrome     With DDD St. Jude PM, initially placed in 1993 by Dr. Nils Pyle. Device upgrade 10/2002 to DDD, by Dr. Rollene Fare complicated by bleeding. Subsequent gen change 2011.  [name] Hypertensive cardiovascular disease   . Chronic diastolic congestive heart failure     ECHO 05/20/12 LVEF estimated by 2D at 55-60%  . Arthritis   . Gait disorder   . Diabetic peripheral neuropathy associated with type 2 diabetes mellitus   . Diabetes mellitus     insulin  dependent  . Elevated troponin I level 06/22/2014    a. 06/2014 felt due to demand ischemia.  . Chronic anticoagulation     For PAF, on Eliquis   . Urinary retention   . Pulmonary hypertension   . Foot drop, left 07/21/2014  . Asterixis 07/21/2014   Past Surgical History  Procedure Laterality Date  . Lumbar laminectomy  1995  . Back surgery    . Total knee arthroplasty Left   . Quadriceps tendon repair    . Cataract extraction    . Carotid endarterectomy    . Yag laser application Right 1/69/6789    Procedure: YAG LASER CAPSULOTOMY OF RIGHT EYE;  Surgeon: [name], MD;  Location: [place];  Service: Ophthalmology;  Laterality: Right;  . Tonsillectomy    . Pacemaker insertion      DDD St. Jude PM, initially placed in 1993 by Dr. Nils Pyle. Device upgrade 10/2002 to DDD, by Dr. Rollene Fare complicated by bleeding.  . Carotid endarterectomy Right 2006  . Transurethral resection of prostate    . Cardioversion N/A 06/16/2013    Procedure: CARDIOVERSION BEDSIDE;  Surgeon: [name], MD;  Location: [place];  Service: Cardiovascular;  Laterality: N/A;  . Pacemaker generator change  12/09/2009    SJM Accent DR RF gen change by Dr [name]   Family History  Problem Relation Age of Onset  .  Multiple myeloma Father   . Hypertension Mother    History  Substance Use Topics  . Smoking status: Former Smoker -- 2 years    Types: Pipe    Quit date: 05/14/1958  . Smokeless tobacco: Never Used  . Alcohol Use: No     Comment: Cocktails occasionally    Review of Systems  Constitutional: Negative for fever and chills.  Eyes: Negative for redness.  Respiratory: Positive for cough and shortness of breath.   Cardiovascular: Positive for chest pain.  Gastrointestinal: Negative for nausea, vomiting, abdominal pain and diarrhea.  Genitourinary: Negative for dysuria.  Skin: Negative for rash.  Neurological: Negative for headaches.      Allergies  Statins; Aggrenox; Carvedilol;  Claritin; Cymbalta; Mucinex; Plavix; Rapaflo; and Travatan z  Home Medications   Prior to Admission medications   Medication Sig Start Date End Date Taking? Authorizing Provider  allopurinol (ZYLOPRIM) 100 MG tablet Take 100 mg by mouth 2 (two) times daily.     Historical Provider, MD  amiodarone (PACERONE) 200 MG tablet Take 1 tablet (200 mg total) by mouth at bedtime. 01/01/14   Bary Leriche, PA-C  apixaban (ELIQUIS) 2.5 MG TABS tablet Take 1 tablet (2.5 mg total) by mouth 2 (two) times daily. 05/21/14   Belva Crome, MD  dutasteride (AVODART) 0.5 MG capsule Take 0.5 mg by mouth daily.     Historical Provider, MD  ferrous sulfate 325 (65 FE) MG tablet Take 325 mg by mouth every other day.     Historical Provider, MD  insulin aspart (NOVOLOG FLEXPEN) 100 UNIT/ML FlexPen Inject 2-9 Units into the skin 3 (three) times daily with meals. Sliding scale If at lunch gbc is between 159 do not give any. If 299 or above give 3 units. At bedtime it is always 9 units    Historical Provider, MD  insulin glargine (LANTUS) 100 UNIT/ML injection Inject 9-10 Units into the skin at bedtime.     Historical Provider, MD  isosorbide mononitrate (IMDUR) 60 MG 24 hr tablet Take 1 tablet (60 mg total) by mouth daily. 07/14/14   Belva Crome, MD  metoprolol tartrate (LOPRESSOR) 25 MG tablet Take 1 tablet (25 mg total) by mouth daily. 06/21/14   Belva Crome, MD  Multiple Vitamin (MULTIVITAMIN WITH MINERALS) TABS Take 1 tablet by mouth daily. Diabetic pak by Petra Kuba from [place] Provider, MD  nitroGLYCERIN (NITROSTAT) 0.4 MG SL tablet Place 1 tablet (0.4 mg total) under the tongue every 5 (five) minutes as needed for chest pain. 12/03/13   Belva Crome, MD  polyethylene glycol [facility] Floria Raveling) packet Take 17 g by mouth 2 (two) times daily as needed for mild constipation. For constipation    Historical Provider, MD  Polyvinyl Alcohol-Povidone (REFRESH OP) Place 1 drop into both eyes 2 (two) times daily.  Refresh Gel Opth    Historical Provider, MD  potassium chloride SA (K-DUR,KLOR-CON) 20 MEQ tablet Take 2 tablets (40 mEq total) by mouth daily. 07/19/14   Debbe Odea, MD  Tafluprost (ZIOPTAN) 0.0015 % SOLN Place 1 drop into both eyes at bedtime. Use at night    Historical Provider, MD  tamsulosin (FLOMAX) 0.4 MG CAPS capsule Take 0.8 mg by mouth at bedtime.    Historical Provider, MD  torsemide (DEMADEX) 20 MG tablet Take 4 tablets (80 mg total) by mouth daily. 07/03/14   Eileen Stanford, PA-C   BP 115/56 mmHg  Pulse 66  Temp(Src) 98.9 F (37.2  C) (Oral)  Resp 15  SpO2 100% Physical Exam  Constitutional: No distress.  HENT:  Head: Normocephalic and atraumatic.  Eyes: EOM are normal. Pupils are equal, round, and reactive to light.  Neck: Normal range of motion. Neck supple.  Cardiovascular: Normal rate.   Pulmonary/Chest: He is in respiratory distress. He has wheezes.  Abdominal: Soft. There is no tenderness. There is no rebound.  Musculoskeletal: He exhibits edema (1+ bilat).  Skin: No rash noted. He is not diaphoretic.    ED Course  Procedures (including critical care time) Labs Review Labs Reviewed  [place], ED    Imaging Review No results found.   EKG Interpretation None      MDM   Final diagnoses:  None    This is a 81 year old male with past medical history of chronic kidney disease CHF coronary disease just in the hospital for hospitalization for CHF exacerbation and weakness  He comes in today with cough shortness of breath   On exam, exam satting well on 3 L nasal cannula he has no home oxygen.  not hypertensive his lungs withcrackles at the bases bilaterally he has 1+ pitting edema in the patella  concerned this cough could be secondary to CHF will obtain chest x-ray labs CBC BMP troponin BNP. Will attempt to titrate down oxygen continue monitoring in the emergency department  Labs  significant for elevated troponin which seems to be baseline with his previous troponins and BNP also a baseline chest x-ray possible interstitial edema.  have titrated down oxygen 1 L nasal canal sidewall 1 L nasal cannula.  Unsure the exact etiology of this patient's coughing will obtain a influenza and admit for CHF and monitoring.  Given the patient's's symptoms of inability to stop coughing and possibly the oxygen will admit him to medicine for observation.   Jarome Matin, MD 07/24/14 1165  Serita Grit, MD 07/24/14 [phone]

## 2014-07-23 NOTE — ED Notes (Signed)
Respiratory at the bedside

## 2014-07-23 NOTE — ED Notes (Signed)
Per EMS, Patient had a sudden onset of SOB when at home. Patient was recently in the hospital for CHF and had eight pounds of fluid removed. Patient has history of CHF, admission with high ammonia levels. Vitals per EMS: 138/68, 138 CBG, 97% on 4L with no history of Chronic Oxygen Use.

## 2014-07-23 NOTE — ED Notes (Signed)
Phlebotomy called for lab draw 

## 2014-07-24 DIAGNOSIS — I5042 Chronic combined systolic (congestive) and diastolic (congestive) heart failure: Secondary | ICD-10-CM

## 2014-07-24 DIAGNOSIS — I480 Paroxysmal atrial fibrillation: Secondary | ICD-10-CM

## 2014-07-24 LAB — PROCALCITONIN

## 2014-07-24 LAB — CBC WITH DIFFERENTIAL/PLATELET
Basophils Absolute: 0 10*3/uL (ref 0.0–0.1)
Basophils Relative: 0 % (ref 0–1)
Eosinophils Absolute: 0.1 10*3/uL (ref 0.0–0.7)
Eosinophils Relative: 1 % (ref 0–5)
HCT: 33.4 % — ABNORMAL LOW (ref 39.0–52.0)
Hemoglobin: 10.7 g/dL — ABNORMAL LOW (ref 13.0–17.0)
LYMPHS ABS: 1.5 10*3/uL (ref 0.7–4.0)
Lymphocytes Relative: 24 % (ref 12–46)
MCH: 31.8 pg (ref 26.0–34.0)
MCHC: 32 g/dL (ref 30.0–36.0)
MCV: 99.4 fL (ref 78.0–100.0)
MONO ABS: 0.5 10*3/uL (ref 0.1–1.0)
Monocytes Relative: 7 % (ref 3–12)
NEUTROS PCT: 68 % (ref 43–77)
Neutro Abs: 4.2 10*3/uL (ref 1.7–7.7)
Platelets: 170 10*3/uL (ref 150–400)
RBC: 3.36 MIL/uL — ABNORMAL LOW (ref 4.22–5.81)
RDW: 15.3 % (ref 11.5–15.5)
WBC: 6.3 10*3/uL (ref 4.0–10.5)

## 2014-07-24 LAB — GLUCOSE, CAPILLARY
GLUCOSE-CAPILLARY: 265 mg/dL — AB (ref 70–99)
Glucose-Capillary: 128 mg/dL — ABNORMAL HIGH (ref 70–99)
Glucose-Capillary: 241 mg/dL — ABNORMAL HIGH (ref 70–99)
Glucose-Capillary: 264 mg/dL — ABNORMAL HIGH (ref 70–99)

## 2014-07-24 LAB — COMPREHENSIVE METABOLIC PANEL
ALK PHOS: 50 U/L (ref 39–117)
ALT: 41 U/L (ref 0–53)
ANION GAP: 11 (ref 5–15)
AST: 47 U/L — ABNORMAL HIGH (ref 0–37)
Albumin: 3 g/dL — ABNORMAL LOW (ref 3.5–5.2)
BUN: 45 mg/dL — AB (ref 6–23)
CALCIUM: 9.4 mg/dL (ref 8.4–10.5)
CHLORIDE: 97 mmol/L (ref 96–112)
CO2: 30 mmol/L (ref 19–32)
Creatinine, Ser: 2.84 mg/dL — ABNORMAL HIGH (ref 0.50–1.35)
GFR calc non Af Amer: 18 mL/min — ABNORMAL LOW (ref >90)
GFR, EST AFRICAN AMERICAN: 20 mL/min — AB (ref >90)
Glucose, Bld: 136 mg/dL — ABNORMAL HIGH (ref 70–99)
Potassium: 4.2 mmol/L (ref 3.5–5.1)
SODIUM: 138 mmol/L (ref 135–145)
Total Bilirubin: 0.7 mg/dL (ref 0.3–1.2)
Total Protein: 6.3 g/dL (ref 6.0–8.3)

## 2014-07-24 LAB — INFLUENZA PANEL BY PCR (TYPE A & B)
H1N1 flu by pcr: NOT DETECTED
Influenza A By PCR: NEGATIVE
Influenza B By PCR: NEGATIVE

## 2014-07-24 LAB — TROPONIN I
Troponin I: 0.15 ng/mL — ABNORMAL HIGH (ref <0.031)
Troponin I: 0.16 ng/mL — ABNORMAL HIGH (ref <0.031)
Troponin I: 0.16 ng/mL — ABNORMAL HIGH (ref <0.031)

## 2014-07-24 MED ORDER — INSULIN GLARGINE 100 UNIT/ML ~~LOC~~ SOLN
9.0000 [IU] | Freq: Every day | SUBCUTANEOUS | Status: DC
  Administered 2014-07-24: 9 [IU] via SUBCUTANEOUS
  Filled 2014-07-24 (×2): qty 0.09

## 2014-07-24 NOTE — Progress Notes (Signed)
Blood noted in stool. Pt c/o constipation. miralax given.

## 2014-07-24 NOTE — Progress Notes (Addendum)
Patient ID: [name], male   DOB: Jun 25, 1918, 81 y.o.   MRN: [id number] [facility] PROGRESS NOTE  BARRIE WALE [id number] DOB: January 31, 1919 DOA: 08/01/14 PCP: [name], MD  Brief narrative:    81 y.o. male with history of CHF, paroxysmal atrial fibrillation, chronic kidney disease stage IV, diabetes mellitus type 2 who was recently admitted for generalized weakness was brought to the ER because of severe cough. In the ER patient was found to have a repetitive cough things spells. Chest x-ray was showing nothing acute with chronic changes. Patient was given 1 dose of Lasix and admitted for further management. Patient stated that he also has been having some occasional blood-tinged sputum noticed today. Denies any fever or chills. Denies any chest pain. Patient has not gained any weight from his baseline. Patient states that he has sore throat like feeling. On exam of his throat that is no obvious congestion.  Assessment/Plan:    Principal Problem:   Cough / acute respiratory failure with hypoxia / possible pneumonia - Oxygen saturation 93% on [place] oxygen support - Pt has cough and noted shortly after eating - SLP evaluated for possible aspiration but he did ok and he was placed on regular heart healthy diet - He is on doxycycline for possible pneumonia - Stable respiratory status   Active Problems:   Paroxysmal atrial fibrillation, DCCV 06/16/13 - CHADS2-vasc score 5 - On anticoagulation with Apixaban - Rate controlled with amiodarone and metoprolol 25 mg daily     Chronic diastolic and systolic CHF (congestive heart failure) / sick sinus syndrome  - Last 2 D ECHO 12/2013 with EF 45% - has St.Jude pacemaker for sick sinus syndrome  - Continue torsemide 80 mg daily     Renal failure (ARF), acute on chronic / CKD stage 4 - Creatinine 3.0 on 2014-08-01 and seems to be trending down, 2.84 this am    Hyperkalemia - seen on 3.11 but subsequently improved with torsemide  and now WNL    Diabetes mellitus type 2, controlled, with renal manifestations  - Continue Lantus 9 units at bedtime and SSI - CBG in past 24 hours: 128, 241, 264       Troponin elevation - Also seen on recent admission up to 0.2 and on this admission 0.16 - Likely demand ischemia from CKD - Last admission seen by cardio and no further ischemic work up indicated - No complaints of chest pain     Anemia of chronic disease / iron deficiency anemia  - Secondary to CKD - Hemoglobin 10.7,stable  - Continue ferrous sulfate supplementation     BPH - Continue Avodart and Flomax    DVT Prophylaxis  - on anticoagulation with apixaban   Code Status: Full.  Family Communication:  plan of care discussed with the patient and his family at the bedside  Disposition Plan: Home possibly in next 24 hours if he feels better.    IV access:  Peripheral IV  Procedures and diagnostic studies:    Dg Chest Port 1 View 01-Aug-2014  1. Bibasilar airspace opacities are similar in appearance to the prior study, concerning for mild persistent pulmonary edema. Previously noted pleural effusions are improved from the prior study. 2. Mild cardiomegaly.     Medical Consultants:  None   Other Consultants:  Physical therapy  IAnti-Infectives:   Doxycycline 01-Aug-2014 -->    Leisa Lenz, MD  [facility] Pager [phone]  If 7PM-7AM, please contact night-coverage [email or web address] Password [facility] 07/24/2014, 7:54 PM  LOS: 1 day    HPI/Subjective: No acute overnight events.  Objective: Filed Vitals:   07/23/14 2025 07/23/14 2030 07/23/14 2055 07/24/14 0522  BP: 119/57 138/79 127/54 121/66  Pulse: 59 62 63 66  Temp:   98.2 F (36.8 C) 98 F (36.7 C)  TempSrc:   Oral Axillary  Resp: 21 24 18 18   Height:   5\' 9"  (1.753 m)   Weight:   85.2 kg (187 lb 13.3 oz) 84.6 kg (186 lb 8.2 oz)  SpO2: 97% 98% 98% 98%    Intake/Output Summary (Last 24 hours) at 07/24/14 1954 Last data filed at  07/24/14 1304  Gross per 24 hour  Intake   1307 ml  Output   1825 ml  Net   -518 ml    Exam:   General:  Pt is alert, follows commands appropriately, not in acute distress  Cardiovascular: has pacer, appreciate S1/S2 (+)  Respiratory: Clear to auscultation bilaterally, no wheezing, no crackles, no rhonchi  Abdomen: Soft, non tender, non distended, bowel sounds present  Extremities: No edema, pulses DP and PT palpable bilaterally  Neuro: Grossly nonfocal  Data Reviewed: Basic Metabolic Panel:  Recent Labs Lab 07/18/14 0318 07/19/14 0335 07/23/14 1827 07/24/14 0502  NA 137 136 137 138  K 3.7 3.1* 5.2* 4.2  CL 92* 92* 96 97  CO2 39* 36* 29 30  GLUCOSE 149* 144* 184* 136*  BUN 51* 46* 45* 45*  CREATININE 2.52* 2.38* 3.00* 2.84*  CALCIUM 9.5 9.1 9.6 9.4   Liver Function Tests:  Recent Labs Lab 07/19/14 0335 07/24/14 0502  AST 52* 47*  ALT 42 41  ALKPHOS 52 50  BILITOT 0.7 0.7  PROT 6.0 6.3  ALBUMIN 2.8* 3.0*   No results for input(s): LIPASE, AMYLASE in the last 168 hours.  Recent Labs Lab 07/19/14 0335  AMMONIA 30   CBC:  Recent Labs Lab 07/19/14 0335 07/23/14 1827 07/24/14 0502  WBC 6.1 7.6 6.3  NEUTROABS  --   --  4.2  HGB 10.8* 12.2* 10.7*  HCT 33.0* 37.4* 33.4*  MCV 99.4 102.5* 99.4  PLT 191 194 170   Cardiac Enzymes:  Recent Labs Lab 07/23/14 2358 07/24/14 0502 07/24/14 1120  TROPONINI 0.15* 0.16* 0.16*   BNP: Invalid input(s): POCBNP CBG:  Recent Labs Lab 07/19/14 0803 07/23/14 2343 07/24/14 0610 07/24/14 1117 07/24/14 1654  GLUCAP 130* 161* 128* 241* 264*    Recent Results (from the past 240 hour(s))  MRSA PCR Screening     Status: None   Collection Time: 07/16/14  9:12 PM  Result Value Ref Range Status   MRSA by PCR NEGATIVE NEGATIVE Final  Rapid strep screen     Status: None   Collection Time: 07/23/14 10:39 PM  Result Value Ref Range Status   Streptococcus, Group A Screen (Direct) NEGATIVE NEGATIVE Final      Scheduled Meds: . allopurinol  100 mg Oral BID  . amiodarone  200 mg Oral QHS  . apixaban  2.5 mg Oral BID  . doxycycline  100 mg Oral Q12H  . dutasteride  0.5 mg Oral Daily  . ferrous sulfate  325 mg Oral QODAY  . insulin aspart  0-9 Units Subcutaneous TID WC  . insulin glargine  9 Units Subcutaneous QHS  . isosorbide mononitrate  60 mg Oral Daily  . metoprolol tartrate  25 mg Oral Daily  . multivitamin  1 tablet Oral Daily  . saccharomyces boulardii  250 mg Oral BID  . tamsulosin  0.8 mg Oral QHS  . torsemide  80 mg Oral Daily

## 2014-07-24 NOTE — Evaluation (Signed)
Clinical/Bedside Swallow Evaluation Patient Details  Name: [name] MRN: [id number] Date of Birth: Dec 15, 1918  Today's Date: 07/24/2014 Time: SLP Start Time (ACUTE ONLY): 1100 SLP Stop Time (ACUTE ONLY): 1139 SLP Time Calculation (min) (ACUTE ONLY): 39 min  Past Medical History:  Past Medical History  Diagnosis Date  . Hyperlipidemia   . Spinal stenosis   . Anemia   . Carotid artery disease     a. s/p prior carotid endarterectomy.  [name] PVD (peripheral vascular disease)   . Hyponatremia     Chronic  . CVA (cerebral infarction)     a. When asked to clarify patient says he was told thumb numbness may be TIA. He does not recall formal stroke. CT head results are only available through [facility], not in [facility].  . CKD (chronic kidney disease) stage 4, GFR 15-29 ml/min   . Long term (current) use of anticoagulants   . PAF (paroxysmal atrial fibrillation)     a. Asymptomatic. On Eliquis. CHADSVASC 7.  . Sick sinus syndrome     With DDD St. Jude PM, initially placed in 1993 by Dr. Nils Pyle. Device upgrade 10/2002 to DDD, by Dr. Rollene Fare complicated by bleeding. Subsequent gen change 2011.  [name] Hypertensive cardiovascular disease   . Chronic diastolic congestive heart failure     ECHO 05/20/12 LVEF estimated by 2D at 55-60%  . Arthritis   . Gait disorder   . Diabetic peripheral neuropathy associated with type 2 diabetes mellitus   . Diabetes mellitus     insulin dependent  . Elevated troponin I level 06/22/2014    a. 06/2014 felt due to demand ischemia.  . Chronic anticoagulation     For PAF, on Eliquis   . Urinary retention   . Pulmonary hypertension   . Foot drop, left 07/21/2014  . Asterixis 07/21/2014   Past Surgical History:  Past Surgical History  Procedure Laterality Date  . Lumbar laminectomy  1995  . Back surgery    . Total knee arthroplasty Left   . Quadriceps tendon repair    . Cataract extraction    . Carotid endarterectomy    . Yag laser application Right 08/20/8117   Procedure: YAG LASER CAPSULOTOMY OF RIGHT EYE;  Surgeon: [name], MD;  Location: [place];  Service: Ophthalmology;  Laterality: Right;  . Tonsillectomy    . Pacemaker insertion      DDD St. Jude PM, initially placed in 1993 by Dr. Nils Pyle. Device upgrade 10/2002 to DDD, by Dr. Rollene Fare complicated by bleeding.  . Carotid endarterectomy Right 2006  . Transurethral resection of prostate    . Cardioversion N/A 06/16/2013    Procedure: CARDIOVERSION BEDSIDE;  Surgeon: [name], MD;  Location: [place];  Service: Cardiovascular;  Laterality: N/A;  . Pacemaker generator change  12/09/2009    SJM Accent DR RF gen change by Dr [name]   HPI:  [name] admitted 07/23/14 due to weakness. PMH significant for DM, arthritis, ? TIA, CHF. BSE ordered to evaluate swallow function and safety given CXR results of BLL opacities concerning for persistent mild pulmonary edema, stable per previous study. RN reports no observed difficulty swallowing, lungs clear.   Assessment / Plan / Recommendation Clinical Impression  Oral motor strength and function appear adequate. No overt s/s aspiration observed with any consistency tested. RN reports observing no swallowing difficulty, and pt reports no difficulty swallowing prior to admit. If it is occurring, aspiration is completely silent. However, pt has  no high risk indicators raising suspicion for dysphagia, aside from possible TIA and advanced age. CXR indicates stable exam concerning for persistent pulmonary edema. Will defer to MD re: objective study via MBS. RN aware.    Aspiration Risk  Mild    Diet Recommendation Regular;Thin liquid   Liquid Administration via: Cup;Straw Medication Administration: Whole meds with liquid Supervision: Patient able to self feed Compensations: Slow rate;Small sips/bites;Follow solids with liquid Postural Changes and/or Swallow Maneuvers: Seated upright 90 degrees;Upright 30-60 min after meal    Other   Recommendations Oral Care Recommendations: Oral care BID Other Recommendations: Clarify dietary restrictions   Follow Up Recommendations  None    Frequency and Duration        Pertinent Vitals/Pain No pain reported    SLP Swallow Goals  n/a   Swallow Study Prior Functional Status   No history of swallowing difficulty prior to admit.    General Date of Onset: 07/23/14 HPI: [name] admitted 07/23/14 due to weakness. PMH significant for DM, arthritis, ? TIA, CHF. BSE ordered to evaluate swallow function and safety given CXR results of BLL opacities concerning for persistent mild pulmonary edema, stable per previous study. RN reports no observed difficulty swallowing, lungs clear. Type of Study: Bedside swallow evaluation Previous Swallow Assessment: none Diet Prior to this Study: Regular;Thin liquids Temperature Spikes Noted: No Respiratory Status: Room air History of Recent Intubation: No Behavior/Cognition: Alert;Cooperative Oral Cavity - Dentition: Adequate natural dentition Self-Feeding Abilities: Able to feed self Patient Positioning: Upright in bed Baseline Vocal Quality: Clear Volitional Cough: Strong Volitional Swallow: Able to elicit    Oral/Motor/Sensory Function Overall Oral Motor/Sensory Function: Appears within functional limits for tasks assessed   Ice Chips Ice chips: Within functional limits Presentation: Spoon   Thin Liquid Thin Liquid: Within functional limits Presentation: Straw;Cup    Nectar Thick Nectar Thick Liquid: Not tested   Honey Thick Honey Thick Liquid: Not tested   Puree Puree: Within functional limits Presentation: Spoon   Solid   GO    Solid: Within functional limits Presentation: [name] Ore [facility], CCC-SLP [phone]  Shonna Chock 07/24/2014,12:04 PM

## 2014-07-25 LAB — GLUCOSE, CAPILLARY
GLUCOSE-CAPILLARY: 161 mg/dL — AB (ref 70–99)
Glucose-Capillary: 140 mg/dL — ABNORMAL HIGH (ref 70–99)

## 2014-07-25 MED ORDER — DOXYCYCLINE HYCLATE 100 MG PO TABS: 100.0000 mg | ORAL_TABLET | Freq: Two times a day (BID) | ORAL | Status: DC

## 2014-07-25 MED ORDER — HYDROCOD POLST-CHLORPHEN POLST 10-8 MG/5ML PO LQCR: 5.0000 mL | Freq: Every evening | ORAL | Status: DC | PRN

## 2014-07-25 MED ORDER — DEXTROMETHORPHAN HBR 15 MG/5ML PO SYRP: 10.0000 mL | ORAL_SOLUTION | Freq: Four times a day (QID) | ORAL | Status: DC | PRN

## 2014-07-25 MED ORDER — SACCHAROMYCES BOULARDII 250 MG PO CAPS: 250.0000 mg | ORAL_CAPSULE | Freq: Two times a day (BID) | ORAL | Status: DC

## 2014-07-25 NOTE — Discharge Instructions (Signed)

## 2014-07-25 NOTE — Discharge Summary (Signed)
Physician Discharge Summary  [name] XLK:[id number] DOB: [birth year] DOA: 07/23/2014  PCP: [name], MD  Admit date: 07/23/2014 Discharge date: 07/25/2014  Recommendations for Outpatient Follow-up:  1. Pt will need to follow up with PCP in 2-3 weeks post discharge 2. Please obtain BMP to evaluate electrolytes and kidney function 3. Please also check CBC to evaluate Hg and Hct levels 4. 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 In the ER patient was found to have a repetitive cough things spells. Chest x-ray was showing nothing acute with chronic changes. Patient was given 1 dose of Lasix and admitted for further management. Patient stated that he also has been having some occasional blood-tinged sputum noticed today. Denies any fever or chills. Denies any chest pain. Patient has not gained any weight from his baseline. Patient states that he has sore throat like feeling. On exam of his throat that is no obvious congestion.  Assessment/Plan:    Principal Problem:  Cough / acute respiratory failure with hypoxia / possible pneumonia - Oxygen saturation 93% on [employer] oxygen support - Pt still with cough and noted shortly after eating - SLP done, advanced diet and pt tolerating well  - He is on doxycycline for possible pneumonia and will continue for 5 more days   Active Problems:  Paroxysmal atrial  fibrillation, DCCV 06/16/13 - CHADS2-vasc score 5 - On anticoagulation with Apixaban - Rate controlled with amiodarone and metoprolol 25 mg daily    Chronic diastolic and systolic CHF (congestive heart failure) / sick sinus syndrome  - Last 2 D ECHO 12/2013 with EF 45% - has St.Jude pacemaker for sick sinus syndrome  - Continue torsemide 80 mg daily    Renal failure (ARF), acute on chronic / CKD stage 4 - Creatinine 3.0 on 07/23/2014 and seems to be trending down   Hyperkalemia - seen on 3/11 but subsequently improved with torsemide    Diabetes mellitus type 2, controlled, with renal manifestations  - Continue Lantus 9 units at bedtime and SSI - CBG in past 24 hours: 128, 241, 264    Troponin elevation - Also seen on recent admission up to 0.2 and on this admission 0.16 - Likely demand ischemia from CKD - Last admission seen by cardio and no further ischemic work up indicated - No complaints of chest pain    Anemia of chronic disease / iron deficiency anemia  - Secondary to CKD - Hemoglobin stable  - Continue ferrous sulfate supplementation    BPH - Continue Avodart and Flomax    Code Status: Full.  Family Communication: plan of care discussed with the patient and his family at the bedside  Disposition Plan: Home   IV access:  Peripheral IV  Procedures and diagnostic studies:   Dg Chest Port 1 View 07/23/2014 1. Bibasilar airspace opacities are similar in appearance to the prior study, concerning for mild persistent pulmonary edema. Previously noted pleural effusions  are improved from the prior study. 2. Mild cardiomegaly.   Medical Consultants:  None   Other Consultants:  Physical therapy  IAnti-Infectives:   Doxycycline 07/23/2014 --> 5 more days post discharge        Discharge Exam: Filed Vitals:   07/25/14 0607  BP: 114/59  Pulse: 88  Temp: 97 F (36.1 C)  Resp: 18   Filed Vitals:   07/24/14 0522 07/24/14 2027  07/24/14 2338 07/25/14 0607  BP: 121/66 138/63 128/60 114/59  Pulse: 66 60 60 88  Temp: 98 F (36.7 C) 98 F (36.7 C) 98 F (36.7 C) 97 F (36.1 C)  TempSrc: Axillary Oral Axillary Oral  Resp: 18 18 18 18   Height:      Weight: 84.6 kg (186 lb 8.2 oz)   84.2 kg (185 lb 10 oz)  SpO2: 98% 96% 96% 98%    General: Pt is alert, follows commands appropriately, not in acute distress Cardiovascular: Regular rate and rhythm, no rubs, no gallops Respiratory: Clear to auscultation bilaterally, no wheezing, no crackles, no rhonchi Abdominal: Soft, non tender, non distended, bowel sounds +, no guarding Extremities: no cyanosis, pulses palpable bilaterally DP and PT  Discharge Instructions  Discharge Instructions    Diet - low sodium heart healthy    Complete by:  As directed      Increase activity slowly    Complete by:  As directed             Medication List    TAKE these medications        allopurinol 100 MG tablet  Commonly known as:  ZYLOPRIM  Take 100 mg by mouth 2 (two) times daily.     amiodarone 200 MG tablet  Commonly known as:  PACERONE  Take 1 tablet (200 mg total) by mouth at bedtime.     apixaban 2.5 MG Tabs tablet  Commonly known as:  ELIQUIS  Take 1 tablet (2.5 mg total) by mouth 2 (two) times daily.     chlorpheniramine-HYDROcodone 10-8 MG/5ML Lqcr  Commonly known as:  TUSSIONEX PENNKINETIC ER  Take 5 mLs by mouth at bedtime as needed for cough.     dextromethorphan 15 MG/5ML syrup  Take 10 mLs (30 mg total) by mouth 4 (four) times daily as needed for cough.     doxycycline 100 MG tablet  Commonly known as:  VIBRA-TABS  Take 1 tablet (100 mg total) by mouth every 12 (twelve) hours.     dutasteride 0.5 MG capsule  Commonly known as:  AVODART  Take 0.5 mg by mouth daily.     ferrous sulfate 325 (65 FE) MG tablet  Take 325 mg by mouth every other day.     insulin glargine 100 UNIT/ML injection  Commonly known as:  LANTUS  Inject 9-10 Units into the  skin at bedtime.     isosorbide mononitrate 60 MG 24 hr tablet  Commonly known as:  IMDUR  Take 1 tablet (60 mg total) by mouth daily.     metoprolol tartrate 25 MG tablet  Commonly known as:  LOPRESSOR  Take 1 tablet (25 mg total) by mouth daily.     multivitamin with minerals Tabs tablet  Take 1 tablet by mouth daily. Diabetic pak by Petra Kuba from [employer]     nitroGLYCERIN 0.4 MG SL tablet  Commonly known as:  NITROSTAT  Place 1 tablet (0.4 mg total) under the tongue every 5 (five) minutes as needed for chest pain.  NOVOLOG FLEXPEN 100 UNIT/ML FlexPen  Generic drug:  insulin aspart  - Inject 2-9 Units into the skin 3 (three) times daily with meals. Sliding scale  - If at lunch gbc is between 159 do not give any. If 299 or above give 3 units. At bedtime it is always 9 units     polyethylene glycol packet  Commonly known as:  MIRALAX / GLYCOLAX  Take 17 g by mouth 2 (two) times daily as needed for mild constipation. For constipation     potassium chloride SA 20 MEQ tablet  Commonly known as:  K-DUR,KLOR-CON  Take 2 tablets (40 mEq total) by mouth daily.     REFRESH OP  Place 1 drop into both eyes 2 (two) times daily. Refresh Gel Opth     saccharomyces boulardii 250 MG capsule  Commonly known as:  FLORASTOR  Take 1 capsule (250 mg total) by mouth 2 (two) times daily.     tamsulosin 0.4 MG Caps capsule  Commonly known as:  FLOMAX  Take 0.8 mg by mouth at bedtime.     torsemide 20 MG tablet  Commonly known as:  DEMADEX  Take 4 tablets (80 mg total) by mouth daily.     ZIOPTAN 0.0015 % Soln  Generic drug:  Tafluprost  Place 1 drop into both eyes at bedtime. Use at night           Follow-up Information    Follow up with [name], MD.   Specialty:  Internal Medicine   Contact information:   [address] [place] [address] [phone]        The results of significant diagnostics from this hospitalization (including imaging,  microbiology, ancillary and laboratory) are listed below for reference.     Microbiology: Recent Results (from the past 240 hour(s))  MRSA PCR Screening     Status: None   Collection Time: 07/16/14  9:12 PM  Result Value Ref Range Status   MRSA by PCR NEGATIVE NEGATIVE Final    Comment:        The GeneXpert MRSA Assay (FDA approved for NASAL specimens only), is one component of a comprehensive MRSA colonization surveillance program. It is not intended to diagnose MRSA infection nor to guide or monitor treatment for MRSA infections.   Rapid strep screen     Status: None   Collection Time: 07/23/14 10:39 PM  Result Value Ref Range Status   Streptococcus, Group A Screen (Direct) NEGATIVE NEGATIVE Final    Comment: (NOTE) A Rapid Antigen test may result negative if the antigen level in the sample is below the detection level of this test. The FDA has not cleared this test as a stand-alone test therefore the rapid antigen negative result has reflexed to a Group A Strep culture.      Labs: Basic Metabolic Panel:  Recent Labs Lab 07/19/14 0335 07/23/14 1827 07/24/14 0502  NA 136 137 138  K 3.1* 5.2* 4.2  CL 92* 96 97  CO2 36* 29 30  GLUCOSE 144* 184* 136*  BUN 46* 45* 45*  CREATININE 2.38* 3.00* 2.84*  CALCIUM 9.1 9.6 9.4   Liver Function Tests:  Recent Labs Lab 07/19/14 0335 07/24/14 0502  AST 52* 47*  ALT 42 41  ALKPHOS 52 50  BILITOT 0.7 0.7  PROT 6.0 6.3  ALBUMIN 2.8* 3.0*   No results for input(s): LIPASE, AMYLASE in the last 168 hours.  Recent Labs Lab 07/19/14 0335  AMMONIA 30   CBC:  Recent Labs Lab 07/19/14 0335 07/23/14 1827 07/24/14 0502  WBC 6.1 7.6 6.3  NEUTROABS  --   --  4.2  HGB 10.8* 12.2* 10.7*  HCT 33.0* 37.4* 33.4*  MCV 99.4 102.5* 99.4  PLT 191 194 170   Cardiac Enzymes:  Recent Labs Lab 07/23/14 2358 07/24/14 0502 07/24/14 1120  TROPONINI 0.15* 0.16* 0.16*   BNP: BNP (last 3 results)  Recent Labs   06/22/14 1140 07/16/14 1431 07/23/14 1827  BNP 447.7* 506.6* 359.1*    ProBNP (last 3 results)  Recent Labs  12/23/13 0535 04/04/14 0742 07/16/14 1213  PROBNP 4564.0* 4874.0* 460.0*    CBG:  Recent Labs Lab 07/24/14 0610 07/24/14 1117 07/24/14 1654 07/24/14 2107 07/25/14 0625  GLUCAP 128* 241* 264* 265* 140*     SIGNED: Time coordinating discharge: Over 30 minutes  Faye Ramsay, MD  [facility] 07/25/2014, 9:57 AM Pager [phone]  If 7PM-7AM, please contact night-coverage [email or web address] Password [email or web address]

## 2014-07-26 ENCOUNTER — Ambulatory Visit (INDEPENDENT_AMBULATORY_CARE_PROVIDER_SITE_OTHER): Payer: Medicare Other | Admitting: Interventional Cardiology

## 2014-07-26 ENCOUNTER — Encounter: Payer: Self-pay | Admitting: Interventional Cardiology

## 2014-07-26 VITALS — BP 110/60 | HR 67 | Ht 69.0 in | Wt 176.3 lb

## 2014-07-26 DIAGNOSIS — I5032 Chronic diastolic (congestive) heart failure: Secondary | ICD-10-CM

## 2014-07-26 DIAGNOSIS — I208 Other forms of angina pectoris: Secondary | ICD-10-CM

## 2014-07-26 LAB — BASIC METABOLIC PANEL
BUN: 47 mg/dL — AB (ref 6–23)
CO2: 39 mEq/L — ABNORMAL HIGH (ref 19–32)
Calcium: 9.8 mg/dL (ref 8.4–10.5)
Chloride: 91 mEq/L — ABNORMAL LOW (ref 96–112)
Creatinine, Ser: 2.67 mg/dL — ABNORMAL HIGH (ref 0.40–1.50)
GFR: 28.81 mL/min — AB (ref >60.00)
Glucose, Bld: 167 mg/dL — ABNORMAL HIGH (ref 70–99)
POTASSIUM: 3.5 meq/L (ref 3.5–5.1)
Sodium: 133 mEq/L — ABNORMAL LOW (ref 135–145)

## 2014-07-26 LAB — CULTURE, GROUP A STREP: STREP A CULTURE: NEGATIVE

## 2014-07-26 NOTE — Progress Notes (Signed)
Cardiology Office Note   Date:  07/26/2014   ID:  [name], DOB Sep 14, 1918, MRN [id number]  PCP:  [name], MD  Cardiologist:   [name], MD   No chief complaint on file.     History of Present Illness: [name] is a 81 y.o. male who presents for follow-up after recent hospitalization for refractory cough and diastolic heart failure. He has a history of paroxysmal atrial fibrillation and AV node disease with permanent pacemaker. He is on amiodarone therapy for management of atrial fibrillation. He has stage IV chronic kidney disease. Recent hospitalization was for proximally 36 hours. Cough resolved after morphine and a single dose of IV Lasix. Since discharge home he has been relatively stable. Cough has not recurred. He denies chest pain. Appetite is been good. Weight at home this morning was 176 pounds. Iron ID her weight is less than 183 pounds on the home scales.    Past Medical History  Diagnosis Date  . Hyperlipidemia   . Spinal stenosis   . Anemia   . Carotid artery disease     a. s/p prior carotid endarterectomy.  [name] PVD (peripheral vascular disease)   . Hyponatremia     Chronic  . CVA (cerebral infarction)     a. When asked to clarify patient says he was told thumb numbness may be TIA. He does not recall formal stroke. CT head results are only available through [facility], not in [facility].  . CKD (chronic kidney disease) stage 4, GFR 15-29 ml/min   . Long term (current) use of anticoagulants   . PAF (paroxysmal atrial fibrillation)     a. Asymptomatic. On Eliquis. CHADSVASC 7.  . Sick sinus syndrome     With DDD St. Jude PM, initially placed in 1993 by Dr. Nils Pyle. Device upgrade 10/2002 to DDD, by Dr. Rollene Fare complicated by bleeding. Subsequent gen change 2011.  [name] Hypertensive cardiovascular disease   . Chronic diastolic congestive heart failure     ECHO 05/20/12 LVEF estimated by 2D at 55-60%  . Arthritis   . Gait disorder   . Diabetic  peripheral neuropathy associated with type 2 diabetes mellitus   . Diabetes mellitus     insulin dependent  . Elevated troponin I level 06/22/2014    a. 06/2014 felt due to demand ischemia.  . Chronic anticoagulation     For PAF, on Eliquis   . Urinary retention   . Pulmonary hypertension   . Foot drop, left 07/21/2014  . Asterixis 07/21/2014    Past Surgical History  Procedure Laterality Date  . Lumbar laminectomy  1995  . Back surgery    . Total knee arthroplasty Left   . Quadriceps tendon repair    . Cataract extraction    . Carotid endarterectomy    . Yag laser application Right 08/20/8117    Procedure: YAG LASER CAPSULOTOMY OF RIGHT EYE;  Surgeon: [name], MD;  Location: [facility];  Service: Ophthalmology;  Laterality: Right;  . Tonsillectomy    . Pacemaker insertion      DDD St. Jude PM, initially placed in 1993 by Dr. Nils Pyle. Device upgrade 10/2002 to DDD, by Dr. Rollene Fare complicated by bleeding.  . Carotid endarterectomy Right 2006  . Transurethral resection of prostate    . Cardioversion N/A 06/16/2013    Procedure: CARDIOVERSION BEDSIDE;  Surgeon: [name], MD;  Location: [facility];  Service: Cardiovascular;  Laterality: N/A;  . Pacemaker generator change  12/09/2009    SJM Accent DR RF gen change by Dr [name]     Current Outpatient Prescriptions  Medication Sig Dispense Refill  . allopurinol (ZYLOPRIM) 100 MG tablet Take 100 mg by mouth 2 (two) times daily.     [name] amiodarone (PACERONE) 200 MG tablet Take 1 tablet (200 mg total) by mouth at bedtime.    [name] apixaban (ELIQUIS) 2.5 MG TABS tablet Take 1 tablet (2.5 mg total) by mouth 2 (two) times daily. 60 tablet 5  . chlorpheniramine-HYDROcodone (TUSSIONEX PENNKINETIC ER) 10-8 MG/5ML LQCR Take 5 mLs by mouth at bedtime as needed for cough. 120 mL 0  . dextromethorphan 15 MG/5ML syrup Take 10 mLs (30 mg total) by mouth 4 (four) times daily as needed for cough. 120 mL 0  . doxycycline (VIBRA-TABS) 100 MG tablet  Take 1 tablet (100 mg total) by mouth every 12 (twelve) hours. 10 tablet 0  . dutasteride (AVODART) 0.5 MG capsule Take 0.5 mg by mouth daily.     . ferrous sulfate 325 (65 FE) MG tablet Take 325 mg by mouth every other day.    . insulin aspart (NOVOLOG FLEXPEN) 100 UNIT/ML FlexPen Inject 2-9 Units into the skin 3 (three) times daily with meals. Sliding scale If at lunch gbc is between 159 do not give any. If 299 or above give 3 units. At bedtime it is always 9 units    . insulin glargine (LANTUS) 100 UNIT/ML injection Inject 9-10 Units into the skin at bedtime.     . isosorbide mononitrate (IMDUR) 60 MG 24 hr tablet Take 1 tablet (60 mg total) by mouth daily. 30 tablet 5  . metoprolol tartrate (LOPRESSOR) 25 MG tablet Take 1 tablet (25 mg total) by mouth daily. 30 tablet 6  . Multiple Vitamin (MULTIVITAMIN WITH MINERALS) TABS Take 1 tablet by mouth daily. Diabetic pak by Petra Kuba from [employer]    . nitroGLYCERIN (NITROSTAT) 0.4 MG SL tablet Place 1 tablet (0.4 mg total) under the tongue every 5 (five) minutes as needed for chest pain. 25 tablet 3  . polyethylene glycol (MIRALAX / GLYCOLAX) packet Take 17 g by mouth 2 (two) times daily as needed for mild constipation. For constipation    . Polyvinyl Alcohol-Povidone (REFRESH OP) Place 1 drop into both eyes 2 (two) times daily. Refresh Gel Opth    . potassium chloride SA (K-DUR,KLOR-CON) 20 MEQ tablet Take 2 tablets (40 mEq total) by mouth daily. 30 tablet 6  . saccharomyces boulardii (FLORASTOR) 250 MG capsule Take 1 capsule (250 mg total) by mouth 2 (two) times daily. 20 capsule 0  . Tafluprost (ZIOPTAN) 0.0015 % SOLN Place 1 drop into both eyes at bedtime. Use at night    . tamsulosin (FLOMAX) 0.4 MG CAPS capsule Take 0.8 mg by mouth at bedtime.    . torsemide (DEMADEX) 20 MG tablet Take 4 tablets (80 mg total) by mouth daily. 120 tablet 3   No current facility-administered medications for this visit.    Allergies:   Statins; Aggrenox; Carvedilol;  Claritin; Cymbalta; Mucinex; Plavix; Rapaflo; and Travatan z    Social History:  The patient  reports that he quit smoking about 56 years ago. His smoking use included Pipe. He has never used smokeless tobacco. He reports that he does not drink alcohol or use illicit drugs.   Family History:  The patient's family history includes Hypertension in his mother; Multiple myeloma in his father.    ROS:  Please see the history of present illness.  Otherwise, review of systems are positive for asterixis/muscle twitching..   All other systems are reviewed and negative.    PHYSICAL EXAM: VS:  BP 110/60 mmHg  Pulse 67  Ht _0  (1.753 m)  Wt 176 lb 4.8 oz (79.969 kg)  BMI 26.02 kg/m2  SpO2 93% , BMI Body mass index is 26.02 kg/(m^2). GEN: Well nourished, well developed, in no acute distress HEENT: normal Neck: no JVD, carotid bruits, or masses Cardiac: RRR; no murmurs, rubs, or gallops,no edema  Respiratory:  clear to auscultation bilaterally, normal work of breathing GI: soft, nontender, nondistended, + BS MS: no deformity or atrophy Skin: warm and dry, no rash Neuro:  Asterixis Psych: euthymic mood, full affect   EKG:  EKG is not ordered today.    Recent Labs: 06/22/2014: Magnesium 2.4 07/16/2014: Pro B Natriuretic peptide (BNP) 460.0*; TSH 1.050 07/23/2014: B Natriuretic Peptide 359.1* 07/24/2014: ALT 41; Hemoglobin 10.7*; Platelets 170 07/26/2014: BUN 47*; Creatinine 2.67*; Potassium 3.5; Sodium 133*    Lipid Panel No results found for: CHOL, TRIG, HDL, CHOLHDL, VLDL, LDLCALC, LDLDIRECT    Wt Readings from Last 3 Encounters:  07/26/14 176 lb 4.8 oz (79.969 kg)  07/25/14 185 lb 10 oz (84.2 kg)  07/21/14 180 lb 6.4 oz (81.829 kg)      Other studies Reviewed: Additional studies/ records that were reviewed today include: Reviewed the recent hospital stay.. Review of the above records demonstrates: Persistently abnormal renal function.   ASSESSMENT AND PLAN:  1.  Chronic  diastolic heart failure, without evidence of volume overload currently. Our new dry weight is 183 pounds on the home scales. Weights above 183 pounds will require a dose of metolazone. 2. Stage IV chronic kidney disease, need to recheck renal function today. 3. Hypertension, under good control 4. Chronic anticoagulation therapy without recurrence of hemoptysis    Current medicines are reviewed at length with the patient today.  The patient has concerns regarding medicines.  The following changes have been made:  We will continue Demadex 80 mg daily. We will use metolazone once per week on Fridays. Metolazone may also be required if the weight is 183 pounds or greater.  Labs/ tests ordered today include:   Orders Placed This Encounter  Procedures  . Basic metabolic panel     Disposition:   FU with H.Smith in 1 week   Signed, [name], MD  07/26/2014 7:30 PM    [facility] [address] [place], [place], [place]  [address] Phone: [phone]; Fax: [phone]

## 2014-07-26 NOTE — Patient Instructions (Addendum)
Your physician recommends that you continue on your current medications as directed. Please refer to the Current Medication list given to you today.  Lab Today: Bmet  You have a follow up appointment scheduled on 08/06/14 @ 8am

## 2014-07-26 NOTE — Progress Notes (Signed)
UR Completed.  [phone]  

## 2014-07-28 ENCOUNTER — Telehealth: Payer: Self-pay

## 2014-07-28 NOTE — Telephone Encounter (Signed)
pt aware of lab results.Labs are stable. Kidney function better.pt verbalized understanding.

## 2014-07-28 NOTE — Telephone Encounter (Signed)
-----   Message from Belva Crome, MD sent at 07/27/2014  7:28 PM EDT ----- Labs are stable. Kidney function better

## 2014-08-02 ENCOUNTER — Ambulatory Visit: Payer: Medicare Other | Admitting: Interventional Cardiology

## 2014-08-02 ENCOUNTER — Encounter: Payer: Self-pay | Admitting: Interventional Cardiology

## 2014-08-02 ENCOUNTER — Ambulatory Visit (INDEPENDENT_AMBULATORY_CARE_PROVIDER_SITE_OTHER): Payer: Medicare Other | Admitting: Interventional Cardiology

## 2014-08-02 VITALS — BP 128/62 | HR 64 | Ht 69.0 in | Wt 177.1 lb

## 2014-08-02 DIAGNOSIS — I480 Paroxysmal atrial fibrillation: Secondary | ICD-10-CM | POA: Diagnosis not present

## 2014-08-02 DIAGNOSIS — I208 Other forms of angina pectoris: Secondary | ICD-10-CM

## 2014-08-02 DIAGNOSIS — N184 Chronic kidney disease, stage 4 (severe): Secondary | ICD-10-CM | POA: Diagnosis not present

## 2014-08-02 DIAGNOSIS — G629 Polyneuropathy, unspecified: Secondary | ICD-10-CM

## 2014-08-02 DIAGNOSIS — I5032 Chronic diastolic (congestive) heart failure: Secondary | ICD-10-CM | POA: Diagnosis not present

## 2014-08-02 DIAGNOSIS — E1142 Type 2 diabetes mellitus with diabetic polyneuropathy: Secondary | ICD-10-CM | POA: Diagnosis not present

## 2014-08-02 DIAGNOSIS — Z7901 Long term (current) use of anticoagulants: Secondary | ICD-10-CM

## 2014-08-02 DIAGNOSIS — Z79899 Other long term (current) drug therapy: Secondary | ICD-10-CM

## 2014-08-02 NOTE — Patient Instructions (Addendum)
Your physician recommends that you continue on your current medications as directed. Please refer to the Current Medication list given to you today.  Your physician recommends that you return for lab work (Bmet) on 08/09/14 between 7:30am-5:15pm  You have a follow up appointment on 08/18/14 @ 9am

## 2014-08-02 NOTE — Progress Notes (Signed)
Cardiology Office Note   Date:  08/02/2014   ID:  [name], DOB 1919-02-23, MRN [id number]  PCP:  [name], MD  Cardiologist:   [name], MD   No chief complaint on file.     History of Present Illness: [name] is a 81 y.o. male who presents for diastolic heart failure and chronic kidney disease. He is on apixaban for paroxysmal atrial fibrillation. No bleeding. Appetite is stable.    Past Medical History  Diagnosis Date  . Hyperlipidemia   . Spinal stenosis   . Anemia   . Carotid artery disease     a. s/p prior carotid endarterectomy.  [name] PVD (peripheral vascular disease)   . Hyponatremia     Chronic  . CVA (cerebral infarction)     a. When asked to clarify patient says he was told thumb numbness may be TIA. He does not recall formal stroke. CT head results are only available through [facility], not in [facility].  . CKD (chronic kidney disease) stage 4, GFR 15-29 ml/min   . Long term (current) use of anticoagulants   . PAF (paroxysmal atrial fibrillation)     a. Asymptomatic. On Eliquis. CHADSVASC 7.  . Sick sinus syndrome     With DDD St. Jude PM, initially placed in 1993 by Dr. Nils Pyle. Device upgrade 10/2002 to DDD, by Dr. Rollene Fare complicated by bleeding. Subsequent gen change 2011.  [name] Hypertensive cardiovascular disease   . Chronic diastolic congestive heart failure     ECHO 05/20/12 LVEF estimated by 2D at 55-60%  . Arthritis   . Gait disorder   . Diabetic peripheral neuropathy associated with type 2 diabetes mellitus   . Diabetes mellitus     insulin dependent  . Elevated troponin I level 06/22/2014    a. 06/2014 felt due to demand ischemia.  . Chronic anticoagulation     For PAF, on Eliquis   . Urinary retention   . Pulmonary hypertension   . Foot drop, left 07/21/2014  . Asterixis 07/21/2014    Past Surgical History  Procedure Laterality Date  . Lumbar laminectomy  1995  . Back surgery    . Total knee arthroplasty Left   . Quadriceps  tendon repair    . Cataract extraction    . Carotid endarterectomy    . Yag laser application Right 6/96/2952    Procedure: YAG LASER CAPSULOTOMY OF RIGHT EYE;  Surgeon: [name], MD;  Location: [place];  Service: Ophthalmology;  Laterality: Right;  . Tonsillectomy    . Pacemaker insertion      DDD St. Jude PM, initially placed in 1993 by Dr. Nils Pyle. Device upgrade 10/2002 to DDD, by Dr. Rollene Fare complicated by bleeding.  . Carotid endarterectomy Right 2006  . Transurethral resection of prostate    . Cardioversion N/A 06/16/2013    Procedure: CARDIOVERSION BEDSIDE;  Surgeon: [name], MD;  Location: [facility];  Service: Cardiovascular;  Laterality: N/A;  . Pacemaker generator change  12/09/2009    SJM Accent DR RF gen change by Dr [name]     Current Outpatient Prescriptions  Medication Sig Dispense Refill  . allopurinol (ZYLOPRIM) 100 MG tablet Take 100 mg by mouth 2 (two) times daily.     [name] amiodarone (PACERONE) 200 MG tablet Take 1 tablet (200 mg total) by mouth at bedtime.    [name] apixaban (ELIQUIS) 2.5 MG TABS tablet Take 1 tablet (2.5 mg total) by mouth 2 (two)  times daily. 60 tablet 5  . chlorpheniramine-HYDROcodone (TUSSIONEX PENNKINETIC ER) 10-8 MG/5ML LQCR Take 5 mLs by mouth at bedtime as needed for cough. 120 mL 0  . dextromethorphan 15 MG/5ML syrup Take 10 mLs (30 mg total) by mouth 4 (four) times daily as needed for cough. 120 mL 0  . dutasteride (AVODART) 0.5 MG capsule Take 0.5 mg by mouth daily.     . ferrous sulfate 325 (65 FE) MG tablet Take 325 mg by mouth every other day.    . insulin aspart (NOVOLOG FLEXPEN) 100 UNIT/ML FlexPen Inject 2-9 Units into the skin 3 (three) times daily with meals. Sliding scale If at lunch gbc is between 159 do not give any. If 299 or above give 3 units. At bedtime it is always 9 units    . insulin glargine (LANTUS) 100 UNIT/ML injection Inject 9-10 Units into the skin at bedtime.     . isosorbide mononitrate (IMDUR) 60 MG 24  hr tablet Take 1 tablet (60 mg total) by mouth daily. 30 tablet 5  . metoprolol tartrate (LOPRESSOR) 25 MG tablet Take 1 tablet (25 mg total) by mouth daily. 30 tablet 6  . Multiple Vitamin (MULTIVITAMIN WITH MINERALS) TABS Take 1 tablet by mouth daily. Diabetic pak by Petra Kuba from [employer]    . nitroGLYCERIN (NITROSTAT) 0.4 MG SL tablet Place 1 tablet (0.4 mg total) under the tongue every 5 (five) minutes as needed for chest pain. 25 tablet 3  . polyethylene glycol (MIRALAX / GLYCOLAX) packet Take 17 g by mouth 2 (two) times daily as needed for mild constipation. For constipation    . Polyvinyl Alcohol-Povidone (REFRESH OP) Place 1 drop into both eyes 2 (two) times daily. Refresh Gel Opth    . potassium chloride SA (K-DUR,KLOR-CON) 20 MEQ tablet Take 2 tablets (40 mEq total) by mouth daily. 30 tablet 6  . saccharomyces boulardii (FLORASTOR) 250 MG capsule Take 1 capsule (250 mg total) by mouth 2 (two) times daily. 20 capsule 0  . Tafluprost (ZIOPTAN) 0.0015 % SOLN Place 1 drop into both eyes at bedtime. Use at night    . tamsulosin (FLOMAX) 0.4 MG CAPS capsule Take 0.8 mg by mouth at bedtime.    . torsemide (DEMADEX) 20 MG tablet Take 4 tablets (80 mg total) by mouth daily. 120 tablet 3   No current facility-administered medications for this visit.    Allergies:   Statins; Aggrenox; Carvedilol; Claritin; Cymbalta; Mucinex; Plavix; Rapaflo; and Travatan z    Social History:  The patient  reports that he quit smoking about 56 years ago. His smoking use included Pipe. He has never used smokeless tobacco. He reports that he does not drink alcohol or use illicit drugs.   Family History:  The patient's family history includes Hypertension in his mother; Multiple myeloma in his father.    ROS:  Please see the history of present illness.   Otherwise, review of systems are positive for asterixis and weakness..   All other systems are reviewed and negative.    PHYSICAL EXAM: VS:  BP 128/62 mmHg   Pulse 64  Ht $R'5\' 9"'dR$  (1.753 m)  Wt 177 lb 1.6 oz (80.332 kg)  BMI 26.14 kg/m2 , BMI Body mass index is 26.14 kg/(m^2). GEN: Well nourished, well developed, in no acute distress HEENT: normal Neck: no JVD, carotid bruits, or masses Cardiac: RRR; no murmurs, rubs, or gallops,no edema  Respiratory:  clear to auscultation bilaterally, normal work of breathing GI: soft, nontender, nondistended, + BS  MS: no deformity or atrophy Skin: warm and dry, no rash Neuro:  Strength and sensation are intact Psych: euthymic mood, full affect   EKG:  EKG is not ordered today. The ekg ordered today demonstrates    Recent Labs: 06/22/2014: Magnesium 2.4 07/16/2014: Pro B Natriuretic peptide (BNP) 460.0*; TSH 1.050 07/23/2014: B Natriuretic Peptide 359.1* 07/24/2014: ALT 41; Hemoglobin 10.7*; Platelets 170 07/26/2014: BUN 47*; Creatinine 2.67*; Potassium 3.5; Sodium 133*    Lipid Panel No results found for: CHOL, TRIG, HDL, CHOLHDL, VLDL, LDLCALC, LDLDIRECT    Wt Readings from Last 3 Encounters:  08/02/14 177 lb 1.6 oz (80.332 kg)  07/26/14 176 lb 4.8 oz (79.969 kg)  07/25/14 185 lb 10 oz (84.2 kg)      Other studies Reviewed: Additional studies/ records that were reviewed today include: None.    ASSESSMENT AND PLAN:  ,Chronic diastolic heart failure -   Paroxysmal atrial fibrillation, -   CKD (chronic kidney disease) stage 4, GFR 15-29 ml/min -  Diabetic peripheral neuropathy associated with type 2 diabetes mellitus  Long term current use of amiodarone  Chronic anticoagulation    Current medicines are reviewed at length with the patient today.  The patient does not have concerns regarding medicines.  The following changes have been made:  no change  Labs/ tests ordered today include:   Orders Placed This Encounter  Procedures  . Basic metabolic panel     Disposition:   FU with Linard Millers in 2 weeks   Signed, [name], MD  08/02/2014 8:54 AM    [facility], [place], [place]  [address] Phone: [phone]; Fax: [phone]

## 2014-08-09 ENCOUNTER — Other Ambulatory Visit: Payer: Medicare Other

## 2014-08-12 ENCOUNTER — Ambulatory Visit: Payer: Medicare Other | Admitting: Neurology

## 2014-08-16 ENCOUNTER — Other Ambulatory Visit: Payer: Self-pay

## 2014-08-18 ENCOUNTER — Encounter: Payer: Self-pay | Admitting: Interventional Cardiology

## 2014-08-18 ENCOUNTER — Telehealth: Payer: Self-pay | Admitting: *Deleted

## 2014-08-18 ENCOUNTER — Ambulatory Visit (INDEPENDENT_AMBULATORY_CARE_PROVIDER_SITE_OTHER): Payer: Medicare Other | Admitting: Interventional Cardiology

## 2014-08-18 VITALS — BP 118/78 | HR 64 | Ht 67.0 in | Wt 178.2 lb

## 2014-08-18 DIAGNOSIS — Z7901 Long term (current) use of anticoagulants: Secondary | ICD-10-CM

## 2014-08-18 DIAGNOSIS — I208 Other forms of angina pectoris: Secondary | ICD-10-CM | POA: Diagnosis not present

## 2014-08-18 DIAGNOSIS — I480 Paroxysmal atrial fibrillation: Secondary | ICD-10-CM

## 2014-08-18 DIAGNOSIS — I5032 Chronic diastolic (congestive) heart failure: Secondary | ICD-10-CM

## 2014-08-18 NOTE — Telephone Encounter (Signed)
S/w Hassan Rowan @  [phone] at [facility], [name] requested recent lab work results.  Will fax over this am.

## 2014-08-18 NOTE — Progress Notes (Signed)
.     Cardiology Office Note   Date:  08/18/2014   ID:  Ollen Gross [name], DOB 02-07-1919, MRN [id number]  PCP:  [name], MD  Cardiologist:   [name], MD   No chief complaint on file.     History of Present Illness: [name] is a 81 y.o. male who presents for follow-up of chronic diastolic heart failure. He denies dyspnea. He has had difficulty with recurring urinary outflow obstruction requiring repeated urinary catheterization. He now has no indwelling Foley. He denies angina. No blood in the urine or stool.    Past Medical History  Diagnosis Date  . Hyperlipidemia   . Spinal stenosis   . Anemia   . Carotid artery disease     a. s/p prior carotid endarterectomy.  [name] PVD (peripheral vascular disease)   . Hyponatremia     Chronic  . CVA (cerebral infarction)     a. When asked to clarify patient says he was told thumb numbness may be TIA. He does not recall formal stroke. CT head results are only available through [facility], not in [facility].  . CKD (chronic kidney disease) stage 4, GFR 15-29 ml/min   . Long term (current) use of anticoagulants   . PAF (paroxysmal atrial fibrillation)     a. Asymptomatic. On Eliquis. CHADSVASC 7.  . Sick sinus syndrome     With DDD St. Jude PM, initially placed in 1993 by Dr. Nils Pyle. Device upgrade 10/2002 to DDD, by Dr. Rollene Fare complicated by bleeding. Subsequent gen change 2011.  [name] Hypertensive cardiovascular disease   . Chronic diastolic congestive heart failure     ECHO 05/20/12 LVEF estimated by 2D at 55-60%  . Arthritis   . Gait disorder   . Diabetic peripheral neuropathy associated with type 2 diabetes mellitus   . Diabetes mellitus     insulin dependent  . Elevated troponin I level 06/22/2014    a. 06/2014 felt due to demand ischemia.  . Chronic anticoagulation     For PAF, on Eliquis   . Urinary retention   . Pulmonary hypertension   . Foot drop, left 07/21/2014  . Asterixis 07/21/2014    Past Surgical History    Procedure Laterality Date  . Lumbar laminectomy  1995  . Back surgery    . Total knee arthroplasty Left   . Quadriceps tendon repair    . Cataract extraction    . Carotid endarterectomy    . Yag laser application Right 2/42/6834    Procedure: YAG LASER CAPSULOTOMY OF RIGHT EYE;  Surgeon: [name], MD;  Location: [place];  Service: Ophthalmology;  Laterality: Right;  . Tonsillectomy    . Pacemaker insertion      DDD St. Jude PM, initially placed in 1993 by Dr. Nils Pyle. Device upgrade 10/2002 to DDD, by Dr. Rollene Fare complicated by bleeding.  . Carotid endarterectomy Right 2006  . Transurethral resection of prostate    . Cardioversion N/A 06/16/2013    Procedure: CARDIOVERSION BEDSIDE;  Surgeon: [name], MD;  Location: [place];  Service: Cardiovascular;  Laterality: N/A;  . Pacemaker generator change  12/09/2009    SJM Accent DR RF gen change by Dr [name]     Current Outpatient Prescriptions  Medication Sig Dispense Refill  . allopurinol (ZYLOPRIM) 100 MG tablet Take 100 mg by mouth 2 (two) times daily.     [name] amiodarone (PACERONE) 200 MG tablet Take 1 tablet (200 mg total)  by mouth at bedtime.    [name] apixaban (ELIQUIS) 2.5 MG TABS tablet Take 1 tablet (2.5 mg total) by mouth 2 (two) times daily. 60 tablet 5  . chlorpheniramine-HYDROcodone (TUSSIONEX PENNKINETIC ER) 10-8 MG/5ML LQCR Take 5 mLs by mouth at bedtime as needed for cough. 120 mL 0  . dutasteride (AVODART) 0.5 MG capsule Take 0.5 mg by mouth daily.     . ferrous sulfate 325 (65 FE) MG tablet Take 325 mg by mouth every other day.    . insulin aspart (NOVOLOG FLEXPEN) 100 UNIT/ML FlexPen Inject 2-9 Units into the skin 3 (three) times daily with meals. Sliding scale If at lunch gbc is between 159 do not give any. If 299 or above give 3 units. At bedtime it is always 9 units    . insulin glargine (LANTUS) 100 UNIT/ML injection Inject 9-10 Units into the skin at bedtime.     . isosorbide mononitrate (IMDUR) 60 MG  24 hr tablet Take 1 tablet (60 mg total) by mouth daily. 30 tablet 5  . metoprolol tartrate (LOPRESSOR) 25 MG tablet Take 1 tablet (25 mg total) by mouth daily. 30 tablet 6  . Multiple Vitamin (MULTIVITAMIN WITH MINERALS) TABS Take 1 tablet by mouth daily. Diabetic pak by Petra Kuba from [employer]    . nitroGLYCERIN (NITROSTAT) 0.4 MG SL tablet Place 1 tablet (0.4 mg total) under the tongue every 5 (five) minutes as needed for chest pain. 25 tablet 3  . polyethylene glycol (MIRALAX / GLYCOLAX) packet Take 17 g by mouth 2 (two) times daily as needed for mild constipation. For constipation    . Polyvinyl Alcohol-Povidone (REFRESH OP) Place 1 drop into both eyes 2 (two) times daily. Refresh Gel Opth    . potassium chloride SA (K-DUR,KLOR-CON) 20 MEQ tablet Take 2 tablets (40 mEq total) by mouth daily. 30 tablet 6  . Tafluprost (ZIOPTAN) 0.0015 % SOLN Place 1 drop into both eyes at bedtime. Use at night    . torsemide (DEMADEX) 20 MG tablet Take 4 tablets (80 mg total) by mouth daily. 120 tablet 3   No current facility-administered medications for this visit.    Allergies:   Statins; Aggrenox; Carvedilol; Claritin; Cymbalta; Mucinex; Plavix; Rapaflo; and Travatan z    Social History:  The patient  reports that he quit smoking about 56 years ago. His smoking use included Pipe. He has never used smokeless tobacco. He reports that he does not drink alcohol or use illicit drugs.   Family History:  The patient's family history includes Hypertension in his mother; Multiple myeloma in his father.    ROS:  Please see the history of present illness.   Otherwise, review of systems are positive for difficulty with urination.   All other systems are reviewed and negative.    PHYSICAL EXAM: VS:  BP 118/78 mmHg  Pulse 64  Ht '5\' 7"'  (1.702 m)  Wt 178 lb 3.2 oz (80.831 kg)  BMI 27.90 kg/m2 , BMI Body mass index is 27.9 kg/(m^2). GEN: Well nourished, well developed, in no acute distress HEENT: normal Neck: no  JVD, carotid bruits, or masses Cardiac: RRR; no murmurs, rubs, or gallops,no edema  Respiratory:  clear to auscultation bilaterally, normal work of breathing. Decreased breath sounds at the left base GI: soft, nontender, nondistended, + BS MS: no deformity or atrophy Skin: warm and dry, no rash Neuro:  Strength and sensation are intact Psych: euthymic mood, full affect   EKG:  EKG is not ordered today.  Recent Labs: 06/22/2014: Magnesium 2.4 07/16/2014: Pro B Natriuretic peptide (BNP) 460.0*; TSH 1.050 07/23/2014: B Natriuretic Peptide 359.1* 07/24/2014: ALT 41; Hemoglobin 10.7*; Platelets 170 07/26/2014: BUN 47*; Creatinine 2.67*; Potassium 3.5; Sodium 133*    Lipid Panel No results found for: CHOL, TRIG, HDL, CHOLHDL, VLDL, LDLCALC, LDLDIRECT    Wt Readings from Last 3 Encounters:  08/18/14 178 lb 3.2 oz (80.831 kg)  08/02/14 177 lb 1.6 oz (80.332 kg)  07/26/14 176 lb 4.8 oz (79.969 kg)      Other studies Reviewed:. None    ASSESSMENT AND PLAN:  Chronic diastolic heart failure - : No evidence of volume overload  Paroxysmal atrial fibrillation, DCCV 06/16/13  Chronic anticoagulation  Preoperative Cardiovascular Examination: He is currently stable. He is cleared for possible TURP. Will be at moderate to high  CV complications risk because of age and comorbidities     Current medicines are reviewed at length with the patient today.  The patient does not have concerns regarding medicines.  The following changes have been made:  no change  Labs/ tests ordered today include:   Orders Placed This Encounter  Procedures  . Basic Metabolic Panel (BMET)     Disposition:   FU with Linard Millers in 2 weeks   Signed, [name], MD  08/18/2014 9:37 AM    [facility] [place], [place],   [address] Phone: [phone]; Fax: [phone]

## 2014-08-18 NOTE — Patient Instructions (Signed)
Your physician recommends that you continue on your current medications as directed. Please refer to the Current Medication list given to you today.   Your physician recommends that you return for lab work the day of your office visit with [name]   Your physician recommends that you keep your scheduled  follow-up appointment with [name]  I request labs from Dr. Florene Glen today

## 2014-08-23 ENCOUNTER — Other Ambulatory Visit: Payer: Self-pay | Admitting: Urology

## 2014-08-25 ENCOUNTER — Encounter (HOSPITAL_COMMUNITY): Payer: Self-pay

## 2014-08-25 NOTE — Patient Instructions (Addendum)
[name] DR [name]  08/25/2014   Your procedure is scheduled on:    08/27/2014    Report to [facility] Main  Entrance and follow signs to               [place] at      [place] AM.  Call this number if you have problems the morning of surgery [phone]   Remember:  Eat a good healthy snack prior to bedtime.    Do not eat food or drink liquids :After Midnight.             Take 1/2 of evening dose of Insulin nite before surgery.      Take these medicines the morning of surgery with A SIP OF WATER:    Allopurinol, , Imdur, metoprolol, Refresh eye drops                                 You may not have any metal on your body including hair pins and              piercings  Do not wear jewelry, , lotions, powders or perfumes., deodorant.                             Men may shave face and neck.   Do not bring valuables to the hospital. [place].  Contacts, dentures or bridgework may not be worn into surgery.  Leave suitcase in the car. After surgery it may be brought to your room.         Special Instructions: coughing and deep breathing exercises, leg exercises               Please read over the following fact sheets you were given: _____________________________________________________________________             [facility] - Preparing for Surgery Before surgery, you can play an important role.  Because skin is not sterile, your skin needs to be as free of germs as possible.  You can reduce the number of germs on your skin by washing with CHG (chlorahexidine gluconate) soap before surgery.  CHG is an antiseptic cleaner which kills germs and bonds with the skin to continue killing germs even after washing. Please DO NOT use if you have an allergy to CHG or antibacterial soaps.  If your skin becomes reddened/irritated stop using the CHG and inform your nurse when you arrive at Short Stay. Do not shave  (including legs and underarms) for at least 48 hours prior to the first CHG shower.  You may shave your face/neck. Please follow these instructions carefully:  1.  Shower with CHG Soap the night before surgery and the  morning of Surgery.  2.  If you choose to wash your hair, wash your hair first as usual with your  normal  shampoo.  3.  After you shampoo, rinse your hair and body thoroughly to remove the  shampoo.                           4.  Use CHG as you would any other liquid soap.  You  can apply chg directly  to the skin and wash                       Gently with a scrungie or clean washcloth.  5.  Apply the CHG Soap to your body ONLY FROM THE NECK DOWN.   Do not use on face/ open                           Wound or open sores. Avoid contact with eyes, ears mouth and genitals (private parts).                       Wash face,  Genitals (private parts) with your normal soap.             6.  Wash thoroughly, paying special attention to the area where your surgery  will be performed.  7.  Thoroughly rinse your body with warm water from the neck down.  8.  DO NOT shower/wash with your normal soap after using and rinsing off  the CHG Soap.                9.  Pat yourself dry with a clean towel.            10.  Wear clean pajamas.            11.  Place clean sheets on your bed the night of your first shower and do not  sleep with pets. Day of Surgery : Do not apply any lotions/deodorants the morning of surgery.  Please wear clean clothes to the hospital/surgery center.  FAILURE TO FOLLOW THESE INSTRUCTIONS MAY RESULT IN THE CANCELLATION OF YOUR SURGERY PATIENT SIGNATURE_________________________________  NURSE SIGNATURE__________________________________  ________________________________________________________________________

## 2014-08-26 ENCOUNTER — Encounter (HOSPITAL_COMMUNITY): Payer: Self-pay

## 2014-08-26 ENCOUNTER — Encounter (HOSPITAL_COMMUNITY)
Admission: RE | Admit: 2014-08-26 | Discharge: 2014-08-26 | Disposition: A | Discharge status: Home / Self Care | Payer: Medicare Other | Source: Ambulatory Visit | Attending: Urology | Admitting: Urology

## 2014-08-26 ENCOUNTER — Ambulatory Visit (HOSPITAL_COMMUNITY)
Admission: RE | Admit: 2014-08-26 | Discharge: 2014-08-26 | Disposition: A | Discharge status: Home / Self Care | Payer: Medicare Other | Source: Ambulatory Visit | Attending: Urology | Admitting: Urology

## 2014-08-26 DIAGNOSIS — Z01818 Encounter for other preprocedural examination: Secondary | ICD-10-CM | POA: Diagnosis present

## 2014-08-26 HISTORY — DX: Presence of cardiac pacemaker: Z95.0

## 2014-08-26 NOTE — Progress Notes (Signed)
Had phone call in room and could not get to phone in time. Wanted to make sure not from [name] since he had stated at time of preop appointment that if he had any questions he would call me.Hulen Skains and spoke with his caregiver and she stated that she did not call me.  She stated she was going to make sure he had peanut butter crackers for snack at bedtime and she was only going to give him 1/2 of evening dose of Lantus Insulin.  Caregiver states he only takes lantus at bedtime No Novolog Insulin at bedtime.  I told her she voiced understanding of preop instructions.

## 2014-08-26 NOTE — Progress Notes (Signed)
Dr Orene Desanctis ( anesthesia) made aware of patient status on preop appointment of 08/16/14 along with CXR results from 07/23/14 and CXR done 08/26/14.  No new orders given.

## 2014-08-26 NOTE — H&P (Signed)
History of Present Illness    [name] is on Avodart and Rapaflo for LUTS. He had a TURP in September 2005. He states that he has been voiding only small amount of urine at a time. He has had an indwelling Foley catheter on and off for the past several months. He has CHF and chronic renal insufficiency. PVR is 367 ml. Cystoscopy shows regrowth of the median lobe with obstruction of the bladder neck. After cystoscopy he was catheterized for 400 ml of urine. The catheter will be left indwelling. Will discuss with Darrell Daneen Schick regarding medical clearance for TURP.   Current Meds 1. Allopurinol 100 MG Oral Tablet;  Therapy: (Recorded:11Feb2015) to Recorded 2. Amiodarone HCl - 200 MG Oral Tablet;  Therapy: (Recorded:11Feb2015) to Recorded 3. Dutasteride 0.5 MG Oral Capsule; TAKE ONE CAPSULE BY MOUTH EVERY DAY;  Therapy: [id number] to (Evaluate:02Jun2016)  Requested for: [id number]; Last  Rx:05Nov2015 Ordered 4. Eliquis 2.5 MG Oral Tablet;  Therapy: (Recorded:11Feb2015) to Recorded 5. Ferrous Sulfate 325 (65 Fe) MG Oral Tablet Delayed Release;  Therapy: (Recorded:11Feb2015) to Recorded 6. HumaLOG 100 UNIT/ML Subcutaneous Solution;  Therapy: (Recorded:11Feb2015) to Recorded 7. HydrALAZINE HCl - 25 MG Oral Tablet;  Therapy: 03Apr2015 to Recorded 8. Isosorbide Dinitrate 20 MG Oral Tablet;  Therapy: 03Apr2015 to Recorded 9. Lantus SOLN;  Therapy: (Recorded:11Feb2015) to Recorded 10. Metoprolol Tartrate 25 MG Oral Tablet;   Therapy: (Recorded:11Feb2015) to Recorded 11. Potassium Chloride Crys ER 20 MEQ Oral Tablet Extended Release;   Therapy: 03Apr2015 to Recorded 12. Rapaflo 8 MG Oral Capsule; Take 1 capsule by mouth once daily;   Therapy: 30Apr2015 to (Evaluate:28Oct2015)  Requested for: [id number]; Last   Rx:01May2015 Ordered 13. Rapaflo 8 MG Oral Capsule; TAKE 1 CAPSULE Daily;   Therapy: [id number] to (Evaluate:27Oct2015)  Requested for: 30Apr2015; Last   Rx:30Apr2015 Ordered 14. Refresh  Eye Itch Relief SOLN;   Therapy: (Recorded:28Apr2015) to Recorded 15. Systane SOLN;   Therapy: (Recorded:11Feb2015) to Recorded 16. Tamsulosin HCl - 0.4 MG Oral Capsule; TAKE 2 CAPSULE Bedtime;   Therapy: [id number] to (Last Rx:04Feb2016)  Requested for: 24MWN0272 Ordered 17. Torsemide 20 MG Oral Tablet;   Therapy: (Recorded:11Feb2015) to Recorded 18. Zioptan 0.0015 % Ophthalmic Solution;   Therapy: 16Apr2015 to Recorded  Allergies Medication  1. Tetanus Toxoids  Family History Problems  1. No pertinent family history : Mother  Social History Problems  1. Never smoker  Review of Systems Genitourinary, constitutional, skin, eye, otolaryngeal, hematologic/lymphatic, cardiovascular, pulmonary, endocrine, musculoskeletal, gastrointestinal, neurological and psychiatric system(s) were reviewed and pertinent findings if present are noted and are otherwise negative.  Genitourinary: urinary frequency and weak urinary stream.    Vitals Vital Signs [Data Includes: Last 1 Day]  Recorded: 53GUY4034 08:37AM  Blood Pressure: 111 / 67 Temperature: 97.8 F Heart Rate: 68 Respiration: 18  Physical Exam Constitutional: Well nourished and well developed . No acute distress.  ENT:1 . The ears and nose are normal in appearance1 .  Neck:1  The appearance of the neck is normal1  and no neck mass is present1 .  Pulmonary:1  No respiratory distress1  and normal respiratory rhythm and effort1 .  Cardiovascular:1  Heart rate and rhythm are normal1  . No peripheral edema.1 .  Abdomen: The abdomen is soft and nontender1  No masses are palpated1  No CVA tenderness1 . No hernias are palpable1  No hepatosplenomegaly noted1   Rectal: Rectal exam demonstrates1  normal sphincter tone1 , no tenderness1  and no masses1 . The prostate has no nodularity1  and is not tender1 . The left seminal vesicle is nonpalpable1 . The right seminal vesicle is nonpalpable1 . The perineum is normal on inspection1 .   Genitourinary: Examination of the penis demonstrates1  no discharge1 , no masses1 , no lesions1  and a normal meatus1 . The scrotum is1  without lesions1 . The right epididymis is1  palpably normal1  and non-tender1 . The left epididymis is1  palpably normal1  and non-tender1 . The right testis is1  non-tender1  and without masses1 . The left testis is1  non-tender1  and without masses1 .  Lymphatics: The  femoral1  and  inguinal1  nodes are not enlarged or tender1 .  Skin:1  Normal skin turgor1 , no visible rash1  and no visible skin lesions1 .  Neuro/Psych:1 . Mood and affect are appropriate1 .     1 Amended By: [name]; Aug 20 2014 4:11 PM EST  Procedure  Procedure: Cystoscopy   Indication: Lower Urinary Tract Symptoms.  Informed Consent: Risks, benefits, and potential adverse events were discussed and informed consent was obtained from the patient . Specific risks including, but not limited to bleeding, infection, pain, allergic reaction etc. were explained.  Prep: The patient was prepped with betadine.  Anesthesia:. Local anesthesia was administered intraurethrally with 2% lidocaine jelly.  Procedure Note:  Urethral meatus:. No abnormalities.  Anterior urethra: No abnormalities.  Prostatic urethra:. An enlarged intravesical median lobe was visualized. He had a previous TURP. There is regrowth of the median lobe.  Bladder: Visulization was clear. The ureteral orifices were in the normal anatomic position bilaterally. Examination of the bladder demonstrated moderate trabeculation. The patient tolerated the procedure well.  Complications: None.    Assessment Assessed  1. Urinary retention (R33.9) 2. Benign localized prostatic hyperplasia with lower urinary tract symptoms (LUTS) (N40.1) 3. Hesitancy (R39.11) 4. Urinary frequency (R35.0) 5. Weak urinary stream (R39.12)  Plan Leave Foley indwelling.  He needs TURP.  He is cleared for the procedure by Darrell [name].  The risks, benefits  of the procedure were discussed with him.  He understands and is agreeable.

## 2014-08-26 NOTE — Progress Notes (Signed)
[facility] LOV- 08/10/2014 on chart along with labs of CBC and CMP on chart EKG- 07/25/2014 EPIC  Last Device check- 06/02/2014 EPIC  12/23/2013 ECHo in EPIC  08/18/2014 LOV with Dr Daneen Schick in [facility]  07/21/2014- Dr Jannifer Franklin LOV in [facility]

## 2014-08-26 NOTE — Progress Notes (Signed)
Chart review done day before procedure, labs, EKG and CXR noted. Problems stable at this time

## 2014-08-26 NOTE — Progress Notes (Signed)
Labs done 08/10/2014 faxed to Dr Janice Norrie at [phone].

## 2014-08-27 ENCOUNTER — Ambulatory Visit (HOSPITAL_COMMUNITY): Payer: Medicare Other | Admitting: Anesthesiology

## 2014-08-27 ENCOUNTER — Encounter (HOSPITAL_COMMUNITY): Payer: Self-pay | Admitting: *Deleted

## 2014-08-27 ENCOUNTER — Ambulatory Visit (HOSPITAL_COMMUNITY)
Admission: RE | Admit: 2014-08-27 | Discharge: 2014-08-28 | Disposition: A | Discharge status: Home / Self Care | Payer: Medicare Other | Source: Ambulatory Visit | Attending: Urology | Admitting: Urology

## 2014-08-27 ENCOUNTER — Encounter (HOSPITAL_COMMUNITY)
Admission: RE | Disposition: A | Discharge status: Home / Self Care | Payer: Self-pay | Source: Ambulatory Visit | Attending: Urology

## 2014-08-27 DIAGNOSIS — R3912 Poor urinary stream: Secondary | ICD-10-CM | POA: Diagnosis not present

## 2014-08-27 DIAGNOSIS — Z794 Long term (current) use of insulin: Secondary | ICD-10-CM | POA: Insufficient documentation

## 2014-08-27 DIAGNOSIS — N401 Enlarged prostate with lower urinary tract symptoms: Secondary | ICD-10-CM | POA: Diagnosis present

## 2014-08-27 DIAGNOSIS — I509 Heart failure, unspecified: Secondary | ICD-10-CM | POA: Diagnosis not present

## 2014-08-27 DIAGNOSIS — Z79899 Other long term (current) drug therapy: Secondary | ICD-10-CM | POA: Insufficient documentation

## 2014-08-27 DIAGNOSIS — Z887 Allergy status to serum and vaccine status: Secondary | ICD-10-CM | POA: Diagnosis not present

## 2014-08-27 DIAGNOSIS — R3911 Hesitancy of micturition: Secondary | ICD-10-CM | POA: Insufficient documentation

## 2014-08-27 DIAGNOSIS — E119 Type 2 diabetes mellitus without complications: Secondary | ICD-10-CM | POA: Diagnosis not present

## 2014-08-27 DIAGNOSIS — R350 Frequency of micturition: Secondary | ICD-10-CM | POA: Diagnosis not present

## 2014-08-27 DIAGNOSIS — R338 Other retention of urine: Secondary | ICD-10-CM | POA: Insufficient documentation

## 2014-08-27 HISTORY — PX: TRANSURETHRAL RESECTION OF PROSTATE: SHX73

## 2014-08-27 HISTORY — PX: CYSTOSCOPY: SHX5120

## 2014-08-27 LAB — GLUCOSE, CAPILLARY
GLUCOSE-CAPILLARY: 155 mg/dL — AB (ref 70–99)
GLUCOSE-CAPILLARY: 200 mg/dL — AB (ref 70–99)
GLUCOSE-CAPILLARY: 216 mg/dL — AB (ref 70–99)
Glucose-Capillary: 160 mg/dL — ABNORMAL HIGH (ref 70–99)
Glucose-Capillary: 196 mg/dL — ABNORMAL HIGH (ref 70–99)

## 2014-08-27 SURGERY — TRANSURETHRAL RESECTION OF THE PROSTATE WITH GYRUS INSTRUMENTS
Anesthesia: General

## 2014-08-27 MED ORDER — ISOSORBIDE MONONITRATE ER 60 MG PO TB24
60.0000 mg | ORAL_TABLET | Freq: Every day | ORAL | Status: DC
  Administered 2014-08-28: 60 mg via ORAL
  Filled 2014-08-27: qty 1

## 2014-08-27 MED ORDER — ONDANSETRON HCL 4 MG/2ML IJ SOLN: 4.0000 mg | INTRAMUSCULAR | Status: DC | PRN

## 2014-08-27 MED ORDER — METOLAZONE 2.5 MG PO TABS
2.5000 mg | ORAL_TABLET | ORAL | Status: DC
  Filled 2014-08-27: qty 1

## 2014-08-27 MED ORDER — INSULIN ASPART 100 UNIT/ML ~~LOC~~ SOLN
2.0000 [IU] | Freq: Three times a day (TID) | SUBCUTANEOUS | Status: DC
  Administered 2014-08-28 (×2): 2 [IU] via SUBCUTANEOUS

## 2014-08-27 MED ORDER — CIPROFLOXACIN IN D5W 400 MG/200ML IV SOLN
INTRAVENOUS | Status: AC
  Filled 2014-08-27: qty 200

## 2014-08-27 MED ORDER — NITROGLYCERIN 0.4 MG SL SUBL: 0.4000 mg | SUBLINGUAL_TABLET | SUBLINGUAL | Status: DC | PRN

## 2014-08-27 MED ORDER — ALLOPURINOL 100 MG PO TABS
100.0000 mg | ORAL_TABLET | Freq: Two times a day (BID) | ORAL | Status: DC
  Administered 2014-08-27 – 2014-08-28 (×2): 100 mg via ORAL
  Filled 2014-08-27 (×2): qty 1

## 2014-08-27 MED ORDER — METOPROLOL TARTRATE 25 MG PO TABS
25.0000 mg | ORAL_TABLET | Freq: Every day | ORAL | Status: DC
  Administered 2014-08-28: 25 mg via ORAL
  Filled 2014-08-27: qty 1

## 2014-08-27 MED ORDER — POLYETHYLENE GLYCOL 3350 17 G PO PACK: 17.0000 g | PACK | Freq: Two times a day (BID) | ORAL | Status: DC | PRN

## 2014-08-27 MED ORDER — TORSEMIDE 20 MG PO TABS
80.0000 mg | ORAL_TABLET | Freq: Every day | ORAL | Status: DC
  Administered 2014-08-28: 80 mg via ORAL
  Filled 2014-08-27 (×2): qty 4

## 2014-08-27 MED ORDER — FENTANYL CITRATE (PF) 100 MCG/2ML IJ SOLN
INTRAMUSCULAR | Status: AC
  Filled 2014-08-27: qty 2

## 2014-08-27 MED ORDER — SODIUM CHLORIDE 0.9 % IV SOLN
INTRAVENOUS | Status: DC
  Administered 2014-08-27: 10:00:00 via INTRAVENOUS

## 2014-08-27 MED ORDER — INSULIN ASPART 100 UNIT/ML ~~LOC~~ SOLN
9.0000 [IU] | Freq: Three times a day (TID) | SUBCUTANEOUS | Status: DC
  Administered 2014-08-28 (×2): 9 [IU] via SUBCUTANEOUS

## 2014-08-27 MED ORDER — ZOLPIDEM TARTRATE 5 MG PO TABS: 5.0000 mg | ORAL_TABLET | Freq: Every evening | ORAL | Status: DC | PRN

## 2014-08-27 MED ORDER — FENTANYL CITRATE (PF) 100 MCG/2ML IJ SOLN
INTRAMUSCULAR | Status: DC | PRN
  Administered 2014-08-27 (×2): 12.5 ug via INTRAVENOUS
  Administered 2014-08-27: 50 ug via INTRAVENOUS

## 2014-08-27 MED ORDER — TORSEMIDE 20 MG PO TABS
80.0000 mg | ORAL_TABLET | Freq: Once | ORAL | Status: AC
  Administered 2014-08-27: 80 mg via ORAL
  Filled 2014-08-27: qty 4

## 2014-08-27 MED ORDER — SUCCINYLCHOLINE CHLORIDE 20 MG/ML IJ SOLN
INTRAMUSCULAR | Status: DC | PRN
Start: 2014-08-27 — End: 2014-08-27
  Administered 2014-08-27: 100 mg via INTRAVENOUS

## 2014-08-27 MED ORDER — POTASSIUM CHLORIDE CRYS ER 20 MEQ PO TBCR
40.0000 meq | EXTENDED_RELEASE_TABLET | Freq: Every day | ORAL | Status: DC
  Administered 2014-08-27 – 2014-08-28 (×2): 40 meq via ORAL
  Filled 2014-08-27 (×2): qty 2

## 2014-08-27 MED ORDER — SODIUM CHLORIDE 0.9 % IR SOLN
Status: DC | PRN
  Administered 2014-08-27: 18000 mL

## 2014-08-27 MED ORDER — ACETAMINOPHEN 325 MG PO TABS
650.0000 mg | ORAL_TABLET | ORAL | Status: DC | PRN
  Administered 2014-08-28: 650 mg via ORAL
  Filled 2014-08-27: qty 2

## 2014-08-27 MED ORDER — LIDOCAINE HCL (CARDIAC) 20 MG/ML IV SOLN
INTRAVENOUS | Status: DC | PRN
  Administered 2014-08-27: 50 mg via INTRAVENOUS

## 2014-08-27 MED ORDER — INSULIN ASPART 100 UNIT/ML FLEXPEN
2.0000 [IU] | PEN_INJECTOR | Freq: Three times a day (TID) | SUBCUTANEOUS | Status: DC
  Filled 2014-08-27: qty 3

## 2014-08-27 MED ORDER — CIPROFLOXACIN IN D5W 400 MG/200ML IV SOLN
400.0000 mg | Freq: Two times a day (BID) | INTRAVENOUS | Status: AC
  Administered 2014-08-27: 400 mg via INTRAVENOUS
  Filled 2014-08-27: qty 200

## 2014-08-27 MED ORDER — FENTANYL CITRATE (PF) 100 MCG/2ML IJ SOLN: 25.0000 ug | INTRAMUSCULAR | Status: DC | PRN

## 2014-08-27 MED ORDER — LIDOCAINE HCL 2 % EX GEL
CUTANEOUS | Status: AC
  Filled 2014-08-27: qty 10

## 2014-08-27 MED ORDER — ETOMIDATE 2 MG/ML IV SOLN
INTRAVENOUS | Status: DC | PRN
Start: 2014-08-27 — End: 2014-08-27
  Administered 2014-08-27 (×2): 4 mg via INTRAVENOUS

## 2014-08-27 MED ORDER — CIPROFLOXACIN IN D5W 400 MG/200ML IV SOLN
400.0000 mg | INTRAVENOUS | Status: AC
  Administered 2014-08-27: 400 mg via INTRAVENOUS

## 2014-08-27 MED ORDER — LORATADINE 10 MG PO TABS
10.0000 mg | ORAL_TABLET | Freq: Every day | ORAL | Status: DC
  Administered 2014-08-27 – 2014-08-28 (×2): 10 mg via ORAL
  Filled 2014-08-27 (×2): qty 1

## 2014-08-27 MED ORDER — INSULIN ASPART 100 UNIT/ML ~~LOC~~ SOLN: 2.0000 [IU] | Freq: Three times a day (TID) | SUBCUTANEOUS | Status: DC

## 2014-08-27 MED ORDER — INSULIN GLARGINE 100 UNIT/ML ~~LOC~~ SOLN
9.0000 [IU] | Freq: Every day | SUBCUTANEOUS | Status: DC
  Filled 2014-08-27: qty 0.1

## 2014-08-27 MED ORDER — PROPOFOL 10 MG/ML IV BOLUS
INTRAVENOUS | Status: AC
  Filled 2014-08-27: qty 20

## 2014-08-27 MED ORDER — ONDANSETRON HCL 4 MG/2ML IJ SOLN
INTRAMUSCULAR | Status: DC | PRN
  Administered 2014-08-27: 4 mg via INTRAVENOUS

## 2014-08-27 MED ORDER — DEXTROSE-NACL 5-0.2 % IV SOLN: INTRAVENOUS | Status: DC

## 2014-08-27 MED ORDER — BELLADONNA ALKALOIDS-OPIUM 16.2-60 MG RE SUPP
1.0000 | Freq: Four times a day (QID) | RECTAL | Status: DC | PRN
  Administered 2014-08-28: 1 via RECTAL
  Filled 2014-08-27: qty 1

## 2014-08-27 MED ORDER — FERROUS SULFATE 325 (65 FE) MG PO TABS
325.0000 mg | ORAL_TABLET | ORAL | Status: DC
  Administered 2014-08-28: 325 mg via ORAL
  Filled 2014-08-27: qty 1

## 2014-08-27 MED ORDER — INSULIN GLARGINE 100 UNIT/ML ~~LOC~~ SOLN
9.0000 [IU] | Freq: Every day | SUBCUTANEOUS | Status: DC
  Administered 2014-08-27: 9 [IU] via SUBCUTANEOUS
  Filled 2014-08-27: qty 0.09

## 2014-08-27 MED ORDER — AMIODARONE HCL 200 MG PO TABS
200.0000 mg | ORAL_TABLET | Freq: Every day | ORAL | Status: DC
  Administered 2014-08-27: 200 mg via ORAL
  Filled 2014-08-27: qty 1

## 2014-08-27 SURGICAL SUPPLY — 25 items
BAG URINE DRAINAGE (UROLOGICAL SUPPLIES) ×3 IMPLANT
BAG URO CATCHER STRL LF (DRAPE) ×3 IMPLANT
BLADE SURG 15 STRL LF DISP TIS (BLADE) IMPLANT
BLADE SURG 15 STRL SS (BLADE)
CATH FOLEY 3WAY 30CC 24FR (CATHETERS) ×3
CATH ROBINSON RED A/P 16FR (CATHETERS) IMPLANT
CATH URTH STD 24FR FL 3W 2 (CATHETERS) ×1 IMPLANT
CLOTH BEACON ORANGE TIMEOUT ST (SAFETY) ×3 IMPLANT
DRAPE CAMERA CLOSED 9X96 (DRAPES) ×1 IMPLANT
EVACUATOR ELLICK (MISCELLANEOUS) ×1 IMPLANT
GLOVE BIOGEL M 7.0 STRL (GLOVE) ×11 IMPLANT
GOWN STRL REUS W/ TWL XL LVL3 (GOWN DISPOSABLE) ×1 IMPLANT
GOWN STRL REUS W/TWL LRG LVL3 (GOWN DISPOSABLE) ×8 IMPLANT
GOWN STRL REUS W/TWL XL LVL3 (GOWN DISPOSABLE)
HOLDER FOLEY CATH W/STRAP (MISCELLANEOUS) IMPLANT
IV NS IRRIG 3000ML ARTHROMATIC (IV SOLUTION) ×4 IMPLANT
KIT ASPIRATION TUBING (SET/KITS/TRAYS/PACK) ×3 IMPLANT
LOOP CUT BIPOLAR 24F LRG (ELECTROSURGICAL) ×3 IMPLANT
MANIFOLD NEPTUNE II (INSTRUMENTS) ×3 IMPLANT
PACK CYSTO (CUSTOM PROCEDURE TRAY) ×3 IMPLANT
SUT ETHILON 3 0 PS 1 (SUTURE) IMPLANT
SYR 30ML LL (SYRINGE) ×2 IMPLANT
SYRINGE IRR TOOMEY STRL 70CC (SYRINGE) ×2 IMPLANT
TUBING CONNECTING 10 (TUBING) ×2 IMPLANT
TUBING CONNECTING 10' (TUBING) ×1

## 2014-08-27 NOTE — Anesthesia Preprocedure Evaluation (Addendum)
Anesthesia Evaluation  Patient identified by MRN, date of birth, ID band Patient awake    Reviewed: Allergy & Precautions, NPO status , Patient's Chart, lab work & pertinent test results  Airway Mallampati: II  TM Distance: >3 FB Neck ROM: Full    Dental no notable dental hx.    Pulmonary neg pulmonary ROS, pneumonia -, former smoker,  breath sounds clear to auscultation  Pulmonary exam normal       Cardiovascular Exercise Tolerance: Poor hypertension, Pt. on medications and Pt. on home beta blockers + angina + Peripheral Vascular Disease and +CHF + pacemaker Rhythm:Regular Rate:Normal  - Left ventricle: The cavity size was normal. There was moderate concentric hypertrophy. Systolic function was mildly reduced. The estimated ejection fraction was in the range of 45% to 50%. Wall motion was normal; there were no regional wall motion abnormalities. - Aortic valve: Trileaflet; moderately thickened, moderately calcified leaflets. Cusp separation was mildly reduced. Sclerosis without stenosis. Transvalvular velocity was minimally increased. There was no significant regurgitation. Valve area (VTI): 1.27 cm^2. Valve area (Vmax): 1.31 cm^2. - Aortic root: The aortic root was normal in size. - Ascending aorta: The ascending aorta was normal in size. - Mitral valve: There was mild regurgitation. - Left atrium: The atrium was mildly dilated. - Right ventricle: The cavity size was moderately dilated. Wall thickness was normal. Systolic function was mildly reduced. - Right atrium: The atrium was moderately dilated. - Pulmonary arteries: Systolic pressure was severely increased. PA peak pressure: 76 mm Hg (S).  Reviewed Dr. Grayland Ormond cardiology office visit of 08-18-14: Patient at moderate to high risk of CV complications.    Neuro/Psych  Neuromuscular disease negative psych ROS   GI/Hepatic negative GI ROS, Neg  liver ROS,   Endo/Other  diabetes, Insulin Dependent  Renal/GU CRFRenal disease  negative genitourinary   Musculoskeletal  (+) Arthritis -,   Abdominal   Peds negative pediatric ROS (+)  Hematology  (+) anemia ,   Anesthesia Other Findings   Reproductive/Obstetrics negative OB ROS                           Anesthesia Physical Anesthesia Plan  ASA: IV  Anesthesia Plan: General   Post-op Pain Management:    Induction: Intravenous  Airway Management Planned: LMA  Additional Equipment:   Intra-op Plan:   Post-operative Plan: Extubation in OR  Informed Consent: I have reviewed the patients History and Physical, chart, labs and discussed the procedure including the risks, benefits and alternatives for the proposed anesthesia with the patient or authorized representative who has indicated his/her understanding and acceptance.   Dental advisory given  Plan Discussed with: CRNA  Anesthesia Plan Comments: (At high risk for cardiopulmonary complications due to co-morbidities including renal insufficiency, pulmonary hypertension, chronic anticoagulation, PVD, CHF etc. Discussed these high risks with patient and his daughter.  He was admitted twice in March of 2016 for exacerbation of CHF.)      Anesthesia Quick Evaluation

## 2014-08-27 NOTE — Transfer of Care (Signed)
Immediate Anesthesia Transfer of Care Note  Patient: [name]  Procedure(s) Performed: Procedure(s): TRANSURETHRAL RESECTION OF THE PROSTATE WITH GYRUS INSTRUMENTS (N/A) CYSTOSCOPY (N/A)  Patient Location: PACU  Anesthesia Type:General  Level of Consciousness: Patient easily awoken, sedated, comfortable, cooperative, following commands, responds to stimulation.   Airway & Oxygen Therapy: Patient spontaneously breathing, ventilating well, oxygen via simple oxygen mask.  Post-op Assessment: Report given to PACU RN, vital signs reviewed and stable, moving all extremities.   Post vital signs: Reviewed and stable.  Complications: No apparent anesthesia complications

## 2014-08-27 NOTE — Op Note (Signed)
[name] is a 81 y.o.   08/27/2014  General  Preop diagnosis: Urinary retention secondary to BPH  Postop diagnosis: Same  Procedure done: Cystoscopy TURP  Surgeon: Charlene Brooke. [name]  Anesthesia: Gen.  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 He has an indwelling Foley catheter that is draining clear urine. He has a history of CHF. He had a TURP about 11 years ago. Cystoscopy in the office showed regrowth of the median lobe of the prostate with obstruction of the bladder neck. He was cleared for surgery by [name] III. He is  now scheduled for cystoscopy and TURP  Procedure: Patient was identified by his wrist band and proper timeout was taken.  Under general anesthesia he was prepped and draped and placed in the dorsolithotomy position. A panendoscope was inserted in the bladder. The anterior urethra is normal. There is regrowth of the median lobe of the prostate with obstruction of the bladder neck. The bladder is moderately trabeculated. There is no stone or tumor in the bladder. The ureteral orifices are in normal position and shape. The cystoscope was removed. A continuous flow resectoscope was inserted in the bladder. Resection of the median lobe of the prostate was done from the bladder neck to the verumontanum. Hemostasis was secured with electrocautery. The prostatic chips were then irrigated out of the bladder. There was minimal bleeding at the end of the procedure. The resectoscope was then removed. A #24 French Foley catheter was then inserted in the bladder.  The patient tolerated the procedure well and left the OR in satisfactory condition to postanesthesia care unit.  EBL: Minimal

## 2014-08-27 NOTE — Anesthesia Procedure Notes (Signed)
Procedure Name: Intubation Date/Time: 08/27/2014 10:41 AM Performed by: Deliah Boston Pre-anesthesia Checklist: Patient identified, Emergency Drugs available, Suction available and Patient being monitored Patient Re-evaluated:Patient Re-evaluated prior to inductionOxygen Delivery Method: Circle System Utilized Preoxygenation: Pre-oxygenation with 100% oxygen Intubation Type: IV induction Ventilation: Mask ventilation without difficulty Laryngoscope Size: Mac and 4 Grade View: Grade II Tube type: Oral Tube size: 7.5 mm Number of attempts: 1 Airway Equipment and Method: Oral airway Placement Confirmation: ETT inserted through vocal cords under direct vision,  positive ETCO2 and breath sounds checked- equal and bilateral Secured at: 22 cm Tube secured with: Tape Dental Injury: Teeth and Oropharynx as per pre-operative assessment

## 2014-08-27 NOTE — Anesthesia Postprocedure Evaluation (Signed)
  Anesthesia Post-op Note  Patient: [name]  Procedure(s) Performed: Procedure(s) (LRB): TRANSURETHRAL RESECTION OF THE PROSTATE WITH GYRUS INSTRUMENTS (N/A) CYSTOSCOPY (N/A)  Patient Location: PACU  Anesthesia Type: General  Level of Consciousness: awake and alert   Airway and Oxygen Therapy: Patient Spontanous Breathing  Post-op Pain: mild  Post-op Assessment: Post-op Vital signs reviewed, Patient's Cardiovascular Status Stable, Respiratory Function Stable, Patent Airway and No signs of Nausea or vomiting  Last Vitals:  Filed Vitals:   08/27/14 1250  BP: 125/67  Pulse: 66  Temp: 36.9 C  Resp: 18    Post-op Vital Signs: stable   Complications: No apparent anesthesia complications

## 2014-08-27 NOTE — Progress Notes (Signed)
IS given to patient . Pt demonstrated knowledge in the use of it

## 2014-08-27 NOTE — Care Management Note (Signed)
    Page 1 of 1   08/27/2014     2:36:43 PM CARE MANAGEMENT NOTE 08/27/2014  Patient:  [name], [name]   Account Number:  [id number]  Date Initiated:  08/27/2014  Documentation initiated by:  Dessa Phi  Subjective/Objective Assessment:   81 y/o m admitted w/urinary retention.     Action/Plan:   From home.   Anticipated DC Date:  08/28/2014   Anticipated DC Plan:  [place]  CM consult      Choice offered to / List presented to:             Status of service:  In process, will continue to follow Medicare Important Message given?   (If response is "NO", the following Medicare IM given date fields will be blank) Date Medicare IM given:   Medicare IM given by:   Date Additional Medicare IM given:   Additional Medicare IM given by:    Discharge Disposition:    Per UR Regulation:  Reviewed for med. necessity/level of care/duration of stay  If discussed at [place] of Stay Meetings, dates discussed:    Comments:  08/27/14 Dessa Phi RN BSN NCM [phone] s/p cystoscopy. No anticipated d/c needs.

## 2014-08-27 NOTE — Progress Notes (Signed)
Post Op rounds Afebrile.  V/S stable Alert and oriented Tolerates diet well.  No abdominal pain Foley draining well.  Urine pinkish. Satisfactory early post-op. Probable discharge in AM.

## 2014-08-28 DIAGNOSIS — N401 Enlarged prostate with lower urinary tract symptoms: Secondary | ICD-10-CM | POA: Diagnosis not present

## 2014-08-28 LAB — GLUCOSE, CAPILLARY
GLUCOSE-CAPILLARY: 164 mg/dL — AB (ref 70–99)
Glucose-Capillary: 143 mg/dL — ABNORMAL HIGH (ref 70–99)

## 2014-08-28 LAB — BASIC METABOLIC PANEL
Anion gap: 9 (ref 5–15)
BUN: 53 mg/dL — ABNORMAL HIGH (ref 6–23)
CO2: 32 mmol/L (ref 19–32)
CREATININE: 2.99 mg/dL — AB (ref 0.50–1.35)
Calcium: 9.1 mg/dL (ref 8.4–10.5)
Chloride: 93 mmol/L — ABNORMAL LOW (ref 96–112)
GFR calc Af Amer: 19 mL/min — ABNORMAL LOW (ref >90)
GFR, EST NON AFRICAN AMERICAN: 17 mL/min — AB (ref >90)
Glucose, Bld: 177 mg/dL — ABNORMAL HIGH (ref 70–99)
POTASSIUM: 4 mmol/L (ref 3.5–5.1)
Sodium: 134 mmol/L — ABNORMAL LOW (ref 135–145)

## 2014-08-28 LAB — CBC
HCT: 36.1 % — ABNORMAL LOW (ref 39.0–52.0)
Hemoglobin: 11.7 g/dL — ABNORMAL LOW (ref 13.0–17.0)
MCH: 32.5 pg (ref 26.0–34.0)
MCHC: 32.4 g/dL (ref 30.0–36.0)
MCV: 100.3 fL — AB (ref 78.0–100.0)
Platelets: 156 10*3/uL (ref 150–400)
RBC: 3.6 MIL/uL — ABNORMAL LOW (ref 4.22–5.81)
RDW: 15.3 % (ref 11.5–15.5)
WBC: 6.7 10*3/uL (ref 4.0–10.5)

## 2014-08-28 NOTE — Discharge Summary (Signed)
Physician Discharge Summary  Patient ID: [name] MRN: [id number] DOB/AGE: 01-17-1919 81 y.o.  Admit date: 08/27/2014 Discharge date: 08/28/2014  Admission Diagnoses: Urinary retention.  BPH. Diabetes. 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  He had a TURP several years ago.  Cystoscopy showed regrowth of the median lobe.  He had TURP yesterday and was admitted for observation after the procedure.  He is alert and oriented.  He tolerates his diet well.  The catheter is draining rose urine.  He is discharged home today with the catheter in place.  He will be seen in the office next week for catheter removal.  Consults: None  Significant Diagnostic Studies: None  Treatments: Cysto TURP on 4/15.  Discharge Exam: Blood pressure 124/66, pulse 66, temperature 98.1 F (36.7 C), temperature source Oral, resp. rate 20, height 5\' 7"  (1.702 m), weight 80.287 kg (177 lb), SpO2 98 %. Abdomen: Soft, non distended, non tender.  Catheter draining well.  Disposition: 01-Home or Self Care     Medication List    STOP taking these medications        apixaban 2.5 MG Tabs tablet  Commonly known as:  ELIQUIS      TAKE these medications        allopurinol 100 MG tablet  Commonly known as:  ZYLOPRIM  Take 100 mg by mouth 2 (two) times daily.     amiodarone 200 MG tablet  Commonly known as:  PACERONE  Take 1 tablet (200 mg total) by mouth at bedtime.     ciprofloxacin 500 MG tablet  Commonly known as:  CIPRO  Take 500 mg by mouth 2 (two) times daily.     ferrous sulfate 325 (65 FE) MG tablet  Take 325 mg by mouth every other day.     fexofenadine 180 MG tablet  Commonly known as:  ALLEGRA  Take 180 mg by mouth daily.     insulin glargine 100  UNIT/ML injection  Commonly known as:  LANTUS  Inject 9-10 Units into the skin at bedtime. Sliding scale If at lunch gbc is between 159 do not give any. If 299 or above give 3 units. At bedtime it is always 9 unitsComment exceeds the 0 character limit     isosorbide mononitrate 60 MG 24 hr tablet  Commonly known as:  IMDUR  Take 1 tablet (60 mg total) by mouth daily.     metolazone 2.5 MG tablet  Commonly known as:  ZAROXOLYN  Take 2.5 mg by mouth every Monday, Wednesday, and Friday.     metoprolol tartrate 25 MG tablet  Commonly known as:  LOPRESSOR  Take 1 tablet (25 mg total) by mouth daily.     nitroGLYCERIN 0.4 MG SL tablet  Commonly known as:  NITROSTAT  Place 1 tablet (0.4 mg total) under the tongue every 5 (five) minutes as needed for chest pain.     NOVOLOG FLEXPEN 100 UNIT/ML FlexPen  Generic drug:  insulin aspart  - Inject 2-9 Units into the skin 3 (three) times daily with meals. Sliding scale  - If at lunch gbc is between 159 do not give any. If 299 or above give 3 units. At bedtime it is always 9 units     OVER THE  COUNTER MEDICATION  Take 1 packet by mouth daily.     polyethylene glycol packet  Commonly known as:  MIRALAX / GLYCOLAX  Take 17 g by mouth 2 (two) times daily as needed for mild constipation. For constipation     potassium chloride SA 20 MEQ tablet  Commonly known as:  K-DUR,KLOR-CON  Take 2 tablets (40 mEq total) by mouth daily.     REFRESH OP  Place 1 drop into both eyes 2 (two) times daily. Refresh Gel Opth     sulfamethoxazole-trimethoprim 800-160 MG per tablet  Commonly known as:  BACTRIM DS,SEPTRA DS  Take 1 tablet by mouth 2 (two) times daily.     torsemide 20 MG tablet  Commonly known as:  DEMADEX  Take 4 tablets (80 mg total) by mouth daily.     ZIOPTAN 0.0015 % Soln  Generic drug:  Tafluprost  Place 1 drop into both eyes at bedtime. Use at night      ASK your doctor about these medications        chlorpheniramine-HYDROcodone  10-8 MG/5ML Lqcr  Commonly known as:  TUSSIONEX PENNKINETIC ER  Take 5 mLs by mouth at bedtime as needed for cough.         Signed: Arvil Persons 08/28/2014, 2:22 PM

## 2014-08-30 ENCOUNTER — Encounter (HOSPITAL_COMMUNITY): Payer: Self-pay | Admitting: Urology

## 2014-09-01 ENCOUNTER — Other Ambulatory Visit (INDEPENDENT_AMBULATORY_CARE_PROVIDER_SITE_OTHER): Payer: Medicare Other | Admitting: *Deleted

## 2014-09-01 ENCOUNTER — Ambulatory Visit (INDEPENDENT_AMBULATORY_CARE_PROVIDER_SITE_OTHER): Payer: Medicare Other | Admitting: *Deleted

## 2014-09-01 ENCOUNTER — Ambulatory Visit (INDEPENDENT_AMBULATORY_CARE_PROVIDER_SITE_OTHER): Payer: Medicare Other | Admitting: Interventional Cardiology

## 2014-09-01 ENCOUNTER — Encounter: Payer: Self-pay | Admitting: Interventional Cardiology

## 2014-09-01 VITALS — BP 110/68 | HR 52 | Wt 176.8 lb

## 2014-09-01 DIAGNOSIS — Z950 Presence of cardiac pacemaker: Secondary | ICD-10-CM

## 2014-09-01 DIAGNOSIS — Z7901 Long term (current) use of anticoagulants: Secondary | ICD-10-CM

## 2014-09-01 DIAGNOSIS — I208 Other forms of angina pectoris: Secondary | ICD-10-CM | POA: Diagnosis not present

## 2014-09-01 DIAGNOSIS — I5032 Chronic diastolic (congestive) heart failure: Secondary | ICD-10-CM

## 2014-09-01 DIAGNOSIS — I480 Paroxysmal atrial fibrillation: Secondary | ICD-10-CM | POA: Diagnosis not present

## 2014-09-01 DIAGNOSIS — I10 Essential (primary) hypertension: Secondary | ICD-10-CM

## 2014-09-01 DIAGNOSIS — N184 Chronic kidney disease, stage 4 (severe): Secondary | ICD-10-CM | POA: Diagnosis not present

## 2014-09-01 DIAGNOSIS — I495 Sick sinus syndrome: Secondary | ICD-10-CM

## 2014-09-01 DIAGNOSIS — Z79899 Other long term (current) drug therapy: Secondary | ICD-10-CM

## 2014-09-01 LAB — BASIC METABOLIC PANEL
BUN: 59 mg/dL — AB (ref 6–23)
CO2: 35 mEq/L — ABNORMAL HIGH (ref 19–32)
Calcium: 10 mg/dL (ref 8.4–10.5)
Chloride: 95 mEq/L — ABNORMAL LOW (ref 96–112)
Creatinine, Ser: 3.85 mg/dL — ABNORMAL HIGH (ref 0.40–1.50)
GFR: 18.88 mL/min — ABNORMAL LOW (ref >60.00)
Glucose, Bld: 59 mg/dL — ABNORMAL LOW (ref 70–99)
Potassium: 4.5 mEq/L (ref 3.5–5.1)
Sodium: 134 mEq/L — ABNORMAL LOW (ref 135–145)

## 2014-09-01 NOTE — Addendum Note (Signed)
Addended by: Eulis Foster on: 09/01/2014 04:05 PM   Modules accepted: Orders

## 2014-09-01 NOTE — Progress Notes (Signed)
Cardiology Office Note   Date:  09/01/2014   ID:  [name], DOB 01/14/1919, MRN [id number]  PCP:  [name], MD  Cardiologist:   [name], MD   Chief Complaint  Patient presents with  . Congestive Heart Failure      History of Present Illness: [name] is a 81 y.o. male who presents for  Paroxysmal atrial fibrillation, stage IV chronic kidney disease, hypertension, coronary artery disease, class D diastolic heart failure , an chronic anticoagulation with apixaban. Recent  Prostatectomy for BPH.   Dr. B is doing relatively well but is somewhat weak and has decreased appetite since prostatectomy was performed because of recurring episodes of urinary retention. The procedure was performed on Thursday and he was discharged from the hospital on Friday. He is done well at home. Still having some hematuria. He has been weak and unable to stand. He denies dyspnea. No chest discomfort. A recent creatinine done by Dr. Honor Loh revealed a creatinine greater than 3.0.    Past Medical History  Diagnosis Date  . Hyperlipidemia   . Spinal stenosis   . Anemia   . Carotid artery disease     a. s/p prior carotid endarterectomy.  [name] PVD (peripheral vascular disease)   . Hyponatremia     Chronic  . CVA (cerebral infarction)     a. When asked to clarify patient says he was told thumb numbness may be TIA. He does not recall formal stroke. CT head results are only available through [facility], not in [facility].  . CKD (chronic kidney disease) stage 4, GFR 15-29 ml/min   . Long term (current) use of anticoagulants   . PAF (paroxysmal atrial fibrillation)     a. Asymptomatic. On Eliquis. CHADSVASC 7.  . Sick sinus syndrome     With DDD St. Jude PM, initially placed in 1993 by Dr. Nils Pyle. Device upgrade 10/2002 to DDD, by Dr. Rollene Fare complicated by bleeding. Subsequent gen change 2011.  [name] Hypertensive cardiovascular disease   . Chronic diastolic congestive heart failure     ECHO  05/20/12 LVEF estimated by 2D at 55-60%  . Arthritis   . Gait disorder   . Diabetic peripheral neuropathy associated with type 2 diabetes mellitus   . Diabetes mellitus     insulin dependent  . Elevated troponin I level 06/22/2014    a. 06/2014 felt due to demand ischemia.  . Chronic anticoagulation     For PAF, on Eliquis   . Urinary retention   . Pulmonary hypertension   . Foot drop, left 07/21/2014  . Asterixis 07/21/2014  . Hypertension   . Presence of permanent cardiac pacemaker   . Pneumonia     hx of pneumonia x 1     Past Surgical History  Procedure Laterality Date  . Lumbar laminectomy  1995  . Back surgery    . Total knee arthroplasty Left   . Quadriceps tendon repair    . Cataract extraction    . Carotid endarterectomy    . Yag laser application Right 0/21/1155    Procedure: YAG LASER CAPSULOTOMY OF RIGHT EYE;  Surgeon: [name], MD;  Location: [place];  Service: Ophthalmology;  Laterality: Right;  . Tonsillectomy    . Pacemaker insertion      DDD St. Jude PM, initially placed in 1993 by Dr. Nils Pyle. Device upgrade 10/2002 to DDD, by Dr. Rollene Fare complicated by bleeding.  . Carotid endarterectomy Right 2006  . Transurethral  resection of prostate    . Cardioversion N/A 06/16/2013    Procedure: CARDIOVERSION BEDSIDE;  Surgeon: [name], MD;  Location: [place] [facility];  Service: Cardiovascular;  Laterality: N/A;  . Pacemaker generator change  12/09/2009    SJM Accent DR RF gen change by Dr [name]  . Insert / replace / remove pacemaker      2004   . Transurethral resection of prostate N/A 08/27/2014    Procedure: TRANSURETHRAL RESECTION OF THE PROSTATE WITH GYRUS INSTRUMENTS;  Surgeon: [name], MD;  Location: [facility];  Service: Urology;  Laterality: N/A;  . Cystoscopy N/A 08/27/2014    Procedure: CYSTOSCOPY;  Surgeon: [name], MD;  Location: [facility];  Service: Urology;  Laterality: N/A;     Current Outpatient Prescriptions  Medication Sig Dispense Refill  .  allopurinol (ZYLOPRIM) 100 MG tablet Take 100 mg by mouth 2 (two) times daily.     [name] amiodarone (PACERONE) 200 MG tablet Take 1 tablet (200 mg total) by mouth at bedtime.    . ferrous sulfate 325 (65 FE) MG tablet Take 325 mg by mouth every other day.    . fexofenadine (ALLEGRA) 180 MG tablet Take 180 mg by mouth daily.    . insulin aspart (NOVOLOG FLEXPEN) 100 UNIT/ML FlexPen Inject 2-9 Units into the skin 3 (three) times daily with meals. Sliding scale If at lunch gbc is between 159 do not give any. If 299 or above give 3 units. At bedtime it is always 9 units    . insulin glargine (LANTUS) 100 UNIT/ML injection Inject 9-10 Units into the skin at bedtime. Sliding scale If at lunch gbc is between 159 do not give any. If 299 or above give 3 units. At bedtime it is always 9 unitsComment exceeds the 0 character limit    . isosorbide mononitrate (IMDUR) 60 MG 24 hr tablet Take 1 tablet (60 mg total) by mouth daily. 30 tablet 5  . metolazone (ZAROXOLYN) 2.5 MG tablet Take 2.5 mg by mouth every Monday, Wednesday, and Friday.    . metoprolol tartrate (LOPRESSOR) 25 MG tablet Take 1 tablet (25 mg total) by mouth daily. 30 tablet 6  . nitroGLYCERIN (NITROSTAT) 0.4 MG SL tablet Place 1 tablet (0.4 mg total) under the tongue every 5 (five) minutes as needed for chest pain. 25 tablet 3  . OVER THE COUNTER MEDICATION Take 1 packet by mouth daily.    . polyethylene glycol (MIRALAX / GLYCOLAX) packet Take 17 g by mouth 2 (two) times daily as needed for mild constipation. For constipation    . Polyvinyl Alcohol-Povidone (REFRESH OP) Place 1 drop into both eyes 2 (two) times daily. Refresh Gel Opth    . potassium chloride SA (K-DUR,KLOR-CON) 20 MEQ tablet Take 2 tablets (40 mEq total) by mouth daily. (Patient taking differently: Take 20 mEq by mouth daily. ) 30 tablet 6  . Tafluprost (ZIOPTAN) 0.0015 % SOLN Place 1 drop into both eyes at bedtime. Use at night    . torsemide (DEMADEX) 20 MG tablet Take 4 tablets (80  mg total) by mouth daily. 120 tablet 3   No current facility-administered medications for this visit.    Allergies:   Statins; Aggrenox; Carvedilol; Claritin; Cymbalta; Mucinex; Plavix; Rapaflo; and Travatan z    Social History:  The patient  reports that he quit smoking about 56 years ago. His smoking use included Pipe and Cigarettes. He quit after 2 years of use. He has never used smokeless tobacco. He reports  that he does not drink alcohol or use illicit drugs.   Family History:  The patient's family history includes Hypertension in his mother; Multiple myeloma in his father.    ROS:  Please see the history of present illness.   Otherwise, review of systems are positive for  Weak, decreased appetite, thirsty..   All other systems are reviewed and negative.    PHYSICAL EXAM: VS:  BP 110/68 mmHg  Pulse 52  Wt 176 lb 12.8 oz (80.196 kg)  SpO2 93% , BMI Body mass index is 27.68 kg/(m^2). GEN: Well nourished, well developed, in no acute distress HEENT: normal Neck: no JVD, carotid bruits, or masses. Cardiac: RRR; no murmurs, rubs, or gallops,no edema  Respiratory: normal work of breathing. Faint left basilar rales that cleared with deep breaths and cough. GI: soft, nontender, nondistended, + BS MS: no deformity or atrophy Skin: warm and dry, no rash Neuro:  Strength and sensation are intact Psych: euthymic mood, full affect   EKG:  EKG is not ordered today.   Recent Labs: 06/22/2014: Magnesium 2.4 07/16/2014: Pro B Natriuretic peptide (BNP) 460.0*; TSH 1.050 07/23/2014: B Natriuretic Peptide 359.1* 07/24/2014: ALT 41 08/28/2014: BUN 53*; Creatinine 2.99*; Hemoglobin 11.7*; Platelets 156; Potassium 4.0; Sodium 134*    Lipid Panel No results found for: CHOL, TRIG, HDL, CHOLHDL, VLDL, LDLCALC, LDLDIRECT    Wt Readings from Last 3 Encounters:  09/01/14 176 lb 12.8 oz (80.196 kg)  08/27/14 177 lb (80.287 kg)  08/18/14 178 lb 3.2 oz (80.831 kg)      Other studies  Reviewed: Additional studies/ records that were reviewed today include: .   ASSESSMENT AND PLAN:  Chronic diastolic heart failure:  No evidence of volume overload. No respiratory distress. Currently: Diuretic until renal function improves. I feel there is a component of volume contraction.  CKD (chronic kidney disease) stage 4, GFR 15-29 ml/min : Most recent creatinine was greater than 3.0 done at [facility] hypertension : Relatively low  Paroxysmal atrial fibrillation, DCCV 06/16/13  Pacemaker - St Jude  Chronic anticoagulation  Long term current use of amiodarone     Current medicines are reviewed at length with the patient today.  The patient does not have concerns regarding medicines.  The following changes have been made:   Hold Demadex and potassium supplements. Basic metabolic panel was obtained today. Resumption of diuretic therapy will occur once he rehydrates.  Labs/ tests ordered today include:  No orders of the defined types were placed in this encounter.     Disposition:   FU with HS in 1 week  With a metabolic panel  Signed, [name], MD  09/01/2014 5:09 PM    [facility] [address], [place], [place]  [address] Phone: [phone]; Fax: [phone]

## 2014-09-01 NOTE — Progress Notes (Signed)
Remote pacemaker transmission.   

## 2014-09-01 NOTE — Patient Instructions (Signed)
Medication Instructions:  HOLD Eliquis  Labwork: Your physician recommends that you return for lab work in: 1 week (Bmet)  Testing/Procedures:   Follow-Up: You have a follow up appointment on 09/08/14 @ 9am  Any Other Special Instructions Will Be Listed Below (If Applicable). Dr.Smith will call you tomorrow morning with further instructions

## 2014-09-02 ENCOUNTER — Inpatient Hospital Stay (HOSPITAL_COMMUNITY)
Admission: EM | Admit: 2014-09-02 | Discharge: 2014-09-08 | DRG: 682 | Disposition: A | Discharge status: Skilled Nursing Facility | Payer: Medicare Other | Attending: Internal Medicine | Admitting: Internal Medicine

## 2014-09-02 ENCOUNTER — Telehealth: Payer: Self-pay

## 2014-09-02 ENCOUNTER — Inpatient Hospital Stay (HOSPITAL_COMMUNITY): Payer: Medicare Other

## 2014-09-02 ENCOUNTER — Encounter (HOSPITAL_COMMUNITY): Payer: Self-pay | Admitting: Emergency Medicine

## 2014-09-02 DIAGNOSIS — G9341 Metabolic encephalopathy: Secondary | ICD-10-CM | POA: Diagnosis present

## 2014-09-02 DIAGNOSIS — E875 Hyperkalemia: Secondary | ICD-10-CM | POA: Diagnosis present

## 2014-09-02 DIAGNOSIS — IMO0002 Reserved for concepts with insufficient information to code with codable children: Secondary | ICD-10-CM | POA: Diagnosis present

## 2014-09-02 DIAGNOSIS — D649 Anemia, unspecified: Secondary | ICD-10-CM | POA: Diagnosis present

## 2014-09-02 DIAGNOSIS — R319 Hematuria, unspecified: Secondary | ICD-10-CM

## 2014-09-02 DIAGNOSIS — I480 Paroxysmal atrial fibrillation: Secondary | ICD-10-CM | POA: Diagnosis not present

## 2014-09-02 DIAGNOSIS — E785 Hyperlipidemia, unspecified: Secondary | ICD-10-CM | POA: Diagnosis present

## 2014-09-02 DIAGNOSIS — I5033 Acute on chronic diastolic (congestive) heart failure: Secondary | ICD-10-CM | POA: Diagnosis present

## 2014-09-02 DIAGNOSIS — R7989 Other specified abnormal findings of blood chemistry: Secondary | ICD-10-CM

## 2014-09-02 DIAGNOSIS — I5023 Acute on chronic systolic (congestive) heart failure: Secondary | ICD-10-CM

## 2014-09-02 DIAGNOSIS — N179 Acute kidney failure, unspecified: Secondary | ICD-10-CM | POA: Diagnosis present

## 2014-09-02 DIAGNOSIS — Z96652 Presence of left artificial knee joint: Secondary | ICD-10-CM | POA: Diagnosis present

## 2014-09-02 DIAGNOSIS — I2510 Atherosclerotic heart disease of native coronary artery without angina pectoris: Secondary | ICD-10-CM | POA: Diagnosis present

## 2014-09-02 DIAGNOSIS — R310 Gross hematuria: Secondary | ICD-10-CM | POA: Diagnosis present

## 2014-09-02 DIAGNOSIS — Z87891 Personal history of nicotine dependence: Secondary | ICD-10-CM | POA: Diagnosis not present

## 2014-09-02 DIAGNOSIS — Z950 Presence of cardiac pacemaker: Secondary | ICD-10-CM | POA: Diagnosis not present

## 2014-09-02 DIAGNOSIS — Z8249 Family history of ischemic heart disease and other diseases of the circulatory system: Secondary | ICD-10-CM

## 2014-09-02 DIAGNOSIS — Z807 Family history of other malignant neoplasms of lymphoid, hematopoietic and related tissues: Secondary | ICD-10-CM | POA: Diagnosis not present

## 2014-09-02 DIAGNOSIS — Z794 Long term (current) use of insulin: Secondary | ICD-10-CM

## 2014-09-02 DIAGNOSIS — G9340 Encephalopathy, unspecified: Secondary | ICD-10-CM | POA: Diagnosis not present

## 2014-09-02 DIAGNOSIS — I129 Hypertensive chronic kidney disease with stage 1 through stage 4 chronic kidney disease, or unspecified chronic kidney disease: Secondary | ICD-10-CM | POA: Diagnosis present

## 2014-09-02 DIAGNOSIS — R778 Other specified abnormalities of plasma proteins: Secondary | ICD-10-CM

## 2014-09-02 DIAGNOSIS — Z79899 Other long term (current) drug therapy: Secondary | ICD-10-CM | POA: Diagnosis not present

## 2014-09-02 DIAGNOSIS — N184 Chronic kidney disease, stage 4 (severe): Secondary | ICD-10-CM | POA: Diagnosis present

## 2014-09-02 DIAGNOSIS — N178 Other acute kidney failure: Secondary | ICD-10-CM | POA: Diagnosis present

## 2014-09-02 DIAGNOSIS — I509 Heart failure, unspecified: Secondary | ICD-10-CM | POA: Insufficient documentation

## 2014-09-02 DIAGNOSIS — N141 Nephropathy induced by other drugs, medicaments and biological substances: Secondary | ICD-10-CM | POA: Diagnosis present

## 2014-09-02 DIAGNOSIS — Z8673 Personal history of transient ischemic attack (TIA), and cerebral infarction without residual deficits: Secondary | ICD-10-CM | POA: Diagnosis not present

## 2014-09-02 DIAGNOSIS — E876 Hypokalemia: Secondary | ICD-10-CM | POA: Diagnosis present

## 2014-09-02 DIAGNOSIS — N189 Chronic kidney disease, unspecified: Secondary | ICD-10-CM | POA: Diagnosis not present

## 2014-09-02 DIAGNOSIS — I739 Peripheral vascular disease, unspecified: Secondary | ICD-10-CM | POA: Diagnosis present

## 2014-09-02 DIAGNOSIS — T502X5A Adverse effect of carbonic-anhydrase inhibitors, benzothiadiazides and other diuretics, initial encounter: Secondary | ICD-10-CM | POA: Diagnosis present

## 2014-09-02 DIAGNOSIS — I272 Other secondary pulmonary hypertension: Secondary | ICD-10-CM | POA: Diagnosis present

## 2014-09-02 DIAGNOSIS — Z7901 Long term (current) use of anticoagulants: Secondary | ICD-10-CM | POA: Diagnosis not present

## 2014-09-02 DIAGNOSIS — I10 Essential (primary) hypertension: Secondary | ICD-10-CM | POA: Diagnosis not present

## 2014-09-02 DIAGNOSIS — E1142 Type 2 diabetes mellitus with diabetic polyneuropathy: Secondary | ICD-10-CM | POA: Diagnosis present

## 2014-09-02 DIAGNOSIS — E1165 Type 2 diabetes mellitus with hyperglycemia: Secondary | ICD-10-CM | POA: Diagnosis present

## 2014-09-02 DIAGNOSIS — R531 Weakness: Secondary | ICD-10-CM | POA: Diagnosis not present

## 2014-09-02 DIAGNOSIS — E860 Dehydration: Secondary | ICD-10-CM | POA: Diagnosis present

## 2014-09-02 LAB — TROPONIN I
Troponin I: 0.21 ng/mL — ABNORMAL HIGH (ref <0.031)
Troponin I: 0.25 ng/mL — ABNORMAL HIGH (ref <0.031)

## 2014-09-02 LAB — URINALYSIS, ROUTINE W REFLEX MICROSCOPIC
Bilirubin Urine: NEGATIVE
Glucose, UA: NEGATIVE mg/dL
KETONES UR: NEGATIVE mg/dL
NITRITE: NEGATIVE
PROTEIN: 30 mg/dL — AB
Specific Gravity, Urine: 1.017 (ref 1.005–1.030)
Urobilinogen, UA: 1 mg/dL (ref 0.0–1.0)
pH: 6 (ref 5.0–8.0)

## 2014-09-02 LAB — BASIC METABOLIC PANEL
ANION GAP: 9 (ref 5–15)
ANION GAP: 10 (ref 5–15)
BUN: 67 mg/dL — AB (ref 6–23)
BUN: 64 mg/dL — AB (ref 6–23)
CALCIUM: 9.9 mg/dL (ref 8.4–10.5)
CHLORIDE: 97 mmol/L (ref 96–112)
CO2: 29 mmol/L (ref 19–32)
CO2: 27 mmol/L (ref 19–32)
CREATININE: 4.15 mg/dL — AB (ref 0.50–1.35)
Calcium: 9.2 mg/dL (ref 8.4–10.5)
Chloride: 95 mmol/L — ABNORMAL LOW (ref 96–112)
Creatinine, Ser: 3.71 mg/dL — ABNORMAL HIGH (ref 0.50–1.35)
GFR calc non Af Amer: 11 mL/min — ABNORMAL LOW (ref >90)
GFR calc non Af Amer: 13 mL/min — ABNORMAL LOW (ref >90)
GFR, EST AFRICAN AMERICAN: 13 mL/min — AB (ref >90)
GFR, EST AFRICAN AMERICAN: 15 mL/min — AB (ref >90)
Glucose, Bld: 295 mg/dL — ABNORMAL HIGH (ref 70–99)
Glucose, Bld: 265 mg/dL — ABNORMAL HIGH (ref 70–99)
POTASSIUM: 4.9 mmol/L (ref 3.5–5.1)
Potassium: 5.5 mmol/L — ABNORMAL HIGH (ref 3.5–5.1)
Sodium: 133 mmol/L — ABNORMAL LOW (ref 135–145)
Sodium: 134 mmol/L — ABNORMAL LOW (ref 135–145)

## 2014-09-02 LAB — CBC WITH DIFFERENTIAL/PLATELET
BASOS PCT: 0 % (ref 0–1)
Basophils Absolute: 0 10*3/uL (ref 0.0–0.1)
Eosinophils Absolute: 0 10*3/uL (ref 0.0–0.7)
Eosinophils Relative: 0 % (ref 0–5)
HCT: 38.1 % — ABNORMAL LOW (ref 39.0–52.0)
Hemoglobin: 12.5 g/dL — ABNORMAL LOW (ref 13.0–17.0)
Lymphocytes Relative: 8 % — ABNORMAL LOW (ref 12–46)
Lymphs Abs: 0.6 10*3/uL — ABNORMAL LOW (ref 0.7–4.0)
MCH: 32.6 pg (ref 26.0–34.0)
MCHC: 32.8 g/dL (ref 30.0–36.0)
MCV: 99.5 fL (ref 78.0–100.0)
MONOS PCT: 2 % — AB (ref 3–12)
Monocytes Absolute: 0.2 10*3/uL (ref 0.1–1.0)
NEUTROS ABS: 6.2 10*3/uL (ref 1.7–7.7)
NEUTROS PCT: 90 % — AB (ref 43–77)
Platelets: 218 10*3/uL (ref 150–400)
RBC: 3.83 MIL/uL — AB (ref 4.22–5.81)
RDW: 16 % — ABNORMAL HIGH (ref 11.5–15.5)
WBC: 6.9 10*3/uL (ref 4.0–10.5)

## 2014-09-02 LAB — CREATININE, URINE, RANDOM: Creatinine, Urine: 97.37 mg/dL

## 2014-09-02 LAB — GLUCOSE, CAPILLARY
GLUCOSE-CAPILLARY: 302 mg/dL — AB (ref 70–99)
Glucose-Capillary: 224 mg/dL — ABNORMAL HIGH (ref 70–99)
Glucose-Capillary: 270 mg/dL — ABNORMAL HIGH (ref 70–99)
Glucose-Capillary: 302 mg/dL — ABNORMAL HIGH (ref 70–99)

## 2014-09-02 LAB — URINE MICROSCOPIC-ADD ON

## 2014-09-02 LAB — SODIUM, URINE, RANDOM: Sodium, Ur: 26 mmol/L

## 2014-09-02 MED ORDER — POLYETHYLENE GLYCOL 3350 17 G PO PACK
17.0000 g | PACK | Freq: Two times a day (BID) | ORAL | Status: DC | PRN
  Administered 2014-09-02 – 2014-09-03 (×2): 17 g via ORAL
  Filled 2014-09-02 (×2): qty 1

## 2014-09-02 MED ORDER — INSULIN GLARGINE 100 UNIT/ML ~~LOC~~ SOLN
8.0000 [IU] | Freq: Every day | SUBCUTANEOUS | Status: DC
  Administered 2014-09-02: 8 [IU] via SUBCUTANEOUS
  Filled 2014-09-02: qty 0.08

## 2014-09-02 MED ORDER — ACETAMINOPHEN 325 MG PO TABS
650.0000 mg | ORAL_TABLET | Freq: Four times a day (QID) | ORAL | Status: DC | PRN
  Administered 2014-09-08: 650 mg via ORAL
  Filled 2014-09-02: qty 2

## 2014-09-02 MED ORDER — INSULIN ASPART 100 UNIT/ML ~~LOC~~ SOLN
0.0000 [IU] | Freq: Three times a day (TID) | SUBCUTANEOUS | Status: DC
  Administered 2014-09-02: 8 [IU] via SUBCUTANEOUS
  Administered 2014-09-03: 3 [IU] via SUBCUTANEOUS
  Administered 2014-09-03 – 2014-09-04 (×3): 5 [IU] via SUBCUTANEOUS
  Administered 2014-09-04 – 2014-09-05 (×3): 3 [IU] via SUBCUTANEOUS
  Administered 2014-09-05: 5 [IU] via SUBCUTANEOUS
  Administered 2014-09-05: 3 [IU] via SUBCUTANEOUS
  Administered 2014-09-06: 8 [IU] via SUBCUTANEOUS
  Administered 2014-09-06: 3 [IU] via SUBCUTANEOUS
  Administered 2014-09-07 (×2): 8 [IU] via SUBCUTANEOUS
  Administered 2014-09-07: 2 [IU] via SUBCUTANEOUS
  Administered 2014-09-08: 8 [IU] via SUBCUTANEOUS

## 2014-09-02 MED ORDER — INSULIN ASPART 100 UNIT/ML ~~LOC~~ SOLN
0.0000 [IU] | Freq: Three times a day (TID) | SUBCUTANEOUS | Status: DC
  Administered 2014-09-02: 3 [IU] via SUBCUTANEOUS
  Administered 2014-09-02: 7 [IU] via SUBCUTANEOUS

## 2014-09-02 MED ORDER — APIXABAN 2.5 MG PO TABS: 2.5000 mg | ORAL_TABLET | Freq: Two times a day (BID) | ORAL | Status: DC

## 2014-09-02 MED ORDER — ONDANSETRON HCL 4 MG/2ML IJ SOLN: 4.0000 mg | Freq: Four times a day (QID) | INTRAMUSCULAR | Status: DC | PRN

## 2014-09-02 MED ORDER — METOPROLOL TARTRATE 25 MG PO TABS
25.0000 mg | ORAL_TABLET | Freq: Every day | ORAL | Status: DC
  Administered 2014-09-02 – 2014-09-08 (×7): 25 mg via ORAL
  Filled 2014-09-02 (×7): qty 1

## 2014-09-02 MED ORDER — ACETAMINOPHEN 650 MG RE SUPP: 650.0000 mg | Freq: Four times a day (QID) | RECTAL | Status: DC | PRN

## 2014-09-02 MED ORDER — INSULIN ASPART 100 UNIT/ML ~~LOC~~ SOLN
0.0000 [IU] | Freq: Every day | SUBCUTANEOUS | Status: DC
  Administered 2014-09-02: 4 [IU] via SUBCUTANEOUS
  Administered 2014-09-03: 3 [IU] via SUBCUTANEOUS

## 2014-09-02 MED ORDER — PROMETHAZINE HCL 25 MG/ML IJ SOLN
12.5000 mg | Freq: Four times a day (QID) | INTRAMUSCULAR | Status: DC | PRN
  Administered 2014-09-02: 12.5 mg via INTRAVENOUS
  Filled 2014-09-02: qty 1

## 2014-09-02 MED ORDER — SODIUM CHLORIDE 0.9 % IV SOLN
INTRAVENOUS | Status: DC
  Administered 2014-09-02: 05:00:00 via INTRAVENOUS

## 2014-09-02 MED ORDER — FERROUS SULFATE 325 (65 FE) MG PO TABS
325.0000 mg | ORAL_TABLET | ORAL | Status: DC
  Administered 2014-09-03 – 2014-09-07 (×3): 325 mg via ORAL
  Filled 2014-09-02 (×4): qty 1

## 2014-09-02 MED ORDER — LIDOCAINE HCL 2 % EX GEL
CUTANEOUS | Status: AC
Start: 2014-09-02 — End: 2014-09-02
  Administered 2014-09-02: 10
  Filled 2014-09-02: qty 10

## 2014-09-02 MED ORDER — NITROGLYCERIN 0.4 MG SL SUBL: 0.4000 mg | SUBLINGUAL_TABLET | SUBLINGUAL | Status: DC | PRN

## 2014-09-02 MED ORDER — INSULIN GLARGINE 100 UNIT/ML ~~LOC~~ SOLN
6.0000 [IU] | Freq: Every day | SUBCUTANEOUS | Status: DC
Start: 2014-09-02 — End: 2014-09-02

## 2014-09-02 MED ORDER — LORATADINE 10 MG PO TABS: 10.0000 mg | ORAL_TABLET | Freq: Every day | ORAL | Status: DC

## 2014-09-02 MED ORDER — LATANOPROST 0.005 % OP SOLN
1.0000 [drp] | Freq: Every day | OPHTHALMIC | Status: DC
  Administered 2014-09-04 – 2014-09-07 (×4): 1 [drp] via OPHTHALMIC
  Filled 2014-09-02 (×2): qty 2.5

## 2014-09-02 MED ORDER — ONDANSETRON HCL 4 MG PO TABS: 4.0000 mg | ORAL_TABLET | Freq: Four times a day (QID) | ORAL | Status: DC | PRN

## 2014-09-02 MED ORDER — DEXTROSE 5 % IV SOLN
1.0000 g | Freq: Once | INTRAVENOUS | Status: AC
  Administered 2014-09-02: 1 g via INTRAVENOUS
  Filled 2014-09-02: qty 10

## 2014-09-02 MED ORDER — HEPARIN SODIUM (PORCINE) 5000 UNIT/ML IJ SOLN
5000.0000 [IU] | Freq: Three times a day (TID) | INTRAMUSCULAR | Status: DC
  Administered 2014-09-02 – 2014-09-03 (×3): 5000 [IU] via SUBCUTANEOUS
  Filled 2014-09-02 (×3): qty 1

## 2014-09-02 MED ORDER — AMIODARONE HCL 200 MG PO TABS
200.0000 mg | ORAL_TABLET | Freq: Every day | ORAL | Status: DC
  Administered 2014-09-02 – 2014-09-07 (×6): 200 mg via ORAL
  Filled 2014-09-02 (×6): qty 1

## 2014-09-02 MED ORDER — ALLOPURINOL 100 MG PO TABS
100.0000 mg | ORAL_TABLET | Freq: Two times a day (BID) | ORAL | Status: DC
  Administered 2014-09-02 – 2014-09-08 (×13): 100 mg via ORAL
  Filled 2014-09-02 (×13): qty 1

## 2014-09-02 MED ORDER — SODIUM CHLORIDE 0.9 % IV SOLN
INTRAVENOUS | Status: AC
  Administered 2014-09-02 – 2014-09-03 (×4): via INTRAVENOUS

## 2014-09-02 MED ORDER — SODIUM CHLORIDE 0.9 % IV SOLN: INTRAVENOUS | Status: AC

## 2014-09-02 MED ORDER — ISOSORBIDE MONONITRATE ER 30 MG PO TB24
60.0000 mg | ORAL_TABLET | Freq: Every day | ORAL | Status: DC
  Administered 2014-09-02 – 2014-09-08 (×7): 60 mg via ORAL
  Filled 2014-09-02 (×7): qty 2

## 2014-09-02 NOTE — ED Notes (Signed)
Bladder scanner performed 142 ml noted

## 2014-09-02 NOTE — ED Notes (Signed)
Per EMS pt had surgery last week and had a catheter in place for that  Pt states since the catheter was removed he has had difficulty voiding  Pt is c/o cramping in the bladder area  Pt states he has been weaker than normal and unable to ambulate like normal  Pt was recently taken off his diuretic and now he is feeling short of breath with exertion

## 2014-09-02 NOTE — Progress Notes (Addendum)
[facility] Progress Note   [name] VZD:[id number] DOB: Nov 02, 1918 DOA: 09/02/2014 PCP: [name], MD  Brief narrative: [name] is a 81 y.o. male with past medical history of CAD, CKD, paroxysmal atrial fibrillation on anticoagulation, chronic diastolic heart failure presents to the hospital for generalized weakness. He had a TURP last week and Foley versus removed on Monday. Since then the patient states that he's had a very small amount of urine output with dark urine. He states that he's been feeling lightheaded when he gets up from bed in the morning. In the ER he is found to be dehydrated both clinically and based on lab work. He takes Zaroxolyn and torsemide at home which were held yesterday. He has also been having hematuria and apixaban has been on hold.   Subjective: Sitting on bedside commode when evaluated, no dizziness. Tells me that his legs have been giving out due to weakness over the past few days.  Assessment/Plan: Principal Problem:   Renal failure (ARF), acute on chronic- hyperkalema - Suspected to be due to Demadex and Zaroxolyn -FeNa is 0.8 confirming pre-renal cause-  hydrating with IV fluids-monitor I and O closely-renal ultrasound pending - Baseline creatinine is about 2 - K rising secondary to renal failure- f/u repeat Bmet later today - Monitor closely for development of pulmonary edema- fluids will expire around 7 AM tomorrow when I will reassess him  Active Problems:  Recent TURP - Appears to be voiding well but does have some hematuria which is not overly concerning -Eliquis has been on hold as outpt - Foley removed on Monday and then replaced in the ER -Dr. Janice Norrie will evaluate him later today    Paroxysmal atrial fibrillation, DCCV 06/16/13 -EKG reveals a paced rhythm -Continue amiodarone-Eliquis on hold due to acute renal failure    Elevated troponin I level - Chronically elevated troponins-no chest pain-we'll stop checking   Essential hypertension - Continue Lopressor and Imdur    Diabetes mellitus type 2, uncontrolled -Lantus recently decreased to 6 units at bedtime - sugar uncontrolled this AM- will increase to 8 U-continue low-dose sliding scale  Generalized weakness -We'll obtain a PT eval   Appt with PCP: Code Status: Full code  Family Communication: Wife at bedside  Disposition Plan: Follow renal function closely  DVT prophylaxis: Heparin  Consultants: Urology  Procedures:  Antibiotics: Anti-infectives    Start     Dose/Rate Route Frequency Ordered Stop   09/02/14 0400  cefTRIAXone (ROCEPHIN) 1 g in dextrose 5 % 50 mL IVPB     1 g 100 mL/hr over 30 Minutes Intravenous  Once 09/02/14 0359 09/02/14 0505      Objective: Filed Weights   09/02/14 0559  Weight: 82.6 kg (182 lb 1.6 oz)    Intake/Output Summary (Last 24 hours) at 09/02/14 1016 Last data filed at 09/02/14 0831  Gross per 24 hour  Intake      0 ml  Output    150 ml  Net   -150 ml     Vitals Filed Vitals:   09/02/14 0159 09/02/14 0205 09/02/14 0423 09/02/14 0559  BP:  133/65 137/82 127/63  Pulse:  63 72 63  Temp:  97.5 F (36.4 C)  97.6 F (36.4 C)  TempSrc:  Oral  Oral  Resp:  20 20 19   Height:    5\' 7"  (1.702 m)  Weight:    82.6 kg (182 lb 1.6 oz)  SpO2: 93% 93% 93% 98%    Exam:  General:  Pt is alert, not in acute distress  HEENT: No icterus, No thrush  Cardiovascular: regular rate and rhythm, S1/S2 No murmur  Respiratory: clear to auscultation bilaterally   Abdomen: Soft, +Bowel sounds, non tender, non distended, no guarding  MSK: No LE edema, cyanosis or clubbing  GU: Foley catheter draining blood-tinged urine  Data Reviewed: Basic Metabolic Panel:  Recent Labs Lab 08/28/14 0540 09/01/14 1605 09/02/14 0305  NA 134* 134* 133*  K 4.0 4.5 5.5*  CL 93* 95* 95*  CO2 32 35* 29  GLUCOSE 177* 59* 295*  BUN 53* 59* 67*  CREATININE 2.99* 3.85* 4.15*  CALCIUM 9.1 10.0 9.9   Liver Function  Tests: No results for input(s): AST, ALT, ALKPHOS, BILITOT, PROT, ALBUMIN in the last 168 hours. No results for input(s): LIPASE, AMYLASE in the last 168 hours. No results for input(s): AMMONIA in the last 168 hours. CBC:  Recent Labs Lab 08/28/14 0540 09/02/14 0305  WBC 6.7 6.9  NEUTROABS  --  6.2  HGB 11.7* 12.5*  HCT 36.1* 38.1*  MCV 100.3* 99.5  PLT 156 218   Cardiac Enzymes:  Recent Labs Lab 09/02/14 0305 09/02/14 0910  TROPONINI 0.21* 0.25*   BNP (last 3 results)  Recent Labs  06/22/14 1140 07/16/14 1431 07/23/14 1827  BNP 447.7* 506.6* 359.1*    ProBNP (last 3 results)  Recent Labs  12/23/13 0535 04/04/14 0742 07/16/14 1213  PROBNP 4564.0* 4874.0* 460.0*    CBG:  Recent Labs Lab 08/27/14 1636 08/27/14 2054 08/28/14 0704 08/28/14 1220 09/02/14 0732  GLUCAP 196* 216* 164* 143* 224*    No results found for this or any previous visit (from the past 240 hour(s)).   Studies:  Recent x-ray studies have been reviewed in detail by the Attending Physician  Scheduled Meds:  Scheduled Meds: . sodium chloride   Intravenous STAT  . allopurinol  100 mg Oral BID  . amiodarone  200 mg Oral QHS  . apixaban  2.5 mg Oral BID  . [START ON 09/03/2014] ferrous sulfate  325 mg Oral QODAY  . insulin aspart  0-9 Units Subcutaneous TID WC  . insulin glargine  6 Units Subcutaneous QHS  . isosorbide mononitrate  60 mg Oral Daily  . latanoprost  1 drop Both Eyes QHS  . metoprolol tartrate  25 mg Oral Daily   Continuous Infusions: . sodium chloride 125 mL/hr at 09/02/14 0825    Time spent on care of this patient: 36 min   [place], MD 09/02/2014, 10:16 AM  LOS: 0 days   [facility] Office  [phone] Pager - Text Page per [email or web address]  If 7PM-7AM, please contact night-coverage [email or web address]

## 2014-09-02 NOTE — H&P (Signed)
[facility] History and Physical  [name] VEL:[id number] DOB: 1918-07-11 DOA: 09/02/2014  Referring physician: Dr. Florina Ou. PCP: [name], MD  Specialists: Dr. Gerrit Friends.  Chief Complaint: Weakness.  HPI: [name] is a 81 y.o. male with history of CAD, chronic kidney disease, paroxysmal atrial fibrillation, sick sinus syndrome status post pacemaker placement presents to the ER because of increasing weakness. Patient had a TURP done last week and Foley catheter was removed this Monday 3 days ago. Following which patient states he has been feeling increasingly weak and finding it difficult to walk. Patient at times has jerking movements. Did not have any chest pain shortness of breath nausea vomiting diarrhea fever chills. No loss consciousness. Patient had some left hip pain and had gone to an orthopedic surgeon yesterday and had a steroid shot on his left hip for bursitis. And also followed up with his cardiologist yesterday and was found to be clinically dehydrated palpitations cardiologist [name] advised patient to be off his diuretics and had lab works done. Patient came to the ER because of increasing weakness and in the ER patient was found to have elevated creatinine from his baseline. On exam patient looks dry and dehydrated. Patient's blood sugar is elevated. Patient's caregiver states that patient's Lantus dose was decreased last few days because of decreasing blood sugar. Patient did receive steroid injection yesterday for his hip pain.   Review of Systems: As presented in the history of presenting illness, rest negative.  Past Medical History  Diagnosis Date  . Hyperlipidemia   . Spinal stenosis   . Anemia   . Carotid artery disease     a. s/p prior carotid endarterectomy.  [name] PVD (peripheral vascular disease)   . Hyponatremia     Chronic  . CVA (cerebral infarction)     a. When asked to clarify patient says he was told thumb numbness may be TIA. He  does not recall formal stroke. CT head results are only available through [facility], not in [facility].  . CKD (chronic kidney disease) stage 4, GFR 15-29 ml/min   . Long term (current) use of anticoagulants   . PAF (paroxysmal atrial fibrillation)     a. Asymptomatic. On Eliquis. CHADSVASC 7.  . Sick sinus syndrome     With DDD St. Jude PM, initially placed in 1993 by Dr. Nils Pyle. Device upgrade 10/2002 to DDD, by Dr. Rollene Fare complicated by bleeding. Subsequent gen change 2011.  [name] Hypertensive cardiovascular disease   . Chronic diastolic congestive heart failure     ECHO 05/20/12 LVEF estimated by 2D at 55-60%  . Arthritis   . Gait disorder   . Diabetic peripheral neuropathy associated with type 2 diabetes mellitus   . Diabetes mellitus     insulin dependent  . Elevated troponin I level 06/22/2014    a. 06/2014 felt due to demand ischemia.  . Chronic anticoagulation     For PAF, on Eliquis   . Urinary retention   . Pulmonary hypertension   . Foot drop, left 07/21/2014  . Asterixis 07/21/2014  . Hypertension   . Presence of permanent cardiac pacemaker   . Pneumonia     hx of pneumonia x 1    Past Surgical History  Procedure Laterality Date  . Lumbar laminectomy  1995  . Back surgery    . Total knee arthroplasty Left   . Quadriceps tendon repair    . Cataract extraction    . Carotid endarterectomy    .  Yag laser application Right 0/53/9767    Procedure: YAG LASER CAPSULOTOMY OF RIGHT EYE;  Surgeon: [name], MD;  Location: [place];  Service: Ophthalmology;  Laterality: Right;  . Tonsillectomy    . Pacemaker insertion      DDD St. Jude PM, initially placed in 1993 by Dr. Nils Pyle. Device upgrade 10/2002 to DDD, by Dr. Rollene Fare complicated by bleeding.  . Carotid endarterectomy Right 2006  . Transurethral resection of prostate    . Cardioversion N/A 06/16/2013    Procedure: CARDIOVERSION BEDSIDE;  Surgeon: [name], MD;  Location: [place];  Service: Cardiovascular;  Laterality:  N/A;  . Pacemaker generator change  12/09/2009    SJM Accent DR RF gen change by Dr [name]  . Insert / replace / remove pacemaker      2004   . Transurethral resection of prostate N/A 08/27/2014    Procedure: TRANSURETHRAL RESECTION OF THE PROSTATE WITH GYRUS INSTRUMENTS;  Surgeon: [name], MD;  Location: [facility];  Service: Urology;  Laterality: N/A;  . Cystoscopy N/A 08/27/2014    Procedure: CYSTOSCOPY;  Surgeon: [name], MD;  Location: [facility];  Service: Urology;  Laterality: N/A;   Social History:  reports that he quit smoking about 56 years ago. His smoking use included Pipe and Cigarettes. He quit after 2 years of use. He has never used smokeless tobacco. He reports that he does not drink alcohol or use illicit drugs. Where does patient live home. Can patient participate in ADLs? Yes.  Allergies  Allergen Reactions  . Statins Other (See Comments)    rhabdomyolisis  . Aggrenox [Aspirin-Dipyridamole Er] Other (See Comments)    Dizziness  . Carvedilol Other (See Comments)    Dizziness  . Claritin [Loratadine] Other (See Comments)    Dizziness & drowsiness   . Cymbalta [Duloxetine Hcl]     Confusion   . Mucinex [Guaifenesin Er] Other (See Comments)    Dizziness, drowsiness  . Plavix [Clopidogrel Bisulfate] Other (See Comments)    Dizziness  . Rapaflo [Silodosin] Other (See Comments)    Dizziness for about 30- 45  Minutes At time of preop appointment on 08/26/2014 patient denies any problems with this medication  . Travatan Z [Travoprost] Other (See Comments)    Eye irritation    Family History:  Family History  Problem Relation Age of Onset  . Multiple myeloma Father   . Hypertension Mother       Prior to Admission medications   Medication Sig Start Date End Date Taking? Authorizing Provider  allopurinol (ZYLOPRIM) 100 MG tablet Take 100 mg by mouth 2 (two) times daily.    Yes Historical Provider, MD  amiodarone (PACERONE) 200 MG tablet Take 1 tablet (200 mg total) by  mouth at bedtime. 01/01/14  Yes Ivan Anchors Love, PA-C  apixaban (ELIQUIS) 2.5 MG TABS tablet Take 2.5 mg by mouth 2 (two) times daily.   Yes Historical Provider, MD  ferrous sulfate 325 (65 FE) MG tablet Take 325 mg by mouth every other day.   Yes Historical Provider, MD  fexofenadine (ALLEGRA) 180 MG tablet Take 180 mg by mouth daily.   Yes Historical Provider, MD  insulin aspart (NOVOLOG FLEXPEN) 100 UNIT/ML FlexPen Inject 2-9 Units into the skin 3 (three) times daily with meals. Sliding scale If at lunch gbc is between 159 do not give any. If 299 or above give 3 units. At bedtime it is always 9 units   Yes Historical Provider, MD  insulin glargine (LANTUS) 100 UNIT/ML injection Inject 9-10 Units into the skin at bedtime. Sliding scale If at lunch gbc is between 159 do not give any. If 299 or above give 3 units. At bedtime it is always 9 unitsComment exceeds the 0 character limit   Yes Historical Provider, MD  isosorbide mononitrate (IMDUR) 60 MG 24 hr tablet Take 1 tablet (60 mg total) by mouth daily. 07/14/14  Yes Belva Crome, MD  metolazone (ZAROXOLYN) 2.5 MG tablet Take 2.5 mg by mouth every Monday, Wednesday, and Friday.   Yes Historical Provider, MD  metoprolol tartrate (LOPRESSOR) 25 MG tablet Take 1 tablet (25 mg total) by mouth daily. 06/21/14  Yes Belva Crome, MD  nitroGLYCERIN (NITROSTAT) 0.4 MG SL tablet Place 1 tablet (0.4 mg total) under the tongue every 5 (five) minutes as needed for chest pain. 12/03/13  Yes Belva Crome, MD  OVER THE COUNTER MEDICATION Take 1 packet by mouth daily.   Yes Historical Provider, MD  polyethylene glycol (MIRALAX / GLYCOLAX) packet Take 17 g by mouth 2 (two) times daily as needed for mild constipation. For constipation   Yes Historical Provider, MD  Polyvinyl Alcohol-Povidone (REFRESH OP) Place 1 drop into both eyes 2 (two) times daily. Refresh Gel Opth   Yes Historical Provider, MD  potassium chloride SA (K-DUR,KLOR-CON) 20 MEQ tablet Take 2 tablets (40 mEq  total) by mouth daily. Patient taking differently: Take 20 mEq by mouth daily.  07/19/14  Yes Debbe Odea, MD  Tafluprost (ZIOPTAN) 0.0015 % SOLN Place 1 drop into both eyes at bedtime. Use at night   Yes Historical Provider, MD  torsemide (DEMADEX) 20 MG tablet Take 4 tablets (80 mg total) by mouth daily. 07/03/14  Yes Eileen Stanford, PA-C    Physical Exam: Filed Vitals:   09/02/14 0159 09/02/14 0205 09/02/14 0423 09/02/14 0559  BP:  133/65 137/82 127/63  Pulse:  63 72 63  Temp:  97.5 F (36.4 C)  97.6 F (36.4 C)  TempSrc:  Oral  Oral  Resp:  '20 20 19  ' Height:    '5\' 7"'  (1.702 m)  Weight:    82.6 kg (182 lb 1.6 oz)  SpO2: 93% 93% 93% 98%     General:  Well-developed and nourished.  Eyes: Anicteric no pallor.  ENT: No discharge from the ears eyes nose and mouth.  Neck: No mass felt.  Cardiovascular: S1 and S2 heard.  Respiratory: No rhonchi or crepitations.  Abdomen: Soft nontender bowel sounds present.  Skin: No rash.  Musculoskeletal: No edema.  Psychiatric: Appears normal.  Neurologic: Alert awake oriented to time place and person. Moves all extremities.  Labs on Admission:  Basic Metabolic Panel:  Recent Labs Lab 08/28/14 0540 09/01/14 1605 09/02/14 0305  NA 134* 134* 133*  K 4.0 4.5 5.5*  CL 93* 95* 95*  CO2 32 35* 29  GLUCOSE 177* 59* 295*  BUN 53* 59* 67*  CREATININE 2.99* 3.85* 4.15*  CALCIUM 9.1 10.0 9.9   Liver Function Tests: No results for input(s): AST, ALT, ALKPHOS, BILITOT, PROT, ALBUMIN in the last 168 hours. No results for input(s): LIPASE, AMYLASE in the last 168 hours. No results for input(s): AMMONIA in the last 168 hours. CBC:  Recent Labs Lab 08/28/14 0540 09/02/14 0305  WBC 6.7 6.9  NEUTROABS  --  6.2  HGB 11.7* 12.5*  HCT 36.1* 38.1*  MCV 100.3* 99.5  PLT 156 218   Cardiac Enzymes:  Recent Labs Lab 09/02/14 0305  TROPONINI 0.21*    BNP (last 3 results)  Recent Labs  06/22/14 1140 07/16/14 1431  07/23/14 1827  BNP 447.7* 506.6* 359.1*    ProBNP (last 3 results)  Recent Labs  12/23/13 0535 04/04/14 0742 07/16/14 1213  PROBNP 4564.0* 4874.0* 460.0*    CBG:  Recent Labs Lab 08/27/14 1207 08/27/14 1636 08/27/14 2054 08/28/14 0704 08/28/14 1220  GLUCAP 200* 196* 216* 164* 143*    Radiological Exams on Admission: No results found.  EKG: Independently reviewed. Paced rhythm.  Assessment/Plan Active Problems:   Paroxysmal atrial fibrillation, DCCV 06/16/13   Pacemaker - St Jude   Essential hypertension   Renal failure (ARF), acute on chronic   Acute renal failure   Diabetes mellitus type 2, uncontrolled   Elevated troponin I level   1. Acute on chronic kidney disease stage IV - patient's creatinine are probably worsened secondary to obstructive component and also dehydration. At this time I'm holding all patient's diuretics and gently hydrating. Check urine sodium and creatinine and also renal sonogram. Closely follow intake output. I have ordered a repeat metabolic panel for 12 PM today. 2. Diabetes mellitus type 2 uncontrolled - patient received steroid shot on his left hip. Patient's Lantus dose also was decreased from 9 units to 6 years but patient's caregiver as patient's blood sugar was running low. At this time we will closely follow CBGs recent scale coverage and given patient's renal function worsening I have Patient's Lantus dose at 6 units at this time. 3. Elevated troponin - patient has chronically elevated troponin. Denies any chest pain. Cycle cardiac markers. Patient is on Apixaban. 4. Paroxysmal atrial fibrillation and history of sick sinus syndrome status post pacemaker placement - on metoprolol and Apixaban. Chads2 vasc score more than 7. 5. Hypertension - patient's blood pressure was recently running in the low normals. Closely observe. As patient is on diuretics. 6. Chronic CHF last EF measured in August 2015 was 45-50% - see #1. Patient's diuretics  on hold due to renal failure. 7. Chronic anemia - follow CBC.   DVT Prophylaxis Apixaban.  Code Status: Full code.  Family Communication: Discussed with patient.  Disposition Plan: Admit to inpatient. Likely stay would be 2-3 days.    KAKRAKANDY,ARSHAD N. [facility] Pager [phone].  If 7PM-7AM, please contact night-coverage [email or web address] Password [email or web address] 09/02/2014, 7:00 AM

## 2014-09-02 NOTE — ED Provider Notes (Signed)
CSN: [id number]     Arrival date & time 09/02/14  0159 History   First MD Initiated Contact with Patient 09/02/14 [phone]     Chief Complaint  Patient presents with  . Dysuria     (Consider location/radiation/quality/duration/timing/severity/associated sxs/prior Treatment) HPI  This is a 81 year old male who had a TURP on April 15. He was discharged with a Foley catheter that has subsequently been discontinued. He has subsequently had the sensation that he needs to void frequently but when he voids he voids only small amounts, typically less than 100 milliliters. He has also had hematuria which is improving. He had been on Eliquis but this was stopped recently pending resolution of his hematuria. He has also had pain with urination that is improving. He is here this morning because he awoke with a sensation of needing to void but unable to do so; he was also sweating and his caregiver became concerned that he had an infection. He has not had a fever or chills with this. He has had weakness of the knees for the past week and a half. This is worse with exertion and limits his ability to ambulate. He denies chest pain or shortness of breath. He did not take his Lasix yesterday out of concern for dehydration.  Past Medical History  Diagnosis Date  . Hyperlipidemia   . Spinal stenosis   . Anemia   . Carotid artery disease     a. s/p prior carotid endarterectomy.  [name] PVD (peripheral vascular disease)   . Hyponatremia     Chronic  . CVA (cerebral infarction)     a. When asked to clarify patient says he was told thumb numbness may be TIA. He does not recall formal stroke. CT head results are only available through [facility], not in [facility].  . CKD (chronic kidney disease) stage 4, GFR 15-29 ml/min   . Long term (current) use of anticoagulants   . PAF (paroxysmal atrial fibrillation)     a. Asymptomatic. On Eliquis. CHADSVASC 7.  . Sick sinus syndrome     With DDD St. Jude PM, initially placed in 1993 by Dr.  Nils Pyle. Device upgrade 10/2002 to DDD, by Dr. Rollene Fare complicated by bleeding. Subsequent gen change 2011.  [name] Hypertensive cardiovascular disease   . Chronic diastolic congestive heart failure     ECHO 05/20/12 LVEF estimated by 2D at 55-60%  . Arthritis   . Gait disorder   . Diabetic peripheral neuropathy associated with type 2 diabetes mellitus   . Diabetes mellitus     insulin dependent  . Elevated troponin I level 06/22/2014    a. 06/2014 felt due to demand ischemia.  . Chronic anticoagulation     For PAF, on Eliquis   . Urinary retention   . Pulmonary hypertension   . Foot drop, left 07/21/2014  . Asterixis 07/21/2014  . Hypertension   . Presence of permanent cardiac pacemaker   . Pneumonia     hx of pneumonia x 1    Past Surgical History  Procedure Laterality Date  . Lumbar laminectomy  1995  . Back surgery    . Total knee arthroplasty Left   . Quadriceps tendon repair    . Cataract extraction    . Carotid endarterectomy    . Yag laser application Right 6/44/0347    Procedure: YAG LASER CAPSULOTOMY OF RIGHT EYE;  Surgeon: [name], MD;  Location[facility] [place];  Service: Ophthalmology;  Laterality: Right;  . Tonsillectomy    .  Pacemaker insertion      DDD St. Jude PM, initially placed in 1993 by Dr. Nils Pyle. Device upgrade 10/2002 to DDD, by Dr. Rollene Fare complicated by bleeding.  . Carotid endarterectomy Right 2006  . Transurethral resection of prostate    . Cardioversion N/A 06/16/2013    Procedure: CARDIOVERSION BEDSIDE;  Surgeon: [name], MD;  Location: [place];  Service: Cardiovascular;  Laterality: N/A;  . Pacemaker generator change  12/09/2009    SJM Accent DR RF gen change by Dr [name]  . Insert / replace / remove pacemaker      2004   . Transurethral resection of prostate N/A 08/27/2014    Procedure: TRANSURETHRAL RESECTION OF THE PROSTATE WITH GYRUS INSTRUMENTS;  Surgeon: [name], MD;  Location: [facility];  Service: Urology;  Laterality: N/A;  .  Cystoscopy N/A 08/27/2014    Procedure: CYSTOSCOPY;  Surgeon: [name], MD;  Location: [facility];  Service: Urology;  Laterality: N/A;   Family History  Problem Relation Age of Onset  . Multiple myeloma Father   . Hypertension Mother    History  Substance Use Topics  . Smoking status: Former Smoker -- 2 years    Types: Pipe, Cigarettes    Quit date: 05/14/1958  . Smokeless tobacco: Never Used  . Alcohol Use: No     Comment: Cocktails occasionally    Review of Systems  All other systems reviewed and are negative.   Allergies  Statins; Aggrenox; Carvedilol; Claritin; Cymbalta; Mucinex; Plavix; Rapaflo; and Travatan z  Home Medications   Prior to Admission medications   Medication Sig Start Date End Date Taking? Authorizing Provider  allopurinol (ZYLOPRIM) 100 MG tablet Take 100 mg by mouth 2 (two) times daily.    Yes Historical Provider, MD  amiodarone (PACERONE) 200 MG tablet Take 1 tablet (200 mg total) by mouth at bedtime. 01/01/14  Yes Ivan Anchors Love, PA-C  apixaban (ELIQUIS) 2.5 MG TABS tablet Take 2.5 mg by mouth 2 (two) times daily.   Yes Historical Provider, MD  ferrous sulfate 325 (65 FE) MG tablet Take 325 mg by mouth every other day.   Yes Historical Provider, MD  fexofenadine (ALLEGRA) 180 MG tablet Take 180 mg by mouth daily.   Yes Historical Provider, MD  insulin aspart (NOVOLOG FLEXPEN) 100 UNIT/ML FlexPen Inject 2-9 Units into the skin 3 (three) times daily with meals. Sliding scale If at lunch gbc is between 159 do not give any. If 299 or above give 3 units. At bedtime it is always 9 units   Yes Historical Provider, MD  insulin glargine (LANTUS) 100 UNIT/ML injection Inject 9-10 Units into the skin at bedtime. Sliding scale If at lunch gbc is between 159 do not give any. If 299 or above give 3 units. At bedtime it is always 9 unitsComment exceeds the 0 character limit   Yes Historical Provider, MD  isosorbide mononitrate (IMDUR) 60 MG 24 hr tablet Take 1 tablet (60 mg  total) by mouth daily. 07/14/14  Yes Belva Crome, MD  metolazone (ZAROXOLYN) 2.5 MG tablet Take 2.5 mg by mouth every Monday, Wednesday, and Friday.   Yes Historical Provider, MD  metoprolol tartrate (LOPRESSOR) 25 MG tablet Take 1 tablet (25 mg total) by mouth daily. 06/21/14  Yes Belva Crome, MD  nitroGLYCERIN (NITROSTAT) 0.4 MG SL tablet Place 1 tablet (0.4 mg total) under the tongue every 5 (five) minutes as needed for chest pain. 12/03/13  Yes Belva Crome, MD  OVER  THE COUNTER MEDICATION Take 1 packet by mouth daily.   Yes Historical Provider, MD  polyethylene glycol (MIRALAX / GLYCOLAX) packet Take 17 g by mouth 2 (two) times daily as needed for mild constipation. For constipation   Yes Historical Provider, MD  Polyvinyl Alcohol-Povidone (REFRESH OP) Place 1 drop into both eyes 2 (two) times daily. Refresh Gel Opth   Yes Historical Provider, MD  potassium chloride SA (K-DUR,KLOR-CON) 20 MEQ tablet Take 2 tablets (40 mEq total) by mouth daily. Patient taking differently: Take 20 mEq by mouth daily.  07/19/14  Yes Debbe Odea, MD  Tafluprost (ZIOPTAN) 0.0015 % SOLN Place 1 drop into both eyes at bedtime. Use at night   Yes Historical Provider, MD  torsemide (DEMADEX) 20 MG tablet Take 4 tablets (80 mg total) by mouth daily. 07/03/14  Yes Eileen Stanford, PA-C   BP 133/65 mmHg  Pulse 63  Temp(Src) 97.5 F (36.4 C) (Oral)  Resp 20  SpO2 93%   Physical Exam  General: Well-developed, well-nourished male in no acute distress; appearance consistent with age of record HENT: normocephalic; atraumatic Eyes: pupils equal, round and reactive to light; extraocular muscles intact; arcus senilis bilaterally Neck: supple Heart: regular rate and rhythm; paced rhythm on monitor Lungs: clear to auscultation bilaterally; distant sounds Abdomen: soft; nondistended; nontender; no masses or hepatosplenomegaly; bowel sounds present Extremities: No deformity; full range of motion; pulses normal; +1 edema of  lower legs Neurologic: Awake, alert and oriented; motor function intact in all extremities and symmetric; no facial droop Skin: Warm and dry Psychiatric: Normal mood and affect    ED Course  Procedures (including critical care time)   EKG Interpretation   Date/Time:  Thursday September 02 2014 02:07:00 EDT Ventricular Rate:  60 PR Interval:  244 QRS Duration: 199 QT Interval:  516 QTC Calculation: 516 R Axis:   -173 Text Interpretation:  Atrial-paced rhythm Nonspecific intraventricular  conduction delay Intraventricular conduction delay more pronounced  Confirmed by Caniyah Murley  MD, Jenny Reichmann ([id number]) on 09/02/2014 2:10:52 AM      MDM   Nursing notes and vitals signs, including pulse oximetry, reviewed.  Summary of this visit's results, reviewed by myself:  Labs:  Results for orders placed or performed during the hospital encounter of 09/02/14 (from the past 24 hour(s))  Urinalysis, Routine w reflex microscopic     Status: Abnormal   Collection Time: 09/02/14  2:47 AM  Result Value Ref Range   Color, Urine YELLOW YELLOW   APPearance CLOUDY (A) CLEAR   Specific Gravity, Urine 1.017 1.005 - 1.030   pH 6.0 5.0 - 8.0   Glucose, UA NEGATIVE NEGATIVE mg/dL   Hgb urine dipstick LARGE (A) NEGATIVE   Bilirubin Urine NEGATIVE NEGATIVE   Ketones, ur NEGATIVE NEGATIVE mg/dL   Protein, ur 30 (A) NEGATIVE mg/dL   Urobilinogen, UA 1.0 0.0 - 1.0 mg/dL   Nitrite NEGATIVE NEGATIVE   Leukocytes, UA MODERATE (A) NEGATIVE  Urine microscopic-add on     Status: None   Collection Time: 09/02/14  2:47 AM  Result Value Ref Range   WBC, UA 11-20 <3 WBC/hpf   RBC / HPF TOO NUMEROUS TO COUNT <3 RBC/hpf   Bacteria, UA RARE RARE  Basic metabolic panel     Status: Abnormal   Collection Time: 09/02/14  3:05 AM  Result Value Ref Range   Sodium 133 (L) 135 - 145 mmol/L   Potassium 5.5 (H) 3.5 - 5.1 mmol/L   Chloride 95 (L) 96 - 112 mmol/L  CO2 29 19 - 32 mmol/L   Glucose, Bld 295 (H) 70 - 99 mg/dL   BUN  67 (H) 6 - 23 mg/dL   Creatinine, Ser 4.15 (H) 0.50 - 1.35 mg/dL   Calcium 9.9 8.4 - 10.5 mg/dL   GFR calc non Af Amer 11 (L) >90 mL/min   GFR calc Af Amer 13 (L) >90 mL/min   Anion gap 9 5 - 15  CBC with Differential/Platelet     Status: Abnormal   Collection Time: 09/02/14  3:05 AM  Result Value Ref Range   WBC 6.9 4.0 - 10.5 K/uL   RBC 3.83 (L) 4.22 - 5.81 MIL/uL   Hemoglobin 12.5 (L) 13.0 - 17.0 g/dL   HCT 38.1 (L) 39.0 - 52.0 %   MCV 99.5 78.0 - 100.0 fL   MCH 32.6 26.0 - 34.0 pg   MCHC 32.8 30.0 - 36.0 g/dL   RDW 16.0 (H) 11.5 - 15.5 %   Platelets 218 150 - 400 K/uL   Neutrophils Relative % 90 (H) 43 - 77 %   Neutro Abs 6.2 1.7 - 7.7 K/uL   Lymphocytes Relative 8 (L) 12 - 46 %   Lymphs Abs 0.6 (L) 0.7 - 4.0 K/uL   Monocytes Relative 2 (L) 3 - 12 %   Monocytes Absolute 0.2 0.1 - 1.0 K/uL   Eosinophils Relative 0 0 - 5 %   Eosinophils Absolute 0.0 0.0 - 0.7 K/uL   Basophils Relative 0 0 - 1 %   Basophils Absolute 0.0 0.0 - 0.1 K/uL  Troponin I     Status: Abnormal   Collection Time: 09/02/14  3:05 AM  Result Value Ref Range   Troponin I 0.21 (H) <0.031 ng/mL   3:58 AM Patient's renal function has been progressively worsening over the past several weeks. His creatinine is now over 4. His troponin is elevated although this may be a manifestation of his renal insufficiency. We will have him admitted. We will start Rocephin for possible postoperative urinary tract infection.     Shanon Rosser, MD 09/02/14 [phone]

## 2014-09-02 NOTE — Telephone Encounter (Signed)
-----   Message from Belva Crome, MD sent at 09/01/2014  6:08 PM EDT ----- Continue to hold Demadex and potassium. Try to wait daily and report to [name]. We will not resume Eliquis on the diuretic until his kidney function improves.

## 2014-09-02 NOTE — Progress Notes (Signed)
PT Cancellation Note  Patient Details Name: [name] MRN: [id number] DOB: March 06, 1919   Cancelled Treatment:    Reason Eval/Treat Not Completed: Medical issues which prohibited therapy. Spoke with RN who reports pt is confused after receiving medication. RN recommended we hold PT today and check back on tomorrow.    Weston Anna, MPT Pager: [phone]

## 2014-09-02 NOTE — Progress Notes (Signed)
Attempted to obtain orthostatic VS per MD order. Sitting and standing orthostatics were obtained and documented in flowsheets. They were stable. Pt was unable to stay standing, with 2 staff members holding him with the use of a walker. Pt became shaky and stated that he knees/legs get weak and shaky with standing. Pt states this started about 3 weeks ago. Pt also reported that he felt dizzy with standing, but unable to get a blood pressure reading while standing as pt could not remain standing long enough. Will continue to assist pt with activities and monitor for dizziness with standing. Fall precautions continued.   Othella Boyer [facility] 09/02/2014 9:36 AM

## 2014-09-02 NOTE — Progress Notes (Signed)
F/u appointment made and placed on AVS.  [name] [facility] 09/02/2014 11:35 AM

## 2014-09-02 NOTE — Progress Notes (Signed)
Pt's wife called RN to the room, stating that "he is different than he was earlier." Pt assessed and vital signs stable. Pt is able to answer orientation questions appropriately, though he is making inappropriate statements. Neuro assessment completed and all was normal other than the speech. No facial droop, pupils reactive, no decrease in motor function of extremities. Pt had been given a dose of phenergan IV about 40 minutes prior to confusion. It seems that pt's change in orientation could be related to the phenergan. MD made aware of changes in pt status. Phenergan discontinued and added to allergy list. Will continue to monitor pt for improvement in confusion. Fall/safety interventions in place.   Othella Boyer [facility] 09/02/2014 2:31 PM

## 2014-09-03 LAB — URINE CULTURE
CULTURE: NO GROWTH
Colony Count: NO GROWTH

## 2014-09-03 LAB — GLUCOSE, CAPILLARY
Glucose-Capillary: 190 mg/dL — ABNORMAL HIGH (ref 70–99)
Glucose-Capillary: 245 mg/dL — ABNORMAL HIGH (ref 70–99)
Glucose-Capillary: 208 mg/dL — ABNORMAL HIGH (ref 70–99)
Glucose-Capillary: 238 mg/dL — ABNORMAL HIGH (ref 70–99)

## 2014-09-03 LAB — BASIC METABOLIC PANEL
ANION GAP: 11 (ref 5–15)
BUN: 65 mg/dL — AB (ref 6–23)
CHLORIDE: 101 mmol/L (ref 96–112)
CO2: 26 mmol/L (ref 19–32)
CREATININE: 3.4 mg/dL — AB (ref 0.50–1.35)
Calcium: 9.4 mg/dL (ref 8.4–10.5)
GFR calc Af Amer: 16 mL/min — ABNORMAL LOW (ref >90)
GFR calc non Af Amer: 14 mL/min — ABNORMAL LOW (ref >90)
Glucose, Bld: 204 mg/dL — ABNORMAL HIGH (ref 70–99)
Potassium: 4.8 mmol/L (ref 3.5–5.1)
Sodium: 138 mmol/L (ref 135–145)

## 2014-09-03 MED ORDER — SODIUM CHLORIDE 0.9 % IV SOLN
INTRAVENOUS | Status: DC
  Administered 2014-09-03: 12:00:00 via INTRAVENOUS

## 2014-09-03 MED ORDER — INSULIN GLARGINE 100 UNIT/ML ~~LOC~~ SOLN
10.0000 [IU] | Freq: Every day | SUBCUTANEOUS | Status: DC
  Administered 2014-09-03 – 2014-09-07 (×5): 10 [IU] via SUBCUTANEOUS
  Filled 2014-09-03 (×6): qty 0.1

## 2014-09-03 MED ORDER — ENOXAPARIN SODIUM 80 MG/0.8ML ~~LOC~~ SOLN
80.0000 mg | SUBCUTANEOUS | Status: DC
  Administered 2014-09-03: 80 mg via SUBCUTANEOUS
  Filled 2014-09-03: qty 0.8

## 2014-09-03 MED ORDER — APIXABAN 2.5 MG PO TABS: 2.5000 mg | ORAL_TABLET | Freq: Two times a day (BID) | ORAL | Status: DC

## 2014-09-03 NOTE — Evaluation (Addendum)
Physical Therapy Evaluation Patient Details Name: [name] MRN: [id number] DOB: 1919/02/17 Today's Date: 09/03/2014   History of Present Illness  81 yo male with CHF (EF 55-60%), Dm2, Arthritis, CKD stage 4, spinal stenosis, CVA, L TKA, quad tendon repair admitted with acute on chronic renal failure. Per chart pt also fell at home.   Clinical Impression  Pt admitted with above diagnosis. Pt currently with functional limitations due to the deficits listed below (see PT Problem List). Pt ambulated 99' with RW and min assist, he had sudden, uncontrolled descent to recliner requiring mod assist for safety. If pt/family are agreeable, ST-SNF for rehab would be beneficial, otherwise HHPT recommended.  Pt was confused today so was unable to participate in goal setting/DC plan. Pt will benefit from skilled PT to increase their independence and safety with mobility to allow discharge to the venue listed below.       Follow Up Recommendations Supervision/Assistance - 24 hour;Home health PT;SNF (ST-SNF recommended for rehab, no family present to discuss DC plan)    Equipment Recommendations  Wheelchair (measurements PT)    Recommendations for Other Services       Precautions / Restrictions Precautions Precautions: Fall Restrictions Weight Bearing Restrictions: No      Mobility  Bed Mobility Overal bed mobility: Needs Assistance Bed Mobility: Rolling;Sidelying to Sit Rolling: Mod assist Sidelying to sit: Max assist       General bed mobility comments: max A to raise trunk  Transfers Overall transfer level: Needs assistance Equipment used: Rolling walker (2 wheeled) Transfers: Sit to/from Stand Sit to Stand: Min assist         General transfer comment: min A to rise  Ambulation/Gait Ambulation/Gait assistance: Min assist Ambulation Distance (Feet): 60 Feet Assistive device: Rolling walker (2 wheeled) Gait Pattern/deviations: Step-through pattern;Decreased stride length    Gait velocity interpretation: at or above normal speed for age/gender General Gait Details: distance limited by SOB/fatigue, pt had uncontrolled descent to recliner and attempted to sit prior to reaching recliner  SaO2 99% on RA with walking, 2/4 dyspnea with walking  Stairs            Wheelchair Mobility    Modified Rankin (Stroke Patients Only)       Balance Overall balance assessment: Needs assistance   Sitting balance-Leahy Scale: Good     Standing balance support: Bilateral upper extremity supported Standing balance-Leahy Scale: Poor                               Pertinent Vitals/Pain Pain Assessment: No/denies pain    Home Living Family/patient expects to be discharged to:: Private residence Living Arrangements: Spouse/significant other Available Help at Discharge: Personal care attendant;Available 24 hours/day;Family Type of Home: House Home Access: Stairs to enter Entrance Stairs-Rails: Right;Left;Can reach both Entrance Stairs-Number of Steps: 3 Home Layout: One level Home Equipment: Walker - 4 wheels;Tub bench;Hand held shower head;Walker - 2 wheels;Adaptive equipment      Prior Function Level of Independence: Needs assistance   Gait / Transfers Assistance Needed: Pt confused, no family present. Per PT eval 1 month ago pt had been independent with rollator until several weeks ago but since then, his knees have been buckling and has been experiencing muscle jerks and caregiver has had to help him to sit quickly  ADL's / Homemaking Assistance Needed: assisted for tub bench transfer, to wash feet and back, for LB dressing, meal prep and housekeeping. Pt  routinely sits to perform grooming on his rollator.  Comments: used to golf, plays computer games, uses computer     Hand Dominance   Dominant Hand: Right    Extremity/Trunk Assessment               Lower Extremity Assessment: RLE deficits/detail;LLE deficits/detail RLE Deficits /  Details: knee extension -4/5 LLE Deficits / Details: knee extension -3/5  Cervical / Trunk Assessment: Normal  Communication   Communication: HOH  Cognition Arousal/Alertness: Awake/alert Behavior During Therapy: WFL for tasks assessed/performed Overall Cognitive Status: Impaired/Different from baseline Area of Impairment: Orientation;Memory;Safety/judgement Orientation Level: Place;Time;Situation             General Comments: Per RN confusion may be related to medication.    General Comments      Exercises        Assessment/Plan    PT Assessment Patient needs continued PT services  PT Diagnosis Difficulty walking;Generalized weakness   PT Problem List Decreased strength;Decreased activity tolerance;Decreased balance;Decreased cognition;Decreased mobility;Cardiopulmonary status limiting activity  PT Treatment Interventions Gait training;Functional mobility training;Therapeutic activities;Patient/family education;Balance training;Therapeutic exercise   PT Goals (Current goals can be found in the Care Plan section) Acute Rehab PT Goals PT Goal Formulation: Patient unable to participate in goal setting Time For Goal Achievement: 09/17/14 Potential to Achieve Goals: Fair    Frequency Min 3X/week   Barriers to discharge        Co-evaluation               End of Session Equipment Utilized During Treatment: Gait belt Activity Tolerance: Patient limited by fatigue Patient left: in chair;with call bell/phone within reach;with nursing/sitter in room;with chair alarm set Nurse Communication: Mobility status         Time: 1347-1416 PT Time Calculation (min) (ACUTE ONLY): 29 min   Charges:   PT Evaluation $Initial PT Evaluation Tier I: 1 Procedure PT Treatments $Gait Training: 8-22 mins   PT G Codes:        Philomena Doheny 09/03/2014, 2:29 PM [phone]

## 2014-09-03 NOTE — Progress Notes (Signed)
Patient able to answer orientation questions but has continued with confusion overnight. He says things that done make any sense and has not slept.  Will continue to monitor patient for safety.

## 2014-09-03 NOTE — Progress Notes (Signed)
ANTICOAGULATION CONSULT NOTE - Follow Up Consult  Pharmacy Consult for Lovenox >> Eliquis Indication: atrial fibrillation  Allergies  Allergen Reactions  . Statins Other (See Comments)    rhabdomyolisis  . Aggrenox [Aspirin-Dipyridamole Er] Other (See Comments)    Dizziness  . Carvedilol Other (See Comments)    Dizziness  . Claritin [Loratadine] Other (See Comments)    Dizziness & drowsiness   . Cymbalta [Duloxetine Hcl]     Confusion   . Mucinex [Guaifenesin Er] Other (See Comments)    Dizziness, drowsiness  . Phenergan [Promethazine Hcl] Other (See Comments)    Causes confusion  . Plavix [Clopidogrel Bisulfate] Other (See Comments)    Dizziness  . Rapaflo [Silodosin] Other (See Comments)    Dizziness for about 30- 45  Minutes At time of preop appointment on 08/26/2014 patient denies any problems with this medication  . Travatan Z [Travoprost] Other (See Comments)    Eye irritation    Patient Measurements: Height: 5\' 7"  (170.2 cm) Weight: 187 lb (84.823 kg) IBW/kg (Calculated) : 66.1  Vital Signs: Temp: 97.4 F (36.3 C) (04/22 1416) Temp Source: Oral (04/22 1416) BP: 138/93 mmHg (04/22 1416) Pulse Rate: 65 (04/22 1416)  Labs:  Recent Labs  09/02/14 0305 09/02/14 0910 09/02/14 1500 09/03/14 0541  HGB 12.5*  --   --   --   HCT 38.1*  --   --   --   PLT 218  --   --   --   CREATININE 4.15*  --  3.71* 3.40*  TROPONINI 0.21* 0.25*  --   --     Estimated Creatinine Clearance: 13.8 mL/min (by C-G formula based on Cr of 3.4).   Medications:  Prescriptions prior to admission  Medication Sig Dispense Refill Last Dose  . allopurinol (ZYLOPRIM) 100 MG tablet Take 100 mg by mouth 2 (two) times daily.    09/01/2014 at Unknown time  . amiodarone (PACERONE) 200 MG tablet Take 1 tablet (200 mg total) by mouth at bedtime.   09/01/2014 at Unknown time  . apixaban (ELIQUIS) 2.5 MG TABS tablet Take 2.5 mg by mouth 2 (two) times daily.   09/01/2014 at Unknown time  . ferrous  sulfate 325 (65 FE) MG tablet Take 325 mg by mouth every other day.   09/01/2014 at Unknown time  . fexofenadine (ALLEGRA) 180 MG tablet Take 180 mg by mouth daily.   09/01/2014 at Unknown time  . insulin aspart (NOVOLOG FLEXPEN) 100 UNIT/ML FlexPen Inject 2-9 Units into the skin 3 (three) times daily with meals. Sliding scale If at lunch gbc is between 159 do not give any. If 299 or above give 3 units. At bedtime it is always 9 units   09/01/2014 at Unknown time  . insulin glargine (LANTUS) 100 UNIT/ML injection Inject 9-10 Units into the skin at bedtime. Sliding scale If at lunch gbc is between 159 do not give any. If 299 or above give 3 units. At bedtime it is always 9 unitsComment exceeds the 0 character limit   09/01/2014 at Unknown time  . isosorbide mononitrate (IMDUR) 60 MG 24 hr tablet Take 1 tablet (60 mg total) by mouth daily. 30 tablet 5 09/01/2014 at Unknown time  . metolazone (ZAROXOLYN) 2.5 MG tablet Take 2.5 mg by mouth every Monday, Wednesday, and Friday.   Taking  . metoprolol tartrate (LOPRESSOR) 25 MG tablet Take 1 tablet (25 mg total) by mouth daily. 30 tablet 6 09/01/2014 at 0930  . nitroGLYCERIN (NITROSTAT) 0.4 MG SL  tablet Place 1 tablet (0.4 mg total) under the tongue every 5 (five) minutes as needed for chest pain. 25 tablet 3 unknown at unknown  . OVER THE COUNTER MEDICATION Take 1 packet by mouth daily.   09/01/2014 at Unknown time  . polyethylene glycol (MIRALAX / GLYCOLAX) packet Take 17 g by mouth 2 (two) times daily as needed for mild constipation. For constipation   09/01/2014 at Unknown time  . Polyvinyl Alcohol-Povidone (REFRESH OP) Place 1 drop into both eyes 2 (two) times daily. Refresh Gel Opth   09/01/2014 at Unknown time  . potassium chloride SA (K-DUR,KLOR-CON) 20 MEQ tablet Take 2 tablets (40 mEq total) by mouth daily. (Patient taking differently: Take 20 mEq by mouth daily. ) 30 tablet 6 Past Week at Unknown time  . Tafluprost (ZIOPTAN) 0.0015 % SOLN Place 1 drop into  both eyes at bedtime. Use at night   09/01/2014 at Unknown time  . torsemide (DEMADEX) 20 MG tablet Take 4 tablets (80 mg total) by mouth daily. 120 tablet 3 Past Week at Unknown time   Scheduled:  . allopurinol  100 mg Oral BID  . amiodarone  200 mg Oral QHS  . enoxaparin (LOVENOX) injection  80 mg Subcutaneous Q24H  . ferrous sulfate  325 mg Oral QODAY  . insulin aspart  0-15 Units Subcutaneous TID WC  . insulin aspart  0-5 Units Subcutaneous QHS  . insulin glargine  10 Units Subcutaneous QHS  . isosorbide mononitrate  60 mg Oral Daily  . latanoprost  1 drop Both Eyes QHS  . metoprolol tartrate  25 mg Oral Daily    Assessment: 81 YOM admitted on 09/02/14 due to pacemaker transmission, dyuria, and hematuria after TURP on 08/27/14. Of note, the patient was discharged after surgery with an indwelling foley. Patient has AFib and was on Apixaban 2.5mg  twice daily PTA for stroke prevention. Patient experienced AoCKD on 09/02/14 and was transitioned to Lovenox per pharmacy.  Now MD would like to resume apixaban prior to discharge.  Today, 09/03/2014:  CBC: Hgb low but appears to be stable, PLTs WNL  Renal: SCr 3.40 mg/dL, CrCl 15.8 by   Wt > 60 kg; Age > 20 yrs  Some residual hematuria which is resolving and not overly concerning per MD  Home med, no drug-drug interactions noted  Last dose of lovenox 4/22 at 15:00  Goal of Therapy:  Prevention of thromboembolism   Plan:   Discontinue Lovenox  Begin apixaban 2.5 mg PO bid starting tomorrow, 4/23 at 8pm (20:00).  If patient is discharged on 4/23, should not resume apixaban until after 3pm  CBC, SCr at least q72 hr while on apixaban  Monitor for signs of bleeding, hematuria  Reuel Boom, PharmD Pager: [phone] 09/03/2014, 3:59 PM

## 2014-09-03 NOTE — Progress Notes (Signed)
ANTICOAGULATION CONSULT NOTE - Initial Consult  Pharmacy Consult for Lovenox Indication: atrial fibrillation  Allergies  Allergen Reactions  . Statins Other (See Comments)    rhabdomyolisis  . Aggrenox [Aspirin-Dipyridamole Er] Other (See Comments)    Dizziness  . Carvedilol Other (See Comments)    Dizziness  . Claritin [Loratadine] Other (See Comments)    Dizziness & drowsiness   . Cymbalta [Duloxetine Hcl]     Confusion   . Mucinex [Guaifenesin Er] Other (See Comments)    Dizziness, drowsiness  . Phenergan [Promethazine Hcl] Other (See Comments)    Causes confusion  . Plavix [Clopidogrel Bisulfate] Other (See Comments)    Dizziness  . Rapaflo [Silodosin] Other (See Comments)    Dizziness for about 30- 45  Minutes At time of preop appointment on 08/26/2014 patient denies any problems with this medication  . Travatan Z [Travoprost] Other (See Comments)    Eye irritation    Patient Measurements: Height: 5\' 7"  (170.2 cm) Weight: 187 lb (84.823 kg) IBW/kg (Calculated) : 66.1  Vital Signs: Temp: 97.6 F (36.4 C) (04/22 0634) Temp Source: Oral (04/22 0634) BP: 123/57 mmHg (04/22 0858) Pulse Rate: 91 (04/22 0858)  Labs:  Recent Labs  09/02/14 0305 09/02/14 0910 09/02/14 1500 09/03/14 0541  HGB 12.5*  --   --   --   HCT 38.1*  --   --   --   PLT 218  --   --   --   CREATININE 4.15*  --  3.71* 3.40*  TROPONINI 0.21* 0.25*  --   --     Estimated Creatinine Clearance: 13.8 mL/min (by C-G formula based on Cr of 3.4).   Medical History: Past Medical History  Diagnosis Date  . Hyperlipidemia   . Spinal stenosis   . Anemia   . Carotid artery disease     a. s/p prior carotid endarterectomy.  [name] PVD (peripheral vascular disease)   . Hyponatremia     Chronic  . CVA (cerebral infarction)     a. When asked to clarify patient says he was told thumb numbness may be TIA. He does not recall formal stroke. CT head results are only available through [facility], not in [facility].  .  CKD (chronic kidney disease) stage 4, GFR 15-29 ml/min   . Long term (current) use of anticoagulants   . PAF (paroxysmal atrial fibrillation)     a. Asymptomatic. On Eliquis. CHADSVASC 7.  . Sick sinus syndrome     With DDD St. Jude PM, initially placed in 1993 by Dr. Nils Pyle. Device upgrade 10/2002 to DDD, by Dr. Rollene Fare complicated by bleeding. Subsequent gen change 2011.  [name] Hypertensive cardiovascular disease   . Chronic diastolic congestive heart failure     ECHO 05/20/12 LVEF estimated by 2D at 55-60%  . Arthritis   . Gait disorder   . Diabetic peripheral neuropathy associated with type 2 diabetes mellitus   . Diabetes mellitus     insulin dependent  . Elevated troponin I level 06/22/2014    a. 06/2014 felt due to demand ischemia.  . Chronic anticoagulation     For PAF, on Eliquis   . Urinary retention   . Pulmonary hypertension   . Foot drop, left 07/21/2014  . Asterixis 07/21/2014  . Hypertension   . Presence of permanent cardiac pacemaker   . Pneumonia     hx of pneumonia x 1     Medications:  Scheduled:  . allopurinol  100 mg Oral BID  .  amiodarone  200 mg Oral QHS  . ferrous sulfate  325 mg Oral QODAY  . heparin subcutaneous  5,000 Units Subcutaneous 3 times per day  . insulin aspart  0-15 Units Subcutaneous TID WC  . insulin aspart  0-5 Units Subcutaneous QHS  . insulin glargine  10 Units Subcutaneous QHS  . isosorbide mononitrate  60 mg Oral Daily  . latanoprost  1 drop Both Eyes QHS  . metoprolol tartrate  25 mg Oral Daily   Infusions:  . sodium chloride     PRN: acetaminophen **OR** acetaminophen, nitroGLYCERIN, polyethylene glycol  Assessment: 89 YOM admitted on 09/02/14 due to pacemaker transmission, dyuria, and hematuria after TURP on 08/27/14. Of note, the patient was discharged after surgery with an indwelling foley. Patient has AFib and was on Apixaban 2.5mg  twice daily PTA for stroke prevention. Patient experienced AoCKD on 09/02/14 so the apixaban was held.  Patient was placed on SQ heparin for DVT prevention (last dose ~6AM today). Pharmacy has been consulted to dose Lovenox.   09/03/2014  CBC: H/H low, Hgb 12.5, PLTs WNL Renal: SCr 3.40 mg/dL, CrCl ~13 mL/min No bleeding noted  Goal of Therapy:  Anti-Xa level 0.6-1 units/ml 4hrs after LMWH dose given Monitor platelets by anticoagulation protocol: Yes   Plan:  -Start Lovenox 80mg  SQ q24h (~1mg /kg/day) -Monitor renal function, weight, and signs and symptoms of bleeding  Gloriajean Dell, PharmD Candidate

## 2014-09-03 NOTE — Progress Notes (Signed)
Inpatient Diabetes Program Recommendations  AACE/ADA: New Consensus Statement on Inpatient Glycemic Control (2013)  Target Ranges:  Prepandial:   less than 140 mg/dL      Peak postprandial:   less than 180 mg/dL (1-2 hours)      Critically ill patients:  140 - 180 mg/dL     Results for [name], [name] (MRN [id number]) as of 09/03/2014 09:16  Ref. Range 09/02/2014 07:32 09/02/2014 11:47 09/02/2014 17:09 09/02/2014 22:14  Glucose-Capillary Latest Ref Range: 70-99 mg/dL 224 (H) 302 (H) 270 (H) 302 (H)    Current DM Orders: Lantus 8 units QHS            Novolog Moderate SSI tid ac + HS     **Patient is eating 75% of meals.  **Having issues with glucose elevations.    MD- Please consider the following in-hospital insulin adjustments:  1. Increase Lantus to 9 units QHS  2. Add low dose Novolog Meal Coverage- Novolog 3 units tidwc      Will follow Wyn Quaker RN, MSN, CDE Diabetes Coordinator Inpatient Diabetes Program Team Pager: [phone] (8a-5p)

## 2014-09-03 NOTE — Consult Note (Signed)
  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 As an outpatient we have a dry weight in the 178-179 pound range.  Creatinine has improved from 4.15 down to 3.4. Baseline creatinine has run in the 2.4-2.7 range as an outpatient.  He is out of point now where aggressive IV fluid administration should be discontinued and some consideration given to reinstitution of diuretic therapy. He is at high risk for the sudden development of acute heart failure as he has done repeatedly in the past.

## 2014-09-03 NOTE — Consult Note (Addendum)
   [facility] CM Inpatient Consult   09/03/2014  KYLEN SCHLIEP [birth year] [id number]   Came to visit Dr. Kennon Holter to discuss and explain [facility] Care Management services. Spoke with his wife at bedside. She states she will think about it. Accepted [facility] Care Management brochure and contact information. Mrs Schools states she will call if interested. Will make inpatient RNCM aware.   Addendum: Spoke with inpatient RNCM who indicates woman at bedside is patient's sitter not wife. Made inpatient RNCM aware that sitter at bedside indicated she was the wife. Writer will come back at later time to discuss with Dr. Kennon Holter about [facility] services when he is more alert and oriented. Of note, Dr. Kennon Holter has pleasantly declined [facility] services in the past. However, based on number of admissions, [facility] Care Management continues to attempt to engage.  Marthenia Rolling, MSN-Ed, RN,BSN [facility] Liaison [phone]

## 2014-09-03 NOTE — Progress Notes (Signed)
[facility] Progress Note   [name] OVF:[id number] DOB: 1919/01/13 DOA: 09/02/2014 PCP: [name], MD  Brief narrative: [name] is a 81 y.o. male with past medical history of CAD, CKD, paroxysmal atrial fibrillation on anticoagulation, chronic diastolic heart failure presents to the hospital for generalized weakness. He had a TURP last week and Foley versus removed on Monday. Since then the patient states that he's had a very small amount of urine output with dark urine. He states that he's been feeling lightheaded when he gets up from bed in the morning. In the ER he is found to be dehydrated both clinically and based on lab work. He takes Zaroxolyn and torsemide at home which were held yesterday. He has also been having hematuria and apixaban has been on hold.   Subjective: Still confused after receiving phenergan yesterday.   Assessment/Plan: Principal Problem:   Renal failure (ARF), acute on chronic- hyperkalema - Suspected to be due to Demadex and Zaroxolyn which were held while he was re-hydrated- will stop IVF - I have spoken with Dr [name] who advises that he is at his baseline weight and is concerned that he will get fluid overloaded soon-  - continue to hold diuretics today and re-evaluate tomorrow -FeNa is 0.8 confirming pre-renal cause-  -renal ultrasound shows mild b/l renal atrophy - Baseline creatinine is about 2   Active Problems:  Recent TURP - Appears to be voiding well but does have some hematuria which is not overly concerning -Eliquis has been on hold as outpt - Foley removed on Monday and then replaced in the ER -Dr. Janice Norrie will evaluate him later today    Paroxysmal atrial fibrillation, DCCV 06/16/13 -EKG reveals a paced rhythm -Continue amiodarone-Eliquis on hold due to acute renal failure- will start Lovenox for now and further discuss anticoagulation options with cardiology  Chronic systolic CHF/ pulm HTN - lst Echo 8/15 revealed and  EF of 45-50 %, severe pulm HTN - currently holding Demadex due to renal failure-     Elevated troponin I level - Chronically elevated troponins-no chest pain-we'll stop checking    Essential hypertension - Continue Lopressor and Imdur    Diabetes mellitus type 2, uncontrolled -Lantus recently decreased to 6 units at bedtime - sugar still uncontrolled this AM- will increase to 10 U-continue sliding scale  Generalized weakness - PT eval- SNF recommended- will consult social work   Appt with PCP: Code Status: Full code  Family Communication: Wife at bedside  Disposition Plan: Follow renal function closely  DVT prophylaxis: Heparin  Consultants: Urology  Procedures:  Antibiotics: Anti-infectives    Start     Dose/Rate Route Frequency Ordered Stop   09/02/14 0400  cefTRIAXone (ROCEPHIN) 1 g in dextrose 5 % 50 mL IVPB     1 g 100 mL/hr over 30 Minutes Intravenous  Once 09/02/14 0359 09/02/14 0505      Objective: Filed Weights   09/02/14 0559 09/03/14 0634  Weight: 82.6 kg (182 lb 1.6 oz) 84.823 kg (187 lb)    Intake/Output Summary (Last 24 hours) at 09/03/14 0930 Last data filed at 09/03/14 0900  Gross per 24 hour  Intake 3462.92 ml  Output   1125 ml  Net 2337.92 ml     Vitals Filed Vitals:   09/02/14 1332 09/02/14 2215 09/03/14 0634 09/03/14 0858  BP: 130/62 135/63 122/69 123/57  Pulse: 62 60 62 91  Temp: 98.9 F (37.2 C) 98.2 F (36.8 C) 97.6 F (36.4 C)  TempSrc: Oral Oral Oral   Resp: '14 22 20   ' Height:      Weight:   84.823 kg (187 lb)   SpO2: 98% 93% 93%     Exam:  General:  Pt is alert but confused  HEENT: No icterus, No thrush  Cardiovascular: regular rate and rhythm, S1/S2 No murmur  Respiratory: clear to auscultation bilaterally   Abdomen: Soft, +Bowel sounds, non tender, non distended, no guarding  MSK: No LE edema, cyanosis or clubbing  GU: Foley catheter draining blood-tinged urine  Data Reviewed: Basic Metabolic Panel:  Recent  Labs Lab 08/28/14 0540 09/01/14 1605 09/02/14 0305 09/02/14 1500 09/03/14 0541  NA 134* 134* 133* 134* 138  K 4.0 4.5 5.5* 4.9 4.8  CL 93* 95* 95* 97 101  CO2 32 35* '29 27 26  ' GLUCOSE 177* 59* 295* 265* 204*  BUN 53* 59* 67* 64* 65*  CREATININE 2.99* 3.85* 4.15* 3.71* 3.40*  CALCIUM 9.1 10.0 9.9 9.2 9.4   Liver Function Tests: No results for input(s): AST, ALT, ALKPHOS, BILITOT, PROT, ALBUMIN in the last 168 hours. No results for input(s): LIPASE, AMYLASE in the last 168 hours. No results for input(s): AMMONIA in the last 168 hours. CBC:  Recent Labs Lab 08/28/14 0540 09/02/14 0305  WBC 6.7 6.9  NEUTROABS  --  6.2  HGB 11.7* 12.5*  HCT 36.1* 38.1*  MCV 100.3* 99.5  PLT 156 218   Cardiac Enzymes:  Recent Labs Lab 09/02/14 0305 09/02/14 0910  TROPONINI 0.21* 0.25*   BNP (last 3 results)  Recent Labs  06/22/14 1140 07/16/14 1431 07/23/14 1827  BNP 447.7* 506.6* 359.1*    ProBNP (last 3 results)  Recent Labs  12/23/13 0535 04/04/14 0742 07/16/14 1213  PROBNP 4564.0* 4874.0* 460.0*    CBG:  Recent Labs Lab 09/02/14 0732 09/02/14 1147 09/02/14 1709 09/02/14 2214 09/03/14 0829  GLUCAP 224* 302* 270* 302* 190*    No results found for this or any previous visit (from the past 240 hour(s)).   Studies:  Recent x-ray studies have been reviewed in detail by the Attending Physician  Scheduled Meds:  Scheduled Meds: . allopurinol  100 mg Oral BID  . amiodarone  200 mg Oral QHS  . ferrous sulfate  325 mg Oral QODAY  . heparin subcutaneous  5,000 Units Subcutaneous 3 times per day  . insulin aspart  0-15 Units Subcutaneous TID WC  . insulin aspart  0-5 Units Subcutaneous QHS  . insulin glargine  10 Units Subcutaneous QHS  . isosorbide mononitrate  60 mg Oral Daily  . latanoprost  1 drop Both Eyes QHS  . metoprolol tartrate  25 mg Oral Daily   Continuous Infusions:    Time spent on care of this patient: 35 min   [place],  MD 09/03/2014, 9:30 AM  LOS: 1 day   [facility] Office  [phone] Pager - Text Page per [email or web address]  If 7PM-7AM, please contact night-coverage [email or web address]

## 2014-09-04 ENCOUNTER — Inpatient Hospital Stay (HOSPITAL_COMMUNITY): Payer: Medicare Other

## 2014-09-04 DIAGNOSIS — N179 Acute kidney failure, unspecified: Secondary | ICD-10-CM

## 2014-09-04 DIAGNOSIS — G9340 Encephalopathy, unspecified: Secondary | ICD-10-CM

## 2014-09-04 DIAGNOSIS — I5033 Acute on chronic diastolic (congestive) heart failure: Secondary | ICD-10-CM

## 2014-09-04 DIAGNOSIS — R319 Hematuria, unspecified: Secondary | ICD-10-CM

## 2014-09-04 LAB — BASIC METABOLIC PANEL
Anion gap: 10 (ref 5–15)
BUN: 58 mg/dL — ABNORMAL HIGH (ref 6–23)
CHLORIDE: 103 mmol/L (ref 96–112)
CO2: 25 mmol/L (ref 19–32)
CREATININE: 2.88 mg/dL — AB (ref 0.50–1.35)
Calcium: 9.5 mg/dL (ref 8.4–10.5)
GFR calc Af Amer: 20 mL/min — ABNORMAL LOW (ref >90)
GFR calc non Af Amer: 17 mL/min — ABNORMAL LOW (ref >90)
Glucose, Bld: 174 mg/dL — ABNORMAL HIGH (ref 70–99)
Potassium: 4.3 mmol/L (ref 3.5–5.1)
Sodium: 138 mmol/L (ref 135–145)

## 2014-09-04 LAB — CBC
HCT: 35.4 % — ABNORMAL LOW (ref 39.0–52.0)
Hemoglobin: 11.6 g/dL — ABNORMAL LOW (ref 13.0–17.0)
MCH: 32.6 pg (ref 26.0–34.0)
MCHC: 32.8 g/dL (ref 30.0–36.0)
MCV: 99.4 fL (ref 78.0–100.0)
Platelets: 215 10*3/uL (ref 150–400)
RBC: 3.56 MIL/uL — ABNORMAL LOW (ref 4.22–5.81)
RDW: 16.7 % — ABNORMAL HIGH (ref 11.5–15.5)
WBC: 9 10*3/uL (ref 4.0–10.5)

## 2014-09-04 LAB — GLUCOSE, CAPILLARY
GLUCOSE-CAPILLARY: 209 mg/dL — AB (ref 70–99)
Glucose-Capillary: 154 mg/dL — ABNORMAL HIGH (ref 70–99)
Glucose-Capillary: 157 mg/dL — ABNORMAL HIGH (ref 70–99)
Glucose-Capillary: 174 mg/dL — ABNORMAL HIGH (ref 70–99)
Glucose-Capillary: 176 mg/dL — ABNORMAL HIGH (ref 70–99)

## 2014-09-04 LAB — LACTIC ACID, PLASMA
LACTIC ACID, VENOUS: 2.4 mmol/L — AB (ref 0.5–2.0)
LACTIC ACID, VENOUS: 1.5 mmol/L (ref 0.5–2.0)

## 2014-09-04 LAB — BRAIN NATRIURETIC PEPTIDE: B NATRIURETIC PEPTIDE 5: 1383.6 pg/mL — AB (ref 0.0–100.0)

## 2014-09-04 MED ORDER — ALBUTEROL SULFATE (2.5 MG/3ML) 0.083% IN NEBU
2.5000 mg | INHALATION_SOLUTION | Freq: Four times a day (QID) | RESPIRATORY_TRACT | Status: DC | PRN
  Administered 2014-09-04: 2.5 mg via RESPIRATORY_TRACT
  Filled 2014-09-04: qty 3

## 2014-09-04 MED ORDER — HEPARIN SODIUM (PORCINE) 5000 UNIT/ML IJ SOLN
5000.0000 [IU] | Freq: Three times a day (TID) | INTRAMUSCULAR | Status: DC
  Administered 2014-09-04 – 2014-09-06 (×5): 5000 [IU] via SUBCUTANEOUS
  Filled 2014-09-04 (×5): qty 1

## 2014-09-04 MED ORDER — TORSEMIDE 20 MG PO TABS
80.0000 mg | ORAL_TABLET | Freq: Every day | ORAL | Status: DC
Start: 2014-09-04 — End: 2014-09-04
  Filled 2014-09-04: qty 4

## 2014-09-04 MED ORDER — FUROSEMIDE 10 MG/ML IJ SOLN
100.0000 mg | Freq: Two times a day (BID) | INTRAVENOUS | Status: DC
  Administered 2014-09-04 – 2014-09-05 (×3): 100 mg via INTRAVENOUS
  Filled 2014-09-04 (×4): qty 10

## 2014-09-04 NOTE — Progress Notes (Signed)
PT Cancellation Note  Patient Details Name: ARGIL MAHL MRN: [id number] DOB: 04/16/1919   Cancelled Treatment:    Reason Eval/Treat Not Completed: Other--Attempted PT tx session at family's request. Pt awaiting portable chest x-ray and just beginning to settle down and fall asleep. Spoke with family memeber who thanked PT for coming by but now requests we check back another day. Family would like pt to rest/sleep because he hasn't slept in a few days. Will check back another time. Thanks.    Weston Anna, MPT Pager: [phone]

## 2014-09-04 NOTE — Progress Notes (Signed)
Patient continues to be confused; saying things that don't make sense, yelling out during the night, and reaching for things in the air.  He has continued with very red/ bloody urine output since foley cath was removed yesterday.  Sitter at bedside all night.  Will continue to monitor patient.

## 2014-09-04 NOTE — Consult Note (Signed)
Name: [name] is a 81 y.o. male Admit date: 09/02/2014 Referring Physician:  S. Wynelle Cleveland, MD Primary Physician:  Jeanann Lewandowsky, M.D. Primary Cardiologist:  [facility] Blenda Bridegroom, M.D.  Reason for Consultation:  Increased work of breathing  ASSESSMENT:  1. Acute on chronic diastolic heart failure. 2. Altered mental status/metabolic encephalopathy, likely related to "sundowning" and metabolic/hemodynamic derangements from kidney failure, heart failure, and advanced age. Has been noted to have asterixis even as an outpatient 3. History of paroxysmal atrial fibrillation 4. History of urinary tract obstruction secondary to benign prostatic hypertrophy. Recent TUR. 5. Diabetes mellitus, with complications 6. Hypertension, essential  PLAN:  1. IV Lasix. Will hold by mouth Demadex for the time being. 2. Avoid medications that can depress neurological function. 3. DVT prophylaxis 4. Guarded prognosis. Unfortunately he has never committed to DO NOT RESUSCITATE status.   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 Volume contraction occurred in setting of oral diuretic therapy but with decreased oral intake. IV hydration was started in the hospital and over the past 12 hours he is developed increased respiratory effort, wheezing, and rales on examination.[name] His weight is up from 176 pounds in the office on 09/01/14 (this was away done at home), to 182 pounds on admission, and now 189 pounds. He voices no complaints  PMH:   Past Medical History  Diagnosis Date  . Hyperlipidemia   . Spinal stenosis   . Anemia   . Carotid artery disease     a. s/p prior carotid endarterectomy.  [name] PVD (peripheral vascular disease)   . Hyponatremia     Chronic  . CVA (cerebral infarction)     a. When asked to clarify patient says he was told thumb numbness may be TIA. He does not recall formal stroke. CT head results are  only available through [facility], not in [facility].  . CKD (chronic kidney disease) stage 4, GFR 15-29 ml/min   . Long term (current) use of anticoagulants   . PAF (paroxysmal atrial fibrillation)     a. Asymptomatic. On Eliquis. CHADSVASC 7.  . Sick sinus syndrome     With DDD St. Jude PM, initially placed in 1993 by Dr. Nils Pyle. Device upgrade 10/2002 to DDD, by Dr. Rollene Fare complicated by bleeding. Subsequent gen change 2011.  [name] Hypertensive cardiovascular disease   . Chronic diastolic congestive heart failure     ECHO 05/20/12 LVEF estimated by 2D at 55-60%  . Arthritis   . Gait disorder   . Diabetic peripheral neuropathy associated with type 2 diabetes mellitus   . Diabetes mellitus     insulin dependent  . Elevated troponin I level 06/22/2014    a. 06/2014 felt due to demand ischemia.  . Chronic anticoagulation     For PAF, on Eliquis   . Urinary retention   . Pulmonary hypertension   . Foot drop, left 07/21/2014  . Asterixis 07/21/2014  . Hypertension   . Presence of permanent cardiac pacemaker   . Pneumonia     hx of pneumonia x 1     PSH:   Past Surgical History  Procedure Laterality Date  . Lumbar laminectomy  1995  . Back surgery    . Total knee arthroplasty Left   . Quadriceps tendon repair    . Cataract extraction    . Carotid endarterectomy    . Yag laser application Right 10/28/735    Procedure: YAG LASER CAPSULOTOMY  OF RIGHT EYE;  Surgeon: [name], MD;  Location: [facility];  Service: Ophthalmology;  Laterality: Right;  . Tonsillectomy    . Pacemaker insertion      DDD St. Jude PM, initially placed in 1993 by Dr. Nils Pyle. Device upgrade 10/2002 to DDD, by Dr. Rollene Fare complicated by bleeding.  . Carotid endarterectomy Right 2006  . Transurethral resection of prostate    . Cardioversion N/A 06/16/2013    Procedure: CARDIOVERSION BEDSIDE;  Surgeon: [name], MD;  Location: [facility];  Service: Cardiovascular;  Laterality: N/A;  . Pacemaker generator change   12/09/2009    SJM Accent DR RF gen change by Dr [name]  . Insert / replace / remove pacemaker      2004   . Transurethral resection of prostate N/A 08/27/2014    Procedure: TRANSURETHRAL RESECTION OF THE PROSTATE WITH GYRUS INSTRUMENTS;  Surgeon: [name], MD;  Location: [facility];  Service: Urology;  Laterality: N/A;  . Cystoscopy N/A 08/27/2014    Procedure: CYSTOSCOPY;  Surgeon: [name], MD;  Location: [facility];  Service: Urology;  Laterality: N/A;   Allergies:  Statins; Aggrenox; Carvedilol; Claritin; Cymbalta; Mucinex; Phenergan; Plavix; Rapaflo; and Travatan z Prior to Admit Meds:   Prescriptions prior to admission  Medication Sig Dispense Refill Last Dose  . allopurinol (ZYLOPRIM) 100 MG tablet Take 100 mg by mouth 2 (two) times daily.    09/01/2014 at Unknown time  . amiodarone (PACERONE) 200 MG tablet Take 1 tablet (200 mg total) by mouth at bedtime.   09/01/2014 at Unknown time  . apixaban (ELIQUIS) 2.5 MG TABS tablet Take 2.5 mg by mouth 2 (two) times daily.   09/01/2014 at Unknown time  . ferrous sulfate 325 (65 FE) MG tablet Take 325 mg by mouth every other day.   09/01/2014 at Unknown time  . fexofenadine (ALLEGRA) 180 MG tablet Take 180 mg by mouth daily.   09/01/2014 at Unknown time  . insulin aspart (NOVOLOG FLEXPEN) 100 UNIT/ML FlexPen Inject 2-9 Units into the skin 3 (three) times daily with meals. Sliding scale If at lunch gbc is between 159 do not give any. If 299 or above give 3 units. At bedtime it is always 9 units   09/01/2014 at Unknown time  . insulin glargine (LANTUS) 100 UNIT/ML injection Inject 9-10 Units into the skin at bedtime. Sliding scale If at lunch gbc is between 159 do not give any. If 299 or above give 3 units. At bedtime it is always 9 unitsComment exceeds the 0 character limit   09/01/2014 at Unknown time  . isosorbide mononitrate (IMDUR) 60 MG 24 hr tablet Take 1 tablet (60 mg total) by mouth daily. 30 tablet 5 09/01/2014 at Unknown time  . metolazone (ZAROXOLYN)  2.5 MG tablet Take 2.5 mg by mouth every Monday, Wednesday, and Friday.   Taking  . metoprolol tartrate (LOPRESSOR) 25 MG tablet Take 1 tablet (25 mg total) by mouth daily. 30 tablet 6 09/01/2014 at 0930  . nitroGLYCERIN (NITROSTAT) 0.4 MG SL tablet Place 1 tablet (0.4 mg total) under the tongue every 5 (five) minutes as needed for chest pain. 25 tablet 3 unknown at unknown  . OVER THE COUNTER MEDICATION Take 1 packet by mouth daily.   09/01/2014 at Unknown time  . polyethylene glycol (MIRALAX / GLYCOLAX) packet Take 17 g by mouth 2 (two) times daily as needed for mild constipation. For constipation   09/01/2014 at Unknown time  . Polyvinyl Alcohol-Povidone (REFRESH  OP) Place 1 drop into both eyes 2 (two) times daily. Refresh Gel Opth   09/01/2014 at Unknown time  . potassium chloride SA (K-DUR,KLOR-CON) 20 MEQ tablet Take 2 tablets (40 mEq total) by mouth daily. (Patient taking differently: Take 20 mEq by mouth daily. ) 30 tablet 6 Past Week at Unknown time  . Tafluprost (ZIOPTAN) 0.0015 % SOLN Place 1 drop into both eyes at bedtime. Use at night   09/01/2014 at Unknown time  . torsemide (DEMADEX) 20 MG tablet Take 4 tablets (80 mg total) by mouth daily. 120 tablet 3 Past Week at Unknown time   Fam HX:    Family History  Problem Relation Age of Onset  . Multiple myeloma Father   . Hypertension Mother    Social HX:    History   Social History  . Marital Status: Widowed    Spouse Name: N/A  . Number of Children: 7  . Years of Education: MD   Occupational History  . MD    Social History Main Topics  . Smoking status: Former Smoker -- 2 years    Types: Pipe, Cigarettes    Quit date: 05/14/1958  . Smokeless tobacco: Never Used  . Alcohol Use: No     Comment: Cocktails occasionally  . Drug Use: No  . Sexual Activity: Not Currently   Other Topics Concern  . Not on file   Social History Narrative   Has a caregiver that lives with him. Still works regularly as a Immunologist         Review of Systems: Confused. Unable to give reliable details.  Physical Exam: Blood pressure 150/71, pulse 61, temperature 97.6 F (36.4 C), temperature source Oral, resp. rate 20, height '5\' 7"'  (1.702 m), weight 189 lb (85.73 kg), SpO2 96 %. Weight change: 2 lb (0.907 kg)  Elderly and confused appearing. Increased respiratory effort. Some wheezing heard. Elevated neck veins Decreased breath sounds at both bases with rales in the mid lungs No edema Abdomen is soft Cardiac exam reveals no murmur. His mentioned above the neck veins are elevated. Neurological exam reveals confusion, hallucination, reaching for objects that are not present. Labs: Lab Results  Component Value Date   WBC 9.0 09/04/2014   HGB 11.6* 09/04/2014   HCT 35.4* 09/04/2014   MCV 99.4 09/04/2014   PLT 215 09/04/2014    Recent Labs Lab 09/04/14 0514  NA 138  K 4.3  CL 103  CO2 25  BUN 58*  CREATININE 2.88*  CALCIUM 9.5  GLUCOSE 174*   No results found for: PTT Lab Results  Component Value Date   INR 1.33 06/22/2014   INR 1.01 08/21/2012   INR 0.98 08/18/2012   Lab Results  Component Value Date   CKTOTAL 139 07/17/2014   CKMB 2.1 07/17/2014   TROPONINI 0.25* 09/02/2014     Radiology:  US Renal  09/02/2014   CLINICAL DATA:  Chronic kidney disease.  EXAM: RENAL/URINARY TRACT ULTRASOUND COMPLETE  COMPARISON:  None.  FINDINGS: Right Kidney:  Length: 9.8 cm. Echogenicity within normal limits. No mass or hydronephrosis visualized.  Left Kidney:  Length: 9.2 cm. 1.3 cm cyst is noted. Echogenicity within normal limits. No mass or hydronephrosis visualized.  Bladder:  Decompressed secondary to Foley catheter.  IMPRESSION: Mild bilateral renal atrophy is noted. No hydronephrosis or renal obstruction is noted.   Electronically Signed   By: Marijo Conception, M.D.   On: 09/02/2014 16:32   No chest X Ray on  this admission  EKG:  09/02/14, AV sequential pacing    [name] 09/04/2014 1:40  PM

## 2014-09-04 NOTE — Progress Notes (Signed)
CRITICAL VALUE ALERT   Critical value received:  Lactic Acid 2.4   Date of notification:  09/04/14  Time of notification:  9507  Critical value read back:Yes.    Nurse who received alert:  Sheffield Slider  MD notified (1st page):  Dr. Wynelle Cleveland  Time of first page:  1530  MD notified (2nd page): [facility]  Time of second page:  Responding MD:  [id number]  Time MD responded:  Samara Snide PA.

## 2014-09-04 NOTE — Progress Notes (Addendum)
[facility] Progress Note   [name] NTZ:[id number] DOB: [birth year] DOA: 09/02/2014 PCP: [name], MD  Brief narrative: [name] is a 81 y.o. male with past medical history of CAD, CKD, paroxysmal atrial fibrillation on anticoagulation, chronic diastolic heart failure presents to the hospital for generalized weakness. He had a TURP last week and Foley versus removed on Monday. Since then the patient states that he's had a very small amount of urine output with dark urine. He states that he's been feeling lightheaded when he gets up from bed in the morning. In the ER he is found to be dehydrated both clinically and based on lab work. He takes Zaroxolyn and torsemide at home which were held.. He has also been having hematuria after the TURP and apixaban has been on hold.   Subjective: Still confused after receiving phenergan 2 days ago  Assessment/Plan: Principal Problem:   Renal failure (ARF), acute on chronic- hyperkalema - Suspected to be due to [facility] and Zaroxolyn which were held while he was re-hydrated-  discontinued IVF on 4/22- I have spoken with Dr [name] who advises that he is at his baseline weight  -We'll resume Demadex today as creatinine has improved to baseline -FeNa is 0.8 confirming pre-renal cause-  -renal ultrasound shows mild b/l renal atrophy   Active Problems:  Acute encephalopathy - Secondary to Phenergan and slow to resolve-continue to follow-sitter at bedside  Recent TURP - Appears to be voiding well but did have some hematuria- as hematuria resolved, apixaban was resumed yesterday but according to the nurses he now has blood-tinged urine again- will hold it once again until hematuria clears completely    Paroxysmal atrial fibrillation, DCCV 06/16/13 -EKG reveals a paced rhythm -Continue amiodarone-Eliquis on hold due to ongoing hematuria  Chronic systolic CHF/ pulm HTN - last Echo 8/15 revealed and EF of 45-50 %, severe pulm HTN -  Have resumed Demadex- continue hold Zaroxolyn today as by mouth intake is poor due to confusion    Elevated troponin I level - Chronically elevated troponins-no chest pain-we'll stop checking    Essential hypertension - Continue Lopressor and Imdur    Diabetes mellitus type 2, uncontrolled -Continue Lantus at current dose-continue moderate dose sliding scale as by mouth intake is variable, do not want to increase insulin today  Generalized weakness - PT eval- SNF recommended- will consult social work   Appt with PCP: Code Status: Full code  Family Communication: Wife at bedside  Disposition Plan: Follow renal function closely  DVT prophylaxis: Heparin  Consultants:  Procedures:  Antibiotics: Anti-infectives    Start     Dose/Rate Route Frequency Ordered Stop   09/02/14 0400  cefTRIAXone (ROCEPHIN) 1 g in dextrose 5 % 50 mL IVPB     1 g 100 mL/hr over 30 Minutes Intravenous  Once 09/02/14 0359 09/02/14 0505      Objective: Filed Weights   09/02/14 0559 09/03/14 0634 09/04/14 0523  Weight: 82.6 kg (182 lb 1.6 oz) 84.823 kg (187 lb) 85.73 kg (189 lb)    Intake/Output Summary (Last 24 hours) at 09/04/14 1247 Last data filed at 09/04/14 1136  Gross per 24 hour  Intake    420 ml  Output    900 ml  Net   -480 ml     Vitals Filed Vitals:   09/03/14 1416 09/03/14 2108 09/04/14 0523 09/04/14 1050  BP: 138/93 154/77 148/69 150/71  Pulse: 65 59 59 61  Temp: 97.4 F (36.3 C) 97.7 F (  36.5 C) 97.6 F (36.4 C)   TempSrc: Oral Oral Oral   Resp: 18 20 20    Height:      Weight:   85.73 kg (189 lb)   SpO2: 94% 98% 96%     Exam:  General:  Pt is alert but confused  HEENT: No icterus, No thrush  Cardiovascular: regular rate and rhythm, S1/S2 No murmur  Respiratory: clear to auscultation bilaterally   Abdomen: Soft, +Bowel sounds, non tender, non distended, no guarding  MSK: No LE edema, cyanosis or clubbing  GU: Foley catheter draining blood-tinged urine  Data  Reviewed: Basic Metabolic Panel:  Recent Labs Lab 09/01/14 1605 09/02/14 0305 09/02/14 1500 09/03/14 0541 09/04/14 0514  NA 134* 133* 134* 138 138  K 4.5 5.5* 4.9 4.8 4.3  CL 95* 95* 97 101 103  CO2 35* 29 27 26 25   GLUCOSE 59* 295* 265* 204* 174*  BUN 59* 67* 64* 65* 58*  CREATININE 3.85* 4.15* 3.71* 3.40* 2.88*  CALCIUM 10.0 9.9 9.2 9.4 9.5   Liver Function Tests: No results for input(s): AST, ALT, ALKPHOS, BILITOT, PROT, ALBUMIN in the last 168 hours. No results for input(s): LIPASE, AMYLASE in the last 168 hours. No results for input(s): AMMONIA in the last 168 hours. CBC:  Recent Labs Lab 09/02/14 0305 09/04/14 0514  WBC 6.9 9.0  NEUTROABS 6.2  --   HGB 12.5* 11.6*  HCT 38.1* 35.4*  MCV 99.5 99.4  PLT 218 215   Cardiac Enzymes:  Recent Labs Lab 09/02/14 0305 09/02/14 0910  TROPONINI 0.21* 0.25*   BNP (last 3 results)  Recent Labs  06/22/14 1140 07/16/14 1431 07/23/14 1827  BNP 447.7* 506.6* 359.1*    ProBNP (last 3 results)  Recent Labs  12/23/13 0535 04/04/14 0742 07/16/14 1213  PROBNP 4564.0* 4874.0* 460.0*    CBG:  Recent Labs Lab 09/03/14 1216 09/03/14 1712 09/03/14 2108 09/04/14 0725 09/04/14 1150  GLUCAP 245* 208* 238* 174* 209*    Recent Results (from the past 240 hour(s))  Urine culture     Status: None   Collection Time: 09/02/14  4:02 AM  Result Value Ref Range Status   Specimen Description URINE, RANDOM  Final   Special Requests NONE  Final   Colony Count NO GROWTH Performed at [facility]   Final   Culture NO GROWTH Performed at [facility]   Final   Report Status 09/03/2014 FINAL  Final     Studies:  Recent x-ray studies have been reviewed in detail by the Attending Physician  Scheduled Meds:  Scheduled Meds: . allopurinol  100 mg Oral BID  . amiodarone  200 mg Oral QHS  . ferrous sulfate  325 mg Oral QODAY  . insulin aspart  0-15 Units Subcutaneous TID WC  . insulin aspart  0-5  Units Subcutaneous QHS  . insulin glargine  10 Units Subcutaneous QHS  . isosorbide mononitrate  60 mg Oral Daily  . latanoprost  1 drop Both Eyes QHS  . metoprolol tartrate  25 mg Oral Daily  . torsemide  80 mg Oral Daily   Continuous Infusions:    Time spent on care of this patient: 35 min   [place], MD 09/04/2014, 12:47 PM  LOS: 2 days   [facility] Office  [phone] Pager - Text Page per [email or web address]  If 7PM-7AM, please contact night-coverage [email or web address]

## 2014-09-05 DIAGNOSIS — I5033 Acute on chronic diastolic (congestive) heart failure: Secondary | ICD-10-CM

## 2014-09-05 LAB — MDC_IDC_ENUM_SESS_TYPE_REMOTE
Battery Remaining Longevity: 46 mo
Battery Voltage: 2.92 V
Brady Statistic AP VP Percent: 25 %
Brady Statistic AP VS Percent: 75 %
Brady Statistic RA Percent Paced: 99 %
Date Time Interrogation Session: 20160420070704
Implantable Pulse Generator Model: 2210
Implantable Pulse Generator Serial Number: 7151645
Lead Channel Impedance Value: 210 Ohm
Lead Channel Pacing Threshold Amplitude: 1 V
Lead Channel Pacing Threshold Amplitude: 1.25 V
Lead Channel Pacing Threshold Pulse Width: 0.6 ms
Lead Channel Setting Pacing Amplitude: 2.5 V
Lead Channel Setting Pacing Amplitude: 2.5 V
Lead Channel Setting Pacing Pulse Width: 0.6 ms
Lead Channel Setting Sensing Sensitivity: 2 mV
MDC IDC MSMT BATTERY REMAINING PERCENTAGE: 59 %
MDC IDC MSMT LEADCHNL RA PACING THRESHOLD PULSEWIDTH: 0.8 ms
MDC IDC MSMT LEADCHNL RA SENSING INTR AMPL: 1.4 mV
MDC IDC MSMT LEADCHNL RV IMPEDANCE VALUE: 490 Ohm
MDC IDC MSMT LEADCHNL RV SENSING INTR AMPL: 7.7 mV
MDC IDC STAT BRADY AS VP PERCENT: 1 %
MDC IDC STAT BRADY AS VS PERCENT: 1 %
MDC IDC STAT BRADY RV PERCENT PACED: 25 %

## 2014-09-05 LAB — GLUCOSE, CAPILLARY
Glucose-Capillary: 176 mg/dL — ABNORMAL HIGH (ref 70–99)
Glucose-Capillary: 214 mg/dL — ABNORMAL HIGH (ref 70–99)
Glucose-Capillary: 155 mg/dL — ABNORMAL HIGH (ref 70–99)
Glucose-Capillary: 175 mg/dL — ABNORMAL HIGH (ref 70–99)

## 2014-09-05 LAB — BASIC METABOLIC PANEL
ANION GAP: 9 (ref 5–15)
BUN: 54 mg/dL — ABNORMAL HIGH (ref 6–23)
CO2: 27 mmol/L (ref 19–32)
CREATININE: 2.69 mg/dL — AB (ref 0.50–1.35)
Calcium: 9.5 mg/dL (ref 8.4–10.5)
Chloride: 103 mmol/L (ref 96–112)
GFR calc Af Amer: 22 mL/min — ABNORMAL LOW (ref >90)
GFR calc non Af Amer: 19 mL/min — ABNORMAL LOW (ref >90)
Glucose, Bld: 132 mg/dL — ABNORMAL HIGH (ref 70–99)
Potassium: 4.2 mmol/L (ref 3.5–5.1)
Sodium: 139 mmol/L (ref 135–145)

## 2014-09-05 MED ORDER — METOLAZONE 2.5 MG PO TABS
2.5000 mg | ORAL_TABLET | Freq: Every day | ORAL | Status: AC
Start: 2014-09-05 — End: 2014-09-05
  Administered 2014-09-05: 2.5 mg via ORAL
  Filled 2014-09-05: qty 1

## 2014-09-05 NOTE — Progress Notes (Signed)
Patient seen. Resting comfortably at present. No chest pain or increased dyspnea. Chest mild rales and decrease breath sounds at bases. Neurologically he is calm, falls back asleep easily when not actively engaged in conversation. Plan: as per Dr. Thompson Caul note today.

## 2014-09-05 NOTE — Clinical Social Work Psychosocial (Signed)
Clinical Social Work Department BRIEF PSYCHOSOCIAL ASSESSMENT 09/05/2014  Patient:  [name], [name]     Account Number:  [id number]     [place] date:  09/02/2014  Clinical Social Worker:  Dede Query, CLINICAL SOCIAL WORKER  Date/Time:  09/05/2014 04:07 PM  Referred by:  Physician  Date Referred:  09/04/2014 Referred for  SNF Placement   Other Referral:   Interview type:  Family Other interview type:   Pt's son [name]    PSYCHOSOCIAL DATA Living Status:  FAMILY Admitted from facility:   Level of care:   Primary support name:  [name] Primary support relationship to patient:  CHILD, ADULT Degree of support available:   High. Pt lives with his son    CURRENT CONCERNS  Other Concerns:    SOCIAL WORK ASSESSMENT / PLAN CSW reviewed pt chart that reflected pt experiencing some confusion so pt's son was called to gather information. CSW explained role of CSW and prompted pt's son to discuss pt history and needs.  CSW provided explanation of SNF process and encouraged pt's son to discuss thoughts and feelings related to pt's illness and need for rehab.  CSW provided supportive listening.  CSW discussed process of sending pt information to SNF's and will send pt information today   Assessment/plan status:  Psychosocial Support/Ongoing Assessment of Needs Other assessment/ plan:   Information/referral to community resources:   Send pt information to SNF's in [place] area    PATIENT'S/FAMILY'S RESPONSE TO PLAN OF CARE: Pt's son stated that pt has been having some "hallucinations and confusion".  Pt's son stated that pt has been living with him and he has been caring for him. Pt's son stated that pt has no history of rehab but he would like pt to go to rehab when discharged because he just got a job and will not be as available as he has been in the past.  Pt's son appreciative of CSW help and hoping pt will get better and be home soon.    Dede Query, LCSW [place] Worker - Weekend Coverage cell #: [phone]

## 2014-09-05 NOTE — Progress Notes (Signed)
   BNP 1350; CXR with pulmonary edema; weight up to 190lbs, lactic acid 2.4  Renal function is better and near baseline.  Plan to continue diuresis to get near dry weight of low 180's lbs.

## 2014-09-05 NOTE — Progress Notes (Addendum)
[facility] Progress Note   [name] GYI:[id number] DOB: 20-Sep-1918 DOA: 09/02/2014 PCP: [name], MD  Brief narrative: [name] is a 81 y.o. male with past medical history of CAD, CKD, paroxysmal atrial fibrillation on anticoagulation, chronic diastolic heart failure presents to the hospital for generalized weakness. He had a TURP last week and Foley versus removed on Monday. Since then the patient states that he's had a very small amount of urine output with dark urine. He states that he's been feeling lightheaded when he gets up from bed in the morning. In the ER he is found to be dehydrated both clinically and based on lab work. He takes Zaroxolyn and torsemide at home which were held.. He has also been having hematuria after the TURP and apixaban has been on hold.   Subjective: Confusion has resolved. He has no complaints.   Assessment/Plan: Principal Problem:   Renal failure (ARF), acute on chronic- hyperkalema - Suspected to be due to [facility] and Zaroxolyn which were held while he was re-hydrated-  discontinued IVF on 4/22- I have spoken with Dr [name] who advises that he is at his baseline weight  -Resumed Lasix on 4/23 as creatinine had improved to baseline -FeNa is 0.8 confirming pre-renal cause-  -renal ultrasound shows mild b/l renal atrophy   Active Problems:  Acute on chronic diastolic CHF - unfortunately now fluid overloaded- Dr [name] has been assisting with management since 4/22 and is dosing diuretics  Acute encephalopathy - Secondary to Phenergan given on 4/21 for a complaint of nausea and slow to resolve -he appears to finally be at his baseline today  Recent TURP- ongoing hematuria - Appears to be voiding well but did have some hematuria on admission - as hematuria resolved, apixaban was resumed but hematuria recurred ansd was quite severe - continue to hold Apixaban until hematuria clears completely    Paroxysmal atrial fibrillation,  DCCV 06/16/13 -EKG reveals a paced rhythm -Continue amiodarone-Eliquis on hold due to ongoing hematuria  Chronic systolic CHF/ pulm HTN - last Echo 8/15 revealed and EF of 45-50 %, severe pulm HTN - Have resumed Demadex- continue hold Zaroxolyn today as by mouth intake is poor due to confusion    Elevated troponin I level - Chronically elevated troponins-no chest pain-we'll stop checking    Essential hypertension - Continue Lopressor and Imdur    Diabetes mellitus type 2, uncontrolled -Continue Lantus at current dose-continue moderate dose sliding scale - as PO intake is variable, do not want to increase insulin today  Generalized weakness - PT eval- SNF recommended-  Code Status: Full code  Family Communication:  Disposition Plan: Follow renal function closely - SNF when stable DVT prophylaxis: Heparin  Consultants:  Procedures:  Antibiotics: Anti-infectives    Start     Dose/Rate Route Frequency Ordered Stop   09/02/14 0400  cefTRIAXone (ROCEPHIN) 1 g in dextrose 5 % 50 mL IVPB     1 g 100 mL/hr over 30 Minutes Intravenous  Once 09/02/14 0359 09/02/14 0505      Objective: Filed Weights   09/03/14 0634 09/04/14 0523 09/05/14 0515  Weight: 84.823 kg (187 lb) 85.73 kg (189 lb) 86.41 kg (190 lb 8 oz)    Intake/Output Summary (Last 24 hours) at 09/05/14 1153 Last data filed at 09/05/14 0826  Gross per 24 hour  Intake    670 ml  Output   1405 ml  Net   -735 ml     Vitals Filed Vitals:  09/04/14 1343 09/04/14 2115 09/05/14 0515 09/05/14 1052  BP: 130/72 144/72 153/68 144/64  Pulse: 67 62 64 68  Temp: 98.8 F (37.1 C) 97.4 F (36.3 C) 98.2 F (36.8 C)   TempSrc: Oral Oral Oral   Resp: 20 18 18    Height:      Weight:   86.41 kg (190 lb 8 oz)   SpO2: 100% 100% 97%     Exam:  General:  Pt is alert - no distress  HEENT: No icterus, No thrush  Cardiovascular: regular rate and rhythm, S1/S2 No murmur  Respiratory: clear to auscultation bilaterally    Abdomen: Soft, +Bowel sounds, non tender, non distended, no guarding  MSK: No LE edema, cyanosis or clubbing  GU: condom catheter - urine in bag is blood-tinged urine  Data Reviewed: Basic Metabolic Panel:  Recent Labs Lab 09/02/14 0305 09/02/14 1500 09/03/14 0541 09/04/14 0514 09/05/14 0540  NA 133* 134* 138 138 139  K 5.5* 4.9 4.8 4.3 4.2  CL 95* 97 101 103 103  CO2 29 27 26 25 27   GLUCOSE 295* 265* 204* 174* 132*  BUN 67* 64* 65* 58* 54*  CREATININE 4.15* 3.71* 3.40* 2.88* 2.69*  CALCIUM 9.9 9.2 9.4 9.5 9.5   Liver Function Tests: No results for input(s): AST, ALT, ALKPHOS, BILITOT, PROT, ALBUMIN in the last 168 hours. No results for input(s): LIPASE, AMYLASE in the last 168 hours. No results for input(s): AMMONIA in the last 168 hours. CBC:  Recent Labs Lab 09/02/14 0305 09/04/14 0514  WBC 6.9 9.0  NEUTROABS 6.2  --   HGB 12.5* 11.6*  HCT 38.1* 35.4*  MCV 99.5 99.4  PLT 218 215   Cardiac Enzymes:  Recent Labs Lab 09/02/14 0305 09/02/14 0910  TROPONINI 0.21* 0.25*   BNP (last 3 results)  Recent Labs  07/16/14 1431 07/23/14 1827 09/04/14 1439  BNP 506.6* 359.1* 1383.6*    ProBNP (last 3 results)  Recent Labs  12/23/13 0535 04/04/14 0742 07/16/14 1213  PROBNP 4564.0* 4874.0* 460.0*    CBG:  Recent Labs Lab 09/04/14 1150 09/04/14 1658 09/04/14 2119 09/04/14 2355 09/05/14 0747  GLUCAP 209* 157* 176* 154* 176*    Recent Results (from the past 240 hour(s))  Urine culture     Status: None   Collection Time: 09/02/14  4:02 AM  Result Value Ref Range Status   Specimen Description URINE, RANDOM  Final   Special Requests NONE  Final   Colony Count NO GROWTH Performed at [facility]   Final   Culture NO GROWTH Performed at [facility]   Final   Report Status 09/03/2014 FINAL  Final     Studies:  Recent x-ray studies have been reviewed in detail by the Attending Physician  Scheduled Meds:  Scheduled  Meds: . allopurinol  100 mg Oral BID  . amiodarone  200 mg Oral QHS  . ferrous sulfate  325 mg Oral QODAY  . furosemide  100 mg Intravenous BID  . heparin subcutaneous  5,000 Units Subcutaneous 3 times per day  . insulin aspart  0-15 Units Subcutaneous TID WC  . insulin aspart  0-5 Units Subcutaneous QHS  . insulin glargine  10 Units Subcutaneous QHS  . isosorbide mononitrate  60 mg Oral Daily  . latanoprost  1 drop Both Eyes QHS  . metoprolol tartrate  25 mg Oral Daily   Continuous Infusions:    Time spent on care of this patient: 36 min   [place], MD 09/05/2014,  11:53 AM  LOS: 3 days   [facility] Office  [phone] Pager - Text Page per [email or web address]  If 7PM-7AM, please contact night-coverage [email or web address]

## 2014-09-06 DIAGNOSIS — Z950 Presence of cardiac pacemaker: Secondary | ICD-10-CM

## 2014-09-06 DIAGNOSIS — I509 Heart failure, unspecified: Secondary | ICD-10-CM | POA: Insufficient documentation

## 2014-09-06 DIAGNOSIS — I5023 Acute on chronic systolic (congestive) heart failure: Secondary | ICD-10-CM

## 2014-09-06 LAB — GLUCOSE, CAPILLARY
Glucose-Capillary: 88 mg/dL (ref 70–99)
Glucose-Capillary: 262 mg/dL — ABNORMAL HIGH (ref 70–99)
Glucose-Capillary: 162 mg/dL — ABNORMAL HIGH (ref 70–99)
Glucose-Capillary: 163 mg/dL — ABNORMAL HIGH (ref 70–99)

## 2014-09-06 LAB — BASIC METABOLIC PANEL
Anion gap: 5 (ref 5–15)
BUN: 49 mg/dL — ABNORMAL HIGH (ref 6–23)
CALCIUM: 9 mg/dL (ref 8.4–10.5)
CHLORIDE: 103 mmol/L (ref 96–112)
CO2: 30 mmol/L (ref 19–32)
CREATININE: 2.5 mg/dL — AB (ref 0.50–1.35)
GFR calc Af Amer: 24 mL/min — ABNORMAL LOW (ref >90)
GFR, EST NON AFRICAN AMERICAN: 21 mL/min — AB (ref >90)
Glucose, Bld: 100 mg/dL — ABNORMAL HIGH (ref 70–99)
Potassium: 3.4 mmol/L — ABNORMAL LOW (ref 3.5–5.1)
Sodium: 138 mmol/L (ref 135–145)

## 2014-09-06 MED ORDER — APIXABAN 2.5 MG PO TABS: 2.5000 mg | ORAL_TABLET | Freq: Two times a day (BID) | ORAL | Status: DC

## 2014-09-06 MED ORDER — APIXABAN 2.5 MG PO TABS
2.5000 mg | ORAL_TABLET | Freq: Two times a day (BID) | ORAL | Status: DC
  Administered 2014-09-06 – 2014-09-08 (×4): 2.5 mg via ORAL
  Filled 2014-09-06 (×4): qty 1

## 2014-09-06 MED ORDER — TORSEMIDE 20 MG PO TABS
80.0000 mg | ORAL_TABLET | Freq: Every day | ORAL | Status: DC
  Administered 2014-09-06 – 2014-09-08 (×3): 80 mg via ORAL
  Filled 2014-09-06 (×3): qty 4

## 2014-09-06 MED ORDER — POTASSIUM CHLORIDE CRYS ER 20 MEQ PO TBCR
40.0000 meq | EXTENDED_RELEASE_TABLET | ORAL | Status: AC
  Administered 2014-09-06 (×2): 40 meq via ORAL
  Filled 2014-09-06 (×2): qty 2

## 2014-09-06 NOTE — Progress Notes (Signed)
Will stop IV lasix and probably switch to PO demadex based on labs.

## 2014-09-06 NOTE — Progress Notes (Signed)
Patient Name: [name] Date of Encounter: 09/06/2014  Principal Problem:   Renal failure (ARF), acute on chronic Active Problems:   Paroxysmal atrial fibrillation, DCCV 06/16/13   Pacemaker - St Jude   Essential hypertension   Acute renal failure   Diabetes mellitus type 2, uncontrolled   Elevated troponin I level   Acute on chronic diastolic heart failure   Acute renal failure syndrome   Length of Stay: 4  SUBJECTIVE  The patient feels significantly better, the confusion has resolved, he is able to hold a nice conversation. He is only complaining that the physical therapy hasn't come to work with him yesterday and he feels weak.   CURRENT MEDS . allopurinol  100 mg Oral BID  . amiodarone  200 mg Oral QHS  . ferrous sulfate  325 mg Oral QODAY  . heparin subcutaneous  5,000 Units Subcutaneous 3 times per day  . insulin aspart  0-15 Units Subcutaneous TID WC  . insulin aspart  0-5 Units Subcutaneous QHS  . insulin glargine  10 Units Subcutaneous QHS  . isosorbide mononitrate  60 mg Oral Daily  . latanoprost  1 drop Both Eyes QHS  . metoprolol tartrate  25 mg Oral Daily     OBJECTIVE  Filed Vitals:   09/05/14 1500 09/05/14 2024 09/06/14 0421 09/06/14 0512  BP: 149/74 147/68 153/75   Pulse: 64 65 64   Temp: 98.1 F (36.7 C) 97.7 F (36.5 C) 98.3 F (36.8 C)   TempSrc: Oral Oral Oral   Resp: 18 18 18    Height:      Weight:    180 lb 12.8 oz (82.01 kg)  SpO2: 98% 100% 100%     Intake/Output Summary (Last 24 hours) at 09/06/14 0907 Last data filed at 09/06/14 6226  Gross per 24 hour  Intake    540 ml  Output   2400 ml  Net  -1860 ml   Filed Weights   09/04/14 0523 09/05/14 0515 09/06/14 0512  Weight: 189 lb (85.73 kg) 190 lb 8 oz (86.41 kg) 180 lb 12.8 oz (82.01 kg)    PHYSICAL EXAM  General: Pleasant, NAD. Neuro: Alert and oriented X 3. Moves all extremities spontaneously. Psych: Normal affect. HEENT:  Normal  Neck: Supple without bruits or  JVD. Lungs:  Resp regular and unlabored, CTA. Heart: RRR no s3, s4, or murmurs. Abdomen: Soft, non-tender, non-distended, BS + x 4.  Extremities: No clubbing, cyanosis or edema. DP/PT/Radials 2+ and equal bilaterally.  Accessory Clinical Findings  CBC  Recent Labs  09/04/14 0514  WBC 9.0  HGB 11.6*  HCT 35.4*  MCV 99.4  PLT 333   Basic Metabolic Panel  Recent Labs  09/05/14 0540 09/06/14 0807  NA 139 PENDING  K 4.2 PENDING  CL 103 PENDING  CO2 27 PENDING  GLUCOSE 132* 100*  BUN 54* 49*  CREATININE 2.69* 2.50*  CALCIUM 9.5 PENDING   Radiology/Studies  Dg Chest 1 View  09/04/2014   CLINICAL DATA:  Congestive heart failure, shortness of breath  EXAM: CHEST  1 VIEW  COMPARISON:  08/26/2014  FINDINGS: Left-sided dual lead pacer in place. Mild cardiomegaly noted with central vascular congestion. New hazy perihilar opacities are identified with trace pleural effusions. No acute osseous finding. Left glenohumeral degenerative change.  IMPRESSION: Cardiomegaly with probable mild alveolar pulmonary edema and trace effusions.    TELE: Atrial pacing   ASSESSMENT AND PLAN   1. Acute on chronic diastolic heart failure. 2. Altered  mental status/metabolic encephalopathy, likely related to "sundowning" and metabolic/hemodynamic derangements from kidney failure, heart failure, and advanced age. Has been noted to have asterixis even as an outpatient 3. History of paroxysmal atrial fibrillation 4. History of urinary tract obstruction secondary to benign prostatic hypertrophy. Recent TUR. 5. Diabetes mellitus, with complications 6. Hypertension, essential  PLAN:  The patient diuresed significant amount of fluid, today's weight is 180 lbs (previously 176 lbs), what should be his new baseline based on Dr [name] note - in order to prevent fluid contraction. I will discontinue iv Lasix and restart home dose of Demadex 80 mg po daily. I would hold his metolazone for now.  We will reorder  physical therapy, recheck the patient in the am, if he tolerated PO diuretics can go home.   Avoid medications that can depress neurological function. Crea is improving. Lytes are pending.   Signed, [name] MD, [facility] 09/06/2014

## 2014-09-06 NOTE — Progress Notes (Addendum)
[facility] Progress Note   ANOOP HEMMER DVV:[id number] DOB: [birth year] DOA: 09/02/2014 PCP: [name], MD  Brief narrative: [name] is a 81 y.o. male with past medical history of CAD, CKD, paroxysmal atrial fibrillation on anticoagulation, chronic diastolic heart failure presents to the hospital for generalized weakness. He had a TURP last week and Foley versus removed on Monday. Since then the patient states that he's had a very small amount of urine output with dark urine. He states that he's been feeling lightheaded when he gets up from bed in the morning. In the ER he is found to be dehydrated both clinically and based on lab work. He takes Zaroxolyn and torsemide at home which were held.. He has also been having hematuria after the TURP and apixaban has been on hold.   Subjective: Confusion has resolved. He has no complaints.   Assessment/Plan: Principal Problem:   Renal failure (ARF), acute on chronic- hyperkalema - Suspected to be due to [facility] and Zaroxolyn which were held while he was re-hydrated-  discontinued IVF on 4/22 after discussion with [name] -[name] resumed diuretics on 4/23 as creatinine had improved to baseline but despite this he became fluid overloaded by 4/24 -FeNa is 0.8 confirming pre-renal cause-  -renal ultrasound shows mild b/l renal atrophy  Active Problems:  Acute on chronic diastolic CHF - unfortunately now fluid overloaded- cardiology has been assisting with management since 4/22 and is dosing diuretics  Acute encephalopathy - Secondary to Phenergan given on 4/21 for a complaint of nausea and slow to resolve -Mental status returned to baseline on 4/24  Recent TURP- ongoing hematuria - Appears to be voiding well but did have some hematuria on admission - as hematuria resolved, apixaban was resumed but hematuria recurred and was quite severe - Per note from Dr. Janice Norrie today, okay to resume apixaban- Will  resume  Hypokalemia -Due to diuretics-replacing    Paroxysmal atrial fibrillation, DCCV 06/16/13 -EKG reveals a paced rhythm -Continue amiodarone- resume Eliquis  Chronic systolic CHF/ pulm HTN - last Echo 8/15 revealed and EF of 45-50 %, severe pulm HTN - Cardiology managing diuretics    Elevated troponin I level- - Chronically elevated troponins-no chest pain-we'll stop checking    Essential hypertension - Continue Lopressor and Imdur    Diabetes mellitus type 2, uncontrolled -Continue Lantus at current dose-continue moderate dose sliding scale  Generalized weakness - PT eval- SNF recommended but patient hesitant to go-follow-up how he does with physical therapy today  Code Status: Full code  Family Communication: Discussed with daughter Disposition Plan: Home with home health versus SNF when stable DVT prophylaxis: Heparin - we will be transitioning to apixaban today Consultants: Cardiology, urology Procedures:  Antibiotics: Anti-infectives    Start     Dose/Rate Route Frequency Ordered Stop   09/02/14 0400  cefTRIAXone (ROCEPHIN) 1 g in dextrose 5 % 50 mL IVPB     1 g 100 mL/hr over 30 Minutes Intravenous  Once 09/02/14 0359 09/02/14 0505      Objective: Filed Weights   09/04/14 0523 09/05/14 0515 09/06/14 0512  Weight: 85.73 kg (189 lb) 86.41 kg (190 lb 8 oz) 82.01 kg (180 lb 12.8 oz)    Intake/Output Summary (Last 24 hours) at 09/06/14 1350 Last data filed at 09/06/14 0929  Gross per 24 hour  Intake    540 ml  Output   2400 ml  Net  -1860 ml     Vitals Filed Vitals:   09/05/14 2024  09/06/14 0421 09/06/14 0512 09/06/14 1107  BP: 147/68 153/75  147/78  Pulse: 65 64    Temp: 97.7 F (36.5 C) 98.3 F (36.8 C)    TempSrc: Oral Oral    Resp: 18 18    Height:      Weight:   82.01 kg (180 lb 12.8 oz)   SpO2: 100% 100%      Exam:  General:  Pt is alert - no distress  HEENT: No icterus, No thrush  Cardiovascular: regular rate and rhythm, S1/S2 No  murmur  Respiratory: Clear to auscultation bilaterally  Abdomen: Soft, +Bowel sounds, non tender, non distended, no guarding  MSK: No LE edema, cyanosis or clubbing  GU: condom catheter - urine in bag is dark brown and no longer blood-tinged  Data Reviewed: Basic Metabolic Panel:  Recent Labs Lab 09/02/14 1500 09/03/14 0541 09/04/14 0514 09/05/14 0540 09/06/14 0807  NA 134* 138 138 139 138  K 4.9 4.8 4.3 4.2 3.4*  CL 97 101 103 103 103  CO2 27 26 25 27 30   GLUCOSE 265* 204* 174* 132* 100*  BUN 64* 65* 58* 54* 49*  CREATININE 3.71* 3.40* 2.88* 2.69* 2.50*  CALCIUM 9.2 9.4 9.5 9.5 9.0   Liver Function Tests: No results for input(s): AST, ALT, ALKPHOS, BILITOT, PROT, ALBUMIN in the last 168 hours. No results for input(s): LIPASE, AMYLASE in the last 168 hours. No results for input(s): AMMONIA in the last 168 hours. CBC:  Recent Labs Lab 09/02/14 0305 09/04/14 0514  WBC 6.9 9.0  NEUTROABS 6.2  --   HGB 12.5* 11.6*  HCT 38.1* 35.4*  MCV 99.5 99.4  PLT 218 215   Cardiac Enzymes:  Recent Labs Lab 09/02/14 0305 09/02/14 0910  TROPONINI 0.21* 0.25*   BNP (last 3 results)  Recent Labs  07/16/14 1431 07/23/14 1827 09/04/14 1439  BNP 506.6* 359.1* 1383.6*    ProBNP (last 3 results)  Recent Labs  12/23/13 0535 04/04/14 0742 07/16/14 1213  PROBNP 4564.0* 4874.0* 460.0*    CBG:  Recent Labs Lab 09/05/14 1140 09/05/14 1704 09/05/14 2135 09/06/14 0755 09/06/14 1204  GLUCAP 214* 155* 175* 88 262*    Recent Results (from the past 240 hour(s))  Urine culture     Status: None   Collection Time: 09/02/14  4:02 AM  Result Value Ref Range Status   Specimen Description URINE, RANDOM  Final   Special Requests NONE  Final   Colony Count NO GROWTH Performed at [facility]   Final   Culture NO GROWTH Performed at [facility]   Final   Report Status 09/03/2014 FINAL  Final     Studies:  Recent x-ray studies have been reviewed  in detail by the Attending Physician  Scheduled Meds:  Scheduled Meds: . allopurinol  100 mg Oral BID  . amiodarone  200 mg Oral QHS  . ferrous sulfate  325 mg Oral QODAY  . heparin subcutaneous  5,000 Units Subcutaneous 3 times per day  . insulin aspart  0-15 Units Subcutaneous TID WC  . insulin aspart  0-5 Units Subcutaneous QHS  . insulin glargine  10 Units Subcutaneous QHS  . isosorbide mononitrate  60 mg Oral Daily  . latanoprost  1 drop Both Eyes QHS  . metoprolol tartrate  25 mg Oral Daily  . torsemide  80 mg Oral Daily   Continuous Infusions:    Time spent on care of this patient: 35 min   [place], MD 09/06/2014, 1:50 PM  LOS: 4 days   [facility] Office  [phone] Pager - Text Page per [email or web address]  If 7PM-7AM, please contact night-coverage [email or web address]

## 2014-09-06 NOTE — Progress Notes (Signed)
Physical Therapy Treatment Patient Details Name: [name] MRN: [id number] DOB: 03/23/1919 Today's Date: 09/06/2014    History of Present Illness 81 yo male with CHF (EF 55-60%), Dm2, Arthritis, CKD stage 4, spinal stenosis, CVA, L TKA, quad tendon repair admitted with acute on chronic renal failure. Per chart pt also fell at home.     PT Comments    [name] is AxO x 3 with slight groggyness/delay.  Assisted OOB to amb required + 2 assist for safety.  Very unsteady gait with tremors throughout which inhibited further mobility.  HIGH FALL RISK.  Caregiver was present and assisted by applying B shoes (L AFO).  [name] also like to wear his back corset.     Follow Up Recommendations  Supervision/Assistance - 24 hour;Home health PT;SNF (pending progress )     Equipment Recommendations       Recommendations for Other Services       Precautions / Restrictions Precautions Precautions: Fall Precaution Comments: wears B shoes (L AFO)  and back corsett Restrictions Weight Bearing Restrictions: No    Mobility  Bed Mobility Overal bed mobility: Needs Assistance Bed Mobility: Supine to Sit   Sidelying to sit: Max assist       General bed mobility comments: max A to raise trunk and use of bed pad to scoot hips to EOB   Transfers Overall transfer level: Needs assistance Equipment used: Rolling walker (2 wheeled) Transfers: Sit to/from Stand Sit to Stand: Min assist;Mod assist         General transfer comment: 25% VC's on proper hand placement  Ambulation/Gait Ambulation/Gait assistance: +2 physical assistance;+2 safety/equipment;Mod assist Ambulation Distance (Feet): 24 Feet Assistive device: Rolling walker (2 wheeled) Gait Pattern/deviations: Step-to pattern;Step-through pattern;Trunk flexed;Narrow base of support;Shuffle Gait velocity: decreased   General Gait Details: very weak/unsteady gait.  Required + 2 assist for safety.  Limited activity tolerance and MAX  c/o weakness/fatigue.    Stairs            Wheelchair Mobility    Modified Rankin (Stroke Patients Only)       Balance                                    Cognition Arousal/Alertness: Awake/alert Behavior During Therapy: WFL for tasks assessed/performed Overall Cognitive Status: Impaired/Different from baseline Area of Impairment: Attention;Safety/judgement (slow/delayed level of alertness)               General Comments: Per RN confusion may be related to medication.    Exercises      General Comments        Pertinent Vitals/Pain Pain Assessment: No/denies pain    Home Living                      Prior Function            PT Goals (current goals can now be found in the care plan section)      Frequency  Min 3X/week    PT Plan      Co-evaluation             End of Session Equipment Utilized During Treatment: Gait belt Activity Tolerance: Patient limited by fatigue Patient left: in chair;with call bell/phone within reach;with nursing/sitter in room;with chair alarm set     Time: 1025-1100 PT Time Calculation (min) (ACUTE ONLY): 35 min  Charges:  $Gait  Training: 8-22 mins $Therapeutic Activity: 8-22 mins                    G Codes:      Rica Koyanagi  PTA [facility]  Acute  Rehab Pager      [phone]

## 2014-09-06 NOTE — Clinical Documentation Improvement (Signed)
Abnormal Lab and/or Testing Results:  Troponin I: 4/21:  0.25 4/21:  0.21   His troponin is elevated although this may be a manifestation of his renal insufficiency per 4/21 progress notes.    Possible Clinical Conditions: >  NSTEMI >  STEMI >  Demand Ischemia >  Other >  Not able to determine    Thank Sherian Maroon Documentation Specialist [phone] [email or web address]

## 2014-09-06 NOTE — Progress Notes (Signed)
Patient ID: [name], male   DOB: [birth year], 81 y.o.   MRN: [id number]  Urine clearing up. OK with me to restart Eliquis

## 2014-09-06 NOTE — Clinical Social Work Placement (Signed)
CSW spoke with patient re: discharge planning. Weekend CSW, Rollene Fare spoke with patient's son, [name] re: SNF. Patient though was hesitant to agree, asked about [facility]. CSW text paged Dr. Wynelle Cleveland for CIR consult if appropriate. CSW left message for patient's son as well, will await call back.  CSW reviewed PT note from today recommending supervision/assistance 24 hour;home health vs. SNF. Patient did state that he has a 24 hour caregiver, [name].   CSW will follow-up tomorrow re: discharge plan.    Raynaldo Opitz, [facility] Clinical Social Worker cell #: [phone]   CLINICAL SOCIAL WORK PLACEMENT  NOTE  Date:  09/06/2014  Patient Details  Name: [name] MRN: [id number] Date of Birth: 1918-11-04  Clinical Social Work is seeking post-discharge placement for this patient at the [place] level of care (*CSW will initial, date and re-position this form in  chart as items are completed):  Yes   Patient/family provided with [place] Work Department's list of facilities offering this level of care within the geographic area requested by the patient (or if unable, by the patient's family).  Yes   Patient/family informed of their freedom to choose among providers that offer the needed level of care, that participate in Medicare, Medicaid or managed care program needed by the patient, have an available bed and are willing to accept the patient.  Yes   Patient/family informed of [place]'s ownership interest in [facility] and [facility], as well as of the fact that they are under no obligation to receive care at these facilities.  PASRR submitted to EDS on 09/06/14     PASRR number received on 09/06/14     Existing PASRR number confirmed on       FL2 transmitted to all facilities in geographic area requested by pt/family on 09/06/14     FL2 transmitted to all facilities within larger geographic  area on       Patient informed that his/her managed care company has contracts with or will negotiate with certain facilities, including the following:        Yes   Patient/family informed of bed offers received.  Patient chooses bed at       Physician recommends and patient chooses bed at      Patient to be transferred to   on  .  Patient to be transferred to facility by       Patient family notified on   of transfer.  Name of family member notified:        PHYSICIAN       Additional Comment:    _______________________________________________ Standley Brooking, LCSW 09/06/2014, 4:36 PM

## 2014-09-07 LAB — BASIC METABOLIC PANEL
Anion gap: 10 (ref 5–15)
BUN: 47 mg/dL — AB (ref 6–23)
CALCIUM: 9.4 mg/dL (ref 8.4–10.5)
CHLORIDE: 100 mmol/L (ref 96–112)
CO2: 28 mmol/L (ref 19–32)
CREATININE: 2.33 mg/dL — AB (ref 0.50–1.35)
GFR calc Af Amer: 26 mL/min — ABNORMAL LOW (ref >90)
GFR calc non Af Amer: 22 mL/min — ABNORMAL LOW (ref >90)
GLUCOSE: 173 mg/dL — AB (ref 70–99)
Potassium: 4.5 mmol/L (ref 3.5–5.1)
Sodium: 138 mmol/L (ref 135–145)

## 2014-09-07 LAB — GLUCOSE, CAPILLARY
GLUCOSE-CAPILLARY: 144 mg/dL — AB (ref 70–99)
GLUCOSE-CAPILLARY: 263 mg/dL — AB (ref 70–99)
Glucose-Capillary: 273 mg/dL — ABNORMAL HIGH (ref 70–99)
Glucose-Capillary: 149 mg/dL — ABNORMAL HIGH (ref 70–99)

## 2014-09-07 LAB — CBC
HCT: 34.8 % — ABNORMAL LOW (ref 39.0–52.0)
HEMOGLOBIN: 11.7 g/dL — AB (ref 13.0–17.0)
MCH: 33.2 pg (ref 26.0–34.0)
MCHC: 33.6 g/dL (ref 30.0–36.0)
MCV: 98.9 fL (ref 78.0–100.0)
PLATELETS: 187 10*3/uL (ref 150–400)
RBC: 3.52 MIL/uL — AB (ref 4.22–5.81)
RDW: 16.9 % — AB (ref 11.5–15.5)
WBC: 7.7 10*3/uL (ref 4.0–10.5)

## 2014-09-07 NOTE — Progress Notes (Signed)
I would favor CIR. He would not thrive in SNF.

## 2014-09-07 NOTE — Progress Notes (Signed)
Patient Name: [name] Date of Encounter: 09/07/2014  Principal Problem:   Renal failure (ARF), acute on chronic Active Problems:   Paroxysmal atrial fibrillation, DCCV 06/16/13   Pacemaker - [employer]   Essential hypertension   Acute renal failure   Diabetes mellitus type 2, uncontrolled   Elevated troponin I level   Acute on chronic diastolic heart failure   Acute renal failure syndrome   CHF (congestive heart failure), NYHA class II   Length of Stay: 5  SUBJECTIVE  The patient feels significantly better, the confusion has resolved, he was walking with PT yesterday. He is awaiting placement to a rehab facility, concerned as he wishes to go to [facility] rehab to be close to Dr [name].   CURRENT MEDS . allopurinol  100 mg Oral BID  . amiodarone  200 mg Oral QHS  . apixaban  2.5 mg Oral BID  . ferrous sulfate  325 mg Oral QODAY  . insulin aspart  0-15 Units Subcutaneous TID WC  . insulin aspart  0-5 Units Subcutaneous QHS  . insulin glargine  10 Units Subcutaneous QHS  . isosorbide mononitrate  60 mg Oral Daily  . latanoprost  1 drop Both Eyes QHS  . metoprolol tartrate  25 mg Oral Daily  . torsemide  80 mg Oral Daily     OBJECTIVE  Filed Vitals:   09/06/14 1353 09/06/14 2027 09/07/14 0035 09/07/14 0522  BP: 139/71 114/62  128/69  Pulse: 65 62 60 64  Temp: 98.7 F (37.1 C) 98 F (36.7 C)  98.2 F (36.8 C)  TempSrc: Oral Oral  Oral  Resp: 18 18 16 18   Height:      Weight:    179 lb 9.6 oz (81.466 kg)  SpO2: 98% 97%  95%    Intake/Output Summary (Last 24 hours) at 09/07/14 0911 Last data filed at 09/07/14 0625  Gross per 24 hour  Intake    780 ml  Output   1600 ml  Net   -820 ml   Filed Weights   09/05/14 0515 09/06/14 0512 09/07/14 0522  Weight: 190 lb 8 oz (86.41 kg) 180 lb 12.8 oz (82.01 kg) 179 lb 9.6 oz (81.466 kg)    PHYSICAL EXAM  General: Pleasant, NAD. Neuro: Alert and oriented X 3. Moves all extremities spontaneously. Psych: Normal  affect. HEENT:  Normal  Neck: Supple without bruits or JVD. Lungs:  Resp regular and unlabored, CTA. Heart: RRR no s3, s4, or murmurs. Abdomen: Soft, non-tender, non-distended, BS + x 4.  Extremities: No clubbing, cyanosis or edema. DP/PT/Radials 2+ and equal bilaterally.  Accessory Clinical Findings  CBC  Recent Labs  09/07/14 0458  WBC 7.7  HGB 11.7*  HCT 34.8*  MCV 98.9  PLT 814   Basic Metabolic Panel  Recent Labs  09/06/14 0807 09/07/14 0458  NA 138 138  K 3.4* 4.5  CL 103 100  CO2 30 28  GLUCOSE 100* 173*  BUN 49* 47*  CREATININE 2.50* 2.33*  CALCIUM 9.0 9.4   Radiology/Studies  Dg Chest 1 View  09/04/2014   CLINICAL DATA:  Congestive heart failure, shortness of breath  EXAM: CHEST  1 VIEW  COMPARISON:  08/26/2014  FINDINGS: Left-sided dual lead pacer in place. Mild cardiomegaly noted with central vascular congestion. New hazy perihilar opacities are identified with trace pleural effusions. No acute osseous finding. Left glenohumeral degenerative change.  IMPRESSION: Cardiomegaly with probable mild alveolar pulmonary edema and trace effusions.    TELE:  Atrial pacing   ASSESSMENT AND PLAN   1. Acute on chronic diastolic heart failure. 2. Altered mental status/metabolic encephalopathy, likely related to "sundowning" and metabolic/hemodynamic derangements from kidney failure, heart failure, and advanced age. Has been noted to have asterixis even as an outpatient 3. History of paroxysmal atrial fibrillation 4. History of urinary tract obstruction secondary to benign prostatic hypertrophy. Recent TUR. 5. Diabetes mellitus, with complications 6. Hypertension, essential  PLAN:  The patient diuresed significant amount of fluid, today's weight is 179 lbs (previously 176 lbs), what should be his new baseline based on Dr [name] note - in order to prevent fluid contraction.  He is tolerating po DEmadex well, I would hold off metolazone, daughter very educated and  uses only as needed for weight > 180. His crea continues to improve. He can be transferred to a rehab facility from cardiac standpoint. We will arrange for follow up.    Signed, [name] MD, [facility] 09/07/2014

## 2014-09-07 NOTE — Progress Notes (Signed)
Spoke with Dr. Kennon Holter in length concerning discharge plan. He is lending more toward SNF, but not sure which one at present time.

## 2014-09-07 NOTE — Progress Notes (Signed)
[facility] Progress Note   ZYGMUNT MCGLINN MWN:[id number] DOB: 08/12/1918 DOA: 09/02/2014 PCP: [name], MD  Brief narrative: [name] is a 81 y.o. male with past medical history of CAD, CKD, paroxysmal atrial fibrillation on anticoagulation, chronic diastolic heart failure presents to the hospital for generalized weakness. He had a TURP on 4/15. Since then, the patient states that he's had a very small amount of urine output with dark urine. He states that he's been feeling lightheaded when he gets up from bed in the morning. In the ER he is found to be dehydrated both clinically and based on lab work. He takes Zaroxolyn and torsemide at home which were held.. He has also been having hematuria after the TURP and apixaban was being held. While being hydrated for renal failure, he unfortunately developed fluid overload and therefore required IV diuretics-cardiology was managing this. He is now euvolemic and is only on oral Demadex-home dose of Zaroxolyn is currently on hold. Hematuria improved and the patient's urologist recommended resumption of apixaban. Having some blood-tinged urine at this time but no severe hematuria. His hospital course was complicated by acute encephalopathy which developed after being given a dose of IV Phenergan for nausea. This has resolved completely and he is back at his baseline mental status.   Subjective: [name] has no complaints. He was hopeful that he would be accepted to: Inpatient rehabilitation but they have declined to take him. At this point he is looking into a skilled nursing facility as he still feels he is too weak to go home.  Assessment/Plan: Principal Problem:   Renal failure (ARF), acute on chronic- hyperkalemia - -FeNa is 0.8 confirmed pre-renal cause-  -renal ultrasound shows mild b/l renal atrophy - Suspected to be due to Demadex and Zaroxolyn use at home during post-operative period when by mouth intake was poor - these were  held while he was re-hydrated-  discontinued IVF on 4/22 after discussion with [name]    Active Problems:  Acute on chronic diastolic CHF/pulmonary hypertension - EF 40-45%-severe pulmonary hypertension - -[name] started Lasix on 4/23 as creatinine had improved to baseline but despite this, he became fluid overloaded by 4/24- he is now euvolemic and being managed on oral Demadex -Dry weight is about 180 kg  Acute encephalopathy - Secondary to Phenergan given on 4/21 for a complaint of nausea and slow to resolve -Mental status returned to baseline on 4/24  Recent TURP- ongoing hematuria - Appears to be voiding well but did have some hematuria on admission - as hematuria resolved, apixaban was resumed but hematuria recurred and was quite severe - Per note from Dr. Janice Norrie on 4/25, okay to resume apixaban- having only mild hematuria now-continue to follow  Hypokalemia -Due to diuretics-replaced- recheck tomorrow- currently not on a daily dose of potassium    Paroxysmal atrial fibrillation, DCCV 06/16/13 -EKG reveals a paced rhythm -Continue amiodarone- resumed Eliquis    Elevated troponin I level-unspecified - Chronically elevated troponins-no chest pain-we'll stop checking    Essential hypertension - Continue Lopressor and Imdur    Diabetes mellitus type 2, uncontrolled -Continue Lantus at current dose-continue moderate dose sliding scale  Generalized weakness - PT eval- CIR has declined to take him due to the aggressiveness of therapy needed-SNF recommended - the patient is deciding on where he wants to go   Code Status: Full code  Family Communication:  Disposition Plan:  SNF  DVT prophylaxis: Apixaban Consultants: Cardiology, urology Procedures:  Antibiotics: Anti-infectives  Start     Dose/Rate Route Frequency Ordered Stop   09/02/14 0400  cefTRIAXone (ROCEPHIN) 1 g in dextrose 5 % 50 mL IVPB     1 g 100 mL/hr over 30 Minutes Intravenous  Once 09/02/14 0359  09/02/14 0505      Objective: Filed Weights   09/05/14 0515 09/06/14 0512 09/07/14 0522  Weight: 86.41 kg (190 lb 8 oz) 82.01 kg (180 lb 12.8 oz) 81.466 kg (179 lb 9.6 oz)    Intake/Output Summary (Last 24 hours) at 09/07/14 1320 Last data filed at 09/07/14 1054  Gross per 24 hour  Intake    780 ml  Output   1600 ml  Net   -820 ml     Vitals Filed Vitals:   09/06/14 2027 09/07/14 0035 09/07/14 0522 09/07/14 1014  BP: 114/62  128/69 113/68  Pulse: 62 60 64 62  Temp: 98 F (36.7 C)  98.2 F (36.8 C)   TempSrc: Oral  Oral   Resp: 18 16 18    Height:      Weight:   81.466 kg (179 lb 9.6 oz)   SpO2: 97%  95%     Exam:  General:  Pt is alert - no distress  HEENT: No icterus, No thrush  Cardiovascular: regular rate and rhythm, S1/S2 No murmur  Respiratory: Clear to auscultation bilaterally  Abdomen: Soft, +Bowel sounds, non tender, non distended, no guarding  MSK: No LE edema, cyanosis or clubbing  GU: condom catheter - urine in bag is mildly blood-tinged  Data Reviewed: Basic Metabolic Panel:  Recent Labs Lab 09/03/14 0541 09/04/14 0514 09/05/14 0540 09/06/14 0807 09/07/14 0458  NA 138 138 139 138 138  K 4.8 4.3 4.2 3.4* 4.5  CL 101 103 103 103 100  CO2 26 25 27 30 28   GLUCOSE 204* 174* 132* 100* 173*  BUN 65* 58* 54* 49* 47*  CREATININE 3.40* 2.88* 2.69* 2.50* 2.33*  CALCIUM 9.4 9.5 9.5 9.0 9.4   Liver Function Tests: No results for input(s): AST, ALT, ALKPHOS, BILITOT, PROT, ALBUMIN in the last 168 hours. No results for input(s): LIPASE, AMYLASE in the last 168 hours. No results for input(s): AMMONIA in the last 168 hours. CBC:  Recent Labs Lab 09/02/14 0305 09/04/14 0514 09/07/14 0458  WBC 6.9 9.0 7.7  NEUTROABS 6.2  --   --   HGB 12.5* 11.6* 11.7*  HCT 38.1* 35.4* 34.8*  MCV 99.5 99.4 98.9  PLT 218 215 187   Cardiac Enzymes:  Recent Labs Lab 09/02/14 0305 09/02/14 0910  TROPONINI 0.21* 0.25*   BNP (last 3 results)  Recent  Labs  07/16/14 1431 07/23/14 1827 09/04/14 1439  BNP 506.6* 359.1* 1383.6*    ProBNP (last 3 results)  Recent Labs  12/23/13 0535 04/04/14 0742 07/16/14 1213  PROBNP 4564.0* 4874.0* 460.0*    CBG:  Recent Labs Lab 09/06/14 0755 09/06/14 1204 09/06/14 1652 09/06/14 2142 09/07/14 1138  GLUCAP 88 262* 162* 163* 273*    Recent Results (from the past 240 hour(s))  Urine culture     Status: None   Collection Time: 09/02/14  4:02 AM  Result Value Ref Range Status   Specimen Description URINE, RANDOM  Final   Special Requests NONE  Final   Colony Count NO GROWTH Performed at [facility]   Final   Culture NO GROWTH Performed at [facility]   Final   Report Status 09/03/2014 FINAL  Final     Studies:  Recent x-ray  studies have been reviewed in detail by the Attending Physician  Scheduled Meds:  Scheduled Meds: . allopurinol  100 mg Oral BID  . amiodarone  200 mg Oral QHS  . apixaban  2.5 mg Oral BID  . ferrous sulfate  325 mg Oral QODAY  . insulin aspart  0-15 Units Subcutaneous TID WC  . insulin aspart  0-5 Units Subcutaneous QHS  . insulin glargine  10 Units Subcutaneous QHS  . isosorbide mononitrate  60 mg Oral Daily  . latanoprost  1 drop Both Eyes QHS  . metoprolol tartrate  25 mg Oral Daily  . torsemide  80 mg Oral Daily   Continuous Infusions:    Time spent on care of this patient: 35 min   [place], MD 09/07/2014, 1:20 PM  LOS: 5 days   [facility] Office  [phone] Pager - Text Page per [email or web address]  If 7PM-7AM, please contact night-coverage [email or web address]

## 2014-09-07 NOTE — Consult Note (Signed)
Physical Medicine and Rehabilitation Consult  Reason for Consult: Debility Referring Physician: Dr. Wynelle Cleveland   HPI: [name] is a 81 y.o. male with history of CKD, PAF/SSS, chronic diastolic heart failure, recent TURP who was admitted to [facility] on 09/02/14 with decrease in urine output, dark urine, dizziness and weakness.  He was found to be dehydrated and confused.  Diuretics were placed on hold and treated with gentle hydration.  Eliquis was resumed with close monitoring for recurrent hematuria. [name] following closely for input on cardiac issues and felt that sundowning related to metabolic encephalopathy as patient noted to be  having asterixis even on outpatient basis. IVF d/c and diuretics resumed due to upward trend in weight and concerns of fluid overload.   PT evaluation done yesterday and patient noted to have very unsteady gait as well as tremors inhibiting mobility.  CIR recommended by MD for follow up therapy.    ROS    Past Medical History  Diagnosis Date  . Hyperlipidemia   . Spinal stenosis   . Anemia   . Carotid artery disease     a. s/p prior carotid endarterectomy.  [name] PVD (peripheral vascular disease)   . Hyponatremia     Chronic  . CVA (cerebral infarction)     a. When asked to clarify patient says he was told thumb numbness may be TIA. He does not recall formal stroke. CT head results are only available through [facility], not in [facility].  . CKD (chronic kidney disease) stage 4, GFR 15-29 ml/min   . Long term (current) use of anticoagulants   . PAF (paroxysmal atrial fibrillation)     a. Asymptomatic. On Eliquis. CHADSVASC 7.  . Sick sinus syndrome     With DDD St. Jude PM, initially placed in 1993 by Dr. Nils Pyle. Device upgrade 10/2002 to DDD, by Dr. Rollene Fare complicated by bleeding. Subsequent gen change 2011.  [name] Hypertensive cardiovascular disease   . Chronic diastolic congestive heart failure     ECHO 05/20/12 LVEF estimated by 2D at 55-60%  . Arthritis     . Gait disorder   . Diabetic peripheral neuropathy associated with type 2 diabetes mellitus   . Diabetes mellitus     insulin dependent  . Elevated troponin I level 06/22/2014    a. 06/2014 felt due to demand ischemia.  . Chronic anticoagulation     For PAF, on Eliquis   . Urinary retention   . Pulmonary hypertension   . Foot drop, left 07/21/2014  . Asterixis 07/21/2014  . Hypertension   . Presence of permanent cardiac pacemaker   . Pneumonia     hx of pneumonia x 1     Past Surgical History  Procedure Laterality Date  . Lumbar laminectomy  1995  . Back surgery    . Total knee arthroplasty Left   . Quadriceps tendon repair    . Cataract extraction    . Carotid endarterectomy    . Yag laser application Right 2/44/0102    Procedure: YAG LASER CAPSULOTOMY OF RIGHT EYE;  Surgeon: [name], MD;  Location: [place];  Service: Ophthalmology;  Laterality: Right;  . Tonsillectomy    . Pacemaker insertion      DDD St. Jude PM, initially placed in 1993 by Dr. Nils Pyle. Device upgrade 10/2002 to DDD, by Dr. Rollene Fare complicated by bleeding.  . Carotid endarterectomy Right 2006  . Transurethral resection of prostate    . Cardioversion N/A 06/16/2013  Procedure: CARDIOVERSION BEDSIDE;  Surgeon: [name], MD;  Location: [place];  Service: Cardiovascular;  Laterality: N/A;  . Pacemaker generator change  12/09/2009    SJM Accent DR RF gen change by Dr [name]  . Insert / replace / remove pacemaker      2004   . Transurethral resection of prostate N/A 08/27/2014    Procedure: TRANSURETHRAL RESECTION OF THE PROSTATE WITH GYRUS INSTRUMENTS;  Surgeon: [name], MD;  Location: [facility];  Service: Urology;  Laterality: N/A;  . Cystoscopy N/A 08/27/2014    Procedure: CYSTOSCOPY;  Surgeon: [name], MD;  Location: [facility];  Service: Urology;  Laterality: N/A;    Family History  Problem Relation Age of Onset  . Multiple myeloma Father   . Hypertension Mother     Social History:   Single but has a caregiver who lives with him.  Independent with RW PTA.  Per   reports that he quit smoking about 56 years ago. His smoking use included Pipe and Cigarettes. He quit after 2 years of use. He has never used smokeless tobacco.   Per reports that he does not drink alcohol or use illicit drugs.    Allergies  Allergen Reactions  . Statins Other (See Comments)    rhabdomyolisis  . Aggrenox [Aspirin-Dipyridamole Er] Other (See Comments)    Dizziness  . Carvedilol Other (See Comments)    Dizziness  . Claritin [Loratadine] Other (See Comments)    Dizziness & drowsiness   . Cymbalta [Duloxetine Hcl]     Confusion   . Mucinex [Guaifenesin Er] Other (See Comments)    Dizziness, drowsiness  . Phenergan [Promethazine Hcl] Other (See Comments)    Causes confusion  . Plavix [Clopidogrel Bisulfate] Other (See Comments)    Dizziness  . Rapaflo [Silodosin] Other (See Comments)    Dizziness for about 30- 45  Minutes At time of preop appointment on 08/26/2014 patient denies any problems with this medication  . Travatan Z [Travoprost] Other (See Comments)    Eye irritation    Medications Prior to Admission  Medication Sig Dispense Refill  . allopurinol (ZYLOPRIM) 100 MG tablet Take 100 mg by mouth 2 (two) times daily.     [name] amiodarone (PACERONE) 200 MG tablet Take 1 tablet (200 mg total) by mouth at bedtime.    [name] apixaban (ELIQUIS) 2.5 MG TABS tablet Take 2.5 mg by mouth 2 (two) times daily.    . ferrous sulfate 325 (65 FE) MG tablet Take 325 mg by mouth every other day.    . fexofenadine (ALLEGRA) 180 MG tablet Take 180 mg by mouth daily.    . insulin aspart (NOVOLOG FLEXPEN) 100 UNIT/ML FlexPen Inject 2-9 Units into the skin 3 (three) times daily with meals. Sliding scale If at lunch gbc is between 159 do not give any. If 299 or above give 3 units. At bedtime it is always 9 units    . insulin glargine (LANTUS) 100 UNIT/ML injection Inject 9-10 Units into the skin at bedtime. Sliding  scale If at lunch gbc is between 159 do not give any. If 299 or above give 3 units. At bedtime it is always 9 unitsComment exceeds the 0 character limit    . isosorbide mononitrate (IMDUR) 60 MG 24 hr tablet Take 1 tablet (60 mg total) by mouth daily. 30 tablet 5  . metolazone (ZAROXOLYN) 2.5 MG tablet Take 2.5 mg by mouth every Monday, Wednesday, and Friday.    . metoprolol tartrate (LOPRESSOR)  25 MG tablet Take 1 tablet (25 mg total) by mouth daily. 30 tablet 6  . nitroGLYCERIN (NITROSTAT) 0.4 MG SL tablet Place 1 tablet (0.4 mg total) under the tongue every 5 (five) minutes as needed for chest pain. 25 tablet 3  . OVER THE COUNTER MEDICATION Take 1 packet by mouth daily.    . polyethylene glycol (MIRALAX / GLYCOLAX) packet Take 17 g by mouth 2 (two) times daily as needed for mild constipation. For constipation    . Polyvinyl Alcohol-Povidone (REFRESH OP) Place 1 drop into both eyes 2 (two) times daily. Refresh Gel Opth    . potassium chloride SA (K-DUR,KLOR-CON) 20 MEQ tablet Take 2 tablets (40 mEq total) by mouth daily. (Patient taking differently: Take 20 mEq by mouth daily. ) 30 tablet 6  . Tafluprost (ZIOPTAN) 0.0015 % SOLN Place 1 drop into both eyes at bedtime. Use at night    . torsemide (DEMADEX) 20 MG tablet Take 4 tablets (80 mg total) by mouth daily. 120 tablet 3    Home: Home Living Family/patient expects to be discharged to:: Private residence Living Arrangements: Spouse/significant other Available Help at Discharge: Personal care attendant, Available 24 hours/day, Family Type of Home: House Home Access: Stairs to enter [employer] of Steps: 3 Entrance Stairs-Rails: Right, Left, Can reach both Home Layout: One level Home Equipment: Walker - 4 wheels, Tub bench, Hand held shower head, Walker - 2 wheels, Adaptive equipment Adaptive Equipment: Reacher, Sock aid, Long-handled shoe horn, Long-handled sponge  Functional History: Prior Function Level of Independence:  Needs assistance Gait / Transfers Assistance Needed: Pt confused, no family present. Per PT eval 1 month ago pt had been independent with rollator until several weeks ago but since then, his knees have been buckling and has been experiencing muscle jerks and caregiver has had to help him to sit quickly ADL's / Homemaking Assistance Needed: assisted for tub bench transfer, to wash feet and back, for LB dressing, meal prep and housekeeping. Pt routinely sits to perform grooming on his rollator. Comments: used to golf, plays computer games, uses computer Functional Status:  Mobility: Bed Mobility Overal bed mobility: Needs Assistance Bed Mobility: Supine to Sit Rolling: Mod assist Sidelying to sit: Max assist General bed mobility comments: max A to raise trunk and use of bed pad to scoot hips to EOB  Transfers Overall transfer level: Needs assistance Equipment used: Rolling walker (2 wheeled) Transfers: Sit to/from Stand Sit to Stand: Min assist, Mod assist General transfer comment: 25% VC's on proper hand placement Ambulation/Gait Ambulation/Gait assistance: +2 physical assistance, +2 safety/equipment, Mod assist Ambulation Distance (Feet): 24 Feet Assistive device: Rolling walker (2 wheeled) Gait Pattern/deviations: Step-to pattern, Step-through pattern, Trunk flexed, Narrow base of support, Shuffle Gait velocity: decreased Gait velocity interpretation: at or above normal speed for age/gender General Gait Details: very weak/unsteady gait.  Required + 2 assist for safety.  Limited activity tolerance and MAX c/o weakness/fatigue.     ADL:    Cognition: Cognition Overall Cognitive Status: Impaired/Different from baseline Orientation Level: Oriented X4 Cognition Arousal/Alertness: Awake/alert Behavior During Therapy: WFL for tasks assessed/performed Overall Cognitive Status: Impaired/Different from baseline Area of Impairment: Attention, Safety/judgement (slow/delayed level of  alertness) Orientation Level: Place, Time, Situation General Comments: Per RN confusion may be related to medication.  Blood pressure 128/69, pulse 64, temperature 98.2 F (36.8 C), temperature source Oral, resp. rate 18, height '5\' 7"'  (1.702 m), weight 81.466 kg (179 lb 9.6 oz), SpO2 95 %. Physical Exam  Results for  orders placed or performed during the hospital encounter of 09/02/14 (from the past 24 hour(s))  Glucose, capillary     Status: Abnormal   Collection Time: 09/06/14 12:04 PM  Result Value Ref Range   Glucose-Capillary 262 (H) 70 - 99 mg/dL  Glucose, capillary     Status: Abnormal   Collection Time: 09/06/14  4:52 PM  Result Value Ref Range   Glucose-Capillary 162 (H) 70 - 99 mg/dL  Glucose, capillary     Status: Abnormal   Collection Time: 09/06/14  9:42 PM  Result Value Ref Range   Glucose-Capillary 163 (H) 70 - 99 mg/dL   Comment 1 Notify RN    Comment 2 Document in Chart   CBC     Status: Abnormal   Collection Time: 09/07/14  4:58 AM  Result Value Ref Range   WBC 7.7 4.0 - 10.5 K/uL   RBC 3.52 (L) 4.22 - 5.81 MIL/uL   Hemoglobin 11.7 (L) 13.0 - 17.0 g/dL   HCT 34.8 (L) 39.0 - 52.0 %   MCV 98.9 78.0 - 100.0 fL   MCH 33.2 26.0 - 34.0 pg   MCHC 33.6 30.0 - 36.0 g/dL   RDW 16.9 (H) 11.5 - 15.5 %   Platelets 187 150 - 400 K/uL  Basic metabolic panel     Status: Abnormal   Collection Time: 09/07/14  4:58 AM  Result Value Ref Range   Sodium 138 135 - 145 mmol/L   Potassium 4.5 3.5 - 5.1 mmol/L   Chloride 100 96 - 112 mmol/L   CO2 28 19 - 32 mmol/L   Glucose, Bld 173 (H) 70 - 99 mg/dL   BUN 47 (H) 6 - 23 mg/dL   Creatinine, Ser 2.33 (H) 0.50 - 1.35 mg/dL   Calcium 9.4 8.4 - 10.5 mg/dL   GFR calc non Af Amer 22 (L) >90 mL/min   GFR calc Af Amer 26 (L) >90 mL/min   Anion gap 10 5 - 15   No results found.  Assessment/Plan: Diagnosis: debility/encephalopathy 1. Does the need for close, 24 hr/day medical supervision in concert with the patient's rehab needs  make it unreasonable for this patient to be served in a less intensive setting? No 2. Co-Morbidities requiring supervision/potential complications: see above 3. Due to bladder management, bowel management and disease management, does the patient require 24 hr/day rehab nursing? No 4. Does the patient require coordinated care of a physician, rehab nurse, PT (1-2 hrs/day, 5 days/week) and OT (1-2 hrs/day, 5 days/week) to address physical and functional deficits in the context of the above medical diagnosis(es)? No Addressing deficits in the following areas: balance, endurance, locomotion, strength, transferring, bowel/bladder control, bathing, feeding and grooming 5. Can the patient actively participate in an intensive therapy program of at least 3 hrs of therapy per day at least 5 days per week? No and Potentially 6. The potential for patient to make measurable gains while on inpatient rehab is fair 7. Anticipated functional outcomes upon discharge from inpatient rehab are n/a  with PT, n/a with OT, n/a with SLP. 8. Estimated rehab length of stay to reach the above functional goals is: n/a 9. Does the patient have adequate social supports and living environment to accommodate these discharge functional goals? Potentially 10. Anticipated D/C setting: Other 11. Anticipated post D/C treatments: N/A 12. Overall Rehab/Functional Prognosis: fair  RECOMMENDATIONS: This patient's condition is appropriate for continued rehabilitative care in the following setting: SNF Patient has agreed to participate in recommended program. N/A  Note that insurance prior authorization may be required for reimbursement for recommended care.  Comment: Best suited for a less aggressive therapy approach given age and cognitive deficits.   Meredith Staggers, MD, [facility] 09/07/2014     09/07/2014

## 2014-09-07 NOTE — Progress Notes (Signed)
Physical Therapy Treatment Patient Details Name: [name] MRN: [id number] DOB: 09-01-1918 Today's Date: 09/07/2014    History of Present Illness 81 yo male with CHF (EF 55-60%), Dm2, Arthritis, CKD stage 4, spinal stenosis, CVA, L TKA, quad tendon repair admitted with acute on chronic renal failure. Per chart pt also fell at home.     PT Comments    PT was in bed; assisted pt to sit up on EOB; while sitting on EOB stated felt a little dizzy "just need to let it settle"; EOB of placed white back brace and L AFO and shoes on for patient; while EOB MD entered room and spoke to patient; assisted pt to BR stated possible BOWEL movnt, no bowel mvnt just "gas"; Ambulated to unit hall 60 ft rest in chair x2 Knees started buckling towards end and final rest break; rolled patient back into room and remained in chair with caretaker present.   Follow Up Recommendations  Supervision/Assistance - 24 hour;Home health PT; SNF     Equipment Recommendations       Recommendations for Other Services       Precautions / Restrictions Precautions Precautions: Fall Precaution Comments: wears B shoes (L AFO)  and back corsett Restrictions Weight Bearing Restrictions: No    Mobility  Bed Mobility Overal bed mobility: Needs Assistance Bed Mobility: Supine to Sit Rolling: Mod assist         General bed mobility comments: Max A to raise trunk OOB and to scoot to EOB  Transfers Overall transfer level: Needs assistance Equipment used: Rolling walker (2 wheeled) Transfers: Sit to/from Stand Sit to Stand: Min assist;Mod assist         General transfer comment: VC on proper hand placement   Ambulation/Gait Ambulation/Gait assistance: Mod assist;+2 safety/equipment Ambulation Distance (Feet): 60 Feet  (30 feet x 2 one sitting rest break) Assistive device: Rolling walker (2 wheeled) Gait Pattern/deviations: Step-through pattern;Trunk flexed;Narrow base of support;Shuffle     General Gait  Details: Very weak and unsteady; utlizing own shoes L LE AFO; both feet close together when walking; c/o of tiredness; Cues on posture   Stairs            Wheelchair Mobility    Modified Rankin (Stroke Patients Only)       Balance                                    Cognition Arousal/Alertness: Awake/alert Behavior During Therapy: WFL for tasks assessed/performed Overall Cognitive Status: Impaired/Different from baseline Area of Impairment: Attention;Safety/judgement                    Exercises      General Comments        Pertinent Vitals/Pain Pain Assessment: No/denies pain    Home Living                      Prior Function            PT Goals (current goals can now be found in the care plan section) Progress towards PT goals: Progressing toward goals    Frequency  Min 3X/week    PT Plan      Co-evaluation             End of Session Equipment Utilized During Treatment: Gait belt Activity Tolerance: Patient limited by fatigue Patient left: in chair;with call bell/phone  within reach;with chair alarm set;with nursing/sitter in room     Time:   1150-1224    Charges:                       G Codes:      [place] PTA 09/07/2014, 1:24 PM   Reviewed and agree with above  Rica Koyanagi  PTA [facility]  Acute  Rehab Pager      [phone]

## 2014-09-07 NOTE — Progress Notes (Signed)
Inpatient Rehabilitation  I met with Dr. Kennon Holter to discuss Dr. Naaman Plummer' recommendations from his earlier consult.  Pt. Is aware that he would likely not tolerate the 3 hours of therapies and that SNF is likely best rehabilitation environment for him.  Pt. is in agreement.  I updated Cookie McGibboney, CM and Raynaldo Opitz,  SW of these recommendations.  I will sign off.  Please call if questions.  [place] Admissions Coordinator Cell [phone] Office [phone]

## 2014-09-08 ENCOUNTER — Ambulatory Visit: Payer: Medicare Other | Admitting: Interventional Cardiology

## 2014-09-08 DIAGNOSIS — R319 Hematuria, unspecified: Secondary | ICD-10-CM

## 2014-09-08 LAB — BASIC METABOLIC PANEL
Anion gap: 8 (ref 5–15)
BUN: 48 mg/dL — ABNORMAL HIGH (ref 6–23)
CO2: 33 mmol/L — AB (ref 19–32)
Calcium: 9.6 mg/dL (ref 8.4–10.5)
Chloride: 98 mmol/L (ref 96–112)
Creatinine, Ser: 2.55 mg/dL — ABNORMAL HIGH (ref 0.50–1.35)
GFR calc Af Amer: 23 mL/min — ABNORMAL LOW (ref >90)
GFR, EST NON AFRICAN AMERICAN: 20 mL/min — AB (ref >90)
GLUCOSE: 118 mg/dL — AB (ref 70–99)
POTASSIUM: 4.3 mmol/L (ref 3.5–5.1)
SODIUM: 139 mmol/L (ref 135–145)

## 2014-09-08 LAB — GLUCOSE, CAPILLARY
Glucose-Capillary: 94 mg/dL (ref 70–99)
Glucose-Capillary: 283 mg/dL — ABNORMAL HIGH (ref 70–99)

## 2014-09-08 MED ORDER — METOLAZONE 2.5 MG PO TABS: 2.5000 mg | ORAL_TABLET | Freq: Every day | ORAL | Status: DC | PRN

## 2014-09-08 MED ORDER — INSULIN GLARGINE 100 UNIT/ML ~~LOC~~ SOLN: 8.0000 [IU] | Freq: Every day | SUBCUTANEOUS | Status: DC

## 2014-09-08 MED ORDER — POTASSIUM CHLORIDE CRYS ER 20 MEQ PO TBCR: 20.0000 meq | EXTENDED_RELEASE_TABLET | Freq: Every day | ORAL | Status: AC

## 2014-09-08 NOTE — Progress Notes (Signed)
[facility] called for transport.     Nadra Hritz, LCSW [facility] Clinical Social Worker cell #: [phone]  

## 2014-09-08 NOTE — Clinical Social Work Placement (Signed)
Patient is set to discharge to [facility] today. Patient & caregiver, [name] & son, [name] aware via phone aware. Discharge packet given to RN, Ify. [facility] will be called for transport once admission paperwork is complete for [facility].     Raynaldo Opitz, [facility] Clinical Social Worker cell #: [phone]   CLINICAL SOCIAL WORK PLACEMENT  NOTE  Date:  09/08/2014  Patient Details  Name: [name] MRN: [id number] Date of Birth: 1918/08/29  Clinical Social Work is seeking post-discharge placement for this patient at the [place] level of care (*CSW will initial, date and re-position this form in  chart as items are completed):  Yes   Patient/family provided with [place] Work Department's list of facilities offering this level of care within the geographic area requested by the patient (or if unable, by the patient's family).  Yes   Patient/family informed of their freedom to choose among providers that offer the needed level of care, that participate in Medicare, Medicaid or managed care program needed by the patient, have an available bed and are willing to accept the patient.  Yes   Patient/family informed of 's ownership interest in [facility] and [facility], as well as of the fact that they are under no obligation to receive care at these facilities.  PASRR submitted to EDS on 09/06/14     PASRR number received on 09/06/14     Existing PASRR number confirmed on       FL2 transmitted to all facilities in geographic area requested by pt/family on 09/06/14     FL2 transmitted to all facilities within larger geographic area on       Patient informed that his/her managed care company has contracts with or will negotiate with certain facilities, including the following:        Yes   Patient/family informed of bed offers received.  Patient chooses bed at [facility]     Physician recommends  and patient chooses bed at      Patient to be transferred to [facility] on 09/08/14.  Patient to be transferred to facility by [facility]     Patient family notified on 09/08/14 of transfer.  Name of family member notified:  patient's caregiver, [name] at bedside & son, [name] via phone     PHYSICIAN       Additional Comment:    _______________________________________________ Standley Brooking, LCSW 09/08/2014, 10:46 AM

## 2014-09-08 NOTE — Progress Notes (Signed)
   Weight is 176 lbs.  Would not give Zaroxolyn but would use when/if weight starts to rise.. Continue Demadex 80 mg daily.  ? Foley should be removed, but will defer to Dr. Janice Norrie.  Needs daily weights and 2 gm sodium diet at SNF.  I worry about responsiveness at SNF and that dyspnea may not be appropriately managed. If sluggish response, he may be better at home. However, improved strength will be beneficial for home environment.  We will see how it goes.

## 2014-09-08 NOTE — Discharge Summary (Signed)
Physician Discharge Summary  [name]:[id number] DOB: 07/11/1918 DOA: 09/02/2014  PCP: [name], MD  Admit date: 09/02/2014 Discharge date: 09/08/2014  Time spent: 50 minutes  Recommendations for Outpatient Follow-up:  1. Please weight as soon as patient arrives and record this as his dry weight- Given an additional dose of Zaroxolyn 2.5 30 min prior to Demedex if he gains more than 3 lbs or becomes short of breath- keep in touch with Dr [name] who is his cardiologist 2. 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 He had a TURP on 4/15. Since then, the patient states that he's had a very small amount of urine output with dark urine. He states that he's been feeling lightheaded when he gets up from bed in the morning. In the ER he is found to be dehydrated both clinically and based on lab work. He takes Zaroxolyn and torsemide at home which were held. He was also having hematuria after the TURP and apixaban was being held. While being hydrated for renal failure, he unfortunately developed fluid overload and therefore required IV diuretics-cardiology was managing this. He is now euvolemic and is only on oral Demadex-home dose of Zaroxolyn is currently on hold. Hematuria improved and the patient's urologist  recommended resumption of apixaban. Having some mild blood-tinged urine at this time but no severe hematuria. His hospital course was complicated by acute encephalopathy which developed after being given a dose of IV Phenergan for nausea. This has resolved completely and he is back at his baseline mental status.   Hospital Course:  Principal Problem:  Renal failure (ARF), acute on chronic- hyperkalemia - -FeNa is 0.8 confirmed pre-renal cause-  -renal ultrasound shows mild b/l renal atrophy - Suspected to be due to Demadex and Zaroxolyn use at home during post-operative period when by mouth intake was poor - these were held while he was re-hydrated- discontinued IVF on 4/22 after discussion with [name]  Active Problems:  Acute on chronic diastolic CHF/pulmonary hypertension - EF 40-45%-severe pulmonary hypertension - -[name] recommended with hold IVF on 4/22 when Cr reached 3.40 - he started Lasix on 4/23 as creatinine had improved to baseline at 2.88 but despite this, he became fluid overloaded by 4/24- he is now euvolemic and being managed on oral Demadex -Dry weight is about 180 kg- he is currently 176 lb - weights need to be followed closely and Zaroxolyn given if weight climes above 180  Acute encephalopathy - Secondary to Phenergan given on 4/21 for a complaint of nausea and slow to resolve -Mental status returned to baseline on 4/24  Recent TURP- ongoing mild hematuria - Appears to be voiding well but did have some hematuria on admission - as hematuria resolved, apixaban was resumed but hematuria recurred and was quite severe - Per note from Dr. Janice Norrie on  4/25, okay to resume apixaban- having only mild hematuria now-continue to follow- he currently has a condom cath- have discussed with him to allow [place] to remove this and transition him to using a urinal  Hypokalemia -Due to diuretics-replaced-   Paroxysmal atrial fibrillation, DCCV 06/16/13 -EKG reveals a paced  rhythm -Continue amiodarone- resumed Eliquis   Elevated troponin I level-unspecified - Chronically elevated troponins-no chest pain-we'll stop checking   Essential hypertension - Continue Lopressor and Imdur   Diabetes mellitus type 2, uncontrolled -Continue Lantus at current dose-continue moderate dose sliding scale  Generalized weakness - PT eval- CIR has declined to take him due to the aggressiveness of therapy needed-SNF recommended -    Consultations:  Cardiology  Urology- Dr Janice Norrie  Discharge Exam: Filed Weights   09/06/14 0512 09/07/14 0522 09/08/14 0500  Weight: 82.01 kg (180 lb 12.8 oz) 81.466 kg (179 lb 9.6 oz) 79.924 kg (176 lb 3.2 oz)   Filed Vitals:   09/08/14 0500  BP: 142/70  Pulse: 64  Temp: 97.7 F (36.5 C)  Resp: 18    General: AAO x 3, no distress Cardiovascular: RRR, no murmurs  Respiratory: clear to auscultation bilaterally GI: soft, non-tender, non-distended, bowel sound positive  Discharge Instructions You were cared for by a hospitalist during your hospital stay. If you have any questions about your discharge medications or the care you received while you were in the hospital after you are discharged, you can call the unit and asked to speak with the hospitalist on call if the hospitalist that took care of you is not available. Once you are discharged, your primary care physician will handle any further medical issues. Please note that NO REFILLS for any discharge medications will be authorized once you are discharged, as it is imperative that you return to your primary care physician (or establish a relationship with a primary care physician if you do not have one) for your aftercare needs so that they can reassess your need for medications and monitor your lab values.      Discharge Instructions    Discharge instructions    Complete by:  As directed   Low sodium, heart healthy, carb modified diet Bmet in 4 days     Increase activity slowly     Complete by:  As directed             Medication List    TAKE these medications        allopurinol 100 MG tablet  Commonly known as:  ZYLOPRIM  Take 100 mg by mouth 2 (two) times daily.     amiodarone 200 MG tablet  Commonly known as:  PACERONE  Take 1 tablet (200 mg total) by mouth at bedtime.     apixaban 2.5 MG Tabs tablet  Commonly known as:  ELIQUIS  Take 2.5 mg by mouth 2 (two) times daily.     ferrous sulfate 325 (65 FE) MG tablet  Take 325 mg by mouth every other day.     fexofenadine 180 MG tablet  Commonly known as:  ALLEGRA  Take 180 mg by mouth daily.     insulin glargine 100 UNIT/ML injection  Commonly known as:  LANTUS  Inject 0.08 mLs (8 Units total) into the skin at bedtime. Sliding scale If at lunch gbc is between 159 do not give any. If 299 or above give 3 units. At bedtime it is always 9 unitsComment exceeds the 0 character limit     isosorbide mononitrate 60  MG 24 hr tablet  Commonly known as:  IMDUR  Take 1 tablet (60 mg total) by mouth daily.     metolazone 2.5 MG tablet  Commonly known as:  ZAROXOLYN  Take 1 tablet (2.5 mg total) by mouth daily as needed (take 30 min prior to Demadex, if weight goes up 3 pounds from dry weigth or develops shortness of breath).     metoprolol tartrate 25 MG tablet  Commonly known as:  LOPRESSOR  Take 1 tablet (25 mg total) by mouth daily.     nitroGLYCERIN 0.4 MG SL tablet  Commonly known as:  NITROSTAT  Place 1 tablet (0.4 mg total) under the tongue every 5 (five) minutes as needed for chest pain.     NOVOLOG FLEXPEN 100 UNIT/ML FlexPen  Generic drug:  insulin aspart  - Inject 2-9 Units into the skin 3 (three) times daily with meals. Sliding scale  - If at lunch gbc is between 159 do not give any. If 299 or above give 3 units. At bedtime it is always 9 units     OVER THE COUNTER MEDICATION  Take 1 packet by mouth daily.     polyethylene glycol packet  Commonly known as:  MIRALAX / GLYCOLAX  Take 17  g by mouth 2 (two) times daily as needed for mild constipation. For constipation     potassium chloride SA 20 MEQ tablet  Commonly known as:  K-DUR,KLOR-CON  Take 1 tablet (20 mEq total) by mouth daily.     REFRESH OP  Place 1 drop into both eyes 2 (two) times daily. Refresh Gel Opth     torsemide 20 MG tablet  Commonly known as:  DEMADEX  Take 4 tablets (80 mg total) by mouth daily.     ZIOPTAN 0.0015 % Soln  Generic drug:  Tafluprost  Place 1 drop into both eyes at bedtime. Use at night       Allergies  Allergen Reactions  . Statins Other (See Comments)    rhabdomyolisis  . Aggrenox [Aspirin-Dipyridamole Er] Other (See Comments)    Dizziness  . Carvedilol Other (See Comments)    Dizziness  . Claritin [Loratadine] Other (See Comments)    Dizziness & drowsiness   . Cymbalta [Duloxetine Hcl]     Confusion   . Mucinex [Guaifenesin Er] Other (See Comments)    Dizziness, drowsiness  . Phenergan [Promethazine Hcl] Other (See Comments)    Causes severe confusion  . Plavix [Clopidogrel Bisulfate] Other (See Comments)    Dizziness  . Rapaflo [Silodosin] Other (See Comments)    Dizziness for about 30- 45  Minutes At time of preop appointment on 08/26/2014 patient denies any problems with this medication  . Travatan Z [Travoprost] Other (See Comments)    Eye irritation   Follow-up Information    Follow up with [name], MD. Go on 09/09/2014.   Specialty:  Internal Medicine   Why:  at 1pm for hospital follow up   Contact information:   [address] Kris Hartmann [place] [address] [phone]        The results of significant diagnostics from this hospitalization (including imaging, microbiology, ancillary and laboratory) are listed below for reference.    Significant Diagnostic Studies: Dg Chest 1 View  09/04/2014   CLINICAL DATA:  Congestive heart failure, shortness of breath  EXAM: CHEST  1 VIEW  COMPARISON:  08/26/2014  FINDINGS: Left-sided dual lead  pacer in place. Mild cardiomegaly noted with central vascular congestion. New hazy  perihilar opacities are identified with trace pleural effusions. No acute osseous finding. Left glenohumeral degenerative change.  IMPRESSION: Cardiomegaly with probable mild alveolar pulmonary edema and trace effusions.   Electronically Signed   By: Conchita Paris M.D.   On: 09/04/2014 15:34   Dg Chest 2 View  08/26/2014   CLINICAL DATA:  Preop TURP and cystoscopy.  History of CHF.  EXAM: CHEST  2 VIEW  COMPARISON:  07/23/2014  FINDINGS: Dual lead pacemaker remains in place. Cardiac silhouette remains mildly enlarged. The patient has taken a slightly greater inspiration than on the prior study and there is improved aeration of the lung bases with mild bibasilar opacities remaining. There are small bilateral pleural effusions. No pneumothorax is identified. No acute osseous abnormality is identified.  IMPRESSION: Improved aeration of the lung bases. Remaining bibasilar opacities may reflect mild residual edema. Small bilateral pleural effusions.   Electronically Signed   By: Logan Bores   On: 08/26/2014 09:17   US Renal  09/02/2014   CLINICAL DATA:  Chronic kidney disease.  EXAM: RENAL/URINARY TRACT ULTRASOUND COMPLETE  COMPARISON:  None.  FINDINGS: Right Kidney:  Length: 9.8 cm. Echogenicity within normal limits. No mass or hydronephrosis visualized.  Left Kidney:  Length: 9.2 cm. 1.3 cm cyst is noted. Echogenicity within normal limits. No mass or hydronephrosis visualized.  Bladder:  Decompressed secondary to Foley catheter.  IMPRESSION: Mild bilateral renal atrophy is noted. No hydronephrosis or renal obstruction is noted.   Electronically Signed   By: Marijo Conception, M.D.   On: 09/02/2014 16:32    Microbiology: Recent Results (from the past 240 hour(s))  Urine culture     Status: None   Collection Time: 09/02/14  4:02 AM  Result Value Ref Range Status   Specimen Description URINE, RANDOM  Final   Special Requests  NONE  Final   Colony Count NO GROWTH Performed at [facility]   Final   Culture NO GROWTH Performed at [facility]   Final   Report Status 09/03/2014 FINAL  Final     Labs: Basic Metabolic Panel:  Recent Labs Lab 09/04/14 0514 09/05/14 0540 09/06/14 0807 09/07/14 0458 09/08/14 0436  NA 138 139 138 138 139  K 4.3 4.2 3.4* 4.5 4.3  CL 103 103 103 100 98  CO2 25 27 30 28  33*  GLUCOSE 174* 132* 100* 173* 118*  BUN 58* 54* 49* 47* 48*  CREATININE 2.88* 2.69* 2.50* 2.33* 2.55*  CALCIUM 9.5 9.5 9.0 9.4 9.6   Liver Function Tests: No results for input(s): AST, ALT, ALKPHOS, BILITOT, PROT, ALBUMIN in the last 168 hours. No results for input(s): LIPASE, AMYLASE in the last 168 hours. No results for input(s): AMMONIA in the last 168 hours. CBC:  Recent Labs Lab 09/02/14 0305 09/04/14 0514 09/07/14 0458  WBC 6.9 9.0 7.7  NEUTROABS 6.2  --   --   HGB 12.5* 11.6* 11.7*  HCT 38.1* 35.4* 34.8*  MCV 99.5 99.4 98.9  PLT 218 215 187   Cardiac Enzymes:  Recent Labs Lab 09/02/14 0305 09/02/14 0910  TROPONINI 0.21* 0.25*   BNP: BNP (last 3 results)  Recent Labs  07/16/14 1431 07/23/14 1827 09/04/14 1439  BNP 506.6* 359.1* 1383.6*    ProBNP (last 3 results)  Recent Labs  12/23/13 0535 04/04/14 0742 07/16/14 1213  PROBNP 4564.0* 4874.0* 460.0*    CBG:  Recent Labs Lab 09/07/14 0728 09/07/14 1138 09/07/14 1715 09/07/14 2155 09/08/14 0727  GLUCAP 144* 273* 263*  149* 94       SignedDebbe Odea, MD [facility] 09/08/2014, 8:41 AM

## 2014-09-08 NOTE — Progress Notes (Signed)
Physical Therapy Treatment Patient Details Name: [name] MRN: [id number] DOB: 1918/11/29 Today's Date: 09/08/2014    History of Present Illness 81 yo male with CHF (EF 55-60%), Dm2, Arthritis, CKD stage 4, spinal stenosis, CVA, L TKA, quad tendon repair admitted with acute on chronic renal failure. Per chart pt also fell at home.     PT Comments    Assisted pt OOB to amb to BR then in hallway.  Pt progressing slowly and remains weak and unsteady with gait.  HIGH FALL RISK.  Follow Up Recommendations  SNF (pt and family agree to [name] but if they don't like it they plan to not stay long     Equipment Recommendations       Recommendations for Other Services       Precautions / Restrictions Precautions Precautions: Fall Precaution Comments: wears B shoes (L AFO)  and back corsett Restrictions Weight Bearing Restrictions: No    Mobility  Bed Mobility Overal bed mobility: Needs Assistance Bed Mobility: Supine to Sit Rolling: Mod assist         General bed mobility comments: Max A to raise trunk OOB and to scoot to EOB  Transfers Overall transfer level: Needs assistance Equipment used: Rolling walker (2 wheeled) Transfers: Sit to/from Stand Sit to Stand: Min assist;Mod assist         General transfer comment: VC on proper hand placement and increased time  Ambulation/Gait   Ambulation Distance (Feet): 45 Feet Assistive device: Rolling walker (2 wheeled) Gait Pattern/deviations: Step-through pattern;Trunk flexed;Narrow base of support Gait velocity: decreased   General Gait Details: amb from bed to BR then in hallway.  Pt reports mild c/o dizziness.  vitals taken: BP 150/70, HR 60 ans RA 99%.  Recliner brought to pt.  Pt progressing with gait slowly.  Remains unsteady and weak.  HIGH FALL RISK.   Stairs            Wheelchair Mobility    Modified Rankin (Stroke Patients Only)       Balance                                     Cognition                            Exercises      General Comments        Pertinent Vitals/Pain      Home Living                      Prior Function            PT Goals (current goals can now be found in the care plan section) Progress towards PT goals: Progressing toward goals    Frequency  Min 3X/week    PT Plan      Co-evaluation             End of Session Equipment Utilized During Treatment: Gait belt Activity Tolerance: Patient limited by fatigue Patient left: in chair;with call bell/phone within reach;with chair alarm set;with nursing/sitter in room;with family/visitor present     Time: 1040-1105 PT Time Calculation (min) (ACUTE ONLY): 25 min  Charges:  $Gait Training: 8-22 mins $Therapeutic Activity: 8-22 mins  G Codes:      Rica Koyanagi  PTA [facility]  Acute  Rehab Pager      [phone]

## 2014-09-08 NOTE — Progress Notes (Signed)
Patient discharged to SNF via ambulance, discharge packet prepared by CSW and given to EMS driver for facility. Patient alert and oriented, denies any pain/distress, no wound noted, skin intact. Patient able to void after foley removal. Caregiver at the bedside during transport/discharge.

## 2014-09-08 NOTE — Progress Notes (Signed)
CSW received call from [place] at [facility] that when patient arrived to SNF and got settled in, patient's caregiver persuaded patient to return home that she could provided the same care for him there. Patient left within hours of being admitted to [facility].    Raynaldo Opitz, [facility] Clinical Social Worker cell #: [phone]

## 2014-09-09 ENCOUNTER — Telehealth: Payer: Self-pay | Admitting: Interventional Cardiology

## 2014-09-09 DIAGNOSIS — I5032 Chronic diastolic (congestive) heart failure: Secondary | ICD-10-CM

## 2014-09-09 NOTE — Telephone Encounter (Signed)
Please arrange for home PT with [facility]  Dx: CHF, Post TUR; Frailty

## 2014-09-09 NOTE — Progress Notes (Signed)
A call to Dr. Macky Lower home and to his sitter [name] who revealed that pt([name]) did not stay at [facility]. Sitter [name] states, "I called Dr. Smith(PCP), and will assist in [facility] or another SNF.

## 2014-09-10 NOTE — Telephone Encounter (Signed)
Ref for @ home PT faxed to [facility] fax [phone]

## 2014-09-13 ENCOUNTER — Telehealth: Payer: Self-pay

## 2014-09-13 NOTE — Telephone Encounter (Signed)
Unable to lmom voicemail full. Per Dr.Smith pt appt scheduled for Fri 5/6 @ 9am Pt needs a bmet the same day

## 2014-09-13 NOTE — Telephone Encounter (Signed)
Spoke with Rosana Hoes PT form [facility]. He was out to the pt home today. Verbal order given to continue at home PT

## 2014-09-13 NOTE — Telephone Encounter (Signed)
Pt caretaker [name] aware of pt appt with Dr.Smith on 5/6 @ 9am

## 2014-09-14 ENCOUNTER — Encounter: Payer: Self-pay | Admitting: Cardiology

## 2014-09-17 ENCOUNTER — Encounter: Payer: Self-pay | Admitting: Interventional Cardiology

## 2014-09-17 ENCOUNTER — Other Ambulatory Visit: Payer: Self-pay

## 2014-09-17 ENCOUNTER — Ambulatory Visit (INDEPENDENT_AMBULATORY_CARE_PROVIDER_SITE_OTHER): Payer: Medicare Other | Admitting: Interventional Cardiology

## 2014-09-17 VITALS — BP 116/60 | HR 67 | Ht 67.0 in | Wt 175.1 lb

## 2014-09-17 DIAGNOSIS — I480 Paroxysmal atrial fibrillation: Secondary | ICD-10-CM

## 2014-09-17 DIAGNOSIS — N184 Chronic kidney disease, stage 4 (severe): Secondary | ICD-10-CM

## 2014-09-17 DIAGNOSIS — Z7901 Long term (current) use of anticoagulants: Secondary | ICD-10-CM

## 2014-09-17 DIAGNOSIS — I5032 Chronic diastolic (congestive) heart failure: Secondary | ICD-10-CM

## 2014-09-17 DIAGNOSIS — I5033 Acute on chronic diastolic (congestive) heart failure: Secondary | ICD-10-CM | POA: Diagnosis not present

## 2014-09-17 DIAGNOSIS — I208 Other forms of angina pectoris: Secondary | ICD-10-CM

## 2014-09-17 DIAGNOSIS — Z950 Presence of cardiac pacemaker: Secondary | ICD-10-CM

## 2014-09-17 LAB — CBC WITH DIFFERENTIAL/PLATELET
Basophils Absolute: 0 10*3/uL (ref 0.0–0.1)
Basophils Relative: 0.4 % (ref 0.0–3.0)
EOS ABS: 0.1 10*3/uL (ref 0.0–0.7)
Eosinophils Relative: 0.8 % (ref 0.0–5.0)
HCT: 34.1 % — ABNORMAL LOW (ref 39.0–52.0)
Hemoglobin: 11.3 g/dL — ABNORMAL LOW (ref 13.0–17.0)
LYMPHS ABS: 1.2 10*3/uL (ref 0.7–4.0)
Lymphocytes Relative: 19 % (ref 12.0–46.0)
MCHC: 33.2 g/dL (ref 30.0–36.0)
MCV: 98.8 fl (ref 78.0–100.0)
MONO ABS: 0.5 10*3/uL (ref 0.1–1.0)
MONOS PCT: 8.8 % (ref 3.0–12.0)
NEUTROS ABS: 4.4 10*3/uL (ref 1.4–7.7)
Neutrophils Relative %: 71 % (ref 43.0–77.0)
Platelets: 194 10*3/uL (ref 150.0–400.0)
RBC: 3.45 Mil/uL — AB (ref 4.22–5.81)
RDW: 17.7 % — ABNORMAL HIGH (ref 11.5–15.5)
WBC: 6.1 10*3/uL (ref 4.0–10.5)

## 2014-09-17 LAB — BASIC METABOLIC PANEL
BUN: 32 mg/dL — ABNORMAL HIGH (ref 6–23)
CO2: 34 meq/L — AB (ref 19–32)
Calcium: 9.4 mg/dL (ref 8.4–10.5)
Chloride: 94 mEq/L — ABNORMAL LOW (ref 96–112)
Creatinine, Ser: 2.47 mg/dL — ABNORMAL HIGH (ref 0.40–1.50)
GFR: 31.51 mL/min — AB (ref >60.00)
Glucose, Bld: 285 mg/dL — ABNORMAL HIGH (ref 70–99)
POTASSIUM: 4.4 meq/L (ref 3.5–5.1)
SODIUM: 133 meq/L — AB (ref 135–145)

## 2014-09-17 MED ORDER — AMIODARONE HCL 200 MG PO TABS: 200.0000 mg | ORAL_TABLET | Freq: Every day | ORAL | Status: AC

## 2014-09-17 NOTE — Progress Notes (Signed)
Cardiology Office Note   Date:  09/17/2014   ID:  [name], DOB 08/05/1918, MRN [id number]  PCP:  [name], MD  Cardiologist:   [name], MD   Chief Complaint  Patient presents with  . Congestive Heart Failure      History of Present Illness: [name] is a 81 y.o. male who presents for chronic diastolic heart failure, paroxysmal atrial fibrillation, amiodarone therapy, chronic kidney disease III-IV, chronic anticoagulation therapy, and hypertension.  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 He had acute dehydration and kidney failure. He also had hematuria. He was hospitalized and rehydrated, unfortunately with an exacerbation of acute on chronic diastolic heart failure. This was ultimately corrected with reinstitution of diuresis. Eliquis therapy was temporarily held because of hematuria. Urine color normalized. It then redeveloped a blood-tinged appearance after reinstitution of Eliquis discharge. He saw Dr. Honor Loh yesterday who felt that the bleeding may continue off and on for a while.  No orthopnea, PND, or edema is noted. Weights have been stable. Since discharge she has not required metolazone. He is stable on Demadex 80 mg per day. He is still adhering to a low salt fluid restricted diet.    Past Medical History  Diagnosis Date  . Hyperlipidemia   . Spinal stenosis   . Anemia   . Carotid artery disease     a. s/p prior carotid endarterectomy.  [name] PVD (peripheral vascular disease)   . Hyponatremia     Chronic  . CVA (cerebral infarction)     a. When asked to clarify patient says he was told thumb numbness may be TIA. He does not recall formal stroke. CT head results are only available through [facility], not in [facility].  . CKD (chronic kidney disease) stage 4, GFR 15-29 ml/min   . Long term (current) use of anticoagulants   . PAF (paroxysmal atrial fibrillation)     a. Asymptomatic. On Eliquis. CHADSVASC 7.  . Sick sinus  syndrome     With DDD St. Jude PM, initially placed in 1993 by Dr. Nils Pyle. Device upgrade 10/2002 to DDD, by Dr. Rollene Fare complicated by bleeding. Subsequent gen change 2011.  [name] Hypertensive cardiovascular disease   . Chronic diastolic congestive heart failure     ECHO 05/20/12 LVEF estimated by 2D at 55-60%  . Arthritis   . Gait disorder   . Diabetic peripheral neuropathy associated with type 2 diabetes mellitus   . Diabetes mellitus     insulin dependent  . Elevated troponin I level 06/22/2014    a. 06/2014 felt due to demand ischemia.  . Chronic anticoagulation     For PAF, on Eliquis   . Urinary retention   . Pulmonary hypertension   . Foot drop, left 07/21/2014  . Asterixis 07/21/2014  . Hypertension   . Presence of permanent cardiac pacemaker   . Pneumonia     hx of pneumonia x 1     Past Surgical History  Procedure Laterality Date  . Lumbar laminectomy  1995  . Back surgery    . Total knee arthroplasty Left   . Quadriceps tendon repair    . Cataract extraction    . Carotid endarterectomy    . Yag laser application Right 12/24/7515    Procedure: YAG LASER CAPSULOTOMY OF RIGHT EYE;  Surgeon: [name], MD;  Location: [facility];  Service: Ophthalmology;  Laterality: Right;  . Tonsillectomy    . Pacemaker insertion  DDD St. Jude PM, initially placed in 1993 by Dr. Nils Pyle. Device upgrade 10/2002 to DDD, by Dr. Rollene Fare complicated by bleeding.  . Carotid endarterectomy Right 2006  . Transurethral resection of prostate    . Cardioversion N/A 06/16/2013    Procedure: CARDIOVERSION BEDSIDE;  Surgeon: [name], MD;  Location: [facility];  Service: Cardiovascular;  Laterality: N/A;  . Pacemaker generator change  12/09/2009    SJM Accent DR RF gen change by Dr [name]  . Insert / replace / remove pacemaker      2004   . Transurethral resection of prostate N/A 08/27/2014    Procedure: TRANSURETHRAL RESECTION OF THE PROSTATE WITH GYRUS INSTRUMENTS;  Surgeon: [name],  MD;  Location: [facility];  Service: Urology;  Laterality: N/A;  . Cystoscopy N/A 08/27/2014    Procedure: CYSTOSCOPY;  Surgeon: [name], MD;  Location: [facility];  Service: Urology;  Laterality: N/A;     Current Outpatient Prescriptions  Medication Sig Dispense Refill  . allopurinol (ZYLOPRIM) 100 MG tablet Take 100 mg by mouth 2 (two) times daily.     [name] amiodarone (PACERONE) 200 MG tablet Take 1 tablet (200 mg total) by mouth at bedtime.    [name] apixaban (ELIQUIS) 2.5 MG TABS tablet Take 2.5 mg by mouth 2 (two) times daily.    . ferrous sulfate 325 (65 FE) MG tablet Take 325 mg by mouth every other day.    . fexofenadine (ALLEGRA) 180 MG tablet Take 180 mg by mouth daily.    . insulin aspart (NOVOLOG FLEXPEN) 100 UNIT/ML FlexPen Inject 2-9 Units into the skin 3 (three) times daily with meals. Sliding scale If at lunch gbc is between 159 do not give any. If 299 or above give 3 units. At bedtime it is always 9 units    . insulin glargine (LANTUS) 100 UNIT/ML injection Inject 0.08 mLs (8 Units total) into the skin at bedtime. Sliding scale If at lunch gbc is between 159 do not give any. If 299 or above give 3 units. At bedtime it is always 9 unitsComment exceeds the 0 character limit 10 mL 11  . isosorbide mononitrate (IMDUR) 60 MG 24 hr tablet Take 1 tablet (60 mg total) by mouth daily. 30 tablet 5  . metolazone (ZAROXOLYN) 2.5 MG tablet Take 1 tablet (2.5 mg total) by mouth daily as needed (take 30 min prior to Demadex, if weight goes up 3 pounds from dry weigth or develops shortness of breath). 30 tablet 0  . metoprolol tartrate (LOPRESSOR) 25 MG tablet Take 1 tablet (25 mg total) by mouth daily. 30 tablet 6  . nitroGLYCERIN (NITROSTAT) 0.4 MG SL tablet Place 1 tablet (0.4 mg total) under the tongue every 5 (five) minutes as needed for chest pain. 25 tablet 3  . OVER THE COUNTER MEDICATION Take 1 packet by mouth daily.    . polyethylene glycol (MIRALAX / GLYCOLAX) packet Take 17 g by mouth 2 (two)  times daily as needed for mild constipation. For constipation    . Polyvinyl Alcohol-Povidone (REFRESH OP) Place 1 drop into both eyes 2 (two) times daily. Refresh Gel Opth    . potassium chloride SA (K-DUR,KLOR-CON) 20 MEQ tablet Take 1 tablet (20 mEq total) by mouth daily. 30 tablet 6  . Tafluprost (ZIOPTAN) 0.0015 % SOLN Place 1 drop into both eyes at bedtime. Use at night    . torsemide (DEMADEX) 20 MG tablet Take 4 tablets (80 mg total) by mouth daily. 120 tablet 3  No current facility-administered medications for this visit.    Allergies:   Statins; Aggrenox; Carvedilol; Claritin; Cymbalta; Mucinex; Phenergan; Plavix; Rapaflo; and Travatan z    Social History:  The patient  reports that he quit smoking about 56 years ago. His smoking use included Pipe and Cigarettes. He quit after 2 years of use. He has never used smokeless tobacco. He reports that he does not drink alcohol or use illicit drugs.   Family History:  The patient's family history includes Hypertension in his mother; Multiple myeloma in his father.    ROS:  Please see the history of present illness.   Otherwise, review of systems are positive for hematuria.   All other systems are reviewed and negative.    PHYSICAL EXAM: VS:  BP 116/60 mmHg  Pulse 67  Ht '5\' 7"'  (1.702 m)  Wt 175 lb 1.6 oz (79.425 kg)  BMI 27.42 kg/m2 , BMI Body mass index is 27.42 kg/(m^2). GEN: Well nourished, well developed, in no acute distress HEENT: normal Neck: no JVD, carotid bruits, or masses Cardiac: RRR; no murmurs, rubs, or gallops,no edema  Respiratory:  clear to auscultation bilaterally, normal work of breathing GI: soft, nontender, nondistended, + BS MS: no deformity or atrophy Skin: warm and dry, no rash Neuro:  Strength and sensation are intact Psych: euthymic mood, full affect   EKG:  EKG is not ordered today.    Recent Labs: 06/22/2014: Magnesium 2.4 07/16/2014: Pro B Natriuretic peptide (BNP) 460.0*; TSH 1.050 07/24/2014:  ALT 41 09/04/2014: B Natriuretic Peptide 1383.6* 09/07/2014: Hemoglobin 11.7*; Platelets 187 09/08/2014: BUN 48*; Creatinine 2.55*; Potassium 4.3; Sodium 139    Lipid Panel No results found for: CHOL, TRIG, HDL, CHOLHDL, VLDL, LDLCALC, LDLDIRECT    Wt Readings from Last 3 Encounters:  09/17/14 175 lb 1.6 oz (79.425 kg)  09/08/14 176 lb 3.2 oz (79.924 kg)  09/01/14 176 lb 12.8 oz (80.196 kg)      Other studies Reviewed: Additional studies/ records that were reviewed today include:   ASSESSMENT AND PLAN:  Paroxysmal atrial fibrillation, DCCV 06/16/13: No recent recurrence  Chronic diastolic heart failure: No evidence of volume overload  Acute on chronic diastolic heart failure: Resolved  CKD (chronic kidney disease) stage 4, GFR 15-29 ml/min: Improved function based on last laboratory data. Be met needs to be repeated today.  Chronic anticoagulation: Hematuria status post recent TUR.  Pacemaker - St Jude     Current medicines are reviewed at length with the patient today.  The patient does not have concerns regarding medicines.  The following changes have been made:  no change  Labs/ tests ordered today include:   Orders Placed This Encounter  Procedures  . Basic metabolic panel  . CBC w/Diff     Disposition:   FU with HS in 7-10 days  Signed, [name], MD  09/17/2014 10:39 AM    [address], [place], [place]  [address] Phone: [phone]; Fax: [phone]

## 2014-09-17 NOTE — Patient Instructions (Signed)
Medication Instructions:  Your physician recommends that you continue on your current medications as directed. Please refer to the Current Medication list given to you today.   Labwork: Bmet, Cbc  Testing/Procedures: None   Follow-Up: Appt 5/19 @ 3pm  Any Other Special Instructions Will Be Listed Below (If Applicable).

## 2014-09-20 ENCOUNTER — Encounter: Payer: Self-pay | Admitting: Internal Medicine

## 2014-09-21 ENCOUNTER — Telehealth: Payer: Self-pay

## 2014-09-21 NOTE — Telephone Encounter (Signed)
-----   Message from Belva Crome, MD sent at 09/20/2014  7:21 PM EDT ----- Let Dr. B know that all labs are back to near baseline. Hemoglobin is stable, kidney function is stable and potassium is 4.4

## 2014-09-21 NOTE — Telephone Encounter (Signed)
Pt aware of lab results.  Let Dr. B know that all labs are back to near baseline. Hemoglobin is stable, kidney function is stable and potassium is 4.4    Pt reports that his weight this morning was 178.8lb. He took a metolazone 2.5mg  as instructed by Dr.Smith for weights 178lb of > Adv pt that I would update Dr.Smith. Pt verbalized understanding.

## 2014-09-28 ENCOUNTER — Telehealth: Payer: Self-pay | Admitting: Interventional Cardiology

## 2014-09-28 ENCOUNTER — Encounter: Payer: Self-pay | Admitting: Cardiology

## 2014-09-28 NOTE — Telephone Encounter (Signed)
New Message        Physical Therapist calling to notify [name] that they are going to d/c pt's home health physical therapy due to pt choosing not to stay homebound and pt deciding to go back to work. Call back if you have any questions.

## 2014-09-28 NOTE — Telephone Encounter (Signed)
Noted. FYI fwd to Dr.Smith

## 2014-09-30 ENCOUNTER — Encounter: Payer: Self-pay | Admitting: Interventional Cardiology

## 2014-09-30 ENCOUNTER — Ambulatory Visit (INDEPENDENT_AMBULATORY_CARE_PROVIDER_SITE_OTHER): Payer: Medicare Other | Admitting: Interventional Cardiology

## 2014-09-30 VITALS — BP 120/58 | HR 60 | Ht 67.0 in | Wt 177.2 lb

## 2014-09-30 DIAGNOSIS — Z7901 Long term (current) use of anticoagulants: Secondary | ICD-10-CM

## 2014-09-30 DIAGNOSIS — I480 Paroxysmal atrial fibrillation: Secondary | ICD-10-CM

## 2014-09-30 DIAGNOSIS — I5032 Chronic diastolic (congestive) heart failure: Secondary | ICD-10-CM

## 2014-09-30 DIAGNOSIS — N184 Chronic kidney disease, stage 4 (severe): Secondary | ICD-10-CM | POA: Diagnosis not present

## 2014-09-30 DIAGNOSIS — I208 Other forms of angina pectoris: Secondary | ICD-10-CM | POA: Diagnosis not present

## 2014-09-30 DIAGNOSIS — Z79899 Other long term (current) drug therapy: Secondary | ICD-10-CM | POA: Diagnosis not present

## 2014-09-30 NOTE — Progress Notes (Signed)
Cardiology Office Note   Date:  09/30/2014   ID:  [name], DOB 01-02-1919, MRN [id number]  PCP:  [name], MD  Cardiologist:   [name], MD   Chief Complaint  Patient presents with  . CHRONIC DIASTOLIC HEART FAILURE      History of Present Illness: [name] is a 81 y.o. male who presents for chronic diastolic heart failure, chronic kidney disease stage III-IV, hypertension, paroxysmal atrial fibrillation, chronic anticoagulation, diabetes mellitus, prior embolic CVA.  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 His weight is been stable right around 175 pounds at home. He has not had syncope. He denies palpitations. There is no peripheral edema. His appetite is increased. His last hospitalization led to a decision to retire from work. He quickly reversed that decision after getting home and is now back working at the [employer]. He has not had difficulty at work.    Past Medical History  Diagnosis Date  . Hyperlipidemia   . Spinal stenosis   . Anemia   . Carotid artery disease     a. s/p prior carotid endarterectomy.  [name] PVD (peripheral vascular disease)   . Hyponatremia     Chronic  . CVA (cerebral infarction)     a. When asked to clarify patient says he was told thumb numbness may be TIA. He does not recall formal stroke. CT head results are only available through [facility], not in [facility].  . CKD (chronic kidney disease) stage 4, GFR 15-29 ml/min   . Long term (current) use of anticoagulants   . PAF (paroxysmal atrial fibrillation)     a. Asymptomatic. On Eliquis. CHADSVASC 7.  . Sick sinus syndrome     With DDD St. Jude PM, initially placed in 1993 by Dr. Nils [name]. Device upgrade 10/2002 to DDD, by Dr. Rollene [name] complicated by bleeding. Subsequent gen change 2011.  [name] Hypertensive cardiovascular disease   . Chronic diastolic congestive heart failure     ECHO 05/20/12 LVEF estimated by 2D at 55-60%  . Arthritis   . Gait disorder   . Diabetic peripheral  neuropathy associated with type 2 diabetes mellitus   . Diabetes mellitus     insulin dependent  . Elevated troponin I level 06/22/2014    a. 06/2014 felt due to demand ischemia.  . Chronic anticoagulation     For PAF, on Eliquis   . Urinary retention   . Pulmonary hypertension   . Foot drop, left 07/21/2014  . Asterixis 07/21/2014  . Hypertension   . Presence of permanent cardiac pacemaker   . Pneumonia     hx of pneumonia x 1     Past Surgical History  Procedure Laterality Date  . Lumbar laminectomy  1995  . Back surgery    . Total knee arthroplasty Left   . Quadriceps tendon repair    . Cataract extraction    . Carotid endarterectomy    . Yag laser application Right 06/03/9756    Procedure: YAG LASER CAPSULOTOMY OF RIGHT EYE;  Surgeon: [name]., MD;  Location: [facility];  Service: Ophthalmology;  Laterality: Right;  . Tonsillectomy    . Pacemaker insertion      DDD St. Jude PM, initially placed in 1993 by Dr. Nils [name]. Device upgrade 10/2002 to DDD, by Dr. Rollene [name] complicated by bleeding.  . Carotid endarterectomy Right 2006  . Transurethral resection of prostate    . Cardioversion N/A 06/16/2013    Procedure: CARDIOVERSION BEDSIDE;  Surgeon: [name], MD;  Location: [facility];  Service: Cardiovascular;  Laterality: N/A;  . Pacemaker generator change  12/09/2009    SJM Accent DR RF gen change by Dr [name]  . Insert / replace / remove pacemaker      2004   . Transurethral resection of prostate N/A 08/27/2014    Procedure: TRANSURETHRAL RESECTION OF THE PROSTATE WITH GYRUS INSTRUMENTS;  Surgeon: Darrell Bandy, MD;  [facility];  Service: Urology;  Laterality: N/A;  . Cystoscopy N/A 08/27/2014    Procedure: CYSTOSCOPY;  Surgeon: Darrell Bandy, MD;  [facility];  Service: Urology;  Laterality: N/A;     Current Outpatient Prescriptions  Medication Sig Dispense Refill  . allopurinol (ZYLOPRIM) 100 MG tablet Take 100 mg by mouth 2 (two) times daily.     [name] amiodarone  (PACERONE) 200 MG tablet Take 1 tablet (200 mg total) by mouth at bedtime. 30 tablet 6  . apixaban (ELIQUIS) 2.5 MG TABS tablet Take 2.5 mg by mouth 2 (two) times daily.    . ferrous sulfate 325 (65 FE) MG tablet Take 325 mg by mouth every other day.    . fexofenadine (ALLEGRA) 180 MG tablet Take 180 mg by mouth daily.    . insulin aspart (NOVOLOG FLEXPEN) 100 UNIT/ML FlexPen Inject 2-9 Units into the skin 3 (three) times daily with meals. Sliding scale If at lunch gbc is between 159 do not give any. If 299 or above give 3 units. At bedtime it is always 9 units    . insulin glargine (LANTUS) 100 UNIT/ML injection Inject 0.08 mLs (8 Units total) into the skin at bedtime. Sliding scale If at lunch gbc is between 159 do not give any. If 299 or above give 3 units. At bedtime it is always 9 unitsComment exceeds the 0 character limit 10 mL 11  . isosorbide mononitrate (IMDUR) 60 MG 24 hr tablet Take 1 tablet (60 mg total) by mouth daily. 30 tablet 5  . metolazone (ZAROXOLYN) 2.5 MG tablet Take 1 tablet (2.5 mg total) by mouth daily as needed (take 30 min prior to Demadex, if weight goes up 3 pounds from dry weigth or develops shortness of breath). 30 tablet 0  . metoprolol tartrate (LOPRESSOR) 25 MG tablet Take 1 tablet (25 mg total) by mouth daily. 30 tablet 6  . nitroGLYCERIN (NITROSTAT) 0.4 MG SL tablet Place 1 tablet (0.4 mg total) under the tongue every 5 (five) minutes as needed for chest pain. 25 tablet 3  . OVER THE COUNTER MEDICATION Take 1 packet by mouth daily.    . polyethylene glycol (MIRALAX / GLYCOLAX) packet Take 17 g by mouth 2 (two) times daily as needed for mild constipation. For constipation    . Polyvinyl Alcohol-Povidone (REFRESH OP) Place 1 drop into both eyes 2 (two) times daily. Refresh Gel Opth    . potassium chloride SA (K-DUR,KLOR-CON) 20 MEQ tablet Take 1 tablet (20 mEq total) by mouth daily. 30 tablet 6  . torsemide (DEMADEX) 20 MG tablet Take 4 tablets (80 mg total) by mouth  daily. 120 tablet 3   No current facility-administered medications for this visit.    Allergies:   Statins; Aggrenox; Carvedilol; Claritin; Cymbalta; Mucinex; Phenergan; Plavix; Rapaflo; and Travatan z    Social History:  The patient  reports that he quit smoking about 56 years ago. His smoking use included Pipe and Cigarettes. He quit after 2 years of use. He has never used smokeless tobacco. He reports  that he does not drink alcohol or use illicit drugs.   Family History:  The patient's family history includes COPD in his sister; Healthy in his sister; Heart failure in his sister; Hypertension in his mother; Multiple myeloma in his father.    ROS:  Please see the history of present illness.   Otherwise, review of systems are positive for after chronic persistence for about 2 weeks, hematuria has resolved..   All other systems are reviewed and negative.    PHYSICAL EXAM: VS:  BP 120/58 mmHg  Pulse 60  Ht '5\' 7"'  (1.702 m)  Wt 177 lb 3.2 oz (80.377 kg)  BMI 27.75 kg/m2  SpO2 98% , BMI Body mass index is 27.75 kg/(m^2). GEN: Well nourished, well developed, in no acute distress HEENT: normal Neck: no JVD, carotid bruits, or masses Cardiac: RRR; no murmurs, rubs, or gallops,no edema . Heart sounds are somewhat distant Respiratory:  clear to auscultation bilaterally, normal work of breathing GI: soft, nontender, nondistended, + BS MS: no deformity or atrophy Skin: warm and dry, no rash Neuro:  Strength and sensation are intact Psych: euthymic mood, full affect   EKG:  EKG is not ordered today.    Recent Labs: 06/22/2014: Magnesium 2.4 07/16/2014: Pro B Natriuretic peptide (BNP) 460.0*; TSH 1.050 07/24/2014: ALT 41 09/04/2014: B Natriuretic Peptide 1383.6* 09/17/2014: BUN 32*; Creatinine 2.47*; Hemoglobin 11.3*; Platelets 194.0; Potassium 4.4; Sodium 133*    Lipid Panel No results found for: CHOL, TRIG, HDL, CHOLHDL, VLDL, LDLCALC, LDLDIRECT    Wt Readings from Last 3 Encounters:    09/30/14 177 lb 3.2 oz (80.377 kg)  09/17/14 175 lb 1.6 oz (79.425 kg)  09/08/14 176 lb 3.2 oz (79.924 kg)      Other studies Reviewed: No records reviewed.   ASSESSMENT AND PLAN:  Chronic diastolic heart failure: No evidence of volume overload  CKD (chronic kidney disease) stage 4, GFR 15-29 ml/min: Last renal function assessment stable in the 30 mL/m GFR range  Long term current use of amiodarone: No obvious toxicity  Chronic anticoagulation: Hematuria has resolved.  Paroxysmal atrial fibrillation, DCCV 06/16/13: No clinical recurrence     Current medicines are reviewed at length with the patient today.  The patient does not have concerns regarding medicines.  The following changes have been made:  no change  Labs/ tests ordered today include:  No orders of the defined types were placed in this encounter.   A basic metabolic panel will be performed in 2 weeks.  Disposition:   FU with HS in 2 weeks  Signed, [name], MD  09/30/2014 3:51 PM    [address], [place], [place]  [address] Phone: [phone]; Fax: [phone]

## 2014-09-30 NOTE — Patient Instructions (Signed)
Medication Instructions:  Your physician recommends that you continue on your current medications as directed. Please refer to the Current Medication list given to you today.   Labwork: Your physician recommends that you return for lab work on 10/14/14   Testing/Procedures: None   Follow-Up:you have a follow up appointment scheduled on 10/14/14 @ 9am  Any Other Special Instructions Will Be Listed Below (If Applicable).

## 2014-10-04 ENCOUNTER — Observation Stay (HOSPITAL_COMMUNITY)
Admission: EM | Admit: 2014-10-04 | Discharge: 2014-10-05 | Disposition: A | Discharge status: Home / Self Care | Payer: Medicare Other | Attending: Internal Medicine | Admitting: Internal Medicine

## 2014-10-04 ENCOUNTER — Emergency Department (HOSPITAL_COMMUNITY): Payer: Medicare Other

## 2014-10-04 ENCOUNTER — Encounter (HOSPITAL_COMMUNITY): Payer: Self-pay | Admitting: Emergency Medicine

## 2014-10-04 DIAGNOSIS — E860 Dehydration: Secondary | ICD-10-CM

## 2014-10-04 DIAGNOSIS — Z886 Allergy status to analgesic agent status: Secondary | ICD-10-CM | POA: Diagnosis not present

## 2014-10-04 DIAGNOSIS — I509 Heart failure, unspecified: Secondary | ICD-10-CM

## 2014-10-04 DIAGNOSIS — Z8673 Personal history of transient ischemic attack (TIA), and cerebral infarction without residual deficits: Secondary | ICD-10-CM | POA: Insufficient documentation

## 2014-10-04 DIAGNOSIS — E1142 Type 2 diabetes mellitus with diabetic polyneuropathy: Secondary | ICD-10-CM | POA: Insufficient documentation

## 2014-10-04 DIAGNOSIS — J9601 Acute respiratory failure with hypoxia: Secondary | ICD-10-CM | POA: Insufficient documentation

## 2014-10-04 DIAGNOSIS — Z950 Presence of cardiac pacemaker: Secondary | ICD-10-CM | POA: Insufficient documentation

## 2014-10-04 DIAGNOSIS — I482 Chronic atrial fibrillation: Secondary | ICD-10-CM | POA: Diagnosis not present

## 2014-10-04 DIAGNOSIS — I739 Peripheral vascular disease, unspecified: Secondary | ICD-10-CM | POA: Insufficient documentation

## 2014-10-04 DIAGNOSIS — I272 Other secondary pulmonary hypertension: Secondary | ICD-10-CM | POA: Insufficient documentation

## 2014-10-04 DIAGNOSIS — E876 Hypokalemia: Secondary | ICD-10-CM | POA: Insufficient documentation

## 2014-10-04 DIAGNOSIS — D509 Iron deficiency anemia, unspecified: Secondary | ICD-10-CM | POA: Diagnosis not present

## 2014-10-04 DIAGNOSIS — I130 Hypertensive heart and chronic kidney disease with heart failure and stage 1 through stage 4 chronic kidney disease, or unspecified chronic kidney disease: Secondary | ICD-10-CM | POA: Diagnosis not present

## 2014-10-04 DIAGNOSIS — E1140 Type 2 diabetes mellitus with diabetic neuropathy, unspecified: Secondary | ICD-10-CM | POA: Diagnosis not present

## 2014-10-04 DIAGNOSIS — E1165 Type 2 diabetes mellitus with hyperglycemia: Secondary | ICD-10-CM | POA: Insufficient documentation

## 2014-10-04 DIAGNOSIS — Z794 Long term (current) use of insulin: Secondary | ICD-10-CM | POA: Diagnosis not present

## 2014-10-04 DIAGNOSIS — I5032 Chronic diastolic (congestive) heart failure: Secondary | ICD-10-CM | POA: Diagnosis not present

## 2014-10-04 DIAGNOSIS — N184 Chronic kidney disease, stage 4 (severe): Secondary | ICD-10-CM | POA: Diagnosis present

## 2014-10-04 DIAGNOSIS — I5043 Acute on chronic combined systolic (congestive) and diastolic (congestive) heart failure: Secondary | ICD-10-CM | POA: Insufficient documentation

## 2014-10-04 DIAGNOSIS — Z7901 Long term (current) use of anticoagulants: Secondary | ICD-10-CM | POA: Insufficient documentation

## 2014-10-04 DIAGNOSIS — E785 Hyperlipidemia, unspecified: Secondary | ICD-10-CM | POA: Insufficient documentation

## 2014-10-04 DIAGNOSIS — R42 Dizziness and giddiness: Secondary | ICD-10-CM | POA: Diagnosis not present

## 2014-10-04 DIAGNOSIS — Z888 Allergy status to other drugs, medicaments and biological substances status: Secondary | ICD-10-CM | POA: Diagnosis not present

## 2014-10-04 DIAGNOSIS — M109 Gout, unspecified: Secondary | ICD-10-CM | POA: Diagnosis not present

## 2014-10-04 DIAGNOSIS — N289 Disorder of kidney and ureter, unspecified: Secondary | ICD-10-CM

## 2014-10-04 LAB — COMPREHENSIVE METABOLIC PANEL
ALK PHOS: 59 U/L (ref 38–126)
ALT: 36 U/L (ref 17–63)
ANION GAP: 12 (ref 5–15)
AST: 54 U/L — ABNORMAL HIGH (ref 15–41)
Albumin: 3.1 g/dL — ABNORMAL LOW (ref 3.5–5.0)
BUN: 48 mg/dL — ABNORMAL HIGH (ref 6–20)
CO2: 36 mmol/L — ABNORMAL HIGH (ref 22–32)
CREATININE: 2.62 mg/dL — AB (ref 0.61–1.24)
Calcium: 9.3 mg/dL (ref 8.9–10.3)
Chloride: 87 mmol/L — ABNORMAL LOW (ref 101–111)
GFR calc non Af Amer: 19 mL/min — ABNORMAL LOW (ref >60)
GFR, EST AFRICAN AMERICAN: 22 mL/min — AB (ref >60)
GLUCOSE: 301 mg/dL — AB (ref 65–99)
Potassium: 3.1 mmol/L — ABNORMAL LOW (ref 3.5–5.1)
Sodium: 135 mmol/L (ref 135–145)
Total Bilirubin: 0.7 mg/dL (ref 0.3–1.2)
Total Protein: 6.4 g/dL — ABNORMAL LOW (ref 6.5–8.1)

## 2014-10-04 LAB — CBC WITH DIFFERENTIAL/PLATELET
Basophils Absolute: 0 10*3/uL (ref 0.0–0.1)
Basophils Relative: 0 % (ref 0–1)
Eosinophils Absolute: 0.1 10*3/uL (ref 0.0–0.7)
Eosinophils Relative: 1 % (ref 0–5)
HEMATOCRIT: 35.1 % — AB (ref 39.0–52.0)
Hemoglobin: 11.4 g/dL — ABNORMAL LOW (ref 13.0–17.0)
LYMPHS PCT: 23 % (ref 12–46)
Lymphs Abs: 1.4 10*3/uL (ref 0.7–4.0)
MCH: 32.3 pg (ref 26.0–34.0)
MCHC: 32.5 g/dL (ref 30.0–36.0)
MCV: 99.4 fL (ref 78.0–100.0)
Monocytes Absolute: 0.5 10*3/uL (ref 0.1–1.0)
Monocytes Relative: 8 % (ref 3–12)
Neutro Abs: 4 10*3/uL (ref 1.7–7.7)
Neutrophils Relative %: 68 % (ref 43–77)
Platelets: 174 10*3/uL (ref 150–400)
RBC: 3.53 MIL/uL — ABNORMAL LOW (ref 4.22–5.81)
RDW: 16.4 % — AB (ref 11.5–15.5)
WBC: 6 10*3/uL (ref 4.0–10.5)

## 2014-10-04 LAB — I-STAT TROPONIN, ED: Troponin i, poc: 0.26 ng/mL (ref 0.00–0.08)

## 2014-10-04 LAB — GLUCOSE, CAPILLARY
GLUCOSE-CAPILLARY: 167 mg/dL — AB (ref 65–99)
GLUCOSE-CAPILLARY: 208 mg/dL — AB (ref 65–99)

## 2014-10-04 LAB — CBG MONITORING, ED: Glucose-Capillary: 240 mg/dL — ABNORMAL HIGH (ref 65–99)

## 2014-10-04 LAB — BRAIN NATRIURETIC PEPTIDE: B Natriuretic Peptide: 467.9 pg/mL — ABNORMAL HIGH (ref 0.0–100.0)

## 2014-10-04 MED ORDER — MECLIZINE HCL 25 MG PO TABS
12.5000 mg | ORAL_TABLET | Freq: Once | ORAL | Status: AC
  Administered 2014-10-04: 12.5 mg via ORAL
  Filled 2014-10-04: qty 1

## 2014-10-04 MED ORDER — NITROGLYCERIN 0.4 MG SL SUBL: 0.4000 mg | SUBLINGUAL_TABLET | SUBLINGUAL | Status: DC | PRN

## 2014-10-04 MED ORDER — TORSEMIDE 20 MG PO TABS
80.0000 mg | ORAL_TABLET | Freq: Every day | ORAL | Status: DC
  Administered 2014-10-05: 80 mg via ORAL
  Filled 2014-10-04: qty 4

## 2014-10-04 MED ORDER — AMIODARONE HCL 200 MG PO TABS
200.0000 mg | ORAL_TABLET | Freq: Every day | ORAL | Status: DC
  Administered 2014-10-04: 200 mg via ORAL
  Filled 2014-10-04 (×2): qty 1

## 2014-10-04 MED ORDER — ALLOPURINOL 100 MG PO TABS
100.0000 mg | ORAL_TABLET | Freq: Two times a day (BID) | ORAL | Status: DC
  Administered 2014-10-04 – 2014-10-05 (×2): 100 mg via ORAL
  Filled 2014-10-04 (×3): qty 1

## 2014-10-04 MED ORDER — METOLAZONE 2.5 MG PO TABS
2.5000 mg | ORAL_TABLET | Freq: Every day | ORAL | Status: DC | PRN
  Filled 2014-10-04: qty 1

## 2014-10-04 MED ORDER — POTASSIUM CHLORIDE CRYS ER 20 MEQ PO TBCR
20.0000 meq | EXTENDED_RELEASE_TABLET | Freq: Every day | ORAL | Status: DC
  Administered 2014-10-05: 20 meq via ORAL
  Filled 2014-10-04: qty 1

## 2014-10-04 MED ORDER — INSULIN GLARGINE 100 UNIT/ML ~~LOC~~ SOLN
10.0000 [IU] | Freq: Every day | SUBCUTANEOUS | Status: DC
  Administered 2014-10-04: 10 [IU] via SUBCUTANEOUS
  Filled 2014-10-04 (×2): qty 0.1

## 2014-10-04 MED ORDER — POTASSIUM CHLORIDE CRYS ER 20 MEQ PO TBCR
40.0000 meq | EXTENDED_RELEASE_TABLET | Freq: Once | ORAL | Status: AC
  Administered 2014-10-04: 40 meq via ORAL
  Filled 2014-10-04: qty 2

## 2014-10-04 MED ORDER — SODIUM CHLORIDE 0.9 % IJ SOLN
3.0000 mL | Freq: Two times a day (BID) | INTRAMUSCULAR | Status: DC
Start: 2014-10-04 — End: 2014-10-05
  Administered 2014-10-04 – 2014-10-05 (×2): 3 mL via INTRAVENOUS

## 2014-10-04 MED ORDER — ISOSORBIDE MONONITRATE ER 60 MG PO TB24
60.0000 mg | ORAL_TABLET | Freq: Every day | ORAL | Status: DC
  Administered 2014-10-05: 60 mg via ORAL
  Filled 2014-10-04: qty 1

## 2014-10-04 MED ORDER — INSULIN GLARGINE 100 UNITS/ML SOLOSTAR PEN
10.0000 [IU] | PEN_INJECTOR | Freq: Every day | SUBCUTANEOUS | Status: DC
  Filled 2014-10-04: qty 3

## 2014-10-04 MED ORDER — POLYETHYLENE GLYCOL 3350 17 G PO PACK
17.0000 g | PACK | Freq: Two times a day (BID) | ORAL | Status: DC
  Administered 2014-10-04 – 2014-10-05 (×2): 17 g via ORAL
  Filled 2014-10-04 (×3): qty 1

## 2014-10-04 MED ORDER — APIXABAN 2.5 MG PO TABS
2.5000 mg | ORAL_TABLET | Freq: Two times a day (BID) | ORAL | Status: DC
  Administered 2014-10-04 – 2014-10-05 (×2): 2.5 mg via ORAL
  Filled 2014-10-04 (×3): qty 1

## 2014-10-04 MED ORDER — POLYVINYL ALCOHOL 1.4 % OP SOLN
1.0000 [drp] | Freq: Three times a day (TID) | OPHTHALMIC | Status: DC
  Filled 2014-10-04: qty 15

## 2014-10-04 MED ORDER — LATANOPROST 0.005 % OP SOLN
1.0000 [drp] | Freq: Every day | OPHTHALMIC | Status: DC
  Filled 2014-10-04: qty 2.5

## 2014-10-04 MED ORDER — SODIUM CHLORIDE 0.9 % IV BOLUS (SEPSIS)
500.0000 mL | Freq: Once | INTRAVENOUS | Status: AC
  Administered 2014-10-04: 500 mL via INTRAVENOUS

## 2014-10-04 MED ORDER — CARBOXYMETHYLCELLUL-GLYCERIN 0.5-0.9 % OP SOLN: 1.0000 [drp] | Freq: Three times a day (TID) | OPHTHALMIC | Status: DC | PRN

## 2014-10-04 MED ORDER — METOPROLOL TARTRATE 25 MG PO TABS
25.0000 mg | ORAL_TABLET | Freq: Every day | ORAL | Status: DC
  Administered 2014-10-05: 25 mg via ORAL
  Filled 2014-10-04: qty 1

## 2014-10-04 MED ORDER — CARBOXYMETHYLCELLULOSE SODIUM 1 % OP SOLN: 1.0000 [drp] | Freq: Every day | OPHTHALMIC | Status: DC

## 2014-10-04 MED ORDER — MECLIZINE HCL 12.5 MG PO TABS
12.5000 mg | ORAL_TABLET | Freq: Three times a day (TID) | ORAL | Status: DC | PRN
  Filled 2014-10-04: qty 1

## 2014-10-04 MED ORDER — FERROUS SULFATE 325 (65 FE) MG PO TABS: 325.0000 mg | ORAL_TABLET | ORAL | Status: DC

## 2014-10-04 MED ORDER — INSULIN ASPART 100 UNIT/ML ~~LOC~~ SOLN
0.0000 [IU] | Freq: Three times a day (TID) | SUBCUTANEOUS | Status: DC
  Administered 2014-10-05: 2 [IU] via SUBCUTANEOUS
  Administered 2014-10-05: 5 [IU] via SUBCUTANEOUS

## 2014-10-04 MED ORDER — INSULIN ASPART 100 UNIT/ML ~~LOC~~ SOLN
0.0000 [IU] | Freq: Every day | SUBCUTANEOUS | Status: DC
  Administered 2014-10-04: 2 [IU] via SUBCUTANEOUS

## 2014-10-04 NOTE — Progress Notes (Signed)
Called to receive report. Nurse not answering.

## 2014-10-04 NOTE — ED Notes (Signed)
Per EMS- pt experienced dizziness episode yesterday while having bowel movement. Today also had another episode on the way to the bathroom, lasted 15 minutes. Pt is normally weak on left leg. Pt a/o x4.

## 2014-10-04 NOTE — ED Notes (Signed)
Lab results of I-Stat troponin was 0.26 reported to Dr.Yao.

## 2014-10-04 NOTE — ED Notes (Signed)
Attempted report states nurse will can right back.

## 2014-10-04 NOTE — ED Notes (Signed)
Patient transported to CT 

## 2014-10-04 NOTE — ED Provider Notes (Signed)
CSN: [id number]     Arrival date & time 10/04/14  1017 History   First MD Initiated Contact with Patient 10/04/14 1021     Chief Complaint  Patient presents with  . Dizziness     (Consider location/radiation/quality/duration/timing/severity/associated sxs/prior Treatment) The history is provided by the patient.  [name] is a 81 y.o. male history of hyperlipidemia, CAD, stroke on Eliquis here presenting with dizziness. He was dizzy yesterday while he was trying to walk to the bathroom. He states that he feels lightheaded and has some vertiginous symptoms. He then had a bowel movement and felt well. Today he was walking the bathroom and then felt room spinning for about 15 minutes. Felt weakness in both legs. He was diagnosed with vertigo years ago but is not currently on any medications for vertigo. Denies any vomiting or chest pain shortness of breath. He has chronic right thumb numbness has not changed.     Past Medical History  Diagnosis Date  . Hyperlipidemia   . Spinal stenosis   . Anemia   . Carotid artery disease     a. s/p prior carotid endarterectomy.  [name] PVD (peripheral vascular disease)   . Hyponatremia     Chronic  . CVA (cerebral infarction)     a. When asked to clarify patient says he was told thumb numbness may be TIA. He does not recall formal stroke. CT head results are only available through [facility], not in [facility].  . CKD (chronic kidney disease) stage 4, GFR 15-29 ml/min   . Long term (current) use of anticoagulants   . PAF (paroxysmal atrial fibrillation)     a. Asymptomatic. On Eliquis. CHADSVASC 7.  . Sick sinus syndrome     With DDD St. Jude PM, initially placed in 1993 by Dr. Nils Pyle. Device upgrade 10/2002 to DDD, by Dr. Rollene Fare complicated by bleeding. Subsequent gen change 2011.  [name] Hypertensive cardiovascular disease   . Chronic diastolic congestive heart failure     ECHO 05/20/12 LVEF estimated by 2D at 55-60%  . Arthritis   . Gait disorder   . Diabetic  peripheral neuropathy associated with type 2 diabetes mellitus   . Diabetes mellitus     insulin dependent  . Elevated troponin I level 06/22/2014    a. 06/2014 felt due to demand ischemia.  . Chronic anticoagulation     For PAF, on Eliquis   . Urinary retention   . Pulmonary hypertension   . Foot drop, left 07/21/2014  . Asterixis 07/21/2014  . Hypertension   . Presence of permanent cardiac pacemaker   . Pneumonia     hx of pneumonia x 1    Past Surgical History  Procedure Laterality Date  . Lumbar laminectomy  1995  . Back surgery    . Total knee arthroplasty Left   . Quadriceps tendon repair    . Cataract extraction    . Carotid endarterectomy    . Yag laser application Right 3/38/3291    Procedure: YAG LASER CAPSULOTOMY OF RIGHT EYE;  Surgeon: [name], MD;  Location: [place];  Service: Ophthalmology;  Laterality: Right;  . Tonsillectomy    . Pacemaker insertion      DDD St. Jude PM, initially placed in 1993 by Dr. Nils Pyle. Device upgrade 10/2002 to DDD, by Dr. Rollene Fare complicated by bleeding.  . Carotid endarterectomy Right 2006  . Transurethral resection of prostate    . Cardioversion N/A 06/16/2013    Procedure: CARDIOVERSION BEDSIDE;  Surgeon: [name], MD;  Location: [place];  Service: Cardiovascular;  Laterality: N/A;  . Pacemaker generator change  12/09/2009    SJM Accent DR RF gen change by Dr [name]  . Insert / replace / remove pacemaker      2004   . Transurethral resection of prostate N/A 08/27/2014    Procedure: TRANSURETHRAL RESECTION OF THE PROSTATE WITH GYRUS INSTRUMENTS;  Surgeon: [name], MD;  Location: [facility];  Service: Urology;  Laterality: N/A;  . Cystoscopy N/A 08/27/2014    Procedure: CYSTOSCOPY;  Surgeon: [name], MD;  Location: [facility];  Service: Urology;  Laterality: N/A;   Family History  Problem Relation Age of Onset  . Multiple myeloma Father   . Hypertension Mother   . Healthy Sister   . COPD Sister   . Heart failure Sister     History  Substance Use Topics  . Smoking status: Former Smoker -- 2 years    Types: Pipe, Cigarettes    Quit date: 05/14/1958  . Smokeless tobacco: Never Used  . Alcohol Use: No     Comment: Cocktails occasionally    Review of Systems  Neurological: Positive for dizziness.  All other systems reviewed and are negative.     Allergies  Statins; Aggrenox; Carvedilol; Claritin; Cymbalta; Mucinex; Phenergan; Plavix; Rapaflo; and Travatan z  Home Medications   Prior to Admission medications   Medication Sig Start Date End Date Taking? Authorizing Provider  allopurinol (ZYLOPRIM) 100 MG tablet Take 100 mg by mouth 2 (two) times daily.    Yes Historical Provider, MD  amiodarone (PACERONE) 200 MG tablet Take 1 tablet (200 mg total) by mouth at bedtime. 09/17/14  Yes Belva Crome, MD  apixaban (ELIQUIS) 2.5 MG TABS tablet Take 2.5 mg by mouth 2 (two) times daily.   Yes Historical Provider, MD  Carboxymethylcellul-Glycerin 0.5-0.9 % SOLN Place 1 drop into both eyes 3 (three) times daily as needed (dry eyes).   Yes Historical Provider, MD  carboxymethylcellulose 1 % ophthalmic solution Place 1 drop into both eyes at bedtime.   Yes Historical Provider, MD  ferrous sulfate 325 (65 FE) MG tablet Take 325 mg by mouth every other day.   Yes Historical Provider, MD  fexofenadine (ALLEGRA) 180 MG tablet Take 180 mg by mouth at bedtime.    Yes Historical Provider, MD  insulin aspart (NOVOLOG FLEXPEN) 100 UNIT/ML FlexPen Inject 0-10 Units into the skin 3 (three) times daily with meals. Per sliding scale:  CBG <175 no insulin (unless consuming large amounts of carbs; 6-10 units for CBG >175 based on actual CBG and number of carbs in meal; >325 call MD   Yes Historical Provider, MD  insulin glargine (LANTUS) 100 unit/mL SOPN Inject 10 Units into the skin at bedtime.   Yes Historical Provider, MD  isosorbide mononitrate (IMDUR) 60 MG 24 hr tablet Take 1 tablet (60 mg total) by mouth daily. 07/14/14  Yes  Belva Crome, MD  metolazone (ZAROXOLYN) 2.5 MG tablet Take 1 tablet (2.5 mg total) by mouth daily as needed (take 30 min prior to Demadex, if weight goes up 3 pounds from dry weigth or develops shortness of breath). Patient taking differently: Take 2.5 mg by mouth daily as needed (take 30 min prior to Demadex, if weight is 178 or over).  09/08/14  Yes Debbe Odea, MD  metoprolol tartrate (LOPRESSOR) 25 MG tablet Take 1 tablet (25 mg total) by mouth daily. 06/21/14  Yes Belva Crome, MD  nitroGLYCERIN (NITROSTAT) 0.4 MG SL tablet Place 1 tablet (0.4 mg total) under the tongue every 5 (five) minutes as needed for chest pain. 12/03/13  Yes Belva Crome, MD  OVER THE COUNTER MEDICATION Take 1 packet by mouth daily. Nature Made Diabetic Pak (6 tablet combo)   Yes Historical Provider, MD  polyethylene glycol (MIRALAX / GLYCOLAX) packet Take 17 g by mouth 2 (two) times daily.    Yes Historical Provider, MD  potassium chloride SA (K-DUR,KLOR-CON) 20 MEQ tablet Take 1 tablet (20 mEq total) by mouth daily. 09/08/14  Yes Debbe Odea, MD  Tafluprost 0.0015 % SOLN Place 1 drop into both eyes at bedtime. ZIOPTAN   Yes Historical Provider, MD  torsemide (DEMADEX) 20 MG tablet Take 4 tablets (80 mg total) by mouth daily. 07/03/14  Yes Eileen Stanford, PA-C  insulin glargine (LANTUS) 100 UNIT/ML injection Inject 0.08 mLs (8 Units total) into the skin at bedtime. Sliding scale If at lunch gbc is between 159 do not give any. If 299 or above give 3 units. At bedtime it is always 9 unitsComment exceeds the 0 character limit Patient not taking: Reported on 10/04/2014 09/08/14   Debbe Odea, MD   BP 118/65 mmHg  Pulse 82  Temp(Src) 97.6 F (36.4 C) (Oral)  Resp 17  SpO2 100% Physical Exam  Constitutional: He is oriented to person, place, and time. He appears well-developed and well-nourished.  Chronically ill, NAD   HENT:  Head: Normocephalic.  Mouth/Throat: Oropharynx is clear and moist.  Eyes: Conjunctivae are  normal. Pupils are equal, round, and reactive to light.  No nystagmus   Neck: Normal range of motion. Neck supple.  Cardiovascular: Normal rate, regular rhythm and normal heart sounds.   Pulmonary/Chest: Effort normal and breath sounds normal. No respiratory distress. He has no wheezes. He has no rales.  Abdominal: Soft. Bowel sounds are normal. He exhibits no distension. There is no tenderness.  Musculoskeletal: Normal range of motion. He exhibits no edema or tenderness.  Neurological: He is alert and oriented to person, place, and time. No cranial nerve deficit. Coordination normal.  Nl strength throughout. Nl sensation except diminished R thumb (chronic). Nl finger to nose.   Skin: Skin is warm and dry.  Psychiatric: He has a normal mood and affect. His behavior is normal. Judgment and thought content normal.  Nursing note and vitals reviewed.   ED Course  Procedures (including critical care time) Labs Review Labs Reviewed  CBC WITH DIFFERENTIAL/PLATELET - Abnormal; Notable for the following:    RBC 3.53 (*)    Hemoglobin 11.4 (*)    HCT 35.1 (*)    RDW 16.4 (*)    All other components within normal limits  COMPREHENSIVE METABOLIC PANEL - Abnormal; Notable for the following:    Potassium 3.1 (*)    Chloride 87 (*)    CO2 36 (*)    Glucose, Bld 301 (*)    BUN 48 (*)    Creatinine, Ser 2.62 (*)    Total Protein 6.4 (*)    Albumin 3.1 (*)    AST 54 (*)    GFR calc non Af Amer 19 (*)    GFR calc Af Amer 22 (*)    All other components within normal limits  BRAIN NATRIURETIC PEPTIDE - Abnormal; Notable for the following:    B Natriuretic Peptide 467.9 (*)    All other components within normal limits  I-STAT TROPOININ, ED - Abnormal; Notable for the following:  Troponin i, poc 0.26 (*)    All other components within normal limits  CBG MONITORING, ED - Abnormal; Notable for the following:    Glucose-Capillary 240 (*)    All other components within normal limits    Imaging  Review Dg Chest 2 View  10/04/2014   CLINICAL DATA:  Dizziness  EXAM: CHEST  2 VIEW  COMPARISON:  September 04, 2014  FINDINGS: There is stable cardiomegaly. The pulmonary vascularity is normal. There is no appreciable edema or consolidation. Pacemaker leads are attached to the right atrium and right ventricle. No adenopathy. There is mild degenerative change in the thoracic spine and both shoulders.  IMPRESSION: Cardiomegaly.  No edema or consolidation.   Electronically Signed   By: Lowella Grip III M.D.   On: 10/04/2014 13:52   Ct Head Wo Contrast  10/04/2014   CLINICAL DATA:  Vertigo for several days  EXAM: CT HEAD WITHOUT CONTRAST  TECHNIQUE: Contiguous axial images were obtained from the base of the skull through the vertex without intravenous contrast.  COMPARISON:  March 10, 2012  FINDINGS: Moderate diffuse atrophy is stable. There is no intracranial mass, hemorrhage, extra-axial fluid collection, or midline shift. There is mild small vessel disease in the centra semiovale bilaterally, slightly more on the left than on the right, stable. No new gray-white compartment lesion identified. No acute infarct apparent. Basal ganglia calcification is felt to be physiologic in this age group. The bony calvarium appears intact. The mastoid air cells clear.  IMPRESSION: Atrophy with periventricular small vessel disease, stable. No intracranial mass, hemorrhage, or acute appearing infarct. Mastoid air cells are clear.   Electronically Signed   By: Lowella Grip III M.D.   On: 10/04/2014 13:44     EKG Interpretation   Date/Time:  Monday Oct 04 2014 10:18:18 EDT Ventricular Rate:  60 PR Interval:  192 QRS Duration: 65 QT Interval:  174 QTC Calculation: 174 R Axis:   -156 Text Interpretation:  paced rhythm Ventricular bigeminy Abnormal R-wave  progression, early transition Inferior infarct, age indeterminate Lateral  leads are also involved Lead(s) V3 were not used for morphology analysis  No  significant change since last tracing Confirmed by Shannon Kirkendall  MD, Alice Vitelli  ([id number]) on 10/04/2014 10:24:21 AM      MDM   Final diagnoses:  Dizziness    [name] is a 81 y.o. male here with dizziness, vertigo. Likely BPPV. Neuro exam unremarkable. Will get CT head but he is not showing signs of stroke currently. If neg, he is already on eliquis and getting MRI will likely not change management. Will give meclizine for symptomatic relief.   2:06 PM CT head unremarkable. Patient's Cr now 2.6, elevated from previous. Of note, he was admitted a month ago for same complaint and was hydrated in the hospital and then went into CHF. His zaroxolyn was dc. He saw [name] several days ago and restarted zaroxolyn. I think likely mild dehydration from diuresis. Also troponin mildly elevated but no chest pain. Given IVF, will admit to tele.     Wandra Arthurs, MD 10/04/14 [phone]

## 2014-10-04 NOTE — H&P (Signed)
[facility] History and Physical  [name] ZJI:[id number] DOB: [birth year] DOA: 10/04/2014  Referring physician:  Shirlyn Goltz PCP:  [name], MD   Chief Complaint:  Dizziness  HPI:  The patient is a 81 y.o. year-old male with history of chronic diastolic heart failure followed closely by Dr. Pernell Dupre, paroxysmal atrial fibrillation on amiodarone, see Katie stage III/4, hypertension, chronic anticoagulation who presents with room spinning sensation.  The patient was last at their baseline health yesterday.  He has very tenuous heart failure and if he gains or loses a pound T the becomes dehydrated with acute kidney injury or he becomes short of breath. He is seen by [name] weekly for evaluations. He has had some symptoms of lightheadedness associated with dehydration and acute kidney injury. Yesterday he had some lightheadedness and some mild weakness and he felt like he may be dehydrated or have some low potassium. He states that this morning, he weighed himself and he had gained several pounds and he took an extra dose of metolazone. He subsequently had the sensation of room spinning when he stood up. He stated it was like being on a merry-go-round. He denied feeling flushed, nauseated, or presyncopal. His wife helped him lie down on the floor and they waited for EMS to arrive. He denied chest pains, palpitations, shortness of breath. He has not had any recent symptoms of infection such as fevers, chills, cough, dysuria.  He denies focal numbness, tingling, weakness, slurred speech or confusion.  In the emergency department, his vital signs were stable. His labs were notable for chronic mild anemia, mild hypokalemia, creatinine at baseline of 2.62, mild hyperglycemia 301. His chest x-ray was stable and his head CT showed no acute changes. He was seen by [name] in the emergency department who determined that he was near euvolemic despite the fact that he recently gotten  a 500 cc bolus by the emergency department physician. He stated that we should not change his diuretics nor should we give him IV fluids. He is being admitted for room spinning sensation with inability to ambulate.  Review of Systems:  General:  Denies fevers, chills, several pound weight gain HEENT:  Denies changes to hearing and vision, rhinorrhea, sinus congestion, sore throat CV:  Denies chest pain and palpitations, lower extremity edema.  PULM:  Denies SOB, wheezing, cough.   GI:  Denies nausea, vomiting, constipation, diarrhea.   GU:  Denies dysuria, frequency, urgency ENDO:  Denies polyuria, polydipsia.   HEME:  Denies hematemesis, blood in stools, melena, abnormal bruising or bleeding.  LYMPH:  Denies lymphadenopathy.   MSK:  Denies arthralgias, myalgias.   DERM:  Denies skin rash or ulcer.   NEURO:  Denies focal numbness, weakness, slurred speech, confusion, facial droop.  PSYCH:  Denies anxiety and depression.    Past Medical History  Diagnosis Date  . Hyperlipidemia   . Spinal stenosis   . Anemia   . Carotid artery disease     a. s/p prior carotid endarterectomy.  [name] PVD (peripheral vascular disease)   . Hyponatremia     Chronic  . CVA (cerebral infarction)     a. When asked to clarify patient says he was told thumb numbness may be TIA. He does not recall formal stroke. CT head results are only available through [facility], not in [facility].  . CKD (chronic kidney disease) stage 4, GFR 15-29 ml/min   . Long term (current) use of anticoagulants   . PAF (paroxysmal  atrial fibrillation)     a. Asymptomatic. On Eliquis. CHADSVASC 7.  . Sick sinus syndrome     With DDD St. Jude PM, initially placed in 1993 by Dr. Nils Pyle. Device upgrade 10/2002 to DDD, by Dr. Rollene Fare complicated by bleeding. Subsequent gen change 2011.  [name] Hypertensive cardiovascular disease   . Chronic diastolic congestive heart failure     ECHO 05/20/12 LVEF estimated by 2D at 55-60%  . Arthritis   . Gait disorder    . Diabetic peripheral neuropathy associated with type 2 diabetes mellitus   . Diabetes mellitus     insulin dependent  . Elevated troponin I level 06/22/2014    a. 06/2014 felt due to demand ischemia.  . Chronic anticoagulation     For PAF, on Eliquis   . Urinary retention   . Pulmonary hypertension   . Foot drop, left 07/21/2014  . Asterixis 07/21/2014  . Hypertension   . Presence of permanent cardiac pacemaker   . Pneumonia     hx of pneumonia x 1    Past Surgical History  Procedure Laterality Date  . Lumbar laminectomy  1995  . Back surgery    . Total knee arthroplasty Left   . Quadriceps tendon repair    . Cataract extraction    . Carotid endarterectomy    . Yag laser application Right 10/31/5091    Procedure: YAG LASER CAPSULOTOMY OF RIGHT EYE;  Surgeon: [name], MD;  Location: [facility];  Service: Ophthalmology;  Laterality: Right;  . Tonsillectomy    . Pacemaker insertion      DDD St. Jude PM, initially placed in 1993 by Dr. Nils Pyle. Device upgrade 10/2002 to DDD, by Dr. Rollene Fare complicated by bleeding.  . Carotid endarterectomy Right 2006  . Transurethral resection of prostate    . Cardioversion N/A 06/16/2013    Procedure: CARDIOVERSION BEDSIDE;  Surgeon: [name], MD;  Location: [facility];  Service: Cardiovascular;  Laterality: N/A;  . Pacemaker generator change  12/09/2009    SJM Accent DR RF gen change by Dr [name]  . Insert / replace / remove pacemaker      2004   . Transurethral resection of prostate N/A 08/27/2014    Procedure: TRANSURETHRAL RESECTION OF THE PROSTATE WITH GYRUS INSTRUMENTS;  Surgeon: [name], MD;  Location: [facility];  Service: Urology;  Laterality: N/A;  . Cystoscopy N/A 08/27/2014    Procedure: CYSTOSCOPY;  Surgeon: [name], MD;  Location: [facility];  Service: Urology;  Laterality: N/A;   Social History:  reports that he quit smoking about 56 years ago. His smoking use included Pipe and Cigarettes. He quit after 2 years of use. He has  never used smokeless tobacco. He reports that he does not drink alcohol or use illicit drugs. From home with his wife. He recently completed physical and occupational therapy home health services  Allergies  Allergen Reactions  . Statins Other (See Comments)    rhabdomyolisis  . Aggrenox [Aspirin-Dipyridamole Er] Other (See Comments)    Dizziness  . Carvedilol Other (See Comments)    Dizziness  . Claritin [Loratadine] Other (See Comments)    Dizziness & drowsiness   . Cymbalta [Duloxetine Hcl]     Confusion   . Mucinex [Guaifenesin Er] Other (See Comments)    Dizziness, drowsiness  . Phenergan [Promethazine Hcl] Other (See Comments)    Causes severe confusion  . Plavix [Clopidogrel Bisulfate] Other (See Comments)    Dizziness  . Rapaflo [Silodosin]  Other (See Comments)    Dizziness for about 30- 45  Minutes At time of preop appointment on 08/26/2014 patient denies any problems with this medication  . Travatan Z [Travoprost] Other (See Comments)    Eye irritation    Family History  Problem Relation Age of Onset  . Multiple myeloma Father   . Hypertension Mother   . Healthy Sister   . COPD Sister   . Heart failure Sister      Prior to Admission medications   Medication Sig Start Date End Date Taking? Authorizing Provider  allopurinol (ZYLOPRIM) 100 MG tablet Take 100 mg by mouth 2 (two) times daily.    Yes Historical Provider, MD  amiodarone (PACERONE) 200 MG tablet Take 1 tablet (200 mg total) by mouth at bedtime. 09/17/14  Yes Belva Crome, MD  apixaban (ELIQUIS) 2.5 MG TABS tablet Take 2.5 mg by mouth 2 (two) times daily.   Yes Historical Provider, MD  Carboxymethylcellul-Glycerin 0.5-0.9 % SOLN Place 1 drop into both eyes 3 (three) times daily as needed (dry eyes).   Yes Historical Provider, MD  carboxymethylcellulose 1 % ophthalmic solution Place 1 drop into both eyes at bedtime.   Yes Historical Provider, MD  ferrous sulfate 325 (65 FE) MG tablet Take 325 mg by mouth  every other day.   Yes Historical Provider, MD  fexofenadine (ALLEGRA) 180 MG tablet Take 180 mg by mouth at bedtime.    Yes Historical Provider, MD  insulin aspart (NOVOLOG FLEXPEN) 100 UNIT/ML FlexPen Inject 0-10 Units into the skin 3 (three) times daily with meals. Per sliding scale:  CBG <175 no insulin (unless consuming large amounts of carbs; 6-10 units for CBG >175 based on actual CBG and number of carbs in meal; >325 call MD   Yes Historical Provider, MD  insulin glargine (LANTUS) 100 unit/mL SOPN Inject 10 Units into the skin at bedtime.   Yes Historical Provider, MD  isosorbide mononitrate (IMDUR) 60 MG 24 hr tablet Take 1 tablet (60 mg total) by mouth daily. 07/14/14  Yes Belva Crome, MD  metolazone (ZAROXOLYN) 2.5 MG tablet Take 1 tablet (2.5 mg total) by mouth daily as needed (take 30 min prior to Demadex, if weight goes up 3 pounds from dry weigth or develops shortness of breath). Patient taking differently: Take 2.5 mg by mouth daily as needed (take 30 min prior to Demadex, if weight is 178 or over).  09/08/14  Yes Debbe Odea, MD  metoprolol tartrate (LOPRESSOR) 25 MG tablet Take 1 tablet (25 mg total) by mouth daily. 06/21/14  Yes Belva Crome, MD  nitroGLYCERIN (NITROSTAT) 0.4 MG SL tablet Place 1 tablet (0.4 mg total) under the tongue every 5 (five) minutes as needed for chest pain. 12/03/13  Yes Belva Crome, MD  OVER THE COUNTER MEDICATION Take 1 packet by mouth daily. Nature Made Diabetic Pak (6 tablet combo)   Yes Historical Provider, MD  polyethylene glycol (MIRALAX / GLYCOLAX) packet Take 17 g by mouth 2 (two) times daily.    Yes Historical Provider, MD  potassium chloride SA (K-DUR,KLOR-CON) 20 MEQ tablet Take 1 tablet (20 mEq total) by mouth daily. 09/08/14  Yes Debbe Odea, MD  Tafluprost 0.0015 % SOLN Place 1 drop into both eyes at bedtime. ZIOPTAN   Yes Historical Provider, MD  torsemide (DEMADEX) 20 MG tablet Take 4 tablets (80 mg total) by mouth daily. 07/03/14  Yes Eileen Stanford, PA-C  insulin glargine (LANTUS) 100 UNIT/ML injection Inject 0.08  mLs (8 Units total) into the skin at bedtime. Sliding scale If at lunch gbc is between 159 do not give any. If 299 or above give 3 units. At bedtime it is always 9 unitsComment exceeds the 0 character limit Patient not taking: Reported on 10/04/2014 09/08/14   Debbe Odea, MD   Physical Exam: Filed Vitals:   10/04/14 1500 10/04/14 1515 10/04/14 1530 10/04/14 1619  BP: 134/64 127/59 123/67 137/66  Pulse: 67 67 65 60  Temp:    97.4 F (36.3 C)  TempSrc:    Oral  Resp: _0 Height:    5' 7" (1.702 m)  Weight:    79.606 kg (175 lb 8 oz)  SpO2: 97% 100% 97% 97%     General:  Adult male, no acute distress  Eyes:  PERRL, anicteric, non-injected.  ENT:  Nares clear.  OP clear, non-erythematous without plaques or exudates.  MMM.  Neck:  Supple without TM or JVD.    Lymph:  No cervical, supraclavicular, or submandibular LAD.  Cardiovascular:  Mildly bradycardic, normal S1, S2, without m/r/g.  2+ pulses, warm extremities  Respiratory:  CTA bilaterally without increased WOB.  Abdomen:  NABS.  Soft, ND/NT.    Skin:  No rashes or focal lesions.  Musculoskeletal:  Normal bulk and tone.  Trace bilateral pitting LE edema.  Psychiatric:  A & O x 4.  Appropriate affect.  Neurologic:  CN 3-12 intact, no nystagmus, and nystagmus was not inducible by having him sit up or by having him turn his head.  5/5 strength except left foot dorsiflexion c/w foot drop.  Mild dysmetria, particularly noticeable on the right  Labs on Admission:  Basic Metabolic Panel:  Recent Labs Lab 10/04/14 1116  NA 135  K 3.1*  CL 87*  CO2 36*  GLUCOSE 301*  BUN 48*  CREATININE 2.62*  CALCIUM 9.3   Liver Function Tests:  Recent Labs Lab 10/04/14 1116  AST 54*  ALT 36  ALKPHOS 59  BILITOT 0.7  PROT 6.4*  ALBUMIN 3.1*   No results for input(s): LIPASE, AMYLASE in the last 168 hours. No results for input(s):  AMMONIA in the last 168 hours. CBC:  Recent Labs Lab 10/04/14 1116  WBC 6.0  NEUTROABS 4.0  HGB 11.4*  HCT 35.1*  MCV 99.4  PLT 174   Cardiac Enzymes: No results for input(s): CKTOTAL, CKMB, CKMBINDEX, TROPONINI in the last 168 hours.  BNP (last 3 results)  Recent Labs  07/23/14 1827 09/04/14 1439 10/04/14 1241  BNP 359.1* 1383.6* 467.9*    ProBNP (last 3 results)  Recent Labs  12/23/13 0535 04/04/14 0742 07/16/14 1213  PROBNP 4564.0* 4874.0* 460.0*    CBG:  Recent Labs Lab 10/04/14 1349  GLUCAP 240*    Radiological Exams on Admission: Dg Chest 2 View  10/04/2014   CLINICAL DATA:  Dizziness  EXAM: CHEST  2 VIEW  COMPARISON:  September 04, 2014  FINDINGS: There is stable cardiomegaly. The pulmonary vascularity is normal. There is no appreciable edema or consolidation. Pacemaker leads are attached to the right atrium and right ventricle. No adenopathy. There is mild degenerative change in the thoracic spine and both shoulders.  IMPRESSION: Cardiomegaly.  No edema or consolidation.   Electronically Signed   By: Lowella Grip III M.D.   On: 10/04/2014 13:52   Ct Head Wo Contrast  10/04/2014   CLINICAL DATA:  Vertigo for several days  EXAM: CT HEAD WITHOUT CONTRAST  TECHNIQUE: Contiguous axial  images were obtained from the base of the skull through the vertex without intravenous contrast.  COMPARISON:  March 10, 2012  FINDINGS: Moderate diffuse atrophy is stable. There is no intracranial mass, hemorrhage, extra-axial fluid collection, or midline shift. There is mild small vessel disease in the centra semiovale bilaterally, slightly more on the left than on the right, stable. No new gray-white compartment lesion identified. No acute infarct apparent. Basal ganglia calcification is felt to be physiologic in this age group. The bony calvarium appears intact. The mastoid air cells clear.  IMPRESSION: Atrophy with periventricular small vessel disease, stable. No intracranial  mass, hemorrhage, or acute appearing infarct. Mastoid air cells are clear.   Electronically Signed   By: Lowella Grip III M.D.   On: 10/04/2014 13:44    EKG: Independently reviewed. Ventricularly paced rhythm, stable from prior  Assessment/Plan Principal Problem:   Vertigo Active Problems:   Type 2 diabetes mellitus with peripheral neuropathy   CKD (chronic kidney disease) stage 4, GFR 15-29 ml/min   Chronic diastolic heart failure  ---  Vertigo, possibly related to BPPV however his symptoms are not inducible by myself.  They may also be related to mild virus. He does not have any other symptoms suggestive of stroke. -  Meclizine when necessary -  Phenergan his previously made about tended to we will avoid -  PT for vestibular rehabilitation  Acute hypoxic respiratory failure likely secondary to acute on chronic systolic and diastolic CHF exacerbation.   -  Telemetry -  Daily weights and strict I/O -  No ACEI 2/2 CKD -  Continue beta blocker and torsemide -  2 gm sodium diet with 1.2L fluid restriction  CAD/HTN, stable, continue eliquis, BB, imdur, no statin due to age and comorbidities  Chronic atrial fibrillation s/p pacemaker placement -  CHADs2vasc = 6  (CHF, HTN, Age > 75 2, Diabetes, Stroke2, vasc, 65-74, F) -  HASBLED = 3  -  Continue Eliquis, amiodarone  Chronic kidney disease stage 4, baseline creatinine 2.5 due to diabetes and high blood pressure -  Minimize nephrotoxins and renally dose medications  Diabetes mellitus type 2 with renal manifestations -  A1c 8.2 07/16/2014 -  Hold oral medications -  Levemir 10 units  -  SSI and HS insulin  Hypokalemia -  Oral potassium repletion  Iron deficiency anemia, stable, hemoglobin continue iron  Gout, stable, continue allopurinol   Diet:  Low sodium, diabetic Access:  PIV IVF:  off Proph:  eliquis  Code Status: full code Family Communication: patient alone Disposition Plan: Admit to telemetry  Time  spent: 60 min Janece Canterbury [facility] Pager [phone]  If 7PM-7AM, please contact night-coverage [email or web address] Password [facility] 10/04/2014, 4:23 PM

## 2014-10-04 NOTE — ED Notes (Signed)
Assisted pt back to bed after using BSC.

## 2014-10-04 NOTE — ED Notes (Signed)
Attempted report states nurse at lunch and unable to take report.

## 2014-10-04 NOTE — ED Notes (Signed)
MD notified patient CBG 240

## 2014-10-05 ENCOUNTER — Encounter (HOSPITAL_COMMUNITY): Payer: Self-pay | Admitting: General Practice

## 2014-10-05 DIAGNOSIS — I509 Heart failure, unspecified: Secondary | ICD-10-CM | POA: Diagnosis not present

## 2014-10-05 DIAGNOSIS — E860 Dehydration: Secondary | ICD-10-CM | POA: Diagnosis not present

## 2014-10-05 DIAGNOSIS — R42 Dizziness and giddiness: Secondary | ICD-10-CM | POA: Diagnosis not present

## 2014-10-05 DIAGNOSIS — N184 Chronic kidney disease, stage 4 (severe): Secondary | ICD-10-CM

## 2014-10-05 DIAGNOSIS — I5032 Chronic diastolic (congestive) heart failure: Secondary | ICD-10-CM

## 2014-10-05 DIAGNOSIS — E1140 Type 2 diabetes mellitus with diabetic neuropathy, unspecified: Secondary | ICD-10-CM

## 2014-10-05 LAB — BASIC METABOLIC PANEL
Anion gap: 13 (ref 5–15)
BUN: 44 mg/dL — ABNORMAL HIGH (ref 6–20)
CALCIUM: 9.7 mg/dL (ref 8.9–10.3)
CO2: 34 mmol/L — AB (ref 22–32)
Chloride: 91 mmol/L — ABNORMAL LOW (ref 101–111)
Creatinine, Ser: 2.27 mg/dL — ABNORMAL HIGH (ref 0.61–1.24)
GFR calc Af Amer: 27 mL/min — ABNORMAL LOW (ref >60)
GFR, EST NON AFRICAN AMERICAN: 23 mL/min — AB (ref >60)
Glucose, Bld: 165 mg/dL — ABNORMAL HIGH (ref 65–99)
Potassium: 3.6 mmol/L (ref 3.5–5.1)
Sodium: 138 mmol/L (ref 135–145)

## 2014-10-05 LAB — CBC
HCT: 37.1 % — ABNORMAL LOW (ref 39.0–52.0)
Hemoglobin: 12.4 g/dL — ABNORMAL LOW (ref 13.0–17.0)
MCH: 33.2 pg (ref 26.0–34.0)
MCHC: 33.4 g/dL (ref 30.0–36.0)
MCV: 99.2 fL (ref 78.0–100.0)
PLATELETS: 190 10*3/uL (ref 150–400)
RBC: 3.74 MIL/uL — ABNORMAL LOW (ref 4.22–5.81)
RDW: 16.6 % — ABNORMAL HIGH (ref 11.5–15.5)
WBC: 6.6 10*3/uL (ref 4.0–10.5)

## 2014-10-05 LAB — GLUCOSE, CAPILLARY
GLUCOSE-CAPILLARY: 163 mg/dL — AB (ref 65–99)
Glucose-Capillary: 256 mg/dL — ABNORMAL HIGH (ref 65–99)

## 2014-10-05 MED ORDER — MECLIZINE HCL 12.5 MG PO TABS: 12.5000 mg | ORAL_TABLET | Freq: Three times a day (TID) | ORAL | Status: DC | PRN

## 2014-10-05 NOTE — Progress Notes (Signed)
Talked to patient about outpatient PT choices, patient chose [facility] for Outpatient PT Vestibular training; referral made and the rehab will contact the patient at home for a start up date and time for his therapy; Aneta Mins [phone]

## 2014-10-05 NOTE — Evaluation (Signed)
Physical Therapy Evaluation and D/C Patient Details Name: [name] MRN: [id number] DOB: Aug 29, 1918 Today's Date: 10/05/2014   History of Present Illness  The patient is a 81 y.o. year-old male with history of chronic diastolic heart failure followed closely by Dr. Pernell Dupre, paroxysmal atrial fibrillation on amiodarone, see Katie stage III/4, hypertension, chronic anticoagulation who presents with room spinning sensation. The patient was last at their baseline health yesterday. He has very tenuous heart failure and if he gains or loses a pound T the becomes dehydrated with acute kidney injury or he becomes short of breath. He is seen by [name] weekly for evaluations. He has had some symptoms of lightheadedness associated with dehydration and acute kidney injury. Yesterday he had some lightheadedness and some mild weakness and he felt like he may be dehydrated or have some low potassium. He states that this morning, he weighed himself and he had gained several pounds and he took an extra dose of metolazone. He subsequently had the sensation of room spinning when he stood up. He stated it was like being on a merry-go-round. He denied feeling flushed, nauseated, or presyncopal. His wife helped him lie down on the floor and they waited for EMS to arrive. He denied chest pains, palpitations, shortness of breath. He has not had any recent symptoms of infection such as fevers, chills, cough, dysuria. He denies focal numbness, tingling, weakness, slurred speech or confusion.  Clinical Impression  Pt admitted with above diagnosis. Pt currently with functional limitations due to the deficits listed below (see PT Problem List). Pt to d/c home today as he is close to baseline.  Did test positive for right BPPV.  Outpt PT f/u recommended.  Caregiver and son provide 24 hour care at home.   Pt will benefit from skilled PT to increase their independence and safety with mobility to allow discharge to the  venue listed below.      Follow Up Recommendations Outpatient PT;Supervision/Assistance - 24 hour (for vestibular rehab)    Equipment Recommendations  None recommended by PT    Recommendations for Other Services       Precautions / Restrictions Precautions Precautions: Fall Other Brace/Splint: Left LE brace built in shoe Restrictions Weight Bearing Restrictions: No      Mobility  Bed Mobility Overal bed mobility: Needs Assistance Bed Mobility: Supine to Sit     Supine to sit: Min guard     General bed mobility comments: Pt was able to sit up with cues for technique.   Transfers Overall transfer level: Needs assistance Equipment used: Rolling walker (2 wheeled) Transfers: Sit to/from Stand Sit to Stand: Min assist         General transfer comment: Pt needed a little assist to power up.  States he has this assist at home.   Ambulation/Gait Ambulation/Gait assistance: Min guard Ambulation Distance (Feet): 18 Feet Assistive device: Rolling walker (2 wheeled) Gait Pattern/deviations: Step-through pattern;Decreased stride length;Decreased step length - left;Decreased stance time - left;Decreased weight shift to left;Decreased dorsiflexion - left;Wide base of support;Trunk flexed   Gait velocity interpretation: Below normal speed for age/gender General Gait Details: Pt ambulated with RW with overall fair safety awareness with some cues for postural stability.  Pt fatigues quickly and needs rest break after 15 feet.  Pt demonstrates fair safety needing cues at times for safety awareness. Does well with left LE brace on.    Stairs            Emergency planning/management officer  Modified Rankin (Stroke Patients Only)       Balance Overall balance assessment: Needs assistance Sitting-balance support: No upper extremity supported;Feet supported Sitting balance-Leahy Scale: Fair     Standing balance support: Bilateral upper extremity supported;During functional  activity Standing balance-Leahy Scale: Poor Standing balance comment: Needs RW for UE support.                              Pertinent Vitals/Pain Pain Assessment: No/denies pain  VSS    Home Living Family/patient expects to be discharged to:: Private residence Living Arrangements: Spouse/significant other Available Help at Discharge: Personal care attendant;Available 24 hours/day;Family Type of Home: House Home Access: Stairs to enter Entrance Stairs-Rails: Right;Left;Can reach both Entrance Stairs-Number of Steps: 3 Home Layout: One level Home Equipment: Walker - 4 wheels;Tub bench;Hand held shower head;Walker - 2 wheels;Adaptive equipment      Prior Function Level of Independence: Needs assistance   Gait / Transfers Assistance Needed: Ambulated with rollator and would sit prn when legs got tired.  Walked short distances with the caregiver and son  ADL's / Homemaking Assistance Needed: assisted for tub bench transfer, to wash feet and back, for LB dressing, meal prep and housekeeping. Pt routinely sits to perform grooming on his rollator.  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     Hand Dominance   Dominant Hand: Right    Extremity/Trunk Assessment   Upper Extremity Assessment: Defer to OT evaluation           Lower Extremity Assessment: LLE deficits/detail   LLE Deficits / Details: DF 2+/5, PF 2/5, knee flex 2+/5, knee extension 2/5, hip 3/5     Communication   Communication: HOH  Cognition Arousal/Alertness: Awake/alert Behavior During Therapy: WFL for tasks assessed/performed Overall Cognitive Status: Within Functional Limits for tasks assessed                      General Comments General comments (skin integrity, edema, etc.): Pt with positive Hallpike Dix test for right BPPV.  Treated with canalith repositionning.  Pt with 5/10 dizziness initially and 0/10 dizzy after treatment.  Pt  was able to walk after treatment too with fairly good stability with RW.      Exercises General Exercises - Lower Extremity Ankle Circles/Pumps: AROM;Both;10 reps;Seated Long Arc Quad: AROM;Both;10 reps;Seated Hip Flexion/Marching: AROM;Both;10 reps;Seated      Assessment/Plan    PT Assessment All further PT needs can be met in the next venue of care  PT Diagnosis Generalized weakness (dizziness)   PT Problem List Decreased balance (dizziness)  PT Treatment Interventions     PT Goals (Current goals can be found in the Care Plan section) Acute Rehab PT Goals Patient Stated Goal: to go home PT Goal Formulation: All assessment and education complete, DC therapy    Frequency     Barriers to discharge        Co-evaluation               End of Session Equipment Utilized During Treatment: Gait belt Activity Tolerance: Patient limited by fatigue Patient left: in chair;with call bell/phone within reach Nurse Communication: Mobility status    Functional Assessment Tool Used: clinical judgment Functional Limitation: Mobility: Walking and moving around Mobility: Walking and Moving Around Current Status (L3903): At least 1 percent but less than 20 percent impaired, limited or  restricted Mobility: Walking and Moving Around Goal Status [phone]): At least 1 percent but less than 20 percent impaired, limited or restricted Mobility: Walking and Moving Around Discharge Status [phone]): At least 1 percent but less than 20 percent impaired, limited or restricted    Time: 1130-1202 PT Time Calculation (min) (ACUTE ONLY): 32 min   Charges:   PT Evaluation $Initial PT Evaluation Tier I: 1 Procedure PT Treatments $Canalith Rep Proc: 8-22 mins   PT G Codes:   PT G-Codes **NOT FOR INPATIENT CLASS** Functional Assessment Tool Used: clinical judgment Functional Limitation: Mobility: Walking and moving around Mobility: Walking and Moving Around Current Status (Q9643): At least 1 percent  but less than 20 percent impaired, limited or restricted Mobility: Walking and Moving Around Goal Status [phone]): At least 1 percent but less than 20 percent impaired, limited or restricted Mobility: Walking and Moving Around Discharge Status [phone]): At least 1 percent but less than 20 percent impaired, limited or restricted    Denice Paradise 10/05/2014, 2:35 PM Masiah Woody,PT Acute Rehabilitation [phone] (pager)

## 2014-10-05 NOTE — Progress Notes (Signed)
DC IV, DC Tele, DC Home. Discharge instructions and home medications discussed with patient and caregiver. Patient and caregiver denied any questions or concerns at this time. Patient leaving unit via wheelchair and appears in no acute distress.

## 2014-10-05 NOTE — Progress Notes (Signed)
Patient alert oriented, no c/o pain or dizziness, iv and tele d/c. Discharge instruction explain and given to the caregiver. Patient d/c per order.

## 2014-10-05 NOTE — Discharge Summary (Signed)
Physician Discharge Summary  [name] VOH:[id number] DOB: [birth year] DOA: 10/04/2014  PCP: [name], MD  Admit date: 10/04/2014 Discharge date: 10/05/2014  Time spent: 35 minutes  Recommendations for Outpatient Follow-up:  Patient will be discharged to home with outpatient physical therapy.  Patient will need to follow up with primary care provider within one week of discharge.  Patient should continue medications as prescribed.  Patient should follow a heart healthy/carb modified diet. Follow up with [name], cardiology, as scheduled.   Discharge Diagnoses:  Vertigo Acute hypoxic respiratory failure secondary acute on chronic systolic and diastolic CHF  CAD/HTN Chronic atrial fibrillation  Chronic kidney disease stage 4 Diabetes mellitus type 2  Hypokalemia Iron deficiency anemia Gout  Discharge Condition: Stable  Diet recommendation: heart healthy/carb modified diet  Filed Weights   10/04/14 1619 10/05/14 0544  Weight: 79.606 kg (175 lb 8 oz) 80.332 kg (177 lb 1.6 oz)    History of present illness:  On 10/04/2014 by Dr. Janece Canterbury The patient is a 81 y.o. year-old male with history of chronic diastolic heart failure followed closely by Dr. Pernell Dupre, paroxysmal atrial fibrillation on amiodarone, see Katie stage III/4, hypertension, chronic anticoagulation who presents with room spinning sensation. The patient was last at their baseline health yesterday. He has very tenuous heart failure and if he gains or loses a pound T the becomes dehydrated with acute kidney injury or he becomes short of breath. He is seen by [name] weekly for evaluations. He has had some symptoms of lightheadedness associated with dehydration and acute kidney injury. Yesterday he had some lightheadedness and some mild weakness and he felt like he may be dehydrated or have some low potassium. He states that this morning, he weighed himself and he had gained several pounds and he  took an extra dose of metolazone. He subsequently had the sensation of room spinning when he stood up. He stated it was like being on a merry-go-round. He denied feeling flushed, nauseated, or presyncopal. His wife helped him lie down on the floor and they waited for EMS to arrive. He denied chest pains, palpitations, shortness of breath. He has not had any recent symptoms of infection such as fevers, chills, cough, dysuria. He denies focal numbness, tingling, weakness, slurred speech or confusion.  In the emergency department, his vital signs were stable. His labs were notable for chronic mild anemia, mild hypokalemia, creatinine at baseline of 2.62, mild hyperglycemia 301. His chest x-ray was stable and his head CT showed no acute changes. He was seen by [name] in the emergency department who determined that he was near euvolemic despite the fact that he recently gotten a 500 cc bolus by the emergency department physician. He stated that we should not change his diuretics nor should we give him IV fluids. He is being admitted for room spinning sensation with inability to ambulate.  Hospital Course:  Vertigo -possibly related to BPPV (per PT, right) -They may also be related to mild virus. He does not have any other symptoms suggestive of stroke. -Meclizine when necessary -Phenergan his previously made about tended to we will avoid -PT for vestibular rehabilitation, needs 24hr supervision and outpatient PT  Acute hypoxic respiratory failure secondary acute on chronic systolic and diastolic CHF  -Monitored daily weights and strict I/O -No ACEI 2/2 CKD -Continue beta blocker and torsemide -2 gm sodium diet with 1.2L fluid restriction  CAD/HTN -continue eliquis, BB, imdur, no statin due to age and comorbidities  Chronic atrial fibrillation  -s/p pacemaker placement -CHADs2vasc = 6 (CHF, HTN, Age > 75 2, Diabetes, Stroke2, vasc, 65-74, F) -Continue Eliquis, amiodarone  Chronic  kidney disease stage 4 -baseline creatinine 2.5 due to diabetes and high blood pressure -Cr 2.27 -Minimize nephrotoxins and renally dose medications  Diabetes mellitus type 2  -with renal manifestations -A1c 8.2 07/16/2014 -Hold oral medications -Levemir 10 units  -SSI and HS insulin  Hypokalemia -Resolved  Iron deficiency anemia -Stable, hemoglobin continue iron  Gout -Continue allopurinol  Procedures: None  Consultations: None  Discharge Exam: Filed Vitals:   10/05/14 0956  BP: 126/58  Pulse: 61  Temp:   Resp:      General: Well developed, well nourished, NAD, appears stated age  HEENT: NCAT, mucous membranes moist.  Cardiovascular: S1 S2 auscultated, no rubs, murmurs or gallops. Regular rate and rhythm.  Respiratory: Clear to auscultation bilaterally with equal chest rise  Abdomen: Soft, nontender, nondistended, + bowel sounds  Extremities: warm dry without cyanosis clubbing or edema  Neuro: AAOx3, cranial nerves grossly intact. Strength 5/5 in patient's upper and lower extremities bilaterally  Psych: Normal affect and demeanor with intact judgement and insight  Discharge Instructions      Discharge Instructions    Discharge instructions    Complete by:  As directed   Patient will be discharged to home with outpatient physical therapy.  Patient will need to follow up with primary care provider within one week of discharge.  Patient should continue medications as prescribed.  Patient should follow a heart healthy/carb modified diet. Follow up with [name], cardiology, as scheduled.            Medication List    TAKE these medications        allopurinol 100 MG tablet  Commonly known as:  ZYLOPRIM  Take 100 mg by mouth 2 (two) times daily.     amiodarone 200 MG tablet  Commonly known as:  PACERONE  Take 1 tablet (200 mg total) by mouth at bedtime.     apixaban 2.5 MG Tabs tablet  Commonly known as:  ELIQUIS  Take 2.5 mg by mouth 2 (two)  times daily.     Carboxymethylcellul-Glycerin 0.5-0.9 % Soln  Place 1 drop into both eyes 3 (three) times daily as needed (dry eyes).     carboxymethylcellulose 1 % ophthalmic solution  Place 1 drop into both eyes at bedtime.     ferrous sulfate 325 (65 FE) MG tablet  Take 325 mg by mouth every other day.     fexofenadine 180 MG tablet  Commonly known as:  ALLEGRA  Take 180 mg by mouth at bedtime.     insulin glargine 100 unit/mL Sopn  Commonly known as:  LANTUS  Inject 10 Units into the skin at bedtime.     isosorbide mononitrate 60 MG 24 hr tablet  Commonly known as:  IMDUR  Take 1 tablet (60 mg total) by mouth daily.     meclizine 12.5 MG tablet  Commonly known as:  ANTIVERT  Take 1 tablet (12.5 mg total) by mouth 3 (three) times daily as needed for dizziness.     metolazone 2.5 MG tablet  Commonly known as:  ZAROXOLYN  Take 1 tablet (2.5 mg total) by mouth daily as needed (take 30 min prior to Demadex, if weight goes up 3 pounds from dry weigth or develops shortness of breath).     metoprolol tartrate 25 MG tablet  Commonly known as:  LOPRESSOR  Take 1 tablet (25 mg total) by mouth daily.     nitroGLYCERIN 0.4 MG SL tablet  Commonly known as:  NITROSTAT  Place 1 tablet (0.4 mg total) under the tongue every 5 (five) minutes as needed for chest pain.     NOVOLOG FLEXPEN 100 UNIT/ML FlexPen  Generic drug:  insulin aspart  Inject 0-10 Units into the skin 3 (three) times daily with meals. Per sliding scale:  CBG <175 no insulin (unless consuming large amounts of carbs; 6-10 units for CBG >175 based on actual CBG and number of carbs in meal; >325 call MD     OVER THE COUNTER MEDICATION  Take 1 packet by mouth daily. Nature Made Diabetic Pak (6 tablet combo)     polyethylene glycol packet  Commonly known as:  MIRALAX / GLYCOLAX  Take 17 g by mouth 2 (two) times daily.     potassium chloride SA 20 MEQ tablet  Commonly known as:  K-DUR,KLOR-CON  Take 1 tablet (20 mEq  total) by mouth daily.     Tafluprost 0.0015 % Soln  Place 1 drop into both eyes at bedtime. ZIOPTAN     torsemide 20 MG tablet  Commonly known as:  DEMADEX  Take 4 tablets (80 mg total) by mouth daily.       Allergies  Allergen Reactions  . Statins Other (See Comments)    rhabdomyolisis  . Aggrenox [Aspirin-Dipyridamole Er] Other (See Comments)    Dizziness  . Carvedilol Other (See Comments)    Dizziness  . Claritin [Loratadine] Other (See Comments)    Dizziness & drowsiness   . Cymbalta [Duloxetine Hcl]     Confusion   . Mucinex [Guaifenesin Er] Other (See Comments)    Dizziness, drowsiness  . Phenergan [Promethazine Hcl] Other (See Comments)    Causes severe confusion  . Plavix [Clopidogrel Bisulfate] Other (See Comments)    Dizziness  . Rapaflo [Silodosin] Other (See Comments)    Dizziness for about 30- 45  Minutes At time of preop appointment on 08/26/2014 patient denies any problems with this medication  . Travatan Z [Travoprost] Other (See Comments)    Eye irritation   Follow-up Information    Follow up with [name], MD. Schedule an appointment as soon as possible for a visit in 1 week.   Specialty:  Internal Medicine   Why:  Hospital follow up   Contact information:   [address] [place] [address] [phone]        The results of significant diagnostics from this hospitalization (including imaging, microbiology, ancillary and laboratory) are listed below for reference.    Significant Diagnostic Studies: Dg Chest 2 View  10/04/2014   CLINICAL DATA:  Dizziness  EXAM: CHEST  2 VIEW  COMPARISON:  September 04, 2014  FINDINGS: There is stable cardiomegaly. The pulmonary vascularity is normal. There is no appreciable edema or consolidation. Pacemaker leads are attached to the right atrium and right ventricle. No adenopathy. There is mild degenerative change in the thoracic spine and both shoulders.  IMPRESSION: Cardiomegaly.  No edema  or consolidation.   Electronically Signed   By: Lowella Grip III M.D.   On: 10/04/2014 13:52   Ct Head Wo Contrast  10/04/2014   CLINICAL DATA:  Vertigo for several days  EXAM: CT HEAD WITHOUT CONTRAST  TECHNIQUE: Contiguous axial images were obtained from the base of the skull through the vertex without intravenous contrast.  COMPARISON:  March 10, 2012  FINDINGS: Moderate diffuse  atrophy is stable. There is no intracranial mass, hemorrhage, extra-axial fluid collection, or midline shift. There is mild small vessel disease in the centra semiovale bilaterally, slightly more on the left than on the right, stable. No new gray-white compartment lesion identified. No acute infarct apparent. Basal ganglia calcification is felt to be physiologic in this age group. The bony calvarium appears intact. The mastoid air cells clear.  IMPRESSION: Atrophy with periventricular small vessel disease, stable. No intracranial mass, hemorrhage, or acute appearing infarct. Mastoid air cells are clear.   Electronically Signed   By: Lowella Grip III M.D.   On: 10/04/2014 13:44    Microbiology: No results found for this or any previous visit (from the past 240 hour(s)).   Labs: Basic Metabolic Panel:  Recent Labs Lab 10/04/14 1116 10/05/14 0826  NA 135 138  K 3.1* 3.6  CL 87* 91*  CO2 36* 34*  GLUCOSE 301* 165*  BUN 48* 44*  CREATININE 2.62* 2.27*  CALCIUM 9.3 9.7   Liver Function Tests:  Recent Labs Lab 10/04/14 1116  AST 54*  ALT 36  ALKPHOS 59  BILITOT 0.7  PROT 6.4*  ALBUMIN 3.1*   No results for input(s): LIPASE, AMYLASE in the last 168 hours. No results for input(s): AMMONIA in the last 168 hours. CBC:  Recent Labs Lab 10/04/14 1116 10/05/14 0826  WBC 6.0 6.6  NEUTROABS 4.0  --   HGB 11.4* 12.4*  HCT 35.1* 37.1*  MCV 99.4 99.2  PLT 174 190   Cardiac Enzymes: No results for input(s): CKTOTAL, CKMB, CKMBINDEX, TROPONINI in the last 168 hours. BNP: BNP (last 3  results)  Recent Labs  07/23/14 1827 09/04/14 1439 10/04/14 1241  BNP 359.1* 1383.6* 467.9*    ProBNP (last 3 results)  Recent Labs  12/23/13 0535 04/04/14 0742 07/16/14 1213  PROBNP 4564.0* 4874.0* 460.0*    CBG:  Recent Labs Lab 10/04/14 1349 10/04/14 1626 10/04/14 2102 10/05/14 0703 10/05/14 1121  GLUCAP 240* 167* 208* 163* 256*       Signed:  Cristal Ford  [facility] 10/05/2014, 2:16 PM

## 2014-10-05 NOTE — Discharge Instructions (Signed)
Benign Positional Vertigo Vertigo means you feel like you or your surroundings are moving when they are not. Benign positional vertigo is the most common form of vertigo. Benign means that the cause of your condition is not serious. Benign positional vertigo is more common in older adults. CAUSES  Benign positional vertigo is the result of an upset in the labyrinth system. This is an area in the middle ear that helps control your balance. This may be caused by a viral infection, head injury, or repetitive motion. However, often no specific cause is found. SYMPTOMS  Symptoms of benign positional vertigo occur when you move your head or eyes in different directions. Some of the symptoms may include:  Loss of balance and falls.  Vomiting.  Blurred vision.  Dizziness.  Nausea.  Involuntary eye movements (nystagmus). DIAGNOSIS  Benign positional vertigo is usually diagnosed by physical exam. If the specific cause of your benign positional vertigo is unknown, your caregiver may perform imaging tests, such as magnetic resonance imaging (MRI) or computed tomography (CT). TREATMENT  Your caregiver may recommend movements or procedures to correct the benign positional vertigo. Medicines such as meclizine, benzodiazepines, and medicines for nausea may be used to treat your symptoms. In rare cases, if your symptoms are caused by certain conditions that affect the inner ear, you may need surgery. HOME CARE INSTRUCTIONS   Follow your caregiver's instructions.  Move slowly. Do not make sudden body or head movements.  Avoid driving.  Avoid operating heavy machinery.  Avoid performing any tasks that would be dangerous to you or others during a vertigo episode.  Drink enough fluids to keep your urine clear or pale yellow. SEEK IMMEDIATE MEDICAL CARE IF:   You develop problems with walking, weakness, numbness, or using your arms, hands, or legs.  You have difficulty speaking.  You develop  severe headaches.  Your nausea or vomiting continues or gets worse.  You develop visual changes.  Your family or friends notice any behavioral changes.  Your condition gets worse.  You have a fever.  You develop a stiff neck or sensitivity to light. MAKE SURE YOU:   Understand these instructions.  Will watch your condition.  Will get help right away if you are not doing well or get worse. Document Released: 02/05/2006 Document Revised: 07/23/2011 Document Reviewed: 01/18/2011 Exit[employer] Patient Information 2015 [employer]. This information is not intended to replace advice given to you by your health care provider. Make sure you discuss any questions you have with your health care provider.    

## 2014-10-10 ENCOUNTER — Observation Stay (HOSPITAL_COMMUNITY)
Admission: EM | Admit: 2014-10-10 | Discharge: 2014-10-11 | Disposition: A | Discharge status: Home / Self Care | Payer: Medicare Other | Attending: Family Medicine | Admitting: Family Medicine

## 2014-10-10 ENCOUNTER — Encounter (HOSPITAL_COMMUNITY): Payer: Self-pay

## 2014-10-10 DIAGNOSIS — N184 Chronic kidney disease, stage 4 (severe): Secondary | ICD-10-CM | POA: Diagnosis not present

## 2014-10-10 DIAGNOSIS — I5022 Chronic systolic (congestive) heart failure: Secondary | ICD-10-CM | POA: Insufficient documentation

## 2014-10-10 DIAGNOSIS — I739 Peripheral vascular disease, unspecified: Secondary | ICD-10-CM | POA: Insufficient documentation

## 2014-10-10 DIAGNOSIS — I272 Other secondary pulmonary hypertension: Secondary | ICD-10-CM | POA: Diagnosis not present

## 2014-10-10 DIAGNOSIS — I129 Hypertensive chronic kidney disease with stage 1 through stage 4 chronic kidney disease, or unspecified chronic kidney disease: Secondary | ICD-10-CM | POA: Diagnosis not present

## 2014-10-10 DIAGNOSIS — Z888 Allergy status to other drugs, medicaments and biological substances status: Secondary | ICD-10-CM | POA: Insufficient documentation

## 2014-10-10 DIAGNOSIS — E1142 Type 2 diabetes mellitus with diabetic polyneuropathy: Secondary | ICD-10-CM | POA: Diagnosis not present

## 2014-10-10 DIAGNOSIS — Z7901 Long term (current) use of anticoagulants: Secondary | ICD-10-CM | POA: Insufficient documentation

## 2014-10-10 DIAGNOSIS — M109 Gout, unspecified: Secondary | ICD-10-CM | POA: Insufficient documentation

## 2014-10-10 DIAGNOSIS — Z8673 Personal history of transient ischemic attack (TIA), and cerebral infarction without residual deficits: Secondary | ICD-10-CM | POA: Diagnosis not present

## 2014-10-10 DIAGNOSIS — Z794 Long term (current) use of insulin: Secondary | ICD-10-CM | POA: Insufficient documentation

## 2014-10-10 DIAGNOSIS — R42 Dizziness and giddiness: Secondary | ICD-10-CM

## 2014-10-10 DIAGNOSIS — Z87891 Personal history of nicotine dependence: Secondary | ICD-10-CM | POA: Insufficient documentation

## 2014-10-10 DIAGNOSIS — E1140 Type 2 diabetes mellitus with diabetic neuropathy, unspecified: Secondary | ICD-10-CM

## 2014-10-10 DIAGNOSIS — Z950 Presence of cardiac pacemaker: Secondary | ICD-10-CM | POA: Insufficient documentation

## 2014-10-10 DIAGNOSIS — I480 Paroxysmal atrial fibrillation: Secondary | ICD-10-CM | POA: Diagnosis not present

## 2014-10-10 DIAGNOSIS — E785 Hyperlipidemia, unspecified: Secondary | ICD-10-CM | POA: Diagnosis not present

## 2014-10-10 LAB — BASIC METABOLIC PANEL
Anion gap: 10 (ref 5–15)
BUN: 45 mg/dL — ABNORMAL HIGH (ref 6–20)
CO2: 35 mmol/L — AB (ref 22–32)
CREATININE: 2.74 mg/dL — AB (ref 0.61–1.24)
Calcium: 9 mg/dL (ref 8.9–10.3)
Chloride: 91 mmol/L — ABNORMAL LOW (ref 101–111)
GFR calc Af Amer: 21 mL/min — ABNORMAL LOW (ref >60)
GFR, EST NON AFRICAN AMERICAN: 18 mL/min — AB (ref >60)
Glucose, Bld: 190 mg/dL — ABNORMAL HIGH (ref 65–99)
POTASSIUM: 3.5 mmol/L (ref 3.5–5.1)
SODIUM: 136 mmol/L (ref 135–145)

## 2014-10-10 LAB — GLUCOSE, CAPILLARY
GLUCOSE-CAPILLARY: 91 mg/dL (ref 65–99)
GLUCOSE-CAPILLARY: 167 mg/dL — AB (ref 65–99)

## 2014-10-10 LAB — CBC WITH DIFFERENTIAL/PLATELET
BASOS PCT: 0 % (ref 0–1)
Basophils Absolute: 0 10*3/uL (ref 0.0–0.1)
EOS PCT: 1 % (ref 0–5)
Eosinophils Absolute: 0.1 10*3/uL (ref 0.0–0.7)
HCT: 34.2 % — ABNORMAL LOW (ref 39.0–52.0)
Hemoglobin: 11.2 g/dL — ABNORMAL LOW (ref 13.0–17.0)
LYMPHS ABS: 1.4 10*3/uL (ref 0.7–4.0)
Lymphocytes Relative: 24 % (ref 12–46)
MCH: 32.8 pg (ref 26.0–34.0)
MCHC: 32.7 g/dL (ref 30.0–36.0)
MCV: 100.3 fL — ABNORMAL HIGH (ref 78.0–100.0)
MONO ABS: 0.5 10*3/uL (ref 0.1–1.0)
Monocytes Relative: 9 % (ref 3–12)
Neutro Abs: 4 10*3/uL (ref 1.7–7.7)
Neutrophils Relative %: 66 % (ref 43–77)
PLATELETS: 173 10*3/uL (ref 150–400)
RBC: 3.41 MIL/uL — ABNORMAL LOW (ref 4.22–5.81)
RDW: 16.7 % — ABNORMAL HIGH (ref 11.5–15.5)
WBC: 6.1 10*3/uL (ref 4.0–10.5)

## 2014-10-10 MED ORDER — INSULIN ASPART 100 UNIT/ML ~~LOC~~ SOLN: 0.0000 [IU] | Freq: Every day | SUBCUTANEOUS | Status: DC

## 2014-10-10 MED ORDER — METOPROLOL TARTRATE 25 MG PO TABS
25.0000 mg | ORAL_TABLET | Freq: Every day | ORAL | Status: DC
  Administered 2014-10-11: 25 mg via ORAL
  Filled 2014-10-10 (×2): qty 1

## 2014-10-10 MED ORDER — POLYETHYLENE GLYCOL 3350 17 G PO PACK
17.0000 g | PACK | Freq: Two times a day (BID) | ORAL | Status: DC
  Administered 2014-10-10: 17 g via ORAL
  Filled 2014-10-10 (×3): qty 1

## 2014-10-10 MED ORDER — METOLAZONE 2.5 MG PO TABS
2.5000 mg | ORAL_TABLET | Freq: Every day | ORAL | Status: DC | PRN
  Filled 2014-10-10: qty 1

## 2014-10-10 MED ORDER — CARBOXYMETHYLCELLUL-GLYCERIN 0.5-0.9 % OP SOLN: 1.0000 [drp] | Freq: Three times a day (TID) | OPHTHALMIC | Status: DC | PRN

## 2014-10-10 MED ORDER — ISOSORBIDE MONONITRATE ER 60 MG PO TB24
60.0000 mg | ORAL_TABLET | Freq: Every day | ORAL | Status: DC
  Administered 2014-10-11: 60 mg via ORAL
  Filled 2014-10-10 (×2): qty 1

## 2014-10-10 MED ORDER — APIXABAN 2.5 MG PO TABS
2.5000 mg | ORAL_TABLET | Freq: Two times a day (BID) | ORAL | Status: DC
  Administered 2014-10-10 – 2014-10-11 (×2): 2.5 mg via ORAL
  Filled 2014-10-10 (×3): qty 1

## 2014-10-10 MED ORDER — NITROGLYCERIN 0.4 MG SL SUBL: 0.4000 mg | SUBLINGUAL_TABLET | SUBLINGUAL | Status: DC | PRN

## 2014-10-10 MED ORDER — FERROUS SULFATE 325 (65 FE) MG PO TABS
325.0000 mg | ORAL_TABLET | ORAL | Status: DC
  Filled 2014-10-10: qty 1

## 2014-10-10 MED ORDER — ALLOPURINOL 100 MG PO TABS
100.0000 mg | ORAL_TABLET | Freq: Two times a day (BID) | ORAL | Status: DC
  Administered 2014-10-10 – 2014-10-11 (×2): 100 mg via ORAL
  Filled 2014-10-10 (×3): qty 1

## 2014-10-10 MED ORDER — INSULIN GLARGINE 100 UNIT/ML ~~LOC~~ SOLN
10.0000 [IU] | Freq: Every day | SUBCUTANEOUS | Status: DC
  Administered 2014-10-10: 10 [IU] via SUBCUTANEOUS
  Filled 2014-10-10 (×2): qty 0.1

## 2014-10-10 MED ORDER — AMIODARONE HCL 200 MG PO TABS
200.0000 mg | ORAL_TABLET | Freq: Every day | ORAL | Status: DC
  Administered 2014-10-10: 200 mg via ORAL
  Filled 2014-10-10 (×2): qty 1

## 2014-10-10 MED ORDER — HYPROMELLOSE (GONIOSCOPIC) 2.5 % OP SOLN
1.0000 [drp] | Freq: Three times a day (TID) | OPHTHALMIC | Status: DC | PRN
  Filled 2014-10-10: qty 15

## 2014-10-10 MED ORDER — MECLIZINE HCL 12.5 MG PO TABS
12.5000 mg | ORAL_TABLET | Freq: Three times a day (TID) | ORAL | Status: DC | PRN
  Filled 2014-10-10: qty 1

## 2014-10-10 MED ORDER — POTASSIUM CHLORIDE CRYS ER 20 MEQ PO TBCR
20.0000 meq | EXTENDED_RELEASE_TABLET | Freq: Every day | ORAL | Status: DC
  Administered 2014-10-11: 20 meq via ORAL
  Filled 2014-10-10: qty 1

## 2014-10-10 MED ORDER — CARBOXYMETHYLCELLULOSE SODIUM 1 % OP SOLN: 1.0000 [drp] | Freq: Every day | OPHTHALMIC | Status: DC

## 2014-10-10 MED ORDER — ACETAMINOPHEN 325 MG PO TABS: 650.0000 mg | ORAL_TABLET | Freq: Four times a day (QID) | ORAL | Status: DC | PRN

## 2014-10-10 MED ORDER — ONDANSETRON HCL 4 MG PO TABS: 4.0000 mg | ORAL_TABLET | Freq: Four times a day (QID) | ORAL | Status: DC | PRN

## 2014-10-10 MED ORDER — MECLIZINE HCL 25 MG PO TABS
12.5000 mg | ORAL_TABLET | Freq: Once | ORAL | Status: AC
  Administered 2014-10-10: 12.5 mg via ORAL
  Filled 2014-10-10: qty 1

## 2014-10-10 MED ORDER — ONDANSETRON HCL 4 MG/2ML IJ SOLN: 4.0000 mg | Freq: Four times a day (QID) | INTRAMUSCULAR | Status: DC | PRN

## 2014-10-10 MED ORDER — INSULIN ASPART 100 UNIT/ML ~~LOC~~ SOLN
0.0000 [IU] | Freq: Three times a day (TID) | SUBCUTANEOUS | Status: DC
  Administered 2014-10-11: 100 [IU] via SUBCUTANEOUS

## 2014-10-10 MED ORDER — TORSEMIDE 20 MG PO TABS
80.0000 mg | ORAL_TABLET | Freq: Every day | ORAL | Status: DC
  Administered 2014-10-11: 80 mg via ORAL
  Filled 2014-10-10 (×2): qty 4

## 2014-10-10 MED ORDER — INSULIN GLARGINE 100 UNITS/ML SOLOSTAR PEN
10.0000 [IU] | PEN_INJECTOR | Freq: Every day | SUBCUTANEOUS | Status: DC
  Filled 2014-10-10: qty 3

## 2014-10-10 MED ORDER — ACETAMINOPHEN 650 MG RE SUPP: 650.0000 mg | Freq: Four times a day (QID) | RECTAL | Status: DC | PRN

## 2014-10-10 MED ORDER — SODIUM CHLORIDE 0.9 % IJ SOLN
3.0000 mL | Freq: Two times a day (BID) | INTRAMUSCULAR | Status: DC
  Administered 2014-10-10: 3 mL via INTRAVENOUS

## 2014-10-10 NOTE — ED Provider Notes (Signed)
CSN: [id number]     Arrival date & time 10/10/14  1516 History   First MD Initiated Contact with Patient 10/10/14 1535     Chief Complaint  Patient presents with  . Dizziness     (Consider location/radiation/quality/duration/timing/severity/associated sxs/prior Treatment) HPI  81 year old male presents with intermittent dizziness since being discharged from the hospital on 5/24. This occurs mostly on standing and he has had at least one fall and one near fall at home. He has been taking 12.5 mg of Antivert but still seems to be developing the symptoms. The patient states the symptoms lasted proximal only 1 minute. There is no focal weakness or numbness or headache. No blurry vision or ear ringing sensation. He was diagnosed with peripheral vertigo upon discharge a few days ago. The patient has very tenuous heart failure but currently is at his dry weight. No vomiting or diarrhea.  Past Medical History  Diagnosis Date  . Hyperlipidemia   . Spinal stenosis   . Anemia   . Carotid artery disease     a. s/p prior carotid endarterectomy.  [name] PVD (peripheral vascular disease)   . Hyponatremia     Chronic  . CVA (cerebral infarction)     a. When asked to clarify patient says he was told thumb numbness may be TIA. He does not recall formal stroke. CT head results are only available through [facility], not in [facility].  . CKD (chronic kidney disease) stage 4, GFR 15-29 ml/min   . Long term (current) use of anticoagulants   . PAF (paroxysmal atrial fibrillation)     a. Asymptomatic. On Eliquis. CHADSVASC 7.  . Sick sinus syndrome     With DDD St. Jude PM, initially placed in 1993 by Dr. Nils Pyle. Device upgrade 10/2002 to DDD, by Dr. Rollene Fare complicated by bleeding. Subsequent gen change 2011.  [name] Hypertensive cardiovascular disease   . Chronic diastolic congestive heart failure     ECHO 05/20/12 LVEF estimated by 2D at 55-60%  . Arthritis   . Gait disorder   . Diabetic peripheral neuropathy associated  with type 2 diabetes mellitus   . Diabetes mellitus     insulin dependent  . Elevated troponin I level 06/22/2014    a. 06/2014 felt due to demand ischemia.  . Chronic anticoagulation     For PAF, on Eliquis   . Urinary retention   . Pulmonary hypertension   . Foot drop, left 07/21/2014  . Asterixis 07/21/2014  . Hypertension   . Presence of permanent cardiac pacemaker   . Pneumonia     hx of pneumonia x 1    Past Surgical History  Procedure Laterality Date  . Lumbar laminectomy  1995  . Back surgery    . Total knee arthroplasty Left   . Quadriceps tendon repair    . Cataract extraction    . Carotid endarterectomy    . Yag laser application Right 08/20/8117    Procedure: YAG LASER CAPSULOTOMY OF RIGHT EYE;  Surgeon: [name], MD;  Location: [facility];  Service: Ophthalmology;  Laterality: Right;  . Tonsillectomy    . Pacemaker insertion      DDD St. Jude PM, initially placed in 1993 by Dr. Nils Pyle. Device upgrade 10/2002 to DDD, by Dr. Rollene Fare complicated by bleeding.  . Carotid endarterectomy Right 2006  . Transurethral resection of prostate    . Cardioversion N/A 06/16/2013    Procedure: CARDIOVERSION BEDSIDE;  Surgeon: [name], MD;  Location: [facility];  Service: Cardiovascular;  Laterality: N/A;  . Pacemaker generator change  12/09/2009    SJM Accent DR RF gen change by Dr [name]  . Insert / replace / remove pacemaker      2004   . Transurethral resection of prostate N/A 08/27/2014    Procedure: TRANSURETHRAL RESECTION OF THE PROSTATE WITH GYRUS INSTRUMENTS;  Surgeon: [name], MD;  [facility];  Service: Urology;  Laterality: N/A;  . Cystoscopy N/A 08/27/2014    Procedure: CYSTOSCOPY;  Surgeon: [name], MD;  [facility];  Service: Urology;  Laterality: N/A;   Family History  Problem Relation Age of Onset  . Multiple myeloma Father   . Hypertension Mother   . Healthy Sister   . COPD Sister   . Heart failure Sister    History  Substance Use  Topics  . Smoking status: Former Smoker -- 2 years    Types: Pipe, Cigarettes    Quit date: 05/14/1958  . Smokeless tobacco: Never Used  . Alcohol Use: No     Comment: Cocktails occasionally    Review of Systems  Constitutional: Negative for fever.  Gastrointestinal: Negative for nausea, vomiting and abdominal pain.  Neurological: Positive for dizziness. Negative for syncope, weakness, numbness and headaches.  All other systems reviewed and are negative.     Allergies  Statins; Aggrenox; Carvedilol; Claritin; Cymbalta; Mucinex; Phenergan; Plavix; Rapaflo; and Travatan z  Home Medications   Prior to Admission medications   Medication Sig Start Date End Date Taking? Authorizing Provider  allopurinol (ZYLOPRIM) 100 MG tablet Take 100 mg by mouth 2 (two) times daily.     Historical Provider, MD  amiodarone (PACERONE) 200 MG tablet Take 1 tablet (200 mg total) by mouth at bedtime. 09/17/14   Belva Crome, MD  apixaban (ELIQUIS) 2.5 MG TABS tablet Take 2.5 mg by mouth 2 (two) times daily.    Historical Provider, MD  Carboxymethylcellul-Glycerin 0.5-0.9 % SOLN Place 1 drop into both eyes 3 (three) times daily as needed (dry eyes).    Historical Provider, MD  carboxymethylcellulose 1 % ophthalmic solution Place 1 drop into both eyes at bedtime.    Historical Provider, MD  ferrous sulfate 325 (65 FE) MG tablet Take 325 mg by mouth every other day.    Historical Provider, MD  fexofenadine (ALLEGRA) 180 MG tablet Take 180 mg by mouth at bedtime.     Historical Provider, MD  insulin aspart (NOVOLOG FLEXPEN) 100 UNIT/ML FlexPen Inject 0-10 Units into the skin 3 (three) times daily with meals. Per sliding scale:  CBG <175 no insulin (unless consuming large amounts of carbs; 6-10 units for CBG >175 based on actual CBG and number of carbs in meal; >325 call MD    Historical Provider, MD  insulin glargine (LANTUS) 100 unit/mL SOPN Inject 10 Units into the skin at bedtime.    Historical Provider, MD   isosorbide mononitrate (IMDUR) 60 MG 24 hr tablet Take 1 tablet (60 mg total) by mouth daily. 07/14/14   Belva Crome, MD  meclizine (ANTIVERT) 12.5 MG tablet Take 1 tablet (12.5 mg total) by mouth 3 (three) times daily as needed for dizziness. 10/05/14   Maryann Mikhail, DO  metolazone (ZAROXOLYN) 2.5 MG tablet Take 1 tablet (2.5 mg total) by mouth daily as needed (take 30 min prior to Demadex, if weight goes up 3 pounds from dry weigth or develops shortness of breath). Patient taking differently: Take 2.5 mg by mouth daily as needed (take 30 min prior  to Demadex, if weight is 178 or over).  09/08/14   Debbe Odea, MD  metoprolol tartrate (LOPRESSOR) 25 MG tablet Take 1 tablet (25 mg total) by mouth daily. 06/21/14   Belva Crome, MD  nitroGLYCERIN (NITROSTAT) 0.4 MG SL tablet Place 1 tablet (0.4 mg total) under the tongue every 5 (five) minutes as needed for chest pain. 12/03/13   Belva Crome, MD  OVER THE COUNTER MEDICATION Take 1 packet by mouth daily. Nature Made Diabetic Pak (6 tablet combo)    Historical Provider, MD  polyethylene glycol (MIRALAX / GLYCOLAX) packet Take 17 g by mouth 2 (two) times daily.     Historical Provider, MD  potassium chloride SA (K-DUR,KLOR-CON) 20 MEQ tablet Take 1 tablet (20 mEq total) by mouth daily. 09/08/14   Debbe Odea, MD  Tafluprost 0.0015 % SOLN Place 1 drop into both eyes at bedtime. [place] Provider, MD  torsemide (DEMADEX) 20 MG tablet Take 4 tablets (80 mg total) by mouth daily. 07/03/14   Eileen Stanford, PA-C   BP 126/62 mmHg  Pulse 60  Temp(Src) 97.6 F (36.4 C)  Resp 18  SpO2 96% Physical Exam  Constitutional: He is oriented to person, place, and time. He appears well-developed and well-nourished.  HENT:  Head: Normocephalic and atraumatic.  Right Ear: External ear normal.  Left Ear: External ear normal.  Nose: Nose normal.  Eyes: EOM are normal. Pupils are equal, round, and reactive to light. Right eye exhibits no discharge.  Left eye exhibits no discharge.  No nystagmus  Neck: Neck supple.  Cardiovascular: Normal rate, regular rhythm, normal heart sounds and intact distal pulses.   Pulmonary/Chest: Effort normal and breath sounds normal. He has no wheezes. He has no rales.  Abdominal: Soft. There is no tenderness.  Musculoskeletal: He exhibits no edema.  Neurological: He is alert and oriented to person, place, and time.  CN 2-12 grossly intact. 5/5 strength in all 4 extremities. Normal finger to nose. Very unsteady when standing up, unable to ambulate  Skin: Skin is warm and dry.  Nursing note and vitals reviewed.   ED Course  Procedures (including critical care time) Labs Review Labs Reviewed  CBC WITH DIFFERENTIAL/PLATELET - Abnormal; Notable for the following:    RBC 3.41 (*)    Hemoglobin 11.2 (*)    HCT 34.2 (*)    MCV 100.3 (*)    RDW 16.7 (*)    All other components within normal limits  BASIC METABOLIC PANEL - Abnormal; Notable for the following:    Chloride 91 (*)    CO2 35 (*)    Glucose, Bld 190 (*)    BUN 45 (*)    Creatinine, Ser 2.74 (*)    GFR calc non Af Amer 18 (*)    GFR calc Af Amer 21 (*)    All other components within normal limits    Imaging Review No results found.   EKG Interpretation   Date/Time:  Sunday Oct 10 2014 15:31:13 EDT Ventricular Rate:  60 PR Interval:  277 QRS Duration: 119 QT Interval:  678 QTC Calculation: 678 R Axis:   42 Text Interpretation:  Atrial-paced rhythm Nonspecific intraventricular  conduction delay Low voltage, extremity leads Repol abnrm, global  ischemia, diffuse leads Confirmed by Camila Norville  MD, Niel Peretti ([id number]) on  10/10/2014 4:04:15 PM      MDM   Final diagnoses:  Dizziness  Vertigo    Patient is very unstable on his feet and  seems to be a fall risk and is are to phone multiple times at home with this vertigo. Because of his pacemaker he is unable to get an MRI and cannot get a CT angiogram with his current kidney function.  This seems to be peripheral given the on-and-off state that he is presenting with. He declines Valium and the Antivert does not seem to be working or puts him to sleep or makes him drowsy if he takes too much. I discussed with neuro, Dr. Janann Colonel, who recommends observation admission given fall risk as well as vestibular rehabilitation. Will admit to the hospitalist.    Sherwood Gambler, MD 10/10/14 [phone]

## 2014-10-10 NOTE — H&P (Signed)
[facility] History and Physical  [name] MNO:[id number] DOB: Nov 09, 1918 DOA: 10/10/2014  Referring physician: er PCP: [name], MD   Chief Complaint: dizziness  HPI: [name] is a 81 y.o. male  With PMHX of CHF and CKD.  He was recently in the hospital 5/23-5/24 for similar presentation of dizziness.  Plan was for him to go home with outpatient vestibular rehab.  He was feeling better upon discharge but then developed more dizziness and fell in his tub- denies loss of consciousness   He was d/c'd on 12.5 mg of antivert but this makes him sleepy.  ER physician stood patient up in room but he became very dizzy/unstable on feet.  Patient's son showed him some vestibular exercised and symptoms have since resolved.  Er doctor spoke with Dr. Janann Colonel (not formally consulted) who recommended observation and continued vestibular rehab.  Patient declined valium for treatment.    Patient had head CT during last hospitalization with no sign of acute CVA.  He has pacemaker and can not get MRI.    No fever, no chill, no N/V, no SOB, no CP  Review of Systems:  All systems reviewed, negative unless stated above    Past Medical History  Diagnosis Date  . Hyperlipidemia   . Spinal stenosis   . Anemia   . Carotid artery disease     a. s/p prior carotid endarterectomy.  [name] PVD (peripheral vascular disease)   . Hyponatremia     Chronic  . CVA (cerebral infarction)     a. When asked to clarify patient says he was told thumb numbness may be TIA. He does not recall formal stroke. CT head results are only available through [facility], not in [facility].  . CKD (chronic kidney disease) stage 4, GFR 15-29 ml/min   . Long term (current) use of anticoagulants   . PAF (paroxysmal atrial fibrillation)     a. Asymptomatic. On Eliquis. CHADSVASC 7.  . Sick sinus syndrome     With DDD St. Jude PM, initially placed in 1993 by Dr. Nils Pyle. Device upgrade 10/2002 to DDD, by Dr. Rollene Fare complicated by  bleeding. Subsequent gen change 2011.  [name] Hypertensive cardiovascular disease   . Chronic diastolic congestive heart failure     ECHO 05/20/12 LVEF estimated by 2D at 55-60%  . Arthritis   . Gait disorder   . Diabetic peripheral neuropathy associated with type 2 diabetes mellitus   . Diabetes mellitus     insulin dependent  . Elevated troponin I level 06/22/2014    a. 06/2014 felt due to demand ischemia.  . Chronic anticoagulation     For PAF, on Eliquis   . Urinary retention   . Pulmonary hypertension   . Foot drop, left 07/21/2014  . Asterixis 07/21/2014  . Hypertension   . Presence of permanent cardiac pacemaker   . Pneumonia     hx of pneumonia x 1    Past Surgical History  Procedure Laterality Date  . Lumbar laminectomy  1995  . Back surgery    . Total knee arthroplasty Left   . Quadriceps tendon repair    . Cataract extraction    . Carotid endarterectomy    . Yag laser application Right 0/38/3338    Procedure: YAG LASER CAPSULOTOMY OF RIGHT EYE;  Surgeon: [name], MD;  Location: [facility];  Service: Ophthalmology;  Laterality: Right;  . Tonsillectomy    . Pacemaker insertion      DDD St.  Jude PM, initially placed in 1993 by Dr. Nils Pyle. Device upgrade 10/2002 to DDD, by Dr. Rollene Fare complicated by bleeding.  . Carotid endarterectomy Right 2006  . Transurethral resection of prostate    . Cardioversion N/A 06/16/2013    Procedure: CARDIOVERSION BEDSIDE;  Surgeon: [name], MD;  Location: [facility];  Service: Cardiovascular;  Laterality: N/A;  . Pacemaker generator change  12/09/2009    SJM Accent DR RF gen change by Dr [name]  . Insert / replace / remove pacemaker      2004   . Transurethral resection of prostate N/A 08/27/2014    Procedure: TRANSURETHRAL RESECTION OF THE PROSTATE WITH GYRUS INSTRUMENTS;  Surgeon: [name], MD;  [facility];  Service: Urology;  Laterality: N/A;  . Cystoscopy N/A 08/27/2014    Procedure: CYSTOSCOPY;  Surgeon: [name], MD;   [facility];  Service: Urology;  Laterality: N/A;   Social History:  reports that he quit smoking about 56 years ago. His smoking use included Pipe and Cigarettes. He quit after 2 years of use. He has never used smokeless tobacco. He reports that he does not drink alcohol or use illicit drugs.  Allergies  Allergen Reactions  . Statins Other (See Comments)    rhabdomyolisis  . Aggrenox [Aspirin-Dipyridamole Er] Other (See Comments)    Dizziness  . Carvedilol Other (See Comments)    Dizziness  . Claritin [Loratadine] Other (See Comments)    Dizziness & drowsiness   . Cymbalta [Duloxetine Hcl]     Confusion   . Mucinex [Guaifenesin Er] Other (See Comments)    Dizziness, drowsiness  . Phenergan [Promethazine Hcl] Other (See Comments)    Causes severe confusion  . Plavix [Clopidogrel Bisulfate] Other (See Comments)    Dizziness  . Rapaflo [Silodosin] Other (See Comments)    Dizziness for about 30- 45  Minutes At time of preop appointment on 08/26/2014 patient denies any problems with this medication  . Travatan Z [Travoprost] Other (See Comments)    Eye irritation    Family History  Problem Relation Age of Onset  . Multiple myeloma Father   . Hypertension Mother   . Healthy Sister   . COPD Sister   . Heart failure Sister      Prior to Admission medications   Medication Sig Start Date End Date Taking? Authorizing Provider  allopurinol (ZYLOPRIM) 100 MG tablet Take 100 mg by mouth 2 (two) times daily.     Historical Provider, MD  amiodarone (PACERONE) 200 MG tablet Take 1 tablet (200 mg total) by mouth at bedtime. 09/17/14   Belva Crome, MD  apixaban (ELIQUIS) 2.5 MG TABS tablet Take 2.5 mg by mouth 2 (two) times daily.    Historical Provider, MD  Carboxymethylcellul-Glycerin 0.5-0.9 % SOLN Place 1 drop into both eyes 3 (three) times daily as needed (dry eyes).    Historical Provider, MD  carboxymethylcellulose 1 % ophthalmic solution Place 1 drop into both eyes at bedtime.     Historical Provider, MD  ferrous sulfate 325 (65 FE) MG tablet Take 325 mg by mouth every other day.    Historical Provider, MD  fexofenadine (ALLEGRA) 180 MG tablet Take 180 mg by mouth at bedtime.     Historical Provider, MD  insulin aspart (NOVOLOG FLEXPEN) 100 UNIT/ML FlexPen Inject 0-10 Units into the skin 3 (three) times daily with meals. Per sliding scale:  CBG <175 no insulin (unless consuming large amounts of carbs; 6-10 units for CBG >175 based  on actual CBG and number of carbs in meal; >325 call MD    Historical Provider, MD  insulin glargine (LANTUS) 100 unit/mL SOPN Inject 10 Units into the skin at bedtime.    Historical Provider, MD  isosorbide mononitrate (IMDUR) 60 MG 24 hr tablet Take 1 tablet (60 mg total) by mouth daily. 07/14/14   Belva Crome, MD  meclizine (ANTIVERT) 12.5 MG tablet Take 1 tablet (12.5 mg total) by mouth 3 (three) times daily as needed for dizziness. 10/05/14   Maryann Mikhail, DO  metolazone (ZAROXOLYN) 2.5 MG tablet Take 1 tablet (2.5 mg total) by mouth daily as needed (take 30 min prior to Demadex, if weight goes up 3 pounds from dry weigth or develops shortness of breath). Patient taking differently: Take 2.5 mg by mouth daily as needed (take 30 min prior to Demadex, if weight is 178 or over).  09/08/14   Debbe Odea, MD  metoprolol tartrate (LOPRESSOR) 25 MG tablet Take 1 tablet (25 mg total) by mouth daily. 06/21/14   Belva Crome, MD  nitroGLYCERIN (NITROSTAT) 0.4 MG SL tablet Place 1 tablet (0.4 mg total) under the tongue every 5 (five) minutes as needed for chest pain. 12/03/13   Belva Crome, MD  OVER THE COUNTER MEDICATION Take 1 packet by mouth daily. Nature Made Diabetic Pak (6 tablet combo)    Historical Provider, MD  polyethylene glycol (MIRALAX / GLYCOLAX) packet Take 17 g by mouth 2 (two) times daily.     Historical Provider, MD  potassium chloride SA (K-DUR,KLOR-CON) 20 MEQ tablet Take 1 tablet (20 mEq total) by mouth daily. 09/08/14   Debbe Odea,  MD  Tafluprost 0.0015 % SOLN Place 1 drop into both eyes at bedtime. [place] Provider, MD  torsemide (DEMADEX) 20 MG tablet Take 4 tablets (80 mg total) by mouth daily. 07/03/14   Eileen Stanford, PA-C   Physical Exam: Filed Vitals:   10/10/14 1545 10/10/14 1600 10/10/14 1602 10/10/14 1630  BP:   121/61 126/62  Pulse:   64 60  Temp:  97.6 F (36.4 C)    Resp: _0 SpO2:   100% 96%    Wt Readings from Last 3 Encounters:  10/05/14 80.332 kg (177 lb 1.6 oz)  09/30/14 80.377 kg (177 lb 3.2 oz)  09/17/14 79.425 kg (175 lb 1.6 oz)    General:  Appears calm and comfortable Eyes: PERRL, normal lids, irises & conjunctiva ENT: hard of hearing Neck: no LAD, masses or thyromegaly Cardiovascular: RRR, no m/r/g. No LE edema. Respiratory: CTA bilaterally, no w/r/r. Normal respiratory effort. Abdomen: soft, ntnd Skin: no rash or induration seen on limited exam Musculoskeletal: grossly normal tone BUE/BLE Psychiatric: grossly normal mood and affect, speech fluent and appropriate Neurologic: grossly non-focal.          Labs on Admission:  Basic Metabolic Panel:  Recent Labs Lab 10/04/14 1116 10/05/14 0826 10/10/14 1545  NA 135 138 136  K 3.1* 3.6 3.5  CL 87* 91* 91*  CO2 36* 34* 35*  GLUCOSE 301* 165* 190*  BUN 48* 44* 45*  CREATININE 2.62* 2.27* 2.74*  CALCIUM 9.3 9.7 9.0   Liver Function Tests:  Recent Labs Lab 10/04/14 1116  AST 54*  ALT 36  ALKPHOS 59  BILITOT 0.7  PROT 6.4*  ALBUMIN 3.1*   No results for input(s): LIPASE, AMYLASE in the last 168 hours. No results for input(s): AMMONIA in the last 168 hours. CBC:  Recent Labs Lab 10/04/14 1116 10/05/14 0826 10/10/14 1545  WBC 6.0 6.6 6.1  NEUTROABS 4.0  --  4.0  HGB 11.4* 12.4* 11.2*  HCT 35.1* 37.1* 34.2*  MCV 99.4 99.2 100.3*  PLT 174 190 173   Cardiac Enzymes: No results for input(s): CKTOTAL, CKMB, CKMBINDEX, TROPONINI in the last 168 hours.  BNP (last 3 results)  Recent  Labs  07/23/14 1827 09/04/14 1439 10/04/14 1241  BNP 359.1* 1383.6* 467.9*    ProBNP (last 3 results)  Recent Labs  12/23/13 0535 04/04/14 0742 07/16/14 1213  PROBNP 4564.0* 4874.0* 460.0*    CBG:  Recent Labs Lab 10/04/14 1349 10/04/14 1626 10/04/14 2102 10/05/14 0703 10/05/14 1121  GLUCAP 240* 167* 208* 163* 256*    Radiological Exams on Admission: No results found.    Assessment/Plan Active Problems:   Type 2 diabetes mellitus with peripheral neuropathy   CKD (chronic kidney disease) stage 4, GFR 15-29 ml/min   Dizziness   Dizziness with known vertigo- care management consult to ensure vestibular rehab has been set up, PT eval, patient will continue working on exercises in the bed, orthostatics as well in the AM, low threshold to CT Scan if patient developes any neuro symptoms as he is on a blood thinner and fell  DM- SSI, lantus  CHF- appears compensated- continue home meds  CKD- Cr stable  Suspect patient can be d/c'd in AM   Code Status: full DVT Prophylaxis: Family Communication: son at bedside Disposition Plan:   Time spent: 76 min  Eulogio Bear [facility] Pager [phone]

## 2014-10-10 NOTE — ED Notes (Signed)
Per EMS: Pt discharged from hospital recently with a dx of Vertigo. Pt scheduled for follow up appt on Tuesday. Pt feeling well at rest, experiencing vertigo upon standing. Unable to safely ambulate. Hx. Pacemaker, CHF, DM. Pt a/ox4.

## 2014-10-11 DIAGNOSIS — Z950 Presence of cardiac pacemaker: Secondary | ICD-10-CM

## 2014-10-11 DIAGNOSIS — R42 Dizziness and giddiness: Secondary | ICD-10-CM | POA: Diagnosis not present

## 2014-10-11 DIAGNOSIS — E1142 Type 2 diabetes mellitus with diabetic polyneuropathy: Secondary | ICD-10-CM | POA: Diagnosis not present

## 2014-10-11 DIAGNOSIS — I6310 Cerebral infarction due to embolism of unspecified precerebral artery: Secondary | ICD-10-CM

## 2014-10-11 DIAGNOSIS — E1140 Type 2 diabetes mellitus with diabetic neuropathy, unspecified: Secondary | ICD-10-CM | POA: Diagnosis not present

## 2014-10-11 DIAGNOSIS — N184 Chronic kidney disease, stage 4 (severe): Secondary | ICD-10-CM | POA: Diagnosis not present

## 2014-10-11 LAB — CBC
HCT: 33.8 % — ABNORMAL LOW (ref 39.0–52.0)
Hemoglobin: 11.1 g/dL — ABNORMAL LOW (ref 13.0–17.0)
MCH: 32.6 pg (ref 26.0–34.0)
MCHC: 32.8 g/dL (ref 30.0–36.0)
MCV: 99.1 fL (ref 78.0–100.0)
Platelets: 185 10*3/uL (ref 150–400)
RBC: 3.41 MIL/uL — ABNORMAL LOW (ref 4.22–5.81)
RDW: 16.7 % — ABNORMAL HIGH (ref 11.5–15.5)
WBC: 6.2 10*3/uL (ref 4.0–10.5)

## 2014-10-11 LAB — BASIC METABOLIC PANEL
Anion gap: 8 (ref 5–15)
BUN: 41 mg/dL — ABNORMAL HIGH (ref 6–20)
CO2: 36 mmol/L — ABNORMAL HIGH (ref 22–32)
Calcium: 8.9 mg/dL (ref 8.9–10.3)
Chloride: 93 mmol/L — ABNORMAL LOW (ref 101–111)
Creatinine, Ser: 2.42 mg/dL — ABNORMAL HIGH (ref 0.61–1.24)
GFR calc Af Amer: 25 mL/min — ABNORMAL LOW (ref >60)
GFR calc non Af Amer: 21 mL/min — ABNORMAL LOW (ref >60)
Glucose, Bld: 123 mg/dL — ABNORMAL HIGH (ref 65–99)
Potassium: 3.4 mmol/L — ABNORMAL LOW (ref 3.5–5.1)
Sodium: 137 mmol/L (ref 135–145)

## 2014-10-11 LAB — GLUCOSE, CAPILLARY
Glucose-Capillary: 97 mg/dL (ref 65–99)
Glucose-Capillary: 267 mg/dL — ABNORMAL HIGH (ref 65–99)

## 2014-10-11 NOTE — Consult Note (Signed)
error 

## 2014-10-11 NOTE — Care Management Note (Signed)
Case Management Note  Patient Details  Name: [name] MRN: [id number] Date of Birth: 1918/05/18  Subjective/Objective:     Patient is from home.  Admitted with Diziness.              Action/Plan: D/C home with Home health PT with vestibular therapy.  Expected Discharge Date:  10/11/14  Now   10/12/14           Expected Discharge Plan:  [facility]  In-House Referral:     Discharge planning Services  CM Consult  Post Acute Care Choice:  Home Health Choice offered to:  Patient [facility] - [name] was in room and says patient uses [place] and want the same.)  DME Arranged:    DME Agency:     HH Arranged:  PT (with vestiblular therapy called to [place] [facility] Agency:  [place]  Status of Service:  In process, will continue to follow  Medicare Important Message Given:    Date Medicare IM Given:    Medicare IM give by:    Date Additional Medicare IM Given:    Additional Medicare Important Message give by:     If discussed at [place] of Stay Meetings, dates discussed:    Additional Comments:  Dimas Aguas, RN 10/11/2014, 12:08 PM

## 2014-10-11 NOTE — Progress Notes (Signed)
10/11/14  1320   CM discussed order with Dr Markus Jarvis, it was confirmed patient will need outpatient Pt which MD will write the script.  MD wrote order for script for patient's discharge.  CM talked to [place] [facility] and order for Home Health PT has been cancelled.

## 2014-10-11 NOTE — Evaluation (Signed)
Physical Therapy Evaluation Patient Details Name: [name] MRN: [id number] DOB: February 05, 1919 Today's Date: 10/11/2014   History of Present Illness  The patient is a 81 y.o. year-old male with history of chronic diastolic heart failure followed closely by Dr. Pernell Dupre, paroxysmal atrial fibrillation on amiodarone, CKD, hypertension, chronic anticoagulation who presents with vertigo and fall at home after same presentation last week.  Clinical Impression  Pt very pleasant and receptive to education and positioning. Pt with positive 5 beats of nystagmus with Right Marye Round, negative to the left. Performed Epley maneuver to treat pt BPPV with handout given for how to perform at home with caregiver. Pt with initial spinning with right dix hallpike and rolling to left but then cleared with all mobility. Pt educated for posture, mobility and vestibular education. Pt near baseline functional level and continue to recommend OPPT vestibular as recommended at D/C last week. Will follow acutely to maximize independence and vestibular function to decrease burden of care and fall risk.     Follow Up Recommendations Outpatient PT    Equipment Recommendations  None recommended by PT    Recommendations for Other Services       Precautions / Restrictions Precautions Precautions: Fall Required Braces or Orthoses: Spinal Brace Spinal Brace: Lumbar corset Other Brace/Splint: Left LE brace built in shoe, pt long term wearing lumbar corset Restrictions Weight Bearing Restrictions: No      Mobility  Bed Mobility Overal bed mobility: Needs Assistance Bed Mobility: Supine to Sit;Sit to Supine     Supine to sit: Min assist Sit to supine: Min assist   General bed mobility comments: cues for sequence with assist from supine to long sitting for vestibular testing  Transfers     Transfers: Sit to/from Stand Sit to Stand: Supervision         General transfer comment: cues for hand  placement and safety  Ambulation/Gait Ambulation/Gait assistance: Min guard Ambulation Distance (Feet): 120 Feet Assistive device: Rolling walker (2 wheeled) Gait Pattern/deviations: Step-through pattern;Decreased stride length;Trunk flexed   Gait velocity interpretation: Below normal speed for age/gender General Gait Details: cues for posture, looking up and stepping into RW  Stairs            Wheelchair Mobility    Modified Rankin (Stroke Patients Only)       Balance Overall balance assessment: Needs assistance   Sitting balance-Leahy Scale: Fair       Standing balance-Leahy Scale: Poor                               Pertinent Vitals/Pain Pain Assessment: No/denies pain    Home Living Family/patient expects to be discharged to:: Private residence Living Arrangements: Children Available Help at Discharge: Personal care attendant;Available 24 hours/day;Family Type of Home: House Home Access: Stairs to enter Entrance Stairs-Rails: Right;Left;Can reach both Entrance Stairs-Number of Steps: 3 Home Layout: One level Home Equipment: Walker - 4 wheels;Tub bench;Hand held shower head;Walker - 2 wheels;Adaptive equipment      Prior Function     Gait / Transfers Assistance Needed: Ambulated with rollator and would sit prn when legs got tired.  Walked short distances with the caregiver and son  ADL's / Homemaking Assistance Needed: assisted for tub bench transfer, to wash feet and back, for LB dressing, meal prep and housekeeping. Pt routinely sits to perform grooming on his rollator.  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     Hand Dominance        Extremity/Trunk Assessment   Upper Extremity Assessment: Generalized weakness           Lower Extremity Assessment: Generalized weakness      Cervical / Trunk Assessment: Kyphotic  Communication   Communication: HOH  Cognition  Arousal/Alertness: Awake/alert Behavior During Therapy: WFL for tasks assessed/performed Overall Cognitive Status: Within Functional Limits for tasks assessed                      General Comments      Exercises General Exercises - Lower Extremity Long Arc Quad: AROM;Seated;Both;5 reps      Assessment/Plan    PT Assessment Patient needs continued PT services  PT Diagnosis Generalized weakness;Difficulty walking   PT Problem List Decreased strength;Decreased balance  PT Treatment Interventions Gait training;DME instruction;Stair training;Patient/family education;Other (comment) (vestibular therapy and canalith repositioning)   PT Goals (Current goals can be found in the Care Plan section) Acute Rehab PT Goals Patient Stated Goal: to go home PT Goal Formulation: With patient Time For Goal Achievement: 10/18/14 Potential to Achieve Goals: Good    Frequency Min 3X/week   Barriers to discharge        Co-evaluation               End of Session Equipment Utilized During Treatment: Back brace;Other (comment) (Left AFO) Activity Tolerance: Patient tolerated treatment well Patient left: in chair;with call bell/phone within reach;with family/visitor present Nurse Communication: Mobility status    Functional Assessment Tool Used: clinical judgment Functional Limitation: Mobility: Walking and moving around Mobility: Walking and Moving Around Current Status (O8416): At least 1 percent but less than 20 percent impaired, limited or restricted Mobility: Walking and Moving Around Goal Status [phone]): At least 1 percent but less than 20 percent impaired, limited or restricted    Time: 1040-1114 PT Time Calculation (min) (ACUTE ONLY): 34 min   Charges:   PT Evaluation $Initial PT Evaluation Tier I: 1 Procedure PT Treatments $Canalith Rep Proc: 8-22 mins   PT G Codes:   PT G-Codes **NOT FOR INPATIENT CLASS** Functional Assessment Tool Used: clinical  judgment Functional Limitation: Mobility: Walking and moving around Mobility: Walking and Moving Around Current Status (Z6010): At least 1 percent but less than 20 percent impaired, limited or restricted Mobility: Walking and Moving Around Goal Status [phone]): At least 1 percent but less than 20 percent impaired, limited or restricted    Melford Aase 10/11/2014, 11:28 AM Elwyn Reach, [place]

## 2014-10-11 NOTE — Discharge Summary (Signed)
Physician Discharge Summary  [name] LTJ:[id number] DOB: [birth year] DOA: 10/10/2014  PCP: [name], MD  Admit date: 10/10/2014 Discharge date: 10/11/2014  Time spent: 30 minutes  Recommendations for Outpatient Follow-up:  1. Needs close follow up with Cardiologist [name] 2. Consider [facility] referral Revatio vs sildenafil for 2/2 pulm htn 3. No changes made to meds  4. 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 He carries a diagnosis as well of chronic kidney disease stage IV, P A. fib ChadVasc score~5 on chronic anticoagulation S/P DC CV 06/16/13 + PPM St. Jude's placed in the 1990s, replaced 2004 and then again 2011   for indication  sick sinus syndrome, chronic diastolic heart failure, prior embolic CVA, TY2 DM, pulmonary nodule 2015   His baseline dry weight is 175 pounds He is slowly recovering from an apparent prostatectomy and acute renal failure back in April 2016  Prior to that he was admitted 07/2014 with acute respiratory failure  Over the past year to year and a half he has been admitted multiple times for either renal insufficiency or acute decompensation of chronic diastolic heart failure  His cardiologist [name] manages him closely  in either event, patient sustained an episode of dizziness and somewhat ataxic which lasted a couple of seconds 5/28 while he was standing in his restroom but fell into the tub. He did not sustain any specific injury or head trauma but eventually came to the emergency room for  workup  His PPM was interrogated and results were wnl  PT reviewed the patient and felt he was stable on his feet and multiple exercises were given to patient  He was hemodynamically stable for discharge home   Discharge Exam: Filed Vitals:   10/11/14 1153  BP: 125/75  Pulse: 68  Temp: 97.8 F (36.6 C)  Resp: 20    General: alert pleasant Cardiovascular:  s1 s 2no m/r/g Respiratory: clear  Discharge Instructions   Discharge Instructions    Diet - low sodium heart healthy    Complete by:  As directed      Discharge instructions    Complete by:  As directed   Follow closely with [name] Would consider [facility] work-up as has mod-severe pulm Htn Get therapy at outpatient rehab facility for your     Increase activity slowly    Complete by:  As directed           Current Discharge Medication List    CONTINUE these medications which have NOT CHANGED   Details  allopurinol (ZYLOPRIM) 100 MG tablet Take 100 mg by mouth 2 (two) times daily.     amiodarone (PACERONE) 200 MG tablet Take 1 tablet (200 mg total) by mouth at bedtime. Qty: 30 tablet, Refills: 6    apixaban (ELIQUIS) 2.5 MG TABS tablet Take 2.5 mg by mouth 2 (two) times daily.    Carboxymethylcellul-Glycerin 0.5-0.9 % SOLN Place 1 drop into both eyes 3 (three) times daily as needed (dry eyes).    ferrous sulfate  325 (65 FE) MG tablet Take 325 mg by mouth every other day.    fexofenadine (ALLEGRA) 180 MG tablet Take 180 mg by mouth at bedtime.     insulin aspart (NOVOLOG FLEXPEN) 100 UNIT/ML FlexPen Inject 0-10 Units into the skin 3 (three) times daily with meals. Per sliding scale:  CBG <175 no insulin (unless consuming large amounts of carbs; 6-10 units for CBG >175 based on actual CBG and number of carbs in meal; >325 call MD    insulin glargine (LANTUS) 100 unit/mL SOPN Inject 10 Units into the skin at bedtime.    isosorbide mononitrate (IMDUR) 60 MG 24 hr tablet Take 1 tablet (60 mg total)  by mouth daily. Qty: 30 tablet, Refills: 5    meclizine (ANTIVERT) 12.5 MG tablet Take 1 tablet (12.5 mg total) by mouth 3 (three) times daily as needed for dizziness. Qty: 30 tablet, Refills: 0    metoprolol tartrate (LOPRESSOR) 25 MG tablet Take 1 tablet (25 mg total) by mouth daily. Qty: 30 tablet, Refills: 6    OVER THE COUNTER MEDICATION Take 1 packet by mouth daily. Nature Made Diabetic Pak (6 tablet combo)    polyethylene glycol (MIRALAX / GLYCOLAX) packet Take 17 g by mouth 2 (two) times daily.     potassium chloride SA (K-DUR,KLOR-CON) 20 MEQ tablet Take 1 tablet (20 mEq total) by mouth daily. Qty: 30 tablet, Refills: 6    Tafluprost 0.0015 % SOLN Place 1 drop into both eyes at bedtime. ZIOPTAN    torsemide (DEMADEX) 20 MG tablet Take 4 tablets (80 mg total) by mouth daily. Qty: 120 tablet, Refills: 3    metolazone (ZAROXOLYN) 2.5 MG tablet Take 1 tablet (2.5 mg total) by mouth daily as needed (take 30 min prior to Demadex, if weight goes up 3 pounds from dry weigth or develops shortness of breath). Qty: 30 tablet, Refills: 0    nitroGLYCERIN (NITROSTAT) 0.4 MG SL tablet Place 1 tablet (0.4 mg total) under the tongue every 5 (five) minutes as needed for chest pain. Qty: 25 tablet, Refills: 3   Associated Diagnoses: Angina pectoris       Allergies  Allergen Reactions  . Statins Other (See Comments)    rhabdomyolisis  . Aggrenox [Aspirin-Dipyridamole Er] Other (See Comments)    Dizziness  . Carvedilol Other (See Comments)    Dizziness  . Claritin [Loratadine] Other (See Comments)    Dizziness & drowsiness   . Cymbalta [Duloxetine Hcl]     Confusion   . Mucinex [Guaifenesin Er] Other (See Comments)    Dizziness, drowsiness  . Phenergan [Promethazine Hcl] Other (See Comments)    Causes severe confusion  . Plavix [Clopidogrel Bisulfate] Other (See Comments)    Dizziness  . Rapaflo [Silodosin] Other (See Comments)    Dizziness for about 30- 45  Minutes At time of  preop appointment on 08/26/2014 patient denies any problems with this medication  . Travatan Z [Travoprost] Other (See Comments)    Eye irritation      The results of significant diagnostics from this hospitalization (including imaging, microbiology, ancillary and laboratory) are listed below for reference.    Significant Diagnostic Studies: Dg Chest 2 View  10/04/2014   CLINICAL DATA:  Dizziness  EXAM: CHEST  2 VIEW  COMPARISON:  September 04, 2014  FINDINGS: There is stable cardiomegaly. The pulmonary vascularity is normal. There is no appreciable edema or consolidation. Pacemaker leads are attached to the right atrium and right ventricle. No adenopathy. There is mild  degenerative change in the thoracic spine and both shoulders.  IMPRESSION: Cardiomegaly.  No edema or consolidation.   Electronically Signed   By: Lowella Grip III M.D.   On: 10/04/2014 13:52   Ct Head Wo Contrast  10/04/2014   CLINICAL DATA:  Vertigo for several days  EXAM: CT HEAD WITHOUT CONTRAST  TECHNIQUE: Contiguous axial images were obtained from the base of the skull through the vertex without intravenous contrast.  COMPARISON:  March 10, 2012  FINDINGS: Moderate diffuse atrophy is stable. There is no intracranial mass, hemorrhage, extra-axial fluid collection, or midline shift. There is mild small vessel disease in the centra semiovale bilaterally, slightly more on the left than on the right, stable. No new gray-white compartment lesion identified. No acute infarct apparent. Basal ganglia calcification is felt to be physiologic in this age group. The bony calvarium appears intact. The mastoid air cells clear.  IMPRESSION: Atrophy with periventricular small vessel disease, stable. No intracranial mass, hemorrhage, or acute appearing infarct. Mastoid air cells are clear.   Electronically Signed   By: Lowella Grip III M.D.   On: 10/04/2014 13:44    Microbiology: No results found for this or any previous visit (from the  past 240 hour(s)).   Labs: Basic Metabolic Panel:  Recent Labs Lab 10/05/14 0826 10/10/14 1545 10/11/14 0513  NA 138 136 137  K 3.6 3.5 3.4*  CL 91* 91* 93*  CO2 34* 35* 36*  GLUCOSE 165* 190* 123*  BUN 44* 45* 41*  CREATININE 2.27* 2.74* 2.42*  CALCIUM 9.7 9.0 8.9   Liver Function Tests: No results for input(s): AST, ALT, ALKPHOS, BILITOT, PROT, ALBUMIN in the last 168 hours. No results for input(s): LIPASE, AMYLASE in the last 168 hours. No results for input(s): AMMONIA in the last 168 hours. CBC:  Recent Labs Lab 10/05/14 0826 10/10/14 1545 10/11/14 0513  WBC 6.6 6.1 6.2  NEUTROABS  --  4.0  --   HGB 12.4* 11.2* 11.1*  HCT 37.1* 34.2* 33.8*  MCV 99.2 100.3* 99.1  PLT 190 173 185   Cardiac Enzymes: No results for input(s): CKTOTAL, CKMB, CKMBINDEX, TROPONINI in the last 168 hours. BNP: BNP (last 3 results)  Recent Labs  07/23/14 1827 09/04/14 1439 10/04/14 1241  BNP 359.1* 1383.6* 467.9*    ProBNP (last 3 results)  Recent Labs  12/23/13 0535 04/04/14 0742 07/16/14 1213  PROBNP 4564.0* 4874.0* 460.0*    CBG:  Recent Labs Lab 10/05/14 1121 10/10/14 1851 10/10/14 2139 10/11/14 0614 10/11/14 1126  GLUCAP 256* 91 167* 97 267*       Signed:  Coltan Spinello, JAI-GURMUKH  [facility] 10/11/2014, 1:21 PM

## 2014-10-11 NOTE — Progress Notes (Signed)
10/11/14  11:55 AM  CM talked to patient and his home sitter - [name] about his HHPT order.  Patient asked his home sitter to provide information about his previous [place].  [name] says member uses [facility] and they want the same care.  CM telephone Jeannetta Nap of [facility] at [phone] to arrange home PT with vestibular care for patient.

## 2014-10-11 NOTE — Progress Notes (Signed)
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 He carries a diagnosis as well of chronic kidney disease stage IV, P A. fib ChadVasc score~5 on chronic anticoagulation S/P DC CV 06/16/13 + PPM St. Jude's placed in the 1990s, replaced 2004 and then again 2011   for indication  sick sinus syndrome, chronic diastolic heart failure, prior embolic CVA, TY2 DM, pulmonary nodule 2015  His baseline dry weight is 175 pounds He is slowly recovering from an apparent prostatectomy and acute renal failure back in April 2016 Prior to that he was admitted 07/2014 with acute respiratory failure Over the past year to year and a half he has been admitted multiple times for either renal insufficiency or acute decompensation of chronic diastolic heart failure  His cardiologist [name] manages him closely  in either event, patient sustained an episode of dizziness and somewhat ataxic which lasted a couple of seconds 5/28 while he was standing in his restroom but fell into the tub. He did not sustain any specific injury or head trauma but eventually came to the emergency room for workup and  Past medical history-As per Problem list Chart reviewed as below- reviewed  Consultants:  cardiology  Procedures:  pacemaker interrogation   Antibiotics:  none   Subjective  Alert pleasant oriented in nad No cp/n/v/sob tol diet  denies any dietary indiscretion, denies any excess salt or water intake Has a nurse that comes in and is usually very meticulous about his diabetes, fluid, salt intake   Objective    Interim History:   Telemetry: Paced rythmn with episodic non capture noted   Objective: Filed Vitals:   10/10/14 2158 10/11/14 0110 10/11/14 0600 10/11/14 0602  BP: 131/75 129/63 126/60   Pulse: 68 64 68   Temp: 97.9 F (36.6 C) 97.3 F  (36.3 C) 97.2 F (36.2 C)   TempSrc: Oral Oral Oral   Resp: 18 20 18    Height:      Weight:    81.92 kg (180 lb 9.6 oz)  SpO2: 100% 99% 96%     Intake/Output Summary (Last 24 hours) at 10/11/14 0900 Last data filed at 10/11/14 0837  Gross per 24 hour  Intake    240 ml  Output    600 ml  Net   -360 ml    Exam:  General: eomi, ncat no scleral ict, no pallor Cardiovascular:  s1 s2 no grd 2/6 LUSE M.  PPM site clearn Respiratory: clear no added sound Abdomen: soft nt/nd Skin: no le edema Neuro intact  Data Reviewed: Basic Metabolic Panel:  Recent Labs Lab 10/04/14 1116 10/05/14 0826 10/10/14 1545 10/11/14 0513  NA 135 138 136 137  K 3.1* 3.6 3.5 3.4*  CL 87* 91* 91* 93*  CO2 36* 34* 35* 36*  GLUCOSE 301* 165* 190* 123*  BUN 48* 44* 45* 41*  CREATININE 2.62* 2.27* 2.74* 2.42*  CALCIUM 9.3 9.7 9.0 8.9   Liver Function Tests:  Recent Labs Lab 10/04/14 1116  AST 54*  ALT 36  ALKPHOS 59  BILITOT 0.7  PROT 6.4*  ALBUMIN 3.1*   No results for input(s): LIPASE, AMYLASE in the last 168 hours. No results for input(s): AMMONIA in the last 168 hours. CBC:  Recent Labs Lab 10/04/14 1116 10/05/14 0826 10/10/14 1545 10/11/14 0513  WBC 6.0 6.6 6.1 6.2  NEUTROABS 4.0  --  4.0  --  HGB 11.4* 12.4* 11.2* 11.1*  HCT 35.1* 37.1* 34.2* 33.8*  MCV 99.4 99.2 100.3* 99.1  PLT 174 190 173 185   Cardiac Enzymes: No results for input(s): CKTOTAL, CKMB, CKMBINDEX, TROPONINI in the last 168 hours. BNP: Invalid input(s): POCBNP CBG:  Recent Labs Lab 10/05/14 0703 10/05/14 1121 10/10/14 1851 10/10/14 2139 10/11/14 0614  GLUCAP 163* 256* 91 167* 97    No results found for this or any previous visit (from the past 240 hour(s)).   Studies:              All Imaging reviewed and is as per above notation   Scheduled Meds: . allopurinol  100 mg Oral BID  . amiodarone  200 mg Oral QHS  . apixaban  2.5 mg Oral BID  . ferrous sulfate  325 mg Oral QODAY  .  insulin aspart  0-5 Units Subcutaneous QHS  . insulin aspart  0-9 Units Subcutaneous TID WC  . insulin glargine  10 Units Subcutaneous QHS  . isosorbide mononitrate  60 mg Oral Daily  . metoprolol tartrate  25 mg Oral Daily  . polyethylene glycol  17 g Oral BID  . potassium chloride SA  20 mEq Oral Daily  . sodium chloride  3 mL Intravenous Q12H  . torsemide  80 mg Oral Daily   Continuous Infusions:    Assessment/Plan: 1. multifactorial vertigo-probably secondary to his BPPV. His pacer shows non-capture on telemetry therefore I will have this interrogated. He is not orthostatic and on last admission 5/24.  I'm not sure if he ever got therapy as an outpatient and this may need to be monitoredonce he returns home if he decides to do this. Therapist will see him today for vestibular rehabilitation and reinforce exercises that he has been trying to do at home.continue meclizine 12.5 3 times a day 2. compensated chronic bimodal heart failure-continue home meds of torsemide 80 daily, Imdur 60 daily, metoprolol 25 daily, amiodarone 200 daily at bedtime.  I will continue these meds for now without adjustement-Cardiology consulted for recommendations 3. PAfib +SSS s/p PPm St jude's-continue Xarelto-Fall risk needs to be discusses with the patientby cardiology regarding risks versus benefits 4. type 2 diabetes mellitus, A1c 8.2 07/2014-blood sugars well controlled 97-167. Will continue Lantus 10, sensitive coverage plus daily at bedtime coverage. 5. BPH S/P TUR 08/2014-outpatient follow-up Dr. Janice Norrie 6. CK D stage III to 4-tenuous balance with heart failure. Has cardiorenal syndrome. Unclear if he would want dialysis going forward. Not a candidate for ace/ARB. Continue 1.2 L fluid restriction. 7. prior CVA embolic-stable at present time. On anticoagulation 8. gout is controlled 9. Pulmonary nodule noted 2015-no further workup  Code Status: full Family Communication: discussed with caregiver at the  bedside Disposition Plan: inpatient pending evaluation by cardiology, physical therapy. May need skilled nursing care if cannot continue to do for himself   Verneita Griffes, MD  [facility] Pager [phone] 10/11/2014, 9:00 AM

## 2014-10-13 ENCOUNTER — Ambulatory Visit: Payer: Medicare Other | Admitting: Physical Therapy

## 2014-10-14 ENCOUNTER — Ambulatory Visit (INDEPENDENT_AMBULATORY_CARE_PROVIDER_SITE_OTHER): Payer: Medicare Other | Admitting: Interventional Cardiology

## 2014-10-14 ENCOUNTER — Encounter: Payer: Self-pay | Admitting: Interventional Cardiology

## 2014-10-14 VITALS — BP 140/76 | HR 65 | Ht 68.0 in | Wt 176.4 lb

## 2014-10-14 DIAGNOSIS — I208 Other forms of angina pectoris: Secondary | ICD-10-CM

## 2014-10-14 DIAGNOSIS — Z7901 Long term (current) use of anticoagulants: Secondary | ICD-10-CM | POA: Diagnosis not present

## 2014-10-14 DIAGNOSIS — I5032 Chronic diastolic (congestive) heart failure: Secondary | ICD-10-CM | POA: Diagnosis not present

## 2014-10-14 DIAGNOSIS — I10 Essential (primary) hypertension: Secondary | ICD-10-CM

## 2014-10-14 DIAGNOSIS — I480 Paroxysmal atrial fibrillation: Secondary | ICD-10-CM

## 2014-10-14 DIAGNOSIS — N184 Chronic kidney disease, stage 4 (severe): Secondary | ICD-10-CM | POA: Diagnosis not present

## 2014-10-14 DIAGNOSIS — Z79899 Other long term (current) drug therapy: Secondary | ICD-10-CM

## 2014-10-14 DIAGNOSIS — Z950 Presence of cardiac pacemaker: Secondary | ICD-10-CM

## 2014-10-14 LAB — BASIC METABOLIC PANEL
BUN: 44 mg/dL — ABNORMAL HIGH (ref 6–23)
CALCIUM: 9.3 mg/dL (ref 8.4–10.5)
CO2: 37 meq/L — AB (ref 19–32)
CREATININE: 2.73 mg/dL — AB (ref 0.40–1.50)
Chloride: 93 mEq/L — ABNORMAL LOW (ref 96–112)
GFR: 28.07 mL/min — ABNORMAL LOW (ref >60.00)
Glucose, Bld: 254 mg/dL — ABNORMAL HIGH (ref 70–99)
Potassium: 3.7 mEq/L (ref 3.5–5.1)
Sodium: 134 mEq/L — ABNORMAL LOW (ref 135–145)

## 2014-10-14 LAB — MAGNESIUM: Magnesium: 2.4 mg/dL (ref 1.5–2.5)

## 2014-10-14 NOTE — Patient Instructions (Addendum)
Medication Instructions:  Your physician recommends that you continue on your current medications as directed. Please refer to the Current Medication list given to you today.   Labwork: bmet today  Testing/Procedures: None   Follow-Up: You have a follow up appointment scheduled on 11/03/14 @ 3:30pm  Any Other Special Instructions Will Be Listed Below (If Applicable).

## 2014-10-14 NOTE — Progress Notes (Signed)
Cardiology Office Note   Date:  10/14/2014   ID:  Darrell Mulder, MD, DOB 12/01/1918, MRN [id number]  PCP:  [name], MD  Cardiologist:  [name], MD   Chief Complaint  Patient presents with  . 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 He's been to the hospital twice for this. With reference to heart failure complaints, he is done well. He's been able to maintain a weight under 178 pounds at home with intermittent use of metolazone. He denies angina. He denies orthopnea. There is no peripheral edema. Appetite is been stable.  He is undergoing vestibular exercises to help treat recurrent vertigo.    Past Medical History  Diagnosis Date  . Hyperlipidemia   . Spinal stenosis   . Anemia   . Carotid artery disease     a. s/p prior carotid endarterectomy.  [name] PVD (peripheral vascular disease)   . Hyponatremia     Chronic  . CVA (cerebral infarction)     a. When asked to clarify patient says he was told thumb numbness may be TIA. He does not recall formal stroke. CT head results are only available through [facility], not in [facility].  . CKD (chronic kidney disease) stage 4, GFR 15-29 ml/min   . Long term (current) use of anticoagulants   . PAF (paroxysmal atrial fibrillation)     a. Asymptomatic. On Eliquis. CHADSVASC 7.  . Sick sinus syndrome     With DDD St. Jude PM, initially placed in 1993 by Dr. Nils Pyle. Device upgrade 10/2002 to DDD, by Dr. Rollene Fare complicated by bleeding. Subsequent gen change 2011.  [name] Hypertensive cardiovascular disease   . Chronic diastolic congestive heart failure     ECHO 05/20/12 LVEF estimated by 2D at 55-60%  . Arthritis   . Gait disorder   . Diabetic peripheral neuropathy  associated with type 2 diabetes mellitus   . Diabetes mellitus     insulin dependent  . Elevated troponin I level 06/22/2014    a. 06/2014 felt due to demand ischemia.  . Chronic anticoagulation     For PAF, on Eliquis   . Urinary retention   . Pulmonary hypertension   . Foot drop, left 07/21/2014  . Asterixis 07/21/2014  . Hypertension   . Presence of permanent cardiac pacemaker   . Pneumonia     hx of pneumonia x 1     Past Surgical History  Procedure Laterality Date  . Lumbar laminectomy  1995  . Back surgery    . Total knee arthroplasty Left   . Quadriceps tendon repair    . Cataract extraction    . Carotid endarterectomy    . Yag laser application Right 08/20/8117    Procedure: YAG LASER CAPSULOTOMY OF RIGHT EYE;  Surgeon: [name], MD;  Location: [facility];  Service: Ophthalmology;  Laterality: Right;  . Tonsillectomy    . Pacemaker insertion      DDD St. Jude PM, initially placed in 1993 by Dr. Nils Pyle. Device upgrade 10/2002 to DDD, by Dr. Rollene Fare complicated by bleeding.  . Carotid endarterectomy Right 2006  . Transurethral resection of prostate    . Cardioversion N/A 06/16/2013    Procedure: CARDIOVERSION BEDSIDE;  Surgeon: [name], MD;  Location: [facility];  Service: Cardiovascular;  Laterality: N/A;  . Pacemaker generator change  12/09/2009    SJM Accent DR RF gen change by Dr [name]  . Insert / replace / remove pacemaker      2004   . Transurethral resection of prostate N/A 08/27/2014    Procedure: TRANSURETHRAL RESECTION OF THE PROSTATE WITH GYRUS INSTRUMENTS;  Surgeon: [name], MD;  [facility];  Service: Urology;  Laterality: N/A;  . Cystoscopy N/A 08/27/2014    Procedure: CYSTOSCOPY;  Surgeon: [name], MD;  Location[facility];  Service: Urology;  Laterality: N/A;     Current Outpatient Prescriptions  Medication Sig Dispense Refill  . allopurinol (ZYLOPRIM) 100 MG tablet Take 100 mg by mouth 2 (two) times daily.     [name] amiodarone (PACERONE)  200 MG tablet Take 1 tablet (200 mg total) by mouth at bedtime. 30 tablet 6  . apixaban (ELIQUIS) 2.5 MG TABS tablet Take 2.5 mg by mouth 2 (two) times daily.    . Carboxymethylcellul-Glycerin 0.5-0.9 % SOLN Place 1 drop into both eyes 3 (three) times daily as needed (dry eyes).    . ferrous sulfate 325 (65 FE) MG tablet Take 325 mg by mouth every other day.    . fexofenadine (ALLEGRA) 180 MG tablet Take 180 mg by mouth at bedtime.     . insulin aspart (NOVOLOG FLEXPEN) 100 UNIT/ML FlexPen Inject 0-10 Units into the skin 3 (three) times daily with meals. Per sliding scale:  CBG <175 no insulin (unless consuming large amounts of carbs; 6-10 units for CBG >175 based on actual CBG and number of carbs in meal; >325 call MD    . insulin glargine (LANTUS) 100 unit/mL SOPN Inject 10 Units into the skin at bedtime.    . isosorbide mononitrate (IMDUR) 60 MG 24 hr tablet Take 1 tablet (60 mg total) by mouth daily. 30 tablet 5  . meclizine (ANTIVERT) 12.5 MG tablet Take 1 tablet (12.5 mg total) by mouth 3 (three) times daily as needed for dizziness. 30 tablet 0  . metolazone (ZAROXOLYN) 2.5 MG tablet Take 1 tablet (2.5 mg total) by mouth daily as needed (take 30 min prior to Demadex, if weight goes up 3 pounds from dry weigth or develops shortness of breath). (Patient taking differently: Take 2.5 mg by mouth daily as needed (take 30 min prior to Demadex, if weight is 178 or over). ) 30 tablet 0  . metoprolol tartrate (LOPRESSOR) 25 MG tablet Take 1 tablet (25 mg total) by mouth daily. 30 tablet 6  . nitroGLYCERIN (NITROSTAT) 0.4 MG SL tablet Place 1 tablet (0.4 mg total) under the tongue every 5 (five) minutes as needed for chest pain. 25 tablet 3  . OVER THE COUNTER MEDICATION Take 1 packet by mouth daily. Nature Made Diabetic Pak (6 tablet combo)    . polyethylene glycol (MIRALAX / GLYCOLAX) packet Take 17 g by mouth 2 (two) times daily.     . potassium chloride SA (K-DUR,KLOR-CON) 20 MEQ tablet Take 1 tablet  (20 mEq total) by mouth daily. 30 tablet 6  . Tafluprost 0.0015 % SOLN Place 1 drop into both eyes at bedtime. ZIOPTAN    . torsemide (DEMADEX) 20 MG tablet Take 4 tablets (80 mg total) by mouth daily. 120 tablet 3   No current facility-administered medications for this visit.    Allergies:   Statins; Aggrenox; Carvedilol; Claritin; Cymbalta; Mucinex; Phenergan; Plavix; Rapaflo; and Travatan z  Social History:  The patient  reports that he quit smoking about 56 years ago. His smoking use included Pipe and Cigarettes. He quit after 2 years of use. He has never used smokeless tobacco. He reports that he does not drink alcohol or use illicit drugs.   Family History:  The patient's family history includes COPD in his sister; Healthy in his sister; Heart failure in his sister; Hypertension in his mother; Multiple myeloma in his father.    ROS:  Please see the history of present illness.   Otherwise, review of systems are positive for low potassium when last evaluated during the most recent hospital stay.   All other systems are reviewed and negative.    PHYSICAL EXAM: VS:  BP 140/76 mmHg  Pulse 65  Ht '5\' 8"'  (1.727 m)  Wt 176 lb 6.4 oz (80.015 kg)  BMI 26.83 kg/m2  SpO2 95% , BMI Body mass index is 26.83 kg/(m^2). GEN: Well nourished, well developed, in no acute distress HEENT: normal Neck: no JVD, carotid bruits, or masses Cardiac: RRR; no murmurs, rubs, or gallops,no edema  Respiratory:  clear to auscultation bilaterally, normal work of breathing GI: soft, nontender, nondistended, + BS MS: no deformity or atrophy Skin: warm and dry, no rash Neuro:  Strength and sensation are intact Psych: euthymic mood, full affect   EKG:  EKG is not ordered today. The most recent pacemaker interrogation demonstrated no episodes of atrial fib   Recent Labs: 07/16/2014: Pro B Natriuretic peptide (BNP) 460.0*; TSH 1.050 10/04/2014: ALT 36; B Natriuretic Peptide 467.9* 10/11/2014: Hemoglobin 11.1*;  Platelets 185 10/14/2014: BUN 44*; Creatinine 2.73*; Magnesium 2.4; Potassium 3.7; Sodium 134*    Lipid Panel No results found for: CHOL, TRIG, HDL, CHOLHDL, VLDL, LDLCALC, LDLDIRECT    Wt Readings from Last 3 Encounters:  10/14/14 176 lb 6.4 oz (80.015 kg)  10/11/14 180 lb 9.6 oz (81.92 kg)  10/05/14 177 lb 1.6 oz (80.332 kg)      Other studies Reviewed: Additional studies/ records that were reviewed today include: Recent hospitalization and discharge summaries. Review of the above records demonstrates: No new data other than confirmation of vertigo as the reason for evaluation   ASSESSMENT AND PLAN:  Vertigo: Recurrent and not vascular related  Chronic diastolic heart failure-no volume overload. We will do a basic metabolic panel today to reassess potassium in light of his diarrhetic regimen.  Essential hypertension-controlled  Paroxysmal atrial fibrillation, DCCV 06/16/13- no recent episodes  Pacemaker - St Jude  Long term current use of amiodarone-no evidence of obvious toxicity  CKD (chronic kidney disease) stage 4, GFR 15-29 ml/min  Chronic anticoagulation without bleeding, Eliquis    Current medicines are reviewed at length with the patient today.  The patient does not have concerns regarding medicines.  The following changes have been made:  no change  Labs/ tests ordered today include:   Orders Placed This Encounter  Procedures  . Basic metabolic panel  . Magnesium     Disposition:   FU with HS in 3 weeks  Signed, [name], MD  10/14/2014 1:49 PM    [facility] [place], [place],   [address] Phone: [phone]; Fax: [phone]

## 2014-10-18 ENCOUNTER — Telehealth: Payer: Self-pay

## 2014-10-18 NOTE — Telephone Encounter (Signed)
-----   Message from Belva Crome, MD sent at 10/14/2014  7:15 PM EDT ----- Labs are stable. The magnesium is mildly decreased at 2.4 which is unchanged from 3 months ago and one year ago.

## 2014-10-19 ENCOUNTER — Encounter: Payer: Self-pay | Admitting: Physical Therapy

## 2014-10-19 ENCOUNTER — Ambulatory Visit: Payer: Medicare Other | Attending: Internal Medicine | Admitting: Physical Therapy

## 2014-10-19 DIAGNOSIS — H8111 Benign paroxysmal vertigo, right ear: Secondary | ICD-10-CM

## 2014-10-19 NOTE — Therapy (Signed)
[facility] [address] [place], [place], [address] Phone: [phone]   Fax:  [phone]  Physical Therapy Evaluation  Patient Details  Name: Darrell STAEBELL, MD MRN: [id number] Date of Birth: 10-08-1918 Referring Provider:  [name], MD  Encounter Date: 10/19/2014      PT End of Session - 10/19/14 1626    Visit Number 1  G1   Number of Visits 1   Date for PT Re-Evaluation --  pt. is not scheduled to return unless he has another episode of vertigo to occur within next 30 days   Authorization Type Medicare   Authorization Time Period 10-19-14 - 12-18-14   PT Start Time 1314   PT Stop Time 1401   PT Time Calculation (min) 47 min      Past Medical History  Diagnosis Date  . Hyperlipidemia   . Spinal stenosis   . Anemia   . Carotid artery disease     a. s/p prior carotid endarterectomy.  [name] PVD (peripheral vascular disease)   . Hyponatremia     Chronic  . CVA (cerebral infarction)     a. When asked to clarify patient says he was told thumb numbness may be TIA. He does not recall formal stroke. CT head results are only available through [facility], not in [facility].  . CKD (chronic kidney disease) stage 4, GFR 15-29 ml/min   . Long term (current) use of anticoagulants   . PAF (paroxysmal atrial fibrillation)     a. Asymptomatic. On Eliquis. CHADSVASC 7.  . Sick sinus syndrome     With DDD St. Jude PM, initially placed in 1993 by Dr. Nils Pyle. Device upgrade 10/2002 to DDD, by Dr. Rollene Fare complicated by bleeding. Subsequent gen change 2011.  [name] Hypertensive cardiovascular disease   . Chronic diastolic congestive heart failure     ECHO 05/20/12 LVEF estimated by 2D at 55-60%  . Arthritis   . Gait disorder   . Diabetic peripheral neuropathy associated with type 2 diabetes mellitus   . Diabetes mellitus     insulin dependent  . Elevated troponin I level 06/22/2014    a. 06/2014 felt due to demand ischemia.  . Chronic  anticoagulation     For PAF, on Eliquis   . Urinary retention   . Pulmonary hypertension   . Foot drop, left 07/21/2014  . Asterixis 07/21/2014  . Hypertension   . Presence of permanent cardiac pacemaker   . Pneumonia     hx of pneumonia x 1     Past Surgical History  Procedure Laterality Date  . Lumbar laminectomy  1995  . Back surgery    . Total knee arthroplasty Left   . Quadriceps tendon repair    . Cataract extraction    . Carotid endarterectomy    . Yag laser application Right 7/93/9030    Procedure: YAG LASER CAPSULOTOMY OF RIGHT EYE;  Surgeon: [name], MD;  Location: [facility];  Service: Ophthalmology;  Laterality: Right;  . Tonsillectomy    . Pacemaker insertion      DDD St. Jude PM, initially placed in 1993 by Dr. Nils Pyle. Device upgrade 10/2002 to DDD, by Dr. Rollene Fare complicated by bleeding.  . Carotid endarterectomy Right 2006  . Transurethral resection of prostate    . Cardioversion N/A 06/16/2013    Procedure: CARDIOVERSION BEDSIDE;  Surgeon: [name], MD;  Location: [facility];  Service: Cardiovascular;  Laterality: N/A;  . Pacemaker generator  change  12/09/2009    SJM Accent DR RF gen change by Dr [name]  . Insert / replace / remove pacemaker      2004   . Transurethral resection of prostate N/A 08/27/2014    Procedure: TRANSURETHRAL RESECTION OF THE PROSTATE WITH GYRUS INSTRUMENTS;  Surgeon: [name], MD;  [facility];  Service: Urology;  Laterality: N/A;  . Cystoscopy N/A 08/27/2014    Procedure: CYSTOSCOPY;  Surgeon: [name], MD;  [facility];  Service: Urology;  Laterality: N/A;    There were no vitals filed for this visit.  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  He was admittted to [facility] for further work up and then discharged on the following day, 10-11-14.  He states he was treated for vertigo while in the hospital and it helped to significantly decrease and potentially resolve the vertigo. He states he has been doing the Brandt-Daroff exercises twice a day since last Wednesday and contributes this to helping resolve the vertigo.                                                                                                                                                                        Patient is accompained by: Family member   Pertinent History CKD, Type 2 DM, chronic diastolic heart failure, prior embolic CVA   Patient Stated Goals resolve the vertigo   Currently in Pain? No/denies            [facility] PT Assessment - 10/19/14 0001    Assessment   Medical Diagnosis BPPV   Onset Date/Surgical Date 10/10/14   Prior Therapy several years ago at this facility for BPPV   Balance Screen   Has the patient fallen in the past 6 months Yes   How many times? 1   Has the patient had a decrease in activity level because of a fear of falling?  No   Is the patient reluctant to leave their home because of a fear of falling?  No            Vestibular Assessment - 10/19/14 0001    Positional Sensitivities   Right Hallpike No dizziness   Up from Right Hallpike Lightheadedness   Up from Left Hallpike Lightheadedness   Rolling Right No dizziness  Rolling Left No dizziness   Positional Sensitivities Comments sit to R sidelying provoked approx. 3 beats of rotary nystagmus and minimal c/o vertigo     Pt. Was treated with R Epley maneuver for R BPPV - x 1 rep due to no c/o vertigo in any position; re-assessed pt's status after Epley's by having pt. Transfer sit to R sidelying  And then rolling to left side and back to right side - pt. Had no  c/o vertigo after Canalith repositioning maneuver performed  Plan is to place pt on Hold for 1 month should BPPV re-occur - follow up will be scheduled if needed  Pt. Agrees with this plan              PT Education - 2014/11/09 1624    Education provided Yes   Education Details pt. given info on BPPV etiology - including pictures of Brandt-Daroff exs. in that information   Person(s) Educated Patient;Child(ren)   Methods Explanation;Handout   Comprehension Verbalized understanding             PT Long Term Goals - 11-09-14 1633    PT LONG TERM GOAL #1   Title Independent in HEP for habituation and Brandt-Daroff exercises.   Time 4   Period Weeks   Status New   PT LONG TERM GOAL #2   Title Pt. will report no vertigo with ambulation or bed mobility.   Time 4   Period Weeks   Status New               Plan - 11-09-14 1628    Clinical Impression Statement Pt. had 3 - 4 beats of rotary R upbeating nystagmus indicative of R BPPV but quickly subsided and pt. reported min. vertigo; pt. appears to have almost fully resolved  R BPPV by doing Brandt-Daroff exercises - in combination with treatment of canalith repositioning administered while in hospital                                                                                      Rehab Potential Good   PT Frequency 1x / week  eval only   PT Duration --  1 week for eval only - pt. to be placed on HOLD should vertigo re-occur within next month   PT Treatment/Interventions ADLs/Self Care Home Management;Vestibular;Canalith Repostioning;Patient/family education   PT Next Visit Plan not scheduled - on HOLD should vertigo re-occur   PT Home Exercise Plan Brandt-Daroff   Consulted and Agree with Plan of Care Patient;Family member/caregiver   Family Member Consulted daughter          G-Codes - 11/09/2014 1636    Functional Assessment Tool Used clinical judgment - positive R sidelying test indicative of R BPPV    Functional Limitation Other PT primary   Mobility: Walking and Moving Around Current Status (O0321) At least 1 percent but less than 20 percent impaired, limited or restricted   Mobility: Walking and Moving Around Goal Status [phone]) 0 percent impaired, limited or restricted       Problem List Patient Active Problem List   Diagnosis Date Noted  . Congestive heart disease   .  Dizziness   . Dehydration 10/04/2014  . Vertigo 10/04/2014  . Urinary retention due to benign prostatic hyperplasia 08/27/2014  . Foot drop, left 07/21/2014  . Leg weakness 07/16/2014  . Chronic diastolic heart failure 63/84/6659  . Essential hypertension   . Gait disorder 03/31/2014  . Solitary pulmonary nodule 10/09/2013  . CKD (chronic kidney disease) stage 4, GFR 15-29 ml/min   . Long term current use of amiodarone 06/25/2013  . Chronic anticoagulation 04/03/2013    Class: Chronic  . Pacemaker - St Jude 04/03/2013    Class: Chronic  . Paroxysmal atrial fibrillation, DCCV 06/16/13 08/22/2012    Class: Chronic  . Hypertensive cardiovascular disease   . Type 2 diabetes mellitus with peripheral neuropathy     Alda Lea, PT 10/19/2014, 4:41 PM  [facility] [address] [place], [place], [address] Phone: [phone]   Fax:  [phone]

## 2014-10-26 ENCOUNTER — Other Ambulatory Visit: Payer: Self-pay | Admitting: *Deleted

## 2014-10-28 ENCOUNTER — Encounter: Payer: Self-pay | Admitting: *Deleted

## 2014-10-28 ENCOUNTER — Other Ambulatory Visit: Payer: Self-pay | Admitting: *Deleted

## 2014-10-28 NOTE — Patient Outreach (Addendum)
I have made 2 outreach calls to Dr. Kennon Holter, each time I have been told by [name], his office assistant that he is not available. I advised her that I am obligated to make 3 attempts to speak with him but if she feels it would be better for me to send the program details in the mail, I will do that. She agreed that this would be the best method to outreach to Dr. Kennon Holter. I will send a letter to him and request that a [facility] packet be sent to him.  I will engage Dr. Kennon Holter, if he calls back and agrees to participate.  Deloria Lair [facility] [phone]

## 2014-11-02 NOTE — Patient Outreach (Signed)
[facility]) Care Management  11/02/2014  [name], MD [birth year] [id number]   Referral from [place] [facility], Deloria Lair, NP to outreach.  Ronnell Freshwater. [place], [place] [facility] [place] Assistant Phone: [phone] Fax: [phone]

## 2014-11-03 ENCOUNTER — Ambulatory Visit (INDEPENDENT_AMBULATORY_CARE_PROVIDER_SITE_OTHER): Payer: Medicare Other | Admitting: Interventional Cardiology

## 2014-11-03 ENCOUNTER — Encounter: Payer: Self-pay | Admitting: Interventional Cardiology

## 2014-11-03 VITALS — BP 110/64 | HR 61 | Ht 68.0 in | Wt 177.2 lb

## 2014-11-03 DIAGNOSIS — Z950 Presence of cardiac pacemaker: Secondary | ICD-10-CM

## 2014-11-03 DIAGNOSIS — Z7901 Long term (current) use of anticoagulants: Secondary | ICD-10-CM

## 2014-11-03 DIAGNOSIS — N184 Chronic kidney disease, stage 4 (severe): Secondary | ICD-10-CM | POA: Diagnosis not present

## 2014-11-03 DIAGNOSIS — I5032 Chronic diastolic (congestive) heart failure: Secondary | ICD-10-CM

## 2014-11-03 DIAGNOSIS — I208 Other forms of angina pectoris: Secondary | ICD-10-CM | POA: Diagnosis not present

## 2014-11-03 DIAGNOSIS — I480 Paroxysmal atrial fibrillation: Secondary | ICD-10-CM

## 2014-11-03 DIAGNOSIS — I10 Essential (primary) hypertension: Secondary | ICD-10-CM

## 2014-11-03 DIAGNOSIS — Z79899 Other long term (current) drug therapy: Secondary | ICD-10-CM

## 2014-11-03 NOTE — Patient Instructions (Addendum)
Medication Instructions:  Your physician recommends that you continue on your current medications as directed. Please refer to the Current Medication list given to you today.   Labwork: [facility] Today  Testing/Procedures: None   Follow-Up: You have a follow up appointment scheduled on 12/08/14 @ 9am  Any Other Special Instructions Will Be Listed Below (If Applicable).

## 2014-11-03 NOTE — Progress Notes (Signed)
Cardiology Office Note   Date:  11/03/2014   ID:  Darrell Mulder, [name], DOB 10/12/1918, MRN [id number]  PCP:  [name], [name]  Cardiologist:  [name], [name]   No chief complaint on file.     History of Present Illness: Darrell Mulder, [name] is a 81 y.o. male who presents for chronic diastolic heart failure, chronic kidney disease stage III IV, paroxysmal atrial fibrillation, chronic anticoagulation, and systemic arterial hypertension. 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  Over the past 3-4 weeks the patient is been very stable. He denies orthopnea and PND. His weight is been in the 174-177 range. The regimen of using metolazone once a week has been working very well. He denies angina.    Past Medical History  Diagnosis Date  . Hyperlipidemia   . Spinal stenosis   . Anemia   . Carotid artery disease     a. s/p prior carotid endarterectomy.  [name] PVD (peripheral vascular disease)   . Hyponatremia     Chronic  . CVA (cerebral infarction)     a. When asked to clarify patient says he was told thumb numbness may be TIA. He does not recall formal stroke. CT head results are only available through [facility], not in [facility].  . CKD (chronic kidney disease) stage 4, GFR 15-29 ml/min   . Long term (current) use of anticoagulants   . PAF (paroxysmal atrial fibrillation)     a. Asymptomatic. On Eliquis. CHADSVASC 7.  . Sick sinus syndrome     With DDD St. Jude PM, initially placed in 1993 by Dr. Nils Pyle. Device upgrade 10/2002 to DDD, by Dr. Rollene Fare complicated by bleeding. Subsequent gen change 2011.  [name] Hypertensive cardiovascular disease   . Chronic diastolic congestive heart failure     ECHO 05/20/12 LVEF estimated by 2D at 55-60%  . Arthritis   . Gait disorder   . Diabetic peripheral neuropathy associated with type 2 diabetes mellitus   . Diabetes mellitus     insulin dependent  . Elevated troponin I level 06/22/2014    a. 06/2014 felt due to demand ischemia.  . Chronic anticoagulation    For PAF, on Eliquis   . Urinary retention   . Pulmonary hypertension   . Foot drop, left 07/21/2014  . Asterixis 07/21/2014  . Hypertension   . Presence of permanent cardiac pacemaker   . Pneumonia     hx of pneumonia x 1     Past Surgical History  Procedure Laterality Date  . Lumbar laminectomy  1995  . Back surgery    . Total knee arthroplasty Left   . Quadriceps tendon repair    . Cataract extraction    . Carotid endarterectomy    . Yag laser application Right 1/91/4782    Procedure: YAG LASER CAPSULOTOMY OF RIGHT EYE;  Surgeon: [name], [name];  Location: [facility];  Service: Ophthalmology;  Laterality: Right;  . Tonsillectomy    . Pacemaker insertion      DDD St. Jude PM, initially placed in 1993 by Dr. Nils Pyle. Device upgrade 10/2002 to DDD, by Dr. Rollene Fare complicated by bleeding.  . Carotid endarterectomy Right 2006  . Transurethral resection of prostate    . Cardioversion N/A 06/16/2013    Procedure: CARDIOVERSION BEDSIDE;  Surgeon: [name], [name];  Location: [facility];  Service: Cardiovascular;  Laterality: N/A;  . Pacemaker generator change  12/09/2009    SJM Accent DR RF gen change by Dr [name]  .  Insert / replace / remove pacemaker      2004   . Transurethral resection of prostate N/A 08/27/2014    Procedure: TRANSURETHRAL RESECTION OF THE PROSTATE WITH GYRUS INSTRUMENTS;  Surgeon: [name], [name];  Location: [facility];  Service: Urology;  Laterality: N/A;  . Cystoscopy N/A 08/27/2014    Procedure: CYSTOSCOPY;  Surgeon: [name], [name];  Location: [facility];  Service: Urology;  Laterality: N/A;     Current Outpatient Prescriptions  Medication Sig Dispense Refill  . allopurinol (ZYLOPRIM) 100 MG tablet Take 100 mg by mouth 2 (two) times daily.     [name] amiodarone (PACERONE) 200 MG tablet Take 1 tablet (200 mg total) by mouth at bedtime. 30 tablet 6  . apixaban (ELIQUIS) 2.5 MG TABS tablet Take 2.5 mg by mouth 2 (two) times daily.    . Carboxymethylcellul-Glycerin 0.5-0.9  % SOLN Place 1 drop into both eyes 3 (three) times daily as needed (dry eyes).    . ferrous sulfate 325 (65 FE) MG tablet Take 325 mg by mouth every other day.    . fexofenadine (ALLEGRA) 180 MG tablet Take 180 mg by mouth at bedtime.     . insulin aspart (NOVOLOG FLEXPEN) 100 UNIT/ML FlexPen Inject 0-10 Units into the skin 3 (three) times daily with meals. Per sliding scale:  CBG <175 no insulin (unless consuming large amounts of carbs; 6-10 units for CBG >175 based on actual CBG and number of carbs in meal; >325 call [name]    . insulin glargine (LANTUS) 100 unit/mL SOPN Inject 10 Units into the skin at bedtime.    . isosorbide mononitrate (IMDUR) 60 MG 24 hr tablet Take 1 tablet (60 mg total) by mouth daily. 30 tablet 5  . meclizine (ANTIVERT) 12.5 MG tablet Take 1 tablet (12.5 mg total) by mouth 3 (three) times daily as needed for dizziness. 30 tablet 0  . metolazone (ZAROXOLYN) 2.5 MG tablet Take 1 tablet (2.5 mg total) by mouth daily as needed (take 30 min prior to Demadex, if weight goes up 3 pounds from dry weigth or develops shortness of breath). (Patient taking differently: Take 2.5 mg by mouth daily as needed (take 30 min prior to Demadex, if weight is 178 or over). ) 30 tablet 0  . metoprolol tartrate (LOPRESSOR) 25 MG tablet Take 1 tablet (25 mg total) by mouth daily. 30 tablet 6  . nitroGLYCERIN (NITROSTAT) 0.4 MG SL tablet Place 1 tablet (0.4 mg total) under the tongue every 5 (five) minutes as needed for chest pain. 25 tablet 3  . OVER THE COUNTER MEDICATION Take 1 packet by mouth daily. Nature Made Diabetic Pak (6 tablet combo)    . polyethylene glycol (MIRALAX / GLYCOLAX) packet Take 17 g by mouth 2 (two) times daily.     . potassium chloride SA (K-DUR,KLOR-CON) 20 MEQ tablet Take 1 tablet (20 mEq total) by mouth daily. 30 tablet 6  . Tafluprost 0.0015 % SOLN Place 1 drop into both eyes at bedtime. ZIOPTAN    . torsemide (DEMADEX) 20 MG tablet Take 4 tablets (80 mg total) by mouth daily.  120 tablet 3   No current facility-administered medications for this visit.    Allergies:   Statins; Aggrenox; Carvedilol; Claritin; Cymbalta; Mucinex; Phenergan; Plavix; Rapaflo; and Travatan z    Social History:  The patient  reports that he quit smoking about 56 years ago. His smoking use included Pipe and Cigarettes. He quit after 2 years of use. He has never used smokeless  tobacco. He reports that he does not drink alcohol or use illicit drugs.   Family History:  The patient's family history includes COPD in his sister; Healthy in his sister; Heart failure in his sister; Hypertension in his mother; Multiple myeloma in his father.    ROS:  Please see the history of present illness.   Otherwise, review of systems are positive for none.   All other systems are reviewed and negative.    PHYSICAL EXAM: VS:  There were no vitals taken for this visit. , BMI There is no weight on file to calculate BMI. GEN: Well nourished, well developed, in no acute distress HEENT: normal Neck: no JVD, carotid bruits, or masses Cardiac: RRR; no murmurs, rubs, or gallops,no edema  Respiratory:  clear to auscultation bilaterally, normal work of breathing GI: soft, nontender, nondistended, + BS MS: no deformity or atrophy Skin: warm and dry, no rash Neuro:  Strength and sensation are intact Psych: euthymic mood, full affect   EKG:  EKG is not ordered today. The ekg ordered today demonstrates    Recent Labs: 07/16/2014: Pro B Natriuretic peptide (BNP) 460.0*; TSH 1.050 10/04/2014: ALT 36; B Natriuretic Peptide 467.9* 10/11/2014: Hemoglobin 11.1*; Platelets 185 10/14/2014: BUN 44*; Creatinine, Ser 2.73*; Magnesium 2.4; Potassium 3.7; Sodium 134*    Lipid Panel No results found for: CHOL, TRIG, HDL, CHOLHDL, VLDL, LDLCALC, LDLDIRECT    Wt Readings from Last 3 Encounters:  10/14/14 80.015 kg (176 lb 6.4 oz)  10/11/14 81.92 kg (180 lb 9.6 oz)  10/05/14 80.332 kg (177 lb 1.6 oz)      Other studies  Reviewed: Additional studies/ records that were reviewed today include: .    ASSESSMENT AND PLAN:  Paroxysmal atrial fibrillation, DCCV 06/16/13 - no recurrence on amiodarone  Chronic anticoagulation - Eliquis therapy  Chronic diastolic heart failure - no evidence of volume overload  CKD (chronic kidney disease) stage 4, GFR 15-29 ml/min - current status unknown  Essential hypertension - controlled  Long term current use of amiodarone     Current medicines are reviewed at length with the patient today.  The patient does not have concerns regarding medicines.  The following changes have been made:  We will check a basic metabolic panel today  Labs/ tests ordered today include:  No orders of the defined types were placed in this encounter.     Disposition:   FU with HS in 4 weeks  Signed, [name], [name]  11/03/2014 2:34 PM    [address], [place], [place]  [address] Phone: [phone]; Fax: [phone]

## 2014-11-04 ENCOUNTER — Telehealth: Payer: Self-pay

## 2014-11-04 LAB — BASIC METABOLIC PANEL
BUN: 54 mg/dL — ABNORMAL HIGH (ref 6–23)
CO2: 37 meq/L — AB (ref 19–32)
Calcium: 9.5 mg/dL (ref 8.4–10.5)
Chloride: 91 mEq/L — ABNORMAL LOW (ref 96–112)
Creatinine, Ser: 2.72 mg/dL — ABNORMAL HIGH (ref 0.40–1.50)
GFR: 28.18 mL/min — ABNORMAL LOW (ref >60.00)
Glucose, Bld: 171 mg/dL — ABNORMAL HIGH (ref 70–99)
Potassium: 4 mEq/L (ref 3.5–5.1)
SODIUM: 134 meq/L — AB (ref 135–145)

## 2014-11-04 NOTE — Telephone Encounter (Signed)
Pt aware of lab results 

## 2014-11-04 NOTE — Telephone Encounter (Signed)
-----   Message from Belva Crome, MD sent at 11/04/2014  2:24 PM EDT ----- Everything is stable

## 2014-11-08 ENCOUNTER — Other Ambulatory Visit: Payer: Self-pay

## 2014-12-01 ENCOUNTER — Ambulatory Visit (INDEPENDENT_AMBULATORY_CARE_PROVIDER_SITE_OTHER): Payer: Medicare Other | Admitting: *Deleted

## 2014-12-01 DIAGNOSIS — I495 Sick sinus syndrome: Secondary | ICD-10-CM | POA: Diagnosis not present

## 2014-12-01 NOTE — Progress Notes (Signed)
Remote pacemaker transmission.   

## 2014-12-07 NOTE — Progress Notes (Signed)
Cardiology Office Note   Date:  12/08/2014   ID:  Darrell Mulder, [name], DOB 1918/11/15, MRN [id number]  PCP:  [name], [name]  Cardiologist:  [name], [name]   Chief Complaint  Patient presents with  . Congestive Heart Failure      History of Present Illness: Darrell Mulder, [name] is a 81 y.o. male who presents for CAD, diastolic heart failure, chronic kidney disease, diabetes, chronic anticoagulation therapy, proximal atrial fibrillation, prostate cancer, hypertension, and CAD.  [name] is doing well. He is on a weight-based diuretic regimen. We try to keep his weights less than 178 pounds. He has been sleeping well. He denies orthopnea and PND. He has not had angina. He is ambulatory and continues to go to clinic [facility] several days per week.    Past Medical History  Diagnosis Date  . Hyperlipidemia   . Spinal stenosis   . Anemia   . Carotid artery disease     a. s/p prior carotid endarterectomy.  [name] PVD (peripheral vascular disease)   . Hyponatremia     Chronic  . CVA (cerebral infarction)     a. When asked to clarify patient says he was told thumb numbness may be TIA. He does not recall formal stroke. CT head results are only available through [facility], not in [facility].  . CKD (chronic kidney disease) stage 4, GFR 15-29 ml/min   . Long term (current) use of anticoagulants   . PAF (paroxysmal atrial fibrillation)     a. Asymptomatic. On Eliquis. CHADSVASC 7.  . Sick sinus syndrome     With DDD St. Jude PM, initially placed in 1993 by Dr. Nils Pyle. Device upgrade 10/2002 to DDD, by Dr. Rollene Fare complicated by bleeding. Subsequent gen change 2011.  [name] Hypertensive cardiovascular disease   . Chronic diastolic congestive heart failure     ECHO 05/20/12 LVEF estimated by 2D at 55-60%  . Arthritis   . Gait disorder   . Diabetic peripheral neuropathy associated with type 2 diabetes mellitus   . Diabetes mellitus     insulin dependent  . Elevated troponin I level 06/22/2014      a. 06/2014 felt due to demand ischemia.  . Chronic anticoagulation     For PAF, on Eliquis   . Urinary retention   . Pulmonary hypertension   . Foot drop, left 07/21/2014  . Asterixis 07/21/2014  . Hypertension   . Presence of permanent cardiac pacemaker   . Pneumonia     hx of pneumonia x 1     Past Surgical History  Procedure Laterality Date  . Lumbar laminectomy  1995  . Back surgery    . Total knee arthroplasty Left   . Quadriceps tendon repair    . Cataract extraction    . Carotid endarterectomy    . Yag laser application Right 6/38/9373    Procedure: YAG LASER CAPSULOTOMY OF RIGHT EYE;  Surgeon: [name], [name];  Location: [facility];  Service: Ophthalmology;  Laterality: Right;  . Tonsillectomy    . Pacemaker insertion      DDD St. Jude PM, initially placed in 1993 by Dr. Nils Pyle. Device upgrade 10/2002 to DDD, by Dr. Rollene Fare complicated by bleeding.  . Carotid endarterectomy Right 2006  . Transurethral resection of prostate    . Cardioversion N/A 06/16/2013    Procedure: CARDIOVERSION BEDSIDE;  Surgeon: [name], [name];  Location: [facility];  Service: Cardiovascular;  Laterality: N/A;  .  Pacemaker generator change  12/09/2009    SJM Accent DR RF gen change by Dr [name]  . Insert / replace / remove pacemaker      2004   . Transurethral resection of prostate N/A 08/27/2014    Procedure: TRANSURETHRAL RESECTION OF THE PROSTATE WITH GYRUS INSTRUMENTS;  Surgeon: [name], [name];  [facility];  Service: Urology;  Laterality: N/A;  . Cystoscopy N/A 08/27/2014    Procedure: CYSTOSCOPY;  Surgeon: [name], [name];  Location[facility];  Service: Urology;  Laterality: N/A;     Current Outpatient Prescriptions  Medication Sig Dispense Refill  . allopurinol (ZYLOPRIM) 100 MG tablet Take 100 mg by mouth 2 (two) times daily.     [name] amiodarone (PACERONE) 200 MG tablet Take 1 tablet (200 mg total) by mouth at bedtime. 30 tablet 6  . apixaban (ELIQUIS) 2.5 MG TABS tablet Take 2.5  mg by mouth 2 (two) times daily.    . Carboxymethylcellul-Glycerin 0.5-0.9 % SOLN Place 1 drop into both eyes 3 (three) times daily as needed (dry eyes).    . ferrous sulfate 325 (65 FE) MG tablet Take 325 mg by mouth every other day.    . fexofenadine (ALLEGRA) 180 MG tablet Take 180 mg by mouth at bedtime.     . insulin aspart (NOVOLOG FLEXPEN) 100 UNIT/ML FlexPen Inject 0-10 Units into the skin 3 (three) times daily with meals. Per sliding scale:  CBG <175 no insulin (unless consuming large amounts of carbs; 6-10 units for CBG >175 based on actual CBG and number of carbs in meal; >325 call [name]    . insulin detemir (LEVEMIR) 100 UNIT/ML injection Inject 10 Units into the skin at bedtime.    . isosorbide mononitrate (IMDUR) 60 MG 24 hr tablet Take 1 tablet (60 mg total) by mouth daily. 30 tablet 5  . meclizine (ANTIVERT) 12.5 MG tablet Take 1 tablet (12.5 mg total) by mouth 3 (three) times daily as needed for dizziness. 30 tablet 0  . metolazone (ZAROXOLYN) 2.5 MG tablet Take 1 tablet (2.5 mg total) by mouth daily as needed (take 30 min prior to Demadex, if weight goes up 3 pounds from dry weigth or develops shortness of breath). (Patient taking differently: Take 2.5 mg by mouth daily as needed (take 30 min prior to Demadex, if weight is 178 or over). ) 30 tablet 0  . metoprolol tartrate (LOPRESSOR) 25 MG tablet Take 1 tablet (25 mg total) by mouth daily. 30 tablet 6  . nitroGLYCERIN (NITROSTAT) 0.4 MG SL tablet Place 1 tablet (0.4 mg total) under the tongue every 5 (five) minutes as needed for chest pain. 25 tablet 3  . OVER THE COUNTER MEDICATION Take 1 packet by mouth daily. Nature Made Diabetic Pak (6 tablet combo)    . polyethylene glycol (MIRALAX / GLYCOLAX) packet Take 17 g by mouth 2 (two) times daily.     . potassium chloride SA (K-DUR,KLOR-CON) 20 MEQ tablet Take 1 tablet (20 mEq total) by mouth daily. 30 tablet 6  . Tafluprost 0.0015 % SOLN Place 1 drop into both eyes at bedtime. ZIOPTAN      . torsemide (DEMADEX) 20 MG tablet Take 4 tablets (80 mg total) by mouth daily. 120 tablet 3   No current facility-administered medications for this visit.    Allergies:   Statins; Aggrenox; Carvedilol; Claritin; Cymbalta; Mucinex; Phenergan; Plavix; Rapaflo; and Travatan z    Social History:  The patient  reports that he quit smoking about 56 years  ago. His smoking use included Pipe and Cigarettes. He quit after 2 years of use. He has never used smokeless tobacco. He reports that he does not drink alcohol or use illicit drugs.   Family History:  The patient's family history includes COPD in his sister; Healthy in his sister; Heart failure in his sister; Hypertension in his mother; Multiple myeloma in his father.    ROS:  Please see the history of present illness.   Otherwise, review of systems are positive for dyspnea on exertion. Occasional palpitations. Denies blood in the urine. No difficulty with urinary flow..   All other systems are reviewed and negative.    PHYSICAL EXAM: VS:  BP 100/62 mmHg  Pulse 62  Ht '5\' 8"'  (1.727 m)  Wt 80.922 kg (178 lb 6.4 oz)  BMI 27.13 kg/m2  SpO2 98% , BMI Body mass index is 27.13 kg/(m^2). GEN: Well nourished, well developed, in no acute distress HEENT: normal Neck: no JVD, carotid bruits, or masses Cardiac: RRR; no murmurs, rubs, or gallops,no edema  Respiratory: Mild reduction in breath sounds at the left base with faint rales.; There is normal work of breathing GI: soft, nontender, nondistended, + BS MS: no deformity or atrophy Skin: warm and dry, no rash Neuro:  Strength and sensation are intact Psych: euthymic mood, full affect   EKG:  EKG is not ordered today.    Recent Labs: 07/16/2014: Pro B Natriuretic peptide (BNP) 460.0*; TSH 1.050 10/04/2014: ALT 36; B Natriuretic Peptide 467.9* 10/11/2014: Hemoglobin 11.1*; Platelets 185 10/14/2014: Magnesium 2.4 11/03/2014: BUN 54*; Creatinine, Ser 2.72*; Potassium 4.0; Sodium 134*    Lipid  Panel No results found for: CHOL, TRIG, HDL, CHOLHDL, VLDL, LDLCALC, LDLDIRECT    Wt Readings from Last 3 Encounters:  12/08/14 80.922 kg (178 lb 6.4 oz)  11/03/14 80.377 kg (177 lb 3.2 oz)  10/14/14 80.015 kg (176 lb 6.4 oz)      Other studies Reviewed: Additional studies/ records that were reviewed today include: The primary physician attempted to have blood work performed that they will unable to stick him. They have requested that we do a PSA and hemoglobin A1c along with the laboratory data that we would ordinarily do to follow.. Review of the above records demonstrates:    ASSESSMENT AND PLAN:  1. Paroxysmal atrial fibrillation, DCCV 06/16/13 No prolonged episodes  2. Pacemaker - St Jude No abnormalities or difficulty with pacing  3. Chronic anticoagulation No bleeding, especially with reference to urinary tract  4. CKD (chronic kidney disease) stage 4, GFR 15-29 ml/min Current renal function not known. Laboratory data will be checked today  5. Long term current use of amiodarone TSH will be done today along with liver panel  6. Essential hypertension Excellent control  7. Chronic diastolic heart failure No significant evidence of volume overload    Current medicines are reviewed at length with the patient today.  The patient does not have concerns regarding medicines.  The following changes have been made:  Continue weight-based diuretic regimen.  Labs/ tests ordered today include:   Orders Placed This Encounter  Procedures  . CBC w/Diff  . Comp Met (CMET)  . HgB A1c  . TSH  . PSA     Disposition:   FU with HS in 3 weeks  Signed, [name], [name]  12/08/2014 9:40 AM    [address], [place], [place]  [address] Phone: [phone]; Fax: [phone]

## 2014-12-08 ENCOUNTER — Encounter: Payer: Self-pay | Admitting: Interventional Cardiology

## 2014-12-08 ENCOUNTER — Ambulatory Visit (INDEPENDENT_AMBULATORY_CARE_PROVIDER_SITE_OTHER): Payer: Medicare Other | Admitting: Interventional Cardiology

## 2014-12-08 VITALS — BP 100/62 | HR 62 | Ht 68.0 in | Wt 178.4 lb

## 2014-12-08 DIAGNOSIS — Z79899 Other long term (current) drug therapy: Secondary | ICD-10-CM | POA: Diagnosis not present

## 2014-12-08 DIAGNOSIS — N184 Chronic kidney disease, stage 4 (severe): Secondary | ICD-10-CM

## 2014-12-08 DIAGNOSIS — I208 Other forms of angina pectoris: Secondary | ICD-10-CM | POA: Diagnosis not present

## 2014-12-08 DIAGNOSIS — E1169 Type 2 diabetes mellitus with other specified complication: Secondary | ICD-10-CM | POA: Diagnosis not present

## 2014-12-08 DIAGNOSIS — I480 Paroxysmal atrial fibrillation: Secondary | ICD-10-CM

## 2014-12-08 DIAGNOSIS — I5032 Chronic diastolic (congestive) heart failure: Secondary | ICD-10-CM | POA: Diagnosis not present

## 2014-12-08 DIAGNOSIS — C61 Malignant neoplasm of prostate: Secondary | ICD-10-CM

## 2014-12-08 DIAGNOSIS — I10 Essential (primary) hypertension: Secondary | ICD-10-CM

## 2014-12-08 DIAGNOSIS — Z7901 Long term (current) use of anticoagulants: Secondary | ICD-10-CM

## 2014-12-08 LAB — COMPREHENSIVE METABOLIC PANEL
ALT: 63 U/L — ABNORMAL HIGH (ref 0–53)
AST: 74 U/L — AB (ref 0–37)
Albumin: 3.5 g/dL (ref 3.5–5.2)
Alkaline Phosphatase: 63 U/L (ref 39–117)
BILIRUBIN TOTAL: 0.6 mg/dL (ref 0.2–1.2)
BUN: 53 mg/dL — ABNORMAL HIGH (ref 6–23)
CHLORIDE: 92 meq/L — AB (ref 96–112)
CO2: 40 mEq/L — ABNORMAL HIGH (ref 19–32)
Calcium: 9.9 mg/dL (ref 8.4–10.5)
Creatinine, Ser: 2.86 mg/dL — ABNORMAL HIGH (ref 0.40–1.50)
GFR: 26.59 mL/min — AB (ref >60.00)
GLUCOSE: 276 mg/dL — AB (ref 70–99)
POTASSIUM: 4.3 meq/L (ref 3.5–5.1)
SODIUM: 137 meq/L (ref 135–145)
TOTAL PROTEIN: 6.7 g/dL (ref 6.0–8.3)

## 2014-12-08 LAB — CBC WITH DIFFERENTIAL/PLATELET
BASOS PCT: 0.4 % (ref 0.0–3.0)
Basophils Absolute: 0 10*3/uL (ref 0.0–0.1)
EOS PCT: 0.3 % (ref 0.0–5.0)
Eosinophils Absolute: 0 10*3/uL (ref 0.0–0.7)
HEMATOCRIT: 38.4 % — AB (ref 39.0–52.0)
Hemoglobin: 12.7 g/dL — ABNORMAL LOW (ref 13.0–17.0)
LYMPHS PCT: 22.9 % (ref 12.0–46.0)
Lymphs Abs: 1.4 10*3/uL (ref 0.7–4.0)
MCHC: 33.2 g/dL (ref 30.0–36.0)
MCV: 102.2 fl — ABNORMAL HIGH (ref 78.0–100.0)
MONOS PCT: 8.2 % (ref 3.0–12.0)
Monocytes Absolute: 0.5 10*3/uL (ref 0.1–1.0)
NEUTROS PCT: 68.2 % (ref 43.0–77.0)
Neutro Abs: 4.2 10*3/uL (ref 1.4–7.7)
PLATELETS: 178 10*3/uL (ref 150.0–400.0)
RBC: 3.76 Mil/uL — AB (ref 4.22–5.81)
RDW: 16.3 % — ABNORMAL HIGH (ref 11.5–15.5)
WBC: 6.1 10*3/uL (ref 4.0–10.5)

## 2014-12-08 LAB — TSH: TSH: 1.02 u[IU]/mL (ref 0.35–4.50)

## 2014-12-08 LAB — HEMOGLOBIN A1C: Hgb A1c MFr Bld: 7.8 % — ABNORMAL HIGH (ref 4.6–6.5)

## 2014-12-08 LAB — PSA: PSA: 0.11 ng/mL (ref 0.10–4.00)

## 2014-12-08 NOTE — Patient Instructions (Signed)
Medication Instructions:  Your physician recommends that you continue on your current medications as directed. Please refer to the Current Medication list given to you today.   Labwork: Cbc,Cmet, HgA1c, Psa, Tsh  Testing/Procedures: None   Follow-Up: You have a follow up appointment scheduled on 12/29/14 @ 3:30pm  Any Other Special Instructions Will Be Listed Below (If Applicable).

## 2014-12-09 ENCOUNTER — Telehealth: Payer: Self-pay

## 2014-12-09 NOTE — Telephone Encounter (Signed)
-----   Message from Belva Crome, MD sent at 12/09/2014  9:06 AM EDT ----- A1C moderately elevated. PSA normal. Kidney function abnormal but stable. Thyroid and hemoglobin are essentially normal.

## 2014-12-09 NOTE — Telephone Encounter (Signed)
Called to give pt lab results. [email or web address] Lab results fwd to pcp

## 2014-12-10 LAB — CUP PACEART REMOTE DEVICE CHECK
Battery Remaining Longevity: 50 mo
Battery Voltage: 2.9 V
Brady Statistic AP VP Percent: 33 %
Brady Statistic AS VS Percent: 1 %
Brady Statistic RA Percent Paced: 99 %
Brady Statistic RV Percent Paced: 33 %
Lead Channel Pacing Threshold Amplitude: 1 V
Lead Channel Sensing Intrinsic Amplitude: 1.8 mV
Lead Channel Setting Pacing Amplitude: 2.5 V
Lead Channel Setting Pacing Pulse Width: 0.6 ms
Lead Channel Setting Sensing Sensitivity: 2 mV
MDC IDC MSMT BATTERY REMAINING PERCENTAGE: 65 %
MDC IDC MSMT LEADCHNL RA IMPEDANCE VALUE: 240 Ohm
MDC IDC MSMT LEADCHNL RA PACING THRESHOLD AMPLITUDE: 1.25 V
MDC IDC MSMT LEADCHNL RA PACING THRESHOLD PULSEWIDTH: 0.8 ms
MDC IDC MSMT LEADCHNL RV IMPEDANCE VALUE: 510 Ohm
MDC IDC MSMT LEADCHNL RV PACING THRESHOLD PULSEWIDTH: 0.6 ms
MDC IDC MSMT LEADCHNL RV SENSING INTR AMPL: 12 mV
MDC IDC PG SERIAL: 7151645
MDC IDC SESS DTM: 20160720074216
MDC IDC SET LEADCHNL RA PACING AMPLITUDE: 2.5 V
MDC IDC STAT BRADY AP VS PERCENT: 66 %
MDC IDC STAT BRADY AS VP PERCENT: 1 %

## 2014-12-27 ENCOUNTER — Other Ambulatory Visit: Payer: Self-pay

## 2014-12-27 ENCOUNTER — Encounter: Payer: Self-pay | Admitting: Cardiology

## 2014-12-27 MED ORDER — APIXABAN 2.5 MG PO TABS: 2.5000 mg | ORAL_TABLET | Freq: Two times a day (BID) | ORAL | Status: AC

## 2014-12-28 NOTE — Progress Notes (Signed)
Cardiology Office Note   Date:  12/29/2014   ID:  Darrell Mulder, MD, DOB September 08, 1918, MRN [id number]  PCP:  [name], MD  Cardiologist:  [name], MD   Chief Complaint  Patient presents with  . 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 Patient has DDD pacemaker.   He is doing well. He has had to occasionally use metolazone to keep his weight and height are metric. No orthopnea PND. His appetite is good. Urinary output is been great. No episodes of syncope.  Past Medical History  Diagnosis Date  . Hyperlipidemia   . Spinal stenosis   . Anemia   . Carotid artery disease     a. s/p prior carotid endarterectomy.  [name] PVD (peripheral vascular disease)   . Hyponatremia     Chronic  . CVA (cerebral infarction)     a. When asked to clarify patient says he was told thumb numbness may be TIA. He does not recall formal stroke. CT head results are only available through [facility], not in [facility].  . CKD (chronic kidney disease) stage 4, GFR 15-29 ml/min   . Long term (current) use of anticoagulants   . PAF (paroxysmal atrial fibrillation)     a. Asymptomatic. On Eliquis. CHADSVASC 7.  . Sick sinus syndrome     With DDD St. Jude PM, initially placed in 1993 by Dr. Nils Pyle. Device upgrade 10/2002 to DDD, by Dr. Rollene Fare complicated by bleeding. Subsequent gen change 2011.  [name] Hypertensive cardiovascular disease   . Chronic diastolic congestive heart failure     ECHO 05/20/12 LVEF estimated by 2D at 55-60%  . Arthritis   . Gait disorder   . Diabetic peripheral neuropathy associated with type 2 diabetes mellitus   . Diabetes mellitus     insulin dependent  . Elevated troponin I level 06/22/2014   a. 06/2014 felt due to demand ischemia.  . Chronic anticoagulation     For PAF, on Eliquis   . Urinary retention   . Pulmonary hypertension   . Foot drop, left 07/21/2014  . Asterixis 07/21/2014  . Hypertension   . Presence of permanent cardiac pacemaker   . Pneumonia     hx of pneumonia x 1     Past Surgical History  Procedure Laterality Date  . Lumbar laminectomy  1995  . Back surgery    . Total knee arthroplasty Left   . Quadriceps tendon repair    . Cataract extraction    . Carotid endarterectomy    . Yag laser application Right 0/25/8527    Procedure: YAG LASER CAPSULOTOMY OF RIGHT EYE;  Surgeon: [name], MD;  Location: [facility];  Service: Ophthalmology;  Laterality: Right;  . Tonsillectomy    . Pacemaker insertion      DDD St. Jude PM, initially placed in 1993 by Dr. Nils Pyle. Device upgrade 10/2002 to DDD, by Dr. Rollene Fare complicated by bleeding.  . Carotid endarterectomy Right 2006  . Transurethral resection of prostate    . Cardioversion N/A 06/16/2013    Procedure: CARDIOVERSION BEDSIDE;  Surgeon: [name], MD;  Location: [facility];  Service: Cardiovascular;  Laterality: N/A;  . Pacemaker generator change  12/09/2009    SJM [employer]  DR RF gen change by Dr [name]  . Insert / replace / remove pacemaker      2004   . Transurethral resection of prostate N/A 08/27/2014    Procedure: TRANSURETHRAL RESECTION OF THE PROSTATE WITH GYRUS INSTRUMENTS;  Surgeon: [name], MD;  [facility];  Service: Urology;  Laterality: N/A;  . Cystoscopy N/A 08/27/2014    Procedure: CYSTOSCOPY;  Surgeon: [name], MD;  Location: [facility];  Service: Urology;  Laterality: N/A;     Current Outpatient Prescriptions  Medication Sig Dispense Refill  . allopurinol (ZYLOPRIM) 100 MG tablet Take 100 mg by mouth 2 (two) times daily.     [name] amiodarone (PACERONE) 200 MG tablet Take 1 tablet (200 mg total) by mouth at bedtime. 30 tablet 6  . apixaban (ELIQUIS) 2.5 MG TABS tablet Take 1  tablet (2.5 mg total) by mouth 2 (two) times daily. 60 tablet 11  . Carboxymethylcellul-Glycerin 0.5-0.9 % SOLN Place 1 drop into both eyes 3 (three) times daily as needed (dry eyes).    . ferrous sulfate 325 (65 FE) MG tablet Take 325 mg by mouth every other day.    . fexofenadine (ALLEGRA) 180 MG tablet Take 180 mg by mouth at bedtime.     . insulin aspart (NOVOLOG FLEXPEN) 100 UNIT/ML FlexPen Inject 0-10 Units into the skin 3 (three) times daily with meals. Per sliding scale:  CBG <175 no insulin (unless consuming large amounts of carbs; 6-10 units for CBG >175 based on actual CBG and number of carbs in meal; >325 call MD    . insulin glargine (LANTUS) 100 UNIT/ML injection Inject 10 Units into the skin at bedtime.    . isosorbide mononitrate (IMDUR) 60 MG 24 hr tablet Take 1 tablet (60 mg total) by mouth daily. 30 tablet 5  . metolazone (ZAROXOLYN) 2.5 MG tablet Take 1 tablet (2.5 mg total) by mouth daily as needed (take 30 min prior to Demadex, if weight goes up 3 pounds from dry weigth or develops shortness of breath). (Patient taking differently: Take 2.5 mg by mouth daily as needed (take 30 min prior to Demadex, if weight is 178 or over). ) 30 tablet 0  . metoprolol tartrate (LOPRESSOR) 25 MG tablet Take 1 tablet (25 mg total) by mouth daily. 30 tablet 6  . nitroGLYCERIN (NITROSTAT) 0.4 MG SL tablet Place 1 tablet (0.4 mg total) under the tongue every 5 (five) minutes as needed for chest pain. 25 tablet 3  . OVER THE COUNTER MEDICATION Take 1 packet by mouth daily. Nature Made Diabetic Pak (6 tablet combo)    . polyethylene glycol (MIRALAX / GLYCOLAX) packet Take 17 g by mouth 2 (two) times daily.     . potassium chloride SA (K-DUR,KLOR-CON) 20 MEQ tablet Take 1 tablet (20 mEq total) by mouth daily. 30 tablet 6  . Tafluprost 0.0015 % SOLN Place 1 drop into both eyes at bedtime. ZIOPTAN    . torsemide (DEMADEX) 20 MG tablet Take 4 tablets (80 mg total) by mouth daily. 120 tablet 11   No  current facility-administered medications for this visit.    Allergies:   Statins; Aggrenox; Carvedilol; Claritin; Cymbalta; Mucinex; Phenergan; Plavix; Rapaflo; and Travatan z    Social History:  The patient  reports that he quit smoking about 56 years ago. His smoking use included Pipe and Cigarettes. He quit after 2 years of use. He has never used smokeless tobacco. He reports that he does not drink alcohol or use illicit drugs.  Family History:  The patient's family history includes COPD in his sister; Healthy in his sister; Heart failure in his sister; Hypertension in his mother; Multiple myeloma in his father.    ROS:  Please see the history of present illness.   Otherwise, review of systems are positive for lumbar disc, and knee arthritis..   All other systems are reviewed and negative.    PHYSICAL EXAM: VS:  BP 120/72 mmHg  Pulse 60  Ht '5\' 8"'  (1.727 m)  Wt 80.65 kg (177 lb 12.8 oz)  BMI 27.04 kg/m2  SpO2 99% , BMI Body mass index is 27.04 kg/(m^2). GEN: Well nourished, well developed, in no acute distress HEENT: normal Neck: no JVD, carotid bruits, or masses Cardiac: RRR; no murmurs, rubs, or gallops,no edema  Respiratory:  clear to auscultation bilaterally, normal work of breathing GI: soft, nontender, nondistended, + BS MS: no deformity or atrophy Skin: warm and dry, no rash Neuro:  Strength and sensation are intact Psych: euthymic mood, full affect   EKG:  EKG is not ordered today.    Recent Labs: 07/16/2014: Pro B Natriuretic peptide (BNP) 460.0* 10/04/2014: B Natriuretic Peptide 467.9* 10/14/2014: Magnesium 2.4 12/08/2014: ALT 63*; BUN 53*; Creatinine, Ser 2.86*; Hemoglobin 12.7*; Platelets 178.0; Potassium 4.3; Sodium 137; TSH 1.02    Lipid Panel No results found for: CHOL, TRIG, HDL, CHOLHDL, VLDL, LDLCALC, LDLDIRECT    Wt Readings from Last 3 Encounters:  12/29/14 80.65 kg (177 lb 12.8 oz)  12/08/14 80.922 kg (178 lb 6.4 oz)  11/03/14 80.377 kg (177 lb  3.2 oz)      Other studies Reviewed: Additional studies/ records that were reviewed today include: .    ASSESSMENT AND PLAN:  1. Chronic diastolic heart failure Volume status is stable  2. Paroxysmal atrial fibrillation, DCCV 06/16/13 No episodes of PSVT or A. fib and been noted clinically  3. Hypertensive heart disease with heart failure Clinically stable  4. Long term current use of amiodarone No side effects  5. CKD (chronic kidney disease) stage 4, GFR 15-29 ml/min Stage IV chronic kidney disease.  6. Chronic anticoagulation No bleeding on current regimen  7. Pacemaker - St Jude Normal pacemaker function when last evaluated    Current medicines are reviewed at length with the patient today.  The patient does not have concerns regarding medicines.  The following changes have been made:  Continue to monitor his sodium intake. Use metolazone to keep his weight less than 178 lbs.  Labs/ tests ordered today include:   Orders Placed This Encounter  Procedures  . Basic metabolic panel     Disposition:   FU with HS in 6 weeks  Signed, [name], MD  12/29/2014 6:25 PM    [address], [place], [place]  [address] Phone: [phone]; Fax: [phone]

## 2014-12-29 ENCOUNTER — Encounter: Payer: Self-pay | Admitting: Interventional Cardiology

## 2014-12-29 ENCOUNTER — Ambulatory Visit (INDEPENDENT_AMBULATORY_CARE_PROVIDER_SITE_OTHER): Payer: Medicare Other | Admitting: Interventional Cardiology

## 2014-12-29 VITALS — BP 120/72 | HR 60 | Ht 68.0 in | Wt 177.8 lb

## 2014-12-29 DIAGNOSIS — I5032 Chronic diastolic (congestive) heart failure: Secondary | ICD-10-CM | POA: Diagnosis not present

## 2014-12-29 DIAGNOSIS — Z7901 Long term (current) use of anticoagulants: Secondary | ICD-10-CM

## 2014-12-29 DIAGNOSIS — Z950 Presence of cardiac pacemaker: Secondary | ICD-10-CM

## 2014-12-29 DIAGNOSIS — I509 Heart failure, unspecified: Secondary | ICD-10-CM

## 2014-12-29 DIAGNOSIS — I480 Paroxysmal atrial fibrillation: Secondary | ICD-10-CM

## 2014-12-29 DIAGNOSIS — N184 Chronic kidney disease, stage 4 (severe): Secondary | ICD-10-CM

## 2014-12-29 DIAGNOSIS — I110 Hypertensive heart disease with heart failure: Secondary | ICD-10-CM | POA: Diagnosis not present

## 2014-12-29 DIAGNOSIS — Z79899 Other long term (current) drug therapy: Secondary | ICD-10-CM

## 2014-12-29 DIAGNOSIS — I208 Other forms of angina pectoris: Secondary | ICD-10-CM | POA: Diagnosis not present

## 2014-12-29 MED ORDER — TORSEMIDE 20 MG PO TABS: 80.0000 mg | ORAL_TABLET | Freq: Every day | ORAL | Status: DC

## 2014-12-29 NOTE — Patient Instructions (Signed)
Medication Instructions:  Your physician recommends that you continue on your current medications as directed. Please refer to the Current Medication list given to you today.  A refill Rx for Torsemide has been sent to your pharmacy Labwork: Bmet today   Testing/Procedures: None   Follow-Up: You have a follow up appointment scheduled on 02/10/15 @ 10am  Any Other Special Instructions Will Be Listed Below (If Applicable).

## 2014-12-30 LAB — BASIC METABOLIC PANEL
BUN: 68 mg/dL — AB (ref 6–23)
CHLORIDE: 88 meq/L — AB (ref 96–112)
CO2: 41 meq/L — AB (ref 19–32)
Calcium: 10.2 mg/dL (ref 8.4–10.5)
Creatinine, Ser: 3.19 mg/dL — ABNORMAL HIGH (ref 0.40–1.50)
GFR: 23.44 mL/min — ABNORMAL LOW (ref >60.00)
GLUCOSE: 172 mg/dL — AB (ref 70–99)
POTASSIUM: 3.7 meq/L (ref 3.5–5.1)
SODIUM: 135 meq/L (ref 135–145)

## 2015-01-03 ENCOUNTER — Telehealth: Payer: Self-pay

## 2015-01-03 DIAGNOSIS — I5032 Chronic diastolic (congestive) heart failure: Secondary | ICD-10-CM

## 2015-01-03 MED ORDER — TORSEMIDE 20 MG PO TABS: 60.0000 mg | ORAL_TABLET | Freq: Every day | ORAL | Status: DC

## 2015-01-03 NOTE — Telephone Encounter (Signed)
Pt aware of lab results and Dr.Smith's recommendation. Kidney function is declining. Decrease torsemide to 60 mg per day. No change in dosing of metolazone. Call if refractory weight gain. Basic metabolic panel in 0-78 days. Lab appt scheduled for 8/29. Pt verbalized understanding.

## 2015-01-03 NOTE — Telephone Encounter (Signed)
-----   Message from Belva Crome, MD sent at 12/31/2014  3:27 PM EDT ----- Kidney function is declining. Decrease torsemide to 60 mg per day. No change in dosing of metolazone. Call if refractory weight gain. Basic metabolic panel in 6-29 days.

## 2015-01-04 ENCOUNTER — Encounter: Payer: Self-pay | Admitting: Internal Medicine

## 2015-01-10 ENCOUNTER — Other Ambulatory Visit: Payer: Medicare Other

## 2015-01-12 ENCOUNTER — Other Ambulatory Visit (INDEPENDENT_AMBULATORY_CARE_PROVIDER_SITE_OTHER): Payer: Medicare Other | Admitting: *Deleted

## 2015-01-12 ENCOUNTER — Encounter: Payer: Self-pay | Admitting: Cardiology

## 2015-01-12 DIAGNOSIS — I5032 Chronic diastolic (congestive) heart failure: Secondary | ICD-10-CM

## 2015-01-12 LAB — BASIC METABOLIC PANEL
BUN: 61 mg/dL — ABNORMAL HIGH (ref 6–23)
CALCIUM: 9.8 mg/dL (ref 8.4–10.5)
CO2: 36 mEq/L — ABNORMAL HIGH (ref 19–32)
Chloride: 90 mEq/L — ABNORMAL LOW (ref 96–112)
Creatinine, Ser: 3.16 mg/dL — ABNORMAL HIGH (ref 0.40–1.50)
GFR: 23.69 mL/min — AB (ref >60.00)
Glucose, Bld: 281 mg/dL — ABNORMAL HIGH (ref 70–99)
Potassium: 3.5 mEq/L (ref 3.5–5.1)
SODIUM: 134 meq/L — AB (ref 135–145)

## 2015-02-06 NOTE — Progress Notes (Signed)
Cardiology Office Note   Date:  02/10/2015   ID:  Darrell Mulder, MD, DOB 1919-02-04, MRN [id number]  PCP:  [name], MD  Cardiologist:  [name], MD   Chief Complaint  Patient presents with  . 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 We have maintained weights in the 175-177 pound range consistently. He follows the sliding scale diuretic regimen as previously constructed. He denies angina. He has not had orthostatic dizziness or orthopnea. There've been no episodes of tachycardia. No bleeding or neurological complaints.    Past Medical History  Diagnosis Date  . Hyperlipidemia   . Spinal stenosis   . Anemia   . Carotid artery disease     a. s/p prior carotid endarterectomy.  [name] PVD (peripheral vascular disease)   . Hyponatremia     Chronic  . CVA (cerebral infarction)     a. When asked to clarify patient says he was told thumb numbness may be TIA. He does not recall formal stroke. CT head results are only available through [facility], not in [facility].  . CKD (chronic kidney disease) stage 4, GFR 15-29 ml/min   . Long term (current) use of anticoagulants   . PAF (paroxysmal atrial fibrillation)     a. Asymptomatic. On Eliquis. CHADSVASC 7.  . Sick sinus syndrome     With DDD St. Jude PM, initially placed in 1993 by Dr. Nils Pyle. Device upgrade 10/2002 to DDD, by Dr. Rollene Fare complicated by bleeding. Subsequent gen change 2011.  [name] Hypertensive cardiovascular disease   . Chronic diastolic congestive heart failure     ECHO 05/20/12 LVEF estimated by 2D at 55-60%  . Arthritis   . Gait disorder   . Diabetic peripheral neuropathy associated with type 2 diabetes mellitus   .  Diabetes mellitus     insulin dependent  . Elevated troponin I level 06/22/2014    a. 06/2014 felt due to demand ischemia.  . Chronic anticoagulation     For PAF, on Eliquis   . Urinary retention   . Pulmonary hypertension   . Foot drop, left 07/21/2014  . Asterixis 07/21/2014  . Hypertension   . Presence of permanent cardiac pacemaker   . Pneumonia     hx of pneumonia x 1     Past Surgical History  Procedure Laterality Date  . Lumbar laminectomy  1995  . Back surgery    . Total knee arthroplasty Left   . Quadriceps tendon repair    . Cataract extraction    . Carotid endarterectomy    . Yag laser application Right 6/50/3546    Procedure: YAG LASER CAPSULOTOMY OF RIGHT EYE;  Surgeon: [name], MD;  Location: [facility];  Service: Ophthalmology;  Laterality: Right;  . Tonsillectomy    . Pacemaker insertion      DDD St. Jude PM, initially placed in 1993 by Dr. Nils Pyle. Device upgrade 10/2002 to DDD, by Dr. Rollene Fare complicated by bleeding.  . Carotid endarterectomy Right 2006  . Transurethral resection of prostate    . Cardioversion N/A 06/16/2013    Procedure: CARDIOVERSION BEDSIDE;  Surgeon: [name], MD;  Location: [facility];  Service: Cardiovascular;  Laterality: N/A;  . Pacemaker  generator change  12/09/2009    SJM Accent DR RF gen change by Dr [name]  . Insert / replace / remove pacemaker      2004   . Transurethral resection of prostate N/A 08/27/2014    Procedure: TRANSURETHRAL RESECTION OF THE PROSTATE WITH GYRUS INSTRUMENTS;  Surgeon: [name], MD;  [facility];  Service: Urology;  Laterality: N/A;  . Cystoscopy N/A 08/27/2014    Procedure: CYSTOSCOPY;  Surgeon: [name], MD;  [facility];  Service: Urology;  Laterality: N/A;     Current Outpatient Prescriptions  Medication Sig Dispense Refill  . allopurinol (ZYLOPRIM) 100 MG tablet Take 100 mg by mouth 2 (two) times daily.     [name] amiodarone (PACERONE) 200 MG tablet Take 1 tablet (200 mg total) by  mouth at bedtime. 30 tablet 6  . apixaban (ELIQUIS) 2.5 MG TABS tablet Take 1 tablet (2.5 mg total) by mouth 2 (two) times daily. 60 tablet 11  . Carboxymethylcellul-Glycerin 0.5-0.9 % SOLN Place 1 drop into both eyes 3 (three) times daily as needed (dry eyes).    . ferrous sulfate 325 (65 FE) MG tablet Take 325 mg by mouth every other day.    . fexofenadine (ALLEGRA) 180 MG tablet Take 180 mg by mouth at bedtime.     . insulin aspart (NOVOLOG FLEXPEN) 100 UNIT/ML FlexPen Inject 0-10 Units into the skin 3 (three) times daily with meals. Per sliding scale:  CBG <175 no insulin (unless consuming large amounts of carbs; 6-10 units for CBG >175 based on actual CBG and number of carbs in meal; >325 call MD    . insulin glargine (LANTUS) 100 UNIT/ML injection Inject 10 Units into the skin at bedtime.    . isosorbide mononitrate (IMDUR) 60 MG 24 hr tablet Take 1 tablet (60 mg total) by mouth daily. 30 tablet 5  . metolazone (ZAROXOLYN) 2.5 MG tablet Take 1 tablet (2.5 mg total) by mouth daily as needed (take 30 min prior to Demadex, if weight goes up 3 pounds from dry weigth or develops shortness of breath). (Patient taking differently: Take 2.5 mg by mouth daily as needed (take 30 min prior to Demadex, if weight is 178 or over). ) 30 tablet 0  . metoprolol tartrate (LOPRESSOR) 25 MG tablet Take 1 tablet (25 mg total) by mouth daily. 30 tablet 6  . nitroGLYCERIN (NITROSTAT) 0.4 MG SL tablet Place 1 tablet (0.4 mg total) under the tongue every 5 (five) minutes as needed for chest pain. 25 tablet 3  . OVER THE COUNTER MEDICATION Take 1 packet by mouth daily. Nature Made Diabetic Pak (6 tablet combo)    . polyethylene glycol (MIRALAX / GLYCOLAX) packet Take 17 g by mouth 2 (two) times daily.     . potassium chloride SA (K-DUR,KLOR-CON) 20 MEQ tablet Take 1 tablet (20 mEq total) by mouth daily. 30 tablet 6  . Tafluprost 0.0015 % SOLN Place 1 drop into both eyes at bedtime. ZIOPTAN    . torsemide (DEMADEX) 20 MG  tablet Take 3 tablets (60 mg total) by mouth daily.     No current facility-administered medications for this visit.    Allergies:   Statins; Aggrenox; Carvedilol; Claritin; Cymbalta; Mucinex; Phenergan; Plavix; Rapaflo; and Travatan z    Social History:  The patient  reports that he quit smoking about 56 years ago. His smoking use included Pipe and Cigarettes. He quit after 2 years of use. He has never used smokeless tobacco. He reports that he  does not drink alcohol or use illicit drugs.   Family History:  The patient's family history includes COPD in his sister; Healthy in his sister; Heart failure in his sister; Hypertension in his mother; Multiple myeloma in his father.    ROS:  Please see the history of present illness.   Otherwise, review of systems are positive for vertigo for which he has exercises to keep under control, joint discomfort, depression and low energy when he has to stay at home from Friday through Sunday. He still goes into the office every day except those stays mentioned. Constipation is a wild card with following weights to manage heart failure causing 1-2 pound variations depending upon whether he has had a bowel movement..   All other systems are reviewed and negative.    PHYSICAL EXAM: VS:  BP 132/60 mmHg  Pulse 64  Ht _0  (1.727 m)  Wt 80.65 kg (177 lb 12.8 oz)  BMI 27.04 kg/m2 , BMI Body mass index is 27.04 kg/(m^2). GEN: Well nourished, well developed, in no acute distress HEENT: normal Neck: no JVD, carotid bruits, or masses Cardiac: RRR.  There is no murmur, rub, or gallop. There is no edema. Respiratory:  clear to auscultation bilaterally, normal work of breathing. GI: soft, nontender, nondistended, + BS MS: no deformity or atrophy Skin: warm and dry, no rash Neuro:  Strength and sensation are intact Psych: euthymic mood, full affect   EKG:  EKG is not ordered today.    Recent Labs: 07/16/2014: Pro B Natriuretic peptide (BNP)  460.0* 10/04/2014: B Natriuretic Peptide 467.9* 10/14/2014: Magnesium 2.4 12/08/2014: ALT 63*; Hemoglobin 12.7*; Platelets 178.0; TSH 1.02 01/12/2015: BUN 61*; Creatinine, Ser 3.16*; Potassium 3.5; Sodium 134*    Lipid Panel No results found for: CHOL, TRIG, HDL, CHOLHDL, VLDL, LDLCALC, LDLDIRECT    Wt Readings from Last 3 Encounters:  02/10/15 80.65 kg (177 lb 12.8 oz)  12/29/14 80.65 kg (177 lb 12.8 oz)  12/08/14 80.922 kg (178 lb 6.4 oz)      Other studies Reviewed: Additional studies/ records that were reviewed today include: Reviewed prior laboratory data. No recent emergency room visits.. The findings include kidney function in the stage 4 chronic kidney disease range one month ago.    ASSESSMENT AND PLAN:  1. Chronic diastolic heart failure There is no clinical evidence of volume overload  2. Essential hypertension The blood pressure is under excellent control  3. Paroxysmal atrial fibrillation, DCCV 06/16/13 No clinical recurrences of atrial fibrillation  4. CKD (chronic kidney disease) stage 4, GFR 15-29 ml/min As noted above. Stage IV chronic kidney disease. All radial load dose Eliquis.  5. Type 2 diabetes mellitus with peripheral neuropathy Followed by Dr. Jeanann Lewandowsky  6. Chronic anticoagulation No bleeding complications    Current medicines are reviewed at length with the patient today.  The patient has the following concerns regarding medicines: None..  The following changes/actions have been instituted:    Basic metabolic panel now and on return in one month  Call if sustained weight increase  Okay to travel by plane to [place], with his on scale and continuing to use the sliding scale that we have established.  Labs/ tests ordered today include:  No orders of the defined types were placed in this encounter.     Disposition:   FU with HS in 1 month  Signed, [name], MD  02/10/2015 10:10 AM    [address],  [place]  [address] Phone: [phone]; Fax: [phone]

## 2015-02-10 ENCOUNTER — Encounter: Payer: Self-pay | Admitting: Interventional Cardiology

## 2015-02-10 ENCOUNTER — Ambulatory Visit (INDEPENDENT_AMBULATORY_CARE_PROVIDER_SITE_OTHER): Payer: Medicare Other | Admitting: Interventional Cardiology

## 2015-02-10 VITALS — BP 132/60 | HR 64 | Ht 68.0 in | Wt 177.8 lb

## 2015-02-10 DIAGNOSIS — E1142 Type 2 diabetes mellitus with diabetic polyneuropathy: Secondary | ICD-10-CM

## 2015-02-10 DIAGNOSIS — Z7901 Long term (current) use of anticoagulants: Secondary | ICD-10-CM

## 2015-02-10 DIAGNOSIS — I10 Essential (primary) hypertension: Secondary | ICD-10-CM

## 2015-02-10 DIAGNOSIS — I5032 Chronic diastolic (congestive) heart failure: Secondary | ICD-10-CM

## 2015-02-10 DIAGNOSIS — I208 Other forms of angina pectoris: Secondary | ICD-10-CM

## 2015-02-10 DIAGNOSIS — I480 Paroxysmal atrial fibrillation: Secondary | ICD-10-CM

## 2015-02-10 DIAGNOSIS — N184 Chronic kidney disease, stage 4 (severe): Secondary | ICD-10-CM

## 2015-02-10 DIAGNOSIS — E1140 Type 2 diabetes mellitus with diabetic neuropathy, unspecified: Secondary | ICD-10-CM

## 2015-02-10 NOTE — Patient Instructions (Signed)
Medication Instructions:  Your physician recommends that you continue on your current medications as directed. Please refer to the Current Medication list given to you today.   Labwork: Return next week for a Bmet  Testing/Procedures: None ordered  Follow-Up: You have a follow up appointment scheduled on 03/08/15 @ 9am  Any Other Special Instructions Will Be Listed Below (If Applicable).

## 2015-02-14 ENCOUNTER — Telehealth: Payer: Self-pay

## 2015-02-14 DIAGNOSIS — I5032 Chronic diastolic (congestive) heart failure: Secondary | ICD-10-CM

## 2015-02-14 NOTE — Telephone Encounter (Signed)
Pt will come to the office to have a bmet on 10/4.

## 2015-02-15 ENCOUNTER — Other Ambulatory Visit (INDEPENDENT_AMBULATORY_CARE_PROVIDER_SITE_OTHER): Payer: Medicare Other | Admitting: *Deleted

## 2015-02-15 ENCOUNTER — Other Ambulatory Visit: Payer: Medicare Other

## 2015-02-15 ENCOUNTER — Telehealth: Payer: Self-pay

## 2015-02-15 DIAGNOSIS — I5032 Chronic diastolic (congestive) heart failure: Secondary | ICD-10-CM | POA: Diagnosis not present

## 2015-02-15 LAB — BASIC METABOLIC PANEL
BUN: 55 mg/dL — ABNORMAL HIGH (ref 7–25)
CHLORIDE: 95 mmol/L — AB (ref 98–110)
CO2: 27 mmol/L (ref 20–31)
CREATININE: 2.99 mg/dL — AB (ref 0.70–1.11)
Calcium: 9.9 mg/dL (ref 8.6–10.3)
Glucose, Bld: 309 mg/dL — ABNORMAL HIGH (ref 65–99)
POTASSIUM: 4.6 mmol/L (ref 3.5–5.3)
SODIUM: 133 mmol/L — AB (ref 135–146)

## 2015-02-15 NOTE — Telephone Encounter (Signed)
Pt will got to [facility] today to have a bmet drawn

## 2015-02-24 ENCOUNTER — Telehealth: Payer: Self-pay | Admitting: Interventional Cardiology

## 2015-02-24 NOTE — Telephone Encounter (Signed)
Ronalee Belts, pharmacist  at [facility] pt was prescribed ZPac by MD in [place] states pt has already taken a couple of doses started in hospital in [place] and pt was to continue for 5 more doses.   Ronalee Belts advised I will forward to Dr [name] for review.

## 2015-02-24 NOTE — Telephone Encounter (Signed)
New message      Calling to make sure it is ok with Dr [name] for pt to start z-pack while on amiodarone.  A doctor in [place] prescribed this medication

## 2015-02-24 NOTE — Telephone Encounter (Signed)
Per Dr Nicoletta Ba for pt  to take ZPac for 5 more doses.   Ronalee Belts at [facility] advised [facility] for pt to take ZPac for 5 more doses.

## 2015-02-27 ENCOUNTER — Encounter (HOSPITAL_COMMUNITY): Payer: Self-pay | Admitting: Emergency Medicine

## 2015-02-27 ENCOUNTER — Observation Stay (HOSPITAL_COMMUNITY): Payer: Medicare Other

## 2015-02-27 ENCOUNTER — Emergency Department (HOSPITAL_COMMUNITY): Payer: Medicare Other

## 2015-02-27 ENCOUNTER — Observation Stay (HOSPITAL_COMMUNITY)
Admission: EM | Admit: 2015-02-27 | Discharge: 2015-03-01 | Disposition: A | Discharge status: Home / Self Care | Payer: Medicare Other | Attending: Internal Medicine | Admitting: Internal Medicine

## 2015-02-27 DIAGNOSIS — J209 Acute bronchitis, unspecified: Secondary | ICD-10-CM | POA: Insufficient documentation

## 2015-02-27 DIAGNOSIS — I480 Paroxysmal atrial fibrillation: Secondary | ICD-10-CM | POA: Insufficient documentation

## 2015-02-27 DIAGNOSIS — Z7901 Long term (current) use of anticoagulants: Secondary | ICD-10-CM | POA: Diagnosis not present

## 2015-02-27 DIAGNOSIS — R338 Other retention of urine: Secondary | ICD-10-CM

## 2015-02-27 DIAGNOSIS — Z79899 Other long term (current) drug therapy: Secondary | ICD-10-CM

## 2015-02-27 DIAGNOSIS — E1142 Type 2 diabetes mellitus with diabetic polyneuropathy: Secondary | ICD-10-CM | POA: Diagnosis not present

## 2015-02-27 DIAGNOSIS — M79671 Pain in right foot: Secondary | ICD-10-CM | POA: Diagnosis not present

## 2015-02-27 DIAGNOSIS — M79672 Pain in left foot: Secondary | ICD-10-CM | POA: Diagnosis not present

## 2015-02-27 DIAGNOSIS — R29898 Other symptoms and signs involving the musculoskeletal system: Secondary | ICD-10-CM

## 2015-02-27 DIAGNOSIS — Z87891 Personal history of nicotine dependence: Secondary | ICD-10-CM | POA: Diagnosis not present

## 2015-02-27 DIAGNOSIS — M109 Gout, unspecified: Secondary | ICD-10-CM

## 2015-02-27 DIAGNOSIS — N401 Enlarged prostate with lower urinary tract symptoms: Secondary | ICD-10-CM

## 2015-02-27 DIAGNOSIS — M1009 Idiopathic gout, multiple sites: Secondary | ICD-10-CM | POA: Diagnosis present

## 2015-02-27 DIAGNOSIS — E785 Hyperlipidemia, unspecified: Secondary | ICD-10-CM | POA: Insufficient documentation

## 2015-02-27 DIAGNOSIS — N184 Chronic kidney disease, stage 4 (severe): Secondary | ICD-10-CM | POA: Diagnosis not present

## 2015-02-27 DIAGNOSIS — I5032 Chronic diastolic (congestive) heart failure: Secondary | ICD-10-CM | POA: Insufficient documentation

## 2015-02-27 DIAGNOSIS — Z794 Long term (current) use of insulin: Secondary | ICD-10-CM | POA: Insufficient documentation

## 2015-02-27 DIAGNOSIS — E860 Dehydration: Secondary | ICD-10-CM

## 2015-02-27 DIAGNOSIS — M79606 Pain in leg, unspecified: Secondary | ICD-10-CM | POA: Diagnosis present

## 2015-02-27 DIAGNOSIS — I10 Essential (primary) hypertension: Secondary | ICD-10-CM | POA: Diagnosis present

## 2015-02-27 DIAGNOSIS — R42 Dizziness and giddiness: Secondary | ICD-10-CM

## 2015-02-27 DIAGNOSIS — R269 Unspecified abnormalities of gait and mobility: Secondary | ICD-10-CM

## 2015-02-27 DIAGNOSIS — Z8673 Personal history of transient ischemic attack (TIA), and cerebral infarction without residual deficits: Secondary | ICD-10-CM | POA: Diagnosis not present

## 2015-02-27 DIAGNOSIS — I2510 Atherosclerotic heart disease of native coronary artery without angina pectoris: Secondary | ICD-10-CM | POA: Diagnosis not present

## 2015-02-27 DIAGNOSIS — I129 Hypertensive chronic kidney disease with stage 1 through stage 4 chronic kidney disease, or unspecified chronic kidney disease: Secondary | ICD-10-CM | POA: Diagnosis not present

## 2015-02-27 DIAGNOSIS — M722 Plantar fascial fibromatosis: Secondary | ICD-10-CM

## 2015-02-27 DIAGNOSIS — Z950 Presence of cardiac pacemaker: Secondary | ICD-10-CM | POA: Diagnosis not present

## 2015-02-27 DIAGNOSIS — M79605 Pain in left leg: Secondary | ICD-10-CM

## 2015-02-27 DIAGNOSIS — M79604 Pain in right leg: Secondary | ICD-10-CM

## 2015-02-27 DIAGNOSIS — I110 Hypertensive heart disease with heart failure: Secondary | ICD-10-CM

## 2015-02-27 DIAGNOSIS — R911 Solitary pulmonary nodule: Secondary | ICD-10-CM

## 2015-02-27 DIAGNOSIS — M21372 Foot drop, left foot: Secondary | ICD-10-CM

## 2015-02-27 LAB — CBC WITH DIFFERENTIAL/PLATELET
BASOS ABS: 0 10*3/uL (ref 0.0–0.1)
BASOS PCT: 0 %
EOS ABS: 0.1 10*3/uL (ref 0.0–0.7)
EOS PCT: 1 %
HCT: 35.7 % — ABNORMAL LOW (ref 39.0–52.0)
Hemoglobin: 12 g/dL — ABNORMAL LOW (ref 13.0–17.0)
LYMPHS PCT: 26 %
Lymphs Abs: 2 10*3/uL (ref 0.7–4.0)
MCH: 33.5 pg (ref 26.0–34.0)
MCHC: 33.6 g/dL (ref 30.0–36.0)
MCV: 99.7 fL (ref 78.0–100.0)
Monocytes Absolute: 0.5 10*3/uL (ref 0.1–1.0)
Monocytes Relative: 7 %
Neutro Abs: 5.1 10*3/uL (ref 1.7–7.7)
Neutrophils Relative %: 66 %
PLATELETS: 163 10*3/uL (ref 150–400)
RBC: 3.58 MIL/uL — AB (ref 4.22–5.81)
RDW: 15.5 % (ref 11.5–15.5)
WBC: 7.7 10*3/uL (ref 4.0–10.5)

## 2015-02-27 LAB — BASIC METABOLIC PANEL
Anion gap: 10 (ref 5–15)
BUN: 51 mg/dL — ABNORMAL HIGH (ref 6–20)
CALCIUM: 9.7 mg/dL (ref 8.9–10.3)
CO2: 29 mmol/L (ref 22–32)
Chloride: 97 mmol/L — ABNORMAL LOW (ref 101–111)
Creatinine, Ser: 3.04 mg/dL — ABNORMAL HIGH (ref 0.61–1.24)
GFR calc Af Amer: 19 mL/min — ABNORMAL LOW (ref >60)
GFR, EST NON AFRICAN AMERICAN: 16 mL/min — AB (ref >60)
Glucose, Bld: 177 mg/dL — ABNORMAL HIGH (ref 65–99)
POTASSIUM: 4.2 mmol/L (ref 3.5–5.1)
SODIUM: 136 mmol/L (ref 135–145)

## 2015-02-27 LAB — URIC ACID: URIC ACID, SERUM: 5.8 mg/dL (ref 4.4–7.6)

## 2015-02-27 LAB — GLUCOSE, CAPILLARY
GLUCOSE-CAPILLARY: 257 mg/dL — AB (ref 65–99)
GLUCOSE-CAPILLARY: 229 mg/dL — AB (ref 65–99)
Glucose-Capillary: 168 mg/dL — ABNORMAL HIGH (ref 65–99)

## 2015-02-27 LAB — BRAIN NATRIURETIC PEPTIDE: B NATRIURETIC PEPTIDE 5: 325.4 pg/mL — AB (ref 0.0–100.0)

## 2015-02-27 MED ORDER — ONDANSETRON HCL 4 MG/2ML IJ SOLN: 4.0000 mg | Freq: Four times a day (QID) | INTRAMUSCULAR | Status: DC | PRN

## 2015-02-27 MED ORDER — LATANOPROST 0.005 % OP SOLN
1.0000 [drp] | Freq: Every day | OPHTHALMIC | Status: DC
  Administered 2015-02-28: 1 [drp] via OPHTHALMIC
  Filled 2015-02-27: qty 2.5

## 2015-02-27 MED ORDER — POLYVINYL ALCOHOL 1.4 % OP SOLN
1.0000 [drp] | Freq: Two times a day (BID) | OPHTHALMIC | Status: DC
  Filled 2015-02-27: qty 15

## 2015-02-27 MED ORDER — ONDANSETRON HCL 4 MG PO TABS: 4.0000 mg | ORAL_TABLET | Freq: Four times a day (QID) | ORAL | Status: DC | PRN

## 2015-02-27 MED ORDER — NITROGLYCERIN 0.4 MG SL SUBL: 0.4000 mg | SUBLINGUAL_TABLET | SUBLINGUAL | Status: DC | PRN

## 2015-02-27 MED ORDER — ALLOPURINOL 100 MG PO TABS
100.0000 mg | ORAL_TABLET | Freq: Two times a day (BID) | ORAL | Status: DC
  Administered 2015-02-27 – 2015-03-01 (×5): 100 mg via ORAL
  Filled 2015-02-27 (×5): qty 1

## 2015-02-27 MED ORDER — TORSEMIDE 20 MG PO TABS
60.0000 mg | ORAL_TABLET | Freq: Every day | ORAL | Status: DC
  Administered 2015-02-27 – 2015-03-01 (×3): 60 mg via ORAL
  Filled 2015-02-27 (×3): qty 3

## 2015-02-27 MED ORDER — INSULIN GLARGINE 100 UNIT/ML ~~LOC~~ SOLN
10.0000 [IU] | Freq: Every day | SUBCUTANEOUS | Status: DC
  Administered 2015-02-28: 10 [IU] via SUBCUTANEOUS
  Filled 2015-02-27 (×2): qty 0.1

## 2015-02-27 MED ORDER — APIXABAN 2.5 MG PO TABS
2.5000 mg | ORAL_TABLET | Freq: Two times a day (BID) | ORAL | Status: DC
  Administered 2015-02-27 – 2015-03-01 (×5): 2.5 mg via ORAL
  Filled 2015-02-27 (×5): qty 1

## 2015-02-27 MED ORDER — ACETAMINOPHEN 325 MG PO TABS: 650.0000 mg | ORAL_TABLET | Freq: Four times a day (QID) | ORAL | Status: DC | PRN

## 2015-02-27 MED ORDER — CARBOXYMETHYLCELLUL-GLYCERIN 0.5-0.9 % OP SOLN: 1.0000 [drp] | Freq: Two times a day (BID) | OPHTHALMIC | Status: DC

## 2015-02-27 MED ORDER — POTASSIUM CHLORIDE CRYS ER 20 MEQ PO TBCR
20.0000 meq | EXTENDED_RELEASE_TABLET | Freq: Every day | ORAL | Status: DC
  Administered 2015-02-27 – 2015-03-01 (×3): 20 meq via ORAL
  Filled 2015-02-27 (×3): qty 1

## 2015-02-27 MED ORDER — HYDROCODONE-ACETAMINOPHEN 5-325 MG PO TABS: 1.0000 | ORAL_TABLET | ORAL | Status: DC | PRN

## 2015-02-27 MED ORDER — ISOSORBIDE MONONITRATE ER 60 MG PO TB24
60.0000 mg | ORAL_TABLET | Freq: Every day | ORAL | Status: DC
  Administered 2015-02-27 – 2015-03-01 (×3): 60 mg via ORAL
  Filled 2015-02-27 (×3): qty 1

## 2015-02-27 MED ORDER — POLYETHYLENE GLYCOL 3350 17 G PO PACK
17.0000 g | PACK | Freq: Two times a day (BID) | ORAL | Status: DC
  Administered 2015-02-27 – 2015-02-28 (×2): 17 g via ORAL
  Filled 2015-02-27 (×4): qty 1

## 2015-02-27 MED ORDER — AMIODARONE HCL 100 MG PO TABS
200.0000 mg | ORAL_TABLET | Freq: Every day | ORAL | Status: DC
  Administered 2015-02-27 – 2015-02-28 (×2): 200 mg via ORAL
  Filled 2015-02-27 (×2): qty 2

## 2015-02-27 MED ORDER — INSULIN ASPART 100 UNIT/ML ~~LOC~~ SOLN
3.0000 [IU] | Freq: Three times a day (TID) | SUBCUTANEOUS | Status: DC
  Administered 2015-02-27 – 2015-02-28 (×4): 3 [IU] via SUBCUTANEOUS

## 2015-02-27 MED ORDER — TORSEMIDE 20 MG PO TABS
60.0000 mg | ORAL_TABLET | Freq: Every day | ORAL | Status: DC
Start: 2015-02-28 — End: 2015-02-27

## 2015-02-27 MED ORDER — METOPROLOL TARTRATE 25 MG PO TABS
25.0000 mg | ORAL_TABLET | Freq: Every day | ORAL | Status: DC
  Administered 2015-02-27 – 2015-03-01 (×3): 25 mg via ORAL
  Filled 2015-02-27 (×3): qty 1

## 2015-02-27 MED ORDER — INSULIN ASPART 100 UNIT/ML ~~LOC~~ SOLN
0.0000 [IU] | Freq: Three times a day (TID) | SUBCUTANEOUS | Status: DC
  Administered 2015-02-27: 2 [IU] via SUBCUTANEOUS
  Administered 2015-02-27: 5 [IU] via SUBCUTANEOUS
  Administered 2015-02-28 (×3): 3 [IU] via SUBCUTANEOUS
  Administered 2015-03-01: 2 [IU] via SUBCUTANEOUS
  Administered 2015-03-01: 1 [IU] via SUBCUTANEOUS

## 2015-02-27 MED ORDER — ACETAMINOPHEN 650 MG RE SUPP: 650.0000 mg | Freq: Four times a day (QID) | RECTAL | Status: DC | PRN

## 2015-02-27 NOTE — ED Notes (Signed)
Patient returned from X-ray 

## 2015-02-27 NOTE — ED Notes (Signed)
Patient (and family) do not want an IV establishment. They state he has been stuck so much and he is very difficult. Patient questioned the reason behind the stick if he were not going to receive medication through it. Asked hospitalist admitting team about the IV. MD stated she was not planning to order any testing that would require IV or any medication that would require IV. She stated ok to hold off on starting it for now.
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
## 2015-02-27 NOTE — ED Notes (Signed)
Cough noted; he states he has had that about 6 days, was in the hospital in [place] until Wednesday morning for the cough. States on Erythromycin since discharge... Has two days left. Cough is improved.

## 2015-02-27 NOTE — ED Notes (Signed)
Called EMS due to pain in bilat lower legs and bilat feet. States it is gout pain. Reports the pain was so bad this morning that it made him short of breath. Arrives in no resp distress, speaking in full sentences. Does do lamaze type breathing when trying to bear weight on his feet.

## 2015-02-27 NOTE — ED Notes (Signed)
PT monitored by pulse ox, bp cuff, and 5-lead. 

## 2015-02-27 NOTE — ED Notes (Signed)
Admitting MD team at bedside.

## 2015-02-27 NOTE — ED Notes (Signed)
Patient transported to X-ray 

## 2015-02-27 NOTE — ED Provider Notes (Signed)
CSN: [id number]     Arrival date & time 02/27/15  3254 History   First MD Initiated Contact with Patient 02/27/15 [phone]     Chief Complaint  Patient presents with  . Leg Pain  . Foot Pain     (Consider location/radiation/quality/duration/timing/severity/associated sxs/prior Treatment) The history is provided by the patient and a relative.     Pt with hx DM, CHF, afib on eliquis, gout p/w bilateral heel pain that began 3 days ago.  He was recently in [place] where he was hospitalized for respiratory infection, CHF, the day after he came home he developed bilateral heel and achilles pain.  It began gradually and over two days has worsened to the point that he cannot bear weight without extreme pain.  He has had swelling in his bilateral feet.  Denies any falls, injury or trauma.  He uses a motorized wheelchair and a walker, no new shoes.  Described as "worse than gout."  Has persistent cough that is not improving from respiratory infection.  Family member notes he has been sweating recently.  Denies fevers, myalgias, CP, SOB.      Past Medical History  Diagnosis Date  . Hyperlipidemia   . Spinal stenosis   . Anemia   . Carotid artery disease ([place])     a. s/p prior carotid endarterectomy.  [name] PVD (peripheral vascular disease) ([place])   . Hyponatremia     Chronic  . CVA (cerebral infarction)     a. When asked to clarify patient says he was told thumb numbness may be TIA. He does not recall formal stroke. CT head results are only available through [facility], not in [facility].  . CKD (chronic kidney disease) stage 4, GFR 15-29 ml/min (HCC)   . Long term (current) use of anticoagulants   . PAF (paroxysmal atrial fibrillation) ([place])     a. Asymptomatic. On Eliquis. CHADSVASC 7.  . Sick sinus syndrome ([place])     With DDD St. Jude PM, initially placed in 1993 by Dr. Nils Pyle. Device upgrade 10/2002 to DDD, by Dr. Rollene Fare complicated by bleeding. Subsequent gen change 2011.  [name] Hypertensive  cardiovascular disease   . Chronic diastolic congestive heart failure ([place])     ECHO 05/20/12 LVEF estimated by 2D at 55-60%  . Arthritis   . Gait disorder   . Diabetic peripheral neuropathy associated with type 2 diabetes mellitus ([place])   . Diabetes mellitus (HCC)     insulin dependent  . Elevated troponin I level 06/22/2014    a. 06/2014 felt due to demand ischemia.  . Chronic anticoagulation     For PAF, on Eliquis   . Urinary retention   . Pulmonary hypertension ([place])   . Foot drop, left 07/21/2014  . Asterixis 07/21/2014  . Hypertension   . Presence of permanent cardiac pacemaker   . Pneumonia     hx of pneumonia x 1    Past Surgical History  Procedure Laterality Date  . Lumbar laminectomy  1995  . Back surgery    . Total knee arthroplasty Left   . Quadriceps tendon repair    . Cataract extraction    . Carotid endarterectomy    . Yag laser application Right 08/27/8307    Procedure: YAG LASER CAPSULOTOMY OF RIGHT EYE;  Surgeon: [name], MD;  Location: [facility];  Service: Ophthalmology;  Laterality: Right;  . Tonsillectomy    . Pacemaker insertion      DDD St. Jude PM, initially placed  in 1993 by Dr. Nils Pyle. Device upgrade 10/2002 to DDD, by Dr. Rollene Fare complicated by bleeding.  . Carotid endarterectomy Right 2006  . Transurethral resection of prostate    . Cardioversion N/A 06/16/2013    Procedure: CARDIOVERSION BEDSIDE;  Surgeon: [name], MD;  Location: [facility];  Service: Cardiovascular;  Laterality: N/A;  . Pacemaker generator change  12/09/2009    SJM Accent DR RF gen change by Dr [name]  . Insert / replace / remove pacemaker      2004   . Transurethral resection of prostate N/A 08/27/2014    Procedure: TRANSURETHRAL RESECTION OF THE PROSTATE WITH GYRUS INSTRUMENTS;  Surgeon: [name], MD;  Location: [facility];  Service: Urology;  Laterality: N/A;  . Cystoscopy N/A 08/27/2014    Procedure: CYSTOSCOPY;  Surgeon: [name], MD;  Location: [facility];  Service:  Urology;  Laterality: N/A;   Family History  Problem Relation Age of Onset  . Multiple myeloma Father   . Hypertension Mother   . Healthy Sister   . COPD Sister   . Heart failure Sister    Social History  Substance Use Topics  . Smoking status: Former Smoker -- 2 years    Types: Pipe, Cigarettes    Quit date: 05/14/1958  . Smokeless tobacco: Never Used  . Alcohol Use: No     Comment: Cocktails occasionally    Review of Systems  All other systems reviewed and are negative.     Allergies  Statins; Aggrenox; Carvedilol; Claritin; Cymbalta; Mucinex; Phenergan; Plavix; Rapaflo; and Travatan z  Home Medications   Prior to Admission medications   Medication Sig Start Date End Date Taking? Authorizing Provider  allopurinol (ZYLOPRIM) 100 MG tablet Take 100 mg by mouth 2 (two) times daily.     Historical Provider, MD  amiodarone (PACERONE) 200 MG tablet Take 1 tablet (200 mg total) by mouth at bedtime. 09/17/14   Belva Crome, MD  apixaban (ELIQUIS) 2.5 MG TABS tablet Take 1 tablet (2.5 mg total) by mouth 2 (two) times daily. 12/27/14   Belva Crome, MD  Carboxymethylcellul-Glycerin 0.5-0.9 % SOLN Place 1 drop into both eyes 3 (three) times daily as needed (dry eyes).    Historical Provider, MD  ferrous sulfate 325 (65 FE) MG tablet Take 325 mg by mouth every other day.    Historical Provider, MD  fexofenadine (ALLEGRA) 180 MG tablet Take 180 mg by mouth at bedtime.     Historical Provider, MD  insulin aspart (NOVOLOG FLEXPEN) 100 UNIT/ML FlexPen Inject 0-10 Units into the skin 3 (three) times daily with meals. Per sliding scale:  CBG <175 no insulin (unless consuming large amounts of carbs; 6-10 units for CBG >175 based on actual CBG and number of carbs in meal; >325 call MD    Historical Provider, MD  insulin glargine (LANTUS) 100 UNIT/ML injection Inject 10 Units into the skin at bedtime.    Historical Provider, MD  isosorbide mononitrate (IMDUR) 60 MG 24 hr tablet Take 1 tablet (60  mg total) by mouth daily. 07/14/14   Belva Crome, MD  metolazone (ZAROXOLYN) 2.5 MG tablet Take 2.5 mg by mouth daily as needed (Take 30 mins prior to [facility] if weight is 178 or over).    Historical Provider, MD  metoprolol tartrate (LOPRESSOR) 25 MG tablet Take 1 tablet (25 mg total) by mouth daily. 06/21/14   Belva Crome, MD  nitroGLYCERIN (NITROSTAT) 0.4 MG SL tablet Place 1 tablet (0.4 mg total)  under the tongue every 5 (five) minutes as needed for chest pain. 12/03/13   Belva Crome, MD  OVER THE COUNTER MEDICATION Take 1 packet by mouth daily. Nature Made Diabetic Pak (6 tablet combo)    Historical Provider, MD  polyethylene glycol (MIRALAX / GLYCOLAX) packet Take 17 g by mouth 2 (two) times daily.     Historical Provider, MD  potassium chloride SA (K-DUR,KLOR-CON) 20 MEQ tablet Take 1 tablet (20 mEq total) by mouth daily. 09/08/14   Debbe Odea, MD  Tafluprost 0.0015 % SOLN Place 1 drop into both eyes at bedtime. [place] Provider, MD  torsemide (DEMADEX) 20 MG tablet Take 3 tablets (60 mg total) by mouth daily. 01/03/15   Belva Crome, MD   BP 92/65 mmHg  Pulse 65  Temp(Src) 98.1 F (36.7 C) (Oral)  Resp 16  SpO2 100% Physical Exam  Constitutional: He appears well-developed and well-nourished. No distress.  HENT:  Head: Normocephalic and atraumatic.  Neck: Neck supple.  Cardiovascular: Normal rate and regular rhythm.   Pulmonary/Chest: Effort normal. No respiratory distress. He has wheezes. He has rales.  Bibasilar rales.  Slight wheeze anteriorly.   Abdominal: Soft. He exhibits no distension and no mass. There is no tenderness. There is no rebound and no guarding.  Musculoskeletal:  Bilateral posterior heels and inferior aspect of achilles with tenderness to palpation.  Left plantar heel with erythema, tenderness.  Bilateral feet with pitting edema that is equal bilaterally.  Erythema across both dorsal feet, medially.  Skin is smooth and shiny.  Light dorsalis pedis  pulse palpated on right, not palpable on left but present with dopplers.  Moves ankles and toes without difficulty.  Bilateral calves without tenderness, erythema, edema, or warmth.   Neurological: He is alert. He exhibits normal muscle tone.  Skin: He is not diaphoretic.  Nursing note and vitals reviewed.   ED Course  Procedures (including critical care time) Labs Review Labs Reviewed  BASIC METABOLIC PANEL - Abnormal; Notable for the following:    Chloride 97 (*)    Glucose, Bld 177 (*)    BUN 51 (*)    Creatinine, Ser 3.04 (*)    GFR calc non Af Amer 16 (*)    GFR calc Af Amer 19 (*)    All other components within normal limits  CBC WITH DIFFERENTIAL/PLATELET - Abnormal; Notable for the following:    RBC 3.58 (*)    Hemoglobin 12.0 (*)    HCT 35.7 (*)    All other components within normal limits  BRAIN NATRIURETIC PEPTIDE - Abnormal; Notable for the following:    B Natriuretic Peptide 325.4 (*)    All other components within normal limits  GLUCOSE, CAPILLARY - Abnormal; Notable for the following:    Glucose-Capillary 168 (*)    All other components within normal limits  URIC ACID  SEDIMENTATION RATE  C-REACTIVE PROTEIN    Imaging Review Dg Chest 2 View  02/27/2015  CLINICAL DATA:  81 year old male with cough and shortness of breath for the past 3 days. EXAM: CHEST  2 VIEW COMPARISON:  Chest x-ray 10/04/2014. FINDINGS: Lung volumes are normal. No acute consolidative airspace disease. Small bilateral pleural effusions. No evidence of pulmonary edema. No suspicious appearing pulmonary nodules or masses. Mild cardiomegaly. Upper mediastinal contours are within normal limits. Atherosclerosis in the thoracic aorta. Left-sided pacemaker device in place with lead tips projecting over the expected location of the right atrium and right ventricular apex. IMPRESSION:  1. Small bilateral pleural effusions. 2. Mild cardiomegaly. 3. Atherosclerosis. Electronically Signed   By: Vinnie Langton M.D.   On: 02/27/2015 09:19   I have personally reviewed and evaluated these images and lab results as part of my medical decision-making.   EKG Interpretation None       8:15 AM Discussed pt with Dr Stark Jock who will also see and examine the patient.    9:40 AM Dr Stark Jock has also seen and examined the patient.  Agrees with workup, no additional tests at present.    10:57 AM Discussed pt with PA Chester Holstein who accepts admission.  Attending is Fuller Plan, MD.    MDM   Final diagnoses:  Pain of lower extremity, unspecified laterality  Acute gout of multiple sites, unspecified cause    Afebrile nontoxic patient with hx CHF, afib on eliquis, DM, gout p/w bilateral foot/ankle pain without injury.  Neurovascularly intact.  Unclear cause of this pain but pt is unable to put pressure on his feet and care for himself adequately at home.  Extremely tender to palpation to light touch over posterior heels.  Possible gout flare vs pain related to peripheral edema.   Doubt cellulitis, DVT.  Labs at baseline.  Pt also seen and examined by Dr Stark Jock.  Please see his note for further details.  Admitted to [facility] for further evaluation and management.      Clayton Bibles, PA-C 02/27/15 [place], MD 02/27/15 1536

## 2015-02-28 ENCOUNTER — Observation Stay (HOSPITAL_COMMUNITY): Payer: Medicare Other

## 2015-02-28 ENCOUNTER — Other Ambulatory Visit: Payer: Self-pay | Admitting: Interventional Cardiology

## 2015-02-28 DIAGNOSIS — M79604 Pain in right leg: Secondary | ICD-10-CM | POA: Diagnosis not present

## 2015-02-28 DIAGNOSIS — M79605 Pain in left leg: Secondary | ICD-10-CM

## 2015-02-28 DIAGNOSIS — Z7901 Long term (current) use of anticoagulants: Secondary | ICD-10-CM | POA: Diagnosis not present

## 2015-02-28 DIAGNOSIS — E1142 Type 2 diabetes mellitus with diabetic polyneuropathy: Secondary | ICD-10-CM | POA: Diagnosis not present

## 2015-02-28 DIAGNOSIS — M722 Plantar fascial fibromatosis: Secondary | ICD-10-CM

## 2015-02-28 DIAGNOSIS — N184 Chronic kidney disease, stage 4 (severe): Secondary | ICD-10-CM | POA: Diagnosis not present

## 2015-02-28 DIAGNOSIS — I10 Essential (primary) hypertension: Secondary | ICD-10-CM

## 2015-02-28 DIAGNOSIS — I5032 Chronic diastolic (congestive) heart failure: Secondary | ICD-10-CM | POA: Diagnosis not present

## 2015-02-28 DIAGNOSIS — M79672 Pain in left foot: Secondary | ICD-10-CM | POA: Diagnosis not present

## 2015-02-28 LAB — BASIC METABOLIC PANEL
Anion gap: 10 (ref 5–15)
BUN: 48 mg/dL — AB (ref 6–20)
CHLORIDE: 95 mmol/L — AB (ref 101–111)
CO2: 29 mmol/L (ref 22–32)
CREATININE: 2.8 mg/dL — AB (ref 0.61–1.24)
Calcium: 9.3 mg/dL (ref 8.9–10.3)
GFR calc Af Amer: 21 mL/min — ABNORMAL LOW (ref >60)
GFR calc non Af Amer: 18 mL/min — ABNORMAL LOW (ref >60)
GLUCOSE: 240 mg/dL — AB (ref 65–99)
POTASSIUM: 4.5 mmol/L (ref 3.5–5.1)
SODIUM: 134 mmol/L — AB (ref 135–145)

## 2015-02-28 LAB — GLUCOSE, CAPILLARY
GLUCOSE-CAPILLARY: 226 mg/dL — AB (ref 65–99)
GLUCOSE-CAPILLARY: 250 mg/dL — AB (ref 65–99)
Glucose-Capillary: 213 mg/dL — ABNORMAL HIGH (ref 65–99)
Glucose-Capillary: 116 mg/dL — ABNORMAL HIGH (ref 65–99)

## 2015-02-28 LAB — HEMOGLOBIN A1C
Hgb A1c MFr Bld: 8.6 % — ABNORMAL HIGH (ref 4.8–5.6)
Mean Plasma Glucose: 200 mg/dL

## 2015-02-28 MED ORDER — INSULIN GLARGINE 100 UNIT/ML ~~LOC~~ SOLN
12.0000 [IU] | Freq: Every day | SUBCUTANEOUS | Status: DC
Start: 2015-02-28 — End: 2015-03-01
  Administered 2015-02-28: 12 [IU] via SUBCUTANEOUS
  Filled 2015-02-28 (×2): qty 0.12

## 2015-02-28 MED ORDER — GABAPENTIN 100 MG PO CAPS: 100.0000 mg | ORAL_CAPSULE | Freq: Three times a day (TID) | ORAL | Status: DC

## 2015-02-28 MED ORDER — BENZONATATE 100 MG PO CAPS
100.0000 mg | ORAL_CAPSULE | Freq: Three times a day (TID) | ORAL | Status: DC
  Administered 2015-02-28 – 2015-03-01 (×3): 100 mg via ORAL
  Filled 2015-02-28 (×3): qty 1

## 2015-02-28 MED ORDER — TORSEMIDE 20 MG PO TABS: 60.0000 mg | ORAL_TABLET | Freq: Every day | ORAL | Status: DC

## 2015-02-28 MED ORDER — CEFUROXIME AXETIL 500 MG PO TABS
500.0000 mg | ORAL_TABLET | Freq: Two times a day (BID) | ORAL | Status: DC
  Administered 2015-02-28 – 2015-03-01 (×2): 500 mg via ORAL
  Filled 2015-02-28 (×5): qty 1

## 2015-02-28 MED ORDER — GABAPENTIN 100 MG PO CAPS: 100.0000 mg | ORAL_CAPSULE | Freq: Two times a day (BID) | ORAL | Status: DC

## 2015-02-28 MED ORDER — GABAPENTIN 100 MG PO CAPS
100.0000 mg | ORAL_CAPSULE | Freq: Two times a day (BID) | ORAL | Status: DC
  Administered 2015-02-28: 100 mg via ORAL
  Filled 2015-02-28 (×3): qty 1

## 2015-02-28 MED ORDER — METOPROLOL TARTRATE 25 MG PO TABS: 25.0000 mg | ORAL_TABLET | Freq: Every day | ORAL | Status: AC

## 2015-02-28 MED ORDER — METOPROLOL TARTRATE 25 MG PO TABS: 25.0000 mg | ORAL_TABLET | Freq: Every day | ORAL | Status: DC

## 2015-02-28 MED ORDER — MENTHOL 3 MG MT LOZG: 1.0000 | LOZENGE | OROMUCOSAL | Status: DC | PRN

## 2015-02-28 MED ORDER — INSULIN ASPART 100 UNIT/ML ~~LOC~~ SOLN
5.0000 [IU] | Freq: Three times a day (TID) | SUBCUTANEOUS | Status: DC
  Administered 2015-02-28 – 2015-03-01 (×3): 5 [IU] via SUBCUTANEOUS

## 2015-02-28 NOTE — Consult Note (Signed)
Admission H&P    Chief Complaint: Painful feet.  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 Pain involves the heels and soles of his feet primarily. He has marked tenderness to pressure, including attempting to bear weight as well as with any manipulation of his feet. He has had discomfort in his feet in the past and has been given Cymbalta which caused marked sedation and was discontinued. He has also been treated with gabapentin and with Lyrica, neither of which was of benefit and both were discontinued. The discomfort that he is experiencing at this point is different and worse and pain associated with peripheral neuropathy. X-rays of his feet were unremarkable.  Past Medical History  Diagnosis Date  . Hyperlipidemia   . Spinal stenosis   . Anemia   . Carotid artery disease ([place])     a. s/p prior carotid endarterectomy.  [name] PVD (peripheral vascular disease) ([place])   . Hyponatremia     Chronic  . CVA (cerebral infarction)     a. When asked to clarify patient says he was told thumb numbness may be TIA. He does not recall formal stroke. CT head results are only available through [facility], not in [facility].  . CKD (chronic kidney disease) stage 4, GFR 15-29 ml/min (HCC)   . Long term (current) use of anticoagulants   . PAF (paroxysmal atrial fibrillation) ([place])     a. Asymptomatic. On Eliquis. CHADSVASC 7.  . Sick sinus syndrome ([place])     With DDD St. Jude PM, initially placed in 1993 by Dr. Nils Pyle. Device upgrade 10/2002 to DDD, by Dr. Rollene Fare complicated by bleeding. Subsequent gen change 2011.  [name] Hypertensive cardiovascular disease   . Chronic diastolic  congestive heart failure ([place])     ECHO 05/20/12 LVEF estimated by 2D at 55-60%  . Arthritis   . Gait disorder   . Diabetic peripheral neuropathy associated with type 2 diabetes mellitus ([place])   . Diabetes mellitus (HCC)     insulin dependent  . Elevated troponin I level 06/22/2014    a. 06/2014 felt due to demand ischemia.  . Chronic anticoagulation     For PAF, on Eliquis   . Urinary retention   . Pulmonary hypertension ([place])   . Foot drop, left 07/21/2014  . Asterixis 07/21/2014  . Hypertension   . Presence of permanent cardiac pacemaker   . Pneumonia     hx of pneumonia x 1     Past Surgical History  Procedure Laterality Date  . Lumbar laminectomy  1995  . Back surgery    . Total knee arthroplasty Left   . Quadriceps tendon repair    . Cataract extraction    . Carotid endarterectomy    . Yag laser application Right 5/72/6203    Procedure: YAG LASER CAPSULOTOMY OF RIGHT EYE;  Surgeon: [name], MD;  Location: [facility];  Service: Ophthalmology;  Laterality: Right;  . Tonsillectomy    . Pacemaker insertion      DDD St. Jude PM, initially placed in 1993 by Dr. Nils Pyle. Device upgrade 10/2002 to DDD, by Dr. Rollene Fare complicated by bleeding.  . Carotid endarterectomy Right 2006  . Transurethral resection of prostate    .  Cardioversion N/A 06/16/2013    Procedure: CARDIOVERSION BEDSIDE;  Surgeon: [name], MD;  Location: [facility];  Service: Cardiovascular;  Laterality: N/A;  . Pacemaker generator change  12/09/2009    SJM Accent DR RF gen change by Dr [name]  . Insert / replace / remove pacemaker      2004   . Transurethral resection of prostate N/A 08/27/2014    Procedure: TRANSURETHRAL RESECTION OF THE PROSTATE WITH GYRUS INSTRUMENTS;  Surgeon: [name], MD;  Location: [facility];  Service: Urology;  Laterality: N/A;  . Cystoscopy N/A 08/27/2014    Procedure: CYSTOSCOPY;  Surgeon: [name], MD;  Location: [facility];  Service: Urology;  Laterality: N/A;    Family History   Problem Relation Age of Onset  . Multiple myeloma Father   . Hypertension Mother   . Healthy Sister   . COPD Sister   . Heart failure Sister    Social History:  reports that he quit smoking about 56 years ago. His smoking use included Pipe and Cigarettes. He quit after 2 years of use. He has never used smokeless tobacco. He reports that he does not drink alcohol or use illicit drugs.  Allergies:  Allergies  Allergen Reactions  . Statins Other (See Comments)    rhabdomyolisis  . Aggrenox [Aspirin-Dipyridamole Er] Other (See Comments)    Dizziness  . Carvedilol Other (See Comments)    Dizziness  . Claritin [Loratadine] Other (See Comments)    Dizziness & drowsiness   . Cymbalta [Duloxetine Hcl]     Confusion   . Mucinex [Guaifenesin Er] Other (See Comments)    Dizziness, drowsiness  . Phenergan [Promethazine Hcl] Other (See Comments)    Causes severe confusion  . Plavix [Clopidogrel Bisulfate] Other (See Comments)    Dizziness  . Rapaflo [Silodosin] Other (See Comments)    Dizziness for about 30- 45  Minutes At time of preop appointment on 08/26/2014 patient denies any problems with this medication  . Travatan Z [Travoprost] Other (See Comments)    Eye irritation    Medications Prior to Admission  Medication Sig Dispense Refill  . allopurinol (ZYLOPRIM) 100 MG tablet Take 100 mg by mouth 2 (two) times daily.     [name] amiodarone (PACERONE) 200 MG tablet Take 1 tablet (200 mg total) by mouth at bedtime. 30 tablet 6  . apixaban (ELIQUIS) 2.5 MG TABS tablet Take 1 tablet (2.5 mg total) by mouth 2 (two) times daily. 60 tablet 11  . azithromycin (ZITHROMAX) 500 MG tablet Take 500 mg by mouth daily.    . Carboxymethylcellul-Glycerin 0.5-0.9 % SOLN Place 1 drop into both eyes 2 (two) times daily.     . ferrous sulfate 325 (65 FE) MG tablet Take 325 mg by mouth every other day.    . fexofenadine (ALLEGRA) 180 MG tablet Take 180 mg by mouth at bedtime.     . insulin aspart (NOVOLOG  FLEXPEN) 100 UNIT/ML FlexPen Inject 9 Units into the skin 3 (three) times daily with meals. Per sliding scale:  CBG <175 no insulin (unless consuming large amounts of carbs; 6-10 units for CBG >175 based on actual CBG and number of carbs in meal; >325 call MD    . insulin glargine (LANTUS) 100 UNIT/ML injection Inject 10 Units into the skin at bedtime.    . isosorbide mononitrate (IMDUR) 60 MG 24 hr tablet Take 1 tablet (60 mg total) by mouth daily. 30 tablet 5  . metolazone (ZAROXOLYN) 2.5 MG tablet Take  2.5 mg by mouth daily as needed (Take 30 mins prior to Demadex if weight is 178 or over).    . metoprolol tartrate (LOPRESSOR) 25 MG tablet Take 1 tablet (25 mg total) by mouth daily. 30 tablet 6  . nitroGLYCERIN (NITROSTAT) 0.4 MG SL tablet Place 1 tablet (0.4 mg total) under the tongue every 5 (five) minutes as needed for chest pain. 25 tablet 3  . OVER THE COUNTER MEDICATION Take 1 packet by mouth daily. Nature Made Diabetic Pak (6 tablet combo)    . polyethylene glycol (MIRALAX / GLYCOLAX) packet Take 17 g by mouth 2 (two) times daily.     . potassium chloride SA (K-DUR,KLOR-CON) 20 MEQ tablet Take 1 tablet (20 mEq total) by mouth daily. 30 tablet 6  . Tafluprost 0.0015 % SOLN Place 1 drop into both eyes at bedtime. ZIOPTAN    . torsemide (DEMADEX) 20 MG tablet Take 3 tablets (60 mg total) by mouth daily. (Patient taking differently: Take 80 mg by mouth daily. )      ROS: History obtained from chart review and the patient  General ROS: negative for - chills, fatigue, fever, night sweats, weight gain or weight loss Psychological ROS: negative for - behavioral disorder, hallucinations, memory difficulties, mood swings or suicidal ideation Ophthalmic ROS: negative for - blurry vision, double vision, eye pain or loss of vision ENT ROS: negative for - epistaxis, nasal discharge, oral lesions, sore throat, tinnitus or vertigo Allergy and Immunology ROS: negative for - hives or itchy/watery  eyes Hematological and Lymphatic ROS: negative for - bleeding problems, bruising or swollen lymph nodes Endocrine ROS: negative for - galactorrhea, hair pattern changes, polydipsia/polyuria or temperature intolerance Respiratory ROS: Patient developed a cough 1 week ago which was treated with erythromycin and Lasix Cardiovascular ROS: negative for - chest pain, dyspnea on exertion, edema or irregular heartbeat Gastrointestinal ROS: negative for - abdominal pain, diarrhea, hematemesis, nausea/vomiting or stool incontinence Genito-Urinary ROS: negative for - dysuria, hematuria, incontinence or urinary frequency/urgency Musculoskeletal ROS: negative for - joint swelling or muscular weakness Neurological ROS: as noted in HPI Dermatological ROS: negative for rash and skin lesion changes  Physical Examination: Blood pressure 140/58, pulse 67, temperature 98 F (36.7 C), temperature source Oral, resp. rate 18, SpO2 100 %.  HEENT-  Normocephalic, no lesions, without obvious abnormality.  Normal external eye and conjunctiva.  Normal TM's bilaterally.  Normal auditory canals and external ears. Normal external nose, mucus membranes and septum.  Normal pharynx. Neck supple with no masses, nodes, nodules or enlargement. Cardiovascular - regular rate and rhythm, S1, S2 normal, no murmur, click, rub or gallop Lungs - chest clear, no wheezing, rales, normal symmetric air entry Abdomen - soft, non-tender; bowel sounds normal; no masses,  no organomegaly Extremities - marked tenderness of both heels and over the soles of both feet, left greater than right, with minimal pressure as well as with movement at the ankle. Mild swelling of his feet was also noted.  Neurologic Examination: Mental Status: Alert, oriented, thought content appropriate.  Speech fluent without evidence of aphasia. Able to follow commands without difficulty. Cranial Nerves: II-Visual fields were normal. III/IV/VI-Pupils were equal and  reacted normally to light. Extraocular movements were full and conjugate.    V/VII-no facial numbness and no facial weakness. VIII-normal. X-normal speech and symmetrical palatal movement. XI: trapezius strength/neck flexion strength normal bilaterally XII-midline tongue extension with normal strength. Motor: Normal strength and muscle tone throughout; mild asterixis bilaterally, right greater than left. Sensory: Absent  vibratory sensation distally in both lower extremities. Deep Tendon Reflexes: 1+ at left knee and absent at right knee. Plantars: Flexor bilaterally Cerebellar: Normal finger-to-nose testing. Carotid auscultation: Normal  Results for orders placed or performed during the hospital encounter of 02/27/15 (from the past 48 hour(s))  Basic metabolic panel     Status: Abnormal   Collection Time: 02/27/15  9:24 AM  Result Value Ref Range   Sodium 136 135 - 145 mmol/L   Potassium 4.2 3.5 - 5.1 mmol/L   Chloride 97 (L) 101 - 111 mmol/L   CO2 29 22 - 32 mmol/L   Glucose, Bld 177 (H) 65 - 99 mg/dL   BUN 51 (H) 6 - 20 mg/dL   Creatinine, Ser 3.04 (H) 0.61 - 1.24 mg/dL   Calcium 9.7 8.9 - 10.3 mg/dL   GFR calc non Af Amer 16 (L) >60 mL/min   GFR calc Af Amer 19 (L) >60 mL/min    Comment: (NOTE) The eGFR has been calculated using the CKD EPI equation. This calculation has not been validated in all clinical situations. eGFR's persistently <60 mL/min signify possible Chronic Kidney Disease.    Anion gap 10 5 - 15  CBC with Differential     Status: Abnormal   Collection Time: 02/27/15  9:24 AM  Result Value Ref Range   WBC 7.7 4.0 - 10.5 K/uL   RBC 3.58 (L) 4.22 - 5.81 MIL/uL   Hemoglobin 12.0 (L) 13.0 - 17.0 g/dL   HCT 35.7 (L) 39.0 - 52.0 %   MCV 99.7 78.0 - 100.0 fL   MCH 33.5 26.0 - 34.0 pg   MCHC 33.6 30.0 - 36.0 g/dL   RDW 15.5 11.5 - 15.5 %   Platelets 163 150 - 400 K/uL   Neutrophils Relative % 66 %   Neutro Abs 5.1 1.7 - 7.7 K/uL   Lymphocytes Relative 26 %    Lymphs Abs 2.0 0.7 - 4.0 K/uL   Monocytes Relative 7 %   Monocytes Absolute 0.5 0.1 - 1.0 K/uL   Eosinophils Relative 1 %   Eosinophils Absolute 0.1 0.0 - 0.7 K/uL   Basophils Relative 0 %   Basophils Absolute 0.0 0.0 - 0.1 K/uL  Brain natriuretic peptide     Status: Abnormal   Collection Time: 02/27/15  9:24 AM  Result Value Ref Range   B Natriuretic Peptide 325.4 (H) 0.0 - 100.0 pg/mL  Uric acid     Status: None   Collection Time: 02/27/15  9:24 AM  Result Value Ref Range   Uric Acid, Serum 5.8 4.4 - 7.6 mg/dL  Glucose, capillary     Status: Abnormal   Collection Time: 02/27/15  1:16 PM  Result Value Ref Range   Glucose-Capillary 168 (H) 65 - 99 mg/dL  Hemoglobin A1c     Status: Abnormal   Collection Time: 02/27/15  4:30 PM  Result Value Ref Range   Hgb A1c MFr Bld 8.6 (H) 4.8 - 5.6 %    Comment: (NOTE)         Pre-diabetes: 5.7 - 6.4         Diabetes: >6.4         Glycemic control for adults with diabetes: <7.0    Mean Plasma Glucose 200 mg/dL    Comment: (NOTE) Performed At: [facility] [place], [place] [id number] Lindon Romp MD OX:[id number]   Glucose, capillary     Status: Abnormal   Collection Time: 02/27/15  4:52 PM  Result Value Ref Range   Glucose-Capillary 257 (H) 65 - 99 mg/dL   Comment 1 Notify RN   Glucose, capillary     Status: Abnormal   Collection Time: 02/27/15  9:00 PM  Result Value Ref Range   Glucose-Capillary 229 (H) 65 - 99 mg/dL  Basic metabolic panel     Status: Abnormal   Collection Time: 02/28/15  4:05 AM  Result Value Ref Range   Sodium 134 (L) 135 - 145 mmol/L   Potassium 4.5 3.5 - 5.1 mmol/L    Comment: SPECIMEN HEMOLYZED. HEMOLYSIS MAY AFFECT INTEGRITY OF RESULTS.   Chloride 95 (L) 101 - 111 mmol/L   CO2 29 22 - 32 mmol/L   Glucose, Bld 240 (H) 65 - 99 mg/dL   BUN 48 (H) 6 - 20 mg/dL   Creatinine, Ser 2.80 (H) 0.61 - 1.24 mg/dL   Calcium 9.3 8.9 - 10.3 mg/dL   GFR calc non Af Amer 18 (L) >60 mL/min    GFR calc Af Amer 21 (L) >60 mL/min    Comment: (NOTE) The eGFR has been calculated using the CKD EPI equation. This calculation has not been validated in all clinical situations. eGFR's persistently <60 mL/min signify possible Chronic Kidney Disease.    Anion gap 10 5 - 15  Glucose, capillary     Status: Abnormal   Collection Time: 02/28/15  6:14 AM  Result Value Ref Range   Glucose-Capillary 226 (H) 65 - 99 mg/dL  Glucose, capillary     Status: Abnormal   Collection Time: 02/28/15 12:05 PM  Result Value Ref Range   Glucose-Capillary 250 (H) 65 - 99 mg/dL   Comment 1 Repeat Test    Comment 2 Document in Chart    Dg Chest 2 View  02/27/2015  CLINICAL DATA:  81 year old male with cough and shortness of breath for the past 3 days. EXAM: CHEST  2 VIEW COMPARISON:  Chest x-ray 10/04/2014. FINDINGS: Lung volumes are normal. No acute consolidative airspace disease. Small bilateral pleural effusions. No evidence of pulmonary edema. No suspicious appearing pulmonary nodules or masses. Mild cardiomegaly. Upper mediastinal contours are within normal limits. Atherosclerosis in the thoracic aorta. Left-sided pacemaker device in place with lead tips projecting over the expected location of the right atrium and right ventricular apex. IMPRESSION: 1. Small bilateral pleural effusions. 2. Mild cardiomegaly. 3. Atherosclerosis. Electronically Signed   By: Vinnie Langton M.D.   On: 02/27/2015 09:19   Dg Lumbar Spine 2-3 Views  02/28/2015  CLINICAL DATA:  Bilateral leg pain and foot pain for a few days. Concern for compression fracture. EXAM: LUMBAR SPINE - 2-3 VIEW COMPARISON:  Lumbar spine CT 04/30/2013 FINDINGS: There are 5 non rib-bearing lumbar type vertebral bodies. Postsurgical changes are again seen with evidence of prior laminectomies from L2-L5. Solid intervertebral osseous fusion at L2-3 is unchanged. Vertebral alignment is unchanged with straightening of the normal lumbar lordosis and grade 1  anterolisthesis of L4 on L5. Mild disc space narrowing is again seen from L3-4 to L5-S1 with evidence of vacuum disc at L5-S1. Degenerative endplate sclerosis and osteophytosis are again seen in the mid and lower lumbar spine, with advanced facet arthrosis present throughout the lumbar spine. There is evidence of neural foraminal stenosis from L2-3 to L5-S1 due to disc and facet disease. Atherosclerotic aortoiliac calcification is noted. Pacemaker leads are partially visualized. Phleboliths are noted in the pelvis. IMPRESSION: Stable postsurgical and degenerative changes in the lumbar spine. No evidence of acute osseous abnormality. Electronically  Signed   By: Logan Bores M.D.   On: 02/28/2015 09:27   Dg Foot Complete Left  02/27/2015  CLINICAL DATA:  L foot pain, heel feels like pins and needles and poking him; manipulated tube and held foot for obliques, ankle was unable to move; best possible images due to pts condition EXAM: LEFT FOOT - COMPLETE 3+ VIEW COMPARISON:  02/08/2011 FINDINGS: Anatomic distortion, secondary to hammertoe deformities. This degrades evaluation of the forefoot. No osseous destruction. First metatarsophalangeal joint osteoarthritis. Small Achilles and calcaneal spurs. No radiopaque foreign object. Vascular calcifications. No soft tissue ulcer identified. IMPRESSION: Degenerative change, without acute osseous finding. Electronically Signed   By: Abigail Miyamoto M.D.   On: 02/27/2015 15:28   Dg Foot Complete Right  02/27/2015  CLINICAL DATA:  Right foot pain EXAM: RIGHT FOOT COMPLETE - 3+ VIEW COMPARISON:  None. FINDINGS: 4 views of the right foot submitted. There is plantar spur of calcaneus. No acute fracture or subluxation. IMPRESSION: No acute fracture or subluxation.  Plantar spurring of calcaneus. Electronically Signed   By: Lahoma Crocker M.D.   On: 02/27/2015 15:26    Assessment/Plan 81 year old man with multiple medical problems as described above, presenting with severe pain  and tenderness involving both feet. Etiology is unclear. Current symptoms are clearly different from discomfort associated with known peripheral neuropathy associated with diabetes. Current symptoms appear to be musculoskeletal in origin, and given the exquisite tenderness and discomfort, acute attack of gout cannot be ruled out. X-ray showed no acute bony lesions.  Recommend considering consultation with Rheumatology Service for further evaluation and management. No further neurological intervention is indicated acutely.  We will see Dr. Kennon Holter and follow-up on an as-needed basis following this visit.  C.R. Nicole Kindred, MD [facility] [phone]  02/28/2015, 12:21 PM

## 2015-02-28 NOTE — Progress Notes (Signed)
Inpatient Diabetes Program Recommendations  AACE/ADA: New Consensus Statement on Inpatient Glycemic Control (2015)  Target Ranges:  Prepandial:   less than 140 mg/dL      Peak postprandial:   less than 180 mg/dL (1-2 hours)      Critically ill patients:  140 - 180 mg/dL  Results for Darrell Mulder, MD (MRN [id number]) as of 02/28/2015 08:28  Ref. Range 02/27/2015 13:16 02/27/2015 16:52 02/27/2015 21:00 02/28/2015 06:14  Glucose-Capillary Latest Ref Range: 65-99 mg/dL 168 (H) 257 (H) 229 (H) 226 (H)   Review of Glycemic Control  Diabetes history: DM2 Outpatient Diabetes medications: Lantus 10 units QHS, Novolog 9 units TID with meals plus SSI Current orders for Inpatient glycemic control: Lantus 10 units QHS, Novolog 0-9 units TID with meals, Novolog 3 units TID with meals for meal coverage  Inpatient Diabetes Program Recommendations: Insulin - Basal: Fasting glucose 226 m/gdl this morning. Please consider increasing Lantus to 12 units QHS. Correction (SSI): Please consider adding Novolog bedtime correction scale. Insulin - Meal Coverage: Post prandial glucose consistently elevated. Please consider increasing meal coverage to Novolog 5 units TID with meals.  Thanks, Barnie Alderman, RN, MSN, CCRN, CDE Diabetes Coordinator Inpatient Diabetes Program [phone] (Team Pager from [place] to [place]) [phone] ([facility] office) [phone] [facility] office) [phone] [facility] office)

## 2015-02-28 NOTE — Evaluation (Addendum)
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 Lumbar MRI (-)  Clinical Impression  Dr. Kennon Holter if familiar from prior admissions with caregiver [name] present in room. Pt very pleasant and currently limited by bil foot pain unable to don his orthotic shoes or AFO due to pain but was able to tolerate standing and limited ambulation. Pt with direct pain at heel and on achilles tendon as is represented with plantar fasciitis. Pt educated for stretching foot intrinsics and achilles tendon as well as positioning and lack of heavy covers or weights pulling into plantarflexion at rest. Pt with good family and social support who were able to demonstrate understanding of education. Pt will benefit from acute therapy to maximize gait, function and ROM to decrease burden of care and return pt to PLOF.     Follow Up Recommendations Outpatient PT    Equipment Recommendations  None recommended by PT    Recommendations for Other Services       Precautions / Restrictions Precautions Precautions: Fall Precaution Comments: pt wears a spinal corset and Left AFO at baseline      Mobility  Bed Mobility Overal bed mobility: Needs Assistance Bed Mobility: Supine to Sit     Supine to sit: Min assist;HOB elevated     General bed mobility comments: cues for sequence with HOB 20 degrees, use of rail and assist to fully elevate trunk from surface  Transfers Overall transfer level: Needs assistance   Transfers: Sit to/from Stand Sit to Stand: Min guard         General transfer comment: guarding for  safety  Ambulation/Gait Ambulation/Gait assistance: Min guard Ambulation Distance (Feet): 15 Feet Assistive device: Rolling walker (2 wheeled) Gait Pattern/deviations: Step-through pattern;Decreased stride length;Trunk flexed   Gait velocity interpretation: Below normal speed for age/gender General Gait Details: cues for posture and position in RW  Stairs            Wheelchair Mobility    Modified Rankin (Stroke Patients Only)       Balance Overall balance assessment: Needs assistance   Sitting balance-Leahy Scale: Good       Standing balance-Leahy Scale: Fair                               Pertinent Vitals/Pain Pain Assessment: 0-10 Pain Score: 6  Pain Location: bil pain at heel and achilles tendon Pain Descriptors / Indicators: Tender Pain Intervention(s): Limited activity within patient's tolerance;Monitored during session;Repositioned    Home Living Family/patient expects to be discharged to:: Private residence Living Arrangements: Non-relatives/Friends;Children Available Help at Discharge: Personal care attendant;Available 24 hours/day;Family Type of Home: House Home Access: Stairs to enter Entrance Stairs-Rails: Right;Left;Can reach both Entrance Stairs-Number of Steps: 3 Home Layout: One level Home Equipment: Walker - 4 wheels;Tub bench;Hand held shower head;Walker - 2 wheels;Adaptive equipment      Prior Function Level of Independence: Needs assistance   Gait / Transfers Assistance Needed: Ambulated with rollator and would sit prn when legs got tired.  Walked short distances with the caregiver and son  ADL's / Homemaking Assistance Needed: assisted for  tub bench transfer, to wash feet and back, for LB dressing, meal prep and housekeeping. Pt routinely sits to perform grooming on his rollator.  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, newspaper        Extremity/Trunk Assessment   Upper Extremity Assessment:  Generalized weakness           Lower Extremity Assessment: Generalized weakness      Cervical / Trunk Assessment: Kyphotic  Communication   Communication: HOH  Cognition Arousal/Alertness: Awake/alert Behavior During Therapy: WFL for tasks assessed/performed Overall Cognitive Status: Within Functional Limits for tasks assessed                      General Comments      Exercises General Exercises - Lower Extremity Toe Raises: AROM;AAROM;Seated;Both;5 reps Other Exercises Other Exercises: bil achilles tendon stretch x 3 with education for AAROM of LLE with use of towel or sheet      Assessment/Plan    PT Assessment Patient needs continued PT services  PT Diagnosis Difficulty walking;Acute pain   PT Problem List Decreased activity tolerance;Decreased balance;Pain;Decreased mobility  PT Treatment Interventions Gait training;DME instruction;Functional mobility training;Therapeutic exercise;Patient/family education   PT Goals (Current goals can be found in the Care Plan section) Acute Rehab PT Goals Patient Stated Goal: return home PT Goal Formulation: With patient Time For Goal Achievement: 03/14/15 Potential to Achieve Goals: Good    Frequency Min 3X/week   Barriers to discharge        Co-evaluation               End of Session Equipment Utilized During Treatment: Back brace Activity Tolerance: Patient tolerated treatment well Patient left: in chair;with call bell/phone within reach;with family/visitor present Nurse Communication: Mobility status;Precautions    Functional Assessment Tool Used: clinical judgement Functional Limitation: Mobility: Walking and moving around Mobility: Walking and Moving Around Current Status [phone]): At least 1 percent but less than 20 percent impaired, limited or restricted Mobility: Walking and Moving Around Goal Status [phone]): At least 1 percent but less than 20 percent impaired, limited or restricted    Time:  4431-5400 PT Time Calculation (min) (ACUTE ONLY): 25 min   Charges:   PT Evaluation $Initial PT Evaluation Tier I: 1 Procedure PT Treatments $Therapeutic Activity: 8-22 mins   PT G Codes:   PT G-Codes **NOT FOR INPATIENT CLASS** Functional Assessment Tool Used: clinical judgement Functional Limitation: Mobility: Walking and moving around Mobility: Walking and Moving Around Current Status ([id number]): At least 1 percent but less than 20 percent impaired, limited or restricted Mobility: Walking and Moving Around Goal Status [phone]): At least 1 percent but less than 20 percent impaired, limited or restricted    Melford Aase 02/28/2015, 2:11 PM Elwyn Reach, [place]

## 2015-02-28 NOTE — Progress Notes (Addendum)
[facility]                                                                              Patient Demographics  [name], is a 81 y.o. male, DOB - 06-05-1918, TKW:[id number]  Admit date - 02/27/2015   Admitting Physician Norval Morton, MD  Outpatient Primary MD for the patient is [name], MD  LOS -    Chief Complaint  Patient presents with  . Leg Pain  . Foot Pain       Brief HPI  Per 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 Patient was recently hospitalized in [place] with cough and decompensated heart failure. Patient was given Erythromycin for a respiratory infection and cough resolving. Patient returned home last week. Within a day of returning home patient developed bilateral foot pain. He has a history of gout (right big toe) but has never had bilateral foot pain. It hurts to bear weight, even touch feet / heels. He was using a scooter while in [place]. Top of right foot is bruised, family unsure how this happened. No new meds other than Erythromycin. Patient did receive Flu Shot but cannot remember exactly when. tNo other complaints such as skin rash, short of breath or fevers.   Patient has a history of peripheral neuropathy. He tried Cymbalta but it made him too tired.    Assessment & Plan   Bilateral foot pain and hyperesthesia. Possibility of neuropathic pain, known history of diabetic neuropathy versus plantar fasciitis.  - Foot x-rays showed no stress fractures, lumbar spine x-ray also negative for any compression fractures.  - Uric acid within normal limits, rules out acute gout attack. Uric acid 5.4.  - Has underlying peripheral neuropathy but Cymbalta caused him to be excessively drowsy. Primary neurologist, Dr. Jannifer Franklin. His renal function will only allow for a low dose of gabapentin.  Requested neurology consult, discussed with Dr. Nicole Kindred -PTOT evaluation  Addendum: 3:52pm Per PT evaluation, patient had pain at the heel and on achilles tendon upon ambulating, more consistent with plantar fasciitis. I discussed in detail with orthopedics, Dr. Lyla Glassing ([place]) who recommended NSAIDs, Neurontin/Lyrica and maximum benefit only with physical therapy. [name] can follow up with their foot specialist, Dr Doran Durand if not improving with PT, may benefit from glucocorticoid in plantar fascia.  - D/w [name] about the above recommendations, will start on Neurontin today. PT evaluation again tomorrow and hopefully DC home in a.m   CKD (chronic kidney disease) stage 4, GFR 15-29 ml/min. Baseline creatine around 2.7-3.0, at 3.04 on admission - Creatinine now back to baseline, continue torsemide  Type 2 diabetes mellitus with peripheral neuropathy; uncontrolled - Increase Lantus to 20 units at bedtime, increase meal coverage to 5 units TID, and sliding scale insulin, - diabetic coordinator following. - Check Hgb A1c   Paroxysmal atrial fibrillation.  - Currently rate controlled, sinus rhythm  - Continue amiodarone, and Eliquis.  CAD / chronic diastolic heart failure.  - Chest x-ray showed  small bilateral pleural effusions, probably chronic, currently euvolemic  - Continue torsemide, takes Zaroxolyn as needed (sliding scale diuretic regimen).  - Strict I's and O's and daily weights  Essential hypertension Continue home anti-hypertensives.   Acute bronchitis - Per patient he was admitted at [facility] earlier this month and was on antibiotics, still having significant coughing with thick sputum - Chest x-ray negative for any pneumonia, placed on Ceftin, Tessalon, Cepacol, obtain sputum cultures     Code Status: Full CODE STATUS  Family Communication: Discussed in detail with the patient, all imaging results, lab results explained to the patient     Disposition Plan: DC home in a.m. if improving  Time Spent in minutes  25 minutes  Procedures  None  Consults   Neurology  DVT Prophylaxis apixaban  Medications  Scheduled Meds: . allopurinol  100 mg Oral BID  . amiodarone  200 mg Oral QHS  . apixaban  2.5 mg Oral BID  . benzonatate  100 mg Oral TID  . cefUROXime  500 mg Oral BID WC  . insulin aspart  0-9 Units Subcutaneous TID WC  . insulin aspart  5 Units Subcutaneous TID WC  . insulin glargine  12 Units Subcutaneous QHS  . isosorbide mononitrate  60 mg Oral Daily  . latanoprost  1 drop Both Eyes QHS  . metoprolol tartrate  25 mg Oral Daily  . polyethylene glycol  17 g Oral BID  . polyvinyl alcohol  1 drop Both Eyes BID  . potassium chloride SA  20 mEq Oral Daily  . torsemide  60 mg Oral Daily   Continuous Infusions:  PRN Meds:.acetaminophen **OR** acetaminophen, HYDROcodone-acetaminophen, menthol-cetylpyridinium, nitroGLYCERIN, ondansetron **OR** ondansetron (ZOFRAN) IV   Antibiotics   Anti-infectives    Start     Dose/Rate Route Frequency Ordered Stop   02/28/15 1115  cefUROXime (CEFTIN) tablet 500 mg     500 mg Oral 2 times daily with meals 02/28/15 1107          Subjective:   [name] was seen and examined today.  Complaining of bilateral heel pain, burning sensation with pins and needles. Worse on ambulation. Patient denies dizziness, chest pain, shortness of breath, abdominal pain, N/V/D/C, new weakness . No acute events overnight.    Objective:   Blood pressure 140/58, pulse 67, temperature 98 F (36.7 C), temperature source Oral, resp. rate 18, SpO2 100 %.  Wt Readings from Last 3 Encounters:  02/10/15 80.65 kg (177 lb 12.8 oz)  12/29/14 80.65 kg (177 lb 12.8 oz)  12/08/14 80.922 kg (178 lb 6.4 oz)     Intake/Output Summary (Last 24 hours) at 02/28/15 1239 Last data filed at 02/27/15 1813  Gross per 24 hour  Intake    360 ml  Output      0 ml  Net    360 ml    Exam  General:  Alert and oriented x 3, NAD  HEENT:  PERRLA, EOMI, Anicteric Sclera, mucous membranes moist.   Neck: Supple, no JVD, no masses  CVS: S1 S2 auscultated, no rubs, murmurs or gallops. Regular rate and rhythm.  Respiratory decreased breath sounds at the bases   Abdomen: Soft, nontender, nondistended, + bowel sounds  Ext: no cyanosis clubbing or edema  Neuro: AAOx3, Cr N's II- XII. Strength 5/5 upper and lower extremities bilaterally  Skin: No rashes  Psych: Normal affect and demeanor, alert and oriented x3    Data Review   Micro Results No results found for  this or any previous visit (from the past 240 hour(s)).  Radiology Reports Dg Chest 2 View  02/27/2015  CLINICAL DATA:  81 year old male with cough and shortness of breath for the past 3 days. EXAM: CHEST  2 VIEW COMPARISON:  Chest x-ray 10/04/2014. FINDINGS: Lung volumes are normal. No acute consolidative airspace disease. Small bilateral pleural effusions. No evidence of pulmonary edema. No suspicious appearing pulmonary nodules or masses. Mild cardiomegaly. Upper mediastinal contours are within normal limits. Atherosclerosis in the thoracic aorta. Left-sided pacemaker device in place with lead tips projecting over the expected location of the right atrium and right ventricular apex. IMPRESSION: 1. Small bilateral pleural effusions. 2. Mild cardiomegaly. 3. Atherosclerosis. Electronically Signed   By: Vinnie Langton M.D.   On: 02/27/2015 09:19   Dg Lumbar Spine 2-3 Views  02/28/2015  CLINICAL DATA:  Bilateral leg pain and foot pain for a few days. Concern for compression fracture. EXAM: LUMBAR SPINE - 2-3 VIEW COMPARISON:  Lumbar spine CT 04/30/2013 FINDINGS: There are 5 non rib-bearing lumbar type vertebral bodies. Postsurgical changes are again seen with evidence of prior laminectomies from L2-L5. Solid intervertebral osseous fusion at L2-3 is unchanged. Vertebral alignment is unchanged with straightening of the normal lumbar  lordosis and grade 1 anterolisthesis of L4 on L5. Mild disc space narrowing is again seen from L3-4 to L5-S1 with evidence of vacuum disc at L5-S1. Degenerative endplate sclerosis and osteophytosis are again seen in the mid and lower lumbar spine, with advanced facet arthrosis present throughout the lumbar spine. There is evidence of neural foraminal stenosis from L2-3 to L5-S1 due to disc and facet disease. Atherosclerotic aortoiliac calcification is noted. Pacemaker leads are partially visualized. Phleboliths are noted in the pelvis. IMPRESSION: Stable postsurgical and degenerative changes in the lumbar spine. No evidence of acute osseous abnormality. Electronically Signed   By: Logan Bores M.D.   On: 02/28/2015 09:27   Dg Foot Complete Left  02/27/2015  CLINICAL DATA:  L foot pain, heel feels like pins and needles and poking him; manipulated tube and held foot for obliques, ankle was unable to move; best possible images due to pts condition EXAM: LEFT FOOT - COMPLETE 3+ VIEW COMPARISON:  02/08/2011 FINDINGS: Anatomic distortion, secondary to hammertoe deformities. This degrades evaluation of the forefoot. No osseous destruction. First metatarsophalangeal joint osteoarthritis. Small Achilles and calcaneal spurs. No radiopaque foreign object. Vascular calcifications. No soft tissue ulcer identified. IMPRESSION: Degenerative change, without acute osseous finding. Electronically Signed   By: Abigail Miyamoto M.D.   On: 02/27/2015 15:28   Dg Foot Complete Right  02/27/2015  CLINICAL DATA:  Right foot pain EXAM: RIGHT FOOT COMPLETE - 3+ VIEW COMPARISON:  None. FINDINGS: 4 views of the right foot submitted. There is plantar spur of calcaneus. No acute fracture or subluxation. IMPRESSION: No acute fracture or subluxation.  Plantar spurring of calcaneus. Electronically Signed   By: Lahoma Crocker M.D.   On: 02/27/2015 15:26    CBC  Recent Labs Lab 02/27/15 0924  WBC 7.7  HGB 12.0*  HCT 35.7*  PLT 163  MCV  99.7  MCH 33.5  MCHC 33.6  RDW 15.5  LYMPHSABS 2.0  MONOABS 0.5  EOSABS 0.1  BASOSABS 0.0    Chemistries   Recent Labs Lab 02/27/15 0924 02/28/15 0405  NA 136 134*  K 4.2 4.5  CL 97* 95*  CO2 29 29  GLUCOSE 177* 240*  BUN 51* 48*  CREATININE 3.04* 2.80*  CALCIUM 9.7 9.3   ------------------------------------------------------------------------------------------------------------------  CrCl cannot be calculated (Unknown ideal weight.). ------------------------------------------------------------------------------------------------------------------  Recent Labs  02/27/15 1630  HGBA1C 8.6*   ------------------------------------------------------------------------------------------------------------------ No results for input(s): CHOL, HDL, LDLCALC, TRIG, CHOLHDL, LDLDIRECT in the last 72 hours. ------------------------------------------------------------------------------------------------------------------ No results for input(s): TSH, T4TOTAL, T3FREE, THYROIDAB in the last 72 hours.  Invalid input(s): FREET3 ------------------------------------------------------------------------------------------------------------------ No results for input(s): VITAMINB12, FOLATE, FERRITIN, TIBC, IRON, RETICCTPCT in the last 72 hours.  Coagulation profile No results for input(s): INR, PROTIME in the last 168 hours.  No results for input(s): DDIMER in the last 72 hours.  Cardiac Enzymes No results for input(s): CKMB, TROPONINI, MYOGLOBIN in the last 168 hours.  Invalid input(s): CK ------------------------------------------------------------------------------------------------------------------ Invalid input(s): Iron  02/27/15 1316 02/27/15 1652 02/27/15 2100 02/28/15 0614 02/28/15 1205  GLUCAP 168* 257* 229* 226* 250*     Kimarion Chery M.D. [facility] 02/28/2015, 12:39 PM  Pager: [phone] Between 7am to 7pm - call Pager -  [phone]  After 7pm go to [email or web address] - password [email or web address]  Call night coverage person covering after 7pm

## 2015-03-01 DIAGNOSIS — I5032 Chronic diastolic (congestive) heart failure: Secondary | ICD-10-CM | POA: Diagnosis not present

## 2015-03-01 DIAGNOSIS — M722 Plantar fascial fibromatosis: Secondary | ICD-10-CM | POA: Diagnosis present

## 2015-03-01 DIAGNOSIS — N184 Chronic kidney disease, stage 4 (severe): Secondary | ICD-10-CM | POA: Diagnosis not present

## 2015-03-01 DIAGNOSIS — Z7901 Long term (current) use of anticoagulants: Secondary | ICD-10-CM | POA: Diagnosis not present

## 2015-03-01 DIAGNOSIS — I10 Essential (primary) hypertension: Secondary | ICD-10-CM | POA: Diagnosis not present

## 2015-03-01 DIAGNOSIS — M79672 Pain in left foot: Secondary | ICD-10-CM | POA: Diagnosis not present

## 2015-03-01 LAB — BASIC METABOLIC PANEL
ANION GAP: 10 (ref 5–15)
BUN: 43 mg/dL — ABNORMAL HIGH (ref 6–20)
CALCIUM: 9.4 mg/dL (ref 8.9–10.3)
CO2: 28 mmol/L (ref 22–32)
Chloride: 98 mmol/L — ABNORMAL LOW (ref 101–111)
Creatinine, Ser: 2.82 mg/dL — ABNORMAL HIGH (ref 0.61–1.24)
GFR, EST AFRICAN AMERICAN: 21 mL/min — AB (ref >60)
GFR, EST NON AFRICAN AMERICAN: 18 mL/min — AB (ref >60)
Glucose, Bld: 165 mg/dL — ABNORMAL HIGH (ref 65–99)
POTASSIUM: 3.8 mmol/L (ref 3.5–5.1)
SODIUM: 136 mmol/L (ref 135–145)

## 2015-03-01 LAB — CBC
HCT: 34.6 % — ABNORMAL LOW (ref 39.0–52.0)
HEMOGLOBIN: 11.5 g/dL — AB (ref 13.0–17.0)
MCH: 33.6 pg (ref 26.0–34.0)
MCHC: 33.2 g/dL (ref 30.0–36.0)
MCV: 101.2 fL — ABNORMAL HIGH (ref 78.0–100.0)
PLATELETS: 178 10*3/uL (ref 150–400)
RBC: 3.42 MIL/uL — AB (ref 4.22–5.81)
RDW: 15.8 % — ABNORMAL HIGH (ref 11.5–15.5)
WBC: 8.6 10*3/uL (ref 4.0–10.5)

## 2015-03-01 LAB — GLUCOSE, CAPILLARY
GLUCOSE-CAPILLARY: 197 mg/dL — AB (ref 65–99)
Glucose-Capillary: 128 mg/dL — ABNORMAL HIGH (ref 65–99)

## 2015-03-01 LAB — EXPECTORATED SPUTUM ASSESSMENT W GRAM STAIN, RFLX TO RESP C: Special Requests: NORMAL

## 2015-03-01 MED ORDER — CEFUROXIME AXETIL 500 MG PO TABS: 500.0000 mg | ORAL_TABLET | Freq: Two times a day (BID) | ORAL | Status: DC

## 2015-03-01 MED ORDER — BENZONATATE 100 MG PO CAPS: 100.0000 mg | ORAL_CAPSULE | Freq: Three times a day (TID) | ORAL | Status: DC | PRN

## 2015-03-01 MED ORDER — GABAPENTIN 100 MG PO CAPS: 100.0000 mg | ORAL_CAPSULE | Freq: Every day | ORAL | Status: DC

## 2015-03-01 MED ORDER — PREGABALIN 50 MG PO CAPS: 50.0000 mg | ORAL_CAPSULE | Freq: Every evening | ORAL | Status: DC | PRN

## 2015-03-01 MED ORDER — TRAMADOL HCL 50 MG PO TABS: 50.0000 mg | ORAL_TABLET | Freq: Two times a day (BID) | ORAL | Status: DC | PRN

## 2015-03-01 MED ORDER — GABAPENTIN 100 MG PO CAPS: 100.0000 mg | ORAL_CAPSULE | Freq: Every evening | ORAL | Status: DC | PRN

## 2015-03-01 MED ORDER — CEFUROXIME AXETIL 500 MG PO TABS: 500.0000 mg | ORAL_TABLET | Freq: Every day | ORAL | Status: DC

## 2015-03-01 NOTE — Progress Notes (Signed)
Orthopedic Tech Progress Note Patient Details:  CLAIR ALFIERI, MD [birth year] [id number]  Ortho Devices Type of Ortho Device: CAM walker Ortho Device/Splint Location: lle Ortho Device/Splint Interventions: Application   Tanvir Hipple 03/01/2015, 9:32 AM

## 2015-03-01 NOTE — Consult Note (Signed)
Patient ID: Darrell Mulder, MD MRN: [id number] DOB/AGE: 12/18/1918 81 y.o.  Admit date: 02/27/2015  Admission Diagnoses:  Principal Problem:   Bilateral leg and foot pain Active Problems:   Type 2 diabetes mellitus with peripheral neuropathy (HCC)   Paroxysmal atrial fibrillation, DCCV 06/16/13   Chronic anticoagulation   Pacemaker - St Jude   CKD (chronic kidney disease) stage 4, GFR 15-29 ml/min (HCC)   Essential hypertension   Chronic diastolic heart failure (HCC)   Foot pain, bilateral   HPI: [name] is a 81yo BM that is being seen at the request of hospitalist for left greater than right heel pain.  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  Pain ongoing for a couple of weeks.  No injury.  Hx of left drop foot and type 2 DM with peripheral neuropathy.  Heel pain more posterior and plantar aspect left side worse.  Aggravated with ambulation.  Scheduled to be discharged today. No associated fever, chills.    Past Medical History: Past Medical History  Diagnosis Date  . Hyperlipidemia   . Spinal stenosis   . Anemia   . Carotid artery disease ([place])     a. s/p prior carotid endarterectomy.  [name] PVD (peripheral vascular disease) ([place])   . Hyponatremia     Chronic  . CVA (cerebral infarction)     a. When asked to clarify patient says he was told thumb numbness may be TIA. He does not recall formal stroke. CT head results are only available through [facility], not in [facility].  . CKD (chronic kidney disease) stage 4, GFR 15-29 ml/min (HCC)   . Long term (current) use of anticoagulants   . PAF (paroxysmal atrial fibrillation) ([place])     a. Asymptomatic. On Eliquis. CHADSVASC 7.  . Sick sinus syndrome ([place])     With DDD St. Jude PM, initially placed in 1993 by Dr. Nils Pyle. Device upgrade 10/2002 to DDD, by Dr. Rollene Fare complicated by bleeding. Subsequent gen change 2011.  [name] Hypertensive cardiovascular disease   . Chronic diastolic congestive heart failure  ([place])     ECHO 05/20/12 LVEF estimated by 2D at 55-60%  . Arthritis   . Gait disorder   . Diabetic peripheral neuropathy associated with type 2 diabetes mellitus ([place])   . Diabetes mellitus (HCC)     insulin dependent  . Elevated troponin I level 06/22/2014    a. 06/2014 felt due to demand ischemia.  . Chronic anticoagulation     For PAF, on Eliquis   . Urinary retention   . Pulmonary hypertension ([place])   . Foot drop, left 07/21/2014  . Asterixis 07/21/2014  . Hypertension   . Presence of permanent cardiac pacemaker   . Pneumonia     hx of pneumonia x 1     Surgical History: Past Surgical History  Procedure Laterality Date  . Lumbar laminectomy  1995  . Back surgery    . Total knee arthroplasty Left   . Quadriceps tendon repair    . Cataract extraction    . Carotid endarterectomy    . Yag laser application Right 02/04/2682    Procedure: YAG LASER CAPSULOTOMY OF RIGHT EYE;  Surgeon: [name], MD;  Location: [facility];  Service: Ophthalmology;  Laterality: Right;  . Tonsillectomy    . Pacemaker insertion      DDD St. Jude PM, initially placed in 1993 by Dr. Nils Pyle. Device upgrade 10/2002 to DDD, by Dr.  Weintraub complicated by bleeding.  . Carotid endarterectomy Right 2006  . Transurethral resection of prostate    . Cardioversion N/A 06/16/2013    Procedure: CARDIOVERSION BEDSIDE;  Surgeon: [name], MD;  Location: [facility];  Service: Cardiovascular;  Laterality: N/A;  . Pacemaker generator change  12/09/2009    SJM Accent DR RF gen change by Dr [name]  . Insert / replace / remove pacemaker      2004   . Transurethral resection of prostate N/A 08/27/2014    Procedure: TRANSURETHRAL RESECTION OF THE PROSTATE WITH GYRUS INSTRUMENTS;  Surgeon: [name], MD;  Location: [facility];  Service: Urology;  Laterality: N/A;  . Cystoscopy N/A 08/27/2014    Procedure: CYSTOSCOPY;  Surgeon: [name], MD;  Location: [facility];  Service: Urology;  Laterality: N/A;    Family  History: Family History  Problem Relation Age of Onset  . Multiple myeloma Father   . Hypertension Mother   . Healthy Sister   . COPD Sister   . Heart failure Sister     Social History: Social History   Social History  . Marital Status: Widowed    Spouse Name: N/A  . Number of Children: 7  . Years of Education: MD   Occupational History  . MD    Social History Main Topics  . Smoking status: Former Smoker -- 2 years    Types: Pipe, Cigarettes    Quit date: 05/14/1958  . Smokeless tobacco: Never Used  . Alcohol Use: No     Comment: Cocktails occasionally  . Drug Use: No  . Sexual Activity: Not Currently   Other Topics Concern  . Not on file   Social History Narrative   Has a caregiver that lives with him. Still works regularly as a Immunologist       Allergies: Statins; Aggrenox; Carvedilol; Claritin; Cymbalta; Mucinex; Phenergan; Plavix; Rapaflo; and Travatan z  Medications: I have reviewed the patient's current medications.  Vital Signs: Patient Vitals for the past 24 hrs:  BP Temp Temp src Pulse Resp SpO2 Weight  03/01/15 0756 - - - - - - 83.5 kg (184 lb 1.4 oz)  03/01/15 0700 128/64 mmHg 98.1 F (36.7 C) Oral 62 17 100 % -  02/28/15 2230 130/68 mmHg 98.3 F (36.8 C) Oral 62 17 100 % -  02/28/15 1400 133/64 mmHg 98.3 F (36.8 C) - 64 16 100 % -    Radiology: Dg Chest 2 View  02/27/2015  CLINICAL DATA:  81 year old male with cough and shortness of breath for the past 3 days. EXAM: CHEST  2 VIEW COMPARISON:  Chest x-ray 10/04/2014. FINDINGS: Lung volumes are normal. No acute consolidative airspace disease. Small bilateral pleural effusions. No evidence of pulmonary edema. No suspicious appearing pulmonary nodules or masses. Mild cardiomegaly. Upper mediastinal contours are within normal limits. Atherosclerosis in the thoracic aorta. Left-sided pacemaker device in place with lead tips projecting over the expected location of the right atrium and  right ventricular apex. IMPRESSION: 1. Small bilateral pleural effusions. 2. Mild cardiomegaly. 3. Atherosclerosis. Electronically Signed   By: Vinnie Langton M.D.   On: 02/27/2015 09:19   Dg Lumbar Spine 2-3 Views  02/28/2015  CLINICAL DATA:  Bilateral leg pain and foot pain for a few days. Concern for compression fracture. EXAM: LUMBAR SPINE - 2-3 VIEW COMPARISON:  Lumbar spine CT 04/30/2013 FINDINGS: There are 5 non rib-bearing lumbar type vertebral bodies. Postsurgical changes are again seen with evidence of prior  laminectomies from L2-L5. Solid intervertebral osseous fusion at L2-3 is unchanged. Vertebral alignment is unchanged with straightening of the normal lumbar lordosis and grade 1 anterolisthesis of L4 on L5. Mild disc space narrowing is again seen from L3-4 to L5-S1 with evidence of vacuum disc at L5-S1. Degenerative endplate sclerosis and osteophytosis are again seen in the mid and lower lumbar spine, with advanced facet arthrosis present throughout the lumbar spine. There is evidence of neural foraminal stenosis from L2-3 to L5-S1 due to disc and facet disease. Atherosclerotic aortoiliac calcification is noted. Pacemaker leads are partially visualized. Phleboliths are noted in the pelvis. IMPRESSION: Stable postsurgical and degenerative changes in the lumbar spine. No evidence of acute osseous abnormality. Electronically Signed   By: Logan Bores M.D.   On: 02/28/2015 09:27   Dg Foot Complete Left  02/27/2015  CLINICAL DATA:  L foot pain, heel feels like pins and needles and poking him; manipulated tube and held foot for obliques, ankle was unable to move; best possible images due to pts condition EXAM: LEFT FOOT - COMPLETE 3+ VIEW COMPARISON:  02/08/2011 FINDINGS: Anatomic distortion, secondary to hammertoe deformities. This degrades evaluation of the forefoot. No osseous destruction. First metatarsophalangeal joint osteoarthritis. Small Achilles and calcaneal spurs. No radiopaque foreign  object. Vascular calcifications. No soft tissue ulcer identified. IMPRESSION: Degenerative change, without acute osseous finding. Electronically Signed   By: Abigail Miyamoto M.D.   On: 02/27/2015 15:28   Dg Foot Complete Right  02/27/2015  CLINICAL DATA:  Right foot pain EXAM: RIGHT FOOT COMPLETE - 3+ VIEW COMPARISON:  None. FINDINGS: 4 views of the right foot submitted. There is plantar spur of calcaneus. No acute fracture or subluxation. IMPRESSION: No acute fracture or subluxation.  Plantar spurring of calcaneus. Electronically Signed   By: Lahoma Crocker M.D.   On: 02/27/2015 15:26    Labs:  Recent Labs  02/27/15 0924 03/01/15 0533  WBC 7.7 8.6  RBC 3.58* 3.42*  HCT 35.7* 34.6*  PLT 163 178    Recent Labs  02/28/15 0405 03/01/15 0533  NA 134* 136  K 4.5 3.8  CL 95* 98*  CO2 29 28  BUN 48* 43*  CREATININE 2.80* 2.82*  GLUCOSE 240* 165*  CALCIUM 9.3 9.4   No results for input(s): LABPT, INR in the last 72 hours.  Review of Systems: Review of Systems  Constitutional: Negative.   Musculoskeletal:       Bilateral heel pain    Physical Exam: Extremely pleasant elderly patient alert and oriented.  NAD.  bilat calves nontender.  bilat ankles good ROM.  Ankle ligaments stable.  Ankle joint line nontender, no ankle effusion.  Left he is moderately to markedly tender over the achilles tendon watershed and calcaneal insertion.  Preachilles bursa tender.  Moderately to marked tenderness plantar fascia calcaneal insertion.  Left heel pain with squeeze test.  Similar exam findings on the right but not as bad.  Skin warm and dry.  No signs of infection.  No respiratory distress.  abd round, nondistended.  Head normocephalic, atraumatic. EOMI.    Assessment and Plan: Left greater that right heel pain.  Plantar fasciitis, preachilles bursitis and achilles tendonitis.  Question achilles tendon tear on the left.    Reviewed xray with hospitalist, [name] and family member present.  Ordered  a cam walker for the more symptomatic left foot and this was applied before I left the floor.  I think the right is being aggravated due to overcompensating.  PT  did give stretching exercises and he will continue to do this.  Wear the boot when up and ambulating.  Family member will look into getting heel inserts for the right shoe.  F/u appt scheduled with Dr Lorin Mercy next Tuesday morning.  Patient unable to have an MRI due to pacemaker.  We may consider doing an ultrasound in the clinic.  Will discuss further with Dr Lorin Mercy.  All questions answered.  We appreciate the opportunity to participate in the care of this well respected physician.    Alyson Locket. Alvino Blood For Rodell Perna MD [facility] [phone]

## 2015-03-01 NOTE — Discharge Instructions (Signed)
ORTHO DISCHARGE INSTRUCTIONS  Continue heel stretching exercises given by physical therapist.  Wear the cam boot on the left foot when up and ambulating along with walker.  Call the office if there are any questions or concerns.

## 2015-03-01 NOTE — Discharge Summary (Addendum)
Physician Discharge Summary   Patient ID: Darrell Mulder, MD MRN: [id number] DOB/AGE: [birth year] 81 y.o.  Admit date: 02/27/2015 Discharge date: 03/01/2015  Primary Care Physician:  Darrell Spurling, MD  Discharge Diagnoses:    . Bilateral feet pain, likely due to plantar fasciitis  . Chronic diastolic heart failure ([place]) . CKD (chronic kidney disease) stage 4, GFR 15-29 ml/min (HCC) . Essential hypertension . Pacemaker - [employer] Jude . Paroxysmal atrial fibrillation, DCCV 06/16/13 . Type 2 diabetes mellitus with peripheral neuropathy [facility])   Consults:  Neurology, Dr. [facility], Dr. Lorin Mercy   Recommendations for Outpatient Follow-up:  For orthopedic recommendations,  Continue physical therapy Wear the boot when up and ambulating for the left foot, heel inserts for the right shoe Follow-up appointment scheduled with Dr. Lorin Mercy next Tuesday morning     TESTS THAT NEED FOLLOW-UP None   DIET: Carb modified diet    Allergies:   Allergies  Allergen Reactions  . Statins Other (See Comments)    rhabdomyolisis  . Aggrenox [Aspirin-Dipyridamole Er] Other (See Comments)    Dizziness  . Carvedilol Other (See Comments)    Dizziness  . Claritin [Loratadine] Other (See Comments)    Dizziness & drowsiness   . Cymbalta [Duloxetine Hcl]     Confusion   . Mucinex [Guaifenesin Er] Other (See Comments)    Dizziness, drowsiness  . Phenergan [Promethazine Hcl] Other (See Comments)    Causes severe confusion  . Plavix [Clopidogrel Bisulfate] Other (See Comments)    Dizziness  . Rapaflo [Silodosin] Other (See Comments)    Dizziness for about 30- 45  Minutes At time of preop appointment on 08/26/2014 patient denies any problems with this medication  . Travatan Z [Travoprost] Other (See Comments)    Eye irritation     Discharge Medications:   Medication List    STOP taking these medications        azithromycin 500 MG tablet  Commonly known as:  ZITHROMAX      TAKE  these medications        allopurinol 100 MG tablet  Commonly known as:  ZYLOPRIM  Take 100 mg by mouth 2 (two) times daily.     amiodarone 200 MG tablet  Commonly known as:  PACERONE  Take 1 tablet (200 mg total) by mouth at bedtime.     apixaban 2.5 MG Tabs tablet  Commonly known as:  ELIQUIS  Take 1 tablet (2.5 mg total) by mouth 2 (two) times daily.     benzonatate 100 MG capsule  Commonly known as:  TESSALON  Take 1 capsule (100 mg total) by mouth 3 (three) times daily as needed for cough.     Carboxymethylcellul-Glycerin 0.5-0.9 % Soln  Place 1 drop into both eyes 2 (two) times daily.     cefUROXime 500 MG tablet  Commonly known as:  CEFTIN  Take 1 tablet (500 mg total) by mouth daily. X 7days     ferrous sulfate 325 (65 FE) MG tablet  Take 325 mg by mouth every other day.     fexofenadine 180 MG tablet  Commonly known as:  ALLEGRA  Take 180 mg by mouth at bedtime.     insulin glargine 100 UNIT/ML injection  Commonly known as:  LANTUS  Inject 10 Units into the skin at bedtime.     isosorbide mononitrate 60 MG 24 hr tablet  Commonly known as:  IMDUR  Take 1 tablet (60 mg total) by mouth daily.     metolazone  2.5 MG tablet  Commonly known as:  ZAROXOLYN  Take 2.5 mg by mouth daily as needed (Take 30 mins prior to Demadex if weight is 178 or over).     metoprolol tartrate 25 MG tablet  Commonly known as:  LOPRESSOR  Take 1 tablet (25 mg total) by mouth daily.     nitroGLYCERIN 0.4 MG SL tablet  Commonly known as:  NITROSTAT  Place 1 tablet (0.4 mg total) under the tongue every 5 (five) minutes as needed for chest pain.     NOVOLOG FLEXPEN 100 UNIT/ML FlexPen  Generic drug:  insulin aspart  Inject 9 Units into the skin 3 (three) times daily with meals. Per sliding scale:  CBG <175 no insulin (unless consuming large amounts of carbs; 6-10 units for CBG >175 based on actual CBG and number of carbs in meal; >325 call MD     OVER THE COUNTER MEDICATION  Take 1  packet by mouth daily. Nature Made Diabetic Pak (6 tablet combo)     polyethylene glycol packet  Commonly known as:  MIRALAX / GLYCOLAX  Take 17 g by mouth 2 (two) times daily.     potassium chloride SA 20 MEQ tablet  Commonly known as:  K-DUR,KLOR-CON  Take 1 tablet (20 mEq total) by mouth daily.     pregabalin 50 MG capsule  Commonly known as:  LYRICA  Take 1 capsule (50 mg total) by mouth at bedtime as needed.     Tafluprost 0.0015 % Soln  Place 1 drop into both eyes at bedtime. ZIOPTAN     torsemide 20 MG tablet  Commonly known as:  DEMADEX  Take 3 tablets (60 mg total) by mouth daily.     traMADol 50 MG tablet  Commonly known as:  ULTRAM  Take 1 tablet (50 mg total) by mouth every 12 (twelve) hours as needed for severe pain.         Brief H and P: For complete details please refer to admission H and P, but in briefPer 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 Patient was recently hospitalized in [place] with cough and decompensated heart failure. Patient was given Erythromycin for a respiratory infection and cough resolving. Patient returned home last week. Within a day of returning home patient developed bilateral foot pain. He has a history of gout (right big toe) but has never had bilateral foot pain. It hurts to bear weight, even touch feet / heels. He was using a scooter while in [place]. Top of right foot is bruised, family unsure how this happened. No new meds other than Erythromycin. Patient did receive Flu Shot but cannot remember exactly when. tNo other complaints such as skin rash, short of breath or fevers.  Patient has a history of peripheral neuropathy. He tried Cymbalta but it made him too tired.   Hospital Course:   Bilateral foot , heel pain likely due to plantar fasciitis, bursitis or achilles tendinitis  -  Foot x-rays showed no stress fractures, lumbar spine x-ray also negative for any compression fractures.  - Uric acid within normal limits, rules out acute gout attack. Uric acid 5.4.  - Has underlying peripheral neuropathy but Cymbalta caused him to be excessively drowsy. Primary neurologist, Dr. Jannifer Franklin. Neurology was consulted however felt that this was more a musculoskeletal issue. Per PT evaluation, patient had pain  at the heel and on achilles tendon upon ambulating, more consistent with plantar fasciitis. - Orthopedics consult was obtained, Cam walker boot was placed for the more symptomatic left foot and for the right foot is recommended gel heel inserts. Follow-up appointment scheduled with Dr. Lorin Mercy. Patient is unable to have MRI due to pacemaker and arthritic aches will consider doing an ultrasound in the clinic to assess for any tear or rupture of the tendon. Patient was also placed on low-dose Neurontin as needed and tramadol for pain. He will continue physical therapy.   CKD (chronic kidney disease) stage 4, GFR 15-29 ml/min. Baseline creatine around 2.7-3.0, at 3.04 on admission - Creatinine now back to baseline, continue torsemide and Zaroxolyn  Type 2 diabetes mellitus with peripheral neuropathy; uncontrolled Continue Lantus and sliding scale insulin per home regimen   Paroxysmal atrial fibrillation.  - Currently rate controlled, sinus rhythm  - Continue amiodarone, and Eliquis.  CAD / chronic diastolic heart failure.  - Chest x-ray showed small bilateral pleural effusions, probably chronic, currently euvolemic  - Continue torsemide, takes Zaroxolyn as needed (sliding scale diuretic regimen).   Essential hypertension Continue home anti-hypertensives.   Acute bronchitis - Per patient he was admitted at [facility] earlier this month and was on antibiotics, still having significant coughing with thick sputum - Chest x-ray negative for any pneumonia, placed on  Ceftin, Tessalon, Cepacol     Day of Discharge BP 128/64 mmHg  Pulse 62  Temp(Src) 98.1 F (36.7 C) (Oral)  Resp 17  Wt 83.5 kg (184 lb 1.4 oz)  SpO2 100%  Physical Exam: General: Alert and awake oriented x3 not in any acute distress. HEENT: anicteric sclera, pupils reactive to light and accommodation CVS: S1-S2 clear no murmur rubs or gallops Chest: clear to auscultation bilaterally, no wheezing rales or rhonchi Abdomen: soft nontender, nondistended, normal bowel sounds Extremities: no cyanosis, clubbing or edema noted bilaterally Neuro: Cranial nerves II-XII intact, no focal neurological deficits   The results of significant diagnostics from this hospitalization (including imaging, microbiology, ancillary and laboratory) are listed below for reference.    LAB RESULTS: Basic Metabolic Panel:  Recent Labs Lab 02/28/15 0405 03/01/15 0533  NA 134* 136  K 4.5 3.8  CL 95* 98*  CO2 29 28  GLUCOSE 240* 165*  BUN 48* 43*  CREATININE 2.80* 2.82*  CALCIUM 9.3 9.4   Liver Function Tests: No results for input(s): AST, ALT, ALKPHOS, BILITOT, PROT, ALBUMIN in the last 168 hours. No results for input(s): LIPASE, AMYLASE in the last 168 hours. No results for input(s): AMMONIA in the last 168 hours. CBC:  Recent Labs Lab 02/27/15 0924 03/01/15 0533  WBC 7.7 8.6  NEUTROABS 5.1  --   HGB 12.0* 11.5*  HCT 35.7* 34.6*  MCV 99.7 101.2*  PLT 163 178   Cardiac Enzymes: No results for input(s): CKTOTAL, CKMB, CKMBINDEX, TROPONINI in the last 168 hours. BNP: Invalid input(s): POCBNP CBG:  Recent Labs Lab 03/01/15 0709 03/01/15 1131  GLUCAP 128* 197*    Significant Diagnostic Studies:  Dg Chest 2 View  02/27/2015  CLINICAL DATA:  81 year old male with cough and shortness of breath for the past 3 days. EXAM: CHEST  2 VIEW COMPARISON:  Chest x-ray 10/04/2014. FINDINGS: Lung volumes are normal. No acute consolidative airspace disease. Small bilateral pleural effusions.  No evidence of pulmonary edema. No suspicious appearing pulmonary nodules or masses. Mild cardiomegaly. Upper mediastinal contours are within normal limits. Atherosclerosis in the thoracic aorta. Left-sided pacemaker  device in place with lead tips projecting over the expected location of the right atrium and right ventricular apex. IMPRESSION: 1. Small bilateral pleural effusions. 2. Mild cardiomegaly. 3. Atherosclerosis. Electronically Signed   By: Vinnie Langton M.D.   On: 02/27/2015 09:19   Dg Lumbar Spine 2-3 Views  02/28/2015  CLINICAL DATA:  Bilateral leg pain and foot pain for a few days. Concern for compression fracture. EXAM: LUMBAR SPINE - 2-3 VIEW COMPARISON:  Lumbar spine CT 04/30/2013 FINDINGS: There are 5 non rib-bearing lumbar type vertebral bodies. Postsurgical changes are again seen with evidence of prior laminectomies from L2-L5. Solid intervertebral osseous fusion at L2-3 is unchanged. Vertebral alignment is unchanged with straightening of the normal lumbar lordosis and grade 1 anterolisthesis of L4 on L5. Mild disc space narrowing is again seen from L3-4 to L5-S1 with evidence of vacuum disc at L5-S1. Degenerative endplate sclerosis and osteophytosis are again seen in the mid and lower lumbar spine, with advanced facet arthrosis present throughout the lumbar spine. There is evidence of neural foraminal stenosis from L2-3 to L5-S1 due to disc and facet disease. Atherosclerotic aortoiliac calcification is noted. Pacemaker leads are partially visualized. Phleboliths are noted in the pelvis. IMPRESSION: Stable postsurgical and degenerative changes in the lumbar spine. No evidence of acute osseous abnormality. Electronically Signed   By: Logan Bores M.D.   On: 02/28/2015 09:27   Dg Foot Complete Left  02/27/2015  CLINICAL DATA:  L foot pain, heel feels like pins and needles and poking him; manipulated tube and held foot for obliques, ankle was unable to move; best possible images due to pts  condition EXAM: LEFT FOOT - COMPLETE 3+ VIEW COMPARISON:  02/08/2011 FINDINGS: Anatomic distortion, secondary to hammertoe deformities. This degrades evaluation of the forefoot. No osseous destruction. First metatarsophalangeal joint osteoarthritis. Small Achilles and calcaneal spurs. No radiopaque foreign object. Vascular calcifications. No soft tissue ulcer identified. IMPRESSION: Degenerative change, without acute osseous finding. Electronically Signed   By: Abigail Miyamoto M.D.   On: 02/27/2015 15:28   Dg Foot Complete Right  02/27/2015  CLINICAL DATA:  Right foot pain EXAM: RIGHT FOOT COMPLETE - 3+ VIEW COMPARISON:  None. FINDINGS: 4 views of the right foot submitted. There is plantar spur of calcaneus. No acute fracture or subluxation. IMPRESSION: No acute fracture or subluxation.  Plantar spurring of calcaneus. Electronically Signed   By: Lahoma Crocker M.D.   On: 02/27/2015 15:26    2D ECHO:   Disposition and Follow-up: Discharge Instructions    ([employer]) Call MD:  Anytime you have any of the following symptoms: 1) 3 pound weight gain in 24 hours or 5 pounds in 1 week 2) shortness of breath, with or without a dry hacking cough 3) swelling in the hands, feet or stomach 4) if you have to sleep on extra pillows at night in order to breathe.    Complete by:  As directed      Diet Carb Modified    Complete by:  As directed      Discharge instructions    Complete by:  As directed   You can use neurontin 100mg  at bed time as needed for the pain. If it is causing drowsiness, you can stop it.     Discharge instructions    Complete by:  As directed   Continue physical therapy Wear the boot when up and ambulating for the left foot, heel inserts for the right shoe Follow-up appointment scheduled with Dr. Lorin Mercy next  Tuesday morning     Increase activity slowly    Complete by:  As directed             DISPOSITION: Home   DISCHARGE FOLLOW-UP Follow-up Information    Follow up  with Darrell Killings, MD On 03/08/2015.   Specialty:  Orthopedic Surgery   Why:  appointment scheduled for Tuesday 25 October at 8:15AM.     Contact information:   [address] [place] [address] [phone]       Follow up with Darrell Spurling, MD. Schedule an appointment as soon as possible for a visit in 2 weeks.   Specialty:  Internal Medicine   Why:  As needed, for hospital follow-up   Contact information:   [address] [place] [address] [phone]        Time spent on Discharge: 35 minutes  Signed:   Quavion Boule M.D. [facility] 03/01/2015, 1:11 PM Pager: [phone]

## 2015-03-01 NOTE — Progress Notes (Signed)
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 Lumbar MRI (-). Pt with plantar fasciitis and bursitis    PT Comments    Pt remains very pleasant and reports decreased foot pain today. Pt slept without covers on his feet last night and was able to don CAM boot as well as right shoe to walk today with decreased pain. Pt again educated for positioning, massage and stretching to bil feet as well as wear and use of CAM boot LLE. Pt encouraged to have sleeping foot splint. All questions answered.   Follow Up Recommendations  Outpatient PT     Equipment Recommendations       Recommendations for Other Services       Precautions / Restrictions Precautions Precautions: Fall Precaution Comments: pt wears a spinal corset and Left AFO at baseline. Watch for knees buckling Restrictions Weight Bearing Restrictions: No    Mobility  Bed Mobility               General bed mobility comments: in chair on arrival  Transfers Overall transfer level: Needs assistance   Transfers: Sit to/from Stand Sit to Stand: Min assist         General transfer comment: assist to rise from surface today due to partial knee buckling  Ambulation/Gait Ambulation/Gait assistance: Min assist Ambulation Distance (Feet): 40 Feet Assistive device: Rolling walker (2 wheeled) Gait Pattern/deviations: Step-through pattern;Decreased stride length;Trunk flexed;Wide base of support   Gait velocity interpretation: Below normal speed for age/gender General Gait Details: cues for posture and position in RW. Pt  maintains flexed posture and demonstrated 3 periods of partial knee buckling today with pt able to maintain balance but sat quickly at chair on return. NOrmally uses rollator to sit quickly   [employer] (Stroke Patients Only)       Balance                                    Cognition Arousal/Alertness: Awake/alert Behavior During Therapy: WFL for tasks assessed/performed Overall Cognitive Status: Within Functional Limits for tasks assessed                      Exercises General Exercises - Lower Extremity Toe Raises: AROM;AAROM;Seated;Both;5 reps Other Exercises Other Exercises: bil achilles tendon stretch x 3 with education for AAROM of LLE with use of towel or sheet. Massage and stretching to bil foot intrinsics    General Comments        Pertinent Vitals/Pain Pain Score: 4  Pain Location: bil foot and heel pain Pain Descriptors / Indicators: Tender Pain Intervention(s): Repositioned    Home Living                      Prior Function            PT Goals (current goals can now be found in the care plan section) Progress towards PT goals: Progressing toward goals    Frequency  PT Plan Current plan remains appropriate    Co-evaluation             End of Session Equipment Utilized During Treatment: Back brace;Other (comment) (Left CAM boot) Activity Tolerance: Patient tolerated treatment well Patient left: in chair;with call bell/phone within reach     Time: 1114-1140 PT Time Calculation (min) (ACUTE ONLY): 26 min  Charges:  $Gait Training: 8-22 mins $Therapeutic Activity: 8-22 mins                    G Codes:      Melford Aase 03-28-15, 11:47 AM Elwyn Reach, [place]

## 2015-03-02 ENCOUNTER — Telehealth: Payer: Self-pay | Admitting: Interventional Cardiology

## 2015-03-02 NOTE — Telephone Encounter (Signed)
Returned caregiver call. Pt will be a little late to his appt with Dr.Smith in 10/25 @ 9am. Pt has an appt with his pcp @ [place] that pt can get here when he can

## 2015-03-02 NOTE — Telephone Encounter (Signed)
New Message   [name] states she is the Pt caregiver   she is calling to see if she needs to keep his appt on 03/08/15 for [name] or if she needs to reschedule   The appt because he has another appt with a different doctor.  i told her what Dr.Smith next available date is and she didn't approve and she wants the RN to call her back

## 2015-03-03 LAB — CULTURE, RESPIRATORY

## 2015-03-03 LAB — CULTURE, RESPIRATORY W GRAM STAIN: Culture: NORMAL

## 2015-03-07 ENCOUNTER — Other Ambulatory Visit: Payer: Self-pay | Admitting: *Deleted

## 2015-03-07 MED ORDER — ISOSORBIDE MONONITRATE ER 60 MG PO TB24: 60.0000 mg | ORAL_TABLET | Freq: Every day | ORAL | Status: AC

## 2015-03-08 ENCOUNTER — Encounter: Payer: Self-pay | Admitting: Interventional Cardiology

## 2015-03-08 ENCOUNTER — Ambulatory Visit (INDEPENDENT_AMBULATORY_CARE_PROVIDER_SITE_OTHER): Payer: Medicare Other | Admitting: Interventional Cardiology

## 2015-03-08 VITALS — BP 130/70 | HR 62 | Ht 68.0 in

## 2015-03-08 DIAGNOSIS — I208 Other forms of angina pectoris: Secondary | ICD-10-CM | POA: Diagnosis not present

## 2015-03-08 DIAGNOSIS — Z7901 Long term (current) use of anticoagulants: Secondary | ICD-10-CM

## 2015-03-08 DIAGNOSIS — N184 Chronic kidney disease, stage 4 (severe): Secondary | ICD-10-CM | POA: Diagnosis not present

## 2015-03-08 DIAGNOSIS — Z950 Presence of cardiac pacemaker: Secondary | ICD-10-CM | POA: Diagnosis not present

## 2015-03-08 DIAGNOSIS — I10 Essential (primary) hypertension: Secondary | ICD-10-CM

## 2015-03-08 DIAGNOSIS — I5032 Chronic diastolic (congestive) heart failure: Secondary | ICD-10-CM

## 2015-03-08 DIAGNOSIS — Z79899 Other long term (current) drug therapy: Secondary | ICD-10-CM

## 2015-03-08 DIAGNOSIS — I480 Paroxysmal atrial fibrillation: Secondary | ICD-10-CM

## 2015-03-08 LAB — BASIC METABOLIC PANEL
BUN: 52 mg/dL — AB (ref 7–25)
CALCIUM: 9.9 mg/dL (ref 8.6–10.3)
CO2: 34 mmol/L — ABNORMAL HIGH (ref 20–31)
CREATININE: 3.2 mg/dL — AB (ref 0.70–1.11)
Chloride: 91 mmol/L — ABNORMAL LOW (ref 98–110)
GLUCOSE: 272 mg/dL — AB (ref 65–99)
Potassium: 3.7 mmol/L (ref 3.5–5.3)
Sodium: 134 mmol/L — ABNORMAL LOW (ref 135–146)

## 2015-03-08 NOTE — Patient Instructions (Signed)
Medication Instructions:  No changes today  Labwork: BMET today  Testing/Procedures: None today  Follow-Up: Your physician recommends that you schedule a follow-up appointment in: 3 weeks with Dr [name].        If you need a refill on your cardiac medications before your next appointment, please call your pharmacy.

## 2015-03-08 NOTE — Progress Notes (Signed)
Cardiology Office Note   Date:  03/08/2015   ID:  Darrell Mulder, MD, DOB 09-14-1918, MRN [id number]  PCP:  [name], MD  Cardiologist:  [name], MD   Chief Complaint  Patient presents with  . 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  Dr. Kennon Holter traveled to [place] 2 weeks ago. Prior to returning home he developed a cough and was hospitalized at [facility]. I had multiple conversations with the physicians. Chest x-ray demonstrated volume overload. Kidney function was slightly worse than most recent data from [place]. IV diuresis improved kidney function and volume status. Because of a cough, antibiotic therapy with a Z-Pak was also completed. Since returning home, he has felt well. He denies dyspnea. He has developed bilateral plantar fasciitis and is now unable to stand. Because of this we are unable to get daily weights. [name], his caregiver, gave a dose of torsemide on 03/03/2015. He had good urine output. He is here today for clinical follow-up.    Past Medical History  Diagnosis Date  . Hyperlipidemia   . Spinal stenosis   . Anemia   . Carotid artery disease ([place])     a. s/p prior carotid endarterectomy.  [name] PVD (peripheral vascular disease) ([place])   . Hyponatremia     Chronic  . CVA (cerebral infarction)     a. When asked to clarify patient says he was told thumb numbness may be TIA. He does not recall formal stroke. CT head results are only available through [facility], not in [facility].  . CKD (chronic kidney disease) stage 4, GFR 15-29 ml/min (HCC)   . Long term (current) use of anticoagulants   . PAF  (paroxysmal atrial fibrillation) ([place])     a. Asymptomatic. On Eliquis. CHADSVASC 7.  . Sick sinus syndrome ([place])     With DDD St. Jude PM, initially placed in 1993 by Dr. Nils Pyle. Device upgrade 10/2002 to DDD, by Dr. Rollene Fare complicated by bleeding. Subsequent gen change 2011.  [name] Hypertensive cardiovascular disease   . Chronic diastolic congestive heart failure ([place])     ECHO 05/20/12 LVEF estimated by 2D at 55-60%  . Arthritis   . Gait disorder   . Diabetic peripheral neuropathy associated with type 2 diabetes mellitus ([place])   . Diabetes mellitus (HCC)     insulin dependent  . Elevated troponin I level 06/22/2014    a. 06/2014 felt due to demand ischemia.  . Chronic anticoagulation     For PAF, on Eliquis   . Urinary retention   . Pulmonary hypertension ([place])   . Foot drop, left 07/21/2014  . Asterixis 07/21/2014  . Hypertension   . Presence of permanent cardiac pacemaker   . Pneumonia     hx of pneumonia x 1     Past Surgical History  Procedure Laterality Date  . Lumbar laminectomy  1995  . Back surgery    . Total knee arthroplasty Left   . Quadriceps tendon repair    . Cataract extraction    . Carotid endarterectomy    . Yag laser application Right 08/20/8117    Procedure: YAG LASER CAPSULOTOMY OF RIGHT EYE;  Surgeon: [name],  MD;  Location: [place];  Service: Ophthalmology;  Laterality: Right;  . Tonsillectomy    . Pacemaker insertion      DDD St. Jude PM, initially placed in 1993 by Dr. Nils Pyle. Device upgrade 10/2002 to DDD, by Dr. Rollene Fare complicated by bleeding.  . Carotid endarterectomy Right 2006  . Transurethral resection of prostate    . Cardioversion N/A 06/16/2013    Procedure: CARDIOVERSION BEDSIDE;  Surgeon: [name], MD;  Location: [place];  Service: Cardiovascular;  Laterality: N/A;  . Pacemaker generator change  12/09/2009    SJM Accent DR RF gen change by Dr [name]  . Insert / replace / remove pacemaker      2004   . Transurethral  resection of prostate N/A 08/27/2014    Procedure: TRANSURETHRAL RESECTION OF THE PROSTATE WITH GYRUS INSTRUMENTS;  Surgeon: [name], MD;  [facility];  Service: Urology;  Laterality: N/A;  . Cystoscopy N/A 08/27/2014    Procedure: CYSTOSCOPY;  Surgeon: [name], MD;  [facility];  Service: Urology;  Laterality: N/A;     Current Outpatient Prescriptions  Medication Sig Dispense Refill  . allopurinol (ZYLOPRIM) 100 MG tablet Take 100 mg by mouth 2 (two) times daily.     [name] amiodarone (PACERONE) 200 MG tablet Take 1 tablet (200 mg total) by mouth at bedtime. 30 tablet 6  . apixaban (ELIQUIS) 2.5 MG TABS tablet Take 1 tablet (2.5 mg total) by mouth 2 (two) times daily. 60 tablet 11  . benzonatate (TESSALON) 100 MG capsule Take 1 capsule (100 mg total) by mouth 3 (three) times daily as needed for cough. 30 capsule 0  . Carboxymethylcellul-Glycerin 0.5-0.9 % SOLN Place 1 drop into both eyes 2 (two) times daily.     . cefUROXime (CEFTIN) 500 MG tablet Take 1 tablet (500 mg total) by mouth daily. X 7days 7 tablet 0  . ferrous sulfate 325 (65 FE) MG tablet Take 325 mg by mouth every other day.    . fexofenadine (ALLEGRA) 180 MG tablet Take 180 mg by mouth at bedtime.     . insulin aspart (NOVOLOG FLEXPEN) 100 UNIT/ML FlexPen Inject 9 Units into the skin 3 (three) times daily with meals. Per sliding scale:  CBG <175 no insulin (unless consuming large amounts of carbs; 6-10 units for CBG >175 based on actual CBG and number of carbs in meal; >325 call MD    . insulin glargine (LANTUS) 100 UNIT/ML injection Inject 10 Units into the skin at bedtime.    . isosorbide mononitrate (IMDUR) 60 MG 24 hr tablet Take 1 tablet (60 mg total) by mouth daily. 30 tablet 11  . metolazone (ZAROXOLYN) 2.5 MG tablet Take 2.5 mg by mouth daily as needed (Take 30 mins prior to Demadex if weight is 178 or over).    . metoprolol tartrate (LOPRESSOR) 25 MG tablet Take 1 tablet (25 mg total) by mouth daily. 30 tablet 11    . nitroGLYCERIN (NITROSTAT) 0.4 MG SL tablet Place 1 tablet (0.4 mg total) under the tongue every 5 (five) minutes as needed for chest pain. 25 tablet 3  . OVER THE COUNTER MEDICATION Take 1 packet by mouth daily. Nature Made Diabetic Pak (6 tablet combo)    . polyethylene glycol (MIRALAX / GLYCOLAX) packet Take 17 g by mouth 2 (two) times daily.     . potassium chloride SA (K-DUR,KLOR-CON) 20 MEQ tablet Take 1 tablet (20 mEq total) by mouth daily. 30 tablet 6  . pregabalin (  LYRICA) 50 MG capsule Take 1 capsule (50 mg total) by mouth at bedtime as needed. 30 capsule 1  . Tafluprost 0.0015 % SOLN Place 1 drop into both eyes at bedtime. ZIOPTAN    . torsemide (DEMADEX) 20 MG tablet Take 3 tablets (60 mg total) by mouth daily. (Patient taking differently: Take 80 mg by mouth daily. )    . traMADol (ULTRAM) 50 MG tablet Take 1 tablet (50 mg total) by mouth every 12 (twelve) hours as needed for severe pain. 30 tablet 0   No current facility-administered medications for this visit.    Allergies:   Statins; Aggrenox; Carvedilol; Claritin; Cymbalta; Mucinex; Phenergan; Plavix; Rapaflo; and Travatan z    Social History:  The patient  reports that he quit smoking about 56 years ago. His smoking use included Pipe and Cigarettes. He quit after 2 years of use. He has never used smokeless tobacco. He reports that he does not drink alcohol or use illicit drugs.   Family History:  The patient's family history includes COPD in his sister; Healthy in his sister; Heart failure in his sister; Hypertension in his mother; Multiple myeloma in his father.    ROS:  Please see the history of present illness.   Otherwise, review of systems are positive for bilateral plantar fasciitis. Inability to wait daily because he is unable to stand. Weight loss. No orthopnea or PND..   All other systems are reviewed and negative.    PHYSICAL EXAM: VS:  BP 130/70 mmHg  Pulse 62  Ht '5\' 8"'  (1.727 m)  SpO2 98% , BMI There is no  weight on file to calculate BMI. GEN: Well nourished, well developed, in no acute distress HEENT: normal Neck: no JVD, carotid bruits, or masses Cardiac: RRR.  There is no murmur, rub, or gallop. There is no edema. Respiratory:  clear to auscultation bilaterally, normal work of breathing. GI: soft, nontender, nondistended, + BS MS: no deformity or atrophy Skin: warm and dry, no rash Neuro:  Strength and sensation are intact Psych: euthymic mood, full affect   EKG:  EKG is not ordered today.    Recent Labs: 07/16/2014: Pro B Natriuretic peptide (BNP) 460.0* 10/14/2014: Magnesium 2.4 12/08/2014: ALT 63*; TSH 1.02 02/27/2015: B Natriuretic Peptide 325.4* 03/01/2015: BUN 43*; Creatinine, Ser 2.82*; Hemoglobin 11.5*; Platelets 178; Potassium 3.8; Sodium 136    Lipid Panel No results found for: CHOL, TRIG, HDL, CHOLHDL, VLDL, LDLCALC, LDLDIRECT    Wt Readings from Last 3 Encounters:  03/01/15 83.5 kg (184 lb 1.4 oz)  02/10/15 80.65 kg (177 lb 12.8 oz)  12/29/14 80.65 kg (177 lb 12.8 oz)      Other studies Reviewed: Additional studies/ records that were reviewed today include: recent echocardiogram from [facility] The findings include this study demonstrated LVEF 35-40%. Moderate concentric left ventricular hypertrophy. Restrictive left ventricular filling..    ASSESSMENT AND PLAN:  1. Chronic diastolic heart failure (HCC) No clinical evidence of volume overload - Basic metabolic panel  2. Essential hypertension Well controlled - Basic metabolic panel  3. CKD (chronic kidney disease) stage 4, GFR 15-29 ml/min (HCC) Basic metabolic panel today reveals a creatinine of 3.2, BUN of 52, potassium 3.7. - Basic metabolic panel  4. Pacemaker - St Jude No evidence of dysfunction - Basic metabolic panel  5. Chronic anticoagulation No bleeding complications - Basic metabolic panel  6. Paroxysmal atrial fibrillation, DCCV 06/16/13 No clinical recurrences -  Basic metabolic panel  7. Long term current use of  amiodarone No clinical toxicity is evident - Basic metabolic panel    Current medicines are reviewed at length with the patient today.  The patient has the following concerns regarding medicines:   The following changes/actions have been instituted:    Metolazone once per week was considered but with the most recent basic metabolic panel, we will use on an as-needed basis until we can began weighing the patient again.  He is unable to stand. Daily weights and not possible. Unable to do that currently because of bilateral plantar fasciitis  Follow-up in 3 weeks.  Labs/ tests ordered today include:  Orders Placed This Encounter  Procedures  . Basic metabolic panel     Disposition:   FU with HS in 3 weeks  Signed, [name], MD  03/08/2015 10:21 AM    [facility] [address], [place], [place]  [address] Phone: [phone]; Fax: [phone]

## 2015-03-09 ENCOUNTER — Other Ambulatory Visit: Payer: Self-pay | Admitting: *Deleted

## 2015-03-09 ENCOUNTER — Encounter: Payer: Medicare Other | Admitting: Internal Medicine

## 2015-03-09 MED ORDER — METOLAZONE 2.5 MG PO TABS: 2.5000 mg | ORAL_TABLET | Freq: Every day | ORAL | Status: DC | PRN

## 2015-03-11 ENCOUNTER — Emergency Department (HOSPITAL_COMMUNITY): Payer: Medicare Other

## 2015-03-11 ENCOUNTER — Emergency Department (HOSPITAL_COMMUNITY)
Admission: EM | Admit: 2015-03-11 | Discharge: 2015-03-11 | Disposition: A | Discharge status: Home / Self Care | Payer: Medicare Other | Attending: Emergency Medicine | Admitting: Emergency Medicine

## 2015-03-11 ENCOUNTER — Encounter (HOSPITAL_COMMUNITY): Payer: Self-pay | Admitting: Emergency Medicine

## 2015-03-11 DIAGNOSIS — Y939 Activity, unspecified: Secondary | ICD-10-CM | POA: Diagnosis not present

## 2015-03-11 DIAGNOSIS — I129 Hypertensive chronic kidney disease with stage 1 through stage 4 chronic kidney disease, or unspecified chronic kidney disease: Secondary | ICD-10-CM | POA: Insufficient documentation

## 2015-03-11 DIAGNOSIS — Z794 Long term (current) use of insulin: Secondary | ICD-10-CM | POA: Insufficient documentation

## 2015-03-11 DIAGNOSIS — Z87891 Personal history of nicotine dependence: Secondary | ICD-10-CM | POA: Insufficient documentation

## 2015-03-11 DIAGNOSIS — Y999 Unspecified external cause status: Secondary | ICD-10-CM | POA: Insufficient documentation

## 2015-03-11 DIAGNOSIS — M1990 Unspecified osteoarthritis, unspecified site: Secondary | ICD-10-CM | POA: Insufficient documentation

## 2015-03-11 DIAGNOSIS — Z950 Presence of cardiac pacemaker: Secondary | ICD-10-CM | POA: Diagnosis not present

## 2015-03-11 DIAGNOSIS — Z8701 Personal history of pneumonia (recurrent): Secondary | ICD-10-CM | POA: Diagnosis not present

## 2015-03-11 DIAGNOSIS — E119 Type 2 diabetes mellitus without complications: Secondary | ICD-10-CM | POA: Insufficient documentation

## 2015-03-11 DIAGNOSIS — Z79899 Other long term (current) drug therapy: Secondary | ICD-10-CM | POA: Diagnosis not present

## 2015-03-11 DIAGNOSIS — S161XXA Strain of muscle, fascia and tendon at neck level, initial encounter: Secondary | ICD-10-CM | POA: Insufficient documentation

## 2015-03-11 DIAGNOSIS — Z8673 Personal history of transient ischemic attack (TIA), and cerebral infarction without residual deficits: Secondary | ICD-10-CM | POA: Diagnosis not present

## 2015-03-11 DIAGNOSIS — Y929 Unspecified place or not applicable: Secondary | ICD-10-CM | POA: Diagnosis not present

## 2015-03-11 DIAGNOSIS — Z7901 Long term (current) use of anticoagulants: Secondary | ICD-10-CM | POA: Insufficient documentation

## 2015-03-11 DIAGNOSIS — N184 Chronic kidney disease, stage 4 (severe): Secondary | ICD-10-CM | POA: Diagnosis not present

## 2015-03-11 DIAGNOSIS — W19XXXA Unspecified fall, initial encounter: Secondary | ICD-10-CM

## 2015-03-11 DIAGNOSIS — D649 Anemia, unspecified: Secondary | ICD-10-CM | POA: Diagnosis not present

## 2015-03-11 DIAGNOSIS — I5032 Chronic diastolic (congestive) heart failure: Secondary | ICD-10-CM | POA: Diagnosis not present

## 2015-03-11 DIAGNOSIS — S0990XA Unspecified injury of head, initial encounter: Secondary | ICD-10-CM | POA: Insufficient documentation

## 2015-03-11 DIAGNOSIS — Z792 Long term (current) use of antibiotics: Secondary | ICD-10-CM | POA: Diagnosis not present

## 2015-03-11 DIAGNOSIS — W01198A Fall on same level from slipping, tripping and stumbling with subsequent striking against other object, initial encounter: Secondary | ICD-10-CM | POA: Diagnosis not present

## 2015-03-11 DIAGNOSIS — S20219A Contusion of unspecified front wall of thorax, initial encounter: Secondary | ICD-10-CM | POA: Diagnosis not present

## 2015-03-11 DIAGNOSIS — I480 Paroxysmal atrial fibrillation: Secondary | ICD-10-CM | POA: Diagnosis not present

## 2015-03-11 DIAGNOSIS — S299XXA Unspecified injury of thorax, initial encounter: Secondary | ICD-10-CM | POA: Diagnosis present

## 2015-03-11 NOTE — ED Notes (Signed)
Myself and Debbie, EMT assisted patient to bedside commode; cleaned patient when finished and assisted him to stretcher; undressed patient, placed in gown, on monitor, continuous pulse oximetry and blood pressure cuff

## 2015-03-11 NOTE — ED Provider Notes (Signed)
CSN: [id number]     Arrival date & time 03/11/15  1117 History   First MD Initiated Contact with Patient 03/11/15 1117     Chief Complaint  Patient presents with  . Dizziness     (Consider location/radiation/quality/duration/timing/severity/associated sxs/prior Treatment) HPI Comments: Patient presents to the ER for evaluation of dizziness and rib pain. Patient reports that he fell 2 days ago. His caretaker was able to catch him and help him to the ground, but he did fall and hit the back of his head on concrete. He is not having any significant headache, but has had intermittent dizziness, especially with turning his head since the fall. He also has some soreness and stiffness in his neck since he fell. Patient complaining of bilateral anterior rib pain from the fall as well. No trouble breathing.  Patient is a 81 y.o. male presenting with dizziness.  Dizziness Associated symptoms: no shortness of breath     Past Medical History  Diagnosis Date  . Hyperlipidemia   . Spinal stenosis   . Anemia   . Carotid artery disease ([place])     a. s/p prior carotid endarterectomy.  [name] PVD (peripheral vascular disease) ([place])   . Hyponatremia     Chronic  . CVA (cerebral infarction)     a. When asked to clarify patient says he was told thumb numbness may be TIA. He does not recall formal stroke. CT head results are only available through [facility], not in [facility].  . CKD (chronic kidney disease) stage 4, GFR 15-29 ml/min (HCC)   . Long term (current) use of anticoagulants   . PAF (paroxysmal atrial fibrillation) ([place])     a. Asymptomatic. On Eliquis. CHADSVASC 7.  . Sick sinus syndrome ([place])     With DDD St. Jude PM, initially placed in 1993 by Dr. Nils Pyle. Device upgrade 10/2002 to DDD, by Dr. Rollene Fare complicated by bleeding. Subsequent gen change 2011.  [name] Hypertensive cardiovascular disease   . Chronic diastolic congestive heart failure ([place])     ECHO 05/20/12 LVEF estimated by 2D at 55-60%  . Arthritis    . Gait disorder   . Diabetic peripheral neuropathy associated with type 2 diabetes mellitus ([place])   . Diabetes mellitus (HCC)     insulin dependent  . Elevated troponin I level 06/22/2014    a. 06/2014 felt due to demand ischemia.  . Chronic anticoagulation     For PAF, on Eliquis   . Urinary retention   . Pulmonary hypertension ([place])   . Foot drop, left 07/21/2014  . Asterixis 07/21/2014  . Hypertension   . Presence of permanent cardiac pacemaker   . Pneumonia     hx of pneumonia x 1    Past Surgical History  Procedure Laterality Date  . Lumbar laminectomy  1995  . Back surgery    . Total knee arthroplasty Left   . Quadriceps tendon repair    . Cataract extraction    . Carotid endarterectomy    . Yag laser application Right 7/89/3810    Procedure: YAG LASER CAPSULOTOMY OF RIGHT EYE;  Surgeon: [name], MD;  Location: [facility];  Service: Ophthalmology;  Laterality: Right;  . Tonsillectomy    . Pacemaker insertion      DDD St. Jude PM, initially placed in 1993 by Dr. Nils Pyle. Device upgrade 10/2002 to DDD, by Dr. Rollene Fare complicated by bleeding.  . Carotid endarterectomy Right 2006  . Transurethral resection of prostate    . Cardioversion  N/A 06/16/2013    Procedure: CARDIOVERSION BEDSIDE;  Surgeon: [name], MD;  Location: [facility];  Service: Cardiovascular;  Laterality: N/A;  . Pacemaker generator change  12/09/2009    SJM Accent DR RF gen change by Dr [name]  . Insert / replace / remove pacemaker      2004   . Transurethral resection of prostate N/A 08/27/2014    Procedure: TRANSURETHRAL RESECTION OF THE PROSTATE WITH GYRUS INSTRUMENTS;  Surgeon: [name], MD;  [facility];  Service: Urology;  Laterality: N/A;  . Cystoscopy N/A 08/27/2014    Procedure: CYSTOSCOPY;  Surgeon: [name], MD;  [facility];  Service: Urology;  Laterality: N/A;   Family History  Problem Relation Age of Onset  . Multiple myeloma Father   . Hypertension Mother   .  Healthy Sister   . COPD Sister   . Heart failure Sister    Social History  Substance Use Topics  . Smoking status: Former Smoker -- 2 years    Types: Pipe, Cigarettes    Quit date: 05/14/1958  . Smokeless tobacco: Never Used  . Alcohol Use: No     Comment: Cocktails occasionally    Review of Systems  Respiratory: Negative for shortness of breath.   Neurological: Positive for dizziness.  All other systems reviewed and are negative.     Allergies  Statins; Aggrenox; Carvedilol; Claritin; Cymbalta; Mucinex; Phenergan; Plavix; Rapaflo; and Travatan z  Home Medications   Prior to Admission medications   Medication Sig Start Date End Date Taking? Authorizing Provider  allopurinol (ZYLOPRIM) 100 MG tablet Take 100 mg by mouth 2 (two) times daily.     Historical Provider, MD  amiodarone (PACERONE) 200 MG tablet Take 1 tablet (200 mg total) by mouth at bedtime. 09/17/14   Belva Crome, MD  apixaban (ELIQUIS) 2.5 MG TABS tablet Take 1 tablet (2.5 mg total) by mouth 2 (two) times daily. 12/27/14   Belva Crome, MD  benzonatate (TESSALON) 100 MG capsule Take 1 capsule (100 mg total) by mouth 3 (three) times daily as needed for cough. 03/01/15   Ripudeep Krystal Eaton, MD  Carboxymethylcellul-Glycerin 0.5-0.9 % SOLN Place 1 drop into both eyes 2 (two) times daily.     Historical Provider, MD  cefUROXime (CEFTIN) 500 MG tablet Take 1 tablet (500 mg total) by mouth daily. X 7days 03/02/15   Ripudeep Krystal Eaton, MD  ferrous sulfate 325 (65 FE) MG tablet Take 325 mg by mouth every other day.    Historical Provider, MD  fexofenadine (ALLEGRA) 180 MG tablet Take 180 mg by mouth at bedtime.     Historical Provider, MD  insulin aspart (NOVOLOG FLEXPEN) 100 UNIT/ML FlexPen Inject 9 Units into the skin 3 (three) times daily with meals. Per sliding scale:  CBG <175 no insulin (unless consuming large amounts of carbs; 6-10 units for CBG >175 based on actual CBG and number of carbs in meal; >325 call MD    Historical  Provider, MD  insulin glargine (LANTUS) 100 UNIT/ML injection Inject 10 Units into the skin at bedtime.    Historical Provider, MD  isosorbide mononitrate (IMDUR) 60 MG 24 hr tablet Take 1 tablet (60 mg total) by mouth daily. 03/07/15   Belva Crome, MD  metolazone (ZAROXOLYN) 2.5 MG tablet Take 1 tablet (2.5 mg total) by mouth daily as needed (to be given only as directed by [name]). 03/09/15   Belva Crome, MD  metoprolol tartrate (LOPRESSOR) 25  MG tablet Take 1 tablet (25 mg total) by mouth daily. 02/28/15   Belva Crome, MD  nitroGLYCERIN (NITROSTAT) 0.4 MG SL tablet Place 1 tablet (0.4 mg total) under the tongue every 5 (five) minutes as needed for chest pain. 12/03/13   Belva Crome, MD  OVER THE COUNTER MEDICATION Take 1 packet by mouth daily. Nature Made Diabetic Pak (6 tablet combo)    Historical Provider, MD  polyethylene glycol (MIRALAX / GLYCOLAX) packet Take 17 g by mouth 2 (two) times daily.     Historical Provider, MD  potassium chloride SA (K-DUR,KLOR-CON) 20 MEQ tablet Take 1 tablet (20 mEq total) by mouth daily. 09/08/14   Debbe Odea, MD  pregabalin (LYRICA) 50 MG capsule Take 1 capsule (50 mg total) by mouth at bedtime as needed. 03/01/15   Ripudeep Krystal Eaton, MD  Tafluprost 0.0015 % SOLN Place 1 drop into both eyes at bedtime. Valentine Provider, MD  torsemide (DEMADEX) 20 MG tablet Take 3 tablets (60 mg total) by mouth daily. Patient taking differently: Take 80 mg by mouth daily.  01/03/15   Belva Crome, MD  traMADol (ULTRAM) 50 MG tablet Take 1 tablet (50 mg total) by mouth every 12 (twelve) hours as needed for severe pain. 03/01/15   Ripudeep K Rai, MD   BP 129/59 mmHg  Pulse 60  Temp(Src) 97.9 F (36.6 C) (Oral)  Resp 18  SpO2 97% Physical Exam  Constitutional: He is oriented to person, place, and time. He appears well-developed and well-nourished. No distress.  HENT:  Head: Normocephalic and atraumatic.  Right Ear: Hearing normal.  Left Ear: Hearing  normal.  Nose: Nose normal.  Mouth/Throat: Oropharynx is clear and moist and mucous membranes are normal.  Eyes: Conjunctivae and EOM are normal. Pupils are equal, round, and reactive to light.  Neck: Normal range of motion. Neck supple. Muscular tenderness present.  Cardiovascular: Regular rhythm, S1 normal and S2 normal.  Exam reveals no gallop and no friction rub.   No murmur heard. Pulmonary/Chest: Effort normal and breath sounds normal. No respiratory distress. He exhibits tenderness.    Abdominal: Soft. Normal appearance and bowel sounds are normal. There is no hepatosplenomegaly. There is no tenderness. There is no rebound, no guarding, no tenderness at McBurney's point and negative Murphy's sign. No hernia.  Musculoskeletal: Normal range of motion.  Neurological: He is alert and oriented to person, place, and time. He has normal strength. No cranial nerve deficit or sensory deficit. Coordination normal. GCS eye subscore is 4. GCS verbal subscore is 5. GCS motor subscore is 6.  Skin: Skin is warm, dry and intact. No rash noted. No cyanosis.  Psychiatric: He has a normal mood and affect. His speech is normal and behavior is normal. Thought content normal.  Nursing note and vitals reviewed.   ED Course  Procedures (including critical care time) Labs Review Labs Reviewed - No data to display  Imaging Review No results found. I have personally reviewed and evaluated these images and lab results as part of my medical decision-making.   EKG Interpretation   Date/Time:  Friday March 11 2015 11:30:30 EDT Ventricular Rate:  129 PR Interval:    QRS Duration: 35 QT Interval:  341 QTC Calculation: 500 R Axis:   133 Text Interpretation:  Atrial-paced rhythm Anterior infarct, age  indeterminate Non-specific intra-ventricular conduction delay No  significant change since last tracing Confirmed by Coraleigh Sheeran  MD,  Rilei Kravitz [phone]) on 03/11/2015 11:37:46 AM  MDM   Final  diagnoses:  Fall   rib contusion  Head contusion  Patient presents to the ER for evaluation after a fall. Patient actually fell 2 days ago. Since then he has been expressing intermittent dizziness. He has a normal neurologic exam on evaluation here in the ER. Patient is on blood thinner, therefore underwent CT head. CT cervical spine also performed because of neck "stiffness" after the fall. These CTs were negative.  Patient complaining of bilateral rib pain that worsens with movement. No crepitance on examination. Lungs are clear. He does have a significant history of congestive heart failure, but does not appear to have any evidence of decompensation or volume overload. Bilateral ribs with chest was negative. Patient reassured, will be discharged.    Orpah Greek, MD 03/11/15 [phone]

## 2015-03-11 NOTE — ED Notes (Signed)
Yellow non-slip socks placed on patient's feet

## 2015-03-11 NOTE — ED Notes (Signed)
Per EMS, Patient is coming from home reports bilateral leg pain with foot plantar fasciatis that caused fall on Wednesday. Pt denied dizziness before the fall on Wednesday, but has had dizziness since the fall. Pt is alert and oriented x4. Reports bilateral lower rib cage pain that worsens with deep breathing. Vitals per EMS: 140/78, 70 HR. Pt has Atrial Pacemaker.

## 2015-03-11 NOTE — Discharge Instructions (Signed)
Chest Contusion A chest contusion is a deep bruise on your chest area. Contusions are the result of an injury that caused bleeding under the skin. A chest contusion may involve bruising of the skin, muscles, or ribs. The contusion may turn blue, purple, or yellow. Minor injuries will give you a painless contusion, but more severe contusions may stay painful and swollen for a few weeks. CAUSES  A contusion is usually caused by a blow, trauma, or direct force to an area of the body. SYMPTOMS   Swelling and redness of the injured area.  Discoloration of the injured area.  Tenderness and soreness of the injured area.  Pain. DIAGNOSIS  The diagnosis can be made by taking a history and performing a physical exam. An X-ray, CT scan, or MRI may be needed to determine if there were any associated injuries, such as broken bones (fractures) or internal injuries. TREATMENT  Often, the best treatment for a chest contusion is resting, icing, and applying cold compresses to the injured area. Deep breathing exercises may be recommended to reduce the risk of pneumonia. Over-the-counter medicines may also be recommended for pain control. HOME CARE INSTRUCTIONS   Put ice on the injured area.  Put ice in a plastic bag.  Place a towel between your skin and the bag.  Leave the ice on for 15-20 minutes, 03-04 times a day.  Only take over-the-counter or prescription medicines as directed by your caregiver. Your caregiver may recommend avoiding anti-inflammatory medicines (aspirin, ibuprofen, and naproxen) for 48 hours because these medicines may increase bruising.  Rest the injured area.  Perform deep-breathing exercises as directed by your caregiver.  Stop smoking if you smoke.  Do not lift objects over 5 pounds (2.3 kg) for 3 days or longer if recommended by your caregiver. SEEK IMMEDIATE MEDICAL CARE IF:   You have increased bruising or swelling.  You have pain that is getting worse.  You have  difficulty breathing.  You have dizziness, weakness, or fainting.  You have blood in your urine or stool.  You cough up or vomit blood.  Your swelling or pain is not relieved with medicines. MAKE SURE YOU:   Understand these instructions.  Will watch your condition.  Will get help right away if you are not doing well or get worse.   This information is not intended to replace advice given to you by your health care provider. Make sure you discuss any questions you have with your health care provider.   Document Released: 01/23/2001 Document Revised: 01/23/2012 Document Reviewed: 10/22/2011 Elsevier Interactive Patient Education 2016 [employer]  Cervical Sprain A cervical sprain is an injury in the neck in which the strong, fibrous tissues (ligaments) that connect your neck bones stretch or tear. Cervical sprains can range from mild to severe. Severe cervical sprains can cause the neck vertebrae to be unstable. This can lead to damage of the spinal cord and can result in serious nervous system problems. The amount of time it takes for a cervical sprain to get better depends on the cause and extent of the injury. Most cervical sprains heal in 1 to 3 weeks. CAUSES  Severe cervical sprains may be caused by:   Contact sport injuries (such as from football, rugby, wrestling, hockey, auto racing, gymnastics, diving, martial arts, or boxing).   Motor vehicle collisions.   Whiplash injuries. This is an injury from a sudden forward and backward whipping movement of the head and neck.  Falls.  Mild cervical sprains  may be caused by:   Being in an awkward position, such as while cradling a telephone between your ear and shoulder.   Sitting in a chair that does not offer proper support.   Working at a poorly Landscape [employer] station.   Looking up or down for long periods of time.  SYMPTOMS   Pain, soreness, stiffness, or a burning sensation in the front, back, or sides of  the neck. This discomfort may develop immediately after the injury or slowly, 24 hours or more after the injury.   Pain or tenderness directly in the middle of the back of the neck.   Shoulder or upper back pain.   Limited ability to move the neck.   Headache.   Dizziness.   Weakness, numbness, or tingling in the hands or arms.   Muscle spasms.   Difficulty swallowing or chewing.   Tenderness and swelling of the neck.  DIAGNOSIS  Most of the time your health care provider can diagnose a cervical sprain by taking your history and doing a physical exam. Your health care provider will ask about previous neck injuries and any known neck problems, such as arthritis in the neck. X-rays may be taken to find out if there are any other problems, such as with the bones of the neck. Other tests, such as a CT scan or MRI, may also be needed.  TREATMENT  Treatment depends on the severity of the cervical sprain. Mild sprains can be treated with rest, keeping the neck in place (immobilization), and pain medicines. Severe cervical sprains are immediately immobilized. Further treatment is done to help with pain, muscle spasms, and other symptoms and may include:  Medicines, such as pain relievers, numbing medicines, or muscle relaxants.   Physical therapy. This may involve stretching exercises, strengthening exercises, and posture training. Exercises and improved posture can help stabilize the neck, strengthen muscles, and help stop symptoms from returning.  HOME CARE INSTRUCTIONS   Put ice on the injured area.   Put ice in a plastic bag.   Place a towel between your skin and the bag.   Leave the ice on for 15-20 minutes, 3-4 times a day.   If your injury was severe, you may have been given a cervical collar to wear. A cervical collar is a two-piece collar designed to keep your neck from moving while it heals.  Do not remove the collar unless instructed by your health care  provider.  If you have long hair, keep it outside of the collar.  Ask your health care provider before making any adjustments to your collar. Minor adjustments may be required over time to improve comfort and reduce pressure on your chin or on the back of your head.  Ifyou are allowed to remove the collar for cleaning or bathing, follow your health care provider's instructions on how to do so safely.  Keep your collar clean by wiping it with mild soap and water and drying it completely. If the collar you have been given includes removable pads, remove them every 1-2 days and hand wash them with soap and water. Allow them to air dry. They should be completely dry before you wear them in the collar.  If you are allowed to remove the collar for cleaning and bathing, wash and dry the skin of your neck. Check your skin for irritation or sores. If you see any, tell your health care provider.  Do not drive while wearing the collar.   Only take over-the-counter  or prescription medicines for pain, discomfort, or fever as directed by your health care provider.   Keep all follow-up appointments as directed by your health care provider.   Keep all physical therapy appointments as directed by your health care provider.   Make any needed adjustments to your workstation to promote good posture.   Avoid positions and activities that make your symptoms worse.   Warm up and stretch before being active to help prevent problems.  SEEK MEDICAL CARE IF:   Your pain is not controlled with medicine.   You are unable to decrease your pain medicine over time as planned.   Your activity level is not improving as expected.  SEEK IMMEDIATE MEDICAL CARE IF:   You develop any bleeding.  You develop stomach upset.  You have signs of an allergic reaction to your medicine.   Your symptoms get worse.   You develop new, unexplained symptoms.   You have numbness, tingling, weakness, or paralysis  in any part of your body.  MAKE SURE YOU:   Understand these instructions.  Will watch your condition.  Will get help right away if you are not doing well or get worse.   This information is not intended to replace advice given to you by your health care provider. Make sure you discuss any questions you have with your health care provider.   Document Released: 02/25/2007 Document Revised: 05/05/2013 Document Reviewed: 11/05/2012 Elsevier Interactive Patient Education [employer].

## 2015-03-29 ENCOUNTER — Ambulatory Visit (INDEPENDENT_AMBULATORY_CARE_PROVIDER_SITE_OTHER): Payer: Medicare Other | Admitting: Interventional Cardiology

## 2015-03-29 ENCOUNTER — Encounter: Payer: Self-pay | Admitting: Interventional Cardiology

## 2015-03-29 VITALS — BP 118/68 | HR 62

## 2015-03-29 DIAGNOSIS — I495 Sick sinus syndrome: Secondary | ICD-10-CM | POA: Diagnosis not present

## 2015-03-29 DIAGNOSIS — N184 Chronic kidney disease, stage 4 (severe): Secondary | ICD-10-CM | POA: Diagnosis not present

## 2015-03-29 DIAGNOSIS — I208 Other forms of angina pectoris: Secondary | ICD-10-CM | POA: Diagnosis not present

## 2015-03-29 DIAGNOSIS — Z950 Presence of cardiac pacemaker: Secondary | ICD-10-CM

## 2015-03-29 DIAGNOSIS — I5032 Chronic diastolic (congestive) heart failure: Secondary | ICD-10-CM | POA: Diagnosis not present

## 2015-03-29 DIAGNOSIS — Z7901 Long term (current) use of anticoagulants: Secondary | ICD-10-CM

## 2015-03-29 DIAGNOSIS — I110 Hypertensive heart disease with heart failure: Secondary | ICD-10-CM

## 2015-03-29 NOTE — Progress Notes (Signed)
Cardiology Office Note   Date:  03/29/2015   ID:  Darrell Mulder, MD, DOB 1918-05-17, MRN [id number]  PCP:  [name], MD  Cardiologist:  [name], MD   Chief Complaint  Patient presents with  . 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   Overall, doing relatively well. He has gotten a couple doses of metolazone this week rather than the single weekly dose because of dyspnea. He has had some cough. He is still unable to stand to allow [place] to weigh him because of severe plantar fasciitis. He is still going to work every day. He denies chest pain. Appetite is stable.  He saw Dr. Erling Cruz, nephrology. Dr. Florene Glen and I discussed the concept of dialysis. I discussed this with Dr. Kennon Holter today, and somewhat surprisingly to me, he feels that dialysis may not be a choice that he would make. He wants to continue the current course. I did help him to understand that we would not talk and about immediate dialysis but offering it in the future if it is required. He simply stated that he would have to give that some consideration. He is not sure that he would except chronic dialysis therapy.    Past Medical History  Diagnosis Date  . Hyperlipidemia   . Spinal stenosis   . Anemia   . Carotid artery disease ([place])     a. s/p prior carotid endarterectomy.  [name] PVD (peripheral vascular disease) ([place])   . Hyponatremia     Chronic  . CVA (cerebral infarction)     a. When asked to clarify patient says he was told thumb numbness may be TIA. He does not recall formal stroke. CT head results are only available through [facility], not in [facility].  . CKD (chronic kidney disease) stage 4, GFR 15-29 ml/min (HCC)   . Long term  (current) use of anticoagulants   . PAF (paroxysmal atrial fibrillation) ([place])     a. Asymptomatic. On Eliquis. CHADSVASC 7.  . Sick sinus syndrome ([place])     With DDD St. Jude PM, initially placed in 1993 by Dr. Nils Pyle. Device upgrade 10/2002 to DDD, by Dr. Rollene Fare complicated by bleeding. Subsequent gen change 2011.  [name] Hypertensive cardiovascular disease   . Chronic diastolic congestive heart failure ([place])     ECHO 05/20/12 LVEF estimated by 2D at 55-60%  . Arthritis   . Gait disorder   . Diabetic peripheral neuropathy associated with type 2 diabetes mellitus ([place])   . Diabetes mellitus (HCC)     insulin dependent  . Elevated troponin I level 06/22/2014    a. 06/2014 felt due to demand ischemia.  . Chronic anticoagulation     For PAF, on Eliquis   . Urinary retention   . Pulmonary hypertension ([place])   . Foot drop, left 07/21/2014  . Asterixis 07/21/2014  . Hypertension   . Presence of permanent cardiac pacemaker   . Pneumonia     hx of pneumonia x 1     Past Surgical History  Procedure Laterality Date  . Lumbar laminectomy  1995  . Back surgery    . Total knee arthroplasty Left   . Quadriceps tendon repair    . Cataract extraction    .  Carotid endarterectomy    . Yag laser application Right 1/69/6789    Procedure: YAG LASER CAPSULOTOMY OF RIGHT EYE;  Surgeon: [name], MD;  Location: [facility];  Service: Ophthalmology;  Laterality: Right;  . Tonsillectomy    . Pacemaker insertion      DDD St. Jude PM, initially placed in 1993 by Dr. Nils Pyle. Device upgrade 10/2002 to DDD, by Dr. Rollene Fare complicated by bleeding.  . Carotid endarterectomy Right 2006  . Transurethral resection of prostate    . Cardioversion N/A 06/16/2013    Procedure: CARDIOVERSION BEDSIDE;  Surgeon: [name], MD;  Location: [facility];  Service: Cardiovascular;  Laterality: N/A;  . Pacemaker generator change  12/09/2009    SJM Accent DR RF gen change by Dr [name]  . Insert / replace / remove  pacemaker      2004   . Transurethral resection of prostate N/A 08/27/2014    Procedure: TRANSURETHRAL RESECTION OF THE PROSTATE WITH GYRUS INSTRUMENTS;  Surgeon: [name], MD;  Location: [facility];  Service: Urology;  Laterality: N/A;  . Cystoscopy N/A 08/27/2014    Procedure: CYSTOSCOPY;  Surgeon: [name], MD;  Location: [facility];  Service: Urology;  Laterality: N/A;     Current Outpatient Prescriptions  Medication Sig Dispense Refill  . allopurinol (ZYLOPRIM) 100 MG tablet Take 100 mg by mouth 2 (two) times daily.     [name] amiodarone (PACERONE) 200 MG tablet Take 1 tablet (200 mg total) by mouth at bedtime. 30 tablet 6  . apixaban (ELIQUIS) 2.5 MG TABS tablet Take 1 tablet (2.5 mg total) by mouth 2 (two) times daily. 60 tablet 11  . Carboxymethylcellul-Glycerin 0.5-0.9 % SOLN Place 1 drop into both eyes 2 (two) times daily.     . ferrous sulfate 325 (65 FE) MG tablet Take 325 mg by mouth every other day.    . insulin aspart (NOVOLOG FLEXPEN) 100 UNIT/ML FlexPen Inject 9 Units into the skin 3 (three) times daily with meals. Per sliding scale:  CBG <175 no insulin (unless consuming large amounts of carbs; 6-10 units for CBG >175 based on actual CBG and number of carbs in meal; >325 call MD    . insulin glargine (LANTUS) 100 UNIT/ML injection Inject 9 Units into the skin at bedtime. Or as directed by sliding scale.    . isosorbide mononitrate (IMDUR) 60 MG 24 hr tablet Take 1 tablet (60 mg total) by mouth daily. 30 tablet 11  . metolazone (ZAROXOLYN) 2.5 MG tablet Take 1 tablet (2.5 mg total) by mouth daily as needed (to be given only as directed by [name]). 90 tablet 3  . metoprolol tartrate (LOPRESSOR) 25 MG tablet Take 1 tablet (25 mg total) by mouth daily. 30 tablet 11  . nitroGLYCERIN (NITROSTAT) 0.4 MG SL tablet Place 1 tablet (0.4 mg total) under the tongue every 5 (five) minutes as needed for chest pain. 25 tablet 3  . OVER THE COUNTER MEDICATION Take 1 packet by mouth daily. Nature Made  Diabetic Pak (6 tablet combo)    . polyethylene glycol (MIRALAX / GLYCOLAX) packet Take 17 g by mouth 2 (two) times daily.     . potassium chloride SA (K-DUR,KLOR-CON) 20 MEQ tablet Take 1 tablet (20 mEq total) by mouth daily. 30 tablet 6  . Tafluprost 0.0015 % SOLN Place 1 drop into both eyes at bedtime. ZIOPTAN    . torsemide (DEMADEX) 20 MG tablet Take 80 mg by mouth daily.  No current facility-administered medications for this visit.    Allergies:   Statins; Aggrenox; Carvedilol; Claritin; Cymbalta; Lyrica; Mucinex; Other; Phenergan; Plavix; Rapaflo; and Travatan z    Social History:  The patient  reports that he quit smoking about 56 years ago. His smoking use included Pipe and Cigarettes. He quit after 2 years of use. He has never used smokeless tobacco. He reports that he does not drink alcohol or use illicit drugs.   Family History:  The patient's family history includes COPD in his sister; Healthy in his sister; Heart failure in his sister; Hypertension in his mother; Multiple myeloma in his father.    ROS:  Please see the history of present illness.   Otherwise, review of systems are positive for bilateral plantar fasciitis making it difficult to stand. Appetite is not as consistently good as previous. He denies falls. He is having cough at night..   All other systems are reviewed and negative.    PHYSICAL EXAM: VS:  BP 118/68 mmHg  Pulse 62  SpO2 98% , BMI There is no weight on file to calculate BMI. GEN: Well nourished, well developed, in no acute distress HEENT: normal Neck: no JVD, carotid bruits, or masses Cardiac: RRR.  There is no murmur, rub, or gallop. There is no edema. Skin turgor is decreased. Respiratory:  clear to auscultation bilaterally, normal work of breathing. GI: soft, nontender, nondistended, + BS MS: no deformity or atrophy Skin: warm and dry, no rash Neuro:  Strength and sensation are intact Psych: euthymic mood, full affect   EKG:  EKG is not  ordered today   Recent Labs: 07/16/2014: Pro B Natriuretic peptide (BNP) 460.0* 10/14/2014: Magnesium 2.4 12/08/2014: ALT 63*; TSH 1.02 02/27/2015: B Natriuretic Peptide 325.4* 03/01/2015: Hemoglobin 11.5*; Platelets 178 03/08/2015: BUN 52*; Creat 3.20*; Potassium 3.7; Sodium 134*    Lipid Panel No results found for: CHOL, TRIG, HDL, CHOLHDL, VLDL, LDLCALC, LDLDIRECT    Wt Readings from Last 3 Encounters:  03/01/15 83.5 kg (184 lb 1.4 oz)  02/10/15 80.65 kg (177 lb 12.8 oz)  12/29/14 80.65 kg (177 lb 12.8 oz)      Other studies Reviewed: Additional studies/ records that were reviewed today include: Hospital records from [facility] were reviewed.. The findings include creatinine upon admission was 3.2, BUN 75, and BNP 6000. LVEF by echo was 40%..    ASSESSMENT AND PLAN:  1. Chronic diastolic heart failure ([place]) Based upon recent data from [facility] evaluation, we may be done with development of some component of systolic heart failure. He still has some subjective dyspnea  2. CKD (chronic kidney disease) stage 4, GFR 15-29 ml/min (HCC) Stage IV chronic kidney disease. Has seen Dr. Florene Glen, Nephrology. Overall, Dr. B is not certain he will except dialysis.  3. Sick sinus syndrome [facility]) Pacemaker functioning normally. Atrial fibrillation suppression with amiodarone  4. Pacemaker - St Jude Normal function  5. Long-term (current) use of anticoagulants No bleeding complications but his kidney function worsens, we will need to consider decreasing or discontinuing eliquis.  6. Hypertensive heart disease with heart failure (HCC) Note difficulty with blood pressure control    Current medicines are reviewed at length with the patient today.  The patient has the following concerns regarding medicines: Question concerning the current dose of torsemide. It should be 80 mg per day..  The following changes/actions have been instituted:    Continue  torsemide 80 mg per day  Supplement diuresis with metolazone 2.5 mg as  needed to maintain adequate weight and achieve diuresis as needed based upon clinical volume status.  Continue to explore his feelings about dialysis. Right now he has a relatively negative concept of the idea.  Basic metabolic panel  Labs/ tests ordered today include:   Orders Placed This Encounter  Procedures  . Basic metabolic panel     Disposition:   FU with HS in 3 weeks  Signed, [name], MD  03/29/2015 5:05 PM    [facility] [place], [place], [place]  [address] Phone: [phone]; Fax: [phone]

## 2015-03-29 NOTE — Patient Instructions (Addendum)
Medication Instructions:  Your physician recommends that you continue on your current medications as directed. Please refer to the Current Medication list given to you today.   Labwork: Bmet today  Testing/Procedures: None ordered  Follow-Up: You have a follow up appointment on 04/15/15 @ 10am  Any Other Special Instructions Will Be Listed Below (If Applicable).     If you need a refill on your cardiac medications before your next appointment, please call your pharmacy.

## 2015-03-30 LAB — BASIC METABOLIC PANEL
BUN: 47 mg/dL — AB (ref 7–25)
CHLORIDE: 92 mmol/L — AB (ref 98–110)
CO2: 28 mmol/L (ref 20–31)
CREATININE: 2.93 mg/dL — AB (ref 0.70–1.11)
Calcium: 10.1 mg/dL (ref 8.6–10.3)
Glucose, Bld: 84 mg/dL (ref 65–99)
Potassium: 5.2 mmol/L (ref 3.5–5.3)
Sodium: 135 mmol/L (ref 135–146)

## 2015-03-31 ENCOUNTER — Telehealth: Payer: Self-pay

## 2015-03-31 DIAGNOSIS — I5032 Chronic diastolic (congestive) heart failure: Secondary | ICD-10-CM

## 2015-03-31 MED ORDER — METOLAZONE 2.5 MG PO TABS: ORAL_TABLET | ORAL | Status: DC

## 2015-03-31 NOTE — Telephone Encounter (Signed)
-----   Message from Belva Crome, MD sent at 03/30/2015  6:23 PM EST ----- The patient's kidney function is improved. The potassium level is 5.2. We should increase the metolazone dosing to twice per week on Monday and Thursdays. Basic metabolic panel should be repeated in one week, on 04/06/2015.

## 2015-03-31 NOTE — Telephone Encounter (Signed)
Pt caretaker and pt aware of lab results and Dr.Smith's recommendation. The patient's kidney function is improved. The potassium level is 5.2. We should increase the metolazone dosing to twice per week on Monday and Thursdays. Basic metabolic panel should be repeated in one week, on 04/06/2015. Lab appt scheduled for 11/23. Pt and and pt caretaker [name] verbalized understanding.

## 2015-04-06 ENCOUNTER — Other Ambulatory Visit: Payer: Medicare Other | Admitting: *Deleted

## 2015-04-06 DIAGNOSIS — I5032 Chronic diastolic (congestive) heart failure: Secondary | ICD-10-CM

## 2015-04-06 LAB — BASIC METABOLIC PANEL
BUN: 57 mg/dL — AB (ref 7–25)
CHLORIDE: 87 mmol/L — AB (ref 98–110)
CO2: 34 mmol/L — AB (ref 20–31)
CREATININE: 3.09 mg/dL — AB (ref 0.70–1.11)
Calcium: 9.9 mg/dL (ref 8.6–10.3)
Glucose, Bld: 279 mg/dL — ABNORMAL HIGH (ref 65–99)
Potassium: 3.8 mmol/L (ref 3.5–5.3)
Sodium: 132 mmol/L — ABNORMAL LOW (ref 135–146)

## 2015-04-13 ENCOUNTER — Encounter: Payer: Self-pay | Admitting: Internal Medicine

## 2015-04-13 ENCOUNTER — Ambulatory Visit (INDEPENDENT_AMBULATORY_CARE_PROVIDER_SITE_OTHER): Payer: Medicare Other | Admitting: Internal Medicine

## 2015-04-13 VITALS — BP 138/64 | HR 67 | Ht 68.0 in

## 2015-04-13 DIAGNOSIS — N184 Chronic kidney disease, stage 4 (severe): Secondary | ICD-10-CM | POA: Diagnosis not present

## 2015-04-13 DIAGNOSIS — I480 Paroxysmal atrial fibrillation: Secondary | ICD-10-CM

## 2015-04-13 DIAGNOSIS — Z950 Presence of cardiac pacemaker: Secondary | ICD-10-CM | POA: Diagnosis not present

## 2015-04-13 DIAGNOSIS — I110 Hypertensive heart disease with heart failure: Secondary | ICD-10-CM

## 2015-04-13 DIAGNOSIS — I208 Other forms of angina pectoris: Secondary | ICD-10-CM

## 2015-04-13 LAB — CUP PACEART INCLINIC DEVICE CHECK
Brady Statistic RA Percent Paced: 99.73 %
Brady Statistic RV Percent Paced: 46 %
Date Time Interrogation Session: 20161130144339
Implantable Lead Implant Date: 20040617
Implantable Lead Location: 753859
Lead Channel Impedance Value: 562.5 Ohm
Lead Channel Pacing Threshold Amplitude: 1.25 V
Lead Channel Pacing Threshold Amplitude: 1.25 V
Lead Channel Pacing Threshold Pulse Width: 0.6 ms
Lead Channel Setting Pacing Amplitude: 2 V
Lead Channel Setting Pacing Amplitude: 2.5 V
Lead Channel Setting Sensing Sensitivity: 2 mV
MDC IDC LEAD IMPLANT DT: 20040617
MDC IDC LEAD LOCATION: 753860
MDC IDC MSMT BATTERY REMAINING LONGEVITY: 49.2
MDC IDC MSMT BATTERY VOLTAGE: 2.89 V
MDC IDC MSMT LEADCHNL RA IMPEDANCE VALUE: 237.5 Ohm
MDC IDC MSMT LEADCHNL RA PACING THRESHOLD AMPLITUDE: 0.75 V
MDC IDC MSMT LEADCHNL RA PACING THRESHOLD PULSEWIDTH: 0.8 ms
MDC IDC MSMT LEADCHNL RV PACING THRESHOLD PULSEWIDTH: 0.6 ms
MDC IDC MSMT LEADCHNL RV SENSING INTR AMPL: 12 mV
MDC IDC SET LEADCHNL RV PACING PULSEWIDTH: 0.6 ms
Pulse Gen Serial Number: 7151645

## 2015-04-13 NOTE — Progress Notes (Signed)
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 male with a h/o sick sinus syndrome sp PPM  initially in 1993 who presents today for follow-up in the electrophysiology device clinic.  His most recent generator change was by Dr Melrose Nakayama 12/09/2009.  The patient reports doing very well since having a pacemaker implanted and remains very active despite his age.  He has had recent decline.  He is followed by Dr Florene Glen for renal failure and was recently seen by Dr [name] with discussions regarding long term plans for renal failure.   He has occasional weakness.   Today, he  denies symptoms of palpitations, chest pain, shortness of breath, orthopnea, PND, lower extremity edema, dizziness, presyncope, syncope, or neurologic sequela.  The patientis tolerating medications without difficulties and is otherwise without complaint today.   Past Medical History  Diagnosis Date  . Hyperlipidemia   . Spinal stenosis   . Anemia   . Carotid artery disease ([place])     a. s/p prior carotid endarterectomy.  [name] PVD (peripheral vascular disease) ([place])   . Hyponatremia     Chronic  . CVA (cerebral infarction)     a. When asked to clarify patient says he was told thumb numbness may be TIA. He does not recall formal stroke. CT head results are only available through [facility], not in [facility].  . CKD (chronic kidney disease) stage 4, GFR 15-29 ml/min (HCC)   . Long term (current) use of anticoagulants   . PAF (paroxysmal atrial fibrillation) ([place])     a. Asymptomatic. On Eliquis. CHADSVASC 7.  . Sick sinus syndrome ([place])     With DDD St. Jude PM, initially placed in 1993 by Dr. Nils Pyle. Device upgrade 10/2002 to DDD, by Dr. Rollene Fare complicated by bleeding. Subsequent gen change 2011.  [name] Hypertensive cardiovascular disease   . Chronic diastolic congestive heart failure ([place])     ECHO 05/20/12 LVEF estimated by 2D at 55-60%  . Arthritis   . Gait disorder   . Diabetic peripheral neuropathy  associated with type 2 diabetes mellitus ([place])   . Diabetes mellitus (HCC)     insulin dependent  . Elevated troponin I level 06/22/2014    a. 06/2014 felt due to demand ischemia.  . Chronic anticoagulation     For PAF, on Eliquis   . Urinary retention   . Pulmonary hypertension ([place])   . Foot drop, left 07/21/2014  . Asterixis 07/21/2014  . Hypertension   . Presence of permanent cardiac pacemaker   . Pneumonia     hx of pneumonia x 1    Past Surgical History  Procedure Laterality Date  . Lumbar laminectomy  1995  . Back surgery    . Total knee arthroplasty Left   . Quadriceps tendon repair    . Cataract extraction    . Carotid endarterectomy    . Yag laser application Right 01/28/9149    Procedure: YAG LASER CAPSULOTOMY OF RIGHT EYE;  Surgeon: [name], [name];  Location: [place];  Service: Ophthalmology;  Laterality: Right;  . Tonsillectomy    . Pacemaker insertion      DDD St. Jude PM, initially placed in 1993 by Dr. Nils Pyle. Device upgrade 10/2002 to DDD, by Dr. Rollene Fare complicated by bleeding.  . Carotid endarterectomy Right 2006  . Transurethral resection of prostate    . Cardioversion N/A 06/16/2013    Procedure: CARDIOVERSION BEDSIDE;  Surgeon: Belva Crome  III, [name];  Location: [place];  Service: Cardiovascular;  Laterality: N/A;  . Pacemaker generator change  12/09/2009    SJM Accent DR RF gen change by Dr [name]  . Insert / replace / remove pacemaker      2004   . Transurethral resection of prostate N/A 08/27/2014    Procedure: TRANSURETHRAL RESECTION OF THE PROSTATE WITH GYRUS INSTRUMENTS;  Surgeon: [name], [name];  [facility];  Service: Urology;  Laterality: N/A;  . Cystoscopy N/A 08/27/2014    Procedure: CYSTOSCOPY;  Surgeon: [name], [name];  [facility];  Service: Urology;  Laterality: N/A;    Social History   Social History  . Marital Status: Widowed    Spouse Name: N/A  . Number of Children: 7  . Years of Education: [name]   Occupational History  .  [name]    Social History Main Topics  . Smoking status: Former Smoker -- 2 years    Types: Pipe, Cigarettes    Quit date: 05/14/1958  . Smokeless tobacco: Never Used  . Alcohol Use: No     Comment: Cocktails occasionally  . Drug Use: No  . Sexual Activity: Not Currently   Other Topics Concern  . Not on file   Social History Narrative   Has a caregiver that lives with him. Still works regularly as a Immunologist       Family History  Problem Relation Age of Onset  . Multiple myeloma Father   . Hypertension Mother   . Healthy Sister   . COPD Sister   . Heart failure Sister     Allergies  Allergen Reactions  . Statins Other (See Comments)    rhabdomyolisis  . Aggrenox [Aspirin-Dipyridamole Er] Other (See Comments)    Dizziness  . Carvedilol Other (See Comments)    Dizziness  . Claritin [Loratadine] Other (See Comments)    Dizziness & drowsiness   . Cymbalta [Duloxetine Hcl]     Confusion   . Lyrica [Pregabalin] Other (See Comments)    Makes him dizzy. Takes him days to recover.  . Mucinex [Guaifenesin Er] Other (See Comments)    Dizziness, drowsiness  . Other Other (See Comments)    Preservatives in glaucoma gtts cause eye irritation, has to use preservative free  . Phenergan [Promethazine Hcl] Other (See Comments)    Causes severe confusion  . Plavix [Clopidogrel Bisulfate] Other (See Comments)    Dizziness  . Rapaflo [Silodosin] Other (See Comments)    Dizziness for about 30- 45  Minutes At time of preop appointment on 08/26/2014 patient denies any problems with this medication  . Travatan Z [Travoprost] Other (See Comments)    Eye irritation    Current Outpatient Prescriptions  Medication Sig Dispense Refill  . allopurinol (ZYLOPRIM) 100 MG tablet Take 100 mg by mouth 2 (two) times daily.     [name] amiodarone (PACERONE) 200 MG tablet Take 1 tablet (200 mg total) by mouth at bedtime. 30 tablet 6  . apixaban (ELIQUIS) 2.5 MG TABS tablet Take 1 tablet  (2.5 mg total) by mouth 2 (two) times daily. 60 tablet 11  . Carboxymethylcellul-Glycerin 0.5-0.9 % SOLN Place 1 drop into both eyes 2 (two) times daily.     . ferrous sulfate 325 (65 FE) MG tablet Take 325 mg by mouth every other day.    . insulin aspart (NOVOLOG FLEXPEN) 100 UNIT/ML FlexPen Inject 9 Units into the skin 3 (three) times daily with meals. Per sliding scale:  CBG <  175 no insulin (unless consuming large amounts of carbs; 6-10 units for CBG >175 based on actual CBG and number of carbs in meal; >325 call [name]    . insulin glargine (LANTUS) 100 UNIT/ML injection Inject 9 Units into the skin at bedtime. Or as directed by sliding scale.    . isosorbide mononitrate (IMDUR) 60 MG 24 hr tablet Take 1 tablet (60 mg total) by mouth daily. 30 tablet 11  . metolazone (ZAROXOLYN) 2.5 MG tablet Take 1 tablet 21mn before Torsemide on Mon and Wed    . metoprolol tartrate (LOPRESSOR) 25 MG tablet Take 1 tablet (25 mg total) by mouth daily. 30 tablet 11  . nitroGLYCERIN (NITROSTAT) 0.4 MG SL tablet Place 1 tablet (0.4 mg total) under the tongue every 5 (five) minutes as needed for chest pain. 25 tablet 3  . OVER THE COUNTER MEDICATION Take 1 packet by mouth daily. Nature Made Diabetic Pak (6 tablet combo)    . polyethylene glycol (MIRALAX / GLYCOLAX) packet Take 17 g by mouth 2 (two) times daily.     . potassium chloride SA (K-DUR,KLOR-CON) 20 MEQ tablet Take 1 tablet (20 mEq total) by mouth daily. 30 tablet 6  . Tafluprost 0.0015 % SOLN Place 1 drop into both eyes at bedtime. ZIOPTAN    . torsemide (DEMADEX) 20 MG tablet Take 80 mg by mouth daily.     No current facility-administered medications for this visit.    ROS- all systems are reviewed and negative except as per HPI  Physical Exam: Filed Vitals:   04/13/15 1226  BP: 138/64  Pulse: 67  Height: _0  (1.727 m)    GEN- The patient is elderly appearing, alert and oriented x 3 today.   Head- normocephalic, atraumatic Eyes-  Sclera clear,  conjunctiva pink Ears- hearing intact Oropharynx- clear Neck- supple  Lungs- Clear to ausculation bilaterally, normal work of breathing Chest- pacemaker pocket is well healed Heart- Regular rate and rhythm, no murmurs, rubs or gallops, PMI not laterally displaced GI- soft, NT, ND, + BS Extremities- no clubbing, cyanosis, or edema Neuro- strength and sensation are intact  Pacemaker interrogation- reviewed in detail today,  See PACEART report  Assessment and Plan:  1. Sick sinus syndrome Normal pacemaker function See Pace Art report Atrial lead impedance (lead from 1993) is 200 Ohms again today (unchanged from last year).  Threshold and sensing appear preserved.   Continue to follow with Merlin.  Given his advanced age, we will try to avoid lead revision if able.  2. Hypertensive cardiovascular disease Stable No change required today  3. Atrial fibrillation Asymptomatic Dr STamala Julianto follow closely.   Given declining renal function, he should probably switch from eliquis to coumadin (eliquis not recommended for CrCl <30).  I will defer to Dr STamala [name]  4. CRI As above  Merlin remote monitoring I will see in 1 year in the device clinic  JThompson GrayerMD, FSt. Joseph Medical Center11/30/2016 11:21 PM

## 2015-04-13 NOTE — Patient Instructions (Signed)
Medication Instructions:  Your physician recommends that you continue on your current medications as directed. Please refer to the Current Medication list given to you today.   Labwork: None ordered   Testing/Procedures: None ordered   Follow-Up: Remote monitoring is used to monitor your Pacemaker  from home. This monitoring reduces the number of office visits required to check your device to one time per year. It allows [place] to keep an eye on the functioning of your device to ensure it is working properly. You are scheduled for a device check from home on 07/13/15. You may send your transmission at any time that day. If you have a wireless device, the transmission will be sent automatically. After your physician reviews your transmission, you will receive a postcard with your next transmission date.   Your physician wants you to follow-up in: 12 months with Dr Vallery Ridge will receive a reminder letter in the mail two months in advance. If you don't receive a letter, please call our office to schedule the follow-up appointment.   Any Other Special Instructions Will Be Listed Below (If Applicable).     If you need a refill on your cardiac medications before your next appointment, please call your pharmacy.

## 2015-04-15 ENCOUNTER — Ambulatory Visit (INDEPENDENT_AMBULATORY_CARE_PROVIDER_SITE_OTHER): Payer: Medicare Other | Admitting: Interventional Cardiology

## 2015-04-15 ENCOUNTER — Other Ambulatory Visit: Payer: Self-pay | Admitting: *Deleted

## 2015-04-15 ENCOUNTER — Encounter: Payer: Self-pay | Admitting: Interventional Cardiology

## 2015-04-15 VITALS — BP 104/58 | HR 67

## 2015-04-15 DIAGNOSIS — I495 Sick sinus syndrome: Secondary | ICD-10-CM

## 2015-04-15 DIAGNOSIS — I208 Other forms of angina pectoris: Secondary | ICD-10-CM

## 2015-04-15 DIAGNOSIS — Z7901 Long term (current) use of anticoagulants: Secondary | ICD-10-CM | POA: Diagnosis not present

## 2015-04-15 DIAGNOSIS — I5042 Chronic combined systolic (congestive) and diastolic (congestive) heart failure: Secondary | ICD-10-CM

## 2015-04-15 DIAGNOSIS — N184 Chronic kidney disease, stage 4 (severe): Secondary | ICD-10-CM | POA: Diagnosis not present

## 2015-04-15 DIAGNOSIS — I110 Hypertensive heart disease with heart failure: Secondary | ICD-10-CM

## 2015-04-15 DIAGNOSIS — E876 Hypokalemia: Secondary | ICD-10-CM

## 2015-04-15 DIAGNOSIS — I480 Paroxysmal atrial fibrillation: Secondary | ICD-10-CM

## 2015-04-15 DIAGNOSIS — E1121 Type 2 diabetes mellitus with diabetic nephropathy: Secondary | ICD-10-CM

## 2015-04-15 LAB — CBC WITH DIFFERENTIAL/PLATELET
BASOS PCT: 0 % (ref 0–1)
Basophils Absolute: 0 10*3/uL (ref 0.0–0.1)
EOS ABS: 0.1 10*3/uL (ref 0.0–0.7)
Eosinophils Relative: 1 % (ref 0–5)
HCT: 38.1 % — ABNORMAL LOW (ref 39.0–52.0)
Hemoglobin: 12.2 g/dL — ABNORMAL LOW (ref 13.0–17.0)
Lymphocytes Relative: 22 % (ref 12–46)
Lymphs Abs: 1.3 10*3/uL (ref 0.7–4.0)
MCH: 33.3 pg (ref 26.0–34.0)
MCHC: 32 g/dL (ref 30.0–36.0)
MCV: 104.1 fL — ABNORMAL HIGH (ref 78.0–100.0)
MONO ABS: 0.5 10*3/uL (ref 0.1–1.0)
MONOS PCT: 9 % (ref 3–12)
MPV: 11 fL (ref 8.6–12.4)
Neutro Abs: 4.1 10*3/uL (ref 1.7–7.7)
Neutrophils Relative %: 68 % (ref 43–77)
PLATELETS: 183 10*3/uL (ref 150–400)
RBC: 3.66 MIL/uL — AB (ref 4.22–5.81)
RDW: 15.3 % (ref 11.5–15.5)
WBC: 6 10*3/uL (ref 4.0–10.5)

## 2015-04-15 LAB — BASIC METABOLIC PANEL
BUN: 65 mg/dL — AB (ref 7–25)
CO2: 35 mmol/L — ABNORMAL HIGH (ref 20–31)
CREATININE: 3.45 mg/dL — AB (ref 0.70–1.11)
Calcium: 9.6 mg/dL (ref 8.6–10.3)
Chloride: 88 mmol/L — ABNORMAL LOW (ref 98–110)
Glucose, Bld: 291 mg/dL — ABNORMAL HIGH (ref 65–99)
POTASSIUM: 3.3 mmol/L — AB (ref 3.5–5.3)
Sodium: 134 mmol/L — ABNORMAL LOW (ref 135–146)

## 2015-04-15 LAB — HEMOGLOBIN A1C
HEMOGLOBIN A1C: 8.8 % — AB (ref <5.7)
MEAN PLASMA GLUCOSE: 206 mg/dL — AB (ref <117)

## 2015-04-15 LAB — BRAIN NATRIURETIC PEPTIDE: BRAIN NATRIURETIC PEPTIDE: 283.3 pg/mL — AB (ref 0.0–100.0)

## 2015-04-15 NOTE — Patient Instructions (Addendum)
Medication Instructions:   Your physician has recommended you make the following change in your medication:  1.  DECREASE the Metolazone from twice a week to only taking it on Wednesdays   Labwork: TODAY:  A1C                CBC W/DIFF                BMP                BNP                 Testing/Procedures: None ordered  Follow-Up: Your physician wants you to follow-up in: [place].     Any Other Special Instructions Will Be Listed Below (If Applicable).   If you need a refill on your cardiac medications before your next appointment, please call your pharmacy.

## 2015-04-15 NOTE — Progress Notes (Signed)
Cardiology Office Note   Date:  04/15/2015   ID:  Darrell Mulder, MD, DOB 1919/01/01, MRN [id number]  PCP:  [name], MD  Cardiologist:  [name], MD   Chief Complaint  Patient presents with  . 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  Dr. 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 He is having bilateral plantar fasciitis. He has been unable to weigh, which has taken away of very vital metric to prevent recurrent episodes of heart failure. He has not been able to walk. Kidney function has deteriorated with serum creatinine levels in the 3 range with estimated GFR in the teens versus the high 20s to 30 as previous. He has not had angina. He has dyspnea on exertion. It is difficult to tell if the dyspnea is from volume or deconditioning.  Has occasional orthostatic dizziness. He is sleeping well. His appetite is doing well.    Past Medical History  Diagnosis Date  . Hyperlipidemia   . Spinal stenosis   . Anemia   . Carotid artery disease ([place])     a. s/p prior carotid endarterectomy.  [name] PVD (peripheral vascular disease) ([place])   . Hyponatremia     Chronic  . CVA (cerebral infarction)     a. When asked to clarify patient says he was told thumb numbness may be TIA. He does not recall formal stroke. CT head results are only available through [facility], not in [facility].  . CKD (chronic kidney disease) stage 4, GFR 15-29 ml/min (HCC)   . Long term (current) use of anticoagulants   . PAF (paroxysmal atrial fibrillation) ([place])     a. Asymptomatic. On Eliquis. CHADSVASC 7.  . Sick sinus syndrome ([place])     With DDD St. Jude PM, initially placed in 1993 by Dr. Nils Pyle. Device  upgrade 10/2002 to DDD, by Dr. Rollene Fare complicated by bleeding. Subsequent gen change 2011.  [name] Hypertensive cardiovascular disease   . Chronic diastolic congestive heart failure ([place])     ECHO 05/20/12 LVEF estimated by 2D at 55-60%  . Arthritis   . Gait disorder   . Diabetic peripheral neuropathy associated with type 2 diabetes mellitus ([place])   . Diabetes mellitus (HCC)     insulin dependent  . Elevated troponin I level 06/22/2014    a. 06/2014 felt due to demand ischemia.  . Chronic anticoagulation     For PAF, on Eliquis   . Urinary retention   . Pulmonary hypertension ([place])   . Foot drop, left 07/21/2014  . Asterixis 07/21/2014  . Hypertension   . Presence of permanent cardiac pacemaker   . Pneumonia     hx of pneumonia x 1     Past Surgical History  Procedure Laterality Date  . Lumbar laminectomy  1995  . Back surgery    . Total knee arthroplasty Left   . Quadriceps tendon repair    . Cataract extraction    . Carotid endarterectomy    . Yag laser application Right 08/13/270    Procedure: YAG LASER CAPSULOTOMY OF RIGHT EYE;  Surgeon: [name], MD;  Location: [facility];  Service: Ophthalmology;  Laterality: Right;  . Tonsillectomy    .  Pacemaker insertion      DDD St. Jude PM, initially placed in 1993 by Dr. Nils Pyle. Device upgrade 10/2002 to DDD, by Dr. Rollene Fare complicated by bleeding.  . Carotid endarterectomy Right 2006  . Transurethral resection of prostate    . Cardioversion N/A 06/16/2013    Procedure: CARDIOVERSION BEDSIDE;  Surgeon: [name], MD;  Location: [facility];  Service: Cardiovascular;  Laterality: N/A;  . Pacemaker generator change  12/09/2009    SJM Accent DR RF gen change by Dr [name]  . Insert / replace / remove pacemaker      2004   . Transurethral resection of prostate N/A 08/27/2014    Procedure: TRANSURETHRAL RESECTION OF THE PROSTATE WITH GYRUS INSTRUMENTS;  Surgeon: [name], MD;  [facility];  Service: Urology;  Laterality: N/A;    . Cystoscopy N/A 08/27/2014    Procedure: CYSTOSCOPY;  Surgeon: [name], MD;  [facility];  Service: Urology;  Laterality: N/A;     Current Outpatient Prescriptions  Medication Sig Dispense Refill  . allopurinol (ZYLOPRIM) 100 MG tablet Take 100 mg by mouth 2 (two) times daily.     [name] amiodarone (PACERONE) 200 MG tablet Take 1 tablet (200 mg total) by mouth at bedtime. 30 tablet 6  . apixaban (ELIQUIS) 2.5 MG TABS tablet Take 1 tablet (2.5 mg total) by mouth 2 (two) times daily. 60 tablet 11  . Carboxymethylcellul-Glycerin 0.5-0.9 % SOLN Place 1 drop into both eyes 2 (two) times daily.     . ferrous sulfate 325 (65 FE) MG tablet Take 325 mg by mouth every other day.    . insulin aspart (NOVOLOG FLEXPEN) 100 UNIT/ML FlexPen Inject 9 Units into the skin 3 (three) times daily with meals. Per sliding scale:  CBG <175 no insulin (unless consuming large amounts of carbs; 6-10 units for CBG >175 based on actual CBG and number of carbs in meal; >325 call MD    . insulin glargine (LANTUS) 100 UNIT/ML injection Inject 9 Units into the skin at bedtime. Or as directed by sliding scale.    . isosorbide mononitrate (IMDUR) 60 MG 24 hr tablet Take 1 tablet (60 mg total) by mouth daily. 30 tablet 11  . metolazone (ZAROXOLYN) 2.5 MG tablet Take one (1) tablet (2.5 mg total) by mouth 30 minutes before Torsemide on Wednesdays    . metoprolol tartrate (LOPRESSOR) 25 MG tablet Take 1 tablet (25 mg total) by mouth daily. 30 tablet 11  . nitroGLYCERIN (NITROSTAT) 0.4 MG SL tablet Place 1 tablet (0.4 mg total) under the tongue every 5 (five) minutes as needed for chest pain. 25 tablet 3  . OVER THE COUNTER MEDICATION Take 1 packet by mouth daily. Nature Made Diabetic Pak (6 tablet combo)    . polyethylene glycol (MIRALAX / GLYCOLAX) packet Take 17 g by mouth 2 (two) times daily.     . potassium chloride SA (K-DUR,KLOR-CON) 20 MEQ tablet Take 1 tablet (20 mEq total) by mouth daily. 30 tablet 6  . Tafluprost  0.0015 % SOLN Place 1 drop into both eyes at bedtime. ZIOPTAN    . torsemide (DEMADEX) 20 MG tablet Take 80 mg by mouth daily.     No current facility-administered medications for this visit.    Allergies:   Statins; Aggrenox; Carvedilol; Claritin; Cymbalta; Lyrica; Mucinex; Other; Phenergan; Plavix; Rapaflo; and Travatan z    Social History:  The patient  reports that he quit smoking about 56 years ago. His smoking use included Pipe  and Cigarettes. He quit after 2 years of use. He has never used smokeless tobacco. He reports that he does not drink alcohol or use illicit drugs.   Family History:  The patient's family history includes COPD in his sister; Healthy in his sister; Heart failure in his sister; Hypertension in his mother; Multiple myeloma in his father.    ROS:  Please see the history of present illness.   Otherwise, review of systems are positive for he feels he has now developed with dagger in the left great toe. He feels weak. He feels thirsty and wonders if he is dehydrated. We have previously increased metolazone to twice per week. This was a head checked to prevent volume overload since we are unable to weigh him. We have been previously using metolazone as needed based upon weight increase..   All other systems are reviewed and negative.    PHYSICAL EXAM: VS:  BP 104/58 mmHg  Pulse 67  SpO2 98% , BMI There is no weight on file to calculate BMI. GEN: Well nourished, well developed, in no acute distress HEENT: normal Neck: no JVD, carotid bruits, or masses Cardiac: RRR.  There is no murmur, rub, or gallop. There is no edema. Respiratory:  clear to auscultation bilaterally, normal work of breathing. GI: soft, nontender, nondistended, + BS MS: no deformity or atrophy Skin: warm and dry, no rash Neuro:  Strength and sensation are intact. Psych: euthymic mood, full affect   EKG:  EKG is not ordered today   Recent Labs: 07/16/2014: Pro B Natriuretic peptide (BNP)  460.0* 10/14/2014: Magnesium 2.4 12/08/2014: ALT 63*; TSH 1.02 02/27/2015: B Natriuretic Peptide 325.4* 03/01/2015: Hemoglobin 11.5*; Platelets 178 04/06/2015: BUN 57*; Creat 3.09*; Potassium 3.8; Sodium 132*    Lipid Panel No results found for: CHOL, TRIG, HDL, CHOLHDL, VLDL, LDLCALC, LDLDIRECT    Wt Readings from Last 3 Encounters:  03/01/15 184 lb 1.4 oz (83.5 kg)  02/10/15 177 lb 12.8 oz (80.65 kg)  12/29/14 177 lb 12.8 oz (80.65 kg)      Other studies Reviewed: Additional studies/ records that were reviewed today include: Reviewed electronic medical records. The findings include reviewed laboratory data and increase in creatinine..    ASSESSMENT AND PLAN:  1. Chronic combined systolic and diastolic CHF, NYHA class 4 (HCC) We are having a difficult time finding the manage volume status - Basic Metabolic Panel (BMET) - B Nat Peptide  2. Chronic anticoagulation Kidney function has deteriorated to a point that Eliquis therapy is not recommended. Alternative is Coumadin therapy. He does not want to be on Coumadin. He has had a previous embolic stroke. After some discussion, we have decided to continue on her current path with low dose Eliquis and vigilantly watch for evidence of bleeding. - B Nat Peptide  3. Paroxysmal atrial fibrillation (HCC) No clinical recurrences - B Nat Peptide  4. CKD (chronic kidney disease) stage 4, GFR 15-29 ml/min (HCC) Worsening. Has not excluded the possibility of dialysis if it becomes necessary.  5. Hypertensive heart disease with heart failure (HCC) No data - CBC w/Diff  6. Type II diabetes mellitus with nephropathy ([place]) Followed by Dr. Jeanann Lewandowsky. Will check blood work today and 14 him - HgB A1c  7. Sick sinus syndrome (HCC) Stable and treated with pacemaker    Current medicines are reviewed at length with the patient today.  The patient has the following concerns regarding medicines: Long discussion concerning anticoagulation  and impact of declining kidney function on Eliquis use.  We will make no changes for the time being..  The following changes/actions have been instituted:    Continue Eliquis at 2.5 mg twice a day  Decrease Zaroxolyn to 2.5 mg once per week, on Wednesdays.  Blood work will be obtained today.  Labs/ tests ordered today include:  Orders Placed This Encounter  Procedures  . Basic Metabolic Panel (BMET)  . HgB A1c  . CBC w/Diff  . B Nat Peptide     Disposition:   FU with HS in 3 week  Signed, [name], MD  04/15/2015 12:30 PM    [facility] [address], [place], [place]  [address] Phone: [phone]; Fax: [phone]

## 2015-04-18 ENCOUNTER — Inpatient Hospital Stay (HOSPITAL_COMMUNITY)
Admission: EM | Admit: 2015-04-18 | Discharge: 2015-04-19 | DRG: 683 | Disposition: A | Discharge status: Home / Self Care | Payer: Medicare Other | Attending: Internal Medicine | Admitting: Internal Medicine

## 2015-04-18 ENCOUNTER — Encounter (HOSPITAL_COMMUNITY): Payer: Self-pay | Admitting: Emergency Medicine

## 2015-04-18 ENCOUNTER — Emergency Department (HOSPITAL_COMMUNITY): Payer: Medicare Other

## 2015-04-18 ENCOUNTER — Telehealth: Payer: Self-pay

## 2015-04-18 DIAGNOSIS — Z7901 Long term (current) use of anticoagulants: Secondary | ICD-10-CM

## 2015-04-18 DIAGNOSIS — I10 Essential (primary) hypertension: Secondary | ICD-10-CM | POA: Diagnosis present

## 2015-04-18 DIAGNOSIS — M21372 Foot drop, left foot: Secondary | ICD-10-CM | POA: Diagnosis present

## 2015-04-18 DIAGNOSIS — I130 Hypertensive heart and chronic kidney disease with heart failure and stage 1 through stage 4 chronic kidney disease, or unspecified chronic kidney disease: Secondary | ICD-10-CM | POA: Diagnosis present

## 2015-04-18 DIAGNOSIS — E1122 Type 2 diabetes mellitus with diabetic chronic kidney disease: Secondary | ICD-10-CM | POA: Diagnosis present

## 2015-04-18 DIAGNOSIS — M109 Gout, unspecified: Secondary | ICD-10-CM | POA: Diagnosis present

## 2015-04-18 DIAGNOSIS — I5022 Chronic systolic (congestive) heart failure: Secondary | ICD-10-CM

## 2015-04-18 DIAGNOSIS — J90 Pleural effusion, not elsewhere classified: Secondary | ICD-10-CM | POA: Diagnosis present

## 2015-04-18 DIAGNOSIS — N138 Other obstructive and reflux uropathy: Secondary | ICD-10-CM | POA: Diagnosis present

## 2015-04-18 DIAGNOSIS — Z87891 Personal history of nicotine dependence: Secondary | ICD-10-CM | POA: Diagnosis not present

## 2015-04-18 DIAGNOSIS — K5900 Constipation, unspecified: Secondary | ICD-10-CM | POA: Diagnosis present

## 2015-04-18 DIAGNOSIS — E871 Hypo-osmolality and hyponatremia: Secondary | ICD-10-CM | POA: Diagnosis present

## 2015-04-18 DIAGNOSIS — N184 Chronic kidney disease, stage 4 (severe): Secondary | ICD-10-CM | POA: Diagnosis present

## 2015-04-18 DIAGNOSIS — M1990 Unspecified osteoarthritis, unspecified site: Secondary | ICD-10-CM | POA: Diagnosis present

## 2015-04-18 DIAGNOSIS — N179 Acute kidney failure, unspecified: Secondary | ICD-10-CM | POA: Diagnosis not present

## 2015-04-18 DIAGNOSIS — E1142 Type 2 diabetes mellitus with diabetic polyneuropathy: Secondary | ICD-10-CM

## 2015-04-18 DIAGNOSIS — Z96652 Presence of left artificial knee joint: Secondary | ICD-10-CM | POA: Diagnosis present

## 2015-04-18 DIAGNOSIS — N2889 Other specified disorders of kidney and ureter: Secondary | ICD-10-CM

## 2015-04-18 DIAGNOSIS — I272 Other secondary pulmonary hypertension: Secondary | ICD-10-CM | POA: Diagnosis present

## 2015-04-18 DIAGNOSIS — Z794 Long term (current) use of insulin: Secondary | ICD-10-CM

## 2015-04-18 DIAGNOSIS — E785 Hyperlipidemia, unspecified: Secondary | ICD-10-CM | POA: Diagnosis present

## 2015-04-18 DIAGNOSIS — R339 Retention of urine, unspecified: Secondary | ICD-10-CM | POA: Diagnosis present

## 2015-04-18 DIAGNOSIS — I495 Sick sinus syndrome: Secondary | ICD-10-CM

## 2015-04-18 DIAGNOSIS — M4800 Spinal stenosis, site unspecified: Secondary | ICD-10-CM | POA: Diagnosis present

## 2015-04-18 DIAGNOSIS — E872 Acidosis, unspecified: Secondary | ICD-10-CM | POA: Diagnosis present

## 2015-04-18 DIAGNOSIS — I5042 Chronic combined systolic (congestive) and diastolic (congestive) heart failure: Secondary | ICD-10-CM | POA: Diagnosis present

## 2015-04-18 DIAGNOSIS — Z79899 Other long term (current) drug therapy: Secondary | ICD-10-CM | POA: Diagnosis not present

## 2015-04-18 DIAGNOSIS — Z950 Presence of cardiac pacemaker: Secondary | ICD-10-CM | POA: Diagnosis not present

## 2015-04-18 DIAGNOSIS — Z8673 Personal history of transient ischemic attack (TIA), and cerebral infarction without residual deficits: Secondary | ICD-10-CM

## 2015-04-18 DIAGNOSIS — E876 Hypokalemia: Secondary | ICD-10-CM | POA: Diagnosis not present

## 2015-04-18 DIAGNOSIS — M722 Plantar fascial fibromatosis: Secondary | ICD-10-CM | POA: Diagnosis present

## 2015-04-18 DIAGNOSIS — R338 Other retention of urine: Secondary | ICD-10-CM

## 2015-04-18 DIAGNOSIS — I480 Paroxysmal atrial fibrillation: Secondary | ICD-10-CM | POA: Diagnosis present

## 2015-04-18 DIAGNOSIS — N401 Enlarged prostate with lower urinary tract symptoms: Secondary | ICD-10-CM | POA: Diagnosis present

## 2015-04-18 DIAGNOSIS — E860 Dehydration: Secondary | ICD-10-CM | POA: Diagnosis present

## 2015-04-18 DIAGNOSIS — E1151 Type 2 diabetes mellitus with diabetic peripheral angiopathy without gangrene: Secondary | ICD-10-CM | POA: Diagnosis present

## 2015-04-18 DIAGNOSIS — K5909 Other constipation: Secondary | ICD-10-CM

## 2015-04-18 HISTORY — DX: Plantar fascial fibromatosis: M72.2

## 2015-04-18 LAB — GLUCOSE, CAPILLARY: Glucose-Capillary: 253 mg/dL — ABNORMAL HIGH (ref 65–99)

## 2015-04-18 LAB — CBC WITH DIFFERENTIAL/PLATELET
BASOS PCT: 0 %
Basophils Absolute: 0 10*3/uL (ref 0.0–0.1)
EOS ABS: 0.2 10*3/uL (ref 0.0–0.7)
Eosinophils Relative: 2 %
HCT: 40.2 % (ref 39.0–52.0)
HEMOGLOBIN: 13.6 g/dL (ref 13.0–17.0)
LYMPHS PCT: 31 %
Lymphs Abs: 2.4 10*3/uL (ref 0.7–4.0)
MCH: 34.6 pg — AB (ref 26.0–34.0)
MCHC: 33.8 g/dL (ref 30.0–36.0)
MCV: 102.3 fL — AB (ref 78.0–100.0)
Monocytes Absolute: 0.9 10*3/uL (ref 0.1–1.0)
Monocytes Relative: 12 %
NEUTROS ABS: 4.1 10*3/uL (ref 1.7–7.7)
Neutrophils Relative %: 55 %
PLATELETS: 162 10*3/uL (ref 150–400)
RBC: 3.93 MIL/uL — ABNORMAL LOW (ref 4.22–5.81)
RDW: 15.5 % (ref 11.5–15.5)
WBC: 7.6 10*3/uL (ref 4.0–10.5)

## 2015-04-18 LAB — URINALYSIS, ROUTINE W REFLEX MICROSCOPIC
Bilirubin Urine: NEGATIVE
Glucose, UA: NEGATIVE mg/dL
Hgb urine dipstick: NEGATIVE
Ketones, ur: NEGATIVE mg/dL
LEUKOCYTES UA: NEGATIVE
NITRITE: NEGATIVE
PH: 7.5 (ref 5.0–8.0)
Protein, ur: NEGATIVE mg/dL
SPECIFIC GRAVITY, URINE: 1.012 (ref 1.005–1.030)

## 2015-04-18 LAB — COMPREHENSIVE METABOLIC PANEL
ALT: 74 U/L — AB (ref 17–63)
AST: 95 U/L — AB (ref 15–41)
Albumin: 3.1 g/dL — ABNORMAL LOW (ref 3.5–5.0)
Alkaline Phosphatase: 56 U/L (ref 38–126)
Anion gap: 10 (ref 5–15)
BILIRUBIN TOTAL: 0.6 mg/dL (ref 0.3–1.2)
BUN: 68 mg/dL — AB (ref 6–20)
CO2: 37 mmol/L — ABNORMAL HIGH (ref 22–32)
CREATININE: 3.48 mg/dL — AB (ref 0.61–1.24)
Calcium: 10.2 mg/dL (ref 8.9–10.3)
Chloride: 90 mmol/L — ABNORMAL LOW (ref 101–111)
GFR, EST AFRICAN AMERICAN: 16 mL/min — AB (ref >60)
GFR, EST NON AFRICAN AMERICAN: 14 mL/min — AB (ref >60)
Glucose, Bld: 196 mg/dL — ABNORMAL HIGH (ref 65–99)
Potassium: 3.9 mmol/L (ref 3.5–5.1)
Sodium: 137 mmol/L (ref 135–145)
Total Protein: 6.8 g/dL (ref 6.5–8.1)

## 2015-04-18 LAB — I-STAT CG4 LACTIC ACID, ED
LACTIC ACID, VENOUS: 3.24 mmol/L — AB (ref 0.5–2.0)
Lactic Acid, Venous: 2.77 mmol/L (ref 0.5–2.0)

## 2015-04-18 LAB — PROTIME-INR
INR: 1.22 (ref 0.00–1.49)
Prothrombin Time: 15.5 seconds — ABNORMAL HIGH (ref 11.6–15.2)

## 2015-04-18 LAB — LIPASE, BLOOD: Lipase: 26 U/L (ref 11–51)

## 2015-04-18 LAB — LACTIC ACID, PLASMA
LACTIC ACID, VENOUS: 2 mmol/L (ref 0.5–2.0)
Lactic Acid, Venous: 2.3 mmol/L (ref 0.5–2.0)

## 2015-04-18 MED ORDER — SODIUM CHLORIDE 0.9 % IV SOLN: INTRAVENOUS | Status: DC

## 2015-04-18 MED ORDER — TORSEMIDE 20 MG PO TABS
80.0000 mg | ORAL_TABLET | Freq: Every day | ORAL | Status: DC
Start: 2015-04-19 — End: 2015-04-19
  Administered 2015-04-19: 80 mg via ORAL
  Filled 2015-04-18: qty 4

## 2015-04-18 MED ORDER — SENNA 8.6 MG PO TABS
2.0000 | ORAL_TABLET | Freq: Every day | ORAL | Status: DC
  Administered 2015-04-19: 17.2 mg via ORAL
  Filled 2015-04-18: qty 2

## 2015-04-18 MED ORDER — ACETAMINOPHEN 650 MG RE SUPP: 650.0000 mg | Freq: Four times a day (QID) | RECTAL | Status: DC | PRN

## 2015-04-18 MED ORDER — SODIUM CHLORIDE 0.9 % IV BOLUS (SEPSIS)
250.0000 mL | Freq: Once | INTRAVENOUS | Status: AC
  Administered 2015-04-18: 250 mL via INTRAVENOUS

## 2015-04-18 MED ORDER — INSULIN ASPART 100 UNIT/ML ~~LOC~~ SOLN
0.0000 [IU] | Freq: Three times a day (TID) | SUBCUTANEOUS | Status: DC
  Administered 2015-04-19: 7 [IU] via SUBCUTANEOUS
  Administered 2015-04-19: 5 [IU] via SUBCUTANEOUS

## 2015-04-18 MED ORDER — ONDANSETRON HCL 4 MG/2ML IJ SOLN: 4.0000 mg | Freq: Four times a day (QID) | INTRAMUSCULAR | Status: DC | PRN

## 2015-04-18 MED ORDER — BARIUM SULFATE 2.1 % PO SUSP
450.0000 mL | Freq: Two times a day (BID) | ORAL | Status: DC
  Administered 2015-04-18: 450 mL via ORAL

## 2015-04-18 MED ORDER — INSULIN GLARGINE 100 UNIT/ML ~~LOC~~ SOLN
9.0000 [IU] | Freq: Every day | SUBCUTANEOUS | Status: DC
Start: 2015-04-18 — End: 2015-04-19
  Administered 2015-04-18: 9 [IU] via SUBCUTANEOUS
  Filled 2015-04-18 (×2): qty 0.09

## 2015-04-18 MED ORDER — AMIODARONE HCL 200 MG PO TABS
200.0000 mg | ORAL_TABLET | Freq: Every day | ORAL | Status: DC
  Administered 2015-04-18: 200 mg via ORAL
  Filled 2015-04-18: qty 1

## 2015-04-18 MED ORDER — INFLUENZA VAC SPLIT QUAD 0.5 ML IM SUSY
0.5000 mL | PREFILLED_SYRINGE | INTRAMUSCULAR | Status: DC
  Filled 2015-04-18: qty 0.5

## 2015-04-18 MED ORDER — ALLOPURINOL 100 MG PO TABS
100.0000 mg | ORAL_TABLET | Freq: Two times a day (BID) | ORAL | Status: DC
  Administered 2015-04-18 – 2015-04-19 (×2): 100 mg via ORAL
  Filled 2015-04-18 (×2): qty 1

## 2015-04-18 MED ORDER — POTASSIUM CHLORIDE CRYS ER 20 MEQ PO TBCR
20.0000 meq | EXTENDED_RELEASE_TABLET | Freq: Every day | ORAL | Status: DC
  Administered 2015-04-18 – 2015-04-19 (×2): 20 meq via ORAL
  Filled 2015-04-18 (×2): qty 1

## 2015-04-18 MED ORDER — POLYVINYL ALCOHOL 1.4 % OP SOLN
1.0000 [drp] | Freq: Two times a day (BID) | OPHTHALMIC | Status: DC
  Administered 2015-04-19: 1 [drp] via OPHTHALMIC
  Filled 2015-04-18: qty 15

## 2015-04-18 MED ORDER — SODIUM CHLORIDE 0.9 % IV BOLUS (SEPSIS)
500.0000 mL | Freq: Once | INTRAVENOUS | Status: AC
  Administered 2015-04-18: 500 mL via INTRAVENOUS

## 2015-04-18 MED ORDER — ONDANSETRON HCL 4 MG PO TABS: 4.0000 mg | ORAL_TABLET | Freq: Four times a day (QID) | ORAL | Status: DC | PRN

## 2015-04-18 MED ORDER — MILK AND MOLASSES ENEMA
1.0000 | Freq: Once | RECTAL | Status: DC
  Filled 2015-04-18: qty 250

## 2015-04-18 MED ORDER — SODIUM CHLORIDE 0.9 % IJ SOLN
3.0000 mL | Freq: Two times a day (BID) | INTRAMUSCULAR | Status: DC
  Administered 2015-04-18 – 2015-04-19 (×2): 3 mL via INTRAVENOUS

## 2015-04-18 MED ORDER — POLYETHYLENE GLYCOL 3350 17 G PO PACK
17.0000 g | PACK | Freq: Two times a day (BID) | ORAL | Status: DC
  Administered 2015-04-18 – 2015-04-19 (×2): 17 g via ORAL
  Filled 2015-04-18 (×2): qty 1

## 2015-04-18 MED ORDER — APIXABAN 2.5 MG PO TABS
2.5000 mg | ORAL_TABLET | Freq: Two times a day (BID) | ORAL | Status: DC
Start: 2015-04-18 — End: 2015-04-19
  Administered 2015-04-18 – 2015-04-19 (×2): 2.5 mg via ORAL
  Filled 2015-04-18 (×2): qty 1

## 2015-04-18 MED ORDER — LATANOPROST 0.005 % OP SOLN
1.0000 [drp] | Freq: Every day | OPHTHALMIC | Status: DC
  Filled 2015-04-18: qty 2.5

## 2015-04-18 MED ORDER — TAMSULOSIN HCL 0.4 MG PO CAPS
0.4000 mg | ORAL_CAPSULE | Freq: Every day | ORAL | Status: DC
  Administered 2015-04-18 – 2015-04-19 (×2): 0.4 mg via ORAL
  Filled 2015-04-18 (×2): qty 1

## 2015-04-18 MED ORDER — METOPROLOL TARTRATE 25 MG PO TABS
25.0000 mg | ORAL_TABLET | Freq: Every day | ORAL | Status: DC
  Administered 2015-04-19: 25 mg via ORAL
  Filled 2015-04-18: qty 1

## 2015-04-18 MED ORDER — METOLAZONE 2.5 MG PO TABS: 2.5000 mg | ORAL_TABLET | ORAL | Status: DC

## 2015-04-18 MED ORDER — BARIUM SULFATE 2.1 % PO SUSP
ORAL | Status: AC
  Administered 2015-04-18: 450 mL via ORAL
  Filled 2015-04-18: qty 2

## 2015-04-18 MED ORDER — ACETAMINOPHEN 325 MG PO TABS
650.0000 mg | ORAL_TABLET | Freq: Four times a day (QID) | ORAL | Status: DC | PRN
  Administered 2015-04-19: 650 mg via ORAL
  Filled 2015-04-18: qty 2

## 2015-04-18 MED ORDER — ISOSORBIDE MONONITRATE ER 60 MG PO TB24
60.0000 mg | ORAL_TABLET | Freq: Every day | ORAL | Status: DC
  Administered 2015-04-19: 60 mg via ORAL
  Filled 2015-04-18: qty 1

## 2015-04-18 NOTE — ED Notes (Signed)
Advised by MD Little to hold off on enema until after CT scan results.

## 2015-04-18 NOTE — Progress Notes (Signed)
Pt got admitted on the Unit from ED with a diagnosis of Lactic acidosis, Vitals stable, in RA, admission assessment done, pt is refusing IV fluid at this point since family members wants to consult their primary cardiologist first, will continue to monitor the patient.

## 2015-04-18 NOTE — ED Notes (Signed)
Pt up to bedside commode with 2 person assist. Pt had medium sized, soft brown bowel movement. Urinated 50 cc clear yellow urine.

## 2015-04-18 NOTE — ED Provider Notes (Signed)
CSN: [id number]     Arrival date & time 04/18/15  0802 History   First MD Initiated Contact with Patient 04/18/15 0813     Chief Complaint  Patient presents with  . 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 Patient reports several days of constipation despite taking MiraLAX. In contrast to triage note which states 3 days of abdominal pain, the patient denies any abdominal pain at rest but states that he has some periumbilical abdominal pain only when straining to have a bowel movement. He denies any pain currently. He also reports urinary retention; he has a hx of BPH requiring TURP previously but states that urinary retention has not been a problem recently until he developed constipation. He feels like the constipation is the cause of his retention. He denies any vomiting or fevers.  Patient is a 81 y.o. male presenting with abdominal pain. The history is provided by the patient.  Abdominal Pain   Past Medical History  Diagnosis Date  . Hyperlipidemia   . Spinal stenosis   . Anemia   . Carotid artery disease ([place])     a. s/p prior carotid endarterectomy.  [name] PVD (peripheral vascular disease) ([place])   . Hyponatremia     Chronic  . CVA (cerebral infarction)     a. When asked to clarify patient says he was told thumb numbness may be TIA. He does not recall formal stroke. CT head results are only available through [facility], not in [facility].  . CKD (chronic kidney disease) stage 4, GFR 15-29 ml/min (HCC)   . Long term (current) use of anticoagulants   . PAF (paroxysmal atrial fibrillation) ([place])     a. Asymptomatic. On Eliquis. CHADSVASC 7.  . Sick sinus syndrome ([place])     With DDD St. Jude PM, initially placed in 1993 by Dr. Nils Pyle. Device upgrade 10/2002 to DDD, by Dr. Rollene Fare  complicated by bleeding. Subsequent gen change 2011.  [name] Hypertensive cardiovascular disease   . Chronic diastolic congestive heart failure ([place])     ECHO 05/20/12 LVEF estimated by 2D at 55-60%  . Arthritis   . Gait disorder   . Diabetic peripheral neuropathy associated with type 2 diabetes mellitus ([place])   . Diabetes mellitus (HCC)     insulin dependent  . Elevated troponin I level 06/22/2014    a. 06/2014 felt due to demand ischemia.  . Chronic anticoagulation     For PAF, on Eliquis   . Urinary retention   . Pulmonary hypertension ([place])   . Foot drop, left 07/21/2014  . Asterixis 07/21/2014  . Hypertension   . Presence of permanent cardiac pacemaker   . Pneumonia     hx of pneumonia x 1    Past Surgical History  Procedure Laterality Date  . Lumbar laminectomy  1995  . Back surgery    . Total knee arthroplasty Left   . Quadriceps tendon repair    . Cataract extraction    . Carotid endarterectomy    . Yag laser application Right 08/20/8117    Procedure: YAG LASER CAPSULOTOMY OF RIGHT EYE;  Surgeon: [name], MD;  Location: [facility];  Service: Ophthalmology;  Laterality: Right;  . Tonsillectomy    . Pacemaker insertion      DDD St. Jude PM, initially placed in 1993 by Dr.  Nichols. Device upgrade 10/2002 to DDD, by Dr. Rollene Fare complicated by bleeding.  . Carotid endarterectomy Right 2006  . Transurethral resection of prostate    . Cardioversion N/A 06/16/2013    Procedure: CARDIOVERSION BEDSIDE;  Surgeon: [name], MD;  Location: [facility];  Service: Cardiovascular;  Laterality: N/A;  . Pacemaker generator change  12/09/2009    SJM Accent DR RF gen change by Dr [name]  . Insert / replace / remove pacemaker      2004   . Transurethral resection of prostate N/A 08/27/2014    Procedure: TRANSURETHRAL RESECTION OF THE PROSTATE WITH GYRUS INSTRUMENTS;  Surgeon: [name], MD;  Location: [facility];  Service: Urology;  Laterality: N/A;  . Cystoscopy N/A 08/27/2014    Procedure:  CYSTOSCOPY;  Surgeon: [name], MD;  Location: [facility];  Service: Urology;  Laterality: N/A;   Family History  Problem Relation Age of Onset  . Multiple myeloma Father   . Hypertension Mother   . Healthy Sister   . COPD Sister   . Heart failure Sister    Social History  Substance Use Topics  . Smoking status: Former Smoker -- 2 years    Types: Pipe, Cigarettes    Quit date: 05/14/1958  . Smokeless tobacco: Never Used  . Alcohol Use: No     Comment: Cocktails occasionally    Review of Systems  Gastrointestinal: Positive for abdominal pain.   10 Systems reviewed and are negative for acute change except as noted in the HPI.    Allergies  Statins; Aggrenox; Carvedilol; Claritin; Cymbalta; Lyrica; Mucinex; Other; Phenergan; Plavix; Rapaflo; and Travatan z  Home Medications   Prior to Admission medications   Medication Sig Start Date End Date Taking? Authorizing Provider  allopurinol (ZYLOPRIM) 100 MG tablet Take 100 mg by mouth 2 (two) times daily.     Historical Provider, MD  amiodarone (PACERONE) 200 MG tablet Take 1 tablet (200 mg total) by mouth at bedtime. 09/17/14   Belva Crome, MD  apixaban (ELIQUIS) 2.5 MG TABS tablet Take 1 tablet (2.5 mg total) by mouth 2 (two) times daily. 12/27/14   Belva Crome, MD  Carboxymethylcellul-Glycerin 0.5-0.9 % SOLN Place 1 drop into both eyes 2 (two) times daily.     Historical Provider, MD  ferrous sulfate 325 (65 FE) MG tablet Take 325 mg by mouth every other day.    Historical Provider, MD  insulin aspart (NOVOLOG FLEXPEN) 100 UNIT/ML FlexPen Inject 9 Units into the skin 3 (three) times daily with meals. Per sliding scale:  CBG <175 no insulin (unless consuming large amounts of carbs; 6-10 units for CBG >175 based on actual CBG and number of carbs in meal; >325 call MD    Historical Provider, MD  insulin glargine (LANTUS) 100 UNIT/ML injection Inject 9 Units into the skin at bedtime. Or as directed by sliding scale.    Historical  Provider, MD  isosorbide mononitrate (IMDUR) 60 MG 24 hr tablet Take 1 tablet (60 mg total) by mouth daily. 03/07/15   Belva Crome, MD  metolazone (ZAROXOLYN) 2.5 MG tablet Take one (1) tablet (2.5 mg total) by mouth 30 minutes before Torsemide on Wednesdays    Historical Provider, MD  metoprolol tartrate (LOPRESSOR) 25 MG tablet Take 1 tablet (25 mg total) by mouth daily. 02/28/15   Belva Crome, MD  nitroGLYCERIN (NITROSTAT) 0.4 MG SL tablet Place 1 tablet (0.4 mg total) under the tongue every 5 (five) minutes as needed for  chest pain. 12/03/13   Belva Crome, MD  OVER THE COUNTER MEDICATION Take 1 packet by mouth daily. Nature Made Diabetic Pak (6 tablet combo)    Historical Provider, MD  polyethylene glycol (MIRALAX / GLYCOLAX) packet Take 17 g by mouth 2 (two) times daily.     Historical Provider, MD  potassium chloride SA (K-DUR,KLOR-CON) 20 MEQ tablet Take 1 tablet (20 mEq total) by mouth daily. 09/08/14   Debbe Odea, MD  Tafluprost 0.0015 % SOLN Place 1 drop into both eyes at bedtime. [place] Provider, MD  torsemide (DEMADEX) 20 MG tablet Take 80 mg by mouth daily. 02/25/15   Historical Provider, MD   BP 113/68 mmHg  Pulse 59  Temp(Src) 98.1 F (36.7 C) (Oral)  Resp 22  SpO2 100% Physical Exam  Constitutional: He is oriented to person, place, and time. He appears well-developed and well-nourished. No distress.  HENT:  Head: Normocephalic and atraumatic.  Moist mucous membranes  Eyes: Conjunctivae are normal. Pupils are equal, round, and reactive to light.  Neck: Neck supple.  Cardiovascular: Normal rate, regular rhythm and normal heart sounds.   No murmur heard. Pulmonary/Chest: Effort normal and breath sounds normal.  Abdominal: Soft. Bowel sounds are normal. There is no tenderness.  Mildly distended abdomen  Musculoskeletal: He exhibits no edema.  Neurological: He is alert and oriented to person, place, and time.  Fluent speech  Skin: Skin is warm and dry.   Psychiatric: He has a normal mood and affect. Judgment normal.  Nursing note and vitals reviewed.   ED Course  Procedures (including critical care time) Labs Review Labs Reviewed  COMPREHENSIVE METABOLIC PANEL - Abnormal; Notable for the following:    Chloride 90 (*)    CO2 37 (*)    Glucose, Bld 196 (*)    BUN 68 (*)    Creatinine, Ser 3.48 (*)    Albumin 3.1 (*)    AST 95 (*)    ALT 74 (*)    GFR calc non Af Amer 14 (*)    GFR calc Af Amer 16 (*)    All other components within normal limits  CBC WITH DIFFERENTIAL/PLATELET - Abnormal; Notable for the following:    RBC 3.93 (*)    MCV 102.3 (*)    MCH 34.6 (*)    All other components within normal limits  PROTIME-INR - Abnormal; Notable for the following:    Prothrombin Time 15.5 (*)    All other components within normal limits  I-STAT CG4 LACTIC ACID, ED - Abnormal; Notable for the following:    Lactic Acid, Venous 2.77 (*)    All other components within normal limits  LIPASE, BLOOD  URINALYSIS, ROUTINE W REFLEX MICROSCOPIC (NOT AT [facility])    Imaging Review Ct Abdomen Pelvis Wo Contrast  04/18/2015  EXAM: CT ABDOMEN AND PELVIS WITHOUT CONTRAST TECHNIQUE: Multidetector CT imaging of the abdomen and pelvis was performed following the standard protocol without IV contrast. COMPARISON:  Abdominal ultrasound 07/17/2014 FINDINGS: Small left pleural effusion and trace right effusion. No confluent opacity in the visualized lung bases. Heart is normal size. Liver appears small. No focal hepatic abnormality. Spleen, pancreas, right adrenal, kidneys have an unremarkable unenhanced appearance. 2.2 cm nodule in the left adrenal gland has a density of 35 Hounsfield units, nonspecific. No renal or ureteral stones. No hydronephrosis. There is prominence of the prostate. The inferior bladder wall is thickened, suggesting possible bladder outlet obstruction. Aorta and iliac vessels are heavily calcified,  non aneurysmal. Small left inguinal hernia  containing fat. There is no free fluid, free air or adenopathy. Stomach, large and small bowel are unremarkable. IMPRESSION: No renal or ureteral stones.  No hydronephrosis. Prominence of the prostate with inferior bladder wall thickening, possibly related to bladder outlet obstruction. Left inguinal hernia containing fat. Small left pleural effusion and trace right pleural effusion. Electronically Signed   By: Rolm Baptise M.D.   On: 04/18/2015 11:21   I have personally reviewed and evaluated these lab results as part of my medical decision-making.   EKG Interpretation None     Medications  milk and molasses enema (not administered)  sodium chloride 0.9 % bolus 500 mL (500 mLs Intravenous New Bag/Given 04/18/15 0911)    MDM   Final diagnoses:  Lactic acidosis  Urinary retention   patient with extensive medical history who presents with several days of constipation and associated with abdominal pain only when trying to have bowel movement as well as urinary retention. Patient well-appearing with reassuring vital signs at presentation. Abdomen was mildly distended but no abdominal tenderness. Bedside ultrasound showed approximately 450 ML's in bladder. Obtained above labs which were notable for lactate 2.77, Cr. 3.48 which is mildly elevated from baseline. Gave pt 542m NS given lactic acidosis and obtained CT abd/pelvis. CT showed mild bladder outlet obstruction but no other abnormalities to explain the patient's lactate. Patient does state that he went to his cardiologist 3 days ago and was told that he may be mildly dehydrated.    Foley catheter placed after patient unable to void and over 600 ML's obtained. Repeat lactate was abnormal at 3.2 which is higher than previous. Patient continues to deny any abdominal pain although I'm concerned about his climbing lactate and tenuous volume status. Discussed admission with Dr. MMarily Memosand patient admitted for further workup of his lactic  acidosis.  RSharlett Iles MD 04/18/15 [phone]

## 2015-04-18 NOTE — ED Notes (Signed)
Pt to ER from home via [facility] with complaint of abdominal pain in the umbilicus area x3 days. Denies nausea, vomiting and diarrhea. Pt reports urinary retention and constipation. VSS per [facility]. BP 140/74, CBG 179.

## 2015-04-18 NOTE — Progress Notes (Signed)
Pt family placed all 4 side rails up. Arthor Captain LPN

## 2015-04-18 NOTE — H&P (Signed)
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 Ongoing for the previous 3-4 days. MiraLAX with some improvement constipation. Patient states that at baseline he has 1-2 soft bowel movements per day. Abdominal discomfort is worsened when straining to use the bathroom. Urinary volume is at a minimal when patient voids and having to void more frequently. Denies any dysuria, fevers, diarrhea, rash, nausea, vomiting, loss of appetite. Symptoms are intermittent and getting worse. States he feels dehydrated as he recently cut back on fluid intake. Drinks no more than 1.5L of free water in a day.   Review of Systems:  Constitutional:  No weight loss, night sweats, Fevers, chills. HEENT:  No headaches, Difficulty swallowing,Tooth/dental problems,Sore throat, Cardio-vascular:  No chest pain, Orthopnea, dizziness, palpitations  GI:  No heartburn, indigestion, nausea, vomiting, diarrhea, loss of appetite  Resp:   No SOB, No excess mucus, no productive cough, No non-productive cough, No coughing up of blood.No change in color of mucus.No wheezing.No chest wall deformity  Skin:  no rash or lesions.  GU: Per HPI Musculoskeletal:   No joint pain or swelling. No decreased range of motion. No back pain.  Psych:  No chage in mood or affect. No depression or anxiety. Hard of hearing Neuro:  No change in sensation, unilateral strength, or cognitive abilities  All other systems were reviewed and are negative.  Past Medical History  Diagnosis Date  . Hyperlipidemia   . Spinal stenosis   . Anemia   . Carotid artery disease ([place])     a. s/p prior carotid  endarterectomy.  [name] PVD (peripheral vascular disease) ([place])   . Hyponatremia     Chronic  . CVA (cerebral infarction)     a. When asked to clarify patient says he was told thumb numbness may be TIA. He does not recall formal stroke. CT head results are only available through [facility], not in [facility].  . CKD (chronic kidney disease) stage 4, GFR 15-29 ml/min (HCC)   . Long term (current) use of anticoagulants   . PAF (paroxysmal atrial fibrillation) ([place])     a. Asymptomatic. On Eliquis. CHADSVASC 7.  . Sick sinus syndrome ([place])     With DDD St. Jude PM, initially placed in 1993 by Dr. Nils Pyle. Device upgrade 10/2002 to DDD, by Dr. Rollene Fare complicated by bleeding. Subsequent gen change 2011.  [name] Hypertensive cardiovascular disease   . Chronic diastolic congestive heart failure ([place])     ECHO 05/20/12 LVEF estimated by 2D at 55-60%  . Arthritis   . Gait disorder   . Diabetic peripheral neuropathy associated with type 2 diabetes mellitus ([place])   . Diabetes mellitus (HCC)     insulin dependent  . Elevated troponin I level 06/22/2014    a. 06/2014 felt due to demand ischemia.  . Chronic anticoagulation     For PAF, on Eliquis   . Urinary retention   . Pulmonary hypertension ([place])   . Foot drop, left 07/21/2014  . Asterixis 07/21/2014  . Hypertension   . Presence of permanent cardiac pacemaker   . Pneumonia     hx of pneumonia x 1    Past Surgical History  Procedure Laterality Date  .  Lumbar laminectomy  1995  . Back surgery    . Total knee arthroplasty Left   . Quadriceps tendon repair    . Cataract extraction    . Carotid endarterectomy    . Yag laser application Right 08/20/8117    Procedure: YAG LASER CAPSULOTOMY OF RIGHT EYE;  Surgeon: [name], MD;  Location: [place];  Service: Ophthalmology;  Laterality: Right;  . Tonsillectomy    . Pacemaker insertion      DDD St. Jude PM, initially placed in 1993 by Dr. Nils Pyle. Device upgrade 10/2002 to DDD, by Dr. Rollene Fare complicated by  bleeding.  . Carotid endarterectomy Right 2006  . Transurethral resection of prostate    . Cardioversion N/A 06/16/2013    Procedure: CARDIOVERSION BEDSIDE;  Surgeon: [name], MD;  Location: [facility];  Service: Cardiovascular;  Laterality: N/A;  . Pacemaker generator change  12/09/2009    SJM Accent DR RF gen change by Dr [name]  . Insert / replace / remove pacemaker      2004   . Transurethral resection of prostate N/A 08/27/2014    Procedure: TRANSURETHRAL RESECTION OF THE PROSTATE WITH GYRUS INSTRUMENTS;  Surgeon: [name], MD;  Location: [facility];  Service: Urology;  Laterality: N/A;  . Cystoscopy N/A 08/27/2014    Procedure: CYSTOSCOPY;  Surgeon: [name], MD;  Location: [facility];  Service: Urology;  Laterality: N/A;   Social History:  reports that he quit smoking about 56 years ago. His smoking use included Pipe and Cigarettes. He quit after 2 years of use. He has never used smokeless tobacco. He reports that he does not drink alcohol or use illicit drugs.  Allergies  Allergen Reactions  . Statins Other (See Comments)    rhabdomyolisis  . Aggrenox [Aspirin-Dipyridamole Er] Other (See Comments)    Dizziness  . Carvedilol Other (See Comments)    Dizziness  . Claritin [Loratadine] Other (See Comments)    Dizziness & drowsiness   . Cymbalta [Duloxetine Hcl]     Confusion   . Lyrica [Pregabalin] Other (See Comments)    Makes him dizzy. Takes him days to recover.  . Mucinex [Guaifenesin Er] Other (See Comments)    Dizziness, drowsiness  . Other Other (See Comments)    Preservatives in glaucoma gtts cause eye irritation, has to use preservative free  . Phenergan [Promethazine Hcl] Other (See Comments)    Causes severe confusion  . Plavix [Clopidogrel Bisulfate] Other (See Comments)    Dizziness  . Rapaflo [Silodosin] Other (See Comments)    Dizziness for about 30- 45  Minutes At time of preop appointment on 08/26/2014 patient denies any problems with this medication  .  Travatan Z [Travoprost] Other (See Comments)    Eye irritation    Family History  Problem Relation Age of Onset  . Multiple myeloma Father   . Hypertension Mother   . Healthy Sister   . COPD Sister   . Heart failure Sister      Prior to Admission medications   Medication Sig Start Date End Date Taking? Authorizing Provider  allopurinol (ZYLOPRIM) 100 MG tablet Take 100 mg by mouth 2 (two) times daily.    Yes Historical Provider, MD  amiodarone (PACERONE) 200 MG tablet Take 1 tablet (200 mg total) by mouth at bedtime. 09/17/14  Yes Belva Crome, MD  apixaban (ELIQUIS) 2.5 MG TABS tablet Take 1 tablet (2.5 mg total) by mouth 2 (two) times daily. 12/27/14  Yes Lynnell Dike  [name], MD  Carboxymethylcellul-Glycerin 0.5-0.9 % SOLN Place 1 drop into both eyes 2 (two) times daily.    Yes Historical Provider, MD  ferrous sulfate 325 (65 FE) MG tablet Take 325 mg by mouth every other day.   Yes Historical Provider, MD  insulin aspart (NOVOLOG FLEXPEN) 100 UNIT/ML FlexPen Inject 9 Units into the skin 3 (three) times daily with meals. Per sliding scale:  CBG <175 no insulin (unless consuming large amounts of carbs; 6-10 units for CBG >175 based on actual CBG and number of carbs in meal; >325 call MD   Yes Historical Provider, MD  insulin glargine (LANTUS) 100 UNIT/ML injection Inject 9 Units into the skin at bedtime. Or as directed by sliding scale.   Yes Historical Provider, MD  isosorbide mononitrate (IMDUR) 60 MG 24 hr tablet Take 1 tablet (60 mg total) by mouth daily. 03/07/15  Yes Belva Crome, MD  metolazone (ZAROXOLYN) 2.5 MG tablet Take 2.5 mg by mouth every 7 (seven) days. Take one (1) tablet (2.5 mg total) by mouth 30 minutes before Torsemide on Wednesdays   Yes Historical Provider, MD  metoprolol tartrate (LOPRESSOR) 25 MG tablet Take 1 tablet (25 mg total) by mouth daily. 02/28/15  Yes Belva Crome, MD  OVER THE COUNTER MEDICATION Take 1 packet by mouth daily. Nature Made Diabetic Pak (6 tablet  combo)   Yes Historical Provider, MD  polyethylene glycol (MIRALAX / GLYCOLAX) packet Take 17 g by mouth 2 (two) times daily.    Yes Historical Provider, MD  potassium chloride SA (K-DUR,KLOR-CON) 20 MEQ tablet Take 1 tablet (20 mEq total) by mouth daily. 09/08/14  Yes Debbe Odea, MD  Tafluprost 0.0015 % SOLN Place 1 drop into both eyes at bedtime. ZIOPTAN   Yes Historical Provider, MD  torsemide (DEMADEX) 20 MG tablet Take 80 mg by mouth daily. 02/25/15  Yes Historical Provider, MD  nitroGLYCERIN (NITROSTAT) 0.4 MG SL tablet Place 1 tablet (0.4 mg total) under the tongue every 5 (five) minutes as needed for chest pain. 12/03/13   Belva Crome, MD   Physical Exam: Filed Vitals:   04/18/15 1600 04/18/15 1615 04/18/15 1630 04/18/15 1700  BP: 136/71 138/80 120/65   Pulse: 67     Temp:      TempSrc:      Resp: 12 17    Weight:    78.8 kg (173 lb 11.6 oz)  SpO2: 100%       Wt Readings from Last 3 Encounters:  04/18/15 78.8 kg (173 lb 11.6 oz)  03/01/15 83.5 kg (184 lb 1.4 oz)  02/10/15 80.65 kg (177 lb 12.8 oz)    General:  Appears calm and comfortable Eyes:  PERRL, EOMI, normal lids, iris ENT:  grossly normal hearing, lips & tongue Neck:  no LAD, masses or thyromegaly Cardiovascular:  RRR, II/VI systolic murmur.  trace LE edema.  Respiratory:  CTA bilaterally, no w/r/r. Normal respiratory effort. Abdomen:  soft, ntnd Skin:  no rash or induration seen on limited exam Musculoskeletal:  grossly normal tone BUE/BLE Psychiatric:  grossly normal mood and affect, speech fluent and appropriate Neurologic:  CN 2-12 grossly intact, moves all extremities in coordinated fashion.          Labs on Admission:  Basic Metabolic Panel:  Recent Labs Lab 04/15/15 1121 04/18/15 0837  NA 134* 137  K 3.3* 3.9  CL 88* 90*  CO2 35* 37*  GLUCOSE 291* 196*  BUN 65* 68*  CREATININE 3.45* 3.48*  CALCIUM  9.6 10.2   Liver Function Tests:  Recent Labs Lab 04/18/15 0837  AST 95*  ALT 74*    ALKPHOS 56  BILITOT 0.6  PROT 6.8  ALBUMIN 3.1*    Recent Labs Lab 04/18/15 0837  LIPASE 26   No results for input(s): AMMONIA in the last 168 hours. CBC:  Recent Labs Lab 04/15/15 1121 04/18/15 0837  WBC 6.0 7.6  NEUTROABS 4.1 4.1  HGB 12.2* 13.6  HCT 38.1* 40.2  MCV 104.1* 102.3*  PLT 183 162   Cardiac Enzymes: No results for input(s): CKTOTAL, CKMB, CKMBINDEX, TROPONINI in the last 168 hours.  BNP (last 3 results)  Recent Labs  09/04/14 1439 10/04/14 1241 02/27/15 0924  BNP 1383.6* 467.9* 325.4*    ProBNP (last 3 results)  Recent Labs  07/16/14 1213  PROBNP 460.0*     CREATININE: 3.48 mg/dL ABNORMAL (04/18/15 0837) Estimated creatinine clearance - 12.6 mL/min  CBG: No results for input(s): GLUCAP in the last 168 hours.  Radiological Exams on Admission: Ct Abdomen Pelvis Wo Contrast  04/18/2015  EXAM: CT ABDOMEN AND PELVIS WITHOUT CONTRAST TECHNIQUE: Multidetector CT imaging of the abdomen and pelvis was performed following the standard protocol without IV contrast. COMPARISON:  Abdominal ultrasound 07/17/2014 FINDINGS: Small left pleural effusion and trace right effusion. No confluent opacity in the visualized lung bases. Heart is normal size. Liver appears small. No focal hepatic abnormality. Spleen, pancreas, right adrenal, kidneys have an unremarkable unenhanced appearance. 2.2 cm nodule in the left adrenal gland has a density of 35 Hounsfield units, nonspecific. No renal or ureteral stones. No hydronephrosis. There is prominence of the prostate. The inferior bladder wall is thickened, suggesting possible bladder outlet obstruction. Aorta and iliac vessels are heavily calcified, non aneurysmal. Small left inguinal hernia containing fat. There is no free fluid, free air or adenopathy. Stomach, large and small bowel are unremarkable. IMPRESSION: No renal or ureteral stones.  No hydronephrosis. Prominence of the prostate with inferior bladder wall  thickening, possibly related to bladder outlet obstruction. Left inguinal hernia containing fat. Small left pleural effusion and trace right pleural effusion. Electronically Signed   By: Rolm Baptise M.D.   On: 04/18/2015 11:21     Assessment/Plan Principal Problem:   Lactic acidosis Active Problems:   Paroxysmal atrial fibrillation, DCCV 06/16/13   Pacemaker - St Jude   CKD (chronic kidney disease) stage 4, GFR 15-29 ml/min (HCC)   Essential hypertension   Urinary retention due to benign prostatic hyperplasia   AKI (acute kidney injury) ([place])   Chronic systolic CHF (congestive heart failure) (HCC)   Constipation   SSS (sick sinus syndrome) (HCC)  Lactic Acidosis: uptrending on admission. 3.24 on recheck (2.77 initially). Suspect AoCKD and dehydration given pts recent decrease in fluids in combination w/ diuresis. CT scan abd w/o evidence of acute process. WBC 7.6, AFVSS, Cr 3.48. 750cc bolus in ED. - Tele - Trend Lactic acid - IVF (gentle hydration) - CBC and BMET in am  AoCKD: Cr 3.48. Baseline 2.9. Suspect dehydration. Allow free water intake up to 2L - IVF - BMET in am  Constipation: suspect multifactorial - Miralax and Senokot - Enema in am if not improving  Acute Urinary Retention: PVR 650. H/o BPH s/p TURPx2. Foley placed in ED. Discussed case w/ Dr Junious Silk who agrees w/ treatment plan as below and will see pt after DC.  - Continue Foley to decompress bladder x 5 days - Urology outpt  - flomax 0.4  DM: last A1c 8.8 -  continue home lantus 9 units QHS - SSI  Pleural effusions: noted on CT. Asymptomatic.  - monitor for now.   Chronic systolic CHF: EF 58-30% per Echo 02/2015.  - Continue baseline torsemide despite AoCKD. Consider DC if not improving.  - Continue metolazone, bblocker, amiodarone, Imdur  HTN: normotensive - continue Imdur, metoprolol, Metolazone   PAF/SSS: rate controlled. Pacemaker in place.  - continue bblocker, and amiodarone,  Eliquis  Gout: - continue allopurinol   Code Status: FULL  DVT Prophylaxis: Eliquis Family Communication: Friend  Disposition Plan: Pending Improvement    Ociel Retherford J, MD Family Medicine [facility] [email or web address] Password [email or web address]

## 2015-04-18 NOTE — ED Notes (Signed)
Pt bladder scan completed by MD Little revealed 450 cc urine.

## 2015-04-18 NOTE — Telephone Encounter (Signed)
Pt care take aware of lab results with verbal understanding.

## 2015-04-19 ENCOUNTER — Other Ambulatory Visit (INDEPENDENT_AMBULATORY_CARE_PROVIDER_SITE_OTHER): Payer: Medicare Other

## 2015-04-19 DIAGNOSIS — E872 Acidosis: Secondary | ICD-10-CM

## 2015-04-19 DIAGNOSIS — E876 Hypokalemia: Secondary | ICD-10-CM | POA: Diagnosis not present

## 2015-04-19 DIAGNOSIS — I5022 Chronic systolic (congestive) heart failure: Secondary | ICD-10-CM

## 2015-04-19 LAB — GLUCOSE, CAPILLARY
Glucose-Capillary: 275 mg/dL — ABNORMAL HIGH (ref 65–99)
Glucose-Capillary: 318 mg/dL — ABNORMAL HIGH (ref 65–99)

## 2015-04-19 LAB — CBC
HEMATOCRIT: 34.7 % — AB (ref 39.0–52.0)
HEMOGLOBIN: 11.6 g/dL — AB (ref 13.0–17.0)
MCH: 34.2 pg — ABNORMAL HIGH (ref 26.0–34.0)
MCHC: 33.4 g/dL (ref 30.0–36.0)
MCV: 102.4 fL — ABNORMAL HIGH (ref 78.0–100.0)
Platelets: 144 10*3/uL — ABNORMAL LOW (ref 150–400)
RBC: 3.39 MIL/uL — AB (ref 4.22–5.81)
RDW: 15.7 % — ABNORMAL HIGH (ref 11.5–15.5)
WBC: 6.2 10*3/uL (ref 4.0–10.5)

## 2015-04-19 LAB — COMPREHENSIVE METABOLIC PANEL
ALBUMIN: 2.9 g/dL — AB (ref 3.5–5.0)
ALK PHOS: 56 U/L (ref 38–126)
ALT: 71 U/L — AB (ref 17–63)
AST: 100 U/L — ABNORMAL HIGH (ref 15–41)
Anion gap: 10 (ref 5–15)
BILIRUBIN TOTAL: 1 mg/dL (ref 0.3–1.2)
BUN: 65 mg/dL — AB (ref 6–20)
CALCIUM: 9.4 mg/dL (ref 8.9–10.3)
CO2: 30 mmol/L (ref 22–32)
CREATININE: 3.22 mg/dL — AB (ref 0.61–1.24)
Chloride: 93 mmol/L — ABNORMAL LOW (ref 101–111)
GFR calc Af Amer: 18 mL/min — ABNORMAL LOW (ref >60)
GFR calc non Af Amer: 15 mL/min — ABNORMAL LOW (ref >60)
GLUCOSE: 271 mg/dL — AB (ref 65–99)
Potassium: 3.7 mmol/L (ref 3.5–5.1)
SODIUM: 133 mmol/L — AB (ref 135–145)
Total Protein: 6.2 g/dL — ABNORMAL LOW (ref 6.5–8.1)

## 2015-04-19 MED ORDER — TAMSULOSIN HCL 0.4 MG PO CAPS: 0.4000 mg | ORAL_CAPSULE | Freq: Every day | ORAL | Status: AC

## 2015-04-19 NOTE — Discharge Summary (Signed)
Physician Discharge Summary  [name], MD AY:[id number] DOB: 01/12/1919 DOA: 04/18/2015  PCP: [name], MD  Admit date: 04/18/2015 Discharge date: 04/19/2015  Recommendations for Outpatient Follow-up:  1. F/u with Dr. Junious Silk in 1 week, [facility] 2. Started on flomax and discharged with foley catheter 3. F/u with [name], Cardiology, in 1-2 weeks for routine heart failure visit.   4. Advised increasing miralax   Discharge Diagnoses:  Principal Problem:   Lactic acidosis Active Problems:   Paroxysmal atrial fibrillation, DCCV 06/16/13   Pacemaker - [employer]   CKD (chronic kidney disease) stage 4, GFR 15-29 ml/min (HCC)   Essential hypertension   Urinary retention due to benign prostatic hyperplasia   AKI (acute kidney injury) ([place])   Chronic systolic CHF (congestive heart failure) (HCC)   Constipation   SSS (sick sinus syndrome) ([place])   Discharge Condition: stable, improved  Diet recommendation: low sodium 2000mg  or less, diabetic diet  Wt Readings from Last 3 Encounters:  04/19/15 79.833 kg (176 lb)  03/01/15 83.5 kg (184 lb 1.4 oz)  02/10/15 80.65 kg (177 lb 12.8 oz)    History of present illness:   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, CKD stage 4, PAF, CVA, hypertension who presented with constipation and urinary retention associated with lower abdominal/pelvic discomfort for 3-4 days prior to admission.  He took increased MiraLAX with some improvement constipation, however, he became more constipated which led to urinary retention.  He had recently cut back to 1.5L of free water.  In the ER, he had a mild elevated of lactic acid and creatinine and therefore was admitted to the hospital.  Foley catheter was placed with 661mL of urine removed and he had a BM in the emergency department.    Hospital Course:   Acute Urinary Retention: PVR 650.  H/o BPH s/p TURPx2. Foley placed in ED. Discussed case w/ Dr Junious Silk who recommended flomax, continuing foley for 5 days and follow up with urology.  His probably has BPH and becomes more obstructed when he becomes constipated.  Constipation, CT scan abd w/o evidence of acute process.  This is likely medication side effect and fluid restriction.  He was advised to increase his miralax from BID to TID every day or every other day to prevent constipation.    AoCKD stage IV: Cr 3.48. Baseline 2.9 probably secondary to obstruction.  Foley catheter was placed and creatinine trended down.   Lactic Acidosis, likely due to mild dehydration and resolved quickly with IVF.  No evidence of sepsis.    DM type 2: last A1c 8.8, continued home medications at discharge.    Pleural effusions: noted on CT. Asymptomatic. Contni - monitor for now.   Chronic systolic CHF: EF 123456 per Echo 02/2015, pleural effusions noted on CT likely secondary to urinary obstruction.  Discussed case with [name] who recommended continuing previous diuretic regimen.   - Continue baseline torsemide despite AoCKD. Consider DC if not improving.  - Continue metolazone, bblocker, amiodarone, Imdur  HTN: normotensive, continued Imdur, metoprolol, Metolazone  PAF/SSS: rate controlled. Pacemaker in place. Continued bblocker, and amiodarone, and Eliquis.  Eliquis is not studied in patients with CKD stage IV, however, patient is aware of risks and declines warfarin.    Gout, stable, continued allopurinol  Bilateral plantar fasciitis, resolving.  Mild hyponatremia due to heart failure and urinary obstruction.   Mild elevation  of LFTs, chronic.  Defer to PCP  Procedures:  CT scan abd and pelvis  Consultations:  Urology, Dr. Junious Silk by phone  Cardiology, [name], informal question, not formal consult  Discharge Exam: Filed Vitals:   04/19/15 0743 04/19/15 1236  BP: 111/59 120/48  Pulse: 64 62  Temp:  98.6 F (37 C) 98 F (36.7 C)  Resp: 18 18   Filed Vitals:   04/18/15 2348 04/19/15 0430 04/19/15 0743 04/19/15 1236  BP: 115/57 124/59 111/59 120/48  Pulse: 59 62 64 62  Temp: 97.5 F (36.4 C) 98.2 F (36.8 C) 98.6 F (37 C) 98 F (36.7 C)  TempSrc: Oral Oral Oral Oral  Resp: 17 15 18 18   Height:      Weight:  79.833 kg (176 lb)    SpO2: 100% 100% 97% 100%    General: adult male, NAD Cardiovascular: RRR, S3 gallop, no rubs  Respiratory: Diminished at the bases bilaterally, but no rales or rhonchi or wheeze, no increased WOB ABD:  NABS, soft, NT/ND (back brace removed and replaced after exam) MSK:  Trace bilateral lower extremity edema, decreased muscle bulk, normal tone  Discharge Instructions      Discharge Instructions    (HEART FAILURE PATIENTS) Call MD:  Anytime you have any of the following symptoms: 1) 3 pound weight gain in 24 hours or 5 pounds in 1 week 2) shortness of breath, with or without a dry hacking cough 3) swelling in the hands, feet or stomach 4) if you have to sleep on extra pillows at night in order to breathe.    Complete by:  As directed      Call MD for:  difficulty breathing, headache or visual disturbances    Complete by:  As directed      Call MD for:  extreme fatigue    Complete by:  As directed      Call MD for:  hives    Complete by:  As directed      Call MD for:  persistant dizziness or light-headedness    Complete by:  As directed      Call MD for:  persistant nausea and vomiting    Complete by:  As directed      Call MD for:  severe uncontrolled pain    Complete by:  As directed      Call MD for:  temperature >100.4    Complete by:  As directed      Diet - low sodium heart healthy    Complete by:  As directed      Diet Carb Modified    Complete by:  As directed      Discharge instructions    Complete by:  As directed   Please leave urinary catheter in until you follow up with Dr. Junious Silk from [facility] in about 5 days.   Please take flomax (tamsulosin) nightly before bed.  For constipation, consider increasing your miralax to three times a day every day or alternating two times a day one day and three times a day the next day to prevent constipation.  Please continue all of your other medications as before.     Increase activity slowly    Complete by:  As directed             Medication List    TAKE these medications        allopurinol 100 MG tablet  Commonly known as:  ZYLOPRIM  Take 100  mg by mouth 2 (two) times daily.     amiodarone 200 MG tablet  Commonly known as:  PACERONE  Take 1 tablet (200 mg total) by mouth at bedtime.     apixaban 2.5 MG Tabs tablet  Commonly known as:  ELIQUIS  Take 1 tablet (2.5 mg total) by mouth 2 (two) times daily.     Carboxymethylcellul-Glycerin 0.5-0.9 % Soln  Place 1 drop into both eyes 2 (two) times daily.     ferrous sulfate 325 (65 FE) MG tablet  Take 325 mg by mouth every other day.     insulin glargine 100 UNIT/ML injection  Commonly known as:  LANTUS  Inject 9 Units into the skin at bedtime. Or as directed by sliding scale.     isosorbide mononitrate 60 MG 24 hr tablet  Commonly known as:  IMDUR  Take 1 tablet (60 mg total) by mouth daily.     metolazone 2.5 MG tablet  Commonly known as:  ZAROXOLYN  Take 2.5 mg by mouth every 7 (seven) days. Take one (1) tablet (2.5 mg total) by mouth 30 minutes before Torsemide on Wednesdays     metoprolol tartrate 25 MG tablet  Commonly known as:  LOPRESSOR  Take 1 tablet (25 mg total) by mouth daily.     nitroGLYCERIN 0.4 MG SL tablet  Commonly known as:  NITROSTAT  Place 1 tablet (0.4 mg total) under the tongue every 5 (five) minutes as needed for chest pain.     NOVOLOG FLEXPEN 100 UNIT/ML FlexPen  Generic drug:  insulin aspart  Inject 9 Units into the skin 3 (three) times daily with meals. Per sliding scale:  CBG <175 no insulin (unless consuming large amounts of carbs; 6-10 units for CBG >175 based  on actual CBG and number of carbs in meal; >325 call MD     OVER THE COUNTER MEDICATION  Take 1 packet by mouth daily. Nature Made Diabetic Pak (6 tablet combo)     polyethylene glycol packet  Commonly known as:  MIRALAX / GLYCOLAX  Take 17 g by mouth 2 (two) times daily.     potassium chloride SA 20 MEQ tablet  Commonly known as:  K-DUR,KLOR-CON  Take 1 tablet (20 mEq total) by mouth daily.     Tafluprost 0.0015 % Soln  Place 1 drop into both eyes at bedtime. ZIOPTAN     tamsulosin 0.4 MG Caps capsule  Commonly known as:  FLOMAX  Take 1 capsule (0.4 mg total) by mouth daily after supper.     torsemide 20 MG tablet  Commonly known as:  DEMADEX  Take 80 mg by mouth daily.       Follow-up Information    Follow up with [name], MD. Schedule an appointment as soon as possible for a visit in 1 month.   Specialty:  Internal Medicine   Contact information:   [address] [place] [address] [phone]       Follow up with [name], MD In 10 days.   Specialty:  Cardiology   Contact information:   [id number] [place] [address] [phone]       Follow up with [facility], MATTHEW, MD. Schedule an appointment as soon as possible for a visit in 5 days.   Specialty:  Urology   Contact information:   [place] [address] [phone]        The results of significant diagnostics from this  hospitalization (including imaging, microbiology, ancillary and laboratory) are listed below for reference.    Significant Diagnostic Studies: Ct Abdomen Pelvis Wo Contrast  04/18/2015  EXAM: CT ABDOMEN AND PELVIS WITHOUT CONTRAST TECHNIQUE: Multidetector CT imaging of the abdomen and pelvis was performed following the standard protocol without IV contrast. COMPARISON:  Abdominal ultrasound 07/17/2014 FINDINGS: Small left pleural effusion and trace right effusion. No confluent opacity in the visualized lung bases.  Heart is normal size. Liver appears small. No focal hepatic abnormality. Spleen, pancreas, right adrenal, kidneys have an unremarkable unenhanced appearance. 2.2 cm nodule in the left adrenal gland has a density of 35 Hounsfield units, nonspecific. No renal or ureteral stones. No hydronephrosis. There is prominence of the prostate. The inferior bladder wall is thickened, suggesting possible bladder outlet obstruction. Aorta and iliac vessels are heavily calcified, non aneurysmal. Small left inguinal hernia containing fat. There is no free fluid, free air or adenopathy. Stomach, large and small bowel are unremarkable. IMPRESSION: No renal or ureteral stones.  No hydronephrosis. Prominence of the prostate with inferior bladder wall thickening, possibly related to bladder outlet obstruction. Left inguinal hernia containing fat. Small left pleural effusion and trace right pleural effusion. Electronically Signed   By: Rolm Baptise M.D.   On: 04/18/2015 11:21    Microbiology: No results found for this or any previous visit (from the past 240 hour(s)).   Labs: Basic Metabolic Panel:  Recent Labs Lab 04/15/15 1121 04/18/15 0837 04/19/15 0535  NA 134* 137 133*  K 3.3* 3.9 3.7  CL 88* 90* 93*  CO2 35* 37* 30  GLUCOSE 291* 196* 271*  BUN 65* 68* 65*  CREATININE 3.45* 3.48* 3.22*  CALCIUM 9.6 10.2 9.4   Liver Function Tests:  Recent Labs Lab 04/18/15 0837 04/19/15 0535  AST 95* 100*  ALT 74* 71*  ALKPHOS 56 56  BILITOT 0.6 1.0  PROT 6.8 6.2*  ALBUMIN 3.1* 2.9*    Recent Labs Lab 04/18/15 0837  LIPASE 26   No results for input(s): AMMONIA in the last 168 hours. CBC:  Recent Labs Lab 04/15/15 1121 04/18/15 0837 04/19/15 0535  WBC 6.0 7.6 6.2  NEUTROABS 4.1 4.1  --   HGB 12.2* 13.6 11.6*  HCT 38.1* 40.2 34.7*  MCV 104.1* 102.3* 102.4*  PLT 183 162 144*   Cardiac Enzymes: No results for input(s): CKTOTAL, CKMB, CKMBINDEX, TROPONINI in the last 168 hours. BNP: BNP (last 3  results)  Recent Labs  09/04/14 1439 10/04/14 1241 02/27/15 0924  BNP 1383.6* 467.9* 325.4*    ProBNP (last 3 results)  Recent Labs  07/16/14 1213  PROBNP 460.0*    CBG:  Recent Labs Lab 04/18/15 2121 04/19/15 0605 04/19/15 1116  GLUCAP 253* 275* 318*    Time coordinating discharge: 35 minutes  Signed:  Kiowa Peifer  [facility] 04/19/2015, 1:38 PM

## 2015-04-19 NOTE — Progress Notes (Signed)
Utilization review completed. Jaquavius Hudler, RN, BSN. 

## 2015-04-19 NOTE — Discharge Summary (Addendum)
Pt got discharged, discharge instructions provided and patient showed understanding to it, IV taken out,Telemonitor DC,pt left unit in wheelchair with all of the belongings and Foley catheter on accompanied with his caretaker

## 2015-04-19 NOTE — Progress Notes (Signed)
   This is not a formal consult rather a courtesy visit  His home diuretic regimen should be torsemide 80 mg daily and metolazone 2.5 mg once per week.  Daily weights at home.

## 2015-04-19 NOTE — Progress Notes (Signed)
CM talked to patient about DCP; patient lives at home with his caregiver, he states that he is still active, does not want any HHC or any DME at this time. CM will continue to follow for DC; Aneta Mins [phone]

## 2015-04-20 ENCOUNTER — Observation Stay (HOSPITAL_COMMUNITY)
Admission: EM | Admit: 2015-04-20 | Discharge: 2015-04-26 | Disposition: A | Discharge status: Skilled Nursing Facility | Payer: Medicare Other | Source: Home / Self Care | Attending: Emergency Medicine | Admitting: Emergency Medicine

## 2015-04-20 ENCOUNTER — Encounter (HOSPITAL_COMMUNITY): Payer: Self-pay

## 2015-04-20 ENCOUNTER — Emergency Department (HOSPITAL_COMMUNITY): Payer: Medicare Other

## 2015-04-20 DIAGNOSIS — I5022 Chronic systolic (congestive) heart failure: Secondary | ICD-10-CM | POA: Diagnosis present

## 2015-04-20 DIAGNOSIS — I10 Essential (primary) hypertension: Secondary | ICD-10-CM | POA: Diagnosis not present

## 2015-04-20 DIAGNOSIS — Z8673 Personal history of transient ischemic attack (TIA), and cerebral infarction without residual deficits: Secondary | ICD-10-CM

## 2015-04-20 DIAGNOSIS — J189 Pneumonia, unspecified organism: Secondary | ICD-10-CM | POA: Diagnosis present

## 2015-04-20 DIAGNOSIS — M169 Osteoarthritis of hip, unspecified: Secondary | ICD-10-CM | POA: Diagnosis present

## 2015-04-20 DIAGNOSIS — M4800 Spinal stenosis, site unspecified: Secondary | ICD-10-CM | POA: Diagnosis present

## 2015-04-20 DIAGNOSIS — I248 Other forms of acute ischemic heart disease: Secondary | ICD-10-CM | POA: Diagnosis present

## 2015-04-20 DIAGNOSIS — R338 Other retention of urine: Secondary | ICD-10-CM

## 2015-04-20 DIAGNOSIS — I5042 Chronic combined systolic (congestive) and diastolic (congestive) heart failure: Secondary | ICD-10-CM | POA: Diagnosis present

## 2015-04-20 DIAGNOSIS — Z87891 Personal history of nicotine dependence: Secondary | ICD-10-CM

## 2015-04-20 DIAGNOSIS — R269 Unspecified abnormalities of gait and mobility: Secondary | ICD-10-CM | POA: Diagnosis present

## 2015-04-20 DIAGNOSIS — M545 Low back pain, unspecified: Secondary | ICD-10-CM | POA: Insufficient documentation

## 2015-04-20 DIAGNOSIS — I5032 Chronic diastolic (congestive) heart failure: Secondary | ICD-10-CM | POA: Diagnosis present

## 2015-04-20 DIAGNOSIS — E1142 Type 2 diabetes mellitus with diabetic polyneuropathy: Secondary | ICD-10-CM | POA: Diagnosis present

## 2015-04-20 DIAGNOSIS — R69 Illness, unspecified: Secondary | ICD-10-CM | POA: Diagnosis present

## 2015-04-20 DIAGNOSIS — M549 Dorsalgia, unspecified: Secondary | ICD-10-CM | POA: Diagnosis present

## 2015-04-20 DIAGNOSIS — Y95 Nosocomial condition: Secondary | ICD-10-CM | POA: Diagnosis present

## 2015-04-20 DIAGNOSIS — E1122 Type 2 diabetes mellitus with diabetic chronic kidney disease: Secondary | ICD-10-CM | POA: Diagnosis present

## 2015-04-20 DIAGNOSIS — E785 Hyperlipidemia, unspecified: Secondary | ICD-10-CM | POA: Diagnosis present

## 2015-04-20 DIAGNOSIS — N184 Chronic kidney disease, stage 4 (severe): Secondary | ICD-10-CM | POA: Diagnosis not present

## 2015-04-20 DIAGNOSIS — E869 Volume depletion, unspecified: Secondary | ICD-10-CM | POA: Diagnosis present

## 2015-04-20 DIAGNOSIS — I480 Paroxysmal atrial fibrillation: Secondary | ICD-10-CM | POA: Diagnosis present

## 2015-04-20 DIAGNOSIS — M1610 Unilateral primary osteoarthritis, unspecified hip: Secondary | ICD-10-CM

## 2015-04-20 DIAGNOSIS — Z794 Long term (current) use of insulin: Secondary | ICD-10-CM

## 2015-04-20 DIAGNOSIS — R627 Adult failure to thrive: Secondary | ICD-10-CM | POA: Diagnosis present

## 2015-04-20 DIAGNOSIS — M160 Bilateral primary osteoarthritis of hip: Principal | ICD-10-CM | POA: Diagnosis present

## 2015-04-20 DIAGNOSIS — I25119 Atherosclerotic heart disease of native coronary artery with unspecified angina pectoris: Secondary | ICD-10-CM | POA: Diagnosis present

## 2015-04-20 DIAGNOSIS — L8990 Pressure ulcer of unspecified site, unspecified stage: Secondary | ICD-10-CM | POA: Diagnosis not present

## 2015-04-20 DIAGNOSIS — I495 Sick sinus syndrome: Secondary | ICD-10-CM

## 2015-04-20 DIAGNOSIS — Z79899 Other long term (current) drug therapy: Secondary | ICD-10-CM

## 2015-04-20 DIAGNOSIS — N401 Enlarged prostate with lower urinary tract symptoms: Secondary | ICD-10-CM | POA: Diagnosis present

## 2015-04-20 DIAGNOSIS — Z950 Presence of cardiac pacemaker: Secondary | ICD-10-CM | POA: Diagnosis present

## 2015-04-20 DIAGNOSIS — Z96652 Presence of left artificial knee joint: Secondary | ICD-10-CM | POA: Diagnosis present

## 2015-04-20 DIAGNOSIS — N138 Other obstructive and reflux uropathy: Secondary | ICD-10-CM | POA: Diagnosis present

## 2015-04-20 DIAGNOSIS — M21372 Foot drop, left foot: Secondary | ICD-10-CM | POA: Diagnosis present

## 2015-04-20 DIAGNOSIS — I130 Hypertensive heart and chronic kidney disease with heart failure and stage 1 through stage 4 chronic kidney disease, or unspecified chronic kidney disease: Secondary | ICD-10-CM | POA: Diagnosis present

## 2015-04-20 DIAGNOSIS — D649 Anemia, unspecified: Secondary | ICD-10-CM | POA: Diagnosis present

## 2015-04-20 DIAGNOSIS — Z7901 Long term (current) use of anticoagulants: Secondary | ICD-10-CM

## 2015-04-20 DIAGNOSIS — I272 Other secondary pulmonary hypertension: Secondary | ICD-10-CM | POA: Diagnosis present

## 2015-04-20 DIAGNOSIS — E871 Hypo-osmolality and hyponatremia: Secondary | ICD-10-CM | POA: Diagnosis present

## 2015-04-20 DIAGNOSIS — M109 Gout, unspecified: Secondary | ICD-10-CM | POA: Diagnosis present

## 2015-04-20 DIAGNOSIS — R5381 Other malaise: Secondary | ICD-10-CM | POA: Diagnosis present

## 2015-04-20 LAB — BASIC METABOLIC PANEL
Anion gap: 12 (ref 5–15)
BUN: 59 mg/dL — AB (ref 6–20)
CALCIUM: 9.8 mg/dL (ref 8.9–10.3)
CHLORIDE: 92 mmol/L — AB (ref 101–111)
CO2: 31 mmol/L (ref 22–32)
CREATININE: 3.13 mg/dL — AB (ref 0.61–1.24)
GFR, EST AFRICAN AMERICAN: 18 mL/min — AB (ref >60)
GFR, EST NON AFRICAN AMERICAN: 16 mL/min — AB (ref >60)
Glucose, Bld: 210 mg/dL — ABNORMAL HIGH (ref 65–99)
Potassium: 4.1 mmol/L (ref 3.5–5.1)
SODIUM: 135 mmol/L (ref 135–145)

## 2015-04-20 LAB — URINALYSIS, ROUTINE W REFLEX MICROSCOPIC
Bilirubin Urine: NEGATIVE
GLUCOSE, UA: NEGATIVE mg/dL
Ketones, ur: NEGATIVE mg/dL
NITRITE: NEGATIVE
PROTEIN: NEGATIVE mg/dL
Specific Gravity, Urine: 1.013 (ref 1.005–1.030)
pH: 7.5 (ref 5.0–8.0)

## 2015-04-20 LAB — CBC WITH DIFFERENTIAL/PLATELET
BASOS ABS: 0 10*3/uL (ref 0.0–0.1)
BASOS PCT: 0 %
EOS PCT: 0 %
Eosinophils Absolute: 0 10*3/uL (ref 0.0–0.7)
HCT: 33.9 % — ABNORMAL LOW (ref 39.0–52.0)
Hemoglobin: 11.3 g/dL — ABNORMAL LOW (ref 13.0–17.0)
LYMPHS PCT: 16 %
Lymphs Abs: 1.6 10*3/uL (ref 0.7–4.0)
MCH: 34.1 pg — ABNORMAL HIGH (ref 26.0–34.0)
MCHC: 33.3 g/dL (ref 30.0–36.0)
MCV: 102.4 fL — AB (ref 78.0–100.0)
Monocytes Absolute: 1.1 10*3/uL — ABNORMAL HIGH (ref 0.1–1.0)
Monocytes Relative: 11 %
NEUTROS ABS: 7.6 10*3/uL (ref 1.7–7.7)
NEUTROS PCT: 73 %
PLATELETS: 141 10*3/uL — AB (ref 150–400)
RBC: 3.31 MIL/uL — AB (ref 4.22–5.81)
RDW: 15.6 % — ABNORMAL HIGH (ref 11.5–15.5)
WBC: 10.3 10*3/uL (ref 4.0–10.5)

## 2015-04-20 LAB — GLUCOSE, CAPILLARY
Glucose-Capillary: 251 mg/dL — ABNORMAL HIGH (ref 65–99)
Glucose-Capillary: 248 mg/dL — ABNORMAL HIGH (ref 65–99)

## 2015-04-20 LAB — URINE MICROSCOPIC-ADD ON
BACTERIA UA: NONE SEEN
SQUAMOUS EPITHELIAL / LPF: NONE SEEN

## 2015-04-20 LAB — CBG MONITORING, ED: GLUCOSE-CAPILLARY: 209 mg/dL — AB (ref 65–99)

## 2015-04-20 MED ORDER — MORPHINE SULFATE (PF) 2 MG/ML IV SOLN
2.0000 mg | Freq: Once | INTRAVENOUS | Status: AC
  Administered 2015-04-20: 2 mg via INTRAVENOUS
  Filled 2015-04-20: qty 1

## 2015-04-20 MED ORDER — POLYETHYLENE GLYCOL 3350 17 G PO PACK
17.0000 g | PACK | Freq: Two times a day (BID) | ORAL | Status: DC
  Administered 2015-04-20 – 2015-04-26 (×8): 17 g via ORAL
  Filled 2015-04-20 (×11): qty 1

## 2015-04-20 MED ORDER — SODIUM CHLORIDE 0.9 % IV SOLN
INTRAVENOUS | Status: DC
  Administered 2015-04-20: 16:00:00 via INTRAVENOUS

## 2015-04-20 MED ORDER — TORSEMIDE 20 MG PO TABS
80.0000 mg | ORAL_TABLET | Freq: Every day | ORAL | Status: DC
  Administered 2015-04-20 – 2015-04-25 (×6): 80 mg via ORAL
  Filled 2015-04-20 (×6): qty 4

## 2015-04-20 MED ORDER — ONDANSETRON HCL 4 MG PO TABS: 4.0000 mg | ORAL_TABLET | Freq: Four times a day (QID) | ORAL | Status: DC | PRN

## 2015-04-20 MED ORDER — NITROGLYCERIN 0.4 MG SL SUBL: 0.4000 mg | SUBLINGUAL_TABLET | SUBLINGUAL | Status: DC | PRN

## 2015-04-20 MED ORDER — ALLOPURINOL 100 MG PO TABS
100.0000 mg | ORAL_TABLET | Freq: Two times a day (BID) | ORAL | Status: DC
  Administered 2015-04-20 – 2015-04-26 (×12): 100 mg via ORAL
  Filled 2015-04-20 (×12): qty 1

## 2015-04-20 MED ORDER — APIXABAN 2.5 MG PO TABS
2.5000 mg | ORAL_TABLET | Freq: Two times a day (BID) | ORAL | Status: DC
  Administered 2015-04-20 – 2015-04-21 (×2): 2.5 mg via ORAL
  Filled 2015-04-20 (×2): qty 1

## 2015-04-20 MED ORDER — ISOSORBIDE MONONITRATE ER 60 MG PO TB24
60.0000 mg | ORAL_TABLET | Freq: Every day | ORAL | Status: DC
  Administered 2015-04-20 – 2015-04-26 (×7): 60 mg via ORAL
  Filled 2015-04-20 (×7): qty 1

## 2015-04-20 MED ORDER — METHOCARBAMOL 500 MG PO TABS
500.0000 mg | ORAL_TABLET | Freq: Three times a day (TID) | ORAL | Status: DC
  Administered 2015-04-20 – 2015-04-22 (×5): 500 mg via ORAL
  Filled 2015-04-20 (×5): qty 1

## 2015-04-20 MED ORDER — AMIODARONE HCL 200 MG PO TABS
200.0000 mg | ORAL_TABLET | Freq: Every day | ORAL | Status: DC
  Administered 2015-04-20 – 2015-04-25 (×6): 200 mg via ORAL
  Filled 2015-04-20 (×6): qty 1

## 2015-04-20 MED ORDER — CARBOXYMETHYLCELLUL-GLYCERIN 0.5-0.9 % OP SOLN: 1.0000 [drp] | Freq: Two times a day (BID) | OPHTHALMIC | Status: DC

## 2015-04-20 MED ORDER — TAMSULOSIN HCL 0.4 MG PO CAPS
0.4000 mg | ORAL_CAPSULE | Freq: Every day | ORAL | Status: DC
  Administered 2015-04-20 – 2015-04-25 (×6): 0.4 mg via ORAL
  Filled 2015-04-20 (×6): qty 1

## 2015-04-20 MED ORDER — LATANOPROST 0.005 % OP SOLN
1.0000 [drp] | Freq: Every day | OPHTHALMIC | Status: DC
  Administered 2015-04-20 – 2015-04-24 (×4): 1 [drp] via OPHTHALMIC
  Filled 2015-04-20: qty 2.5

## 2015-04-20 MED ORDER — POLYVINYL ALCOHOL 1.4 % OP SOLN
1.0000 [drp] | Freq: Two times a day (BID) | OPHTHALMIC | Status: DC
  Administered 2015-04-20 – 2015-04-24 (×4): 1 [drp] via OPHTHALMIC
  Filled 2015-04-20: qty 15

## 2015-04-20 MED ORDER — POTASSIUM CHLORIDE CRYS ER 20 MEQ PO TBCR
20.0000 meq | EXTENDED_RELEASE_TABLET | Freq: Every day | ORAL | Status: DC
  Administered 2015-04-21 – 2015-04-26 (×6): 20 meq via ORAL
  Filled 2015-04-20 (×7): qty 1

## 2015-04-20 MED ORDER — INSULIN ASPART 100 UNIT/ML ~~LOC~~ SOLN
0.0000 [IU] | Freq: Three times a day (TID) | SUBCUTANEOUS | Status: DC
  Administered 2015-04-20: 5 [IU] via SUBCUTANEOUS
  Administered 2015-04-21: 9 [IU] via SUBCUTANEOUS
  Administered 2015-04-21: 5 [IU] via SUBCUTANEOUS
  Administered 2015-04-21 – 2015-04-22 (×3): 3 [IU] via SUBCUTANEOUS
  Administered 2015-04-22: 5 [IU] via SUBCUTANEOUS
  Administered 2015-04-23: 7 [IU] via SUBCUTANEOUS
  Administered 2015-04-23 (×2): 2 [IU] via SUBCUTANEOUS
  Administered 2015-04-24: 1 [IU] via SUBCUTANEOUS
  Administered 2015-04-24: 2 [IU] via SUBCUTANEOUS
  Administered 2015-04-24: 3 [IU] via SUBCUTANEOUS
  Administered 2015-04-25: 2 [IU] via SUBCUTANEOUS
  Administered 2015-04-25 (×2): 5 [IU] via SUBCUTANEOUS
  Administered 2015-04-26: 7 [IU] via SUBCUTANEOUS
  Administered 2015-04-26: 3 [IU] via SUBCUTANEOUS

## 2015-04-20 MED ORDER — METOLAZONE 2.5 MG PO TABS
2.5000 mg | ORAL_TABLET | ORAL | Status: DC
  Administered 2015-04-20: 2.5 mg via ORAL
  Filled 2015-04-20: qty 1

## 2015-04-20 MED ORDER — HYDROCODONE-ACETAMINOPHEN 5-325 MG PO TABS
1.0000 | ORAL_TABLET | ORAL | Status: DC | PRN
  Administered 2015-04-21 (×2): 2 via ORAL
  Administered 2015-04-22 – 2015-04-23 (×2): 1 via ORAL
  Administered 2015-04-24: 2 via ORAL
  Administered 2015-04-25 – 2015-04-26 (×2): 1 via ORAL
  Filled 2015-04-20: qty 1
  Filled 2015-04-20 (×2): qty 2
  Filled 2015-04-20: qty 1
  Filled 2015-04-20 (×2): qty 2
  Filled 2015-04-20 (×2): qty 1

## 2015-04-20 MED ORDER — ACETAMINOPHEN 325 MG PO TABS: 650.0000 mg | ORAL_TABLET | Freq: Four times a day (QID) | ORAL | Status: DC | PRN

## 2015-04-20 MED ORDER — FERROUS SULFATE 325 (65 FE) MG PO TABS
325.0000 mg | ORAL_TABLET | ORAL | Status: DC
  Administered 2015-04-21 – 2015-04-25 (×3): 325 mg via ORAL
  Filled 2015-04-20 (×4): qty 1

## 2015-04-20 MED ORDER — ONDANSETRON HCL 4 MG/2ML IJ SOLN: 4.0000 mg | Freq: Four times a day (QID) | INTRAMUSCULAR | Status: DC | PRN

## 2015-04-20 MED ORDER — MORPHINE SULFATE (PF) 2 MG/ML IV SOLN: 1.0000 mg | INTRAVENOUS | Status: DC | PRN

## 2015-04-20 MED ORDER — ACETAMINOPHEN 650 MG RE SUPP: 650.0000 mg | Freq: Four times a day (QID) | RECTAL | Status: DC | PRN

## 2015-04-20 MED ORDER — METOPROLOL TARTRATE 25 MG PO TABS
25.0000 mg | ORAL_TABLET | Freq: Every day | ORAL | Status: DC
  Administered 2015-04-20 – 2015-04-26 (×7): 25 mg via ORAL
  Filled 2015-04-20 (×4): qty 1
  Filled 2015-04-20: qty 2
  Filled 2015-04-20 (×2): qty 1

## 2015-04-20 NOTE — ED Notes (Signed)
IV Team at the bedside. 

## 2015-04-20 NOTE — H&P (Signed)
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 He was discharged from here yesterday after admission for acute urinary retention / constipation, acute on chronic CKD.   His lactic acid level was elevated but not felt to be septic. Foley placed for urinary retention, flomax started. He is to follow up with Urology. As far as constipation, CTscan was negative for bowel abnormalities but did show prominence of the prostate with inferior bladder wall thickening, possibly bladder outlet obstruction.  Miralax increased to 2-3 times daily. Pleural effusions seen but patient was asymptomatic.  Patient brought back to the emergency department today for severe lower back pain radiating down both legs and associated ambulation problems. Patient does not usually take pain medications but just had some morphine . He is sleepy. Wife at bedside, provides history. Patient began complaining of back pain yesterday while still hospitalized. He indicated the pain started after positioning for x-ray (patient did have a CT scan 2 days ago ). Despite the pain patient wanted to go home, thought he would feel more comfortable there . Unfortunately the lower back pain became progressively worse with radiation down into both legs. He could not stand because of the pain as well as associated weakness in both legs . Patient could not find a comfortable position for sleep last night. Wife brought him back to the emergency department in the middle of the night. No loss of bowel or bladder function. Patient has chronic back pain but apparently he has not had this degree of pain  before. His last back surgery was approximately 20 years ago.  Lumbar spine CT scan today negative for any acute abnormalities.  ED COURSE:    Vital signs stable, temp 99.2 on arrival  Labs:   Wbc 10.3, hemoglobin 11.3, sodium 135, chloride 92, creatinine 3.13, glucose 210 AST 100, ALT 71  Urinalysis:    Clear, moderate leukocytes, negative nitrites, 0-5 WBCs    Medications  morphine 2 MG/ML injection 2 mg (2 mg Intravenous Given 04/20/15 0844)   Review of Systems  Unable to perform ROS: medical condition   Sleepy after Morphine. It is difficult for him to interact without grimacing and constantly trying to reposition himself.   Past Medical History  Diagnosis Date  . Hyperlipidemia   . Spinal stenosis   . Anemia   . Carotid artery disease ([place])     a. s/p prior carotid endarterectomy.  [name] PVD (peripheral vascular disease) ([place])   . Hyponatremia     Chronic  . CVA (cerebral infarction)     a. When asked to clarify patient says he was told thumb numbness may be TIA. He does not recall formal stroke. CT head results are only available through [facility], not in [facility].  . CKD (chronic kidney disease) stage 4, GFR 15-29 ml/min (HCC)   . Long term (current) use of anticoagulants   . PAF (paroxysmal atrial fibrillation) ([place])     a. Asymptomatic. On Eliquis. CHADSVASC 7.  . Sick sinus syndrome ([place])     With DDD St. Jude PM, initially placed in 1993 by Dr. Nils Pyle. Device upgrade 10/2002 to DDD, by Dr. Rollene Fare complicated by bleeding.  Subsequent gen change 2011.  [name] Hypertensive cardiovascular disease   . Chronic diastolic congestive heart failure ([place])     ECHO 05/20/12 LVEF estimated by 2D at 55-60%  . Arthritis   . Gait disorder   . Diabetic peripheral neuropathy associated with type 2 diabetes mellitus ([place])   . Diabetes mellitus (HCC)     insulin dependent  . Elevated troponin I level 06/22/2014    a. 06/2014 felt due to demand ischemia.  . Chronic anticoagulation     For PAF, on  Eliquis   . Urinary retention   . Pulmonary hypertension ([place])   . Foot drop, left 07/21/2014  . Asterixis 07/21/2014  . Hypertension   . Presence of permanent cardiac pacemaker   . Pneumonia     hx of pneumonia x 1   . Bilateral plantar fasciitis    Past Surgical History  Procedure Laterality Date  . Lumbar laminectomy  1995  . Back surgery    . Total knee arthroplasty Left   . Quadriceps tendon repair    . Cataract extraction    . Carotid endarterectomy    . Yag laser application Right 8/36/6294    Procedure: YAG LASER CAPSULOTOMY OF RIGHT EYE;  Surgeon: [name], MD;  Location: [facility];  Service: Ophthalmology;  Laterality: Right;  . Tonsillectomy    . Pacemaker insertion      DDD St. Jude PM, initially placed in 1993 by Dr. Nils Pyle. Device upgrade 10/2002 to DDD, by Dr. Rollene Fare complicated by bleeding.  . Carotid endarterectomy Right 2006  . Transurethral resection of prostate    . Cardioversion N/A 06/16/2013    Procedure: CARDIOVERSION BEDSIDE;  Surgeon: [name], MD;  Location: [facility];  Service: Cardiovascular;  Laterality: N/A;  . Pacemaker generator change  12/09/2009    SJM Accent DR RF gen change by Dr [name]  . Insert / replace / remove pacemaker      2004   . Transurethral resection of prostate N/A 08/27/2014    Procedure: TRANSURETHRAL RESECTION OF THE PROSTATE WITH GYRUS INSTRUMENTS;  Surgeon: [name], MD;  Location: [facility];  Service: Urology;  Laterality: N/A;  . Cystoscopy N/A 08/27/2014    Procedure: CYSTOSCOPY;  Surgeon: [name], MD;  Location: [facility];  Service: Urology;  Laterality: N/A;    SOCIAL HISTORY:  reports that he quit smoking about 56 years ago. His smoking use included Pipe and Cigarettes. He quit after 2 years of use. He has never used smokeless tobacco. He reports that he drinks alcohol. He reports that he does not use illicit drugs. Lives:   At home with family    Assistive devices:   Uses walker for ambulation.   Allergies    Allergen Reactions  . Statins Other (See Comments)    rhabdomyolisis  . Aggrenox [Aspirin-Dipyridamole Er] Other (See Comments)    Dizziness  . Carvedilol Other (See Comments)    Dizziness  . Claritin [Loratadine] Other (See Comments)    Dizziness & drowsiness   . Cymbalta [Duloxetine Hcl]     Confusion   . Lyrica [Pregabalin] Other (See Comments)    Makes him dizzy. Takes him days to recover.  . Mucinex [Guaifenesin Er] Other (See Comments)    Dizziness, drowsiness  . Other Other (See Comments)    Preservatives in glaucoma gtts cause eye irritation, has to use preservative free  . Phenergan [Promethazine Hcl] Other (See Comments)    Causes severe confusion  .  Plavix [Clopidogrel Bisulfate] Other (See Comments)    Dizziness  . Rapaflo [Silodosin] Other (See Comments)    Dizziness for about 30- 45  Minutes At time of preop appointment on 08/26/2014 patient denies any problems with this medication  . Travatan Z [Travoprost] Other (See Comments)    Eye irritation    Family History  Problem Relation Age of Onset  . Multiple myeloma Father   . Hypertension Mother   . Healthy Sister   . COPD Sister   . Heart failure Sister     Prior to Admission medications   Medication Sig Start Date End Date Taking? Authorizing Provider  allopurinol (ZYLOPRIM) 100 MG tablet Take 100 mg by mouth 2 (two) times daily.    Yes Historical Provider, MD  amiodarone (PACERONE) 200 MG tablet Take 1 tablet (200 mg total) by mouth at bedtime. 09/17/14  Yes Belva Crome, MD  apixaban (ELIQUIS) 2.5 MG TABS tablet Take 1 tablet (2.5 mg total) by mouth 2 (two) times daily. 12/27/14  Yes Belva Crome, MD  Carboxymethylcellul-Glycerin 0.5-0.9 % SOLN Place 1 drop into both eyes 2 (two) times daily.    Yes Historical Provider, MD  ferrous sulfate 325 (65 FE) MG tablet Take 325 mg by mouth every other day.   Yes Historical Provider, MD  insulin aspart (NOVOLOG FLEXPEN) 100 UNIT/ML FlexPen Inject 9 Units into the  skin 3 (three) times daily with meals. Per sliding scale:  CBG <175 no insulin (unless consuming large amounts of carbs; 6-10 units for CBG >175 based on actual CBG and number of carbs in meal; >325 call MD   Yes Historical Provider, MD  insulin glargine (LANTUS) 100 UNIT/ML injection Inject 9 Units into the skin at bedtime. Or as directed by sliding scale.   Yes Historical Provider, MD  isosorbide mononitrate (IMDUR) 60 MG 24 hr tablet Take 1 tablet (60 mg total) by mouth daily. 03/07/15  Yes Belva Crome, MD  metolazone (ZAROXOLYN) 2.5 MG tablet Take 2.5 mg by mouth every 7 (seven) days. Take one (1) tablet (2.5 mg total) by mouth 30 minutes before Torsemide on Wednesdays   Yes Historical Provider, MD  metoprolol tartrate (LOPRESSOR) 25 MG tablet Take 1 tablet (25 mg total) by mouth daily. 02/28/15  Yes Belva Crome, MD  nitroGLYCERIN (NITROSTAT) 0.4 MG SL tablet Place 1 tablet (0.4 mg total) under the tongue every 5 (five) minutes as needed for chest pain. 12/03/13  Yes Belva Crome, MD  OVER THE COUNTER MEDICATION Take 1 packet by mouth daily. Nature Made Diabetic Pak (6 tablet combo)   Yes Historical Provider, MD  polyethylene glycol (MIRALAX / GLYCOLAX) packet Take 17 g by mouth 2 (two) times daily.    Yes Historical Provider, MD  potassium chloride SA (K-DUR,KLOR-CON) 20 MEQ tablet Take 1 tablet (20 mEq total) by mouth daily. 09/08/14  Yes Debbe Odea, MD  Tafluprost 0.0015 % SOLN Place 1 drop into both eyes at bedtime. ZIOPTAN   Yes Historical Provider, MD  tamsulosin (FLOMAX) 0.4 MG CAPS capsule Take 1 capsule (0.4 mg total) by mouth daily after supper. 04/19/15  Yes Janece Canterbury, MD  torsemide (DEMADEX) 20 MG tablet Take 80 mg by mouth daily. 02/25/15  Yes Historical Provider, MD   PHYSICAL EXAM: Filed Vitals:   04/20/15 0915 04/20/15 0930 04/20/15 0945 04/20/15 1000  BP: 124/59 125/55 124/55 120/57  Pulse: 60 60 60 60  Temp:      TempSrc:  Resp: 18     Height:      Weight:       SpO2: 100% 100% 100% 99%    Wt Readings from Last 3 Encounters:  04/20/15 79.833 kg (176 lb)  04/19/15 79.833 kg (176 lb)  03/01/15 83.5 kg (184 lb 1.4 oz)    General:  Pleasant well developed black male. Appears calm and comfortable Eyes: PER, normal lids, irises & conjunctiva ENT: Hard of hearing. Grossly lips Neck: no LAD, no masses Cardiovascular: RRR, no murmurs. No LE edema.  Respiratory: Respirations even and unlabored. Normal respiratory effort. Lungs CTA bilaterally, no wheezes / rales .   Abdomen: soft, non-distended, non-tender, active bowel sounds. No obvious masses.  Skin: no rash seen on limited exam Musculoskeletal: Unreliable exam (sleepy on Morphine) but good sensation in BLE. He has decrease strength in BLE.  Psychiatric: sleepy Neurologic: grossly non-focal.         LABS ON ADMISSION:    Basic Metabolic Panel:  Recent Labs Lab 04/15/15 1121 04/18/15 0837 04/19/15 0535 04/20/15 0838  NA 134* 137 133* 135  K 3.3* 3.9 3.7 4.1  CL 88* 90* 93* 92*  CO2 35* 37* 30 31  GLUCOSE 291* 196* 271* 210*  BUN 65* 68* 65* 59*  CREATININE 3.45* 3.48* 3.22* 3.13*  CALCIUM 9.6 10.2 9.4 9.8   Liver Function Tests:  Recent Labs Lab 04/18/15 0837 04/19/15 0535  AST 95* 100*  ALT 74* 71*  ALKPHOS 56 56  BILITOT 0.6 1.0  PROT 6.8 6.2*  ALBUMIN 3.1* 2.9*    Recent Labs Lab 04/18/15 0837  LIPASE 26    CBC:  Recent Labs Lab 04/15/15 1121 04/18/15 0837 04/19/15 0535 04/20/15 0838  WBC 6.0 7.6 6.2 10.3  NEUTROABS 4.1 4.1  --  7.6  HGB 12.2* 13.6 11.6* 11.3*  HCT 38.1* 40.2 34.7* 33.9*  MCV 104.1* 102.3* 102.4* 102.4*  PLT 183 162 144* 141*   CBG:  Recent Labs Lab 04/18/15 2121 04/19/15 0605 04/19/15 1116  GLUCAP 253* 275* 318*    CREATININE: 3.13 mg/dL ABNORMAL (04/20/15 0838) Estimated creatinine clearance - 14.6 mL/min  Radiological Exams on Admission: Ct Lumbar Spine Wo Contrast  04/20/2015  CLINICAL DATA:  Back pain with BILATERAL  leg weakness which began after being moved on to x-ray table 2 days ago. EXAM: CT LUMBAR SPINE WITHOUT CONTRAST TECHNIQUE: Multidetector CT imaging of the lumbar spine was performed without intravenous contrast administration. Multiplanar CT image reconstructions were also generated. COMPARISON:  Lumbar radiographs 02/28/2015. CT abdomen and pelvis 04/18/2015. FINDINGS: The patient has undergone previous lumbar fusion. Solid arthrodesis at L2-3 anteriorly. Previous posterior decompression from L2 through L5 with some regrowth of posterior elements. Moderate facet arthropathy throughout. Trace anterolisthesis L4-5, otherwise anatomic alignment. Vacuum phenomenon at L5-S1 is chronic. No acute compression fracture or posttraumatic subluxation. Exuberant endplate reactive changes with osseous spurring. Benign-appearing Schmorl's node L5-S1 on the RIGHT. Shallow calcific protrusion L1-L2. Atherosclerosis. Paravertebral soft tissues unchanged from priors with stable LEFT adrenal nodule. IMPRESSION: No posttraumatic sequelae or concern for spinal infection. Postsurgical changes as described. Electronically Signed   By: Staci Righter M.D.   On: 04/20/2015 10:46    ASSESSMENT / PLAN   Acute on chronic back pain. Pain started yesterday prior to being discharged from [facility]. Pain radiating down BOTH legs. Lumbar CTscan unrevealing. Not candidate for MRI based on pacemaker. No loss of bowel or bladder function. May be musculoskeletal pain, neuropathic pain not excluded. No evidence for cord compression.  -  admit to Medical Bed -trial of muscle relaxers. Flexeril intolerant, trial of robaxin -PT evaluation -heating pad to lower back -continue treating empirically with steroids or Neurosurgical evaluation.  -Gentle IV hydration   Urinary retention. He has had this intermittently for years.  Patient is s/p remote TURP.  Cystoscopy several months ago showed regrowth of median lobe with obstruction. Patient underwent TURP  in April 2016. Recurrent urinary retention during admission a few days ago, foley placed and flomax started.Marland KitchenPer wife, Urology recommended foley for total of 5 days then an office follow up.  -He is on day #3 of foley. If still hospitalized on day 5 will need to remove foley for voiding trial. If still retaining urine, will consult Urology or at least reinsert foley and get patient an outpatient Urology follow up.   - continue flomax  Type 2 diabetes mellitus with peripheral neuropathy.  -monitor CBGs an start SSI  Paroxysmal atrial fibrillation / SSS / Pacemaker. HR stable at 60. On Eliquis and Amiodarone.  -Continue home regimen.   CKD, stage 4, Recently with acute on chronic kidney disease. Creatinine steadily improving.  -am BMET  Essential hypertension, stable.  Continue home anti-hypertensive meds.   Chronic systolic CHF, stable.  Has been unable to weigh at home making it a challenge for Cardiology to manage volume. Followed by [name].  -continue home metolaxone, demadex -I&O -daily weights  Physical deconditioning. May need inpatient rehab  -PT consult  CONSULTANTS:   none  Code Status:  full code DVT Prophylaxis:  Heparin Family Communication:  Spoke with daughter at bedside.   Disposition Plan: Discharge to home in 2-3 days   Time spent: 60 minutes Tye Savoy  NP [facility] Pager [phone]

## 2015-04-20 NOTE — ED Notes (Signed)
Admitting MD at the bedside.  

## 2015-04-20 NOTE — ED Notes (Signed)
Pt complaining of pain. Family asking for medications. MD made aware.

## 2015-04-20 NOTE — ED Notes (Signed)
Pt in to much pain and unable to move legs or hold then up.

## 2015-04-20 NOTE — ED Notes (Signed)
Attempted [place] IV. Unable to access

## 2015-04-20 NOTE — ED Notes (Signed)
Called to ask about patient's meal tray. Stated that the caf was behind and they would bring it up as soon as possible.

## 2015-04-20 NOTE — ED Notes (Signed)
Pt comes from home via [facility], was discharged yesterday for urinary retention, new c/o of back pain, pt family called EMS because pt has new immobility issue and back pain and family believes this is due to xray being done.

## 2015-04-21 DIAGNOSIS — I5022 Chronic systolic (congestive) heart failure: Secondary | ICD-10-CM | POA: Diagnosis not present

## 2015-04-21 DIAGNOSIS — E1142 Type 2 diabetes mellitus with diabetic polyneuropathy: Secondary | ICD-10-CM | POA: Diagnosis not present

## 2015-04-21 DIAGNOSIS — M5440 Lumbago with sciatica, unspecified side: Secondary | ICD-10-CM | POA: Diagnosis not present

## 2015-04-21 DIAGNOSIS — R269 Unspecified abnormalities of gait and mobility: Secondary | ICD-10-CM

## 2015-04-21 DIAGNOSIS — I495 Sick sinus syndrome: Secondary | ICD-10-CM | POA: Diagnosis not present

## 2015-04-21 LAB — BASIC METABOLIC PANEL
Anion gap: 10 (ref 5–15)
BUN: 58 mg/dL — AB (ref 6–20)
CALCIUM: 9.2 mg/dL (ref 8.9–10.3)
CO2: 32 mmol/L (ref 22–32)
CREATININE: 3 mg/dL — AB (ref 0.61–1.24)
Chloride: 92 mmol/L — ABNORMAL LOW (ref 101–111)
GFR calc non Af Amer: 16 mL/min — ABNORMAL LOW (ref >60)
GFR, EST AFRICAN AMERICAN: 19 mL/min — AB (ref >60)
GLUCOSE: 244 mg/dL — AB (ref 65–99)
Potassium: 3.4 mmol/L — ABNORMAL LOW (ref 3.5–5.1)
Sodium: 134 mmol/L — ABNORMAL LOW (ref 135–145)

## 2015-04-21 LAB — GLUCOSE, CAPILLARY
GLUCOSE-CAPILLARY: 296 mg/dL — AB (ref 65–99)
Glucose-Capillary: 240 mg/dL — ABNORMAL HIGH (ref 65–99)
Glucose-Capillary: 269 mg/dL — ABNORMAL HIGH (ref 65–99)
Glucose-Capillary: 260 mg/dL — ABNORMAL HIGH (ref 65–99)

## 2015-04-21 MED ORDER — PREDNISONE 50 MG PO TABS: 60.0000 mg | ORAL_TABLET | Freq: Every day | ORAL | Status: DC

## 2015-04-21 MED ORDER — INSULIN ASPART 100 UNIT/ML ~~LOC~~ SOLN
9.0000 [IU] | Freq: Three times a day (TID) | SUBCUTANEOUS | Status: DC
  Administered 2015-04-21 (×2): 9 [IU] via SUBCUTANEOUS

## 2015-04-21 MED ORDER — INSULIN GLARGINE 100 UNIT/ML ~~LOC~~ SOLN
9.0000 [IU] | Freq: Every day | SUBCUTANEOUS | Status: DC
  Administered 2015-04-21: 9 [IU] via SUBCUTANEOUS
  Filled 2015-04-21: qty 0.09

## 2015-04-21 NOTE — Progress Notes (Signed)
Pt a/o, no c/o pain, caregiver at bedside, pt resting comfortably, VSS, pt stable

## 2015-04-21 NOTE — Progress Notes (Signed)
Inpatient Diabetes Program Recommendations  AACE/ADA: New Consensus Statement on Inpatient Glycemic Control (2015)  Target Ranges:  Prepandial:   less than 140 mg/dL      Peak postprandial:   less than 180 mg/dL (1-2 hours)      Critically ill patients:  140 - 180 mg/dL   Review of Glycemic Control  Results for [name], BOVEE, MD (MRN [id number]) as of 04/21/2015 09:53  Ref. Range 04/19/2015 11:16 04/20/2015 14:20 04/20/2015 16:33 04/20/2015 22:09 04/21/2015 05:43  Glucose-Capillary Latest Ref Range: 65-99 mg/dL 318 (H) 209 (H) 251 (H) 248 (H) 240 (H)   Diabetes history: Type 2, A1C 8.8% on 04/15/15 Outpatient Diabetes medications: Novolog flex pen 9 units tid for blood sugars >175mg /dl, Lantus 9 units qday Current orders for Inpatient glycemic control: Novolog 0-9 units tid with meals  Inpatient Diabetes Program Recommendations:    Consider starting the patient on 9 units Lantus q day. Consider starting Novolog insulin 3 units tid with meal - continue Novolog correction as ordered.   [name], RN, BA, MHA, CDE Diabetes Coordinator Inpatient Diabetes Program  [phone] (Team Pager) [phone] ([place]) 04/21/2015 9:57 AM

## 2015-04-21 NOTE — Progress Notes (Signed)
04/21/15 - Pt now on day #4 of 5-day foley trial. Pt to have voiding trial after this 5-day foley trial, and then Urology to replace Foley if urine retention continues. Will continue to monitor.

## 2015-04-21 NOTE — Evaluation (Signed)
Physical Therapy Evaluation Patient Details Name: DIMARI CONFER, MD MRN: [id number] DOB: [birth year] Today's Date: 04/21/2015   History of Present Illness  pt presents with pt presents with CHF and Back Pain.  pt with hx of back surgery, TKA, Spinal Stenosis, CAD, PVD, CVA, HTN, CHF, DM, Pulmonary HTN, Pacemaker, CKD, Neuropathies, and hyponatremia.  pt also with multiple recent admits.    Clinical Impression  Pt at this time requiring extensive A for all mobility due to pain and overall weakness.  Pt Bil LEs buckle with attempts at standing and needed the Stedy in order to get OOB to recliner.  Noted that pt does have 24hr CG at home, but pt is requiring increased A and may benefit from having 2 CG at all times for safety.  Pt would benefit from SNF level of care at D/C, but only a CG present today, so discussed D/C planning with RN and MD.  If pt D/C to home would benefit from [facility] services and DME listed below.  Will continue to follow if remains on acute.      Follow Up Recommendations SNF    Equipment Recommendations  Other (comment) If D/C home pt will need Lift equipment (Stedy vs Harrel Lemon), W/C, and [employer].   Recommendations for Other Services       Precautions / Restrictions Precautions Precautions: Fall Restrictions Weight Bearing Restrictions: No      Mobility  Bed Mobility Overal bed mobility: Needs Assistance;+2 for physical assistance Bed Mobility: Rolling;Sidelying to Sit Rolling: Mod assist Sidelying to sit: Max assist;+2 for physical assistance;HOB elevated       General bed mobility comments: pt needs increased A to come to sitting due to very painful in back with bed mobility.    Transfers Overall transfer level: Needs assistance   Transfers: Sit to/from Stand Sit to Stand: Mod assist;+2 physical assistance         General transfer comment: Attempted to stand with RW and pt required MaxA and Bil knees buckled causing uncontrolled  sit back on bed.  Utilized Stedy to block pt's knees and allow pt to sit on Stedy during transfer to recliner.  pt had to come to stand again for peri hygiene as pt had loose BM while sitting EOB.    Ambulation/Gait                Stairs            Wheelchair Mobility    Modified Rankin (Stroke Patients Only)       Balance Overall balance assessment: Needs assistance;History of Falls Sitting-balance support: Bilateral upper extremity supported;Feet supported Sitting balance-Leahy Scale: Poor Sitting balance - Comments: pt needs intermittant MinA to maintain balance and Bil UE support.     Standing balance support: During functional activity;Bilateral upper extremity supported Standing balance-Leahy Scale: Zero                               Pertinent Vitals/Pain Pain Assessment: 0-10 Pain Score: 8  Pain Location: Back during mobility Pain Descriptors / Indicators: Aching Pain Intervention(s): Monitored during session;Premedicated before session;Repositioned    Home Living Family/patient expects to be discharged to:: Private residence Living Arrangements: Children;Non-relatives/Friends Available Help at Discharge: Personal care attendant;Available 24 hours/day;Family Type of Home: House Home Access: Stairs to enter Entrance Stairs-Rails: Right;Left;Can reach both Entrance Stairs-Number of Steps: 3 Home Layout: One level Home Equipment: Walker - 4 wheels;Tub bench;Hand  held shower head;Walker - 2 wheels;Adaptive equipment Additional Comments: pt has 2 steps to enter his bedroom with a single rail.      Prior Function Level of Independence: Needs assistance   Gait / Transfers Assistance Needed: Ambulated with rollator and would sit prn when legs got tired.  Walked short distances with the caregiver and son  ADL's / Homemaking Assistance Needed: assisted for tub bench transfer, to wash feet and back, for LB dressing, meal prep and housekeeping. Pt  routinely sits to perform grooming on his rollator.        Hand Dominance   Dominant Hand: Right    Extremity/Trunk Assessment   Upper Extremity Assessment: Generalized weakness           Lower Extremity Assessment: Generalized weakness      Cervical / Trunk Assessment: Kyphotic  Communication   Communication: HOH  Cognition Arousal/Alertness: Awake/alert Behavior During Therapy: WFL for tasks assessed/performed Overall Cognitive Status: Difficult to assess                      General Comments      Exercises        Assessment/Plan    PT Assessment Patient needs continued PT services  PT Diagnosis Difficulty walking;Generalized weakness;Acute pain   PT Problem List Decreased strength;Decreased activity tolerance;Decreased balance;Decreased mobility;Decreased coordination;Decreased knowledge of use of DME;Pain  PT Treatment Interventions DME instruction;Gait training;Stair training;Functional mobility training;Therapeutic activities;Therapeutic exercise;Balance training;Patient/family education   PT Goals (Current goals can be found in the Care Plan section) Acute Rehab PT Goals Patient Stated Goal: Get stronger. PT Goal Formulation: With patient Time For Goal Achievement: 05/05/15 Potential to Achieve Goals: Fair    Frequency Min 3X/week   Barriers to discharge        Co-evaluation               End of Session Equipment Utilized During Treatment: Gait belt;Oxygen Activity Tolerance: Patient limited by fatigue;Patient limited by pain Patient left: in chair;with call bell/phone within reach;with chair alarm set;with family/visitor present Nurse Communication: Mobility status;Need for lift equipment    Functional Assessment Tool Used: Clinical Judgement Functional Limitation: Mobility: Walking and moving around Mobility: Walking and Moving Around Current Status [phone]): At least 60 percent but less than 80 percent impaired, limited or  restricted Mobility: Walking and Moving Around Goal Status [phone]): At least 20 percent but less than 40 percent impaired, limited or restricted    Time: 0928-1009 PT Time Calculation (min) (ACUTE ONLY): 41 min   Charges:   PT Evaluation $Initial PT Evaluation Tier I: 1 Procedure PT Treatments $Therapeutic Activity: 23-37 mins   PT G Codes:   PT G-Codes **NOT FOR INPATIENT CLASS** Functional Assessment Tool Used: Clinical Judgement Functional Limitation: Mobility: Walking and moving around Mobility: Walking and Moving Around Current Status [id number]): At least 60 percent but less than 80 percent impaired, limited or restricted Mobility: Walking and Moving Around Goal Status [phone]): At least 20 percent but less than 40 percent impaired, limited or restricted    Catarina Hartshorn, [place] 04/21/2015, 10:34 AM

## 2015-04-21 NOTE — Progress Notes (Signed)
Discussed with Dr. Dierdre Harness who viewed the CT images and examined the patient. He suspects pain mainly related to bilateral hip arthritis. He recommends continuing oral prednisone and considering intra-articular hip injection if no improvement. Will hold Eliquis for now in case this is needed. Will discuss with IR if pain remains unchanged tomorrow.  Doree Barthel, MD

## 2015-04-21 NOTE — NC FL2 (Signed)
[place] LEVEL OF CARE SCREENING TOOL     IDENTIFICATION  Patient Name: Darrell Mulder, MD Birthdate: [birth year] Sex: male Admission Date (Current Location): 04/20/2015  [facility] and [place] Number: Herbalist and Address:  [facility] [place], [place] [address], [place], [place] [address]      Provider Number: [id number]  Attending Physician Name and Address:  Delfina Redwood, MD  Relative Name and Phone Number:  [name], [name]    Current Level of Care: Hospital Recommended Level of Care: [place] Prior Approval Number:    Date Approved/Denied:   PASRR Number: [id number]  Discharge Plan: SNF    Current Diagnoses: Patient Active Problem List   Diagnosis Date Noted  . Abnormality of gait 04/21/2015  . Back pain 04/20/2015  . Severe comorbid illness 04/20/2015  . Gait abnormality 04/20/2015  . Low back pain   . Lactic acidosis 04/18/2015  . AKI (acute kidney injury) ([place]) 04/18/2015  . Chronic systolic CHF (congestive heart failure) ([place]) 04/18/2015  . Constipation 04/18/2015  . SSS (sick sinus syndrome) ([place]) 04/18/2015  . Bilateral plantar fasciitis 03/01/2015  . Bilateral leg and foot pain 02/27/2015  . Foot pain, bilateral 02/27/2015  . Dehydration 10/04/2014  . Vertigo 10/04/2014  . Urinary retention due to benign prostatic hyperplasia 08/27/2014  . Foot drop, left 07/21/2014  . Leg weakness 07/16/2014  . Chronic diastolic heart failure ([place]) 04/16/2014  . Essential hypertension   . Gait disorder 03/31/2014  . Solitary pulmonary nodule 10/09/2013  . CKD (chronic kidney disease) stage 4, GFR 15-29 ml/min (HCC)   . Long term current use of amiodarone 06/25/2013  . Chronic anticoagulation 04/03/2013    Class: Chronic  . Pacemaker - St Jude 04/03/2013    Class: Chronic  . Paroxysmal atrial fibrillation, DCCV 06/16/13 08/22/2012    Class: Chronic  . Hypertensive cardiovascular disease    . Type 2 diabetes mellitus with peripheral neuropathy (HCC)     Orientation ACTIVITIES/SOCIAL BLADDER RESPIRATION    Self, Time, Situation, Place  Family supportive Continent, Indwelling catheter (Urinary Catheter) O2 (As needed) (Nasal Cannula 2L/min)  BEHAVIORAL SYMPTOMS/MOOD NEUROLOGICAL BOWEL NUTRITION STATUS   (N/A)  (N/A) Incontinent  (See DC summary)  PHYSICIAN VISITS COMMUNICATION OF NEEDS Height & Weight Skin    Verbally 5\' 4"  (162.6 cm) 174 lbs. Normal          AMBULATORY STATUS RESPIRATION    Assist extensive O2 (As needed) (Nasal Cannula 2L/min)      Personal Care Assistance Level of Assistance  Bathing, Feeding, Dressing Bathing Assistance: Maximum assistance Feeding assistance: Maximum assistance Dressing Assistance: Maximum assistance      Functional Limitations Info  Sight Sight Info: Impaired           SPECIAL CARE FACTORS FREQUENCY  PT (By licensed PT)     PT Frequency: 3x/week             Additional Factors Info  Code Status, Allergies, Insulin Sliding Scale Code Status Info: Full Allergies Info: Statins, Aggrenox, Carvedilol, Claritin, Cymbalta, Lyrica, Mucinex, Other, Phenergan, Plavix, Rapaflo, Travatan Z   Insulin Sliding Scale Info: insulin aspart (novoLOG) injection 0-9 Units 3x/day; insulin glargine (LANTUS) injection 9 Units at bedtime       Current Medications (04/21/2015):  This is the current hospital active medication list Current Facility-Administered Medications  Medication Dose Route Frequency Provider Last Rate Last Dose  . acetaminophen (TYLENOL) tablet 650 mg  650 mg  Oral Q6H PRN Willia Craze, NP       Or  . acetaminophen (TYLENOL) suppository 650 mg  650 mg Rectal Q6H PRN Willia Craze, NP      . allopurinol (ZYLOPRIM) tablet 100 mg  100 mg Oral BID Willia Craze, NP   100 mg at 04/21/15 1052  . amiodarone (PACERONE) tablet 200 mg  200 mg Oral QHS Willia Craze, NP   200 mg at 04/20/15 2222  . ferrous  sulfate tablet 325 mg  325 mg Oral QODAY Willia Craze, NP   325 mg at 04/21/15 1052  . HYDROcodone-acetaminophen (NORCO/VICODIN) 5-325 MG per tablet 1-2 tablet  1-2 tablet Oral Q4H PRN Willia Craze, NP   2 tablet at 04/21/15 1553  . insulin aspart (novoLOG) injection 0-9 Units  0-9 Units Subcutaneous TID WC Willia Craze, NP   5 Units at 04/21/15 1330  . insulin aspart (novoLOG) injection 9 Units  9 Units Subcutaneous TID WC Delfina Redwood, MD   9 Units at 04/21/15 1330  . insulin glargine (LANTUS) injection 9 Units  9 Units Subcutaneous QHS Delfina Redwood, MD      . isosorbide mononitrate (IMDUR) 24 hr tablet 60 mg  60 mg Oral Daily Willia Craze, NP   60 mg at 04/21/15 1052  . latanoprost (XALATAN) 0.005 % ophthalmic solution 1 drop  1 drop Both Eyes QHS Willia Craze, NP   1 drop at 04/20/15 2200  . methocarbamol (ROBAXIN) tablet 500 mg  500 mg Oral TID Willia Craze, NP   500 mg at 04/20/15 1414  . metolazone (ZAROXOLYN) tablet 2.5 mg  2.5 mg Oral Q7 days Willia Craze, NP   2.5 mg at 04/20/15 1647  . metoprolol tartrate (LOPRESSOR) tablet 25 mg  25 mg Oral Daily Willia Craze, NP   25 mg at 04/21/15 1052  . morphine 2 MG/ML injection 1 mg  1 mg Intravenous Q4H PRN Willia Craze, NP      . nitroGLYCERIN (NITROSTAT) SL tablet 0.4 mg  0.4 mg Sublingual Q5 min PRN Willia Craze, NP      . ondansetron [facility]) tablet 4 mg  4 mg Oral Q6H PRN Willia Craze, NP       Or  . ondansetron [facility]) injection 4 mg  4 mg Intravenous Q6H PRN Willia Craze, NP      . polyethylene glycol (MIRALAX / GLYCOLAX) packet 17 g  17 g Oral BID Willia Craze, NP   17 g at 04/21/15 1052  . polyvinyl alcohol (LIQUIFILM TEARS) 1.4 % ophthalmic solution 1 drop  1 drop Both Eyes BID Waldemar Dickens, MD   1 drop at 04/20/15 2200  . potassium chloride SA (K-DUR,KLOR-CON) CR tablet 20 mEq  20 mEq Oral Daily Willia Craze, NP   20 mEq at 04/21/15 1052  . [START ON 04/22/2015]  predniSONE (DELTASONE) tablet 60 mg  60 mg Oral Q breakfast Delfina Redwood, MD      . tamsulosin [facility]) capsule 0.4 mg  0.4 mg Oral QPC supper Willia Craze, NP   0.4 mg at 04/20/15 1647  . torsemide (DEMADEX) tablet 80 mg  80 mg Oral Daily Willia Craze, NP   80 mg at 04/21/15 1052     Discharge Medications: Please see discharge summary for a list of discharge medications.  Relevant Imaging Results:  Relevant Lab Results:  Recent Labs  Additional Information SSN: [id number]  Benard Halsted, LCSWA

## 2015-04-21 NOTE — Progress Notes (Signed)
PM Robaxin held due to lethargy. Pt not in pain and is resting comfortably. K pad unavailable from materials management tonight. Will continue to monitor.

## 2015-04-21 NOTE — Progress Notes (Signed)
Caregiver refuses administration of Robaxin, pt agrees

## 2015-04-21 NOTE — Care Management Obs Status (Signed)
[place] NOTIFICATION   Patient Details  Name: RONIN FARB, MD MRN: [id number] Date of Birth: 1918/08/16   Medicare Observation Status Notification Given:  Yes    Vergie Living, RN 04/21/2015, 3:20 PM

## 2015-04-21 NOTE — Progress Notes (Signed)
Pt continues with lethargy. Pt's assistant requests for staff to hold Robaxin due to lethargy. She states Flexeril also does the same, so she wishes to avoid muscle relaxers altogether. Information passed to day shift RN. Will continue to monitor.

## 2015-04-21 NOTE — Consult Note (Signed)
Reason for Consult:bilateral leg pain Referring Physician: Shiela Mayer, MD is an 81 y.o. male.  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 He was discharged from here two days ago after admission for acute urinary retention / constipation, acute on chronic CKD. His lactic acid level was elevated but not felt to be septic. Foley placed for urinary retention, flomax started. He is to follow up with Urology. As far as constipation, CTscan was negative for bowel abnormalities but did show prominence of the prostate with inferior bladder wall thickening, possibly bladder outlet obstruction. Miralax increased to 2-3 times daily. Pleural effusions seen but patient was asymptomatic.  Patient brought back to the emergency department yesterday for severe lower back pain radiating down both legs and associated ambulation problems. Patient does not usually take pain medications but just had some morphine . He is sleepy. Attendant at bedside, provides history. Patient began complaining of back pain yesterday while still hospitalized. He indicated the pain started after positioning for x-ray (patient did have a CT scan 2 days ago ). Despite the pain patient wanted to go home, thought he would feel more comfortable there . Unfortunately the lower back pain became progressively worse with radiation down into both legs. He could not stand because of the pain as well as associated weakness in both legs . Patient could not find a comfortable position for sleep last night. Wife brought him back to the emergency department in the middle of the night. No loss of bowel or bladder function. Patient has chronic back pain but apparently he has not had this degree of pain before. His last back surgery was approximately 20 years ago. Lumbar spine CT scan today negative for any acute  abnormalities  On discussion with the patient, he has significant baseline leg weakness, but complains of pain radiating into his hips, right greater than left.    Past Medical History  Diagnosis Date  . Hyperlipidemia   . Spinal stenosis   . Anemia   . Carotid artery disease ([place])     a. s/p prior carotid endarterectomy.  [name] PVD (peripheral vascular disease) ([place])   . Hyponatremia     Chronic  . CVA (cerebral infarction)     a. When asked to clarify patient says he was told thumb numbness may be TIA. He does not recall formal stroke. CT head results are only available through [facility], not in [facility].  . CKD (chronic kidney disease) stage 4, GFR 15-29 ml/min (HCC)   . Long term (current) use of anticoagulants   . PAF (paroxysmal atrial fibrillation) ([place])     a. Asymptomatic. On Eliquis. CHADSVASC 7.  . Sick sinus syndrome ([place])     With DDD St. Jude PM, initially placed in 1993 by Dr. Nils Pyle. Device upgrade 10/2002 to DDD, by Dr. Rollene Fare complicated by bleeding. Subsequent gen change 2011.  [name] Hypertensive cardiovascular disease   . Chronic diastolic congestive heart failure ([place])     ECHO 05/20/12 LVEF estimated by 2D at 55-60%  . Arthritis   . Gait disorder   . Diabetic peripheral neuropathy associated with type 2 diabetes mellitus ([place])   . Diabetes mellitus (HCC)     insulin dependent  . Elevated troponin I level 06/22/2014    a. 06/2014 felt due to demand ischemia.  . Chronic anticoagulation  For PAF, on Eliquis   . Urinary retention   . Pulmonary hypertension ([place])   . Foot drop, left 07/21/2014  . Asterixis 07/21/2014  . Hypertension   . Presence of permanent cardiac pacemaker   . Pneumonia     hx of pneumonia x 1   . Bilateral plantar fasciitis     Past Surgical History  Procedure Laterality Date  . Lumbar laminectomy  1995  . Back surgery    . Total knee arthroplasty Left   . Quadriceps tendon repair    . Cataract extraction    . Carotid endarterectomy    . Yag laser  application Right 1/60/1093    Procedure: YAG LASER CAPSULOTOMY OF RIGHT EYE;  Surgeon: [name], MD;  Location: [place];  Service: Ophthalmology;  Laterality: Right;  . Tonsillectomy    . Pacemaker insertion      DDD St. Jude PM, initially placed in 1993 by Dr. Nils Pyle. Device upgrade 10/2002 to DDD, by Dr. Rollene Fare complicated by bleeding.  . Carotid endarterectomy Right 2006  . Transurethral resection of prostate    . Cardioversion N/A 06/16/2013    Procedure: CARDIOVERSION BEDSIDE;  Surgeon: [name], MD;  Location: [place];  Service: Cardiovascular;  Laterality: N/A;  . Pacemaker generator change  12/09/2009    SJM Accent DR RF gen change by Dr [name]  . Insert / replace / remove pacemaker      2004   . Transurethral resection of prostate N/A 08/27/2014    Procedure: TRANSURETHRAL RESECTION OF THE PROSTATE WITH GYRUS INSTRUMENTS;  Surgeon: [name], MD;  Location: [facility];  Service: Urology;  Laterality: N/A;  . Cystoscopy N/A 08/27/2014    Procedure: CYSTOSCOPY;  Surgeon: [name], MD;  Location: [facility];  Service: Urology;  Laterality: N/A;    Family History  Problem Relation Age of Onset  . Multiple myeloma Father   . Hypertension Mother   . Healthy Sister   . COPD Sister   . Heart failure Sister     Social History:  reports that he quit smoking about 56 years ago. His smoking use included Pipe and Cigarettes. He quit after 2 years of use. He has never used smokeless tobacco. He reports that he drinks alcohol. He reports that he does not use illicit drugs.  Allergies:  Allergies  Allergen Reactions  . Statins Other (See Comments)    rhabdomyolisis  . Aggrenox [Aspirin-Dipyridamole Er] Other (See Comments)    Dizziness  . Carvedilol Other (See Comments)    Dizziness  . Claritin [Loratadine] Other (See Comments)    Dizziness & drowsiness   . Cymbalta [Duloxetine Hcl]     Confusion   . Lyrica [Pregabalin] Other (See Comments)    Makes him dizzy. Takes  him days to recover.  . Mucinex [Guaifenesin Er] Other (See Comments)    Dizziness, drowsiness  . Other Other (See Comments)    Preservatives in glaucoma gtts cause eye irritation, has to use preservative free  . Phenergan [Promethazine Hcl] Other (See Comments)    Causes severe confusion  . Plavix [Clopidogrel Bisulfate] Other (See Comments)    Dizziness  . Rapaflo [Silodosin] Other (See Comments)    Dizziness for about 30- 45  Minutes At time of preop appointment on 08/26/2014 patient denies any problems with this medication  . Travatan Z [Travoprost] Other (See Comments)    Eye irritation    Medications: I have reviewed the patient's current medications.  Results  for orders placed or performed during the hospital encounter of 04/20/15 (from the past 48 hour(s))  Basic metabolic panel     Status: Abnormal   Collection Time: 04/20/15  8:38 AM  Result Value Ref Range   Sodium 135 135 - 145 mmol/L   Potassium 4.1 3.5 - 5.1 mmol/L   Chloride 92 (L) 101 - 111 mmol/L   CO2 31 22 - 32 mmol/L   Glucose, Bld 210 (H) 65 - 99 mg/dL   BUN 59 (H) 6 - 20 mg/dL   Creatinine, Ser 3.13 (H) 0.61 - 1.24 mg/dL   Calcium 9.8 8.9 - 10.3 mg/dL   GFR calc non Af Amer 16 (L) >60 mL/min   GFR calc Af Amer 18 (L) >60 mL/min    Comment: (NOTE) The eGFR has been calculated using the CKD EPI equation. This calculation has not been validated in all clinical situations. eGFR's persistently <60 mL/min signify possible Chronic Kidney Disease.    Anion gap 12 5 - 15  CBC with Differential     Status: Abnormal   Collection Time: 04/20/15  8:38 AM  Result Value Ref Range   WBC 10.3 4.0 - 10.5 K/uL   RBC 3.31 (L) 4.22 - 5.81 MIL/uL   Hemoglobin 11.3 (L) 13.0 - 17.0 g/dL   HCT 33.9 (L) 39.0 - 52.0 %   MCV 102.4 (H) 78.0 - 100.0 fL   MCH 34.1 (H) 26.0 - 34.0 pg   MCHC 33.3 30.0 - 36.0 g/dL   RDW 15.6 (H) 11.5 - 15.5 %   Platelets 141 (L) 150 - 400 K/uL   Neutrophils Relative % 73 %   Neutro Abs 7.6 1.7  - 7.7 K/uL   Lymphocytes Relative 16 %   Lymphs Abs 1.6 0.7 - 4.0 K/uL   Monocytes Relative 11 %   Monocytes Absolute 1.1 (H) 0.1 - 1.0 K/uL   Eosinophils Relative 0 %   Eosinophils Absolute 0.0 0.0 - 0.7 K/uL   Basophils Relative 0 %   Basophils Absolute 0.0 0.0 - 0.1 K/uL  Urinalysis, Routine w reflex microscopic     Status: Abnormal   Collection Time: 04/20/15  8:43 AM  Result Value Ref Range   Color, Urine YELLOW YELLOW   APPearance CLEAR CLEAR   Specific Gravity, Urine 1.013 1.005 - 1.030   pH 7.5 5.0 - 8.0   Glucose, UA NEGATIVE NEGATIVE mg/dL   Hgb urine dipstick MODERATE (A) NEGATIVE   Bilirubin Urine NEGATIVE NEGATIVE   Ketones, ur NEGATIVE NEGATIVE mg/dL   Protein, ur NEGATIVE NEGATIVE mg/dL   Nitrite NEGATIVE NEGATIVE   Leukocytes, UA MODERATE (A) NEGATIVE  Urine microscopic-add on     Status: None   Collection Time: 04/20/15  8:43 AM  Result Value Ref Range   Squamous Epithelial / LPF NONE SEEN NONE SEEN   WBC, UA 0-5 0 - 5 WBC/hpf   RBC / HPF 0-5 0 - 5 RBC/hpf   Bacteria, UA NONE SEEN NONE SEEN  CBG monitoring, ED     Status: Abnormal   Collection Time: 04/20/15  2:20 PM  Result Value Ref Range   Glucose-Capillary 209 (H) 65 - 99 mg/dL  Glucose, capillary     Status: Abnormal   Collection Time: 04/20/15  4:33 PM  Result Value Ref Range   Glucose-Capillary 251 (H) 65 - 99 mg/dL  Glucose, capillary     Status: Abnormal   Collection Time: 04/20/15 10:09 PM  Result Value Ref Range   Glucose-Capillary 248 (  H) 65 - 99 mg/dL   Comment 1 Notify RN    Comment 2 Document in Chart   Basic metabolic panel     Status: Abnormal   Collection Time: 04/21/15  2:51 AM  Result Value Ref Range   Sodium 134 (L) 135 - 145 mmol/L   Potassium 3.4 (L) 3.5 - 5.1 mmol/L    Comment: DELTA CHECK NOTED   Chloride 92 (L) 101 - 111 mmol/L   CO2 32 22 - 32 mmol/L   Glucose, Bld 244 (H) 65 - 99 mg/dL   BUN 58 (H) 6 - 20 mg/dL   Creatinine, Ser 3.00 (H) 0.61 - 1.24 mg/dL   Calcium  9.2 8.9 - 10.3 mg/dL   GFR calc non Af Amer 16 (L) >60 mL/min   GFR calc Af Amer 19 (L) >60 mL/min    Comment: (NOTE) The eGFR has been calculated using the CKD EPI equation. This calculation has not been validated in all clinical situations. eGFR's persistently <60 mL/min signify possible Chronic Kidney Disease.    Anion gap 10 5 - 15  Glucose, capillary     Status: Abnormal   Collection Time: 04/21/15  5:43 AM  Result Value Ref Range   Glucose-Capillary 240 (H) 65 - 99 mg/dL  Glucose, capillary     Status: Abnormal   Collection Time: 04/21/15 12:35 PM  Result Value Ref Range   Glucose-Capillary 296 (H) 65 - 99 mg/dL    Ct Lumbar Spine Wo Contrast  04/20/2015  CLINICAL DATA:  Back pain with BILATERAL leg weakness which began after being moved on to x-ray table 2 days ago. EXAM: CT LUMBAR SPINE WITHOUT CONTRAST TECHNIQUE: Multidetector CT imaging of the lumbar spine was performed without intravenous contrast administration. Multiplanar CT image reconstructions were also generated. COMPARISON:  Lumbar radiographs 02/28/2015. CT abdomen and pelvis 04/18/2015. FINDINGS: The patient has undergone previous lumbar fusion. Solid arthrodesis at L2-3 anteriorly. Previous posterior decompression from L2 through L5 with some regrowth of posterior elements. Moderate facet arthropathy throughout. Trace anterolisthesis L4-5, otherwise anatomic alignment. Vacuum phenomenon at L5-S1 is chronic. No acute compression fracture or posttraumatic subluxation. Exuberant endplate reactive changes with osseous spurring. Benign-appearing Schmorl's node L5-S1 on the RIGHT. Shallow calcific protrusion L1-L2. Atherosclerosis. Paravertebral soft tissues unchanged from priors with stable LEFT adrenal nodule. IMPRESSION: No posttraumatic sequelae or concern for spinal infection. Postsurgical changes as described. Electronically Signed   By: Staci Righter M.D.   On: 04/20/2015 10:46    Review of Systems - Negative except As  above    Blood pressure 108/70, pulse 74, temperature 98.4 F (36.9 C), temperature source Axillary, resp. rate 18, height _0  (1.626 m), weight 79.2 kg (174 lb 9.7 oz), SpO2 100 %. Physical Exam  Constitutional: He is oriented to person, place, and time. He appears well-developed and well-nourished.  HENT:  Head: Normocephalic and atraumatic.  Eyes: EOM are normal. Pupils are equal, round, and reactive to light.  Neck: Normal range of motion. Neck supple.  GI: Soft.  Neurological: He is alert and oriented to person, place, and time. No cranial nerve deficit. GCS eye subscore is 4. GCS verbal subscore is 5. GCS motor subscore is 6. He displays no Babinski's sign on the right side. He displays no Babinski's sign on the left side.  Reflex Scores:      Patellar reflexes are 1+ on the right side and 1+ on the left side.      Achilles reflexes are 0 on the right  side and 0 on the left side. Poor DF and EHL, PF bilaterally.  Positive Patrick's Test R> L , negative SLR bilaterally.    Assessment/Plan: Patient has significant lumbar degeneration with multilevel foraminal stenosis and autofusion of L 23.  I believe his examination is more consistent with hip arthropathy, right greater than left, with markedly positive Patrick's tests, right greater than left.  Patient has restricted movement of both hips.  I have suggested oral steroids and if these give him no relief, an intra-articular hip injection may help alleviate his pain.  Peggyann Shoals, MD 04/21/2015, 5:00 PM

## 2015-04-21 NOTE — Progress Notes (Signed)
[facility] PROGRESS NOTE  [name], MD [id number] DOB: [birth year] DOA: 04/20/2015 PCP: [name], MD  Assessment/Plan:  Active Problems:   Type 2 diabetes mellitus with peripheral neuropathy (HCC)   Paroxysmal atrial fibrillation, DCCV 06/16/13   Pacemaker - St Jude   Long term current use of amiodarone   CKD (chronic kidney disease) stage 4, GFR 15-29 ml/min (HCC)   Essential hypertension   Chronic diastolic heart failure (HCC)   Urinary retention due to benign prostatic hyperplasia   Chronic systolic CHF (congestive heart failure) (HCC)   SSS (sick sinus syndrome) (HCC)   Back pain   Severe comorbid illness   Gait abnormality  Pain and difficulty walking no better. Discussed with PT.  They are recommending SNF, but pt is observation status. I have discussed with social work. Patient may decide to pay out of pocket. If not, may require left and other DME at home. Discussed the case with Dr. Vertell Limber. Will start steroids and he will review the CAT scan and examined the patient. Will resume home diabetes regimen and may need to adjust on steroids. Discontinue IV fluids  Still has Foley in place for urinary retention that proceeded the acute back pain. Has an appointment with urology on 12/14. Continue Foley for now.  Code Status:  full Family Communication:  Daughter, Jamesetta Orleans" [phone] Disposition Plan:  ?  Consultants:   neurosurgery   Procedures:     Antibiotics:    HPI/Subjective:   pain no better.  Objective: Filed Vitals:   04/20/15 2004 04/21/15 0445  BP: 121/57 118/55  Pulse: 66 68  Temp: 99.2 F (37.3 C) 98.4 F (36.9 C)  Resp: 18 18    Intake/Output Summary (Last 24 hours) at 04/21/15 1050 Last data filed at 04/21/15 0658  Gross per 24 hour  Intake 975.83 ml  Output    975 ml  Net   0.83 ml   Filed Weights   04/20/15 0643 04/20/15 1610 04/21/15 0445  Weight: 79.833 kg (176 lb) 79.788 kg (175 lb 14.4 oz) 79.2 kg (174 lb  9.7 oz)    Exam:   General:   in chair. Cooperative. Slightly confused, thinks he is "in rehabilitation".   Cardiovascular:  regular rate rhythm without murmurs gallops rubs  Respiratory:  clear to auscultation bilaterally without wheezes rhonchi or rales  Abdomen:  soft nontender nondistended  Ext:  clubbing cyanosis or edema   Neurologic: Lower extremities strength 4+/5. Diminished deep tendon reflexes  Basic Metabolic Panel:  Recent Labs Lab 04/15/15 1121 04/18/15 0837 04/19/15 0535 04/20/15 0838 04/21/15 0251  NA 134* 137 133* 135 134*  K 3.3* 3.9 3.7 4.1 3.4*  CL 88* 90* 93* 92* 92*  CO2 35* 37* 30 31 32  GLUCOSE 291* 196* 271* 210* 244*  BUN 65* 68* 65* 59* 58*  CREATININE 3.45* 3.48* 3.22* 3.13* 3.00*  CALCIUM 9.6 10.2 9.4 9.8 9.2   Liver Function Tests:  Recent Labs Lab 04/18/15 0837 04/19/15 0535  AST 95* 100*  ALT 74* 71*  ALKPHOS 56 56  BILITOT 0.6 1.0  PROT 6.8 6.2*  ALBUMIN 3.1* 2.9*    Recent Labs Lab 04/18/15 0837  LIPASE 26   No results for input(s): AMMONIA in the last 168 hours. CBC:  Recent Labs Lab 04/15/15 1121 04/18/15 0837 04/19/15 0535 04/20/15 0838  WBC 6.0 7.6 6.2 10.3  NEUTROABS 4.1 4.1  --  7.6  HGB 12.2* 13.6 11.6* 11.3*  HCT 38.1* 40.2 34.7* 33.9*  MCV 104.1* 102.3* 102.4* 102.4*  PLT 183 162 144* 141*   Cardiac Enzymes: No results for input(s): CKTOTAL, CKMB, CKMBINDEX, TROPONINI in the last 168 hours. BNP (last 3 results)  Recent Labs  09/04/14 1439 10/04/14 1241 02/27/15 0924  BNP 1383.6* 467.9* 325.4*    ProBNP (last 3 results)  Recent Labs  07/16/14 1213  PROBNP 460.0*    CBG:  Recent Labs Lab 04/19/15 1116 04/20/15 1420 04/20/15 1633 04/20/15 2209 04/21/15 0543  GLUCAP 318* 209* 251* 248* 240*    No results found for this or any previous visit (from the past 240 hour(s)).   Studies: Ct Lumbar Spine Wo Contrast  04/20/2015  CLINICAL DATA:  Back pain with BILATERAL leg weakness  which began after being moved on to x-ray table 2 days ago. EXAM: CT LUMBAR SPINE WITHOUT CONTRAST TECHNIQUE: Multidetector CT imaging of the lumbar spine was performed without intravenous contrast administration. Multiplanar CT image reconstructions were also generated. COMPARISON:  Lumbar radiographs 02/28/2015. CT abdomen and pelvis 04/18/2015. FINDINGS: The patient has undergone previous lumbar fusion. Solid arthrodesis at L2-3 anteriorly. Previous posterior decompression from L2 through L5 with some regrowth of posterior elements. Moderate facet arthropathy throughout. Trace anterolisthesis L4-5, otherwise anatomic alignment. Vacuum phenomenon at L5-S1 is chronic. No acute compression fracture or posttraumatic subluxation. Exuberant endplate reactive changes with osseous spurring. Benign-appearing Schmorl's node L5-S1 on the RIGHT. Shallow calcific protrusion L1-L2. Atherosclerosis. Paravertebral soft tissues unchanged from priors with stable LEFT adrenal nodule. IMPRESSION: No posttraumatic sequelae or concern for spinal infection. Postsurgical changes as described. Electronically Signed   By: Staci Righter M.D.   On: 04/20/2015 10:46    Scheduled Meds: . allopurinol  100 mg Oral BID  . amiodarone  200 mg Oral QHS  . apixaban  2.5 mg Oral BID  . ferrous sulfate  325 mg Oral QODAY  . insulin aspart  9 Units Subcutaneous TID WC  . insulin aspart  0-9 Units Subcutaneous TID WC  . insulin glargine  9 Units Subcutaneous QHS  . isosorbide mononitrate  60 mg Oral Daily  . latanoprost  1 drop Both Eyes QHS  . methocarbamol  500 mg Oral TID  . metolazone  2.5 mg Oral Q7 days  . metoprolol tartrate  25 mg Oral Daily  . polyethylene glycol  17 g Oral BID  . polyvinyl alcohol  1 drop Both Eyes BID  . potassium chloride SA  20 mEq Oral Daily  . [START ON 04/22/2015] predniSONE  60 mg Oral Q breakfast  . tamsulosin  0.4 mg Oral QPC supper  . torsemide  80 mg Oral Daily   Continuous Infusions: . sodium  chloride 50 mL/hr at 04/20/15 1615    Time spent: 35 minutes  [facility]  [email or web address], password [facility] 04/21/2015, 10:50 AM

## 2015-04-22 ENCOUNTER — Observation Stay (HOSPITAL_COMMUNITY): Payer: Medicare Other

## 2015-04-22 DIAGNOSIS — E1142 Type 2 diabetes mellitus with diabetic polyneuropathy: Secondary | ICD-10-CM | POA: Diagnosis not present

## 2015-04-22 DIAGNOSIS — N2889 Other specified disorders of kidney and ureter: Secondary | ICD-10-CM | POA: Diagnosis not present

## 2015-04-22 DIAGNOSIS — M5440 Lumbago with sciatica, unspecified side: Secondary | ICD-10-CM | POA: Diagnosis not present

## 2015-04-22 DIAGNOSIS — R269 Unspecified abnormalities of gait and mobility: Secondary | ICD-10-CM | POA: Diagnosis not present

## 2015-04-22 LAB — GLUCOSE, CAPILLARY
Glucose-Capillary: 225 mg/dL — ABNORMAL HIGH (ref 65–99)
Glucose-Capillary: 288 mg/dL — ABNORMAL HIGH (ref 65–99)
Glucose-Capillary: 243 mg/dL — ABNORMAL HIGH (ref 65–99)
Glucose-Capillary: 163 mg/dL — ABNORMAL HIGH (ref 65–99)

## 2015-04-22 MED ORDER — HEPARIN SODIUM (PORCINE) 5000 UNIT/ML IJ SOLN
5000.0000 [IU] | Freq: Three times a day (TID) | INTRAMUSCULAR | Status: DC
  Administered 2015-04-22 – 2015-04-24 (×6): 5000 [IU] via SUBCUTANEOUS
  Filled 2015-04-22 (×6): qty 1

## 2015-04-22 MED ORDER — INSULIN ASPART 100 UNIT/ML ~~LOC~~ SOLN: 9.0000 [IU] | Freq: Three times a day (TID) | SUBCUTANEOUS | Status: DC

## 2015-04-22 MED ORDER — INSULIN GLARGINE 100 UNIT/ML ~~LOC~~ SOLN
14.0000 [IU] | Freq: Every day | SUBCUTANEOUS | Status: DC
  Administered 2015-04-22 – 2015-04-23 (×2): 14 [IU] via SUBCUTANEOUS
  Filled 2015-04-22 (×2): qty 0.14

## 2015-04-22 MED ORDER — PREDNISONE 50 MG PO TABS
60.0000 mg | ORAL_TABLET | Freq: Every day | ORAL | Status: DC
  Administered 2015-04-22 – 2015-04-25 (×4): 60 mg via ORAL
  Filled 2015-04-22 (×4): qty 1

## 2015-04-22 MED ORDER — INSULIN ASPART 100 UNIT/ML ~~LOC~~ SOLN
12.0000 [IU] | Freq: Three times a day (TID) | SUBCUTANEOUS | Status: DC
  Administered 2015-04-22 – 2015-04-23 (×4): 12 [IU] via SUBCUTANEOUS

## 2015-04-22 NOTE — Clinical Social Work Note (Signed)
Clinical Social Work Assessment  Patient Details  Name: Darrell AMOR, MD MRN: [id number] Date of Birth: 1918/09/25  Date of referral:  04/21/15               Reason for consult:  Facility Placement                Permission sought to share information with:  Family Supports, Chartered certified accountant granted to share information::  Yes, Verbal Permission Granted  Name::     [facility] SNF's, Daughter, Son, Caregiver [name])  Agency::     Relationship::     Contact Information:     Housing/Transportation Living arrangements for the past 2 months:  Single Family Home Source of Information:  Patient, Other (Comment Required) (24 hour caregiver) Patient Interpreter Needed:  None Criminal Activity/Legal Involvement Pertinent to Current Situation/Hospitalization:  No - Comment as needed Significant Relationships:  Adult Children (24 hour caregiver) Lives with:  Self Do you feel safe going back to the place where you live?  Yes Need for family participation in patient care:  Yes (Comment)  Care giving concerns:  Has 24 hour caregiver at home but has had multiple past admissions to the hospital.  Presents with severe back pain on this admission and difficulty walking.  Social Worker assessment / plan:  81 year old male who is well known to this SW from past hospitalizations. Patient lives at home and has a 24 hour caregiver who is extremely involved in his supportive care. Patient has 3 children- 2 live locally.  He has been placed in the past in a SNF but did not stay in the facility for more than a day before signing himself out.  PT currently recommends SNF rehab; discussed with patient and caregiver. Patient noted to be alert and oriented but easily distracted by his hearing aide, dentures and other issues.  Patient agreed to SNF search 9(reluctantly) and also stated that he wanted his kids to be involved in d/c planning as needed- particularly his daughter.  Fl2  initiated and will be sent to [place] for bed search.     Employment status:  Retired Forensic scientist:  Medicare PT Recommendations:  [place] / Referral to community resources:  [place]  Patient/Family's Response to care: Patient appeared to have a difficult time focusing on subject of his care issues/needs- however indicated that he is very worried about his "back pain" and cannot seem to get comfortable.  Patient indicated that he feels his back was "injured the other day after he had an xray and has not been right since." He states he is doing a little better but still having pain in his back.   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  He has always been very aware of and involved in his treatment plan and understanding of his diagnosis and prognosis.  His live in caregiver is also extremely involved and aware of his condition and treatment plan and frequently leads the discussions about his medications, treatment plan etc. Patient often noted to defer to his caregiver on many issues.  He indicated that his children are also very involved in his care and that he trusts their judgement.  Emotional Assessment Appearance:  Appears stated age Attitude/Demeanor/Rapport:   (cooperative, responsive) Affect (typically observed):  Restless, Frustrated, Pleasant Orientation:  Oriented to Self,  Oriented to Place, Oriented to  Time, Oriented to Situation Alcohol / Substance use:  Tobacco Use, Alcohol Use (quit smoking 50+ years ago,) Psych involvement (Current and /or in the community):  No (Comment)  Discharge Needs  Concerns to be addressed:  Care Coordination (Frequent hospitalizations) Readmission within the last 30 days:  Yes Current discharge risk:  Dependent with Mobility, Physical  Impairment Barriers to Discharge:  Continued Medical Work up, Other (Observation and Medicare Patient. no 3 day qualifying stay)   Estill Bakes 04/22/2015, 4:15 PM

## 2015-04-22 NOTE — Clinical Social Work Placement (Signed)
   CLINICAL SOCIAL WORK PLACEMENT  NOTE  Date:  04/21/2015 Patient Details  Name: RONITH RAPER, MD MRN: [id number] Date of Birth: Dec 07, 1918  Clinical Social Work is seeking post-discharge placement for this patient at the [place] level of care (*CSW will initial, date and re-position this form in  chart as items are completed):  Yes   Patient/family provided with [place] Work Department's list of facilities offering this level of care within the geographic area requested by the patient (or if unable, by the patient's family).  Yes   Patient/family informed of their freedom to choose among providers that offer the needed level of care, that participate in Medicare, Medicaid or managed care program needed by the patient, have an available bed and are willing to accept the patient.  Yes   Patient/family informed of [facility]'s ownership interest in [facility] and [facility], as well as of the fact that they are under no obligation to receive care at these facilities.  PASRR submitted to EDS on       PASRR number received on       Existing PASRR number confirmed on 04/21/15     FL2 transmitted to all facilities in geographic area requested by pt/family on 04/21/15     FL2 transmitted to all facilities within larger geographic area on       Patient informed that his/her managed care company has contracts with or will negotiate with certain facilities, including the following:            Patient/family informed of bed offers received.  Patient chooses bed at       Physician recommends and patient chooses bed at      Patient to be transferred to   on  .  Patient to be transferred to facility by       Patient family notified on   of transfer.  Name of family member notified:        PHYSICIAN Please prepare priority discharge summary, including medications, Please sign FL2, Please prepare prescriptions     Additional Comment:     _______________________________________________ Williemae Area, LCSW 04/21/2015  1:00 PM

## 2015-04-22 NOTE — Progress Notes (Signed)
Subjective: Patient reports "I don't hurt right now, but when I move, it's in my back and hips"  Objective: Vital signs in last 24 hours: Temp:  [97.4 F (36.3 C)-97.8 F (36.6 C)] 97.8 F (36.6 C) (12/09 0437) Pulse Rate:  [64-74] 64 (12/09 0437) Resp:  [16-18] 18 (12/09 0437) BP: (105-129)/(52-70) 129/57 mmHg (12/09 0437) SpO2:  [92 %-100 %] 92 % (12/09 0437) Weight:  [79.4 kg (175 lb 0.7 oz)] 79.4 kg (175 lb 0.7 oz) (12/09 0437)  Intake/Output from previous day: 12/08 0701 - 12/09 0700 In: 480 [P.O.:480] Out: 900 [Urine:900] Intake/Output this shift: Total I/O In: -  Out: 350 [Urine:350]  Eating breakfast, assisted by caregiver. Reports no pain at present. Aware & agreeable to recommendations for course of oral steroids, with option for hip injection later if pain persists (if cleared by cardiologist [id number] pause).  This can be addressed in outpatient setting.   Lab Results:  Recent Labs  04/20/15 0838  WBC 10.3  HGB 11.3*  HCT 33.9*  PLT 141*   BMET  Recent Labs  04/20/15 0838 04/21/15 0251  NA 135 134*  K 4.1 3.4*  CL 92* 92*  CO2 31 32  GLUCOSE 210* 244*  BUN 59* 58*  CREATININE 3.13* 3.00*  CALCIUM 9.8 9.2    Studies/Results: Ct Lumbar Spine Wo Contrast  04/20/2015  CLINICAL DATA:  Back pain with BILATERAL leg weakness which began after being moved on to x-ray table 2 days ago. EXAM: CT LUMBAR SPINE WITHOUT CONTRAST TECHNIQUE: Multidetector CT imaging of the lumbar spine was performed without intravenous contrast administration. Multiplanar CT image reconstructions were also generated. COMPARISON:  Lumbar radiographs 02/28/2015. CT abdomen and pelvis 04/18/2015. FINDINGS: The patient has undergone previous lumbar fusion. Solid arthrodesis at L2-3 anteriorly. Previous posterior decompression from L2 through L5 with some regrowth of posterior elements. Moderate facet arthropathy throughout. Trace anterolisthesis L4-5, otherwise anatomic alignment.  Vacuum phenomenon at L5-S1 is chronic. No acute compression fracture or posttraumatic subluxation. Exuberant endplate reactive changes with osseous spurring. Benign-appearing Schmorl's node L5-S1 on the RIGHT. Shallow calcific protrusion L1-L2. Atherosclerosis. Paravertebral soft tissues unchanged from priors with stable LEFT adrenal nodule. IMPRESSION: No posttraumatic sequelae or concern for spinal infection. Postsurgical changes as described. Electronically Signed   By: Staci Righter M.D.   On: 04/20/2015 10:46    Assessment/Plan:    No role for lumbar surgery and pt agrees. Pt may follow up in office if pain persists after steroid course.    [name] 04/22/2015, 8:44 AM

## 2015-04-22 NOTE — Progress Notes (Signed)
CSW spoke with patient's daughter- Mrs. Janeann Forehand this afternoon per request of patient.  She was interested in cost of private pay services at the SNF as patient will not have a Medicare covered stay. Discussed bed search process and facility costs. SNF would be able to bill Medicare for PT and medications but not for room and board. Daughter indicated that since she does not live in [place] she is unsure about SNF's and defers this to her brother who lives in [place].  She is aware and agreeable to bed search but is not sure if her father will actually agree to placement or if he did agree - she is unsure if he would stay in a SNF for rehab.  She stated that he has always been very much in control and is very head strong and determined.  She is also ok with him returning home with continued home care with private care should he refuse SNF. Patient discussed with MD- hopefully will be ready the first of next week as the cause of his back pain has yet to be discovered.  Lorie Phenix. Pauline Good, [place]

## 2015-04-22 NOTE — Progress Notes (Addendum)
[facility] PROGRESS NOTE  [name], MD [id number] DOB: 1918/05/21 DOA: 04/20/2015 PCP: [name], MD  Assessment/Plan:  Principal Problem:   Back pain Active Problems:   Type 2 diabetes mellitus with peripheral neuropathy (HCC)   Paroxysmal atrial fibrillation, DCCV 06/16/13   Pacemaker - St Jude   Long term current use of amiodarone   CKD (chronic kidney disease) stage 4, GFR 15-29 ml/min (HCC)   Essential hypertension   Chronic diastolic heart failure (HCC)   Urinary retention due to benign prostatic hyperplasia   Chronic systolic CHF (congestive heart failure) (HCC)   SSS (sick sinus syndrome) (HCC)   Severe comorbid illness   Gait abnormality   Abnormality of gait  Pain is still severe. Discussed with Dr. Vertell Limber who feels much of the pain is related to his hip osteoarthritis. Eliquis was held and last dose was yesterday morning. Discussed with Dr. Reesa Chew. He would need 48 hours off Eliquis prior to hip injection. Will continue prednisone in the interim and tentatively schedule hip injection for Monday. Continue physical therapy. Will likely need skilled nursing facility unless pain improves and patient more ambulatory.  Blood glucoses high on prednisone. Will increase insulin.  Still has Foley in place for urinary retention that proceeded the acute back pain. Has an appointment with urology on 12/14. Continue Foley for now.  Code Status:  full Family Communication:  Daughter, Jamesetta Orleans" [phone] Disposition Plan:  SNF?  Consultants:   neurosurgery   IR  Procedures:     Antibiotics:    HPI/Subjective:   pain no better.  Objective: Filed Vitals:   04/22/15 0437 04/22/15 1026  BP: 129/57 121/56  Pulse: 64 93  Temp: 97.8 F (36.6 C)   Resp: 18     Intake/Output Summary (Last 24 hours) at 04/22/15 1115 Last data filed at 04/22/15 0956  Gross per 24 hour  Intake    240 ml  Output   1251 ml  Net  -1011 ml   Filed Weights   04/20/15 1610 04/21/15 0445 04/22/15 0437  Weight: 79.788 kg (175 lb 14.4 oz) 79.2 kg (174 lb 9.7 oz) 79.4 kg (175 lb 0.7 oz)    Exam:   General:   in chair. Cooperative.  slow to respond but oriented.   Cardiovascular:  regular rate rhythm without murmurs gallops rubs  Respiratory:  clear to auscultation bilaterally without wheezes rhonchi or rales  Abdomen:  soft nontender nondistended  Ext:  clubbing cyanosis or edema   Neurologic: Lower extremities strength 4+/5. Diminished deep tendon reflexes   musculoskeletal: Pain with rotation of right hip  Basic Metabolic Panel:  Recent Labs Lab 04/15/15 1121 04/18/15 0837 04/19/15 0535 04/20/15 0838 04/21/15 0251  NA 134* 137 133* 135 134*  K 3.3* 3.9 3.7 4.1 3.4*  CL 88* 90* 93* 92* 92*  CO2 35* 37* 30 31 32  GLUCOSE 291* 196* 271* 210* 244*  BUN 65* 68* 65* 59* 58*  CREATININE 3.45* 3.48* 3.22* 3.13* 3.00*  CALCIUM 9.6 10.2 9.4 9.8 9.2   Liver Function Tests:  Recent Labs Lab 04/18/15 0837 04/19/15 0535  AST 95* 100*  ALT 74* 71*  ALKPHOS 56 56  BILITOT 0.6 1.0  PROT 6.8 6.2*  ALBUMIN 3.1* 2.9*    Recent Labs Lab 04/18/15 0837  LIPASE 26   No results for input(s): AMMONIA in the last 168 hours. CBC:  Recent Labs Lab 04/15/15 1121 04/18/15 0837 04/19/15 0535 04/20/15 0838  WBC 6.0 7.6 6.2 10.3  NEUTROABS 4.1 4.1  --  7.6  HGB 12.2* 13.6 11.6* 11.3*  HCT 38.1* 40.2 34.7* 33.9*  MCV 104.1* 102.3* 102.4* 102.4*  PLT 183 162 144* 141*   Cardiac Enzymes: No results for input(s): CKTOTAL, CKMB, CKMBINDEX, TROPONINI in the last 168 hours. BNP (last 3 results)  Recent Labs  09/04/14 1439 10/04/14 1241 02/27/15 0924  BNP 1383.6* 467.9* 325.4*    ProBNP (last 3 results)  Recent Labs  07/16/14 1213  PROBNP 460.0*    CBG:  Recent Labs Lab 04/21/15 0543 04/21/15 1235 04/21/15 1707 04/21/15 2057 04/22/15 0622  GLUCAP 240* 296* 269* 260* 225*    No results found for this or any  previous visit (from the past 240 hour(s)).   Studies: No results found.  Scheduled Meds: . allopurinol  100 mg Oral BID  . amiodarone  200 mg Oral QHS  . ferrous sulfate  325 mg Oral QODAY  . insulin aspart  0-9 Units Subcutaneous TID WC  . insulin aspart  12 Units Subcutaneous TID WC  . insulin glargine  14 Units Subcutaneous QHS  . isosorbide mononitrate  60 mg Oral Daily  . latanoprost  1 drop Both Eyes QHS  . methocarbamol  500 mg Oral TID  . metolazone  2.5 mg Oral Q7 days  . metoprolol tartrate  25 mg Oral Daily  . polyethylene glycol  17 g Oral BID  . polyvinyl alcohol  1 drop Both Eyes BID  . potassium chloride SA  20 mEq Oral Daily  . predniSONE  60 mg Oral Q breakfast  . tamsulosin  0.4 mg Oral QPC supper  . torsemide  80 mg Oral Daily   Continuous Infusions:    Time spent: 35 minutes  [facility]  [email or web address], password [facility] 04/22/2015, 11:15 AM

## 2015-04-23 DIAGNOSIS — E1142 Type 2 diabetes mellitus with diabetic polyneuropathy: Secondary | ICD-10-CM | POA: Diagnosis not present

## 2015-04-23 LAB — BASIC METABOLIC PANEL
ANION GAP: 12 (ref 5–15)
BUN: 77 mg/dL — ABNORMAL HIGH (ref 6–20)
CO2: 29 mmol/L (ref 22–32)
Calcium: 9.5 mg/dL (ref 8.9–10.3)
Chloride: 91 mmol/L — ABNORMAL LOW (ref 101–111)
Creatinine, Ser: 3.59 mg/dL — ABNORMAL HIGH (ref 0.61–1.24)
GFR calc Af Amer: 15 mL/min — ABNORMAL LOW (ref >60)
GFR, EST NON AFRICAN AMERICAN: 13 mL/min — AB (ref >60)
Glucose, Bld: 157 mg/dL — ABNORMAL HIGH (ref 65–99)
POTASSIUM: 3.7 mmol/L (ref 3.5–5.1)
SODIUM: 132 mmol/L — AB (ref 135–145)

## 2015-04-23 LAB — GLUCOSE, CAPILLARY
GLUCOSE-CAPILLARY: 213 mg/dL — AB (ref 65–99)
GLUCOSE-CAPILLARY: 303 mg/dL — AB (ref 65–99)
Glucose-Capillary: 156 mg/dL — ABNORMAL HIGH (ref 65–99)
Glucose-Capillary: 323 mg/dL — ABNORMAL HIGH (ref 65–99)

## 2015-04-23 MED ORDER — METHOCARBAMOL 500 MG PO TABS: 500.0000 mg | ORAL_TABLET | Freq: Three times a day (TID) | ORAL | Status: DC | PRN

## 2015-04-23 MED ORDER — ACETAMINOPHEN 325 MG PO TABS
650.0000 mg | ORAL_TABLET | Freq: Three times a day (TID) | ORAL | Status: DC
  Administered 2015-04-23 – 2015-04-26 (×7): 650 mg via ORAL
  Filled 2015-04-23 (×9): qty 2

## 2015-04-23 NOTE — Progress Notes (Addendum)
The pt is very weak and required four people to get him out of bed and onto the [facility] via the Denna Haggard at 2330 and a gait belt.  Upon putting him back on the bed, he leaned forward suddenly when sitting, but was steadied by the staff.  He was also confused at that time.  Per the RN that gave report, this confusion started this evening and the RR nurse also checked on the pt at 2030 because he kept calling the secretary on another unit.  The private caregiver stated that she believe the Robaxin is causing the patient's confusion.  He has been asleep most of the night and his private caregiver has been at the bedside.

## 2015-04-23 NOTE — Progress Notes (Signed)
[facility] PROGRESS NOTE  Darrell Mulder, MD [id number] DOB: 20-Aug-1918 DOA: 04/20/2015 PCP: [name], MD  Assessment/Plan:  Principal Problem:   Back pain Active Problems:   Type 2 diabetes mellitus with peripheral neuropathy (HCC)   Paroxysmal atrial fibrillation, DCCV 06/16/13   Pacemaker - St Jude   Long term current use of amiodarone   CKD (chronic kidney disease) stage 4, GFR 15-29 ml/min (HCC)   Essential hypertension   Chronic diastolic heart failure (HCC)   Urinary retention due to benign prostatic hyperplasia   Chronic systolic CHF (congestive heart failure) (HCC)   SSS (sick sinus syndrome) (HCC)   Severe comorbid illness   Gait abnormality   Abnormality of gait  Pain is still severe. Continue pred. Hip injection Monday. Recommend SNF. Change robaxin to prn. Too somnolent  CBG better on increased insulin  Still has Foley in place for urinary retention that proceeded the acute back pain. Has an appointment with urology on 12/14. Continue Foley for now.  Code Status:  full Family Communication:  Daughter, Jamesetta Orleans [id number] 12/9 Disposition Plan:  SNF?  Consultants:   neurosurgery   IR  Procedures:     Antibiotics:    HPI/Subjective: Still with pain   Objective: Filed Vitals:   04/23/15 0621 04/23/15 0900  BP:  127/74  Pulse:    Temp: 98.9 F (37.2 C) 98.1 F (36.7 C)  Resp:      Intake/Output Summary (Last 24 hours) at 04/23/15 1231 Last data filed at 04/23/15 0900  Gross per 24 hour  Intake    360 ml  Output    428 ml  Net    -68 ml   Filed Weights   04/21/15 0445 04/22/15 0437 04/23/15 0438  Weight: 79.2 kg (174 lb 9.7 oz) 79.4 kg (175 lb 0.7 oz) 79.8 kg (175 lb 14.8 oz)    Exam:   General:   In bed. Somnolent and confused  Cardiovascular:  regular rate rhythm without murmurs gallops rubs  Respiratory:  clear to auscultation bilaterally without wheezes rhonchi or rales  Abdomen:  soft nontender  nondistended  Ext:  clubbing cyanosis or edema   Neurologic: Lower extremities strength 4+/5. Diminished deep tendon reflexes   musculoskeletal: Pain with rotation of right hip,   Basic Metabolic Panel:  Recent Labs Lab 04/18/15 0837 04/19/15 0535 04/20/15 0838 04/21/15 0251 04/23/15 0425  NA 137 133* 135 134* 132*  K 3.9 3.7 4.1 3.4* 3.7  CL 90* 93* 92* 92* 91*  CO2 37* 30 31 32 29  GLUCOSE 196* 271* 210* 244* 157*  BUN 68* 65* 59* 58* 77*  CREATININE 3.48* 3.22* 3.13* 3.00* 3.59*  CALCIUM 10.2 9.4 9.8 9.2 9.5   Liver Function Tests:  Recent Labs Lab 04/18/15 0837 04/19/15 0535  AST 95* 100*  ALT 74* 71*  ALKPHOS 56 56  BILITOT 0.6 1.0  PROT 6.8 6.2*  ALBUMIN 3.1* 2.9*    Recent Labs Lab 04/18/15 0837  LIPASE 26   No results for input(s): AMMONIA in the last 168 hours. CBC:  Recent Labs Lab 04/18/15 0837 04/19/15 0535 04/20/15 0838  WBC 7.6 6.2 10.3  NEUTROABS 4.1  --  7.6  HGB 13.6 11.6* 11.3*  HCT 40.2 34.7* 33.9*  MCV 102.3* 102.4* 102.4*  PLT 162 144* 141*   Cardiac Enzymes: No results for input(s): CKTOTAL, CKMB, CKMBINDEX, TROPONINI in the last 168 hours. BNP (last 3 results)  Recent Labs  09/04/14 1439 10/04/14 1241 02/27/15 0924  BNP  1383.6* 467.9* 325.4*    ProBNP (last 3 results)  Recent Labs  07/16/14 1213  PROBNP 460.0*    CBG:  Recent Labs Lab 04/22/15 1218 04/22/15 1632 04/22/15 2116 04/23/15 0604 04/23/15 1152  GLUCAP 288* 243* 163* 156* 213*    No results found for this or any previous visit (from the past 240 hour(s)).   Studies: No results found.  Scheduled Meds: . allopurinol  100 mg Oral BID  . amiodarone  200 mg Oral QHS  . ferrous sulfate  325 mg Oral QODAY  . heparin subcutaneous  5,000 Units Subcutaneous 3 times per day  . insulin aspart  0-9 Units Subcutaneous TID WC  . insulin aspart  12 Units Subcutaneous TID WC  . insulin glargine  14 Units Subcutaneous QHS  . isosorbide mononitrate   60 mg Oral Daily  . latanoprost  1 drop Both Eyes QHS  . methocarbamol  500 mg Oral TID  . metolazone  2.5 mg Oral Q7 days  . metoprolol tartrate  25 mg Oral Daily  . polyethylene glycol  17 g Oral BID  . polyvinyl alcohol  1 drop Both Eyes BID  . potassium chloride SA  20 mEq Oral Daily  . predniSONE  60 mg Oral Q breakfast  . tamsulosin  0.4 mg Oral QPC supper  . torsemide  80 mg Oral Daily   Continuous Infusions:    Time spent: 15 minutes  [facility]  [email or web address], password [facility] 04/23/2015, 12:31 PM

## 2015-04-24 DIAGNOSIS — R269 Unspecified abnormalities of gait and mobility: Secondary | ICD-10-CM | POA: Diagnosis not present

## 2015-04-24 DIAGNOSIS — N2889 Other specified disorders of kidney and ureter: Secondary | ICD-10-CM | POA: Diagnosis not present

## 2015-04-24 DIAGNOSIS — R338 Other retention of urine: Secondary | ICD-10-CM | POA: Diagnosis not present

## 2015-04-24 LAB — GLUCOSE, CAPILLARY
GLUCOSE-CAPILLARY: 208 mg/dL — AB (ref 65–99)
GLUCOSE-CAPILLARY: 200 mg/dL — AB (ref 65–99)
Glucose-Capillary: 138 mg/dL — ABNORMAL HIGH (ref 65–99)
Glucose-Capillary: 114 mg/dL — ABNORMAL HIGH (ref 65–99)

## 2015-04-24 MED ORDER — INSULIN ASPART 100 UNIT/ML ~~LOC~~ SOLN
15.0000 [IU] | Freq: Three times a day (TID) | SUBCUTANEOUS | Status: DC
  Administered 2015-04-24 – 2015-04-25 (×5): 15 [IU] via SUBCUTANEOUS

## 2015-04-24 MED ORDER — INSULIN GLARGINE 100 UNIT/ML ~~LOC~~ SOLN
16.0000 [IU] | Freq: Every day | SUBCUTANEOUS | Status: DC
  Administered 2015-04-24: 16 [IU] via SUBCUTANEOUS
  Filled 2015-04-24 (×2): qty 0.16

## 2015-04-24 NOTE — Plan of Care (Signed)
Problem: Metabolic: Goal: Ability to maintain appropriate glucose levels will improve Outcome: Progressing Changing insulin dosage to better maintain blood sugar per MD order

## 2015-04-24 NOTE — Progress Notes (Signed)
[facility] PROGRESS NOTE  [name], MD [id number] DOB: 1918-10-27 DOA: 04/20/2015 PCP: [name], MD  Assessment/Plan:  Principal Problem:   Back pain Active Problems:   Type 2 diabetes mellitus with peripheral neuropathy (HCC)   Paroxysmal atrial fibrillation, DCCV 06/16/13   Pacemaker - St Jude   Long term current use of amiodarone   CKD (chronic kidney disease) stage 4, GFR 15-29 ml/min (HCC)   Essential hypertension   Chronic diastolic heart failure (HCC)   Urinary retention due to benign prostatic hyperplasia   Chronic systolic CHF (congestive heart failure) (HCC)   SSS (sick sinus syndrome) (HCC)   Severe comorbid illness   Gait abnormality   Abnormality of gait  Pain is still severe, but seems to have better range of motion of the right hip each day and ambulating less difficult per caregiver. Continue pred. Hip injection Monday. Recommend SNF. Change robaxin to prn. More alert and interactive off Robaxin.   CBGs remain high. Increase insulin.  Still has Foley in place for urinary retention that proceeded the acute back pain. Has an appointment with urology on 12/14. Continue Foley for now, per their recommendations  Code Status:  full Family Communication:  Daughter, Jamesetta Orleans" [phone] 12/9. Caregiver at bedside Disposition Plan:  SNF?  Consultants:   neurosurgery   IR  Procedures:     Antibiotics:    HPI/Subjective: Complaining of back pain   Objective: Filed Vitals:   04/23/15 2028 04/24/15 0505  BP: 90/42 127/58  Pulse: 66 88  Temp: 97.2 F (36.2 C) 97 F (36.1 C)  Resp: 18 18    Intake/Output Summary (Last 24 hours) at 04/24/15 1137 Last data filed at 04/24/15 0952  Gross per 24 hour  Intake    120 ml  Output   1500 ml  Net  -1380 ml   Filed Weights   04/22/15 0437 04/23/15 0438 04/24/15 0505  Weight: 79.4 kg (175 lb 0.7 oz) 79.8 kg (175 lb 14.8 oz) 80.2 kg (176 lb 12.9 oz)    Exam:   General:   In  chair. Forgetful, but more appropriate today. Less somnolent.  Cardiovascular:  regular rate rhythm without murmurs gallops rubs  Respiratory:  clear to auscultation bilaterally without wheezes rhonchi or rales  Abdomen:  soft nontender nondistended  Ext:  clubbing cyanosis or edema   Neurologic: Lower extremities strength 4+/5. Diminished deep tendon reflexes   musculoskeletal: Pain with rotation of right hip, but appears less  Basic Metabolic Panel:  Recent Labs Lab 04/18/15 0837 04/19/15 0535 04/20/15 0838 04/21/15 0251 04/23/15 0425  NA 137 133* 135 134* 132*  K 3.9 3.7 4.1 3.4* 3.7  CL 90* 93* 92* 92* 91*  CO2 37* 30 31 32 29  GLUCOSE 196* 271* 210* 244* 157*  BUN 68* 65* 59* 58* 77*  CREATININE 3.48* 3.22* 3.13* 3.00* 3.59*  CALCIUM 10.2 9.4 9.8 9.2 9.5   Liver Function Tests:  Recent Labs Lab 04/18/15 0837 04/19/15 0535  AST 95* 100*  ALT 74* 71*  ALKPHOS 56 56  BILITOT 0.6 1.0  PROT 6.8 6.2*  ALBUMIN 3.1* 2.9*    Recent Labs Lab 04/18/15 0837  LIPASE 26   No results for input(s): AMMONIA in the last 168 hours. CBC:  Recent Labs Lab 04/18/15 0837 04/19/15 0535 04/20/15 0838  WBC 7.6 6.2 10.3  NEUTROABS 4.1  --  7.6  HGB 13.6 11.6* 11.3*  HCT 40.2 34.7* 33.9*  MCV 102.3* 102.4* 102.4*  PLT 162  144* 141*   Cardiac Enzymes: No results for input(s): CKTOTAL, CKMB, CKMBINDEX, TROPONINI in the last 168 hours. BNP (last 3 results)  Recent Labs  09/04/14 1439 10/04/14 1241 02/27/15 0924  BNP 1383.6* 467.9* 325.4*    ProBNP (last 3 results)  Recent Labs  07/16/14 1213  PROBNP 460.0*    CBG:  Recent Labs Lab 04/23/15 0604 04/23/15 1152 04/23/15 1657 04/23/15 2046 04/24/15 0609  GLUCAP 156* 213* 303* 323* 208*    No results found for this or any previous visit (from the past 240 hour(s)).   Studies: No results found.  Scheduled Meds: . acetaminophen  650 mg Oral TID  . allopurinol  100 mg Oral BID  . amiodarone  200  mg Oral QHS  . ferrous sulfate  325 mg Oral QODAY  . heparin subcutaneous  5,000 Units Subcutaneous 3 times per day  . insulin aspart  0-9 Units Subcutaneous TID WC  . insulin aspart  15 Units Subcutaneous TID WC  . insulin glargine  16 Units Subcutaneous QHS  . isosorbide mononitrate  60 mg Oral Daily  . latanoprost  1 drop Both Eyes QHS  . metolazone  2.5 mg Oral Q7 days  . metoprolol tartrate  25 mg Oral Daily  . polyethylene glycol  17 g Oral BID  . polyvinyl alcohol  1 drop Both Eyes BID  . potassium chloride SA  20 mEq Oral Daily  . predniSONE  60 mg Oral Q breakfast  . tamsulosin  0.4 mg Oral QPC supper  . torsemide  80 mg Oral Daily   Continuous Infusions:    Time spent: 15 minutes  [facility]  [email or web address], password [facility] 04/24/2015, 11:37 AM

## 2015-04-25 ENCOUNTER — Observation Stay (HOSPITAL_COMMUNITY): Payer: Medicare Other

## 2015-04-25 DIAGNOSIS — N2889 Other specified disorders of kidney and ureter: Secondary | ICD-10-CM

## 2015-04-25 DIAGNOSIS — M169 Osteoarthritis of hip, unspecified: Secondary | ICD-10-CM | POA: Diagnosis present

## 2015-04-25 DIAGNOSIS — I480 Paroxysmal atrial fibrillation: Secondary | ICD-10-CM | POA: Diagnosis not present

## 2015-04-25 DIAGNOSIS — R269 Unspecified abnormalities of gait and mobility: Secondary | ICD-10-CM

## 2015-04-25 DIAGNOSIS — D696 Thrombocytopenia, unspecified: Secondary | ICD-10-CM

## 2015-04-25 DIAGNOSIS — R5381 Other malaise: Secondary | ICD-10-CM

## 2015-04-25 DIAGNOSIS — R338 Other retention of urine: Secondary | ICD-10-CM

## 2015-04-25 DIAGNOSIS — I5022 Chronic systolic (congestive) heart failure: Secondary | ICD-10-CM

## 2015-04-25 DIAGNOSIS — E1142 Type 2 diabetes mellitus with diabetic polyneuropathy: Secondary | ICD-10-CM

## 2015-04-25 DIAGNOSIS — E871 Hypo-osmolality and hyponatremia: Secondary | ICD-10-CM

## 2015-04-25 DIAGNOSIS — N184 Chronic kidney disease, stage 4 (severe): Secondary | ICD-10-CM

## 2015-04-25 DIAGNOSIS — I5032 Chronic diastolic (congestive) heart failure: Secondary | ICD-10-CM

## 2015-04-25 DIAGNOSIS — I10 Essential (primary) hypertension: Secondary | ICD-10-CM

## 2015-04-25 DIAGNOSIS — N400 Enlarged prostate without lower urinary tract symptoms: Secondary | ICD-10-CM

## 2015-04-25 DIAGNOSIS — M1000 Idiopathic gout, unspecified site: Secondary | ICD-10-CM

## 2015-04-25 DIAGNOSIS — M5440 Lumbago with sciatica, unspecified side: Secondary | ICD-10-CM

## 2015-04-25 LAB — BASIC METABOLIC PANEL
Anion gap: 13 (ref 5–15)
BUN: 94 mg/dL — AB (ref 6–20)
CHLORIDE: 87 mmol/L — AB (ref 101–111)
CO2: 30 mmol/L (ref 22–32)
CREATININE: 3.62 mg/dL — AB (ref 0.61–1.24)
Calcium: 9.5 mg/dL (ref 8.9–10.3)
GFR calc Af Amer: 15 mL/min — ABNORMAL LOW (ref >60)
GFR calc non Af Amer: 13 mL/min — ABNORMAL LOW (ref >60)
GLUCOSE: 166 mg/dL — AB (ref 65–99)
Potassium: 3.9 mmol/L (ref 3.5–5.1)
SODIUM: 130 mmol/L — AB (ref 135–145)

## 2015-04-25 LAB — GLUCOSE, CAPILLARY
GLUCOSE-CAPILLARY: 164 mg/dL — AB (ref 65–99)
GLUCOSE-CAPILLARY: 178 mg/dL — AB (ref 65–99)
GLUCOSE-CAPILLARY: 280 mg/dL — AB (ref 65–99)
GLUCOSE-CAPILLARY: 271 mg/dL — AB (ref 65–99)
GLUCOSE-CAPILLARY: 223 mg/dL — AB (ref 65–99)

## 2015-04-25 MED ORDER — INSULIN GLARGINE 100 UNIT/ML ~~LOC~~ SOLN
10.0000 [IU] | Freq: Every day | SUBCUTANEOUS | Status: DC
  Administered 2015-04-25: 10 [IU] via SUBCUTANEOUS
  Filled 2015-04-25 (×2): qty 0.1

## 2015-04-25 MED ORDER — APIXABAN 2.5 MG PO TABS
2.5000 mg | ORAL_TABLET | Freq: Two times a day (BID) | ORAL | Status: DC
  Administered 2015-04-25 – 2015-04-26 (×2): 2.5 mg via ORAL
  Filled 2015-04-25 (×3): qty 1

## 2015-04-25 MED ORDER — INSULIN ASPART 100 UNIT/ML ~~LOC~~ SOLN
10.0000 [IU] | Freq: Three times a day (TID) | SUBCUTANEOUS | Status: DC
  Administered 2015-04-25 – 2015-04-26 (×3): 10 [IU] via SUBCUTANEOUS

## 2015-04-25 MED ORDER — IOHEXOL 180 MG/ML  SOLN
20.0000 mL | Freq: Once | INTRAMUSCULAR | Status: AC | PRN
  Administered 2015-04-25: 10 mL via INTRA_ARTICULAR

## 2015-04-25 MED ORDER — APIXABAN 2.5 MG PO TABS: 2.5000 mg | ORAL_TABLET | Freq: Two times a day (BID) | ORAL | Status: DC

## 2015-04-25 MED ORDER — METHYLPREDNISOLONE ACETATE 40 MG/ML INJ SUSP (RADIOLOG
40.0000 mg | Freq: Once | INTRAMUSCULAR | Status: AC
  Administered 2015-04-25: 40 mg via INTRA_ARTICULAR

## 2015-04-25 MED ORDER — METHYLPREDNISOLONE ACETATE 40 MG/ML INJ SUSP (RADIOLOG
80.0000 mg | Freq: Once | INTRAMUSCULAR | Status: AC
  Administered 2015-04-25: 80 mg via INTRA_ARTICULAR

## 2015-04-25 NOTE — ED Provider Notes (Signed)
CSN: [id number]     Arrival date & time 04/20/15  3419 History   First MD Initiated Contact with Patient 04/20/15 [phone]     Chief Complaint  Patient presents with  . Back Pain     (Consider location/radiation/quality/duration/timing/severity/associated sxs/prior Treatment) HPI Patient states he has severe central lower back pain. He identifies as having started during his hospitalization while being moved to the CT scanner. He is unsure if there was any injury associated or if this is related to other chronic back pain but much more severe. He reports that his legs are hurting as well. He has been unable to walk due to the degree of pain. He denies new onset of abdominal pain, fever or urinary symptoms. Past Medical History  Diagnosis Date  . Hyperlipidemia   . Spinal stenosis   . Anemia   . Carotid artery disease ([place])     a. s/p prior carotid endarterectomy.  [name] PVD (peripheral vascular disease) ([place])   . Hyponatremia     Chronic  . CVA (cerebral infarction)     a. When asked to clarify patient says he was told thumb numbness may be TIA. He does not recall formal stroke. CT head results are only available through [facility], not in [facility].  . CKD (chronic kidney disease) stage 4, GFR 15-29 ml/min (HCC)   . Long term (current) use of anticoagulants   . PAF (paroxysmal atrial fibrillation) ([place])     a. Asymptomatic. On Eliquis. CHADSVASC 7.  . Sick sinus syndrome ([place])     With DDD St. Jude PM, initially placed in 1993 by Dr. Nils Pyle. Device upgrade 10/2002 to DDD, by Dr. Rollene Fare complicated by bleeding. Subsequent gen change 2011.  [name] Hypertensive cardiovascular disease   . Chronic diastolic congestive heart failure ([place])     ECHO 05/20/12 LVEF estimated by 2D at 55-60%  . Arthritis   . Gait disorder   . Diabetic peripheral neuropathy associated with type 2 diabetes mellitus ([place])   . Diabetes mellitus (HCC)     insulin dependent  . Elevated troponin I level 06/22/2014    a. 06/2014 felt due  to demand ischemia.  . Chronic anticoagulation     For PAF, on Eliquis   . Urinary retention   . Pulmonary hypertension ([place])   . Foot drop, left 07/21/2014  . Asterixis 07/21/2014  . Hypertension   . Presence of permanent cardiac pacemaker   . Pneumonia     hx of pneumonia x 1   . Bilateral plantar fasciitis    Past Surgical History  Procedure Laterality Date  . Lumbar laminectomy  1995  . Back surgery    . Total knee arthroplasty Left   . Quadriceps tendon repair    . Cataract extraction    . Carotid endarterectomy    . Yag laser application Right 9/79/8921    Procedure: YAG LASER CAPSULOTOMY OF RIGHT EYE;  Surgeon: [name], MD;  Location: [facility];  Service: Ophthalmology;  Laterality: Right;  . Tonsillectomy    . Pacemaker insertion      DDD St. Jude PM, initially placed in 1993 by Dr. Nils Pyle. Device upgrade 10/2002 to DDD, by Dr. Rollene Fare complicated by bleeding.  . Carotid endarterectomy Right 2006  . Transurethral resection of prostate    . Cardioversion N/A 06/16/2013    Procedure: CARDIOVERSION BEDSIDE;  Surgeon: [name], MD;  Location: [facility];  Service: Cardiovascular;  Laterality: N/A;  . Pacemaker generator change  12/09/2009    SJM Accent DR RF gen change by Dr [name]  . Insert / replace / remove pacemaker      2004   . Transurethral resection of prostate N/A 08/27/2014    Procedure: TRANSURETHRAL RESECTION OF THE PROSTATE WITH GYRUS INSTRUMENTS;  Surgeon: [name], MD;  Location: [facility];  Service: Urology;  Laterality: N/A;  . Cystoscopy N/A 08/27/2014    Procedure: CYSTOSCOPY;  Surgeon: [name], MD;  Location: [facility];  Service: Urology;  Laterality: N/A;   Family History  Problem Relation Age of Onset  . Multiple myeloma Father   . Hypertension Mother   . Healthy Sister   . COPD Sister   . Heart failure Sister    Social History  Substance Use Topics  . Smoking status: Former Smoker -- 2 years    Types: Pipe, Cigarettes    Quit date:  05/14/1958  . Smokeless tobacco: Never Used  . Alcohol Use: Yes     Comment: Cocktails occasionally    Review of Systems  10 Systems reviewed and are negative for acute change except as noted in the HPI.   Allergies  Statins; Aggrenox; Carvedilol; Claritin; Cymbalta; Latanoprost; Lyrica; Mucinex; Other; Phenergan; Plavix; Rapaflo; and Travatan z  Home Medications   Prior to Admission medications   Medication Sig Start Date End Date Taking? Authorizing Provider  allopurinol (ZYLOPRIM) 100 MG tablet Take 100 mg by mouth 2 (two) times daily.    Yes Historical Provider, MD  amiodarone (PACERONE) 200 MG tablet Take 1 tablet (200 mg total) by mouth at bedtime. 09/17/14  Yes Belva Crome, MD  apixaban (ELIQUIS) 2.5 MG TABS tablet Take 1 tablet (2.5 mg total) by mouth 2 (two) times daily. 12/27/14  Yes Belva Crome, MD  Carboxymethylcellul-Glycerin 0.5-0.9 % SOLN Place 1 drop into both eyes 2 (two) times daily.    Yes Historical Provider, MD  ferrous sulfate 325 (65 FE) MG tablet Take 325 mg by mouth every other day.   Yes Historical Provider, MD  insulin aspart (NOVOLOG FLEXPEN) 100 UNIT/ML FlexPen Inject 9 Units into the skin 3 (three) times daily with meals. Per sliding scale:  CBG <175 no insulin (unless consuming large amounts of carbs; 6-10 units for CBG >175 based on actual CBG and number of carbs in meal; >325 call MD   Yes Historical Provider, MD  insulin glargine (LANTUS) 100 UNIT/ML injection Inject 9 Units into the skin at bedtime. Or as directed by sliding scale.   Yes Historical Provider, MD  isosorbide mononitrate (IMDUR) 60 MG 24 hr tablet Take 1 tablet (60 mg total) by mouth daily. 03/07/15  Yes Belva Crome, MD  metolazone (ZAROXOLYN) 2.5 MG tablet Take 2.5 mg by mouth every 7 (seven) days. Take one (1) tablet (2.5 mg total) by mouth 30 minutes before Torsemide on Wednesdays   Yes Historical Provider, MD  metoprolol tartrate (LOPRESSOR) 25 MG tablet Take 1 tablet (25 mg total) by  mouth daily. 02/28/15  Yes Belva Crome, MD  nitroGLYCERIN (NITROSTAT) 0.4 MG SL tablet Place 1 tablet (0.4 mg total) under the tongue every 5 (five) minutes as needed for chest pain. 12/03/13  Yes Belva Crome, MD  OVER THE COUNTER MEDICATION Take 1 packet by mouth daily. Nature Made Diabetic Pak (6 tablet combo)   Yes Historical Provider, MD  polyethylene glycol (MIRALAX / GLYCOLAX) packet Take 17 g by mouth 2 (two) times daily.    Yes Historical Provider, MD  potassium  chloride SA (K-DUR,KLOR-CON) 20 MEQ tablet Take 1 tablet (20 mEq total) by mouth daily. 09/08/14  Yes Debbe Odea, MD  Tafluprost 0.0015 % SOLN Place 1 drop into both eyes at bedtime. ZIOPTAN   Yes Historical Provider, MD  tamsulosin (FLOMAX) 0.4 MG CAPS capsule Take 1 capsule (0.4 mg total) by mouth daily after supper. 04/19/15  Yes Janece Canterbury, MD  torsemide (DEMADEX) 20 MG tablet Take 80 mg by mouth daily. 02/25/15  Yes Historical Provider, MD   BP 115/55 mmHg  Pulse 60  Temp(Src) 97.5 F (36.4 C) (Oral)  Resp 18  Ht _0  (1.626 m)  Wt 175 lb 4.8 oz (79.516 kg)  BMI 30.08 kg/m2  SpO2 98% Physical Exam  Constitutional: He appears well-developed and well-nourished.  Patient appears to be in moderate to severe pain. He is alert and appropriate. No respiratory distress.  HENT:  Head: Normocephalic and atraumatic.  Eyes: EOM are normal. Pupils are equal, round, and reactive to light.  Neck: Neck supple.  Cardiovascular: Normal rate, regular rhythm, normal heart sounds and intact distal pulses.   Pulmonary/Chest: Effort normal and breath sounds normal.  Abdominal: Soft. Bowel sounds are normal. He exhibits no distension. There is no tenderness.  Musculoskeletal: Normal range of motion. He exhibits tenderness. He exhibits no edema.  Significant back pain with attempt at position changing. Patient is rolled onto his side. There is some reproducible central bony tenderness but no step-off. No CVA tenderness.   Neurological: He is alert. He has normal strength. He exhibits normal muscle tone. Coordination normal. GCS eye subscore is 4. GCS verbal subscore is 5. GCS motor subscore is 6.  Skin: Skin is warm, dry and intact.  Psychiatric: He has a normal mood and affect.    ED Course  Procedures (including critical care time) Labs Review Labs Reviewed  BASIC METABOLIC PANEL - Abnormal; Notable for the following:    Chloride 92 (*)    Glucose, Bld 210 (*)    BUN 59 (*)    Creatinine, Ser 3.13 (*)    GFR calc non Af Amer 16 (*)    GFR calc Af Amer 18 (*)    All other components within normal limits  CBC WITH DIFFERENTIAL/PLATELET - Abnormal; Notable for the following:    RBC 3.31 (*)    Hemoglobin 11.3 (*)    HCT 33.9 (*)    MCV 102.4 (*)    MCH 34.1 (*)    RDW 15.6 (*)    Platelets 141 (*)    Monocytes Absolute 1.1 (*)    All other components within normal limits  URINALYSIS, ROUTINE W REFLEX MICROSCOPIC (NOT AT [facility] - Abnormal; Notable for the following:    Hgb urine dipstick MODERATE (*)    Leukocytes, UA MODERATE (*)    All other components within normal limits  GLUCOSE, CAPILLARY - Abnormal; Notable for the following:    Glucose-Capillary 251 (*)    All other components within normal limits  BASIC METABOLIC PANEL - Abnormal; Notable for the following:    Sodium 134 (*)    Potassium 3.4 (*)    Chloride 92 (*)    Glucose, Bld 244 (*)    BUN 58 (*)    Creatinine, Ser 3.00 (*)    GFR calc non Af Amer 16 (*)    GFR calc Af Amer 19 (*)    All other components within normal limits  GLUCOSE, CAPILLARY - Abnormal; Notable for the following:    Glucose-Capillary 248 (*)  All other components within normal limits  GLUCOSE, CAPILLARY - Abnormal; Notable for the following:    Glucose-Capillary 240 (*)    All other components within normal limits  GLUCOSE, CAPILLARY - Abnormal; Notable for the following:    Glucose-Capillary 296 (*)    All other components within normal limits   GLUCOSE, CAPILLARY - Abnormal; Notable for the following:    Glucose-Capillary 269 (*)    All other components within normal limits  GLUCOSE, CAPILLARY - Abnormal; Notable for the following:    Glucose-Capillary 260 (*)    All other components within normal limits  GLUCOSE, CAPILLARY - Abnormal; Notable for the following:    Glucose-Capillary 225 (*)    All other components within normal limits  GLUCOSE, CAPILLARY - Abnormal; Notable for the following:    Glucose-Capillary 288 (*)    All other components within normal limits  BASIC METABOLIC PANEL - Abnormal; Notable for the following:    Sodium 132 (*)    Chloride 91 (*)    Glucose, Bld 157 (*)    BUN 77 (*)    Creatinine, Ser 3.59 (*)    GFR calc non Af Amer 13 (*)    GFR calc Af Amer 15 (*)    All other components within normal limits  GLUCOSE, CAPILLARY - Abnormal; Notable for the following:    Glucose-Capillary 243 (*)    All other components within normal limits  GLUCOSE, CAPILLARY - Abnormal; Notable for the following:    Glucose-Capillary 163 (*)    All other components within normal limits  GLUCOSE, CAPILLARY - Abnormal; Notable for the following:    Glucose-Capillary 156 (*)    All other components within normal limits  GLUCOSE, CAPILLARY - Abnormal; Notable for the following:    Glucose-Capillary 213 (*)    All other components within normal limits  GLUCOSE, CAPILLARY - Abnormal; Notable for the following:    Glucose-Capillary 303 (*)    All other components within normal limits  GLUCOSE, CAPILLARY - Abnormal; Notable for the following:    Glucose-Capillary 323 (*)    All other components within normal limits  GLUCOSE, CAPILLARY - Abnormal; Notable for the following:    Glucose-Capillary 208 (*)    All other components within normal limits  GLUCOSE, CAPILLARY - Abnormal; Notable for the following:    Glucose-Capillary 200 (*)    All other components within normal limits  GLUCOSE, CAPILLARY - Abnormal; Notable  for the following:    Glucose-Capillary 138 (*)    All other components within normal limits  BASIC METABOLIC PANEL - Abnormal; Notable for the following:    Sodium 130 (*)    Chloride 87 (*)    Glucose, Bld 166 (*)    BUN 94 (*)    Creatinine, Ser 3.62 (*)    GFR calc non Af Amer 13 (*)    GFR calc Af Amer 15 (*)    All other components within normal limits  GLUCOSE, CAPILLARY - Abnormal; Notable for the following:    Glucose-Capillary 114 (*)    All other components within normal limits  GLUCOSE, CAPILLARY - Abnormal; Notable for the following:    Glucose-Capillary 164 (*)    All other components within normal limits  GLUCOSE, CAPILLARY - Abnormal; Notable for the following:    Glucose-Capillary 178 (*)    All other components within normal limits  GLUCOSE, CAPILLARY - Abnormal; Notable for the following:    Glucose-Capillary 280 (*)    All other  components within normal limits  CBG MONITORING, ED - Abnormal; Notable for the following:    Glucose-Capillary 209 (*)    All other components within normal limits  URINE MICROSCOPIC-ADD ON  BASIC METABOLIC PANEL    Imaging Review Dg Fluoro Guide Ndl Plcd/bx/inj/loc  04/25/2015  CLINICAL DATA:  Right hip pain. EXAM: RIGHT HIP INJECTION UNDER FLUOROSCOPY FLUOROSCOPY TIME:  Fluoroscopy Time (in minutes and seconds): 1 MINUTE 48 SECONDS PROCEDURE: Informed consent was obtained from Dr. Ellsworth Lennox and his accompanying friend. Dr. Ellsworth Lennox reported that his friend has power of attorney for him. Overlying skin prepped with Betadine, draped in the usual sterile fashion, and infiltrated locally with buffered Lidocaine. Curved 20 gauge spinal needle advanced to the superolateral margin of the right femoral head. 1 ml of Lidocaine injected easily. Diagnostic injection of iodinated contrast demonstrates intra-articular spread without intravascular component. 167m Depo-Medrol and 5 ml Sensorcaine 0.5% were then administered. No immediate complication.  IMPRESSION: Technically successful right hip injection under fluoroscopy. Dr. BKennon Holtertolerated the procedure well. Electronically Signed   By: JLorriane ShireM.D.   On: 04/25/2015 13:02   I have personally reviewed and evaluated these images and lab results as part of my medical decision-making.   EKG Interpretation None      MDM   Final diagnoses:  Intractable back pain  Neurologic gait dysfunction  Severe comorbid illness   Patient presents with severe back pain. Pain medication was helpful in controlling pain however he did feel that he was still unable to function at baseline with mobility. At this time patient will be placed in the hospital for ongoing pain control and OT PT assessment for home function.    MCharlesetta Shanks MD 04/25/15 [phone]

## 2015-04-25 NOTE — Progress Notes (Addendum)
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 CT of lumbar spine showed no fracture. Dr. Vertell Limber was consulted and felt much of her pain was likely related to osteoarthritis of both hips and recommended steroids, which would help back and hip pain, and also recommended steroid injection of the right hip. And liquids has been held in anticipation of procedure. Insulin has been adjusted while on prednisone. Physical therapy consulted and recommending inpatient rehabilitation versus skilled nursing facility.  Assessment/Plan:  Principal Problem:   Back pain/DJD: Improving on steroids. Cannot get MRI due to pacemaker. Seen by neurosurgery who felt more at the pain was related to hip osteoarthritis. As symptoms improving and patient received intra-articular hip injection today, will stop steroids and monitor. Robaxin changed to as needed due to altered mental status. Will resume Eliquis. PT recommending CIR. Have consulted rehab. If not, strongly recommend SNF, though unclear whether patient will agree.  OA hips with right greater than left pain: Has better range of motion today post prednisone and steroid injection. Will stop prednisone. See above.    CKD (chronic kidney disease) stage 4 with AKI, GFR 15-29 ml/min ([place]): Baseline creatinine about 3. Seems to be trending upward, BUN elevated as well. May in part be due to steroids, but will hold diuretics today. Please resume diuretics when appropriate    Type 2 diabetes mellitus with peripheral neuropathy ([place]): Will decrease Levemir and NovoLog now that prednisone will be stopped.    Paroxysmal atrial fibrillation, resume Eliquis    Pacemaker - [employer]    Essential hypertension    Chronic diastolic heart  failure [facility]): see above    Urinary retention due to benign prostatic hyperplasia: preceded back painhas foley in until f/u with urology scheduled 12/14. I gave patient option of voiding trial before then but he refuses at this time.    SSS (sick sinus syndrome) (HCC)    Gait abnormality  Code Status:  full Family Communication:  Daughter, Chauncey Reading, "G" [phone] 12/12. Caregiver at bedside Disposition Plan:  SNF v CIR when disposition clarified  Consultants:   neurosurgery   IR  PM&R  Procedures:   Right hip injection  Antibiotics:    HPI/Subjective: Pain better today.   Objective: Filed Vitals:   04/25/15 0554 04/25/15 1220  BP: 123/64 115/55  Pulse: 68 60  Temp: 97.2 F (36.2 C) 97.5 F (36.4 C)  Resp: 20 18    Intake/Output Summary (Last 24 hours) at 04/25/15 1508 Last data filed at 04/25/15 1308  Gross per 24 hour  Intake    240 ml  Output   1876 ml  Net  -1636 ml   Filed Weights   04/23/15 0438 04/24/15 0505 04/25/15 0554  Weight: 79.8 kg (175 lb 14.8 oz) 80.2 kg (176 lb 12.9 oz) 79.516 kg (175 lb 4.8 oz)    Exam:   General:   In chair. Forgetful, but appropriate  Cardiovascular:  regular rate rhythm without murmurs gallops rubs  Respiratory:  clear to auscultation bilaterally without wheezes rhonchi or rales  Abdomen:  soft nontender nondistended  Ext:  No clubbing cyanosis or edema   musculoskeletal: Range of motion much improved bilateral hips.  Basic Metabolic Panel:  Recent Labs Lab 04/19/15 0535 04/20/15 [id number] 04/21/15 0251 04/23/15 0425 04/25/15 0430  NA 133* 135 134* 132* 130*  K 3.7 4.1 3.4* 3.7 3.9  CL 93* 92* 92* 91* 87*  CO2 30 31 32 29 30  GLUCOSE 271* 210* 244* 157* 166*  BUN 65* 59* 58* 77* 94*  CREATININE 3.22* 3.13* 3.00* 3.59* 3.62*  CALCIUM 9.4 9.8 9.2 9.5 9.5   Liver Function Tests:  Recent Labs Lab 04/19/15 0535  AST 100*  ALT 71*  ALKPHOS 56  BILITOT 1.0  PROT 6.2*  ALBUMIN 2.9*   No results  for input(s): LIPASE, AMYLASE in the last 168 hours. No results for input(s): AMMONIA in the last 168 hours. CBC:  Recent Labs Lab 04/19/15 0535 04/20/15 0838  WBC 6.2 10.3  NEUTROABS  --  7.6  HGB 11.6* 11.3*  HCT 34.7* 33.9*  MCV 102.4* 102.4*  PLT 144* 141*   Cardiac Enzymes: No results for input(s): CKTOTAL, CKMB, CKMBINDEX, TROPONINI in the last 168 hours. BNP (last 3 results)  Recent Labs  09/04/14 1439 10/04/14 1241 02/27/15 0924  BNP 1383.6* 467.9* 325.4*    ProBNP (last 3 results)  Recent Labs  07/16/14 1213  PROBNP 460.0*    CBG:  Recent Labs Lab 04/24/15 1650 04/24/15 2127 04/25/15 0644 04/25/15 1220 04/25/15 1359  GLUCAP 138* 114* 164* 178* 280*    No results found for this or any previous visit (from the past 240 hour(s)).   Studies: Dg Fluoro Guide Ndl Plcd/bx/inj/loc  04/25/2015  CLINICAL DATA:  Right hip pain. EXAM: RIGHT HIP INJECTION UNDER FLUOROSCOPY FLUOROSCOPY TIME:  Fluoroscopy Time (in minutes and seconds): 1 MINUTE 48 SECONDS PROCEDURE: Informed consent was obtained from Dr. Ellsworth Lennox and his accompanying friend. Dr. Ellsworth Lennox reported that his friend has power of attorney for him. Overlying skin prepped with Betadine, draped in the usual sterile fashion, and infiltrated locally with buffered Lidocaine. Curved 20 gauge spinal needle advanced to the superolateral margin of the right femoral head. 1 ml of Lidocaine injected easily. Diagnostic injection of iodinated contrast demonstrates intra-articular spread without intravascular component. 120mg  Depo-Medrol and 5 ml Sensorcaine 0.5% were then administered. No immediate complication. IMPRESSION: Technically successful right hip injection under fluoroscopy. Dr. Kennon Holter tolerated the procedure well. Electronically Signed   By: Lorriane Shire M.D.   On: 04/25/2015 13:02    Scheduled Meds: . acetaminophen  650 mg Oral TID  . allopurinol  100 mg Oral BID  . amiodarone  200 mg Oral QHS  . ferrous  sulfate  325 mg Oral QODAY  . insulin aspart  0-9 Units Subcutaneous TID WC  . insulin aspart  15 Units Subcutaneous TID WC  . insulin glargine  16 Units Subcutaneous QHS  . isosorbide mononitrate  60 mg Oral Daily  . latanoprost  1 drop Both Eyes QHS  . metolazone  2.5 mg Oral Q7 days  . metoprolol tartrate  25 mg Oral Daily  . polyethylene glycol  17 g Oral BID  . polyvinyl alcohol  1 drop Both Eyes BID  . potassium chloride SA  20 mEq Oral Daily  . predniSONE  60 mg Oral Q breakfast  . tamsulosin  0.4 mg Oral QPC supper  . torsemide  80 mg Oral Daily   Continuous Infusions:    Time spent: 25 minutes  [facility]  [email or web address], password [facility] 04/25/2015, 3:08 PM

## 2015-04-25 NOTE — Consult Note (Signed)
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 Well known to rehabilitation services from admission August 2015 for debilitation related to multiple medical issues. Patient lives with family has a personal care attendant 24 hours a day and ambulated with a Rollator walker walking short distances. He lives in a one level home with 3 steps to entry. For his urinary retention, he was started on Flomax. Presented 04/20/2015 with increasing low back pain and noted history of back surgery. CT lumbar spine showed good position of hardware. No acute compression fracture or posttraumatic subluxation noted. Also with complaints of right hip pain and underwent Depo-Medrol injection 04/25/2015. The severity of the pain was a 10/10.  He denies any other associated symptoms.  Hospital course complicated by pain management. Physical therapy evaluation completed ongoing with recommendations of physical medicine rehabilitation consult.  Review of Systems  Constitutional: Negative for fever and chills.  HENT: Positive for hearing loss.   Eyes: Negative for blurred vision and double vision.  Respiratory: Positive for shortness of breath. Negative for cough.   Cardiovascular: Positive for leg swelling.  Gastrointestinal: Positive for constipation. Negative for nausea and vomiting.  Genitourinary: Positive for flank pain.       Urinary retention  Musculoskeletal: Positive for myalgias, back pain and joint pain.  Skin: Negative for rash.  Neurological: Positive for weakness. Negative for headaches.  All other systems reviewed and are negative.  Past Medical History  Diagnosis Date  .  Hyperlipidemia   . Spinal stenosis   . Anemia   . Carotid artery disease ([place])     a. s/p prior carotid endarterectomy.  [name] PVD (peripheral vascular disease) ([place])   . Hyponatremia     Chronic  . CVA (cerebral infarction)     a. When asked to clarify patient says he was told thumb numbness may be TIA. He does not recall formal stroke. CT head results are only available through [facility], not in [facility].  . CKD (chronic kidney disease) stage 4, GFR 15-29 ml/min (HCC)   . Long term (current) use of anticoagulants   . PAF (paroxysmal atrial fibrillation) ([place])     a. Asymptomatic. On Eliquis. CHADSVASC 7.  . Sick sinus syndrome ([place])     With DDD St. Jude PM, initially placed in 1993 by Dr. Nils Pyle. Device upgrade 10/2002 to DDD, by Dr. Rollene Fare complicated by bleeding. Subsequent gen change 2011.  [name] Hypertensive cardiovascular disease   . Chronic diastolic congestive heart failure ([place])     ECHO 05/20/12 LVEF estimated by 2D at 55-60%  . Arthritis   . Gait disorder   . Diabetic peripheral neuropathy associated with type 2 diabetes mellitus ([place])   . Diabetes mellitus (HCC)     insulin dependent  . Elevated troponin I level 06/22/2014    a. 06/2014 felt due to demand ischemia.  . Chronic anticoagulation     For PAF, on Eliquis   . Urinary retention   . Pulmonary hypertension ([place])   . Foot drop, left 07/21/2014  . Asterixis 07/21/2014  . Hypertension   . Presence of permanent cardiac pacemaker   . Pneumonia     hx of pneumonia x 1   . Bilateral plantar  fasciitis    Past Surgical History  Procedure Laterality Date  . Lumbar laminectomy  1995  . Back surgery    . Total knee arthroplasty Left   . Quadriceps tendon repair    . Cataract extraction    . Carotid endarterectomy    . Yag laser application Right 7/35/3299    Procedure: YAG LASER CAPSULOTOMY OF RIGHT EYE;  Surgeon: [name], MD;  Location: [facility];  Service: Ophthalmology;  Laterality: Right;  . Tonsillectomy    .  Pacemaker insertion      DDD St. Jude PM, initially placed in 1993 by Dr. Nils Pyle. Device upgrade 10/2002 to DDD, by Dr. Rollene Fare complicated by bleeding.  . Carotid endarterectomy Right 2006  . Transurethral resection of prostate    . Cardioversion N/A 06/16/2013    Procedure: CARDIOVERSION BEDSIDE;  Surgeon: [name], MD;  Location: [facility];  Service: Cardiovascular;  Laterality: N/A;  . Pacemaker generator change  12/09/2009    SJM Accent DR RF gen change by Dr [name]  . Insert / replace / remove pacemaker      2004   . Transurethral resection of prostate N/A 08/27/2014    Procedure: TRANSURETHRAL RESECTION OF THE PROSTATE WITH GYRUS INSTRUMENTS;  Surgeon: [name], MD;  Location: [facility];  Service: Urology;  Laterality: N/A;  . Cystoscopy N/A 08/27/2014    Procedure: CYSTOSCOPY;  Surgeon: [name], MD;  Location: [facility];  Service: Urology;  Laterality: N/A;   Family History  Problem Relation Age of Onset  . Multiple myeloma Father   . Hypertension Mother   . Healthy Sister   . COPD Sister   . Heart failure Sister    Social History:  reports that he quit smoking about 56 years ago. His smoking use included Pipe and Cigarettes. He quit after 2 years of use. He has never used smokeless tobacco. He reports that he drinks alcohol. He reports that he does not use illicit drugs. Allergies:  Allergies  Allergen Reactions  . Statins Other (See Comments)    rhabdomyolisis  . Aggrenox [Aspirin-Dipyridamole Er] Other (See Comments)    Dizziness  . Carvedilol Other (See Comments)    Dizziness  . Claritin [Loratadine] Other (See Comments)    Dizziness & drowsiness   . Cymbalta [Duloxetine Hcl]     Confusion   . Latanoprost Itching    Watery   . Lyrica [Pregabalin] Other (See Comments)    Makes him dizzy. Takes him days to recover.  . Mucinex [Guaifenesin Er] Other (See Comments)    Dizziness, drowsiness  . Other Other (See Comments)    Preservatives in glaucoma gtts cause eye  irritation, has to use preservative free  . Phenergan [Promethazine Hcl] Other (See Comments)    Causes severe confusion  . Plavix [Clopidogrel Bisulfate] Other (See Comments)    Dizziness  . Rapaflo [Silodosin] Other (See Comments)    Dizziness for about 30- 45  Minutes At time of preop appointment on 08/26/2014 patient denies any problems with this medication  . Travatan Z [Travoprost] Other (See Comments)    Eye irritation   Medications Prior to Admission  Medication Sig Dispense Refill  . allopurinol (ZYLOPRIM) 100 MG tablet Take 100 mg by mouth 2 (two) times daily.     [name] amiodarone (PACERONE) 200 MG tablet Take 1 tablet (200 mg total) by mouth at bedtime. 30 tablet 6  . apixaban (ELIQUIS) 2.5 MG TABS tablet Take 1 tablet (2.5 mg  total) by mouth 2 (two) times daily. 60 tablet 11  . Carboxymethylcellul-Glycerin 0.5-0.9 % SOLN Place 1 drop into both eyes 2 (two) times daily.     . ferrous sulfate 325 (65 FE) MG tablet Take 325 mg by mouth every other day.    . insulin aspart (NOVOLOG FLEXPEN) 100 UNIT/ML FlexPen Inject 9 Units into the skin 3 (three) times daily with meals. Per sliding scale:  CBG <175 no insulin (unless consuming large amounts of carbs; 6-10 units for CBG >175 based on actual CBG and number of carbs in meal; >325 call MD    . insulin glargine (LANTUS) 100 UNIT/ML injection Inject 9 Units into the skin at bedtime. Or as directed by sliding scale.    . isosorbide mononitrate (IMDUR) 60 MG 24 hr tablet Take 1 tablet (60 mg total) by mouth daily. 30 tablet 11  . metolazone (ZAROXOLYN) 2.5 MG tablet Take 2.5 mg by mouth every 7 (seven) days. Take one (1) tablet (2.5 mg total) by mouth 30 minutes before Torsemide on Wednesdays    . metoprolol tartrate (LOPRESSOR) 25 MG tablet Take 1 tablet (25 mg total) by mouth daily. 30 tablet 11  . nitroGLYCERIN (NITROSTAT) 0.4 MG SL tablet Place 1 tablet (0.4 mg total) under the tongue every 5 (five) minutes as needed for chest pain. 25 tablet  3  . OVER THE COUNTER MEDICATION Take 1 packet by mouth daily. Nature Made Diabetic Pak (6 tablet combo)    . polyethylene glycol (MIRALAX / GLYCOLAX) packet Take 17 g by mouth 2 (two) times daily.     . potassium chloride SA (K-DUR,KLOR-CON) 20 MEQ tablet Take 1 tablet (20 mEq total) by mouth daily. 30 tablet 6  . Tafluprost 0.0015 % SOLN Place 1 drop into both eyes at bedtime. ZIOPTAN    . tamsulosin (FLOMAX) 0.4 MG CAPS capsule Take 1 capsule (0.4 mg total) by mouth daily after supper. 30 capsule 0  . torsemide (DEMADEX) 20 MG tablet Take 80 mg by mouth daily.      Home: Home Living Family/patient expects to be discharged to:: Private residence Living Arrangements: Children, Non-relatives/Friends Available Help at Discharge: Personal care attendant, Available 24 hours/day, Family Type of Home: House Home Access: Stairs to enter [employer] of Steps: 3 Entrance Stairs-Rails: Right, Left, Can reach both Home Layout: One level Bathroom Shower/Tub: Chiropodist: Handicapped height Bathroom Accessibility: Yes Home Equipment: Walker - 4 wheels, Tub bench, Hand held shower head, Walker - 2 wheels, Adaptive equipment Adaptive Equipment: Reacher, Sock aid, Long-handled shoe horn, Long-handled sponge Additional Comments: pt has 2 steps to enter his bedroom with a single rail.    Functional History: Prior Function Level of Independence: Needs assistance Gait / Transfers Assistance Needed: Ambulated with rollator and would sit prn when legs got tired.  Walked short distances with the caregiver and son ADL's / Homemaking Assistance Needed: assisted for tub bench transfer, to wash feet and back, for LB dressing, meal prep and housekeeping. Pt routinely sits to perform grooming on his rollator. Functional Status:  Mobility: Bed Mobility Overal bed mobility: Needs Assistance, +2 for physical assistance Bed Mobility: Rolling, Sidelying to Sit Rolling: Mod  assist Sidelying to sit: Max assist, +2 for physical assistance, HOB elevated General bed mobility comments: up in recliner, caregiver reports able to get pt up to EOB herself, but had nursing assist with up to chair Transfers Overall transfer level: Needs assistance Equipment used: Rolling walker (2 wheeled) Transfer via Lift Equipment:  Stedy Transfers: Sit to/from [employer] to Stand: +2 physical assistance, Mod assist General transfer comment: cues for scooting to edge of chair and assist for pushing up from armrests, assist for lifting and lowering safely due to knees buckling at times. Ambulation/Gait Ambulation/Gait assistance: Mod assist, +2 safety/equipment Ambulation Distance (Feet): 8 Feet (4' fwd and 4' back x 2 trials) Assistive device: Rolling walker (2 wheeled) Gait Pattern/deviations: Step-to pattern, Trunk flexed, Shuffle, Decreased stride length General Gait Details: able to get about 4' fwd, then with backing up knees begin to buckle versus myoclonus as seems uncontrolled    ADL:    Cognition: Cognition Overall Cognitive Status: Within Functional Limits for tasks assessed Orientation Level: Oriented X4 Cognition Arousal/Alertness: Awake/alert Behavior During Therapy: WFL for tasks assessed/performed Overall Cognitive Status: Within Functional Limits for tasks assessed Difficult to assess due to: Hard of hearing/deaf (CG talks over top of pt.)  Blood pressure 115/55, pulse 60, temperature 97.5 F (36.4 C), temperature source Oral, resp. rate 18, height _0  (1.626 m), weight 79.516 kg (175 lb 4.8 oz), SpO2 98 %. Physical Exam  Vitals reviewed. Constitutional: He is oriented to person, place, and time. He appears well-developed and well-nourished.  HENT:  Head: Normocephalic and atraumatic.  Eyes: Conjunctivae and EOM are normal.  Neck: Normal range of motion. Neck supple. No thyromegaly present.  Cardiovascular: Normal rate and regular rhythm.   Respiratory:  Effort normal and breath sounds normal. No respiratory distress.  GI: Soft. Bowel sounds are normal. He exhibits no distension.  Musculoskeletal: He exhibits tenderness (Right heel). He exhibits no edema.  PROM WNL  Neurological: He is alert and oriented to person, place, and time.  Follows simple commands. Motor: b/l UE 5/5 proximal to distal RLE: hip flexion 3+/5, ankle dorsi/plantar flexion 4-/5 LLE: hip flexion 3/5, ankle dorsi/plantar flexion 2/5  Skin: Skin is warm and dry.  Psychiatric: He has a normal mood and affect. His behavior is normal.   Results for orders placed or performed during the hospital encounter of 04/20/15 (from the past 24 hour(s))  Glucose, capillary     Status: Abnormal   Collection Time: 04/24/15  4:50 PM  Result Value Ref Range   Glucose-Capillary 138 (H) 65 - 99 mg/dL   Comment 1 Notify RN    Comment 2 Document in Chart   Glucose, capillary     Status: Abnormal   Collection Time: 04/24/15  9:27 PM  Result Value Ref Range   Glucose-Capillary 114 (H) 65 - 99 mg/dL   Comment 1 Notify RN    Comment 2 Document in Chart   Basic metabolic panel     Status: Abnormal   Collection Time: 04/25/15  4:30 AM  Result Value Ref Range   Sodium 130 (L) 135 - 145 mmol/L   Potassium 3.9 3.5 - 5.1 mmol/L   Chloride 87 (L) 101 - 111 mmol/L   CO2 30 22 - 32 mmol/L   Glucose, Bld 166 (H) 65 - 99 mg/dL   BUN 94 (H) 6 - 20 mg/dL   Creatinine, Ser 3.62 (H) 0.61 - 1.24 mg/dL   Calcium 9.5 8.9 - 10.3 mg/dL   GFR calc non Af Amer 13 (L) >60 mL/min   GFR calc Af Amer 15 (L) >60 mL/min   Anion gap 13 5 - 15  Glucose, capillary     Status: Abnormal   Collection Time: 04/25/15  6:44 AM  Result Value Ref Range   Glucose-Capillary 164 (H) 65 - 99 mg/dL  Glucose, capillary     Status: Abnormal   Collection Time: 04/25/15 12:20 PM  Result Value Ref Range   Glucose-Capillary 178 (H) 65 - 99 mg/dL  Glucose, capillary     Status: Abnormal   Collection Time: 04/25/15  1:59 PM   Result Value Ref Range   Glucose-Capillary 280 (H) 65 - 99 mg/dL   Dg Fluoro Guide Ndl Plcd/bx/inj/loc  04/25/2015  CLINICAL DATA:  Right hip pain. EXAM: RIGHT HIP INJECTION UNDER FLUOROSCOPY FLUOROSCOPY TIME:  Fluoroscopy Time (in minutes and seconds): 1 MINUTE 48 SECONDS PROCEDURE: Informed consent was obtained from Dr. Ellsworth Lennox and his accompanying friend. Dr. Ellsworth Lennox reported that his friend has power of attorney for him. Overlying skin prepped with Betadine, draped in the usual sterile fashion, and infiltrated locally with buffered Lidocaine. Curved 20 gauge spinal needle advanced to the superolateral margin of the right femoral head. 1 ml of Lidocaine injected easily. Diagnostic injection of iodinated contrast demonstrates intra-articular spread without intravascular component. 116m Depo-Medrol and 5 ml Sensorcaine 0.5% were then administered. No immediate complication. IMPRESSION: Technically successful right hip injection under fluoroscopy. Dr. BKennon Holtertolerated the procedure well. Electronically Signed   By: JLorriane ShireM.D.   On: 04/25/2015 13:02    Assessment/Plan: Diagnosis: Debility/back pain Labs and images independently reviewed.  Records reviewed and summated above.  1. Does the need for close, 24 hr/day medical supervision in concert with the patient's rehab needs make it unreasonable for this patient to be served in a less intensive setting? Yes  2. Co-Morbidities requiring supervision/potential complications: diabetes mellitus peripheral neuropathy (Monitor in accordance with exercise and adjust meds as necessary), PAF with pacemaker (Cont to monitor with increased activity, cont meds), chronic combined congestive heart failure (Monitor in accordance with increased physical activity and avoid UE resistance excercises),CKD stage IV (avoid nephrotoxic meds), CVA (cont meds), gout (cont meds, monitor for joint pain), hyponatremia (cont to monitor, consider repletion if necessary),  macrocytic anemia (transfuse if necessary to ensure appropriate perfusion for increased activity tolerance, consider vitamin supplementation), thrombocytopenia < 60,000/mm3 plts no resistive exercise), HTN (monitor and provide prns in accordance with increased physical exertion and pain),bph with urinary retention (cont foley per urology, follow up with urology) 3. Due to bladder management, skin/wound care, disease management, medication administration, pain management and patient education, does the patient require 24 hr/day rehab nursing? Yes 4. Does the patient require coordinated care of a physician, rehab nurse, PT (1-2 hrs/day, 5 days/week) and OT (1-2 hrs/day, 5 days/week) to address physical and functional deficits in the context of the above medical diagnosis(es)? Potentially Addressing deficits in the following areas: balance, endurance, locomotion, strength, transferring, bowel/bladder control, bathing, dressing, grooming, toileting, cognition and psychosocial support 5. Can the patient actively participate in an intensive therapy program of at least 3 hrs of therapy per day at least 5 days per week? Yes 6. The potential for patient to make measurable gains while on inpatient rehab is good 7. Anticipated functional outcomes upon discharge from inpatient rehab are supervision and min assist  with PT, supervision and min assist with OT, supervision and min assist with SLP. 8. Estimated rehab length of stay to reach the above functional goals is: 15-17 days. 9. Does the patient have adequate social supports and living environment to accommodate these discharge functional goals? Potentially 10. Anticipated D/C setting: Undetermined 11. Anticipated post D/C treatments: HH therapy and Home excercise program 12. Overall Rehab/Functional Prognosis: good  RECOMMENDATIONS: This patient's condition is appropriate for continued rehabilitative care  in the following setting: Pt minimally ambulating,  however, this may be close to his baseline.  Caregiver not present during examination.  Will speak to caregiver and if it appears pt is close to his baseline and if pt's needs have now exceeded her capabilities, would recommend SNF.  Patient has agreed to participate in recommended program. Yes Note that insurance prior authorization may be required for reimbursement for recommended care.  Comment: Rehab Admissions Coordinator to follow up.  Delice Lesch, MD 04/25/2015

## 2015-04-25 NOTE — Progress Notes (Addendum)
4:30pm CSW was informed of CIR consult from MD- CIR can not admit if pt is still observation status- CSW spoke with CIR admissions coordinator who will follow up in am  Plan for DC tomorrow- pt and caregiver informed- they are upset about CIR likely being unable to admit- they though they were told he would be admitted to CIR and therefore had not toured facilities today after bed offers were given  1pm CSW provided bed offers to pt and pt caregiver- pt would like to stay in the hospital while recovering- pt very hesitant about SNF. Caregiver and pt dtr very involved and touring facilities today to make decision.  CSW will continue to follow  Domenica Reamer, [place] Social Worker [phone]

## 2015-04-25 NOTE — Progress Notes (Signed)
Physical Therapy Treatment Patient Details Name: RABON HAGEE, MD MRN: [id number] DOB: Mar 24, 1919 Today's Date: 04/25/2015    History of Present Illness pt presents with pt presents with CHF and Back Pain.  pt with hx of back surgery, TKA, Spinal Stenosis, CAD, PVD, CVA, HTN, CHF, DM, Pulmonary HTN, Pacemaker, CKD, Neuropathies, and hyponatremia.  pt also with multiple recent admits.      PT Comments    Patient with improved mobility, independence and activity tolerance.  Feel could potentially benefit from CIR level rehab as still weak and needs to be able to negotiate 2 steps for home entry.  One caregiver still not enough for d/c home safely, but feel short term intensive rehab may be indicated to facilitate d/c home with caregiver assist sooner than SNF as patient tolerating more mobility this session.  Follow Up Recommendations  CIR     Equipment Recommendations  Other (comment) (TBA)    Recommendations for Other Services Rehab consult;OT consult     Precautions / Restrictions Precautions Precautions: Fall Precaution Comments: knees buckle    Mobility  Bed Mobility               General bed mobility comments: up in recliner, caregiver reports able to get pt up to EOB herself, but had nursing assist with up to chair  Transfers Overall transfer level: Needs assistance Equipment used: Rolling walker (2 wheeled) Transfers: Sit to/from Stand Sit to Stand: +2 physical assistance;Mod assist         General transfer comment: cues for scooting to edge of chair and assist for pushing up from armrests, assist for lifting and lowering safely due to knees buckling at times.  Ambulation/Gait Ambulation/Gait assistance: Mod assist;+2 safety/equipment Ambulation Distance (Feet): 8 Feet (4' fwd and 4' back x 2 trials) Assistive device: Rolling walker (2 wheeled) Gait Pattern/deviations: Step-to pattern;Trunk flexed;Shuffle;Decreased stride length     General Gait  Details: able to get about 4' fwd, then with backing up knees begin to buckle versus myoclonus as seems uncontrolled   Financial trader Rankin (Stroke Patients Only)       Balance Overall balance assessment: Needs assistance         Standing balance support: Bilateral upper extremity supported Standing balance-Leahy Scale: Poor Standing balance comment: UE support and assist for balance and safety                    Cognition Arousal/Alertness: Awake/alert Behavior During Therapy: WFL for tasks assessed/performed Overall Cognitive Status: Within Functional Limits for tasks assessed                      Exercises Other Exercises Other Exercises: sit to/from stand x 2 for LE strengthening    General Comments        Pertinent Vitals/Pain Pain Score: 5  Pain Location: lower back Pain Descriptors / Indicators: Aching Pain Intervention(s): Monitored during session;Repositioned    Home Living                      Prior Function            PT Goals (current goals can now be found in the care plan section) Progress towards PT goals: Progressing toward goals    Frequency  Min 3X/week    PT Plan Discharge plan needs to be updated    Co-evaluation  End of Session Equipment Utilized During Treatment: Gait belt Activity Tolerance: Patient limited by fatigue Patient left: in chair;with call bell/phone within reach;with family/visitor present     Time: 1315-1340 PT Time Calculation (min) (ACUTE ONLY): 25 min  Charges:  $Gait Training: 8-22 mins $Therapeutic Exercise: 8-22 mins                    G Codes:      WYNN,CYNDI 05/14/15, 1:59 PM  Magda Kiel, [place] 2015-05-14

## 2015-04-26 DIAGNOSIS — M5440 Lumbago with sciatica, unspecified side: Secondary | ICD-10-CM | POA: Diagnosis not present

## 2015-04-26 DIAGNOSIS — R269 Unspecified abnormalities of gait and mobility: Secondary | ICD-10-CM | POA: Diagnosis not present

## 2015-04-26 DIAGNOSIS — Z79899 Other long term (current) drug therapy: Secondary | ICD-10-CM

## 2015-04-26 DIAGNOSIS — I5032 Chronic diastolic (congestive) heart failure: Secondary | ICD-10-CM | POA: Diagnosis not present

## 2015-04-26 DIAGNOSIS — I5022 Chronic systolic (congestive) heart failure: Secondary | ICD-10-CM | POA: Diagnosis not present

## 2015-04-26 LAB — URINALYSIS, ROUTINE W REFLEX MICROSCOPIC
BILIRUBIN URINE: NEGATIVE
GLUCOSE, UA: NEGATIVE mg/dL
Ketones, ur: NEGATIVE mg/dL
LEUKOCYTES UA: NEGATIVE
NITRITE: NEGATIVE
PH: 5.5 (ref 5.0–8.0)
Protein, ur: NEGATIVE mg/dL
SPECIFIC GRAVITY, URINE: 1.013 (ref 1.005–1.030)

## 2015-04-26 LAB — BASIC METABOLIC PANEL
Anion gap: 11 (ref 5–15)
BUN: 101 mg/dL — ABNORMAL HIGH (ref 6–20)
CHLORIDE: 87 mmol/L — AB (ref 101–111)
CO2: 32 mmol/L (ref 22–32)
CREATININE: 3.61 mg/dL — AB (ref 0.61–1.24)
Calcium: 9.5 mg/dL (ref 8.9–10.3)
GFR calc non Af Amer: 13 mL/min — ABNORMAL LOW (ref >60)
GFR, EST AFRICAN AMERICAN: 15 mL/min — AB (ref >60)
GLUCOSE: 263 mg/dL — AB (ref 65–99)
Potassium: 3.9 mmol/L (ref 3.5–5.1)
Sodium: 130 mmol/L — ABNORMAL LOW (ref 135–145)

## 2015-04-26 LAB — URINE MICROSCOPIC-ADD ON

## 2015-04-26 LAB — HEMOGLOBIN AND HEMATOCRIT, BLOOD
HEMATOCRIT: 35.1 % — AB (ref 39.0–52.0)
HEMOGLOBIN: 11.9 g/dL — AB (ref 13.0–17.0)

## 2015-04-26 LAB — GLUCOSE, CAPILLARY
Glucose-Capillary: 247 mg/dL — ABNORMAL HIGH (ref 65–99)
Glucose-Capillary: 329 mg/dL — ABNORMAL HIGH (ref 65–99)

## 2015-04-26 MED ORDER — TORSEMIDE 20 MG PO TABS: 80.0000 mg | ORAL_TABLET | Freq: Every day | ORAL | Status: DC

## 2015-04-26 MED ORDER — METHOCARBAMOL 500 MG PO TABS: 500.0000 mg | ORAL_TABLET | Freq: Three times a day (TID) | ORAL | Status: DC | PRN

## 2015-04-26 MED ORDER — HYDROCODONE-ACETAMINOPHEN 5-325 MG PO TABS: 1.0000 | ORAL_TABLET | ORAL | Status: DC | PRN

## 2015-04-26 MED ORDER — POLYVINYL ALCOHOL 1.4 % OP SOLN: 1.0000 [drp] | Freq: Two times a day (BID) | OPHTHALMIC | Status: AC

## 2015-04-26 NOTE — Care Management Note (Addendum)
Case Management Note  Patient Details  Name: CHAPEL ZELLAR, MD MRN: [id number] Date of Birth: February 11, 1919  Subjective/Objective:         CHF, back pain           Action/Plan: NCM spoke to pt and permission given to speak to [name] [phone], states she plans to visit [facility] this am. [facility] Admission Coordinator, Claiborne Billings # [phone] and they are willing to accept admission today. Will follow up with CSW for scheduled dc today to [facility].   Late entry 04/25/2015 1600 NCM spoke to attending and plan was not for dc today. Will dc on 04/26/2015 pending Creatinine results. Pt was not appropriate for IP rehab per notes.     Expected Discharge Date:  04/26/2015              Expected Discharge Plan:  Skilled Nursing Facility  In-House Referral:  Clinical Social Work  Discharge planning Services  CM Consult  Post Acute Care Choice:  NA Choice offered to:  NA  DME Arranged:  N/A DME Agency:  NA  HH Arranged:  NA HH Agency:  NA  Status of Service:  Completed, signed off  Medicare Important Message Given:    Date Medicare IM Given:    Medicare IM give by:    Date Additional Medicare IM Given:    Additional Medicare Important Message give by:     If discussed at [place] of Stay Meetings, dates discussed:    Additional Comments:  Erenest Rasher, RN 04/26/2015, 9:12 AM

## 2015-04-26 NOTE — Discharge Summary (Signed)
Physician Discharge Summary  Darrell Mulder, MD [id number] DOB: Dec 23, 1918 DOA: 04/20/2015  PCP: [name], MD  Admit date: 04/20/2015 Discharge date: 04/26/2015  Time spent: 45 minutes  Recommendations for Outpatient Follow-up:  Patient will be discharged to [facility].  Patient will need to follow up with primary care provider within one week of discharge, repeat BMP.  Patient will also follow up with urology, Dr. Diona Fanti, 04/27/2015.  Patient will also need to follow up with Dr. Florene Glen, nephrologist, in 1-2 weeks.  Follow up with Dr. Vertell Limber, neurosurgery, as needed.  Patient should continue medications as prescribed.  Patient should follow a heart healthy/carb modified diet.   Discharge Diagnoses:  Back and hip pain/DJD Chronic kidney disease, stage IV Urinary retention due to BPH Type 2 diabetes mellitus with neuropathy Paroxysmal atrial fibrillation Sick sinus syndrome/pacemaker Chronic diastolic heart failure Physical deconditioning and gait abnormality  Discharge Condition: Stable  Diet recommendation: Heart healthy/carb modified   Filed Weights   04/24/15 0505 04/25/15 0554 04/26/15 0457  Weight: 80.2 kg (176 lb 12.9 oz) 79.516 kg (175 lb 4.8 oz) 79.652 kg (175 lb 9.6 oz)    History of present illness:  On 04/20/2015 by [name] NP/Dr. Patsey Berthold, MD is a 81 y.o. male with multiple medical problem not limited to DM with neuropathy, PAF / pacemaker, chronic systollc CHF, CKD stage 4 and gout. He was discharged from here yesterday after admission for acute urinary retention / constipation, acute on chronic CKD. His lactic acid level was elevated but not felt to be septic. Foley placed for urinary retention, flomax started. He is to follow up with Urology. As far as constipation, CTscan was negative for bowel abnormalities but did show prominence of the prostate with inferior bladder wall thickening, possibly bladder outlet  obstruction. Miralax increased to 2-3 times daily. Pleural effusions seen but patient was asymptomatic.  Patient brought back to the emergency department today for severe lower back pain radiating down both legs and associated ambulation problems. Patient does not usually take pain medications but just had some morphine . He is sleepy. Wife at bedside, provides history. Patient began complaining of back pain yesterday while still hospitalized. He indicated the pain started after positioning for x-ray (patient did have a CT scan 2 days ago ). Despite the pain patient wanted to go home, thought he would feel more comfortable there . Unfortunately the lower back pain became progressively worse with radiation down into both legs. He could not stand because of the pain as well as associated weakness in both legs . Patient could not find a comfortable position for sleep last night. Wife brought him back to the emergency department in the middle of the night. No loss of bowel or bladder function. Patient has chronic back pain but apparently he has not had this degree of pain before. His last back surgery was approximately 20 years ago. Lumbar spine CT scan today negative for any acute abnormalities.  Hospital Course:  Back pain/hip pain/DJD -Improving with steroids -Cannot obtain MRI due to pacemaker -Neurosurgery consulted and appreciated, felt pain to be more related to hip osteoarthritis, recommended steroid course as well as hip injection, outpatient follow-up if needed -Status post hip injection -PT consult and recommended CIR, however CIR recommended SNF -Prednisone discontinued as patient receives steroid injection  Chronic kidney disease, stage IV -Baseline creatinine of approximately 3, currently 3.6 -Spoke with Dr. Marval Regal, who recommended follow-up with Dr. Florene Glen in the office. Metolazone  have her continue home Demadex dose. -Last only noted in the office was 178 pounds, currently  175  Urinary retention due to BPH -Patient currently has Foley catheter placed. Patient refused voiding trial at this time -Did speak with urology via phone, patient does have appointment 04/27/2015 for follow-up -Patient noted to have a small amount of blood with urination possibly due to irritation from Foley catheter. Okay to continue Eliquis per urology. -Continue Flomax  Type 2 diabetes mellitus with neuropathy -Continue home insulin regimen  Paroxysmal atrial fibrillation -Continue Eliquis -Patient may consider speaking with his primary care physician regarding discontinuing Eliquis  Sick sinus syndrome/St Jude pacemaker -Stable  Chronic diastolic heart failure -Patient currently appears to be euvolemic -Diuretics were held due to kidney disease -Spoke with nephrology, recommended continuing Demadex, hold metolazone -Weight currently 175 down 3 pounds from last nephrology office visit  Physical deconditioning and gait abnormality -Patient will be discharged to nursing facility for rehabilitation  Procedures: Right hip injection  Consultations: Neurosurgery Interventional radiology PMR Nephrology via phone Urology via phone  Discharge Exam: Filed Vitals:   04/25/15 2004 04/26/15 0457  BP: 124/60 122/56  Pulse: 64 60  Temp: 97.4 F (36.3 C) 97.5 F (36.4 C)  Resp: 18 18     General: Well developed, well nourished, NAD, appears stated age  48: NCAT, mucous membranes moist.  Cardiovascular: S1 S2 auscultated, RRR, no murmurs  Respiratory: Clear to auscultation bilaterally with equal chest rise  Abdomen: Soft, nontender, nondistended, + bowel sounds  Extremities: warm dry without cyanosis clubbing or edema  Neuro: AAOx3, nonfocal. Hard of hearing  Psych: Normal affect and demeanor  Discharge Instructions      Discharge Instructions    Discharge instructions    Complete by:  As directed   Patient will be discharged to [facility].  Patient will need to follow up with primary care provider within one week of discharge, repeat BMP.  Patient will also follow up with urology, Dr. Diona Fanti, 04/27/2015.  Patient will also need to follow up with Dr. Florene Glen, nephrologist, in 1-2 weeks.  Follow up with Dr. Vertell Limber, neurosurgery, as needed.  Patient should continue medications as prescribed.  Patient should follow a heart healthy/carb modified diet.            Medication List    STOP taking these medications        metolazone 2.5 MG tablet  Commonly known as:  ZAROXOLYN      TAKE these medications        allopurinol 100 MG tablet  Commonly known as:  ZYLOPRIM  Take 100 mg by mouth 2 (two) times daily.     amiodarone 200 MG tablet  Commonly known as:  PACERONE  Take 1 tablet (200 mg total) by mouth at bedtime.     apixaban 2.5 MG Tabs tablet  Commonly known as:  ELIQUIS  Take 1 tablet (2.5 mg total) by mouth 2 (two) times daily.     Carboxymethylcellul-Glycerin 0.5-0.9 % Soln  Place 1 drop into both eyes 2 (two) times daily.     ferrous sulfate 325 (65 FE) MG tablet  Take 325 mg by mouth every other day.     HYDROcodone-acetaminophen 5-325 MG tablet  Commonly known as:  NORCO/VICODIN  Take 1-2 tablets by mouth every 4 (four) hours as needed for moderate pain.     insulin glargine 100 UNIT/ML injection  Commonly known as:  LANTUS  Inject 9 Units into the skin at  bedtime. Or as directed by sliding scale.     isosorbide mononitrate 60 MG 24 hr tablet  Commonly known as:  IMDUR  Take 1 tablet (60 mg total) by mouth daily.     methocarbamol 500 MG tablet  Commonly known as:  ROBAXIN  Take 1 tablet (500 mg total) by mouth every 8 (eight) hours as needed for muscle spasms.     metoprolol tartrate 25 MG tablet  Commonly known as:  LOPRESSOR  Take 1 tablet (25 mg total) by mouth daily.     nitroGLYCERIN 0.4 MG SL tablet  Commonly known as:  NITROSTAT  Place 1 tablet (0.4 mg total) under the tongue  every 5 (five) minutes as needed for chest pain.     NOVOLOG FLEXPEN 100 UNIT/ML FlexPen  Generic drug:  insulin aspart  Inject 9 Units into the skin 3 (three) times daily with meals. Per sliding scale:  CBG <175 no insulin (unless consuming large amounts of carbs; 6-10 units for CBG >175 based on actual CBG and number of carbs in meal; >325 call MD     OVER THE COUNTER MEDICATION  Take 1 packet by mouth daily. Nature Made Diabetic Pak (6 tablet combo)     polyethylene glycol packet  Commonly known as:  MIRALAX / GLYCOLAX  Take 17 g by mouth 2 (two) times daily.     polyvinyl alcohol 1.4 % ophthalmic solution  Commonly known as:  LIQUIFILM TEARS  Place 1 drop into both eyes 2 (two) times daily.     potassium chloride SA 20 MEQ tablet  Commonly known as:  K-DUR,KLOR-CON  Take 1 tablet (20 mEq total) by mouth daily.     Tafluprost 0.0015 % Soln  Place 1 drop into both eyes at bedtime. ZIOPTAN     tamsulosin 0.4 MG Caps capsule  Commonly known as:  FLOMAX  Take 1 capsule (0.4 mg total) by mouth daily after supper.     torsemide 20 MG tablet  Commonly known as:  DEMADEX  Take 80 mg by mouth daily.       Allergies  Allergen Reactions  . Statins Other (See Comments)    rhabdomyolisis  . Aggrenox [Aspirin-Dipyridamole Er] Other (See Comments)    Dizziness  . Carvedilol Other (See Comments)    Dizziness  . Claritin [Loratadine] Other (See Comments)    Dizziness & drowsiness   . Cymbalta [Duloxetine Hcl]     Confusion   . Latanoprost Itching    Watery   . Lyrica [Pregabalin] Other (See Comments)    Makes him dizzy. Takes him days to recover.  . Mucinex [Guaifenesin Er] Other (See Comments)    Dizziness, drowsiness  . Other Other (See Comments)    Preservatives in glaucoma gtts cause eye irritation, has to use preservative free  . Phenergan [Promethazine Hcl] Other (See Comments)    Causes severe confusion  . Plavix [Clopidogrel Bisulfate] Other (See Comments)     Dizziness  . Rapaflo [Silodosin] Other (See Comments)    Dizziness for about 30- 45  Minutes At time of preop appointment on 08/26/2014 patient denies any problems with this medication  . Travatan Z [Travoprost] Other (See Comments)    Eye irritation   Follow-up Information    Follow up with [name], MD. Schedule an appointment as soon as possible for a visit in 1 week.   Specialty:  Internal Medicine   Why:  Hospital follow up   Contact information:   [place]  [place] [place] [address] [phone]       Follow up with [facility] C, MD. Schedule an appointment as soon as possible for a visit in 1 week.   Specialty:  Nephrology   Why:  Hospital follow up, kidney disease   Contact information:   [place] [address] [phone]       Follow up with Jorja Loa, MD. Go on 04/27/2015.   Specialty:  Urology   Contact information:   [place] [address] [phone]       Schedule an appointment as soon as possible for a visit with Peggyann Shoals, MD.   Specialty:  Neurosurgery   Why:  As needed   Contact information:   [address] [place] [address] [phone]        The results of significant diagnostics from this hospitalization (including imaging, microbiology, ancillary and laboratory) are listed below for reference.    Significant Diagnostic Studies: Ct Abdomen Pelvis Wo Contrast  04/18/2015  EXAM: CT ABDOMEN AND PELVIS WITHOUT CONTRAST TECHNIQUE: Multidetector CT imaging of the abdomen and pelvis was performed following the standard protocol without IV contrast. COMPARISON:  Abdominal ultrasound 07/17/2014 FINDINGS: Small left pleural effusion and trace right effusion. No confluent opacity in the visualized lung bases. Heart is normal size. Liver appears small. No focal hepatic abnormality. Spleen, pancreas, right adrenal, kidneys have an unremarkable  unenhanced appearance. 2.2 cm nodule in the left adrenal gland has a density of 35 Hounsfield units, nonspecific. No renal or ureteral stones. No hydronephrosis. There is prominence of the prostate. The inferior bladder wall is thickened, suggesting possible bladder outlet obstruction. Aorta and iliac vessels are heavily calcified, non aneurysmal. Small left inguinal hernia containing fat. There is no free fluid, free air or adenopathy. Stomach, large and small bowel are unremarkable. IMPRESSION: No renal or ureteral stones.  No hydronephrosis. Prominence of the prostate with inferior bladder wall thickening, possibly related to bladder outlet obstruction. Left inguinal hernia containing fat. Small left pleural effusion and trace right pleural effusion. Electronically Signed   By: Rolm Baptise M.D.   On: 04/18/2015 11:21   Ct Lumbar Spine Wo Contrast  04/20/2015  CLINICAL DATA:  Back pain with BILATERAL leg weakness which began after being moved on to x-ray table 2 days ago. EXAM: CT LUMBAR SPINE WITHOUT CONTRAST TECHNIQUE: Multidetector CT imaging of the lumbar spine was performed without intravenous contrast administration. Multiplanar CT image reconstructions were also generated. COMPARISON:  Lumbar radiographs 02/28/2015. CT abdomen and pelvis 04/18/2015. FINDINGS: The patient has undergone previous lumbar fusion. Solid arthrodesis at L2-3 anteriorly. Previous posterior decompression from L2 through L5 with some regrowth of posterior elements. Moderate facet arthropathy throughout. Trace anterolisthesis L4-5, otherwise anatomic alignment. Vacuum phenomenon at L5-S1 is chronic. No acute compression fracture or posttraumatic subluxation. Exuberant endplate reactive changes with osseous spurring. Benign-appearing Schmorl's node L5-S1 on the RIGHT. Shallow calcific protrusion L1-L2. Atherosclerosis. Paravertebral soft tissues unchanged from priors with stable LEFT adrenal nodule. IMPRESSION: No posttraumatic  sequelae or concern for spinal infection. Postsurgical changes as described. Electronically Signed   By: Staci Righter M.D.   On: 04/20/2015 10:46   Dg Fluoro Guide Ndl Plcd/bx/inj/loc  04/25/2015  CLINICAL DATA:  Right hip pain. EXAM: RIGHT HIP INJECTION UNDER FLUOROSCOPY FLUOROSCOPY TIME:  Fluoroscopy Time (in  minutes and seconds): 1 MINUTE 48 SECONDS PROCEDURE: Informed consent was obtained from Dr. Ellsworth Lennox and his accompanying friend. Dr. Ellsworth Lennox reported that his friend has power of attorney for him. Overlying skin prepped with Betadine, draped in the usual sterile fashion, and infiltrated locally with buffered Lidocaine. Curved 20 gauge spinal needle advanced to the superolateral margin of the right femoral head. 1 ml of Lidocaine injected easily. Diagnostic injection of iodinated contrast demonstrates intra-articular spread without intravascular component. 120mg  Depo-Medrol and 5 ml Sensorcaine 0.5% were then administered. No immediate complication. IMPRESSION: Technically successful right hip injection under fluoroscopy. Dr. Kennon Holter tolerated the procedure well. Electronically Signed   By: Lorriane Shire M.D.   On: 04/25/2015 13:02    Microbiology: No results found for this or any previous visit (from the past 240 hour(s)).   Labs: Basic Metabolic Panel:  Recent Labs Lab 04/20/15 0838 04/21/15 0251 04/23/15 0425 04/25/15 0430 04/26/15 0420  NA 135 134* 132* 130* 130*  K 4.1 3.4* 3.7 3.9 3.9  CL 92* 92* 91* 87* 87*  CO2 31 32 29 30 32  GLUCOSE 210* 244* 157* 166* 263*  BUN 59* 58* 77* 94* 101*  CREATININE 3.13* 3.00* 3.59* 3.62* 3.61*  CALCIUM 9.8 9.2 9.5 9.5 9.5   Liver Function Tests: No results for input(s): AST, ALT, ALKPHOS, BILITOT, PROT, ALBUMIN in the last 168 hours. No results for input(s): LIPASE, AMYLASE in the last 168 hours. No results for input(s): AMMONIA in the last 168 hours. CBC:  Recent Labs Lab 04/20/15 0838  WBC 10.3  NEUTROABS 7.6  HGB 11.3*  HCT 33.9*   MCV 102.4*  PLT 141*   Cardiac Enzymes: No results for input(s): CKTOTAL, CKMB, CKMBINDEX, TROPONINI in the last 168 hours. BNP: BNP (last 3 results)  Recent Labs  09/04/14 1439 10/04/14 1241 02/27/15 0924  BNP 1383.6* 467.9* 325.4*    ProBNP (last 3 results)  Recent Labs  07/16/14 1213  PROBNP 460.0*    CBG:  Recent Labs Lab 04/25/15 1359 04/25/15 1617 04/25/15 2116 04/26/15 0621 04/26/15 1126  GLUCAP 280* 271* 223* 247* 329*     Signed:  Orren Pietsch  [facility] 04/26/2015, 11:39 AM

## 2015-04-26 NOTE — Progress Notes (Signed)
Patient will discharge to [facility] Anticipated discharge date:12/13 Family notified: caretaker at bedside Transportation by [facility]- scheduled for 1:30pm  CSW signing off.  Domenica Reamer, [place] Social Worker [phone]

## 2015-04-26 NOTE — NC FL2 (Signed)
[place] LEVEL OF CARE SCREENING TOOL     IDENTIFICATION  Patient Name: Darrell Mulder, MD Birthdate: 10/12/1918 Sex: male Admission Date (Current Location): 04/20/2015  [facility] and [place] Number: Herbalist and Address:  [facility] [place]. [facility], [place] [address], [place], [place] [address]      Provider Number: [id number]  Attending Physician Name and Address:  Cristal Ford, DO  Relative Name and Phone Number:  [place], [place] [phone]    Current Level of Care: Hospital Recommended Level of Care: [place] Prior Approval Number:    Date Approved/Denied:   PASRR Number: [id number]  Discharge Plan: SNF    Current Diagnoses: Patient Active Problem List   Diagnosis Date Noted  . Osteoarthritis of hip 04/25/2015  . Abnormality of gait 04/21/2015  . Back pain 04/20/2015  . Severe comorbid illness 04/20/2015  . Gait abnormality 04/20/2015  . Low back pain   . Lactic acidosis 04/18/2015  . AKI (acute kidney injury) ([place]) 04/18/2015  . Chronic systolic CHF (congestive heart failure) ([place]) 04/18/2015  . Constipation 04/18/2015  . SSS (sick sinus syndrome) ([place]) 04/18/2015  . Bilateral plantar fasciitis 03/01/2015  . Bilateral leg and foot pain 02/27/2015  . Foot pain, bilateral 02/27/2015  . Dehydration 10/04/2014  . Vertigo 10/04/2014  . Urinary retention due to benign prostatic hyperplasia 08/27/2014  . Foot drop, left 07/21/2014  . Leg weakness 07/16/2014  . Chronic diastolic heart failure ([place]) 04/16/2014  . Essential hypertension   . Gait disorder 03/31/2014  . Solitary pulmonary nodule 10/09/2013  . CKD (chronic kidney disease) stage 4, GFR 15-29 ml/min (HCC)   . Long term current use of amiodarone 06/25/2013  . Chronic anticoagulation 04/03/2013    Class: Chronic  . Pacemaker - St Jude 04/03/2013    Class: Chronic  . Paroxysmal atrial fibrillation, DCCV 06/16/13 08/22/2012    Class: Chronic  .  Hypertensive cardiovascular disease   . Type 2 diabetes mellitus with peripheral neuropathy (HCC)     Orientation RESPIRATION BLADDER Height & Weight    Self, Time, Situation, Place  O2 (As needed) (Nasal Cannula 2L/min) Continent, Indwelling catheter (Urinary Catheter) 5\' 4"  (162.6 cm) 174 lbs.  BEHAVIORAL SYMPTOMS/MOOD NEUROLOGICAL BOWEL NUTRITION STATUS   (N/A)  (N/A) Incontinent  (See DC summary)  AMBULATORY STATUS COMMUNICATION OF NEEDS Skin   Assist extensive Verbally Normal                       Personal Care Assistance Level of Assistance  Bathing, Feeding, Dressing Bathing Assistance: Maximum assistance Feeding assistance: Maximum assistance Dressing Assistance: Maximum assistance     Functional Limitations Info  Sight Sight Info: Impaired        SPECIAL CARE FACTORS FREQUENCY  PT (By licensed PT)     PT Frequency: 3x/week              Contractures      Additional Factors Info  Code Status, Allergies, Insulin Sliding Scale Code Status Info: Full Allergies Info: Statins, Aggrenox, Carvedilol, Claritin, Cymbalta, Lyrica, Mucinex, Other, Phenergan, Plavix, Rapaflo, Travatan Z   Insulin Sliding Scale Info: insulin aspart (novoLOG) injection 0-9 Units 3x/day; insulin glargine (LANTUS) injection 9 Units at bedtime       Current Medications (04/26/2015):  This is the current hospital active medication list Current Facility-Administered Medications  Medication Dose Route Frequency Provider Last Rate Last Dose  . acetaminophen (TYLENOL) tablet 650 mg  650 mg  Oral TID Delfina Redwood, MD   650 mg at 04/25/15 2146  . allopurinol (ZYLOPRIM) tablet 100 mg  100 mg Oral BID Willia Craze, NP   100 mg at 04/25/15 2145  . amiodarone (PACERONE) tablet 200 mg  200 mg Oral QHS Willia Craze, NP   200 mg at 04/25/15 2146  . apixaban (ELIQUIS) tablet 2.5 mg  2.5 mg Oral BID Delfina Redwood, MD   2.5 mg at 04/25/15 1728  . ferrous sulfate tablet 325 mg  325  mg Oral QODAY Willia Craze, NP   325 mg at 04/25/15 [id number]  . HYDROcodone-acetaminophen (NORCO/VICODIN) 5-325 MG per tablet 1-2 tablet  1-2 tablet Oral Q4H PRN Willia Craze, NP   1 tablet at 04/26/15 0015  . insulin aspart (novoLOG) injection 0-9 Units  0-9 Units Subcutaneous TID WC Willia Craze, NP   3 Units at 04/26/15 [phone]  . insulin aspart (novoLOG) injection 10 Units  10 Units Subcutaneous TID WC Delfina Redwood, MD   10 Units at 04/26/15 [phone]  . insulin glargine (LANTUS) injection 10 Units  10 Units Subcutaneous QHS Delfina Redwood, MD   10 Units at 04/25/15 2202  . isosorbide mononitrate (IMDUR) 24 hr tablet 60 mg  60 mg Oral Daily Willia Craze, NP   60 mg at 04/25/15 0933  . latanoprost (XALATAN) 0.005 % ophthalmic solution 1 drop  1 drop Both Eyes QHS Willia Craze, NP   1 drop at 04/24/15 2216  . methocarbamol (ROBAXIN) tablet 500 mg  500 mg Oral Q8H PRN Delfina Redwood, MD      . metoprolol tartrate (LOPRESSOR) tablet 25 mg  25 mg Oral Daily Willia Craze, NP   25 mg at 04/25/15 0933  . morphine 2 MG/ML injection 1 mg  1 mg Intravenous Q4H PRN Willia Craze, NP      . nitroGLYCERIN (NITROSTAT) SL tablet 0.4 mg  0.4 mg Sublingual Q5 min PRN Willia Craze, NP      . ondansetron [facility]) tablet 4 mg  4 mg Oral Q6H PRN Willia Craze, NP       Or  . ondansetron [facility]) injection 4 mg  4 mg Intravenous Q6H PRN Willia Craze, NP      . polyethylene glycol (MIRALAX / GLYCOLAX) packet 17 g  17 g Oral BID Willia Craze, NP   17 g at 04/25/15 2145  . polyvinyl alcohol (LIQUIFILM TEARS) 1.4 % ophthalmic solution 1 drop  1 drop Both Eyes BID Waldemar Dickens, MD   1 drop at 04/24/15 1020  . potassium chloride SA (K-DUR,KLOR-CON) CR tablet 20 mEq  20 mEq Oral Daily Willia Craze, NP   20 mEq at 04/25/15 0933  . tamsulosin (FLOMAX) capsule 0.4 mg  0.4 mg Oral QPC supper Willia Craze, NP   0.4 mg at 04/25/15 1721     Discharge Medications: Please  see discharge summary for a list of discharge medications.  Relevant Imaging Results:  Relevant Lab Results:   Additional Information SSN: [id number]  Cranford Mon, [place]

## 2015-04-26 NOTE — Discharge Instructions (Signed)

## 2015-04-28 ENCOUNTER — Encounter (HOSPITAL_COMMUNITY): Payer: Self-pay | Admitting: Internal Medicine

## 2015-04-28 ENCOUNTER — Emergency Department (HOSPITAL_COMMUNITY): Payer: Medicare Other

## 2015-04-28 ENCOUNTER — Inpatient Hospital Stay (HOSPITAL_COMMUNITY)
Admission: EM | Admit: 2015-04-28 | Discharge: 2015-05-02 | DRG: 553 | Disposition: A | Discharge status: Skilled Nursing Facility | Payer: Medicare Other | Attending: Internal Medicine | Admitting: Internal Medicine

## 2015-04-28 DIAGNOSIS — R338 Other retention of urine: Secondary | ICD-10-CM | POA: Diagnosis present

## 2015-04-28 DIAGNOSIS — Z8673 Personal history of transient ischemic attack (TIA), and cerebral infarction without residual deficits: Secondary | ICD-10-CM | POA: Diagnosis not present

## 2015-04-28 DIAGNOSIS — E1142 Type 2 diabetes mellitus with diabetic polyneuropathy: Secondary | ICD-10-CM

## 2015-04-28 DIAGNOSIS — Z7901 Long term (current) use of anticoagulants: Secondary | ICD-10-CM | POA: Diagnosis not present

## 2015-04-28 DIAGNOSIS — I5032 Chronic diastolic (congestive) heart failure: Secondary | ICD-10-CM | POA: Diagnosis not present

## 2015-04-28 DIAGNOSIS — N401 Enlarged prostate with lower urinary tract symptoms: Secondary | ICD-10-CM | POA: Diagnosis present

## 2015-04-28 DIAGNOSIS — Z950 Presence of cardiac pacemaker: Secondary | ICD-10-CM | POA: Diagnosis not present

## 2015-04-28 DIAGNOSIS — J189 Pneumonia, unspecified organism: Secondary | ICD-10-CM | POA: Diagnosis present

## 2015-04-28 DIAGNOSIS — Z87891 Personal history of nicotine dependence: Secondary | ICD-10-CM | POA: Diagnosis not present

## 2015-04-28 DIAGNOSIS — I272 Other secondary pulmonary hypertension: Secondary | ICD-10-CM | POA: Diagnosis present

## 2015-04-28 DIAGNOSIS — R7989 Other specified abnormal findings of blood chemistry: Secondary | ICD-10-CM | POA: Diagnosis not present

## 2015-04-28 DIAGNOSIS — M109 Gout, unspecified: Secondary | ICD-10-CM | POA: Diagnosis present

## 2015-04-28 DIAGNOSIS — I248 Other forms of acute ischemic heart disease: Secondary | ICD-10-CM | POA: Diagnosis present

## 2015-04-28 DIAGNOSIS — L8990 Pressure ulcer of unspecified site, unspecified stage: Secondary | ICD-10-CM | POA: Insufficient documentation

## 2015-04-28 DIAGNOSIS — M545 Low back pain, unspecified: Secondary | ICD-10-CM | POA: Diagnosis present

## 2015-04-28 DIAGNOSIS — E785 Hyperlipidemia, unspecified: Secondary | ICD-10-CM | POA: Diagnosis present

## 2015-04-28 DIAGNOSIS — I480 Paroxysmal atrial fibrillation: Secondary | ICD-10-CM | POA: Diagnosis not present

## 2015-04-28 DIAGNOSIS — R269 Unspecified abnormalities of gait and mobility: Secondary | ICD-10-CM | POA: Diagnosis present

## 2015-04-28 DIAGNOSIS — M4800 Spinal stenosis, site unspecified: Secondary | ICD-10-CM | POA: Diagnosis present

## 2015-04-28 DIAGNOSIS — R079 Chest pain, unspecified: Secondary | ICD-10-CM | POA: Diagnosis not present

## 2015-04-28 DIAGNOSIS — R627 Adult failure to thrive: Secondary | ICD-10-CM | POA: Insufficient documentation

## 2015-04-28 DIAGNOSIS — N189 Chronic kidney disease, unspecified: Secondary | ICD-10-CM

## 2015-04-28 DIAGNOSIS — I130 Hypertensive heart and chronic kidney disease with heart failure and stage 1 through stage 4 chronic kidney disease, or unspecified chronic kidney disease: Secondary | ICD-10-CM | POA: Diagnosis present

## 2015-04-28 DIAGNOSIS — N184 Chronic kidney disease, stage 4 (severe): Secondary | ICD-10-CM | POA: Diagnosis present

## 2015-04-28 DIAGNOSIS — R5381 Other malaise: Secondary | ICD-10-CM | POA: Diagnosis present

## 2015-04-28 DIAGNOSIS — Z79899 Other long term (current) drug therapy: Secondary | ICD-10-CM | POA: Diagnosis not present

## 2015-04-28 DIAGNOSIS — R0781 Pleurodynia: Secondary | ICD-10-CM | POA: Diagnosis not present

## 2015-04-28 DIAGNOSIS — N138 Other obstructive and reflux uropathy: Secondary | ICD-10-CM | POA: Diagnosis present

## 2015-04-28 DIAGNOSIS — Y95 Nosocomial condition: Secondary | ICD-10-CM | POA: Diagnosis present

## 2015-04-28 DIAGNOSIS — Z794 Long term (current) use of insulin: Secondary | ICD-10-CM | POA: Diagnosis not present

## 2015-04-28 DIAGNOSIS — I25119 Atherosclerotic heart disease of native coronary artery with unspecified angina pectoris: Secondary | ICD-10-CM | POA: Diagnosis present

## 2015-04-28 DIAGNOSIS — I5042 Chronic combined systolic (congestive) and diastolic (congestive) heart failure: Secondary | ICD-10-CM | POA: Diagnosis not present

## 2015-04-28 DIAGNOSIS — Z96652 Presence of left artificial knee joint: Secondary | ICD-10-CM | POA: Diagnosis present

## 2015-04-28 DIAGNOSIS — E1122 Type 2 diabetes mellitus with diabetic chronic kidney disease: Secondary | ICD-10-CM | POA: Diagnosis present

## 2015-04-28 DIAGNOSIS — E869 Volume depletion, unspecified: Secondary | ICD-10-CM | POA: Diagnosis present

## 2015-04-28 DIAGNOSIS — E871 Hypo-osmolality and hyponatremia: Secondary | ICD-10-CM | POA: Diagnosis present

## 2015-04-28 DIAGNOSIS — M21372 Foot drop, left foot: Secondary | ICD-10-CM | POA: Diagnosis present

## 2015-04-28 DIAGNOSIS — R778 Other specified abnormalities of plasma proteins: Secondary | ICD-10-CM

## 2015-04-28 DIAGNOSIS — M160 Bilateral primary osteoarthritis of hip: Secondary | ICD-10-CM | POA: Diagnosis present

## 2015-04-28 DIAGNOSIS — D649 Anemia, unspecified: Secondary | ICD-10-CM | POA: Diagnosis present

## 2015-04-28 HISTORY — DX: Pneumonia, unspecified organism: J18.9

## 2015-04-28 LAB — I-STAT CHEM 8, ED
BUN: 101 mg/dL — ABNORMAL HIGH (ref 6–20)
CHLORIDE: 90 mmol/L — AB (ref 101–111)
Calcium, Ion: 1.13 mmol/L (ref 1.13–1.30)
Creatinine, Ser: 3.5 mg/dL — ABNORMAL HIGH (ref 0.61–1.24)
Glucose, Bld: 206 mg/dL — ABNORMAL HIGH (ref 65–99)
HEMATOCRIT: 36 % — AB (ref 39.0–52.0)
HEMOGLOBIN: 12.2 g/dL — AB (ref 13.0–17.0)
POTASSIUM: 4.4 mmol/L (ref 3.5–5.1)
SODIUM: 132 mmol/L — AB (ref 135–145)
TCO2: 34 mmol/L (ref 0–100)

## 2015-04-28 LAB — CBC WITH DIFFERENTIAL/PLATELET
BASOS PCT: 0 %
Basophils Absolute: 0 10*3/uL (ref 0.0–0.1)
Basophils Absolute: 0 10*3/uL (ref 0.0–0.1)
Basophils Relative: 0 %
EOS ABS: 0 10*3/uL (ref 0.0–0.7)
EOS ABS: 0 10*3/uL (ref 0.0–0.7)
EOS PCT: 0 %
Eosinophils Relative: 0 %
HCT: 33.6 % — ABNORMAL LOW (ref 39.0–52.0)
HEMATOCRIT: 36.2 % — AB (ref 39.0–52.0)
HEMOGLOBIN: 11.3 g/dL — AB (ref 13.0–17.0)
Hemoglobin: 12.2 g/dL — ABNORMAL LOW (ref 13.0–17.0)
LYMPHS ABS: 1.1 10*3/uL (ref 0.7–4.0)
LYMPHS ABS: 1 10*3/uL (ref 0.7–4.0)
Lymphocytes Relative: 8 %
Lymphocytes Relative: 8 %
MCH: 33.4 pg (ref 26.0–34.0)
MCH: 33.3 pg (ref 26.0–34.0)
MCHC: 33.7 g/dL (ref 30.0–36.0)
MCHC: 33.6 g/dL (ref 30.0–36.0)
MCV: 99.2 fL (ref 78.0–100.0)
MCV: 99.1 fL (ref 78.0–100.0)
MONO ABS: 0.9 10*3/uL (ref 0.1–1.0)
MONOS PCT: 6 %
MONOS PCT: 8 %
Monocytes Absolute: 0.9 10*3/uL (ref 0.1–1.0)
Neutro Abs: 11.7 10*3/uL — ABNORMAL HIGH (ref 1.7–7.7)
Neutro Abs: 9.8 10*3/uL — ABNORMAL HIGH (ref 1.7–7.7)
Neutrophils Relative %: 86 %
Neutrophils Relative %: 84 %
PLATELETS: 215 10*3/uL (ref 150–400)
Platelets: 239 10*3/uL (ref 150–400)
RBC: 3.65 MIL/uL — ABNORMAL LOW (ref 4.22–5.81)
RBC: 3.39 MIL/uL — ABNORMAL LOW (ref 4.22–5.81)
RDW: 15.5 % (ref 11.5–15.5)
RDW: 15.8 % — ABNORMAL HIGH (ref 11.5–15.5)
WBC: 13.7 10*3/uL — ABNORMAL HIGH (ref 4.0–10.5)
WBC: 11.7 10*3/uL — ABNORMAL HIGH (ref 4.0–10.5)

## 2015-04-28 LAB — BASIC METABOLIC PANEL
Anion gap: 11 (ref 5–15)
BUN: 110 mg/dL — AB (ref 6–20)
CHLORIDE: 88 mmol/L — AB (ref 101–111)
CO2: 30 mmol/L (ref 22–32)
CREATININE: 3.53 mg/dL — AB (ref 0.61–1.24)
Calcium: 9.7 mg/dL (ref 8.9–10.3)
GFR calc non Af Amer: 14 mL/min — ABNORMAL LOW (ref >60)
GFR, EST AFRICAN AMERICAN: 16 mL/min — AB (ref >60)
Glucose, Bld: 212 mg/dL — ABNORMAL HIGH (ref 65–99)
Potassium: 4.2 mmol/L (ref 3.5–5.1)
SODIUM: 129 mmol/L — AB (ref 135–145)

## 2015-04-28 LAB — URINALYSIS, ROUTINE W REFLEX MICROSCOPIC
Bilirubin Urine: NEGATIVE
Glucose, UA: NEGATIVE mg/dL
HGB URINE DIPSTICK: NEGATIVE
Ketones, ur: NEGATIVE mg/dL
LEUKOCYTES UA: NEGATIVE
NITRITE: NEGATIVE
PROTEIN: NEGATIVE mg/dL
SPECIFIC GRAVITY, URINE: 1.012 (ref 1.005–1.030)
pH: 6.5 (ref 5.0–8.0)

## 2015-04-28 LAB — I-STAT TROPONIN, ED: Troponin i, poc: 0.95 ng/mL (ref 0.00–0.08)

## 2015-04-28 LAB — STREP PNEUMONIAE URINARY ANTIGEN: Strep Pneumo Urinary Antigen: NEGATIVE

## 2015-04-28 LAB — GLUCOSE, CAPILLARY
GLUCOSE-CAPILLARY: 219 mg/dL — AB (ref 65–99)
Glucose-Capillary: 266 mg/dL — ABNORMAL HIGH (ref 65–99)

## 2015-04-28 LAB — TROPONIN I
TROPONIN I: 0.39 ng/mL — AB (ref <0.031)
TROPONIN I: 0.3 ng/mL — AB (ref <0.031)
Troponin I: 0.33 ng/mL — ABNORMAL HIGH (ref <0.031)

## 2015-04-28 LAB — BRAIN NATRIURETIC PEPTIDE: B NATRIURETIC PEPTIDE 5: 470.1 pg/mL — AB (ref 0.0–100.0)

## 2015-04-28 MED ORDER — METOPROLOL TARTRATE 25 MG PO TABS
25.0000 mg | ORAL_TABLET | Freq: Every day | ORAL | Status: DC
  Administered 2015-04-28 – 2015-05-02 (×5): 25 mg via ORAL
  Filled 2015-04-28 (×5): qty 1

## 2015-04-28 MED ORDER — ONDANSETRON HCL 4 MG/2ML IJ SOLN: 4.0000 mg | Freq: Four times a day (QID) | INTRAMUSCULAR | Status: DC | PRN

## 2015-04-28 MED ORDER — FERROUS SULFATE 325 (65 FE) MG PO TABS
325.0000 mg | ORAL_TABLET | ORAL | Status: DC
  Administered 2015-04-28 – 2015-05-02 (×3): 325 mg via ORAL
  Filled 2015-04-28 (×3): qty 1

## 2015-04-28 MED ORDER — ALLOPURINOL 100 MG PO TABS
100.0000 mg | ORAL_TABLET | Freq: Two times a day (BID) | ORAL | Status: DC
  Administered 2015-04-28 – 2015-05-02 (×9): 100 mg via ORAL
  Filled 2015-04-28 (×9): qty 1

## 2015-04-28 MED ORDER — SODIUM CHLORIDE 0.9 % IJ SOLN
3.0000 mL | Freq: Two times a day (BID) | INTRAMUSCULAR | Status: DC
  Administered 2015-04-29 – 2015-05-01 (×6): 3 mL via INTRAVENOUS

## 2015-04-28 MED ORDER — MORPHINE SULFATE (PF) 2 MG/ML IV SOLN
0.5000 mg | Freq: Once | INTRAVENOUS | Status: AC
  Administered 2015-04-28: 0.5 mg via INTRAVENOUS
  Filled 2015-04-28: qty 1

## 2015-04-28 MED ORDER — TORSEMIDE 20 MG PO TABS
80.0000 mg | ORAL_TABLET | Freq: Every day | ORAL | Status: DC
  Filled 2015-04-28: qty 4

## 2015-04-28 MED ORDER — HYDROCODONE-ACETAMINOPHEN 5-325 MG PO TABS
1.0000 | ORAL_TABLET | ORAL | Status: DC | PRN
  Administered 2015-05-01 – 2015-05-02 (×2): 1 via ORAL
  Filled 2015-04-28 (×2): qty 2
  Filled 2015-04-28: qty 1

## 2015-04-28 MED ORDER — SODIUM CHLORIDE 0.9 % IV SOLN
INTRAVENOUS | Status: AC
  Administered 2015-04-28: 21:00:00 via INTRAVENOUS

## 2015-04-28 MED ORDER — APIXABAN 2.5 MG PO TABS
2.5000 mg | ORAL_TABLET | Freq: Two times a day (BID) | ORAL | Status: DC
  Administered 2015-04-28 – 2015-05-02 (×9): 2.5 mg via ORAL
  Filled 2015-04-28 (×9): qty 1

## 2015-04-28 MED ORDER — ACETAMINOPHEN 325 MG PO TABS
650.0000 mg | ORAL_TABLET | Freq: Four times a day (QID) | ORAL | Status: DC | PRN
  Administered 2015-04-28: 650 mg via ORAL
  Filled 2015-04-28: qty 2

## 2015-04-28 MED ORDER — ACETAMINOPHEN 650 MG RE SUPP: 650.0000 mg | Freq: Four times a day (QID) | RECTAL | Status: DC | PRN

## 2015-04-28 MED ORDER — INSULIN GLARGINE 100 UNIT/ML ~~LOC~~ SOLN
9.0000 [IU] | Freq: Every day | SUBCUTANEOUS | Status: DC
  Administered 2015-04-28: 9 [IU] via SUBCUTANEOUS
  Filled 2015-04-28 (×2): qty 0.09

## 2015-04-28 MED ORDER — DEXTROSE 5 % IV SOLN
1.0000 g | Freq: Three times a day (TID) | INTRAVENOUS | Status: DC
  Filled 2015-04-28 (×2): qty 1

## 2015-04-28 MED ORDER — NITROGLYCERIN 0.4 MG SL SUBL
0.4000 mg | SUBLINGUAL_TABLET | SUBLINGUAL | Status: DC | PRN
  Filled 2015-04-28: qty 1

## 2015-04-28 MED ORDER — MORPHINE SULFATE (PF) 2 MG/ML IV SOLN
2.0000 mg | Freq: Once | INTRAVENOUS | Status: DC
Start: 2015-04-28 — End: 2015-04-28

## 2015-04-28 MED ORDER — LATANOPROST 0.005 % OP SOLN: 1.0000 [drp] | Freq: Every day | OPHTHALMIC | Status: DC

## 2015-04-28 MED ORDER — ISOSORBIDE MONONITRATE ER 60 MG PO TB24
60.0000 mg | ORAL_TABLET | Freq: Every day | ORAL | Status: DC
  Administered 2015-04-28 – 2015-05-02 (×5): 60 mg via ORAL
  Filled 2015-04-28 (×5): qty 1

## 2015-04-28 MED ORDER — POTASSIUM CHLORIDE CRYS ER 20 MEQ PO TBCR
20.0000 meq | EXTENDED_RELEASE_TABLET | Freq: Every day | ORAL | Status: DC
  Administered 2015-04-28: 20 meq via ORAL
  Filled 2015-04-28: qty 1

## 2015-04-28 MED ORDER — ONDANSETRON HCL 4 MG PO TABS
4.0000 mg | ORAL_TABLET | Freq: Four times a day (QID) | ORAL | Status: DC | PRN
  Administered 2015-05-01: 4 mg via ORAL
  Filled 2015-04-28: qty 1

## 2015-04-28 MED ORDER — ONDANSETRON HCL 4 MG/2ML IJ SOLN: 4.0000 mg | Freq: Once | INTRAMUSCULAR | Status: DC

## 2015-04-28 MED ORDER — VANCOMYCIN HCL 10 G IV SOLR
1500.0000 mg | INTRAVENOUS | Status: AC
  Administered 2015-04-28: 1500 mg via INTRAVENOUS
  Filled 2015-04-28: qty 1500

## 2015-04-28 MED ORDER — PIPERACILLIN-TAZOBACTAM IN DEX 2-0.25 GM/50ML IV SOLN
2.2500 g | INTRAVENOUS | Status: AC
  Administered 2015-04-28: 2.25 g via INTRAVENOUS
  Filled 2015-04-28: qty 50

## 2015-04-28 MED ORDER — VANCOMYCIN HCL IN DEXTROSE 1-5 GM/200ML-% IV SOLN
1000.0000 mg | INTRAVENOUS | Status: DC
  Administered 2015-04-30: 1000 mg via INTRAVENOUS
  Filled 2015-04-28: qty 200

## 2015-04-28 MED ORDER — CEFEPIME HCL 1 G IJ SOLR
1.0000 g | INTRAMUSCULAR | Status: DC
  Administered 2015-04-28 – 2015-04-29 (×2): 1 g via INTRAVENOUS
  Filled 2015-04-28 (×3): qty 1

## 2015-04-28 MED ORDER — POLYETHYLENE GLYCOL 3350 17 G PO PACK
17.0000 g | PACK | Freq: Two times a day (BID) | ORAL | Status: DC
  Administered 2015-04-28 – 2015-05-01 (×6): 17 g via ORAL
  Filled 2015-04-28 (×8): qty 1

## 2015-04-28 MED ORDER — TAMSULOSIN HCL 0.4 MG PO CAPS
0.4000 mg | ORAL_CAPSULE | Freq: Every day | ORAL | Status: DC
  Administered 2015-04-28 – 2015-05-01 (×4): 0.4 mg via ORAL
  Filled 2015-04-28 (×4): qty 1

## 2015-04-28 MED ORDER — ASPIRIN 81 MG PO CHEW: 324.0000 mg | CHEWABLE_TABLET | Freq: Once | ORAL | Status: DC

## 2015-04-28 MED ORDER — AMIODARONE HCL 200 MG PO TABS
200.0000 mg | ORAL_TABLET | Freq: Every day | ORAL | Status: DC
  Administered 2015-04-28 – 2015-05-01 (×4): 200 mg via ORAL
  Filled 2015-04-28 (×4): qty 1

## 2015-04-28 MED ORDER — METHOCARBAMOL 500 MG PO TABS: 500.0000 mg | ORAL_TABLET | Freq: Three times a day (TID) | ORAL | Status: DC | PRN

## 2015-04-28 NOTE — Progress Notes (Signed)
ANTIBIOTIC CONSULT NOTE - INITIAL  Pharmacy Consult for Vancomycin and Cefepime Indication: HCAP  Allergies  Allergen Reactions  . Statins Other (See Comments)    rhabdomyolisis  . Aggrenox [Aspirin-Dipyridamole Er] Other (See Comments)    Dizziness  . Carvedilol Other (See Comments)    Dizziness  . Claritin [Loratadine] Other (See Comments)    Dizziness & drowsiness   . Cymbalta [Duloxetine Hcl]     Confusion   . Latanoprost Itching    Watery   . Lyrica [Pregabalin] Other (See Comments)    Makes him dizzy. Takes him days to recover.  . Mucinex [Guaifenesin Er] Other (See Comments)    Dizziness, drowsiness  . Other Other (See Comments)    Preservatives in glaucoma gtts cause eye irritation, has to use preservative free  . Phenergan [Promethazine Hcl] Other (See Comments)    Causes severe confusion  . Plavix [Clopidogrel Bisulfate] Other (See Comments)    Dizziness  . Rapaflo [Silodosin] Other (See Comments)    Dizziness for about 30- 45  Minutes At time of preop appointment on 08/26/2014 patient denies any problems with this medication  . Travatan Z [Travoprost] Other (See Comments)    Eye irritation    Patient Measurements:    Vital Signs: Temp: 97.8 F (36.6 C) (12/15 0257) Temp Source: Oral (12/15 0257) BP: 122/56 mmHg (12/15 0630) Pulse Rate: 60 (12/15 0630) Intake/Output from previous day: 12/14 0701 - 12/15 0700 In: 50 [I.V.:50] Out: -  Intake/Output from this shift:    Labs:  Recent Labs  04/26/15 0420 04/26/15 1330 04/28/15 0323 04/28/15 0437  WBC  --   --  13.7*  --   HGB  --  11.9* 12.2* 12.2*  PLT  --   --  239  --   CREATININE 3.61*  --  3.53* 3.50*   Estimated Creatinine Clearance: 12.3 mL/min (by C-G formula based on Cr of 3.5). No results for input(s): VANCOTROUGH, VANCOPEAK, VANCORANDOM, GENTTROUGH, GENTPEAK, GENTRANDOM, TOBRATROUGH, TOBRAPEAK, TOBRARND, AMIKACINPEAK, AMIKACINTROU, AMIKACIN in the last 72 hours.   Microbiology: No  results found for this or any previous visit (from the past 720 hour(s)).  Medical History: Past Medical History  Diagnosis Date  . Hyperlipidemia   . Spinal stenosis   . Anemia   . Carotid artery disease ([place])     a. s/p prior carotid endarterectomy.  [name] PVD (peripheral vascular disease) ([place])   . Hyponatremia     Chronic  . CVA (cerebral infarction)     a. When asked to clarify patient says he was told thumb numbness may be TIA. He does not recall formal stroke. CT head results are only available through [facility], not in [facility].  . CKD (chronic kidney disease) stage 4, GFR 15-29 ml/min (HCC)   . Long term (current) use of anticoagulants   . PAF (paroxysmal atrial fibrillation) ([place])     a. Asymptomatic. On Eliquis. CHADSVASC 7.  . Sick sinus syndrome ([place])     With DDD St. Jude PM, initially placed in 1993 by Dr. Nils Pyle. Device upgrade 10/2002 to DDD, by Dr. Rollene Fare complicated by bleeding. Subsequent gen change 2011.  [name] Hypertensive cardiovascular disease   . Chronic diastolic congestive heart failure ([place])     ECHO 05/20/12 LVEF estimated by 2D at 55-60%  . Arthritis   . Gait disorder   . Diabetic peripheral neuropathy associated with type 2 diabetes mellitus ([place])   . Diabetes mellitus (HCC)     insulin dependent  . Elevated  troponin I level 06/22/2014    a. 06/2014 felt due to demand ischemia.  . Chronic anticoagulation     For PAF, on Eliquis   . Urinary retention   . Pulmonary hypertension ([place])   . Foot drop, left 07/21/2014  . Asterixis 07/21/2014  . Hypertension   . Presence of permanent cardiac pacemaker   . Pneumonia     hx of pneumonia x 1   . Bilateral plantar fasciitis     Medications:  See electronic med rec  Assessment: 81 y.o. M presents from rehab center with CP. Noted that pt's roommate at rehab was diagnoses with PNA a couple of days ago. Pt received Vanc 1.5gm and Zosyn 2.25gm in ED ~0600. To continue vancomycin and start cefepime for HCAP. Pt with CKD stage 4,  est CrCl 12 ml/min. Afeb. WBC elevated to 13.7.  Goal of Therapy:  Vancomycin trough level 15-20 mcg/ml  Plan:  Change Cefepime to 1gm IV q24h Vancomycin 1gm IV q48h Will f/u renal function, micro data, and pt's clinical condition Vanc trough prn  Sherlon Handing, PharmD, BCPS Clinical pharmacist, pager [phone] 04/28/2015,7:23 AM

## 2015-04-28 NOTE — ED Notes (Addendum)
Dr Branch at bedside. ?

## 2015-04-28 NOTE — ED Provider Notes (Signed)
By signing my name below, I, [name], attest that this documentation has been prepared under the direction and in the presence of [employer], DO. Electronically Signed: [name], ED Scribe. 04/28/2015. 3:19 AM.  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 Relative notes that the pt symptoms began with SOB at 9 PM. Chest pressure started around 2 AM without any radiation. He denies getting CP ever before and that this is new for him. He states that his CP is worsened with movement and resolved after 2 nitroglycerin. Chest pain-free currently.  He denies dizziness, nausea, vomiting, diaphoresis, and any other symptoms. 52 has had a cough for the past 2-3 years. No fever. He denies any PMHx of MI. Pt Cardiologist is [name]. He states that he is on Demadex as his diuretic. Denies hx of blood clots in lungs or legs. Pt denies having a cardiac catherization at this time and he is unsure of when his last stress test was. Family at bedside states that his roommate in the [facility] was just diagnosed with pneumonia.    ROS: See HPI Constitutional: no fever  Eyes: no drainage  ENT: no runny nose   Cardiovascular:   chest pain  Resp:  SOB  GI: no vomiting GU: no dysuria Integumentary: no rash  Allergy: no hives  Musculoskeletal: no leg swelling  Neurological: no slurred speech ROS otherwise negative  PAST MEDICAL HISTORY/PAST SURGICAL HISTORY:  Past Medical History  Diagnosis Date  . Hyperlipidemia   . Spinal stenosis   . Anemia   . Carotid artery disease ([place])     a. s/p prior carotid endarterectomy.  [name] PVD (peripheral vascular disease) ([place])   . Hyponatremia     Chronic  . CVA (cerebral infarction)     a. When  asked to clarify patient says he was told thumb numbness may be TIA. He does not recall formal stroke. CT head results are only available through [facility], not in [facility].  . CKD (chronic kidney disease) stage 4, GFR 15-29 ml/min (HCC)   . Long term (current) use of anticoagulants   . PAF (paroxysmal atrial fibrillation) ([place])     a. Asymptomatic. On Eliquis. CHADSVASC 7.  . Sick sinus syndrome ([place])     With DDD St. Jude PM, initially placed in 1993 by Dr. Nils Pyle. Device upgrade 10/2002 to DDD, by Dr. Rollene Fare complicated by bleeding. Subsequent gen change 2011.  [name] Hypertensive cardiovascular disease   . Chronic diastolic congestive heart failure ([place])     ECHO 05/20/12 LVEF estimated by 2D at 55-60%  . Arthritis   . Gait disorder   . Diabetic peripheral neuropathy associated with type 2 diabetes mellitus ([place])   . Diabetes mellitus (HCC)     insulin dependent  . Elevated troponin I level 06/22/2014    a. 06/2014 felt due to demand ischemia.  . Chronic anticoagulation     For PAF, on Eliquis   . Urinary retention   . Pulmonary hypertension ([name])   . Foot drop, left 07/21/2014  . Asterixis 07/21/2014  . Hypertension   . Presence of permanent cardiac pacemaker   . Pneumonia     hx of pneumonia x 1   . Bilateral plantar  fasciitis     MEDICATIONS:  Prior to Admission medications   Medication Sig Start Date End Date Taking? Authorizing Provider  allopurinol (ZYLOPRIM) 100 [name] tablet Take 100 [name] by mouth 2 (two) times daily.     Historical Provider, MD  amiodarone (PACERONE) 200 [name] tablet Take 1 tablet (200 [name] total) by mouth at bedtime. 09/17/14   Belva Crome, MD  apixaban (ELIQUIS) 2.5 [name] TABS tablet Take 1 tablet (2.5 [name] total) by mouth 2 (two) times daily. 12/27/14   Belva Crome, MD  Carboxymethylcellul-Glycerin 0.5-0.9 % SOLN Place 1 drop into both eyes 2 (two) times daily.     Historical Provider, MD  ferrous sulfate 325 (65 FE) [name] tablet Take 325 [name] by mouth every other day.    Historical  Provider, MD  HYDROcodone-acetaminophen (NORCO/VICODIN) 5-325 [name] tablet Take 1-2 tablets by mouth every 4 (four) hours as needed for moderate pain. 04/26/15   Maryann Mikhail, DO  insulin aspart (NOVOLOG FLEXPEN) 100 UNIT/ML FlexPen Inject 9 Units into the skin 3 (three) times daily with meals. Per sliding scale:  CBG <175 no insulin (unless consuming large amounts of carbs; 6-10 units for CBG >175 based on actual CBG and number of carbs in meal; >325 call MD    Historical Provider, MD  insulin glargine (LANTUS) 100 UNIT/ML injection Inject 9 Units into the skin at bedtime. Or as directed by sliding scale.    Historical Provider, MD  isosorbide mononitrate (IMDUR) 60 [name] 24 hr tablet Take 1 tablet (60 [name] total) by mouth daily. 03/07/15   Belva Crome, MD  methocarbamol (ROBAXIN) 500 [name] tablet Take 1 tablet (500 [name] total) by mouth every 8 (eight) hours as needed for muscle spasms. 04/26/15   Maryann Mikhail, DO  metoprolol tartrate (LOPRESSOR) 25 [name] tablet Take 1 tablet (25 [name] total) by mouth daily. 02/28/15   Belva Crome, MD  nitroGLYCERIN (NITROSTAT) 0.4 [name] SL tablet Place 1 tablet (0.4 [name] total) under the tongue every 5 (five) minutes as needed for chest pain. 12/03/13   Belva Crome, MD  OVER THE COUNTER MEDICATION Take 1 packet by mouth daily. Nature Made Diabetic Pak (6 tablet combo)    Historical Provider, MD  polyethylene glycol (MIRALAX / GLYCOLAX) packet Take 17 g by mouth 2 (two) times daily.     Historical Provider, MD  polyvinyl alcohol (LIQUIFILM TEARS) 1.4 % ophthalmic solution Place 1 drop into both eyes 2 (two) times daily. 04/26/15   Maryann Mikhail, DO  potassium chloride SA (K-DUR,KLOR-CON) Darrell MEQ tablet Take 1 tablet (Darrell mEq total) by mouth daily. 09/08/14   Debbe Odea, MD  Tafluprost 0.0015 % SOLN Place 1 drop into both eyes at bedtime. [place]    Historical Provider, MD  tamsulosin (FLOMAX) 0.4 [name] CAPS capsule Take 1 capsule (0.4 [name] total) by mouth daily after supper. 04/19/15    Janece Canterbury, MD  torsemide (DEMADEX) [name] tablet Take 80 [name] by mouth daily. 02/25/15   Historical Provider, MD    ALLERGIES:  Allergies  Allergen Reactions  . Statins Other (See Comments)    rhabdomyolisis  . Aggrenox [Aspirin-Dipyridamole Er] Other (See Comments)    Dizziness  . Carvedilol Other (See Comments)    Dizziness  . Claritin [Loratadine] Other (See Comments)    Dizziness & drowsiness   . Cymbalta [Duloxetine Hcl]     Confusion   . Latanoprost Itching    Watery   . Lyrica [Pregabalin] Other (See Comments)  Makes him dizzy. Takes him days to recover.  . Mucinex [Guaifenesin Er] Other (See Comments)    Dizziness, drowsiness  . Other Other (See Comments)    Preservatives in glaucoma gtts cause eye irritation, has to use preservative free  . Phenergan [Promethazine Hcl] Other (See Comments)    Causes severe confusion  . Plavix [Clopidogrel Bisulfate] Other (See Comments)    Dizziness  . Rapaflo [Silodosin] Other (See Comments)    Dizziness for about 30- 45  Minutes At time of preop appointment on 08/26/2014 patient denies any problems with this medication  . Travatan Z [Travoprost] Other (See Comments)    Eye irritation    SOCIAL HISTORY:  Social History  Substance Use Topics  . Smoking status: Former Smoker -- 2 years    Types: Pipe, Cigarettes    Quit date: 05/14/1958  . Smokeless tobacco: Never Used  . Alcohol Use: Yes     Comment: Cocktails occasionally    FAMILY HISTORY: Family History  Problem Relation Age of Onset  . Multiple myeloma Father   . Hypertension Mother   . Healthy Sister   . COPD Sister   . Heart failure Sister     EXAM: BP 133/76 mmHg  Pulse 60  Temp(Src) 97.8 F (36.6 C) (Oral)  Resp 14  SpO2 100% CONSTITUTIONAL: Alert and oriented and responds appropriately to questions. Elderly, pleasant, no distress.  HEAD: Normocephalic EYES: Conjunctivae clear, PERRL ENT: normal nose; no rhinorrhea; moist mucous membranes;  pharynx without lesions noted NECK: Supple, no meningismus, no LAD  CARD: RRR; S1 and S2 appreciated; no murmurs, no clicks, no rubs, no gallops RESP: Normal chest excursion without splinting or tachypnea; breath sounds clear and equal bilaterally; no wheezes, no rhonchi, no rales, no hypoxia or respiratory distress, speaking full sentences ABD/GI: Normal bowel sounds; non-distended; soft, non-tender, no rebound, no guarding, no peritoneal signs, indwelling Foley catheter in place BACK:  The back appears normal and is non-tender to palpation, there is no CVA tenderness EXT: Normal ROM in all joints; non-tender to palpation; no edema; normal capillary refill; no cyanosis, no calf tenderness or swelling    SKIN: Normal color for age and race; warm. 1 x 1 cm scabbed ulcerated lesion with no erythema, warmth, drainage to left lateral foot. NEURO: Moves all extremities equally, sensation to light touch intact diffusely, cranial nerves II through XII intact PSYCH: The patient's mood and manner are appropriate. Grooming and personal hygiene are appropriate.  MEDICAL DECISION MAKING: Patient here with chest pain that has resolved after 2 nitroglycerin. EKG shows paced rhythm with no new ischemic abnormalities. We'll obtain cardiac labs, chest x-ray. He does not appear significantly volume overloaded on exam.  ED PROGRESS: Patient's labs show leukocytosis with left shift. He is mildly hyponatremic with a sodium of 129. He has chronic kidney disease which is stable. Troponin is elevated at 0.95 in the setting of chronic kidney disease. He always has an elevated troponin but this seems higher than normal. He has received aspirin in the emergency department is still chest pain-free. We'll continue to monitor this and consult cardiology. Chest x-ray shows right lower lobe infiltrate concerning for pneumonia. Given recent positive sick contact, leukocytosis I feel this should be treated. We'll give him vancomycin and  Zosyn as he has been recently admitted and is at risk for healthcare associated pneumonia. Discussed this with patient and family at bedside who agree. Will admit. PCP is Dr. Carlis Abbott.  4:15 AM  D/w Dr. Harl Bowie with cardiology  given his increased troponin from baseline. Patient still chest pain-free. EKG shows paced rhythm. They will see patient in consult. Discussed with Dr. Hal Hope about patient's healthcare associated pneumonia. He agrees with admission to telemetry. Will admit inpatient. I will place holding orders per his request.    EKG Interpretation  Date/Time:  Thursday April 28 2015 02:53:55 EST Ventricular Rate:  147 PR Interval:  183 QRS Duration: 101 QT Interval:  251 QTC Calculation: 392 R Axis:   129 Text Interpretation:  ATRIAL PACED RHYTHM Nonspecific T wave abnormality Probable lateral infarct, age indeterminate No significant change since last tracing Confirmed by Morse Brueggemann,  DO, Anderson Coppock ([id number]) on 04/28/2015 2:56:34 AM        I personally performed the services described in this documentation, which was scribed in my presence. The recorded information has been reviewed and is accurate.     Pound, DO 04/28/15 [phone]

## 2015-04-28 NOTE — ED Notes (Signed)
IV team at bedside 

## 2015-04-28 NOTE — ED Notes (Signed)
RN attempted PIV start x 2 unsucessful

## 2015-04-28 NOTE — ED Notes (Signed)
IV antibiotic paused d/t loss of IV access. IV team consult placed.

## 2015-04-28 NOTE — Progress Notes (Signed)
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 Patient has a history of pacemaker placement, paroxysmal atrial fibrillation, chronic systolic and diastolic CHF, type 2 diabetes. He was found to have mildly elevated troponin. X-ray suggested pneumonia. Patient was hospitalized for further management.  Past medical history:  Past Medical History  Diagnosis Date  . Hyperlipidemia   . Spinal stenosis   . Anemia   . Carotid artery disease ([name])     a. s/p prior carotid endarterectomy.  [name] PVD (peripheral vascular disease) ([name])   . Hyponatremia     Chronic  . CVA (cerebral infarction)     a. When asked to clarify patient says he was told thumb numbness may be TIA. He does not recall formal stroke. CT head results are only available through [facility], not in [facility].  . CKD (chronic kidney disease) stage 4, GFR 15-29 ml/min (HCC)   . Long term (current) use of anticoagulants   . PAF (paroxysmal atrial fibrillation) ([place])     a. Asymptomatic. On Eliquis. CHADSVASC 7.  . Sick sinus syndrome ([name])     With DDD St. Jude PM, initially placed in 1993 by Dr. Nils Pyle. Device upgrade 10/2002 to DDD, by Dr. Rollene Fare complicated by bleeding. Subsequent gen change 2011.  [name] Hypertensive cardiovascular disease   . Chronic diastolic congestive heart failure ([place])     ECHO 05/20/12 LVEF estimated by 2D at 55-60%  . Arthritis   . Gait disorder   . Diabetic peripheral neuropathy associated with type 2 diabetes mellitus ([name])   . Diabetes mellitus (HCC)     insulin dependent  . Elevated troponin I level 06/22/2014    a. 06/2014 felt due to demand ischemia.  . Chronic anticoagulation     For PAF, on Eliquis   . Urinary retention   . Pulmonary hypertension ([name])   . Foot drop, left 07/21/2014   . Asterixis 07/21/2014  . Hypertension   . Presence of permanent cardiac pacemaker   . Pneumonia     hx of pneumonia x 1   . Bilateral plantar fasciitis     Consultants: Cardiology  Procedures: None  Antibiotics: Cefepime and vancomycin  Subjective: Patient slightly somnolent but easily arousable. He was given pain medications recently. He feels well at this time. Denies any shortness of breath. No cough. No fever, no chills. Apparently his roommate at the skilled nursing facility was recently diagnosed with pneumonia.  Objective: Vital Signs  Filed Vitals:   04/28/15 0759 04/28/15 0815 04/28/15 0904 04/28/15 1122  BP:  127/62 129/62 132/61  Pulse:  62 64 61  Temp: 97.9 F (36.6 C)  98 F (36.7 C)   TempSrc: Oral  Oral   Resp:  15 18   SpO2:  95% 95% 93%    Intake/Output Summary (Last 24 hours) at 04/28/15 1248 Last data filed at 04/28/15 [id number]  Gross per 24 hour  Intake     50 ml  Output    275 ml  Net   -225 ml   There were no vitals filed for this visit.  General appearance: alert, distracted and no distress Resp: Diminished air entry at the bases without any wheezing or rhonchi. Few crackles present. Cardio: regular rate and rhythm, S1, S2 normal, no murmur, click, rub or gallop GI: soft, non-tender;  bowel sounds normal; no masses,  no organomegaly Extremities: extremities normal, atraumatic, no cyanosis or edema Neurologic: Slightly somnolent but easily arousable. No obvious focal neurological deficits noted.  Lab Results:  Basic Metabolic Panel:  Recent Labs Lab 04/23/15 0425 04/25/15 0430 04/26/15 0420 04/28/15 0323 04/28/15 0437  NA 132* 130* 130* 129* 132*  K 3.7 3.9 3.9 4.2 4.4  CL 91* 87* 87* 88* 90*  CO2 29 30 32 30  --   GLUCOSE 157* 166* 263* 212* 206*  BUN 77* 94* 101* 110* 101*  CREATININE 3.59* 3.62* 3.61* 3.53* 3.50*  CALCIUM 9.5 9.5 9.5 9.7  --    CBC:  Recent Labs Lab 04/26/15 1330 04/28/15 0323 04/28/15 0437  WBC  --   13.7*  --   NEUTROABS  --  11.7*  --   HGB 11.9* 12.2* 12.2*  HCT 35.1* 36.2* 36.0*  MCV  --  99.2  --   PLT  --  239  --    Cardiac Enzymes:  Recent Labs Lab 04/28/15 0321  TROPONINI 0.39*   BNP (last 3 results)  Recent Labs  10/04/14 1241 02/27/15 0924 04/28/15 0324  BNP 467.9* 325.4* 470.1*     CBG:  Recent Labs Lab 04/25/15 1359 04/25/15 1617 04/25/15 2116 04/26/15 0621 04/26/15 1126  GLUCAP 280* 271* 223* 247* 329*    No results found for this or any previous visit (from the past 240 hour(s)).    Studies/Results: Dg Chest 2 View  04/28/2015  CLINICAL DATA:  Bilateral chest tightness. EXAM: CHEST  2 VIEW COMPARISON:  03/11/2015 FINDINGS: Small bilateral pleural effusions similar to previous studies. Focal airspace disease in the right lung base posteriorly suggests focal pneumonia. Left lung is clear. No pneumothorax. Normal heart size and pulmonary vascularity. Cardiac pacemaker. Calcification of the aorta. Degenerative changes in the spine. IMPRESSION: Focal airspace disease in the right lung base suggesting pneumonia. Small bilateral pleural effusions. Electronically Signed   By: [name] M.D.   On: 04/28/2015 03:35    Medications:  Scheduled: . allopurinol  100 mg Oral BID  . amiodarone  200 mg Oral QHS  . apixaban  2.5 mg Oral BID  . aspirin  324 mg Oral Once  . ceFEPime (MAXIPIME) IV  1 g Intravenous Q24H  . ferrous sulfate  325 mg Oral QODAY  . insulin glargine  9 Units Subcutaneous QHS  . isosorbide mononitrate  60 mg Oral Daily  . latanoprost  1 drop Both Eyes QHS  . metoprolol tartrate  25 mg Oral Daily  . polyethylene glycol  17 g Oral BID  . sodium chloride  3 mL Intravenous Q12H  . tamsulosin  0.4 mg Oral QPC supper  . [START ON 04/30/2015] vancomycin  1,000 mg Intravenous Q48H   Continuous: . sodium chloride 50 mL/hr at 04/28/15 1226   [id number] **OR** acetaminophen, HYDROcodone-acetaminophen, methocarbamol,  nitroGLYCERIN, ondansetron **OR** ondansetron (ZOFRAN) IV  Assessment/Plan:  Principal Problem:   HCAP (healthcare-associated pneumonia) Active Problems:   Paroxysmal atrial fibrillation, DCCV 06/16/13   Pacemaker - St Jude   CKD (chronic kidney disease) stage 4, GFR 15-29 ml/min (HCC)   Chronic diastolic heart failure (HCC)   Low back pain   Chest pain   Elevated troponin    Healthcare associated pneumonia Patient appears to be minimally symptomatic. Continue vancomycin and cefepime for now. Follow-up blood cultures. Since he is afebrile and without any cough. There is no need for influenza PCR.  Chest pain with mildly elevated troponin Cardiology  is following. Continue medical management for now. Do not anticipate invasive testing.  Low back pain Appears to be chronic. Recently seen by neurosurgery and was placed on steroids. Continue narcotics as needed.  Chronic systolic and diastolic congestive heart failure Last measured EF was about 35-40%. Appears to be reasonably well compensated. Diuretics have been held by cardiology for now. Strict ins and outs and daily weights.  Chronic kidney disease stage III Creatinine is at baseline. Continue to monitor urine output.  History of sick sinus syndrome, status post pacemaker Stable.  History of paroxysmal atrial fibrillation Stable. Heart rate is reasonably well controlled. Continue amiodarone, metoprolol and anticoagulation.  Chronic anemia Stable. Continue to monitor.  History of gout Allopurinol  Diabetes mellitus type 2 with chronic kidney disease Continue Lantus with sliding scale coverage.  History of obstructive uropathy with Foley catheter According to patient's caregiver, patient was seen recently by urology. They want him to keep the Foley catheter in until follow-up next week. Continue Flomax.   DVT Prophylaxis: On anticoagulation    Code Status: Full code.   Family Communication: Discussed with the  caregiver who is at the Bedside  Disposition Plan: Continue current treatment as outlined. PT and OT evaluation.    LOS: 0 days   [facility] Pager [phone] 04/28/2015, 12:48 PM  If 7PM-7AM, please contact night-coverage at [email or web address], password [facility]

## 2015-04-28 NOTE — Progress Notes (Signed)
Patient lying in bed, no distress or pain. [name], his caregiver present at bedside.  Call light within reach.

## 2015-04-28 NOTE — Progress Notes (Signed)
Noted order for FLU PCR, pt has no symptoms of flu --no cough no fever no sore throat.  Pt's roommate at SNF/REHAB was diagnosed with pneumonia per pt's caregiver that got info from daughter of pt's roommate at SNF.   Discussed with Dr Maryland Pink, order received to d/c pcr order due to not meeting criteria.

## 2015-04-28 NOTE — Progress Notes (Signed)
UR Completed. Jamieson Hetland, RN, BSN.  [phone] 

## 2015-04-28 NOTE — ED Notes (Signed)
The patient is coming from [facility] for his legs.  He was there started complaining of chest pain radiating to his left arm, pressure.  Denies any other symptoms.  The patient does have a foley that was placed prior to coming to the ED.  The patient received two nitros from the nursing home, and EMS gave one nitro, 324mg  of Aspirin.  Patient was transported here to the ED. The patient is being paced.  The patient rated his pain 8/10 prior to nitro and now he is back up to a 10/10.

## 2015-04-28 NOTE — ED Notes (Signed)
Pt called out for pain medicine, admitting MD aware. IV team also at bedside

## 2015-04-28 NOTE — Consult Note (Signed)
Primary cardiologist: Dr Daneen Schick MD Consulting cardiologist: Dr Carlyle Dolly MD  Clinical Summary [name] is a 81 y.o.male history of chronic diastolic HF, St Jude pacemaker, PAF, stage IV CKD, HTN, DM2 admitted with chest pain. He reports episode of chest pain on both sides of chest, mild sharp pain worst with breathing. Lasted several hours.   Na 129, Cr 3.53, BUN 110, Hgb 12.2, WBC 13.7, BNP 470 (325-470 over last 6 months),  Trop 0.95-->0.39 (chronically elevated around 0.2-0.25 CXR RLL pneumonia EKG a paced v-sensed, chronic nonspecific ST/T changes 02/2015 [place] echo: LVEF 35-40%, restrictive diastolic dysfunction [id number]/6387 echo: LVEF 45-50%   Allergies  Allergen Reactions  . Statins Other (See Comments)    rhabdomyolisis  . Aggrenox [Aspirin-Dipyridamole Er] Other (See Comments)    Dizziness  . Carvedilol Other (See Comments)    Dizziness  . Claritin [Loratadine] Other (See Comments)    Dizziness & drowsiness   . Cymbalta [Duloxetine Hcl]     Confusion   . Latanoprost Itching    Watery   . Lyrica [Pregabalin] Other (See Comments)    Makes him dizzy. Takes him days to recover.  . Mucinex [Guaifenesin Er] Other (See Comments)    Dizziness, drowsiness  . Other Other (See Comments)    Preservatives in glaucoma gtts cause eye irritation, has to use preservative free  . Phenergan [Promethazine Hcl] Other (See Comments)    Causes severe confusion  . Plavix [Clopidogrel Bisulfate] Other (See Comments)    Dizziness  . Rapaflo [Silodosin] Other (See Comments)    Dizziness for about 30- 45  Minutes At time of preop appointment on 08/26/2014 patient denies any problems with this medication  . Travatan Z [Travoprost] Other (See Comments)    Eye irritation    Medications Scheduled Medications: . aspirin  324 mg Oral Once     Infusions: . vancomycin       PRN Medications:     Past Medical History  Diagnosis Date  . Hyperlipidemia   . Spinal  stenosis   . Anemia   . Carotid artery disease ([place])     a. s/p prior carotid endarterectomy.  [name] PVD (peripheral vascular disease) ([place])   . Hyponatremia     Chronic  . CVA (cerebral infarction)     a. When asked to clarify patient says he was told thumb numbness may be TIA. He does not recall formal stroke. CT head results are only available through [facility], not in [facility].  . CKD (chronic kidney disease) stage 4, GFR 15-29 ml/min (HCC)   . Long term (current) use of anticoagulants   . PAF (paroxysmal atrial fibrillation) ([place])     a. Asymptomatic. On Eliquis. CHADSVASC 7.  . Sick sinus syndrome ([place])     With DDD St. Jude PM, initially placed in 1993 by Dr. Nils Pyle. Device upgrade 10/2002 to DDD, by Dr. Rollene Fare complicated by bleeding. Subsequent gen change 2011.  [name] Hypertensive cardiovascular disease   . Chronic diastolic congestive heart failure ([place])     ECHO 05/20/12 LVEF estimated by 2D at 55-60%  . Arthritis   . Gait disorder   . Diabetic peripheral neuropathy associated with type 2 diabetes mellitus ([place])   . Diabetes mellitus (HCC)     insulin dependent  . Elevated troponin I level 06/22/2014    a. 06/2014 felt due to demand ischemia.  . Chronic anticoagulation     For PAF, on Eliquis   . Urinary retention   .  Pulmonary hypertension ([place])   . Foot drop, left 07/21/2014  . Asterixis 07/21/2014  . Hypertension   . Presence of permanent cardiac pacemaker   . Pneumonia     hx of pneumonia x 1   . Bilateral plantar fasciitis     Past Surgical History  Procedure Laterality Date  . Lumbar laminectomy  1995  . Back surgery    . Total knee arthroplasty Left   . Quadriceps tendon repair    . Cataract extraction    . Carotid endarterectomy    . Yag laser application Right 8/84/1660    Procedure: YAG LASER CAPSULOTOMY OF RIGHT EYE;  Surgeon: [name], MD;  Location: [facility];  Service: Ophthalmology;  Laterality: Right;  . Tonsillectomy    . Pacemaker insertion      DDD  St. Jude PM, initially placed in 1993 by Dr. Nils Pyle. Device upgrade 10/2002 to DDD, by Dr. Rollene Fare complicated by bleeding.  . Carotid endarterectomy Right 2006  . Transurethral resection of prostate    . Cardioversion N/A 06/16/2013    Procedure: CARDIOVERSION BEDSIDE;  Surgeon: [name], MD;  Location: [facility];  Service: Cardiovascular;  Laterality: N/A;  . Pacemaker generator change  12/09/2009    SJM Accent DR RF gen change by Dr [name]  . Insert / replace / remove pacemaker      2004   . Transurethral resection of prostate N/A 08/27/2014    Procedure: TRANSURETHRAL RESECTION OF THE PROSTATE WITH GYRUS INSTRUMENTS;  Surgeon: [name], MD;  Location: [facility];  Service: Urology;  Laterality: N/A;  . Cystoscopy N/A 08/27/2014    Procedure: CYSTOSCOPY;  Surgeon: [name], MD;  Location: [facility];  Service: Urology;  Laterality: N/A;    Family History  Problem Relation Age of Onset  . Multiple myeloma Father   . Hypertension Mother   . Healthy Sister   . COPD Sister   . Heart failure Sister     Social History [name] reports that he quit smoking about 56 years ago. His smoking use included Pipe and Cigarettes. He quit after 2 years of use. He has never used smokeless tobacco. [name] reports that he drinks alcohol.  Review of Systems CONSTITUTIONAL: No weight loss, fever, chills, weakness or fatigue.  HEENT: Eyes: No visual loss, blurred vision, double vision or yellow sclerae. No hearing loss, sneezing, congestion, runny nose or sore throat.  SKIN: No rash or itching.  CARDIOVASCULAR: per hpi RESPIRATORY: No shortness of breath, cough or sputum.  GASTROINTESTINAL: No anorexia, nausea, vomiting or diarrhea. No abdominal pain or blood.  GENITOURINARY: no polyuria, no dysuria NEUROLOGICAL: No headache, dizziness, syncope, paralysis, ataxia, numbness or tingling in the extremities. No change in bowel or bladder control.  MUSCULOSKELETAL: No muscle, back pain, joint pain or  stiffness.  HEMATOLOGIC: No anemia, bleeding or bruising.  LYMPHATICS: No enlarged nodes. No history of splenectomy.  PSYCHIATRIC: No history of depression or anxiety.      Physical Examination Blood pressure 134/62, pulse 64, temperature 97.8 F (36.6 C), temperature source Oral, resp. rate 15, SpO2 100 %. No intake or output data in the 24 hours ending 04/28/15 0532  HEENT: sclera clear  Cardiovascular: RRR, no m/r/g, no jvd  Respiratory: CTAB  GI: abdomen soft, NT, ND  MSK: no LE edema  Neuro: no focal deficits  Psych: appropriate affect   Lab Results  Basic Metabolic Panel:  Recent Labs Lab 04/23/15 0425 04/25/15 0430 04/26/15 0420 04/28/15  5300 04/28/15 0437  NA 132* 130* 130* 129* 132*  K 3.7 3.9 3.9 4.2 4.4  CL 91* 87* 87* 88* 90*  CO2 29 30 32 30  --   GLUCOSE 157* 166* 263* 212* 206*  BUN 77* 94* 101* 110* 101*  CREATININE 3.59* 3.62* 3.61* 3.53* 3.50*  CALCIUM 9.5 9.5 9.5 9.7  --     Liver Function Tests: No results for input(s): AST, ALT, ALKPHOS, BILITOT, PROT, ALBUMIN in the last 168 hours.  CBC:  Recent Labs Lab 04/26/15 1330 04/28/15 0323 04/28/15 0437  WBC  --  13.7*  --   NEUTROABS  --  11.7*  --   HGB 11.9* 12.2* 12.2*  HCT 35.1* 36.2* 36.0*  MCV  --  99.2  --   PLT  --  239  --     Cardiac Enzymes:  Recent Labs Lab 04/28/15 0321  TROPONINI 0.39*    BNP: Invalid input(s): POCBNP     Impression/Recommendations 1. Chest pain - atypical chest pain in setting of pneumonia.  - mild trop by istat of 0.95, repeat lab trop 0.39 in setting of CKD and pneumonia. Patient with chronic troponin around 0.2-0.25 - EKG with chronic ST/T changes - echo 02/2015 [place] at outside hospital shows LVEF has dropped. We will repeat to confirm finding and evaluate for any new changes - probable demand ischemia in the setting of likely chronic CAD as opposed to acute obstructive disease. Given his age, fragility, and poor renal function  would treat medically only  2. Afib - continue rate control with lopressor as well as amiodarone and anticoag.    Carlyle Dolly, M.D.

## 2015-04-28 NOTE — H&P (Addendum)
[facility] History and Physical  Darrell Mulder, MD JFH:[id number] DOB: [birth year] DOA: 04/28/2015  Referring physician: Dr. Leonides Schanz. PCP: [name], MD  Specialists: [name] cardiologist.  Chief Complaint: Chest pain.  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 Patient states last night patient started having chest pressure across the chest. Denies any associated shortness of breath productive cough fever chills or diaphoresis. Patient's chest pressure improved by the time patient reached ER. Chest x-ray shows infiltrates concerning for pneumonia. Patient was just recently discharged from hospital 2 days ago for low back pain. Patient also had urinary retention for which Foley catheter was placed. Troponins are mildly increased and on-call cardiologist was consulted by the ER physician and at this time patient has been admitted for further management of chest pain and pneumonia. Patient has low back pain which radiates to lower extremity at times episodic and spasmodic.   Review of Systems: As presented in the history of presenting illness, rest negative.  Past Medical History  Diagnosis Date  . Hyperlipidemia   . Spinal stenosis   . Anemia   . Carotid artery disease ([place])     a. s/p prior carotid endarterectomy.  [name] PVD (peripheral vascular disease) ([place])   . Hyponatremia     Chronic  . CVA (cerebral infarction)     a. When asked to clarify patient says he was told thumb numbness may be TIA. He does not recall formal stroke. CT head results are only available through [facility], not in [facility].  . CKD (chronic kidney disease) stage 4, GFR 15-29 ml/min (HCC)    . Long term (current) use of anticoagulants   . PAF (paroxysmal atrial fibrillation) ([place])     a. Asymptomatic. On Eliquis. CHADSVASC 7.  . Sick sinus syndrome ([place])     With DDD St. Jude PM, initially placed in 1993 by Dr. Nils Pyle. Device upgrade 10/2002 to DDD, by Dr. Rollene Fare complicated by bleeding. Subsequent gen change 2011.  [name] Hypertensive cardiovascular disease   . Chronic diastolic congestive heart failure ([place])     ECHO 05/20/12 LVEF estimated by 2D at 55-60%  . Arthritis   . Gait disorder   . Diabetic peripheral neuropathy associated with type 2 diabetes mellitus ([place])   . Diabetes mellitus (HCC)     insulin dependent  . Elevated troponin I level 06/22/2014    a. 06/2014 felt due to demand ischemia.  . Chronic anticoagulation     For PAF, on Eliquis   . Urinary retention   . Pulmonary hypertension ([place])   . Foot drop, left 07/21/2014  . Asterixis 07/21/2014  . Hypertension   . Presence of permanent cardiac pacemaker   . Pneumonia     hx of pneumonia x 1   . Bilateral plantar fasciitis    Past Surgical History  Procedure Laterality Date  . Lumbar laminectomy  1995  . Back surgery    . Total knee arthroplasty Left   . Quadriceps tendon repair    . Cataract extraction    . Carotid endarterectomy    . Yag laser application Right 9/37/3428    Procedure: YAG LASER CAPSULOTOMY OF RIGHT EYE;  Surgeon: [name], MD;  Location: [facility];  Service: Ophthalmology;  Laterality: Right;  . Tonsillectomy    . Pacemaker insertion      DDD St. Jude PM, initially placed in 1993 by Dr. Nils Pyle. Device upgrade 10/2002 to DDD, by Dr. Rollene Fare complicated by bleeding.  . Carotid endarterectomy Right 2006  . Transurethral resection of prostate    . Cardioversion N/A 06/16/2013    Procedure: CARDIOVERSION BEDSIDE;  Surgeon: [name], MD;  Location: [facility];  Service: Cardiovascular;  Laterality: N/A;  . Pacemaker generator change  12/09/2009    SJM Accent DR RF gen change by  Dr [name]  . Insert / replace / remove pacemaker      2004   . Transurethral resection of prostate N/A 08/27/2014    Procedure: TRANSURETHRAL RESECTION OF THE PROSTATE WITH GYRUS INSTRUMENTS;  Surgeon: [name], MD;  [facility];  Service: Urology;  Laterality: N/A;  . Cystoscopy N/A 08/27/2014    Procedure: CYSTOSCOPY;  Surgeon: [name], MD;  [facility];  Service: Urology;  Laterality: N/A;   Social History:  reports that he quit smoking about 56 years ago. His smoking use included Pipe and Cigarettes. He quit after 2 years of use. He has never used smokeless tobacco. He reports that he drinks alcohol. He reports that he does not use illicit drugs. Where does patient live at home. Presently in rehabilitation. Can patient participate in ADLs? Not sure.  Allergies  Allergen Reactions  . Statins Other (See Comments)    rhabdomyolisis  . Aggrenox [Aspirin-Dipyridamole Er] Other (See Comments)    Dizziness  . Carvedilol Other (See Comments)    Dizziness  . Claritin [Loratadine] Other (See Comments)    Dizziness & drowsiness   . Cymbalta [Duloxetine Hcl]     Confusion   . Latanoprost Itching    Watery   . Lyrica [Pregabalin] Other (See Comments)    Makes him dizzy. Takes him days to recover.  . Mucinex [Guaifenesin Er] Other (See Comments)    Dizziness, drowsiness  . Other Other (See Comments)    Preservatives in glaucoma gtts cause eye irritation, has to use preservative free  . Phenergan [Promethazine Hcl] Other (See Comments)    Causes severe confusion  . Plavix [Clopidogrel Bisulfate] Other (See Comments)    Dizziness  . Rapaflo [Silodosin] Other (See Comments)    Dizziness for about 30- 45  Minutes At time of preop appointment on 08/26/2014 patient denies any problems with this medication  . Travatan Z [Travoprost] Other (See Comments)    Eye irritation    Family History:  Family History  Problem Relation Age of Onset  . Multiple myeloma Father   .  Hypertension Mother   . Healthy Sister   . COPD Sister   . Heart failure Sister       Prior to Admission medications   Medication Sig Start Date End Date Taking? Authorizing Provider  allopurinol (ZYLOPRIM) 100 MG tablet Take 100 mg by mouth 2 (two) times daily.    Yes Historical Provider, MD  amiodarone (PACERONE) 200 MG tablet Take 1 tablet (200 mg total) by mouth at bedtime. 09/17/14  Yes Belva Crome, MD  apixaban (ELIQUIS) 2.5 MG TABS tablet Take 1 tablet (2.5 mg total) by mouth 2 (two) times daily. 12/27/14  Yes Belva Crome, MD  Carboxymethylcellul-Glycerin 0.5-0.9 % SOLN Place 1 drop into both eyes 2 (two) times daily.    Yes Historical Provider, MD  ferrous sulfate 325 (65  FE) MG tablet Take 325 mg by mouth every other day.   Yes Historical Provider, MD  HYDROcodone-acetaminophen (NORCO/VICODIN) 5-325 MG tablet Take 1-2 tablets by mouth every 4 (four) hours as needed for moderate pain. 04/26/15  Yes Maryann Mikhail, DO  insulin aspart (NOVOLOG FLEXPEN) 100 UNIT/ML FlexPen Inject 9 Units into the skin 3 (three) times daily with meals. Per sliding scale:  CBG <175 no insulin (unless consuming large amounts of carbs; 6-10 units for CBG >175 based on actual CBG and number of carbs in meal; >325 call MD   Yes Historical Provider, MD  insulin glargine (LANTUS) 100 UNIT/ML injection Inject 9 Units into the skin at bedtime. Or as directed by sliding scale.   Yes Historical Provider, MD  isosorbide mononitrate (IMDUR) 60 MG 24 hr tablet Take 1 tablet (60 mg total) by mouth daily. 03/07/15  Yes Belva Crome, MD  methocarbamol (ROBAXIN) 500 MG tablet Take 1 tablet (500 mg total) by mouth every 8 (eight) hours as needed for muscle spasms. 04/26/15  Yes Maryann Mikhail, DO  metoprolol tartrate (LOPRESSOR) 25 MG tablet Take 1 tablet (25 mg total) by mouth daily. 02/28/15  Yes Belva Crome, MD  nitroGLYCERIN (NITROSTAT) 0.4 MG SL tablet Place 1 tablet (0.4 mg total) under the tongue every 5 (five)  minutes as needed for chest pain. 12/03/13  Yes Belva Crome, MD  OVER THE COUNTER MEDICATION Take 1 packet by mouth daily. Nature Made Diabetic Pak (6 tablet combo)   Yes Historical Provider, MD  polyethylene glycol (MIRALAX / GLYCOLAX) packet Take 17 g by mouth 2 (two) times daily.    Yes Historical Provider, MD  polyvinyl alcohol (LIQUIFILM TEARS) 1.4 % ophthalmic solution Place 1 drop into both eyes 2 (two) times daily. 04/26/15  Yes Maryann Mikhail, DO  potassium chloride SA (K-DUR,KLOR-CON) 20 MEQ tablet Take 1 tablet (20 mEq total) by mouth daily. 09/08/14  Yes Debbe Odea, MD  Tafluprost 0.0015 % SOLN Place 1 drop into both eyes at bedtime. ZIOPTAN   Yes Historical Provider, MD  tamsulosin (FLOMAX) 0.4 MG CAPS capsule Take 1 capsule (0.4 mg total) by mouth daily after supper. 04/19/15  Yes Janece Canterbury, MD  torsemide (DEMADEX) 20 MG tablet Take 80 mg by mouth daily. 02/25/15  Yes Historical Provider, MD    Physical Exam: Filed Vitals:   04/28/15 0505 04/28/15 0515 04/28/15 0530 04/28/15 0545  BP: 113/65 134/62 123/70 117/65  Pulse: 60 64 59 60  Temp:      TempSrc:      Resp: _0 SpO2: 100%  100% 100%     General:  Moderately built and nourished.  Eyes: Anicteric no pallor.  ENT: No discharge from the ears eyes nose and mouth.  Neck: No mass felt. No JVD appreciated.  Cardiovascular: S1-S2 heard.  Respiratory: No rhonchi or crepitations.  Abdomen: Soft nontender bowel sounds present.  Skin: No rash.  Musculoskeletal: No edema. Patient has significant low back pain and moving lower extremities.  Psychiatric: Appears normal.  Neurologic: Alert awake oriented to time place and person. Left foot drop. Moves all extremities. No facial asymmetry.  Labs on Admission:  Basic Metabolic Panel:  Recent Labs Lab 04/23/15 0425 04/25/15 0430 04/26/15 0420 04/28/15 0323 04/28/15 0437  NA 132* 130* 130* 129* 132*  K 3.7 3.9 3.9 4.2 4.4  CL 91* 87* 87* 88* 90*   CO2 29 30 32 30  --   GLUCOSE 157* 166* 263* 212* 206*  BUN 77* 94* 101* 110* 101*  CREATININE 3.59* 3.62* 3.61* 3.53* 3.50*  CALCIUM 9.5 9.5 9.5 9.7  --    Liver Function Tests: No results for input(s): AST, ALT, ALKPHOS, BILITOT, PROT, ALBUMIN in the last 168 hours. No results for input(s): LIPASE, AMYLASE in the last 168 hours. No results for input(s): AMMONIA in the last 168 hours. CBC:  Recent Labs Lab 04/26/15 1330 04/28/15 0323 04/28/15 0437  WBC  --  13.7*  --   NEUTROABS  --  11.7*  --   HGB 11.9* 12.2* 12.2*  HCT 35.1* 36.2* 36.0*  MCV  --  99.2  --   PLT  --  239  --    Cardiac Enzymes:  Recent Labs Lab 04/28/15 0321  TROPONINI 0.39*    BNP (last 3 results)  Recent Labs  10/04/14 1241 02/27/15 0924 04/28/15 0324  BNP 467.9* 325.4* 470.1*    ProBNP (last 3 results)  Recent Labs  07/16/14 1213  PROBNP 460.0*    CBG:  Recent Labs Lab 04/25/15 1359 04/25/15 1617 04/25/15 2116 04/26/15 0621 04/26/15 1126  GLUCAP 280* 271* 223* 247* 329*    Radiological Exams on Admission: Dg Chest 2 View  04/28/2015  CLINICAL DATA:  Bilateral chest tightness. EXAM: CHEST  2 VIEW COMPARISON:  03/11/2015 FINDINGS: Small bilateral pleural effusions similar to previous studies. Focal airspace disease in the right lung base posteriorly suggests focal pneumonia. Left lung is clear. No pneumothorax. Normal heart size and pulmonary vascularity. Cardiac pacemaker. Calcification of the aorta. Degenerative changes in the spine. IMPRESSION: Focal airspace disease in the right lung base suggesting pneumonia. Small bilateral pleural effusions. Electronically Signed   By: [name] M.D.   On: 04/28/2015 03:35    EKG: Independently reviewed. Paced rhythm.  Assessment/Plan Principal Problem:   HCAP (healthcare-associated pneumonia) Active Problems:   Paroxysmal atrial fibrillation, DCCV 06/16/13   Pacemaker - St Jude   CKD (chronic kidney disease) stage 4, GFR  15-29 ml/min (HCC)   Chronic diastolic heart failure (HCC)   Low back pain   Chest pain   1. Healthcare associated pneumonia - patient has no productive cough but patient's health caregiver states that patient's roommate had pneumonia last 2 days. At this time patient has been placed on vancomycin and cefepime for healthcare associated pneumonia. Follow blood cultures. Check influenza PCR. 2. Chest pain with positive troponin - patient is on Apixaban which will be continued. Patient is also on Imdur, beta blockers. We will cycle cardiac markers. I have requested cardiology consult. 3. Low back pain - neurosurgery had recently consulted and has requested steroid and patient's back pain is probably from degenerative disease. Patient is also on hydrocodone which will be continued as needed. 4. Chronic systolic and diastolic heart failure last EF measured was 35-40% - appears compensated. Continue diuretics. Closely follow intake and output and daily weights. 5. Chronic kidney disease stage III - creatinine appears to be at baseline. Follow metabolic panel. 6. Sick Sinus Syndrome status post pacemaker placement. 7. Paroxysmal atrial fibrillation - patient is on Apixaban, amiodarone and beta blockers. Chads 2 vasc score is more than 2. 8. Chronic anemia follow CBC. 9. History of gout on allopurinol. 10. Diabetes mellitus type 2 on Lantus with sliding scale coverage. 11. Obstructive uropathy with Foley catheter.  I have reviewed patient's old charts and labs. Personally reviewed x-ray and EKG.   DVT Prophylaxis Apixaban.  Code Status: Full code.  Family Communication: Discussed with patient.  Disposition Plan:  Admit to inpatient.    Eily Louvier N. [facility] Pager [phone].  If 7PM-7AM, please contact night-coverage [email or web address] Password [email or web address] 04/28/2015, 6:04 AM

## 2015-04-28 NOTE — Progress Notes (Signed)
   Dr. Kennon Holter is looking very volume contracted based upon skin turgor, azotemia, dry membranes, and lethargy.  Discontinue diuretic therapy and start slow hydration.  Follow volume status and renal function. If we are slow to resume diuretic therapy he will develop CHF rapidly due to severe diastolic heart failure.

## 2015-04-29 ENCOUNTER — Encounter (HOSPITAL_COMMUNITY): Payer: Self-pay | Admitting: General Practice

## 2015-04-29 DIAGNOSIS — I5042 Chronic combined systolic (congestive) and diastolic (congestive) heart failure: Secondary | ICD-10-CM

## 2015-04-29 DIAGNOSIS — I480 Paroxysmal atrial fibrillation: Secondary | ICD-10-CM

## 2015-04-29 DIAGNOSIS — L8990 Pressure ulcer of unspecified site, unspecified stage: Secondary | ICD-10-CM | POA: Insufficient documentation

## 2015-04-29 DIAGNOSIS — J189 Pneumonia, unspecified organism: Secondary | ICD-10-CM

## 2015-04-29 LAB — GLUCOSE, CAPILLARY
GLUCOSE-CAPILLARY: 280 mg/dL — AB (ref 65–99)
GLUCOSE-CAPILLARY: 247 mg/dL — AB (ref 65–99)
GLUCOSE-CAPILLARY: 322 mg/dL — AB (ref 65–99)
Glucose-Capillary: 227 mg/dL — ABNORMAL HIGH (ref 65–99)
Glucose-Capillary: 128 mg/dL — ABNORMAL HIGH (ref 65–99)

## 2015-04-29 LAB — BASIC METABOLIC PANEL
Anion gap: 9 (ref 5–15)
BUN: 95 mg/dL — ABNORMAL HIGH (ref 6–20)
CO2: 31 mmol/L (ref 22–32)
Calcium: 9.1 mg/dL (ref 8.9–10.3)
Chloride: 94 mmol/L — ABNORMAL LOW (ref 101–111)
Creatinine, Ser: 3.1 mg/dL — ABNORMAL HIGH (ref 0.61–1.24)
GFR calc Af Amer: 18 mL/min — ABNORMAL LOW (ref >60)
GFR calc non Af Amer: 16 mL/min — ABNORMAL LOW (ref >60)
Glucose, Bld: 271 mg/dL — ABNORMAL HIGH (ref 65–99)
POTASSIUM: 3.9 mmol/L (ref 3.5–5.1)
Sodium: 134 mmol/L — ABNORMAL LOW (ref 135–145)

## 2015-04-29 LAB — LEGIONELLA ANTIGEN, URINE

## 2015-04-29 LAB — TROPONIN I: Troponin I: 0.29 ng/mL — ABNORMAL HIGH (ref <0.031)

## 2015-04-29 LAB — URINE CULTURE: CULTURE: NO GROWTH

## 2015-04-29 LAB — CBC
HEMATOCRIT: 34.7 % — AB (ref 39.0–52.0)
Hemoglobin: 11.6 g/dL — ABNORMAL LOW (ref 13.0–17.0)
MCH: 33.7 pg (ref 26.0–34.0)
MCHC: 33.4 g/dL (ref 30.0–36.0)
MCV: 100.9 fL — ABNORMAL HIGH (ref 78.0–100.0)
PLATELETS: 239 10*3/uL (ref 150–400)
RBC: 3.44 MIL/uL — ABNORMAL LOW (ref 4.22–5.81)
RDW: 16.1 % — AB (ref 11.5–15.5)
WBC: 10.5 10*3/uL (ref 4.0–10.5)

## 2015-04-29 MED ORDER — CARBOXYMETHYLCELLULOSE SODIUM 1 % OP SOLN
1.0000 [drp] | Freq: Every day | OPHTHALMIC | Status: DC
  Administered 2015-04-29 – 2015-05-01 (×3): 1 [drp] via OPHTHALMIC

## 2015-04-29 MED ORDER — INSULIN GLARGINE 100 UNIT/ML ~~LOC~~ SOLN
12.0000 [IU] | Freq: Every day | SUBCUTANEOUS | Status: DC
  Administered 2015-04-29 – 2015-05-01 (×3): 12 [IU] via SUBCUTANEOUS
  Filled 2015-04-29 (×4): qty 0.12

## 2015-04-29 MED ORDER — CARBOXYMETHYLCELLUL-GLYCERIN 0.5-0.9 % OP SOLN
1.0000 [drp] | Freq: Two times a day (BID) | OPHTHALMIC | Status: DC | PRN
  Administered 2015-04-29 – 2015-05-01 (×3): 1 [drp] via OPHTHALMIC

## 2015-04-29 MED ORDER — INSULIN ASPART 100 UNIT/ML ~~LOC~~ SOLN
0.0000 [IU] | Freq: Three times a day (TID) | SUBCUTANEOUS | Status: DC
  Administered 2015-04-29: 5 [IU] via SUBCUTANEOUS
  Administered 2015-04-29: 11 [IU] via SUBCUTANEOUS
  Administered 2015-04-30 (×2): 2 [IU] via SUBCUTANEOUS
  Administered 2015-04-30 – 2015-05-01 (×2): 3 [IU] via SUBCUTANEOUS
  Administered 2015-05-01 – 2015-05-02 (×3): 2 [IU] via SUBCUTANEOUS

## 2015-04-29 MED ORDER — INSULIN ASPART 100 UNIT/ML ~~LOC~~ SOLN
4.0000 [IU] | Freq: Three times a day (TID) | SUBCUTANEOUS | Status: DC
  Administered 2015-04-29 – 2015-05-02 (×8): 4 [IU] via SUBCUTANEOUS

## 2015-04-29 MED ORDER — TAFLUPROST 0.0015 % OP SOLN
1.0000 [drp] | Freq: Every day | OPHTHALMIC | Status: DC
  Administered 2015-04-29 – 2015-05-01 (×3): 1 [drp] via OPHTHALMIC

## 2015-04-29 NOTE — Care Management Note (Signed)
Case Management Note  Patient Details  Name: Darrell KOLAR, MD MRN: [id number] Date of Birth: 26-Aug-1918  Subjective/Objective:    Pt admitted with HCAP                Action/Plan:  Pt is from SNF with care giver at bedside.  CSW consulted and monitoring for disposition.  / PT evaluated-  Plan is for pt to return to SNF post discharge.   Expected Discharge Date:                  Expected Discharge Plan:     In-House Referral:     Discharge planning Services     Post Acute Care Choice:    Choice offered to:     DME Arranged:    DME Agency:     HH Arranged:    [place] Agency:     Status of Service:     Medicare Important Message Given:    Date Medicare IM Given:    Medicare IM give by:    Date Additional Medicare IM Given:    Additional Medicare Important Message give by:     If discussed at [place] of Stay Meetings, dates discussed:    Additional Comments:  Maryclare Labrador, RN 04/29/2015, 3:51 PM

## 2015-04-29 NOTE — Progress Notes (Signed)
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 Patient has a history of pacemaker placement, paroxysmal atrial fibrillation, chronic systolic and diastolic CHF, type 2 diabetes. He was found to have mildly elevated troponin. X-ray suggested pneumonia. Patient was hospitalized for further management.  Past medical history:  Past Medical History  Diagnosis Date  . Hyperlipidemia   . Spinal stenosis   . Anemia   . Carotid artery disease ([place])     a. s/p prior carotid endarterectomy.  [name] PVD (peripheral vascular disease) ([place])   . Hyponatremia     Chronic  . CVA (cerebral infarction)     a. When asked to clarify patient says he was told thumb numbness may be TIA. He does not recall formal stroke. CT head results are only available through [facility], not in [facility].  . CKD (chronic kidney disease) stage 4, GFR 15-29 ml/min (HCC)   . Long term (current) use of anticoagulants   . PAF (paroxysmal atrial fibrillation) ([place])     a. Asymptomatic. On Eliquis. CHADSVASC 7.  . Sick sinus syndrome ([place])     With DDD St. Jude PM, initially placed in 1993 by Dr. Nils Pyle. Device upgrade 10/2002 to DDD, by Dr. Rollene Fare complicated by bleeding. Subsequent gen change 2011.  [name] Hypertensive cardiovascular disease   . Chronic diastolic congestive heart failure ([place])     ECHO 05/20/12 LVEF estimated by 2D at 55-60%  . Arthritis   . Gait disorder   . Diabetic peripheral neuropathy associated with type 2 diabetes mellitus ([place])   . Diabetes mellitus (HCC)     insulin dependent  . Elevated troponin I level 06/22/2014    a. 06/2014 felt due to demand ischemia.  . Chronic anticoagulation     For PAF, on Eliquis   . Urinary retention   . Pulmonary hypertension ([place])   . Foot drop, left 07/21/2014   . Asterixis 07/21/2014  . Hypertension   . Presence of permanent cardiac pacemaker   . Pneumonia     hx of pneumonia x 1   . Bilateral plantar fasciitis     Consultants: Cardiology  Procedures: None  Antibiotics: Cefepime and vancomycin  Subjective: Patient somnolent but easily arousable. Denies any complaints this morning.   Objective: Vital Signs  Filed Vitals:   04/28/15 1412 04/28/15 2055 04/29/15 0624 04/29/15 1024  BP: 115/56 125/57 131/62 131/52  Pulse: 64 62 65 65  Temp: 98.2 F (36.8 C) 98.5 F (36.9 C) 98.3 F (36.8 C)   TempSrc: Oral Oral Oral   Resp: 17 16 15    SpO2: 99% 94% 98%     Intake/Output Summary (Last 24 hours) at 04/29/15 1034 Last data filed at 04/28/15 1900  Gross per 24 hour  Intake 978.33 ml  Output    980 ml  Net  -1.67 ml   There were no vitals filed for this visit.  General appearance: alert, distracted and no distress Resp: Diminished air entry at the bases without any wheezing or rhonchi.  Cardio: regular rate and rhythm, S1, S2 normal, no murmur, click, rub or gallop GI: soft, non-tender; bowel sounds normal; no masses,  no organomegaly Extremities: extremities normal, atraumatic, no cyanosis or edema Neurologic: somnolent but easily arousable. No obvious focal neurological deficits noted.  Lab Results:  Basic Metabolic Panel:  Recent Labs Lab 04/23/15 0425 04/25/15 0430 04/26/15 0420 04/28/15 0323 04/28/15 0437 04/29/15 0345  NA 132* 130* 130* 129* 132* 134*  K 3.7 3.9 3.9 4.2 4.4 3.9  CL 91* 87* 87* 88* 90* 94*  CO2 29 30 32 30  --  31  GLUCOSE 157* 166* 263* 212* 206* 271*  BUN 77* 94* 101* 110* 101* 95*  CREATININE 3.59* 3.62* 3.61* 3.53* 3.50* 3.10*  CALCIUM 9.5 9.5 9.5 9.7  --  9.1   CBC:  Recent Labs Lab 04/26/15 1330 04/28/15 0323 04/28/15 0437 04/28/15 1234 04/29/15 0345  WBC  --  13.7*  --  11.7* 10.5  NEUTROABS  --  11.7*  --  9.8*  --   HGB 11.9* 12.2* 12.2* 11.3* 11.6*  HCT 35.1* 36.2*  36.0* 33.6* 34.7*  MCV  --  99.2  --  99.1 100.9*  PLT  --  239  --  215 239   Cardiac Enzymes:  Recent Labs Lab 04/28/15 0321 04/28/15 1234 04/28/15 1833 04/28/15 2335  TROPONINI 0.39* 0.30* 0.33* 0.29*   BNP (last 3 results)  Recent Labs  10/04/14 1241 02/27/15 0924 04/28/15 0324  BNP 467.9* 325.4* 470.1*     CBG:  Recent Labs Lab 04/26/15 1126 04/28/15 1247 04/28/15 1653 04/28/15 2113 04/29/15 0627  GLUCAP 329* 219* 266* 280* 247*    Recent Results (from the past 240 hour(s))  Urine culture     Status: None   Collection Time: 04/28/15  6:11 AM  Result Value Ref Range Status   Specimen Description URINE, RANDOM  Final   Special Requests NONE  Final   Culture NO GROWTH 1 DAY  Final   Report Status 04/29/2015 FINAL  Final      Studies/Results: Dg Chest 2 View  04/28/2015  CLINICAL DATA:  Bilateral chest tightness. EXAM: CHEST  2 VIEW COMPARISON:  03/11/2015 FINDINGS: Small bilateral pleural effusions similar to previous studies. Focal airspace disease in the right lung base posteriorly suggests focal pneumonia. Left lung is clear. No pneumothorax. Normal heart size and pulmonary vascularity. Cardiac pacemaker. Calcification of the aorta. Degenerative changes in the spine. IMPRESSION: Focal airspace disease in the right lung base suggesting pneumonia. Small bilateral pleural effusions. Electronically Signed   By: [name] M.D.   On: 04/28/2015 03:35    Medications:  Scheduled: . allopurinol  100 mg Oral BID  . amiodarone  200 mg Oral QHS  . apixaban  2.5 mg Oral BID  . aspirin  324 mg Oral Once  . ceFEPime (MAXIPIME) IV  1 g Intravenous Q24H  . ferrous sulfate  325 mg Oral QODAY  . insulin glargine  9 Units Subcutaneous QHS  . isosorbide mononitrate  60 mg Oral Daily  . latanoprost  1 drop Both Eyes QHS  . metoprolol tartrate  25 mg Oral Daily  . polyethylene glycol  17 g Oral BID  . sodium chloride  3 mL Intravenous Q12H  . tamsulosin  0.4  mg Oral QPC supper  . [START ON 04/30/2015] vancomycin  1,000 mg Intravenous Q48H   Continuous: . sodium chloride 50 mL/hr at 04/28/15 2043   [id number] **OR** acetaminophen, HYDROcodone-acetaminophen, methocarbamol, nitroGLYCERIN, ondansetron **OR** ondansetron (ZOFRAN) IV  Assessment/Plan:  Principal Problem:   HCAP (healthcare-associated pneumonia) Active Problems:   Paroxysmal atrial fibrillation, DCCV 06/16/13   Pacemaker - St Jude   CKD (chronic kidney disease) stage 4, GFR 15-29 ml/min (HCC)   Chronic diastolic heart failure (HCC)   Low back pain  Chest pain   Elevated troponin   Pressure ulcer    Healthcare associated pneumonia Patient appears to be minimally symptomatic. Continue vancomycin and cefepime for now. Follow-up blood cultures. Since he is afebrile and without any cough there is no need for influenza PCR. If blood cultures remain negative by tomorrow, we will transition him to [facility].  Chest pain with mildly elevated troponin Cardiology is following. Continue medical management for now. Do not anticipate invasive testing.  Low back pain Appears to be chronic. Recently seen by neurosurgery and was placed on steroids. Continue narcotics as needed.  Chronic systolic and diastolic congestive heart failure Last measured EF was about 35-40%. Thought to be volume depleted by cardiology and given IV fluids. His diuretics were held. Defer to them.   Chronic kidney disease stage III Creatinine has improved with hydration. Continue to monitor urine output.  History of sick sinus syndrome, status post pacemaker Stable.  History of paroxysmal atrial fibrillation Stable. Heart rate is reasonably well controlled. Continue amiodarone, metoprolol and anticoagulation with Apixaban.  Chronic anemia Stable. Continue to monitor.  History of gout Allopurinol  Diabetes mellitus type 2 with chronic kidney disease Continue Lantus with sliding scale  coverage.  History of obstructive uropathy with Foley catheter According to patient's caregiver, patient was seen recently by urology. They want him to keep the Foley catheter in until follow-up next week. Continue Flomax.   DVT Prophylaxis: On anticoagulation    Code Status: Full code.   Family Communication: No family noted at bedside. Disposition Plan: Continue current treatment as outlined. Await PT and OT evaluation. Will likely need to return to SNF when improved.    LOS: 1 day   [facility] Pager [phone] 04/29/2015, 10:34 AM  If 7PM-7AM, please contact night-coverage at [email or web address], password [facility]

## 2015-04-29 NOTE — Evaluation (Signed)
Occupational Therapy Evaluation and Discharge Patient Details Name: LEVITICUS GUPTE, MD MRN: [id number] DOB: 07/16/1918 Today's Date: 04/29/2015    History of Present Illness Patient is a 81 y/o male presents from SNF with chest pain. Found to have mildly elevated troponin. X-ray suggested pneumonia. PMH includes back surgery, TKA, spinal stenosis, CAD, PVD, CVA, HTN, DM, pulmonary HTN, pacemaker, CKD, just d/ced from hospital 12/13.   Clinical Impression   Pt has been dependent in mobility and ADL since recent admission. He comes from [facility] where he was to receive rehab.  Pt requires +2 assist for all mobility and demonstrates decreased sitting balance and inability to stand. He demonstrates some impairment in cognition and intermittent lethargy. Pt reporting dizziness, but not able to get an accurate BP. Recommend OT at SNF, will defer.    Follow Up Recommendations  SNF;Supervision/Assistance - 24 hour    Equipment Recommendations       Recommendations for Other Services       Precautions / Restrictions Precautions Precautions: Fall Precaution Comments: knees buckle Restrictions Weight Bearing Restrictions: No      Mobility Bed Mobility Overal bed mobility: Needs Assistance Bed Mobility: Rolling;Sidelying to Sit;Sit to Sidelying Rolling: Max assist;+2 for physical assistance Sidelying to sit: Total assist;+2 for physical assistance;HOB elevated     Sit to sidelying: +2 for physical assistance;Total assist General bed mobility comments: Not much initiation. Total A to get to EOB swinging hips around using chuck pad, assist with elevating trunk and scooting bottom to EOB. Total A to return to supine. Cues for sequencing.  Transfers Overall transfer level: Needs assistance Equipment used: 2 person hand held assist Transfers: Sit to/from Stand Sit to Stand: Total assist;+2 physical assistance         General transfer comment: Total A of 2 to boost from EOB  with therapist blocking bil knees due to buckling and cues for hand placement. Not able to stand fully upright. Back pain.    Balance Overall balance assessment: Needs assistance Sitting-balance support: Feet supported;Single extremity supported Sitting balance-Leahy Scale: Fair Sitting balance - Comments: Pt need intermittent Min-Mod A to maintain balance but able to sit unsupported towards end while performing LE AROM.     Standing balance-Leahy Scale: Zero                              ADL Overall ADL's : Needs assistance/impaired Eating/Feeding: Minimal assistance;Bed level   Grooming: Sitting;Moderate assistance   Upper Body Bathing: Total assistance;Sitting   Lower Body Bathing: Total assistance;Bed level   Upper Body Dressing : Maximal assistance;Sitting   Lower Body Dressing: Total assistance;Bed level                 General ADL Comments: Sat EOB x 10-12 progressed from mod assist to min guard.     Vision     Perception     Praxis      Pertinent Vitals/Pain Pain Assessment: Faces Faces Pain Scale: Hurts whole lot Pain Location: back Pain Descriptors / Indicators: Grimacing;Moaning Pain Intervention(s): Limited activity within patient's tolerance;Monitored during session;Repositioned     Hand Dominance Right   Extremity/Trunk Assessment Upper Extremity Assessment Upper Extremity Assessment: Generalized weakness;LUE deficits/detail LUE Deficits / Details: reports rotator cuff tear   Lower Extremity Assessment Lower Extremity Assessment: Defer to PT evaluation   Cervical / Trunk Assessment Cervical / Trunk Assessment: Kyphotic   Communication Communication Communication: HOH   Cognition Arousal/Alertness:  Awake/alert;Lethargic (closes eyes frequently) Behavior During Therapy: Flat affect Overall Cognitive Status: No family/caregiver present to determine baseline cognitive functioning (pt oriented to month and place, conversation  convoluted)                     General Comments       Exercises       Shoulder Instructions      Home Living Family/patient expects to be discharged to:: Skilled nursing facility                                        Prior Functioning/Environment Level of Independence: Needs assistance  Gait / Transfers Assistance Needed: Pt reports not being to walk at SNF then later reports he walked down the hall with RW?? ADL's / Homemaking Assistance Needed: pt has been dependent in bathing, dressing and mobility at SNF, can self feed        OT Diagnosis: Generalized weakness;Cognitive deficits;Acute pain   OT Problem List: Decreased strength;Decreased activity tolerance;Impaired balance (sitting and/or standing);Decreased coordination;Decreased cognition;Decreased knowledge of use of DME or AE;Cardiopulmonary status limiting activity;Pain;Impaired UE functional use   OT Treatment/Interventions:      OT Goals(Current goals can be found in the care plan section) Acute Rehab OT Goals Patient Stated Goal: get up and have a BM  OT Frequency:     Barriers to D/C:            Co-evaluation PT/OT/SLP Co-Evaluation/Treatment: Yes Reason for Co-Treatment: Other (comment);Complexity of the patient's impairments (multi-system involvement)   OT goals addressed during session: ADL's and self-care      End of Session Nurse Communication: Need for lift equipment (pt on bedpan, has skin breakdown on buttocks)  Activity Tolerance: Patient limited by fatigue Patient left: in bed;with call bell/phone within reach;with bed alarm set   Time: [id number] OT Time Calculation (min): 30 min Charges:  OT General Charges $OT Visit: 1 Procedure OT Evaluation $Initial OT Evaluation Tier I: 1 Procedure G-Codes:    Malka So 04/29/2015, 2:51 PM  [phone]

## 2015-04-29 NOTE — Progress Notes (Signed)
Inpatient Diabetes Program Recommendations  AACE/ADA: New Consensus Statement on Inpatient Glycemic Control (2015)  Target Ranges:  Prepandial:   less than 140 mg/dL      Peak postprandial:   less than 180 mg/dL (1-2 hours)      Critically ill patients:  140 - 180 mg/dL   Review of Glycemic Control:  Results for [name], [name], MD (MRN [id number]) as of 04/29/2015 09:14  Ref. Range 04/28/2015 12:47 04/28/2015 16:53 04/28/2015 21:13 04/29/2015 06:27  Glucose-Capillary Latest Ref Range: 65-99 mg/dL 219 (H) 266 (H) 280 (H) 247 (H)   Diabetes history: Type 2 diabets Outpatient Diabetes medications: Lantus 9 units q HS, Novolog Flexpen 9 units tid with meals Current orders for Inpatient glycemic control:  Lantus 9 units q HS Inpatient Diabetes Program Recommendations:    Please consider adding Novolog sensitive tid with meals and HS.  Also consider adding Novolog meal coverage 3 units tid with meals.   Thanks, Adah Perl, RN, BC-ADM Inpatient Diabetes Coordinator Pager [phone] (8a-5p)

## 2015-04-29 NOTE — NC FL2 (Signed)
[place] LEVEL OF CARE SCREENING TOOL     IDENTIFICATION  Patient Name: Darrell Mulder, MD Birthdate: [birth year] Sex: male Admission Date (Current Location): 04/28/2015  [facility] and [place] Number:     Facility and Address:  [facility], [place] [address], [place], [place] [address]      Provider Number: [id number]  Attending Physician Name and Address:  Bonnielee Haff, MD  Relative Name and Phone Number:       Current Level of Care: Hospital Recommended Level of Care: [place] Prior Approval Number:    Date Approved/Denied:   PASRR Number: [id number]  Discharge Plan: SNF    Current Diagnoses: Patient Active Problem List   Diagnosis Date Noted  . Pressure ulcer 04/29/2015  . HCAP (healthcare-associated pneumonia) 04/28/2015  . Chest pain 04/28/2015  . Elevated troponin 04/28/2015  . Pain in the chest   . Osteoarthritis of hip 04/25/2015  . Abnormality of gait 04/21/2015  . Back pain 04/20/2015  . Severe comorbid illness 04/20/2015  . Gait abnormality 04/20/2015  . Low back pain   . Lactic acidosis 04/18/2015  . AKI (acute kidney injury) ([place]) 04/18/2015  . Chronic systolic CHF (congestive heart failure) ([place]) 04/18/2015  . Constipation 04/18/2015  . SSS (sick sinus syndrome) ([place]) 04/18/2015  . Bilateral plantar fasciitis 03/01/2015  . Bilateral leg and foot pain 02/27/2015  . Foot pain, bilateral 02/27/2015  . Dehydration 10/04/2014  . Vertigo 10/04/2014  . Urinary retention due to benign prostatic hyperplasia 08/27/2014  . Foot drop, left 07/21/2014  . Leg weakness 07/16/2014  . Chronic diastolic heart failure ([place]) 04/16/2014  . Essential hypertension   . Gait disorder 03/31/2014  . Solitary pulmonary nodule 10/09/2013  . CKD (chronic kidney disease) stage 4, GFR 15-29 ml/min (HCC)   . Long term current use of amiodarone 06/25/2013  . Chronic anticoagulation 04/03/2013    Class: Chronic  .  Pacemaker - St Jude 04/03/2013    Class: Chronic  . Paroxysmal atrial fibrillation, DCCV 06/16/13 08/22/2012    Class: Chronic  . Hypertensive cardiovascular disease   . Type 2 diabetes mellitus with peripheral neuropathy (HCC)     Orientation RESPIRATION BLADDER Height & Weight    Self, Time, Situation, Place  Normal Continent 5\' 4"  (162.6 cm) 175 lbs.  BEHAVIORAL SYMPTOMS/MOOD NEUROLOGICAL BOWEL NUTRITION STATUS  Other (Comment) (n/a)  (n/a) Continent Diet (Please see discharge summary.)  AMBULATORY STATUS COMMUNICATION OF NEEDS Skin   Extensive Assist Verbally PU Stage and Appropriate Care   PU Stage 2 Dressing:  (Foam dressing)                   Personal Care Assistance Level of Assistance  Bathing, Feeding, Dressing Bathing Assistance: Maximum assistance Feeding assistance: Independent Dressing Assistance: Maximum assistance     Functional Limitations Info   (n/a) Sight Info:  (n/a)        [place]  PT (By licensed PT), OT (By licensed OT)     PT Frequency: 5 OT Frequency: 5            Contractures      Additional Factors Info  Code Status, Allergies Code Status Info: FULL Allergies Info: Statins, Latanoprost, Lyrica Pregabalin, Aggrenox Aspirin-dipyridamole Er, Carvedilol, Claritin Loratadine, Cymbalta Duloxetine Hcl, Mucinex Guaifenesin Er, Phenergan Promethazine Hcl, Plavix Clopidogrel Bisulfate, Rapaflo Silodosin, Travatan Z Travoprost           Current Medications (04/29/2015):  This is  the current hospital active medication list Current Facility-Administered Medications  Medication Dose Route Frequency Provider Last Rate Last Dose  . acetaminophen (TYLENOL) tablet 650 mg  650 mg Oral Q6H PRN Rise Patience, MD   650 mg at 04/28/15 1132   Or  . acetaminophen (TYLENOL) suppository 650 mg  650 mg Rectal Q6H PRN Rise Patience, MD      . allopurinol (ZYLOPRIM) tablet 100 mg  100 mg Oral BID Rise Patience, MD    100 mg at 04/29/15 1024  . amiodarone (PACERONE) tablet 200 mg  200 mg Oral QHS Rise Patience, MD   200 mg at 04/28/15 2242  . apixaban (ELIQUIS) tablet 2.5 mg  2.5 mg Oral BID Rise Patience, MD   2.5 mg at 04/29/15 1024  . Carboxymethylcellul-Glycerin 0.5-0.9 % (Refresh Optive)  1 drop Both Eyes BID PRN Bonnielee Haff, MD      . carboxymethylcellulose 1 % (Refresh Liquigel)  1 drop Both Eyes QHS Bonnielee Haff, MD      . ceFEPIme (MAXIPIME) 1 g in dextrose 5 % 50 mL IVPB  1 g Intravenous Q24H Franky Macho, RPH   1 g at 04/29/15 1019  . ferrous sulfate tablet 325 mg  325 mg Oral QODAY Rise Patience, MD   325 mg at 04/28/15 1134  . HYDROcodone-acetaminophen (NORCO/VICODIN) 5-325 MG per tablet 1-2 tablet  1-2 tablet Oral Q4H PRN Rise Patience, MD      . insulin aspart (novoLOG) injection 0-15 Units  0-15 Units Subcutaneous TID WC Bonnielee Haff, MD   11 Units at 04/29/15 1253  . insulin aspart (novoLOG) injection 4 Units  4 Units Subcutaneous TID WC Bonnielee Haff, MD      . insulin glargine (LANTUS) injection 12 Units  12 Units Subcutaneous QHS Bonnielee Haff, MD      . isosorbide mononitrate (IMDUR) 24 hr tablet 60 mg  60 mg Oral Daily Rise Patience, MD   60 mg at 04/29/15 1024  . methocarbamol (ROBAXIN) tablet 500 mg  500 mg Oral Q8H PRN Rise Patience, MD      . metoprolol tartrate (LOPRESSOR) tablet 25 mg  25 mg Oral Daily Rise Patience, MD   25 mg at 04/29/15 1024  . nitroGLYCERIN (NITROSTAT) SL tablet 0.4 mg  0.4 mg Sublingual Q5 min PRN Rise Patience, MD      . ondansetron [facility]) tablet 4 mg  4 mg Oral Q6H PRN Rise Patience, MD       Or  . ondansetron [facility]) injection 4 mg  4 mg Intravenous Q6H PRN Rise Patience, MD      . polyethylene glycol (MIRALAX / GLYCOLAX) packet 17 g  17 g Oral BID Rise Patience, MD   17 g at 04/29/15 1024  . sodium chloride 0.9 % injection 3 mL  3 mL Intravenous Q12H Rise Patience, MD   3 mL  at 04/29/15 1027  . Tafluprost 0.0015 % SOLN 1 drop  1 drop Both Eyes QHS Bonnielee Haff, MD      . tamsulosin (FLOMAX) capsule 0.4 mg  0.4 mg Oral QPC supper Rise Patience, MD   0.4 mg at 04/28/15 1752  . [START ON 04/30/2015] vancomycin (VANCOCIN) IVPB 1000 mg/200 mL premix  1,000 mg Intravenous Q48H Franky Macho, [facility]         Discharge Medications: Please see discharge summary for a list of discharge medications.  Relevant Imaging Results:  Relevant Lab Results:   Additional Information Social Security #: [id number]  Luna Kitchens [phone]

## 2015-04-29 NOTE — Progress Notes (Signed)
PROGRESS NOTE  Subjective:   81 yo, retired Engineer, drilling,  Hx of PAF, pacer, chronic combined systolic and diastolic CHF, DM Admitted with pneumonia   Objective:    Vital Signs:   Temp:  [98.2 F (36.8 C)-98.5 F (36.9 C)] 98.3 F (36.8 C) (12/16 0624) Pulse Rate:  [61-65] 65 (12/16 0624) Resp:  [15-17] 15 (12/16 0624) BP: (115-132)/(56-62) 131/62 mmHg (12/16 0624) SpO2:  [93 %-99 %] 98 % (12/16 0624)      24-hour weight change: Weight change:   Weight trends: There were no vitals filed for this visit.  Intake/Output:  12/15 0701 - 12/16 0700 In: 978.3 [P.O.:600; I.V.:328.3; IV Piggyback:50] Out: [id number] [Urine:1255]     Physical Exam: BP 131/62 mmHg  Pulse 65  Temp(Src) 98.3 F (36.8 C) (Oral)  Resp 15  SpO2 98%  Wt Readings from Last 3 Encounters:  04/26/15 175 lb 9.6 oz (79.652 kg)  04/19/15 176 lb (79.833 kg)  03/01/15 184 lb 1.4 oz (83.5 kg)    General: Vital signs reviewed and noted.   Head: Normocephalic, atraumatic.  Eyes: conjunctivae/corneas clear.  EOM's intact.   Throat: normal  Neck:  normal   Lungs:    clear antiorly   Heart:  RR   Abdomen:  Soft, non-tender, non-distended    Extremities: No edema,   Decreased skin turgur  Neurologic: A&O X3, CN II - XII are grossly intact.   Psych: Answers questions,  Seems a bit lethargic today     Labs: BMET:  Recent Labs  04/28/15 0323 04/28/15 0437 04/29/15 0345  NA 129* 132* 134*  K 4.2 4.4 3.9  CL 88* 90* 94*  CO2 30  --  31  GLUCOSE 212* 206* 271*  BUN 110* 101* 95*  CREATININE 3.53* 3.50* 3.10*  CALCIUM 9.7  --  9.1    Liver function tests: No results for input(s): AST, ALT, ALKPHOS, BILITOT, PROT, ALBUMIN in the last 72 hours. No results for input(s): LIPASE, AMYLASE in the last 72 hours.  CBC:  Recent Labs  04/28/15 0323  04/28/15 1234 04/29/15 0345  WBC 13.7*  --  11.7* 10.5  NEUTROABS 11.7*  --  9.8*  --   HGB 12.2*  < > 11.3* 11.6*  HCT 36.2*  < > 33.6* 34.7*    MCV 99.2  --  99.1 100.9*  PLT 239  --  215 239  < > = values in this interval not displayed.  Cardiac Enzymes:  Recent Labs  04/28/15 0321 04/28/15 1234 04/28/15 1833 04/28/15 2335  TROPONINI 0.39* 0.30* 0.33* 0.29*    Coagulation Studies: No results for input(s): LABPROT, INR in the last 72 hours.  Other: Invalid input(s): POCBNP No results for input(s): DDIMER in the last 72 hours. No results for input(s): HGBA1C in the last 72 hours. No results for input(s): CHOL, HDL, LDLCALC, TRIG, CHOLHDL in the last 72 hours. No results for input(s): TSH, T4TOTAL, T3FREE, THYROIDAB in the last 72 hours.  Invalid input(s): FREET3 No results for input(s): VITAMINB12, FOLATE, FERRITIN, TIBC, IRON, RETICCTPCT in the last 72 hours.   Other results:  EKG  ( personally reviewed )  -  A pacing    Medications:    Infusions: . sodium chloride 50 mL/hr at 04/28/15 2043    Scheduled Medications: . allopurinol  100 mg Oral BID  . amiodarone  200 mg Oral QHS  . apixaban  2.5 mg Oral BID  . aspirin  324 mg Oral Once  .  ceFEPime (MAXIPIME) IV  1 g Intravenous Q24H  . ferrous sulfate  325 mg Oral QODAY  . insulin glargine  9 Units Subcutaneous QHS  . isosorbide mononitrate  60 mg Oral Daily  . latanoprost  1 drop Both Eyes QHS  . metoprolol tartrate  25 mg Oral Daily  . polyethylene glycol  17 g Oral BID  . sodium chloride  3 mL Intravenous Q12H  . tamsulosin  0.4 mg Oral QPC supper  . [START ON 04/30/2015] vancomycin  1,000 mg Intravenous Q48H    Assessment/ Plan:   Principal Problem:   HCAP (healthcare-associated pneumonia) Active Problems:   Paroxysmal atrial fibrillation, DCCV 06/16/13   Pacemaker - St Jude   CKD (chronic kidney disease) stage 4, GFR 15-29 ml/min (HCC)   Chronic diastolic heart failure (HCC)   Low back pain   Chest pain   Elevated troponin  1.   Chronic combined CHF:  He still seems a bit volume depleted.  Agree with IV F for another day .  Will need  to watch closely for signs of CHF  2. Pneumonia _ per int. Med.    Disposition:  Length of Stay: 1  Thayer Headings, Brooke Bonito., MD, [facility] 04/29/2015, 10:18 AM Office [phone] Pager [phone]

## 2015-04-29 NOTE — Evaluation (Signed)
Physical Therapy Evaluation Patient Details Name: Darrell LEAVINS, MD MRN: [id number] DOB: Jan 30, 1919 Today's Date: 04/29/2015   History of Present Illness  Patient is a 81 y/o male presents from SNF with chest pain. Found to have mildly elevated troponin. X-ray suggested pneumonia. PMH includes back surgery, TKA, spinal stenosis, CAD, PVD, CVA, HTN, DM, pulmonary HTN, pacemaker, CKD, just d/ced from hospital 12/13.  Clinical Impression  Patient presents with functional limitations due to deficits listed in PT problem list (see below). Pt with generalized weakness, dizziness, impaired sitting balance and impaired functional mobility. Requires total A of 2 to attempt standing however not able to get fully upright. Would benefit from return to [facility] to maximize independence, ease burden of care and improve mobility.    Follow Up Recommendations SNF    Equipment Recommendations  None recommended by PT    Recommendations for Other Services       Precautions / Restrictions Precautions Precautions: Fall Restrictions Weight Bearing Restrictions: No      Mobility  Bed Mobility Overal bed mobility: Needs Assistance Bed Mobility: Rolling Rolling: Max assist;+2 for physical assistance Sidelying to sit: Total assist;+2 for physical assistance;HOB elevated       General bed mobility comments: Not much initiation. Total A to get to EOB swinging hips around using chuck pad, assist with elevating trunk and scooting bottom to EOB. Total A to return to supine. Cues for sequencing.  Transfers Overall transfer level: Needs assistance Equipment used: 2 person hand held assist Transfers: Sit to/from Stand Sit to Stand: Total assist;+2 physical assistance         General transfer comment: Total A of 2 to boost from EOB with therapist blocking bil knees due to buckling and cues for hand placement. Not able to stand fully upright. Back pain.  Ambulation/Gait                Stairs             Wheelchair Mobility    Modified Rankin (Stroke Patients Only)       Balance Overall balance assessment: Needs assistance Sitting-balance support: Feet supported;Single extremity supported Sitting balance-Leahy Scale: Fair Sitting balance - Comments: Pt need intermittent Min-Mod A to maintain balance but able to sit unsupported towards end while performing LE AROM.     Standing balance-Leahy Scale: Zero                               Pertinent Vitals/Pain Pain Assessment: Faces Faces Pain Scale: Hurts whole lot Pain Location: back Pain Descriptors / Indicators: Grimacing;Moaning Pain Intervention(s): Monitored during session;Repositioned;Limited activity within patient's tolerance    Home Living Family/patient expects to be discharged to:: Skilled nursing facility                      Prior Function Level of Independence: Needs assistance   Gait / Transfers Assistance Needed: Pt reports not being to walk at SNF then later reports he walked down the hall with RW??           Hand Dominance   Dominant Hand: Right    Extremity/Trunk Assessment   Upper Extremity Assessment: Defer to OT evaluation;Generalized weakness           Lower Extremity Assessment: Generalized weakness      Cervical / Trunk Assessment: Kyphotic  Communication   Communication: HOH  Cognition Arousal/Alertness: Awake/alert;Lethargic (Lethargic at times during session  with eyes closing.) Behavior During Therapy: WFL for tasks assessed/performed Overall Cognitive Status: No family/caregiver present to determine baseline cognitive functioning (Mixed answers to questions asked. )                      General Comments      Exercises General Exercises - Lower Extremity Long Arc Quad: Both;5 reps;Seated      Assessment/Plan    PT Assessment Patient needs continued PT services  PT Diagnosis Generalized weakness;Difficulty walking;Acute pain    PT Problem List Decreased strength;Pain;Decreased activity tolerance;Decreased balance;Decreased mobility;Decreased cognition  PT Treatment Interventions Balance training;Functional mobility training;Therapeutic activities;Therapeutic exercise;Wheelchair mobility training;Patient/family education;Gait training   PT Goals (Current goals can be found in the Care Plan section) Acute Rehab PT Goals Patient Stated Goal: get up and have a BM PT Goal Formulation: With patient Time For Goal Achievement: 05/13/15 Potential to Achieve Goals: Fair    Frequency Min 2X/week   Barriers to discharge        Co-evaluation               End of Session   Activity Tolerance: Patient limited by fatigue;Patient limited by lethargy Patient left: in bed;with call bell/phone within reach;with nursing/sitter in room Nurse Communication: Mobility status;Need for lift equipment         Time: [id number] PT Time Calculation (min) (ACUTE ONLY): 31 min   Charges:   PT Evaluation $Initial PT Evaluation Tier I: 1 Procedure     PT G Codes:        Omolola Mittman A Dolphus Linch 04/29/2015, 2:20 PM Wray Kearns, [place], DPT [phone]

## 2015-04-29 NOTE — Clinical Social Work Note (Signed)
Clinical Social Work Assessment  Patient Details  Name: Darrell MCCORKEL, [name] MRN: [id number] Date of Birth: 07/31/1918  Date of referral:  04/29/15               Reason for consult:  Facility Placement (Return to SNF vs new SNF vs Home)                Permission sought to share information with:  Facility Sport and exercise psychologist, Family Supports Permission granted to share information::  Yes, Verbal Permission Granted  Name::     [place], Daughter, Son, private caregiver  Agency::     Relationship::     Contact Information:     Housing/Transportation Living arrangements for the past 2 months:  [place] (Placed in SNF 2 days ago) Source of Information:  Patient, Adult Children, Other (Comment Required) ([place]) Patient Interpreter Needed:  None Criminal Activity/Legal Involvement Pertinent to Current Situation/Hospitalization:  No - Comment as needed Significant Relationships:  Adult Children, Other(Comment) (private caregiver who is with patient most of the time) Lives with:  Self (and has 24 hour, live in caregiver) Do you feel safe going back to the place where you live?  Yes (Caregiver is unsure of whether patient should return to SNF but defers to patient's daughter [name]) Need for family participation in patient care:  Yes (Comment)  Care giving concerns:  DC to SNF on 04/26/15 and readmitted 04/28/15.  "They let him get pneumonia"- per caregiver.   Social Worker assessment / plan:  82 year old male recently d/c'd to SNF by this CSW and has now returned to the hospital. Nathalie met with patient and private caregiver in his room.  He was noted to be relaxed and jovial but seemed somewhat sleepy.  He interacted a little with this CSW and seems to defer mostly to caregiver who stated that the room he was placed in at [facility] was "very cold and his roommate had pneumonia and had to go to the hospital."  Family had specified desire for semi private room at  d/c as he was admitted as an observation patient to the SNF.  Caregiver verbalized concerns about patient's care at the facility and questioned need to locate either another facility or to take him home- however, she stated that patient's daughter [name] would have to decide.  CSW spoke with Dr. Zollie Scale who indicated possible stability on Sunday for patient.  CSW spoke to patient's daughter [name] via telephone who does not live in this area. She was notified of possible weekend d/c and indicated that she was unsure what appropriate d/c plan should be for her father and wants to defer to caregiver (who wants to defer to daughter).  After extensive discussion- daughter and caregiver agree to d/c back to [facility] for further rehab and support and then plan to take patient home.  CSW spoke to [place]- admissions at [facility]- family released his bed yesterday but she will have a bed for patient should he d/c over the weekend.  Fl2 initiated and placed on chart for [name]'s signature.     Employment status:  Retired Forensic scientist:  Medicare PT Recommendations:  [place] / Referral to community resources:  [place]  Patient/Family's Response to care: Patient did not have a consistent response to his current care except to say that he was feeling better. He would then close his eyes and lie quietly in bed. Caregiver states she feels patient is doing better but  is very worried about his "pneumonia" which she feels he "caught" from prior roommate at SNF and that his room was kept so cold.   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  He is able to verbalize a fairly good understanding of his current diagnosis; however, he is very stoic and denies questions about his treatment plan or prognosis.  He declines a desire to discuss his medical  condition.  Emotional Assessment Appearance:  Appears stated age Attitude/Demeanor/Rapport:  Unresponsive (Responsive but very quiet) Affect (typically observed):  Quiet, Calm, Pleasant Orientation:  Oriented to Self, Oriented to Place, Oriented to  Time, Oriented to Situation (documented A & O X's 4 but responses are sometimes inconsistent) Alcohol / Substance use:  Tobacco Use, Alcohol Use (Quit smoking >50 years ago) Psych involvement (Current and /or in the community):  No (Comment)  Discharge Needs  Concerns to be addressed:  Care Coordination (SNF vs Home) Readmission within the last 30 days:  Yes Current discharge risk:  Dependent with Mobility Barriers to Discharge:  Continued Medical Work up   Estill Bakes 04/29/2015, 9:40 PM

## 2015-04-30 DIAGNOSIS — I5032 Chronic diastolic (congestive) heart failure: Secondary | ICD-10-CM

## 2015-04-30 DIAGNOSIS — R0781 Pleurodynia: Secondary | ICD-10-CM

## 2015-04-30 DIAGNOSIS — I248 Other forms of acute ischemic heart disease: Secondary | ICD-10-CM

## 2015-04-30 LAB — TROPONIN I
TROPONIN I: 0.38 ng/mL — AB (ref <0.031)
Troponin I: 0.3 ng/mL — ABNORMAL HIGH (ref <0.031)
Troponin I: 0.3 ng/mL — ABNORMAL HIGH (ref <0.031)

## 2015-04-30 LAB — CBC
HCT: 33 % — ABNORMAL LOW (ref 39.0–52.0)
HEMOGLOBIN: 11.2 g/dL — AB (ref 13.0–17.0)
MCH: 34.3 pg — ABNORMAL HIGH (ref 26.0–34.0)
MCHC: 33.9 g/dL (ref 30.0–36.0)
MCV: 100.9 fL — ABNORMAL HIGH (ref 78.0–100.0)
PLATELETS: 225 10*3/uL (ref 150–400)
RBC: 3.27 MIL/uL — ABNORMAL LOW (ref 4.22–5.81)
RDW: 16.1 % — AB (ref 11.5–15.5)
WBC: 10.4 10*3/uL (ref 4.0–10.5)

## 2015-04-30 LAB — GLUCOSE, CAPILLARY
GLUCOSE-CAPILLARY: 118 mg/dL — AB (ref 65–99)
Glucose-Capillary: 142 mg/dL — ABNORMAL HIGH (ref 65–99)
Glucose-Capillary: 143 mg/dL — ABNORMAL HIGH (ref 65–99)
Glucose-Capillary: 174 mg/dL — ABNORMAL HIGH (ref 65–99)

## 2015-04-30 LAB — BASIC METABOLIC PANEL
ANION GAP: 8 (ref 5–15)
BUN: 75 mg/dL — AB (ref 6–20)
CALCIUM: 9.1 mg/dL (ref 8.9–10.3)
CO2: 30 mmol/L (ref 22–32)
Chloride: 100 mmol/L — ABNORMAL LOW (ref 101–111)
Creatinine, Ser: 2.73 mg/dL — ABNORMAL HIGH (ref 0.61–1.24)
GFR calc Af Amer: 21 mL/min — ABNORMAL LOW (ref >60)
GFR, EST NON AFRICAN AMERICAN: 18 mL/min — AB (ref >60)
GLUCOSE: 152 mg/dL — AB (ref 65–99)
Potassium: 3.4 mmol/L — ABNORMAL LOW (ref 3.5–5.1)
Sodium: 138 mmol/L (ref 135–145)

## 2015-04-30 MED ORDER — POTASSIUM CHLORIDE CRYS ER 20 MEQ PO TBCR
20.0000 meq | EXTENDED_RELEASE_TABLET | Freq: Once | ORAL | Status: AC
  Administered 2015-04-30: 20 meq via ORAL
  Filled 2015-04-30: qty 1

## 2015-04-30 MED ORDER — LEVOFLOXACIN 500 MG PO TABS
500.0000 mg | ORAL_TABLET | ORAL | Status: DC
  Administered 2015-05-02: 500 mg via ORAL
  Filled 2015-04-30: qty 1

## 2015-04-30 MED ORDER — MORPHINE SULFATE (PF) 2 MG/ML IV SOLN
1.0000 mg | Freq: Once | INTRAVENOUS | Status: AC
  Administered 2015-04-30: 1 mg via INTRAVENOUS

## 2015-04-30 MED ORDER — MORPHINE SULFATE (PF) 2 MG/ML IV SOLN
INTRAVENOUS | Status: AC
  Filled 2015-04-30: qty 1

## 2015-04-30 MED ORDER — LEVOFLOXACIN 750 MG PO TABS
750.0000 mg | ORAL_TABLET | Freq: Once | ORAL | Status: AC
  Administered 2015-04-30: 750 mg via ORAL
  Filled 2015-04-30: qty 1

## 2015-04-30 NOTE — Progress Notes (Signed)
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  Family in room.  [name] note reviewed.   Objective:  Vital Signs in the last 24 hours: Temp:  [98.1 F (36.7 C)-98.2 F (36.8 C)] 98.2 F (36.8 C) (12/17 0526) Pulse Rate:  [64-68] 64 (12/17 1043) Resp:  [16-21] 21 (12/17 0526) BP: (119-137)/(52-67) 135/62 mmHg (12/17 1043) SpO2:  [97 %-100 %] 100 % (12/17 0654) Weight:  [178 lb 6.4 oz (80.922 kg)-181 lb 14.1 oz (82.5 kg)] 181 lb 14.1 oz (82.5 kg) (12/17 0440)  Intake/Output from previous day: 12/16 0701 - 12/17 0700 In: -  Out: 1900 [Urine:1900]   Physical Exam: General: Well developed, well nourished, in no acute distress. Head:  Normocephalic and atraumatic. Lungs: Clear to auscultation and percussion. Heart: Normal S1 and S2.  No murmur, rubs or gallops.  Abdomen: soft, non-tender, positive bowel sounds. Extremities: No clubbing or cyanosis. No edema. Neurologic: sleepy at times, difficult to converse.    Lab Results:  Recent Labs  04/29/15 0345 04/30/15 0440  WBC 10.5 10.4  HGB 11.6* 11.2*  PLT 239 225    Recent Labs  04/29/15 0345 04/30/15 0440  NA 134* 138  K 3.9 3.4*  CL 94* 100*  CO2 31 30  GLUCOSE 271* 152*  BUN 95* 75*  CREATININE 3.10* 2.73*    Recent Labs  04/28/15 2335 04/30/15 0650  TROPONINI 0.29* 0.30*   Telemetry: no adverse rhythms Personally viewed.   Cardiac Studies:  ECHO 02/22/15-  35-40%   Scheduled Meds: . allopurinol  100 mg Oral BID  . amiodarone  200 mg Oral QHS  . apixaban  2.5 mg Oral BID  . carboxymethylcellulose  1 drop Both Eyes QHS  . ferrous sulfate  325 mg Oral QODAY  . insulin aspart  0-15 Units Subcutaneous TID WC  . insulin aspart  4 Units Subcutaneous TID WC  . insulin glargine  12 Units Subcutaneous QHS  . isosorbide mononitrate  60 mg Oral Daily  .  levofloxacin  750 mg Oral Once   Followed by  . [START ON 05/02/2015] levofloxacin  500 mg Oral Q48H  . metoprolol tartrate  25 mg Oral Daily  . polyethylene glycol  17 g Oral BID  . potassium chloride  20 mEq Oral Once  . sodium chloride  3 mL Intravenous Q12H  . Tafluprost  1 drop Both Eyes QHS  . tamsulosin  0.4 mg Oral QPC supper   Continuous Infusions:  PRN Meds:.acetaminophen **OR** acetaminophen, Carboxymethylcellul-Glycerin, HYDROcodone-acetaminophen, methocarbamol, nitroGLYCERIN, ondansetron **OR** ondansetron (ZOFRAN) IV   Assessment/Plan:  Principal Problem:   HCAP (healthcare-associated pneumonia) Active Problems:   Paroxysmal atrial fibrillation, DCCV 06/16/13   Pacemaker - St Jude   CKD (chronic kidney disease) stage 4, GFR 15-29 ml/min (HCC)   Chronic diastolic heart failure (HCC)   Low back pain   Chest pain   Elevated troponin   Pressure ulcer  HCAP  - abx per primary team  - possibly sharp CP related to PNA  Chronic combined HF  - since he is not eating or drinking well, hold on torsemide  - EF 35%  Demand ischemia  - mildly elevated troponin  - chronically elevated  CKD 4  - creat mildly improved  PAF  - amio  - anticoag (dose adjusted)  Kristine Chahal 04/30/2015, 12:23 PM

## 2015-04-30 NOTE — Progress Notes (Signed)
   If eating and taking in liquids, I would recommend resuming diuretic regimen, Torsemide, with metolazone as needed.

## 2015-04-30 NOTE — Progress Notes (Signed)
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 Patient has a history of pacemaker placement, paroxysmal atrial fibrillation, chronic systolic and diastolic CHF, type 2 diabetes. He was found to have mildly elevated troponin. X-ray suggested pneumonia. Patient was hospitalized for further management.  Past medical history:  Past Medical History  Diagnosis Date  . Hyperlipidemia   . Spinal stenosis   . Anemia   . Carotid artery disease ([place])     a. s/p prior carotid endarterectomy.  [name] PVD (peripheral vascular disease) ([place])   . Hyponatremia     Chronic  . CVA (cerebral infarction)     a. When asked to clarify patient says he was told thumb numbness may be TIA. He does not recall formal stroke. CT head results are only available through [facility], not in [facility].  . CKD (chronic kidney disease) stage 4, GFR 15-29 ml/min (HCC)   . Long term (current) use of anticoagulants   . PAF (paroxysmal atrial fibrillation) ([place])     a. Asymptomatic. On Eliquis. CHADSVASC 7.  . Sick sinus syndrome ([place])     With DDD St. Jude PM, initially placed in 1993 by Dr. Nils Pyle. Device upgrade 10/2002 to DDD, by Dr. Rollene Fare complicated by bleeding. Subsequent gen change 2011.  [name] Hypertensive cardiovascular disease   . Chronic diastolic congestive heart failure ([place])     ECHO 05/20/12 LVEF estimated by 2D at 55-60%  . Arthritis   . Gait disorder   . Diabetic peripheral neuropathy associated with type 2 diabetes mellitus ([place])   . Diabetes mellitus (HCC)     insulin dependent  . Elevated troponin I level 06/22/2014    a. 06/2014 felt due to demand ischemia.  . Chronic anticoagulation     For PAF, on Eliquis   . Urinary retention   . Pulmonary hypertension ([place])   . Foot drop, left 07/21/2014   . Asterixis 07/21/2014  . Hypertension   . Presence of permanent cardiac pacemaker   . Bilateral plantar fasciitis   . Pneumonia     hx of pneumonia x 1   . HCAP (healthcare-associated pneumonia) 04/28/2015    Consultants: Cardiology  Procedures: None  Antibiotics: Cefepime and vancomycin will be changed over to Levaquin today  Subjective: Patient somewhat more awake and alert today. No complaints offered.  Objective: Vital Signs  Filed Vitals:   04/30/15 0624 04/30/15 0636 04/30/15 0654 04/30/15 1043  BP: 128/56 119/57 137/66 135/62  Pulse: 64  68 64  Temp:      TempSrc:      Resp:      Height:      Weight:      SpO2: 100%  100%     Intake/Output Summary (Last 24 hours) at 04/30/15 1048 Last data filed at 04/30/15 0558  Gross per 24 hour  Intake      0 ml  Output    700 ml  Net   -700 ml   Filed Weights   04/29/15 1600 04/30/15 0440  Weight: 80.922 kg (178 lb 6.4 oz) 82.5 kg (181 lb 14.1 oz)    General appearance: alert, distracted and no distress Resp: Diminished air entry at the bases without any wheezing or rhonchi.  Cardio: regular rate and rhythm, S1, S2 normal, no murmur, click, rub or gallop GI:  soft, non-tender; bowel sounds normal; no masses,  no organomegaly Neurologic: Awake and alert. No obvious focal neurological deficits noted.  Lab Results:  Basic Metabolic Panel:  Recent Labs Lab 04/25/15 0430 04/26/15 0420 04/28/15 0323 04/28/15 0437 04/29/15 0345 04/30/15 0440  NA 130* 130* 129* 132* 134* 138  K 3.9 3.9 4.2 4.4 3.9 3.4*  CL 87* 87* 88* 90* 94* 100*  CO2 30 32 30  --  31 30  GLUCOSE 166* 263* 212* 206* 271* 152*  BUN 94* 101* 110* 101* 95* 75*  CREATININE 3.62* 3.61* 3.53* 3.50* 3.10* 2.73*  CALCIUM 9.5 9.5 9.7  --  9.1 9.1   CBC:  Recent Labs Lab 04/28/15 0323 04/28/15 0437 04/28/15 1234 04/29/15 0345 04/30/15 0440  WBC 13.7*  --  11.7* 10.5 10.4  NEUTROABS 11.7*  --  9.8*  --   --   HGB 12.2* 12.2* 11.3* 11.6*  11.2*  HCT 36.2* 36.0* 33.6* 34.7* 33.0*  MCV 99.2  --  99.1 100.9* 100.9*  PLT 239  --  215 239 225   Cardiac Enzymes:  Recent Labs Lab 04/28/15 0321 04/28/15 1234 04/28/15 1833 04/28/15 2335 04/30/15 0650  TROPONINI 0.39* 0.30* 0.33* 0.29* 0.30*   BNP (last 3 results)  Recent Labs  10/04/14 1241 02/27/15 0924 04/28/15 0324  BNP 467.9* 325.4* 470.1*     CBG:  Recent Labs Lab 04/29/15 0627 04/29/15 1153 04/29/15 1624 04/29/15 2144 04/30/15 0620  GLUCAP 247* 322* 227* 128* 142*    Recent Results (from the past 240 hour(s))  Blood culture (routine x 2)     Status: None (Preliminary result)   Collection Time: 04/28/15  4:25 AM  Result Value Ref Range Status   Specimen Description BLOOD RIGHT HAND  Final   Special Requests AEROBIC BOTTLE ONLY 5ML  Final   Culture NO GROWTH 1 DAY  Final   Report Status PENDING  Incomplete  Blood culture (routine x 2)     Status: None (Preliminary result)   Collection Time: 04/28/15  4:27 AM  Result Value Ref Range Status   Specimen Description BLOOD LEFT HAND  Final   Special Requests BOTTLES DRAWN AEROBIC AND ANAEROBIC 5ML  Final   Culture NO GROWTH 1 DAY  Final   Report Status PENDING  Incomplete  Urine culture     Status: None   Collection Time: 04/28/15  6:11 AM  Result Value Ref Range Status   Specimen Description URINE, RANDOM  Final   Special Requests NONE  Final   Culture NO GROWTH 1 DAY  Final   Report Status 04/29/2015 FINAL  Final      Studies/Results: No results found.  Medications:  Scheduled: . allopurinol  100 mg Oral BID  . amiodarone  200 mg Oral QHS  . apixaban  2.5 mg Oral BID  . carboxymethylcellulose  1 drop Both Eyes QHS  . ceFEPime (MAXIPIME) IV  1 g Intravenous Q24H  . ferrous sulfate  325 mg Oral QODAY  . insulin aspart  0-15 Units Subcutaneous TID WC  . insulin aspart  4 Units Subcutaneous TID WC  . insulin glargine  12 Units Subcutaneous QHS  . isosorbide mononitrate  60 mg Oral Daily   . metoprolol tartrate  25 mg Oral Daily  . polyethylene glycol  17 g Oral BID  . sodium chloride  3 mL Intravenous Q12H  . Tafluprost  1 drop Both Eyes QHS  . tamsulosin  0.4 mg Oral QPC supper  . vancomycin  1,000 mg Intravenous Q48H   Continuous:   [id number] **OR** acetaminophen, Carboxymethylcellul-Glycerin, HYDROcodone-acetaminophen, methocarbamol, nitroGLYCERIN, ondansetron **OR** ondansetron (ZOFRAN) IV  Assessment/Plan:  Principal Problem:   HCAP (healthcare-associated pneumonia) Active Problems:   Paroxysmal atrial fibrillation, DCCV 06/16/13   Pacemaker - St Jude   CKD (chronic kidney disease) stage 4, GFR 15-29 ml/min (HCC)   Chronic diastolic heart failure (HCC)   Low back pain   Chest pain   Elevated troponin   Pressure ulcer    Healthcare associated pneumonia Cultures are negative so far. Changed to oral Levaquin. Continue to monitor closely.   Chest pain with mildly elevated troponin Chest pain overnight. EKG did not show anything concerning. Troponin remains minimally elevated. Does not appear to be cardiac. Cardiology is following. Continue medical management for now. Do not anticipate invasive testing.  Low back pain Appears to be chronic. Recently seen by neurosurgery and was placed on steroids. Continue narcotics as needed.  Chronic systolic and diastolic congestive heart failure Last measured EF was about 35-40%. Thought to be volume depleted by cardiology and given IV fluids. His diuretics were held. Defer to them.   Chronic kidney disease stage III Creatinine has improved with hydration. Continue to monitor urine output.  History of sick sinus syndrome, status post pacemaker Stable.  History of paroxysmal atrial fibrillation Stable. Heart rate is reasonably well controlled. Continue amiodarone, metoprolol and anticoagulation with Apixaban.  Chronic anemia Stable. Continue to monitor.  History of gout Allopurinol  Diabetes mellitus  type 2 with chronic kidney disease Continue Lantus with sliding scale coverage.  History of obstructive uropathy with Foley catheter According to patient's caregiver, patient was seen recently by urology. They want him to keep the Foley catheter in until follow-up next week. Continue Flomax.  He is exhibiting some signs of failure to thrive as well. His oral intake remains poor.   DVT Prophylaxis: On anticoagulation    Code Status: Full code.   Family Communication: Caregiver is at bedside. Disposition Plan: Continue current treatment as outlined. Will likely return to SNF when improved.    LOS: 2 days   [place] [facility] Pager [phone] 04/30/2015, 10:48 AM  If 7PM-7AM, please contact night-coverage at [email or web address], password [facility]

## 2015-04-30 NOTE — Progress Notes (Signed)
Pt complained of chest pain 10 out of 10. Oxygen was applied, ekg was taken, and one nitro was given. During the event, pt would stop breathing briefly. Rapid and M. Donnal Debar was called to look at pt. VSS were stable and orders were placed. Morphine was given and pt stated pain was less than 10. Pt is resting and report was given to day shift nurse.

## 2015-04-30 NOTE — Progress Notes (Signed)
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  The patient is currently on day 3 of antibiotics for HCAP. Pharmacy consulted to transition to [place].  Assessment/Plan: - D/c vancomycin and cefepime - Levaquin 750 mg PO once then 500 mg PO q48h for 2 doses (to complete 8 days of treatment) - Pharmacy signing off, please re-consult if needed  Temp (24hrs), Avg:98.2 F (36.8 C), Min:98.1 F (36.7 C), Max:98.2 F (36.8 C)   Recent Labs Lab 04/28/15 0323 04/28/15 1234 04/29/15 0345 04/30/15 0440  WBC 13.7* 11.7* 10.5 10.4    Recent Labs Lab 04/26/15 0420 04/28/15 0323 04/28/15 0437 04/29/15 0345 04/30/15 0440  CREATININE 3.61* 3.53* 3.50* 3.10* 2.73*   Estimated Creatinine Clearance: 17 mL/min (by C-G formula based on Cr of 2.73).    Allergies  Allergen Reactions  . Statins Other (See Comments)    rhabdomyolisis  . Aggrenox [Aspirin-Dipyridamole Er] Other (See Comments)    Dizziness  . Carvedilol Other (See Comments)    Dizziness  . Claritin [Loratadine] Other (See Comments)    Dizziness & drowsiness   . Cymbalta [Duloxetine Hcl]     Confusion   . Latanoprost Itching    Watery   . Lyrica [Pregabalin] Other (See Comments)    Makes him dizzy. Takes him days to recover.  . Mucinex [Guaifenesin Er] Other (See Comments)    Dizziness, drowsiness  . Other Other (See Comments)    Preservatives in glaucoma gtts cause eye irritation, has to use preservative free  . Phenergan [Promethazine Hcl] Other (See Comments)    Causes severe confusion  . Plavix [Clopidogrel Bisulfate] Other (See Comments)    Dizziness  . Rapaflo [Silodosin] Other (See Comments)    Dizziness for about 30- 45  Minutes At time of preop appointment on 08/26/2014 patient denies any problems with this medication  . Travatan Z [Travoprost] Other (See Comments)    Eye irritation    Antimicrobials this admission: cefepime 12/15 >>  12/16 Zosyn 12/15 x 1 dose Vancomycin 12/15 >> 12/17  Levels/dose changes this admission: none  Microbiology results: 12/15 BCx: ngtd 12/15 UCx: neg    Thank you for allowing pharmacy to be a part of this patient's care.  [place], Pharm.D., BCPS Clinical Pharmacist Pager: [phone] 04/30/2015 11:33 AM

## 2015-05-01 DIAGNOSIS — R627 Adult failure to thrive: Secondary | ICD-10-CM | POA: Insufficient documentation

## 2015-05-01 DIAGNOSIS — N184 Chronic kidney disease, stage 4 (severe): Secondary | ICD-10-CM

## 2015-05-01 DIAGNOSIS — Z950 Presence of cardiac pacemaker: Secondary | ICD-10-CM

## 2015-05-01 LAB — BASIC METABOLIC PANEL WITH GFR
Anion gap: 9 (ref 5–15)
BUN: 62 mg/dL — ABNORMAL HIGH (ref 6–20)
CO2: 30 mmol/L (ref 22–32)
Calcium: 9.2 mg/dL (ref 8.9–10.3)
Chloride: 97 mmol/L — ABNORMAL LOW (ref 101–111)
Creatinine, Ser: 2.49 mg/dL — ABNORMAL HIGH (ref 0.61–1.24)
GFR calc Af Amer: 24 mL/min — ABNORMAL LOW
GFR calc non Af Amer: 21 mL/min — ABNORMAL LOW
Glucose, Bld: 93 mg/dL (ref 65–99)
Potassium: 3.6 mmol/L (ref 3.5–5.1)
Sodium: 136 mmol/L (ref 135–145)

## 2015-05-01 LAB — GLUCOSE, CAPILLARY
GLUCOSE-CAPILLARY: 134 mg/dL — AB (ref 65–99)
GLUCOSE-CAPILLARY: 146 mg/dL — AB (ref 65–99)
Glucose-Capillary: 80 mg/dL (ref 65–99)
Glucose-Capillary: 167 mg/dL — ABNORMAL HIGH (ref 65–99)

## 2015-05-01 MED ORDER — MEGESTROL ACETATE 400 MG/10ML PO SUSP
200.0000 mg | Freq: Every day | ORAL | Status: DC
  Administered 2015-05-01 – 2015-05-02 (×2): 200 mg via ORAL
  Filled 2015-05-01 (×2): qty 5

## 2015-05-01 MED ORDER — TORSEMIDE 20 MG PO TABS
80.0000 mg | ORAL_TABLET | Freq: Every day | ORAL | Status: DC
  Administered 2015-05-01 – 2015-05-02 (×2): 80 mg via ORAL
  Filled 2015-05-01 (×3): qty 4

## 2015-05-01 NOTE — Progress Notes (Signed)
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 Patient has a history of pacemaker placement, paroxysmal atrial fibrillation, chronic systolic and diastolic CHF, type 2 diabetes. He was found to have mildly elevated troponin. X-ray suggested pneumonia. Patient was hospitalized for further management.  Past medical history:  Past Medical History  Diagnosis Date  . Hyperlipidemia   . Spinal stenosis   . Anemia   . Carotid artery disease ([place])     a. s/p prior carotid endarterectomy.  [name] PVD (peripheral vascular disease) ([name])   . Hyponatremia     Chronic  . CVA (cerebral infarction)     a. When asked to clarify patient says he was told thumb numbness may be TIA. He does not recall formal stroke. CT head results are only available through [facility], not in [facility].  . CKD (chronic kidney disease) stage 4, GFR 15-29 ml/min (HCC)   . Long term (current) use of anticoagulants   . PAF (paroxysmal atrial fibrillation) ([name])     a. Asymptomatic. On Eliquis. CHADSVASC 7.  . Sick sinus syndrome ([place])     With DDD St. Jude PM, initially placed in 1993 by Dr. Nils Pyle. Device upgrade 10/2002 to DDD, by Dr. Rollene Fare complicated by bleeding. Subsequent gen change 2011.  [name] Hypertensive cardiovascular disease   . Chronic diastolic congestive heart failure ([name])     ECHO 05/20/12 LVEF estimated by 2D at 55-60%  . Arthritis   . Gait disorder   . Diabetic peripheral neuropathy associated with type 2 diabetes mellitus ([place])   . Diabetes mellitus (HCC)     insulin dependent  . Elevated troponin I level 06/22/2014    a. 06/2014 felt due to demand ischemia.  . Chronic anticoagulation     For PAF, on Eliquis   . Urinary retention   . Pulmonary hypertension ([name])   . Foot drop, left 07/21/2014   . Asterixis 07/21/2014  . Hypertension   . Presence of permanent cardiac pacemaker   . Bilateral plantar fasciitis   . Pneumonia     hx of pneumonia x 1   . HCAP (healthcare-associated pneumonia) 04/28/2015    Consultants: Cardiology  Procedures: None  Antibiotics: Cefepime and vancomycin changed over to Levaquin 12/17  Subjective: Patient seems much more awake and alert today. Denies any pain. Continues to have poor appetite.   Objective: Vital Signs  Filed Vitals:   04/30/15 2006 04/30/15 2123 05/01/15 0536 05/01/15 0935  BP: 129/59 131/61 133/65 120/52  Pulse: 60 62 64 64  Temp: 97.6 F (36.4 C) 98.9 F (37.2 C) 98 F (36.7 C)   TempSrc: Oral  Oral   Resp: 18  18   Height:      Weight:   85.8 kg (189 lb 2.5 oz)   SpO2: 100% 100% 100%     Intake/Output Summary (Last 24 hours) at 05/01/15 1108 Last data filed at 05/01/15 0900  Gross per 24 hour  Intake      0 ml  Output   1550 ml  Net  -1550 ml   Filed Weights   04/29/15 1600 04/30/15 0440 05/01/15 0536  Weight: 80.922 kg (178 lb 6.4 oz) 82.5 kg (181 lb 14.1 oz) 85.8 kg (189 lb 2.5 oz)    General appearance: alert, distracted and no distress Resp: Diminished air  entry at the bases without any wheezing or rhonchi.  Cardio: regular rate and rhythm, S1, S2 normal, no murmur, click, rub or gallop GI: soft, non-tender; bowel sounds normal; no masses,  no organomegaly Neurologic: Awake and alert. No obvious focal neurological deficits noted.  Lab Results:  Basic Metabolic Panel:  Recent Labs Lab 04/26/15 0420 04/28/15 0323 04/28/15 0437 04/29/15 0345 04/30/15 0440 05/01/15 0245  NA 130* 129* 132* 134* 138 136  K 3.9 4.2 4.4 3.9 3.4* 3.6  CL 87* 88* 90* 94* 100* 97*  CO2 32 30  --  31 30 30   GLUCOSE 263* 212* 206* 271* 152* 93  BUN 101* 110* 101* 95* 75* 62*  CREATININE 3.61* 3.53* 3.50* 3.10* 2.73* 2.49*  CALCIUM 9.5 9.7  --  9.1 9.1 9.2   CBC:  Recent Labs Lab 04/28/15 0323 04/28/15 0437  04/28/15 1234 04/29/15 0345 04/30/15 0440  WBC 13.7*  --  11.7* 10.5 10.4  NEUTROABS 11.7*  --  9.8*  --   --   HGB 12.2* 12.2* 11.3* 11.6* 11.2*  HCT 36.2* 36.0* 33.6* 34.7* 33.0*  MCV 99.2  --  99.1 100.9* 100.9*  PLT 239  --  215 239 225   Cardiac Enzymes:  Recent Labs Lab 04/28/15 1833 04/28/15 2335 04/30/15 0650 04/30/15 1155 04/30/15 1900  TROPONINI 0.33* 0.29* 0.30* 0.30* 0.38*   BNP (last 3 results)  Recent Labs  10/04/14 1241 02/27/15 0924 04/28/15 0324  BNP 467.9* 325.4* 470.1*     CBG:  Recent Labs Lab 04/30/15 0620 04/30/15 1116 04/30/15 1625 04/30/15 2051 05/01/15 0614  GLUCAP 142* 143* 174* 118* 80    Recent Results (from the past 240 hour(s))  Blood culture (routine x 2)     Status: None (Preliminary result)   Collection Time: 04/28/15  4:25 AM  Result Value Ref Range Status   Specimen Description BLOOD RIGHT HAND  Final   Special Requests AEROBIC BOTTLE ONLY 5ML  Final   Culture NO GROWTH 3 DAYS  Final   Report Status PENDING  Incomplete  Blood culture (routine x 2)     Status: None (Preliminary result)   Collection Time: 04/28/15  4:27 AM  Result Value Ref Range Status   Specimen Description BLOOD LEFT HAND  Final   Special Requests BOTTLES DRAWN AEROBIC AND ANAEROBIC 5ML  Final   Culture NO GROWTH 3 DAYS  Final   Report Status PENDING  Incomplete  Urine culture     Status: None   Collection Time: 04/28/15  6:11 AM  Result Value Ref Range Status   Specimen Description URINE, RANDOM  Final   Special Requests NONE  Final   Culture NO GROWTH 1 DAY  Final   Report Status 04/29/2015 FINAL  Final      Studies/Results: No results found.  Medications:  Scheduled: . allopurinol  100 mg Oral BID  . amiodarone  200 mg Oral QHS  . apixaban  2.5 mg Oral BID  . carboxymethylcellulose  1 drop Both Eyes QHS  . ferrous sulfate  325 mg Oral QODAY  . insulin aspart  0-15 Units Subcutaneous TID WC  . insulin aspart  4 Units Subcutaneous  TID WC  . insulin glargine  12 Units Subcutaneous QHS  . isosorbide mononitrate  60 mg Oral Daily  . [START ON 05/02/2015] levofloxacin  500 mg Oral Q48H  . megestrol  200 mg Oral Daily  . metoprolol tartrate  25 mg Oral Daily  . polyethylene glycol  17  g Oral BID  . sodium chloride  3 mL Intravenous Q12H  . Tafluprost  1 drop Both Eyes QHS  . tamsulosin  0.4 mg Oral QPC supper   Continuous:   [id number] **OR** acetaminophen, Carboxymethylcellul-Glycerin, HYDROcodone-acetaminophen, methocarbamol, nitroGLYCERIN, ondansetron **OR** ondansetron (ZOFRAN) IV  Assessment/Plan:  Principal Problem:   HCAP (healthcare-associated pneumonia) Active Problems:   Paroxysmal atrial fibrillation, DCCV 06/16/13   Pacemaker - St Jude   CKD (chronic kidney disease) stage 4, GFR 15-29 ml/min (HCC)   Chronic diastolic heart failure (HCC)   Low back pain   Chest pain   Elevated troponin   Pressure ulcer    Healthcare associated pneumonia Cultures are negative so far. Caution was changed over to oral Levaquin 12/17. He seems to be stable.   Chest pain with mildly elevated troponin No further chest pain. EKG did not show anything concerning. Troponin remains minimally elevated. Does not appear to be cardiac. Cardiology is following. Continue medical management for now. Do not anticipate invasive testing.  Low back pain Appears to be chronic. Recently seen by neurosurgery and was placed on steroids. Continue narcotics as needed.  Chronic systolic and diastolic congestive heart failure Last measured EF was about 35-40%. Thought to be volume depleted by cardiology and given IV fluids. His diuretics were held. Defer to them.   Chronic kidney disease stage III Creatinine has improved with hydration. Continue to monitor urine output.  History of sick sinus syndrome, status post pacemaker Stable.  History of paroxysmal atrial fibrillation Stable. Heart rate is reasonably well controlled.  Continue amiodarone, metoprolol and anticoagulation with Apixaban.  Chronic anemia Stable. Continue to monitor.  History of gout Allopurinol  Diabetes mellitus type 2 with chronic kidney disease Continue Lantus with sliding scale coverage. CBGs are well controlled.  History of obstructive uropathy with Foley catheter According to patient's caregiver, patient was seen recently by urology. They want him to keep the Foley catheter in until follow-up next week. Continue Flomax.  He is exhibiting some signs of failure to thrive as well. His oral intake remains poor. He'll be given a trial of Megace.   DVT Prophylaxis: On anticoagulation    Code Status: Full code.   Family Communication: Caregiver is at bedside. Disposition Plan: Continue current treatment as outlined. Anticipate discharge to skilled nursing facility tomorrow if cleared by cardiology.    LOS: 3 days   [place] [facility] Pager [phone] 05/01/2015, 11:08 AM  If 7PM-7AM, please contact night-coverage at [email or web address], password [facility]

## 2015-05-01 NOTE — Progress Notes (Signed)
    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   Dr. Thompson Caul note reviewed.   Objective:  Vital Signs in the last 24 hours: Temp:  [97.6 F (36.4 C)-98.9 F (37.2 C)] 98 F (36.7 C) (12/18 0536) Pulse Rate:  [60-64] 64 (12/18 0935) Resp:  [18] 18 (12/18 0536) BP: (120-133)/(51-65) 120/52 mmHg (12/18 0935) SpO2:  [100 %] 100 % (12/18 0536) Weight:  [189 lb 2.5 oz (85.8 kg)] 189 lb 2.5 oz (85.8 kg) (12/18 0536)  Intake/Output from previous day: 12/17 0701 - 12/18 0700 In: -  Out: 750 [Urine:750]   Physical Exam: General: Well developed, well nourished, elderly in no acute distress. Head:  Normocephalic and atraumatic. Lungs: Clear to auscultation and percussion. Heart: Normal S1 and S2.  No murmur, rubs or gallops.  Abdomen: soft, non-tender, positive bowel sounds. Extremities: No clubbing or cyanosis. No edema. Neurologic: Seems more alert today.    Lab Results:  Recent Labs  04/29/15 0345 04/30/15 0440  WBC 10.5 10.4  HGB 11.6* 11.2*  PLT 239 225    Recent Labs  04/30/15 0440 05/01/15 0245  NA 138 136  K 3.4* 3.6  CL 100* 97*  CO2 30 30  GLUCOSE 152* 93  BUN 75* 62*  CREATININE 2.73* 2.49*    Recent Labs  04/30/15 1155 04/30/15 1900  TROPONINI 0.30* 0.38*   Telemetry: no adverse rhythms, paced Personally viewed.   Cardiac Studies:  ECHO 02/22/15-  35-40%   Scheduled Meds: . allopurinol  100 mg Oral BID  . amiodarone  200 mg Oral QHS  . apixaban  2.5 mg Oral BID  . carboxymethylcellulose  1 drop Both Eyes QHS  . ferrous sulfate  325 mg Oral QODAY  . insulin aspart  0-15 Units Subcutaneous TID WC  . insulin aspart  4 Units Subcutaneous TID WC  . insulin glargine  12 Units Subcutaneous QHS  . isosorbide mononitrate  60 mg Oral Daily  . [START ON 05/02/2015] levofloxacin  500 mg Oral Q48H  .  megestrol  200 mg Oral Daily  . metoprolol tartrate  25 mg Oral Daily  . polyethylene glycol  17 g Oral BID  . sodium chloride  3 mL Intravenous Q12H  . Tafluprost  1 drop Both Eyes QHS  . tamsulosin  0.4 mg Oral QPC supper   Continuous Infusions:  PRN Meds:.acetaminophen **OR** acetaminophen, Carboxymethylcellul-Glycerin, HYDROcodone-acetaminophen, methocarbamol, nitroGLYCERIN, ondansetron **OR** ondansetron (ZOFRAN) IV   Assessment/Plan:  Principal Problem:   HCAP (healthcare-associated pneumonia) Active Problems:   Paroxysmal atrial fibrillation, DCCV 06/16/13   Pacemaker - St Jude   CKD (chronic kidney disease) stage 4, GFR 15-29 ml/min (HCC)   Chronic diastolic heart failure (HCC)   Low back pain   Chest pain   Elevated troponin   Pressure ulcer  HCAP  - abx per primary team  - possibly sharp CP related to PNA  - Weakness-encourage physical therapy  Chronic combined HF  - Restart torsemide. He is now eating and drinking. Weight is increased  - EF 35%  Demand ischemia  - mildly elevated troponin  - chronically elevated  CKD 4  - creat mildly improved  PAF  - amio  - anticoag (dose adjusted)  Andriea Hasegawa 05/01/2015, 11:59 AM

## 2015-05-01 NOTE — Care Management Important Message (Signed)
Important Message  Patient Details  Name: Darrell BRAFF, MD MRN: [id number] Date of Birth: [birth year]   Medicare Important Message Given:  Yes    Apolonio Schneiders, RN 05/01/2015, 11:19 AM

## 2015-05-02 ENCOUNTER — Ambulatory Visit: Payer: Medicare Other | Admitting: Interventional Cardiology

## 2015-05-02 LAB — BASIC METABOLIC PANEL
Anion gap: 11 (ref 5–15)
BUN: 58 mg/dL — AB (ref 6–20)
CHLORIDE: 95 mmol/L — AB (ref 101–111)
CO2: 28 mmol/L (ref 22–32)
CREATININE: 2.48 mg/dL — AB (ref 0.61–1.24)
Calcium: 8.9 mg/dL (ref 8.9–10.3)
GFR calc Af Amer: 24 mL/min — ABNORMAL LOW (ref >60)
GFR calc non Af Amer: 21 mL/min — ABNORMAL LOW (ref >60)
GLUCOSE: 116 mg/dL — AB (ref 65–99)
Potassium: 3.9 mmol/L (ref 3.5–5.1)
SODIUM: 134 mmol/L — AB (ref 135–145)

## 2015-05-02 LAB — GLUCOSE, CAPILLARY
GLUCOSE-CAPILLARY: 135 mg/dL — AB (ref 65–99)
Glucose-Capillary: 130 mg/dL — ABNORMAL HIGH (ref 65–99)
Glucose-Capillary: 98 mg/dL (ref 65–99)

## 2015-05-02 MED ORDER — LEVOFLOXACIN 500 MG PO TABS: 500.0000 mg | ORAL_TABLET | ORAL | Status: DC

## 2015-05-02 MED ORDER — TORSEMIDE 20 MG PO TABS: 60.0000 mg | ORAL_TABLET | Freq: Every day | ORAL | Status: DC

## 2015-05-02 MED ORDER — TORSEMIDE 20 MG PO TABS: 60.0000 mg | ORAL_TABLET | Freq: Every day | ORAL | Status: AC

## 2015-05-02 MED ORDER — INSULIN GLARGINE 100 UNIT/ML ~~LOC~~ SOLN: 12.0000 [IU] | Freq: Every day | SUBCUTANEOUS | Status: AC

## 2015-05-02 MED ORDER — MEGESTROL ACETATE 400 MG/10ML PO SUSP: 200.0000 mg | Freq: Every day | ORAL | Status: AC

## 2015-05-02 MED ORDER — HYDROCODONE-ACETAMINOPHEN 5-325 MG PO TABS: 1.0000 | ORAL_TABLET | ORAL | Status: AC | PRN

## 2015-05-02 NOTE — Progress Notes (Signed)
   Oral intake is better but not great.  Dry mucous membranes and negative I/O  Will decrease torsemide to 60 mg daily.

## 2015-05-02 NOTE — Discharge Instructions (Signed)
Pneumonia, Adult Pneumonia is an infection of the lungs. There are different types of pneumonia. One type can develop while a person is in a hospital. A different type, called community-acquired pneumonia, develops in people who are not, or have not recently been, in the hospital or other health care facility.  CAUSES Pneumonia may be caused by bacteria, viruses, or funguses. Community-acquired pneumonia is often caused by Streptococcus pneumonia bacteria. These bacteria are often passed from one person to another by breathing in droplets from the cough or sneeze of an infected person. RISK FACTORS The condition is more likely to develop in:  People who havechronic diseases, such as chronic obstructive pulmonary disease (COPD), asthma, congestive heart failure, cystic fibrosis, diabetes, or kidney disease.  People who haveearly-stage or late-stage HIV.  People who havesickle cell disease.  People who havehad their spleen removed (splenectomy).  People who havepoor Human resources officer.  People who havemedical conditions that increase the risk of breathing in (aspirating) secretions their own mouth and nose.   People who havea weakened immune system (immunocompromised).  People who smoke.  People whotravel to areas where pneumonia-causing germs commonly exist.  People whoare around animal habitats or animals that have pneumonia-causing germs, including birds, bats, rabbits, cats, and farm animals. SYMPTOMS Symptoms of this condition include:  Adry cough.  A wet (productive) cough.  Fever.  Sweating.  Chest pain, especially when breathing deeply or coughing.  Rapid breathing or difficulty breathing.  Shortness of breath.  Shaking chills.  Fatigue.  Muscle aches. DIAGNOSIS Your health care provider will take a medical history and perform a physical exam. You may also have other tests, including:  Imaging studies of your chest, including X-rays.  Tests to check  your blood oxygen level and other blood gases.  Other tests on blood, mucus (sputum), fluid around your lungs (pleural fluid), and urine. If your pneumonia is severe, other tests may be done to identify the specific cause of your illness. TREATMENT The type of treatment that you receive depends on many factors, such as the cause of your pneumonia, the medicines you take, and other medical conditions that you have. For most adults, treatment and recovery from pneumonia may occur at home. In some cases, treatment must happen in a hospital. Treatment may include:  Antibiotic medicines, if the pneumonia was caused by bacteria.  Antiviral medicines, if the pneumonia was caused by a virus.  Medicines that are given by mouth or through an IV tube.  Oxygen.  Respiratory therapy. Although rare, treating severe pneumonia may include:  Mechanical ventilation. This is done if you are not breathing well on your own and you cannot maintain a safe blood oxygen level.  Thoracentesis. This procedureremoves fluid around one lung or both lungs to help you breathe better. HOME CARE INSTRUCTIONS  Take over-the-counter and prescription medicines only as told by your health care provider.  Only takecough medicine if you are losing sleep. Understand that cough medicine can prevent your body's natural ability to remove mucus from your lungs.  If you were prescribed an antibiotic medicine, take it as told by your health care provider. Do not stop taking the antibiotic even if you start to feel better.  Sleep in a semi-upright position at night. Try sleeping in a reclining chair, or place a few pillows under your head.  Do not use tobacco products, including cigarettes, chewing tobacco, and e-cigarettes. If you need help quitting, ask your health care provider.  Drink enough water to keep your urine clear  or pale yellow. This will help to thin out mucus secretions in your lungs. PREVENTION There are ways  that you can decrease your risk of developing community-acquired pneumonia. Consider getting a pneumococcal vaccine if:  You are older than 79 years of age.  You are older than 79 years of age and are undergoing cancer treatment, have chronic lung disease, or have other medical conditions that affect your immune system. Ask your health care provider if this applies to you. There are different types and schedules of pneumococcal vaccines. Ask your health care provider which vaccination option is best for you. You may also prevent community-acquired pneumonia if you take these actions:  Get an influenza vaccine every year. Ask your health care provider which type of influenza vaccine is best for you.  Go to the dentist on a regular basis.  Wash your hands often. Use hand sanitizer if soap and water are not available. SEEK MEDICAL CARE IF:  You have a fever.  You are losing sleep because you cannot control your cough with cough medicine. SEEK IMMEDIATE MEDICAL CARE IF:  You have worsening shortness of breath.  You have increased chest pain.  Your sickness becomes worse, especially if you are an older adult or have a weakened immune system.  You cough up blood.   This information is not intended to replace advice given to you by your health care provider. Make sure you discuss any questions you have with your health care provider.   Document Released: 04/30/2005 Document Revised: 01/19/2015 Document Reviewed: 08/25/2014 Elsevier Interactive Patient Education [employer].

## 2015-05-02 NOTE — Progress Notes (Signed)
Pt called out and reported feeling SOB. RN assessed lungs which were clear, but diminished. O2 saturation was 98% on room air. Pt in no apparent distress. RN raised HOB. Pt then fell back to sleep.   Will continue to monitor.   Earlie Lou

## 2015-05-02 NOTE — Progress Notes (Signed)
Chaplain presented to the patient to provide pastoral support and prayer of healing and comfort.  The patient shared some of his physical discomforts he was experiencing, but overall he was in good emotional health and well being.  He was appreciative of the Chaplain visit and was open to any other pastoral visitation. Chaplain Yaakov Guthrie [phone]

## 2015-05-02 NOTE — Progress Notes (Signed)
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   Feeling weak but denies any chest pain or dyspnea.   Objective:  Vital Signs in the last 24 hours: Temp:  [97.5 F (36.4 C)-99.2 F (37.3 C)] 97.5 F (36.4 C) (12/19 0433) Pulse Rate:  [64-65] 64 (12/19 0433) Resp:  [17-18] 18 (12/19 0433) BP: (121-134)/(53-67) 121/53 mmHg (12/19 0433) SpO2:  [96 %-99 %] 96 % (12/19 0433) Weight:  [84.9 kg (187 lb 2.7 oz)] 84.9 kg (187 lb 2.7 oz) (12/19 0433)  Intake/Output from previous day: 12/18 0701 - 12/19 0700 In: 720 [P.O.:720] Out: 1625 [Urine:1625]   Physical Exam: General: Well developed, well nourished, elderly in no acute distress. Head:  Normocephalic and atraumatic. Lungs: Clear to auscultation and percussion. Heart: Normal S1 and S2.  No murmur, rubs or gallops.  Abdomen: soft, non-tender, positive bowel sounds. Extremities: No clubbing or cyanosis. No edema. Neurologic: Seems more alert today.    Lab Results:  Recent Labs  04/30/15 0440  WBC 10.4  HGB 11.2*  PLT 225    Recent Labs  05/01/15 0245 05/02/15 0539  NA 136 134*  K 3.6 3.9  CL 97* 95*  CO2 30 28  GLUCOSE 93 116*  BUN 62* 58*  CREATININE 2.49* 2.48*    Recent Labs  04/30/15 1155 04/30/15 1900  TROPONINI 0.30* 0.38*   Telemetry: no adverse rhythms, paced Personally viewed.   Cardiac Studies:  ECHO 02/22/15-  35-40%   Scheduled Meds: . allopurinol  100 mg Oral BID  . amiodarone  200 mg Oral QHS  . apixaban  2.5 mg Oral BID  . carboxymethylcellulose  1 drop Both Eyes QHS  . ferrous sulfate  325 mg Oral QODAY  . insulin aspart  0-15 Units Subcutaneous TID WC  . insulin aspart  4 Units Subcutaneous TID WC  . insulin glargine  12 Units Subcutaneous QHS  . isosorbide mononitrate  60 mg Oral Daily  . levofloxacin  500 mg Oral Q48H  . megestrol  200 mg Oral Daily  . metoprolol  tartrate  25 mg Oral Daily  . polyethylene glycol  17 g Oral BID  . sodium chloride  3 mL Intravenous Q12H  . Tafluprost  1 drop Both Eyes QHS  . tamsulosin  0.4 mg Oral QPC supper  . torsemide  80 mg Oral Daily   Continuous Infusions:  PRN Meds:.acetaminophen **OR** acetaminophen, Carboxymethylcellul-Glycerin, HYDROcodone-acetaminophen, methocarbamol, nitroGLYCERIN, ondansetron **OR** ondansetron (ZOFRAN) IV   Assessment/Plan:  Principal Problem:   HCAP (healthcare-associated pneumonia) Active Problems:   Paroxysmal atrial fibrillation, DCCV 06/16/13   Pacemaker - St Jude   CKD (chronic kidney disease) stage 4, GFR 15-29 ml/min (HCC)   Chronic diastolic heart failure (HCC)   Low back pain   Chest pain   Elevated troponin   Pressure ulcer   Failure to thrive in adult  HCAP  - abx per primary team   - [place] to Rehab facility today.  Chronic combined HF  -  Torsemide resumed yesterday. He is now eating and drinking. Weight is down 2 lbs and I/O negative 905 cc.  - EF 35%  Demand ischemia  - mildly elevated troponin  - chronically elevated  CKD 4  - creat is stable.  PAF  - amio  - anticoag (dose adjusted)  Patient is stable for DC to Rehab facility from cardiac standpoint.  Collier [facility] 05/02/2015, 10:53 AM

## 2015-05-02 NOTE — Progress Notes (Addendum)
Pt now wanting [facility]- they have private room and can accept pt today.  Patient will discharge to [facility] Anticipated discharge date: 05/02/15 Family notified: pt caregiver and pt dtr Meredith Mody) Transportation by [employer]- scheduled for 4pm  CSW signing off.  Domenica Reamer, [place] Social Worker [phone]

## 2015-05-02 NOTE — Discharge Summary (Signed)
[facility]  Physician Discharge Summary   Patient ID: Darrell HEILEMAN, MD MRN: [id number] DOB/AGE: November 13, 1918 81 y.o.  Admit date: 04/28/2015 Discharge date: 05/02/2015  PCP: [name], MD  DISCHARGE DIAGNOSES:  Principal Problem:   HCAP (healthcare-associated pneumonia) Active Problems:   Paroxysmal atrial fibrillation, DCCV 06/16/13   Pacemaker - St Jude   CKD (chronic kidney disease) stage 4, GFR 15-29 ml/min (HCC)   Chronic diastolic heart failure (HCC)   Low back pain   Chest pain   Elevated troponin   Pressure ulcer   Failure to thrive in adult   RECOMMENDATIONS FOR OUTPATIENT FOLLOW UP: 1. CBC and basic metabolic panel next week 2. See discussion regarding Megace 3. Patient supposedly has an appointment with his urologist next week. Please send him for the same. Foley catheter to be kept in place till then.   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 Patient has a history of pacemaker placement, paroxysmal atrial fibrillation, chronic systolic and diastolic CHF, type 2 diabetes. He was found to have mildly elevated troponin. X-ray suggested pneumonia. Patient was hospitalized for further management.  Consultations:  Cardiology  Procedures:  None  HOSPITAL COURSE:   Healthcare associated pneumonia Patient was admitted to the hospital. He did not have any significant symptoms from the pneumonia. He was initially placed on IV antibiotics. Cultures are negative. He was changed over to oral Levaquin. Seems to be tolerating it quite well. This will be continued for a few more days.   Chest pain with mildly elevated  troponin No further chest pain. EKG did not show anything concerning. Troponin remains minimally elevated. Does not appear to be cardiac. She was seen by cardiology. No further testing is planned.   Low back pain Appears to be chronic. Recently seen by neurosurgery and was placed on steroids. Continue narcotics as needed.  Chronic systolic and diastolic congestive heart failure Last measured EF was about 35-40%. Thought to be volume depleted by cardiology and given IV fluids. His diuretics were held. This has been restarted at a lower dose.   Chronic kidney disease stage III Creatinine has improved with hydration. Repeat blood work next week.  History of sick sinus syndrome, status post pacemaker Stable.  History of paroxysmal atrial fibrillation Stable. Heart rate is reasonably well controlled. Continue amiodarone, metoprolol and anticoagulation with Apixaban.  Chronic anemia Stable. Continue to monitor.  History of gout Allopurinol. Patient did complain of feet pain. According to the caregiver this been a long-standing problem and blamed on plantar fasciitis. Patient has been at least 2 different orthopedic surgeons in the past for the same. No recent falls or injuries.  Diabetes mellitus type 2 with chronic kidney disease Continue Lantus with sliding scale coverage. CBGs are well controlled.  History of obstructive uropathy with Foley catheter According to patient's caregiver, patient was seen recently by urology. They want him to keep the Foley catheter in until follow-up next week. Continue Flomax.  He was exhibiting some signs of failure to thrive as well. His oral intake remains poor. He was started on Megace. His appetite seems to be improving. Continue for now. This will need to be reassessed at the  SNF.   Overall improved. Stable for discharge back to SNF today. Cleared by cardiology.   PERTINENT LABS:  The results of significant diagnostics from this hospitalization  (including imaging, microbiology, ancillary and laboratory) are listed below for reference.    Microbiology: Recent Results (from the past 240 hour(s))  Blood culture (routine x 2)     Status: None (Preliminary result)   Collection Time: 04/28/15  4:25 AM  Result Value Ref Range Status   Specimen Description BLOOD RIGHT HAND  Final   Special Requests AEROBIC BOTTLE ONLY 5ML  Final   Culture NO GROWTH 3 DAYS  Final   Report Status PENDING  Incomplete  Blood culture (routine x 2)     Status: None (Preliminary result)   Collection Time: 04/28/15  4:27 AM  Result Value Ref Range Status   Specimen Description BLOOD LEFT HAND  Final   Special Requests BOTTLES DRAWN AEROBIC AND ANAEROBIC 5ML  Final   Culture NO GROWTH 3 DAYS  Final   Report Status PENDING  Incomplete  Urine culture     Status: None   Collection Time: 04/28/15  6:11 AM  Result Value Ref Range Status   Specimen Description URINE, RANDOM  Final   Special Requests NONE  Final   Culture NO GROWTH 1 DAY  Final   Report Status 04/29/2015 FINAL  Final     Labs: Basic Metabolic Panel:  Recent Labs Lab 04/28/15 0323 04/28/15 0437 04/29/15 0345 04/30/15 0440 05/01/15 0245 05/02/15 0539  NA 129* 132* 134* 138 136 134*  K 4.2 4.4 3.9 3.4* 3.6 3.9  CL 88* 90* 94* 100* 97* 95*  CO2 30  --  31 30 30 28   GLUCOSE 212* 206* 271* 152* 93 116*  BUN 110* 101* 95* 75* 62* 58*  CREATININE 3.53* 3.50* 3.10* 2.73* 2.49* 2.48*  CALCIUM 9.7  --  9.1 9.1 9.2 8.9   CBC:  Recent Labs Lab 04/28/15 0323 04/28/15 0437 04/28/15 1234 04/29/15 0345 04/30/15 0440  WBC 13.7*  --  11.7* 10.5 10.4  NEUTROABS 11.7*  --  9.8*  --   --   HGB 12.2* 12.2* 11.3* 11.6* 11.2*  HCT 36.2* 36.0* 33.6* 34.7* 33.0*  MCV 99.2  --  99.1 100.9* 100.9*  PLT 239  --  215 239 225   Cardiac Enzymes:  Recent Labs Lab 04/28/15 1833 04/28/15 2335 04/30/15 0650 04/30/15 1155 04/30/15 1900  TROPONINI 0.33* 0.29* 0.30* 0.30* 0.38*   BNP: BNP (last  3 results)  Recent Labs  10/04/14 1241 02/27/15 0924 04/28/15 0324  BNP 467.9* 325.4* 470.1*     CBG:  Recent Labs Lab 05/01/15 1134 05/01/15 1616 05/01/15 2106 05/02/15 0614 05/02/15 1156  GLUCAP 167* 134* 146* 130* 135*     IMAGING STUDIES Dg Chest 2 View  04/28/2015  CLINICAL DATA:  Bilateral chest tightness. EXAM: CHEST  2 VIEW COMPARISON:  03/11/2015 FINDINGS: Small bilateral pleural effusions similar to previous studies. Focal airspace disease in the right lung base posteriorly suggests focal pneumonia. Left lung is clear. No pneumothorax. Normal heart size and pulmonary vascularity. Cardiac pacemaker. Calcification of the aorta. Degenerative changes in the spine. IMPRESSION: Focal airspace disease in the right lung base suggesting pneumonia. Small bilateral pleural effusions. Electronically Signed   By: [name] M.D.   On: 04/28/2015 03:35    DISCHARGE EXAMINATION: Filed Vitals:   05/01/15 0935 05/01/15 1438 05/01/15 1941 05/02/15 0433  BP: 120/52 134/67 133/61 121/53  Pulse: 64 64 65 64  Temp:  97.5 F (36.4 C) 99.2 F (37.3 C) 97.5 F (36.4 C)  TempSrc:  Oral Oral Oral  Resp:  17 18 18   Height:      Weight:    84.9 kg (187 lb 2.7 oz)  SpO2:  99% 97% 96%   General appearance: alert, cooperative, appears stated age and no distress Resp: Clear to auscultation bilaterally. No wheezing, rales or rhonchi. Cardio: His minister is irregularly irregular. No S3, S4. No rubs, murmurs, or bruit. No pedal edema. GI: soft, non-tender; bowel sounds normal; no masses,  no organomegaly Extremities: extremities normal, atraumatic, no cyanosis or edema   DISPOSITION: SNF  Discharge Instructions    Call MD for:  difficulty breathing, headache or visual disturbances    Complete by:  As directed      Call MD for:  extreme fatigue    Complete by:  As directed      Call MD for:  persistant dizziness or light-headedness    Complete by:  As directed      Call MD  for:  persistant nausea and vomiting    Complete by:  As directed      Call MD for:  severe uncontrolled pain    Complete by:  As directed      Call MD for:  temperature >100.4    Complete by:  As directed      Diet Carb Modified    Complete by:  As directed      Discharge instructions    Complete by:  As directed   CBC and basic metabolic panel to be checked early next week. Patient currently getting a trial of Megace to improve his appetite. If there is no discernible improvement, this medication can be discontinued.  You were cared for by a hospitalist during your hospital stay. If you have any questions about your discharge medications or the care you received while you were in the hospital after you are discharged, you can call the unit and asked to speak with the hospitalist on call if the hospitalist that took care of you is not available. Once you are discharged, your primary care physician will handle any further medical issues. Please note that NO REFILLS for any discharge medications will be authorized once you are discharged, as it is imperative that you return to your primary care physician (or establish a relationship with a primary care physician if you do not have one) for your aftercare needs so that they can reassess your need for medications and monitor your lab values. If you do not have a primary care physician, you can call [phone] for a physician referral.     Increase activity slowly    Complete by:  As directed            ALLERGIES:  Allergies  Allergen Reactions  . Statins Other (See Comments)    rhabdomyolisis  . Aggrenox [Aspirin-Dipyridamole Er] Other (See Comments)    Dizziness  . Carvedilol Other (See Comments)    Dizziness  . Claritin [Loratadine] Other (See Comments)    Dizziness & drowsiness   . Cymbalta [Duloxetine Hcl]     Confusion   . Latanoprost Itching    Watery   . Lyrica [Pregabalin] Other (See Comments)    Makes him dizzy. Takes him  days to recover.  . Mucinex [Guaifenesin Er] Other (See Comments)    Dizziness, drowsiness  . Other Other (See Comments)    Preservatives in glaucoma gtts cause eye  irritation, has to use preservative free  . Phenergan [Promethazine Hcl] Other (See Comments)    Causes severe confusion  . Plavix [Clopidogrel Bisulfate] Other (See Comments)    Dizziness  . Rapaflo [Silodosin] Other (See Comments)    Dizziness for about 30- 45  Minutes At time of preop appointment on 08/26/2014 patient denies any problems with this medication  . Travatan Z [Travoprost] Other (See Comments)    Eye irritation     Current Discharge Medication List    START taking these medications   Details  levofloxacin (LEVAQUIN) 500 MG tablet Take 1 tablet (500 mg total) by mouth every other day. For 5 more doses    megestrol (MEGACE) 400 MG/10ML suspension Take 5 mLs (200 mg total) by mouth daily. Qty: 240 mL, Refills: 0      CONTINUE these medications which have CHANGED   Details  HYDROcodone-acetaminophen (NORCO/VICODIN) 5-325 MG tablet Take 1-2 tablets by mouth every 4 (four) hours as needed for moderate pain. Qty: 30 tablet, Refills: 0    insulin glargine (LANTUS) 100 UNIT/ML injection Inject 0.12 mLs (12 Units total) into the skin at bedtime. Or as directed by sliding scale. Qty: 10 mL, Refills: 11    torsemide (DEMADEX) 20 MG tablet Take 3 tablets (60 mg total) by mouth daily.      CONTINUE these medications which have NOT CHANGED   Details  allopurinol (ZYLOPRIM) 100 MG tablet Take 100 mg by mouth 2 (two) times daily.     amiodarone (PACERONE) 200 MG tablet Take 1 tablet (200 mg total) by mouth at bedtime. Qty: 30 tablet, Refills: 6    apixaban (ELIQUIS) 2.5 MG TABS tablet Take 1 tablet (2.5 mg total) by mouth 2 (two) times daily. Qty: 60 tablet, Refills: 11    Carboxymethylcellul-Glycerin 0.5-0.9 % SOLN Place 1 drop into both eyes 2 (two) times daily.     ferrous sulfate 325 (65 FE) MG tablet  Take 325 mg by mouth every other day.    insulin aspart (NOVOLOG FLEXPEN) 100 UNIT/ML FlexPen Inject 9 Units into the skin 3 (three) times daily with meals. Per sliding scale:  CBG <175 no insulin (unless consuming large amounts of carbs; 6-10 units for CBG >175 based on actual CBG and number of carbs in meal; >325 call MD    isosorbide mononitrate (IMDUR) 60 MG 24 hr tablet Take 1 tablet (60 mg total) by mouth daily. Qty: 30 tablet, Refills: 11    methocarbamol (ROBAXIN) 500 MG tablet Take 1 tablet (500 mg total) by mouth every 8 (eight) hours as needed for muscle spasms. Qty: 30 tablet, Refills: 0    metoprolol tartrate (LOPRESSOR) 25 MG tablet Take 1 tablet (25 mg total) by mouth daily. Qty: 30 tablet, Refills: 11    nitroGLYCERIN (NITROSTAT) 0.4 MG SL tablet Place 1 tablet (0.4 mg total) under the tongue every 5 (five) minutes as needed for chest pain. Qty: 25 tablet, Refills: 3   Associated Diagnoses: Angina pectoris (HCC)    OVER THE COUNTER MEDICATION Take 1 packet by mouth daily. [employer] Made Diabetic Pak (6 tablet combo)    polyethylene glycol (MIRALAX / GLYCOLAX) packet Take 17 g by mouth 2 (two) times daily.     polyvinyl alcohol (LIQUIFILM TEARS) 1.4 % ophthalmic solution Place 1 drop into both eyes 2 (two) times daily. Qty: 15 mL, Refills: 0    potassium chloride SA (K-DUR,KLOR-CON) 20 MEQ tablet Take 1 tablet (20 mEq total) by mouth daily. Qty: 30 tablet, Refills:  6    Tafluprost 0.0015 % SOLN Place 1 drop into both eyes at bedtime. ZIOPTAN    tamsulosin (FLOMAX) 0.4 MG CAPS capsule Take 1 capsule (0.4 mg total) by mouth daily after supper. Qty: 30 capsule, Refills: 0       Follow-up Information    Follow up with [name], MD. Schedule an appointment as soon as possible for a visit in 1 week.   Specialty:  Internal Medicine   Why:  post hospitalization follow up   Contact information:   [address] [place] [address] [phone]        Follow up with [name], MD. Schedule an appointment as soon as possible for a visit in 4 weeks.   Specialty:  Cardiology   Why:  post hospitalization follow up   Contact information:   [address] [place] [address] [phone]       TOTAL DISCHARGE TIME: 35 minutes  [facility]  [facility] Pager [phone]  05/02/2015, 12:13 PM

## 2015-05-02 NOTE — Progress Notes (Signed)
Report given to [facility] nurse

## 2015-05-03 LAB — CULTURE, BLOOD (ROUTINE X 2)
Culture: NO GROWTH
Culture: NO GROWTH

## 2015-05-12 ENCOUNTER — Inpatient Hospital Stay (HOSPITAL_COMMUNITY)
Admission: EM | Admit: 2015-05-12 | Discharge: 2015-06-15 | DRG: 291 | Disposition: E | Payer: Medicare Other | Attending: Internal Medicine | Admitting: Internal Medicine

## 2015-05-12 ENCOUNTER — Encounter (HOSPITAL_COMMUNITY): Payer: Self-pay

## 2015-05-12 ENCOUNTER — Emergency Department (HOSPITAL_COMMUNITY): Payer: Medicare Other

## 2015-05-12 DIAGNOSIS — E860 Dehydration: Secondary | ICD-10-CM | POA: Diagnosis present

## 2015-05-12 DIAGNOSIS — I5043 Acute on chronic combined systolic (congestive) and diastolic (congestive) heart failure: Secondary | ICD-10-CM | POA: Diagnosis present

## 2015-05-12 DIAGNOSIS — Z79899 Other long term (current) drug therapy: Secondary | ICD-10-CM | POA: Diagnosis not present

## 2015-05-12 DIAGNOSIS — R0602 Shortness of breath: Secondary | ICD-10-CM

## 2015-05-12 DIAGNOSIS — Z7901 Long term (current) use of anticoagulants: Secondary | ICD-10-CM | POA: Diagnosis not present

## 2015-05-12 DIAGNOSIS — Z950 Presence of cardiac pacemaker: Secondary | ICD-10-CM | POA: Diagnosis not present

## 2015-05-12 DIAGNOSIS — J189 Pneumonia, unspecified organism: Secondary | ICD-10-CM | POA: Diagnosis present

## 2015-05-12 DIAGNOSIS — N184 Chronic kidney disease, stage 4 (severe): Secondary | ICD-10-CM | POA: Diagnosis present

## 2015-05-12 DIAGNOSIS — I480 Paroxysmal atrial fibrillation: Secondary | ICD-10-CM | POA: Diagnosis present

## 2015-05-12 DIAGNOSIS — I130 Hypertensive heart and chronic kidney disease with heart failure and stage 1 through stage 4 chronic kidney disease, or unspecified chronic kidney disease: Secondary | ICD-10-CM | POA: Diagnosis present

## 2015-05-12 DIAGNOSIS — E871 Hypo-osmolality and hyponatremia: Secondary | ICD-10-CM | POA: Diagnosis present

## 2015-05-12 DIAGNOSIS — Z8673 Personal history of transient ischemic attack (TIA), and cerebral infarction without residual deficits: Secondary | ICD-10-CM

## 2015-05-12 DIAGNOSIS — E1122 Type 2 diabetes mellitus with diabetic chronic kidney disease: Secondary | ICD-10-CM | POA: Diagnosis present

## 2015-05-12 DIAGNOSIS — Z66 Do not resuscitate: Secondary | ICD-10-CM | POA: Diagnosis present

## 2015-05-12 DIAGNOSIS — I495 Sick sinus syndrome: Secondary | ICD-10-CM | POA: Diagnosis present

## 2015-05-12 DIAGNOSIS — Z515 Encounter for palliative care: Secondary | ICD-10-CM | POA: Diagnosis present

## 2015-05-12 DIAGNOSIS — R0600 Dyspnea, unspecified: Secondary | ICD-10-CM | POA: Diagnosis not present

## 2015-05-12 DIAGNOSIS — M1000 Idiopathic gout, unspecified site: Secondary | ICD-10-CM | POA: Diagnosis present

## 2015-05-12 DIAGNOSIS — I5042 Chronic combined systolic (congestive) and diastolic (congestive) heart failure: Secondary | ICD-10-CM | POA: Diagnosis not present

## 2015-05-12 DIAGNOSIS — R410 Disorientation, unspecified: Secondary | ICD-10-CM | POA: Diagnosis not present

## 2015-05-12 DIAGNOSIS — R0902 Hypoxemia: Secondary | ICD-10-CM

## 2015-05-12 DIAGNOSIS — Z794 Long term (current) use of insulin: Secondary | ICD-10-CM | POA: Diagnosis not present

## 2015-05-12 DIAGNOSIS — M545 Low back pain: Secondary | ICD-10-CM | POA: Diagnosis present

## 2015-05-12 DIAGNOSIS — I5032 Chronic diastolic (congestive) heart failure: Secondary | ICD-10-CM

## 2015-05-12 DIAGNOSIS — I272 Other secondary pulmonary hypertension: Secondary | ICD-10-CM | POA: Diagnosis present

## 2015-05-12 DIAGNOSIS — Z96652 Presence of left artificial knee joint: Secondary | ICD-10-CM | POA: Diagnosis present

## 2015-05-12 DIAGNOSIS — I5023 Acute on chronic systolic (congestive) heart failure: Secondary | ICD-10-CM | POA: Diagnosis not present

## 2015-05-12 DIAGNOSIS — Y95 Nosocomial condition: Secondary | ICD-10-CM | POA: Diagnosis present

## 2015-05-12 DIAGNOSIS — I5022 Chronic systolic (congestive) heart failure: Secondary | ICD-10-CM | POA: Diagnosis present

## 2015-05-12 DIAGNOSIS — Z888 Allergy status to other drugs, medicaments and biological substances status: Secondary | ICD-10-CM

## 2015-05-12 DIAGNOSIS — N179 Acute kidney failure, unspecified: Secondary | ICD-10-CM | POA: Diagnosis present

## 2015-05-12 DIAGNOSIS — Z87891 Personal history of nicotine dependence: Secondary | ICD-10-CM

## 2015-05-12 DIAGNOSIS — K5900 Constipation, unspecified: Secondary | ICD-10-CM | POA: Diagnosis not present

## 2015-05-12 DIAGNOSIS — E875 Hyperkalemia: Secondary | ICD-10-CM | POA: Diagnosis present

## 2015-05-12 DIAGNOSIS — N139 Obstructive and reflux uropathy, unspecified: Secondary | ICD-10-CM | POA: Diagnosis present

## 2015-05-12 DIAGNOSIS — E878 Other disorders of electrolyte and fluid balance, not elsewhere classified: Secondary | ICD-10-CM | POA: Diagnosis present

## 2015-05-12 DIAGNOSIS — J9601 Acute respiratory failure with hypoxia: Secondary | ICD-10-CM | POA: Diagnosis present

## 2015-05-12 DIAGNOSIS — R1314 Dysphagia, pharyngoesophageal phase: Secondary | ICD-10-CM | POA: Insufficient documentation

## 2015-05-12 DIAGNOSIS — E872 Acidosis, unspecified: Secondary | ICD-10-CM

## 2015-05-12 DIAGNOSIS — I739 Peripheral vascular disease, unspecified: Secondary | ICD-10-CM | POA: Diagnosis present

## 2015-05-12 DIAGNOSIS — E1142 Type 2 diabetes mellitus with diabetic polyneuropathy: Secondary | ICD-10-CM | POA: Diagnosis present

## 2015-05-12 DIAGNOSIS — N183 Chronic kidney disease, stage 3 unspecified: Secondary | ICD-10-CM | POA: Diagnosis present

## 2015-05-12 DIAGNOSIS — D649 Anemia, unspecified: Secondary | ICD-10-CM | POA: Diagnosis present

## 2015-05-12 DIAGNOSIS — E785 Hyperlipidemia, unspecified: Secondary | ICD-10-CM | POA: Diagnosis present

## 2015-05-12 DIAGNOSIS — I350 Nonrheumatic aortic (valve) stenosis: Secondary | ICD-10-CM | POA: Diagnosis present

## 2015-05-12 LAB — COMPREHENSIVE METABOLIC PANEL
ALK PHOS: 65 U/L (ref 38–126)
ALT: 77 U/L — AB (ref 17–63)
ANION GAP: 11 (ref 5–15)
AST: 80 U/L — ABNORMAL HIGH (ref 15–41)
Albumin: 2.7 g/dL — ABNORMAL LOW (ref 3.5–5.0)
BILIRUBIN TOTAL: 1.1 mg/dL (ref 0.3–1.2)
BUN: 40 mg/dL — ABNORMAL HIGH (ref 6–20)
CALCIUM: 9 mg/dL (ref 8.9–10.3)
CO2: 22 mmol/L (ref 22–32)
CREATININE: 3.11 mg/dL — AB (ref 0.61–1.24)
Chloride: 97 mmol/L — ABNORMAL LOW (ref 101–111)
GFR calc non Af Amer: 16 mL/min — ABNORMAL LOW (ref >60)
GFR, EST AFRICAN AMERICAN: 18 mL/min — AB (ref >60)
GLUCOSE: 306 mg/dL — AB (ref 65–99)
Potassium: 5.4 mmol/L — ABNORMAL HIGH (ref 3.5–5.1)
Sodium: 130 mmol/L — ABNORMAL LOW (ref 135–145)
TOTAL PROTEIN: 6.3 g/dL — AB (ref 6.5–8.1)

## 2015-05-12 LAB — URINE MICROSCOPIC-ADD ON

## 2015-05-12 LAB — APTT: APTT: 37 s (ref 24–37)

## 2015-05-12 LAB — CBC WITH DIFFERENTIAL/PLATELET
Basophils Absolute: 0 10*3/uL (ref 0.0–0.1)
Basophils Relative: 0 %
Eosinophils Absolute: 0 10*3/uL (ref 0.0–0.7)
Eosinophils Relative: 0 %
HEMATOCRIT: 34.2 % — AB (ref 39.0–52.0)
HEMOGLOBIN: 11.6 g/dL — AB (ref 13.0–17.0)
LYMPHS ABS: 1 10*3/uL (ref 0.7–4.0)
LYMPHS PCT: 14 %
MCH: 33.5 pg (ref 26.0–34.0)
MCHC: 33.9 g/dL (ref 30.0–36.0)
MCV: 98.8 fL (ref 78.0–100.0)
MONOS PCT: 8 %
Monocytes Absolute: 0.6 10*3/uL (ref 0.1–1.0)
NEUTROS ABS: 5.7 10*3/uL (ref 1.7–7.7)
NEUTROS PCT: 78 %
Platelets: 184 10*3/uL (ref 150–400)
RBC: 3.46 MIL/uL — AB (ref 4.22–5.81)
RDW: 16.9 % — ABNORMAL HIGH (ref 11.5–15.5)
WBC: 7.3 10*3/uL (ref 4.0–10.5)

## 2015-05-12 LAB — I-STAT ARTERIAL BLOOD GAS, ED
BICARBONATE: 23.5 meq/L (ref 20.0–24.0)
O2 SAT: 97 %
PH ART: 7.456 — AB (ref 7.350–7.450)
PO2 ART: 85 mmHg (ref 80.0–100.0)
TCO2: 24 mmol/L (ref 0–100)
pCO2 arterial: 33.3 mmHg — ABNORMAL LOW (ref 35.0–45.0)

## 2015-05-12 LAB — URINALYSIS, ROUTINE W REFLEX MICROSCOPIC
Bilirubin Urine: NEGATIVE
Glucose, UA: 250 mg/dL — AB
KETONES UR: NEGATIVE mg/dL
NITRITE: NEGATIVE
PROTEIN: NEGATIVE mg/dL
Specific Gravity, Urine: 1.015 (ref 1.005–1.030)
pH: 6.5 (ref 5.0–8.0)

## 2015-05-12 LAB — STREP PNEUMONIAE URINARY ANTIGEN: STREP PNEUMO URINARY ANTIGEN: NEGATIVE

## 2015-05-12 LAB — I-STAT TROPONIN, ED: TROPONIN I, POC: 0.36 ng/mL — AB (ref 0.00–0.08)

## 2015-05-12 LAB — I-STAT CG4 LACTIC ACID, ED
LACTIC ACID, VENOUS: 2.64 mmol/L — AB (ref 0.5–2.0)
Lactic Acid, Venous: 3.04 mmol/L (ref 0.5–2.0)

## 2015-05-12 LAB — PROTIME-INR
INR: 1.42 (ref 0.00–1.49)
Prothrombin Time: 17.4 seconds — ABNORMAL HIGH (ref 11.6–15.2)

## 2015-05-12 LAB — BRAIN NATRIURETIC PEPTIDE: B Natriuretic Peptide: 625.3 pg/mL — ABNORMAL HIGH (ref 0.0–100.0)

## 2015-05-12 LAB — GLUCOSE, CAPILLARY: GLUCOSE-CAPILLARY: 292 mg/dL — AB (ref 65–99)

## 2015-05-12 LAB — MRSA PCR SCREENING: MRSA by PCR: NEGATIVE

## 2015-05-12 MED ORDER — POLYETHYLENE GLYCOL 3350 17 G PO PACK
17.0000 g | PACK | Freq: Two times a day (BID) | ORAL | Status: DC
  Filled 2015-05-12: qty 1

## 2015-05-12 MED ORDER — DEXTROSE 5 % IV SOLN
1.0000 g | INTRAVENOUS | Status: DC
  Administered 2015-05-12 – 2015-05-14 (×3): 1 g via INTRAVENOUS
  Filled 2015-05-12 (×4): qty 1

## 2015-05-12 MED ORDER — LATANOPROST 0.005 % OP SOLN
1.0000 [drp] | Freq: Every day | OPHTHALMIC | Status: DC
  Administered 2015-05-14 – 2015-05-15 (×2): 1 [drp] via OPHTHALMIC
  Filled 2015-05-12 (×2): qty 2.5

## 2015-05-12 MED ORDER — METOPROLOL TARTRATE 25 MG PO TABS
25.0000 mg | ORAL_TABLET | Freq: Every day | ORAL | Status: DC
  Administered 2015-05-13 – 2015-05-18 (×5): 25 mg via ORAL
  Filled 2015-05-12 (×7): qty 1

## 2015-05-12 MED ORDER — MEGESTROL ACETATE 400 MG/10ML PO SUSP
200.0000 mg | Freq: Every day | ORAL | Status: DC
  Administered 2015-05-13 – 2015-05-18 (×5): 200 mg via ORAL
  Filled 2015-05-12 (×8): qty 5

## 2015-05-12 MED ORDER — TAMSULOSIN HCL 0.4 MG PO CAPS
0.4000 mg | ORAL_CAPSULE | Freq: Every day | ORAL | Status: DC
  Administered 2015-05-12 – 2015-05-18 (×7): 0.4 mg via ORAL
  Filled 2015-05-12 (×7): qty 1

## 2015-05-12 MED ORDER — INSULIN GLARGINE 100 UNIT/ML ~~LOC~~ SOLN
12.0000 [IU] | Freq: Every day | SUBCUTANEOUS | Status: DC
  Administered 2015-05-12 – 2015-05-15 (×4): 12 [IU] via SUBCUTANEOUS
  Filled 2015-05-12 (×5): qty 0.12

## 2015-05-12 MED ORDER — FERROUS SULFATE 325 (65 FE) MG PO TABS
325.0000 mg | ORAL_TABLET | Freq: Every day | ORAL | Status: DC
  Administered 2015-05-13 – 2015-05-18 (×4): 325 mg via ORAL
  Filled 2015-05-12 (×4): qty 1

## 2015-05-12 MED ORDER — AMIODARONE HCL 200 MG PO TABS
200.0000 mg | ORAL_TABLET | Freq: Every day | ORAL | Status: DC
  Administered 2015-05-12 – 2015-05-18 (×6): 200 mg via ORAL
  Filled 2015-05-12 (×7): qty 1

## 2015-05-12 MED ORDER — SODIUM CHLORIDE 0.9 % IV SOLN
INTRAVENOUS | Status: DC
  Administered 2015-05-12 – 2015-05-13 (×2): via INTRAVENOUS

## 2015-05-12 MED ORDER — HYDROCODONE-ACETAMINOPHEN 5-325 MG PO TABS
1.0000 | ORAL_TABLET | ORAL | Status: DC | PRN
  Administered 2015-05-15 – 2015-05-19 (×4): 1 via ORAL
  Filled 2015-05-12 (×4): qty 1
  Filled 2015-05-12: qty 2

## 2015-05-12 MED ORDER — INSULIN ASPART 100 UNIT/ML ~~LOC~~ SOLN
0.0000 [IU] | Freq: Three times a day (TID) | SUBCUTANEOUS | Status: DC
  Administered 2015-05-13: 1 [IU] via SUBCUTANEOUS
  Administered 2015-05-13 – 2015-05-14 (×2): 3 [IU] via SUBCUTANEOUS
  Administered 2015-05-14: 2 [IU] via SUBCUTANEOUS
  Administered 2015-05-15 (×3): 3 [IU] via SUBCUTANEOUS
  Administered 2015-05-16: 2 [IU] via SUBCUTANEOUS
  Administered 2015-05-16: 3 [IU] via SUBCUTANEOUS
  Administered 2015-05-16: 7 [IU] via SUBCUTANEOUS
  Administered 2015-05-17: 3 [IU] via SUBCUTANEOUS
  Administered 2015-05-17: 2 [IU] via SUBCUTANEOUS
  Administered 2015-05-17 – 2015-05-18 (×2): 3 [IU] via SUBCUTANEOUS
  Administered 2015-05-18: 2 [IU] via SUBCUTANEOUS

## 2015-05-12 MED ORDER — ISOSORBIDE MONONITRATE ER 60 MG PO TB24
60.0000 mg | ORAL_TABLET | Freq: Every day | ORAL | Status: DC
  Administered 2015-05-13 – 2015-05-18 (×6): 60 mg via ORAL
  Filled 2015-05-12 (×6): qty 1

## 2015-05-12 MED ORDER — POLYVINYL ALCOHOL 1.4 % OP SOLN
1.0000 [drp] | Freq: Two times a day (BID) | OPHTHALMIC | Status: DC
  Administered 2015-05-13: 1 [drp] via OPHTHALMIC
  Filled 2015-05-12: qty 15

## 2015-05-12 MED ORDER — CARBOXYMETHYLCELLUL-GLYCERIN 0.5-0.9 % OP SOLN: 1.0000 [drp] | Freq: Two times a day (BID) | OPHTHALMIC | Status: DC

## 2015-05-12 MED ORDER — VANCOMYCIN HCL IN DEXTROSE 1-5 GM/200ML-% IV SOLN
1000.0000 mg | INTRAVENOUS | Status: DC
  Administered 2015-05-12 – 2015-05-14 (×2): 1000 mg via INTRAVENOUS
  Filled 2015-05-12 (×3): qty 200

## 2015-05-12 MED ORDER — ONDANSETRON HCL 4 MG PO TABS: 4.0000 mg | ORAL_TABLET | Freq: Three times a day (TID) | ORAL | Status: DC | PRN

## 2015-05-12 MED ORDER — BETHANECHOL CHLORIDE 25 MG PO TABS
25.0000 mg | ORAL_TABLET | Freq: Four times a day (QID) | ORAL | Status: DC
  Administered 2015-05-12 – 2015-05-18 (×18): 25 mg via ORAL
  Filled 2015-05-12 (×33): qty 1

## 2015-05-12 MED ORDER — ALLOPURINOL 100 MG PO TABS
100.0000 mg | ORAL_TABLET | Freq: Two times a day (BID) | ORAL | Status: DC
  Administered 2015-05-12 – 2015-05-18 (×12): 100 mg via ORAL
  Filled 2015-05-12 (×13): qty 1

## 2015-05-12 MED ORDER — PIPERACILLIN-TAZOBACTAM IN DEX 2-0.25 GM/50ML IV SOLN
2.2500 g | Freq: Four times a day (QID) | INTRAVENOUS | Status: DC
  Administered 2015-05-12: 2.25 g via INTRAVENOUS
  Filled 2015-05-12 (×3): qty 50

## 2015-05-12 MED ORDER — FUROSEMIDE 10 MG/ML IJ SOLN
60.0000 mg | Freq: Once | INTRAMUSCULAR | Status: DC
  Filled 2015-05-12: qty 6

## 2015-05-12 MED ORDER — APIXABAN 2.5 MG PO TABS
2.5000 mg | ORAL_TABLET | Freq: Two times a day (BID) | ORAL | Status: DC
  Administered 2015-05-12 – 2015-05-18 (×12): 2.5 mg via ORAL
  Filled 2015-05-12 (×13): qty 1

## 2015-05-12 NOTE — H&P (Signed)
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  PMH significant for CHF, CKD stage 4, HTN, DM2, pacemaker in June, recently admitted for HCAP earlier this month.  He was treated with IV abx, switched to levaquin, and discharged to [facility].  At [facility] he was continued on levaquin, but developed acute worsening dyspnea today, sent to ED for respiratory distress and O2 sat in the 80s.  Review of Systems: Systems reviewed.  As above, otherwise negative  Past Medical History  Diagnosis Date  . Hyperlipidemia   . Spinal stenosis   . Anemia   . Carotid artery disease ([place])     a. s/p prior carotid endarterectomy.  [name] PVD (peripheral vascular disease) ([place])   . Hyponatremia     Chronic  . CVA (cerebral infarction)     a. When asked to clarify patient says he was told thumb numbness may be TIA. He does not recall formal stroke. CT head results are only available through [facility], not in [facility].  . CKD (chronic kidney disease) stage 4, GFR 15-29 ml/min (HCC)   . Long term (current) use of anticoagulants   . PAF (paroxysmal atrial fibrillation) ([place])     a. Asymptomatic. On Eliquis. CHADSVASC 7.  . Sick sinus syndrome ([place])     With DDD St. Jude PM, initially placed in 1993 by Dr. Nils Pyle. Device upgrade 10/2002 to DDD, by Dr. Rollene Fare complicated by bleeding. Subsequent gen change 2011.  [name] Hypertensive cardiovascular disease   . Chronic diastolic congestive heart failure ([place])     ECHO 05/20/12 LVEF estimated by 2D at 55-60%  . Arthritis   . Gait disorder   . Diabetic peripheral neuropathy associated with type 2 diabetes mellitus ([place])   . Diabetes mellitus (HCC)     insulin dependent  . Elevated troponin I  level 06/22/2014    a. 06/2014 felt due to demand ischemia.  . Chronic anticoagulation     For PAF, on Eliquis   . Urinary retention   . Pulmonary hypertension ([place])   . Foot drop, left 07/21/2014  . Asterixis 07/21/2014  . Hypertension   . Presence of permanent cardiac pacemaker   . Bilateral plantar fasciitis   . Pneumonia     hx of pneumonia x 1   . HCAP (healthcare-associated pneumonia) 04/28/2015   Past Surgical History  Procedure Laterality Date  . Lumbar laminectomy  1995  . Back surgery    . Total knee arthroplasty Left   . Quadriceps tendon repair    . Cataract extraction    . Carotid endarterectomy    . Yag laser application Right 3/81/8299    Procedure: YAG LASER CAPSULOTOMY OF RIGHT EYE;  Surgeon: [name], MD;  Location: [place];  Service: Ophthalmology;  Laterality: Right;  . Tonsillectomy    . Pacemaker insertion      DDD St. Jude PM, initially placed in 1993 by Dr. Nils Pyle. Device upgrade 10/2002 to DDD, by Dr. Rollene Fare complicated by bleeding.  . Carotid endarterectomy Right 2006  . Transurethral resection of prostate    . Cardioversion N/A 06/16/2013    Procedure: CARDIOVERSION BEDSIDE;  Surgeon: [name], MD;  [facility];  Service: Cardiovascular;  Laterality: N/A;  . Pacemaker generator change  12/09/2009    SJM Accent DR RF gen change by Dr [name]  . Insert / replace / remove pacemaker      2004   . Transurethral resection of prostate N/A 08/27/2014    Procedure: TRANSURETHRAL RESECTION OF THE PROSTATE WITH GYRUS INSTRUMENTS;  Surgeon: [name], MD;  Location: [facility];  Service: Urology;  Laterality: N/A;  . Cystoscopy N/A 08/27/2014    Procedure: CYSTOSCOPY;  Surgeon: [name], MD;  Location: [facility];  Service: Urology;  Laterality: N/A;   Social History:  reports that he quit smoking about 57 years ago. His smoking use included Pipe and Cigarettes. He quit after 2 years of use. He has never used smokeless tobacco. He reports that he  drinks alcohol. He reports that he does not use illicit drugs.  Allergies  Allergen Reactions  . Statins Other (See Comments)    rhabdomyolisis  . Aggrenox [Aspirin-Dipyridamole Er] Other (See Comments)    Dizziness  . Carvedilol Other (See Comments)    Dizziness  . Claritin [Loratadine] Other (See Comments)    Dizziness & drowsiness   . Cymbalta [Duloxetine Hcl]     Confusion   . Latanoprost Itching    Watery   . Lyrica [Pregabalin] Other (See Comments)    Makes him dizzy. Takes him days to recover.  . Mucinex [Guaifenesin Er] Other (See Comments)    Dizziness, drowsiness  . Other Other (See Comments)    Preservatives in glaucoma gtts cause eye irritation, has to use preservative free  . Phenergan [Promethazine Hcl] Other (See Comments)    Causes severe confusion  . Plavix [Clopidogrel Bisulfate] Other (See Comments)    Dizziness  . Rapaflo [Silodosin] Other (See Comments)    Dizziness for about 30- 45  Minutes At time of preop appointment on 08/26/2014 patient denies any problems with this medication  . Travatan Z [Travoprost] Other (See Comments)    Eye irritation    Family History  Problem Relation Age of Onset  . Multiple myeloma Father   . Hypertension Mother   . Healthy Sister   . COPD Sister   . Heart failure Sister      Prior to Admission medications   Medication Sig Start Date End Date Taking? Authorizing Provider  allopurinol (ZYLOPRIM) 100 MG tablet Take 100 mg by mouth 2 (two) times daily.    Yes Historical Provider, MD  apixaban (ELIQUIS) 2.5 MG TABS tablet Take 1 tablet (2.5 mg total) by mouth 2 (two) times daily. 12/27/14  Yes Belva Crome, MD  bethanechol (URECHOLINE) 25 MG tablet Take 25 mg by mouth 4 (four) times daily.   Yes Historical Provider, MD  Carboxymethylcellul-Glycerin 0.5-0.9 % SOLN Place 1 drop into both eyes 2 (two) times daily.    Yes Historical Provider, MD  ferrous sulfate 325 (65 FE) MG tablet Take 325 mg by mouth daily with  breakfast.    Yes Historical Provider, MD  HYDROcodone-acetaminophen (NORCO/VICODIN) 5-325 MG tablet Take 1-2 tablets by mouth every 4 (four) hours as needed for moderate pain. 05/02/15  Yes Bonnielee Haff, MD  insulin aspart (NOVOLOG FLEXPEN) 100 UNIT/ML FlexPen Inject 9 Units into the skin 3 (three) times daily with meals. Per sliding scale:  CBG <175 no insulin (unless consuming large amounts of carbs; 6-10 units for CBG >175 based on actual CBG and number of carbs in meal; >325 call MD   Yes Historical Provider, MD  insulin glargine (LANTUS) 100  UNIT/ML injection Inject 0.12 mLs (12 Units total) into the skin at bedtime. Or as directed by sliding scale. 05/02/15  Yes Bonnielee Haff, MD  isosorbide mononitrate (IMDUR) 60 MG 24 hr tablet Take 1 tablet (60 mg total) by mouth daily. 03/07/15  Yes Belva Crome, MD  megestrol (MEGACE) 400 MG/10ML suspension Take 5 mLs (200 mg total) by mouth daily. 05/02/15  Yes Bonnielee Haff, MD  Melatonin 5 MG CAPS Take 5 mg by mouth at bedtime as needed (sleep).   Yes Historical Provider, MD  metoprolol tartrate (LOPRESSOR) 25 MG tablet Take 1 tablet (25 mg total) by mouth daily. 02/28/15  Yes Belva Crome, MD  nitroGLYCERIN (NITROSTAT) 0.4 MG SL tablet Place 1 tablet (0.4 mg total) under the tongue every 5 (five) minutes as needed for chest pain. 12/03/13  Yes Belva Crome, MD  ondansetron (ZOFRAN) 4 MG tablet Take 4 mg by mouth every 8 (eight) hours as needed for nausea or vomiting.   Yes Historical Provider, MD  polyethylene glycol (MIRALAX / GLYCOLAX) packet Take 17 g by mouth 2 (two) times daily.    Yes Historical Provider, MD  polyvinyl alcohol (LIQUIFILM TEARS) 1.4 % ophthalmic solution Place 1 drop into both eyes 2 (two) times daily. 04/26/15  Yes Maryann Mikhail, DO  potassium chloride SA (K-DUR,KLOR-CON) 20 MEQ tablet Take 1 tablet (20 mEq total) by mouth daily. 09/08/14  Yes Debbe Odea, MD  Tafluprost 0.0015 % SOLN Place 1 drop into both eyes at bedtime.  ZIOPTAN   Yes Historical Provider, MD  tamsulosin (FLOMAX) 0.4 MG CAPS capsule Take 1 capsule (0.4 mg total) by mouth daily after supper. 04/19/15  Yes Janece Canterbury, MD  torsemide (DEMADEX) 20 MG tablet Take 3 tablets (60 mg total) by mouth daily. 05/02/15  Yes Bonnielee Haff, MD  tuberculin 5 UNIT/0.1ML injection Inject 0.1 mLs into the skin once.   Yes Historical Provider, MD  amiodarone (PACERONE) 200 MG tablet Take 1 tablet (200 mg total) by mouth at bedtime. 09/17/14   Belva Crome, MD   Physical Exam: Filed Vitals:   05/04/2015 1915 04/22/2015 1930  BP: 130/59 118/74  Pulse: 65 60  Temp:    Resp: 20 19    BP 118/74 mmHg  Pulse 60  Temp(Src) 98.1 F (36.7 C)  Resp 19  SpO2 100%  General Appearance:    Alert, mildly confused, no distress, appears stated age  Head:    Normocephalic, atraumatic  Eyes:    PERRL, EOMI, sclera non-icteric        Nose:   Nares without drainage or epistaxis. Mucosa, turbinates normal  Throat:   Moist mucous membranes. Oropharynx without erythema or exudate.  Neck:   Supple. No carotid bruits.  No thyromegaly.  No lymphadenopathy.   Back:     No CVA tenderness, no spinal tenderness  Lungs:     Diminished, bibasilar rales.  Chest wall:    No tenderness to palpitation  Heart:    Regular rate and rhythm without murmurs, gallops, rubs  Abdomen:     Soft, non-tender, nondistended, normal bowel sounds, no organomegaly  Genitalia:    deferred  Rectal:    deferred  Extremities:   No clubbing, cyanosis or edema.  Pulses:   2+ and symmetric all extremities  Skin:   Skin color, texture, turgor normal, no rashes or lesions  Lymph nodes:   Cervical, supraclavicular, and axillary nodes normal  Neurologic:   CNII-XII intact. Normal strength, sensation and reflexes  throughout    Labs on Admission:  Basic Metabolic Panel:  Recent Labs Lab 04/23/2015 1719  NA 130*  K 5.4*  CL 97*  CO2 22  GLUCOSE 306*  BUN 40*  CREATININE 3.11*  CALCIUM 9.0    Liver Function Tests:  Recent Labs Lab 05/06/2015 1719  AST 80*  ALT 77*  ALKPHOS 65  BILITOT 1.1  PROT 6.3*  ALBUMIN 2.7*   No results for input(s): LIPASE, AMYLASE in the last 168 hours. No results for input(s): AMMONIA in the last 168 hours. CBC:  Recent Labs Lab 04/17/2015 1719  WBC 7.3  NEUTROABS 5.7  HGB 11.6*  HCT 34.2*  MCV 98.8  PLT 184   Cardiac Enzymes: No results for input(s): CKTOTAL, CKMB, CKMBINDEX, TROPONINI in the last 168 hours.  BNP (last 3 results)  Recent Labs  07/16/14 1213  PROBNP 460.0*   CBG: No results for input(s): GLUCAP in the last 168 hours.  Radiological Exams on Admission: Dg Chest Portable 1 View  05/01/2015  CLINICAL DATA:  Dyspnea for 1 day. EXAM: PORTABLE CHEST 1 VIEW COMPARISON:  04/28/2015. FINDINGS: Borderline enlarged cardiac silhouette with a mild decrease in size. The interstitial markings remain prominent. Interval ill-defined opacity at both lung bases, greater on the right. Stable left subclavian pacemaker leads. Thoracic spine degenerative changes. IMPRESSION: 1. Bibasilar atelectasis or pneumonia. 2. Small right pleural effusion and possible small left pleural effusion. Electronically Signed   By: Claudie Revering M.D.   On: 04/14/2015 17:40    EKG: Independently reviewed.  Assessment/Plan Principal Problem:   HCAP (healthcare-associated pneumonia) Active Problems:   Type 2 diabetes mellitus with peripheral neuropathy (HCC)   Chronic anticoagulation   CKD (chronic kidney disease) stage 4, GFR 15-29 ml/min (HCC)   Chronic diastolic heart failure (HCC)   Dehydration   Chronic systolic CHF (congestive heart failure) (HCC)   SSS (sick sinus syndrome) ([place])   Delirium not superimposed on dementia   1. HCAP - 1. PNA pathway 2. Cultures pending 3. UA also pending 4. Cefepime and vanc 5. Trend lactate 2. Dehydration - Per Dr. Debara Pickett, patient slightly dehydrated 1. IVF at 75 cc/hr for gentle hydration 2. Holding  diuretics 3. Chronic CHF - 1. Holding diuretics as above 2. Watch for fluid overload, apparently patient has a narrow euvolemic window it seems 3. Tele monitor 4. DM2 - 1. Continue lantus 2. Sensitive scale SSI ac/hs 5. CKD stage 4 - appears baseline at this point 1. Intake and output 2. Repeat BMP in AM 6. 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 7. Chronic anticoagulation - continue 8. SSS - 1. Continue amiodarone and home meds 2. Pacemaker    Code Status: Full Code per son Family Communication: Son at bedside Disposition Plan: Admit to inpatient   Time spent: 70 min  Manette Doto M. [facility] Pager [phone]  If 7AM-7PM, please contact the day team taking care of the patient [email or web address] Password [employer] 04/24/2015, 7:55 PM

## 2015-05-12 NOTE — ED Notes (Signed)
Pt weaned from bipap machine with rapid response nurse Ben, placed on 2L [place], maintaining at 100% at this time.

## 2015-05-12 NOTE — ED Provider Notes (Signed)
CSN: [id number]     Arrival date & time 05/09/2015  1653 History   First MD Initiated Contact with Patient 04/30/2015 1702     Chief Complaint  Patient presents with  . Respiratory Distress   81 yo AAM w/PMH of HTN, HLD, CVA, CKD4, PAF on eliquis, DM2, SSS w/PM, dCHF (EF 55% in '14) and recent diagnosis of HCAP on levaquin who presents from [facility] (nursing facility) w/resp distress. Pt was found lying flat, breathing hard, and sating in the mid 80s.   (Consider location/radiation/quality/duration/timing/severity/associated sxs/prior Treatment) Patient is a 81 y.o. male presenting with shortness of breath.  Shortness of Breath Severity:  Moderate Onset quality:  Sudden Duration:  1 day Timing:  Constant Chronicity:  New Relieved by: bipap. Associated symptoms: no fever, no syncope and no vomiting     Past Medical History  Diagnosis Date  . Hyperlipidemia   . Spinal stenosis   . Anemia   . Carotid artery disease ([place])     a. s/p prior carotid endarterectomy.  [name] PVD (peripheral vascular disease) ([place])   . Hyponatremia     Chronic  . CVA (cerebral infarction)     a. When asked to clarify patient says he was told thumb numbness may be TIA. He does not recall formal stroke. CT head results are only available through [facility], not in [facility].  . CKD (chronic kidney disease) stage 4, GFR 15-29 ml/min (HCC)   . Long term (current) use of anticoagulants   . PAF (paroxysmal atrial fibrillation) ([place])     a. Asymptomatic. On Eliquis. CHADSVASC 7.  . Sick sinus syndrome ([place])     With DDD St. Jude PM, initially placed in 1993 by Dr. Nils Pyle. Device upgrade 10/2002 to DDD, by Dr. Rollene Fare complicated by bleeding. Subsequent gen change 2011.  [name] Hypertensive cardiovascular disease   . Chronic diastolic congestive heart failure ([place])     ECHO 05/20/12 LVEF estimated by 2D at 55-60%  . Arthritis   . Gait disorder   . Diabetic peripheral neuropathy associated with type 2 diabetes mellitus ([place])   .  Diabetes mellitus (HCC)     insulin dependent  . Elevated troponin I level 06/22/2014    a. 06/2014 felt due to demand ischemia.  . Chronic anticoagulation     For PAF, on Eliquis   . Urinary retention   . Pulmonary hypertension ([place])   . Foot drop, left 07/21/2014  . Asterixis 07/21/2014  . Hypertension   . Presence of permanent cardiac pacemaker   . Bilateral plantar fasciitis   . Pneumonia     hx of pneumonia x 1   . HCAP (healthcare-associated pneumonia) 04/28/2015   Past Surgical History  Procedure Laterality Date  . Lumbar laminectomy  1995  . Back surgery    . Total knee arthroplasty Left   . Quadriceps tendon repair    . Cataract extraction    . Carotid endarterectomy    . Yag laser application Right 5/68/1275    Procedure: YAG LASER CAPSULOTOMY OF RIGHT EYE;  Surgeon: [name], MD;  Location: [facility];  Service: Ophthalmology;  Laterality: Right;  . Tonsillectomy    . Pacemaker insertion      DDD St. Jude PM, initially placed in 1993 by Dr. Nils Pyle. Device upgrade 10/2002 to DDD, by Dr. Rollene Fare complicated by bleeding.  . Carotid endarterectomy Right 2006  . Transurethral resection of prostate    . Cardioversion N/A 06/16/2013    Procedure: CARDIOVERSION BEDSIDE;  Surgeon: [name], MD;  Location: [facility];  Service: Cardiovascular;  Laterality: N/A;  . Pacemaker generator change  12/09/2009    SJM Accent DR RF gen change by Dr [name]  . Insert / replace / remove pacemaker      2004   . Transurethral resection of prostate N/A 08/27/2014    Procedure: TRANSURETHRAL RESECTION OF THE PROSTATE WITH GYRUS INSTRUMENTS;  Surgeon: [name], MD;  Location: [facility];  Service: Urology;  Laterality: N/A;  . Cystoscopy N/A 08/27/2014    Procedure: CYSTOSCOPY;  Surgeon: [name], MD;  Location: [facility];  Service: Urology;  Laterality: N/A;   Family History  Problem Relation Age of Onset  . Multiple myeloma Father   . Hypertension Mother   . Healthy Sister   . COPD  Sister   . Heart failure Sister    Social History  Substance Use Topics  . Smoking status: Former Smoker -- 2 years    Types: Pipe, Cigarettes    Quit date: 05/14/1958  . Smokeless tobacco: Never Used  . Alcohol Use: Yes     Comment: Cocktails occasionally    Review of Systems  Unable to perform ROS: Severe respiratory distress  Constitutional: Negative for fever.  Respiratory: Positive for shortness of breath.   Cardiovascular: Negative for syncope.  Gastrointestinal: Negative for vomiting.      Allergies  Statins; Aggrenox; Carvedilol; Claritin; Cymbalta; Latanoprost; Lyrica; Mucinex; Other; Phenergan; Plavix; Rapaflo; and Travatan z  Home Medications   Prior to Admission medications   Medication Sig Start Date End Date Taking? Authorizing Provider  allopurinol (ZYLOPRIM) 100 MG tablet Take 100 mg by mouth 2 (two) times daily.    Yes Historical Provider, MD  apixaban (ELIQUIS) 2.5 MG TABS tablet Take 1 tablet (2.5 mg total) by mouth 2 (two) times daily. 12/27/14  Yes Belva Crome, MD  bethanechol (URECHOLINE) 25 MG tablet Take 25 mg by mouth 4 (four) times daily.   Yes Historical Provider, MD  Carboxymethylcellul-Glycerin 0.5-0.9 % SOLN Place 1 drop into both eyes 2 (two) times daily.    Yes Historical Provider, MD  ferrous sulfate 325 (65 FE) MG tablet Take 325 mg by mouth daily with breakfast.    Yes Historical Provider, MD  HYDROcodone-acetaminophen (NORCO/VICODIN) 5-325 MG tablet Take 1-2 tablets by mouth every 4 (four) hours as needed for moderate pain. 05/02/15  Yes Bonnielee Haff, MD  insulin aspart (NOVOLOG FLEXPEN) 100 UNIT/ML FlexPen Inject 9 Units into the skin 3 (three) times daily with meals. Per sliding scale:  CBG <175 no insulin (unless consuming large amounts of carbs; 6-10 units for CBG >175 based on actual CBG and number of carbs in meal; >325 call MD   Yes Historical Provider, MD  insulin glargine (LANTUS) 100 UNIT/ML injection Inject 0.12 mLs (12 Units  total) into the skin at bedtime. Or as directed by sliding scale. 05/02/15  Yes Bonnielee Haff, MD  isosorbide mononitrate (IMDUR) 60 MG 24 hr tablet Take 1 tablet (60 mg total) by mouth daily. 03/07/15  Yes Belva Crome, MD  megestrol (MEGACE) 400 MG/10ML suspension Take 5 mLs (200 mg total) by mouth daily. 05/02/15  Yes Bonnielee Haff, MD  Melatonin 5 MG CAPS Take 5 mg by mouth at bedtime as needed (sleep).   Yes Historical Provider, MD  metoprolol tartrate (LOPRESSOR) 25 MG tablet Take 1 tablet (25 mg total) by mouth daily. 02/28/15  Yes Belva Crome, MD  nitroGLYCERIN (NITROSTAT) 0.4 MG SL tablet  Place 1 tablet (0.4 mg total) under the tongue every 5 (five) minutes as needed for chest pain. 12/03/13  Yes Belva Crome, MD  ondansetron (ZOFRAN) 4 MG tablet Take 4 mg by mouth every 8 (eight) hours as needed for nausea or vomiting.   Yes Historical Provider, MD  polyethylene glycol (MIRALAX / GLYCOLAX) packet Take 17 g by mouth 2 (two) times daily.    Yes Historical Provider, MD  polyvinyl alcohol (LIQUIFILM TEARS) 1.4 % ophthalmic solution Place 1 drop into both eyes 2 (two) times daily. 04/26/15  Yes Maryann Mikhail, DO  potassium chloride SA (K-DUR,KLOR-CON) 20 MEQ tablet Take 1 tablet (20 mEq total) by mouth daily. 09/08/14  Yes Debbe Odea, MD  Tafluprost 0.0015 % SOLN Place 1 drop into both eyes at bedtime. ZIOPTAN   Yes Historical Provider, MD  tamsulosin (FLOMAX) 0.4 MG CAPS capsule Take 1 capsule (0.4 mg total) by mouth daily after supper. 04/19/15  Yes Janece Canterbury, MD  torsemide (DEMADEX) 20 MG tablet Take 3 tablets (60 mg total) by mouth daily. 05/02/15  Yes Bonnielee Haff, MD  tuberculin 5 UNIT/0.1ML injection Inject 0.1 mLs into the skin once.   Yes Historical Provider, MD  amiodarone (PACERONE) 200 MG tablet Take 1 tablet (200 mg total) by mouth at bedtime. 09/17/14   Belva Crome, MD  levofloxacin (LEVAQUIN) 500 MG tablet Take 1 tablet (500 mg total) by mouth every other day. For 5  more doses Patient not taking: Reported on 05/03/2015 05/02/15   Bonnielee Haff, MD  methocarbamol (ROBAXIN) 500 MG tablet Take 1 tablet (500 mg total) by mouth every 8 (eight) hours as needed for muscle spasms. Patient not taking: Reported on 04/22/2015 04/26/15   Maryann Mikhail, DO   BP 130/59 mmHg  Pulse 65  Temp(Src) 98.1 F (36.7 C)  Resp 20  SpO2 100% Physical Exam  Constitutional: He appears well-developed and well-nourished. No distress.  HENT:  Head: Normocephalic and atraumatic.  Eyes: Pupils are equal, round, and reactive to light.  Neck: Normal range of motion.  Cardiovascular: Normal rate, regular rhythm, normal heart sounds and intact distal pulses.  Exam reveals no gallop and no friction rub.   No murmur heard. Pulmonary/Chest: Effort normal and breath sounds normal. No respiratory distress. He has no wheezes. He has no rales. He exhibits no tenderness.  Mildly decreased air movement. Bpap in place.   Abdominal: Soft. Bowel sounds are normal. He exhibits no distension and no mass. There is no tenderness. There is no rebound and no guarding.  Musculoskeletal: Normal range of motion.  Lymphadenopathy:    He has no cervical adenopathy.  Neurological: He is alert.  Skin: Skin is warm and dry. He is not diaphoretic.  Nursing note and vitals reviewed.   ED Course  Procedures (including critical care time) Angiocath insertion Performed by: Sherian Maroon  Consent: Verbal consent obtained. Risks and benefits: risks, benefits and alternatives were discussed Time out: Immediately prior to procedure a "time out" was called to verify the correct patient, procedure, equipment, support staff and site/side marked as required.  Preparation: Patient was prepped and draped in the usual sterile fashion.  Vein Location: right AC fossa  Ultrasound Guided  Gauge: 20  Normal blood return and flush without difficulty Patient tolerance: Patient tolerated the procedure well with no  immediate complications.     Labs Review Labs Reviewed  CBC WITH DIFFERENTIAL/PLATELET - Abnormal; Notable for the following:    RBC 3.46 (*)    Hemoglobin  11.6 (*)    HCT 34.2 (*)    RDW 16.9 (*)    All other components within normal limits  COMPREHENSIVE METABOLIC PANEL - Abnormal; Notable for the following:    Sodium 130 (*)    Potassium 5.4 (*)    Chloride 97 (*)    Glucose, Bld 306 (*)    BUN 40 (*)    Creatinine, Ser 3.11 (*)    Total Protein 6.3 (*)    Albumin 2.7 (*)    AST 80 (*)    ALT 77 (*)    GFR calc non Af Amer 16 (*)    GFR calc Af Amer 18 (*)    All other components within normal limits  BRAIN NATRIURETIC PEPTIDE - Abnormal; Notable for the following:    B Natriuretic Peptide 625.3 (*)    All other components within normal limits  PROTIME-INR - Abnormal; Notable for the following:    Prothrombin Time 17.4 (*)    All other components within normal limits  I-STAT CG4 LACTIC ACID, ED - Abnormal; Notable for the following:    Lactic Acid, Venous 3.04 (*)    All other components within normal limits  I-STAT ARTERIAL BLOOD GAS, ED - Abnormal; Notable for the following:    pH, Arterial 7.456 (*)    pCO2 arterial 33.3 (*)    All other components within normal limits  I-STAT TROPOININ, ED - Abnormal; Notable for the following:    Troponin i, poc 0.36 (*)    All other components within normal limits  CULTURE, BLOOD (ROUTINE X 2)  CULTURE, BLOOD (ROUTINE X 2)  APTT    Imaging Review Dg Chest Portable 1 View  05/13/2015  CLINICAL DATA:  Dyspnea for 1 day. EXAM: PORTABLE CHEST 1 VIEW COMPARISON:  04/28/2015. FINDINGS: Borderline enlarged cardiac silhouette with a mild decrease in size. The interstitial markings remain prominent. Interval ill-defined opacity at both lung bases, greater on the right. Stable left subclavian pacemaker leads. Thoracic spine degenerative changes. IMPRESSION: 1. Bibasilar atelectasis or pneumonia. 2. Small right pleural effusion and  possible small left pleural effusion. Electronically Signed   By: Claudie Revering M.D.   On: 04/27/2015 17:40   I have personally reviewed and evaluated these images and lab results as part of my medical decision-making.   EKG Interpretation   Date/Time:  Thursday May 12 2015 17:15:21 EST Ventricular Rate:  81 PR Interval:  188 QRS Duration: 107 QT Interval:  435 QTC Calculation: 505 R Axis:   -6 Text Interpretation:  Atrial-paced complexes Ventricular premature complex  Low voltage, extremity leads Nonspecific T abnormalities, lateral leads No  significant change since last tracing Confirmed by YAO  MD, DAVID ([id number])  on 04/19/2015 5:31:42 PM      MDM   Final diagnoses:  Acute on chronic systolic congestive heart failure (HCC)  Lactic acidosis  HCAP (healthcare-associated pneumonia)   81 yo M who presents w/hypoxia/resp distress. See HPI for details. Placed on BPAP and WOB appears normal.  CXR c/w CHF exacerbation. Ordered lasix, but before given lactic acid resulted >3. Cr rising, now 3.1 (prior 2.5). Given V/Z for HCAP. Discussed w/cards. Difficult to assess his true fluid status: No JVD, no LE edema, but CXR and resp status more c/w CHF exac w/fluid overload. Given lactic acid, will hold off on diuresis for now.    Admit to step-down/hospitalist for cont'd tx of HCAP and monitoring of fluid status and resp status.    Pt was seen under  the supervision of Dr. Darl Householder.     Sherian Maroon, MD 05/04/2015 1931  Wandra Arthurs, MD 04/28/2015 2001

## 2015-05-12 NOTE — ED Notes (Signed)
Gardner, DO at bedside.  

## 2015-05-12 NOTE — ED Notes (Signed)
[facility]- pt transported from [facility]. EMS called out for resp distress. Pt has hx of CHF. Placed on cpap. Pt vital signs stable, diminished lung sounds.

## 2015-05-12 NOTE — Consult Note (Signed)
CONSULTATION NOTE  Reason for Consult: Dyspnea  Requesting Physician: Dr. Darl Householder  Cardiologist: [name]  HPI: This is a 81 y.o. male with a past medical history significant for chronic diastolic HF, St Jude pacemaker, PAF, stage IV CKD, HTN, DM2, Recently admitted with chest pain and found to have HCAP. He responded to IV antibiotics and then switched to Levaquin. His troponin was mildly elevated, however, no further testing was planned. Volume management was challenging and he was felt to be contracted - he was given fluids, but ultimately had diuretics resumed at a lower dose. He has CKD3. He was discharged to [facility].  Today he was noted to be in respiratory distress, found lying flat with O2 saturations in the 80's per the resident report. Cardiology is asked to evaluate and recommend options for management of pulmonary edema / diastolic congestive heart failure. I reviewed his CXR which shows small pleural effusions as well as bibasilar atelatasis or pneumonia (similar to CXR findings in 12/15). Creatinine is elevated at 3.11. BNP is abnormal at 625, which was 470 on 12/15. Troponin is 0.36, unchanged compared to prior stable troponin elevations. Venous lactate is high at 3.04. Na 130, Cl 97, K 5.4. Glucose is 306.  PMHx:  Past Medical History  Diagnosis Date  . Hyperlipidemia   . Spinal stenosis   . Anemia   . Carotid artery disease ([place])     a. s/p prior carotid endarterectomy.  [name] PVD (peripheral vascular disease) ([place])   . Hyponatremia     Chronic  . CVA (cerebral infarction)     a. When asked to clarify patient says he was told thumb numbness may be TIA. He does not recall formal stroke. CT head results are only available through [facility], not in [facility].  . CKD (chronic kidney disease) stage 4, GFR 15-29 ml/min (HCC)   . Long term (current) use of anticoagulants   . PAF (paroxysmal atrial fibrillation) ([place])     a. Asymptomatic. On Eliquis. CHADSVASC 7.  . Sick sinus  syndrome ([place])     With DDD St. Jude PM, initially placed in 1993 by Dr. Nils Pyle. Device upgrade 10/2002 to DDD, by Dr. Rollene Fare complicated by bleeding. Subsequent gen change 2011.  [name] Hypertensive cardiovascular disease   . Chronic diastolic congestive heart failure ([place])     ECHO 05/20/12 LVEF estimated by 2D at 55-60%  . Arthritis   . Gait disorder   . Diabetic peripheral neuropathy associated with type 2 diabetes mellitus ([place])   . Diabetes mellitus (HCC)     insulin dependent  . Elevated troponin I level 06/22/2014    a. 06/2014 felt due to demand ischemia.  . Chronic anticoagulation     For PAF, on Eliquis   . Urinary retention   . Pulmonary hypertension ([place])   . Foot drop, left 07/21/2014  . Asterixis 07/21/2014  . Hypertension   . Presence of permanent cardiac pacemaker   . Bilateral plantar fasciitis   . Pneumonia     hx of pneumonia x 1   . HCAP (healthcare-associated pneumonia) 04/28/2015   Past Surgical History  Procedure Laterality Date  . Lumbar laminectomy  1995  . Back surgery    . Total knee arthroplasty Left   . Quadriceps tendon repair    . Cataract extraction    . Carotid endarterectomy    . Yag laser application Right 07/05/9796    Procedure: YAG LASER CAPSULOTOMY OF RIGHT EYE;  Surgeon: Gean Birchwood  Brooke Bonito., MD;  Location: [facility];  Service: Ophthalmology;  Laterality: Right;  . Tonsillectomy    . Pacemaker insertion      DDD St. Jude PM, initially placed in 1993 by Dr. Nils Pyle. Device upgrade 10/2002 to DDD, by Dr. Rollene Fare complicated by bleeding.  . Carotid endarterectomy Right 2006  . Transurethral resection of prostate    . Cardioversion N/A 06/16/2013    Procedure: CARDIOVERSION BEDSIDE;  Surgeon: [name], MD;  Location: [facility];  Service: Cardiovascular;  Laterality: N/A;  . Pacemaker generator change  12/09/2009    SJM Accent DR RF gen change by Dr [name]  . Insert / replace / remove pacemaker      2004   . Transurethral resection of prostate  N/A 08/27/2014    Procedure: TRANSURETHRAL RESECTION OF THE PROSTATE WITH GYRUS INSTRUMENTS;  Surgeon: [name], MD;  Location: [facility];  Service: Urology;  Laterality: N/A;  . Cystoscopy N/A 08/27/2014    Procedure: CYSTOSCOPY;  Surgeon: [name], MD;  Location: [facility];  Service: Urology;  Laterality: N/A;    FAMHx: Family History  Problem Relation Age of Onset  . Multiple myeloma Father   . Hypertension Mother   . Healthy Sister   . COPD Sister   . Heart failure Sister     SOCHx:  reports that he quit smoking about 57 years ago. His smoking use included Pipe and Cigarettes. He quit after 2 years of use. He has never used smokeless tobacco. He reports that he drinks alcohol. He reports that he does not use illicit drugs.  ALLERGIES: Allergies  Allergen Reactions  . Statins Other (See Comments)    rhabdomyolisis  . Aggrenox [Aspirin-Dipyridamole Er] Other (See Comments)    Dizziness  . Carvedilol Other (See Comments)    Dizziness  . Claritin [Loratadine] Other (See Comments)    Dizziness & drowsiness   . Cymbalta [Duloxetine Hcl]     Confusion   . Latanoprost Itching    Watery   . Lyrica [Pregabalin] Other (See Comments)    Makes him dizzy. Takes him days to recover.  . Mucinex [Guaifenesin Er] Other (See Comments)    Dizziness, drowsiness  . Other Other (See Comments)    Preservatives in glaucoma gtts cause eye irritation, has to use preservative free  . Phenergan [Promethazine Hcl] Other (See Comments)    Causes severe confusion  . Plavix [Clopidogrel Bisulfate] Other (See Comments)    Dizziness  . Rapaflo [Silodosin] Other (See Comments)    Dizziness for about 30- 45  Minutes At time of preop appointment on 08/26/2014 patient denies any problems with this medication  . Travatan Z [Travoprost] Other (See Comments)    Eye irritation    ROS: Review of systems not obtained due to patient factors.  HOME MEDICATIONS:   Medication List    ASK your doctor about  these medications        allopurinol 100 MG tablet  Commonly known as:  ZYLOPRIM  Take 100 mg by mouth 2 (two) times daily.     amiodarone 200 MG tablet  Commonly known as:  PACERONE  Take 1 tablet (200 mg total) by mouth at bedtime.     apixaban 2.5 MG Tabs tablet  Commonly known as:  ELIQUIS  Take 1 tablet (2.5 mg total) by mouth 2 (two) times daily.     bethanechol 25 MG tablet  Commonly known as:  URECHOLINE  Take 25 mg by mouth 4 (four) times  daily.     Carboxymethylcellul-Glycerin 0.5-0.9 % Soln  Place 1 drop into both eyes 2 (two) times daily.     ferrous sulfate 325 (65 FE) MG tablet  Take 325 mg by mouth daily with breakfast.     HYDROcodone-acetaminophen 5-325 MG tablet  Commonly known as:  NORCO/VICODIN  Take 1-2 tablets by mouth every 4 (four) hours as needed for moderate pain.     insulin glargine 100 UNIT/ML injection  Commonly known as:  LANTUS  Inject 0.12 mLs (12 Units total) into the skin at bedtime. Or as directed by sliding scale.     isosorbide mononitrate 60 MG 24 hr tablet  Commonly known as:  IMDUR  Take 1 tablet (60 mg total) by mouth daily.     levofloxacin 500 MG tablet  Commonly known as:  LEVAQUIN  Take 1 tablet (500 mg total) by mouth every other day. For 5 more doses     megestrol 400 MG/10ML suspension  Commonly known as:  MEGACE  Take 5 mLs (200 mg total) by mouth daily.     Melatonin 5 MG Caps  Take 5 mg by mouth at bedtime as needed (sleep).     methocarbamol 500 MG tablet  Commonly known as:  ROBAXIN  Take 1 tablet (500 mg total) by mouth every 8 (eight) hours as needed for muscle spasms.     metoprolol tartrate 25 MG tablet  Commonly known as:  LOPRESSOR  Take 1 tablet (25 mg total) by mouth daily.     nitroGLYCERIN 0.4 MG SL tablet  Commonly known as:  NITROSTAT  Place 1 tablet (0.4 mg total) under the tongue every 5 (five) minutes as needed for chest pain.     NOVOLOG FLEXPEN 100 UNIT/ML FlexPen  Generic drug:  insulin  aspart  Inject 9 Units into the skin 3 (three) times daily with meals. Per sliding scale:  CBG <175 no insulin (unless consuming large amounts of carbs; 6-10 units for CBG >175 based on actual CBG and number of carbs in meal; >325 call MD     ondansetron 4 MG tablet  Commonly known as:  ZOFRAN  Take 4 mg by mouth every 8 (eight) hours as needed for nausea or vomiting.     polyethylene glycol packet  Commonly known as:  MIRALAX / GLYCOLAX  Take 17 g by mouth 2 (two) times daily.     polyvinyl alcohol 1.4 % ophthalmic solution  Commonly known as:  LIQUIFILM TEARS  Place 1 drop into both eyes 2 (two) times daily.     potassium chloride SA 20 MEQ tablet  Commonly known as:  K-DUR,KLOR-CON  Take 1 tablet (20 mEq total) by mouth daily.     Tafluprost 0.0015 % Soln  Place 1 drop into both eyes at bedtime. ZIOPTAN     tamsulosin 0.4 MG Caps capsule  Commonly known as:  FLOMAX  Take 1 capsule (0.4 mg total) by mouth daily after supper.     torsemide 20 MG tablet  Commonly known as:  DEMADEX  Take 3 tablets (60 mg total) by mouth daily.     tuberculin 5 UNIT/0.1ML injection  Inject 0.1 mLs into the skin once.        HOSPITAL MEDICATIONS: I have reviewed the patient's current medications.  VITALS: Blood pressure 130/59, pulse 65, temperature 98.1 F (36.7 C), resp. rate 20, SpO2 100 %.  PHYSICAL EXAM: General appearance: alert, no distress and mildly confused Neck: no carotid bruit and no JVD  Lungs: diminished breath sounds bibasilar and rales bibasilar Heart: regular rate and rhythm Abdomen: soft, non-tender; bowel sounds normal; no masses,  no organomegaly Extremities: extremities normal, atraumatic, no cyanosis or edema Pulses: 2+ and symmetric Skin: Skin color, texture, turgor normal. No rashes or lesions Neurologic: Mental status: Alert, oriented to self, but not oriented to place Psych: Pleasant  LABS: Results for orders placed or performed during the hospital  encounter of 05/10/2015 (from the past 48 hour(s))  I-stat troponin, ED     Status: Abnormal   Collection Time: 05/05/2015  5:18 PM  Result Value Ref Range   Troponin i, poc 0.36 (HH) 0.00 - 0.08 ng/mL   Comment 3            Comment: Due to the release kinetics of cTnI, a negative result within the first hours of the onset of symptoms does not rule out myocardial infarction with certainty. If myocardial infarction is still suspected, repeat the test at appropriate intervals.   CBC with Differential     Status: Abnormal   Collection Time: 05/07/2015  5:19 PM  Result Value Ref Range   WBC 7.3 4.0 - 10.5 K/uL   RBC 3.46 (L) 4.22 - 5.81 MIL/uL   Hemoglobin 11.6 (L) 13.0 - 17.0 g/dL   HCT 34.2 (L) 39.0 - 52.0 %   MCV 98.8 78.0 - 100.0 fL   MCH 33.5 26.0 - 34.0 pg   MCHC 33.9 30.0 - 36.0 g/dL   RDW 16.9 (H) 11.5 - 15.5 %   Platelets 184 150 - 400 K/uL   Neutrophils Relative % 78 %   Neutro Abs 5.7 1.7 - 7.7 K/uL   Lymphocytes Relative 14 %   Lymphs Abs 1.0 0.7 - 4.0 K/uL   Monocytes Relative 8 %   Monocytes Absolute 0.6 0.1 - 1.0 K/uL   Eosinophils Relative 0 %   Eosinophils Absolute 0.0 0.0 - 0.7 K/uL   Basophils Relative 0 %   Basophils Absolute 0.0 0.0 - 0.1 K/uL  Comprehensive metabolic panel     Status: Abnormal   Collection Time: 05/08/2015  5:19 PM  Result Value Ref Range   Sodium 130 (L) 135 - 145 mmol/L   Potassium 5.4 (H) 3.5 - 5.1 mmol/L   Chloride 97 (L) 101 - 111 mmol/L   CO2 22 22 - 32 mmol/L   Glucose, Bld 306 (H) 65 - 99 mg/dL   BUN 40 (H) 6 - 20 mg/dL   Creatinine, Ser 3.11 (H) 0.61 - 1.24 mg/dL   Calcium 9.0 8.9 - 10.3 mg/dL   Total Protein 6.3 (L) 6.5 - 8.1 g/dL   Albumin 2.7 (L) 3.5 - 5.0 g/dL   AST 80 (H) 15 - 41 U/L   ALT 77 (H) 17 - 63 U/L   Alkaline Phosphatase 65 38 - 126 U/L   Total Bilirubin 1.1 0.3 - 1.2 mg/dL   GFR calc non Af Amer 16 (L) >60 mL/min   GFR calc Af Amer 18 (L) >60 mL/min    Comment: (NOTE) The eGFR has been calculated using the CKD  EPI equation. This calculation has not been validated in all clinical situations. eGFR's persistently <60 mL/min signify possible Chronic Kidney Disease.    Anion gap 11 5 - 15  Brain natriuretic peptide     Status: Abnormal   Collection Time: 05/06/2015  5:19 PM  Result Value Ref Range   B Natriuretic Peptide 625.3 (H) 0.0 - 100.0 pg/mL  Protime-INR  Status: Abnormal   Collection Time: 05/10/2015  5:19 PM  Result Value Ref Range   Prothrombin Time 17.4 (H) 11.6 - 15.2 seconds   INR 1.42 0.00 - 1.49  APTT     Status: None   Collection Time: 04/18/2015  5:19 PM  Result Value Ref Range   aPTT 37 24 - 37 seconds    Comment:        IF BASELINE aPTT IS ELEVATED, SUGGEST PATIENT RISK ASSESSMENT BE USED TO DETERMINE APPROPRIATE ANTICOAGULANT THERAPY.   I-Stat arterial blood gas, ED     Status: Abnormal   Collection Time: 04/22/2015  5:23 PM  Result Value Ref Range   pH, Arterial 7.456 (H) 7.350 - 7.450   pCO2 arterial 33.3 (L) 35.0 - 45.0 mmHg   pO2, Arterial 85.0 80.0 - 100.0 mmHg   Bicarbonate 23.5 20.0 - 24.0 mEq/L   TCO2 24 0 - 100 mmol/L   O2 Saturation 97.0 %   Patient temperature 98.6 F    Collection site RADIAL, [name]'S TEST ACCEPTABLE    Drawn by Operator    Sample type ARTERIAL   I-Stat CG4 Lactic Acid, ED     Status: Abnormal   Collection Time: 05/04/2015  5:50 PM  Result Value Ref Range   Lactic Acid, Venous 3.04 (HH) 0.5 - 2.0 mmol/L   Comment NOTIFIED PHYSICIAN     IMAGING: Dg Chest Portable 1 View  05/03/2015  CLINICAL DATA:  Dyspnea for 1 day. EXAM: PORTABLE CHEST 1 VIEW COMPARISON:  04/28/2015. FINDINGS: Borderline enlarged cardiac silhouette with a mild decrease in size. The interstitial markings remain prominent. Interval ill-defined opacity at both lung bases, greater on the right. Stable left subclavian pacemaker leads. Thoracic spine degenerative changes. IMPRESSION: 1. Bibasilar atelectasis or pneumonia. 2. Small right pleural effusion and possible small left  pleural effusion. Electronically Signed   By: Claudie Revering M.D.   On: 05/14/2015 17:40    HOSPITAL DIAGNOSES: Principal Problem:   Dehydration Active Problems:   Chronic diastolic heart failure (HCC)   Acute renal failure superimposed on stage 3 chronic kidney disease (HCC)   IMPRESSION: 1. Dehydration 2. Acute renal failure on CKD3 3. Hyponatremia/hypochloremia 4. Hyperkalemia   RECOMMENDATION: 1. 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 He was found to be hypoxic today. CXR suggested pleural effusions and pneumonia, but appears similar to findings 2 weeks ago. BNP mildly elevated, however, sodium and chloride are low, there is no peripheral edema and he has a severely dry tongue. JVP is not elevated on exam. I feel findings are more consistent with dehydration. There may be infection - his urine is dark and should be studied as he has had recent foley catheter exchanges for urinary retention. This could explain the elevated lactate. Will defer to medicine service. Would recommend volume repletion overnight - re-assess labs in the morning.  Cardiology will follow with you. Thanks for the consultation.  Time Spent Directly with Patient: 45 minutes  Pixie Casino, MD, [facility] Attending Cardiologist [place] 05/08/2015, 7:27 PM

## 2015-05-12 NOTE — Consult Note (Addendum)
ANTIBIOTIC CONSULT NOTE - INITIAL  Pharmacy Consult for Vancomycin and Zosyn-->change to Cefepime Indication: pneumonia  Allergies  Allergen Reactions  . Statins Other (See Comments)    rhabdomyolisis  . Aggrenox [Aspirin-Dipyridamole Er] Other (See Comments)    Dizziness  . Carvedilol Other (See Comments)    Dizziness  . Claritin [Loratadine] Other (See Comments)    Dizziness & drowsiness   . Cymbalta [Duloxetine Hcl]     Confusion   . Latanoprost Itching    Watery   . Lyrica [Pregabalin] Other (See Comments)    Makes him dizzy. Takes him days to recover.  . Mucinex [Guaifenesin Er] Other (See Comments)    Dizziness, drowsiness  . Other Other (See Comments)    Preservatives in glaucoma gtts cause eye irritation, has to use preservative free  . Phenergan [Promethazine Hcl] Other (See Comments)    Causes severe confusion  . Plavix [Clopidogrel Bisulfate] Other (See Comments)    Dizziness  . Rapaflo [Silodosin] Other (See Comments)    Dizziness for about 30- 45  Minutes At time of preop appointment on 08/26/2014 patient denies any problems with this medication  . Travatan Z [Travoprost] Other (See Comments)    Eye irritation    Patient Measurements:  Weight 84.9kg Height 5'7''  Vital Signs: BP: 121/58 mmHg (12/29 1716) Pulse Rate: 59 (12/29 1716) Intake/Output from previous day:   Intake/Output from this shift:    Labs:  Recent Labs  05/09/2015 1719  WBC 7.3  HGB 11.6*  PLT 184  CREATININE 3.11*   Estimated Creatinine Clearance: 15.1 mL/min (by C-G formula based on Cr of 3.11).  Microbiology: Recent Results (from the past 720 hour(s))  Blood culture (routine x 2)     Status: None   Collection Time: 04/28/15  4:25 AM  Result Value Ref Range Status   Specimen Description BLOOD RIGHT HAND  Final   Special Requests AEROBIC BOTTLE ONLY 5ML  Final   Culture NO GROWTH 5 DAYS  Final   Report Status 05/03/2015 FINAL  Final  Blood culture (routine x 2)      Status: None   Collection Time: 04/28/15  4:27 AM  Result Value Ref Range Status   Specimen Description BLOOD LEFT HAND  Final   Special Requests BOTTLES DRAWN AEROBIC AND ANAEROBIC 5ML  Final   Culture NO GROWTH 5 DAYS  Final   Report Status 05/03/2015 FINAL  Final  Urine culture     Status: None   Collection Time: 04/28/15  6:11 AM  Result Value Ref Range Status   Specimen Description URINE, RANDOM  Final   Special Requests NONE  Final   Culture NO GROWTH 1 DAY  Final   Report Status 04/29/2015 FINAL  Final    Medical History: Past Medical History  Diagnosis Date  . Hyperlipidemia   . Spinal stenosis   . Anemia   . Carotid artery disease ([place])     a. s/p prior carotid endarterectomy.  [name] PVD (peripheral vascular disease) ([place])   . Hyponatremia     Chronic  . CVA (cerebral infarction)     a. When asked to clarify patient says he was told thumb numbness may be TIA. He does not recall formal stroke. CT head results are only available through [facility], not in [facility].  . CKD (chronic kidney disease) stage 4, GFR 15-29 ml/min (HCC)   . Long term (current) use of anticoagulants   . PAF (paroxysmal atrial fibrillation) ([place])     a.  Asymptomatic. On Eliquis. CHADSVASC 7.  . Sick sinus syndrome ([place])     With DDD St. Jude PM, initially placed in 1993 by Dr. Nils Pyle. Device upgrade 10/2002 to DDD, by Dr. Rollene Fare complicated by bleeding. Subsequent gen change 2011.  [name] Hypertensive cardiovascular disease   . Chronic diastolic congestive heart failure ([place])     ECHO 05/20/12 LVEF estimated by 2D at 55-60%  . Arthritis   . Gait disorder   . Diabetic peripheral neuropathy associated with type 2 diabetes mellitus ([place])   . Diabetes mellitus (HCC)     insulin dependent  . Elevated troponin I level 06/22/2014    a. 06/2014 felt due to demand ischemia.  . Chronic anticoagulation     For PAF, on Eliquis   . Urinary retention   . Pulmonary hypertension ([place])   . Foot drop, left 07/21/2014  .  Asterixis 07/21/2014  . Hypertension   . Presence of permanent cardiac pacemaker   . Bilateral plantar fasciitis   . Pneumonia     hx of pneumonia x 1   . HCAP (healthcare-associated pneumonia) 04/28/2015   Assessment: 89yom presents to the ED from [place] with respiratory distress on cpap. CXR shows bibasilar atelectasis or pneumonia. He will begin vancomycin and zosyn. He has CKD but is in acute renal failure with sCr 3.11 and CrCl 32ml/min.  Goal of Therapy:  Vancomycin trough level 15-20 mcg/ml  Plan:  1) Vancomycin 1000mg  IV q48 2) Zosyn 2.25g IV q6 3) Follow renal function, cultures, LOT, level if needed  Deboraha Sprang 04/18/2015,6:12 PM  Addendum: Pharmacy consulted to change zosyn to cefepime. Will dose cefepime 1g IV q24.  Deboraha Sprang 05/13/2015, 8:14 PM

## 2015-05-13 DIAGNOSIS — I5042 Chronic combined systolic (congestive) and diastolic (congestive) heart failure: Secondary | ICD-10-CM

## 2015-05-13 DIAGNOSIS — J189 Pneumonia, unspecified organism: Secondary | ICD-10-CM

## 2015-05-13 LAB — BASIC METABOLIC PANEL
Anion gap: 11 (ref 5–15)
BUN: 38 mg/dL — ABNORMAL HIGH (ref 6–20)
CHLORIDE: 100 mmol/L — AB (ref 101–111)
CO2: 20 mmol/L — AB (ref 22–32)
CREATININE: 2.92 mg/dL — AB (ref 0.61–1.24)
Calcium: 8.9 mg/dL (ref 8.9–10.3)
GFR calc non Af Amer: 17 mL/min — ABNORMAL LOW (ref >60)
GFR, EST AFRICAN AMERICAN: 20 mL/min — AB (ref >60)
Glucose, Bld: 288 mg/dL — ABNORMAL HIGH (ref 65–99)
POTASSIUM: 5.1 mmol/L (ref 3.5–5.1)
SODIUM: 131 mmol/L — AB (ref 135–145)

## 2015-05-13 LAB — GLUCOSE, CAPILLARY
GLUCOSE-CAPILLARY: 200 mg/dL — AB (ref 65–99)
GLUCOSE-CAPILLARY: 208 mg/dL — AB (ref 65–99)
Glucose-Capillary: 107 mg/dL — ABNORMAL HIGH (ref 65–99)

## 2015-05-13 LAB — CBC
HEMATOCRIT: 32.2 % — AB (ref 39.0–52.0)
HEMOGLOBIN: 10.9 g/dL — AB (ref 13.0–17.0)
MCH: 33.5 pg (ref 26.0–34.0)
MCHC: 33.9 g/dL (ref 30.0–36.0)
MCV: 99.1 fL (ref 78.0–100.0)
Platelets: 191 10*3/uL (ref 150–400)
RBC: 3.25 MIL/uL — AB (ref 4.22–5.81)
RDW: 16.6 % — ABNORMAL HIGH (ref 11.5–15.5)
WBC: 8.6 10*3/uL (ref 4.0–10.5)

## 2015-05-13 LAB — LACTIC ACID, PLASMA
LACTIC ACID, VENOUS: 2.6 mmol/L — AB (ref 0.5–2.0)
LACTIC ACID, VENOUS: 1.9 mmol/L (ref 0.5–2.0)

## 2015-05-13 MED ORDER — BUDESONIDE 0.25 MG/2ML IN SUSP
0.2500 mg | Freq: Two times a day (BID) | RESPIRATORY_TRACT | Status: DC
  Administered 2015-05-13 – 2015-05-19 (×13): 0.25 mg via RESPIRATORY_TRACT
  Filled 2015-05-13 (×13): qty 2

## 2015-05-13 MED ORDER — SODIUM CHLORIDE 0.9 % IV BOLUS (SEPSIS)
500.0000 mL | Freq: Once | INTRAVENOUS | Status: AC
  Administered 2015-05-13: 500 mL via INTRAVENOUS

## 2015-05-13 MED ORDER — RAMELTEON 8 MG PO TABS
8.0000 mg | ORAL_TABLET | Freq: Every day | ORAL | Status: DC
  Administered 2015-05-13 – 2015-05-18 (×6): 8 mg via ORAL
  Filled 2015-05-13 (×10): qty 1

## 2015-05-13 NOTE — Progress Notes (Signed)
Results for THEO, FLEECE, MD (MRN [id number]) as of 05/13/2015 13:28  Ref. Range 04/19/2015 21:36 05/13/2015 07:34 05/13/2015 11:53  Glucose-Capillary Latest Ref Range: 65-99 mg/dL 292 (H) 200 (H) 208 (H)  Noted that CBGs since last night have been greater than 200 mg/dl.  Recommend increasing Novolog correction scale to MODERATE TID if blood sugars continue to be elevated.  Will continue to follow while in hospital. Harvel Ricks RN BSN CDE

## 2015-05-13 NOTE — NC FL2 (Signed)
[place] LEVEL OF CARE SCREENING TOOL     IDENTIFICATION  Patient Name: Darrell Mulder, [name] Birthdate: [birth year] Sex: male Admission Date (Current Location): 04/26/2015  [facility] and [place] Number:  Herbalist and Address:  [facility] [place] [address] [place], [place] [address]      Provider Number: [id number]  Attending Physician Name and Address:  Barton Dubois, [name]  Relative Name and Phone Number:  [place], [place] [phone]    Current Level of Care: Hospital Recommended Level of Care: [place] Prior Approval Number:    Date Approved/Denied:   PASRR Number: [id number]  Discharge Plan: SNF    Current Diagnoses: Patient Active Problem List   Diagnosis Date Noted  . Delirium not superimposed on dementia 04/18/2015  . Failure to thrive in adult   . Pressure ulcer 04/29/2015  . HCAP (healthcare-associated pneumonia) 04/28/2015  . Chest pain 04/28/2015  . Elevated troponin 04/28/2015  . Pain in the chest   . Osteoarthritis of hip 04/25/2015  . Abnormality of gait 04/21/2015  . Back pain 04/20/2015  . Severe comorbid illness 04/20/2015  . Gait abnormality 04/20/2015  . Low back pain   . Lactic acidosis 04/18/2015  . Chronic systolic CHF (congestive heart failure) ([place]) 04/18/2015  . Constipation 04/18/2015  . SSS (sick sinus syndrome) ([place]) 04/18/2015  . Bilateral plantar fasciitis 03/01/2015  . Bilateral leg and foot pain 02/27/2015  . Foot pain, bilateral 02/27/2015  . Dehydration 10/04/2014  . Vertigo 10/04/2014  . Urinary retention due to benign prostatic hyperplasia 08/27/2014  . Foot drop, left 07/21/2014  . Leg weakness 07/16/2014  . Chronic diastolic heart failure ([place]) 04/16/2014  . Essential hypertension   . Gait disorder 03/31/2014  . Solitary pulmonary nodule 10/09/2013  . CKD (chronic kidney disease) stage 4, GFR 15-29 ml/min (HCC)   . Long term current use of amiodarone  06/25/2013  . Chronic anticoagulation 04/03/2013    Class: Chronic  . Pacemaker - St Jude 04/03/2013    Class: Chronic  . Paroxysmal atrial fibrillation, DCCV 06/16/13 08/22/2012    Class: Chronic  . Hypertensive cardiovascular disease   . Type 2 diabetes mellitus with peripheral neuropathy (HCC)     Orientation RESPIRATION BLADDER Height & Weight    Self, Time, Situation, Place  Normal Continent, External catheter (Urinary catheter) 5\' 4"  (162.6 cm) 185 lbs.  BEHAVIORAL SYMPTOMS/MOOD NEUROLOGICAL BOWEL NUTRITION STATUS   (N/A)  (N/A) Incontinent  (Please see discharge summary)  AMBULATORY STATUS COMMUNICATION OF NEEDS Skin   Extensive Assist Verbally PU Stage and Appropriate Care (Stage II on sacrum; PU on heel; PU on toe;)                       Personal Care Assistance Level of Assistance  Bathing, Feeding, Dressing Bathing Assistance: Maximum assistance Feeding assistance: Independent Dressing Assistance: Maximum assistance     Functional Limitations Info  Sight, Hearing Sight Info: Impaired Hearing Info: Adequate (Hearing Aide)      Seg[place]  PT (By licensed PT), OT (By licensed OT)     PT Frequency: 5 OT Frequency: 5            Contractures      Additional Factors Info  Code Status, Allergies Code Status Info: Full Allergies Info: Statins, Aggrenox, Carvedilol, Claritin, Cymbalta, Latanoprost, Lyrica, Mucinex, Other, Phenergan, Plavix, Rapaflo, Travatan Z   Insulin Sliding Scale Info: insulin aspart (novoLOG)  injection 0-9 Units 3x/day; insulin glargine (LANTUS) injection 12 Units daily at bedtime;       Current Medications (05/13/2015):  This is the current hospital active medication list Current Facility-Administered Medications  Medication Dose Route Frequency Provider Last Rate Last Dose  . 0.9 %  sodium chloride infusion   Intravenous Continuous Barton Dubois, [name] 20 mL/hr at 05/13/15 [phone]    . allopurinol (ZYLOPRIM)  tablet 100 mg  100 mg Oral BID Etta Quill, DO   100 mg at 05/13/15 [id number]  . amiodarone (PACERONE) tablet 200 mg  200 mg Oral QHS Etta Quill, DO   200 mg at 05/13/2015 2245  . apixaban (ELIQUIS) tablet 2.5 mg  2.5 mg Oral BID Etta Quill, DO   2.5 mg at 05/13/15 [id number]  . bethanechol (URECHOLINE) tablet 25 mg  25 mg Oral QID Etta Quill, DO   25 mg at 05/13/15 0935  . budesonide (PULMICORT) nebulizer solution 0.25 mg  0.25 mg Nebulization BID Barton Dubois, [name]   0.25 mg at 05/13/15 0916  . ceFEPIme (MAXIPIME) 1 g in dextrose 5 % 50 mL IVPB  1 g Intravenous Q24H Etta Quill, DO   1 g at 05/10/2015 2300  . ferrous sulfate tablet 325 mg  325 mg Oral Q breakfast Etta Quill, DO   325 mg at 05/13/15 [id number]  . HYDROcodone-acetaminophen (NORCO/VICODIN) 5-325 MG per tablet 1-2 tablet  1-2 tablet Oral Q4H PRN Etta Quill, DO      . insulin aspart (novoLOG) injection 0-9 Units  0-9 Units Subcutaneous TID WC Etta Quill, DO   1 Units at 05/13/15 [id number]  . insulin glargine (LANTUS) injection 12 Units  12 Units Subcutaneous QHS Etta Quill, DO   12 Units at 05/05/2015 2300  . isosorbide mononitrate (IMDUR) 24 hr tablet 60 mg  60 mg Oral Daily Etta Quill, DO   60 mg at 05/13/15 0935  . latanoprost (XALATAN) 0.005 % ophthalmic solution 1 drop  1 drop Both Eyes QHS Etta Quill, DO   1 drop at 04/25/2015 2301  . megestrol (MEGACE) 400 MG/10ML suspension 200 mg  200 mg Oral Daily Etta Quill, DO   200 mg at 05/13/15 [id number]  . metoprolol tartrate (LOPRESSOR) tablet 25 mg  25 mg Oral Daily Etta Quill, DO   25 mg at 05/13/15 [id number]  . ondansetron (ZOFRAN) tablet 4 mg  4 mg Oral Q8H PRN Etta Quill, DO      . polyethylene glycol (MIRALAX / GLYCOLAX) packet 17 g  17 g Oral BID Etta Quill, DO   17 g at 04/22/2015 2200  . polyvinyl alcohol (LIQUIFILM TEARS) 1.4 % ophthalmic solution 1 drop  1 drop Both Eyes BID Etta Quill, DO   1 drop at 05/13/15 1000  . ramelteon (ROZEREM)  tablet 8 mg  8 mg Oral QHS Rhetta Mura Schorr, NP   8 mg at 05/13/15 0245  . tamsulosin (FLOMAX) capsule 0.4 mg  0.4 mg Oral QPC supper Etta Quill, DO   0.4 mg at 04/27/2015 2246  . vancomycin (VANCOCIN) IVPB 1000 mg/200 mL premix  1,000 mg Intravenous Q48H Otilio Miu, RPH 200 mL/hr at 04/26/2015 1957 1,000 mg at 04/23/2015 [id number]     Discharge Medications: Please see discharge summary for a list of discharge medications.  Relevant Imaging Results:  Relevant Lab Results:   Additional Information Social Security #: [id number]  Benard Halsted, [place]

## 2015-05-13 NOTE — Progress Notes (Signed)
Utilization Review Completed.  

## 2015-05-13 NOTE — Progress Notes (Signed)
Pt's 1800 meds given - computer in room "broke" just as med packages were empty and thrown away. Locked up... Rebooted and scanning proof was lost.

## 2015-05-13 NOTE — Progress Notes (Addendum)
SUBJECTIVE:  Breathing better.  Urine looks more clear.  OBJECTIVE:   Vitals:   Filed Vitals:   05/13/15 0028 05/13/15 0426 05/13/15 0700 05/13/15 0916  BP: 117/50 99/57 124/61   Pulse: 66 60  64  Temp: 98.4 F (36.9 C) 98.2 F (36.8 C) 98.2 F (36.8 C)   TempSrc: Axillary Oral Oral   Resp: 17 19 21 28   Height:      Weight:      SpO2: 94% 97%  96%   I&O's:   Intake/Output Summary (Last 24 hours) at 05/13/15 1156 Last data filed at 05/13/15 0600  Gross per 24 hour  Intake  717.5 ml  Output    975 ml  Net -257.5 ml   TELEMETRY: Reviewed telemetry pt in atrial paced:     PHYSICAL EXAM General: Well developed, well nourished, in no acute distress Head:   Normal cephalic and atramatic  Lungs:   Coarse breath sounds bilaterally to auscultation. Heart:   HRRR S1 S2  No JVD.   Abdomen: abdomen soft and non-tender Msk:  Back normal,  Normal strength and tone for age. Extremities:   No edema.   Neuro: Alert and oriented. Psych:  Normal affect, responds appropriately Skin: No rash   LABS: Basic Metabolic Panel:  Recent Labs  05/01/2015 1719 05/13/15 0240  NA 130* 131*  K 5.4* 5.1  CL 97* 100*  CO2 22 20*  GLUCOSE 306* 288*  BUN 40* 38*  CREATININE 3.11* 2.92*  CALCIUM 9.0 8.9   Liver Function Tests:  Recent Labs  05/10/2015 1719  AST 80*  ALT 77*  ALKPHOS 65  BILITOT 1.1  PROT 6.3*  ALBUMIN 2.7*   No results for input(s): LIPASE, AMYLASE in the last 72 hours. CBC:  Recent Labs  05/11/2015 1719 05/13/15 0240  WBC 7.3 8.6  NEUTROABS 5.7  --   HGB 11.6* 10.9*  HCT 34.2* 32.2*  MCV 98.8 99.1  PLT 184 191   Cardiac Enzymes: No results for input(s): CKTOTAL, CKMB, CKMBINDEX, TROPONINI in the last 72 hours. BNP: Invalid input(s): POCBNP D-Dimer: No results for input(s): DDIMER in the last 72 hours. Hemoglobin A1C: No results for input(s): HGBA1C in the last 72 hours. Fasting Lipid Panel: No results for input(s): CHOL, HDL, LDLCALC, TRIG,  CHOLHDL, LDLDIRECT in the last 72 hours. Thyroid Function Tests: No results for input(s): TSH, T4TOTAL, T3FREE, THYROIDAB in the last 72 hours.  Invalid input(s): FREET3 Anemia Panel: No results for input(s): VITAMINB12, FOLATE, FERRITIN, TIBC, IRON, RETICCTPCT in the last 72 hours. Coag Panel:   Lab Results  Component Value Date   INR 1.42 05/13/2015   INR 1.22 04/18/2015   INR 1.33 06/22/2014    RADIOLOGY: Ct Abdomen Pelvis Wo Contrast  04/18/2015  EXAM: CT ABDOMEN AND PELVIS WITHOUT CONTRAST TECHNIQUE: Multidetector CT imaging of the abdomen and pelvis was performed following the standard protocol without IV contrast. COMPARISON:  Abdominal ultrasound 07/17/2014 FINDINGS: Small left pleural effusion and trace right effusion. No confluent opacity in the visualized lung bases. Heart is normal size. Liver appears small. No focal hepatic abnormality. Spleen, pancreas, right adrenal, kidneys have an unremarkable unenhanced appearance. 2.2 cm nodule in the left adrenal gland has a density of 35 Hounsfield units, nonspecific. No renal or ureteral stones. No hydronephrosis. There is prominence of the prostate. The inferior bladder wall is thickened, suggesting possible bladder outlet obstruction. Aorta and iliac vessels are heavily calcified, non aneurysmal. Small left inguinal hernia containing fat. There is no  free fluid, free air or adenopathy. Stomach, large and small bowel are unremarkable. IMPRESSION: No renal or ureteral stones.  No hydronephrosis. Prominence of the prostate with inferior bladder wall thickening, possibly related to bladder outlet obstruction. Left inguinal hernia containing fat. Small left pleural effusion and trace right pleural effusion. Electronically Signed   By: Rolm Baptise M.D.   On: 04/18/2015 11:21   Dg Chest 2 View  04/28/2015  CLINICAL DATA:  Bilateral chest tightness. EXAM: CHEST  2 VIEW COMPARISON:  03/11/2015 FINDINGS: Small bilateral pleural effusions similar to  previous studies. Focal airspace disease in the right lung base posteriorly suggests focal pneumonia. Left lung is clear. No pneumothorax. Normal heart size and pulmonary vascularity. Cardiac pacemaker. Calcification of the aorta. Degenerative changes in the spine. IMPRESSION: Focal airspace disease in the right lung base suggesting pneumonia. Small bilateral pleural effusions. Electronically Signed   By: [name] M.D.   On: 04/28/2015 03:35   Ct Lumbar Spine Wo Contrast  04/20/2015  CLINICAL DATA:  Back pain with BILATERAL leg weakness which began after being moved on to x-ray table 2 days ago. EXAM: CT LUMBAR SPINE WITHOUT CONTRAST TECHNIQUE: Multidetector CT imaging of the lumbar spine was performed without intravenous contrast administration. Multiplanar CT image reconstructions were also generated. COMPARISON:  Lumbar radiographs 02/28/2015. CT abdomen and pelvis 04/18/2015. FINDINGS: The patient has undergone previous lumbar fusion. Solid arthrodesis at L2-3 anteriorly. Previous posterior decompression from L2 through L5 with some regrowth of posterior elements. Moderate facet arthropathy throughout. Trace anterolisthesis L4-5, otherwise anatomic alignment. Vacuum phenomenon at L5-S1 is chronic. No acute compression fracture or posttraumatic subluxation. Exuberant endplate reactive changes with osseous spurring. Benign-appearing Schmorl's node L5-S1 on the RIGHT. Shallow calcific protrusion L1-L2. Atherosclerosis. Paravertebral soft tissues unchanged from priors with stable LEFT adrenal nodule. IMPRESSION: No posttraumatic sequelae or concern for spinal infection. Postsurgical changes as described. Electronically Signed   By: Staci Righter M.D.   On: 04/20/2015 10:46   Dg Chest Portable 1 View  04/22/2015  CLINICAL DATA:  Dyspnea for 1 day. EXAM: PORTABLE CHEST 1 VIEW COMPARISON:  04/28/2015. FINDINGS: Borderline enlarged cardiac silhouette with a mild decrease in size. The interstitial markings  remain prominent. Interval ill-defined opacity at both lung bases, greater on the right. Stable left subclavian pacemaker leads. Thoracic spine degenerative changes. IMPRESSION: 1. Bibasilar atelectasis or pneumonia. 2. Small right pleural effusion and possible small left pleural effusion. Electronically Signed   By: Claudie Revering M.D.   On: 04/25/2015 17:40   Dg Fluoro Guide Ndl Plcd/bx/inj/loc  04/25/2015  CLINICAL DATA:  Right hip pain. EXAM: RIGHT HIP INJECTION UNDER FLUOROSCOPY FLUOROSCOPY TIME:  Fluoroscopy Time (in minutes and seconds): 1 MINUTE 48 SECONDS PROCEDURE: Informed consent was obtained from Dr. Ellsworth Lennox and his accompanying friend. Dr. Ellsworth Lennox reported that his friend has power of attorney for him. Overlying skin prepped with Betadine, draped in the usual sterile fashion, and infiltrated locally with buffered Lidocaine. Curved 20 gauge spinal needle advanced to the superolateral margin of the right femoral head. 1 ml of Lidocaine injected easily. Diagnostic injection of iodinated contrast demonstrates intra-articular spread without intravascular component. 120mg  Depo-Medrol and 5 ml Sensorcaine 0.5% were then administered. No immediate complication. IMPRESSION: Technically successful right hip injection under fluoroscopy. Dr. Kennon Holter tolerated the procedure well. Electronically Signed   By: Lorriane Shire M.D.   On: 04/25/2015 13:02      ASSESSMENT: / PLAN:   1) Appears euvolemic.  BNP chronically elevated.  Breathing comfortably.  2) CRI: Improved today.  3)Aortic stenosis with chonic combined systolic and diastolic heart failure.- stable.    4) ABx for Pneumonia per the primary team.    Urine appears to have cleared up with some IV fluid. Treated for dehydration.  Jettie Booze, MD  05/13/2015  11:56 AM

## 2015-05-13 NOTE — Progress Notes (Signed)
Pt became very confused and agitated, trying to get up and pull out lines. Mitt applied and MD notified.

## 2015-05-13 NOTE — Progress Notes (Signed)
Pt has been alert and oriented to self today.  pts family at bedside most of shift. Pt able to tolerate full/soft diet with feeding assistance. incont of Bowel x2. Foley catheter draining clear amber urine.

## 2015-05-13 NOTE — Progress Notes (Signed)
CRITICAL VALUE ALERT  Critical value received:  Lactic acid 2.6  Date of notification:  05/13/2015   Time of notification:  0016  Critical value read back:Yes.    Nurse who received alert:  Reap, Jon Gills   MD notified (1st page):  TRH  Time of first page:  12:17 AM   MD notified (2nd page):  Time of second page:  Responding MD:    Time MD responded:

## 2015-05-13 NOTE — Progress Notes (Signed)
[facility] PROGRESS NOTE  Darrell Mulder, MD [id number] DOB: [birth year] DOA: 04/23/2015 PCP: [name], MD  Assessment/Plan: Healthcare associated pneumonia Patient with episode of hypoxia at SNF Will continue IV antibiotics Will start flutter valve and pulmicort CXR to be repeated in am No elevation on WBC's and he is afebrile. Will follow clinical response   Low back pain Appears to be chronic. Recently seen by neurosurgery and was placed on steroids patient at [facility] for rehabilitation.  Chronic systolic and diastolic congestive heart failure Last measured EF was about 35-40%.  Thought to be volume depleted by cardiology and given IV fluids.  Appears euvolemic now Will continue daily weights, low sodium diet and strict intake and output Will hold diuretics for another 24 hours and will follow cardiology rec's  Chronic kidney disease stage III and hyperkalemia Creatinine has improved with hydration.  Cr down to 2.92 and potassium 5.1 will monitor tren continue holding diuretics; but will advance diet and switch IVF's to KVORepeat blood work next week.  History of sick sinus syndrome, status post pacemaker HR stable; will monitor on telemetry   History of paroxysmal atrial fibrillation HR is well controlled Cardiology is on board and will follow their recommendations Will continue amiodarone, metoprolol and anticoagulation with Apixaban.  Chronic anemia Stable.  Will Continue to monitor.  History of gout Will continue allopurinol No joint swelling appreciated  Diabetes mellitus type 2 with chronic kidney disease Continue Lantus with sliding scale coverage.  CBGs are stable overall.  History of obstructive uropathy with Foley catheter Continue foley Continue flomax Outpatient follow up with urology   Code Status: Full Family Communication: daughter and caregiver at bedside  Disposition Plan: will remain in stepdown for another 24 hours; will  switch IVF's to [facility], continue IV abx's; holding diuretics for another day. Will advance diet and follow CXR in am   Consultants:  Cardiology   Procedures:  See below for x-ray reports   Antibiotics:  Cefepime 12/29  Vancomycin 12/29  HPI/Subjective: Afebrile, no CP; still with work of breathing and with some agitation/confusion overnight. euvolemic on exam  Objective: Filed Vitals:   05/13/15 0426 05/13/15 0700  BP: 99/57 124/61  Pulse: 60   Temp: 98.2 F (36.8 C) 98.2 F (36.8 C)  Resp: 19 21    Intake/Output Summary (Last 24 hours) at 05/13/15 0906 Last data filed at 05/13/15 0600  Gross per 24 hour  Intake  717.5 ml  Output    975 ml  Net -257.5 ml   Filed Weights   05/08/2015 2104  Weight: 84 kg (185 lb 3 oz)    Exam:   General:  Afebrile, confuse/agitated overnight; no CP and with some increase work of breathing (unable to speak in full sentences)  Cardiovascular: rate controlled, no JVD, no rubs or gallops  Respiratory: scattered rhonchi and decrease BS at bases; positive wheezing   Abdomen: soft, NT, ND, positive BS  Musculoskeletal: no edema, no cyanosis   Data Reviewed: Basic Metabolic Panel:  Recent Labs Lab 04/21/2015 1719 05/13/15 0240  NA 130* 131*  K 5.4* 5.1  CL 97* 100*  CO2 22 20*  GLUCOSE 306* 288*  BUN 40* 38*  CREATININE 3.11* 2.92*  CALCIUM 9.0 8.9   Liver Function Tests:  Recent Labs Lab 04/27/2015 1719  AST 80*  ALT 77*  ALKPHOS 65  BILITOT 1.1  PROT 6.3*  ALBUMIN 2.7*   CBC:  Recent Labs Lab 04/15/2015 1719 05/13/15 0240  WBC 7.3 8.6  NEUTROABS 5.7  --   HGB 11.6* 10.9*  HCT 34.2* 32.2*  MCV 98.8 99.1  PLT 184 191   BNP (last 3 results)  Recent Labs  02/27/15 0924 04/28/15 0324 04/17/2015 1719  BNP 325.4* 470.1* 625.3*    ProBNP (last 3 results)  Recent Labs  07/16/14 1213  PROBNP 460.0*    CBG:  Recent Labs Lab 04/19/2015 2136 05/13/15 0734  GLUCAP 292* 200*    Recent Results (from  the past 240 hour(s))  MRSA PCR Screening     Status: None   Collection Time: 04/21/2015  9:00 PM  Result Value Ref Range Status   MRSA by PCR NEGATIVE NEGATIVE Final    Comment:        The GeneXpert MRSA Assay (FDA approved for NASAL specimens only), is one component of a comprehensive MRSA colonization surveillance program. It is not intended to diagnose MRSA infection nor to guide or monitor treatment for MRSA infections.      Studies: Dg Chest Portable 1 View  04/28/2015  CLINICAL DATA:  Dyspnea for 1 day. EXAM: PORTABLE CHEST 1 VIEW COMPARISON:  04/28/2015. FINDINGS: Borderline enlarged cardiac silhouette with a mild decrease in size. The interstitial markings remain prominent. Interval ill-defined opacity at both lung bases, greater on the right. Stable left subclavian pacemaker leads. Thoracic spine degenerative changes. IMPRESSION: 1. Bibasilar atelectasis or pneumonia. 2. Small right pleural effusion and possible small left pleural effusion. Electronically Signed   By: Claudie Revering M.D.   On: 05/03/2015 17:40    Scheduled Meds: . allopurinol  100 mg Oral BID  . amiodarone  200 mg Oral QHS  . apixaban  2.5 mg Oral BID  . bethanechol  25 mg Oral QID  . budesonide (PULMICORT) nebulizer solution  0.25 mg Nebulization BID  . ceFEPime (MAXIPIME) IV  1 g Intravenous Q24H  . ferrous sulfate  325 mg Oral Q breakfast  . insulin aspart  0-9 Units Subcutaneous TID WC  . insulin glargine  12 Units Subcutaneous QHS  . isosorbide mononitrate  60 mg Oral Daily  . latanoprost  1 drop Both Eyes QHS  . megestrol  200 mg Oral Daily  . metoprolol tartrate  25 mg Oral Daily  . polyethylene glycol  17 g Oral BID  . polyvinyl alcohol  1 drop Both Eyes BID  . ramelteon  8 mg Oral QHS  . tamsulosin  0.4 mg Oral QPC supper  . vancomycin  1,000 mg Intravenous Q48H   Continuous Infusions: . sodium chloride 75 mL/hr at 05/13/15 [id number]    Principal Problem:   HCAP (healthcare-associated  pneumonia) Active Problems:   Type 2 diabetes mellitus with peripheral neuropathy (HCC)   Chronic anticoagulation   CKD (chronic kidney disease) stage 4, GFR 15-29 ml/min (HCC)   Chronic diastolic heart failure (HCC)   Dehydration   Chronic systolic CHF (congestive heart failure) (HCC)   SSS (sick sinus syndrome) ([place])   Delirium not superimposed on dementia    Time spent: 35 minutes    Barton Dubois  [facility] Pager [phone]. If 7PM-7AM, please contact night-coverage at [email or web address], password [facility] 05/13/2015, 9:06 AM  LOS: 1 day

## 2015-05-14 ENCOUNTER — Inpatient Hospital Stay (HOSPITAL_COMMUNITY): Payer: Medicare Other

## 2015-05-14 DIAGNOSIS — E872 Acidosis: Secondary | ICD-10-CM

## 2015-05-14 DIAGNOSIS — M1000 Idiopathic gout, unspecified site: Secondary | ICD-10-CM | POA: Insufficient documentation

## 2015-05-14 LAB — C DIFFICILE QUICK SCREEN W PCR REFLEX
C DIFFICILE (CDIFF) INTERP: NEGATIVE
C DIFFICILE (CDIFF) TOXIN: NEGATIVE
C DIFFICLE (CDIFF) ANTIGEN: NEGATIVE

## 2015-05-14 LAB — CBC
HCT: 49.5 % (ref 39.0–52.0)
Hemoglobin: 16.2 g/dL (ref 13.0–17.0)
MCH: 31.5 pg (ref 26.0–34.0)
MCHC: 32.7 g/dL (ref 30.0–36.0)
MCV: 96.1 fL (ref 78.0–100.0)
PLATELETS: 126 10*3/uL — AB (ref 150–400)
RBC: 5.15 MIL/uL (ref 4.22–5.81)
RDW: 16.5 % — ABNORMAL HIGH (ref 11.5–15.5)
WBC: 9.5 10*3/uL (ref 4.0–10.5)

## 2015-05-14 LAB — GLUCOSE, CAPILLARY
GLUCOSE-CAPILLARY: 130 mg/dL — AB (ref 65–99)
GLUCOSE-CAPILLARY: 309 mg/dL — AB (ref 65–99)
Glucose-Capillary: 116 mg/dL — ABNORMAL HIGH (ref 65–99)
Glucose-Capillary: 220 mg/dL — ABNORMAL HIGH (ref 65–99)
Glucose-Capillary: 196 mg/dL — ABNORMAL HIGH (ref 65–99)

## 2015-05-14 LAB — HIV ANTIBODY (ROUTINE TESTING W REFLEX): HIV Screen 4th Generation wRfx: NONREACTIVE

## 2015-05-14 MED ORDER — ALBUTEROL SULFATE (2.5 MG/3ML) 0.083% IN NEBU
2.5000 mg | INHALATION_SOLUTION | Freq: Once | RESPIRATORY_TRACT | Status: AC
  Administered 2015-05-14: 2.5 mg via RESPIRATORY_TRACT

## 2015-05-14 MED ORDER — POLYETHYLENE GLYCOL 3350 17 G PO PACK
17.0000 g | PACK | Freq: Every day | ORAL | Status: DC | PRN
  Filled 2015-05-14: qty 1

## 2015-05-14 MED ORDER — ACETAMINOPHEN 325 MG PO TABS
650.0000 mg | ORAL_TABLET | Freq: Four times a day (QID) | ORAL | Status: DC | PRN
Start: 2015-05-14 — End: 2015-05-19
  Administered 2015-05-14: 650 mg via ORAL
  Filled 2015-05-14: qty 2

## 2015-05-14 MED ORDER — ALBUTEROL SULFATE (2.5 MG/3ML) 0.083% IN NEBU
INHALATION_SOLUTION | RESPIRATORY_TRACT | Status: AC
  Administered 2015-05-14: 2.5 mg via RESPIRATORY_TRACT
  Filled 2015-05-14: qty 3

## 2015-05-14 MED ORDER — FUROSEMIDE 10 MG/ML IJ SOLN
80.0000 mg | Freq: Once | INTRAMUSCULAR | Status: AC
  Administered 2015-05-14: 80 mg via INTRAVENOUS
  Filled 2015-05-14: qty 8

## 2015-05-14 MED ORDER — ALBUTEROL SULFATE (2.5 MG/3ML) 0.083% IN NEBU
2.5000 mg | INHALATION_SOLUTION | RESPIRATORY_TRACT | Status: DC | PRN
  Administered 2015-05-15 – 2015-05-20 (×4): 2.5 mg via RESPIRATORY_TRACT
  Filled 2015-05-14 (×5): qty 3

## 2015-05-14 NOTE — Progress Notes (Signed)
Notified by primary RN that [name] was using accessory respiratory muscles. Rapid Assessment Team assessed him and administered a nebulizer. Stat Chest x-ray obtain revealing bilateral pleural effusions and pulmonary edema, nebulizer and 80 mg IV lasix ordered and now given. Vitals reviewed; BP 123/72 mmHg  Pulse 64  Temp(Src) 98.1 F (36.7 C) (Oral)  Resp 26  Ht 5\' 7"  (1.702 m)  Wt 84 kg (185 lb 3 oz)  BMI 29.00 kg/m2  SpO2 100%. He is pleasant, still tachypneic, lungs moving some air but scattered wheezing and bibasilar rales with decreased BS at bases. JVP to midneck with +HJR. Regular rhythm. Agree with nebulizer PRN. 80 mg IV lasix hopefully will help with elevated filling pressures. He may need BIPAP later. Discussed plan with primary RN, patient and caregiver at bedside.  Jules Husbands, MD

## 2015-05-14 NOTE — Significant Event (Signed)
Rapid Response Event Note Called by primary RN to see pt for increased WOB Overview: Time Called: 2036 Arrival Time: 2038 Event Type: Respiratory  Initial Focused Assessment:  On arrival to the room the pt was sitting up in bed, RR 26, Sats 96% RA with increased WOB, using accessory muscles. BBS diminished with virtually no air movement in the bases and upper airway wheeze.  Albuterol neb tx given (per RR protocol) with good results.  BBS after tx with better air movement still diminished t/o R>L & fine crackles on Lt.  Pt states he feels much better and is more alert and talkative.  Primary RN spoke with cards on call and orders rcvd for PCXR and lasix. Will cont. To monitor and assist as needed. Interventions: Albuterol 2.5mg  IH  Event Summary:   at      at    Outcome: Stayed in room and stabalized     Shaquela Weichert Hedgecock

## 2015-05-14 NOTE — Progress Notes (Signed)
SUBJECTIVE:  Breathing better.  Urine looks more clear. Denies Chest pain or SOB.  OBJECTIVE:   Vitals:   Filed Vitals:   05/14/15 0000 05/14/15 0421 05/14/15 0746 05/14/15 0956  BP: 121/65 140/69  105/63  Pulse: 69 77  67  Temp: 98.2 F (36.8 C) 98 F (36.7 C)  97.9 F (36.6 C)  TempSrc: Oral Oral  Oral  Resp: 23 16  25   Height:      Weight:      SpO2: 92% 96% 96% 99%   I&O's:    Intake/Output Summary (Last 24 hours) at 05/14/15 1052 Last data filed at 05/14/15 0300  Gross per 24 hour  Intake 699.58 ml  Output    275 ml  Net 424.58 ml   TELEMETRY: Reviewed telemetry pt in atrial paced:     PHYSICAL EXAM General: Well developed, well nourished, in no acute distress Head:   Normal cephalic and atramatic  Lungs:   Coarse breath sounds bilaterally to auscultation. Heart:   HRRR S1 S2  No JVD.   Abdomen: abdomen soft and non-tender Msk:  Back normal,  Normal strength and tone for age. Extremities:   No edema.   Neuro: Alert and oriented. Psych:  Normal affect, responds appropriately Skin: No rash   LABS: Basic Metabolic Panel:  Recent Labs  04/27/2015 1719 05/13/15 0240  NA 130* 131*  K 5.4* 5.1  CL 97* 100*  CO2 22 20*  GLUCOSE 306* 288*  BUN 40* 38*  CREATININE 3.11* 2.92*  CALCIUM 9.0 8.9   Liver Function Tests:  Recent Labs  05/13/2015 1719  AST 80*  ALT 77*  ALKPHOS 65  BILITOT 1.1  PROT 6.3*  ALBUMIN 2.7*   No results for input(s): LIPASE, AMYLASE in the last 72 hours. CBC:  Recent Labs  04/28/2015 1719 05/13/15 0240 05/14/15 0600  WBC 7.3 8.6 9.5  NEUTROABS 5.7  --   --   HGB 11.6* 10.9* 16.2  HCT 34.2* 32.2* 49.5  MCV 98.8 99.1 96.1  PLT 184 191 126*   Coag Panel:   Lab Results  Component Value Date   INR 1.42 04/22/2015   INR 1.22 04/18/2015   INR 1.33 06/22/2014    RADIOLOGY: Ct Abdomen Pelvis Wo Contrast  04/18/2015  EXAM: CT ABDOMEN AND PELVIS WITHOUT CONTRAST TECHNIQUE: Multidetector CT imaging of the abdomen and  pelvis was performed following the standard protocol without IV contrast. COMPARISON:  Abdominal ultrasound 07/17/2014 FINDINGS: Small left pleural effusion and trace right effusion. No confluent opacity in the visualized lung bases. Heart is normal size. Liver appears small. No focal hepatic abnormality. Spleen, pancreas, right adrenal, kidneys have an unremarkable unenhanced appearance. 2.2 cm nodule in the left adrenal gland has a density of 35 Hounsfield units, nonspecific. No renal or ureteral stones. No hydronephrosis. There is prominence of the prostate. The inferior bladder wall is thickened, suggesting possible bladder outlet obstruction. Aorta and iliac vessels are heavily calcified, non aneurysmal. Small left inguinal hernia containing fat. There is no free fluid, free air or adenopathy. Stomach, large and small bowel are unremarkable. IMPRESSION: No renal or ureteral stones.  No hydronephrosis. Prominence of the prostate with inferior bladder wall thickening, possibly related to bladder outlet obstruction. Left inguinal hernia containing fat. Small left pleural effusion and trace right pleural effusion. Electronically Signed   By: Rolm Baptise M.D.   On: 04/18/2015 11:21   Dg Chest 2 View  05/14/2015  CLINICAL DATA:  Shortness of breath. History of  hypertension and congestive heart failure. EXAM: CHEST  2 VIEW COMPARISON:  04/27/2015 FINDINGS: Dual lead pacer noted. Mild enlargement of the cardiopericardial silhouette, with bilateral indistinct perihilar and basilar airspace opacities favoring airspace edema. Indistinct pulmonary vasculature. Moderate bilateral pleural effusions. Thoracic spondylosis noted. IMPRESSION: 1. Mild enlargement of the cardiopericardial silhouette with increase in pulmonary edema, and moderate bilateral pleural effusions. Electronically Signed   By: Van Clines M.D.   On: 05/14/2015 08:41   Dg Chest 2 View  04/28/2015  CLINICAL DATA:  Bilateral chest tightness.  EXAM: CHEST  2 VIEW COMPARISON:  03/11/2015 FINDINGS: Small bilateral pleural effusions similar to previous studies. Focal airspace disease in the right lung base posteriorly suggests focal pneumonia. Left lung is clear. No pneumothorax. Normal heart size and pulmonary vascularity. Cardiac pacemaker. Calcification of the aorta. Degenerative changes in the spine. IMPRESSION: Focal airspace disease in the right lung base suggesting pneumonia. Small bilateral pleural effusions. Electronically Signed   By: [name] M.D.   On: 04/28/2015 03:35   Ct Lumbar Spine Wo Contrast  04/20/2015  CLINICAL DATA:  Back pain with BILATERAL leg weakness which began after being moved on to x-ray table 2 days ago. EXAM: CT LUMBAR SPINE WITHOUT CONTRAST TECHNIQUE: Multidetector CT imaging of the lumbar spine was performed without intravenous contrast administration. Multiplanar CT image reconstructions were also generated. COMPARISON:  Lumbar radiographs 02/28/2015. CT abdomen and pelvis 04/18/2015. FINDINGS: The patient has undergone previous lumbar fusion. Solid arthrodesis at L2-3 anteriorly. Previous posterior decompression from L2 through L5 with some regrowth of posterior elements. Moderate facet arthropathy throughout. Trace anterolisthesis L4-5, otherwise anatomic alignment. Vacuum phenomenon at L5-S1 is chronic. No acute compression fracture or posttraumatic subluxation. Exuberant endplate reactive changes with osseous spurring. Benign-appearing Schmorl's node L5-S1 on the RIGHT. Shallow calcific protrusion L1-L2. Atherosclerosis. Paravertebral soft tissues unchanged from priors with stable LEFT adrenal nodule. IMPRESSION: No posttraumatic sequelae or concern for spinal infection. Postsurgical changes as described. Electronically Signed   By: Staci Righter M.D.   On: 04/20/2015 10:46   Dg Chest Portable 1 View  05/06/2015  CLINICAL DATA:  Dyspnea for 1 day. EXAM: PORTABLE CHEST 1 VIEW COMPARISON:  04/28/2015.  FINDINGS: Borderline enlarged cardiac silhouette with a mild decrease in size. The interstitial markings remain prominent. Interval ill-defined opacity at both lung bases, greater on the right. Stable left subclavian pacemaker leads. Thoracic spine degenerative changes. IMPRESSION: 1. Bibasilar atelectasis or pneumonia. 2. Small right pleural effusion and possible small left pleural effusion. Electronically Signed   By: Claudie Revering M.D.   On: 04/18/2015 17:40   Dg Fluoro Guide Ndl Plcd/bx/inj/loc  04/25/2015  CLINICAL DATA:  Right hip pain. EXAM: RIGHT HIP INJECTION UNDER FLUOROSCOPY FLUOROSCOPY TIME:  Fluoroscopy Time (in minutes and seconds): 1 MINUTE 48 SECONDS PROCEDURE: Informed consent was obtained from Dr. Ellsworth Lennox and his accompanying friend. Dr. Ellsworth Lennox reported that his friend has power of attorney for him. Overlying skin prepped with Betadine, draped in the usual sterile fashion, and infiltrated locally with buffered Lidocaine. Curved 20 gauge spinal needle advanced to the superolateral margin of the right femoral head. 1 ml of Lidocaine injected easily. Diagnostic injection of iodinated contrast demonstrates intra-articular spread without intravascular component. 120mg  Depo-Medrol and 5 ml Sensorcaine 0.5% were then administered. No immediate complication. IMPRESSION: Technically successful right hip injection under fluoroscopy. Dr. Kennon Holter tolerated the procedure well. Electronically Signed   By: Lorriane Shire M.D.   On: 04/25/2015 13:02      ASSESSMENT: / PLAN:  1) Appears euvolemic.  BNP chronically elevated.  Breathing comfortably.    2) CRI: Improved today.  3)Aortic stenosis with chonic combined systolic and diastolic heart failure.- stable.    4) ABx for Pneumonia per the primary team.    Urine appears to have cleared up with some IV fluid. Treated for dehydration.  [name], MD  05/14/2015  10:52 AM

## 2015-05-14 NOTE — Progress Notes (Signed)
[facility] PROGRESS NOTE  [name], MD [id number] DOB: [birth year] DOA: 04/23/2015 PCP: [name], MD  Assessment/Plan: Healthcare associated pneumonia/SOB Patient with episode of hypoxia at SNF Will continue IV antibiotics for another 24 hours Will continue working with flutter valve and pulmicort; no more wheezing  appreciated CXR to be repeated on Monday No elevation on WBC's and he has remained afebrile. Will follow cardiology rec's and attempt diuretics challenge on 05/15/15 Will follow clinical response   Low back pain Appears to be chronic. Recently seen by neurosurgery and was placed on steroids patient at [facility] for rehabilitation.  Chronic systolic and diastolic congestive heart failure Last measured EF was about 35-40%.  Thought to be volume depleted by cardiology and given IV fluids.  Appears euvolemic now Will continue daily weights, low sodium diet and strict intake and output Will hold diuretics for another 24 hours and will follow cardiology rec's  Chronic kidney disease stage III and hyperkalemia Creatinine has improved with hydration.  Last Cr 2.92, will monitor trend continue holdding diuretics for today; will need diuretics challenge tomorrow and repeat x-ray on Monday to follow vascular congestion en effusion    History of sick sinus syndrome, status post pacemaker HR stable; will monitor on telemetry   History of paroxysmal atrial fibrillation HR is well controlled Cardiology is on board and will follow their recommendations Will continue amiodarone, metoprolol and anticoagulation with Apixaban.  Chronic anemia Stable.  Will Continue to monitor.  History of gout Will continue allopurinol No joint swelling appreciated  Diabetes mellitus type 2 with chronic kidney disease Continue Lantus with sliding scale coverage.  CBGs are stable overall.  History of obstructive uropathy with Foley catheter Continue foley Continue  flomax Outpatient follow up with urology   Code Status: Full Family Communication: daughter and caregiver at bedside  Disposition Plan: will remain in stepdown for another 24 hours; will switch IVF's to [facility], continue IV abx's; holding diuretics for another day. Will advance diet and follow CXR in am   Consultants:  Cardiology   Procedures:  See below for x-ray reports   Antibiotics:  Cefepime 12/29  Vancomycin 12/29  HPI/Subjective: Afebrile, no CP; still with difficulty speaking on full sentences, requiring oxygen and with orthopnea. Repeated CXR with pulmonary vascular congestion and moderate pleural effusion.  Objective: Filed Vitals:   05/14/15 0421 05/14/15 0956  BP: 140/69 105/63  Pulse: 77 67  Temp: 98 F (36.7 C) 97.9 F (36.6 C)  Resp: 16 25    Intake/Output Summary (Last 24 hours) at 05/14/15 1346 Last data filed at 05/14/15 1100  Gross per 24 hour  Intake 859.58 ml  Output    276 ml  Net 583.58 ml   Filed Weights   04/25/2015 2104  Weight: 84 kg (185 lb 3 oz)    Exam:   General:  Afebrile, less confuse and trying to put history together for his admission; no CP and even breathing is better, still unable to speak in full sentences.  Cardiovascular: rate controlled, no JVD, no rubs or gallops  Respiratory: scattered rhonchi and decrease BS at bases; no wheezing   Abdomen: soft, NT, ND, positive BS  Musculoskeletal: no edema, no cyanosis   Data Reviewed: Basic Metabolic Panel:  Recent Labs Lab 04/30/2015 1719 05/13/15 0240  NA 130* 131*  K 5.4* 5.1  CL 97* 100*  CO2 22 20*  GLUCOSE 306* 288*  BUN 40* 38*  CREATININE 3.11* 2.92*  CALCIUM 9.0 8.9   Liver Function  Tests:  Recent Labs Lab 04/28/2015 1719  AST 80*  ALT 77*  ALKPHOS 65  BILITOT 1.1  PROT 6.3*  ALBUMIN 2.7*   CBC:  Recent Labs Lab 04/17/2015 1719 05/13/15 0240 05/14/15 0600  WBC 7.3 8.6 9.5  NEUTROABS 5.7  --   --   HGB 11.6* 10.9* 16.2  HCT 34.2* 32.2* 49.5   MCV 98.8 99.1 96.1  PLT 184 191 126*   BNP (last 3 results)  Recent Labs  02/27/15 0924 04/28/15 0324 04/29/2015 1719  BNP 325.4* 470.1* 625.3*    ProBNP (last 3 results)  Recent Labs  07/16/14 1213  PROBNP 460.0*    CBG:  Recent Labs Lab 05/13/15 1153 05/13/15 1719 05/13/15 2133 05/14/15 0730 05/14/15 1247  GLUCAP 208* 107* 130* 116* 220*    Recent Results (from the past 240 hour(s))  Blood culture (routine x 2)     Status: None (Preliminary result)   Collection Time: 04/24/2015 12:11 PM  Result Value Ref Range Status   Specimen Description BLOOD LEFT HAND  Final   Special Requests BOTTLES DRAWN AEROBIC ONLY 4CC  Final   Culture NO GROWTH 2 DAYS  Final   Report Status PENDING  Incomplete  Blood culture (routine x 2)     Status: None (Preliminary result)   Collection Time: 05/06/2015  6:16 PM  Result Value Ref Range Status   Specimen Description BLOOD RIGHT ARM  Final   Special Requests BOTTLES DRAWN AEROBIC AND ANAEROBIC 5CC  Final   Culture NO GROWTH 2 DAYS  Final   Report Status PENDING  Incomplete  MRSA PCR Screening     Status: None   Collection Time: 05/08/2015  9:00 PM  Result Value Ref Range Status   MRSA by PCR NEGATIVE NEGATIVE Final    Comment:        The GeneXpert MRSA Assay (FDA approved for NASAL specimens only), is one component of a comprehensive MRSA colonization surveillance program. It is not intended to diagnose MRSA infection nor to guide or monitor treatment for MRSA infections.   C difficile quick scan w PCR reflex     Status: None   Collection Time: 05/13/15 10:58 PM  Result Value Ref Range Status   C Diff antigen NEGATIVE NEGATIVE Final   C Diff toxin NEGATIVE NEGATIVE Final   C Diff interpretation Negative for toxigenic C. difficile  Final    Studies: Dg Chest 2 View  05/14/2015  CLINICAL DATA:  Shortness of breath. History of hypertension and congestive heart failure. EXAM: CHEST  2 VIEW COMPARISON:  05/01/2015 FINDINGS:  Dual lead pacer noted. Mild enlargement of the cardiopericardial silhouette, with bilateral indistinct perihilar and basilar airspace opacities favoring airspace edema. Indistinct pulmonary vasculature. Moderate bilateral pleural effusions. Thoracic spondylosis noted. IMPRESSION: 1. Mild enlargement of the cardiopericardial silhouette with increase in pulmonary edema, and moderate bilateral pleural effusions. Electronically Signed   By: Van Clines M.D.   On: 05/14/2015 08:41   Dg Chest Portable 1 View  05/03/2015  CLINICAL DATA:  Dyspnea for 1 day. EXAM: PORTABLE CHEST 1 VIEW COMPARISON:  04/28/2015. FINDINGS: Borderline enlarged cardiac silhouette with a mild decrease in size. The interstitial markings remain prominent. Interval ill-defined opacity at both lung bases, greater on the right. Stable left subclavian pacemaker leads. Thoracic spine degenerative changes. IMPRESSION: 1. Bibasilar atelectasis or pneumonia. 2. Small right pleural effusion and possible small left pleural effusion. Electronically Signed   By: Claudie Revering M.D.   On: 04/16/2015 17:40  Scheduled Meds: . allopurinol  100 mg Oral BID  . amiodarone  200 mg Oral QHS  . apixaban  2.5 mg Oral BID  . bethanechol  25 mg Oral QID  . budesonide (PULMICORT) nebulizer solution  0.25 mg Nebulization BID  . ceFEPime (MAXIPIME) IV  1 g Intravenous Q24H  . ferrous sulfate  325 mg Oral Q breakfast  . insulin aspart  0-9 Units Subcutaneous TID WC  . insulin glargine  12 Units Subcutaneous QHS  . isosorbide mononitrate  60 mg Oral Daily  . latanoprost  1 drop Both Eyes QHS  . megestrol  200 mg Oral Daily  . metoprolol tartrate  25 mg Oral Daily  . ramelteon  8 mg Oral QHS  . tamsulosin  0.4 mg Oral QPC supper  . vancomycin  1,000 mg Intravenous Q48H   Continuous Infusions: . sodium chloride Stopped (05/14/15 0800)    Principal Problem:   HCAP (healthcare-associated pneumonia) Active Problems:   Type 2 diabetes mellitus  with peripheral neuropathy (HCC)   Chronic anticoagulation   CKD (chronic kidney disease) stage 4, GFR 15-29 ml/min (HCC)   Chronic diastolic heart failure (HCC)   Dehydration   Chronic systolic CHF (congestive heart failure) (HCC)   SSS (sick sinus syndrome) ([place])   Delirium not superimposed on dementia    Time spent: 35 minutes    Barton Dubois  [facility] Pager [phone]. If 7PM-7AM, please contact night-coverage at [email or web address], password [facility] 05/14/2015, 1:46 PM  LOS: 2 days

## 2015-05-14 NOTE — Progress Notes (Addendum)
Pt reported increased shortness of breath and tightness in his chest. Upon assessment, I noticed pt using accessory muscles to breath. Upon auscultation, lungs sounded clear but diminished. O2 sats 96% with respirations increased. Oxygen increased to 3L via nasal cannula. Cardiologist Dr. Claiborne Billings and rapid response paged and asked to come assess pt further. Pt was given nebulized treatment by RR and ordered CXR and IV lasix by MD. Symptoms seem to have improved. Will continue to monitor.

## 2015-05-15 DIAGNOSIS — R0602 Shortness of breath: Secondary | ICD-10-CM | POA: Insufficient documentation

## 2015-05-15 DIAGNOSIS — R1314 Dysphagia, pharyngoesophageal phase: Secondary | ICD-10-CM | POA: Insufficient documentation

## 2015-05-15 DIAGNOSIS — I5043 Acute on chronic combined systolic (congestive) and diastolic (congestive) heart failure: Secondary | ICD-10-CM

## 2015-05-15 DIAGNOSIS — I5023 Acute on chronic systolic (congestive) heart failure: Secondary | ICD-10-CM | POA: Insufficient documentation

## 2015-05-15 LAB — BASIC METABOLIC PANEL
Anion gap: 11 (ref 5–15)
BUN: 43 mg/dL — AB (ref 6–20)
CHLORIDE: 100 mmol/L — AB (ref 101–111)
CO2: 22 mmol/L (ref 22–32)
CREATININE: 3.15 mg/dL — AB (ref 0.61–1.24)
Calcium: 9.4 mg/dL (ref 8.9–10.3)
GFR calc non Af Amer: 16 mL/min — ABNORMAL LOW (ref >60)
GFR, EST AFRICAN AMERICAN: 18 mL/min — AB (ref >60)
Glucose, Bld: 255 mg/dL — ABNORMAL HIGH (ref 65–99)
POTASSIUM: 4.7 mmol/L (ref 3.5–5.1)
Sodium: 133 mmol/L — ABNORMAL LOW (ref 135–145)

## 2015-05-15 LAB — GLUCOSE, CAPILLARY
GLUCOSE-CAPILLARY: 227 mg/dL — AB (ref 65–99)
GLUCOSE-CAPILLARY: 244 mg/dL — AB (ref 65–99)
Glucose-Capillary: 232 mg/dL — ABNORMAL HIGH (ref 65–99)
Glucose-Capillary: 223 mg/dL — ABNORMAL HIGH (ref 65–99)

## 2015-05-15 MED ORDER — DEXTROMETHORPHAN POLISTIREX ER 30 MG/5ML PO SUER
15.0000 mg | Freq: Three times a day (TID) | ORAL | Status: DC | PRN
  Administered 2015-05-15 – 2015-05-19 (×3): 15 mg via ORAL
  Filled 2015-05-15 (×11): qty 5

## 2015-05-15 MED ORDER — AMOXICILLIN-POT CLAVULANATE 500-125 MG PO TABS
1.0000 | ORAL_TABLET | Freq: Two times a day (BID) | ORAL | Status: DC
  Administered 2015-05-15 – 2015-05-18 (×7): 500 mg via ORAL
  Filled 2015-05-15 (×11): qty 1

## 2015-05-15 MED ORDER — FUROSEMIDE 10 MG/ML IJ SOLN
80.0000 mg | Freq: Two times a day (BID) | INTRAMUSCULAR | Status: AC
  Administered 2015-05-15 – 2015-05-16 (×3): 80 mg via INTRAVENOUS
  Filled 2015-05-15 (×3): qty 8

## 2015-05-15 MED ORDER — STARCH (THICKENING) PO POWD
ORAL | Status: DC | PRN
  Filled 2015-05-15 (×2): qty 227

## 2015-05-15 MED ORDER — FUROSEMIDE 10 MG/ML IJ SOLN
20.0000 mg | Freq: Every day | INTRAMUSCULAR | Status: DC
  Filled 2015-05-15: qty 2

## 2015-05-15 MED ORDER — FUROSEMIDE 10 MG/ML IJ SOLN
40.0000 mg | Freq: Once | INTRAMUSCULAR | Status: AC
  Administered 2015-05-15: 40 mg via INTRAVENOUS
  Filled 2015-05-15: qty 4

## 2015-05-15 NOTE — Progress Notes (Addendum)
Pt again having increased SOB, coarse crackles with a wet cough. O2 sats decreased from 100% on 3L to 95% on 4L. MD paged. Another 40 mg of Lasix ordered and fluids discontinued per MD order. PRN albuterol given. Rapid RN at bedside. Will continue to monitor.

## 2015-05-15 NOTE — Clinical Social Work Note (Signed)
Clinical Social Work Assessment  Patient Details  Name: Darrell MAGILL, MD MRN: [id number] Date of Birth: December 05, 1918  Date of referral:  05/15/15               Reason for consult:  Facility Placement                Permission sought to share information with:  Family Supports Permission granted to share information::  Yes, Verbal Permission Granted  Name::        Agency::     Relationship::     Contact Information:     Housing/Transportation Living arrangements for the past 2 months:  [place] of Information:  Patient, Adult Children Patient Interpreter Needed:  None Criminal Activity/Legal Involvement Pertinent to Current Situation/Hospitalization:  No - Comment as needed Significant Relationships:  Adult Children Lives with:  Facility Resident Do you feel safe going back to the place where you live?  Yes Need for family participation in patient care:  Yes (Comment)  Care giving concerns: None identified. Patient and son present. Patient and son agreeable to returning to [facility] for continued SNF care.    Social Worker assessment / plan: Facilitate transition to SNF.   Employment status:  Retired Health visitor, Managed Care PT Recommendations:  [place] / Referral to community resources:  [place]  Patient/Family's Response to care: Patient and son feel they are getting good care.   Patient/Family's Understanding of and Emotional Response to Diagnosis, Current Treatment, and Prognosis:  Son identified that patient's CHF has lead to multiple hospitalizations along with other medical issues, which they are working to treat.   Emotional Assessment Appearance:  Appears older than stated age Attitude/Demeanor/Rapport:   (Pleasant) Affect (typically observed):  Accepting, Appropriate Orientation:  Oriented to Self, Oriented to Place, Oriented to  Time Alcohol / Substance use:  Not Applicable Psych  involvement (Current and /or in the community):  Yes (Comment)  Discharge Needs  Concerns to be addressed:  No discharge needs identified Readmission within the last 30 days:  Yes Current discharge risk:  None Barriers to Discharge:  No Barriers Identified   Capricia Serda, Daneil Dolin, LCSW 05/15/2015, 5:06 PM

## 2015-05-15 NOTE — Progress Notes (Signed)
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  The patient is currently on day 4 of vanc/cefepime for HCAP.  Assessment/Plan: This patient's current antibiotics will be continued without adjustments.  Continue Vanc 1000 mg Q48H Continue Cefepime 1 g IV q24h  Vanc Trough at SS if vanc is continued Monitor renal fxn, s/sx of worsening infection  F/U narrowing therapy or d/c abx   Temp (24hrs), Avg:98 F (36.7 C), Min:97.4 F (36.3 C), Max:98.6 F (37 C)   Recent Labs Lab 05/07/2015 1719 05/13/15 0240 05/14/15 0600  WBC 7.3 8.6 9.5    Recent Labs Lab 05/11/2015 1719 05/13/15 0240 05/15/15 0320  CREATININE 3.11* 2.92* 3.15*   Estimated Creatinine Clearance: 14.9 mL/min (by C-G formula based on Cr of 3.15).    Allergies  Allergen Reactions  . Statins Other (See Comments)    rhabdomyolisis  . Aggrenox [Aspirin-Dipyridamole Er] Other (See Comments)    Dizziness  . Carvedilol Other (See Comments)    Dizziness  . Claritin [Loratadine] Other (See Comments)    Dizziness & drowsiness   . Cymbalta [Duloxetine Hcl]     Confusion   . Latanoprost Itching    Watery   . Lyrica [Pregabalin] Other (See Comments)    Makes him dizzy. Takes him days to recover.  . Mucinex [Guaifenesin Er] Other (See Comments)    Dizziness, drowsiness  . Other Other (See Comments)    Preservatives in glaucoma gtts cause eye irritation, has to use preservative free  . Phenergan [Promethazine Hcl] Other (See Comments)    Causes severe confusion  . Plavix [Clopidogrel Bisulfate] Other (See Comments)    Dizziness  . Rapaflo [Silodosin] Other (See Comments)    Dizziness for about 30- 45  Minutes At time of preop appointment on 08/26/2014 patient denies any problems with this medication  . Travatan Z [Travoprost] Other (See Comments)    Eye irritation    Antimicrobials this admission: Vanc 12/29 >> Cefepime 12/29 >>  Microbiology  results: 12/29 [facility] NG2D 12/30 C diff Negative antigen, negative toxin 12/29 MRSA PCR Nasal Negative  Thank you for allowing pharmacy to be a part of this patient's care.  [place] PharmD 05/15/2015 9:10 AM

## 2015-05-15 NOTE — Progress Notes (Signed)
[facility] PROGRESS NOTE  [name], MD [id number] DOB: [birth year] DOA: 04/28/2015 PCP: [name], MD  Assessment/Plan: Healthcare associated pneumonia/SOB Patient with episode of hypoxia at SNF Will transition to PO Augmentin  Will continue working with flutter valve and pulmicort; no more wheezing  appreciated CXR to be repeated on Monday No elevation on WBC's and he has remained afebrile. Will follow cardiology rec's and attempt diuretics challenge today 05/15/15 Will follow clinical response   Low back pain Appears to be chronic. Recently seen by neurosurgery and was placed on steroids patient at [facility] for rehabilitation.  Chronic systolic and diastolic congestive heart failure Last measured EF was about 35-40%.  Thought to be volume depleted by cardiology and given IV fluids on admission.  Today very crackly on exam; ongoing shallow breathings and with CXR demonstrating vascular congestion and pleural effusion. Will continue daily weights, low sodium diet and strict intake and output  Chronic kidney disease stage III and hyperkalemia Creatinine has improved with hydration.  Last Cr 3.1, will monitor trend will need diuretics challenge today and repeat x-ray on Monday to follow vascular congestion and pleural effusion   History of sick sinus syndrome, status post pacemaker HR stable; will monitor on telemetry   History of paroxysmal atrial fibrillation HR is well controlled Cardiology is on board and will follow their recommendations Will continue amiodarone, metoprolol and anticoagulation with Apixaban.  Chronic anemia Stable.  Will Continue to monitor.  History of gout Will continue allopurinol No joint swelling appreciated  Diabetes mellitus type 2 with chronic kidney disease Continue Lantus with sliding scale coverage.  CBGs are stable overall.  History of obstructive uropathy with Foley catheter Continue foley Continue flomax Outpatient  follow up with urology   Code Status: Full Family Communication: caregiver at bedside  Disposition Plan: will remain inpatient; will switch antibiotics to PO; SPL has evaluated patient and recommended dysphagia 2 with nectar thick liquids. Will give a diuretics challenge today. Will follow CXR in am and also BMET   Consultants:  Cardiology   Procedures:  See below for x-ray reports   Antibiotics:  Cefepime 12/29>>05/15/15  Vancomycin 12/29>>05/15/15  HPI/Subjective: Afebrile, no CP. Patient with overnight event of SOB and still with difficulty speaking on full sentences. He is requiring oxygen supplementation and positive orthopnea on exam; unable to tolerate flat bed.  Objective: Filed Vitals:   05/14/15 2113 05/15/15 0433  BP: 123/72 125/48  Pulse: 64 62  Temp: 98.1 F (36.7 C) 98.6 F (37 C)  Resp: 26 24    Intake/Output Summary (Last 24 hours) at 05/15/15 1009 Last data filed at 05/15/15 0550  Gross per 24 hour  Intake    490 ml  Output    502 ml  Net    -12 ml   Filed Weights   05/03/2015 2104  Weight: 84 kg (185 lb 3 oz)    Exam:   General:  Afebrile, with episode of SOB and some coughing spells around food intake. Continue to be intermittently confused and has require the use of mittens for safety (pulling IV's, Telemetry, etc...). On exam with shallow breathing and tachypnea. Having difficulty speaking in full sentences.  Cardiovascular: rate controlled, no JVD appreciated, no rubs or gallops; positive SEM  Respiratory: scattered rhonchi, bibasilar crackles and fair air movement; no wheezing   Abdomen: soft, NT, ND, positive BS  Musculoskeletal: no edema, no cyanosis   Data Reviewed: Basic Metabolic Panel:  Recent Labs Lab 05/10/2015 1719 05/13/15 0240 05/15/15 0320  NA 130* 131* 133*  K 5.4* 5.1 4.7  CL 97* 100* 100*  CO2 22 20* 22  GLUCOSE 306* 288* 255*  BUN 40* 38* 43*  CREATININE 3.11* 2.92* 3.15*  CALCIUM 9.0 8.9 9.4   Liver Function  Tests:  Recent Labs Lab 04/18/2015 1719  AST 80*  ALT 77*  ALKPHOS 65  BILITOT 1.1  PROT 6.3*  ALBUMIN 2.7*   CBC:  Recent Labs Lab 05/13/2015 1719 05/13/15 0240 05/14/15 0600  WBC 7.3 8.6 9.5  NEUTROABS 5.7  --   --   HGB 11.6* 10.9* 16.2  HCT 34.2* 32.2* 49.5  MCV 98.8 99.1 96.1  PLT 184 191 126*   BNP (last 3 results)  Recent Labs  02/27/15 0924 04/28/15 0324 05/10/2015 1719  BNP 325.4* 470.1* 625.3*    ProBNP (last 3 results)  Recent Labs  07/16/14 1213  PROBNP 460.0*    CBG:  Recent Labs Lab 05/14/15 0730 05/14/15 1247 05/14/15 1651 05/14/15 2107 05/15/15 0803  GLUCAP 116* 220* 196* 309* 232*    Recent Results (from the past 240 hour(s))  Blood culture (routine x 2)     Status: None (Preliminary result)   Collection Time: 04/16/2015 12:11 PM  Result Value Ref Range Status   Specimen Description BLOOD LEFT HAND  Final   Special Requests BOTTLES DRAWN AEROBIC ONLY 4CC  Final   Culture NO GROWTH 2 DAYS  Final   Report Status PENDING  Incomplete  Blood culture (routine x 2)     Status: None (Preliminary result)   Collection Time: 05/14/2015  6:16 PM  Result Value Ref Range Status   Specimen Description BLOOD RIGHT ARM  Final   Special Requests BOTTLES DRAWN AEROBIC AND ANAEROBIC 5CC  Final   Culture NO GROWTH 2 DAYS  Final   Report Status PENDING  Incomplete  MRSA PCR Screening     Status: None   Collection Time: 05/01/2015  9:00 PM  Result Value Ref Range Status   MRSA by PCR NEGATIVE NEGATIVE Final    Comment:        The GeneXpert MRSA Assay (FDA approved for NASAL specimens only), is one component of a comprehensive MRSA colonization surveillance program. It is not intended to diagnose MRSA infection nor to guide or monitor treatment for MRSA infections.   C difficile quick scan w PCR reflex     Status: None   Collection Time: 05/13/15 10:58 PM  Result Value Ref Range Status   C Diff antigen NEGATIVE NEGATIVE Final   C Diff toxin  NEGATIVE NEGATIVE Final   C Diff interpretation Negative for toxigenic C. difficile  Final    Studies: Dg Chest 2 View  05/14/2015  CLINICAL DATA:  Shortness of breath. History of hypertension and congestive heart failure. EXAM: CHEST  2 VIEW COMPARISON:  04/23/2015 FINDINGS: Dual lead pacer noted. Mild enlargement of the cardiopericardial silhouette, with bilateral indistinct perihilar and basilar airspace opacities favoring airspace edema. Indistinct pulmonary vasculature. Moderate bilateral pleural effusions. Thoracic spondylosis noted. IMPRESSION: 1. Mild enlargement of the cardiopericardial silhouette with increase in pulmonary edema, and moderate bilateral pleural effusions. Electronically Signed   By: Van Clines M.D.   On: 05/14/2015 08:41   Dg Chest Port 1 View  05/14/2015  CLINICAL DATA:  Acute onset of shortness of breath. Initial encounter. EXAM: PORTABLE CHEST 1 VIEW COMPARISON:  Chest radiograph performed earlier today at 8:14 a.m. FINDINGS: The lungs are well-aerated. Small bilateral pleural effusions are seen. Vascular congestion is noted,  with bilateral central airspace opacities, compatible with pulmonary edema. No pneumothorax is identified. The cardiomediastinal silhouette is borderline normal in size. A pacemaker is noted overlying the left chest wall, with leads ending overlying the right atrium and right ventricle. No acute osseous abnormalities are seen. IMPRESSION: Small bilateral pleural effusions seen. Vascular congestion, with bilateral central airspace opacities, compatible with pulmonary edema. Electronically Signed   By: Garald Balding M.D.   On: 05/14/2015 22:03    Scheduled Meds: . allopurinol  100 mg Oral BID  . amiodarone  200 mg Oral QHS  . amoxicillin-clavulanate  1 tablet Oral Q12H  . apixaban  2.5 mg Oral BID  . bethanechol  25 mg Oral QID  . budesonide (PULMICORT) nebulizer solution  0.25 mg Nebulization BID  . ferrous sulfate  325 mg Oral Q  breakfast  . furosemide  20 mg Intravenous Daily  . insulin aspart  0-9 Units Subcutaneous TID WC  . insulin glargine  12 Units Subcutaneous QHS  . isosorbide mononitrate  60 mg Oral Daily  . latanoprost  1 drop Both Eyes QHS  . megestrol  200 mg Oral Daily  . metoprolol tartrate  25 mg Oral Daily  . ramelteon  8 mg Oral QHS  . tamsulosin  0.4 mg Oral QPC supper   Continuous Infusions:    Principal Problem:   HCAP (healthcare-associated pneumonia) Active Problems:   Type 2 diabetes mellitus with peripheral neuropathy (HCC)   Chronic anticoagulation   CKD (chronic kidney disease) stage 4, GFR 15-29 ml/min (HCC)   Chronic diastolic heart failure (HCC)   Dehydration   Chronic systolic CHF (congestive heart failure) (HCC)   SSS (sick sinus syndrome) ([place])   Delirium not superimposed on dementia   Primary gout    Time spent: 35 minutes    Barton Dubois  [facility] Pager [phone]. If 7PM-7AM, please contact night-coverage at [email or web address], password [facility] 05/15/2015, 10:09 AM  LOS: 3 days

## 2015-05-15 NOTE — Progress Notes (Signed)
Paged Dr Dyann Kief to make aware of condition over night. Also asked Dr Dyann Kief if SLP can be consulted. Waiting to hear back from dr. Bed remains in lowest position and call bell is within reach. Caregiver is at bedside.

## 2015-05-15 NOTE — Evaluation (Signed)
Clinical/Bedside Swallow Evaluation Patient Details  Name: Darrell YEHL, MD MRN: [id number] Date of Birth: 01-22-1919  Today's Date: 05/15/2015 Time: SLP Start Time (ACUTE ONLY): 61 SLP Stop Time (ACUTE ONLY): 0945 SLP Time Calculation (min) (ACUTE ONLY): 30 min  Past Medical History:  Past Medical History  Diagnosis Date  . Hyperlipidemia   . Spinal stenosis   . Anemia   . Carotid artery disease ([place])     a. s/p prior carotid endarterectomy.  [name] PVD (peripheral vascular disease) ([place])   . Hyponatremia     Chronic  . CVA (cerebral infarction)     a. When asked to clarify patient says he was told thumb numbness may be TIA. He does not recall formal stroke. CT head results are only available through [facility], not in [facility].  . CKD (chronic kidney disease) stage 4, GFR 15-29 ml/min (HCC)   . Long term (current) use of anticoagulants   . PAF (paroxysmal atrial fibrillation) ([place])     a. Asymptomatic. On Eliquis. CHADSVASC 7.  . Sick sinus syndrome ([place])     With DDD St. Jude PM, initially placed in 1993 by Dr. Nils Pyle. Device upgrade 10/2002 to DDD, by Dr. Rollene Fare complicated by bleeding. Subsequent gen change 2011.  [name] Hypertensive cardiovascular disease   . Chronic diastolic congestive heart failure ([place])     ECHO 05/20/12 LVEF estimated by 2D at 55-60%  . Arthritis   . Gait disorder   . Diabetic peripheral neuropathy associated with type 2 diabetes mellitus ([place])   . Diabetes mellitus (HCC)     insulin dependent  . Elevated troponin I level 06/22/2014    a. 06/2014 felt due to demand ischemia.  . Chronic anticoagulation     For PAF, on Eliquis   . Urinary retention   . Pulmonary hypertension ([place])   . Foot drop, left 07/21/2014  . Asterixis 07/21/2014  . Hypertension   . Presence of permanent cardiac pacemaker   . Bilateral plantar fasciitis   . Pneumonia     hx of pneumonia x 1   . HCAP (healthcare-associated pneumonia) 04/28/2015   Past Surgical History:  Past Surgical History   Procedure Laterality Date  . Lumbar laminectomy  1995  . Back surgery    . Total knee arthroplasty Left   . Quadriceps tendon repair    . Cataract extraction    . Carotid endarterectomy    . Yag laser application Right [id number]    Procedure: YAG LASER CAPSULOTOMY OF RIGHT EYE;  Surgeon: [name], MD;  Location: [facility];  Service: Ophthalmology;  Laterality: Right;  . Tonsillectomy    . Pacemaker insertion      DDD St. Jude PM, initially placed in 1993 by Dr. Nils Pyle. Device upgrade 10/2002 to DDD, by Dr. Rollene Fare complicated by bleeding.  . Carotid endarterectomy Right 2006  . Transurethral resection of prostate    . Cardioversion N/A 06/16/2013    Procedure: CARDIOVERSION BEDSIDE;  Surgeon: [name], MD;  Location: [facility];  Service: Cardiovascular;  Laterality: N/A;  . Pacemaker generator change  12/09/2009    SJM Accent DR RF gen change by Dr [name]  . Insert / replace / remove pacemaker      2004   . Transurethral resection of prostate N/A 08/27/2014    Procedure: TRANSURETHRAL RESECTION OF THE PROSTATE WITH GYRUS INSTRUMENTS;  Surgeon: [name], MD;  Location: [facility];  Service: Urology;  Laterality: N/A;  . 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 PMH significant for CHF, CKD stage 4, HTN, DM2, pacemaker in June, recently admitted for HCAP earlier this month.  Now admitted with HCAP.  CXR revealed bilateral pleural effusions and pulmonary edema. Concerns for potential aspiration; coughing with meals evening of 12/31.    Assessment / Plan / Recommendation Clinical Impression  Pt presents with a likely acute reversible dysphagia secondary to increased respiratory demands, acute confusion, decreased functional reserve, leading to  discoordination of ventilatory/swallow sequence.  He is at increased risk for aspiration at this time- for now, recommend temporary, conservative diet of dysphagia 2, nectar-thick liquids.  SLP will follow for PO toleration and necessity of instrumental testing.  Caregiver and friend of 50 years, [name], at bedside and present for exam/explanation of results.  D/W RN.     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 PMH significant for CHF, CKD stage 4, HTN, DM2, pacemaker in June, recently admitted for HCAP earlier this month.  Now admitted with HCAP.  CXR revealed bilateral pleural effusions and pulmonary edema. Concerns for potential aspiration; coughing with meals evening of 12/31.  Type of Study: Bedside Swallow Evaluation Previous Swallow Assessment: no Diet Prior to this Study: Thin liquids Temperature Spikes Noted: No Respiratory Status: Nasal cannula History of Recent Intubation: No Behavior/Cognition: Alert;Cooperative;Confused Oral Cavity Assessment: Within Functional Limits Oral Care Completed by SLP: No Oral Cavity - Dentition: Adequate natural dentition Vision: Functional for self-feeding Self-Feeding Abilities: Needs assist Patient Positioning: Upright in bed Baseline Vocal Quality: Normal Volitional Cough: Strong Volitional Swallow: Able to elicit    Oral/Motor/Sensory Function Overall Oral Motor/Sensory Function: Within functional limits   Ice  Chips Ice chips: Within functional limits   Thin Liquid Thin Liquid: Impaired Presentation: Cup;Straw Pharyngeal  Phase Impairments: Multiple swallows;Cough - Delayed    Nectar Thick Nectar Thick Liquid: Within functional limits Presentation: Cup;Straw   Honey Thick Honey Thick Liquid: Not tested   Puree Puree: Within functional limits   Solid   GO    Solid: Impaired Oral Phase Impairments: Poor awareness of bolus (becoming sleepy during assessment)      Xoey Warmoth L. Tivis Ringer, [place] CCC/SLP Pager [phone]  Juan Quam Laurice 05/15/2015,10:06 AM

## 2015-05-15 NOTE — Progress Notes (Signed)
   SUBJECTIVE:  Difficult night with worsening SOB.  Received IV lasix with some improvement.  Per family, he has not been sleeping well at night.    OBJECTIVE:   Vitals:   Filed Vitals:   05/14/15 2050 05/14/15 2112 05/14/15 2113 05/15/15 0433  BP:   123/72 125/48  Pulse:   64 62  Temp:   98.1 F (36.7 C) 98.6 F (37 C)  TempSrc:   Oral Oral  Resp:   26 24  Height:      Weight:      SpO2: 96% 100% 100% 95%   I&O's:    Intake/Output Summary (Last 24 hours) at 05/15/15 1017 Last data filed at 05/15/15 0550  Gross per 24 hour  Intake    490 ml  Output    502 ml  Net    -12 ml   TELEMETRY: sinus  PHYSICAL EXAM General: Well developed, well nourished, sleeping but rouses Head:   Normal cephalic and atramatic  Lungs:   Bibasilar rales 1/3 way up Heart:   RRR,  Abdomen: abdomen soft and non-tender Msk:  Back normal,  Normal strength and tone for age. Extremities:   + dependant edema.   Neuro: Alert and oriented. Psych:  Normal affect, responds appropriately Skin: No rash   LABS: Basic Metabolic Panel:  Recent Labs  05/13/15 0240 05/15/15 0320  NA 131* 133*  K 5.1 4.7  CL 100* 100*  CO2 20* 22  GLUCOSE 288* 255*  BUN 38* 43*  CREATININE 2.92* 3.15*  CALCIUM 8.9 9.4   Liver Function Tests:  Recent Labs  04/22/2015 1719  AST 80*  ALT 77*  ALKPHOS 65  BILITOT 1.1  PROT 6.3*  ALBUMIN 2.7*   No results for input(s): LIPASE, AMYLASE in the last 72 hours. CBC:  Recent Labs  04/14/2015 1719 05/13/15 0240 05/14/15 0600  WBC 7.3 8.6 9.5  NEUTROABS 5.7  --   --   HGB 11.6* 10.9* 16.2  HCT 34.2* 32.2* 49.5  MCV 98.8 99.1 96.1  PLT 184 191 126*   Coag Panel:   Lab Results  Component Value Date   INR 1.42 05/13/2015   INR 1.22 04/18/2015   INR 1.33 06/22/2014   Echo 10/16 from [facility] is reviewed  ASSESSMENT: / PLAN:   1) acute on chronic combined systolic and diastolic dysfunction Very difficult situation in the setting of CRI Clearly  volume overloaded by exam and CXR Will give Lasix 80mg  BID this afternoon and again tomorrow (I have only ordered 3 doses).  Then will need to reassess.  2) Acute on chronic renal faiulre:  Stable No change required today Will need to follow closely with diuresis.   3) ABx for Pneumonia per the primary team.    Prognosis is guarded.  Cardiology to follow  Thompson Grayer, MD  05/15/2015  10:17 AM

## 2015-05-15 NOTE — Progress Notes (Signed)
Pts lung sounds are coarse crackles. O2 saturation 95% on 4L O2. Spoke with Dr Dyann Kief regarding Lasix order. Dr Dyann Kief ordered to change time of Lasix to start now. Notified Pharmacy regarding time changes. Will administer Lasix as soon as available. Will continue to monitor pt frequently throughout day. Bed remains in lowest position and call bell within reach. Family and caregiver at bedside.

## 2015-05-15 DEATH — deceased

## 2015-05-16 ENCOUNTER — Inpatient Hospital Stay (HOSPITAL_COMMUNITY): Payer: Medicare Other

## 2015-05-16 DIAGNOSIS — R0602 Shortness of breath: Secondary | ICD-10-CM | POA: Insufficient documentation

## 2015-05-16 DIAGNOSIS — I5023 Acute on chronic systolic (congestive) heart failure: Secondary | ICD-10-CM

## 2015-05-16 LAB — BASIC METABOLIC PANEL
Anion gap: 14 (ref 5–15)
BUN: 48 mg/dL — ABNORMAL HIGH (ref 6–20)
CO2: 20 mmol/L — ABNORMAL LOW (ref 22–32)
Calcium: 9.3 mg/dL (ref 8.9–10.3)
Chloride: 102 mmol/L (ref 101–111)
Creatinine, Ser: 3.11 mg/dL — ABNORMAL HIGH (ref 0.61–1.24)
GFR calc Af Amer: 18 mL/min — ABNORMAL LOW (ref >60)
GFR calc non Af Amer: 16 mL/min — ABNORMAL LOW (ref >60)
Glucose, Bld: 194 mg/dL — ABNORMAL HIGH (ref 65–99)
Potassium: 5.2 mmol/L — ABNORMAL HIGH (ref 3.5–5.1)
Sodium: 136 mmol/L (ref 135–145)

## 2015-05-16 LAB — GLUCOSE, CAPILLARY
GLUCOSE-CAPILLARY: 266 mg/dL — AB (ref 65–99)
Glucose-Capillary: 165 mg/dL — ABNORMAL HIGH (ref 65–99)
Glucose-Capillary: 307 mg/dL — ABNORMAL HIGH (ref 65–99)
Glucose-Capillary: 240 mg/dL — ABNORMAL HIGH (ref 65–99)

## 2015-05-16 MED ORDER — CHLORHEXIDINE GLUCONATE 0.12 % MT SOLN
15.0000 mL | Freq: Two times a day (BID) | OROMUCOSAL | Status: DC
  Administered 2015-05-17 – 2015-05-18 (×5): 15 mL via OROMUCOSAL
  Filled 2015-05-16 (×5): qty 15

## 2015-05-16 MED ORDER — INSULIN GLARGINE 100 UNIT/ML ~~LOC~~ SOLN
15.0000 [IU] | Freq: Every day | SUBCUTANEOUS | Status: DC
  Administered 2015-05-16 – 2015-05-17 (×2): 15 [IU] via SUBCUTANEOUS
  Filled 2015-05-16 (×3): qty 0.15

## 2015-05-16 MED ORDER — INSULIN ASPART 100 UNIT/ML ~~LOC~~ SOLN: 3.0000 [IU] | Freq: Three times a day (TID) | SUBCUTANEOUS | Status: DC

## 2015-05-16 MED ORDER — CETYLPYRIDINIUM CHLORIDE 0.05 % MT LIQD
7.0000 mL | Freq: Two times a day (BID) | OROMUCOSAL | Status: DC
  Administered 2015-05-17 – 2015-05-18 (×2): 7 mL via OROMUCOSAL

## 2015-05-16 MED ORDER — FUROSEMIDE 10 MG/ML IJ SOLN
40.0000 mg | Freq: Once | INTRAMUSCULAR | Status: AC
  Administered 2015-05-16: 40 mg via INTRAVENOUS
  Filled 2015-05-16: qty 4

## 2015-05-16 NOTE — Progress Notes (Signed)
Pharmacist Heart Failure Core Measure Documentation  Assessment: [name], MD has an EF documented as 35-40% in October 2016 by ECHO at OSH.  Rationale: Heart failure patients with left ventricular systolic dysfunction (LVSD) and an EF < 40% should be prescribed an angiotensin converting enzyme inhibitor (ACEI) or angiotensin receptor blocker (ARB) at discharge unless a contraindication is documented in the medical record.  This patient is not currently on an ACEI or ARB for HF.  This note is being placed in the record in order to provide documentation that a contraindication to the use of these agents is present for this encounter.  ACE Inhibitor or Angiotensin Receptor Blocker is contraindicated (specify all that apply)  []   ACEI allergy AND ARB allergy []   Angioedema []   Moderate or severe aortic stenosis []   Hyperkalemia []   Hypotension []   Renal artery stenosis [x]   Worsening renal function, preexisting renal disease or dysfunction   [name], [name] 05/16/2015 1:01 PM

## 2015-05-16 NOTE — Progress Notes (Signed)
[facility] PROGRESS NOTE  [name], MD [id number] DOB: [birth year] DOA: 05/14/2015 PCP: [name], MD  Assessment/Plan: Healthcare associated pneumonia/SOB and concerns for aspiration Patient with episode of hypoxia at SNF Will continue PO Augmentin  Will continue working with flutter valve and pulmicort; no more wheezing  appreciated CXR demonstrating vascular congestion and pleural effusion  No elevation on WBC's and he has remained afebrile. Will follow cardiology rec's for diuretics Will follow clinical response (condition guarded) Per SPL will follow dysphagia 1 and nectar thick liquids  Low back pain Appears to be chronic. Recently seen by neurosurgery and was placed on steroids patient at [facility] for rehabilitation.  Chronic systolic and diastolic congestive heart failure Last measured EF was about 35-40%.  Thought to be volume depleted by cardiology and given IV fluids on admission.  Continue to very crackly on exam and CXR with signs of vascular congestion and pleural effusion. Will continue daily weights, low sodium diet and strict intake and output  Chronic kidney disease stage III and hyperkalemia Creatinine has improved with hydration.  Last Cr 3.1, will monitor trend Will continue diuretics as dictated by cardiology service  History of sick sinus syndrome, status post pacemaker HR stable; will monitor on telemetry   History of paroxysmal atrial fibrillation HR is well controlled Cardiology is on board and will follow their recommendations Will continue amiodarone, metoprolol and anticoagulation with Apixaban.  Chronic anemia Stable.  Will Continue to monitor. H/H up  History of gout Will continue allopurinol No joint swelling appreciated  Diabetes mellitus type 2 with chronic kidney disease Continue Lantus with sliding scale coverage and will add meal coverage.   History of obstructive uropathy with Foley catheter Continue  foley Continue flomax Outpatient follow up with urology   Dysphagia  -s/p MBS -will follow SPL rec's -dysphagia 1 and nectar thick liquids   Confusion/delirium  -from hypoxia and PNA -minimize benzo's and sedative medications -depending course will add low dose seroquel   Code Status: Full Family Communication: caregiver at bedside  Disposition Plan: will remain inpatient; will continue Augmentin PO; SPL has evaluated patient and recommended dysphagia 1 with nectar thick liquids (after MBS). Will continue diuretics therapy as dictated by cardiology service.   Consultants:  Cardiology   Procedures:  See below for x-ray reports   Antibiotics:  Cefepime 12/29>>05/15/15  Vancomycin 12/29>>05/15/15  Augmentin 05/15/15  HPI/Subjective: Afebrile, no CP. Patient remains intermittently confused; still SOB and with loud crackles. Experienced chocking episode with meal.   Objective: Filed Vitals:   05/16/15 0556 05/16/15 1443  BP: 142/68 138/68  Pulse: 75 64  Temp: 98.4 F (36.9 C) 99.2 F (37.3 C)  Resp: 20 21    Intake/Output Summary (Last 24 hours) at 05/16/15 1701 Last data filed at 05/16/15 1011  Gross per 24 hour  Intake    120 ml  Output    500 ml  Net   -380 ml   Filed Weights   05/10/2015 2104  Weight: 84 kg (185 lb 3 oz)    Exam:   General:  Afebrile, intermittently confused and with episode of chocking with meal. Continue to have abd breathing, shallow breaths, inability to speak in full sentences and loud crackles  Cardiovascular: rate controlled, no JVD appreciated, no rubs or gallops; positive SEM  Respiratory: scattered rhonchi, bibasilar crackles and fair air movement; no wheezing   Abdomen: soft, NT, ND, positive BS  Musculoskeletal: no edema, no cyanosis   Data Reviewed: Basic Metabolic Panel:  Recent  Labs Lab 04/17/2015 1719 05/13/15 0240 05/15/15 0320 05/16/15 0543  NA 130* 131* 133* 136  K 5.4* 5.1 4.7 5.2*  CL 97* 100* 100* 102   CO2 22 20* 22 20*  GLUCOSE 306* 288* 255* 194*  BUN 40* 38* 43* 48*  CREATININE 3.11* 2.92* 3.15* 3.11*  CALCIUM 9.0 8.9 9.4 9.3   Liver Function Tests:  Recent Labs Lab 04/24/2015 1719  AST 80*  ALT 77*  ALKPHOS 65  BILITOT 1.1  PROT 6.3*  ALBUMIN 2.7*   CBC:  Recent Labs Lab 05/03/2015 1719 05/13/15 0240 05/14/15 0600  WBC 7.3 8.6 9.5  NEUTROABS 5.7  --   --   HGB 11.6* 10.9* 16.2  HCT 34.2* 32.2* 49.5  MCV 98.8 99.1 96.1  PLT 184 191 126*   BNP (last 3 results)  Recent Labs  02/27/15 0924 04/28/15 0324 05/14/2015 1719  BNP 325.4* 470.1* 625.3*    ProBNP (last 3 results)  Recent Labs  07/16/14 1213  PROBNP 460.0*    CBG:  Recent Labs Lab 05/15/15 1149 05/15/15 1726 05/15/15 2118 05/16/15 0805 05/16/15 1155  GLUCAP 223* 227* 244* 165* 307*    Recent Results (from the past 240 hour(s))  Blood culture (routine x 2)     Status: None (Preliminary result)   Collection Time: 05/03/2015 12:11 PM  Result Value Ref Range Status   Specimen Description BLOOD LEFT HAND  Final   Special Requests BOTTLES DRAWN AEROBIC ONLY 4CC  Final   Culture NO GROWTH 4 DAYS  Final   Report Status PENDING  Incomplete  Blood culture (routine x 2)     Status: None (Preliminary result)   Collection Time: 05/05/2015  6:16 PM  Result Value Ref Range Status   Specimen Description BLOOD RIGHT ARM  Final   Special Requests BOTTLES DRAWN AEROBIC AND ANAEROBIC 5CC  Final   Culture NO GROWTH 4 DAYS  Final   Report Status PENDING  Incomplete  MRSA PCR Screening     Status: None   Collection Time: 04/20/2015  9:00 PM  Result Value Ref Range Status   MRSA by PCR NEGATIVE NEGATIVE Final    Comment:        The GeneXpert MRSA Assay (FDA approved for NASAL specimens only), is one component of a comprehensive MRSA colonization surveillance program. It is not intended to diagnose MRSA infection nor to guide or monitor treatment for MRSA infections.   C difficile quick scan w PCR  reflex     Status: None   Collection Time: 05/13/15 10:58 PM  Result Value Ref Range Status   C Diff antigen NEGATIVE NEGATIVE Final   C Diff toxin NEGATIVE NEGATIVE Final   C Diff interpretation Negative for toxigenic C. difficile  Final    Studies: Dg Chest 2 View  05/16/2015  CLINICAL DATA:  Shortness of breath. EXAM: CHEST  2 VIEW COMPARISON:  May 14, 2015. FINDINGS: Stable cardiomediastinal silhouette. Left-sided pacemaker is unchanged in position. No pneumothorax is noted. Mildly increased bilateral perihilar and basilar interstitial densities are noted concerning for pulmonary edema. Stable bilateral pleural effusions are noted. Bony thorax is unremarkable. Contrast is seen in the distal esophagus and stomach. IMPRESSION: Mildly increased bilateral pulmonary edema and pleural effusions are noted. Electronically Signed   By: Marijo Conception, M.D.   On: 05/16/2015 16:20   Dg Chest Port 1 View  05/14/2015  CLINICAL DATA:  Acute onset of shortness of breath. Initial encounter. EXAM: PORTABLE CHEST 1 VIEW COMPARISON:  Chest radiograph performed earlier today at 8:14 a.m. FINDINGS: The lungs are well-aerated. Small bilateral pleural effusions are seen. Vascular congestion is noted, with bilateral central airspace opacities, compatible with pulmonary edema. No pneumothorax is identified. The cardiomediastinal silhouette is borderline normal in size. A pacemaker is noted overlying the left chest wall, with leads ending overlying the right atrium and right ventricle. No acute osseous abnormalities are seen. IMPRESSION: Small bilateral pleural effusions seen. Vascular congestion, with bilateral central airspace opacities, compatible with pulmonary edema. Electronically Signed   By: Garald Balding M.D.   On: 05/14/2015 22:03   Dg Swallowing Func-speech Pathology  05/16/2015  Objective Swallowing Evaluation:   MBS Patient Details Name: Darrell MASCORRO, MD MRN: [id number] Date of Birth: 11-17-1918 Today's  Date: 05/16/2015 Time: SLP Start Time (ACUTE ONLY): 1337-SLP Stop Time (ACUTE ONLY): 1357 SLP Time Calculation (min) (ACUTE ONLY): 20 min Past Medical History: @PMH @ Past Surgical History: Past Surgical History Procedure Laterality Date . Lumbar laminectomy  1995 . Back surgery   . Total knee arthroplasty Left  . Quadriceps tendon repair   . Cataract extraction   . Carotid endarterectomy   . Yag laser application Right [id number]   Procedure: YAG LASER CAPSULOTOMY OF RIGHT EYE;  Surgeon: [name], MD;  Location: [place];  Service: Ophthalmology;  Laterality: Right; . Tonsillectomy   . Pacemaker insertion     DDD St. Jude PM, initially placed in 1993 by Dr. Nils Pyle. Device upgrade 10/2002 to DDD, by Dr. Rollene Fare complicated by bleeding. . Carotid endarterectomy Right 2006 . Transurethral resection of prostate   . Cardioversion N/A 06/16/2013   Procedure: CARDIOVERSION BEDSIDE;  Surgeon: [name], MD;  Location: [place];  Service: Cardiovascular;  Laterality: N/A; . Pacemaker generator change  12/09/2009   SJM Accent DR RF gen change by Dr [name] . Insert / replace / remove pacemaker     2004  . Transurethral resection of prostate N/A 08/27/2014   Procedure: TRANSURETHRAL RESECTION OF THE PROSTATE WITH GYRUS INSTRUMENTS;  Surgeon: [name], MD;  Location: [facility];  Service: Urology;  Laterality: N/A; . 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 PMH significant for CHF, CKD stage 4, HTN, DM2, pacemaker in June, recently admitted for HCAP earlier this month.  Now admitted with HCAP.  CXR revealed bilateral pleural effusions and pulmonary edema. Concerns for potential aspiration; coughing with meals evening of 12/31. MBS recommended following clinical observation with nectar thick  liquids.  Subjective: Pleasant, confused (much different than baseline per [name], caregiver) Assessment / Plan / Recommendation CHL IP CLINICAL IMPRESSIONS 05/16/2015 Therapy Diagnosis Moderate oral phase dysphagia;Moderate pharyngeal phase dysphagia;Severe pharyngeal phase dysphagia;Mild cervical esophageal phase dysphagia Clinical Impression Decreased oral cohesion, delayed transit and weak lingual to palatal contact leading to mild lingual base residue. Moderate-severe sensorimotor pharyngeal impairments including decreased sensation, reduced tongue base retraction, decreased laryngeal elevation and anterior hyolaryngeal excursion. Mild-max vallecular and pyriform sinsus residue with inability to elicit second swallow despite max verbal and tactile cues (dry spoon). Aspiration suspected after the swallow from max pyriform sinus residue indicated by sudden, strong reflexive cough when fluro turned off. Nectar thick flash penetrated x 1 and penetration with honey thick barium that remained in vestibule (mild) x 1. Pt is at significant aspiration risk  given current confused state and inability to follow swallow precautions. Recommend Dys 1 and nectar thick liquids, sit upright, adequate sustained attention during meals, crush pills, No straws, small sips and full supervision.       Impact on safety and function Severe aspiration risk   CHL IP TREATMENT RECOMMENDATION 05/16/2015 Treatment Recommendations Therapy as outlined in treatment plan below   Prognosis 05/16/2015 Prognosis for Safe Diet Advancement (No Data) Barriers to Reach Goals Cognitive deficits Barriers/Prognosis Comment -- CHL IP DIET RECOMMENDATION 05/16/2015 SLP Diet Recommendations Dysphagia 1 (Puree) solids;Nectar thick liquid Liquid Administration via Cup;No straw Medication Administration Crushed with puree Compensations Minimize environmental distractions;Slow rate;Small sips/bites Postural Changes Seated upright at 90 degrees   CHL IP OTHER  RECOMMENDATIONS 05/16/2015 Recommended Consults -- Oral Care Recommendations Oral care BID Other Recommendations --   CHL IP FOLLOW UP RECOMMENDATIONS 05/16/2015 Follow up Recommendations (No Data)   CHL IP FREQUENCY AND DURATION 05/16/2015 Speech Therapy Frequency (ACUTE ONLY) min 3x week Treatment Duration 2 weeks      CHL IP ORAL PHASE 05/16/2015 Oral Phase Impaired Oral - Pudding Teaspoon -- Oral - Pudding Cup -- Oral - Honey Teaspoon -- Oral - Honey Cup Delayed oral transit;Weak lingual manipulation;Lingual/palatal residue;Decreased bolus cohesion Oral - Nectar Teaspoon Decreased bolus cohesion;Lingual/palatal residue;Weak lingual manipulation;Delayed oral transit Oral - Nectar Cup Delayed oral transit;Decreased bolus cohesion;Weak lingual manipulation;Lingual/palatal residue Oral - Nectar Straw -- Oral - Thin Teaspoon -- Oral - Thin Cup -- Oral - Thin Straw -- Oral - Puree Delayed oral transit Oral - Mech Soft -- Oral - Regular -- Oral - Multi-Consistency -- Oral - Pill -- Oral Phase - Comment --  CHL IP PHARYNGEAL PHASE 05/16/2015 Pharyngeal Phase Impaired Pharyngeal- Pudding Teaspoon -- Pharyngeal -- Pharyngeal- Pudding Cup -- Pharyngeal -- Pharyngeal- Honey Teaspoon -- Pharyngeal -- Pharyngeal- Honey Cup Delayed swallow initiation-pyriform sinuses;Pharyngeal residue - pyriform;Pharyngeal residue - valleculae;Reduced tongue base retraction;Reduced laryngeal elevation;Penetration/Apiration after swallow Pharyngeal Material enters airway, remains ABOVE vocal cords and not ejected out Pharyngeal- Nectar Teaspoon Delayed swallow initiation-pyriform sinuses;Penetration/Aspiration during swallow;Pharyngeal residue - valleculae;Pharyngeal residue - pyriform;Reduced laryngeal elevation;Reduced anterior laryngeal mobility;Reduced tongue base retraction Pharyngeal Material enters airway, remains ABOVE vocal cords then ejected out Pharyngeal- Nectar Cup Reduced anterior laryngeal mobility;Reduced laryngeal elevation;Pharyngeal  residue - pyriform;Pharyngeal residue - valleculae;Reduced tongue base retraction Pharyngeal -- Pharyngeal- Nectar Straw -- Pharyngeal -- Pharyngeal- Thin Teaspoon -- Pharyngeal -- Pharyngeal- Thin Cup -- Pharyngeal -- Pharyngeal- Thin Straw -- Pharyngeal -- Pharyngeal- Puree Delayed swallow initiation-vallecula Pharyngeal -- Pharyngeal- Mechanical Soft -- Pharyngeal -- Pharyngeal- Regular -- Pharyngeal -- Pharyngeal- Multi-consistency -- Pharyngeal -- Pharyngeal- Pill -- Pharyngeal -- Pharyngeal Comment --  CHL IP CERVICAL ESOPHAGEAL PHASE 05/16/2015 Cervical Esophageal Phase Impaired Pudding Teaspoon -- Pudding Cup -- Honey Teaspoon -- Honey Cup -- Nectar Teaspoon -- Nectar Cup -- Nectar Straw -- Thin Teaspoon -- Thin Cup -- Thin Straw -- Puree -- Mechanical Soft -- Regular -- Multi-consistency -- Pill -- Cervical Esophageal Comment -- [place] Siren 05/16/2015, 2:56 PM [name] Litaker M.Ed CCC-SLP Pager [phone]               Scheduled Meds: . allopurinol  100 mg Oral BID  . amiodarone  200 mg Oral QHS  . amoxicillin-clavulanate  1 tablet Oral Q12H  . apixaban  2.5 mg Oral BID  . bethanechol  25 mg Oral QID  . budesonide (PULMICORT) nebulizer solution  0.25 mg Nebulization BID  . ferrous sulfate  325 mg Oral Q breakfast  .  insulin aspart  0-9 Units Subcutaneous TID WC  . [START ON 05/17/2015] insulin aspart  3 Units Subcutaneous TID WC  . insulin glargine  15 Units Subcutaneous QHS  . isosorbide mononitrate  60 mg Oral Daily  . latanoprost  1 drop Both Eyes QHS  . megestrol  200 mg Oral Daily  . metoprolol tartrate  25 mg Oral Daily  . ramelteon  8 mg Oral QHS  . tamsulosin  0.4 mg Oral QPC supper   Continuous Infusions:    Principal Problem:   HCAP (healthcare-associated pneumonia) Active Problems:   Type 2 diabetes mellitus with peripheral neuropathy (HCC)   Chronic anticoagulation   CKD (chronic kidney disease) stage 4, GFR 15-29 ml/min (HCC)   Chronic diastolic heart failure  (HCC)   Dehydration   Chronic systolic CHF (congestive heart failure) (HCC)   SSS (sick sinus syndrome) ([place])   Delirium not superimposed on dementia   Primary gout   Dysphagia, pharyngoesophageal phase   Acute on chronic systolic congestive heart failure (HCC)   SOB (shortness of breath)    Time spent: 35 minutes    Barton Dubois  [facility] Pager [phone]. If 7PM-7AM, please contact night-coverage at [email or web address], password [email or web address] 05/16/2015, 5:01 PM  LOS: 4 days

## 2015-05-16 NOTE — Procedures (Addendum)
Arrive to give pt treatment. Pulse ox 83 on 5L, HR-64, RR-28. Pt placed on NRB, sats increase to 95%, BS-rhonchi with coarse crackles. Pt using accessory muscles breathe.  RN notified and rapid response called.

## 2015-05-16 NOTE — Progress Notes (Signed)
Speech Language Pathology Treatment: Dysphagia  Patient Details Name: Darrell PENDELTON, MD MRN: [id number] DOB: 1919-01-14 Today's Date: 05/16/2015 Time: [id number] SLP Time Calculation (min) (ACUTE ONLY): 13 min  Assessment / Plan / Recommendation Clinical Impression  Pt confused requiring max tactile and verbal assist for self-feeding with nectar liquids. Coughing prior to juice, suspicious for penetration/aspiration of secretions in addition to wet vocal quality following nectar thick liquids. MBS recommended to fully assess oral and pharyngeal phases for safest po's. MBS scheduled for 1330 today.     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 PMH significant for CHF, CKD stage 4, HTN, DM2, pacemaker in June, recently admitted for HCAP earlier this month.  Now admitted with HCAP.  CXR revealed bilateral pleural effusions and pulmonary edema. Concerns for potential aspiration; coughing with meals evening of 12/31.       SLP Plan  MBS     Recommendations  Diet recommendations: Dysphagia 2 (fine chop);Nectar-thick liquid Liquids provided via: Cup;No straw Medication Administration: Crushed with puree Supervision: Full supervision/cueing for compensatory strategies;Staff to assist with self feeding Compensations: Minimize environmental distractions;Slow rate;Small sips/bites Postural Changes and/or Swallow Maneuvers: Seated upright 90 degrees;Upright 30-60 min after meal              Oral Care Recommendations: Oral care BID Follow up Recommendations:  (TBD) Plan: MBS   Klaire Court, [name] 05/16/2015, 11:21 AM  [name] [name].Ed [employer] [phone]

## 2015-05-16 NOTE — Discharge Instructions (Signed)

## 2015-05-16 NOTE — Progress Notes (Signed)
Speech Pathology  MBSS complete. Full report located under chart review in imaging section. Double click on DG swallow function.   Recommend: Decreased oral cohesion, delayed transit and weak lingual to palatal contact leading to mild lingual base residue. Moderate-severe sensorimotor pharyngeal impairments including decreased sensation, reduced tongue base retraction, decreased laryngeal elevation and anterior hyolaryngeal excursion. Mild-max vallecular and pyriform sinsus residue with inability to elicit second swallow despite max verbal and tactile cues (dry spoon). Aspiration suspected after the swallow from max pyriform sinus residue indicated by sudden, strong reflexive cough when fluro turned off. Nectar thick flash penetrated x 1 and penetration with honey thick barium that remained in vestibule (mild) x 1. Pt is at significant aspiration risk given current confused state and inability to follow swallow precautions. Medical prognosis unknown to this SLP, however swallow prognosis appears guarded; Palliative care may be beneficial. Recommend Dys 1 and nectar thick liquids, sit upright, adequate sustained attention during meals, crush pills, No straws, small sips and full supervision.         [name] [place].Ed [employer] [phone]

## 2015-05-16 NOTE — Significant Event (Signed)
Rapid Response Event Note  Overview: Time Called: 2112 Arrival Time: 2115 Event Type: Respiratory  Initial Focused Assessment:      Received call from [place], RN about pt in acute respiratory distress and O2 sats 86% on 5l [place].  Dom, RT at bedside placed pt on 15 l non rebreathing mask with O2 sats 98%.   Upon my arrival to room pt sitting in bed with increased WOB,audible rale heard at bedside.  Bilateral BS with EW and rales throughout.    121/76 manual BP.  RR 28   Pt had  CXR at 1410 hrs indicating  increase in bilateral pulmonary edema and pleural effusions and was given Lasix 80 mg IV at 1511 hrs. Pt awake, speaking 1-2 words at a time, unclear if he has chest pain at this time.   Interventions:   Chaney Malling, NP notified, at bedside at 2125                             Lasix 40 mg IV given at 2131 per order                             Pt transferred to 3W23 SDU  And placed on  30% Bipap by [name], RT per RT protocol.                             [place], RN notified son of transfer and placement on BiPAP                              Handoff report given to [place], [place] 3W with instruction to call for further assistance if                                         needed.                               Private attendant [name] updated on plan of care by myself and Chaney Malling, NP   Event Summary: Name of Physician Notified: Chaney Malling, NP at 2115    at    Outcome: Transferred (Comment) [phone] SDU)  Event End Time:  (2145)  Ester Rink

## 2015-05-16 NOTE — Progress Notes (Signed)
RT note: RT called by rapid response for patient moving to 3W23. Patient in distress BLBS wet through out. Per RCP protocol placed patient on BIPAP.

## 2015-05-16 NOTE — Care Management Important Message (Signed)
Important Message  Patient Details  Name: Darrell WHITESELL, [name] MRN: [id number] Date of Birth: [birth year]   Medicare Important Message Given:  Bennie Dallas 05/16/2015, 12:50 [place] Message  Patient Details  Name: Darrell HALLECK, [name] MRN: [id number] Date of Birth: 23-Dec-1918   Medicare Important Message Given:  Yes    Makeisha Jentsch, Neal Dy 05/16/2015, 12:49 PM

## 2015-05-16 NOTE — Accreditation Note (Signed)
Pt was in respiratory distress. RT notified of respiratory distress. Pt was admitted for CAP and PE. O2 sat were in the high 80's and low 90's with 5L of O2. Non re-breather was was placed on patient. Rapid response was notified and the MD on call, as well, NP [place]. Rapid response advised to transfer to 3W and to have BiPAP. NP ordered 40mg  Lasix high was given ASAP. NP came upstairs to the room. Son was called as the pt was about to be moved out of the room on 5W. Medications and chart were taken to 3W and bedside report was given to the receiving nurse in 3W23. The rest of the medications were tubed to 3W.

## 2015-05-16 NOTE — Progress Notes (Signed)
Patient ID: [name], MD, male   DOB: [birth year], 82 y.o.   MRN: [id number]   SUBJECTIVE:  Dyspnea  Not sleeping  Seems to have deleriium   OBJECTIVE:   Vitals:   Filed Vitals:   05/15/15 1828 05/15/15 2122 05/16/15 0010 05/16/15 0556  BP:  122/47  142/68  Pulse:  73 80 75  Temp:  97.8 F (36.6 C)  98.4 F (36.9 C)  TempSrc:  Oral  Oral  Resp:  20 20 20   Height:      Weight:      SpO2: 99% 94%  95%   I&O's:    Intake/Output Summary (Last 24 hours) at 05/16/15 0850 Last data filed at 05/16/15 0558  Gross per 24 hour  Intake      0 ml  Output    800 ml  Net   -800 ml   TELEMETRY: sinus  PHYSICAL EXAM General: Well developed, well nourished, sleeping but rouses Head:   Normal cephalic and atramatic  Lungs:   Bibasilar rales 1/3 way up Heart:   RRR,  Abdomen: abdomen soft and non-tender Msk:  Back normal,  Normal strength and tone for age. Extremities:   + dependant edema.   Neuro: Alert and oriented. Psych:  Normal affect, responds appropriately Skin: No rash   LABS: Basic Metabolic Panel:  Recent Labs  05/15/15 0320 05/16/15 0543  NA 133* 136  K 4.7 5.2*  CL 100* 102  CO2 22 20*  GLUCOSE 255* 194*  BUN 43* 48*  CREATININE 3.15* 3.11*  CALCIUM 9.4 9.3   Liver Function Tests: No results for input(s): AST, ALT, ALKPHOS, BILITOT, PROT, ALBUMIN in the last 72 hours. No results for input(s): LIPASE, AMYLASE in the last 72 hours. CBC:  Recent Labs  05/14/15 0600  WBC 9.5  HGB 16.2  HCT 49.5  MCV 96.1  PLT 126*   Coag Panel:   Lab Results  Component Value Date   INR 1.42 04/14/2015   INR 1.22 04/18/2015   INR 1.33 06/22/2014   Echo 10/16 from [facility] is reviewed  ASSESSMENT: / PLAN:   1) acute on chronic combined systolic and diastolic dysfunction Very difficult situation in the setting of CRI Volume overloaded by exam and CXR Continue lasix 80 iv bid for now and tolerate increasing Cr to improve clinical symptomns  2) Acute  on chronic renal faiulre:  Stable  Lab Results  Component Value Date   CREATININE 3.11* 05/16/2015    No change required today Will need to follow closely with diuresis.   3) ABx for Pneumonia per the primary team.    Prognosis is guarded.     Josue Hector, MD  05/16/2015  8:50 AM

## 2015-05-16 NOTE — Progress Notes (Signed)
Inpatient Diabetes Program Recommendations  AACE/ADA: New Consensus Statement on Inpatient Glycemic Control (2015)  Target Ranges:  Prepandial:   less than 140 mg/dL      Peak postprandial:   less than 180 mg/dL (1-2 hours)      Critically ill patients:  140 - 180 mg/dL    Results for [name], MD (MRN [id number]) as of 05/16/2015 15:08  Ref. Range 05/15/2015 08:03 05/15/2015 11:49 05/15/2015 17:26 05/15/2015 21:18  Glucose-Capillary Latest Ref Range: 65-99 mg/dL 232 (H) 223 (H) 227 (H) 244 (H)    Results for RISHAV, MAYE, MD (MRN [id number]) as of 05/16/2015 15:08  Ref. Range 05/16/2015 08:05 05/16/2015 11:55  Glucose-Capillary Latest Ref Range: 65-99 mg/dL 165 (H) 307 (H)    Home DM Meds: Lantus 12 units QHS       Novolog SSI  Current Insulin Orders: Lantus 12 units QHS      Novolog Sensitive SSI (0-9 units) TID AC     MD- Please consider starting low dose Novolog Meal Coverage for this patient-  Novolog 4 units tid with meals    --Will follow patient during hospitalization--  Wyn Quaker RN, MSN, CDE Diabetes Coordinator Inpatient Glycemic Control Team Team Pager: [phone] (8a-5p)

## 2015-05-17 ENCOUNTER — Inpatient Hospital Stay (HOSPITAL_COMMUNITY): Payer: Medicare Other

## 2015-05-17 DIAGNOSIS — R410 Disorientation, unspecified: Secondary | ICD-10-CM

## 2015-05-17 DIAGNOSIS — N184 Chronic kidney disease, stage 4 (severe): Secondary | ICD-10-CM

## 2015-05-17 DIAGNOSIS — J9601 Acute respiratory failure with hypoxia: Secondary | ICD-10-CM

## 2015-05-17 DIAGNOSIS — Z7901 Long term (current) use of anticoagulants: Secondary | ICD-10-CM

## 2015-05-17 LAB — CULTURE, BLOOD (ROUTINE X 2)
CULTURE: NO GROWTH
Culture: NO GROWTH

## 2015-05-17 LAB — GLUCOSE, CAPILLARY
GLUCOSE-CAPILLARY: 188 mg/dL — AB (ref 65–99)
GLUCOSE-CAPILLARY: 246 mg/dL — AB (ref 65–99)
GLUCOSE-CAPILLARY: 249 mg/dL — AB (ref 65–99)
Glucose-Capillary: 201 mg/dL — ABNORMAL HIGH (ref 65–99)

## 2015-05-17 MED ORDER — RESOURCE THICKENUP CLEAR PO POWD
ORAL | Status: DC | PRN
  Filled 2015-05-17: qty 125

## 2015-05-17 NOTE — Progress Notes (Signed)
Subjective: On Bipap.  Sleeping.  Appears to be resting comfortably.   Objective: Vital signs in last 24 hours: Temp:  [98.6 F (37 C)-99.2 F (37.3 C)] 98.6 F (37 C) (01/03 0503) Pulse Rate:  [59-70] 61 (01/03 0503) Resp:  [17-23] 20 (01/03 0400) BP: (98-138)/(48-84) 106/48 mmHg (01/03 0503) SpO2:  [90 %-100 %] 98 % (01/03 0754) FiO2 (%):  [40 %] 40 % (01/03 0754) Weight:  [186 lb 11.7 oz (84.7 kg)-192 lb 7.4 oz (87.3 kg)] 186 lb 11.7 oz (84.7 kg) (01/03 0503) Last BM Date: 05/14/15  Intake/Output from previous day: 01/02 0701 - 01/03 0700 In: 120 [P.O.:120] Out: 600 [Urine:600] Intake/Output this shift:    Medications Scheduled Meds: . allopurinol  100 mg Oral BID  . amiodarone  200 mg Oral QHS  . amoxicillin-clavulanate  1 tablet Oral Q12H  . antiseptic oral rinse  7 mL Mouth Rinse q12n4p  . apixaban  2.5 mg Oral BID  . bethanechol  25 mg Oral QID  . budesonide (PULMICORT) nebulizer solution  0.25 mg Nebulization BID  . chlorhexidine  15 mL Mouth Rinse BID  . ferrous sulfate  325 mg Oral Q breakfast  . insulin aspart  0-9 Units Subcutaneous TID WC  . insulin aspart  3 Units Subcutaneous TID WC  . insulin glargine  15 Units Subcutaneous QHS  . isosorbide mononitrate  60 mg Oral Daily  . latanoprost  1 drop Both Eyes QHS  . megestrol  200 mg Oral Daily  . metoprolol tartrate  25 mg Oral Daily  . ramelteon  8 mg Oral QHS  . tamsulosin  0.4 mg Oral QPC supper   Continuous Infusions:  PRN Meds:.acetaminophen, albuterol, dextromethorphan, food thickener, HYDROcodone-acetaminophen, ondansetron, polyethylene glycol  PE: General appearance: Sleeping quietly on Bipap. Neck: JVD appera slightly elevated.  Lungs: clear to auscultation bilaterally Heart: regular rate and rhythm, S1, S2 normal, no murmur, click, rub or gallop Abdomen: +BS. Extremities: Trace LEE Pulses: 2+ and symmetric Skin: Warm and dry Neurologic: Grossly normal  Lab Results:  No results for  input(s): WBC, HGB, HCT, PLT in the last 72 hours. BMET  Recent Labs  05/15/15 0320 05/16/15 0543  NA 133* 136  K 4.7 5.2*  CL 100* 102  CO2 22 20*  GLUCOSE 255* 194*  BUN 43* 48*  CREATININE 3.15* 3.11*  CALCIUM 9.4 9.3    Assessment/Plan     Acute hypoxic respiratory failure-05/16/15   Acute on chronic systolic congestive heart failure (HCC) EF 45-50%, serve pulmonary HTN 64mmHg(12/2013). Net fluids:  -0.5L/-0.7L.  Weight up from 185 to 186#.  He does not appear grossly volume overloaded.  Stable CXr this morning but looks wet. Getting 80mg  IV lasix BID and got 40 IV last night due to acute respiratory failure with very little urinary OP.  SCr 3.11(stable).   Probably related to severe pulmonary HTN.   Consider a dose of metolazone.      HCAP (healthcare-associated pneumonia)   Type 2 diabetes mellitus with peripheral neuropathy (HCC)   PAF  Paced rhythm.  On amiodarone   Chronic anticoagulation  Eliquis 2.5 BID    CKD (chronic kidney disease) stage 4, GFR 15-29 ml/min (HCC)   Chronic diastolic heart failure (HCC)   Dehydration   Chronic systolic CHF (congestive heart failure) (HCC)   SSS (sick sinus syndrome) ([place])   Delirium not superimposed on dementia   Primary gout   Dysphagia, pharyngoesophageal phase   SOB (shortness of breath)  Shortness of breath   Hyperkalemia  Monitor.     LOS: 5 days    Burtis Imhoff PA-C 05/17/2015 8:36 AM

## 2015-05-17 NOTE — Progress Notes (Signed)
Shift event note:  Notified by RN pt noted w/ acute respiratory distress. 02 sats 86% on 5L [place]. RR RN was paged and responded to bedside. At bedside pt noted w/ increased WOB, RR 28-32 w/ use of accessory muscles. BBS w/ coarse crackles through-out and intermittent faint exp wheezes. 02 sats currently 96% on NRB. Remaining VSS. Recent CXR revealed increased pulmonary edema. Lasix 40 mg given IV. Pt appears quite fatigued. Pt prepared for transfer.  Assessment/Plan: 1. Acute hypoxic respiratory failure: In setting of chronic systolic and diastolic CHF. Pt transferred to SDU where he was placed on BiPAP. Pt immediately appeared more comfortable w/ WOB much improved. Will continue BiPAP for now. Consider transitioning to [place] in am as tolerates.  Repeat CXR in AM. RN has notified pt's son of changes in status and transfer. Continue to monitor closely on SDU.  Jeryl Columbia, NP-C [facility] Pager [phone]

## 2015-05-17 NOTE — Progress Notes (Signed)
UR Completed [place] Pilley Graves-Bigelow, RN,BSN [phone]  

## 2015-05-17 NOTE — Clinical Social Work Note (Signed)
CSW left message with Marzetta Board at [facility] regarding patient and plan for his return at discharge. CSW waiting for call back.   Liz Beach MSW, [place], [place], [id number]

## 2015-05-17 NOTE — Progress Notes (Signed)
Pt weaned off of Bipap, initially starting on Non-Rebreather and has progressed to Nasal Cannula and tolerating that well. MD notified. Will continue to monitor.

## 2015-05-17 NOTE — Care Management Note (Addendum)
Case Management Note  Patient Details  Name: Darrell GOLSON, MD MRN: [id number] Date of Birth: [birth year]  Subjective/Objective:    Pt admitted from [facility] for HCAP- Pt is a transfer to [facility].                 Action/Plan: CSW to assist with disposition needs. CM will continue to monitor for additional needs.   Expected Discharge Date:                  Expected Discharge Plan:  Skilled Nursing Facility  In-House Referral:  Clinical Social Work  Discharge planning Services  CM Consult  Post Acute Care Choice:  NA Choice offered to:  NA  DME Arranged:  N/A DME Agency:  NA  HH Arranged:  NA HH Agency:  NA  Status of Service:  Completed, signed off  Medicare Important Message Given:  Yes Date Medicare IM Given:    Medicare IM give by:    Date Additional Medicare IM Given:    Additional Medicare Important Message give by:     If discussed at [place] of Stay Meetings, dates discussed:    Additional Comments: Palliative Care is following pt and the plan will be to transition to [facility]. CSW assisting with disposition needs. No further needs from CM at this time.   Bethena Roys, RN 05/17/2015, 3:53 PM

## 2015-05-17 NOTE — Clinical Social Work Note (Signed)
CSW received call from [employer] with [facility]. Facility will accept patient back if the facility has a bed available at time of DC. CSW will continue to follow.   Liz Beach MSW, [place], [place], [id number]

## 2015-05-17 NOTE — Progress Notes (Signed)
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 PMH significant for CHF, CKD stage 4, HTN, DM2, pacemaker in June, recently admitted for HCAP earlier this month. He was treated with IV abx, switched to levaquin, and discharged to [facility]. At [facility] he was continued on levaquin, but developed acute worsening dyspnea today, sent to ED for respiratory distress and O2 sat in the 80s. Started on treatment for HCAP with broad spectrum antibiotics and after 3days of IV, transition to PO Augmentin. No fever, no WBC's and no frank infiltrates on CXR. Now with frank acute on chronic HF exacerbation and struggling to respond to ongoing diuretics treatment (limited by chronic renal failure). Cardiology is on Board. Palliative care consulted. Patient care escalated to stepdown for BIPAP use.  Assessment/Plan: Healthcare associated pneumonia/SOB and concerns for aspiration/pneumonitis Patient with episode of hypoxia at SNF Will continue PO Augmentin  Will continue working with flutter valve and pulmicort; no more wheezing  appreciated CXR demonstrating vascular congestion and pleural effusion  No elevation on WBC's and he has remained afebrile. Will follow cardiology rec's for diuretics Will follow clinical response (condition guarded) Per SPL will follow dysphagia 1 and nectar thick liquids  Low back pain Appears to be chronic. Recently seen by neurosurgery and was placed on steroids patient at [facility] for rehabilitation.  Acute on Chronic systolic and diastolic congestive heart failure Last measured EF was about 35-40%.  Thought to be volume depleted by cardiology and given IV fluids on admission.  Now with CXR demonstrating vascular congestion, pleural effusion and beginning of  pulmonary edema. On exam until 05/16/15 was very crackly and with resp distress/orthopnea Has required to be moved to stepdown for BIPAP therapy, secondary to hypoxia Will continue daily weights, low sodium diet and strict intake and output On lasix 80mg  BID as dictated by cardiology service; but has required extra 40mg  2 nights in a row due to episodes of resp distress and crackles on auscultation.  Chronic kidney disease stage III and hyperkalemia Last Cr continue to be stable at 3.1, will monitor trend Will continue diuretics as dictated by cardiology service  History of sick sinus syndrome, status post pacemaker HR stable; will continue telemetry monitoring    History of paroxysmal atrial fibrillation HR is well controlled Cardiology is on board and will follow their recommendations Will continue amiodarone, metoprolol and anticoagulation with Apixaban for now.  Chronic anemia Stable.  Will Continue to monitor. H/H up (?? Intravascular depletion)  History of gout Will continue allopurinol No joint swelling appreciated  Diabetes mellitus type 2 with chronic kidney disease Continue Lantus with sliding scale coverage and will add meal coverage.   History of obstructive uropathy with Foley catheter Continue foley Continue flomax Outpatient follow up with urology   Dysphagia  -s/p MBS -will follow SPL rec's -dysphagia 1 and nectar thick liquids (high risk for aspiration, based on MBS results)  Confusion/delirium  -from hypoxia and PNA -minimize benzo's and sedative medications -depending course might require low dose seroquel   Code Status: Full Family Communication: caregiver at bedside  Disposition Plan: will remain inpatient; will continue Augmentin PO; SPL has evaluated patient and recommended dysphagia 1 with nectar thick liquids (after MBS). Will continue diuretics therapy as dictated by cardiology service. After discussing with Family and his primary cardiologist  (Dr.  [name]) will ask palliative care to meet with family and help dictating Mora and advance directives. Patient currently Full Code.   Consultants:  Cardiology   Palliative Care  Procedures:  See below for x-ray reports   Antibiotics:  Cefepime 12/29>>05/15/15  Vancomycin 12/29>>05/15/15  Augmentin 05/15/15  HPI/Subjective: Afebrile. Patient remains intermittently confused and no CP reported. He experienced severe hypoxic resp failure overnight and required to be transfer to stepdown for BIPAP therapy. Breathing more comfortable at this moment and with good O2 sat.  Objective: Filed Vitals:   05/17/15 0503 05/17/15 0800  BP: 106/48 109/54  Pulse: 61 59  Temp: 98.6 F (37 C)   Resp:      Intake/Output Summary (Last 24 hours) at 05/17/15 1208 Last data filed at 05/17/15 0504  Gross per 24 hour  Intake      0 ml  Output    600 ml  Net   -600 ml   Filed Weights   04/24/2015 2104 05/16/15 1906 05/17/15 0503  Weight: 84 kg (185 lb 3 oz) 87.3 kg (192 lb 7.4 oz) 84.7 kg (186 lb 11.7 oz)    Exam:   General:  Afebrile, comfortable on BIPAP currentl. Patient with acute hypoxic resp failure overnight, that required NRB mask and subsequently BIPAP.   Cardiovascular: rate controlled, mild JVD appreciated hepatojugular maneuver, no rubs or gallops; positive SEM  Respiratory: scattered rhonchi, bibasilar decrease BS and mild crackles; no wheezing   Abdomen: soft, NT, ND, positive BS  Musculoskeletal: no edema, no cyanosis   Data Reviewed: Basic Metabolic Panel:  Recent Labs Lab 05/01/2015 1719 05/13/15 0240 05/15/15 0320 05/16/15 0543  NA 130* 131* 133* 136  K 5.4* 5.1 4.7 5.2*  CL 97* 100* 100* 102  CO2 22 20* 22 20*  GLUCOSE 306* 288* 255* 194*  BUN 40* 38* 43* 48*  CREATININE 3.11* 2.92* 3.15* 3.11*  CALCIUM 9.0 8.9 9.4 9.3   Liver Function Tests:  Recent Labs Lab 04/27/2015 1719  AST 80*  ALT 77*  ALKPHOS 65  BILITOT 1.1  PROT 6.3*  ALBUMIN 2.7*    CBC:  Recent Labs Lab 05/01/2015 1719 05/13/15 0240 05/14/15 0600  WBC 7.3 8.6 9.5  NEUTROABS 5.7  --   --   HGB 11.6* 10.9* 16.2  HCT 34.2* 32.2* 49.5  MCV 98.8 99.1 96.1  PLT 184 191 126*   BNP (last 3 results)  Recent Labs  02/27/15 0924 04/28/15 0324 04/23/2015 1719  BNP 325.4* 470.1* 625.3*    ProBNP (last 3 results)  Recent Labs  07/16/14 1213  PROBNP 460.0*    CBG:  Recent Labs Lab 05/16/15 1155 05/16/15 1706 05/16/15 2229 05/17/15 0740 05/17/15 1122  GLUCAP 307* 240* 266* 201* 188*    Recent Results (from the past 240 hour(s))  Blood culture (routine x 2)     Status: None (Preliminary result)   Collection Time: 04/30/2015 12:11 PM  Result Value Ref Range Status   Specimen Description BLOOD LEFT HAND  Final   Special Requests BOTTLES DRAWN AEROBIC ONLY 4CC  Final   Culture NO GROWTH 4 DAYS  Final   Report Status PENDING  Incomplete  Blood culture (routine x 2)     Status: None (Preliminary result)   Collection Time: 05/10/2015  6:16 PM  Result Value Ref Range Status   Specimen Description BLOOD RIGHT ARM  Final   Special Requests BOTTLES DRAWN AEROBIC AND ANAEROBIC 5CC  Final   Culture NO GROWTH 4 DAYS  Final  Report Status PENDING  Incomplete  MRSA PCR Screening     Status: None   Collection Time: 05/07/2015  9:00 PM  Result Value Ref Range Status   MRSA by PCR NEGATIVE NEGATIVE Final    Comment:        The GeneXpert MRSA Assay (FDA approved for NASAL specimens only), is one component of a comprehensive MRSA colonization surveillance program. It is not intended to diagnose MRSA infection nor to guide or monitor treatment for MRSA infections.   C difficile quick scan w PCR reflex     Status: None   Collection Time: 05/13/15 10:58 PM  Result Value Ref Range Status   C Diff antigen NEGATIVE NEGATIVE Final   C Diff toxin NEGATIVE NEGATIVE Final   C Diff interpretation Negative for toxigenic C. difficile  Final    Studies: Dg Chest 2  View  05/16/2015  CLINICAL DATA:  Shortness of breath. EXAM: CHEST  2 VIEW COMPARISON:  May 14, 2015. FINDINGS: Stable cardiomediastinal silhouette. Left-sided pacemaker is unchanged in position. No pneumothorax is noted. Mildly increased bilateral perihilar and basilar interstitial densities are noted concerning for pulmonary edema. Stable bilateral pleural effusions are noted. Bony thorax is unremarkable. Contrast is seen in the distal esophagus and stomach. IMPRESSION: Mildly increased bilateral pulmonary edema and pleural effusions are noted. Electronically Signed   By: Marijo Conception, M.D.   On: 05/16/2015 16:20   Dg Chest Port 1 View  05/17/2015  CLINICAL DATA:  Hypoxia EXAM: PORTABLE CHEST 1 VIEW COMPARISON:  May 16, 2015 FINDINGS: There is persistent interstitial and patchy alveolar edema bilaterally with bilateral effusions. There is cardiomegaly with pulmonary venous hypertension. Pacemaker leads are attached to the right atrium and right ventricle. No adenopathy. No pneumothorax. IMPRESSION: Persistent changes of congestive heart failure, essentially stable compared to 1 day prior. Electronically Signed   By: Lowella Grip III M.D.   On: 05/17/2015 07:50   Dg Swallowing Func-speech Pathology  05/16/2015  Objective Swallowing Evaluation:   MBS Patient Details Name: Darrell STINGER, MD MRN: [id number] Date of Birth: 1918/07/02 Today's Date: 05/16/2015 Time: SLP Start Time (ACUTE ONLY): 1337-SLP Stop Time (ACUTE ONLY): 1357 SLP Time Calculation (min) (ACUTE ONLY): 20 min Past Medical History: @PMH @ Past Surgical History: Past Surgical History Procedure Laterality Date . Lumbar laminectomy  1995 . Back surgery   . Total knee arthroplasty Left  . Quadriceps tendon repair   . Cataract extraction   . Carotid endarterectomy   . Yag laser application Right [id number]   Procedure: YAG LASER CAPSULOTOMY OF RIGHT EYE;  Surgeon: [name], MD;  Location: [place];  Service: Ophthalmology;   Laterality: Right; . Tonsillectomy   . Pacemaker insertion     DDD St. Jude PM, initially placed in 1993 by Dr. Nils Pyle. Device upgrade 10/2002 to DDD, by Dr. Rollene Fare complicated by bleeding. . Carotid endarterectomy Right 2006 . Transurethral resection of prostate   . Cardioversion N/A 06/16/2013   Procedure: CARDIOVERSION BEDSIDE;  Surgeon: [name], MD;  Location: [place];  Service: Cardiovascular;  Laterality: N/A; . Pacemaker generator change  12/09/2009   SJM Accent DR RF gen change by Dr [name] . Insert / replace / remove pacemaker     2004  . Transurethral resection of prostate N/A 08/27/2014   Procedure: TRANSURETHRAL RESECTION OF THE PROSTATE WITH GYRUS INSTRUMENTS;  Surgeon: [name], MD;  Location: [facility];  Service: Urology;  Laterality: N/A; . 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 PMH significant for CHF, CKD stage 4, HTN, DM2, pacemaker in June, recently admitted for HCAP earlier this month.  Now admitted with HCAP.  CXR revealed bilateral pleural effusions and pulmonary edema. Concerns for potential aspiration; coughing with meals evening of 12/31. MBS recommended following clinical observation with nectar thick liquids.  Subjective: Pleasant, confused (much different than baseline per [name], caregiver) Assessment / Plan / Recommendation CHL IP CLINICAL IMPRESSIONS 05/16/2015 Therapy Diagnosis Moderate oral phase dysphagia;Moderate pharyngeal phase dysphagia;Severe pharyngeal phase dysphagia;Mild cervical esophageal phase dysphagia Clinical Impression Decreased oral cohesion, delayed transit and weak lingual to palatal contact leading to mild lingual base residue. Moderate-severe sensorimotor pharyngeal impairments including decreased sensation, reduced tongue base  retraction, decreased laryngeal elevation and anterior hyolaryngeal excursion. Mild-max vallecular and pyriform sinsus residue with inability to elicit second swallow despite max verbal and tactile cues (dry spoon). Aspiration suspected after the swallow from max pyriform sinus residue indicated by sudden, strong reflexive cough when fluro turned off. Nectar thick flash penetrated x 1 and penetration with honey thick barium that remained in vestibule (mild) x 1. Pt is at significant aspiration risk given current confused state and inability to follow swallow precautions. Recommend Dys 1 and nectar thick liquids, sit upright, adequate sustained attention during meals, crush pills, No straws, small sips and full supervision.       Impact on safety and function Severe aspiration risk   CHL IP TREATMENT RECOMMENDATION 05/16/2015 Treatment Recommendations Therapy as outlined in treatment plan below   Prognosis 05/16/2015 Prognosis for Safe Diet Advancement (No Data) Barriers to Reach Goals Cognitive deficits Barriers/Prognosis Comment -- CHL IP DIET RECOMMENDATION 05/16/2015 SLP Diet Recommendations Dysphagia 1 (Puree) solids;Nectar thick liquid Liquid Administration via Cup;No straw Medication Administration Crushed with puree Compensations Minimize environmental distractions;Slow rate;Small sips/bites Postural Changes Seated upright at 90 degrees   CHL IP OTHER RECOMMENDATIONS 05/16/2015 Recommended Consults -- Oral Care Recommendations Oral care BID Other Recommendations --   CHL IP FOLLOW UP RECOMMENDATIONS 05/16/2015 Follow up Recommendations (No Data)   CHL IP FREQUENCY AND DURATION 05/16/2015 Speech Therapy Frequency (ACUTE ONLY) min 3x week Treatment Duration 2 weeks      CHL IP ORAL PHASE 05/16/2015 Oral Phase Impaired Oral - Pudding Teaspoon -- Oral - Pudding Cup -- Oral - Honey Teaspoon -- Oral - Honey Cup Delayed oral transit;Weak lingual manipulation;Lingual/palatal residue;Decreased bolus cohesion Oral - Nectar Teaspoon  Decreased bolus cohesion;Lingual/palatal residue;Weak lingual manipulation;Delayed oral transit Oral - Nectar Cup Delayed oral transit;Decreased bolus cohesion;Weak lingual manipulation;Lingual/palatal residue Oral - Nectar Straw -- Oral - Thin Teaspoon -- Oral - Thin Cup -- Oral - Thin Straw -- Oral - Puree Delayed oral transit Oral - Mech Soft -- Oral - Regular -- Oral - Multi-Consistency -- Oral - Pill -- Oral Phase - Comment --  CHL IP PHARYNGEAL PHASE 05/16/2015 Pharyngeal Phase Impaired Pharyngeal- Pudding Teaspoon -- Pharyngeal -- Pharyngeal- Pudding Cup -- Pharyngeal -- Pharyngeal- Honey Teaspoon -- Pharyngeal -- Pharyngeal- Honey Cup Delayed swallow initiation-pyriform sinuses;Pharyngeal residue - pyriform;Pharyngeal residue - valleculae;Reduced tongue base retraction;Reduced laryngeal elevation;Penetration/Apiration after swallow Pharyngeal Material enters airway, remains ABOVE vocal cords and not ejected out Pharyngeal- Nectar Teaspoon Delayed swallow initiation-pyriform sinuses;Penetration/Aspiration during swallow;Pharyngeal residue - valleculae;Pharyngeal residue - pyriform;Reduced laryngeal elevation;Reduced anterior laryngeal mobility;Reduced tongue base retraction Pharyngeal Material enters  airway, remains ABOVE vocal cords then ejected out Pharyngeal- Nectar Cup Reduced anterior laryngeal mobility;Reduced laryngeal elevation;Pharyngeal residue - pyriform;Pharyngeal residue - valleculae;Reduced tongue base retraction Pharyngeal -- Pharyngeal- Nectar Straw -- Pharyngeal -- Pharyngeal- Thin Teaspoon -- Pharyngeal -- Pharyngeal- Thin Cup -- Pharyngeal -- Pharyngeal- Thin Straw -- Pharyngeal -- Pharyngeal- Puree Delayed swallow initiation-vallecula Pharyngeal -- Pharyngeal- Mechanical Soft -- Pharyngeal -- Pharyngeal- Regular -- Pharyngeal -- Pharyngeal- Multi-consistency -- Pharyngeal -- Pharyngeal- Pill -- Pharyngeal -- Pharyngeal Comment --  CHL IP CERVICAL ESOPHAGEAL PHASE 05/16/2015 Cervical Esophageal  Phase Impaired Pudding Teaspoon -- Pudding Cup -- Honey Teaspoon -- Honey Cup -- Nectar Teaspoon -- Nectar Cup -- Nectar Straw -- Thin Teaspoon -- Thin Cup -- Thin Straw -- Puree -- Mechanical Soft -- Regular -- Multi-consistency -- Pill -- Cervical Esophageal Comment -- [place] Siren 05/16/2015, 2:56 PM [name] Litaker M.Ed CCC-SLP Pager [phone]               Scheduled Meds: . allopurinol  100 mg Oral BID  . amiodarone  200 mg Oral QHS  . amoxicillin-clavulanate  1 tablet Oral Q12H  . antiseptic oral rinse  7 mL Mouth Rinse q12n4p  . apixaban  2.5 mg Oral BID  . bethanechol  25 mg Oral QID  . budesonide (PULMICORT) nebulizer solution  0.25 mg Nebulization BID  . chlorhexidine  15 mL Mouth Rinse BID  . ferrous sulfate  325 mg Oral Q breakfast  . insulin aspart  0-9 Units Subcutaneous TID WC  . insulin aspart  3 Units Subcutaneous TID WC  . insulin glargine  15 Units Subcutaneous QHS  . isosorbide mononitrate  60 mg Oral Daily  . latanoprost  1 drop Both Eyes QHS  . megestrol  200 mg Oral Daily  . metoprolol tartrate  25 mg Oral Daily  . ramelteon  8 mg Oral QHS  . tamsulosin  0.4 mg Oral QPC supper   Continuous Infusions:    Principal Problem:   HCAP (healthcare-associated pneumonia) Active Problems:   Type 2 diabetes mellitus with peripheral neuropathy (HCC)   Chronic anticoagulation   CKD (chronic kidney disease) stage 4, GFR 15-29 ml/min (HCC)   Chronic diastolic heart failure (HCC)   Dehydration   Chronic systolic CHF (congestive heart failure) (HCC)   SSS (sick sinus syndrome) ([place])   Delirium not superimposed on dementia   Primary gout   Dysphagia, pharyngoesophageal phase   Acute on chronic systolic congestive heart failure (HCC)   SOB (shortness of breath)   Shortness of breath    Time spent: 35 minutes    Barton Dubois  [facility] Pager [phone]. If 7PM-7AM, please contact night-coverage at [email or web address], password [facility] 05/17/2015, 12:08 PM   LOS: 5 days

## 2015-05-18 DIAGNOSIS — I5022 Chronic systolic (congestive) heart failure: Secondary | ICD-10-CM

## 2015-05-18 DIAGNOSIS — Z66 Do not resuscitate: Secondary | ICD-10-CM

## 2015-05-18 DIAGNOSIS — Z515 Encounter for palliative care: Secondary | ICD-10-CM

## 2015-05-18 LAB — GLUCOSE, CAPILLARY
GLUCOSE-CAPILLARY: 243 mg/dL — AB (ref 65–99)
GLUCOSE-CAPILLARY: 237 mg/dL — AB (ref 65–99)
Glucose-Capillary: 219 mg/dL — ABNORMAL HIGH (ref 65–99)
Glucose-Capillary: 280 mg/dL — ABNORMAL HIGH (ref 65–99)

## 2015-05-18 MED ORDER — SIMETHICONE 80 MG PO CHEW
160.0000 mg | CHEWABLE_TABLET | Freq: Four times a day (QID) | ORAL | Status: DC | PRN
  Administered 2015-05-18: 160 mg via ORAL
  Filled 2015-05-18: qty 2

## 2015-05-18 MED ORDER — BISACODYL 10 MG RE SUPP
10.0000 mg | Freq: Once | RECTAL | Status: AC
  Administered 2015-05-18: 10 mg via RECTAL
  Filled 2015-05-18: qty 1

## 2015-05-18 MED ORDER — LORAZEPAM 2 MG/ML IJ SOLN
0.5000 mg | Freq: Once | INTRAMUSCULAR | Status: AC
  Administered 2015-05-18: 0.5 mg via INTRAVENOUS
  Filled 2015-05-18: qty 1

## 2015-05-18 MED ORDER — INSULIN GLARGINE 100 UNIT/ML ~~LOC~~ SOLN
18.0000 [IU] | Freq: Every day | SUBCUTANEOUS | Status: DC
  Administered 2015-05-18: 18 [IU] via SUBCUTANEOUS
  Filled 2015-05-18 (×2): qty 0.18

## 2015-05-18 MED ORDER — SENNOSIDES-DOCUSATE SODIUM 8.6-50 MG PO TABS
2.0000 | ORAL_TABLET | Freq: Two times a day (BID) | ORAL | Status: DC
  Administered 2015-05-18: 2 via ORAL
  Filled 2015-05-18 (×2): qty 2

## 2015-05-18 MED ORDER — MORPHINE SULFATE (CONCENTRATE) 10 MG/0.5ML PO SOLN
5.0000 mg | ORAL | Status: DC | PRN
  Administered 2015-05-18: 5 mg via ORAL
  Filled 2015-05-18: qty 0.5

## 2015-05-18 MED ORDER — INSULIN ASPART 100 UNIT/ML ~~LOC~~ SOLN: 4.0000 [IU] | Freq: Three times a day (TID) | SUBCUTANEOUS | Status: DC

## 2015-05-18 NOTE — Progress Notes (Signed)
   05/18/15 1400  Clinical Encounter Type  Visited With Health care provider (Attempted to visit pt see note)  Visit Type Initial  CH consult by Palliative care; [place] attempted to talk with Pt but observed no reaction or response from pt. [place] talked with RN and Palliative Care team noting pt not being cognitively present. 2:33 PM Gwynn Burly

## 2015-05-18 NOTE — Progress Notes (Signed)
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 PMH significant for CHF, CKD stage 4, HTN, DM2, pacemaker in June, recently admitted for HCAP earlier this month. He was treated with IV abx, switched to levaquin, and discharged to [facility]. At [facility] he was continued on levaquin, but developed acute worsening dyspnea today, sent to ED for respiratory distress and O2 sat in the 80s. Started on treatment for HCAP with broad spectrum antibiotics and after 3days of IV, transition to PO Augmentin. No fever, no WBC's and no frank infiltrates on CXR. Now with frank acute on chronic HF exacerbation and struggling to respond to ongoing diuretics treatment (limited by chronic renal failure). Cardiology is on Board. Palliative care consulted.  Assessment/Plan: Healthcare associated pneumonia/SOB and concerns for aspiration/pneumonitis Patient with episode of hypoxia at SNF Will continue PO Augmentin  Will continue working with flutter valve and pulmicort; no more wheezing  appreciated CXR demonstrating vascular congestion and pleural effusion  No elevation on WBC's and he has remained afebrile. Will follow cardiology rec's for diuretics Will follow clinical response (condition guarded) Per SPL will follow dysphagia 1 and nectar thick liquids  Low back pain Appears to be chronic. Recently seen by neurosurgery and was placed on steroids patient at [facility] for rehabilitation.  Acute on Chronic systolic and diastolic congestive heart failure Last measured EF was about 35-40%.  Thought to be volume depleted by cardiology and given IV fluids on admission.  Now with CXR demonstrating vascular congestion, pleural effusion and beginning of pulmonary edema. On exam until 05/16/15 was very crackly and  with resp distress/orthopnea Has required to be moved to stepdown for BIPAP therapy, secondary to hypoxia Will continue daily weights, low sodium diet and strict intake and output Diuresis by cardiology, appears to be well compensated from a volume standpoint per cardiology, diuresis is on hold.  Chronic kidney disease stage III and hyperkalemia Last Cr continue to be stable at 3.1, will monitor trend    History of sick sinus syndrome, status post pacemaker HR stable; will continue telemetry monitoring    History of paroxysmal atrial fibrillation HR is well controlled Cardiology is on board and will follow their recommendations Will continue amiodarone, metoprolol and anticoagulation with Apixaban for now.  Chronic anemia Stable.  Will Continue to monitor.   History of gout Will continue allopurinol No joint swelling appreciated  Diabetes mellitus type 2 with chronic kidney disease  CBG uncontrolled , will increase Lantus, we'll increase milk coverage, will continue with insulin sliding scale.  History of obstructive uropathy with Foley catheter Continue foley Continue flomax Outpatient follow up with urology   Dysphagia  -s/p MBS -will follow SPL rec's -dysphagia 1 and nectar thick liquids (high risk for aspiration, based on MBS results)  Confusion/delirium  -from hypoxia and PNA -minimize benzo's and sedative medications -depending course might require low dose seroquel   Code Status: DNR Family Communication: wife and daughter  at bedside  Disposition Plan: will remain inpatient; will continue Augmentin PO; SPL has evaluated patient and recommended dysphagia 1 with nectar thick liquids (after MBS). Will continue diuretics therapy as dictated by cardiology service.  Consultants:  Cardiology   Palliative Care  Procedures:  See below for x-ray reports   Antibiotics:  Cefepime 12/29>>05/15/15  Vancomycin 12/29>>05/15/15  Augmentin  05/15/15  HPI/Subjective:  Afebrile. Patient remains intermittently confused, and planes of constipation requirement of BiPAP overnight  objective: Filed Vitals:   05/18/15 0800 05/18/15 1200  BP: 122/64 107/59  Pulse: 65 70  Temp:  99.1 F (37.3 C)  Resp: 13 14    Intake/Output Summary (Last 24 hours) at 05/18/15 1459 Last data filed at 05/18/15 0830  Gross per 24 hour  Intake    400 ml  Output    800 ml  Net   -400 ml   Filed Weights   05/16/15 1906 05/17/15 0503 05/18/15 0600  Weight: 87.3 kg (192 lb 7.4 oz) 84.7 kg (186 lb 11.7 oz) 85.503 kg (188 lb 8 oz)    Exam:   General:  Afebrile, comfortable   Cardiovascular: rate controlled, no rubs or gallops; positive SEM  Respiratory: scattered rhonchi, bibasilar decrease BS and mild crackles; no wheezing   Abdomen: soft, NT, ND, positive BS  Musculoskeletal: no edema, no cyanosis   Data Reviewed: Basic Metabolic Panel:  Recent Labs Lab 04/20/2015 1719 05/13/15 0240 05/15/15 0320 05/16/15 0543  NA 130* 131* 133* 136  K 5.4* 5.1 4.7 5.2*  CL 97* 100* 100* 102  CO2 22 20* 22 20*  GLUCOSE 306* 288* 255* 194*  BUN 40* 38* 43* 48*  CREATININE 3.11* 2.92* 3.15* 3.11*  CALCIUM 9.0 8.9 9.4 9.3   Liver Function Tests:  Recent Labs Lab 04/24/2015 1719  AST 80*  ALT 77*  ALKPHOS 65  BILITOT 1.1  PROT 6.3*  ALBUMIN 2.7*   CBC:  Recent Labs Lab 04/25/2015 1719 05/13/15 0240 05/14/15 0600  WBC 7.3 8.6 9.5  NEUTROABS 5.7  --   --   HGB 11.6* 10.9* 16.2  HCT 34.2* 32.2* 49.5  MCV 98.8 99.1 96.1  PLT 184 191 126*   BNP (last 3 results)  Recent Labs  02/27/15 0924 04/28/15 0324 05/13/2015 1719  BNP 325.4* 470.1* 625.3*    ProBNP (last 3 results)  Recent Labs  07/16/14 1213  PROBNP 460.0*    CBG:  Recent Labs Lab 05/17/15 1122 05/17/15 1620 05/17/15 2126 05/18/15 0834 05/18/15 1128  GLUCAP 188* 246* 249* 219* 243*    Recent Results (from the past 240 hour(s))  Blood culture (routine x  2)     Status: None   Collection Time: 05/02/2015 12:11 PM  Result Value Ref Range Status   Specimen Description BLOOD LEFT HAND  Final   Special Requests BOTTLES DRAWN AEROBIC ONLY 4CC  Final   Culture NO GROWTH 5 DAYS  Final   Report Status 05/17/2015 FINAL  Final  Blood culture (routine x 2)     Status: None   Collection Time: 04/24/2015  6:16 PM  Result Value Ref Range Status   Specimen Description BLOOD RIGHT ARM  Final   Special Requests BOTTLES DRAWN AEROBIC AND ANAEROBIC 5CC  Final   Culture NO GROWTH 5 DAYS  Final   Report Status 05/17/2015 FINAL  Final  MRSA PCR Screening     Status: None   Collection Time: 05/10/2015  9:00 PM  Result Value Ref Range Status   MRSA by PCR NEGATIVE NEGATIVE Final    Comment:        The GeneXpert MRSA Assay (FDA approved for NASAL specimens only), is one component of a comprehensive MRSA colonization surveillance program. It is not intended to diagnose MRSA infection nor to guide or monitor treatment for MRSA infections.   C difficile quick scan w PCR reflex     Status: None  Collection Time: 05/13/15 10:58 PM  Result Value Ref Range Status   C Diff antigen NEGATIVE NEGATIVE Final   C Diff toxin NEGATIVE NEGATIVE Final   C Diff interpretation Negative for toxigenic C. difficile  Final    Studies: Dg Chest Port 1 View  05/17/2015  CLINICAL DATA:  Hypoxia EXAM: PORTABLE CHEST 1 VIEW COMPARISON:  May 16, 2015 FINDINGS: There is persistent interstitial and patchy alveolar edema bilaterally with bilateral effusions. There is cardiomegaly with pulmonary venous hypertension. Pacemaker leads are attached to the right atrium and right ventricle. No adenopathy. No pneumothorax. IMPRESSION: Persistent changes of congestive heart failure, essentially stable compared to 1 day prior. Electronically Signed   By: Lowella Grip III M.D.   On: 05/17/2015 07:50    Scheduled Meds: . allopurinol  100 mg Oral BID  . amiodarone  200 mg Oral QHS  .  amoxicillin-clavulanate  1 tablet Oral Q12H  . antiseptic oral rinse  7 mL Mouth Rinse q12n4p  . apixaban  2.5 mg Oral BID  . bethanechol  25 mg Oral QID  . budesonide (PULMICORT) nebulizer solution  0.25 mg Nebulization BID  . chlorhexidine  15 mL Mouth Rinse BID  . ferrous sulfate  325 mg Oral Q breakfast  . insulin aspart  0-9 Units Subcutaneous TID WC  . insulin aspart  3 Units Subcutaneous TID WC  . insulin glargine  15 Units Subcutaneous QHS  . isosorbide mononitrate  60 mg Oral Daily  . latanoprost  1 drop Both Eyes QHS  . megestrol  200 mg Oral Daily  . metoprolol tartrate  25 mg Oral Daily  . ramelteon  8 mg Oral QHS  . senna-docusate  2 tablet Oral BID  . tamsulosin  0.4 mg Oral QPC supper   Continuous Infusions:    Principal Problem:   HCAP (healthcare-associated pneumonia) Active Problems:   Type 2 diabetes mellitus with peripheral neuropathy (HCC)   Chronic anticoagulation   CKD (chronic kidney disease) stage 4, GFR 15-29 ml/min (HCC)   Chronic diastolic heart failure (HCC)   Dehydration   Chronic systolic CHF (congestive heart failure) (HCC)   SSS (sick sinus syndrome) ([place])   Delirium not superimposed on dementia   Primary gout   Dysphagia, pharyngoesophageal phase   Acute on chronic systolic congestive heart failure (HCC)   SOB (shortness of breath)   Shortness of breath   Acute respiratory failure with hypoxia ([place])   Palliative care encounter   DNR (do not resuscitate)    Time spent: 30 minutes    Abdel Effinger MD [phone] [facility] Pager [phone]. If 7PM-7AM, please contact night-coverage at [email or web address], password [facility] 05/18/2015, 2:59 PM  LOS: 6 days

## 2015-05-18 NOTE — Progress Notes (Signed)
   Long discussion with the family including son Shannahan), and 3 daughters. I recommended converting to comfort care. This can include diuretic/hypertension therapy and treatment for diabetes as needed per oral and subcutaneous routes respectively. Management strategy should be heavily geared towards comfort. No recurrent admissions to the hospital for acute decompensation. I recommended hospice and palliative care in facility if he is unable to return home.  Hospice and Palliative care: Home versus Facility will be dependent upon needs and recommendations from Hospice and Palliative care. The family desires [place] if possible.

## 2015-05-18 NOTE — Progress Notes (Signed)
Inpatient Diabetes Program Recommendations  AACE/ADA: New Consensus Statement on Inpatient Glycemic Control (2015)  Target Ranges:  Prepandial:   less than 140 mg/dL      Peak postprandial:   less than 180 mg/dL (1-2 hours)      Critically ill patients:  140 - 180 mg/dL    Results for Darrell Mulder, MD (MRN [id number]) as of 05/18/2015 13:40  Ref. Range 05/17/2015 07:40 05/17/2015 11:22 05/17/2015 16:20 05/17/2015 21:26  Glucose-Capillary Latest Ref Range: 65-99 mg/dL 201 (H) 188 (H) 246 (H) 249 (H)    Results for [name], ELEDGE, MD (MRN [id number]) as of 05/18/2015 13:40  Ref. Range 05/18/2015 08:34 05/18/2015 11:28  Glucose-Capillary Latest Ref Range: 65-99 mg/dL 219 (H) 243 (H)     Home DM Meds: Lantus 12 units QHS  Novolog SSI  Current Insulin Orders: Lantus 15 units QHS  Novolog Sensitive SSI (0-9 units) TID AC      Novolog 3 units tidwc     MD- Please consider the following in-hospital insulin adjustments:  1. Increase Lantus to 18 units QHS  2. Increase Novolog SSI to Moderate scale (0-15 units) TID AC     --Will follow patient during hospitalization--  Wyn Quaker RN, MSN, CDE Diabetes Coordinator Inpatient Glycemic Control Team Team Pager: [phone] (8a-5p)

## 2015-05-18 NOTE — Progress Notes (Signed)
SUBJECTIVE:  Complains of pain in his anus.  OBJECTIVE:   Vitals:   Filed Vitals:   05/18/15 0600 05/18/15 0637 05/18/15 0800 05/18/15 1040  BP:  119/55 122/64   Pulse:  63 65   Temp:      TempSrc:      Resp:  27 13   Height:      Weight: 188 lb 8 oz (85.503 kg)     SpO2:  99% 95% 94%   I&O's:   Intake/Output Summary (Last 24 hours) at 05/18/15 1236 Last data filed at 05/18/15 0830  Gross per 24 hour  Intake    640 ml  Output    800 ml  Net   -160 ml   TELEMETRY: Reviewed telemetry pt in : Atrial paced rhythm     PHYSICAL EXAM General: Well developed, well nourished, in no acute distress Head:   Normal cephalic and atramatic  Lungs:  basilar crackles bilaterally to auscultation. Heart:   HRRR S1 S2  No JVD.   Abdomen: abdomen soft and non-tender Msk:  Back normal,  Normal strength and tone for age. Extremities:  No edema.   Neuro: Alert and oriented. Psych:  Flat affect, responds slow    LABS: Basic Metabolic Panel:  Recent Labs  05/16/15 0543  NA 136  K 5.2*  CL 102  CO2 20*  GLUCOSE 194*  BUN 48*  CREATININE 3.11*  CALCIUM 9.3   Liver Function Tests: No results for input(s): AST, ALT, ALKPHOS, BILITOT, PROT, ALBUMIN in the last 72 hours. No results for input(s): LIPASE, AMYLASE in the last 72 hours. CBC: No results for input(s): WBC, NEUTROABS, HGB, HCT, MCV, PLT in the last 72 hours. Cardiac Enzymes: No results for input(s): CKTOTAL, CKMB, CKMBINDEX, TROPONINI in the last 72 hours. BNP: Invalid input(s): POCBNP D-Dimer: No results for input(s): DDIMER in the last 72 hours. Hemoglobin A1C: No results for input(s): HGBA1C in the last 72 hours. Fasting Lipid Panel: No results for input(s): CHOL, HDL, LDLCALC, TRIG, CHOLHDL, LDLDIRECT in the last 72 hours. Thyroid Function Tests: No results for input(s): TSH, T4TOTAL, T3FREE, THYROIDAB in the last 72 hours.  Invalid input(s): FREET3 Anemia Panel: No results for input(s): VITAMINB12,  FOLATE, FERRITIN, TIBC, IRON, RETICCTPCT in the last 72 hours. Coag Panel:   Lab Results  Component Value Date   INR 1.42 04/24/2015   INR 1.22 04/18/2015   INR 1.33 06/22/2014    RADIOLOGY: Dg Chest 2 View  05/16/2015  CLINICAL DATA:  Shortness of breath. EXAM: CHEST  2 VIEW COMPARISON:  May 14, 2015. FINDINGS: Stable cardiomediastinal silhouette. Left-sided pacemaker is unchanged in position. No pneumothorax is noted. Mildly increased bilateral perihilar and basilar interstitial densities are noted concerning for pulmonary edema. Stable bilateral pleural effusions are noted. Bony thorax is unremarkable. Contrast is seen in the distal esophagus and stomach. IMPRESSION: Mildly increased bilateral pulmonary edema and pleural effusions are noted. Electronically Signed   By: Marijo Conception, M.D.   On: 05/16/2015 16:20   Dg Chest 2 View  05/14/2015  CLINICAL DATA:  Shortness of breath. History of hypertension and congestive heart failure. EXAM: CHEST  2 VIEW COMPARISON:  05/06/2015 FINDINGS: Dual lead pacer noted. Mild enlargement of the cardiopericardial silhouette, with bilateral indistinct perihilar and basilar airspace opacities favoring airspace edema. Indistinct pulmonary vasculature. Moderate bilateral pleural effusions. Thoracic spondylosis noted. IMPRESSION: 1. Mild enlargement of the cardiopericardial silhouette with increase in pulmonary edema, and moderate bilateral pleural effusions. Electronically Signed  By: Van Clines M.D.   On: 05/14/2015 08:41   Dg Chest 2 View  04/28/2015  CLINICAL DATA:  Bilateral chest tightness. EXAM: CHEST  2 VIEW COMPARISON:  03/11/2015 FINDINGS: Small bilateral pleural effusions similar to previous studies. Focal airspace disease in the right lung base posteriorly suggests focal pneumonia. Left lung is clear. No pneumothorax. Normal heart size and pulmonary vascularity. Cardiac pacemaker. Calcification of the aorta. Degenerative changes in the  spine. IMPRESSION: Focal airspace disease in the right lung base suggesting pneumonia. Small bilateral pleural effusions. Electronically Signed   By: [name] M.D.   On: 04/28/2015 03:35   Ct Lumbar Spine Wo Contrast  04/20/2015  CLINICAL DATA:  Back pain with BILATERAL leg weakness which began after being moved on to x-ray table 2 days ago. EXAM: CT LUMBAR SPINE WITHOUT CONTRAST TECHNIQUE: Multidetector CT imaging of the lumbar spine was performed without intravenous contrast administration. Multiplanar CT image reconstructions were also generated. COMPARISON:  Lumbar radiographs 02/28/2015. CT abdomen and pelvis 04/18/2015. FINDINGS: The patient has undergone previous lumbar fusion. Solid arthrodesis at L2-3 anteriorly. Previous posterior decompression from L2 through L5 with some regrowth of posterior elements. Moderate facet arthropathy throughout. Trace anterolisthesis L4-5, otherwise anatomic alignment. Vacuum phenomenon at L5-S1 is chronic. No acute compression fracture or posttraumatic subluxation. Exuberant endplate reactive changes with osseous spurring. Benign-appearing Schmorl's node L5-S1 on the RIGHT. Shallow calcific protrusion L1-L2. Atherosclerosis. Paravertebral soft tissues unchanged from priors with stable LEFT adrenal nodule. IMPRESSION: No posttraumatic sequelae or concern for spinal infection. Postsurgical changes as described. Electronically Signed   By: Staci Righter M.D.   On: 04/20/2015 10:46   Dg Chest Port 1 View  05/17/2015  CLINICAL DATA:  Hypoxia EXAM: PORTABLE CHEST 1 VIEW COMPARISON:  May 16, 2015 FINDINGS: There is persistent interstitial and patchy alveolar edema bilaterally with bilateral effusions. There is cardiomegaly with pulmonary venous hypertension. Pacemaker leads are attached to the right atrium and right ventricle. No adenopathy. No pneumothorax. IMPRESSION: Persistent changes of congestive heart failure, essentially stable compared to 1 day prior.  Electronically Signed   By: Lowella Grip III M.D.   On: 05/17/2015 07:50   Dg Chest Port 1 View  05/14/2015  CLINICAL DATA:  Acute onset of shortness of breath. Initial encounter. EXAM: PORTABLE CHEST 1 VIEW COMPARISON:  Chest radiograph performed earlier today at 8:14 a.m. FINDINGS: The lungs are well-aerated. Small bilateral pleural effusions are seen. Vascular congestion is noted, with bilateral central airspace opacities, compatible with pulmonary edema. No pneumothorax is identified. The cardiomediastinal silhouette is borderline normal in size. A pacemaker is noted overlying the left chest wall, with leads ending overlying the right atrium and right ventricle. No acute osseous abnormalities are seen. IMPRESSION: Small bilateral pleural effusions seen. Vascular congestion, with bilateral central airspace opacities, compatible with pulmonary edema. Electronically Signed   By: Garald Balding M.D.   On: 05/14/2015 22:03   Dg Chest Portable 1 View  04/17/2015  CLINICAL DATA:  Dyspnea for 1 day. EXAM: PORTABLE CHEST 1 VIEW COMPARISON:  04/28/2015. FINDINGS: Borderline enlarged cardiac silhouette with a mild decrease in size. The interstitial markings remain prominent. Interval ill-defined opacity at both lung bases, greater on the right. Stable left subclavian pacemaker leads. Thoracic spine degenerative changes. IMPRESSION: 1. Bibasilar atelectasis or pneumonia. 2. Small right pleural effusion and possible small left pleural effusion. Electronically Signed   By: Claudie Revering M.D.   On: 04/25/2015 17:40   Dg Swallowing Func-speech Pathology  05/16/2015  Objective  Swallowing Evaluation:   MBS Patient Details Name: LOWE MCBRATNEY, MD MRN: [id number] Date of Birth: Sep 12, 1918 Today's Date: 05/16/2015 Time: SLP Start Time (ACUTE ONLY): 1337-SLP Stop Time (ACUTE ONLY): 1357 SLP Time Calculation (min) (ACUTE ONLY): 20 min Past Medical History: @PMH @ Past Surgical History: Past Surgical History Procedure  Laterality Date . Lumbar laminectomy  1995 . Back surgery   . Total knee arthroplasty Left  . Quadriceps tendon repair   . Cataract extraction   . Carotid endarterectomy   . Yag laser application Right [id number]   Procedure: YAG LASER CAPSULOTOMY OF RIGHT EYE;  Surgeon: [name], MD;  Location: [place];  Service: Ophthalmology;  Laterality: Right; . Tonsillectomy   . Pacemaker insertion     DDD St. Jude PM, initially placed in 1993 by Dr. Nils Pyle. Device upgrade 10/2002 to DDD, by Dr. Rollene Fare complicated by bleeding. . Carotid endarterectomy Right 2006 . Transurethral resection of prostate   . Cardioversion N/A 06/16/2013   Procedure: CARDIOVERSION BEDSIDE;  Surgeon: [name], MD;  Location: [place];  Service: Cardiovascular;  Laterality: N/A; . Pacemaker generator change  12/09/2009   SJM Accent DR RF gen change by Dr [name] . Insert / replace / remove pacemaker     2004  . Transurethral resection of prostate N/A 08/27/2014   Procedure: TRANSURETHRAL RESECTION OF THE PROSTATE WITH GYRUS INSTRUMENTS;  Surgeon: [name], MD;  Location: [facility];  Service: Urology;  Laterality: N/A; . 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 PMH significant for CHF, CKD stage 4, HTN, DM2, pacemaker in June, recently admitted for HCAP earlier this month.  Now admitted with HCAP.  CXR revealed bilateral pleural effusions and pulmonary edema. Concerns for potential aspiration; coughing with meals evening of 12/31. MBS recommended following clinical observation with nectar thick liquids.  Subjective: Pleasant, confused (much different than baseline per [name], caregiver) Assessment / Plan / Recommendation CHL IP CLINICAL IMPRESSIONS 05/16/2015 Therapy Diagnosis Moderate oral phase  dysphagia;Moderate pharyngeal phase dysphagia;Severe pharyngeal phase dysphagia;Mild cervical esophageal phase dysphagia Clinical Impression Decreased oral cohesion, delayed transit and weak lingual to palatal contact leading to mild lingual base residue. Moderate-severe sensorimotor pharyngeal impairments including decreased sensation, reduced tongue base retraction, decreased laryngeal elevation and anterior hyolaryngeal excursion. Mild-max vallecular and pyriform sinsus residue with inability to elicit second swallow despite max verbal and tactile cues (dry spoon). Aspiration suspected after the swallow from max pyriform sinus residue indicated by sudden, strong reflexive cough when fluro turned off. Nectar thick flash penetrated x 1 and penetration with honey thick barium that remained in vestibule (mild) x 1. Pt is at significant aspiration risk given current confused state and inability to follow swallow precautions. Recommend Dys 1 and nectar thick liquids, sit upright, adequate sustained attention during meals, crush pills, No straws, small sips and full supervision.       Impact on safety and function Severe aspiration risk   CHL IP TREATMENT RECOMMENDATION 05/16/2015 Treatment Recommendations Therapy as outlined in treatment plan below   Prognosis 05/16/2015 Prognosis for Safe Diet Advancement (No Data) Barriers to Reach Goals Cognitive deficits Barriers/Prognosis Comment -- CHL IP DIET RECOMMENDATION 05/16/2015 SLP Diet Recommendations Dysphagia 1 (Puree) solids;Nectar thick liquid Liquid Administration via Cup;No straw Medication Administration Crushed with puree Compensations Minimize environmental  distractions;Slow rate;Small sips/bites Postural Changes Seated upright at 90 degrees   CHL IP OTHER RECOMMENDATIONS 05/16/2015 Recommended Consults -- Oral Care Recommendations Oral care BID Other Recommendations --   CHL IP FOLLOW UP RECOMMENDATIONS 05/16/2015 Follow up Recommendations (No Data)   CHL IP FREQUENCY  AND DURATION 05/16/2015 Speech Therapy Frequency (ACUTE ONLY) min 3x week Treatment Duration 2 weeks      CHL IP ORAL PHASE 05/16/2015 Oral Phase Impaired Oral - Pudding Teaspoon -- Oral - Pudding Cup -- Oral - Honey Teaspoon -- Oral - Honey Cup Delayed oral transit;Weak lingual manipulation;Lingual/palatal residue;Decreased bolus cohesion Oral - Nectar Teaspoon Decreased bolus cohesion;Lingual/palatal residue;Weak lingual manipulation;Delayed oral transit Oral - Nectar Cup Delayed oral transit;Decreased bolus cohesion;Weak lingual manipulation;Lingual/palatal residue Oral - Nectar Straw -- Oral - Thin Teaspoon -- Oral - Thin Cup -- Oral - Thin Straw -- Oral - Puree Delayed oral transit Oral - Mech Soft -- Oral - Regular -- Oral - Multi-Consistency -- Oral - Pill -- Oral Phase - Comment --  CHL IP PHARYNGEAL PHASE 05/16/2015 Pharyngeal Phase Impaired Pharyngeal- Pudding Teaspoon -- Pharyngeal -- Pharyngeal- Pudding Cup -- Pharyngeal -- Pharyngeal- Honey Teaspoon -- Pharyngeal -- Pharyngeal- Honey Cup Delayed swallow initiation-pyriform sinuses;Pharyngeal residue - pyriform;Pharyngeal residue - valleculae;Reduced tongue base retraction;Reduced laryngeal elevation;Penetration/Apiration after swallow Pharyngeal Material enters airway, remains ABOVE vocal cords and not ejected out Pharyngeal- Nectar Teaspoon Delayed swallow initiation-pyriform sinuses;Penetration/Aspiration during swallow;Pharyngeal residue - valleculae;Pharyngeal residue - pyriform;Reduced laryngeal elevation;Reduced anterior laryngeal mobility;Reduced tongue base retraction Pharyngeal Material enters airway, remains ABOVE vocal cords then ejected out Pharyngeal- Nectar Cup Reduced anterior laryngeal mobility;Reduced laryngeal elevation;Pharyngeal residue - pyriform;Pharyngeal residue - valleculae;Reduced tongue base retraction Pharyngeal -- Pharyngeal- Nectar Straw -- Pharyngeal -- Pharyngeal- Thin Teaspoon -- Pharyngeal -- Pharyngeal- Thin Cup --  Pharyngeal -- Pharyngeal- Thin Straw -- Pharyngeal -- Pharyngeal- Puree Delayed swallow initiation-vallecula Pharyngeal -- Pharyngeal- Mechanical Soft -- Pharyngeal -- Pharyngeal- Regular -- Pharyngeal -- Pharyngeal- Multi-consistency -- Pharyngeal -- Pharyngeal- Pill -- Pharyngeal -- Pharyngeal Comment --  CHL IP CERVICAL ESOPHAGEAL PHASE 05/16/2015 Cervical Esophageal Phase Impaired Pudding Teaspoon -- Pudding Cup -- Honey Teaspoon -- Honey Cup -- Nectar Teaspoon -- Nectar Cup -- Nectar Straw -- Thin Teaspoon -- Thin Cup -- Thin Straw -- Puree -- Mechanical Soft -- Regular -- Multi-consistency -- Pill -- Cervical Esophageal Comment -- [place] Siren 05/16/2015, 2:56 PM [name] Litaker M.Ed CCC-SLP Pager [phone]              Dg Fluoro Guide Ndl Plcd/bx/inj/loc  04/25/2015  CLINICAL DATA:  Right hip pain. EXAM: RIGHT HIP INJECTION UNDER FLUOROSCOPY FLUOROSCOPY TIME:  Fluoroscopy Time (in minutes and seconds): 1 MINUTE 48 SECONDS PROCEDURE: Informed consent was obtained from Dr. Ellsworth Lennox and his accompanying friend. Dr. Ellsworth Lennox reported that his friend has power of attorney for him. Overlying skin prepped with Betadine, draped in the usual sterile fashion, and infiltrated locally with buffered Lidocaine. Curved 20 gauge spinal needle advanced to the superolateral margin of the right femoral head. 1 ml of Lidocaine injected easily. Diagnostic injection of iodinated contrast demonstrates intra-articular spread without intravascular component. 120mg  Depo-Medrol and 5 ml Sensorcaine 0.5% were then administered. No immediate complication. IMPRESSION: Technically successful right hip injection under fluoroscopy. Dr. Kennon Holter tolerated the procedure well. Electronically Signed   By: Lorriane Shire M.D.   On: 04/25/2015 13:02      ASSESSMENT: Kathyrn Lass:    Chronic systolic and diastolic heart failure: He appears well compensated from a volume standpoint.  Chronic renal  insufficiency: Stable  Discussions with  palliative care will be happening to determine goals of treatment. I agree with [name] that DO NOT RESUSCITATE is appropriate. I agree with [name] that strategy of getting palliative care involved to help the patient hopefully return home if possible makes the most sense. I would not pursue any invasive testing at this time.  Pain medications for his pain management. He was given a suppository as well for his constipation.  Jettie Booze, MD  05/18/2015  12:36 PM

## 2015-05-18 NOTE — Consult Note (Signed)
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 PMH significant for CHF, CKD stage 4, HTN, DM2, pacemaker in June, recently admitted for HCAP earlier this month. He was treated with IV abx, switched to levaquin, and discharged to [facility]. At [facility] he was continued on levaquin, but developed acute worsening dyspnea today, sent to ED for respiratory distress and O2 sat in the 80s. Per family he has been declining both physically and functional over the past several months.  Family is faced with advanced directive decisions and anticipatory care needs.   This NP Wadie Lessen reviewed medical records, received report from team, assessed the patient and then meet at the patient's bedside along with his childen to include his daughters Kris Hartmann, and another daughter whom I don't recall her name  to discuss diagnosis, prognosis, GOC, EOL wishes disposition and options.  Yesterday I spoke to son Ervie and friend Marlyn Corporal and caregiver [name]  A detailed discussion was had today regarding advanced directives.  Concepts specific to code status, artifical feeding and hydration, continued IV antibiotics and rehospitalization was had.  The difference between a aggressive medical intervention path  and a palliative comfort care path for this patient at this time was had.  Values and goals of care important to patient and family were attempted to be elicited.  Concept of Hospice and Palliative Care were discussed  Natural trajectory and expectations at  EOL were discussed.  Questions and concerns addressed.  Hard Choices booklet left for review. Family encouraged to call with questions or concerns.  PMT will continue to support holistically.   Primary Decision Maker: Family as a whole  HCPOA: none docuemnted     Code Status/Advance Care Planning: DNR   - no BiPap, treat dyspnea with prn medications.  This was discussed with Dr [name] at family request and he supports decision for no BiPap and symptom management as needed to enhance comfort      Code Status Orders        Start     Ordered   05/17/15 1451  Do not attempt resuscitation (DNR)   Continuous    Question Answer Comment  In the event of cardiac or respiratory ARREST Do not call a "code blue"   In the event of cardiac or respiratory ARREST Do not perform Intubation, CPR, defibrillation or ACLS   In the event of cardiac or respiratory ARREST Use medication by any route, position, wound care, and other measures to relive pain and suffering. May use oxygen, suction and manual treatment of airway obstruction as needed for comfort.   Comments Patient unable to make rational decision concerning level of care and support.      05/17/15 1451    Advance Directive Documentation        Most Recent Value   Type of Advance Directive  Healthcare Power of Attorney   Pre-existing out of facility DNR order (yellow form or pink MOST form)     "MOST" Form in Place?        Other Directives:None  Symptom Management:   Dyspnea/Pain: Roxanol 5 mg po/sl every  3 hrs prn  Palliative Prophylaxis:   Aspiration, Bowel Regimen, Delirium Protocol, Frequent Pain Assessment, Oral Care and Turn Reposition  Additional Recommendations (Limitations, Scope, Preferences):  -No BiPap  Psycho-social/Spiritual:  Support System: Strong Desire for further Chaplaincy support:yes- consult put in Additional Recommendations: Education on Hospice    Discharge Planning:  Pending, family is  considering a hospice facility, they plan to visit [facility] today.  Plan is to re-meet in the morning at 1000 to further clarify GOC and treatment plan.   Chief Complaint/ Primary Diagnoses: Present on Admission:  . Dehydration . (Resolved) Acute renal failure superimposed on stage 3 chronic kidney disease ([place]) . Chronic diastolic heart failure ([place]) . Chronic systolic CHF (congestive heart failure) ([place]) . CKD (chronic kidney disease) stage 4, GFR 15-29 ml/min (HCC) . HCAP (healthcare-associated pneumonia) . SSS (sick sinus syndrome) ([place]) . Type 2 diabetes mellitus with peripheral neuropathy (HCC) . Delirium not superimposed on dementia  I have reviewed the medical record, interviewed the patient and family, and examined the patient. The following aspects are pertinent.  Past Medical History  Diagnosis Date  . Hyperlipidemia   . Spinal stenosis   . Anemia   . Carotid artery disease ([place])     a. s/p prior carotid endarterectomy.  [name] PVD (peripheral vascular disease) ([place])   . Hyponatremia     Chronic  . CVA (cerebral infarction)     a. When asked to clarify patient says he was told thumb numbness may be TIA. He does not recall formal stroke. CT head results are only available through [facility], not in [facility].  . CKD (chronic kidney disease) stage 4, GFR 15-29 ml/min (HCC)   . Long term (current) use of anticoagulants   . PAF (paroxysmal atrial fibrillation) ([place])     a. Asymptomatic. On Eliquis. CHADSVASC 7.  . Sick sinus syndrome ([place])     With DDD St. Jude PM, initially placed in 1993 by Dr. Nils Pyle. Device upgrade 10/2002 to DDD, by Dr. Rollene Fare complicated by bleeding. Subsequent gen change 2011.  [name] Hypertensive cardiovascular disease   . Chronic diastolic congestive heart failure ([place])     ECHO 05/20/12 LVEF estimated by 2D at 55-60%  . Arthritis   . Gait disorder   . Diabetic peripheral neuropathy associated with type 2 diabetes mellitus ([place])   . Diabetes mellitus (HCC)      insulin dependent  . Elevated troponin I level 06/22/2014    a. 06/2014 felt due to demand ischemia.  . Chronic anticoagulation     For PAF, on Eliquis   . Urinary retention   . Pulmonary hypertension ([place])   . Foot drop, left 07/21/2014  . Asterixis 07/21/2014  . Hypertension   . Presence of permanent cardiac pacemaker   . Bilateral plantar fasciitis   . Pneumonia     hx of pneumonia x 1   . HCAP (healthcare-associated pneumonia) 04/28/2015   Social History   Social History  . Marital Status: Widowed    Spouse Name: N/A  . Number of Children: 7  . Years of Education: [name]   Occupational History  . [name]    Social History Main Topics  . Smoking status: Former Smoker -- 2 years    Types: Pipe, Cigarettes    Quit date: 05/14/1958  . Smokeless tobacco: Never Used  . Alcohol Use: Yes     Comment: Cocktails occasionally  . Drug Use: No  . Sexual Activity: Not Currently   Other Topics Concern  .  None   Social History Narrative   Has a caregiver that lives with him. Still works regularly as a Immunologist      Family History  Problem Relation Age of Onset  . Multiple myeloma Father   . Hypertension Mother   . Healthy Sister   . COPD Sister   . Heart failure Sister    Scheduled Meds: . allopurinol  100 mg Oral BID  . amiodarone  200 mg Oral QHS  . amoxicillin-clavulanate  1 tablet Oral Q12H  . antiseptic oral rinse  7 mL Mouth Rinse q12n4p  . apixaban  2.5 mg Oral BID  . bethanechol  25 mg Oral QID  . bisacodyl  10 mg Rectal Once  . budesonide (PULMICORT) nebulizer solution  0.25 mg Nebulization BID  . chlorhexidine  15 mL Mouth Rinse BID  . ferrous sulfate  325 mg Oral Q breakfast  . insulin aspart  0-9 Units Subcutaneous TID WC  . insulin aspart  3 Units Subcutaneous TID WC  . insulin glargine  15 Units Subcutaneous QHS  . isosorbide mononitrate  60 mg Oral Daily  . latanoprost  1 drop Both Eyes QHS  . megestrol  200 mg Oral Daily  . metoprolol  tartrate  25 mg Oral Daily  . ramelteon  8 mg Oral QHS  . senna-docusate  2 tablet Oral BID  . tamsulosin  0.4 mg Oral QPC supper   Continuous Infusions:  PRN Meds:.acetaminophen, albuterol, dextromethorphan, HYDROcodone-acetaminophen, ondansetron, polyethylene glycol, RESOURCE THICKENUP CLEAR Medications Prior to Admission:  Prior to Admission medications   Medication Sig Start Date End Date Taking? Authorizing Provider  allopurinol (ZYLOPRIM) 100 MG tablet Take 100 mg by mouth 2 (two) times daily.    Yes Historical Provider, [name]  apixaban (ELIQUIS) 2.5 MG TABS tablet Take 1 tablet (2.5 mg total) by mouth 2 (two) times daily. 12/27/14  Yes Belva Crome, [name]  bethanechol (URECHOLINE) 25 MG tablet Take 25 mg by mouth 4 (four) times daily.   Yes Historical Provider, [name]  Carboxymethylcellul-Glycerin 0.5-0.9 % SOLN Place 1 drop into both eyes 2 (two) times daily.    Yes Historical Provider, [name]  ferrous sulfate 325 (65 FE) MG tablet Take 325 mg by mouth daily with breakfast.    Yes Historical Provider, [name]  HYDROcodone-acetaminophen (NORCO/VICODIN) 5-325 MG tablet Take 1-2 tablets by mouth every 4 (four) hours as needed for moderate pain. 05/02/15  Yes Bonnielee Haff, [name]  insulin aspart (NOVOLOG FLEXPEN) 100 UNIT/ML FlexPen Inject 9 Units into the skin 3 (three) times daily with meals. Per sliding scale:  CBG <175 no insulin (unless consuming large amounts of carbs; 6-10 units for CBG >175 based on actual CBG and number of carbs in meal; >325 call [name]   Yes Historical Provider, [name]  insulin glargine (LANTUS) 100 UNIT/ML injection Inject 0.12 mLs (12 Units total) into the skin at bedtime. Or as directed by sliding scale. 05/02/15  Yes Bonnielee Haff, [name]  isosorbide mononitrate (IMDUR) 60 MG 24 hr tablet Take 1 tablet (60 mg total) by mouth daily. 03/07/15  Yes Belva Crome, [name]  megestrol (MEGACE) 400 MG/10ML suspension Take 5 mLs (200 mg total) by mouth daily. 05/02/15  Yes Bonnielee Haff, [name]  Melatonin 5  MG CAPS Take 5 mg by mouth at bedtime as needed (sleep).   Yes Historical Provider, [name]  metoprolol tartrate (LOPRESSOR) 25 MG tablet Take 1 tablet (25 mg total) by mouth daily. 02/28/15  Yes Belva Crome, [name]  nitroGLYCERIN (NITROSTAT) 0.4 MG SL tablet Place 1 tablet (0.4 mg total) under the tongue every 5 (five) minutes as needed for chest pain. 12/03/13  Yes Belva Crome, [name]  ondansetron (ZOFRAN) 4 MG tablet Take 4 mg by mouth every 8 (eight) hours as needed for nausea or vomiting.   Yes Historical Provider, [name]  polyethylene glycol (MIRALAX / GLYCOLAX) packet Take 17 g by mouth 2 (two) times daily.    Yes Historical Provider, [name]  polyvinyl alcohol (LIQUIFILM TEARS) 1.4 % ophthalmic solution Place 1 drop into both eyes 2 (two) times daily. 04/26/15  Yes Maryann Mikhail, DO  potassium chloride SA (K-DUR,KLOR-CON) 20 MEQ tablet Take 1 tablet (20 mEq total) by mouth daily. 09/08/14  Yes Debbe Odea, [name]  Tafluprost 0.0015 % SOLN Place 1 drop into both eyes at bedtime. ZIOPTAN   Yes Historical Provider, [name]  tamsulosin (FLOMAX) 0.4 MG CAPS capsule Take 1 capsule (0.4 mg total) by mouth daily after supper. 04/19/15  Yes Janece Canterbury, [name]  torsemide (DEMADEX) 20 MG tablet Take 3 tablets (60 mg total) by mouth daily. 05/02/15  Yes Bonnielee Haff, [name]  tuberculin 5 UNIT/0.1ML injection Inject 0.1 mLs into the skin once.   Yes Historical Provider, [name]  amiodarone (PACERONE) 200 MG tablet Take 1 tablet (200 mg total) by mouth at bedtime. 09/17/14   Belva Crome, [name]   Allergies  Allergen Reactions  . Statins Other (See Comments)    rhabdomyolisis  . Aggrenox [Aspirin-Dipyridamole Er] Other (See Comments)    Dizziness  . Carvedilol Other (See Comments)    Dizziness  . Claritin [Loratadine] Other (See Comments)    Dizziness & drowsiness   . Cymbalta [Duloxetine Hcl]     Confusion   . Latanoprost Itching    Watery   . Lyrica [Pregabalin] Other (See Comments)    Makes him dizzy. Takes him days to  recover.  . Mucinex [Guaifenesin Er] Other (See Comments)    Dizziness, drowsiness  . Other Other (See Comments)    Preservatives in glaucoma gtts cause eye irritation, has to use preservative free  . Phenergan [Promethazine Hcl] Other (See Comments)    Causes severe confusion  . Plavix [Clopidogrel Bisulfate] Other (See Comments)    Dizziness  . Rapaflo [Silodosin] Other (See Comments)    Dizziness for about 30- 45  Minutes At time of preop appointment on 08/26/2014 patient denies any problems with this medication  . Travatan Z [Travoprost] Other (See Comments)    Eye irritation    Review of Systems  Unable to perform ROS   Physical Exam  Constitutional: He appears well-developed. He appears lethargic.  Cardiovascular: Normal rate, regular rhythm and normal heart sounds.   Respiratory: He has decreased breath sounds in the right lower field and the left lower field.  GI: Soft. Normal appearance.  Musculoskeletal:       Right shoulder: He exhibits decreased strength.  Neurological: He appears lethargic.  Skin: Skin is warm and dry.  -noted skin breaks per EMR    Vital Signs: BP 122/64 mmHg  Pulse 65  Temp(Src) 97.8 F (36.6 C) (Oral)  Resp 13  Ht _0  (1.702 m)  Wt 85.503 kg (188 lb 8 oz)  BMI 29.52 kg/m2  SpO2 95%  SpO2: SpO2: 95 % O2 Device:SpO2: 95 % O2 Flow Rate: .O2 Flow Rate (L/min): 4 L/min  IO: Intake/output summary:  Intake/Output Summary (Last 24 hours) at 05/18/15 0958 Last data filed at 05/18/15 0830  Gross per  24 hour  Intake    640 ml  Output    800 ml  Net   -160 ml    LBM: Last BM Date: 05/16/15 Baseline Weight: Weight: 84 kg (185 lb 3 oz) Most recent weight: Weight: 85.503 kg (188 lb 8 oz)      Palliative Assessment/Data:  Flowsheet Rows        Most Recent Value   Intake Tab    Referral Department  Hospitalist   Unit at Time of Referral  Cardiac/Telemetry Unit   Palliative Care Primary Diagnosis  Cardiac   Date Notified  05/17/15    Palliative Care Type  New Palliative care   Reason for referral  Clarify Goals of Care   Date of Admission  05/02/2015   Date first seen by Palliative Care  05/17/15   # of days Palliative referral response time  0 Day(s)   # of days IP prior to Palliative referral  5   Clinical Assessment    Psychosocial & Spiritual Assessment    Palliative Care Outcomes       Additional Data Reviewed:  CBC:    Component Value Date/Time   WBC 9.5 05/14/2015 0600   WBC 5.0 10/04/2005 0911   HGB 16.2 05/14/2015 0600   HGB 10.9* 10/04/2005 0911   HCT 49.5 05/14/2015 0600   HCT 32.5* 10/04/2005 0911   PLT 126* 05/14/2015 0600   PLT 332 10/04/2005 0911   MCV 96.1 05/14/2015 0600   MCV 90.6 10/04/2005 0911   NEUTROABS 5.7 04/19/2015 1719   NEUTROABS 3.0 10/04/2005 0911   LYMPHSABS 1.0 04/30/2015 1719   LYMPHSABS 1.3 10/04/2005 0911   MONOABS 0.6 04/17/2015 1719   MONOABS 0.5 10/04/2005 0911   EOSABS 0.0 04/15/2015 1719   EOSABS 0.1 10/04/2005 0911   BASOSABS 0.0 04/17/2015 1719   BASOSABS 0.0 10/04/2005 0911   Comprehensive Metabolic Panel:    Component Value Date/Time   NA 136 05/16/2015 0543   K 5.2* 05/16/2015 0543   CL 102 05/16/2015 0543   CO2 20* 05/16/2015 0543   BUN 48* 05/16/2015 0543   CREATININE 3.11* 05/16/2015 0543   CREATININE 3.45* 04/15/2015 1121   GLUCOSE 194* 05/16/2015 0543   CALCIUM 9.3 05/16/2015 0543   AST 80* 04/28/2015 1719   ALT 77* 04/30/2015 1719   ALKPHOS 65 05/06/2015 1719   BILITOT 1.1 04/17/2015 1719   PROT 6.3* 05/13/2015 1719   ALBUMIN 2.7* 05/10/2015 1719   Discussed with Dr Waldron Labs  Time In: 1030 Time Out: 1200 Time Total: 90  min Greater than 50%  of this time was spent counseling and coordinating care related to the above assessment and plan.  Signed by: Wadie Lessen, NP  Knox Royalty, NP  05/18/2015, 9:58 AM  Please contact Palliative Medicine Team phone at [phone] for questions and concerns.

## 2015-05-18 NOTE — Progress Notes (Signed)
Speech Language Pathology Treatment: Dysphagia  Patient Details Name: Darrell DRAYTON, MD MRN: [id number] DOB: 03-19-1919 Today's Date: 05/18/2015 Time: [id number] SLP Time Calculation (min) (ACUTE ONLY): 8 min  Assessment / Plan / Recommendation Clinical Impression  Pt consumed nectar thick liquids via spoon and cup with audible swallows noted. Pt not vocalizing much this afternoon, making assessment of vocal quality limited; however no overt coughing or throat clearing was observed. Pt was lethargic and not orally accepting offerings of purees, therefore trials were stopped so he could rest. Caregiver not present for education today. Recommend to continue Dys 1 diet an nectar thick liquids. Will follow along for tolerance, education, and GOC pending palliative care consult.   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 PMH significant for CHF, CKD stage 4, HTN, DM2, pacemaker in June, recently admitted for HCAP earlier this month.  Now admitted with HCAP.  CXR revealed bilateral pleural effusions and pulmonary edema. Concerns for potential aspiration; coughing with meals evening of 12/31. MBS recommended following clinical observation with nectar thick liquids.       SLP Plan  Continue with current plan of care     Recommendations  Diet recommendations: Dysphagia 1 (puree);Nectar-thick liquid Liquids provided via: Cup;No straw Medication Administration: Crushed with puree Supervision: Full supervision/cueing for compensatory strategies;Staff to assist with self feeding Compensations: Minimize environmental distractions;Slow rate;Small sips/bites Postural Changes and/or Swallow Maneuvers: Seated upright 90 degrees;Upright 30-60 min after meal              Oral Care Recommendations: Oral care BID Follow up Recommendations:  (tba) Plan: Continue with current plan of care   Germain Osgood, M.A. CCC-SLP [phone]  Germain Osgood 05/18/2015, 2:13 PM

## 2015-05-19 DIAGNOSIS — R0600 Dyspnea, unspecified: Secondary | ICD-10-CM

## 2015-05-19 DIAGNOSIS — R0602 Shortness of breath: Secondary | ICD-10-CM

## 2015-05-19 LAB — GLUCOSE, CAPILLARY
GLUCOSE-CAPILLARY: 232 mg/dL — AB (ref 65–99)
Glucose-Capillary: 285 mg/dL — ABNORMAL HIGH (ref 65–99)

## 2015-05-19 MED ORDER — MORPHINE SULFATE (PF) 2 MG/ML IV SOLN
1.0000 mg | INTRAVENOUS | Status: DC | PRN
Start: 2015-05-19 — End: 2015-05-19
  Filled 2015-05-19: qty 1

## 2015-05-19 MED ORDER — LORAZEPAM 2 MG/ML IJ SOLN
0.5000 mg | Freq: Once | INTRAMUSCULAR | Status: AC
  Administered 2015-05-19: 0.5 mg via INTRAVENOUS
  Filled 2015-05-19: qty 1

## 2015-05-19 MED ORDER — FUROSEMIDE 10 MG/ML IJ SOLN
80.0000 mg | Freq: Once | INTRAMUSCULAR | Status: AC
  Administered 2015-05-19: 80 mg via INTRAVENOUS
  Filled 2015-05-19: qty 8

## 2015-05-19 MED ORDER — MORPHINE SULFATE (PF) 2 MG/ML IV SOLN
2.0000 mg | INTRAVENOUS | Status: DC | PRN
  Administered 2015-05-19 – 2015-05-20 (×6): 2 mg via INTRAVENOUS
  Filled 2015-05-19 (×6): qty 1

## 2015-05-19 MED ORDER — LORAZEPAM 2 MG/ML IJ SOLN
1.0000 mg | INTRAMUSCULAR | Status: DC | PRN
  Administered 2015-05-19 – 2015-05-20 (×3): 1 mg via INTRAVENOUS
  Filled 2015-05-19 (×3): qty 1

## 2015-05-19 NOTE — Progress Notes (Signed)
Chaplain Note:   i was referred for family support. I met with three of the patient's daughter's providing emotional/grief support. Daughter's spoke affectionately of their father and mother ( who died several years ago ) as loving parents whose home was always open to whoever the children brought home. They noted that had a difficult last night feeling agitated.They are accepting of his current, declining medical condition and desirous of comfort care. They noted that they were hoping to have him transferred to [facility] and were waiting to talk with the Palliative care doctor.  Dr. Kennon Holter and his family are [employer]ians and wished to have his priest visit him.  I made contact with the Rev. Marcy Salvo his priest from the [facility] who came to be with them.  I will follow up with the family in the AM.   Sue Lush [phone].

## 2015-05-19 NOTE — Progress Notes (Signed)
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 PMH significant for CHF, CKD stage 4, HTN, DM2, pacemaker in June, recently admitted for HCAP earlier this month. He was treated with IV abx, switched to levaquin, and discharged to [facility]. [facility], but developed acute worsening dyspnea today, sent to ED for respiratory distress and O2 sat in the 80s. Started on treatment for HCAP with broad spectrum antibiotics and after 3days of IV, transition to PO Augmentin. No fever, no WBC's and no frank infiltrates on CXR. Has  acute on chronic HF exacerbation and struggling to respond to ongoing diuretics treatment (limited by chronic renal failure). Cardiology is on Board. Palliative care consulted. At this point after family discussion with patient primary cardiologist [name], and palliative care, our goal is towards comfort measures.  Assessment/Plan: Healthcare associated pneumonia/SOB and concerns for aspiration/pneumonitis - Patient with episode of hypoxia at SNF, treated with by mouth Augmentin for presumed pneumonia. - Currently lethargic to work with flutter valve CXR demonstrating vascular congestion and pleural effusion  - No elevation on WBC's and he has remained afebrile. - Per SPL will follow dysphagia 1 and nectar thick liquids , given he is more lethargic we'll keep nothing by mouth.  Acute hypoxic respiratory failure  - This is secondary to acute on chronic combined systolic and diastolic CHF, patient requiring BiPAP initially, currently goals of care towards comfort measures, so no BiPAP, on when necessary morphine.  Low back pain - Appears to be chronic. Recently seen by neurosurgery and was placed on steroids patient at [facility] for  rehabilitation.  Acute on Chronic systolic and diastolic congestive heart failure - Last measured EF was about 35-40%.  - CXR demonstrating vascular congestion, pleural effusion and beginning of pulmonary edema. On exam until 05/16/15 was very crackly and with resp distress/orthopnea, Has required to be moved to stepdown for BIPAP therapy, secondary to hypoxia, and started on diuresis which was managed by cardiology. - At this point we'll continue with symptomatic management with when necessary morphine.  Chronic kidney disease stage III and hyperkalemia - No further labs  History of sick sinus syndrome,  - status post pacemaker   History of paroxysmal atrial fibrillation - We'll discontinue anticoagulation with Apixaban .  Chronic anemia  History of gout Will continue allopurinol  Diabetes mellitus type 2 with chronic kidney disease  CBG uncontrolled , discontinue insulin to minimize comfortable fingersticks, as well patient is nothing by mouth.  History of obstructive uropathy with Foley catheter Continue foley  Dysphagia  -s/p MBS -dysphagia 1 and nectar thick liquids (high risk for aspiration, based on MBS results)  Confusion/delirium   Code Status: DNR Family Communication: wife and daughter  at bedside  Disposition Plan:  was seen by palliative care, goals of care currently is patient comfort , plan for discharge to [facility] home tomorrow.  Consultants:  Cardiology   Palliative Care  Procedures:  See below for x-ray reports   Antibiotics:  Cefepime 12/29>>05/15/15  Vancomycin 12/29>>05/15/15  Augmentin 05/15/15>>05/19/2015  HPI/Subjective: Lethargic, minimally responsive, can't provide any complaints.  objective: Filed Vitals:   05/19/15 0613 05/19/15 0803  BP: 122/62 122/62  Pulse: 66 64  Temp:  97.9 F (36.6 C)  Resp: 23 22    Intake/Output Summary (Last 24  hours) at 05/19/15 1515 Last data filed at 05/19/15 [id number]  Gross per 24 hour  Intake     60 ml   Output    426 ml  Net   -366 ml   Filed Weights   05/17/15 0503 05/18/15 0600 05/19/15 [id number]  Weight: 84.7 kg (186 lb 11.7 oz) 85.503 kg (188 lb 8 oz) 85.14 kg (187 lb 11.2 oz)    Exam:   General:  Afebrile, lethargic  Cardiovascular: rate controlled, no rubs or gallops; positive SEM  Respiratory: scattered rhonchi, diminished air entry bilaterally; no wheezing   Abdomen: soft, NT, ND, positive BS  Musculoskeletal: no edema,   Data Reviewed: Basic Metabolic Panel:  Recent Labs Lab 04/26/2015 1719 05/13/15 0240 05/15/15 0320 05/16/15 0543  NA 130* 131* 133* 136  K 5.4* 5.1 4.7 5.2*  CL 97* 100* 100* 102  CO2 22 20* 22 20*  GLUCOSE 306* 288* 255* 194*  BUN 40* 38* 43* 48*  CREATININE 3.11* 2.92* 3.15* 3.11*  CALCIUM 9.0 8.9 9.4 9.3   Liver Function Tests:  Recent Labs Lab 05/03/2015 1719  AST 80*  ALT 77*  ALKPHOS 65  BILITOT 1.1  PROT 6.3*  ALBUMIN 2.7*   CBC:  Recent Labs Lab 05/03/2015 1719 05/13/15 0240 05/14/15 0600  WBC 7.3 8.6 9.5  NEUTROABS 5.7  --   --   HGB 11.6* 10.9* 16.2  HCT 34.2* 32.2* 49.5  MCV 98.8 99.1 96.1  PLT 184 191 126*   BNP (last 3 results)  Recent Labs  02/27/15 0924 04/28/15 0324 05/11/2015 1719  BNP 325.4* 470.1* 625.3*    ProBNP (last 3 results)  Recent Labs  07/16/14 1213  PROBNP 460.0*    CBG:  Recent Labs Lab 05/18/15 0834 05/18/15 1128 05/18/15 1642 05/18/15 1943 05/19/15 0728  GLUCAP 219* 243* 237* 280* 285*    Recent Results (from the past 240 hour(s))  Blood culture (routine x 2)     Status: None   Collection Time: 05/10/2015 12:11 PM  Result Value Ref Range Status   Specimen Description BLOOD LEFT HAND  Final   Special Requests BOTTLES DRAWN AEROBIC ONLY 4CC  Final   Culture NO GROWTH 5 DAYS  Final   Report Status 05/17/2015 FINAL  Final  Blood culture (routine x 2)     Status: None   Collection Time: 05/13/2015  6:16 PM  Result Value Ref Range Status   Specimen Description BLOOD RIGHT  ARM  Final   Special Requests BOTTLES DRAWN AEROBIC AND ANAEROBIC 5CC  Final   Culture NO GROWTH 5 DAYS  Final   Report Status 05/17/2015 FINAL  Final  MRSA PCR Screening     Status: None   Collection Time: 04/28/2015  9:00 PM  Result Value Ref Range Status   MRSA by PCR NEGATIVE NEGATIVE Final    Comment:        The GeneXpert MRSA Assay (FDA approved for NASAL specimens only), is one component of a comprehensive MRSA colonization surveillance program. It is not intended to diagnose MRSA infection nor to guide or monitor treatment for MRSA infections.   C difficile quick scan w PCR reflex     Status: None   Collection Time: 05/13/15 10:58 PM  Result Value Ref Range Status   C Diff antigen NEGATIVE NEGATIVE Final   C Diff toxin NEGATIVE NEGATIVE Final   C Diff interpretation Negative for toxigenic C. difficile  Final    Studies: No results found.  Scheduled Meds: .  antiseptic oral rinse  7 mL Mouth Rinse q12n4p  . budesonide (PULMICORT) nebulizer solution  0.25 mg Nebulization BID  . chlorhexidine  15 mL Mouth Rinse BID  . insulin aspart  0-9 Units Subcutaneous TID WC  . insulin aspart  4 Units Subcutaneous TID WC  . insulin glargine  18 Units Subcutaneous QHS  . latanoprost  1 drop Both Eyes QHS  . ramelteon  8 mg Oral QHS   Continuous Infusions:    Principal Problem:   HCAP (healthcare-associated pneumonia) Active Problems:   Type 2 diabetes mellitus with peripheral neuropathy (HCC)   Chronic anticoagulation   CKD (chronic kidney disease) stage 4, GFR 15-29 ml/min (HCC)   Chronic diastolic heart failure (HCC)   Dehydration   Chronic systolic CHF (congestive heart failure) (HCC)   SSS (sick sinus syndrome) ([place])   Delirium not superimposed on dementia   Primary gout   Dysphagia, pharyngoesophageal phase   Acute on chronic systolic congestive heart failure (HCC)   SOB (shortness of breath)   Shortness of breath   Acute respiratory failure with hypoxia ([place])    Palliative care encounter   DNR (do not resuscitate)    Time spent: 25 minutes    [facility], [employer] [employer] MD [phone] [facility] Pager [phone]. If 7PM-7AM, please contact night-coverage at [email or web address], password [facility] 05/19/2015, 3:15 PM  LOS: 7 days

## 2015-05-19 NOTE — Consult Note (Signed)
[place] Liaison:  Received request from [place] for family interest in [facility]. Chart reviewed and met with family at bedside. They toured [employer]. Daughter Vivien Rota completed paper work for transfer tomorrow 2015-06-03. Dr. Orpah Melter to assume care per family request.   Please fax discharge summary to [phone].  RN please call report to [phone].  Family requesting that patient be transported as early as possible tomorrow morning. Have made CSW aware.   Thank you.  Erling Conte, [place]

## 2015-05-19 NOTE — Progress Notes (Signed)
Receive call per RN that pt o2 saturations were declining and increase WOB. On assessment noted, coarse crackles bilaterally throughout all lungs fields. noteed WOB. Pt was unable to follow commands. I encouraged patient to cough for me, he sounded congested but was unable to adequately perform a strong cough. Neb treatment given for increase WOB, no wheezing noted. Pt o2 saturations are acceptable at this time with treatment and NRB. RN at bedside getting ready to administer Lasix. RN noted to me that pt has CHF.

## 2015-05-19 NOTE — Clinical Social Work Note (Signed)
[place] able to admit patient in the AM. CSW has requested MD's assistance with getting the patient transferred as soon as possible in the AM.    Liz Beach MSW, [place], [place], [id number]

## 2015-05-19 NOTE — Progress Notes (Signed)
Daily Progress Note   Patient Name: Darrell Mulder, MD       Date: 05/19/2015 DOB: Sep 24, 1918  Age: 82 y.o. MRN#: [id number] Attending Physician: Albertine Patricia, MD Primary Care Physician: [name], MD Admit Date: 05/11/2015  Reason for Consultation/Follow-up: Establishing GOC and symptom managmetn  Subjective:  -continued physical decline as patient transitions at EOL  -family has made decision for a full comfort path, hopeful to secure [facility] Stay: 7 days  Current Medications: Scheduled Meds:  . antiseptic oral rinse  7 mL Mouth Rinse q12n4p  . budesonide (PULMICORT) nebulizer solution  0.25 mg Nebulization BID  . chlorhexidine  15 mL Mouth Rinse BID  . insulin aspart  0-9 Units Subcutaneous TID WC  . insulin aspart  4 Units Subcutaneous TID WC  . insulin glargine  18 Units Subcutaneous QHS  . latanoprost  1 drop Both Eyes QHS  . ramelteon  8 mg Oral QHS    Continuous Infusions:    PRN Meds: acetaminophen, albuterol, morphine injection, morphine CONCENTRATE, ondansetron, RESOURCE THICKENUP CLEAR, simethicone  Physical Exam: Physical Exam  Constitutional: He appears listless. He appears ill.  HENT:  Mouth/Throat: Mucous membranes are normal.  -audible throat secretions  Cardiovascular: Tachycardia present.   Pulmonary/Chest: He has decreased breath sounds in the right lower field and the left lower field.  Neurological: He appears listless.  Skin: Skin is warm and dry.                Vital Signs: BP 122/62 mmHg  Pulse 64  Temp(Src) 97.9 F (36.6 C) (Axillary)  Resp 22  Ht 5\' 7"  (1.702 m)  Wt 85.14 kg (187 lb 11.2 oz)  BMI 29.39 kg/m2  SpO2 98% SpO2: SpO2: 98 % O2 Device: O2 Device: NRB O2 Flow Rate: O2 Flow Rate (L/min):  5 L/min  Intake/output summary:  Intake/Output Summary (Last 24 hours) at 05/19/15 1007 Last data filed at 05/19/15 0624  Gross per 24 hour  Intake    120 ml  Output    427 ml  Net   -307 ml   LBM: Last BM Date: 05/18/15 Baseline Weight: Weight: 84 kg (185 lb 3 oz) Most recent weight: Weight: 85.14 kg (187 lb 11.2 oz)       Palliative Assessment/Data:  Flowsheet Rows        Most Recent Value   Intake Tab    Referral Department  Hospitalist   Unit at Time of Referral  Cardiac/Telemetry Unit   Palliative Care Primary Diagnosis  Cardiac   Date Notified  05/17/15   Palliative Care Type  New Palliative care   Reason for referral  Clarify Goals of Care   Date of Admission  04/29/2015   Date first seen by Palliative Care  05/17/15   # of days Palliative referral response time  0 Day(s)   # of days IP prior to Palliative referral  5   Clinical Assessment    Psychosocial & Spiritual Assessment    Palliative Care Outcomes       Additional Data Reviewed: CBC    Component Value Date/Time   WBC 9.5 05/14/2015 0600   WBC 5.0 10/04/2005 0911   RBC 5.15 05/14/2015 0600   RBC 3.59* 10/04/2005 0911   HGB 16.2 05/14/2015 0600   HGB 10.9* 10/04/2005 0911   HCT 49.5 05/14/2015 0600   HCT 32.5* 10/04/2005 0911   PLT 126* 05/14/2015 0600   PLT 332 10/04/2005 0911   MCV 96.1 05/14/2015 0600   MCV 90.6 10/04/2005 0911   MCH 31.5 05/14/2015 0600   MCH 30.5 10/04/2005 0911   MCHC 32.7 05/14/2015 0600   MCHC 33.7 10/04/2005 0911   RDW 16.5* 05/14/2015 0600   RDW 13.8 10/04/2005 0911   LYMPHSABS 1.0 04/29/2015 1719   LYMPHSABS 1.3 10/04/2005 0911   MONOABS 0.6 04/24/2015 1719   MONOABS 0.5 10/04/2005 0911   EOSABS 0.0 05/02/2015 1719   EOSABS 0.1 10/04/2005 0911   BASOSABS 0.0 05/14/2015 1719   BASOSABS 0.0 10/04/2005 0911    CMP     Component Value Date/Time   NA 136 05/16/2015 0543   K 5.2* 05/16/2015 0543   CL 102 05/16/2015 0543   CO2 20* 05/16/2015 0543   GLUCOSE 194*  05/16/2015 0543   BUN 48* 05/16/2015 0543   CREATININE 3.11* 05/16/2015 0543   CREATININE 3.45* 04/15/2015 1121   CALCIUM 9.3 05/16/2015 0543   PROT 6.3* 05/05/2015 1719   ALBUMIN 2.7* 04/15/2015 1719   AST 80* 04/15/2015 1719   ALT 77* 05/11/2015 1719   ALKPHOS 65 04/27/2015 1719   BILITOT 1.1 04/23/2015 1719   GFRNONAA 16* 05/16/2015 0543   GFRAA 18* 05/16/2015 0543       Problem List:  Patient Active Problem List   Diagnosis Date Noted  . Palliative care encounter 05/18/2015  . DNR (do not resuscitate) 05/18/2015  . Acute respiratory failure with hypoxia ([place])   . Shortness of breath   . Dysphagia, pharyngoesophageal phase   . Acute on chronic systolic congestive heart failure ([place])   . SOB (shortness of breath)   . Primary gout   . Delirium not superimposed on dementia 04/20/2015  . Failure to thrive in adult   . Pressure ulcer 04/29/2015  . HCAP (healthcare-associated pneumonia) 04/28/2015  . Chest pain 04/28/2015  . Elevated troponin 04/28/2015  . Pain in the chest   . Osteoarthritis of hip 04/25/2015  . Abnormality of gait 04/21/2015  . Back pain 04/20/2015  . Severe comorbid illness 04/20/2015  . Gait abnormality 04/20/2015  . Low back pain   . Lactic acidosis 04/18/2015  . Chronic systolic CHF (congestive heart failure) ([place]) 04/18/2015  . Constipation 04/18/2015  . SSS (sick sinus syndrome) ([place]) 04/18/2015  . Bilateral plantar fasciitis 03/01/2015  . Bilateral leg  and foot pain 02/27/2015  . Foot pain, bilateral 02/27/2015  . Dehydration 10/04/2014  . Vertigo 10/04/2014  . Urinary retention due to benign prostatic hyperplasia 08/27/2014  . Foot drop, left 07/21/2014  . Leg weakness 07/16/2014  . Chronic diastolic heart failure ([place]) 04/16/2014  . Essential hypertension   . Gait disorder 03/31/2014  . Solitary pulmonary nodule 10/09/2013  . CKD (chronic kidney disease) stage 4, GFR 15-29 ml/min (HCC)   . Long term current use of amiodarone  06/25/2013  . Chronic anticoagulation 04/03/2013    Class: Chronic  . Pacemaker - St Jude 04/03/2013    Class: Chronic  . Paroxysmal atrial fibrillation, DCCV 06/16/13 08/22/2012    Class: Chronic  . Hypertensive cardiovascular disease   . Type 2 diabetes mellitus with peripheral neuropathy [facility])      Palliative Care Assessment & Plan    1.Code Status:  DNR/DNI    Code Status Orders        Start     Ordered   05/17/15 1451  Do not attempt resuscitation (DNR)   Continuous    Question Answer Comment  In the event of cardiac or respiratory ARREST Do not call a "code blue"   In the event of cardiac or respiratory ARREST Do not perform Intubation, CPR, defibrillation or ACLS   In the event of cardiac or respiratory ARREST Use medication by any route, position, wound care, and other measures to relive pain and suffering. May use oxygen, suction and manual treatment of airway obstruction as needed for comfort.   Comments Patient unable to make rational decision concerning level of care and support.      05/17/15 1451    Advance Directive Documentation        Most Recent Value   Type of Advance Directive  Healthcare Power of Attorney   Pre-existing out of facility DNR order (yellow form or pink MOST form)     "MOST" Form in Place?         2. Goals of Care/Additional Recommendations:  Focus is comfort and dignity, no further life prolonging measures, minimize medications, symptom managment  Desire for further Chaplaincy support: yes and family has strong community church support  3. Symptom Management:      1. Dyspnea/Pain:  Morphine 2 mg IV every 1 hr prn      2.  Agitation: Ativan 1 mg IV every 4 hrs prn   4. Prognosis: < 2 weeks  5. Discharge Planning:  Hospice facility-will write for choice   Care plan was discussed SW   Thank you for allowing the Palliative Medicine Team to assist in the care of this patient.   Time In: 1000 Time Out: 1035 Total Time 35 min  Prolonged Time Billed  no        Knox Royalty, NP  05/19/2015, 10:07 AM  Please contact Palliative Medicine Team phone at [phone] for questions and concerns.

## 2015-05-19 NOTE — Progress Notes (Signed)
SUBJECTIVE:  Too sleepy to answer questions  OBJECTIVE:   Vitals:   Filed Vitals:   05/19/15 [id number] 05/19/15 0654 05/19/15 0803 05/19/15 1021  BP:   122/62   Pulse:   64   Temp:   97.9 F (36.6 C)   TempSrc:   Axillary   Resp:   22   Height:      Weight: 187 lb 11.2 oz (85.14 kg)     SpO2:  94% 98% 98%   I&O's:   Intake/Output Summary (Last 24 hours) at 05/19/15 1139 Last data filed at 05/19/15 [id number]  Gross per 24 hour  Intake    120 ml  Output    427 ml  Net   -307 ml        PHYSICAL EXAM General: Well developed, well nourished, in no acute distress Head:   Normal cephalic and atramatic  Lungs:   Basilar crackles bilaterally to auscultation. Heart:   HRRR S1 S2  No JVD. 3/6 systolic murmur  Abdomen: abdomen soft and non-tender Msk:  Back normal,  Normal strength and tone for age. Extremities:   No edema.   Neuro: sleepy. Psych:  Unable to assess Skin: No rash   LABS: Basic Metabolic Panel: No results for input(s): NA, K, CL, CO2, GLUCOSE, BUN, CREATININE, CALCIUM, MG, PHOS in the last 72 hours. Liver Function Tests: No results for input(s): AST, ALT, ALKPHOS, BILITOT, PROT, ALBUMIN in the last 72 hours. No results for input(s): LIPASE, AMYLASE in the last 72 hours. CBC: No results for input(s): WBC, NEUTROABS, HGB, HCT, MCV, PLT in the last 72 hours. Cardiac Enzymes: No results for input(s): CKTOTAL, CKMB, CKMBINDEX, TROPONINI in the last 72 hours. BNP: Invalid input(s): POCBNP D-Dimer: No results for input(s): DDIMER in the last 72 hours. Hemoglobin A1C: No results for input(s): HGBA1C in the last 72 hours. Fasting Lipid Panel: No results for input(s): CHOL, HDL, LDLCALC, TRIG, CHOLHDL, LDLDIRECT in the last 72 hours. Thyroid Function Tests: No results for input(s): TSH, T4TOTAL, T3FREE, THYROIDAB in the last 72 hours.  Invalid input(s): FREET3 Anemia Panel: No results for input(s): VITAMINB12, FOLATE, FERRITIN, TIBC, IRON, RETICCTPCT in the last 72  hours. Coag Panel:   Lab Results  Component Value Date   INR 1.42 05/02/2015   INR 1.22 04/18/2015   INR 1.33 06/22/2014    RADIOLOGY: Dg Chest 2 View  05/16/2015  CLINICAL DATA:  Shortness of breath. EXAM: CHEST  2 VIEW COMPARISON:  May 14, 2015. FINDINGS: Stable cardiomediastinal silhouette. Left-sided pacemaker is unchanged in position. No pneumothorax is noted. Mildly increased bilateral perihilar and basilar interstitial densities are noted concerning for pulmonary edema. Stable bilateral pleural effusions are noted. Bony thorax is unremarkable. Contrast is seen in the distal esophagus and stomach. IMPRESSION: Mildly increased bilateral pulmonary edema and pleural effusions are noted. Electronically Signed   By: Marijo Conception, M.D.   On: 05/16/2015 16:20   Dg Chest 2 View  05/14/2015  CLINICAL DATA:  Shortness of breath. History of hypertension and congestive heart failure. EXAM: CHEST  2 VIEW COMPARISON:  04/22/2015 FINDINGS: Dual lead pacer noted. Mild enlargement of the cardiopericardial silhouette, with bilateral indistinct perihilar and basilar airspace opacities favoring airspace edema. Indistinct pulmonary vasculature. Moderate bilateral pleural effusions. Thoracic spondylosis noted. IMPRESSION: 1. Mild enlargement of the cardiopericardial silhouette with increase in pulmonary edema, and moderate bilateral pleural effusions. Electronically Signed   By: Van Clines M.D.   On: 05/14/2015 08:41   Dg Chest 2 View  04/28/2015  CLINICAL DATA:  Bilateral chest tightness. EXAM: CHEST  2 VIEW COMPARISON:  03/11/2015 FINDINGS: Small bilateral pleural effusions similar to previous studies. Focal airspace disease in the right lung base posteriorly suggests focal pneumonia. Left lung is clear. No pneumothorax. Normal heart size and pulmonary vascularity. Cardiac pacemaker. Calcification of the aorta. Degenerative changes in the spine. IMPRESSION: Focal airspace disease in the right lung  base suggesting pneumonia. Small bilateral pleural effusions. Electronically Signed   By: [name] M.D.   On: 04/28/2015 03:35   Ct Lumbar Spine Wo Contrast  04/20/2015  CLINICAL DATA:  Back pain with BILATERAL leg weakness which began after being moved on to x-ray table 2 days ago. EXAM: CT LUMBAR SPINE WITHOUT CONTRAST TECHNIQUE: Multidetector CT imaging of the lumbar spine was performed without intravenous contrast administration. Multiplanar CT image reconstructions were also generated. COMPARISON:  Lumbar radiographs 02/28/2015. CT abdomen and pelvis 04/18/2015. FINDINGS: The patient has undergone previous lumbar fusion. Solid arthrodesis at L2-3 anteriorly. Previous posterior decompression from L2 through L5 with some regrowth of posterior elements. Moderate facet arthropathy throughout. Trace anterolisthesis L4-5, otherwise anatomic alignment. Vacuum phenomenon at L5-S1 is chronic. No acute compression fracture or posttraumatic subluxation. Exuberant endplate reactive changes with osseous spurring. Benign-appearing Schmorl's node L5-S1 on the RIGHT. Shallow calcific protrusion L1-L2. Atherosclerosis. Paravertebral soft tissues unchanged from priors with stable LEFT adrenal nodule. IMPRESSION: No posttraumatic sequelae or concern for spinal infection. Postsurgical changes as described. Electronically Signed   By: Staci Righter M.D.   On: 04/20/2015 10:46   Dg Chest Port 1 View  05/17/2015  CLINICAL DATA:  Hypoxia EXAM: PORTABLE CHEST 1 VIEW COMPARISON:  May 16, 2015 FINDINGS: There is persistent interstitial and patchy alveolar edema bilaterally with bilateral effusions. There is cardiomegaly with pulmonary venous hypertension. Pacemaker leads are attached to the right atrium and right ventricle. No adenopathy. No pneumothorax. IMPRESSION: Persistent changes of congestive heart failure, essentially stable compared to 1 day prior. Electronically Signed   By: Lowella Grip III M.D.   On:  05/17/2015 07:50   Dg Chest Port 1 View  05/14/2015  CLINICAL DATA:  Acute onset of shortness of breath. Initial encounter. EXAM: PORTABLE CHEST 1 VIEW COMPARISON:  Chest radiograph performed earlier today at 8:14 a.m. FINDINGS: The lungs are well-aerated. Small bilateral pleural effusions are seen. Vascular congestion is noted, with bilateral central airspace opacities, compatible with pulmonary edema. No pneumothorax is identified. The cardiomediastinal silhouette is borderline normal in size. A pacemaker is noted overlying the left chest wall, with leads ending overlying the right atrium and right ventricle. No acute osseous abnormalities are seen. IMPRESSION: Small bilateral pleural effusions seen. Vascular congestion, with bilateral central airspace opacities, compatible with pulmonary edema. Electronically Signed   By: Garald Balding M.D.   On: 05/14/2015 22:03   Dg Chest Portable 1 View  04/29/2015  CLINICAL DATA:  Dyspnea for 1 day. EXAM: PORTABLE CHEST 1 VIEW COMPARISON:  04/28/2015. FINDINGS: Borderline enlarged cardiac silhouette with a mild decrease in size. The interstitial markings remain prominent. Interval ill-defined opacity at both lung bases, greater on the right. Stable left subclavian pacemaker leads. Thoracic spine degenerative changes. IMPRESSION: 1. Bibasilar atelectasis or pneumonia. 2. Small right pleural effusion and possible small left pleural effusion. Electronically Signed   By: Claudie Revering M.D.   On: 04/24/2015 17:40   Dg Swallowing Func-speech Pathology  05/16/2015  Objective Swallowing Evaluation:   MBS Patient Details Name: GIRO POTTS, MD MRN: [id number] Date of Birth:  March 16, 1919 Today's Date: 05/16/2015 Time: SLP Start Time (ACUTE ONLY): 1337-SLP Stop Time (ACUTE ONLY): 1357 SLP Time Calculation (min) (ACUTE ONLY): 20 min Past Medical History: @PMH @ Past Surgical History: Past Surgical History Procedure Laterality Date . Lumbar laminectomy  1995 . Back surgery   . Total  knee arthroplasty Left  . Quadriceps tendon repair   . Cataract extraction   . Carotid endarterectomy   . Yag laser application Right [id number]   Procedure: YAG LASER CAPSULOTOMY OF RIGHT EYE;  Surgeon: [name], MD;  Location: [place];  Service: Ophthalmology;  Laterality: Right; . Tonsillectomy   . Pacemaker insertion     DDD St. Jude PM, initially placed in 1993 by Dr. Nils Pyle. Device upgrade 10/2002 to DDD, by Dr. Rollene Fare complicated by bleeding. . Carotid endarterectomy Right 2006 . Transurethral resection of prostate   . Cardioversion N/A 06/16/2013   Procedure: CARDIOVERSION BEDSIDE;  Surgeon: [name], MD;  Location: [facility];  Service: Cardiovascular;  Laterality: N/A; . Pacemaker generator change  12/09/2009   SJM Accent DR RF gen change by Dr [name] . Insert / replace / remove pacemaker     2004  . Transurethral resection of prostate N/A 08/27/2014   Procedure: TRANSURETHRAL RESECTION OF THE PROSTATE WITH GYRUS INSTRUMENTS;  Surgeon: [name], MD;  Location: [facility];  Service: Urology;  Laterality: N/A; . 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 PMH significant for CHF, CKD stage 4, HTN, DM2, pacemaker in June, recently admitted for HCAP earlier this month.  Now admitted with HCAP.  CXR revealed bilateral pleural effusions and pulmonary edema. Concerns for potential aspiration; coughing with meals evening of 12/31. MBS recommended following clinical observation with nectar thick liquids.  Subjective: Pleasant, confused (much different than baseline per [name], caregiver) Assessment / Plan / Recommendation CHL IP CLINICAL IMPRESSIONS 05/16/2015 Therapy Diagnosis Moderate oral phase dysphagia;Moderate pharyngeal phase dysphagia;Severe pharyngeal phase  dysphagia;Mild cervical esophageal phase dysphagia Clinical Impression Decreased oral cohesion, delayed transit and weak lingual to palatal contact leading to mild lingual base residue. Moderate-severe sensorimotor pharyngeal impairments including decreased sensation, reduced tongue base retraction, decreased laryngeal elevation and anterior hyolaryngeal excursion. Mild-max vallecular and pyriform sinsus residue with inability to elicit second swallow despite max verbal and tactile cues (dry spoon). Aspiration suspected after the swallow from max pyriform sinus residue indicated by sudden, strong reflexive cough when fluro turned off. Nectar thick flash penetrated x 1 and penetration with honey thick barium that remained in vestibule (mild) x 1. Pt is at significant aspiration risk given current confused state and inability to follow swallow precautions. Recommend Dys 1 and nectar thick liquids, sit upright, adequate sustained attention during meals, crush pills, No straws, small sips and full supervision.       Impact on safety and function Severe aspiration risk   CHL IP TREATMENT RECOMMENDATION 05/16/2015 Treatment Recommendations Therapy as outlined in treatment plan below   Prognosis 05/16/2015 Prognosis for Safe Diet Advancement (No Data) Barriers to Reach Goals Cognitive deficits Barriers/Prognosis Comment -- CHL IP DIET RECOMMENDATION 05/16/2015 SLP Diet Recommendations Dysphagia 1 (Puree) solids;Nectar thick liquid Liquid Administration via Cup;No straw Medication Administration Crushed with puree Compensations Minimize environmental distractions;Slow rate;Small sips/bites Postural Changes Seated upright at 90 degrees   CHL IP OTHER RECOMMENDATIONS 05/16/2015  Recommended Consults -- Oral Care Recommendations Oral care BID Other Recommendations --   CHL IP FOLLOW UP RECOMMENDATIONS 05/16/2015 Follow up Recommendations (No Data)   CHL IP FREQUENCY AND DURATION 05/16/2015 Speech Therapy Frequency (ACUTE ONLY) min 3x week  Treatment Duration 2 weeks      CHL IP ORAL PHASE 05/16/2015 Oral Phase Impaired Oral - Pudding Teaspoon -- Oral - Pudding Cup -- Oral - Honey Teaspoon -- Oral - Honey Cup Delayed oral transit;Weak lingual manipulation;Lingual/palatal residue;Decreased bolus cohesion Oral - Nectar Teaspoon Decreased bolus cohesion;Lingual/palatal residue;Weak lingual manipulation;Delayed oral transit Oral - Nectar Cup Delayed oral transit;Decreased bolus cohesion;Weak lingual manipulation;Lingual/palatal residue Oral - Nectar Straw -- Oral - Thin Teaspoon -- Oral - Thin Cup -- Oral - Thin Straw -- Oral - Puree Delayed oral transit Oral - Mech Soft -- Oral - Regular -- Oral - Multi-Consistency -- Oral - Pill -- Oral Phase - Comment --  CHL IP PHARYNGEAL PHASE 05/16/2015 Pharyngeal Phase Impaired Pharyngeal- Pudding Teaspoon -- Pharyngeal -- Pharyngeal- Pudding Cup -- Pharyngeal -- Pharyngeal- Honey Teaspoon -- Pharyngeal -- Pharyngeal- Honey Cup Delayed swallow initiation-pyriform sinuses;Pharyngeal residue - pyriform;Pharyngeal residue - valleculae;Reduced tongue base retraction;Reduced laryngeal elevation;Penetration/Apiration after swallow Pharyngeal Material enters airway, remains ABOVE vocal cords and not ejected out Pharyngeal- Nectar Teaspoon Delayed swallow initiation-pyriform sinuses;Penetration/Aspiration during swallow;Pharyngeal residue - valleculae;Pharyngeal residue - pyriform;Reduced laryngeal elevation;Reduced anterior laryngeal mobility;Reduced tongue base retraction Pharyngeal Material enters airway, remains ABOVE vocal cords then ejected out Pharyngeal- Nectar Cup Reduced anterior laryngeal mobility;Reduced laryngeal elevation;Pharyngeal residue - pyriform;Pharyngeal residue - valleculae;Reduced tongue base retraction Pharyngeal -- Pharyngeal- Nectar Straw -- Pharyngeal -- Pharyngeal- Thin Teaspoon -- Pharyngeal -- Pharyngeal- Thin Cup -- Pharyngeal -- Pharyngeal- Thin Straw -- Pharyngeal -- Pharyngeal- Puree Delayed  swallow initiation-vallecula Pharyngeal -- Pharyngeal- Mechanical Soft -- Pharyngeal -- Pharyngeal- Regular -- Pharyngeal -- Pharyngeal- Multi-consistency -- Pharyngeal -- Pharyngeal- Pill -- Pharyngeal -- Pharyngeal Comment --  CHL IP CERVICAL ESOPHAGEAL PHASE 05/16/2015 Cervical Esophageal Phase Impaired Pudding Teaspoon -- Pudding Cup -- Honey Teaspoon -- Honey Cup -- Nectar Teaspoon -- Nectar Cup -- Nectar Straw -- Thin Teaspoon -- Thin Cup -- Thin Straw -- Puree -- Mechanical Soft -- Regular -- Multi-consistency -- Pill -- Cervical Esophageal Comment -- [place] Siren 05/16/2015, 2:56 PM [name] Litaker M.Ed CCC-SLP Pager [phone]              Dg Fluoro Guide Ndl Plcd/bx/inj/loc  04/25/2015  CLINICAL DATA:  Right hip pain. EXAM: RIGHT HIP INJECTION UNDER FLUOROSCOPY FLUOROSCOPY TIME:  Fluoroscopy Time (in minutes and seconds): 1 MINUTE 48 SECONDS PROCEDURE: Informed consent was obtained from Dr. Ellsworth Lennox and his accompanying friend. Dr. Ellsworth Lennox reported that his friend has power of attorney for him. Overlying skin prepped with Betadine, draped in the usual sterile fashion, and infiltrated locally with buffered Lidocaine. Curved 20 gauge spinal needle advanced to the superolateral margin of the right femoral head. 1 ml of Lidocaine injected easily. Diagnostic injection of iodinated contrast demonstrates intra-articular spread without intravascular component. 120mg  Depo-Medrol and 5 ml Sensorcaine 0.5% were then administered. No immediate complication. IMPRESSION: Technically successful right hip injection under fluoroscopy. Dr. Kennon Holter tolerated the procedure well. Electronically Signed   By: Lorriane Shire M.D.   On: 04/25/2015 13:02      ASSESSMENT: chronic diastolic heart failure  PLAN:  1) Continue comfort care measures.  I spoke to the family at length.  They feel that the morphine was more effective at relieving the [facility] than IV Lasix.  I  explained that at this point, we would not be checking  kidney function with blood tests.  He is comfortable now with a face mask.  Continue IV morphine as needed for [facility] as this will help with pain and with relieving vascular congestion.  All of his children present were in agreement with the plan for comfort care and hospice.  Jettie Booze, MD  05/19/2015  11:39 AM

## 2015-05-25 ENCOUNTER — Ambulatory Visit: Payer: Medicare Other | Admitting: Interventional Cardiology

## 2015-06-15 NOTE — Care Management Important Message (Signed)
Important Message  Patient Details  Name: Darrell STROHMEYER, MD MRN: [id number] Date of Birth: [birth year]   Medicare Important Message Given:  Yes    Nathen May 2015/06/16, 11:56 AM

## 2015-06-15 NOTE — Discharge Summary (Signed)
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 PMH significant for CHF, CKD stage 4, HTN, DM2, pacemaker in June, recently admitted for HCAP earlier this month. He was treated with IV abx, switched to levaquin, and discharged to [facility]. At [facility] he was continued on levaquin, but developed acute worsening dyspnea today, sent to ED for respiratory distress and O2 sat in the 80s. Started on treatment for HCAP with broad spectrum antibiotics and after 3days of IV, transition to PO Augmentin. No fever, no WBC's and no frank infiltrates on CXR. Has acute on chronic CHF exacerbation and struggling to respond to ongoing diuretics treatment (limited by chronic renal failure). Cardiology has been following during hospital stay, patient transferred to stepdown where he required BiPAP. Palliative care consulted. After family discussion with patient primary cardiologist [name], and palliative care, her goals were towards comfort measures, and plan was for discharge to [facility] in a.m., the patient passed away at 5:35 AM on May 31, 2015. Phillips Climes MD

## 2015-06-15 NOTE — Progress Notes (Signed)
Received desat alarm. Upon entering room, two nurses were at bedside assessing patient. Patient noted to have no respirations and when auscultating, no heart beats. Patient was pronounced expired by Bethena Roys, RN and Renita Papa RN at [phone]. Family at bedside.

## 2015-06-15 NOTE — Care Management Note (Signed)
Case Management Note Previous CM note initiated by Jacqlyn Krauss RN, CM  Patient Details  Name: Darrell LATIMORE, MD MRN: [id number] Date of Birth: [birth year]  Subjective/Objective:    Pt admitted from [facility] for HCAP- Pt is a transfer to [facility].                 Action/Plan: CSW to assist with disposition needs. CM will continue to monitor for additional needs.   Expected Discharge Date:   05/26/15               Expected Discharge Plan:  Skilled Nursing Facility  In-House Referral:  Clinical Social Work  Discharge planning Services  CM Consult  Post Acute Care Choice:  NA Choice offered to:  NA  DME Arranged:  N/A DME Agency:  NA  HH Arranged:  NA HH Agency:  NA  Status of Service:  Completed, signed off  Medicare Important Message Given:  Yes Date Medicare IM Given:    Medicare IM give by:    Date Additional Medicare IM Given:    Additional Medicare Important Message give by:     If discussed at [place] of Stay Meetings, dates discussed:    Discharge Disposition: Expired   Additional Comments: Palliative Care is following pt and the plan will be to transition to [facility]. CSW assisting with disposition needs. No further needs from CM at this time.   05-26-15- Marvetta Gibbons RN, BSN- pt expired early this am, family now at bedside- funeral home here to pick up body. CSW aware- no further needs.   Dawayne Patricia, RN 05-26-2015, 10:15 AM

## 2015-06-15 DEATH — deceased

## 2016-06-29 IMAGING — CT CT HEAD W/O CM
1 of 6 series · 7 of 47 positions shown, 9 images · non-contrast
Comparison: Head CT scan 10/04/2014. Head and cervical spine CT
scans 03/10/2012.

CLINICAL DATA: Dizziness. Status post fall 2 days ago. Initial
encounter.

EXAM:
CT HEAD WITHOUT CONTRAST
CT CERVICAL SPINE WITHOUT CONTRAST
TECHNIQUE: Multidetector CT imaging of the head and cervical spine was
performed following the standard protocol without intravenous
contrast. Multiplanar CT image reconstructions of the cervical spine
were also generated.

[Series 306: orthogonal · axial · 0.35mm/px · z∈[+108,+226]mm · 7 of 83 slices shown, 9 images]
[im 11/83  brain]
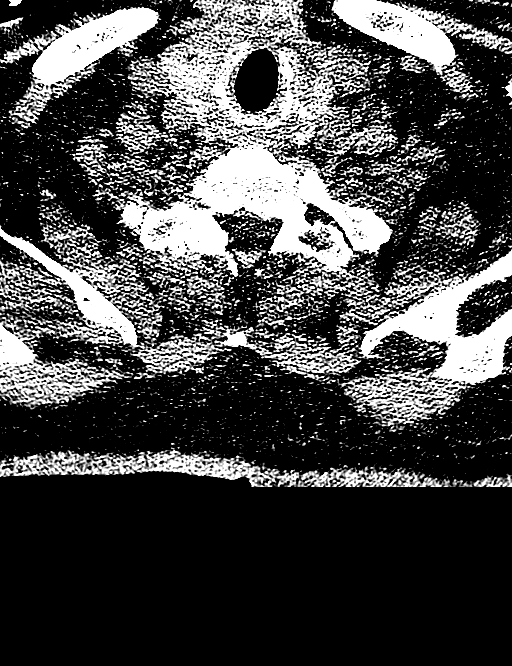
[im 11/83  bone]
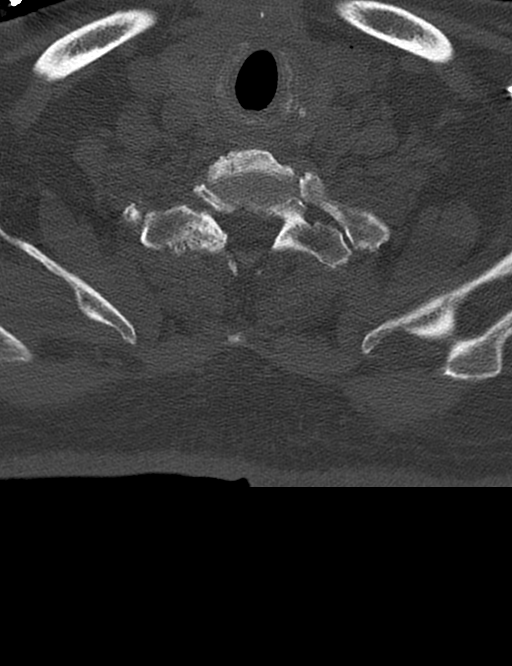
[im 21/83  brain]
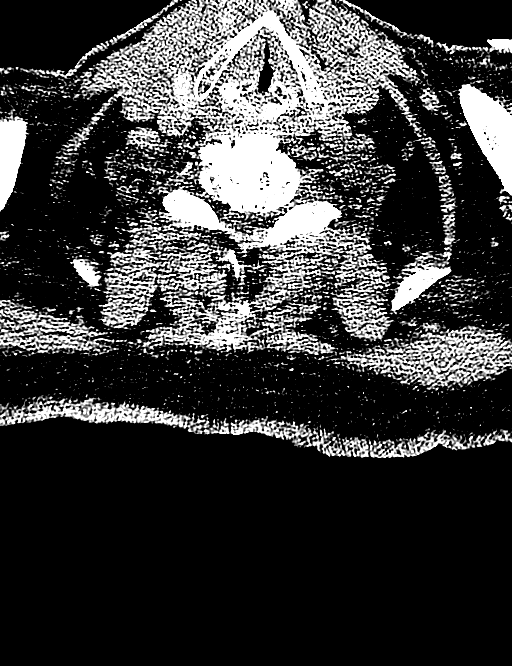
[im 31/83  brain]
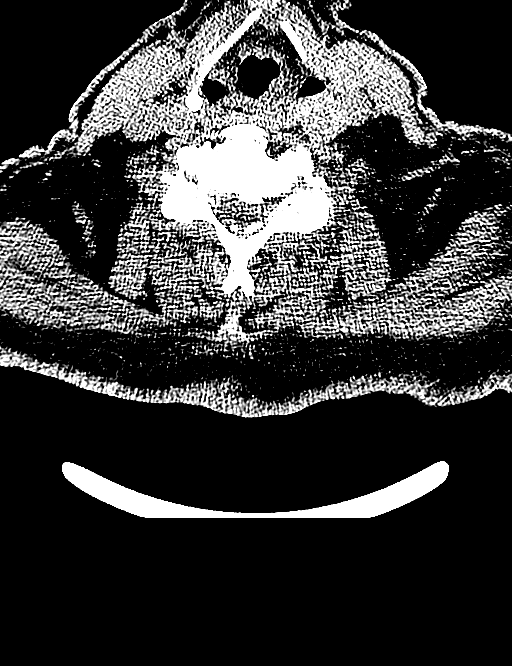
[im 42/83  brain]
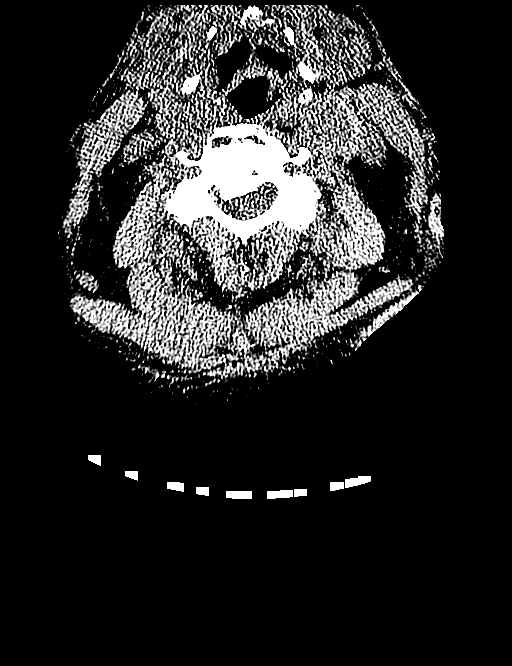
[im 52/83  brain]
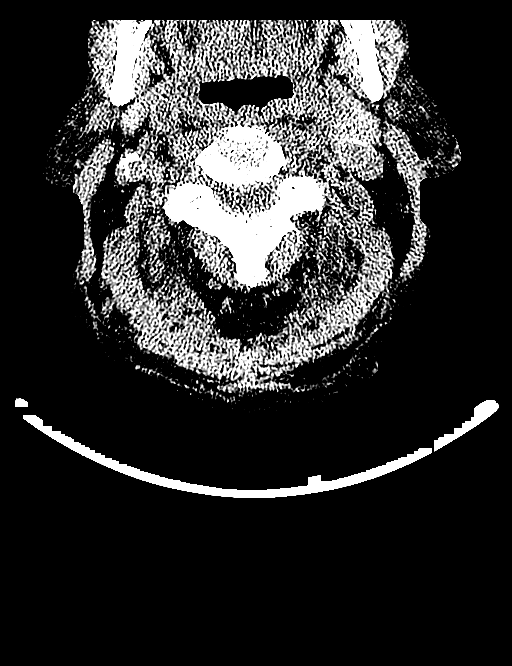
[im 52/83  bone]
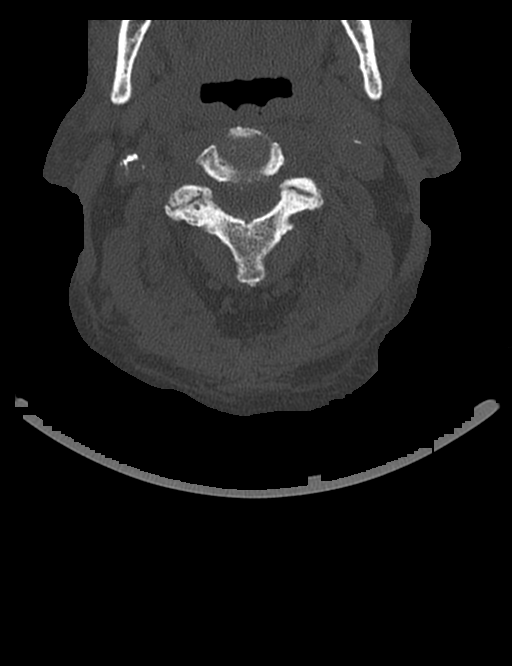
[im 62/83  brain]
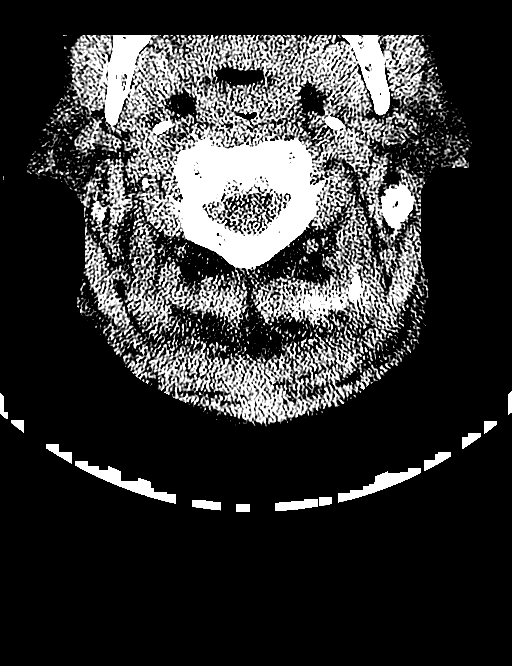
[im 72/83  brain]
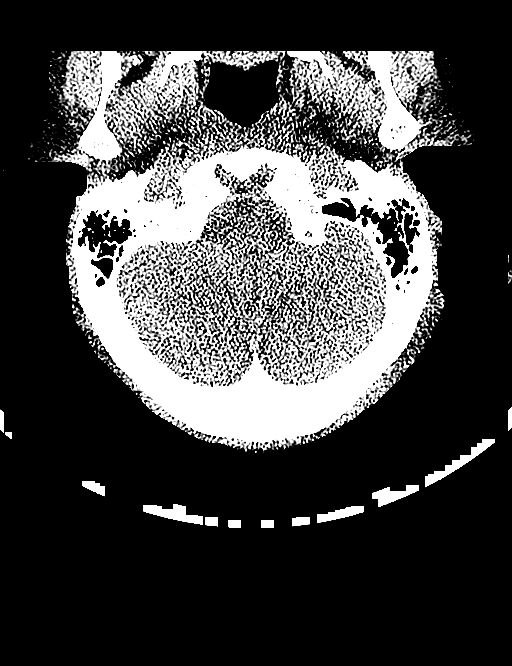

[7 of 47 positions shown; findings below may reference images not displayed]

FINDINGS: CT HEAD FINDINGS

Cortical atrophy and chronic microvascular ischemic change are again
seen. There is no evidence of acute intracranial abnormality
including hemorrhage, infarct, mass lesion, mass effect, midline
shift or abnormal extra-axial collection. Minimal mucosal thickening
left maxillary sinus is noted. The calvarium is intact.

CT CERVICAL SPINE FINDINGS

There is straightening of the normal cervical lordosis. No fracture
or malalignment is identified. Marked multilevel degenerative
disease appearing worst at C3-4 and C5-C7 is again seen.
Ossification of the posterior longitudinal ligament is also again
identified. Multilevel facet arthropathy is seen. Lung apices
demonstrate a layering pleural effusion on the left. The thyroid
gland appears somewhat enlarged and heterogeneous.
IMPRESSION: No acute abnormality head or cervical spine.

Layering pleural effusion on the left.

Atrophy and chronic microvascular ischemic change.

Multilevel cervical spondylosis.
